# Patient Record
Sex: Female | Born: 1955 | Race: White | Hispanic: No | Smoking: Former smoker
Health system: Southern US, Community
[De-identification: ages and names within clinical notes are randomized; demographics above are authoritative.]

## PROBLEM LIST (undated history)

## (undated) DIAGNOSIS — Z952 Presence of prosthetic heart valve: Secondary | ICD-10-CM

## (undated) DIAGNOSIS — I493 Ventricular premature depolarization: Secondary | ICD-10-CM

## (undated) DIAGNOSIS — J189 Pneumonia, unspecified organism: Secondary | ICD-10-CM

## (undated) DIAGNOSIS — D696 Thrombocytopenia, unspecified: Secondary | ICD-10-CM

## (undated) DIAGNOSIS — I5032 Chronic diastolic (congestive) heart failure: Principal | ICD-10-CM

## (undated) DIAGNOSIS — R6510 Systemic inflammatory response syndrome (SIRS) of non-infectious origin without acute organ dysfunction: Principal | ICD-10-CM

## (undated) DIAGNOSIS — R0902 Hypoxemia: Secondary | ICD-10-CM

## (undated) DIAGNOSIS — R7881 Bacteremia: Secondary | ICD-10-CM

## (undated) DIAGNOSIS — I509 Heart failure, unspecified: Principal | ICD-10-CM

## (undated) DIAGNOSIS — J45909 Unspecified asthma, uncomplicated: Secondary | ICD-10-CM

## (undated) DIAGNOSIS — N179 Acute kidney failure, unspecified: Secondary | ICD-10-CM

## (undated) DIAGNOSIS — G4733 Obstructive sleep apnea (adult) (pediatric): Secondary | ICD-10-CM

## (undated) DIAGNOSIS — E119 Type 2 diabetes mellitus without complications: Secondary | ICD-10-CM

## (undated) DIAGNOSIS — D649 Anemia, unspecified: Secondary | ICD-10-CM

## (undated) DIAGNOSIS — I519 Heart disease, unspecified: Secondary | ICD-10-CM

## (undated) DIAGNOSIS — D493 Neoplasm of unspecified behavior of breast: Secondary | ICD-10-CM

## (undated) DIAGNOSIS — N189 Chronic kidney disease, unspecified: Secondary | ICD-10-CM

## (undated) DIAGNOSIS — Z950 Presence of cardiac pacemaker: Secondary | ICD-10-CM

## (undated) DIAGNOSIS — E039 Hypothyroidism, unspecified: Secondary | ICD-10-CM

## (undated) DIAGNOSIS — N200 Calculus of kidney: Secondary | ICD-10-CM

## (undated) DIAGNOSIS — J449 Chronic obstructive pulmonary disease, unspecified: Secondary | ICD-10-CM

## (undated) DIAGNOSIS — I5033 Acute on chronic diastolic (congestive) heart failure: Secondary | ICD-10-CM

## (undated) DIAGNOSIS — K7460 Unspecified cirrhosis of liver: Secondary | ICD-10-CM

## (undated) DIAGNOSIS — R011 Cardiac murmur, unspecified: Secondary | ICD-10-CM

## (undated) DIAGNOSIS — K7581 Nonalcoholic steatohepatitis (NASH): Secondary | ICD-10-CM

## (undated) HISTORY — DX: Type 2 diabetes mellitus without complications: E11.9

## (undated) HISTORY — PX: PACEMAKER INSERTION: SHX728

## (undated) HISTORY — DX: Heart disease, unspecified: I51.9

## (undated) HISTORY — DX: Neoplasm of unspecified behavior of breast: D49.3

## (undated) HISTORY — DX: Calculus of kidney: N20.0

## (undated) HISTORY — DX: Unspecified asthma, uncomplicated: J45.909

## (undated) HISTORY — PX: BACK SURGERY: SHX140

---
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
## 2005-01-28 HISTORY — PX: CHOLECYSTECTOMY: SHX55

## 2006-01-28 HISTORY — PX: OTHER SURGICAL HISTORY: SHX169

## 2007-01-29 HISTORY — PX: PHRENIC NERVE PACEMAKER IMPLANTATION: SHX2237

## 2009-03-24 ENCOUNTER — Ambulatory Visit: Payer: Self-pay | Admitting: Internal Medicine

## 2009-09-06 ENCOUNTER — Encounter: Payer: Self-pay | Admitting: Internal Medicine

## 2009-09-28 ENCOUNTER — Encounter: Payer: Self-pay | Admitting: Internal Medicine

## 2009-11-07 ENCOUNTER — Ambulatory Visit: Payer: Self-pay | Admitting: Rheumatology

## 2010-01-28 DIAGNOSIS — E119 Type 2 diabetes mellitus without complications: Secondary | ICD-10-CM

## 2010-01-28 HISTORY — DX: Type 2 diabetes mellitus without complications: E11.9

## 2010-02-28 ENCOUNTER — Emergency Department: Payer: Self-pay

## 2010-03-05 ENCOUNTER — Ambulatory Visit: Payer: Self-pay | Admitting: Internal Medicine

## 2010-03-15 ENCOUNTER — Emergency Department: Payer: Self-pay | Admitting: Emergency Medicine

## 2010-06-19 ENCOUNTER — Ambulatory Visit: Payer: Self-pay | Admitting: Internal Medicine

## 2010-09-24 ENCOUNTER — Emergency Department: Payer: Self-pay | Admitting: *Deleted

## 2011-01-29 DIAGNOSIS — D493 Neoplasm of unspecified behavior of breast: Secondary | ICD-10-CM

## 2011-01-29 HISTORY — DX: Neoplasm of unspecified behavior of breast: D49.3

## 2011-04-24 HISTORY — PX: BREAST BIOPSY: SHX20

## 2011-05-07 ENCOUNTER — Ambulatory Visit: Payer: Self-pay | Admitting: Emergency Medicine

## 2011-05-10 LAB — PATHOLOGY REPORT

## 2011-07-05 ENCOUNTER — Emergency Department: Payer: Self-pay | Admitting: *Deleted

## 2011-07-05 LAB — COMPREHENSIVE METABOLIC PANEL
Albumin: 3.3 g/dL — ABNORMAL LOW (ref 3.4–5.0)
Alkaline Phosphatase: 76 U/L (ref 50–136)
BUN: 12 mg/dL (ref 7–18)
Bilirubin,Total: 1 mg/dL (ref 0.2–1.0)
Calcium, Total: 8.9 mg/dL (ref 8.5–10.1)
Chloride: 102 mmol/L (ref 98–107)
Co2: 30 mmol/L (ref 21–32)
Creatinine: 1.1 mg/dL (ref 0.60–1.30)
EGFR (Non-African Amer.): 57 — ABNORMAL LOW
Potassium: 4 mmol/L (ref 3.5–5.1)
SGOT(AST): 32 U/L (ref 15–37)
Total Protein: 7.6 g/dL (ref 6.4–8.2)

## 2011-07-05 LAB — CBC
HGB: 10.9 g/dL — ABNORMAL LOW (ref 12.0–16.0)
RBC: 3.61 10*6/uL — ABNORMAL LOW (ref 3.80–5.20)
RDW: 14.8 % — ABNORMAL HIGH (ref 11.5–14.5)
WBC: 7 10*3/uL (ref 3.6–11.0)

## 2011-07-05 LAB — TROPONIN I: Troponin-I: <0.02 ng/mL

## 2011-07-05 LAB — PRO B NATRIURETIC PEPTIDE: B-Type Natriuretic Peptide: 652 pg/mL — ABNORMAL HIGH (ref 0–125)

## 2011-07-05 LAB — CK TOTAL AND CKMB (NOT AT ARMC)
CK, Total: 49 U/L (ref 21–215)
CK-MB: 0.9 ng/mL (ref 0.5–3.6)

## 2011-09-11 ENCOUNTER — Ambulatory Visit: Payer: Self-pay | Admitting: Internal Medicine

## 2011-09-17 DIAGNOSIS — D242 Benign neoplasm of left breast: Secondary | ICD-10-CM | POA: Insufficient documentation

## 2011-09-25 HISTORY — PX: BREAST SURGERY: SHX581

## 2012-01-03 ENCOUNTER — Other Ambulatory Visit: Payer: Self-pay | Admitting: Cardiovascular Disease

## 2012-01-03 ENCOUNTER — Ambulatory Visit: Payer: Self-pay | Admitting: Cardiovascular Disease

## 2012-01-03 LAB — POTASSIUM: Potassium: 4.1 mmol/L (ref 3.5–5.1)

## 2012-01-03 LAB — HEMATOCRIT: HCT: 32.5 % — ABNORMAL LOW (ref 35.0–47.0)

## 2012-01-03 LAB — CREATININE, SERUM
Creatinine: 1.41 mg/dL — ABNORMAL HIGH (ref 0.60–1.30)
EGFR (African American): 49 — ABNORMAL LOW

## 2012-01-06 ENCOUNTER — Ambulatory Visit: Payer: Self-pay | Admitting: Cardiovascular Disease

## 2012-03-28 ENCOUNTER — Encounter: Payer: Self-pay | Admitting: *Deleted

## 2012-05-05 ENCOUNTER — Ambulatory Visit: Payer: Self-pay | Admitting: General Surgery

## 2012-05-06 ENCOUNTER — Encounter: Payer: Self-pay | Admitting: General Surgery

## 2012-05-14 ENCOUNTER — Ambulatory Visit: Payer: Self-pay | Admitting: General Surgery

## 2012-06-29 ENCOUNTER — Ambulatory Visit: Payer: Self-pay | Admitting: General Surgery

## 2012-07-27 ENCOUNTER — Ambulatory Visit (INDEPENDENT_AMBULATORY_CARE_PROVIDER_SITE_OTHER): Payer: Medicare Other | Admitting: General Surgery

## 2012-07-27 ENCOUNTER — Encounter: Payer: Self-pay | Admitting: General Surgery

## 2012-07-27 VITALS — BP 122/66 | HR 66 | Resp 15 | Ht 67.5 in | Wt 353.0 lb

## 2012-07-27 DIAGNOSIS — D249 Benign neoplasm of unspecified breast: Secondary | ICD-10-CM

## 2012-07-27 DIAGNOSIS — D242 Benign neoplasm of left breast: Secondary | ICD-10-CM

## 2012-07-27 NOTE — Progress Notes (Signed)
Patient ID: Theresa Leon, female   DOB: [birth year], 56 y.o.   MRN: [id number]  Chief Complaint  Patient presents with  . Follow-up    mammogram    HPI Theresa Leon is a 57 y.o. female  6 month follow up mammogram [facility] 05/05/12. Patient had a Left breast biopsy August 2013 That was negative for malignancy. The patient performs self breast checks and get regular mammograms. She states she has right breast pain occasionally. The patient has a family history of breast cancer in a maternal grandmother. The most recent mammogram was done on 05/05/12 with a birad category 1.   HPI  Past Medical History  Diagnosis Date  . Asthma 1975  . Diabetes mellitus without complication 1478  . Heart disease   . Neoplasm of unspecified nature of breast 2013    papilloma, left breast     Past Surgical History  Procedure Laterality Date  . Back surgery      AS CHILD  . Phrenic nerve pacemaker implantation  2009  . Aortic valve relaced   2008  . Cholecystectomy  2007  . Breast surgery  September 25, 2011    intraductal papilloma of the left breast    Family History  Problem Relation Age of Onset  . Skin telangiectasia Mother   . Lung cancer Maternal Grandmother   . Lung cancer Paternal Grandmother   . Breast cancer Maternal Grandmother     Social History History  Substance Use Topics  . Smoking status: Former Research scientist (life sciences)  . Smokeless tobacco: Not on file  . Alcohol Use: No    Allergies  Allergen Reactions  . Mold Extract (Trichophyton)   . Nitroglycerin Other (See Comments)    Low Blood Pressure    Current Outpatient Prescriptions  Medication Sig Dispense Refill  . albuterol (PROVENTIL HFA;VENTOLIN HFA) 108 (90 BASE) MCG/ACT inhaler Inhale 2 puffs into the lungs every 6 (six) hours as needed for wheezing.      . Calcium Carbonate-Vitamin D (CALCIUM + D PO) Take 1 tablet by mouth daily.      . citalopram (CELEXA) 20 MG tablet Take 1 tablet by mouth 2 (two) times daily.      . diclofenac  (VOLTAREN) 75 MG EC tablet Take 75 mg by mouth 2 (two) times daily.      Marland Kitchen EPINEPHrine (EPIPEN) 0.3 mg/0.3 mL DEVI Inject into the muscle once.      . fluticasone (FLONASE) 50 MCG/ACT nasal spray Place 2 sprays into the nose daily as needed.      . gabapentin (NEURONTIN) 600 MG tablet Take 600 mg by mouth 3 (three) times daily.      . hydrochlorothiazide (HYDRODIURIL) 25 MG tablet Take 1 tablet by mouth daily.      Marland Kitchen HYDROcodone-acetaminophen (NORCO/VICODIN) 5-325 MG per tablet Take 1 tablet by mouth 2 (two) times daily.      Marland Kitchen levothyroxine (SYNTHROID, LEVOTHROID) 100 MCG tablet Take 100 mcg by mouth daily.      Marland Kitchen loratadine (CLARITIN) 10 MG tablet Take 10 mg by mouth daily.      . meclizine (ANTIVERT) 25 MG tablet Take 1 tablet by mouth every 6 (six) hours.      . montelukast (SINGULAIR) 10 MG tablet Take 10 mg by mouth at bedtime.      Marland Kitchen omeprazole (PRILOSEC) 40 MG capsule Take 40 mg by mouth daily.      . ranitidine (ZANTAC) 150 MG capsule Take 150 mg by mouth 2 (two) times daily.      Marland Kitchen  SOTALOL AF 80 MG TABS Take 1 tablet by mouth 2 (two) times daily.      . traZODone (DESYREL) 50 MG tablet Take 1 tablet by mouth at bedtime as needed.       No current facility-administered medications for this visit.    Review of Systems Review of Systems  Constitutional: Negative.   Respiratory: Negative.   Cardiovascular: Negative.     Blood pressure 122/66, pulse 66, resp. rate 15, height 5' 7.5" (1.715 m), weight 353 lb (160.12 kg).  Physical Exam Physical Exam  Constitutional: She appears well-developed and well-nourished.  Pulmonary/Chest: Right breast exhibits no inverted nipple, no mass, no nipple discharge, no skin change and no tenderness. Left breast exhibits no inverted nipple, no mass, no nipple discharge, no skin change and no tenderness.  Lymphadenopathy:    She has no cervical adenopathy.    She has no axillary adenopathy.  Neurological: She is alert.  Skin: Skin is warm and  dry.    Data Reviewed The May 05, 2012 left breast mammogram was reviewed. The retroareolar density previous identified as markedly diminished. Postbiopsy clip was evident. BI-RAD-1.  Assessment    Intraductal palpable left breast.     Plan    The patient should resume annual bilateral screening mammograms in fall 2014 with her primary care provider.        Robert Bellow 07/27/2012, 8:30 PM

## 2012-07-27 NOTE — Patient Instructions (Signed)
Patient to return as needed. 

## 2013-04-27 LAB — CBC
HCT: 35 % (ref 35.0–47.0)
HGB: 11.6 g/dL — ABNORMAL LOW (ref 12.0–16.0)
MCH: 29.8 pg (ref 26.0–34.0)
MCHC: 33 g/dL (ref 32.0–36.0)
MCV: 90 fL (ref 80–100)
Platelet: 125 10*3/uL — ABNORMAL LOW (ref 150–440)
RBC: 3.88 10*6/uL (ref 3.80–5.20)
RDW: 14.7 % — ABNORMAL HIGH (ref 11.5–14.5)
WBC: 9.7 10*3/uL (ref 3.6–11.0)

## 2013-04-27 LAB — URINALYSIS, COMPLETE
Bacteria: NONE SEEN
Bilirubin,UR: NEGATIVE
Blood: NEGATIVE
GLUCOSE, UR: NEGATIVE mg/dL (ref 0–75)
Ketone: NEGATIVE
Nitrite: NEGATIVE
Ph: 5 (ref 4.5–8.0)
Protein: 30
RBC,UR: NONE SEEN /HPF (ref 0–5)
Specific Gravity: 1.015 (ref 1.003–1.030)
WBC UR: 9 /HPF (ref 0–5)

## 2013-04-27 LAB — PRO B NATRIURETIC PEPTIDE: B-Type Natriuretic Peptide: 314 pg/mL — ABNORMAL HIGH (ref 0–125)

## 2013-04-27 LAB — HEPATIC FUNCTION PANEL A (ARMC)
ALK PHOS: 74 U/L
ALT: 32 U/L (ref 12–78)
AST: 33 U/L (ref 15–37)
Albumin: 3.4 g/dL (ref 3.4–5.0)
BILIRUBIN DIRECT: 0.2 mg/dL (ref 0.00–0.20)
Bilirubin,Total: 1.4 mg/dL — ABNORMAL HIGH (ref 0.2–1.0)
Total Protein: 8.1 g/dL (ref 6.4–8.2)

## 2013-04-27 LAB — LIPASE, BLOOD: LIPASE: 319 U/L (ref 73–393)

## 2013-04-27 LAB — BASIC METABOLIC PANEL
ANION GAP: 5 — AB (ref 7–16)
BUN: 33 mg/dL — ABNORMAL HIGH (ref 7–18)
Calcium, Total: 9.2 mg/dL (ref 8.5–10.1)
Chloride: 97 mmol/L — ABNORMAL LOW (ref 98–107)
Co2: 32 mmol/L (ref 21–32)
Creatinine: 3.22 mg/dL — ABNORMAL HIGH (ref 0.60–1.30)
EGFR (Non-African Amer.): 15 — ABNORMAL LOW
GFR CALC AF AMER: 18 — AB
Glucose: 176 mg/dL — ABNORMAL HIGH (ref 65–99)
Osmolality: 280 (ref 275–301)
POTASSIUM: 3.9 mmol/L (ref 3.5–5.1)
Sodium: 134 mmol/L — ABNORMAL LOW (ref 136–145)

## 2013-04-27 LAB — PROTIME-INR
INR: 1.1
Prothrombin Time: 14 secs (ref 11.5–14.7)

## 2013-04-27 LAB — TROPONIN I: Troponin-I: <0.02 ng/mL

## 2013-04-28 ENCOUNTER — Inpatient Hospital Stay: Payer: Self-pay | Admitting: Internal Medicine

## 2013-04-28 LAB — CK-MB
CK-MB: 0.9 ng/mL (ref 0.5–3.6)
CK-MB: 1.2 ng/mL (ref 0.5–3.6)

## 2013-04-28 LAB — MAGNESIUM
Magnesium: 1.1 mg/dL — ABNORMAL LOW
Magnesium: 2 mg/dL

## 2013-04-28 LAB — TROPONIN I: Troponin-I: <0.02 ng/mL

## 2013-04-28 LAB — GLUCOSE, RANDOM: Glucose: 210 mg/dL — ABNORMAL HIGH (ref 65–99)

## 2013-04-28 LAB — POTASSIUM: POTASSIUM: 4.3 mmol/L (ref 3.5–5.1)

## 2013-04-29 LAB — BASIC METABOLIC PANEL
ANION GAP: 5 — AB (ref 7–16)
BUN: 22 mg/dL — ABNORMAL HIGH (ref 7–18)
CALCIUM: 8.8 mg/dL (ref 8.5–10.1)
CHLORIDE: 100 mmol/L (ref 98–107)
CO2: 32 mmol/L (ref 21–32)
CREATININE: 1.51 mg/dL — AB (ref 0.60–1.30)
EGFR (African American): 44 — ABNORMAL LOW
EGFR (Non-African Amer.): 38 — ABNORMAL LOW
GLUCOSE: 175 mg/dL — AB (ref 65–99)
Osmolality: 281 (ref 275–301)
Potassium: 4.3 mmol/L (ref 3.5–5.1)
Sodium: 137 mmol/L (ref 136–145)

## 2013-04-29 LAB — UR PROT ELECTROPHORESIS, URINE RANDOM

## 2013-04-29 LAB — CBC WITH DIFFERENTIAL/PLATELET
BASOS PCT: 0.5 %
Basophil #: 0 10*3/uL (ref 0.0–0.1)
Eosinophil #: 0.2 10*3/uL (ref 0.0–0.7)
Eosinophil %: 3.4 %
HCT: 32.5 % — AB (ref 35.0–47.0)
HGB: 10.8 g/dL — ABNORMAL LOW (ref 12.0–16.0)
LYMPHS PCT: 45.3 %
Lymphocyte #: 3.2 10*3/uL (ref 1.0–3.6)
MCH: 29.8 pg (ref 26.0–34.0)
MCHC: 33.2 g/dL (ref 32.0–36.0)
MCV: 90 fL (ref 80–100)
MONO ABS: 0.5 x10 3/mm (ref 0.2–0.9)
Monocyte %: 7.2 %
Neutrophil #: 3.1 10*3/uL (ref 1.4–6.5)
Neutrophil %: 43.6 %
Platelet: 103 10*3/uL — ABNORMAL LOW (ref 150–440)
RBC: 3.61 10*6/uL — ABNORMAL LOW (ref 3.80–5.20)
RDW: 14.5 % (ref 11.5–14.5)
WBC: 7.2 10*3/uL (ref 3.6–11.0)

## 2013-04-29 LAB — MAGNESIUM: MAGNESIUM: 1.7 mg/dL — AB

## 2013-04-29 LAB — PROTEIN ELECTROPHORESIS(ARMC)

## 2013-07-13 ENCOUNTER — Inpatient Hospital Stay: Payer: Self-pay | Admitting: Internal Medicine

## 2013-07-13 LAB — CK TOTAL AND CKMB (NOT AT ARMC)
CK, Total: 24 U/L — ABNORMAL LOW
CK, Total: 24 U/L — ABNORMAL LOW
CK, Total: 23 U/L — ABNORMAL LOW
CK-MB: <0.5 ng/mL — ABNORMAL LOW (ref 0.5–3.6)
CK-MB: 0.7 ng/mL (ref 0.5–3.6)
CK-MB: 0.6 ng/mL (ref 0.5–3.6)

## 2013-07-13 LAB — COMPREHENSIVE METABOLIC PANEL
Albumin: 3.3 g/dL — ABNORMAL LOW (ref 3.4–5.0)
Alkaline Phosphatase: 58 U/L
Anion Gap: 5 — ABNORMAL LOW (ref 7–16)
BILIRUBIN TOTAL: 1 mg/dL (ref 0.2–1.0)
BUN: 47 mg/dL — AB (ref 7–18)
CO2: 26 mmol/L (ref 21–32)
Calcium, Total: 8.3 mg/dL — ABNORMAL LOW (ref 8.5–10.1)
Chloride: 107 mmol/L (ref 98–107)
Creatinine: 3.44 mg/dL — ABNORMAL HIGH (ref 0.60–1.30)
GFR CALC AF AMER: 16 — AB
GFR CALC NON AF AMER: 14 — AB
Glucose: 151 mg/dL — ABNORMAL HIGH (ref 65–99)
Osmolality: 291 (ref 275–301)
Potassium: 5.4 mmol/L — ABNORMAL HIGH (ref 3.5–5.1)
SGOT(AST): 30 U/L (ref 15–37)
SGPT (ALT): 30 U/L (ref 12–78)
Sodium: 138 mmol/L (ref 136–145)
Total Protein: 7 g/dL (ref 6.4–8.2)

## 2013-07-13 LAB — CBC
HCT: 32.6 % — ABNORMAL LOW (ref 35.0–47.0)
HGB: 10.5 g/dL — AB (ref 12.0–16.0)
MCH: 30.4 pg (ref 26.0–34.0)
MCHC: 32.2 g/dL (ref 32.0–36.0)
MCV: 95 fL (ref 80–100)
Platelet: 111 10*3/uL — ABNORMAL LOW (ref 150–440)
RBC: 3.44 10*6/uL — ABNORMAL LOW (ref 3.80–5.20)
RDW: 16.1 % — ABNORMAL HIGH (ref 11.5–14.5)
WBC: 8.9 10*3/uL (ref 3.6–11.0)

## 2013-07-13 LAB — TROPONIN I
Troponin-I: <0.02 ng/mL
Troponin-I: <0.02 ng/mL
Troponin-I: <0.02 ng/mL

## 2013-07-13 LAB — PROTIME-INR
INR: 1.1
PROTHROMBIN TIME: 14.3 s (ref 11.5–14.7)

## 2013-07-13 LAB — APTT: ACTIVATED PTT: 32.8 s (ref 23.6–35.9)

## 2013-07-14 LAB — CBC WITH DIFFERENTIAL/PLATELET
Basophil #: 0.1 10*3/uL (ref 0.0–0.1)
Basophil %: 1.2 %
Eosinophil #: 0.4 10*3/uL (ref 0.0–0.7)
Eosinophil %: 5.1 %
HCT: 30.6 % — ABNORMAL LOW (ref 35.0–47.0)
HGB: 10 g/dL — AB (ref 12.0–16.0)
LYMPHS PCT: 47.3 %
Lymphocyte #: 3.6 10*3/uL (ref 1.0–3.6)
MCH: 31.1 pg (ref 26.0–34.0)
MCHC: 32.5 g/dL (ref 32.0–36.0)
MCV: 96 fL (ref 80–100)
MONO ABS: 0.6 x10 3/mm (ref 0.2–0.9)
Monocyte %: 7.3 %
NEUTROS ABS: 3 10*3/uL (ref 1.4–6.5)
NEUTROS PCT: 39.1 %
Platelet: 93 10*3/uL — ABNORMAL LOW (ref 150–440)
RBC: 3.2 10*6/uL — ABNORMAL LOW (ref 3.80–5.20)
RDW: 15.8 % — ABNORMAL HIGH (ref 11.5–14.5)
WBC: 7.7 10*3/uL (ref 3.6–11.0)

## 2013-07-14 LAB — BASIC METABOLIC PANEL
ANION GAP: 5 — AB (ref 7–16)
BUN: 41 mg/dL — ABNORMAL HIGH (ref 7–18)
Calcium, Total: 7.6 mg/dL — ABNORMAL LOW (ref 8.5–10.1)
Chloride: 109 mmol/L — ABNORMAL HIGH (ref 98–107)
Co2: 26 mmol/L (ref 21–32)
Creatinine: 2.39 mg/dL — ABNORMAL HIGH (ref 0.60–1.30)
EGFR (Non-African Amer.): 22 — ABNORMAL LOW
GFR CALC AF AMER: 25 — AB
Glucose: 109 mg/dL — ABNORMAL HIGH (ref 65–99)
OSMOLALITY: 290 (ref 275–301)
Potassium: 5.5 mmol/L — ABNORMAL HIGH (ref 3.5–5.1)
Sodium: 140 mmol/L (ref 136–145)

## 2013-07-14 LAB — POTASSIUM
Potassium: 5.5 mmol/L — ABNORMAL HIGH (ref 3.5–5.1)
Potassium: 5.5 mmol/L — ABNORMAL HIGH (ref 3.5–5.1)

## 2013-07-14 LAB — HEMOGLOBIN A1C: HEMOGLOBIN A1C: 8.3 % — AB (ref 4.2–6.3)

## 2013-07-14 LAB — CLOSTRIDIUM DIFFICILE(ARMC)

## 2013-07-15 LAB — BASIC METABOLIC PANEL
Anion Gap: 2 — ABNORMAL LOW (ref 7–16)
BUN: 16 mg/dL (ref 7–18)
CALCIUM: 8.2 mg/dL — AB (ref 8.5–10.1)
CHLORIDE: 109 mmol/L — AB (ref 98–107)
Co2: 28 mmol/L (ref 21–32)
Creatinine: 1.11 mg/dL (ref 0.60–1.30)
EGFR (African American): >60
GFR CALC NON AF AMER: 55 — AB
GLUCOSE: 128 mg/dL — AB (ref 65–99)
Osmolality: 280 (ref 275–301)
Potassium: 5.2 mmol/L — ABNORMAL HIGH (ref 3.5–5.1)
Sodium: 139 mmol/L (ref 136–145)

## 2013-07-15 LAB — CBC WITH DIFFERENTIAL/PLATELET
Basophil #: 0 10*3/uL (ref 0.0–0.1)
Basophil #: 0 10*3/uL (ref 0.0–0.1)
Basophil %: 0.8 %
Basophil %: 0.6 %
EOS ABS: 0.2 10*3/uL (ref 0.0–0.7)
EOS PCT: 3.6 %
Eosinophil #: 0.2 10*3/uL (ref 0.0–0.7)
Eosinophil %: 4 %
HCT: 30.7 % — ABNORMAL LOW (ref 35.0–47.0)
HCT: 28.9 % — AB (ref 35.0–47.0)
HGB: 9.9 g/dL — ABNORMAL LOW (ref 12.0–16.0)
HGB: 9.2 g/dL — ABNORMAL LOW (ref 12.0–16.0)
Lymphocyte #: 2.8 10*3/uL (ref 1.0–3.6)
Lymphocyte #: 2.6 10*3/uL (ref 1.0–3.6)
Lymphocyte %: 46.4 %
Lymphocyte %: 43.7 %
MCH: 30.5 pg (ref 26.0–34.0)
MCH: 30 pg (ref 26.0–34.0)
MCHC: 32.1 g/dL (ref 32.0–36.0)
MCHC: 31.8 g/dL — ABNORMAL LOW (ref 32.0–36.0)
MCV: 95 fL (ref 80–100)
MCV: 95 fL (ref 80–100)
Monocyte #: 0.4 x10 3/mm (ref 0.2–0.9)
Monocyte #: 0.5 x10 3/mm (ref 0.2–0.9)
Monocyte %: 7.1 %
Monocyte %: 8.6 %
NEUTROS ABS: 2.5 10*3/uL (ref 1.4–6.5)
NEUTROS PCT: 41.7 %
NEUTROS PCT: 43.5 %
Neutrophil #: 2.6 10*3/uL (ref 1.4–6.5)
Platelet: 85 10*3/uL — ABNORMAL LOW (ref 150–440)
Platelet: 76 10*3/uL — ABNORMAL LOW (ref 150–440)
RBC: 3.24 10*6/uL — ABNORMAL LOW (ref 3.80–5.20)
RBC: 3.06 10*6/uL — ABNORMAL LOW (ref 3.80–5.20)
RDW: 15.1 % — ABNORMAL HIGH (ref 11.5–14.5)
RDW: 15.4 % — ABNORMAL HIGH (ref 11.5–14.5)
WBC: 6.1 10*3/uL (ref 3.6–11.0)
WBC: 5.9 10*3/uL (ref 3.6–11.0)

## 2013-07-15 LAB — ALBUMIN: ALBUMIN: 3.1 g/dL — AB (ref 3.4–5.0)

## 2013-07-15 LAB — POTASSIUM
Potassium: 5 mmol/L (ref 3.5–5.1)
Potassium: 4.8 mmol/L (ref 3.5–5.1)

## 2013-09-28 LAB — LIPASE, BLOOD: LIPASE: 123 U/L (ref 73–393)

## 2013-09-28 LAB — COMPREHENSIVE METABOLIC PANEL
ANION GAP: 1 — AB (ref 7–16)
Albumin: 2.4 g/dL — ABNORMAL LOW (ref 3.4–5.0)
Alkaline Phosphatase: 63 U/L
BILIRUBIN TOTAL: 1.4 mg/dL — AB (ref 0.2–1.0)
BUN: 38 mg/dL — ABNORMAL HIGH (ref 7–18)
CALCIUM: 8.1 mg/dL — AB (ref 8.5–10.1)
CHLORIDE: 107 mmol/L (ref 98–107)
Co2: 23 mmol/L (ref 21–32)
Creatinine: 3.54 mg/dL — ABNORMAL HIGH (ref 0.60–1.30)
EGFR (Non-African Amer.): 14 — ABNORMAL LOW
GFR CALC AF AMER: 16 — AB
GLUCOSE: 151 mg/dL — AB (ref 65–99)
Osmolality: 275 (ref 275–301)
Potassium: 4 mmol/L (ref 3.5–5.1)
SGOT(AST): 17 U/L (ref 15–37)
SGPT (ALT): 16 U/L
SODIUM: 131 mmol/L — AB (ref 136–145)
Total Protein: 6.2 g/dL — ABNORMAL LOW (ref 6.4–8.2)

## 2013-09-28 LAB — CBC WITH DIFFERENTIAL/PLATELET
BASOS PCT: 0.3 %
Basophil #: 0 10*3/uL (ref 0.0–0.1)
Eosinophil #: 0.1 10*3/uL (ref 0.0–0.7)
Eosinophil %: 0.5 %
HCT: 27.6 % — AB (ref 35.0–47.0)
HGB: 8.9 g/dL — ABNORMAL LOW (ref 12.0–16.0)
LYMPHS PCT: 18.7 %
Lymphocyte #: 2.9 10*3/uL (ref 1.0–3.6)
MCH: 29.7 pg (ref 26.0–34.0)
MCHC: 32.1 g/dL (ref 32.0–36.0)
MCV: 93 fL (ref 80–100)
MONO ABS: 1.1 x10 3/mm — AB (ref 0.2–0.9)
Monocyte %: 7.3 %
NEUTROS PCT: 73.2 %
Neutrophil #: 11.2 10*3/uL — ABNORMAL HIGH (ref 1.4–6.5)
PLATELETS: 98 10*3/uL — AB (ref 150–440)
RBC: 2.98 10*6/uL — ABNORMAL LOW (ref 3.80–5.20)
RDW: 15.8 % — AB (ref 11.5–14.5)
WBC: 15.3 10*3/uL — ABNORMAL HIGH (ref 3.6–11.0)

## 2013-09-28 LAB — TROPONIN I: Troponin-I: <0.02 ng/mL

## 2013-09-29 ENCOUNTER — Inpatient Hospital Stay: Payer: Self-pay | Admitting: Specialist

## 2013-09-29 LAB — BASIC METABOLIC PANEL
ANION GAP: 8 (ref 7–16)
ANION GAP: 5 — AB (ref 7–16)
ANION GAP: 6 — AB (ref 7–16)
Anion Gap: 3 — ABNORMAL LOW (ref 7–16)
Anion Gap: 6 — ABNORMAL LOW (ref 7–16)
Anion Gap: 7 (ref 7–16)
BUN: 45 mg/dL — AB (ref 7–18)
BUN: 39 mg/dL — ABNORMAL HIGH (ref 7–18)
BUN: 37 mg/dL — ABNORMAL HIGH (ref 7–18)
BUN: 35 mg/dL — AB (ref 7–18)
BUN: 36 mg/dL — AB (ref 7–18)
BUN: 32 mg/dL — AB (ref 7–18)
CALCIUM: 8.6 mg/dL (ref 8.5–10.1)
CALCIUM: 8.7 mg/dL (ref 8.5–10.1)
CALCIUM: 8.5 mg/dL (ref 8.5–10.1)
CHLORIDE: 107 mmol/L (ref 98–107)
CHLORIDE: 107 mmol/L (ref 98–107)
CHLORIDE: 106 mmol/L (ref 98–107)
CO2: 23 mmol/L (ref 21–32)
CREATININE: 3.68 mg/dL — AB (ref 0.60–1.30)
CREATININE: 2.42 mg/dL — AB (ref 0.60–1.30)
CREATININE: 2.19 mg/dL — AB (ref 0.60–1.30)
Calcium, Total: 8.1 mg/dL — ABNORMAL LOW (ref 8.5–10.1)
Calcium, Total: 8 mg/dL — ABNORMAL LOW (ref 8.5–10.1)
Calcium, Total: 8.5 mg/dL (ref 8.5–10.1)
Chloride: 104 mmol/L (ref 98–107)
Chloride: 106 mmol/L (ref 98–107)
Chloride: 108 mmol/L — ABNORMAL HIGH (ref 98–107)
Co2: 24 mmol/L (ref 21–32)
Co2: 25 mmol/L (ref 21–32)
Co2: 24 mmol/L (ref 21–32)
Co2: 24 mmol/L (ref 21–32)
Co2: 24 mmol/L (ref 21–32)
Creatinine: 2.7 mg/dL — ABNORMAL HIGH (ref 0.60–1.30)
Creatinine: 2.26 mg/dL — ABNORMAL HIGH (ref 0.60–1.30)
Creatinine: 2.02 mg/dL — ABNORMAL HIGH (ref 0.60–1.30)
EGFR (African American): 15 — ABNORMAL LOW
EGFR (African American): 22 — ABNORMAL LOW
EGFR (African American): 25 — ABNORMAL LOW
EGFR (Non-African Amer.): 13 — ABNORMAL LOW
EGFR (Non-African Amer.): 19 — ABNORMAL LOW
EGFR (Non-African Amer.): 22 — ABNORMAL LOW
EGFR (Non-African Amer.): 23 — ABNORMAL LOW
EGFR (Non-African Amer.): 24 — ABNORMAL LOW
EGFR (Non-African Amer.): 27 — ABNORMAL LOW
GFR CALC AF AMER: 27 — AB
GFR CALC AF AMER: 28 — AB
GFR CALC AF AMER: 31 — AB
GLUCOSE: 119 mg/dL — AB (ref 65–99)
Glucose: 183 mg/dL — ABNORMAL HIGH (ref 65–99)
Glucose: 135 mg/dL — ABNORMAL HIGH (ref 65–99)
Glucose: 124 mg/dL — ABNORMAL HIGH (ref 65–99)
Glucose: 127 mg/dL — ABNORMAL HIGH (ref 65–99)
Glucose: 135 mg/dL — ABNORMAL HIGH (ref 65–99)
OSMOLALITY: 288 (ref 275–301)
OSMOLALITY: 283 (ref 275–301)
Osmolality: 282 (ref 275–301)
Osmolality: 280 (ref 275–301)
Osmolality: 283 (ref 275–301)
Osmolality: 283 (ref 275–301)
POTASSIUM: 4.3 mmol/L (ref 3.5–5.1)
POTASSIUM: 4.3 mmol/L (ref 3.5–5.1)
Potassium: 4.5 mmol/L (ref 3.5–5.1)
Potassium: 4.8 mmol/L (ref 3.5–5.1)
Potassium: 4.4 mmol/L (ref 3.5–5.1)
Potassium: 4.5 mmol/L (ref 3.5–5.1)
SODIUM: 135 mmol/L — AB (ref 136–145)
SODIUM: 137 mmol/L (ref 136–145)
SODIUM: 137 mmol/L (ref 136–145)
Sodium: 136 mmol/L (ref 136–145)
Sodium: 135 mmol/L — ABNORMAL LOW (ref 136–145)
Sodium: 137 mmol/L (ref 136–145)

## 2013-09-29 LAB — URINALYSIS, COMPLETE
Bilirubin,UR: NEGATIVE
Glucose,UR: NEGATIVE mg/dL (ref 0–75)
KETONE: NEGATIVE
NITRITE: NEGATIVE
PH: 5 (ref 4.5–8.0)
RBC,UR: 16 /HPF (ref 0–5)
Specific Gravity: 1.023 (ref 1.003–1.030)
Squamous Epithelial: 1
WBC UR: 17 /HPF (ref 0–5)

## 2013-09-30 LAB — CBC WITH DIFFERENTIAL/PLATELET
BASOS ABS: 0 10*3/uL (ref 0.0–0.1)
Basophil %: 0.3 %
Eosinophil #: 0.3 10*3/uL (ref 0.0–0.7)
Eosinophil %: 1.8 %
HCT: 26 % — ABNORMAL LOW (ref 35.0–47.0)
HGB: 8.4 g/dL — AB (ref 12.0–16.0)
LYMPHS PCT: 15.1 %
Lymphocyte #: 2.3 10*3/uL (ref 1.0–3.6)
MCH: 29.7 pg (ref 26.0–34.0)
MCHC: 32.5 g/dL (ref 32.0–36.0)
MCV: 92 fL (ref 80–100)
MONO ABS: 1.5 x10 3/mm — AB (ref 0.2–0.9)
MONOS PCT: 9.8 %
NEUTROS PCT: 73 %
Neutrophil #: 11 10*3/uL — ABNORMAL HIGH (ref 1.4–6.5)
PLATELETS: 102 10*3/uL — AB (ref 150–440)
RBC: 2.84 10*6/uL — AB (ref 3.80–5.20)
RDW: 15.9 % — ABNORMAL HIGH (ref 11.5–14.5)
WBC: 15 10*3/uL — ABNORMAL HIGH (ref 3.6–11.0)

## 2013-09-30 LAB — PATHOLOGY REPORT

## 2013-09-30 LAB — BASIC METABOLIC PANEL
ANION GAP: 7 (ref 7–16)
ANION GAP: 6 — AB (ref 7–16)
Anion Gap: 7 (ref 7–16)
BUN: 23 mg/dL — ABNORMAL HIGH (ref 7–18)
BUN: 25 mg/dL — ABNORMAL HIGH (ref 7–18)
BUN: 31 mg/dL — ABNORMAL HIGH (ref 7–18)
CHLORIDE: 105 mmol/L (ref 98–107)
CHLORIDE: 106 mmol/L (ref 98–107)
CHLORIDE: 106 mmol/L (ref 98–107)
CREATININE: 1.56 mg/dL — AB (ref 0.60–1.30)
CREATININE: 1.42 mg/dL — AB (ref 0.60–1.30)
Calcium, Total: 8.4 mg/dL — ABNORMAL LOW (ref 8.5–10.1)
Calcium, Total: 8.7 mg/dL (ref 8.5–10.1)
Calcium, Total: 8.7 mg/dL (ref 8.5–10.1)
Co2: 24 mmol/L (ref 21–32)
Co2: 25 mmol/L (ref 21–32)
Co2: 26 mmol/L (ref 21–32)
Creatinine: 1.88 mg/dL — ABNORMAL HIGH (ref 0.60–1.30)
EGFR (African American): 34 — ABNORMAL LOW
EGFR (African American): 43 — ABNORMAL LOW
EGFR (African American): 48 — ABNORMAL LOW
EGFR (Non-African Amer.): 37 — ABNORMAL LOW
GFR CALC NON AF AMER: 29 — AB
GFR CALC NON AF AMER: 41 — AB
GLUCOSE: 137 mg/dL — AB (ref 65–99)
Glucose: 137 mg/dL — ABNORMAL HIGH (ref 65–99)
Glucose: 172 mg/dL — ABNORMAL HIGH (ref 65–99)
OSMOLALITY: 284 (ref 275–301)
Osmolality: 280 (ref 275–301)
Osmolality: 283 (ref 275–301)
Potassium: 4 mmol/L (ref 3.5–5.1)
Potassium: 4.2 mmol/L (ref 3.5–5.1)
Potassium: 4.4 mmol/L (ref 3.5–5.1)
SODIUM: 139 mmol/L (ref 136–145)
Sodium: 136 mmol/L (ref 136–145)
Sodium: 137 mmol/L (ref 136–145)

## 2013-09-30 LAB — PHOSPHORUS: PHOSPHORUS: 2.2 mg/dL — AB (ref 2.5–4.9)

## 2013-10-01 LAB — BASIC METABOLIC PANEL
Anion Gap: 8 (ref 7–16)
BUN: 19 mg/dL — ABNORMAL HIGH (ref 7–18)
CHLORIDE: 105 mmol/L (ref 98–107)
CREATININE: 1.27 mg/dL (ref 0.60–1.30)
Calcium, Total: 8.4 mg/dL — ABNORMAL LOW (ref 8.5–10.1)
Co2: 28 mmol/L (ref 21–32)
GFR CALC AF AMER: 55 — AB
GFR CALC NON AF AMER: 47 — AB
GLUCOSE: 117 mg/dL — AB (ref 65–99)
Osmolality: 285 (ref 275–301)
POTASSIUM: 4.2 mmol/L (ref 3.5–5.1)
Sodium: 141 mmol/L (ref 136–145)

## 2013-10-01 LAB — CBC WITH DIFFERENTIAL/PLATELET
Basophil #: 0 10*3/uL (ref 0.0–0.1)
Basophil %: 0.4 %
EOS ABS: 0.1 10*3/uL (ref 0.0–0.7)
Eosinophil %: 1.5 %
HCT: 22.9 % — ABNORMAL LOW (ref 35.0–47.0)
HGB: 7.4 g/dL — AB (ref 12.0–16.0)
Lymphocyte #: 1.4 10*3/uL (ref 1.0–3.6)
Lymphocyte %: 17.2 %
MCH: 29.7 pg (ref 26.0–34.0)
MCHC: 32.4 g/dL (ref 32.0–36.0)
MCV: 91 fL (ref 80–100)
Monocyte #: 1 x10 3/mm — ABNORMAL HIGH (ref 0.2–0.9)
Monocyte %: 12.2 %
NEUTROS PCT: 68.7 %
Neutrophil #: 5.8 10*3/uL (ref 1.4–6.5)
Platelet: 84 10*3/uL — ABNORMAL LOW (ref 150–440)
RBC: 2.51 10*6/uL — ABNORMAL LOW (ref 3.80–5.20)
RDW: 15.9 % — ABNORMAL HIGH (ref 11.5–14.5)
WBC: 8.4 10*3/uL (ref 3.6–11.0)

## 2013-10-01 LAB — MAGNESIUM: MAGNESIUM: 1.2 mg/dL — AB

## 2013-10-01 LAB — PHOSPHORUS: PHOSPHORUS: 2.5 mg/dL (ref 2.5–4.9)

## 2013-10-01 LAB — VANCOMYCIN, TROUGH: Vancomycin, Trough: 7 ug/mL — ABNORMAL LOW (ref 10–20)

## 2013-10-02 LAB — BASIC METABOLIC PANEL
Anion Gap: 7 (ref 7–16)
BUN: 15 mg/dL (ref 7–18)
CREATININE: 1.07 mg/dL (ref 0.60–1.30)
Calcium, Total: 8.1 mg/dL — ABNORMAL LOW (ref 8.5–10.1)
Chloride: 103 mmol/L (ref 98–107)
Co2: 29 mmol/L (ref 21–32)
EGFR (Non-African Amer.): 58 — ABNORMAL LOW
Glucose: 199 mg/dL — ABNORMAL HIGH (ref 65–99)
Osmolality: 284 (ref 275–301)
Potassium: 4.2 mmol/L (ref 3.5–5.1)
Sodium: 139 mmol/L (ref 136–145)

## 2013-10-02 LAB — CBC WITH DIFFERENTIAL/PLATELET
BASOS ABS: 0.1 10*3/uL (ref 0.0–0.1)
Basophil %: 2.8 %
Eosinophil #: 0 10*3/uL (ref 0.0–0.7)
Eosinophil %: 0.1 %
HCT: 23.7 % — AB (ref 35.0–47.0)
HGB: 7.6 g/dL — AB (ref 12.0–16.0)
LYMPHS PCT: 13.2 %
Lymphocyte #: 0.6 10*3/uL — ABNORMAL LOW (ref 1.0–3.6)
MCH: 29.4 pg (ref 26.0–34.0)
MCHC: 32 g/dL (ref 32.0–36.0)
MCV: 92 fL (ref 80–100)
MONOS PCT: 6.5 %
Monocyte #: 0.3 x10 3/mm (ref 0.2–0.9)
Neutrophil #: 3.8 10*3/uL (ref 1.4–6.5)
Neutrophil %: 77.4 %
PLATELETS: 88 10*3/uL — AB (ref 150–440)
RBC: 2.58 10*6/uL — AB (ref 3.80–5.20)
RDW: 16 % — AB (ref 11.5–14.5)
WBC: 4.9 10*3/uL (ref 3.6–11.0)

## 2013-10-02 LAB — MAGNESIUM
Magnesium: 1.4 mg/dL — ABNORMAL LOW
Magnesium: 1.6 mg/dL — ABNORMAL LOW

## 2013-10-02 LAB — CLOSTRIDIUM DIFFICILE(ARMC)

## 2013-10-03 LAB — CBC WITH DIFFERENTIAL/PLATELET
BASOS ABS: 0.1 10*3/uL (ref 0.0–0.1)
BASOS PCT: 0.6 %
Eosinophil #: 0.2 10*3/uL (ref 0.0–0.7)
Eosinophil %: 2.1 %
HCT: 26.9 % — AB (ref 35.0–47.0)
HGB: 8.7 g/dL — ABNORMAL LOW (ref 12.0–16.0)
Lymphocyte #: 2 10*3/uL (ref 1.0–3.6)
Lymphocyte %: 23.7 %
MCH: 29.8 pg (ref 26.0–34.0)
MCHC: 32.2 g/dL (ref 32.0–36.0)
MCV: 93 fL (ref 80–100)
MONO ABS: 0.9 x10 3/mm (ref 0.2–0.9)
MONOS PCT: 10.3 %
NEUTROS ABS: 5.2 10*3/uL (ref 1.4–6.5)
Neutrophil %: 63.3 %
PLATELETS: 130 10*3/uL — AB (ref 150–440)
RBC: 2.91 10*6/uL — ABNORMAL LOW (ref 3.80–5.20)
RDW: 16.2 % — ABNORMAL HIGH (ref 11.5–14.5)
WBC: 8.2 10*3/uL (ref 3.6–11.0)

## 2013-10-03 LAB — BASIC METABOLIC PANEL
Anion Gap: 5 — ABNORMAL LOW (ref 7–16)
BUN: 17 mg/dL (ref 7–18)
CALCIUM: 8.5 mg/dL (ref 8.5–10.1)
CREATININE: 1.22 mg/dL (ref 0.60–1.30)
Chloride: 104 mmol/L (ref 98–107)
Co2: 30 mmol/L (ref 21–32)
EGFR (African American): 57 — ABNORMAL LOW
EGFR (Non-African Amer.): 49 — ABNORMAL LOW
GLUCOSE: 161 mg/dL — AB (ref 65–99)
Osmolality: 283 (ref 275–301)
POTASSIUM: 4 mmol/L (ref 3.5–5.1)
Sodium: 139 mmol/L (ref 136–145)

## 2013-10-03 LAB — CULTURE, BLOOD (SINGLE)

## 2013-10-04 LAB — VANCOMYCIN, TROUGH: Vancomycin, Trough: 18 ug/mL (ref 10–20)

## 2013-10-04 LAB — WOUND CULTURE

## 2013-10-05 LAB — CBC WITH DIFFERENTIAL/PLATELET
Basophil #: 0.1 10*3/uL (ref 0.0–0.1)
Basophil %: 0.9 %
EOS ABS: 0.4 10*3/uL (ref 0.0–0.7)
EOS PCT: 5.3 %
HCT: 28.4 % — AB (ref 35.0–47.0)
HGB: 9 g/dL — ABNORMAL LOW (ref 12.0–16.0)
LYMPHS ABS: 1.9 10*3/uL (ref 1.0–3.6)
LYMPHS PCT: 27.8 %
MCH: 29.1 pg (ref 26.0–34.0)
MCHC: 31.6 g/dL — AB (ref 32.0–36.0)
MCV: 92 fL (ref 80–100)
MONO ABS: 0.5 x10 3/mm (ref 0.2–0.9)
Monocyte %: 7 %
Neutrophil #: 4.1 10*3/uL (ref 1.4–6.5)
Neutrophil %: 59 %
Platelet: 150 10*3/uL (ref 150–440)
RBC: 3.09 10*6/uL — AB (ref 3.80–5.20)
RDW: 15.9 % — AB (ref 11.5–14.5)
WBC: 7 10*3/uL (ref 3.6–11.0)

## 2013-11-29 ENCOUNTER — Encounter: Payer: Self-pay | Admitting: General Surgery

## 2014-01-05 ENCOUNTER — Encounter: Payer: Self-pay | Admitting: Internal Medicine

## 2014-01-16 ENCOUNTER — Inpatient Hospital Stay: Payer: Self-pay | Admitting: Internal Medicine

## 2014-01-16 LAB — URINALYSIS, COMPLETE
BLOOD: NEGATIVE
Bilirubin,UR: NEGATIVE
Glucose,UR: NEGATIVE mg/dL (ref 0–75)
Hyaline Cast: 10
KETONE: NEGATIVE
Nitrite: NEGATIVE
Ph: 5 (ref 4.5–8.0)
Protein: 100
RBC,UR: NONE SEEN /HPF (ref 0–5)
SPECIFIC GRAVITY: 1.025 (ref 1.003–1.030)
WBC UR: 28 /HPF (ref 0–5)

## 2014-01-16 LAB — CBC
HCT: 23.4 % — AB (ref 35.0–47.0)
HGB: 6.8 g/dL — ABNORMAL LOW (ref 12.0–16.0)
MCH: 24.5 pg — ABNORMAL LOW (ref 26.0–34.0)
MCHC: 29.2 g/dL — ABNORMAL LOW (ref 32.0–36.0)
MCV: 84 fL (ref 80–100)
Platelet: 119 10*3/uL — ABNORMAL LOW (ref 150–440)
RBC: 2.79 10*6/uL — AB (ref 3.80–5.20)
RDW: 17.6 % — AB (ref 11.5–14.5)
WBC: 7.5 10*3/uL (ref 3.6–11.0)

## 2014-01-16 LAB — BASIC METABOLIC PANEL
ANION GAP: 5 — AB (ref 7–16)
BUN: 66 mg/dL — ABNORMAL HIGH (ref 7–18)
CALCIUM: 8.4 mg/dL — AB (ref 8.5–10.1)
Chloride: 112 mmol/L — ABNORMAL HIGH (ref 98–107)
Co2: 23 mmol/L (ref 21–32)
Creatinine: 4.77 mg/dL — ABNORMAL HIGH (ref 0.60–1.30)
EGFR (African American): 12 — ABNORMAL LOW
GFR CALC NON AF AMER: 10 — AB
GLUCOSE: 116 mg/dL — AB (ref 65–99)
OSMOLALITY: 299 (ref 275–301)
POTASSIUM: 6.6 mmol/L — AB (ref 3.5–5.1)
SODIUM: 140 mmol/L (ref 136–145)

## 2014-01-16 LAB — FERRITIN: Ferritin (ARMC): 6 ng/mL — ABNORMAL LOW (ref 8–388)

## 2014-01-16 LAB — IRON AND TIBC
IRON BIND. CAP.(TOTAL): 478 ug/dL — AB (ref 250–450)
IRON: 26 ug/dL — AB (ref 50–170)
Iron Saturation: 5 %
Unbound Iron-Bind.Cap.: 452 ug/dL

## 2014-01-16 LAB — TSH: Thyroid Stimulating Horm: 2.27 u[IU]/mL

## 2014-01-16 LAB — PRO B NATRIURETIC PEPTIDE: B-Type Natriuretic Peptide: 7178 pg/mL — ABNORMAL HIGH (ref 0–125)

## 2014-01-16 LAB — TROPONIN I: TROPONIN-I: 0.04 ng/mL

## 2014-01-17 DIAGNOSIS — I340 Nonrheumatic mitral (valve) insufficiency: Secondary | ICD-10-CM

## 2014-01-17 LAB — COMPREHENSIVE METABOLIC PANEL
ANION GAP: 3 — AB (ref 7–16)
Albumin: 3.2 g/dL — ABNORMAL LOW (ref 3.4–5.0)
Alkaline Phosphatase: 76 U/L
BILIRUBIN TOTAL: 1.1 mg/dL — AB (ref 0.2–1.0)
BUN: 69 mg/dL — AB (ref 7–18)
CALCIUM: 8.1 mg/dL — AB (ref 8.5–10.1)
CREATININE: 5.09 mg/dL — AB (ref 0.60–1.30)
Chloride: 109 mmol/L — ABNORMAL HIGH (ref 98–107)
Co2: 26 mmol/L (ref 21–32)
EGFR (African American): 11 — ABNORMAL LOW
GFR CALC NON AF AMER: 9 — AB
GLUCOSE: 162 mg/dL — AB (ref 65–99)
OSMOLALITY: 299 (ref 275–301)
Potassium: 6.8 mmol/L (ref 3.5–5.1)
SGOT(AST): 20 U/L (ref 15–37)
SGPT (ALT): 16 U/L
SODIUM: 138 mmol/L (ref 136–145)
TOTAL PROTEIN: 7.5 g/dL (ref 6.4–8.2)

## 2014-01-17 LAB — CBC WITH DIFFERENTIAL/PLATELET
BASOS ABS: 0.1 10*3/uL (ref 0.0–0.1)
Basophil %: 0.7 %
EOS ABS: 0.7 10*3/uL (ref 0.0–0.7)
Eosinophil %: 5.8 %
HCT: 27.6 % — ABNORMAL LOW (ref 35.0–47.0)
HGB: 7.8 g/dL — ABNORMAL LOW (ref 12.0–16.0)
Lymphocyte #: 2.5 10*3/uL (ref 1.0–3.6)
Lymphocyte %: 21.4 %
MCH: 24.7 pg — ABNORMAL LOW (ref 26.0–34.0)
MCHC: 28.1 g/dL — ABNORMAL LOW (ref 32.0–36.0)
MCV: 88 fL (ref 80–100)
Monocyte #: 1.3 x10 3/mm — ABNORMAL HIGH (ref 0.2–0.9)
Monocyte %: 11 %
NEUTROS ABS: 7 10*3/uL — AB (ref 1.4–6.5)
Neutrophil %: 61.1 %
Platelet: 143 10*3/uL — ABNORMAL LOW (ref 150–440)
RBC: 3.14 10*6/uL — ABNORMAL LOW (ref 3.80–5.20)
RDW: 18.4 % — ABNORMAL HIGH (ref 11.5–14.5)
WBC: 11.5 10*3/uL — ABNORMAL HIGH (ref 3.6–11.0)

## 2014-01-17 LAB — BASIC METABOLIC PANEL
Anion Gap: 5 — ABNORMAL LOW (ref 7–16)
Anion Gap: 5 — ABNORMAL LOW (ref 7–16)
BUN: 71 mg/dL — ABNORMAL HIGH (ref 7–18)
BUN: 71 mg/dL — AB (ref 7–18)
CALCIUM: 8.3 mg/dL — AB (ref 8.5–10.1)
CHLORIDE: 108 mmol/L — AB (ref 98–107)
CO2: 24 mmol/L (ref 21–32)
CREATININE: 4.87 mg/dL — AB (ref 0.60–1.30)
Calcium, Total: 8.1 mg/dL — ABNORMAL LOW (ref 8.5–10.1)
Chloride: 113 mmol/L — ABNORMAL HIGH (ref 98–107)
Co2: 27 mmol/L (ref 21–32)
Creatinine: 5.06 mg/dL — ABNORMAL HIGH (ref 0.60–1.30)
EGFR (African American): 11 — ABNORMAL LOW
GFR CALC AF AMER: 12 — AB
GFR CALC NON AF AMER: 10 — AB
GFR CALC NON AF AMER: 9 — AB
GLUCOSE: 131 mg/dL — AB (ref 65–99)
Glucose: 103 mg/dL — ABNORMAL HIGH (ref 65–99)
Osmolality: 304 (ref 275–301)
Osmolality: 302 (ref 275–301)
POTASSIUM: 5.8 mmol/L — AB (ref 3.5–5.1)
Potassium: 6.6 mmol/L (ref 3.5–5.1)
Sodium: 142 mmol/L (ref 136–145)
Sodium: 140 mmol/L (ref 136–145)

## 2014-01-17 LAB — CK: CK, Total: 31 U/L (ref 26–192)

## 2014-01-18 LAB — BASIC METABOLIC PANEL
ANION GAP: 8 (ref 7–16)
Anion Gap: 7 (ref 7–16)
Anion Gap: 5 — ABNORMAL LOW (ref 7–16)
BUN: 68 mg/dL — AB (ref 7–18)
BUN: 61 mg/dL — AB (ref 7–18)
BUN: 56 mg/dL — AB (ref 7–18)
CO2: 26 mmol/L (ref 21–32)
CREATININE: 5.03 mg/dL — AB (ref 0.60–1.30)
CREATININE: 4.65 mg/dL — AB (ref 0.60–1.30)
CREATININE: 3.91 mg/dL — AB (ref 0.60–1.30)
Calcium, Total: 8.1 mg/dL — ABNORMAL LOW (ref 8.5–10.1)
Calcium, Total: 8.1 mg/dL — ABNORMAL LOW (ref 8.5–10.1)
Calcium, Total: 8.5 mg/dL (ref 8.5–10.1)
Chloride: 108 mmol/L — ABNORMAL HIGH (ref 98–107)
Chloride: 106 mmol/L (ref 98–107)
Chloride: 107 mmol/L (ref 98–107)
Co2: 28 mmol/L (ref 21–32)
Co2: 25 mmol/L (ref 21–32)
EGFR (African American): 13 — ABNORMAL LOW
EGFR (Non-African Amer.): 10 — ABNORMAL LOW
EGFR (Non-African Amer.): 13 — ABNORMAL LOW
GFR CALC AF AMER: 11 — AB
GFR CALC AF AMER: 15 — AB
GFR CALC NON AF AMER: 9 — AB
GLUCOSE: 136 mg/dL — AB (ref 65–99)
Glucose: 119 mg/dL — ABNORMAL HIGH (ref 65–99)
Glucose: 149 mg/dL — ABNORMAL HIGH (ref 65–99)
OSMOLALITY: 302 (ref 275–301)
OSMOLALITY: 298 (ref 275–301)
Osmolality: 297 (ref 275–301)
Potassium: 4.9 mmol/L (ref 3.5–5.1)
Potassium: 5 mmol/L (ref 3.5–5.1)
Potassium: 5.2 mmol/L — ABNORMAL HIGH (ref 3.5–5.1)
SODIUM: 140 mmol/L (ref 136–145)
Sodium: 141 mmol/L (ref 136–145)
Sodium: 139 mmol/L (ref 136–145)

## 2014-01-18 LAB — CBC WITH DIFFERENTIAL/PLATELET
Basophil #: 0 10*3/uL (ref 0.0–0.1)
Basophil %: 0.6 %
EOS PCT: 5.9 %
Eosinophil #: 0.4 10*3/uL (ref 0.0–0.7)
HCT: 22.3 % — ABNORMAL LOW (ref 35.0–47.0)
HGB: 6.7 g/dL — ABNORMAL LOW (ref 12.0–16.0)
LYMPHS PCT: 21 %
Lymphocyte #: 1.3 10*3/uL (ref 1.0–3.6)
MCH: 24.9 pg — ABNORMAL LOW (ref 26.0–34.0)
MCHC: 30.1 g/dL — ABNORMAL LOW (ref 32.0–36.0)
MCV: 83 fL (ref 80–100)
MONOS PCT: 10.7 %
Monocyte #: 0.7 x10 3/mm (ref 0.2–0.9)
NEUTROS PCT: 61.8 %
Neutrophil #: 3.9 10*3/uL (ref 1.4–6.5)
Platelet: 91 10*3/uL — ABNORMAL LOW (ref 150–440)
RBC: 2.7 10*6/uL — ABNORMAL LOW (ref 3.80–5.20)
RDW: 17.4 % — AB (ref 11.5–14.5)
WBC: 6.3 10*3/uL (ref 3.6–11.0)

## 2014-01-18 LAB — PHOSPHORUS: Phosphorus: 5.9 mg/dL — ABNORMAL HIGH (ref 2.5–4.9)

## 2014-01-18 LAB — MAGNESIUM: Magnesium: 1.5 mg/dL — ABNORMAL LOW

## 2014-01-18 LAB — URINE CULTURE

## 2014-01-19 LAB — CBC WITH DIFFERENTIAL/PLATELET
BASOS PCT: 0.8 %
Basophil #: 0.1 10*3/uL (ref 0.0–0.1)
EOS ABS: 0.5 10*3/uL (ref 0.0–0.7)
EOS PCT: 4.8 %
HCT: 22.1 % — ABNORMAL LOW (ref 35.0–47.0)
HGB: 6.9 g/dL — AB (ref 12.0–16.0)
Lymphocyte #: 4.7 10*3/uL — ABNORMAL HIGH (ref 1.0–3.6)
Lymphocyte %: 42 %
MCH: 25.4 pg — ABNORMAL LOW (ref 26.0–34.0)
MCHC: 31.3 g/dL — AB (ref 32.0–36.0)
MCV: 81 fL (ref 80–100)
MONO ABS: 1.3 x10 3/mm — AB (ref 0.2–0.9)
Monocyte %: 11.4 %
NEUTROS PCT: 41 %
Neutrophil #: 4.6 10*3/uL (ref 1.4–6.5)
Platelet: 106 10*3/uL — ABNORMAL LOW (ref 150–440)
RBC: 2.72 10*6/uL — AB (ref 3.80–5.20)
RDW: 17.4 % — AB (ref 11.5–14.5)
WBC: 11.2 10*3/uL — ABNORMAL HIGH (ref 3.6–11.0)

## 2014-01-19 LAB — BASIC METABOLIC PANEL
Anion Gap: 4 — ABNORMAL LOW (ref 7–16)
BUN: 48 mg/dL — ABNORMAL HIGH (ref 7–18)
CALCIUM: 8 mg/dL — AB (ref 8.5–10.1)
CO2: 27 mmol/L (ref 21–32)
Chloride: 109 mmol/L — ABNORMAL HIGH (ref 98–107)
Creatinine: 3.21 mg/dL — ABNORMAL HIGH (ref 0.60–1.30)
EGFR (Non-African Amer.): 16 — ABNORMAL LOW
GFR CALC AF AMER: 19 — AB
Glucose: 133 mg/dL — ABNORMAL HIGH (ref 65–99)
OSMOLALITY: 294 (ref 275–301)
POTASSIUM: 4.5 mmol/L (ref 3.5–5.1)
SODIUM: 140 mmol/L (ref 136–145)

## 2014-01-19 LAB — PHOSPHORUS: Phosphorus: 2.4 mg/dL — ABNORMAL LOW (ref 2.5–4.9)

## 2014-01-19 LAB — MAGNESIUM
MAGNESIUM: 1.8 mg/dL
Magnesium: 1.2 mg/dL — ABNORMAL LOW

## 2014-01-20 DIAGNOSIS — R4182 Altered mental status, unspecified: Secondary | ICD-10-CM

## 2014-01-20 DIAGNOSIS — J9600 Acute respiratory failure, unspecified whether with hypoxia or hypercapnia: Secondary | ICD-10-CM

## 2014-01-20 LAB — CBC WITH DIFFERENTIAL/PLATELET
Basophil #: 0.1 10*3/uL (ref 0.0–0.1)
Basophil %: 0.7 %
Eosinophil #: 0.5 10*3/uL (ref 0.0–0.7)
Eosinophil %: 6 %
HCT: 24.1 % — ABNORMAL LOW (ref 35.0–47.0)
HGB: 7.6 g/dL — ABNORMAL LOW (ref 12.0–16.0)
Lymphocyte #: 2.4 10*3/uL (ref 1.0–3.6)
Lymphocyte %: 31.2 %
MCH: 25.7 pg — ABNORMAL LOW (ref 26.0–34.0)
MCHC: 31.7 g/dL — ABNORMAL LOW (ref 32.0–36.0)
MCV: 81 fL (ref 80–100)
Monocyte #: 0.7 x10 3/mm (ref 0.2–0.9)
Monocyte %: 9.1 %
Neutrophil #: 4 10*3/uL (ref 1.4–6.5)
Neutrophil %: 53 %
Platelet: 101 10*3/uL — ABNORMAL LOW (ref 150–440)
RBC: 2.97 10*6/uL — ABNORMAL LOW (ref 3.80–5.20)
RDW: 17.7 % — ABNORMAL HIGH (ref 11.5–14.5)
WBC: 7.6 10*3/uL (ref 3.6–11.0)

## 2014-01-20 LAB — BASIC METABOLIC PANEL
Anion Gap: 4 — ABNORMAL LOW (ref 7–16)
BUN: 30 mg/dL — AB (ref 7–18)
CREATININE: 1.85 mg/dL — AB (ref 0.60–1.30)
Calcium, Total: 7.7 mg/dL — ABNORMAL LOW (ref 8.5–10.1)
Chloride: 105 mmol/L (ref 98–107)
Co2: 32 mmol/L (ref 21–32)
EGFR (African American): 36 — ABNORMAL LOW
EGFR (Non-African Amer.): 30 — ABNORMAL LOW
Glucose: 159 mg/dL — ABNORMAL HIGH (ref 65–99)
OSMOLALITY: 291 (ref 275–301)
Potassium: 4.2 mmol/L (ref 3.5–5.1)
Sodium: 141 mmol/L (ref 136–145)

## 2014-01-20 LAB — PHOSPHORUS: Phosphorus: 2.6 mg/dL (ref 2.5–4.9)

## 2014-01-20 LAB — MAGNESIUM: MAGNESIUM: 1.4 mg/dL — AB

## 2014-01-21 DIAGNOSIS — J9600 Acute respiratory failure, unspecified whether with hypoxia or hypercapnia: Secondary | ICD-10-CM | POA: Diagnosis not present

## 2014-01-21 DIAGNOSIS — R4182 Altered mental status, unspecified: Secondary | ICD-10-CM | POA: Diagnosis not present

## 2014-01-21 LAB — BASIC METABOLIC PANEL
Anion Gap: 5 — ABNORMAL LOW (ref 7–16)
BUN: 17 mg/dL (ref 7–18)
CHLORIDE: 108 mmol/L — AB (ref 98–107)
CO2: 30 mmol/L (ref 21–32)
Calcium, Total: 8 mg/dL — ABNORMAL LOW (ref 8.5–10.1)
Creatinine: 1.46 mg/dL — ABNORMAL HIGH (ref 0.60–1.30)
EGFR (African American): 48 — ABNORMAL LOW
GFR CALC NON AF AMER: 39 — AB
GLUCOSE: 178 mg/dL — AB (ref 65–99)
OSMOLALITY: 291 (ref 275–301)
Potassium: 4.1 mmol/L (ref 3.5–5.1)
Sodium: 143 mmol/L (ref 136–145)

## 2014-01-21 LAB — MAGNESIUM
MAGNESIUM: 1.5 mg/dL — AB
Magnesium: 1.3 mg/dL — ABNORMAL LOW

## 2014-01-21 LAB — CBC WITH DIFFERENTIAL/PLATELET
Basophil #: 0 10*3/uL (ref 0.0–0.1)
Basophil %: 0.5 %
EOS PCT: 6.7 %
Eosinophil #: 0.4 10*3/uL (ref 0.0–0.7)
HCT: 24.4 % — AB (ref 35.0–47.0)
HGB: 7.7 g/dL — AB (ref 12.0–16.0)
Lymphocyte #: 1.8 10*3/uL (ref 1.0–3.6)
Lymphocyte %: 28.6 %
MCH: 26.1 pg (ref 26.0–34.0)
MCHC: 31.6 g/dL — AB (ref 32.0–36.0)
MCV: 83 fL (ref 80–100)
MONO ABS: 0.7 x10 3/mm (ref 0.2–0.9)
MONOS PCT: 11.4 %
NEUTROS PCT: 52.8 %
Neutrophil #: 3.3 10*3/uL (ref 1.4–6.5)
Platelet: 95 10*3/uL — ABNORMAL LOW (ref 150–440)
RBC: 2.96 10*6/uL — ABNORMAL LOW (ref 3.80–5.20)
RDW: 17.9 % — ABNORMAL HIGH (ref 11.5–14.5)
WBC: 6.3 10*3/uL (ref 3.6–11.0)

## 2014-01-21 LAB — CULTURE, BLOOD (SINGLE)

## 2014-01-21 LAB — PHOSPHORUS: PHOSPHORUS: 2.6 mg/dL (ref 2.5–4.9)

## 2014-01-21 LAB — URINE CULTURE

## 2014-01-22 DIAGNOSIS — R4182 Altered mental status, unspecified: Secondary | ICD-10-CM | POA: Diagnosis not present

## 2014-01-22 DIAGNOSIS — J9600 Acute respiratory failure, unspecified whether with hypoxia or hypercapnia: Secondary | ICD-10-CM | POA: Diagnosis not present

## 2014-01-22 LAB — BASIC METABOLIC PANEL
Anion Gap: 7 (ref 7–16)
BUN: 11 mg/dL (ref 7–18)
CALCIUM: 7.6 mg/dL — AB (ref 8.5–10.1)
CO2: 29 mmol/L (ref 21–32)
Chloride: 109 mmol/L — ABNORMAL HIGH (ref 98–107)
Creatinine: 1.19 mg/dL (ref 0.60–1.30)
EGFR (African American): >60
GFR CALC NON AF AMER: 50 — AB
Glucose: 163 mg/dL — ABNORMAL HIGH (ref 65–99)
Osmolality: 292 (ref 275–301)
POTASSIUM: 4.2 mmol/L (ref 3.5–5.1)
Sodium: 145 mmol/L (ref 136–145)

## 2014-01-22 LAB — CBC WITH DIFFERENTIAL/PLATELET
BASOS ABS: 0 10*3/uL (ref 0.0–0.1)
BASOS PCT: 0.6 %
EOS PCT: 7.6 %
Eosinophil #: 0.5 10*3/uL (ref 0.0–0.7)
HCT: 24 % — AB (ref 35.0–47.0)
HGB: 7.4 g/dL — AB (ref 12.0–16.0)
LYMPHS PCT: 25.4 %
Lymphocyte #: 1.5 10*3/uL (ref 1.0–3.6)
MCH: 25.9 pg — AB (ref 26.0–34.0)
MCHC: 30.8 g/dL — AB (ref 32.0–36.0)
MCV: 84 fL (ref 80–100)
MONO ABS: 0.8 x10 3/mm (ref 0.2–0.9)
MONOS PCT: 13.4 %
Neutrophil #: 3.2 10*3/uL (ref 1.4–6.5)
Neutrophil %: 53 %
PLATELETS: 93 10*3/uL — AB (ref 150–440)
RBC: 2.85 10*6/uL — ABNORMAL LOW (ref 3.80–5.20)
RDW: 18.2 % — ABNORMAL HIGH (ref 11.5–14.5)
WBC: 6 10*3/uL (ref 3.6–11.0)

## 2014-01-22 LAB — MAGNESIUM: Magnesium: 1.6 mg/dL — ABNORMAL LOW

## 2014-01-22 LAB — CLOSTRIDIUM DIFFICILE(ARMC)

## 2014-01-22 LAB — PHOSPHORUS: Phosphorus: 2.2 mg/dL — ABNORMAL LOW (ref 2.5–4.9)

## 2014-01-23 DIAGNOSIS — J9600 Acute respiratory failure, unspecified whether with hypoxia or hypercapnia: Secondary | ICD-10-CM | POA: Diagnosis not present

## 2014-01-23 DIAGNOSIS — R4182 Altered mental status, unspecified: Secondary | ICD-10-CM | POA: Diagnosis not present

## 2014-01-23 LAB — CBC WITH DIFFERENTIAL/PLATELET
Basophil #: 0 10*3/uL (ref 0.0–0.1)
Basophil %: 0.5 %
EOS PCT: 6.1 %
Eosinophil #: 0.4 10*3/uL (ref 0.0–0.7)
HCT: 22.4 % — ABNORMAL LOW (ref 35.0–47.0)
HGB: 6.7 g/dL — ABNORMAL LOW (ref 12.0–16.0)
Lymphocyte #: 1.4 10*3/uL (ref 1.0–3.6)
Lymphocyte %: 24.1 %
MCH: 25.7 pg — ABNORMAL LOW (ref 26.0–34.0)
MCHC: 30.1 g/dL — ABNORMAL LOW (ref 32.0–36.0)
MCV: 85 fL (ref 80–100)
MONOS PCT: 13.6 %
Monocyte #: 0.8 x10 3/mm (ref 0.2–0.9)
NEUTROS ABS: 3.3 10*3/uL (ref 1.4–6.5)
NEUTROS PCT: 55.7 %
PLATELETS: 85 10*3/uL — AB (ref 150–440)
RBC: 2.62 10*6/uL — ABNORMAL LOW (ref 3.80–5.20)
RDW: 19.1 % — ABNORMAL HIGH (ref 11.5–14.5)
WBC: 6 10*3/uL (ref 3.6–11.0)

## 2014-01-23 LAB — BASIC METABOLIC PANEL
Anion Gap: 4 — ABNORMAL LOW (ref 7–16)
BUN: 16 mg/dL (ref 7–18)
CALCIUM: 7.9 mg/dL — AB (ref 8.5–10.1)
CHLORIDE: 107 mmol/L (ref 98–107)
CO2: 30 mmol/L (ref 21–32)
Creatinine: 1.1 mg/dL (ref 0.60–1.30)
EGFR (African American): >60
GFR CALC NON AF AMER: 55 — AB
Glucose: 191 mg/dL — ABNORMAL HIGH (ref 65–99)
Osmolality: 288 (ref 275–301)
Potassium: 4.5 mmol/L (ref 3.5–5.1)
Sodium: 141 mmol/L (ref 136–145)

## 2014-01-23 LAB — PHOSPHORUS
PHOSPHORUS: 2.7 mg/dL (ref 2.5–4.9)
Phosphorus: 2.2 mg/dL — ABNORMAL LOW (ref 2.5–4.9)

## 2014-01-23 LAB — MAGNESIUM
Magnesium: 1.6 mg/dL — ABNORMAL LOW
Magnesium: 1.7 mg/dL — ABNORMAL LOW

## 2014-01-24 LAB — BASIC METABOLIC PANEL
Anion Gap: 5 — ABNORMAL LOW (ref 7–16)
BUN: 22 mg/dL — ABNORMAL HIGH (ref 7–18)
CALCIUM: 8.1 mg/dL — AB (ref 8.5–10.1)
CHLORIDE: 107 mmol/L (ref 98–107)
Co2: 29 mmol/L (ref 21–32)
Creatinine: 1.14 mg/dL (ref 0.60–1.30)
EGFR (Non-African Amer.): 52 — ABNORMAL LOW
GLUCOSE: 210 mg/dL — AB (ref 65–99)
OSMOLALITY: 291 (ref 275–301)
POTASSIUM: 4.7 mmol/L (ref 3.5–5.1)
Sodium: 141 mmol/L (ref 136–145)

## 2014-01-24 LAB — CBC WITH DIFFERENTIAL/PLATELET
Basophil #: 0 10*3/uL (ref 0.0–0.1)
Basophil %: 0.5 %
Eosinophil #: 0.5 10*3/uL (ref 0.0–0.7)
Eosinophil %: 5.5 %
HCT: 24.8 % — ABNORMAL LOW (ref 35.0–47.0)
HGB: 7.8 g/dL — ABNORMAL LOW (ref 12.0–16.0)
Lymphocyte #: 1.9 10*3/uL (ref 1.0–3.6)
Lymphocyte %: 22.2 %
MCH: 26.6 pg (ref 26.0–34.0)
MCHC: 31.5 g/dL — ABNORMAL LOW (ref 32.0–36.0)
MCV: 85 fL (ref 80–100)
Monocyte #: 0.9 x10 3/mm (ref 0.2–0.9)
Monocyte %: 11.1 %
Neutrophil #: 5.1 10*3/uL (ref 1.4–6.5)
Neutrophil %: 60.7 %
Platelet: 102 10*3/uL — ABNORMAL LOW (ref 150–440)
RBC: 2.94 10*6/uL — ABNORMAL LOW (ref 3.80–5.20)
RDW: 18.5 % — ABNORMAL HIGH (ref 11.5–14.5)
WBC: 8.5 10*3/uL (ref 3.6–11.0)

## 2014-01-24 LAB — EXPECTORATED SPUTUM ASSESSMENT W REFEX TO RESP CULTURE

## 2014-01-24 LAB — CULTURE, BLOOD (SINGLE)

## 2014-01-24 LAB — MAGNESIUM: Magnesium: 2 mg/dL

## 2014-01-24 LAB — PHOSPHORUS: Phosphorus: 2.4 mg/dL — ABNORMAL LOW (ref 2.5–4.9)

## 2014-01-25 ENCOUNTER — Inpatient Hospital Stay
Admission: AD | Admit: 2014-01-25 | Discharge: 2014-03-15 | Disposition: A | Discharge status: Skilled Nursing Facility | Payer: Self-pay | Source: Ambulatory Visit | Attending: Internal Medicine | Admitting: Internal Medicine

## 2014-01-25 ENCOUNTER — Other Ambulatory Visit (HOSPITAL_COMMUNITY): Payer: Self-pay

## 2014-01-25 DIAGNOSIS — J9601 Acute respiratory failure with hypoxia: Secondary | ICD-10-CM

## 2014-01-25 DIAGNOSIS — J9600 Acute respiratory failure, unspecified whether with hypoxia or hypercapnia: Secondary | ICD-10-CM

## 2014-01-25 DIAGNOSIS — Z431 Encounter for attention to gastrostomy: Secondary | ICD-10-CM

## 2014-01-25 DIAGNOSIS — Z452 Encounter for adjustment and management of vascular access device: Secondary | ICD-10-CM

## 2014-01-25 DIAGNOSIS — R1310 Dysphagia, unspecified: Secondary | ICD-10-CM | POA: Insufficient documentation

## 2014-01-25 DIAGNOSIS — D242 Benign neoplasm of left breast: Secondary | ICD-10-CM

## 2014-01-25 DIAGNOSIS — I509 Heart failure, unspecified: Secondary | ICD-10-CM | POA: Insufficient documentation

## 2014-01-25 DIAGNOSIS — G9389 Other specified disorders of brain: Secondary | ICD-10-CM

## 2014-01-25 DIAGNOSIS — J9690 Respiratory failure, unspecified, unspecified whether with hypoxia or hypercapnia: Secondary | ICD-10-CM

## 2014-01-25 DIAGNOSIS — Z0189 Encounter for other specified special examinations: Secondary | ICD-10-CM

## 2014-01-25 DIAGNOSIS — R918 Other nonspecific abnormal finding of lung field: Secondary | ICD-10-CM

## 2014-01-25 DIAGNOSIS — Z4659 Encounter for fitting and adjustment of other gastrointestinal appliance and device: Secondary | ICD-10-CM

## 2014-01-25 HISTORY — DX: Type 2 diabetes mellitus without complications: E11.9

## 2014-01-25 HISTORY — DX: Morbid (severe) obesity due to excess calories: E66.01

## 2014-01-25 HISTORY — DX: Obstructive sleep apnea (adult) (pediatric): G47.33

## 2014-01-25 HISTORY — DX: Presence of prosthetic heart valve: Z95.2

## 2014-01-25 HISTORY — DX: Acute on chronic diastolic (congestive) heart failure: I50.33

## 2014-01-25 HISTORY — DX: Chronic kidney disease, unspecified: N17.9

## 2014-01-25 HISTORY — DX: Chronic kidney disease, unspecified: N18.9

## 2014-01-25 HISTORY — DX: Hypothyroidism, unspecified: E03.9

## 2014-01-25 HISTORY — DX: Chronic obstructive pulmonary disease, unspecified: J44.9

## 2014-01-25 HISTORY — DX: Anemia, unspecified: D64.9

## 2014-01-25 LAB — BLOOD GAS, ARTERIAL
Acid-Base Excess: 5.5 mmol/L — ABNORMAL HIGH (ref 0.0–2.0)
BICARBONATE: 29.3 meq/L — AB (ref 20.0–24.0)
FIO2: 0.4 %
MECHVT: 550 mL
O2 SAT: 97.4 %
PEEP: 7 cmH2O
Patient temperature: 100.8
RATE: 24 resp/min
TCO2: 30.6 mmol/L (ref 0–100)
pCO2 arterial: 44.5 mmHg (ref 35.0–45.0)
pH, Arterial: 7.44 (ref 7.350–7.450)
pO2, Arterial: 91.7 mmHg (ref 80.0–100.0)

## 2014-01-25 LAB — BASIC METABOLIC PANEL
ANION GAP: 6 — AB (ref 7–16)
BUN: 24 mg/dL — AB (ref 7–18)
CALCIUM: 8.4 mg/dL — AB (ref 8.5–10.1)
CO2: 30 mmol/L (ref 21–32)
Chloride: 106 mmol/L (ref 98–107)
Creatinine: 1.05 mg/dL (ref 0.60–1.30)
EGFR (African American): >60
GFR CALC NON AF AMER: 58 — AB
Glucose: 174 mg/dL — ABNORMAL HIGH (ref 65–99)
Osmolality: 291 (ref 275–301)
Potassium: 4.3 mmol/L (ref 3.5–5.1)
Sodium: 142 mmol/L (ref 136–145)

## 2014-01-25 LAB — CBC WITH DIFFERENTIAL/PLATELET
BASOS ABS: 0 10*3/uL (ref 0.0–0.1)
Basophil %: 0.6 %
Eosinophil #: 0.3 10*3/uL (ref 0.0–0.7)
Eosinophil %: 4.2 %
HCT: 22.5 % — ABNORMAL LOW (ref 35.0–47.0)
HGB: 7.1 g/dL — AB (ref 12.0–16.0)
Lymphocyte #: 1.4 10*3/uL (ref 1.0–3.6)
Lymphocyte %: 17.9 %
MCH: 26.8 pg (ref 26.0–34.0)
MCHC: 31.3 g/dL — AB (ref 32.0–36.0)
MCV: 86 fL (ref 80–100)
Monocyte #: 0.8 x10 3/mm (ref 0.2–0.9)
Monocyte %: 9.8 %
NEUTROS ABS: 5.4 10*3/uL (ref 1.4–6.5)
Neutrophil %: 67.5 %
Platelet: 118 10*3/uL — ABNORMAL LOW (ref 150–440)
RBC: 2.64 10*6/uL — AB (ref 3.80–5.20)
RDW: 18.6 % — AB (ref 11.5–14.5)
WBC: 8 10*3/uL (ref 3.6–11.0)

## 2014-01-26 ENCOUNTER — Encounter: Payer: Self-pay | Admitting: Adult Health

## 2014-01-26 DIAGNOSIS — J9690 Respiratory failure, unspecified, unspecified whether with hypoxia or hypercapnia: Secondary | ICD-10-CM | POA: Insufficient documentation

## 2014-01-26 DIAGNOSIS — Z0189 Encounter for other specified special examinations: Secondary | ICD-10-CM | POA: Insufficient documentation

## 2014-01-26 DIAGNOSIS — J9601 Acute respiratory failure with hypoxia: Secondary | ICD-10-CM

## 2014-01-26 DIAGNOSIS — J9600 Acute respiratory failure, unspecified whether with hypoxia or hypercapnia: Secondary | ICD-10-CM

## 2014-01-26 LAB — PREPARE RBC (CROSSMATCH)

## 2014-01-26 LAB — COMPREHENSIVE METABOLIC PANEL
ALT: 30 U/L (ref 0–35)
ANION GAP: 6 (ref 5–15)
AST: 51 U/L — ABNORMAL HIGH (ref 0–37)
Albumin: 1.8 g/dL — ABNORMAL LOW (ref 3.5–5.2)
Alkaline Phosphatase: 71 U/L (ref 39–117)
BUN: 20 mg/dL (ref 6–23)
CALCIUM: 8.4 mg/dL (ref 8.4–10.5)
CO2: 30 mmol/L (ref 19–32)
Chloride: 102 mEq/L (ref 96–112)
Creatinine, Ser: 0.93 mg/dL (ref 0.50–1.10)
GFR calc non Af Amer: 67 mL/min — ABNORMAL LOW (ref >90)
GFR, EST AFRICAN AMERICAN: 78 mL/min — AB (ref >90)
Glucose, Bld: 138 mg/dL — ABNORMAL HIGH (ref 70–99)
Potassium: 3.7 mmol/L (ref 3.5–5.1)
SODIUM: 138 mmol/L (ref 135–145)
TOTAL PROTEIN: 5.5 g/dL — AB (ref 6.0–8.3)
Total Bilirubin: 2.4 mg/dL — ABNORMAL HIGH (ref 0.3–1.2)

## 2014-01-26 LAB — CBC
HEMATOCRIT: 22.7 % — AB (ref 36.0–46.0)
HEMOGLOBIN: 6.7 g/dL — AB (ref 12.0–15.0)
MCH: 25.5 pg — AB (ref 26.0–34.0)
MCHC: 29.5 g/dL — ABNORMAL LOW (ref 30.0–36.0)
MCV: 86.3 fL (ref 78.0–100.0)
Platelets: 158 10*3/uL (ref 150–400)
RBC: 2.63 MIL/uL — ABNORMAL LOW (ref 3.87–5.11)
RDW: 18.4 % — ABNORMAL HIGH (ref 11.5–15.5)
WBC: 6.7 10*3/uL (ref 4.0–10.5)

## 2014-01-26 LAB — CULTURE, BLOOD (SINGLE)

## 2014-01-26 NOTE — Consult Note (Signed)
Name: Theresa Leon MRN: [id number] DOB: 10/27/1955    ADMISSION DATE:  01/25/2014 CONSULTATION DATE:  12/30  REFERRING MD :  Laren Everts   CHIEF COMPLAINT:  Respiratory failure   BRIEF PATIENT DESCRIPTION: 58 yo morbidly obese female with hx COPD, aortic valve replacement, DM with recent admission for acute respiratory failure r/t sepsis in setting labial abscess, c/b acute renal failure.  She required CVVH during that admission for volume removal.  Returned to outside hospital with acute hypoxic/hypercarbic respiratory failure and was intubated 12/23.  Tx 12/29 [facility] for further vent weaning.  PCCM consulted for vent mgmt.   SIGNIFICANT EVENTS    STUDIES:     HISTORY OF PRESENT ILLNESS:  58 yo morbidly obese female with hx COPD, aortic valve replacement, DM with recent admission for acute respiratory failure r/t sepsis in setting labial abscess, c/b acute renal failure.  She required CVVH during that admission for volume removal.  Returned to outside hospital with acute hypoxic/hypercarbic respiratory failure and was intubated 12/23.  Tx 12/29 [facility] for further vent weaning.  PCCM consulted for vent mgmt.   PAST MEDICAL HISTORY :   has a past medical history of Asthma (1975); Diabetes mellitus without complication (2993); Heart disease; Neoplasm of unspecified nature of breast (2013); COPD (chronic obstructive pulmonary disease); OSA (obstructive sleep apnea); Diastolic CHF, acute on chronic; Acute on chronic renal failure; Anemia; Diabetes; H/O aortic valve replacement; Morbid obesity; and Hypothyroid.  has past surgical history that includes Back surgery; Phrenic nerve pacemaker implantation (2009); aortic valve relaced  (2008); Cholecystectomy (2007); and Breast surgery (September 25, 2011). Prior to Admission medications   Medication Sig Start Date End Date Taking? Authorizing Provider  albuterol (PROVENTIL HFA;VENTOLIN HFA) 108 (90 BASE) MCG/ACT inhaler Inhale 2 puffs  into the lungs every 6 (six) hours as needed for wheezing.    Historical Provider, MD  Calcium Carbonate-Vitamin D (CALCIUM + D PO) Take 1 tablet by mouth daily.    Historical Provider, MD  citalopram (CELEXA) 20 MG tablet Take 1 tablet by mouth 2 (two) times daily. 07/10/12   Historical Provider, MD  diclofenac (VOLTAREN) 75 MG EC tablet Take 75 mg by mouth 2 (two) times daily.    Historical Provider, MD  EPINEPHrine (EPIPEN) 0.3 mg/0.3 mL DEVI Inject into the muscle once.    Historical Provider, MD  fluticasone (FLONASE) 50 MCG/ACT nasal spray Place 2 sprays into the nose daily as needed. 06/06/12   Historical Provider, MD  gabapentin (NEURONTIN) 600 MG tablet Take 600 mg by mouth 3 (three) times daily.    Historical Provider, MD  hydrochlorothiazide (HYDRODIURIL) 25 MG tablet Take 1 tablet by mouth daily. 07/10/12   Historical Provider, MD  HYDROcodone-acetaminophen (NORCO/VICODIN) 5-325 MG per tablet Take 1 tablet by mouth 2 (two) times daily. 07/03/12   Historical Provider, MD  levothyroxine (SYNTHROID, LEVOTHROID) 100 MCG tablet Take 100 mcg by mouth daily.    Historical Provider, MD  loratadine (CLARITIN) 10 MG tablet Take 10 mg by mouth daily.    Historical Provider, MD  meclizine (ANTIVERT) 25 MG tablet Take 1 tablet by mouth every 6 (six) hours. 06/12/12   Historical Provider, MD  montelukast (SINGULAIR) 10 MG tablet Take 10 mg by mouth at bedtime.    Historical Provider, MD  omeprazole (PRILOSEC) 40 MG capsule Take 40 mg by mouth daily.    Historical Provider, MD  ranitidine (ZANTAC) 150 MG capsule Take 150 mg by mouth 2 (two) times daily.  Historical Provider, MD  SOTALOL AF 80 MG TABS Take 1 tablet by mouth 2 (two) times daily. 07/10/12   Historical Provider, MD  traZODone (DESYREL) 50 MG tablet Take 1 tablet by mouth at bedtime as needed. 07/10/12   Historical Provider, MD   Allergies  Allergen Reactions  . Mold Extract [Trichophyton]   . Nitroglycerin Other (See Comments)    Low Blood  Pressure    FAMILY HISTORY:  family history includes Breast cancer in her maternal grandmother; Lung cancer in her maternal grandmother and paternal grandmother; Skin telangiectasia in her mother. SOCIAL HISTORY:  reports that she has quit smoking. She does not have any smokeless tobacco history on file. She reports that she does not drink alcohol or use illicit drugs.  REVIEW OF SYSTEMS:   Unable, pt sedated on vent   SUBJECTIVE:   VITAL SIGNS:  Reviewed at bedside.  Abnormal values discussed in imp/plan   PHYSICAL EXAMINATION: General:  Morbidly obese female, NAD on vent  Neuro:  Sedated on vent, RASS 0, opens eyes to name, nods, follows commands intermittently HEENT:  Mm moist, ETT, large neck  Cardiovascular:  s1s2 distant  Lungs:  resps even non labored on vent, diminished throughout, few scattered rhonchi, rare exp wheeze  Abdomen:  Obese, round, soft, +bs  Musculoskeletal:  Warm and dry, 1-2+ BLE edema    Recent Labs Lab 01/26/14 0630  NA 138  K 3.7  CL 102  CO2 30  BUN 20  CREATININE 0.93  GLUCOSE 138*    Recent Labs Lab 01/26/14 0630  HGB 6.7*  HCT 22.7*  WBC 6.7  PLT 158   Dg Chest Port 1 View  01/25/2014   CLINICAL DATA:  Evaluate endotracheal tube  EXAM: PORTABLE CHEST - 1 VIEW  COMPARISON:  01/24/2014  FINDINGS: Endotracheal tube terminates 3.5 cm above the carina.  Multifocal patchy opacities, right lower lobe predominant, suspicious for pneumonia. Superimposed mild interstitial edema is possible. No pleural effusion or pneumothorax.  Cardiomegaly.  IMPRESSION: Endotracheal tube terminates 3.5 cm above the carina.  Multifocal patchy opacities, right lower lobe predominant, suspicious for pneumonia.  Superimposed mild interstitial edema is possible.   Electronically Signed   By: Julian Hy M.D.   On: 01/25/2014 20:55   Dg Abd Portable 1v  01/25/2014   CLINICAL DATA:  NG tube placement  EXAM: PORTABLE ABDOMEN - 1 VIEW  COMPARISON:  01/17/2014   FINDINGS: Enteric tube is looped in the gastric body.  IMPRESSION: Enteric tube is looped in the gastric body.   Electronically Signed   By: Julian Hy M.D.   On: 01/25/2014 20:58    ASSESSMENT / PLAN:  Acute vent dependent respiratory failure - multifactorial in setting volume overload r/t decompensated heart failure c/b acute renal failure, COPD, ?HCAP and decompensated OSA/OHS.  ETT 12/23>>>  PLAN -  Decrease RR, peep - done  F/u CXR  abx per primary (vanc, cefepime started 12/30)  Decrease sedation as able  Diuresis as BP and SCr tol  Consider HD for volume removal if fail lasix -- cont attempts at lasix for now with Scr ok (0.93)  BD's  Hopeful for trial at extubation if we can get volume off    Nickolas Madrid, NP 01/26/2014  11:54 AM Pager: [phone] or [phone]   STAFF NOTE: I, Merrie Roof, MD FACP have personally reviewed patient's available data, including medical history, events of note, physical examination and test results as part of my evaluation. I have discussed  with resident/NP and other care providers such as pharmacist, RN and RRT. In addition, I personally evaluated patient and elicited key findings of: morbid obesity, resolving renal failure, pos balance prior, abg reviewed, coarse BS, reduce MV, reduce peep, wean cpap 5 ps 5, goal 2 hr, reduce sedation rass to 0, upright, would favor extubation trial not straight to trach, would start lasix drip , goal neg balance 2 liters, avoid transfusion , hope lasix will concetrate, if abx started use PCT algo to limit exposure. The patient is critically ill with multiple organ systems failure and requires high complexity decision making for assessment and support, frequent evaluation and titration of therapies, application of advanced monitoring technologies and extensive interpretation of multiple databases.   Critical Care Time devoted to patient care services described in this note is30  Minutes. This  time reflects time of care of this signee: Merrie Roof, MD FACP. This critical care time does not reflect procedure time, or teaching time or supervisory time of PA/NP/Med student/Med Resident etc but could involve care discussion time. Rest per NP/medical resident whose note is outlined above and that I agree with   Lavon Paganini. Titus Mould, MD, [place] Pgr: [facility] 01/26/2014 12:18 PM

## 2014-01-27 ENCOUNTER — Other Ambulatory Visit (HOSPITAL_COMMUNITY): Payer: Self-pay

## 2014-01-27 LAB — BLOOD GAS, ARTERIAL
Acid-Base Excess: 7.7 mmol/L — ABNORMAL HIGH (ref 0.0–2.0)
BICARBONATE: 32 meq/L — AB (ref 20.0–24.0)
FIO2: 0.4 %
MECHVT: 550 mL
O2 Saturation: 99.4 %
PEEP: 5 cmH2O
PH ART: 7.438 (ref 7.350–7.450)
PO2 ART: 158 mmHg — AB (ref 80.0–100.0)
Patient temperature: 98.6
RATE: 18 resp/min
TCO2: 33.5 mmol/L (ref 0–100)
pCO2 arterial: 48.1 mmHg — ABNORMAL HIGH (ref 35.0–45.0)

## 2014-01-27 LAB — BASIC METABOLIC PANEL
ANION GAP: 13 (ref 5–15)
BUN: 15 mg/dL (ref 6–23)
CHLORIDE: 100 meq/L (ref 96–112)
CO2: 26 mmol/L (ref 19–32)
Calcium: 8.6 mg/dL (ref 8.4–10.5)
Creatinine, Ser: 0.82 mg/dL (ref 0.50–1.10)
GFR calc non Af Amer: 78 mL/min — ABNORMAL LOW (ref >90)
Glucose, Bld: 117 mg/dL — ABNORMAL HIGH (ref 70–99)
POTASSIUM: 4.3 mmol/L (ref 3.5–5.1)
Sodium: 139 mmol/L (ref 135–145)

## 2014-01-27 LAB — MAGNESIUM
MAGNESIUM: 1.4 mg/dL — AB (ref 1.5–2.5)
MAGNESIUM: 1.6 mg/dL (ref 1.5–2.5)

## 2014-01-27 LAB — CLOSTRIDIUM DIFFICILE BY PCR: CDIFFPCR: POSITIVE — AB

## 2014-01-27 NOTE — Consult Note (Signed)
Name: JAYLENNE HAMELIN MRN: [id number] DOB: 1955-06-15    ADMISSION DATE:  01/25/2014 CONSULTATION DATE:  12/30  REFERRING MD :  Laren Everts   CHIEF COMPLAINT:  Respiratory failure   BRIEF PATIENT DESCRIPTION: 58 yo morbidly obese female with hx COPD, aortic valve replacement, DM with recent admission for acute respiratory failure r/t sepsis in setting labial abscess, c/b acute renal failure.  She required CVVH during that admission for volume removal.  Returned to outside hospital with acute hypoxic/hypercarbic respiratory failure and was intubated 12/23.  Tx 12/29 [facility] for further vent weaning.  PCCM consulted for vent mgmt.   SIGNIFICANT EVENTS  12/29- lasix drip 12/31 cdiff pos  STUDIES:   SUBJECTIVE: PS attempts, cdiff pos  VITAL SIGNS:  Reviewed at bedside.  Abnormal values discussed in imp/plan   PHYSICAL EXAMINATION: General:  Morbidly obese female, No distress Neuro:  Sedated on vent, RASS-2 HEENT:  Mm moist, ETT, large neck  Cardiovascular:  s1s2 distant  Lungs:  Reduced, little less coarse Abdomen:  Obese, round, soft, +bs  Musculoskeletal:  Warm and dry, 1-2+ BLE edema    Recent Labs Lab 01/26/14 0630 01/27/14 0742  NA 138 139  K 3.7 4.3  CL 102 100  CO2 30 26  BUN 20 15  CREATININE 0.93 0.82  GLUCOSE 138* 117*    Recent Labs Lab 01/26/14 0630  HGB 6.7*  HCT 22.7*  WBC 6.7  PLT 158   Dg Chest Port 1 View  01/27/2014   CLINICAL DATA:  Respiratory failure.  EXAM: PORTABLE CHEST - 1 VIEW  COMPARISON:  01/25/2014  FINDINGS: Endotracheal tube remains with the tip approximately 4 cm above the carina. The right jugular central line appears to have retracted with the tip now at the base of the neck in the jugular vein. Lungs show persistent edema and infiltrate versus asymmetric edema in the right lung. No significant pleural effusions. No pneumothorax identified.  IMPRESSION: Persistent edema with some asymmetry with increased prominence of edema/  airspace disease in the right lung. Central line tip appears retracted and now lies at the base of the neck in the jugular vein.   Electronically Signed   By: Aletta Edouard M.D.   On: 01/27/2014 08:16   Dg Chest Port 1 View  01/25/2014   CLINICAL DATA:  Evaluate endotracheal tube  EXAM: PORTABLE CHEST - 1 VIEW  COMPARISON:  01/24/2014  FINDINGS: Endotracheal tube terminates 3.5 cm above the carina.  Multifocal patchy opacities, right lower lobe predominant, suspicious for pneumonia. Superimposed mild interstitial edema is possible. No pleural effusion or pneumothorax.  Cardiomegaly.  IMPRESSION: Endotracheal tube terminates 3.5 cm above the carina.  Multifocal patchy opacities, right lower lobe predominant, suspicious for pneumonia.  Superimposed mild interstitial edema is possible.   Electronically Signed   By: Julian Hy M.D.   On: 01/25/2014 20:55   Dg Abd Portable 1v  01/25/2014   CLINICAL DATA:  NG tube placement  EXAM: PORTABLE ABDOMEN - 1 VIEW  COMPARISON:  01/17/2014  FINDINGS: Enteric tube is looped in the gastric body.  IMPRESSION: Enteric tube is looped in the gastric body.   Electronically Signed   By: Julian Hy M.D.   On: 01/25/2014 20:58    ASSESSMENT / PLAN:  Acute vent dependent respiratory failure - multifactorial in setting volume overload r/t decompensated heart failure c/b acute renal failure, COPD, ?HCAP and decompensated OSA/OHS.   ETT 12/23>>>  PLAN -  ABg reviewed, reduce rate slight Wean  attempts ps 12, cpap 5, did not tolerated\ lower PS Increase lasix infusion to further neg balance, she has improved with diuresis thus far If MAp drops in this circumstance would continue lasix drip and support BP May need trach next week Not impressed hcap May need xaraxolyn addition Responding reasonable well thus far to diuresis, no indication crrt at this stage pcxr follow up supp mag  Will follow up [employer]. Titus Mould, MD, [place] Pgr:  [facility]

## 2014-01-28 ENCOUNTER — Encounter: Payer: Self-pay | Admitting: Internal Medicine

## 2014-01-28 ENCOUNTER — Other Ambulatory Visit (HOSPITAL_COMMUNITY): Payer: Self-pay

## 2014-01-28 LAB — BASIC METABOLIC PANEL
ANION GAP: 8 (ref 5–15)
BUN: 13 mg/dL (ref 6–23)
CO2: 38 mmol/L — ABNORMAL HIGH (ref 19–32)
Calcium: 8.7 mg/dL (ref 8.4–10.5)
Chloride: 95 mEq/L — ABNORMAL LOW (ref 96–112)
Creatinine, Ser: 0.78 mg/dL (ref 0.50–1.10)
GFR calc Af Amer: >90 mL/min (ref >90)
GLUCOSE: 118 mg/dL — AB (ref 70–99)
Potassium: 3.2 mmol/L — ABNORMAL LOW (ref 3.5–5.1)
Sodium: 141 mmol/L (ref 135–145)

## 2014-01-28 LAB — MAGNESIUM: MAGNESIUM: 1.5 mg/dL (ref 1.5–2.5)

## 2014-01-28 LAB — BRAIN NATRIURETIC PEPTIDE: B Natriuretic Peptide: 325.5 pg/mL — ABNORMAL HIGH (ref 0.0–100.0)

## 2014-01-29 LAB — BRAIN NATRIURETIC PEPTIDE: B Natriuretic Peptide: 194.3 pg/mL — ABNORMAL HIGH (ref 0.0–100.0)

## 2014-01-29 LAB — BASIC METABOLIC PANEL
Anion gap: 16 — ABNORMAL HIGH (ref 5–15)
BUN: 16 mg/dL (ref 6–23)
CO2: 33 mmol/L — AB (ref 19–32)
Calcium: 8.7 mg/dL (ref 8.4–10.5)
Chloride: 92 mEq/L — ABNORMAL LOW (ref 96–112)
Creatinine, Ser: 0.69 mg/dL (ref 0.50–1.10)
GFR calc Af Amer: >90 mL/min (ref >90)
Glucose, Bld: 112 mg/dL — ABNORMAL HIGH (ref 70–99)
Potassium: 3.4 mmol/L — ABNORMAL LOW (ref 3.5–5.1)
Sodium: 141 mmol/L (ref 135–145)

## 2014-01-29 LAB — URINE CULTURE

## 2014-01-29 LAB — TRIGLYCERIDES: Triglycerides: 153 mg/dL — ABNORMAL HIGH (ref <150)

## 2014-01-29 LAB — MAGNESIUM: Magnesium: 1.5 mg/dL (ref 1.5–2.5)

## 2014-01-30 LAB — CULTURE, RESPIRATORY: SPECIAL REQUESTS: NORMAL

## 2014-01-30 LAB — CBC
HEMATOCRIT: 28.8 % — AB (ref 36.0–46.0)
Hemoglobin: 8.4 g/dL — ABNORMAL LOW (ref 12.0–15.0)
MCH: 25.6 pg — ABNORMAL LOW (ref 26.0–34.0)
MCHC: 29.2 g/dL — ABNORMAL LOW (ref 30.0–36.0)
MCV: 87.8 fL (ref 78.0–100.0)
Platelets: 361 10*3/uL (ref 150–400)
RBC: 3.28 MIL/uL — ABNORMAL LOW (ref 3.87–5.11)
RDW: 18.1 % — AB (ref 11.5–15.5)
WBC: 7 10*3/uL (ref 4.0–10.5)

## 2014-01-30 LAB — TYPE AND SCREEN
ABO/RH(D): A POS
Antibody Screen: POSITIVE
DAT, IgG: POSITIVE
DONOR AG TYPE: NEGATIVE
DONOR AG TYPE: NEGATIVE
Unit division: 0
Unit division: 0

## 2014-01-30 LAB — CULTURE, BLOOD (SINGLE)

## 2014-01-30 LAB — TRIGLYCERIDES: Triglycerides: 204 mg/dL — ABNORMAL HIGH (ref <150)

## 2014-01-30 LAB — BASIC METABOLIC PANEL
Anion gap: 4 — ABNORMAL LOW (ref 5–15)
BUN: 18 mg/dL (ref 6–23)
CALCIUM: 8.7 mg/dL (ref 8.4–10.5)
CHLORIDE: 90 meq/L — AB (ref 96–112)
CO2: 45 mmol/L (ref 19–32)
Creatinine, Ser: 0.8 mg/dL (ref 0.50–1.10)
GFR calc non Af Amer: 80 mL/min — ABNORMAL LOW (ref >90)
GLUCOSE: 104 mg/dL — AB (ref 70–99)
Potassium: 3.4 mmol/L — ABNORMAL LOW (ref 3.5–5.1)
Sodium: 139 mmol/L (ref 135–145)

## 2014-01-30 LAB — MAGNESIUM: Magnesium: 1.8 mg/dL (ref 1.5–2.5)

## 2014-01-30 LAB — CULTURE, RESPIRATORY W GRAM STAIN

## 2014-01-31 ENCOUNTER — Other Ambulatory Visit (HOSPITAL_COMMUNITY): Payer: Self-pay

## 2014-01-31 LAB — BASIC METABOLIC PANEL
ANION GAP: 9 (ref 5–15)
BUN: 18 mg/dL (ref 6–23)
CO2: 43 mmol/L — AB (ref 19–32)
Calcium: 8.9 mg/dL (ref 8.4–10.5)
Chloride: 90 mEq/L — ABNORMAL LOW (ref 96–112)
Creatinine, Ser: 0.83 mg/dL (ref 0.50–1.10)
GFR calc Af Amer: 89 mL/min — ABNORMAL LOW (ref >90)
GFR, EST NON AFRICAN AMERICAN: 77 mL/min — AB (ref >90)
Glucose, Bld: 112 mg/dL — ABNORMAL HIGH (ref 70–99)
Potassium: 2.8 mmol/L — ABNORMAL LOW (ref 3.5–5.1)
SODIUM: 142 mmol/L (ref 135–145)

## 2014-01-31 LAB — MAGNESIUM: MAGNESIUM: 1.7 mg/dL (ref 1.5–2.5)

## 2014-01-31 LAB — BRAIN NATRIURETIC PEPTIDE: B Natriuretic Peptide: 168.4 pg/mL — ABNORMAL HIGH (ref 0.0–100.0)

## 2014-01-31 LAB — VANCOMYCIN, TROUGH: Vancomycin Tr: 18.5 ug/mL (ref 10.0–20.0)

## 2014-01-31 LAB — TRIGLYCERIDES: Triglycerides: 200 mg/dL — ABNORMAL HIGH (ref <150)

## 2014-01-31 NOTE — Progress Notes (Signed)
   Name: Theresa Leon MRN: [id number] DOB: 07-25-1955    ADMISSION DATE:  01/25/2014 CONSULTATION DATE:  12/30  REFERRING MD :  Laren Everts   CHIEF COMPLAINT:  Respiratory failure   BRIEF PATIENT DESCRIPTION: 59 yo morbidly obese female with hx COPD, aortic valve replacement, DM with recent admission for acute respiratory failure r/t sepsis in setting labial abscess, c/b acute renal failure.  She required CVVH during that admission for volume removal.  Returned to outside hospital with acute hypoxic/hypercarbic respiratory failure and was intubated 12/23.  Tx 12/29 to [facility] for further vent weaning.  PCCM consulted for vent mgmt.   SIGNIFICANT EVENTS  12/29- lasix drip 12/31 cdiff pos  STUDIES:   SUBJECTIVE: PS attempts continue   VITAL SIGNS:  Reviewed at bedside.  Abnormal values discussed in imp/plan   PHYSICAL EXAMINATION: General:  Morbidly obese female, No distress, follows commands despite diprivan drip Neuro:  Sedated on vent, RASS o, follows commands HEENT:  Mm moist, ETT, large neck  Cardiovascular:  s1s2 distant  Lungs:  Diminished in bases Abdomen:  Obese, round, soft, +bs  Musculoskeletal:  Warm and dry, 1-2+ BLE edema , decreased   Recent Labs Lab 01/28/14 0736 01/29/14 0610 01/30/14 0400  NA 141 141 139  K 3.2* 3.4* 3.4*  CL 95* 92* 90*  CO2 38* 33* 45*  BUN 13 16 18   CREATININE 0.78 0.69 0.80  GLUCOSE 118* 112* 104*    Recent Labs Lab 01/26/14 0630 01/30/14 0400  HGB 6.7* 8.4*  HCT 22.7* 28.8*  WBC 6.7 7.0  PLT 158 361   Dg Chest Port 1 View  01/31/2014   CLINICAL DATA:  Evaluate CHF  EXAM: PORTABLE CHEST - 1 VIEW  COMPARISON:  01/28/2014  FINDINGS: Support devices are Stable. There is cardiomegaly. Prior CABG. Bilateral airspace opacities are again noted, right greater than left, worsening since prior study. This could represent asymmetric edema or infection. Suspect layering effusions.  IMPRESSION: Worsening aeration and bilateral airspace  disease, right greater than left. This could represent asymmetric edema or infection.  Suspect small layering bilateral effusions.   Electronically Signed   By: Rolm Baptise M.D.   On: 01/31/2014 08:03    ASSESSMENT / PLAN:  Acute vent dependent respiratory failure - multifactorial in setting volume overload r/t decompensated heart failure c/b acute renal failure, COPD, ?HCAP and decompensated OSA/OHS.   ETT 12/23>>>  PLAN -  Wean attempts ps 12, cpap 5, she is now negative >3 litres from lasix drip and may be extubatable 01/31/14 although c x r looks worse 1/4. May need trach sooner as she may have needed trach due to body habitus.  Continue lasix infusion to further neg balance, she has improved with diuresis thus far If MAp drops in this circumstance would continue lasix drip and support BP Replete K+ per ssh, mag repleted May need trach if fails current tx , intubated since 12/23 Responding reasonable well thus far to diuresis, no indication crrt at this stage pcxr follow up   [place] Pager [phone] till 3 pm If no answer page [phone] 01/31/2014, 9:12 AM

## 2014-02-01 ENCOUNTER — Other Ambulatory Visit (HOSPITAL_COMMUNITY): Payer: Self-pay

## 2014-02-01 DIAGNOSIS — I509 Heart failure, unspecified: Secondary | ICD-10-CM | POA: Insufficient documentation

## 2014-02-01 DIAGNOSIS — I5031 Acute diastolic (congestive) heart failure: Secondary | ICD-10-CM

## 2014-02-01 LAB — BASIC METABOLIC PANEL
Anion gap: 8 (ref 5–15)
BUN: 15 mg/dL (ref 6–23)
CALCIUM: 9.4 mg/dL (ref 8.4–10.5)
CO2: 44 mmol/L (ref 19–32)
Chloride: 91 mEq/L — ABNORMAL LOW (ref 96–112)
Creatinine, Ser: 0.82 mg/dL (ref 0.50–1.10)
GFR calc Af Amer: >90 mL/min (ref >90)
GFR, EST NON AFRICAN AMERICAN: 78 mL/min — AB (ref >90)
GLUCOSE: 113 mg/dL — AB (ref 70–99)
Potassium: 3 mmol/L — ABNORMAL LOW (ref 3.5–5.1)
SODIUM: 143 mmol/L (ref 135–145)

## 2014-02-01 LAB — MAGNESIUM: MAGNESIUM: 2 mg/dL (ref 1.5–2.5)

## 2014-02-01 NOTE — Progress Notes (Signed)
Name: Theresa Leon MRN: [id number] DOB: August 30, 1955    ADMISSION DATE:  01/25/2014 CONSULTATION DATE:  12/30  REFERRING MD :  Laren Everts   CHIEF COMPLAINT:  Respiratory failure   BRIEF PATIENT DESCRIPTION: 59 yo morbidly obese female with hx COPD, aortic valve replacement, DM with recent admission for acute respiratory failure r/t sepsis in setting labial abscess, c/b acute renal failure.  She required CVVH during that admission for volume removal.  Returned to outside hospital with acute hypoxic/hypercarbic respiratory failure and was intubated 12/23.  Tx 12/29 to [facility] for further vent weaning.  PCCM consulted for vent mgmt.   SIGNIFICANT EVENTS  12/29- lasix drip 12/31 cdiff pos 01/31/14: still on lasix gtt. Doing PSV    SUBJECTIVE/OVERNIGHT/INTERVAL HX 02/01/14: Primary service change to lasix intermitttent and changed to diprivan to fent/versed prn yesterday due to agitation. Did PSV all day yesterday. Today: RT reports increased tan continuous copiouys secretions via ET tube . No fever per RN. Going to start SBT. A bit confused today  VITAL SIGNS:  T 98.64m P 75, RR 20, BP 114/100. Still in negative balance I/)  1630/3500  PHYSICAL EXAMINATION: General:  Morbidly obese female, NEuro: RASS - 2 HEENT:  Mm moist, ETT, large neck  Cardiovascular:  s1s2 distant  Lungs:  Diminished in bases Abdomen:  Obese, round, soft, +bs  Musculoskeletal:  Warm and dry, 1-2+ BLE edema , decreased   PULMONARY  Recent Labs Lab 01/25/14 2230 01/27/14 0627  PHART 7.440 7.438  PCO2ART 44.5 48.1*  PO2ART 91.7 158.0*  HCO3 29.3* 32.0*  TCO2 30.6 33.5  O2SAT 97.4 99.4    CBC  Recent Labs Lab 01/26/14 0630 01/30/14 0400  HGB 6.7* 8.4*  HCT 22.7* 28.8*  WBC 6.7 7.0  PLT 158 361    COAGULATION No results for input(s): INR in the last 168 hours.  CARDIAC  No results for input(s): TROPONINI in the last 168 hours. No results for input(s): PROBNP in the last 168  hours.   CHEMISTRY  Recent Labs Lab 01/28/14 0736 01/29/14 0610 01/30/14 0400 01/31/14 0800 02/01/14 0630  NA 141 141 139 142 143  K 3.2* 3.4* 3.4* 2.8* 3.0*  CL 95* 92* 90* 90* 91*  CO2 38* 33* 45* 43* 44*  GLUCOSE 118* 112* 104* 112* 113*  BUN 13 16 18 18 15   CREATININE 0.78 0.69 0.80 0.83 0.82  CALCIUM 8.7 8.7 8.7 8.9 9.4  MG 1.5 1.5 1.8 1.7 2.0   CrCl cannot be calculated (Unknown ideal weight.).   LIVER  Recent Labs Lab 01/26/14 0630  AST 51*  ALT 30  ALKPHOS 71  BILITOT 2.4*  PROT 5.5*  ALBUMIN 1.8*     INFECTIOUS No results for input(s): LATICACIDVEN, PROCALCITON in the last 168 hours.   ENDOCRINE CBG (last 3)  No results for input(s): GLUCAP in the last 72 hours.  BNP 168     IMAGING x48h Dg Chest Port 1 View  02/01/2014   CLINICAL DATA:  Acute respiratory failure .  EXAM: PORTABLE CHEST - 1 VIEW  COMPARISON:  01/31/2014.  FINDINGS: Endotracheal tube in stable position. Right IJ line noted with tip projected over the right internal jugular vein at the level of the thoracic inlet. NG tube noted. Lower hemidiaphragms not imaged. Cardiac pacer noted with lead tips in the right atrium and projected over the right ventricle. Prior CABG. Cardiomegaly with pulmonary venous congestion and interstitial prominence again noted. Right pleural effusions present. These findings are consistent with  persistent congestive heart failure clear underlying pneumonia cannot be excluded. No pneumothorax.  IMPRESSION: 1. Lines and tubes unchanged as described above. 2. Persistent changes of congestive heart failure with pulmonary interstitial edema and small right pleural effusion. Cardiac pacer in stable position. Prior CABG   Electronically Signed   By: Marcello Moores  Register   On: 02/01/2014 07:58   Dg Chest Port 1 View  01/31/2014   CLINICAL DATA:  Evaluate CHF  EXAM: PORTABLE CHEST - 1 VIEW  COMPARISON:  01/28/2014  FINDINGS: Support devices are Stable. There is cardiomegaly.  Prior CABG. Bilateral airspace opacities are again noted, right greater than left, worsening since prior study. This could represent asymmetric edema or infection. Suspect layering effusions.  IMPRESSION: Worsening aeration and bilateral airspace disease, right greater than left. This could represent asymmetric edema or infection.  Suspect small layering bilateral effusions.   Electronically Signed   By: Rolm Baptise M.D.   On: 01/31/2014 08:03       ASSESSMENT / PLAN:  Acute vent dependent respiratory failure - multifactorial in setting volume overload r/t decompensated heart failure c/b acute renal failure, COPD, ?HCAP and decompensated OSA/OHS.    ETT 12/23>>>   ROunds 02/01/14: multiple factors precluding extubation including baseline OSA/OHV, reported non-compliance, increased copious respiratory secretions today, still wet CXR and agitation. This is despite wean 5/5 yesterday  [place] - informed Dr Titus Mould Increase lasix to 71m TID from 420mbid Repeat tracjheal aspirate -culture Ok to do SBT today but no extubation Recommend primary serfvice - dc or reduc [facility]     The patient is critically ill with multiple organ systems failure and requires high complexity decision making for assessment and support, frequent evaluation and titration of therapies, application of advanced monitoring technologies and extensive interpretation of multiple databases.   Critical Care Time devoted to patient care services described in this note is  30  Minutes. This time reflects time of care of this signee Dr MuBrand MalesThis critical care time does not reflect procedure time, or teaching time or supervisory time of PA/NP/Med student/Med Resident etc but could involve care discussion time    Dr. MuBrand MalesM.D., F.[facility].P Pulmonary and Critical Care Medicine Staff Physician CoCrescent Beachulmonary and  Critical Care Pager: [phone] no answer or between  15:00h - 7:00h: call [phone]  02/01/2014 9:33 AM

## 2014-02-02 ENCOUNTER — Ambulatory Visit: Payer: Self-pay | Admitting: Otolaryngology

## 2014-02-02 LAB — CULTURE, BLOOD (ROUTINE X 2)
CULTURE: NO GROWTH
CULTURE: NO GROWTH

## 2014-02-02 LAB — POTASSIUM: Potassium: 3.1 mmol/L — ABNORMAL LOW (ref 3.5–5.1)

## 2014-02-02 LAB — MAGNESIUM: Magnesium: 2 mg/dL (ref 1.5–2.5)

## 2014-02-03 LAB — MAGNESIUM: Magnesium: 1.9 mg/dL (ref 1.5–2.5)

## 2014-02-03 LAB — CLOSTRIDIUM DIFFICILE BY PCR: Toxigenic C. Difficile by PCR: NEGATIVE

## 2014-02-03 LAB — POTASSIUM
POTASSIUM: 3.1 mmol/L — AB (ref 3.5–5.1)
POTASSIUM: 4.9 mmol/L (ref 3.5–5.1)

## 2014-02-03 MED ORDER — DEXTROSE 5 % IV SOLN
3.0000 g | INTRAVENOUS | Status: AC
  Administered 2014-02-04: 3 g via INTRAVENOUS

## 2014-02-03 NOTE — Progress Notes (Signed)
Name: Theresa Leon MRN: [id number] DOB: 30-Jul-1955    ADMISSION DATE:  01/25/2014 CONSULTATION DATE:  12/30  REFERRING MD :  Laren Everts   CHIEF COMPLAINT:  Respiratory failure   BRIEF PATIENT DESCRIPTION: 59 yo morbidly obese female with hx COPD, aortic valve replacement, DM with recent admission for acute respiratory failure r/t sepsis in setting labial abscess, c/b acute renal failure.  She required CVVH during that admission for volume removal.  Returned to outside hospital with acute hypoxic/hypercarbic respiratory failure and was intubated 12/23.  Tx 12/29 to [facility] for further vent weaning.  PCCM consulted for vent mgmt.   SIGNIFICANT EVENTS   12/31 cdiff pos 1/8 planned trach    SUBJECTIVE/OVERNIGHT/INTERVAL HX 1/7 failed weaning. Full vent support  VITAL SIGNS: Vital signs reviewed. Abnormal values will appear under impression plan section.    PHYSICAL EXAMINATION: General:  Morbidly obese female, Neuro: RASS 0, follows commands HEENT:  Mm moist, ETT, large neck  Cardiovascular:  s1s2 distant  Lungs:  Diminished in bases, unable to wean vent Abdomen:  Obese, round, soft, +bs  Musculoskeletal:  Warm and dry, 1-2+ BLE edema , decreased   PULMONARY No results for input(s): PHART, PCO2ART, PO2ART, HCO3, TCO2, O2SAT in the last 168 hours.  Invalid input(s): PCO2, PO2  CBC  Recent Labs Lab 01/30/14 0400  HGB 8.4*  HCT 28.8*  WBC 7.0  PLT 361    COAGULATION No results for input(s): INR in the last 168 hours.  CARDIAC  No results for input(s): TROPONINI in the last 168 hours. No results for input(s): PROBNP in the last 168 hours.   CHEMISTRY  Recent Labs Lab 01/28/14 0736 01/29/14 0610 01/30/14 0400 01/31/14 0800 02/01/14 0630 02/02/14 0600 02/03/14 0545  NA 141 141 139 142 143  --   --   K 3.2* 3.4* 3.4* 2.8* 3.0* 3.1* 3.1*  CL 95* 92* 90* 90* 91*  --   --   CO2 38* 33* 45* 43* 44*  --   --   GLUCOSE 118* 112* 104* 112* 113*  --   --     BUN 13 16 18 18 15   --   --   CREATININE 0.78 0.69 0.80 0.83 0.82  --   --   CALCIUM 8.7 8.7 8.7 8.9 9.4  --   --   MG 1.5 1.5 1.8 1.7 2.0 2.0 1.9   CrCl cannot be calculated (Unknown ideal weight.).   LIVER No results for input(s): AST, ALT, ALKPHOS, BILITOT, PROT, ALBUMIN, INR in the last 168 hours.   INFECTIOUS No results for input(s): LATICACIDVEN, PROCALCITON in the last 168 hours.   ENDOCRINE CBG (last 3)  No results for input(s): GLUCAP in the last 72 hours.  BNP 168     IMAGING x48h No results found.     ASSESSMENT / PLAN:  Acute vent dependent respiratory failure - multifactorial in setting volume overload r/t decompensated heart failure c/b acute renal failure, COPD, ?HCAP and decompensated OSA/OHS.    ETT 12/23>>>   ROunds 02/01/14: multiple factors precluding extubation including baseline OSA/OHV, reported non-compliance, increased copious respiratory secretions today, still wet CXR and agitation. This is despite wean 5/5 yesterday  [facility] - p;er ENT 1/8 Ok to do SBT today but no extubation Recommend primary service - dc or reduc gabapentin and trazadone from [place] [facility] Pager [phone] till 3 pm If no answer page [phone] 02/03/2014, 9:25 AM      ]

## 2014-02-03 NOTE — Consult Note (Unsigned)
NAME:  Theresa, Leon                      ACCOUNT NO.:  MEDICAL RECORD NO.:  [id number]  LOCATION:                                 FACILITY:  PHYSICIAN:  Leonides Sake. Lucia Gaskins, M.D. DATE OF BIRTH:  DATE OF CONSULTATION:  02/02/2014 DATE OF DISCHARGE:                                CONSULTATION   REASON FOR CONSULT:  Evaluate the patient for tracheotomy.  HISTORY OF PRESENTING ILLNESS:  Theresa Leon is a 59 year old very obese female with history of COPD, congestive heart failure, status post aortic valve replacement and pacemaker.  She has also had a recent attack of acute renal failure following sepsis and shock in September of 2015.  She initially presented to an outside hospital in acute respiratory failure, requiring intubation on January 19, 2014.  She was subsequently transferred to [facility] on January 26, 2014.  Acute pulmonary care was involved in her care since her hospitalization at [facility] with attempts to treat pneumonia as well as eliminate extra fluid from the renal failure in attempts to extubate the patient.  She has extreme obesity and obstructive sleep apnea.  She has been treated for pneumonia and is doing better since the treatment for pneumonia as well as fluid overload, but still does not meet the data for extubation.  It was recommended to consideration of tracheotomy.  PHYSICAL EXAMINATION:  The patient is intubated, but awake and alert, and discussed briefly with her concerning possible tracheotomy.  Exam of the neck, she is very obese with a very short neck, but there is no palpable masses around the trach and the trach is essentially midline.  IMPRESSION:  Acute-on-chronic respiratory failure with prolonged intubation over 2 weeks, history of chronic obstructive pulmonary disease, congestive heart failure and obstructive sleep apnea.  We will plan a tracheotomy and is scheduled for February 04, 2014, Friday morning at  7:30.          ______________________________ Leonides Sake. Lucia Gaskins, M.D.     CEN/MEDQ  D:  02/02/2014  T:  02/03/2014  Job:  [id number]

## 2014-02-04 ENCOUNTER — Other Ambulatory Visit (HOSPITAL_COMMUNITY): Payer: Self-pay

## 2014-02-04 ENCOUNTER — Encounter
Admission: AD | Disposition: A | Discharge status: Skilled Nursing Facility | Payer: Self-pay | Source: Ambulatory Visit | Attending: Internal Medicine

## 2014-02-04 ENCOUNTER — Encounter (HOSPITAL_COMMUNITY): Payer: Self-pay | Admitting: Certified Registered Nurse Anesthetist

## 2014-02-04 HISTORY — PX: TRACHEOSTOMY TUBE PLACEMENT: SHX814

## 2014-02-04 LAB — CBC
HCT: 36.6 % (ref 36.0–46.0)
Hemoglobin: 11 g/dL — ABNORMAL LOW (ref 12.0–15.0)
MCH: 26.9 pg (ref 26.0–34.0)
MCHC: 30.1 g/dL (ref 30.0–36.0)
MCV: 89.5 fL (ref 78.0–100.0)
PLATELETS: 429 10*3/uL — AB (ref 150–400)
RBC: 4.09 MIL/uL (ref 3.87–5.11)
RDW: 18.4 % — AB (ref 11.5–15.5)
WBC: 8.7 10*3/uL (ref 4.0–10.5)

## 2014-02-04 LAB — PROTIME-INR
INR: 1.19 (ref 0.00–1.49)
Prothrombin Time: 15.2 seconds (ref 11.6–15.2)

## 2014-02-04 LAB — MAGNESIUM: MAGNESIUM: 2 mg/dL (ref 1.5–2.5)

## 2014-02-04 LAB — BASIC METABOLIC PANEL
ANION GAP: 16 — AB (ref 5–15)
BUN: 24 mg/dL — ABNORMAL HIGH (ref 6–23)
CALCIUM: 9.8 mg/dL (ref 8.4–10.5)
CHLORIDE: 89 meq/L — AB (ref 96–112)
CO2: 32 mmol/L (ref 19–32)
CREATININE: 0.99 mg/dL (ref 0.50–1.10)
GFR calc Af Amer: 72 mL/min — ABNORMAL LOW (ref >90)
GFR, EST NON AFRICAN AMERICAN: 62 mL/min — AB (ref >90)
GLUCOSE: 131 mg/dL — AB (ref 70–99)
Potassium: 3.7 mmol/L (ref 3.5–5.1)
Sodium: 137 mmol/L (ref 135–145)

## 2014-02-04 LAB — CULTURE, RESPIRATORY

## 2014-02-04 LAB — CULTURE, RESPIRATORY W GRAM STAIN

## 2014-02-04 SURGERY — CREATION, TRACHEOSTOMY
Anesthesia: General | Site: Neck

## 2014-02-04 MED ORDER — SUCCINYLCHOLINE CHLORIDE 20 MG/ML IJ SOLN
INTRAMUSCULAR | Status: AC
  Filled 2014-02-04: qty 1

## 2014-02-04 MED ORDER — PROPOFOL 10 MG/ML IV BOLUS
INTRAVENOUS | Status: AC
  Filled 2014-02-04: qty 20

## 2014-02-04 MED ORDER — FENTANYL CITRATE 0.05 MG/ML IJ SOLN
INTRAMUSCULAR | Status: DC | PRN
  Administered 2014-02-04 (×5): 50 ug via INTRAVENOUS

## 2014-02-04 MED ORDER — MIDAZOLAM HCL 2 MG/2ML IJ SOLN
INTRAMUSCULAR | Status: AC
  Filled 2014-02-04: qty 2

## 2014-02-04 MED ORDER — EPHEDRINE SULFATE 50 MG/ML IJ SOLN
INTRAMUSCULAR | Status: AC
  Filled 2014-02-04: qty 1

## 2014-02-04 MED ORDER — SODIUM CHLORIDE 0.9 % IJ SOLN
INTRAMUSCULAR | Status: AC
  Filled 2014-02-04: qty 10

## 2014-02-04 MED ORDER — 0.9 % SODIUM CHLORIDE (POUR BTL) OPTIME
TOPICAL | Status: DC | PRN
  Administered 2014-02-04: 1000 mL

## 2014-02-04 MED ORDER — FENTANYL CITRATE 0.05 MG/ML IJ SOLN
INTRAMUSCULAR | Status: AC
  Filled 2014-02-04: qty 5

## 2014-02-04 MED ORDER — PHENYLEPHRINE HCL 10 MG/ML IJ SOLN
INTRAMUSCULAR | Status: DC | PRN
  Administered 2014-02-04 (×2): 80 ug via INTRAVENOUS

## 2014-02-04 MED ORDER — ROCURONIUM BROMIDE 50 MG/5ML IV SOLN
INTRAVENOUS | Status: AC
  Filled 2014-02-04: qty 1

## 2014-02-04 MED ORDER — MIDAZOLAM HCL 5 MG/5ML IJ SOLN
INTRAMUSCULAR | Status: DC | PRN
  Administered 2014-02-04 (×2): 2 mg via INTRAVENOUS

## 2014-02-04 MED ORDER — LIDOCAINE-EPINEPHRINE 1 %-1:100000 IJ SOLN
INTRAMUSCULAR | Status: DC | PRN
  Administered 2014-02-04: 4 mL

## 2014-02-04 MED ORDER — ROCURONIUM BROMIDE 100 MG/10ML IV SOLN
INTRAVENOUS | Status: DC | PRN
  Administered 2014-02-04: 50 mg via INTRAVENOUS

## 2014-02-04 MED ORDER — LACTATED RINGERS IV SOLN
INTRAVENOUS | Status: DC | PRN
  Administered 2014-02-04: 08:00:00 via INTRAVENOUS

## 2014-02-04 SURGICAL SUPPLY — 42 items
ATTRACTOMAT 16X20 MAGNETIC DRP (DRAPES) IMPLANT
BENZOIN TINCTURE PRP APPL 2/3 (GAUZE/BANDAGES/DRESSINGS) ×6 IMPLANT
BLADE 10 SAFETY STRL DISP (BLADE) ×3 IMPLANT
BLADE SURG 15 STRL LF DISP TIS (BLADE) ×1 IMPLANT
BLADE SURG 15 STRL SS (BLADE) ×2
CLEANER TIP ELECTROSURG 2X2 (MISCELLANEOUS) ×3 IMPLANT
COVER SURGICAL LIGHT HANDLE (MISCELLANEOUS) ×3 IMPLANT
ELECT COATED BLADE 2.86 ST (ELECTRODE) ×3 IMPLANT
ELECT REM PT RETURN 9FT ADLT (ELECTROSURGICAL) ×3
ELECTRODE REM PT RTRN 9FT ADLT (ELECTROSURGICAL) ×1 IMPLANT
GAUZE SPONGE 4X4 16PLY XRAY LF (GAUZE/BANDAGES/DRESSINGS) ×3 IMPLANT
GLOVE BIO SURGEON STRL SZ7.5 (GLOVE) ×3 IMPLANT
GLOVE BIOGEL PI IND STRL 8 (GLOVE) ×1 IMPLANT
GLOVE BIOGEL PI INDICATOR 8 (GLOVE) ×2
GLOVE SS BIOGEL STRL SZ 7.5 (GLOVE) ×3 IMPLANT
GLOVE SUPERSENSE BIOGEL SZ 7.5 (GLOVE) ×6
GLOVE SURG SS PI 8.0 STRL IVOR (GLOVE) ×3 IMPLANT
GOWN STRL REUS W/ TWL LRG LVL3 (GOWN DISPOSABLE) ×1 IMPLANT
GOWN STRL REUS W/ TWL XL LVL3 (GOWN DISPOSABLE) ×1 IMPLANT
GOWN STRL REUS W/TWL LRG LVL3 (GOWN DISPOSABLE) ×2
GOWN STRL REUS W/TWL XL LVL3 (GOWN DISPOSABLE) ×2
HOLDER TRACH TUBE VELCRO 19.5 (MISCELLANEOUS) ×6 IMPLANT
KIT BASIN OR (CUSTOM PROCEDURE TRAY) ×3 IMPLANT
KIT ROOM TURNOVER OR (KITS) ×3 IMPLANT
KIT SUCTION CATH 14FR (SUCTIONS) ×3 IMPLANT
NEEDLE HYPO 25X1 1.5 SAFETY (NEEDLE) ×3 IMPLANT
NS IRRIG 1000ML POUR BTL (IV SOLUTION) ×3 IMPLANT
PACK EENT II TURBAN DRAPE (CUSTOM PROCEDURE TRAY) ×3 IMPLANT
PAD ARMBOARD 7.5X6 YLW CONV (MISCELLANEOUS) ×6 IMPLANT
PENCIL BUTTON HOLSTER BLD 10FT (ELECTRODE) ×3 IMPLANT
SPONGE DRAIN TRACH 4X4 STRL 2S (GAUZE/BANDAGES/DRESSINGS) ×3 IMPLANT
SPONGE INTESTINAL PEANUT (DISPOSABLE) ×3 IMPLANT
SUT SILK 2 0 SH CR/8 (SUTURE) ×3 IMPLANT
SUT SILK 3 0 TIES 10X30 (SUTURE) IMPLANT
SYR 20CC LL (SYRINGE) ×3 IMPLANT
SYR CONTROL 10ML LL (SYRINGE) ×3 IMPLANT
TOWEL OR 17X26 10 PK STRL BLUE (TOWEL DISPOSABLE) ×3 IMPLANT
TUBE CONNECTING 12'X1/4 (SUCTIONS) ×1
TUBE CONNECTING 12X1/4 (SUCTIONS) ×2 IMPLANT
TUBE TRACH SHILEY  6 DIST  CUF (TUBING) IMPLANT
TUBE TRACH SHILEY 10 DIST CUFF (TUBING) IMPLANT
TUBE TRACH SHILEY 8 DIST CUF (TUBING) IMPLANT

## 2014-02-04 NOTE — Anesthesia Preprocedure Evaluation (Signed)
Anesthesia Evaluation  Patient identified by MRN, date of birth, ID band Patient awake    Reviewed: Allergy & Precautions, Patient's Chart, lab work & pertinent test results  History of Anesthesia Complications Negative for: history of anesthetic complications  Airway Mallampati: Intubated       Dental   Pulmonary asthma , sleep apnea , COPDformer smoker,    + decreased breath sounds      Cardiovascular +CHF Rhythm:Regular     Neuro/Psych negative neurological ROS  negative psych ROS   GI/Hepatic Neg liver ROS,   Endo/Other  diabetesHypothyroidism Morbid obesity  Renal/GU      Musculoskeletal   Abdominal   Peds  Hematology  (+) anemia ,   Anesthesia Other Findings   Reproductive/Obstetrics                             Anesthesia Physical Anesthesia Plan  ASA: III  Anesthesia Plan: General   Post-op Pain Management:    Induction: Inhalational  Airway Management Planned: Oral ETT  Additional Equipment: None  Intra-op Plan:   Post-operative Plan: Post-operative intubation/ventilation  Informed Consent: I have reviewed the patients History and Physical, chart, labs and discussed the procedure including the risks, benefits and alternatives for the proposed anesthesia with the patient or authorized representative who has indicated his/her understanding and acceptance.     Plan Discussed with: CRNA and Surgeon  Anesthesia Plan Comments:         Anesthesia Quick Evaluation

## 2014-02-04 NOTE — Brief Op Note (Signed)
01/25/2014 - 02/04/2014  8:46 AM  PATIENT:  Theresa Leon  59 y.o. female  PRE-OPERATIVE DIAGNOSIS:  RESPIRATORY FAILURE  POST-OPERATIVE DIAGNOSIS:  RESPIRATORY FAILURE  PROCEDURE:  Procedure(s): TRACHEOSTOMY (N/A) with Shiley 7 proximal XLT  SURGEON:  Surgeon(s) and Role:    * Rozetta Nunnery, MD - Primary  PHYSICIAN ASSISTANT:   ASSISTANTS: none   ANESTHESIA:   general  EBL:     BLOOD ADMINISTERED:none  DRAINS: none   LOCAL MEDICATIONS USED:  XYLOCAINE with EPI  4 cc  SPECIMEN:  No Specimen  DISPOSITION OF SPECIMEN:  N/A  COUNTS:  YES  TOURNIQUET:  * No tourniquets in log *  DICTATION: .Other Dictation: Dictation Number [id number]  PLAN OF CARE: Discharge to home after PACU  PATIENT DISPOSITION:  PACU - hemodynamically stable.   Delay start of Pharmacological VTE agent (>24hrs) due to surgical blood loss or risk of bleeding: not applicable

## 2014-02-04 NOTE — Interval H&P Note (Signed)
History and Physical Interval Note:  02/04/2014 7:42 AM  Theresa Leon  has presented today for surgery, with the diagnosis of RESPIRATORY FAILURE  The various methods of treatment have been discussed with the patient and family. After consideration of risks, benefits and other options for treatment, the patient has consented to  Procedure(s): TRACHEOSTOMY (N/A) as a surgical intervention .  The patient's history has been reviewed, patient examined, no change in status, stable for surgery.  I have reviewed the patient's chart and labs.  Questions were answered to the patient's satisfaction.     Miangel Flom

## 2014-02-04 NOTE — H&P (Signed)
PREOPERATIVE H&P  Chief Complaint: respiratory failure  HPI: Theresa Leon is a 59 y.o. female who presents for evaluation of respiratory failure. Patient was intubated in outside hospital on 01/19/14 and was subsequently [facility]. They have been unable to extubate her and expect a prolonged airway problem and recommended a tracheotomy. She's taken to the OR for tracheotomy.  Past Medical History  Diagnosis Date  . Asthma 1975  . Diabetes mellitus without complication 1610  . Heart disease   . 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 COPD (chronic obstructive pulmonary disease)   . OSA (obstructive sleep apnea)   . Diastolic CHF, acute on chronic   . Acute on chronic renal failure   . Anemia   . Diabetes   . H/O aortic valve replacement   . Morbid obesity   . Hypothyroid    Past Surgical History  Procedure Laterality Date  . Back surgery      AS CHILD  . Phrenic nerve pacemaker implantation  2009  . Aortic valve relaced   2008  . Cholecystectomy  2007  . Breast surgery  September 25, 2011    intraductal papilloma of the left breast   History   Social History  . Marital Status: Widowed    Spouse Name: N/A    Number of Children: N/A  . Years of Education: N/A   Social History Main Topics  . Smoking status: Former Research scientist (life sciences)  . Smokeless tobacco: Not on file  . Alcohol Use: No  . Drug Use: No  . Sexual Activity: Not on file   Other Topics Concern  . Not on file   Social History Narrative   Family History  Problem Relation Age of Onset  . Skin telangiectasia Mother   . Lung cancer Maternal Grandmother   . Lung cancer Paternal Grandmother   . Breast cancer Maternal Grandmother    Allergies  Allergen Reactions  . Mold Extract [Trichophyton]   . Nitroglycerin Other (See Comments)    Low Blood Pressure   Prior to Admission medications   Medication Sig Start Date End Date Taking? Authorizing  Provider  albuterol (PROVENTIL HFA;VENTOLIN HFA) 108 (90 BASE) MCG/ACT inhaler Inhale 2 puffs into the lungs every 6 (six) hours as needed for wheezing.    Historical Provider, MD  Calcium Carbonate-Vitamin D (CALCIUM + D PO) Take 1 tablet by mouth daily.    Historical Provider, MD  citalopram (CELEXA) 20 MG tablet Take 1 tablet by mouth 2 (two) times daily. 07/10/12   Historical Provider, MD  diclofenac (VOLTAREN) 75 MG EC tablet Take 75 mg by mouth 2 (two) times daily.    Historical Provider, MD  EPINEPHrine (EPIPEN) 0.3 mg/0.3 mL DEVI Inject into the muscle once.    Historical Provider, MD  fluticasone (FLONASE) 50 MCG/ACT nasal spray Place 2 sprays into the nose daily as needed. 06/06/12   Historical Provider, MD  gabapentin (NEURONTIN) 600 MG tablet Take 600 mg by mouth 3 (three) times daily.    Historical Provider, MD  hydrochlorothiazide (HYDRODIURIL) 25 MG tablet Take 1 tablet by mouth daily. 07/10/12   Historical Provider, MD  HYDROcodone-acetaminophen (NORCO/VICODIN) 5-325 MG per tablet Take 1 tablet by mouth 2 (two) times daily. 07/03/12   Historical Provider, MD  levothyroxine (SYNTHROID, LEVOTHROID) 100 MCG tablet Take 100 mcg by mouth daily.    Historical Provider, MD  loratadine (CLARITIN) 10 MG tablet Take 10  mg by mouth daily.    Historical Provider, MD  meclizine (ANTIVERT) 25 MG tablet Take 1 tablet by mouth every 6 (six) hours. 06/12/12   Historical Provider, MD  montelukast (SINGULAIR) 10 MG tablet Take 10 mg by mouth at bedtime.    Historical Provider, MD  omeprazole (PRILOSEC) 40 MG capsule Take 40 mg by mouth daily.    Historical Provider, MD  ranitidine (ZANTAC) 150 MG capsule Take 150 mg by mouth 2 (two) times daily.    Historical Provider, MD  SOTALOL AF 80 MG TABS Take 1 tablet by mouth 2 (two) times daily. 07/10/12   Historical Provider, MD  traZODone (DESYREL) 50 MG tablet Take 1 tablet by mouth at bedtime as needed. 07/10/12   Historical Provider, MD     Positive ROS:  na  All other systems have been reviewed and were otherwise negative with the exception of those mentioned in the HPI and as above.  Physical Exam: There were no vitals filed for this visit.  General: Alert, intubated Oral: Normal oral mucosa and tonsils Nasal: Clear nasal passages Neck: No palpable adenopathy or thyroid nodules. Trach midline Ear: Ear canal is clear with normal appearing TMs Cardiovascular: Regular rate and rhythm, no murmur.  Respiratory: Clear to auscultation Neurologic: Alert and oriented x 3   Assessment/Plan: RESPIRATORY FAILURE Plan for Procedure(s): TRACHEOSTOMY   Melony Overly, MD 02/04/2014 7:30 AM

## 2014-02-04 NOTE — Transfer of Care (Signed)
Immediate Anesthesia Transfer of Care Note  Patient: Theresa Leon  Procedure(s) Performed: Procedure(s): TRACHEOSTOMY (N/A)  Patient Location: Nursing Unit [facility]  Anesthesia Type:General  Level of Consciousness: awake and alert   Airway & Oxygen Therapy: Patient placed on Ventilator (see vital sign flow sheet for setting) and trach connected to vent.   Post-op Assessment: Report given to PACU RN, Post -op Vital signs reviewed and stable and Patient moving all extremities X 4  Post vital signs: Reviewed and stable  Complications: No apparent anesthesia complications

## 2014-02-04 NOTE — Interval H&P Note (Signed)
History and Physical Interval Note:  02/04/2014 7:38 AM  Theresa Leon  has presented today for surgery, with the diagnosis of RESPIRATORY FAILURE  The various methods of treatment have been discussed with the patient and family. After consideration of risks, benefits and other options for treatment, the patient has consented to  Procedure(s): TRACHEOSTOMY (N/A) as a surgical intervention .  The patient's history has been reviewed, patient examined, no change in status, stable for surgery.  I have reviewed the patient's chart and labs.  Questions were answered to the patient's satisfaction.     NEWMAN, CHRISTOPHER

## 2014-02-04 NOTE — Anesthesia Postprocedure Evaluation (Signed)
  Anesthesia Post-op Note  Patient: Theresa Leon  Procedure(s) Performed: Procedure(s): TRACHEOSTOMY (N/A)  [facility]  Anesthesia Type:General  Level of Consciousness: sedated  Airway and Oxygen Therapy: Patient remains intubated per anesthesia plan and new tracheostomy secure  Post-op Pain: none  Post-op Assessment: Post-op Vital signs reviewed, Patient's Cardiovascular Status Stable and Respiratory Function Stable  Post-op Vital Signs: Reviewed and stable  Last Vitals: There were no vitals filed for this visit.  Complications: No apparent anesthesia complications

## 2014-02-04 NOTE — H&P (Signed)
    Draft: Not Electronically Signed.  Rozetta Nunnery, MD Physician Unsigned Transcription  Consult Note 02/03/2014 1:29 AM    Expand All Collapse All   NAME: LEATTA, ALEWINE ACCOUNT NO.:  MEDICAL RECORD NO.: [id number]  LOCATION: FACILITY:  PHYSICIAN: Leonides Sake. Lucia Gaskins, M.D. DATE OF BIRTH:  DATE OF CONSULTATION: 02/02/2014 DATE OF DISCHARGE:   CONSULTATION   REASON FOR CONSULT: Evaluate the patient for tracheotomy.  HISTORY OF PRESENTING ILLNESS: Nelva Hauk is a 59 year old very obese female with history of COPD, congestive heart failure, status post aortic valve replacement and pacemaker. She has also had a recent attack of acute renal failure following sepsis and shock in September of 2015. She initially presented to an outside hospital in acute respiratory failure, requiring intubation on January 19, 2014. She was subsequently transferred to [facility] on January 26, 2014. Acute pulmonary care was involved in her care since her hospitalization at [facility] with attempts to treat pneumonia as well as eliminate extra fluid from the renal failure in attempts to extubate the patient. She has extreme obesity and obstructive sleep apnea. She has been treated for pneumonia and is doing better since the treatment for pneumonia as well as fluid overload, but still does not meet the data for extubation. It was recommended to consideration of tracheotomy.  PHYSICAL EXAMINATION: The patient is intubated, but awake and alert, and discussed briefly with her concerning possible tracheotomy. Exam of the neck, she is very obese with a very short neck, but there is no palpable masses around the trach and the trach is essentially midline.  IMPRESSION: Acute-on-chronic respiratory failure with prolonged intubation over 2 weeks, history of chronic  obstructive pulmonary disease, congestive heart failure and obstructive sleep apnea.  We will plan a tracheotomy and is scheduled for February 04, 2014, Friday morning at 7:30.     ______________________________ Leonides Sake. Lucia Gaskins, M.D.     CEN/MEDQ D: 02/02/2014 T: 02/03/2014 Job: [id number]

## 2014-02-05 LAB — MAGNESIUM: Magnesium: 1.8 mg/dL (ref 1.5–2.5)

## 2014-02-06 LAB — MAGNESIUM: Magnesium: 2.1 mg/dL (ref 1.5–2.5)

## 2014-02-07 ENCOUNTER — Encounter (HOSPITAL_COMMUNITY): Payer: Self-pay | Admitting: Otolaryngology

## 2014-02-07 ENCOUNTER — Other Ambulatory Visit (HOSPITAL_COMMUNITY): Payer: Self-pay

## 2014-02-07 DIAGNOSIS — Z930 Tracheostomy status: Secondary | ICD-10-CM

## 2014-02-07 LAB — CBC
HEMATOCRIT: 38 % (ref 36.0–46.0)
Hemoglobin: 11.5 g/dL — ABNORMAL LOW (ref 12.0–15.0)
MCH: 27.1 pg (ref 26.0–34.0)
MCHC: 30.3 g/dL (ref 30.0–36.0)
MCV: 89.6 fL (ref 78.0–100.0)
Platelets: 339 10*3/uL (ref 150–400)
RBC: 4.24 MIL/uL (ref 3.87–5.11)
RDW: 18.9 % — ABNORMAL HIGH (ref 11.5–15.5)
WBC: 10.8 10*3/uL — ABNORMAL HIGH (ref 4.0–10.5)

## 2014-02-07 LAB — BASIC METABOLIC PANEL
Anion gap: 13 (ref 5–15)
BUN: 53 mg/dL — ABNORMAL HIGH (ref 6–23)
CALCIUM: 10 mg/dL (ref 8.4–10.5)
CO2: 37 mmol/L — ABNORMAL HIGH (ref 19–32)
CREATININE: 1.24 mg/dL — AB (ref 0.50–1.10)
Chloride: 90 mEq/L — ABNORMAL LOW (ref 96–112)
GFR calc Af Amer: 55 mL/min — ABNORMAL LOW (ref >90)
GFR calc non Af Amer: 47 mL/min — ABNORMAL LOW (ref >90)
Glucose, Bld: 161 mg/dL — ABNORMAL HIGH (ref 70–99)
Potassium: 3.2 mmol/L — ABNORMAL LOW (ref 3.5–5.1)
Sodium: 140 mmol/L (ref 135–145)

## 2014-02-07 LAB — VANCOMYCIN, TROUGH

## 2014-02-07 NOTE — Progress Notes (Signed)
Name: Theresa Leon MRN: [id number] DOB: [birth year]    ADMISSION DATE:  01/25/2014 CONSULTATION DATE:  12/30  REFERRING MD :  Laren Everts   CHIEF COMPLAINT:  Respiratory failure   BRIEF PATIENT DESCRIPTION: 59 yo morbidly obese female with hx COPD, aortic valve replacement, DM with recent admission for acute respiratory failure r/t sepsis in setting labial abscess, c/b acute renal failure.  She required CVVH during that admission for volume removal.  Returned to outside hospital with acute hypoxic/hypercarbic respiratory failure and was intubated 12/23.  Tx 12/29 to [facility] for further vent weaning.  PCCM consulted for vent mgmt.   SIGNIFICANT EVENTS   12/31 cdiff pos 1/8 trach ENT    SUBJECTIVE/OVERNIGHT/INTERVAL HX 1/7 failed weaning. Full vent support  VITAL SIGNS: Vital signs reviewed. Abnormal values will appear under impression plan section.    PHYSICAL EXAMINATION: General:  Morbidly obese female, sedated but did t collar x 2 hours Neuro: RASS 0, follows commands, sedated HEENT:  Mm moist, ETT, large neck , trach cdi Cardiovascular:  s1s2 distant  Lungs:  Diminished in bases Abdomen:  Obese, round, soft, +bs  Musculoskeletal:  Warm and dry, 1-2+ BLE edema , decreased   PULMONARY No results for input(s): PHART, PCO2ART, PO2ART, HCO3, TCO2, O2SAT in the last 168 hours.  Invalid input(s): PCO2, PO2  CBC  Recent Labs Lab 02/04/14 0702 02/07/14 0530  HGB 11.0* 11.5*  HCT 36.6 38.0  WBC 8.7 10.8*  PLT 429* 339    COAGULATION  Recent Labs Lab 02/04/14 0702  INR 1.19    CARDIAC  No results for input(s): TROPONINI in the last 168 hours. No results for input(s): PROBNP in the last 168 hours.   CHEMISTRY  Recent Labs Lab 02/01/14 0630 02/02/14 0600 02/03/14 0545  02/04/14 0702 02/05/14 0531 02/06/14 0557 02/07/14 0530  NA 143  --   --   --  137  --   --  140  K 3.0* 3.1* 3.1*  < > 3.7  --   --  3.2*  CL 91*  --   --   --  89*  --   --  90*   CO2 44*  --   --   --  32  --   --  37*  GLUCOSE 113*  --   --   --  131*  --   --  161*  BUN 15  --   --   --  24*  --   --  53*  CREATININE 0.82  --   --   --  0.99  --   --  1.24*  CALCIUM 9.4  --   --   --  9.8  --   --  10.0  MG 2.0 2.0 1.9  --  2.0 1.8 2.1  --   < > = values in this interval not displayed. CrCl cannot be calculated (Unknown ideal weight.).   LIVER  Recent Labs Lab 02/04/14 0702  INR 1.19     INFECTIOUS No results for input(s): LATICACIDVEN, PROCALCITON in the last 168 hours.   ENDOCRINE CBG (last 3)  No results for input(s): GLUCAP in the last 72 hours.       IMAGING x48h Dg Chest Port 1 View  02/07/2014   CLINICAL DATA:  Respiratory failure.  EXAM: PORTABLE CHEST - 1 VIEW  COMPARISON:  02/01/2014.  FINDINGS: Interim extubation. It cannot be determined for sure however it appears that a tracheostomy tube is present. A  right IJ line is again noted in the right internal jugular vein just above the thoracic inlet. NG tube tip below the hemidiaphragm on the left. Prior CABG and cardiac valve replacement. Cardiac pacer in stable position. Persistent cardiomegaly. Interim partial clearing of pulmonary interstitial edema. Small pleural effusion. No pneumothorax. No acute osseous abnormality.  IMPRESSION: 1. Lines and tubes as above. 2. Persistent cardiomegaly. Prior CABG and cardiac valve replacement. Cardiac pacer in stable position. 3. Interim partial resolution of congestive heart failure and pulmonary interstitial edema.   Electronically Signed   By: Marcello Moores  Register   On: 02/07/2014 07:36       ASSESSMENT / PLAN:  Acute vent dependent respiratory failure - multifactorial in setting volume overload r/t decompensated heart failure c/b acute renal failure, COPD, ?HCAP and decompensated OSA/OHS.   Agitation  Obese  ETT 12/23>>>1/8 Trach ENT 1/8>>    PLAN [facility] - per ENT 1/8 Weaning per protocol Prn sedation Weight loss  Richardson Landry Minor  ACNP Maryanna Shape PCCM Pager [phone] till 3 pm If no answer page [phone]  Continue weaning per protocol.  Minimize sedation since now trached.  Wt loss as able.  PCCM will follow.  Patient seen and examined, agree with above note.  I dictated the care and orders written for this patient under my direction.  Rush Farmer, MD [phone]  02/07/2014, 10:23 AM

## 2014-02-07 NOTE — Op Note (Unsigned)
NAME:  [name], [name]                      ACCOUNT NO.:  MEDICAL RECORD NO.:  [id number]  LOCATION:                                 FACILITY:  PHYSICIAN:  Leonides Sake. Lucia Gaskins, M.D. DATE OF BIRTH:  DATE OF PROCEDURE:  02/04/2014 DATE OF DISCHARGE:                              OPERATIVE REPORT   PREOPERATIVE DIAGNOSIS:  Respiratory failure.  POSTOPERATIVE DIAGNOSIS:  Respiratory failure.  OPERATION:  Tracheotomy with a #7 Shiley proximal XLT trach.  SURGEON:  Leonides Sake. Lucia Gaskins, MD  ANESTHESIA:  General endotracheal.  COMPLICATIONS:  None.  BRIEF CLINICAL NOTE:  Theresa Leon is a 59 year old female, who was admitted to [place] about 8 days ago because of prolonged intubation and respiratory care, they tried to wean her but had been unable to extubate her and she has been on the vent now for over 2 weeks and expected prolonged intubation and recommended tracheotomy.  She had history of COPD as well as obstructive sleep apnea and severe obesity.  She was taken to the operating room at this time for tracheotomy.  DESCRIPTION OF PROCEDURE:  The patient was brought directly from [place] down to the OR.  Neck was prepped with Betadine solution and injected with 4 mL of Xylocaine with epinephrine for hemostasis.  She had a very short thick neck.  Incision was made just above the midline just below the palpable cricoid cartilage.  Dissection was carried down through subcutaneous tissue.  Strap muscles were divided.  The cricoid and upper tracheal rings were identified by palpation.  Thyroid isthmus had to be transected in order to expose the upper 3 tracheal rings.  A horizontal tracheotomy was made between the second and third tracheal rings.  The endotracheal tube was removed and then a new #7 proximal XLT Shiley tracheotomy was inserted without any difficulty.  Following this, she was well ventilated, it was secured to the neck with four 3-0 silk sutures  and Velcro trach collar around the neck.  The nasogastric tube which was placed orally was then passed through the nose and down to the stomach.  This completed the procedure.  Patient was transferred back to [place].          ______________________________ Leonides Sake. Lucia Gaskins, M.D.    CEN/MEDQ  D:  02/04/2014  T:  02/04/2014  Job:  [id number]

## 2014-02-09 LAB — BASIC METABOLIC PANEL
Anion gap: 13 (ref 5–15)
BUN: 63 mg/dL — AB (ref 6–23)
CHLORIDE: 94 meq/L — AB (ref 96–112)
CO2: 35 mmol/L — ABNORMAL HIGH (ref 19–32)
Calcium: 10.3 mg/dL (ref 8.4–10.5)
Creatinine, Ser: 1.45 mg/dL — ABNORMAL HIGH (ref 0.50–1.10)
GFR calc Af Amer: 45 mL/min — ABNORMAL LOW (ref >90)
GFR, EST NON AFRICAN AMERICAN: 39 mL/min — AB (ref >90)
GLUCOSE: 177 mg/dL — AB (ref 70–99)
POTASSIUM: 3.5 mmol/L (ref 3.5–5.1)
SODIUM: 142 mmol/L (ref 135–145)

## 2014-02-09 NOTE — Progress Notes (Signed)
Name: Theresa Leon MRN: [id number] DOB: 03/14/1955    ADMISSION DATE:  01/25/2014 CONSULTATION DATE:  12/30  REFERRING MD :  Laren Everts   CHIEF COMPLAINT:  Respiratory failure   BRIEF PATIENT DESCRIPTION: 59 yo morbidly obese female with hx COPD, aortic valve replacement, DM with recent admission for acute respiratory failure r/t sepsis in setting labial abscess, c/b acute renal failure.  She required CVVH during that admission for volume removal.  Returned to outside hospital with acute hypoxic/hypercarbic respiratory failure and was intubated 12/23.  Tx 12/29 to [facility] for further vent weaning.  PCCM consulted for vent mgmt.   SIGNIFICANT EVENTS   12/31 cdiff pos 1/8 trach ENT    SUBJECTIVE/OVERNIGHT/INTERVAL HX 1/13 on t collar  VITAL SIGNS: Vital signs reviewed. Abnormal values will appear under impression plan section.    PHYSICAL EXAMINATION: General:  Morbidly obese female, on t collar for planned 4 hours Neuro: RASS 0, follows commands, on tcollar, restrained HEENT:  Mm moist, ETT, large neck , trach cdi Cardiovascular:  s1s2 distant  Lungs:  Diminished in bases, congested cough Abdomen:  Obese, round, soft, +bs  Musculoskeletal:  Warm and dry, 1-2+ BLE edema , decreased   PULMONARY No results for input(s): PHART, PCO2ART, PO2ART, HCO3, TCO2, O2SAT in the last 168 hours.  Invalid input(s): PCO2, PO2  CBC  Recent Labs Lab 02/04/14 0702 02/07/14 0530  HGB 11.0* 11.5*  HCT 36.6 38.0  WBC 8.7 10.8*  PLT 429* 339    COAGULATION  Recent Labs Lab 02/04/14 0702  INR 1.19    CARDIAC  No results for input(s): TROPONINI in the last 168 hours. No results for input(s): PROBNP in the last 168 hours.   CHEMISTRY  Recent Labs Lab 02/03/14 0545  02/04/14 0702 02/05/14 0531 02/06/14 0557 02/07/14 0530 02/09/14 0655  NA  --   --  137  --   --  140 142  K 3.1*  < > 3.7  --   --  3.2* 3.5  CL  --   --  89*  --   --  90* 94*  CO2  --   --  32  --    --  37* 35*  GLUCOSE  --   --  131*  --   --  161* 177*  BUN  --   --  24*  --   --  53* 63*  CREATININE  --   --  0.99  --   --  1.24* 1.45*  CALCIUM  --   --  9.8  --   --  10.0 10.3  MG 1.9  --  2.0 1.8 2.1  --   --   < > = values in this interval not displayed. CrCl cannot be calculated (Unknown ideal weight.).   LIVER  Recent Labs Lab 02/04/14 0702  INR 1.19     INFECTIOUS No results for input(s): LATICACIDVEN, PROCALCITON in the last 168 hours.   ENDOCRINE CBG (last 3)  No results for input(s): GLUCAP in the last 72 hours.       IMAGING x48h No results found.     ASSESSMENT / PLAN:  Acute vent dependent respiratory failure - multifactorial in setting volume overload r/t decompensated heart failure c/b acute renal failure, COPD, ?HCAP and decompensated OSA/OHS.   Agitation  Obese  ETT 12/23>>>1/8 Trach ENT 1/8>>    PLAN [facility] - per ENT 1/8 Weaning per protocol (goal 4 hours 1/13) Prn sedation Weight  loss  We will see again on 1/18  [facility] Minor ACNP Maryanna Shape PCCM Pager [phone] till 3 pm If no answer page [phone]  Tolerated TC for 4 hours today, will advance weaning as able.  Anticipate full liberation from the ventilator over the next week.  No consideration of decannulation at this time.  Will continue to follow with you.  Patient seen and examined, agree with above note.  I dictated the care and orders written for this patient under my direction.  Rush Farmer, MD [phone]

## 2014-02-12 LAB — CBC
HCT: 38.6 % (ref 36.0–46.0)
HEMOGLOBIN: 11.2 g/dL — AB (ref 12.0–15.0)
MCH: 27 pg (ref 26.0–34.0)
MCHC: 29 g/dL — ABNORMAL LOW (ref 30.0–36.0)
MCV: 93 fL (ref 78.0–100.0)
PLATELETS: 171 10*3/uL (ref 150–400)
RBC: 4.15 MIL/uL (ref 3.87–5.11)
RDW: 18.6 % — ABNORMAL HIGH (ref 11.5–15.5)
WBC: 9.8 10*3/uL (ref 4.0–10.5)

## 2014-02-12 LAB — BASIC METABOLIC PANEL
ANION GAP: 10 (ref 5–15)
BUN: 96 mg/dL — AB (ref 6–23)
CO2: 36 mmol/L — AB (ref 19–32)
Calcium: 10.3 mg/dL (ref 8.4–10.5)
Chloride: 104 mEq/L (ref 96–112)
Creatinine, Ser: 1.97 mg/dL — ABNORMAL HIGH (ref 0.50–1.10)
GFR, EST AFRICAN AMERICAN: 31 mL/min — AB (ref >90)
GFR, EST NON AFRICAN AMERICAN: 27 mL/min — AB (ref >90)
Glucose, Bld: 200 mg/dL — ABNORMAL HIGH (ref 70–99)
Potassium: 3.9 mmol/L (ref 3.5–5.1)
Sodium: 150 mmol/L — ABNORMAL HIGH (ref 135–145)

## 2014-02-13 ENCOUNTER — Other Ambulatory Visit (HOSPITAL_COMMUNITY): Payer: Self-pay

## 2014-02-13 LAB — BASIC METABOLIC PANEL
Anion gap: 6 (ref 5–15)
BUN: 93 mg/dL — ABNORMAL HIGH (ref 6–23)
CALCIUM: 10.3 mg/dL (ref 8.4–10.5)
CO2: 38 mmol/L — ABNORMAL HIGH (ref 19–32)
CREATININE: 1.74 mg/dL — AB (ref 0.50–1.10)
Chloride: 108 mEq/L (ref 96–112)
GFR calc Af Amer: 36 mL/min — ABNORMAL LOW (ref >90)
GFR, EST NON AFRICAN AMERICAN: 31 mL/min — AB (ref >90)
GLUCOSE: 203 mg/dL — AB (ref 70–99)
POTASSIUM: 5.1 mmol/L (ref 3.5–5.1)
Sodium: 152 mmol/L — ABNORMAL HIGH (ref 135–145)

## 2014-02-14 LAB — BASIC METABOLIC PANEL
ANION GAP: 8 (ref 5–15)
BUN: 100 mg/dL — ABNORMAL HIGH (ref 6–23)
CALCIUM: 9.9 mg/dL (ref 8.4–10.5)
CO2: 32 mmol/L (ref 19–32)
Chloride: 107 mEq/L (ref 96–112)
Creatinine, Ser: 2.13 mg/dL — ABNORMAL HIGH (ref 0.50–1.10)
GFR calc Af Amer: 28 mL/min — ABNORMAL LOW (ref >90)
GFR, EST NON AFRICAN AMERICAN: 25 mL/min — AB (ref >90)
GLUCOSE: 231 mg/dL — AB (ref 70–99)
POTASSIUM: 4.8 mmol/L (ref 3.5–5.1)
Sodium: 147 mmol/L — ABNORMAL HIGH (ref 135–145)

## 2014-02-14 LAB — URINALYSIS, ROUTINE W REFLEX MICROSCOPIC
Bilirubin Urine: NEGATIVE
Glucose, UA: NEGATIVE mg/dL
KETONES UR: NEGATIVE mg/dL
NITRITE: NEGATIVE
PH: 5 (ref 5.0–8.0)
Protein, ur: NEGATIVE mg/dL
SPECIFIC GRAVITY, URINE: 1.023 (ref 1.005–1.030)
UROBILINOGEN UA: 0.2 mg/dL (ref 0.0–1.0)

## 2014-02-14 LAB — URINE MICROSCOPIC-ADD ON

## 2014-02-14 LAB — CLOSTRIDIUM DIFFICILE BY PCR: Toxigenic C. Difficile by PCR: POSITIVE — AB

## 2014-02-14 NOTE — Progress Notes (Signed)
Name: Theresa Leon MRN: [id number] DOB: 06-Oct-1955    ADMISSION DATE:  01/25/2014 CONSULTATION DATE:  12/30  REFERRING MD :  Laren Everts   CHIEF COMPLAINT:  Respiratory failure   BRIEF PATIENT DESCRIPTION: 59 yo morbidly obese female with hx COPD, aortic valve replacement, DM with recent admission for acute respiratory failure r/t sepsis in setting labial abscess, c/b acute renal failure.  She required CVVH during that admission for volume removal.  Returned to outside hospital with acute hypoxic/hypercarbic respiratory failure and was intubated 12/23.  Tx 12/29 to [facility] for further vent weaning.  PCCM consulted for vent mgmt.   SIGNIFICANT EVENTS   12/31 cdiff pos 1/8 trach ENT    SUBJECTIVE/OVERNIGHT/INTERVAL HX On ATC. Secretions thicker. Less responsive   VITAL SIGNS: Vital signs reviewed. Abnormal values will appear under impression plan section.    PHYSICAL EXAMINATION: General:  Morbidly obese female, on t collar for planned 4 hours Neuro: RASS -2, follows commands, on tcollar, restrained HEENT:  Mm moist, trach w/ purulent secretions Cardiovascular:  s1s2 distant  Lungs:  Diminished in bases, congested cough Abdomen:  Obese, round, soft, +bs  Musculoskeletal:  Warm and dry, 1-2+ BLE edema , decreased   PULMONARY No results for input(s): PHART, PCO2ART, PO2ART, HCO3, TCO2, O2SAT in the last 168 hours.  Invalid input(s): PCO2, PO2  CBC  Recent Labs Lab 02/12/14 0630  HGB 11.2*  HCT 38.6  WBC 9.8  PLT 171    COAGULATION No results for input(s): INR in the last 168 hours.  CARDIAC  No results for input(s): TROPONINI in the last 168 hours. No results for input(s): PROBNP in the last 168 hours.   CHEMISTRY  Recent Labs Lab 02/09/14 0655 02/12/14 0630 02/13/14 1041 02/14/14 0600  NA 142 150* 152* 147*  K 3.5 3.9 5.1 4.8  CL 94* 104 108 107  CO2 35* 36* 38* 32  GLUCOSE 177* 200* 203* 231*  BUN 63* 96* 93* 100*  CREATININE 1.45* 1.97*  1.74* 2.13*  CALCIUM 10.3 10.3 10.3 9.9   CrCl cannot be calculated (Unknown ideal weight.).   LIVER No results for input(s): AST, ALT, ALKPHOS, BILITOT, PROT, ALBUMIN, INR in the last 168 hours.   INFECTIOUS No results for input(s): LATICACIDVEN, PROCALCITON in the last 168 hours.   ENDOCRINE CBG (last 3)  No results for input(s): GLUCAP in the last 72 hours.       IMAGING x48h Dg Chest Port 1 View  02/13/2014   CLINICAL DATA:  Respiratory failure.  EXAM: PORTABLE CHEST - 1 VIEW  COMPARISON:  02/07/2014  FINDINGS: Prior CABG. Tracheostomy tube projects over the mid trachea. NG tube enters the stomach. Left pacer is unchanged. There are low lung volumes with cardiomegaly and vascular congestion. No confluent opacities, effusions or edema.  IMPRESSION: Low lung volumes.  Cardiomegaly with vascular congestion.   Electronically Signed   By: Rolm Baptise M.D.   On: 02/13/2014 21:50       ASSESSMENT / PLAN:  Acute vent dependent respiratory failure/ now trach dependent (ENT 1/8)  - multifactorial in setting volume overload r/t decompensated heart failure c/b acute renal failure, COPD, ?HCAP and decompensated OSA/OHS.   HCAP Cdiff colitis  Acute encephalopathy  Obese  Discussion Now w/ fever and purulent sputum. Worrisome that this is a new HCAP. She also has Cdiff and her MS is worse, which is likely metabolic in origin. Have discussed her care at length in Pulmonary rounds. Need to treat acute infections, continue  supportive care and hopefully mental status will improve.   PLAN -  abx per primary service  Weaning/ATC  per protocol Careful w/ sedating meds  Prn sedation Weight loss Doubt she will be a candidate for decannulation anytime soon   Erick Colace ACNP-BC [place] Pager # [phone] OR # [phone] if no answer  Remove vent from room.  Maintain on TC.  Titrate O2 down as able.  Mental status and chronic illness precludes any chances of  decannulation now.  Would recommend trach SNF placement and if improves dramatically physically as well as mentally may consider decannulation later.  Once discharged arrange follow up in the trach clinic and we can evaluate for decannulation there.  PCCM will sign off, please call back if needed.  Patient seen and examined, agree with above note.  I dictated the care and orders written for this patient under my direction.  Rush Farmer, MD [phone]

## 2014-02-16 LAB — BASIC METABOLIC PANEL
Anion gap: 11 (ref 5–15)
BUN: 128 mg/dL — AB (ref 6–23)
CO2: 31 mmol/L (ref 19–32)
CREATININE: 2.65 mg/dL — AB (ref 0.50–1.10)
Calcium: 9.5 mg/dL (ref 8.4–10.5)
Chloride: 109 mEq/L (ref 96–112)
GFR calc Af Amer: 22 mL/min — ABNORMAL LOW (ref >90)
GFR, EST NON AFRICAN AMERICAN: 19 mL/min — AB (ref >90)
GLUCOSE: 182 mg/dL — AB (ref 70–99)
Potassium: 4 mmol/L (ref 3.5–5.1)
Sodium: 151 mmol/L — ABNORMAL HIGH (ref 135–145)

## 2014-02-16 LAB — CULTURE, RESPIRATORY W GRAM STAIN

## 2014-02-16 LAB — URINE CULTURE: Colony Count: >=100000

## 2014-02-16 LAB — CBC
HCT: 35.2 % — ABNORMAL LOW (ref 36.0–46.0)
HEMOGLOBIN: 10.6 g/dL — AB (ref 12.0–15.0)
MCH: 27.7 pg (ref 26.0–34.0)
MCHC: 30.1 g/dL (ref 30.0–36.0)
MCV: 92.1 fL (ref 78.0–100.0)
PLATELETS: 75 10*3/uL — AB (ref 150–400)
RBC: 3.82 MIL/uL — ABNORMAL LOW (ref 3.87–5.11)
RDW: 18.6 % — ABNORMAL HIGH (ref 11.5–15.5)
WBC: 9.5 10*3/uL (ref 4.0–10.5)

## 2014-02-16 LAB — CULTURE, RESPIRATORY

## 2014-02-18 ENCOUNTER — Other Ambulatory Visit (HOSPITAL_COMMUNITY): Payer: Self-pay

## 2014-02-18 LAB — CBC
HEMATOCRIT: 33 % — AB (ref 36.0–46.0)
Hemoglobin: 9.8 g/dL — ABNORMAL LOW (ref 12.0–15.0)
MCH: 27.4 pg (ref 26.0–34.0)
MCHC: 29.7 g/dL — AB (ref 30.0–36.0)
MCV: 92.2 fL (ref 78.0–100.0)
Platelets: 83 10*3/uL — ABNORMAL LOW (ref 150–400)
RBC: 3.58 MIL/uL — AB (ref 3.87–5.11)
RDW: 17.5 % — ABNORMAL HIGH (ref 11.5–15.5)
WBC: 6.4 10*3/uL (ref 4.0–10.5)

## 2014-02-18 LAB — COMPREHENSIVE METABOLIC PANEL
ALT: 12 U/L (ref 0–35)
AST: 17 U/L (ref 0–37)
Albumin: 2.3 g/dL — ABNORMAL LOW (ref 3.5–5.2)
Alkaline Phosphatase: 65 U/L (ref 39–117)
Anion gap: 7 (ref 5–15)
BILIRUBIN TOTAL: 0.7 mg/dL (ref 0.3–1.2)
BUN: 65 mg/dL — ABNORMAL HIGH (ref 6–23)
CHLORIDE: 108 meq/L (ref 96–112)
CO2: 32 mmol/L (ref 19–32)
Calcium: 9.2 mg/dL (ref 8.4–10.5)
Creatinine, Ser: 1.61 mg/dL — ABNORMAL HIGH (ref 0.50–1.10)
GFR calc Af Amer: 40 mL/min — ABNORMAL LOW (ref >90)
GFR, EST NON AFRICAN AMERICAN: 34 mL/min — AB (ref >90)
GLUCOSE: 143 mg/dL — AB (ref 70–99)
Potassium: 4 mmol/L (ref 3.5–5.1)
Sodium: 147 mmol/L — ABNORMAL HIGH (ref 135–145)
TOTAL PROTEIN: 6.8 g/dL (ref 6.0–8.3)

## 2014-02-20 LAB — CULTURE, BLOOD (ROUTINE X 2)
CULTURE: NO GROWTH
Culture: NO GROWTH

## 2014-02-21 LAB — BASIC METABOLIC PANEL
ANION GAP: 10 (ref 5–15)
BUN: 19 mg/dL (ref 6–23)
CO2: 26 mmol/L (ref 19–32)
Calcium: 9.9 mg/dL (ref 8.4–10.5)
Chloride: 109 mmol/L (ref 96–112)
Creatinine, Ser: 1 mg/dL (ref 0.50–1.10)
GFR calc Af Amer: 71 mL/min — ABNORMAL LOW (ref >90)
GFR calc non Af Amer: 61 mL/min — ABNORMAL LOW (ref >90)
GLUCOSE: 100 mg/dL — AB (ref 70–99)
POTASSIUM: 4.7 mmol/L (ref 3.5–5.1)
Sodium: 145 mmol/L (ref 135–145)

## 2014-02-22 ENCOUNTER — Other Ambulatory Visit (HOSPITAL_COMMUNITY): Payer: Self-pay

## 2014-02-22 ENCOUNTER — Encounter: Payer: Self-pay | Admitting: Radiology

## 2014-02-23 DIAGNOSIS — R1310 Dysphagia, unspecified: Secondary | ICD-10-CM | POA: Insufficient documentation

## 2014-02-23 LAB — GRAM STAIN

## 2014-02-23 NOTE — H&P (Signed)
Chief Complaint: Deconditioning failure to thrive  Referring Physician(s): Dr Laren Everts   History of Present Illness: Theresa Leon is a 59 y.o. female   In [place] COPD; resp failure deconditioning FTT Need for long term care Request for percutaneous gastric tube placement Dr Anselm Pancoast has reviewed imaging and approves procedure  Past Medical History  Diagnosis Date  . Asthma 1975  . Diabetes mellitus without complication 2956  . Heart disease   . 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 COPD (chronic obstructive pulmonary disease)   . OSA (obstructive sleep apnea)   . Diastolic CHF, acute on chronic   . Acute on chronic renal failure   . Anemia   . Diabetes   . H/O aortic valve replacement   . Morbid obesity   . Hypothyroid     Past Surgical History  Procedure Laterality Date  . Back surgery      AS CHILD  . Phrenic nerve pacemaker implantation  2009  . Aortic valve relaced   2008  . Cholecystectomy  2007  . Breast surgery  September 25, 2011    intraductal papilloma of the left breast  . Tracheostomy tube placement N/A 02/04/2014    Procedure: TRACHEOSTOMY;  Surgeon: Rozetta Nunnery, MD;  Location: [facility] OR;  Service: ENT;  Laterality: N/A;    Allergies: Mold extract and Nitroglycerin  Medications: Prior to Admission medications   Medication Sig Start Date End Date Taking? Authorizing Provider  albuterol (PROVENTIL HFA;VENTOLIN HFA) 108 (90 BASE) MCG/ACT inhaler Inhale 2 puffs into the lungs every 6 (six) hours as needed for wheezing.    Historical Provider, MD  Calcium Carbonate-Vitamin D (CALCIUM + D PO) Take 1 tablet by mouth daily.    Historical Provider, MD  citalopram (CELEXA) 20 MG tablet Take 1 tablet by mouth 2 (two) times daily. 07/10/12   Historical Provider, MD  diclofenac (VOLTAREN) 75 MG EC tablet Take 75 mg by mouth 2 (two) times daily.    Historical Provider, MD  EPINEPHrine (EPIPEN) 0.3 mg/0.3 mL  DEVI Inject into the muscle once.    Historical Provider, MD  fluticasone (FLONASE) 50 MCG/ACT nasal spray Place 2 sprays into the nose daily as needed. 06/06/12   Historical Provider, MD  gabapentin (NEURONTIN) 600 MG tablet Take 600 mg by mouth 3 (three) times daily.    Historical Provider, MD  hydrochlorothiazide (HYDRODIURIL) 25 MG tablet Take 1 tablet by mouth daily. 07/10/12   Historical Provider, MD  HYDROcodone-acetaminophen (NORCO/VICODIN) 5-325 MG per tablet Take 1 tablet by mouth 2 (two) times daily. 07/03/12   Historical Provider, MD  levothyroxine (SYNTHROID, LEVOTHROID) 100 MCG tablet Take 100 mcg by mouth daily.    Historical Provider, MD  loratadine (CLARITIN) 10 MG tablet Take 10 mg by mouth daily.    Historical Provider, MD  meclizine (ANTIVERT) 25 MG tablet Take 1 tablet by mouth every 6 (six) hours. 06/12/12   Historical Provider, MD  montelukast (SINGULAIR) 10 MG tablet Take 10 mg by mouth at bedtime.    Historical Provider, MD  omeprazole (PRILOSEC) 40 MG capsule Take 40 mg by mouth daily.    Historical Provider, MD  ranitidine (ZANTAC) 150 MG capsule Take 150 mg by mouth 2 (two) times daily.    Historical Provider, MD  SOTALOL AF 80 MG TABS Take 1 tablet by mouth 2 (two) times daily. 07/10/12   Historical Provider, MD  traZODone (DESYREL) 50 MG tablet  Take 1 tablet by mouth at bedtime as needed. 07/10/12   Historical Provider, MD    Family History  Problem Relation Age of Onset  . Skin telangiectasia Mother   . Lung cancer Maternal Grandmother   . Lung cancer Paternal Grandmother   . Breast cancer Maternal Grandmother     History   Social History  . Marital Status: Widowed    Spouse Name: N/A    Number of Children: N/A  . Years of Education: N/A   Social History Main Topics  . Smoking status: Former Research scientist (life sciences)  . Smokeless tobacco: None  . Alcohol Use: No  . Drug Use: No  . Sexual Activity: None   Other Topics Concern  . None   Social History Narrative      Review of Systems: A 12 point ROS discussed and pertinent positives are indicated in the HPI above.  All other systems are negative.  Review of Systems  Respiratory:       Resp failure Trach  Neurological: Positive for weakness.    Vital Signs: There were no vitals taken for this visit.  Physical Exam  Cardiovascular: Normal rate and regular rhythm.   Pulmonary/Chest: Effort normal. She has no wheezes.  Abdominal: Soft. Bowel sounds are normal.  Musculoskeletal:  Does move all 4s to command  Neurological:  Sluggish; medicated  Skin: Skin is warm and dry.  Psychiatric:  Consented with son via phone  Nursing note and vitals reviewed.   Imaging: Ct Abdomen Wo Contrast  02/22/2014   CLINICAL DATA:  Evaluate for nasogastric catheter placement  EXAM: CT ABDOMEN WITHOUT CONTRAST  TECHNIQUE: Multidetector CT imaging of the abdomen was performed following the standard protocol without IV contrast.  COMPARISON:  Plain film from 02/18/2014  FINDINGS: Lung bases are free of acute infiltrate or sizable effusion.  The gallbladder has been surgically removed. The liver appears within normal limits. The spleen is mildly prominent but maintains its normal splenic form shape. The pancreas is within normal limits as is the right adrenal gland. A small 1 cm lesion is noted arising from the left adrenal gland likely representing a small adenoma.  The kidneys are well visualized and demonstrates some nonobstructing stones bilaterally. No obstructive changes are seen. The visualized portion of the appendix is within normal limits. A small fat containing umbilical hernia is seen.  The previously visualized nasogastric catheter is not seen on these images an likely were lies within the midesophagus. Clinical correlation is recommended.  IMPRESSION: Nasogastric catheter not visualized on this exam and likely lying within the midesophagus.  Left adrenal adenoma.  Nonobstructing small renal calculi  bilaterally   Electronically Signed   By: Inez Catalina M.D.   On: 02/22/2014 21:46   Dg Abd 1 View  02/04/2014   CLINICAL DATA:  NG tube placement.  EXAM: ABDOMEN - 1 VIEW  COMPARISON:  One view abdomen from the same day.  FINDINGS: The tip of an NG tube is in the fundus the stomach. The side port is in the distal esophagus. The bowel gas pattern is unremarkable. Surgical clips are present at the gallbladder fossa.  IMPRESSION: 1. The tip the NG tube is now in the fundus the stomach. The side port is in the distal esophagus.   Electronically Signed   By: Lawrence Santiago M.D.   On: 02/04/2014 12:12   Dg Chest Port 1 View  02/18/2014   CLINICAL DATA:  Right IJ catheter placement  EXAM: PORTABLE CHEST - 1 VIEW  COMPARISON:  Earlier today  FINDINGS: Tracheostomy tube tip is above the carina. There is a nasogastric tube which has a loop in the oral pharynx and tip below the GE junction. Left chest wall pacer device is noted with lead in the right atrial appendage and right ventricle. The patient is status post median sternotomy and CABG procedure. The right IJ catheter is unchanged in position with tip in the right side of the neck. Heart size appears mildly enlarged. There are low lung volumes with bibasilar atelectasis.  IMPRESSION: 1. No significant change in position of right IJ catheter with tip in the right side of neck. 2. The nasogastric tube appears looped within the oral pharynx   Electronically Signed   By: Kerby Moors M.D.   On: 02/18/2014 16:03   Dg Chest Port 1 View  02/18/2014   CLINICAL DATA:  Encounter for central line placement  EXAM: PORTABLE CHEST - 1 VIEW  COMPARISON:  February 18, 2014 5:48 a.m.  FINDINGS: The heart size and mediastinal contours are stable. Tracheostomy tube, nasogastric tube, cardiac pacemaker are unchanged. There is a right IJ catheter with tip in the neck. Both lungs are clear. There is no pleural effusion or focal pneumonia. The visualized skeletal structures are  stable.  IMPRESSION: Right IJ central line tip is in the neck. A intravenous location cannot be confirmed. There is no pneumothorax.  These results will be called to the ordering clinician or representative by the Radiologist Assistant, and communication documented in the PACS or zVision Dashboard.   Electronically Signed   By: Abelardo Diesel M.D.   On: 02/18/2014 13:29   Dg Chest Port 1 View  02/18/2014   CLINICAL DATA:  Respiratory failure  EXAM: PORTABLE CHEST - 1 VIEW  COMPARISON:  02/13/2014  FINDINGS: Tracheostomy tube remains well seated. Unchanged orientation of dual-chamber pacer leads from the left. A gastric suction tube continues below the diaphragm. There is a right IJ catheter with tip in the neck, intravenous positioning uncertain. Compared to chest x-ray from 01/31/2014, this is been retracted.  There is cardiomegaly and aortic tortuosity which is stable. The patient has undergone aortic valve replacement. No pulmonary edema or pleural effusion. No pneumothorax.  These results were called by telephone at the time of interpretation on 02/18/2014 at 6:36 am to [place], who verbally acknowledged these results.  IMPRESSION: 1. Right IJ central line tip is in neck, retracted since 01/31/2014. An intravenous location cannot be confirmed. 2. Cardiomegaly and hypoaeration.   Electronically Signed   By: Jorje Guild M.D.   On: 02/18/2014 06:37   Dg Chest Port 1 View  02/13/2014   CLINICAL DATA:  Respiratory failure.  EXAM: PORTABLE CHEST - 1 VIEW  COMPARISON:  02/07/2014  FINDINGS: Prior CABG. Tracheostomy tube projects over the mid trachea. NG tube enters the stomach. Left pacer is unchanged. There are low lung volumes with cardiomegaly and vascular congestion. No confluent opacities, effusions or edema.  IMPRESSION: Low lung volumes.  Cardiomegaly with vascular congestion.   Electronically Signed   By: Rolm Baptise M.D.   On: 02/13/2014 21:50   Dg Chest Port 1 View  02/07/2014   CLINICAL DATA:   Respiratory failure.  EXAM: PORTABLE CHEST - 1 VIEW  COMPARISON:  02/01/2014.  FINDINGS: Interim extubation. It cannot be determined for sure however it appears that a tracheostomy tube is present. A right IJ line is again noted in the right internal jugular vein just above the thoracic inlet. NG tube  tip below the hemidiaphragm on the left. Prior CABG and cardiac valve replacement. Cardiac pacer in stable position. Persistent cardiomegaly. Interim partial clearing of pulmonary interstitial edema. Small pleural effusion. No pneumothorax. No acute osseous abnormality.  IMPRESSION: 1. Lines and tubes as above. 2. Persistent cardiomegaly. Prior CABG and cardiac valve replacement. Cardiac pacer in stable position. 3. Interim partial resolution of congestive heart failure and pulmonary interstitial edema.   Electronically Signed   By: Marcello Moores  Register   On: 02/07/2014 07:36   Dg Chest Port 1 View  02/01/2014   CLINICAL DATA:  Acute respiratory failure .  EXAM: PORTABLE CHEST - 1 VIEW  COMPARISON:  01/31/2014.  FINDINGS: Endotracheal tube in stable position. Right IJ line noted with tip projected over the right internal jugular vein at the level of the thoracic inlet. NG tube noted. Lower hemidiaphragms not imaged. Cardiac pacer noted with lead tips in the right atrium and projected over the right ventricle. Prior CABG. Cardiomegaly with pulmonary venous congestion and interstitial prominence again noted. Right pleural effusions present. These findings are consistent with persistent congestive heart failure clear underlying pneumonia cannot be excluded. No pneumothorax.  IMPRESSION: 1. Lines and tubes unchanged as described above. 2. Persistent changes of congestive heart failure with pulmonary interstitial edema and small right pleural effusion. Cardiac pacer in stable position. Prior CABG   Electronically Signed   By: Marcello Moores  Register   On: 02/01/2014 07:58   Dg Chest Port 1 View  01/31/2014   CLINICAL DATA:   Evaluate CHF  EXAM: PORTABLE CHEST - 1 VIEW  COMPARISON:  01/28/2014  FINDINGS: Support devices are Stable. There is cardiomegaly. Prior CABG. Bilateral airspace opacities are again noted, right greater than left, worsening since prior study. This could represent asymmetric edema or infection. Suspect layering effusions.  IMPRESSION: Worsening aeration and bilateral airspace disease, right greater than left. This could represent asymmetric edema or infection.  Suspect small layering bilateral effusions.   Electronically Signed   By: Rolm Baptise M.D.   On: 01/31/2014 08:03   Dg Chest Port 1 View  01/28/2014   CLINICAL DATA:  Respiratory failure  EXAM: PORTABLE CHEST - 1 VIEW  COMPARISON:  the previous day's study  FINDINGS: Endotracheal tube and nasogastric tube stable in position. Right IJ catheter, tip above the thoracic inlet. Left subclavian transvenous pacing leads stable. Previous median sternotomy. Low lung volumes. Heart size mildly enlarged, probably emphasized by technique and low volumes. Patchy airspace opacities throughout both lungs, slightly improved since previous study. Suspect layering right pleural effusion. No pneumothorax.  IMPRESSION: 1. Partial improvement in bilateral edema/infiltrates. 2.  Support hardware stable in position.   Electronically Signed   By: Arne Cleveland M.D.   On: 01/28/2014 08:58   Dg Chest Port 1 View  01/27/2014   CLINICAL DATA:  Respiratory failure.  EXAM: PORTABLE CHEST - 1 VIEW  COMPARISON:  01/25/2014  FINDINGS: Endotracheal tube remains with the tip approximately 4 cm above the carina. The right jugular central line appears to have retracted with the tip now at the base of the neck in the jugular vein. Lungs show persistent edema and infiltrate versus asymmetric edema in the right lung. No significant pleural effusions. No pneumothorax identified.  IMPRESSION: Persistent edema with some asymmetry with increased prominence of edema/ airspace disease in the  right lung. Central line tip appears retracted and now lies at the base of the neck in the jugular vein.   Electronically Signed   By: Aletta Edouard  M.D.   On: 01/27/2014 08:16   Dg Chest Port 1 View  01/25/2014   CLINICAL DATA:  Evaluate endotracheal tube  EXAM: PORTABLE CHEST - 1 VIEW  COMPARISON:  01/24/2014  FINDINGS: Endotracheal tube terminates 3.5 cm above the carina.  Multifocal patchy opacities, right lower lobe predominant, suspicious for pneumonia. Superimposed mild interstitial edema is possible. No pleural effusion or pneumothorax.  Cardiomegaly.  IMPRESSION: Endotracheal tube terminates 3.5 cm above the carina.  Multifocal patchy opacities, right lower lobe predominant, suspicious for pneumonia.  Superimposed mild interstitial edema is possible.   Electronically Signed   By: Julian Hy M.D.   On: 01/25/2014 20:55   Dg Abd Portable 1v  02/04/2014   CLINICAL DATA:  Check NG placement  EXAM: PORTABLE ABDOMEN - 1 VIEW  COMPARISON:  01/25/2014  FINDINGS: The previously seen nasogastric catheter has been removed. A newly placed tube is not visualized on this exam indicating that it lies proximal to the gastroesophageal junction. Nonobstructive bowel gas pattern is noted.  IMPRESSION: No nasogastric catheter is noted on this limited single view of the abdomen.   Electronically Signed   By: Inez Catalina M.D.   On: 02/04/2014 11:03   Dg Abd Portable 1v  01/25/2014   CLINICAL DATA:  NG tube placement  EXAM: PORTABLE ABDOMEN - 1 VIEW  COMPARISON:  01/17/2014  FINDINGS: Enteric tube is looped in the gastric body.  IMPRESSION: Enteric tube is looped in the gastric body.   Electronically Signed   By: Julian Hy M.D.   On: 01/25/2014 20:58    Labs:  CBC:  Recent Labs  02/07/14 0530 02/12/14 0630 02/16/14 0805 02/18/14 1334  WBC 10.8* 9.8 9.5 6.4  HGB 11.5* 11.2* 10.6* 9.8*  HCT 38.0 38.6 35.2* 33.0*  PLT 339 171 75* 83*    COAGS:  Recent Labs  02/04/14 0702  INR 1.19     BMP:  Recent Labs  02/14/14 0600 02/16/14 0805 02/18/14 0610 02/21/14 0643  NA 147* 151* 147* 145  K 4.8 4.0 4.0 4.7  CL 107 109 108 109  CO2 32 31 32 26  GLUCOSE 231* 182* 143* 100*  BUN 100* 128* 65* 19  CALCIUM 9.9 9.5 9.2 9.9  CREATININE 2.13* 2.65* 1.61* 1.00  GFRNONAA 25* 19* 34* 61*  GFRAA 28* 22* 40* 71*    LIVER FUNCTION TESTS:  Recent Labs  01/26/14 0630 02/18/14 0610  BILITOT 2.4* 0.7  AST 51* 17  ALT 30 12  ALKPHOS 71 65  PROT 5.5* 6.8  ALBUMIN 1.8* 2.3*    TUMOR MARKERS: No results for input(s): AFPTM, CEA, CA199, CHROMGRNA in the last 8760 hours.  Assessment and Plan:  FTT; need for long term care Trach Scheduled for perc G tube placement 1/27 Afeb; ancef on call Check labs Son is aware of procedure benefits and risks including but not limited to:  Infection; bleeding; organ damage Agreeable to proceed  Thank you for this interesting consult.  I greatly enjoyed meeting MERINA BEHRENDT and look forward to participating in their care.  Signed: Shaan Rhoads A 02/23/2014, 1:18 PM   I spent a total of 40 minutes face to face in clinical consultation, greater than 50% of which was counseling/coordinating care for perc Gastric tube placement

## 2014-02-24 ENCOUNTER — Other Ambulatory Visit (HOSPITAL_COMMUNITY): Payer: Self-pay

## 2014-02-24 LAB — APTT: APTT: 33 s (ref 24–37)

## 2014-02-24 LAB — BASIC METABOLIC PANEL
Anion gap: 6 (ref 5–15)
BUN: 16 mg/dL (ref 6–23)
CHLORIDE: 108 mmol/L (ref 96–112)
CO2: 29 mmol/L (ref 19–32)
Calcium: 9.9 mg/dL (ref 8.4–10.5)
Creatinine, Ser: 0.89 mg/dL (ref 0.50–1.10)
GFR calc Af Amer: 82 mL/min — ABNORMAL LOW (ref >90)
GFR calc non Af Amer: 71 mL/min — ABNORMAL LOW (ref >90)
Glucose, Bld: 106 mg/dL — ABNORMAL HIGH (ref 70–99)
POTASSIUM: 4.1 mmol/L (ref 3.5–5.1)
Sodium: 143 mmol/L (ref 135–145)

## 2014-02-24 LAB — CBC
HCT: 32.3 % — ABNORMAL LOW (ref 36.0–46.0)
Hemoglobin: 9.6 g/dL — ABNORMAL LOW (ref 12.0–15.0)
MCH: 27 pg (ref 26.0–34.0)
MCHC: 29.7 g/dL — ABNORMAL LOW (ref 30.0–36.0)
MCV: 90.7 fL (ref 78.0–100.0)
PLATELETS: 120 10*3/uL — AB (ref 150–400)
RBC: 3.56 MIL/uL — ABNORMAL LOW (ref 3.87–5.11)
RDW: 17.1 % — ABNORMAL HIGH (ref 11.5–15.5)
WBC: 6.5 10*3/uL (ref 4.0–10.5)

## 2014-02-24 LAB — PROTIME-INR
INR: 1.18 (ref 0.00–1.49)
Prothrombin Time: 15.2 seconds (ref 11.6–15.2)

## 2014-02-24 MED ORDER — FENTANYL CITRATE 0.05 MG/ML IJ SOLN
INTRAMUSCULAR | Status: AC | PRN
  Administered 2014-02-24: 50 ug via INTRAVENOUS

## 2014-02-24 MED ORDER — FENTANYL CITRATE 0.05 MG/ML IJ SOLN
INTRAMUSCULAR | Status: AC
  Filled 2014-02-24: qty 4

## 2014-02-24 MED ORDER — IOHEXOL 300 MG/ML  SOLN
50.0000 mL | Freq: Once | INTRAMUSCULAR | Status: AC | PRN
  Administered 2014-02-24: 15 mL

## 2014-02-24 MED ORDER — LIDOCAINE HCL 1 % IJ SOLN
INTRAMUSCULAR | Status: AC
  Filled 2014-02-24: qty 20

## 2014-02-24 MED ORDER — CEFAZOLIN SODIUM-DEXTROSE 2-3 GM-% IV SOLR
INTRAVENOUS | Status: AC
  Administered 2014-02-24: 2 g via INTRAVENOUS
  Filled 2014-02-24: qty 50

## 2014-02-24 MED ORDER — MIDAZOLAM HCL 2 MG/2ML IJ SOLN
INTRAMUSCULAR | Status: AC | PRN
  Administered 2014-02-24: 1 mg via INTRAVENOUS

## 2014-02-24 MED ORDER — MIDAZOLAM HCL 2 MG/2ML IJ SOLN
INTRAMUSCULAR | Status: AC
  Filled 2014-02-24: qty 4

## 2014-02-24 NOTE — Procedures (Signed)
67F gastrostomy tube placement under fluoro No complication No blood loss. See complete dictation in [facility].

## 2014-02-26 LAB — CULTURE, RESPIRATORY

## 2014-02-26 LAB — BASIC METABOLIC PANEL
ANION GAP: 4 — AB (ref 5–15)
BUN: 10 mg/dL (ref 6–23)
CO2: 31 mmol/L (ref 19–32)
CREATININE: 0.89 mg/dL (ref 0.50–1.10)
Calcium: 9.4 mg/dL (ref 8.4–10.5)
Chloride: 107 mmol/L (ref 96–112)
GFR calc non Af Amer: 71 mL/min — ABNORMAL LOW (ref >90)
GFR, EST AFRICAN AMERICAN: 82 mL/min — AB (ref >90)
Glucose, Bld: 101 mg/dL — ABNORMAL HIGH (ref 70–99)
Potassium: 3.8 mmol/L (ref 3.5–5.1)
SODIUM: 142 mmol/L (ref 135–145)

## 2014-02-26 LAB — CBC
HCT: 31.1 % — ABNORMAL LOW (ref 36.0–46.0)
HEMOGLOBIN: 9.4 g/dL — AB (ref 12.0–15.0)
MCH: 27.2 pg (ref 26.0–34.0)
MCHC: 30.2 g/dL (ref 30.0–36.0)
MCV: 89.9 fL (ref 78.0–100.0)
PLATELETS: 133 10*3/uL — AB (ref 150–400)
RBC: 3.46 MIL/uL — ABNORMAL LOW (ref 3.87–5.11)
RDW: 17.4 % — ABNORMAL HIGH (ref 11.5–15.5)
WBC: 5.5 10*3/uL (ref 4.0–10.5)

## 2014-02-26 LAB — CULTURE, RESPIRATORY W GRAM STAIN

## 2014-03-03 LAB — BASIC METABOLIC PANEL
ANION GAP: 3 — AB (ref 5–15)
BUN: 8 mg/dL (ref 6–23)
CO2: 37 mmol/L — ABNORMAL HIGH (ref 19–32)
CREATININE: 1.06 mg/dL (ref 0.50–1.10)
Calcium: 9 mg/dL (ref 8.4–10.5)
Chloride: 99 mmol/L (ref 96–112)
GFR calc Af Amer: 66 mL/min — ABNORMAL LOW (ref >90)
GFR, EST NON AFRICAN AMERICAN: 57 mL/min — AB (ref >90)
Glucose, Bld: 117 mg/dL — ABNORMAL HIGH (ref 70–99)
POTASSIUM: 4.5 mmol/L (ref 3.5–5.1)
SODIUM: 139 mmol/L (ref 135–145)

## 2014-03-04 LAB — CBC
HCT: 31.4 % — ABNORMAL LOW (ref 36.0–46.0)
Hemoglobin: 9.6 g/dL — ABNORMAL LOW (ref 12.0–15.0)
MCH: 27.7 pg (ref 26.0–34.0)
MCHC: 30.6 g/dL (ref 30.0–36.0)
MCV: 90.5 fL (ref 78.0–100.0)
PLATELETS: 131 10*3/uL — AB (ref 150–400)
RBC: 3.47 MIL/uL — AB (ref 3.87–5.11)
RDW: 17.9 % — AB (ref 11.5–15.5)
WBC: 4.6 10*3/uL (ref 4.0–10.5)

## 2014-03-07 ENCOUNTER — Other Ambulatory Visit (HOSPITAL_COMMUNITY): Payer: Self-pay

## 2014-03-07 LAB — BLOOD GAS, ARTERIAL
Acid-Base Excess: 9 mmol/L — ABNORMAL HIGH (ref 0.0–2.0)
BICARBONATE: 35.3 meq/L — AB (ref 20.0–24.0)
FIO2: 0.4 %
O2 Saturation: 97.3 %
PATIENT TEMPERATURE: 98.1
TCO2: 37.5 mmol/L (ref 0–100)
pCO2 arterial: 72.2 mmHg (ref 35.0–45.0)
pH, Arterial: 7.309 — ABNORMAL LOW (ref 7.350–7.450)
pO2, Arterial: 103 mmHg — ABNORMAL HIGH (ref 80.0–100.0)

## 2014-03-08 LAB — BLOOD GAS, ARTERIAL
Acid-Base Excess: 9 mmol/L — ABNORMAL HIGH (ref 0.0–2.0)
Bicarbonate: 35.6 mEq/L — ABNORMAL HIGH (ref 20.0–24.0)
FIO2: 0.28 %
O2 CONTENT: 5 L/min
O2 Saturation: 89.2 %
Patient temperature: 98.6
TCO2: 38 mmol/L (ref 0–100)
pCO2 arterial: 77.1 mmHg (ref 35.0–45.0)
pH, Arterial: 7.286 — ABNORMAL LOW (ref 7.350–7.450)
pO2, Arterial: 63.9 mmHg — ABNORMAL LOW (ref 80.0–100.0)

## 2014-03-09 LAB — BLOOD GAS, ARTERIAL
ACID-BASE EXCESS: 9.2 mmol/L — AB (ref 0.0–2.0)
Bicarbonate: 35.1 mEq/L — ABNORMAL HIGH (ref 20.0–24.0)
FIO2: 0.35 %
O2 SAT: 86.8 %
PATIENT TEMPERATURE: 98.6
PCO2 ART: 67.4 mmHg — AB (ref 35.0–45.0)
PO2 ART: 58.5 mmHg — AB (ref 80.0–100.0)
TCO2: 37.2 mmol/L (ref 0–100)
pH, Arterial: 7.337 — ABNORMAL LOW (ref 7.350–7.450)

## 2014-03-11 ENCOUNTER — Other Ambulatory Visit (HOSPITAL_COMMUNITY): Payer: Self-pay

## 2014-03-11 LAB — CBC
HCT: 30.1 % — ABNORMAL LOW (ref 36.0–46.0)
Hemoglobin: 9.1 g/dL — ABNORMAL LOW (ref 12.0–15.0)
MCH: 27.7 pg (ref 26.0–34.0)
MCHC: 30.2 g/dL (ref 30.0–36.0)
MCV: 91.8 fL (ref 78.0–100.0)
PLATELETS: 121 10*3/uL — AB (ref 150–400)
RBC: 3.28 MIL/uL — AB (ref 3.87–5.11)
RDW: 17.4 % — AB (ref 11.5–15.5)
WBC: 5.1 10*3/uL (ref 4.0–10.5)

## 2014-03-11 LAB — URINALYSIS, ROUTINE W REFLEX MICROSCOPIC
Bilirubin Urine: NEGATIVE
Glucose, UA: NEGATIVE mg/dL
KETONES UR: NEGATIVE mg/dL
Nitrite: NEGATIVE
PH: 8.5 — AB (ref 5.0–8.0)
Protein, ur: NEGATIVE mg/dL
Specific Gravity, Urine: 1.017 (ref 1.005–1.030)
Urobilinogen, UA: 0.2 mg/dL (ref 0.0–1.0)

## 2014-03-11 LAB — BASIC METABOLIC PANEL
Anion gap: 6 (ref 5–15)
BUN: 12 mg/dL (ref 6–23)
CO2: 34 mmol/L — ABNORMAL HIGH (ref 19–32)
CREATININE: 0.84 mg/dL (ref 0.50–1.10)
Calcium: 8.4 mg/dL (ref 8.4–10.5)
Chloride: 99 mmol/L (ref 96–112)
GFR, EST AFRICAN AMERICAN: 88 mL/min — AB (ref >90)
GFR, EST NON AFRICAN AMERICAN: 76 mL/min — AB (ref >90)
Glucose, Bld: 92 mg/dL (ref 70–99)
POTASSIUM: 3.8 mmol/L (ref 3.5–5.1)
Sodium: 139 mmol/L (ref 135–145)

## 2014-03-11 LAB — BLOOD GAS, ARTERIAL
Acid-Base Excess: 11.5 mmol/L — ABNORMAL HIGH (ref 0.0–2.0)
Bicarbonate: 36.9 mEq/L — ABNORMAL HIGH (ref 20.0–24.0)
FIO2: 0.28 %
O2 Saturation: 95.6 %
PH ART: 7.392 (ref 7.350–7.450)
Patient temperature: 98.6
TCO2: 38.8 mmol/L (ref 0–100)
pCO2 arterial: 62 mmHg (ref 35.0–45.0)
pO2, Arterial: 73.6 mmHg — ABNORMAL LOW (ref 80.0–100.0)

## 2014-03-11 LAB — URINE MICROSCOPIC-ADD ON

## 2014-03-12 ENCOUNTER — Other Ambulatory Visit (HOSPITAL_COMMUNITY): Payer: Medicare Other

## 2014-03-14 LAB — URINE CULTURE: Colony Count: >=100000

## 2014-03-17 LAB — CULTURE, BLOOD (ROUTINE X 2)
CULTURE: NO GROWTH
CULTURE: NO GROWTH

## 2014-04-04 ENCOUNTER — Other Ambulatory Visit (HOSPITAL_COMMUNITY): Payer: Self-pay | Admitting: Family Medicine

## 2014-04-04 DIAGNOSIS — R633 Feeding difficulties, unspecified: Secondary | ICD-10-CM

## 2014-04-06 ENCOUNTER — Ambulatory Visit (HOSPITAL_COMMUNITY)
Admission: RE | Admit: 2014-04-06 | Discharge: 2014-04-06 | Disposition: A | Discharge status: Home / Self Care | Payer: Medicare (Managed Care) | Source: Ambulatory Visit | Attending: Interventional Radiology | Admitting: Interventional Radiology

## 2014-04-06 DIAGNOSIS — Z431 Encounter for attention to gastrostomy: Secondary | ICD-10-CM | POA: Insufficient documentation

## 2014-04-06 DIAGNOSIS — R633 Feeding difficulties, unspecified: Secondary | ICD-10-CM

## 2014-04-06 MED ORDER — LIDOCAINE VISCOUS 2 % MT SOLN
OROMUCOSAL | Status: AC
  Filled 2014-04-06: qty 15

## 2014-04-27 ENCOUNTER — Telehealth: Payer: Self-pay | Admitting: Internal Medicine

## 2014-04-27 NOTE — Telephone Encounter (Signed)
Spoke with the pt and notified needs to contact Dr Lucia Gaskins  Number given to the pt [phone] Nothing further needed per pt

## 2014-04-27 NOTE — Telephone Encounter (Signed)
LMTCB

## 2014-04-27 NOTE — Telephone Encounter (Signed)
Needs to see Dr Radene Journey with ENT- per [facility] records he placed the trach  ATC pt, NA and unable to leave a msg, [facility]

## 2014-05-04 ENCOUNTER — Encounter: Payer: Self-pay | Admitting: Gastroenterology

## 2014-05-21 NOTE — Consult Note (Signed)
Pt very obese, staff having problem weaning her off vent.  Abd obese with active bowel sounds.  Elevated temp etio unknown. recent cultures neg. No new GI suggestions.  Electronic Signatures: Manya Silvas (MD)  (Signed on 26-Dec-15 09:43)  Authored  Last Updated: 26-Dec-15 09:43 by Manya Silvas (MD)

## 2014-05-21 NOTE — Consult Note (Signed)
Pt with massive obesity on vent for resp failure, T max 100.1, hgb stable, no evidence of bleeding.  renal funct improved, creat down to 1.46.  On tube feeding.  No new recommendations at this time from GI standpoint.  Electronic Signatures: Manya Silvas (MD)  (Signed on 25-Dec-15 11:46)  Authored  Last Updated: 25-Dec-15 11:46 by Manya Silvas (MD)

## 2014-05-21 NOTE — Consult Note (Signed)
Brief Consult Note: Diagnosis: Anemia/Heme positive.   Patient was seen by consultant.   Comments: Theresa Leon is a 59 y/o caucasian female admitted with acute respiratory failure ?PNA, hyperkalemia, anemia & heme positive stool.  No reports of gross GI bleeding. Pt has received 1st unit of PRBCS.  Last Hgb 6.8.  Platelets 119.  Dr Verdell Carmine at bedside to address acute respiratory failure at present, pt obtunded.  Last EGD 2013 showed gastritis.   Will continue to follow with you, consider EGD & colonoscopy once stable from respiratory standpoint.  Plan: 1) NPO 2) Monitor for gross GI bleed 3) agree with protonix drip 4) EGD/colonoscopy once stable 5) Agree with transfusion to keep H/H stable above 7-8 grams 6) Hyperkalemia management per attending Thanks for allowing [place] to participate in her care.  Please see full dictated note. #[id number].  Electronic Signatures: Theresa Leon (NP)  (Signed 21-Dec-15 13:02)  Authored: Brief Consult Note   Last Updated: 21-Dec-15 13:02 by Theresa Leon (NP)

## 2014-05-21 NOTE — Op Note (Signed)
PATIENT NAME:  Theresa Leon, Theresa Leon MR#:  [id number] DATE OF BIRTH:  [birth year]  DATE OF PROCEDURE:  09/29/2013  DATE OF PROCEDURE: 09/29/2013.   PREOPERATIVE DIAGNOSES:  1.  Sepsis.  2.  Fournier's gangrene.  3.  Hypoxemia.  4.  Volume overload.  5.  Acute renal failure.  6.  Morbid obesity.   POSTOPERATIVE DIAGNOSES:   1.  Sepsis.  2.  Fournier's gangrene.  3.  Hypoxemia.  4.  Volume overload.  5.  Acute renal failure.  6.  Morbid obesity.   PROCEDURE PERFORMED: Placement of temporary dual lumen dialysis catheter, right internal jugular through same venous access with fluoroscopic guidance.   PROCEDURE PERFORMED BY:  Katha Cabal, MD    DESCRIPTION OF PROCEDURE: The patient is already in special procedures having undergone securing triple-lumen access on the left through the jugular approach.  Sterile drapes were all taken down.  Her parenteral medications are switched over to the left triple-lumen and the right neck and existing central line is prepped and draped in a sterile fashion. The catheter sutures are then cut and the catheter is pulled back about 4 cm.  It is then transected and a J-wire is advanced through the existing distal portion of the catheter. Dilators are passed over the wire and a double-lumen 20 cm temporary dialysis catheter is advanced under fluoroscopic guidance so that the tip is positioned at the atriocaval junction.  Both lumens aspirate easily and flush well, and the catheter is secured to the skin of the neck with a 2-0 nylon sterile dressing where a Biopatch is applied.  The patient tolerated the procedure well and there were no immediate complications.     ____________________________ Katha Cabal, MD ggs:DT D: 10/01/2013 12:47:59 ET T: 10/01/2013 13:31:49 ET JOB#: [id number]  cc: Katha Cabal, MD, <Dictator> Katha Cabal MD ELECTRONICALLY SIGNED 10/05/2013 22:41

## 2014-05-21 NOTE — Op Note (Signed)
PATIENT NAME:  Theresa Leon, CADE MR#:  [id number] DATE OF BIRTH:  03-08-1955  DATE OF PROCEDURE:  09/29/2013   PREOPERATIVE DIAGNOSIS: Necrotizing soft tissue infection, right perineum.   POSTOPERATIVE DIAGNOSIS: Necrotizing soft tissue infection, right perineum.   PROCEDURE PERFORMED: Examination under anesthesia, radical debridement of right perineal necrotizing soft tissue infections.   SURGEON: Tequlia Gonsalves A. Marina Gravel, MD FACS  ASSISTANT: Scrub tech.  ANESTHESIA: General endotracheal.   FINDINGS: Necrotic fat extending some distance 8-10 cm into the right groin.   SPECIMENS: Pus and necrotic fat to pathology.   DRAINS: None.   LAP AND NEEDLE COUNT: Correct x 2.   DESCRIPTION OF PROCEDURE:    With informed consent, supine position, general endotracheal anesthesia, the patient was positioned and padded in dorsal lithotomy. The perineum was sterilely prepped and draped with Betadine solution. Timeout was observed.    Digital rectal examination demonstrated no evidence of a perianal abscess. Along the right perineal area, there was a punctate area of disruption in the skin. Probing of this with a small kelly clamp demonstrated some foul smelling fluid. This was then opened with electrocautery on pure cut, demonstrating a large cavity of necrotic fat. There was some dishwater-like pus. This was all debrided sharply with scissors and electrocautery, with hemostasis being obtained with figure-of-eight #0 Vicryl sutures to small vessels. Tissue was debrided back to healthy-appearing fat, hemostasis again being obtained with point cautery. The wound was then irrigated with normal saline. It was packed widely open utilizing Kerlix gauze. Wound was 8-10 cm in length.    The patient was then subsequently returned supine, extubated and taken to the recovery room in stable and satisfactory condition by anesthesia services.   ____________________________ Jeannette How Marina Gravel, MD FACS mab:MT D: 09/29/2013 07:09:39  ET T: 09/29/2013 11:59:03 ET JOB#: [id number]  cc: Elta Guadeloupe A. Marina Gravel, MD, <Dictator> Dhanvi Boesen A Sherrell Farish MD ELECTRONICALLY SIGNED 09/29/2013 21:03

## 2014-05-21 NOTE — Consult Note (Signed)
Chief Complaint:  Subjective/Chief Complaint Pt nonverbal on vent.  RN notes no gross bleeding.   VITAL SIGNS/ANCILLARY NOTES: **Vital Signs.:   22-Dec-15 07:27  Temperature Temperature (F) 98.5  Celsius 36.9  Pulse Pulse 73  Respirations Respirations 15  Systolic BP Systolic BP 96  Diastolic BP (mmHg) Diastolic BP (mmHg) 66  Mean BP 76  Pulse Ox % Pulse Ox % 100  Pulse Ox Activity Level  At rest  Oxygen Delivery Ventilator Assisted   Brief Assessment:  GEN obese, critically ill appearing, +sedated, intubated   Cardiac Regular   Respiratory on vent   Gastrointestinal details normal Soft  Nontender  No masses palpable  Bowel sounds normal   EXTR positive edema   Additional Physical Exam Skin: pink, warm, dry   Lab Results:  Routine Chem:  22-Dec-15 03:58   BUN  68  Creatinine (comp)  5.03  Sodium, Serum 141  Potassium, Serum 4.9  Chloride, Serum  108  CO2, Serum 26  Calcium (Total), Serum  8.1  Anion Gap 7  Osmolality (calc) 302  eGFR (African American)  11  eGFR (Non-African American)  9 (eGFR values <60m/min/1.73 m2 may be an indication of chronic kidney disease (CKD). Calculated eGFR, using the MRDR Study equation, is useful in  patients with stable renal function. The eGFR calculation will not be reliable in acutely ill patients when serum creatinine is changing rapidly. It is not useful in patients on dialysis. The eGFR calculation may not be applicable to patients at the low and high extremes of body sizes, pregnant women, and vegetarians.)  Magnesium, Serum  1.5 (1.8-2.4 THERAPEUTIC RANGE: 4-7 mg/dL TOXIC: > 10 mg/dL  -----------------------)  Result Comment LABS - This specimen was collected through an   - indwelling catheter or arterial line.  - A minimum of 531m of blood was wasted prior    - to collecting the sample.  Interpret  - results with caution.  Result(s) reported on 18 Jan 2014 at 04:33AM.  Phosphorus, Serum  5.9 (Result(s) reported  on 18 Jan 2014 at 04:41AM.)    09:02   Glucose, Serum  149  BUN  61  Creatinine (comp)  4.65  Sodium, Serum 139  Potassium, Serum 5.0  Chloride, Serum 106  CO2, Serum 28  Calcium (Total), Serum  8.1  Anion Gap  5  Osmolality (calc) 298  eGFR (African American)  13  eGFR (Non-African American)  10 (eGFR values <6070min/1.73 m2 may be an indication of chronic kidney disease (CKD). Calculated eGFR, using the MRDR Study equation, is useful in  patients with stable renal function. The eGFR calculation will not be reliable in acutely ill patients when serum creatinine is changing rapidly. It is not useful in patients on dialysis. The eGFR calculation may not be applicable to patients at the low and high extremes of body sizes, pregnant women, and vegetarians.)  Routine Hem:  22-Dec-15 03:58   WBC (CBC) 6.3  RBC (CBC)  2.70  Hemoglobin (CBC)  6.7  Hematocrit (CBC)  22.3  Platelet Count (CBC)  91  MCV 83  MCH  24.9  MCHC  30.1  RDW  17.4  Neutrophil % 61.8  Lymphocyte % 21.0  Monocyte % 10.7  Eosinophil % 5.9  Basophil % 0.6  Neutrophil # 3.9  Lymphocyte # 1.3  Monocyte # 0.7  Eosinophil # 0.4  Basophil # 0.0   Assessment/Plan:  Assessment/Plan:  Assessment Anemia/Heme positive stool:  No grooss GI bleeding.  Pt has received a  unit PRBCs, another planned per attending.  Consider EGD and colonoscopy once she is stable from a respiratory standpoint.  I have discussed her care with Dr Evangeline Gula [facility] & our plan of care is below.  Discussed with Dr Verdell Carmine.   Plan 1) continue to monitor for gross GI bleeding 2) Continue pepcid 3) EGD and colonoscopy once she is stable from respiratory standpoint 4) Agree with transfusion to keep H and H stable above 7-8 grams 5) Hyperkalemia management per attending/nephrology Please call if you have any questions or concerns   Electronic Signatures: Andria Meuse (NP)  (Signed 22-Dec-15 09:42)  Authored: Chief Complaint, VITAL  SIGNS/ANCILLARY NOTES, Brief Assessment, Lab Results, Assessment/Plan   Last Updated: 22-Dec-15 09:42 by Andria Meuse (NP)

## 2014-05-21 NOTE — Consult Note (Signed)
Pt on vent, not responsive, very obese.  Had iron def anemia on admit. No GI plans at this time.  Will follow .  Electronic Signatures: Manya Silvas (MD)  (Signed on 24-Dec-15 14:54)  Authored  Last Updated: 24-Dec-15 14:54 by Manya Silvas (MD)

## 2014-05-21 NOTE — Discharge Summary (Signed)
PATIENT NAME:  Theresa Leon, Theresa Leon MR#:  [id number] DATE OF BIRTH:  [birth year]  DATE OF ADMISSION:  09/29/2013 DATE OF DISCHARGE:  10/05/2013  Please refer to interim discharge summary dictated on 10/04/2013 to Dr. Verdell Carmine.  ADMITTING DIAGNOSIS: Sepsis.   DISCHARGE DIAGNOSES:  1.  Severe sepsis, septic shock.  2.  Acute on chronic respiratory failure.  3.  Acute renal failure due to acute tubular necrosis, status post CRT on 09/29/2013, resolved. 4.  Right perineal necrotizing soft tissue infection, status post an incision and drainage and surgical debridement on 10/06/2013 by Dr. Leanora Cover.  5.  Thrombocytopenia due to infection, resolving.  6.  History of morbid obesity.  7.  Obstructive sleep apnea, on CPAP at home. 8.  Diabetes mellitus. 9.  Diabetic neuropathy. 10.  Hypothyroidism.  11.  Anemia.  12.  Status post packed red blood cell transfusion during this admission.   PROCEDURES:  2.  Right IJ double-lumen dialysis catheter placement on 09/29/2013 by Dr. Delana Meyer under fluoroscopy.  2.  Left IJ triple-lumen central venous catheter placement by Dr. Delana Meyer under fluoroscopy on 09/29/2013.   DISCHARGE INSTRUCTIONS: The patient was advised to drink plenty of fluids to make sure that she is not getting dehydrated, as she was complaining of some mild diarrhea with Augmentin and if diarrhea continuous, the patient was advised to hold metformin and follow up with primary care physician.   DISCHARGE MEDICATIONS: The patient's medication list is as follows:  1.  Ranitidine 150 mg p.o. twice daily. 2.  Singulair 10 mg p.o. daily.  3.  Synthroid 100 mcg p.o. daily. 4.  Gabapentin 600 mg p.o. 3 times daily. 5.  Trazodone 50 mg at bedtime. 6.  Metformin extended release 500 mg p.o. daily. 7.  Acetaminophen/hydrocodone 325/5 mg 1 tablet every 6 hours as needed.  8.  Meclizine 25 mg every 6 hours as needed.  9.  Citalopram 10 mg p.o. daily.  10.  Xyzal 5 mg p.o. at bedtime.  11.  Glipizide XL  2.5 mg p.o. daily.  12.  Loperamide 2 mg 4 times daily as needed.  13.  Amoxicillin clavulanate 500 mg p.o. twice daily for 20 more days, to complete 3 week course.   HOME HEALTH: Physical therapy as well as nurse dressing care. The patient was advised to keep dressing dry, cleanse left labia with normal saline solution and pat gently. Gently fill wound with Aquacel AG strips for absorption, top with 4 x 4 gauze then ABD pad and secure with tape. Change daily. If the patient soils left perineal wound with liquid stool, remove only one single layer of Kerlix sponge by cutting it off.   HOME OXYGEN: None.   DIET: Two gram salt, low-fat, low-cholesterol, carbohydrate -controlled diet, regular consistency.   ACTIVITY LIMITATIONS: As tolerated.   REFERRAL: To home health physical therapy as well as Therapist, sports.   FOLLOWUP APPOINTMENT: With Dr. Quay Burow in 2 days after discharge; Dr. Pat Patrick, Dr. Leanora Cover or Dr. Burt Knack in 1 week after discharge.   CONSULTANTS: Care management, social work, Dr. Burt Knack, Dr. Leanora Cover, Dr. Jeannie Fend, Dr. Leotis Pain, Dr. Flora Lipps, Dr. Marina Gravel, Dr. Delana Meyer.  RADIOLOGIC STUDIES: Chest x-ray, portable single view, 09/29/2013, revealed central line to low SVC without pneumothorax. CT scan of pelvis without contrast 09/29/2013 showed extensive locules of air within the right perineum and labia concerning for necrotizing perineal fasciitis.  HOSPITAL COURSE: The patient is a 59 year old Caucasian female who presented to the hospital on 09/29/2013 with sepsis. Please refer  to Dr. Samantha Crimes admission note on 09/29/2013. She was weak. She was afebrile, however, was hypotensive with systolic blood pressure in 70s. Physical examination revealed a raised labial lesion abscess and surrounding erythema. The patient was admitted to the hospital for further evaluation due to severe sepsis with shock and renal failure and lactic acidosis, placed on pressors. She was pancultured and CT scan of pelvis was  performed due to concerns of possible gas formation in soft tissue. She was initiated on vancomycin, Zosyn as well as clindamycin, and surgical consultation was obtained. Surgery saw the patient in consultation on 09/29/2013. She underwent incision and drainage and Dr. Marina Gravel recommended exploration in the Operating Room and drainage of soft tissue infection. The patient underwent operation on 10/01/2013, debridement of necrotizing soft tissue infection of right perineum subcutaneous tissue. Post procedure, she did relatively well in the hospital. Her acute on chronic respiratory failure resolved. Also, acute renal failure which is felt to be due to ATN, although it required CRT on 09/29/2013 which helped. Post CRT, the patient's urine output improved as well as well as her overall condition. The patient was managed on IV antibiotics through 10/04/2013, then changed to oral antibiotic Augmentin. The patient's wound culture done on 09/29/2013 showed gram-positive rod, no further ID was performed and coagulase-negative Staphylococcus as well as mixed anaerobic flora, 3 or more were present. The patient was recommended to continue antibiotic therapy for a total of 3 weeks 20 days after discharge to complete course. She is to followup with surgery as an outpatient as well as her primary care physician, Dr. Quay Burow. She is to have home health services at home to change dressings daily. On the day of discharge, the patient felt satisfactory, did not complain of any significant discomfort or pain. She was able to ambulate and she was able to void after Foley catheter was removed. Her vital signs: Temperature was 98.4, pulse was 89, respiratory rate was 18, blood pressure 119/62, saturation 91% on room air at rest.   TIME SPENT: Forty minutes on this patient.    ____________________________ Theodoro Grist, MD rv:TT D: 10/05/2013 14:14:24 ET T: 10/05/2013 16:12:52 ET JOB#: [id number]  cc: Theodoro Grist, MD,  <Dictator> Harriet BurnMD Torey Reinard MD ELECTRONICALLY SIGNED 10/17/2013 18:30

## 2014-05-21 NOTE — Consult Note (Signed)
PATIENT NAME:  Theresa Leon, Theresa Leon MR#:  [id number] DATE OF BIRTH:  1955-11-23  DATE OF CONSULTATION:  04/28/2013  REFERRING PHYSICIAN:   CONSULTING PHYSICIAN:  Dionisio David, MD  INDICATION FOR CONSULTATION: Chest pain.   HISTORY OF PRESENT ILLNESS: This is a 59 year old white female with a past medical history of morbid obesity, obstructive sleep apnea, history of bovine valve replacement, atrial fibrillation and permanent pacemaker. who presented to the Emergency Room with chest pain. The chest pain was described as pressure-type associated with shortness of breath. She denies any further chest pain. MI has been ruled out.   PAST MEDICAL HISTORY: History of asthma, sleep apnea, pseudogout, migraine, aortic stenosis, GI reflux, hypothyroidism, valvular heart disease, atrial fibrillation, morbid obesity, frequent syncopal episodes, pacemaker insertion in 2009 and valve replacement was with bovine valve in 2008 and at that time had normal coronaries.   SOCIAL HISTORY: Unremarkable.   FAMILY HISTORY: Unremarkable.   PHYSICAL EXAMINATION:  GENERAL: She is alert, oriented x 3, in no acute distress.  VITAL SIGNS: Stable.  NECK: No JVD.  LUNGS: Clear.  HEART: Regular rate and rhythm. Normal S1, S2. No audible murmur.  ABDOMEN: Soft, nontender, positive bowel sounds.  EXTREMITIES: No pedal edema.  NEUROLOGIC: Appears to be intact.  Cardiac enzymes are negative. EKG shows AV sequential pacemaker, 62 beats, no acute changes.   ASSESSMENT AND PLAN: Atypical chest pain, most likely musculoskeletal. She had normal coronaries on catheterization in 2013 and also in 2008. I advised transferring to telemetry and discharge in the morning with follow up stress test as an outpatient in the office.   Thank you very much for the referral.  ____________________________ Dionisio David, MD sak:aw D: 04/28/2013 11:24:53 ET T: 04/28/2013 11:35:06 ET JOB#: [id number]  cc: Dionisio David, MD,  <Dictator> Dionisio David MD ELECTRONICALLY SIGNED 06/04/2013 11:02

## 2014-05-21 NOTE — Discharge Summary (Signed)
PATIENT NAME:  Theresa Leon, Theresa Leon MR#:  [id number] DATE OF BIRTH:  July 20, 1955  DATE OF ADMISSION:  04/28/2013 DATE OF DISCHARGE:  04/29/2013  REASON FOR ADMISSION:   Chest pain.  DISCHARGE DIAGNOSES:  1. Chest pain of musculoskeletal origin/atypical. 2. Vascular volume depletion with hypovolemic shock. 3. Obstructive sleep apnea. 4. Morbid obesity. 5. Valvular heart disease.  6. Status post valve replacement. 7. Atrial fibrillation.  8. Permanent pacemaker.  9. Syncopal episode.  10. Gastroesophageal reflux. 11. Aortic stenosis.  12. Status post aortic valve replacement with bovine valves. 13. Acute kidney injury, now resolved.   DISPOSITION: Home.   HOSPITAL COURSE: This is a very nice 59 year old female with history of the problems mentioned above. The patient came to the Emergency Department on 04/28/2013. She was evaluated by Emergency Department physician who determined that she needed to be admitted due to risk factors of her chest pain and hypotension with acute kidney failure. The patient had some chest discomfort that started the evening of the 1st, left side recurrent through the day radiating to the right side of his chest associated with mild nausea and shortness of breath. No palpitations or diaphoresis. She has history of repeated syncopal episodes that have been studied by her cardiologist. She has 2 mild episodes the day of the admission. The patient had a cardiac catheterization in December of 2013 without any significant coronary artery disease. The patient was noticed to be significantly hypotensive with systolic blood pressure dropping into the 70s. The patient was in acute kidney failure with a creatinine of 3.22 where her previous creatinine was 1.4.   The patient required 2 boluses of isotonic saline solution without any major improvement, for which she was admitted.  She was placed on dopamine and sent to the ICU. In the ICU, the patient was evaluated by Dr. Humphrey Rolls, who  determined that the pain with her chest was mostly musculoskeletal.  An echocardiogram was done to evaluate the aortic valve and for any other cardiac problems, including akinesis or hypokinesis of the ventricle.   Her ejection fraction was 55-60%. She has mildly elevated left atrial pressures and impaired relaxation with left ventricular diastolic filling defect/diastolic dysfunction.  Her bioprosthesis was working well and there was increase on posterior wall thickness severely on the ventricle. The patient received IV fluids through the night, continued on dopamine. The 2nd day, she was feeling already much better.  Dr. Humphrey Rolls evaluated her and determined that she would be able to be discharged.   As far as her medical problems go, the chest pain began, cardiac enzymes were negative, and the pain was diagnosed as musculoskeletal. The patient has outpatient followup with Dr. Humphrey Rolls for a possible stress test in the future, acute kidney failure resolved with infusion of IV fluids. The patient was volume depleted and the reason was that she was started on   Lasix and continued to take hydrochlorothiazide.  The patient was significantly hypotensive.  Due to AKI Lasix was held and IV fluids were given.  Patient improved, required dopamine to improve her blood pressures, and after almost 2 liters of isotonic solution with no result for which this was definitely hypovolemic shock.  The patient had an echocardiogram that showed diastolic dysfunction, ejection fraction of 55-60%, good aortic valve bioprosthesis. No cardiac explanation for this hypotension and shock.   Her diabetes was well controlled during her failure.  Her metformin was held and told her to start in a couple of days after discharge.   The  patient has atrial fibrillation, which was a normal sinus rhythm on telemetry whenever she was admitted for which this is paroxysmal.    For obstructive sleep apnea, patient is compliant with a BiPAP.  As far as  her syncopal episodes, it is likely related to her epistaxis. The patient has been seen by Dr. Humphrey Rolls for this problem and she will continue to follow up with him.    As far as her other problems, the patient had what seems to be a UTI, had a low grade fever, and was treated with Rocephin and discharged home with cefuroxime. Her urinalysis was done, but no culture was sent.  When I saw the patient, she was already started on antibiotics for what we did not continue.  We will reorder a culture.   The patient is discharged in good condition. Followup with Dr. Neoma Laming and  Dr. Ellin Goodie. I spent about 45 minutes discharging this patient the day of discharge.    ____________________________ Hyder Sink, MD rsg:dd D: 05/02/2013 21:34:44 ET T: 05/02/2013 22:51:31 ET JOB#: [id number]  cc: Calion Sink, MD, <Dictator> [place] MD ELECTRONICALLY SIGNED 05/04/2013 21:19

## 2014-05-21 NOTE — Consult Note (Signed)
CHIEF COMPLAINT and HISTORY:  Subjective/Chief Complaint renal failure   History of Present Illness patient with morbid obesity and multiple other medical issues.  Admitted with MSOF.  Need catheter for dialysis   PAST MEDICAL/SURGICAL HISTORY:  Past Medical History:   "Cardiac Event":    Sleep Apnea:    Osteoarthritis:    Diabetes:    GERD:    Depression:    Arthritis:    Hypothyroidism:    Valve Replacement:    Tonsillectomy and Adenoidectomy:    Cholecystectomy:    Aortic Repair: 2008   Pacemaker: 2008  ALLERGIES:  Allergies:  Tetanus Toxoid: Angioedema  Nitrates: Other  Tape: Other  Animal dander-Cat: Other  Dust: Other  LOpressor: Unknown  Nitro-Dur: Unknown  HOME MEDICATIONS:  Home Medications: Medication Instructions Status  loperamide 2 mg oral capsule 1 cap(s) orally 4 times a day, As Needed, diarrhea , As needed, diarrhea Active  amoxicillin-clavulanate 500 milligram(s) orally 2 times a day Active  GlipiZIDE XL 2.5 mg oral tablet, extended release 1 tab(s) orally once a day Active  acetaminophen-HYDROcodone 325 mg-5 mg oral tablet 1 tab(s) orally every 6 hours, As Needed Active  meclizine 25 mg oral tablet 1 tab(s) orally every 6 hours, As Needed Active  citalopram 10 mg oral tablet 1 tab(s) orally once a day Active  Xyzal 5 mg oral tablet 1 tab(s) orally once a day (in the evening) Active  ranitidine 150 mg oral tablet 1 tab(s) orally 2 times a day Active  Singulair 10 mg oral tablet 1 tab(s) orally once a day (in the evening) Active  Synthroid 100 mcg (0.1 mg) oral tablet 1 tab(s) orally once a day Active  gabapentin 600 mg oral tablet 1 tab(s) orally 3 times a day Active  traZODone 50 mg oral tablet 1 tab(s) orally once a day (at bedtime) Active  metFORMIN extended release 500 mg oral tablet, extended release 1 tab(s) orally once a day. hold until cleared by pcp or cardiologist , if creatinine < 1.4 Active   Family and Social History:   Family History Non-Contributory   Place of Living Home   Review of Systems:  ROS Pt not able to provide ROS   Medications/Allergies Reviewed Medications/Allergies reviewed   Physical Exam:  GEN obese, critically ill appearing   HEENT pink conjunctivae, moist oral mucosa   NECK No masses  trachea midline   RESP rhonchi  patient on vent   CARD irregular rate  no JVD   VASCULAR ACCESS none   ABD denies tenderness  soft   GU foley catheter in place  clear yellow urine draining   LYMPH negative neck, negative axillae   EXTR negative cyanosis/clubbing, positive edema   SKIN normal to palpation, skin turgor good   NEURO sedated on vent   PSYCH sedated   LABS:  Laboratory Results: Thyroid:    20-Dec-15 19:00, Thyroid Stimulating Hormone  Thyroid Stimulating Hormone 2.27  0.45-4.50  (IU = International Unit)   -----------------------  Pregnant patients have   different reference   ranges for TSH:   - - - - - - - - - -   Pregnant, first trimetser:   0.36 - 2.50 uIU/mL  LabObservation:    21-Dec-15 10:45, Echo Doppler  OBSERVATION   Reason for Test  Hepatic:    21-Dec-15 09:12, Comprehensive Metabolic Panel  Bilirubin, Total 1.1  Alkaline Phosphatase 76  46-116  NOTE: New Reference Range  08/17/13  SGPT (ALT) 16  14-63  NOTE: New Reference  Range  08/17/13  SGOT (AST) 20  Total Protein, Serum 7.5  Albumin, Serum 3.2  Routine BB:    20-Dec-15 19:37, Crossmatch 1 Unit  Crossmatch Unit 1   Transfused   Result(s) reported on 17 Jan 2014 at 05:54AM.    20-Dec-15 19:37, Type and Antibody Screen  ABO Group + Rh Type   A Positive  Antibody Screen NEGATIVE  Result(s) reported on 16 Jan 2014 at 08:20PM.  Routine Micro:    20-Dec-15 21:03, Urine Culture  Micro Text Report   URINE CULTURE    COMMENT                   NO GROWTH IN 8-12 HOURS     ANTIBIOTIC  Specimen Source   IN AND OUT CATH  Culture Comment   NO GROWTH IN 8-12 HOURS   Result(s)  reported on 17 Jan 2014 at 10:29AM.    20-Dec-15 21:43, Blood Culture  Micro Text Report   BLOOD CULTURE    COMMENT                   NO GROWTH IN 8-12 HOURS     ANTIBIOTIC  Culture Comment   NO GROWTH IN 8-12 HOURS   Result(s) reported on 17 Jan 2014 at 06:00AM.    20-Dec-15 21:44, Blood Culture  Micro Text Report   BLOOD CULTURE    COMMENT                   NO GROWTH IN 8-12 HOURS     ANTIBIOTIC  Culture Comment   NO GROWTH IN 8-12 HOURS   Result(s) reported on 17 Jan 2014 at 06:00AM.  Lab:    21-Dec-15 09:10, ABG  pH (ABG) 6.930  7.350-7.450  NOTE: New Reference Range  08/21/13  PCO2 112  32-48  NOTE: New Reference Range  09/07/13  PO2 68  83-108  NOTE: New Reference Range  08/21/13  FiO2 28  Base Excess -11  -3-3  NOTE: New Reference Range  09/07/13  HCO3 23.5  21.0-28.0  NOTE: New Reference Range  08/21/13  O2 Saturation 91.6  O2 Device [place]  Specimen Site (ABG)   RT RADIAL  Specimen Type (ABG) ARTERIAL  Patient Temp (ABG) 37.0  Cardiology:    21-Dec-15 10:45, Echo Doppler  Echo Doppler   REASON FOR EXAM:      COMMENTS:       PROCEDURE: [place] - ECHO DOPPLER COMPLETE(TRANSTHOR)  - Jan 17 2014 10:45AM     RESULT: Echocardiogram Report    Patient Name:   Theresa Leon Date of Exam: 01/17/2014  Medical Rec #:  [id number]        Custom1:  Date of Birth:  20-Feb-1955    Height:       67.0 in  Patient Age:    59 years      Weight:       361.0 lb  Patient Gender: F             BSA:          2.60 m??    Indications: CHF  Sonographer:    Sherrie Sport RDCS  Referring Phys: Hillary Bow, R    Sonographer Comments: Echo performed with patient supine and on   artificial respirator, Technically very difficult study due to very poor   echo windows and The only view obtainable was a modified parasterna.    Summary:   1. Left ventricular ejection fraction,  by visual estimation, is 60 to   65%.   2. Normal global left ventricular systolic function.   3. Normal right  ventricular size and systolic function.   4. Mild mitral valve regurgitation.   5. Mild tricuspid regurgitation.   6. Mildly elevated pulmonary artery systolic pressure.  2D AND M-MODE MEASUREMENTS (normal ranges within parentheses):  Left Ventricle:          Normal  IVSd (2D):      1.11 cm (0.7-1.1)  LVPWd (2D):     1.41 cm (0.7-1.1) Aorta/LA:                  Normal  LVIDd (2D): 5.07 cm (3.4-5.7) Aortic Root (2D): 2.50 cm (2.4-3.7)  LVIDs (2D):     3.33 cm           Left Atrium (2D): 3.50 cm (1.9-4.0)  LV FS (2D):     34.3 %   (>25%)  LV EF (2D):     63.1 %   (>50%)                                    Right Ventricle:                              RVd (2D):        3.47 cm  SPECTRAL DOPPLER ANALYSIS (where applicable):  LVOT Vmax:  LVOT VTI:  LVOT Diameter: 2.10 cm  Tricuspid Valve and PA/RV Systolic Pressure: TR Max Velocity: 2.84 m/s RA   Pressure: 10 mmHg RVSP/PASP: 42.2 mmHg  Pulmonic Valve:  PV Max Velocity: 1.01 m/s PV Max PG: 4.1 mmHg PV Mean PG:  PHYSICIAN INTERPRETATION:  Left Ventricle: The left ventricular internal cavity size was normal. LV   posterior wall thickness was normal. No left ventricular hypertrophy.   Global LV systolic function was normal. Left ventricular ejection   fraction, by visual estimation, is 60 to 65%. Spectral Doppler shows   normal pattern of LV diastolic filling.  Right Ventricle: Normal right ventricular size, wall thickness, and   systolic function. The right ventricular size is normal. Global RV   systolic function is normal.  Left Atrium: The left atrium is normal in size.  Right Atrium: The right atrium is normal in size.  Pericardium: There is no evidence of pericardial effusion.  Mitral Valve: The mitral valve is normal in structure. Mild mitral valve   regurgitation is seen.  Tricuspid Valve: The tricuspid valve is normal. Mild tricuspid     regurgitation is visualized. The tricuspid regurgitant velocity is 2.84   m/s, and with an  assumed right atrial pressure of 10 mmHg, the estimated   right ventricular systolic pressure is mildly elevated at 42.2 mmHg.  Aortic Valve: The aortic valve is normal. The aortic valve is   structurally normal, with no evidence of sclerosis or stenosis. No   evidence of aortic valve regurgitation is seen.  Aorta: The aortic root and ascending aorta are structurally normal, with   no evidence of dilitation. The ascending aorta was not well visualized.   The aortic arch was not well visualized.    [id number] Ida Rogue MD  Electronically signed by [id number] Ida Rogue MD  Signature Date/Time: 01/17/2014/1:31:18 PM    *** Final ***  IMPRESSION: .        Verified By: Minna Merritts,  M.D., MD  Routine Chem:    20-Dec-15 89:37, Basic Metabolic Panel (w/Total Calcium)  Glucose, Serum 116  BUN 66  Creatinine (comp) 4.77  Sodium, Serum 140  Potassium, Serum 6.6  Chloride, Serum 112  CO2, Serum 23  Calcium (Total), Serum 8.4  Anion Gap 5  Osmolality (calc) 299  eGFR (African American) 12  eGFR (Non-African American) 10  eGFR values <75m/min/1.73 m2 may be an indication of chronic  kidney disease (CKD).  Calculated eGFR, using the MRDR Study equation, is useful in   patients with stable renal function.  The eGFR calculation will not be reliable in acutely ill patients  when serum creatinine is changing rapidly. It is not useful in  patients on dialysis. The eGFR calculation may not be applicable  to patients at the low and high extremes of body sizes, pregnant  women, and vegetarians.  Result Comment   POTASSIUM - RESULTS VERIFIED BY REPEAT TESTING.   - NOTIFIED OF CRITICAL VALUE   - C/ANDREA BRYANT AT 1944 01/16/14.PMH   - READ-BACK PROCESS PERFORMED.   Result(s) reported on 16 Jan 2014 at 07:45PM.    20-Dec-15 19:00, B-Type Natriuretic Peptide ([facility]  B-Type Natriuretic Peptide ([facility] [id number]  Result(s) reported on 16 Jan 2014 at 07:45PM.    20-Dec-15 19:00, Ferritin  ([facility]  Ferritin ([facility] 6  Result(s) reported on 16 Jan 2014 at 11:11PM.    20-Dec-15 19:00, Iron and IBC ([facility])  Iron Binding Capacity (TIBC) 478  Unbound Iron Binding Capacity 452  Iron, Serum 26  Iron Saturation 5  Result(s) reported on 16 Jan 2014 at 10:58PM.    21-Dec-15 034:28 Basic Metabolic Panel (w/Total Calcium)  Glucose, Serum 103  BUN 71  Creatinine (comp) 4.87  Sodium, Serum 142  Potassium, Serum 6.6  Chloride, Serum 113  CO2, Serum 24  Calcium (Total), Serum 8.1  Anion Gap 5  Osmolality (calc) 304  eGFR (African American) 12  eGFR (Non-African American) 10  eGFR values <646mmin/1.73 m2 may be an indication of chronic  kidney disease (CKD).  Calculated eGFR, using the MRDR Study equation, is useful in   patients with stable renal function.  The eGFR calculation will not be reliable in acutely ill patients  when serum creatinine is changing rapidly. It is not useful in  patients on dialysis. The eGFR calculation may not be applicable  to patients at the low and high extremes of body sizes, pregnant  women, and vegetarians.  Result Comment   POTASSIUM - RESULTS VERIFIED BY REPEAT TESTING.   - NOTIFIED OF CRITICAL VALUE   - [facility]   - READ-BACK PROCESS PERFORMED.   Result(s) reported on 17 Jan 2014 at 03:00AM.    21-Dec-15 09:10, ABG  Result Comment   ph - panic pH reported to Dr.  pcMurrell Converse panic CO2 reported to Dr. SuMindi Slicker Result(s) reported on 17 Jan 2014 at 09:22AM.    21-Dec-15 09:12, CBC Profile  Result Comment   HGB/HCT - RESULTS VERIFIED BY REPEAT TESTING.   Result(s) reported on 17 Jan 2014 at 09:54AM.    21-Dec-15 09:12, Comprehensive Metabolic Panel  Glucose, Serum 162  BUN 69  Creatinine (comp) 5.09  Sodium, Serum 138  Potassium, Serum 6.8  Chloride, Serum 109  CO2, Serum 26  Calcium (Total), Serum 8.1  Osmolality (calc) 299  eGFR (African American) 11  eGFR (Non-African American) 9  eGFR values <6058min/1.73  m2 may be an indication of chronic  kidney disease (CKD).  Calculated eGFR, using the MRDR Study equation, is useful in   patients with stable renal function.  The eGFR calculation will not be reliable in acutely ill patients  when serum creatinine is changing rapidly. It is not useful in  patients on dialysis. The eGFR calculation may not be applicable  to patients at the low and high extremes of body sizes, pregnant  women, and vegetarians.  Result Comment   POTASSIUM - RESULTS VERIFIED BY REPEAT TESTING.   - NOTIFIED OF CRITICAL VALUE   - SPOKE WITH LYDIA YOUNG AT 1005 01/17/14   - SDR   - READ-BACK PROCESS PERFORMED.   Result(s) reported on 17 Jan 2014 at 09:54AM.  Anion Gap 3  Cardiac:    20-Dec-15 19:00, Troponin I  Troponin I 0.04  0.00-0.05  0.05 ng/mL or less: NEGATIVE   Repeat testing in 3-6 hrs   if clinically indicated.  >0.05 ng/mL: POTENTIAL   MYOCARDIAL INJURY. Repeat   testing in 3-6 hrs if   clinically indicated.  NOTE: An increase or decrease   of 30% or more on serial   testing suggests a   clinically important change    21-Dec-15 09:12, CK, Total  CK, Total 31  Result(s) reported on 17 Jan 2014 at 10:11AM.  Routine UA:    20-Dec-15 21:03, Urinalysis  Color (UA) Amber  Clarity (UA) Cloudy  Glucose (UA) Negative  Bilirubin (UA) Negative  Ketones (UA) Negative  Specific Gravity (UA) 1.025  Blood (UA) Negative  pH (UA) 5.0  Protein (UA)   100 mg/dL  Nitrite (UA) Negative  Leukocyte Esterase (UA) Trace  Result(s) reported on 16 Jan 2014 at 10:55PM.  RBC (UA)   NONE SEEN  WBC (UA) 28 /HPF  Bacteria (UA) TRACE  Epithelial Cells (UA) 5 /HPF  WBC Clump (UA) PRESENT  Hyaline Cast (UA) 10 /LPF  Result(s) reported on 16 Jan 2014 at 10:55PM.  Routine Hem:    20-Dec-15 19:00, Hemogram, Platelet Count  WBC (CBC) 7.5  RBC (CBC) 2.79  Hemoglobin (CBC) 6.8  Hematocrit (CBC) 23.4  Platelet Count (CBC) 119  Result(s) reported on 16 Jan 2014 at 07:26PM.   MCV 84  MCH 24.5  MCHC 29.2  RDW 17.6    21-Dec-15 09:12, CBC Profile  WBC (CBC) 11.5  RBC (CBC) 3.14  Hemoglobin (CBC) 7.8  Hematocrit (CBC) 27.6  Platelet Count (CBC) 143  MCV 88  MCH 24.7  MCHC 28.1  RDW 18.4  Neutrophil % 61.1  Lymphocyte % 21.4  Monocyte % 11.0  Eosinophil % 5.8  Basophil % 0.7  Neutrophil # 7.0  Lymphocyte # 2.5  Monocyte # 1.3  Eosinophil # 0.7  Basophil # 0.1   RADIOLOGY:  Radiology Results: XRay:    31-Mar-15 21:34, Chest Portable Single View  Chest Portable Single View  REASON FOR EXAM:    chest pressure  COMMENTS:       PROCEDURE: DXR - DXR PORTABLE CHEST SINGLE VIEW  - Apr 27 2013  9:34PM     CLINICAL DATA:  Chest pressure    EXAM:  PORTABLE CHEST - 1 VIEW    COMPARISON:  07/05/2011    FINDINGS:  Prior median sternotomy and left subclavian pacemaker noted.  Cardiomegaly evident with vascular congestion but no definite CHF or  focal pneumonia. No large effusion or pneumothorax. Trachea midline.  Limited exam because of portable technique and body habitus.     IMPRESSION:  Stable postoperative findings.    Cardiomegaly with vascular congestion  Electronically Signed    By: Daryll Brod M.D.    On: 04/27/2013 21:36         Verified By: Earl Gala, M.D.,    16-Jun-15 14:18, Chest Portable Single View  Chest Portable Single View  REASON FOR EXAM:    Chest Pain  COMMENTS:       PROCEDURE: DXR - DXR PORTABLE CHEST SINGLE VIEW  - Jul 13 2013  2:18PM     CLINICAL DATA:  Shortness of breath with syncopal episodes over the  last 2 days. History of pacemaker, valve replacement and breast  biopsy.    EXAM:  PORTABLE CHEST - 1 VIEW    COMPARISON:  Radiographs 04/27/2013 and 07/05/2011.    FINDINGS:  1358 hr. Left subclavian pacemaker leads appear unchanged within the  right atrium and right ventricle. The heart size and mediastinal  contours are stable status post median sternotomy. The lungs are  clear.  There is no pleural effusion or pneumothorax. No acute  osseous findings are evident.     IMPRESSION:  Stable postoperative chest with mild cardiomegaly and chronic  vascular congestion.      Electronically Signed    By: Camie Patience M.D.    On: 07/13/2013 14:24     Verified By: Vivia Ewing, M.D.,    02-Sep-15 00:16, Chest Portable Single View  Chest Portable Single View  REASON FOR EXAM:    sepsis  COMMENTS:       PROCEDURE: DXR - DXR PORTABLE CHEST SINGLE VIEW  - Sep 29 2013 12:16AM     CLINICAL DATA:  sepsis, post central line placement    EXAM:  PORTABLE CHEST - 1 VIEW    COMPARISON:  07/13/2013    FINDINGS:  Right IJ central line has been placed to the distal SVC. No  pneumothorax. Previous AVR. Left subclavian pacemaker stable. Stable  cardiomegaly. Low lung volumes. No focal infiltrate or overt edema.  No effusion.     IMPRESSION:  1. Central line to low SVC without pneumothorax.      Electronically Signed    By: Arne Cleveland M.D.    On: 09/29/2013 00:59         Verified By: Kandis Cocking, M.D.,    20-Dec-15 19:25, Chest Portable Single View  Chest Portable Single View  REASON FOR EXAM:    chest pain  COMMENTS:       PROCEDURE: DXR - DXR PORTABLE CHEST SINGLE VIEW  - Jan 16 2014  7:25PM     CLINICAL DATA:  Chest pain for several hours    EXAM:  PORTABLE CHEST - 1 VIEW    COMPARISON:  09/28/2013    FINDINGS:  Left-sided pacemaker overlies large cardiac silhouette. Lung bases  are poorly evaluated. Potential bilateral pleural effusions. Upper  lungs appear clear.     IMPRESSION:  Cardiomegaly and potential bilateral effusions. Lung bases poorly  evaluated.      Electronically Signed    By: Suzy Bouchard M.D.    On: 01/16/2014 19:43         Verified By: Rennis Golden, M.D.,    21-Dec-15 10:36, Chest Portable Single View  Chest Portable Single View  REASON FOR EXAM:    intubation verification  COMMENTS:        PROCEDURE: DXR - DXR PORTABLE CHEST SINGLE VIEW  - Jan 17 2014 10:36AM     CLINICAL DATA:  Intubation.    EXAM:  PORTABLE CHEST -  1 VIEW    COMPARISON:  01/16/2014    FINDINGS:  Interval endotracheal tube placement with tip approximately 3 cm  above the carina. Dual lead pacemaker and sequelae of prior median  sternotomy are again identified. Cardiac silhouette remains  enlarged. Right basilar opacity is stable to slightly increased and  may reflect a combination of layering pleural effusion and  parenchymal lung opacities. No sizable left pleural effusion is  identified. No pneumothorax is identified.     IMPRESSION:  1. Interval endotracheal tube placement as above.  2. Stable to slight worsening of right basilar opacity which may  reflect a combination of pleural effusion and atelectasis versus  pneumonia.      Electronically Signed    By: Logan Bores    On: 01/17/2014 10:44         Verified By: Ferol Luz, M.D.,    21-Dec-15 10:36, KUB - Kidney Ureter Bladder  KUB - Kidney Ureter Bladder  REASON FOR EXAM:    og tube placement verification  COMMENTS:       PROCEDURE: DXR - DXR KIDNEY URETER BLADDER  - Jan 17 2014 10:36AM     CLINICAL DATA:  Orogastric tube placement.    EXAM:  ABDOMEN - 1 VIEW    COMPARISON:  None.    FINDINGS:  Enteric tube is partially looped in the left upper abdomen with tip  directed cranially in the expected region of the proximal stomach.  Side hole is well beyond the GE junction. Right upper quadrant  surgical clips are noted. Gas is partially visualized innondilated  colon. Right pleural effusion and right basilar lung opacities are  more fully evaluated on concurrent chest radiograph.     IMPRESSION:  Enteric tube overlying the stomach.      Electronically Signed    By: Logan Bores    On: 12/21/201510:46         Verified By: Ferol Luz, M.D.,    21-Dec-15 13:18, Chest Portable Single View  Chest Portable  Single View  REASON FOR EXAM:    verify right IJ central line placement  COMMENTS:       PROCEDURE: DXR - DXR PORTABLE CHEST SINGLE VIEW  - Jan 17 2014  1:18PM     CLINICAL DATA:  Right-sided at central venous line placement    EXAM:  PORTABLE CHEST - 1 VIEW    COMPARISON:  CT 01/17/2014    FINDINGS:  Interval placement of a right IJ line with tip the distal SVC. NG  endotracheal tube and NG tube are unchanged. Stable enlarged cardiac  silhouette. There are bilateral pleural effusions. No pneumothorax  identified.     IMPRESSION:  1. Placement of right central venous line with no pneumothorax  identified.  2. Cardiomegaly and bilateral pleural effusions unchanged.      Electronically Signed    By: Suzy Bouchard M.D.    On: 01/17/2014 13:47         Verified By: Rennis Golden, M.D.,  [place]:    01-Apr-15 15:01, US Kidney Bilateral  US Kidney Bilateral  REASON FOR EXAM:    acute renal failure  COMMENTS:       PROCEDURE: [place]  - US KIDNEY  - Apr 28 2013  3:01PM     CLINICAL DATA:  Acute renal failure.    EXAM:  RENAL/URINARY TRACT ULTRASOUND COMPLETE    COMPARISON:  None.    FINDINGS:  Right Kidney:  Length: 10.2 cm  years. Echogenicity within normal limits. No mass or  hydronephrosis visualized.    Left Kidney:    Length: 10.9 cm. Echogenicity within normal limits. No mass or  hydronephrosis visualized.    Bladder:    Decompressed by a Foley catheter     IMPRESSION:  Normal renal ultrasound.  No hydronephrosis.  Electronically Signed    By: Lajean Manes M.D.    On: 04/28/2013 16:04         Verified By: Lasandra Beech, M.D.,    17-Jun-15 11:07, US Carotid Doppler Bilateral  US Carotid Doppler Bilateral  REASON FOR EXAM:    syncope  COMMENTS:       PROCEDURE: [place]  - US CAROTID DOPPLER BILATERAL  - Jul 14 2013 11:07AM     CLINICAL DATA:  syncope, blurry vision, diabetes, aortic valve  surgery    EXAM:  BILATERAL CAROTID DUPLEX  ULTRASOUND    TECHNIQUE:  Pearline Cables scale imaging, color Doppler and duplex ultrasound was  performed of bilateral carotid and vertebral arteries in the neck.  COMPARISON:  None.    REVIEW OF SYSTEMS:  Quantification of carotid stenosis is based on velocity parameters  that correlate the residual internal carotid diameter with  NASCET-based stenosis levels, using the diameter of the distal  internal carotid lumen as the denominator for stenosis measurement.    The following velocity measurements were obtained:    PEAK SYSTOLIC/END DIASTOLIC    RIGHT    ICA:                     141/37cm/sec  CCA:                     161/09UE/AVW    SYSTOLIC ICA/CCA RATIO:  1.2    DIASTOLIC ICA/CCA RATIO: 1.0    ECA:                     150cm/sec    LEFT    ICA:                     169/36cm/sec    CCA:                     098/11BJ/YNW    SYSTOLIC ICA/CCA RATIO:  1.1  DIASTOLIC ICA/CCA RATIO: 1.4    ECA:                     177cm/sec    FINDINGS:  RIGHT CAROTID ARTERY: Mild eccentric plaque in the carotid bulb. No  significant stenosis. Normal waveforms and color Doppler signal.    RIGHT VERTEBRAL ARTERY:  Normal flow direction and waveform.    LEFT CAROTID ARTERY: Mild eccentric partially calcified plaque in  the bulb without significant stenosis. Normal waveforms and color  Doppler signal.    LEFT VERTEBRAL ARTERY: Normal flow direction and waveform.   IMPRESSION:  1. Mild bilateral carotid bifurcation plaque resulting in less than  50% diameter stenosis. The exam does not exclude plaque ulceration  or embolization. Continued surveillance recommended.      Electronically Signed    By: Arne Cleveland M.D.    On: 07/14/2013 14:08         Verified By: Kandis Cocking, M.D.,    20-Dec-15 23:15, US Kidney Bilateral  US Kidney Bilateral  REASON FOR EXAM:    arf  COMMENTS:       PROCEDURE: [place]  -  US KIDNEY  - Jan 16 2014 11:15PM     CLINICAL DATA:  Acute renal  failure    EXAM:  RENAL/URINARY TRACT ULTRASOUND COMPLETE    COMPARISON:  04/28/2013    FINDINGS:  Right Kidney:  Length: 10 cm. Echogenicity within normal limits. No mass or  hydronephrosis visualized.    Left Kidney:    Length: 10.6 cm. Echogenicity within normal limits. No mass or  hydronephrosis visualized.    Bladder:    Not visualized.  Presumably decompressed.    Exam detail is suboptimal due to patient body habitus.     IMPRESSION:  Normal exam.      Electronically Signed    By: Conchita Paris M.D.    On: 01/16/2014 23:48         Verified By: Arline Asp, M.D.,  [place]:    31-Mar-15 21:34, Chest Portable Single View  PACS Image    01-Apr-15 15:01, US Kidney Bilateral  PACS Image    16-Jun-15 14:18, Chest Portable Single View  PACS Image    16-Jun-15 14:49, CT Head Without Contrast  PACS Image    17-Jun-15 11:07, US Carotid Doppler Bilateral  PACS Image    02-Sep-15 00:16, Chest Portable Single View  PACS Image    02-Sep-15 01:33, CT Pelvis Without Contrast  PACS Image    20-Dec-15 19:25, Chest Portable Single View  PACS Image    20-Dec-15 23:15, US Kidney Bilateral  PACS Image    21-Dec-15 10:36, Chest Portable Single View  PACS Image    21-Dec-15 10:36, KUB - Kidney Ureter Bladder  PACS Image    21-Dec-15 13:18, Chest Portable Single View  PACS Image  CT:    16-Jun-15 14:49, CT Head Without Contrast  CT Head Without Contrast  REASON FOR EXAM:    syncope, head injury  COMMENTS:       PROCEDURE: CT  - CT HEAD WITHOUT CONTRAST  - Jul 13 2013  2:49PM     CLINICAL DATA:  Dizziness and altered vision    EXAM:  CT HEAD WITHOUT CONTRAST    TECHNIQUE:  Contiguous axial images wereobtained from the base of the skull  through the vertex without intravenous contrast.    COMPARISON:  None.  FINDINGS:  The ventricles are normal in size and configuration. There is no  mass, hemorrhage, extra-axial fluid collection, or midline  shift.  There is slight periventricular small vessel disease in the centra  semiovale bilaterally. Elsewhere gray-white compartments appear  normal. There is no demonstrable acute infarct. Bony calvarium  appears intact. The mastoid air cells are clear.     IMPRESSION:  Slight periventricular small vessel disease. No intracranial mass,  hemorrhage, or acute appearing infarct.      Electronically Signed    By: Lowella Grip M.D.    On: 07/13/2013 14:59         Verified By: Leafy Kindle. WOODRUFF, M.D.,    02-Sep-15 01:33, CT Pelvis Without Contrast  CT Pelvis Without Contrast  REASON FOR EXAM:    labial cellulitis eval sepsis  COMMENTS:       PROCEDURE: CT  - CT PELVIS STANDARD WO  - Sep 29 2013  1:33AM     CLINICAL DATA:  Weakness and diarrhea x 2 days. Sepsis. Labial  cellulitis.    EXAM:  CT PELVIS WITHOUT CONTRAST    TECHNIQUE:  Multidetector CT imaging of the pelvis was performed following the  standard protocol without intravenous contrast.  COMPARISON:  04/28/2013 renal ultrasound    FINDINGS:  Low position of the right kidney with a nonobstructing interpolar  stone. No hydroureteronephrosis. Decompressed bladder. No  appreciable abnormality of the uterus and right adnexa. Left adnexal  cyst measuring 3 cm and water attenuation. Visualized bowel loops  are of normal course and caliber. Normal appendix. Fat containing  umbilical hernia.    Locules of air within the subcutaneous fat of the right perineum and  labia. Mild associated/ adjacent subcutaneous fat stranding. There  are few nonspecific locules of air within the vaginal canal.    Pubic symphysis DJD.  No acute osseous finding.   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    Critical Value/emergent results were called by telephone at the time  of interpretation on 09/29/2013 at 2:16 am to Dr. Charlesetta Ivory ,  who  verbally acknowledged these results.      Electronically Signed    By: Carlos Levering M.D.    On: 09/29/2013 02:17         Verified By: Tommi Rumps, M.D.,   ASSESSMENT AND PLAN:  Assessment/Admission Diagnosis [facility] AR[facility] morbid obesity cardiac disease   Plan temp dialysis catheter placed in left neck Groin line unable to be placed due to huge size CXR pending   level 3   Electronic Signatures: Algernon Huxley (MD)  (Signed 21-Dec-15 14:09)  Authored: Chief Complaint and History, PAST MEDICAL/SURGICAL HISTORY, ALLERGIES, HOME MEDICATIONS, Family and Social History, Review of Systems, Physical Exam, LABS, RADIOLOGY, Assessment and Plan   Last Updated: 21-Dec-15 14:09 by Algernon Huxley (MD)

## 2014-05-21 NOTE — Consult Note (Signed)
PATIENT NAME:  Theresa Leon, Theresa Leon MR#:  [id number] DATE OF BIRTH:  1955/04/20  DATE OF CONSULTATION:  09/29/2013  REFERRING PHYSICIAN:   CONSULTING PHYSICIAN:  Bhavika Schnider A. Marina Gravel, MD FACS  REASON FOR CONSULTATION: Possible necrotizing soft tissue infection.   HISTORY OF PRESENT ILLNESS:  This is a morbidly obese 60 year old white female with a history of 4 days of right labial swelling and some discomfort but no drainage by the patient's report as well as fever. She comes into the Emergency Room with worsening pain and low blood pressure. She received 4 liter of normal saline in the Emergency Room. Internal jugular catheter was placed, and she was started on Levophed. She was feeling poorly most of the day, weak and lightheaded. CT scan imaging of the pelvis demonstrates extensive soft tissue air within the right labial fold and perianal region.  No obvious abscess is noted on initial physical examination. Surgical services were consulted.   PHYSICAL EXAMINATION:  GENERAL: A pleasant, alert and oriented white female. non-toxic appearing. VITAL SIGNS: Temperature is 98.5, pulse is 70, blood pressure was 99/47. This was on 3 liters of nasal cannula.  ABDOMEN: Soft and nontender.  LUNGS: Clear.  HEART: No obvious murmur.  RECTAL: Perianal examination demonstrates some induration and some very mild erythema of the right perianal region, lower labia on the right side. There is 1 punctum of  in the skin which looks more like a sebaceous cyst but no clear evidence of necrotic skin, blistering blanching erythema or obvious drainage.   LABORATORY VALUES:  White count is 15.7000 with a left shift. Hemoglobin is 8.9, platelet count 98,000, creatinine 3.54, BUN 38, glucose 151, sodium 131, potassium 4.0, lipase 123. Liver function tests are normal. Urinalysis demonstrates a few white cells in the urine. Lactic acid is 1.4.   Review of CT scan demonstrates left adnexal cyst, normal appendix, normal bowels, locules of  air within the subcutaneous right perineum and labia with associated fat stranding, no fluid collection is seen.  IMPRESSION:  A 59 year old morbidly obese white female with diabetes, hypertension presenting with sepsis, hypotension and physical and radiographic findings consistent with possible necrotizing soft tissue infection. She also has acute renal failure on top of chronic renal insufficiency, leukocytosis and a chronic thrombocytopenia.   RECOMMENDATIONS: I agree with your choice of antibiotics. In addition, the patient needs urgent exploration in the Operating Room and drainage of any soft tissue infection which in my opinion at this point is rather occult on examination.  Discussed with her my concerns and need for urgent surgery and possible multiple surgical interevntions based on operative finding and subsequent clinical course.  ____________________________ Jeannette How. Marina Gravel, MD FACS mab:aw D: 09/29/2013 02:40:18 ET T: 09/29/2013 04:46:04 ET JOB#: [id number]  cc: Elta Guadeloupe A. Marina Gravel, MD, <Dictator> Chester Romero A Andrae Claunch MD ELECTRONICALLY SIGNED 09/29/2013 20:55

## 2014-05-21 NOTE — Consult Note (Signed)
Chief Complaint:  Subjective/Chief Complaint Patient with iron def. anemia who is intubated and in the IUC on a vent. The paitent has had no sign of any gross bleeding but only raised her Hb by 0.1 after a unit of blood.   VITAL SIGNS/ANCILLARY NOTES: **Vital Signs.:   23-Dec-15 06:00  Vital Signs Type Routine  Temperature Temperature (F) 100.8  Celsius 38.2  Temperature Source oral  Pulse Pulse 80  Respirations Respirations 16  Systolic BP Systolic BP 96  Diastolic BP (mmHg) Diastolic BP (mmHg) 47  Mean BP 63  Pulse Ox % Pulse Ox % 95  Oxygen Delivery Ventilator Assisted   Brief Assessment:  GEN critically ill appearing, Obese   Respiratory On. Vent   Lab Results: Routine Chem:  23-Dec-15 04:35   Result Comment LABS - This specimen was collected through an   - indwelling catheter or arterial line.  - A minimum of 69ms of blood was wasted prior    - to collecting the sample.  Interpret  - results with caution.  Result(s) reported on 19 Jan 2014 at 07:03AM.  Glucose, Serum  133  BUN  48  Creatinine (comp)  3.21  Sodium, Serum 140  Potassium, Serum 4.5  Chloride, Serum  109  CO2, Serum 27  Calcium (Total), Serum  8.0  Anion Gap  4  Osmolality (calc) 294  eGFR (African American)  19  eGFR (Non-African American)  16 (eGFR values <660mmin/1.73 m2 may be an indication of chronic kidney disease (CKD). Calculated eGFR, using the MRDR Study equation, is useful in  patients with stable renal function. The eGFR calculation will not be reliable in acutely ill patients when serum creatinine is changing rapidly. It is not useful in patients on dialysis. The eGFR calculation may not be applicable to patients at the low and high extremes of body sizes, pregnant women, and vegetarians.)  Routine Hem:  23-Dec-15 04:35   WBC (CBC)  11.2  RBC (CBC)  2.72  Hemoglobin (CBC)  6.9  Hematocrit (CBC)  22.1  Platelet Count (CBC)  106  MCV 81  MCH  25.4  MCHC  31.3  RDW  17.4   Neutrophil % 41.0  Lymphocyte % 42.0  Monocyte % 11.4  Eosinophil % 4.8  Basophil % 0.8  Neutrophil # 4.6  Lymphocyte #  4.7  Monocyte #  1.3  Eosinophil # 0.5  Basophil # 0.1   Assessment/Plan:  Assessment/Plan:  Assessment Anemia with failure to correct with tranfusion.   Plan Patient not a candidate for procedures at this time and no sign of gross bleeding.  Continue transfusions and follow Hb. EGD and colonoscopy when stable.   Electronic Signatures: WoLucilla LameMD)  (Signed 23-Dec-15 09:14)  Authored: Chief Complaint, VITAL SIGNS/ANCILLARY NOTES, Brief Assessment, Lab Results, Assessment/Plan   Last Updated: 23-Dec-15 09:14 by WoLucilla LameMD)

## 2014-05-21 NOTE — Op Note (Signed)
PATIENT NAME:  Theresa Leon, PRENTISS MR#:  [id number] DATE OF BIRTH:  13-Nov-1955  DATE OF PROCEDURE:  10/01/2013  OPERATION PERFORMED: Debridement of necrotizing soft tissue infection, right perineum (subcutaneous tissue).   PREOPERATIVE DIAGNOSIS: Necrotizing soft tissue infection, right perineum.   POSTOPERATIVE DIAGNOSIS: Necrotizing soft tissue infection, right perineum.   SURGEON: Consuela Mimes, MD    ANESTHESIA: General.   PROCEDURE IN DETAIL: The patient was placed in the lithotomy position and prepped and draped in the usual sterile fashion.  She continued to have liquid diarrhea throughout the procedure, but this did not contaminate the wound.  The wound was inspected and went all the way up to the superior pubic ramus and in that area, the upper 15% did contain some necrosis. This was further debrided back to healthy non-necrotic tissue utilizing the Russian forceps and the electrocautery.  The remainder of the wound (85%) had very early formation of pink granulation tissue covering it and had no evidence of necrosis.  There was no area wherein there was any foul-smelling material, and there was no drainage and no pus. One area that was debrided bled and this was controlled with a figure-of-eight 2-0 Vicryl.  The wound was then irrigated and packed with an entire Kerlix sponge that had been soaked in normal saline completing the procedure.  The patient tolerated the procedure well.  There were no complications.      ____________________________ Consuela Mimes, MD wfm:DT D: 10/01/2013 15:20:39 ET T: 10/01/2013 16:37:41 ET JOB#: [id number]  cc: Consuela Mimes, MD, <Dictator> Consuela Mimes MD ELECTRONICALLY SIGNED 10/02/2013 17:46

## 2014-05-21 NOTE — H&P (Signed)
PATIENT NAME:  Theresa Leon, Theresa Leon MR#:  [id number] DATE OF BIRTH:  28-Dec-1955  DATE OF ADMISSION:  09/29/2013  REFERRING PHYSICIAN: Debbrah Alar, MD   PRIMARY CARE PHYSICIAN: [facility], Ellamae Sia, MD  CHIEF COMPLAINT: Weakness.   HISTORY OF PRESENT ILLNESS: A 59 year old Caucasian female with history of obesity, obstructive sleep apnea, bioprosthetic aortic valve, permanent pacemaker insertion, presenting with weakness. Describes 4-day duration of gradual weakness, fatigue, which originally started after noticing a right labial abscess. She denies any drainage; however, she states that the area is somewhat painful, but she has been having associated fevers and chills for the same 4-day duration since that time period. Once again, worsening symptoms, worsening fatigue, generalized weakness and poor p.o. intake to the point where she was lightheaded upon standing and attempting to ambulate. She also describes having 1-day duration of diarrhea, multiple bouts of watery stool, nonbloody, non-mucoid. She presented to the hospital for further workup and evaluation. In the Emergency Department, noted to be hypotensive, requiring 4 liters normal saline thus far. Had a central venous catheter in the right IJ placed and started on Levophed to maintain blood pressure. Currently, surprisingly, complaining only of generalized fatigue.   REVIEW OF SYSTEMS:  CONSTITUTIONAL: Positive for fevers, chills, fatigue, weakness.  EYES: Denies blurry vision, double vision, eye pain.  EARS, NOSE, THROAT: Denies tinnitus, ear pain, hearing loss.  RESPIRATORY: Denies cough, wheeze, shortness of breath.  CARDIOVASCULAR: Denies chest pain, palpitations, edema.  GASTROINTESTINAL: Denies nausea, vomiting. Positive for diarrhea as described above. Denies any abdominal pain.  GENITOURINARY: Denies dysuria or hematuria. Positive for labial lesion as described.  ENDOCRINE: Denies nocturia or thyroid problems.   HEMATOLOGY AND LYMPHATIC: Denies easy bruising or bleeding.  SKIN: Denies rashes or lesions other that the right labial lesion as described above.  MUSCULOSKELETAL: Denies pain in neck, back, shoulder, knees, hips or arthritic symptoms.  NEUROLOGIC: Denies paralysis or paresthesias.  PSYCHIATRIC: Denies anxiety or depressive symptoms.  Otherwise, full review of systems performed by me is negative.   PAST MEDICAL HISTORY:  1. Asthma. 2. Obstructive sleep apnea, requiring CPAP therapy.  3. Bioprosthetic aortic valve replacement.  4. Permanent pacemaker insertion. 5. Diabetes.   SOCIAL HISTORY: Remote tobacco use. Denies any alcohol or drug use.   FAMILY HISTORY: Positive for diabetes, hypertension.  ALLERGIES: LOPRESSOR, NITRATES, NITRO-DUR, TETANUS TOXOID, DUST AND TAPE.   HOME MEDICATIONS:  1. Acetaminophen/hydrocodone 325/5 mg p.o. q.6 hours as needed.  2. Gabapentin 600 mg p.o. 3 times daily. 3. Citalopram 20 mg p.o. daily. 4. Trazodone  50 mg p.o. at bedtime. 5. Glipizide 2.5 mg p.o. daily.  6. Metformin 500 mg p.o. daily. 7. Meclizine 25 mg p.o. q.6 hours as needed. 8. Xyzal 5 mg p.o. daily. 9. Ranitidine 150 mg p.o. b.i.d.  10. Singulair 10 mg p.o. daily. 11. Synthroid 100 mcg p.o. daily.   PHYSICAL EXAMINATION:  VITAL SIGNS: Temperature 98.4, heart rate 66, respirations 18, blood pressure on arrival 72/25, current blood pressure 92/72, saturating 100% on supplemental O2, weight 154.2 kg, BMI 53.3.  GENERAL: Obese Caucasian female, surprisingly in no acute distress.  HEAD: Normocephalic, atraumatic.  EYES: Pupils equal, round and reactive to light. Extraocular muscles intact. No scleral icterus.  MOUTH: Moist mucous membranes. Dentition intact. No abscess noted.  EARS, NOSE, THROAT: Clear, without exudates. No external lesions. NECK: Supple. No thyromegaly. No nodules. No JVD.  PULMONARY: Clear to auscultation bilaterally without wheezes, rales or rhonchi. No use of  accessory muscles. Good  respiratory effort.  CHEST: Nontender to palpation. CARDIOVASCULAR: S1, S2, regular rate and rhythm. No murmurs, rubs or gallops. Trace pedal edema bilaterally. Pedal pulses 2+ bilaterally.  GASTROINTESTINAL: Soft, nontender, nondistended, obese. Positive bowel sounds. No hepatosplenomegaly.  MUSCULOSKELETAL: No swelling, clubbing or edema. Range of motion full in all extremities.  NEUROLOGIC: Cranial nerves II through XII intact. No gross focal neurological deficits. Sensation intact. Reflexes intact.  SKIN: A raised labial lesion noted, about 1 to 2 cm abscess with surrounding erythema. There was no discharge at this time.  PSYCHIATRIC: Mood and affect within normal limits. The patient awake, alert and oriented x3. Insight and judgment intact.   LABORATORY DATA: Sodium 131, potassium 4, chloride 107, bicarbonate 23, BUN 30, creatinine 3.54, glucose 151. LFTs: Protein 6.2, albumin 2.4, bilirubin 1.4, alkaline phosphatase 54, AST 17, ALT 16. Troponin less than 0.02. WBC 15.3, hemoglobin 8.9, platelets of 98. Urinalysis: WBC 17, RBC is 16, leukocyte esterase trace, however, with 1 epithelial cell. Lactic acid 1.4. Chest x-ray performed. Central line without pneumothorax. No acute cardiopulmonary process. CT pelvis performed. Extensive locules of air within the right perineum and the labia concerning for necrotizing perineal fasciitis.    ASSESSMENT AND PLAN: A 59 year old Caucasian female with history of obesity and diabetes, presenting with weakness.   1. Severe sepsis with shock with renal failure and lactic acidosis, currently on pressors. Panculture, including blood, urine, and CT pelvis concerning for gas formation. The case was discussed with surgery on call, and they are coming to evaluate now. Emperic antibiotics vancomycin and Zosyn and clindamycin. Will wait further recommendations from surgery at this time. Provide IV fluid hydration to keep mean arterial pressure  greater than 65. Trend CVP with goal 8 to 10.  2. Acute kidney injury. IV fluid hydration as above. Follow urine output and renal function.  3. Diabetes. Insulin sliding scale, q.6 hour Accu-Cheks. Hold p.o. agents.  4. Obstructive sleep apnea, on CPAP therapy.  5. Gastroesophageal reflux disease. H2 blockers. 6. Deep venous thrombosis prophylaxis. Heparin subcutaneous.  CODE STATUS: The patient is full code.  CRITICAL CARE TIME SPENT: 65 minutes.   ____________________________ Aaron Mose. Hower, MD dkh:lb D: 09/29/2013 02:27:50 ET T: 09/29/2013 05:46:14 ET JOB#: [id number]  cc: Aaron Mose. Hower, MD, <Dictator> DAVID Woodfin Ganja MD ELECTRONICALLY SIGNED 10/06/2013 12:35

## 2014-05-21 NOTE — Op Note (Signed)
PATIENT NAME:  Theresa Leon, Theresa Leon MR#:  [id number] DATE OF BIRTH:  [birth year]  DATE OF PROCEDURE:  09/29/2013  PREOPERATIVE DIAGNOSES: 1.  Sepsis.  2.  Fournier gangrene.  3.  Hypoxemia.  4.  Volume overload.  5.  Acute renal failure.   POSTOPERATIVE DIAGNOSES: 1.  Sepsis.  2.  Fournier gangrene.  3.  Hypoxemia.  4.  Volume overload.  5.  Acute renal failure.   PROCEDURE PERFORMED: Placement of a triple lumen catheter, left jugular approach, through same venous access with fluoroscopic guidance.   SURGEON: Hortencia Pilar, MD  DESCRIPTION OF PROCEDURE: The patient is brought from the intensive care unit to special procedures and placed on the fluoro table. Her left neck is prepped and draped in a sterile fashion and then a single lumen catheter is used as the existing central access. Wire is then advanced under fluoroscopy. Ultimately, the wire is negotiated down to the superior vena cava. The single lumen central venous access is removed and the dilator passed over the wire. The triple-lumen catheter is advanced over the wire and positioned with its tip at the confluence of the innominate veins and the hub placed at the level of the skin incision. This is secondary to the overall length of the catheter and the patient's morbidly obese body habitus. The catheter is then secured to the skin of the left with 2-0 silk. All 3 lumens aspirate and flush easily. A sterile dressing with a Biopatch is applied. The patient tolerated the procedure well. A sterile dressing is applied.   ____________________________ Katha Cabal, MD ggs:sb D: 10/01/2013 12:45:44 ET T: 10/01/2013 13:19:04 ET JOB#: [id number]  cc: Katha Cabal, MD, <Dictator> Katha Cabal MD ELECTRONICALLY SIGNED 10/05/2013 22:41

## 2014-05-21 NOTE — H&P (Signed)
PATIENT NAME:  Theresa, Leon MR#:  [id number] DATE OF BIRTH:  March 22, 1955  DATE OF ADMISSION:  07/13/2013  PRIMARY CARE PHYSICIAN:  Dr. Kingsley Spittle, [facility].   CHIEF COMPLAINT: Syncopal episode today.   HISTORY OF PRESENT ILLNESS: [name] is a 59 year old, obese, Caucasian female with past medical history of obstructive sleep apnea, history of valvular disease status post valve replacement, history of afib status post pacemaker, multiple syncopal episodes, comes to the Emergency Room after she had a syncopal episode in the bathroom today. The patient states she has been having diarrhea since yesterday, has had some "passing out spell" for the last 3 days. She states it only happens for 1 to 2 minutes and she is absolutely fine after that. In the Emergency Room, the patient was found to be hypotensive with blood pressure systolic and 77, down to 64. She was getting wide-open IV fluids. She was found to be dehydrated likely from GI losses and poor p.o. intake. Her creatinine is up to 3.4. Baseline creatinine is 1.4. She is being admitted with acute renal failure secondary to GI losses.  PAST MEDICAL HISTORY:  1.  Asthma.  2.  Sleep apnea on CPAP.  3.  Pseudogout.  4.  Migraine headaches.  5.  History of aortic stenosis.  6.  Valvular heart disease, status post bovine valve replacement.  7.  History of chronic afib. 8.  Morbid obesity.  9.  History of frequent syncopal episodes.  10.  Hypothyroidism.  11.  GERD.   PAST SURGICAL HISTORY: 1.  Tonsillectomy.  2.  Wisdom tooth removal.  3.  Cholecystectomy.  4.  Tubal ligation. 5.  Pacemaker in 2009.  6.  Valve replacement 2008.   SOCIAL HISTORY:  Ex-smoker, quit in 2012. No alcohol or drug use.   FAMILY HISTORY: Significant for diabetes, hypertension and aortic stenosis in mom.  ALLERGIES: NITRATES, TETANUS TOXOID, ANIMAL DANDER, CAT, DUST AND TAPE.  MEDICATIONS:  The patient's medication needs still to be  reconciled by pharmacy tech.  REVIEW OF SYSTEMS:  No fever.  Positive for fatigue, weakness.  EYES: No blurred, double vision, glaucoma or cataracts.  ENT: No tinnitus, ear pain, hearing loss or postnasal drip.  RESPIRATORY: No cough, wheeze, hemoptysis or dyspnea or COPD.  CARDIOVASCULAR: No chest pain, orthopnea, edema. Positive for syncope.  GASTROINTESTINAL: No nausea, vomiting. Positive for diarrhea. No GERD or jaundice. GENITOURINARY: No dysuria, hematuria or frequency.  ENDOCRINE: No polyuria, nocturia or thyroid problems.  HEMATOLOGY: No anemia, easy bruising or bleeding.  SKIN: No acne, rash or lesion.  MUSCULOSKELETAL: Positive for arthritis and back pain.  NEUROLOGIC: No CVA, TIA or dementia.  PSYCHIATRIC: No anxiety, depression. No bipolar disorder.  All other systems reviewed and negative.   PHYSICAL EXAMINATION: GENERAL: The patient is awake, alert, oriented x 3, not in acute distress.  VITAL SIGNS: She is afebrile. Pulse is 60. Blood pressure is 75/45; repeat blood pressure was 64/45. Sats are 99% on 2 liters.  HEENT: Atraumatic, normocephalic. Pupils: PERRLA, EOM intact. Oral mucosa dry.  NECK: Supple. No JVD. No carotid bruit.  RESPIRATORY: Clear to auscultation bilaterally. No rales, rhonchi, respiratory distress or labored breathing.  CARDIOVASCULAR: Both heart sounds are normal. Rate, rhythm regular. PMI nonlateralized. Chest nontender. EXTREMITIES: Feeble pedal pulses. Positive femoral pulses. Trace lower extremity edema.  ABDOMEN: Soft, benign, nontender. No organomegaly. Positive bowel sounds. Obesity present.  NEUROLOGIC: Grossly intact cranial nerves II through XII. No motor or sensory deficit.  PSYCHIATRIC: The  patient is awake, alert, oriented x 3.  SKIN: Warm and dry.  First set of cardiac enzymes is negative. CT of the head essentially negative. No intracranial mass, hemorrhage or acute-appearing infarct.   Chest x-ray: Stable postoperative change with  mild cardiomegaly and chronic vascular congestion. Troponin less than 0.02. H and H is 10.5 and 32.6. Platelet count is 111. Glucose is 151. BUN is 47, creatinine 3.44. Sodium is 138.  EKG shows atrial paced rhythm, incomplete right bundle branch block.   ASSESSMENT: A 59 year old Theresa Leon with history of morbid obesity, sleep apnea, hypotension, comes in with:  1.  Acute renal failure. Appears likely pre-renal with volume depletion as she was recently started having diarrhea. We will give 2 liters of IV bolus fluids and then we will monitor ins and outs and continue IV maintenance fluid. The patient was advised on drinking adequate water at home instead of drinking Pepsi and sweet teas. Will avoid nephrotoxins. Follow up creatinine closely and consider nephrology consultation as needed.  2.  Syncopal episode. The patient has had frequent syncopes on a daily basis. She has chronic history of the same. Her syncope this time could be related to her dehydration. We will monitor on telemetry floor. She had pacemaker. Her rhythm looks paced. 3.  Hypovolemic shock/hypotension secondary to prerenal azotemia.  Her baseline blood pressure she reports between systolic 80 to 929. Her blood pressure right now is 77 systolic and 64. We will give her wide-open fluids and monitor her blood pressure closely.  4.  Type 2 diabetes. I will put her on sliding scale at present.  5.  History of chronic atrial fibrillation.  Unclear if she has atrial fibrillation or this is sinus rhythm with atrial paced rhythm. She is on aspirin. We will continue her aspirin.  Rate is controlled. Consider cardiology consultation if needed.  6.  Obstructive sleep apnea. Use CPAP.  7.  Gastrointestinal prophylaxis on proton pump inhibitor.  8.  Deep vein thrombosis prophylaxis, subcutaneous heparin.   discussed with the patient's sister. The patient is a FULL CODE.   TIME SPENT: 50 minutes.    ____________________________ Hart Rochester  Posey Pronto, MD sap:ce D: 07/13/2013 15:41:06 ET T: 07/13/2013 16:00:37 ET JOB#: [id number]  cc: Sona A. Posey Pronto, MD, <Dictator> Ellamae Sia, MD Ilda Basset MD ELECTRONICALLY SIGNED 07/31/2013 10:31

## 2014-05-21 NOTE — Discharge Summary (Signed)
Dates of Admission and Diagnosis:  Date of Admission 16-Jan-2014   Date of Discharge 01-Jan-0001   Admitting Diagnosis acute respiratory failure.   Final Diagnosis Acute respiratory failure - likely due to Metabolic acidosis secondary to Renal failure and Obstructive sleep apnea- now ventilator dependant, also suspected cardiorenal syndrome Acute renal failure- resolved Pleural effussion Hypotension- likely secondary due to sedative medications anemia- GI bleed- stable- required blood transfusion. morbid obasity    Chief Complaint/History of Present Illness [name] with history of chronic hypotension, asthma, obstructive sleep apnea, aortic valve replacement with a pacemaker, diabetes, presents to the Emergency Room complaining of 3 weeks of abdominal distention, shortness of breath, and weakness.   She does not complain of any orthopnea or lower extremity edema. She has had decreased urination. No burning, no hematuria. Does not have any cough. Has decreased oral intake.  The patient has been closely following with Dr. Dionisio Leon and her primary care physician for some weakness, but there were no new medications started. She is not on any Lasix, no potassium supplementation. She has not noticed any black stools or blood in her stools.   The patient here in the Emergency Room has been found to have blood pressure ranging in the systolic of 45G to 25W which seems to be her baseline. Her stool Hemoccult is positive. She has acute renal failure with a creatinine of 4.77, potassium elevated at 6.6. Chest x-ray showing bilateral small pleural effusions. The patient is being admitted to hospitalist service for further workup and treatment. She has received a dose of IV insulin, dextrose, and calcium gluconate to this point.   The patient was in the hospital September 2015 for a labial abscess, septic shock, had to go through surgery; was also on CRRT briefly for  acute renal failure from septic shock. Prior to discharge, the patient's creatinine was 1.07. She did get blood transfusion during the previous admission.   Allergies:  Tetanus Toxoid: Angioedema  Nitrates: Other  Tape: Other  Animal dander-Cat: Other  Dust: Other  LOpressor: Unknown  Nitro-Dur: Unknown  Thyroid:  20-Dec-15 19:00   Thyroid Stimulating Hormone 2.27 (0.45-4.50 (IU = International Unit)  ----------------------- Pregnant patients have  different reference  ranges for TSH:  - - - - - - - - - -  Pregnant, first trimetser:  0.36 - 2.50 uIU/mL)  Routine Micro:  20-Dec-15 21:03   Micro Text Report URINE CULTURE   COMMENT                   NO GROWTH IN 36 HOURS   ANTIBIOTIC                       Specimen Source IN AND OUT CATH  Culture Comment NO GROWTH IN 36 HOURS  Result(s) reported on 18 Jan 2014 at 12:10PM.    21:43   Micro Text Report BLOOD CULTURE   COMMENT                   NO GROWTH AEROBICALLY/ANAEROBICALLY IN 5 DAYS   ANTIBIOTIC                       Culture Comment NO GROWTH AEROBICALLY/ANAEROBICALLY IN 5 DAYS  Result(s) reported on 21 Jan 2014 at 10:00PM.    21:44   Micro Text Report BLOOD CULTURE   COMMENT  NO GROWTH AEROBICALLY/ANAEROBICALLY IN 5 DAYS   ANTIBIOTIC                       Culture Comment NO GROWTH AEROBICALLY/ANAEROBICALLY IN 5 DAYS  Result(s) reported on 21 Jan 2014 at 10:00PM.  23-Dec-15 10:39   Micro Text Report BLOOD CULTURE   COMMENT                   NO GROWTH AEROBICALLY/ANAEROBICALLY IN 5 DAYS   ANTIBIOTIC                       Specimen Source rt forearm  Culture Comment NO GROWTH AEROBICALLY/ANAEROBICALLY IN 5 DAYS  Result(s) reported on 24 Jan 2014 at 10:00AM.    10:43   Micro Text Report BLOOD CULTURE   ORGANISM 1                COAGULASE NEGATIVE STAPHYLOCOCCUS   COMMENT                   IN ANAEROBE BOTTLE ONLY   COMMENT                   POSSIBLE CONTAMINATION W/SKIN FLORA   GRAM STAIN                 GRAM POSITIVE COCCI IN CLUSTERS   ANTIBIOTIC                       Specimen Source l-ac  Culture Comment IN ANAEROBE BOTTLE ONLY  Organism Name COAGULASE NEGATIVE STAPHYLOCOCCUS  Organism 1 COAGULASE NEGATIVE STAPHYLOCOCCUS  Culture Comment . POSSIBLE CONTAMINATION W/SKIN FLORA  Gram Stain 1 GRAM POSITIVE COCCI IN CLUSTERS    17:15   Micro Text Report SPUTUM CULTURE   ORGANISM 1                MODERATE GROWTH Candida albicans   ORGANISM 2                MODERATE GROWTH CANDIDA SPECIES   COMMENT                   TWO DIFFERENT COLONY MORPHOLOGIES UNABLE TO   COMMENT                   IDENTIFY ORGANISM #2 ANY FURTHER   GRAM STAIN                POOR SPECIMEN-LESS THAN 70% WBC   GRAM STAIN                NO WHITE BLOOD CELLS   GRAM STAIN                NO ORGANISMS SEEN   ANTIBIOTIC                    ORG#1    ORG#2        Specimen Source ETS  Culture Comment TWO DIFFERENT COLONY MORPHOLOGIES UNABLE TO  Organism Name [place]  Organism Name Candida albicans  Organism Quantity MODERATE GROWTH  Organism Quantity MODERATE GROWTH  Organism 1 MODERATE GROWTH Candida albicans  Organism 2 MODERATE GROWTH CANDIDA SPECIES  Culture Comment . IDENTIFY ORGANISM #2 ANY FURTHER  Gram Stain 1 POOR SPECIMEN-LESS THAN 70% WBC  Gram Stain 2 NO WHITE BLOOD CELLS  Gram Stain 3 NO ORGANISMS SEEN    18:16  Micro Text Report URINE CULTURE   COMMENT                   NO GROWTH IN 36 HOURS   ANTIBIOTIC                       Specimen Source INDWELLING CATHETER  Culture Comment NO GROWTH IN 36 HOURS  Result(s) reported on 21 Jan 2014 at 01:10PM.  25-Dec-15 05:40   Micro Text Report BLOOD CULTURE   COMMENT                   NO GROWTH IN 48 HOURS   ANTIBIOTIC                       Culture Comment NO GROWTH IN 48 HOURS  Result(s) reported on 23 Jan 2014 at 05:00AM.  26-Dec-15 08:36   Micro Text Report CLOSTRIDIUM DIFFICILE   C.DIFFICILE ANTIGEN       C.DIFFICILE GDH ANTIGEN :  NEGATIVE   C.DIFFICILE TOXIN A/B     C.DIFFICILE TOXINS A AND B : NEGATIVE   INTERPRETATION            Negative for C. difficile.    ANTIBIOTIC                        Specimen Source stool  Lab:  26-Dec-15 12:35   pH (ABG) 7.400 (7.350-7.450 NOTE: New Reference Range 08/21/13)  PCO2 47 (32-48 NOTE: New Reference Range 09/07/13)  PO2  56 (83-108 NOTE: New Reference Range 08/21/13)  Base Excess  4 (-3-3 NOTE: New Reference Range 09/07/13)  HCO3  29.1 (21.0-28.0 NOTE: New Reference Range 08/21/13)  O2 Device vent  Specimen Site (ABG) RT RADIAL  Specimen Type (ABG) ARTERIAL  Patient Temp (ABG) 37.0  Vt 550  PEEP 8.0  Mechanical Rate 24 (Result(s) reported on 22 Jan 2014 at 12:45PM.)  FiO2 30  O2 Saturation 93.5  Mode ASSIST CONTROL  28-Dec-15 03:58   pH (ABG) 7.430 (7.350-7.450 NOTE: New Reference Range 08/21/13)  PCO2 44 (32-48 NOTE: New Reference Range 09/07/13)  PO2  142 (83-108 NOTE: New Reference Range 08/21/13)  Base Excess  4 (-3-3 NOTE: New Reference Range 09/07/13)  HCO3  29.2 (21.0-28.0 NOTE: New Reference Range 08/21/13)  O2 Device vent  Specimen Site (ABG) RT RADIAL  Specimen Type (ABG) ARTERIAL  Patient Temp (ABG) 37.0  Vt 550  PEEP 8.0  %FiO2 40.0  Mechanical Rate 24 (Result(s) reported on 24 Jan 2014 at 04:16AM.)  Cardiology:  21-Dec-15 10:45   Echo Doppler REASON FOR EXAM:     COMMENTS:     PROCEDURE: [place] [facility] - ECHO DOPPLER COMPLETE(TRANSTHOR)  - Jan 17 2014 10:45AM   RESULT: Echocardiogram Report  Patient Name:   Theresa Leon Date of Exam: 01/17/2014 Medical Rec #:  [id number]        Custom1: Date of Birth:  [birth year]    Height:       67.0 in Patient Age:    33 years      Weight:       361.0 lb Patient Gender: F             BSA:          2.60 m??  Indications: CHF Sonographer:    Sherrie Sport RDCS Referring Phys: Hillary Bow, R  Sonographer Comments: Echo performed with patient supine and on  artificial respirator,  Technically very difficult  study due to very poor  echo windows and The only view obtainable was a modified parasterna.  Summary:  1. Left ventricular ejection fraction, by visual estimation, is 60 to  65%.  2. Normal global left ventricular systolic function.  3. Normal right ventricular size and systolic function.  4. Mild mitral valve regurgitation.  5. Mild tricuspid regurgitation.  6. Mildly elevated pulmonary artery systolic pressure. 2D AND M-MODE MEASUREMENTS (normal ranges within parentheses): Left Ventricle:          Normal IVSd (2D):      1.11 cm (0.7-1.1) LVPWd (2D):     1.41 cm (0.7-1.1) Aorta/LA:                  Normal LVIDd (2D): 5.07 cm (3.4-5.7) Aortic Root (2D): 2.50 cm (2.4-3.7) LVIDs (2D):     3.33 cm           Left Atrium (2D): 3.50 cm (1.9-4.0) LV FS (2D):     34.3 %   (>25%) LV EF (2D):     63.1 %   (>50%)                                   Right Ventricle:                             RVd (2D):        3.47 cm SPECTRAL DOPPLER ANALYSIS (where applicable): LVOT Vmax:  LVOT VTI:  LVOT Diameter: 2.10 cm Tricuspid Valve and PA/RV Systolic Pressure: TR Max Velocity: 2.84 m/s RA  Pressure: 10 mmHg RVSP/PASP: 42.2 mmHg Pulmonic Valve: PV Max Velocity: 1.01 m/s PV Max PG: 4.1 mmHg PV Mean PG: PHYSICIAN INTERPRETATION: Left Ventricle: The left ventricular internal cavity size was normal. LV  posterior wall thickness was normal. No left ventricular hypertrophy.  Global LV systolic function was normal. Left ventricular ejection  fraction, by visual estimation, is 60 to 65%. Spectral Doppler shows  normal pattern of LV diastolic filling. Right Ventricle: Normal right ventricular size, wall thickness, and  systolic function. The right ventricular size is normal. Global RV  systolic function is normal. Left Atrium: The left atrium is normal in size. Right Atrium: The right atrium is normal in size. Pericardium: There is no evidence of pericardial effusion. Mitral Valve: The mitral valve is normal  in structure. Mild mitral valve  regurgitation is seen. Tricuspid Valve: The tricuspid valve is normal. Mild tricuspid   regurgitation is visualized. The tricuspid regurgitant velocity is 2.84  m/s, and with an assumed right atrial pressure of 10 mmHg, the estimated  right ventricular systolic pressure is mildly elevated at 42.2 mmHg. Aortic Valve: The aortic valve is normal. The aortic valve is  structurally normal, with no evidence of sclerosis or stenosis. No  evidence of aortic valve regurgitation is seen. Aorta: The aortic root and ascending aorta are structurally normal, with  no evidence of dilitation. The ascending aorta was not well visualized.  The aortic arch was not well visualized.  [id number] Ida Rogue MD Electronically signed by [id number] Ida Rogue MD Signature Date/Time: 01/17/2014/1:31:18 PM  *** Final *** IMPRESSION: .    Verified By: Minna Merritts, M.D., MD  Routine Chem:  20-Dec-15 19:00   Glucose, Serum  116  BUN  66  Creatinine (comp)  4.77  Sodium, Serum 140  Potassium, Serum  6.6  Chloride, Serum  112  CO2, Serum 23  Calcium (Total), Serum  8.4  Anion Gap  5  Osmolality (calc) 299  eGFR (African American)  12  eGFR (Non-African American)  10 (eGFR values <13m/min/1.73 m2 may be an indication of chronic kidney disease (CKD). Calculated eGFR, using the MRDR Study equation, is useful in  patients with stable renal function. The eGFR calculation will not be reliable in acutely ill patients when serum creatinine is changing rapidly. It is not useful in patients on dialysis. The eGFR calculation may not be applicable to patients at the low and high extremes of body sizes, pregnant women, and vegetarians.)  Result Comment POTASSIUM - RESULTS VERIFIED BY REPEAT TESTING.  - NOTIFIED OF CRITICAL VALUE  - C/ANDREA BRYANT AT 1944 01/16/14.PMH  - READ-BACK PROCESS PERFORMED.  Result(s) reported on 16 Jan 2014 at 07:45PM.  Iron Binding Capacity  (TIBC)  478  Unbound Iron Binding Capacity 452  Iron, Serum  26  Iron Saturation 5 (Result(s) reported on 16 Jan 2014 at 10:58PM.)  Ferritin ([facility]  6 (Result(s) reported on 16 Jan 2014 at 11:11PM.)  B-Type Natriuretic Peptide ([facility]  [phone](Result(s) reported on 16 Jan 2014 at 07:45PM.)  21-Dec-15 02:03   Creatinine (comp)  4.87  Potassium, Serum  6.6    09:12   Creatinine (comp)  5.09  Potassium, Serum  6.8    20:38   Creatinine (comp)  5.06  Potassium, Serum  5.8  22-Dec-15 03:58   Creatinine (comp)  5.03  Potassium, Serum 4.9    09:02   Creatinine (comp)  4.65  Potassium, Serum 5.0    16:43   Creatinine (comp)  3.91  Potassium, Serum  5.2  23-Dec-15 04:35   Glucose, Serum  133  BUN  48  Creatinine (comp)  3.21  Sodium, Serum 140  Potassium, Serum 4.5  Chloride, Serum  109  CO2, Serum 27  Calcium (Total), Serum  8.0  Anion Gap  4  Osmolality (calc) 294  eGFR (African American)  19  eGFR (Non-African American)  16 (eGFR values <631mmin/1.73 m2 may be an indication of chronic kidney disease (CKD). Calculated eGFR, using the MRDR Study equation, is useful in  patients with stable renal function. The eGFR calculation will not be reliable in acutely ill patients when serum creatinine is changing rapidly. It is not useful in patients on dialysis. The eGFR calculation may not be applicable to patients at the low and high extremes of body sizes, pregnant women, and vegetarians.)  Result Comment LABS - This specimen was collected through an   - indwelling catheter or arterial line.  - A minimum of 59m46mof blood was wasted prior    - to collecting the sample.  Interpret  - results with caution.  Result(s) reported on 19 Jan 2014 at 07:03AM.  24-Dec-15 07:55   Creatinine (comp)  1.85  Potassium, Serum 4.2  25-Dec-15 05:40   Creatinine (comp)  1.46  Potassium, Serum 4.1  26-Dec-15 04:12   Creatinine (comp) 1.19  Potassium, Serum 4.2  27-Dec-15 03:46   Glucose,  Serum  191  BUN 16  Creatinine (comp) 1.10  Sodium, Serum 141  Potassium, Serum 4.5  Chloride, Serum 107  CO2, Serum 30  Calcium (Total), Serum  7.9  Anion Gap  4  Osmolality (calc) 288  eGFR (African American) >60  eGFR (Non-African American)  55 (eGFR values <36m70mn/1.73 m2 may be an indication of chronic kidney disease (CKD). Calculated eGFR, using  the MRDR Study equation, is useful in  patients with stable renal function. The eGFR calculation will not be reliable in acutely ill patients when serum creatinine is changing rapidly. It is not useful in patients on dialysis. The eGFR calculation may not be applicable to patients at the low and high extremes of body sizes, pregnant women, and vegetarians.)  Result Comment - This specimen was collected through an   - indwelling catheter or arterial line.  - A minimum of 17ms of blood was wasted prior    - to collecting the sample.  Interpret  - results with caution.  Result(s) reported on 23 Jan 2014 at 04:13AM.  Magnesium, Serum  1.6 (1.8-2.4 THERAPEUTIC RANGE: 4-7 mg/dL TOXIC: > 10 mg/dL  -----------------------)  Phosphorus, Serum  2.2 (Result(s) reported on 23 Jan 2014 at 04:30AM.)  28-Dec-15 04:30   Glucose, Serum  210  BUN  22  Creatinine (comp) 1.14  Sodium, Serum 141  Potassium, Serum 4.7  Chloride, Serum 107  CO2, Serum 29  Calcium (Total), Serum  8.1  Anion Gap  5  Osmolality (calc) 291  eGFR (African American) >60  eGFR (Non-African American)  52 (eGFR values <638mmin/1.73 m2 may be an indication of chronic kidney disease (CKD). Calculated eGFR, using the MRDR Study equation, is useful in  patients with stable renal function. The eGFR calculation will not be reliable in acutely ill patients when serum creatinine is changing rapidly. It is not useful in patients on dialysis. The eGFR calculation may not be applicable to patients at the low and high extremes of body sizes, pregnant women, and vegetarians.)   Result Comment LABS - This specimen was collected through an   - indwelling catheter or arterial line.  - A minimum of 41m18mof blood was wasted prior    - to collecting the sample.  Interpret  - results with caution.  Result(s) reported on 24 Jan 2014 at 05:13AM.  Magnesium, Serum 2.0 (1.8-2.4 THERAPEUTIC RANGE: 4-7 mg/dL TOXIC: > 10 mg/dL  -----------------------)  Phosphorus, Serum  2.4 (Result(s) reported on 24 Jan 2014 at 05:13AM.)  Cardiac:  20-Dec-15 19:00   Troponin I 0.04 (0.00-0.05 0.05 ng/mL or less: NEGATIVE  Repeat testing in 3-6 hrs  if clinically indicated. >0.05 ng/mL: POTENTIAL  MYOCARDIAL INJURY. Repeat  testing in 3-6 hrs if  clinically indicated. NOTE: An increase or decrease  of 30% or more on serial  testing suggests a  clinically important change)  Routine UA:  20-Dec-15 21:03   Color (UA) Amber  Clarity (UA) Cloudy  Glucose (UA) Negative  Bilirubin (UA) Negative  Ketones (UA) Negative  Specific Gravity (UA) 1.025  Blood (UA) Negative  pH (UA) 5.0  Protein (UA) 100 mg/dL  Nitrite (UA) Negative  Leukocyte Esterase (UA) Trace (Result(s) reported on 16 Jan 2014 at 10:55PM.)  RBC (UA) NONE SEEN  WBC (UA) 28 /HPF  Bacteria (UA) TRACE  Epithelial Cells (UA) 5 /HPF  WBC Clump (UA) PRESENT  Hyaline Cast (UA) 10 /LPF (Result(s) reported on 16 Jan 2014 at 10:55PM.)  Routine Hem:  20-Dec-15 19:00   WBC (CBC) 7.5  RBC (CBC)  2.79  Hemoglobin (CBC)  6.8  Hematocrit (CBC)  23.4  Platelet Count (CBC)  119 (Result(s) reported on 16 Jan 2014 at 07:26PM.)  MCV 84  MCH  24.5  MCHC  29.2  RDW  17.6  23-Dec-15 04:35   WBC (CBC)  11.2  RBC (CBC)  2.72  Hemoglobin (CBC)  6.9  Hematocrit (CBC)  22.1  Platelet Count (CBC)  106  MCV 81  MCH  25.4  MCHC  31.3  RDW  17.4  Neutrophil % 41.0  Lymphocyte % 42.0  Monocyte % 11.4  Eosinophil % 4.8  Basophil % 0.8  Neutrophil # 4.6  Lymphocyte #  4.7  Monocyte #  1.3  Eosinophil # 0.5  Basophil # 0.1   27-Dec-15 03:46   WBC (CBC) 6.0  RBC (CBC)  2.62  Hemoglobin (CBC)  6.7  Hematocrit (CBC)  22.4  Platelet Count (CBC)  85  MCV 85  MCH  25.7  MCHC  30.1  RDW  19.1  Neutrophil % 55.7  Lymphocyte % 24.1  Monocyte % 13.6  Eosinophil % 6.1  Basophil % 0.5  Neutrophil # 3.3  Lymphocyte # 1.4  Monocyte # 0.8  Eosinophil # 0.4  Basophil # 0.0  28-Dec-15 04:30   WBC (CBC) 8.5  RBC (CBC)  2.94  Hemoglobin (CBC)  7.8  Hematocrit (CBC)  24.8  Platelet Count (CBC)  102  MCV 85  MCH 26.6  MCHC  31.5  RDW  18.5  Neutrophil % 60.7  Lymphocyte % 22.2  Monocyte % 11.1  Eosinophil % 5.5  Basophil % 0.5  Neutrophil # 5.1  Lymphocyte # 1.9  Monocyte # 0.9  Eosinophil # 0.5  Basophil # 0.0 (Result(s) reported on 24 Jan 2014 at 04:52AM.)   PERTINENT RADIOLOGY STUDIES: XRay:    20-Dec-15 19:25, Chest Portable Single View  Chest Portable Single View   REASON FOR EXAM:    chest pain  COMMENTS:       PROCEDURE: DXR - DXR PORTABLE CHEST SINGLE VIEW  - Jan 16 2014  7:25PM     CLINICAL DATA:  Chest pain for several hours    EXAM:  PORTABLE CHEST - 1 VIEW    COMPARISON:  09/28/2013    FINDINGS:  Left-sided pacemaker overlies large cardiac silhouette. Lung bases  are poorly evaluated. Potential bilateral pleural effusions. Upper  lungs appear clear.     IMPRESSION:  Cardiomegaly and potential bilateral effusions. Lung bases poorly  evaluated.      Electronically Signed    By: Suzy Bouchard M.D.    On: 01/16/2014 19:43         Verified By: Rennis Golden, M.D.,    21-Dec-15 13:18, Chest Portable Single View  Chest Portable Single View   REASON FOR EXAM:    verify right IJ central line placement  COMMENTS:       PROCEDURE: DXR - DXR PORTABLE CHEST SINGLE VIEW  - Jan 17 2014  1:18PM     CLINICAL DATA:  Right-sided at central venous line placement    EXAM:  PORTABLE CHEST - 1 VIEW    COMPARISON:  CT 01/17/2014    FINDINGS:  Interval placement of a right IJ  line with tip the distal SVC. NG  endotracheal tube and NG tube are unchanged. Stable enlarged cardiac  silhouette. There are bilateral pleural effusions. No pneumothorax  identified.     IMPRESSION:  1. Placement of right central venous line with no pneumothorax  identified.  2. Cardiomegaly and bilateral pleural effusions unchanged.      Electronically Signed    By: Suzy Bouchard M.D.    On: 01/17/2014 13:47         Verified By: Rennis Golden, M.D.,    26-Dec-15 09:22, Chest Portable Single View  Chest Portable Single View   REASON FOR EXAM:  ventilator, fever- look for pneumonia.  COMMENTS:       PROCEDURE: DXR - DXR PORTABLE CHEST SINGLE VIEW  - Jan 22 2014  9:22AM     CLINICAL DATA:  Fever    EXAM:  PORTABLE CHEST - 1 VIEW    COMPARISON:  01/17/2014    FINDINGS:  Left neck dialysis catheter removed. Right jugular PICC,  endotracheal tube, NG tube, pacemaker device stable. Airspace  disease throughout the right lung and pleural effusion are worse.  There is haziness at the left base and low lung volumes. No  pneumothorax.     IMPRESSION:  Worsening right lung consolidation and right pleural effusion.    Temporary dialysis catheter removed      Electronically Signed    By: Maryclare Bean M.D.    On: 01/22/2014 09:53       Verified By: Jamas Lav, M.D.,    28-Dec-15 06:26, Chest Portable Single View  Chest Portable Single View   REASON FOR EXAM:    respiratory failure  COMMENTS:       PROCEDURE: DXR - DXR PORTABLE CHEST SINGLE VIEW  - Jan 24 2014  6:26AM     CLINICAL DATA:  Respiratory failure.    EXAM:  PORTABLE CHEST - 1 VIEW    COMPARISON:  01/22/2014.    FINDINGS:  Endotracheal tube, right IJ line, left subclavian line in stable  position. Stable cardiomegaly. Prior cardiac pacer. Bilateral  pulmonary alveolar infiltrates with right pleural effusion. No  pneumothorax. No acute bony abnormality.     IMPRESSION:  1. Lines and tubes in  stable position.  2. Persistent bilateral pulmonary alveolar infiltrates with small  right pleural effusion.  3. Prior median sternotomy. Cardiac pacer. Cardiomegaly. Pulmonary  vascularity normal.      Electronically Signed    By: Marcello Moores  Register    On: 01/24/2014 07:56     Verified By: Osa Craver, M.D., MD  [place]:    20-Dec-15 23:15, US Kidney Bilateral  US Kidney Bilateral   REASON FOR EXAM:    arf  COMMENTS:       PROCEDURE: [place]  - US KIDNEY  - Jan 16 2014 11:15PM     CLINICAL DATA:  Acute renal failure    EXAM:  RENAL/URINARY TRACT ULTRASOUND COMPLETE    COMPARISON:  04/28/2013    FINDINGS:  Right Kidney:  Length: 10 cm. Echogenicity within normal limits. No mass or  hydronephrosis visualized.    Left Kidney:    Length: 10.6 cm. Echogenicity within normal limits. No mass or  hydronephrosis visualized.    Bladder:    Not visualized.  Presumably decompressed.    Exam detail is suboptimal due to patient body habitus.     IMPRESSION:  Normal exam.      Electronically Signed    By: Conchita Paris M.D.    On: 01/16/2014 23:48         Verified By: Arline Asp, M.D.,    27-Dec-15 15:15, [place] Color Flow Doppler Low Extrem Bilat (Legs)  [place] Color Flow Doppler Low Extrem Bilat (Legs)   REASON FOR EXAM:    look for DVT, Hypoxia, can not do CT chest due to   obasity.  COMMENTS:       PROCEDURE: [place]  - US DOPPLER LOW EXTR BILATERAL  - Jan 23 2014  3:15PM     CLINICAL DATA:  Hypoxia, shortness of breath, lower extremity edema,  morbid obesity    EXAM:  BILATERAL LOWER EXTREMITY VENOUS DOPPLER ULTRASOUND    TECHNIQUE:  Gray-scale sonography with graded compression, as well as color  Doppler and duplex ultrasound were performed to evaluate the lower  extremity deep venous systems fromthe level of the common femoral  vein and including the common femoral, femoral, profunda femoral,  popliteal and calf veins including the posterior tibial,  peroneal  and gastrocnemius veins when visible. The superficial great  saphenous vein was also interrogated. Spectral Doppler was utilized  to evaluate flow at rest and with distal augmentation maneuvers in  the common femoral, femoral and popliteal veins.    COMPARISON:  None.    FINDINGS:  Very limited exam because of obese body habitus.    RIGHT LOWER EXTREMITY  Common Femoral Vein: No evidence of thrombus. Normal  compressibility, respiratory phasicity and response to augmentation.    Saphenofemoral Junction: No evidence of thrombus. Normal  compressibility and flow on color Doppler imaging.    Profunda Femoral Vein: No evidence of thrombus. Normal  compressibility and flow on color Doppler imaging.    Femoral Vein: No evidence of thrombus. Normal compressibility,  respiratory phasicity and response to augmentation.    Popliteal Vein: No evidence of thrombus. Normal compressibility,  respiratory phasicity and response to augmentation.  Calf Veins: No evidence of thrombus. Normal compressibility and flow  on color Doppler imaging.    Superficial Great Saphenous Vein: No evidence of thrombus. Normal  compressibility and flow on color Doppler imaging.    Venous Reflux:  None.    Other Findings:  None.    LEFT LOWER EXTREMITY    Common Femoral Vein: No evidence of thrombus. Normal  compressibility, respiratory phasicity and response to augmentation.  Saphenofemoral Junction: No evidence of thrombus. Normal  compressibility and flow on color Doppler imaging.    Profunda Femoral Vein: No evidence of thrombus. Normal  compressibility and flow on color Doppler imaging.    Femoral Vein: No evidence of thrombus. Normal compressibility,  respiratory phasicity and response to augmentation.    Popliteal Vein: No evidence of thrombus. Normal compressibility,  respiratory phasicity and response to augmentation.    Calf Veins: No evidence of thrombus. Normal compressibility  and flow  on color Doppler imaging.  Superficial Great Saphenous Vein: No evidence of thrombus. Normal  compressibility and flow on color Doppler imaging.    Venous Reflux:  None.    Other Findings:  None.     IMPRESSION:  Very limited exam because of body habitus. No large volume occlusive  DVT detected in either extremity.      Electronically Signed    By: Daryll Brod M.D.    On: 01/23/2014 15:30     Verified By: Earl Gala, M.D.,  [place]:    20-Dec-15 23:15, US Kidney Bilateral  PACS Image     21-Dec-15 13:18, Chest Portable Single View  PACS Image     26-Dec-15 09:22, Chest Portable Single View  PACS Image     27-Dec-15 15:15, [place] Color Flow Doppler Low Extrem Bilat (Legs)  PACS Image     28-Dec-15 06:26, Chest Portable Single View  PACS Image    Pertinent Past History:  Pertinent Past History 1.  Bioprosthetic aortic valve replacement.  2.  Diabetes mellitus.  3.  Morbid obesity.  4.  Obstructive sleep apnea.  5.  Permanent pacemaker.  6.  Asthma. 7.  History of septic shock needing CRRT briefly in September 2015.  8.  Thrombocytopenia.   SOCIAL HISTORY: The patient  ambulates with a cane, does not use any home oxygen, lives with her parents, does not smoke. No alcohol. No illicit drug use.   Hospital Course:  Hospital Course *  Acute Hypoxic Hypercarbic Resp. Failure - etiology unclear but likely related to obesity pickwickian syndrome/COPD - intubated 12/23 and remains on vent at 30% FiO2.  ABG showing improved hypercarbia but still have hypoxia.  - Xray- Pleural effusion, could not get CT chest due to body habitus, Doppler lower extrimity negative. - wean off sedation and attempt to wean off vent if possible - Pulm. following. failed weaning again. May need LTAC.  * AMS - likely severe metabolic encephalopathy from renal failure and hypercarbic resp. failure.  - currently intubated and sedated and will follow mental status once off sedation and  off vent.   * Fever   Checked Xray chest(Effusion), and stool for c diff (negative).  * Acute renal failure - etiology unclear but suspected to be due hypotension, volume loss from GI bleed.  - likely ATN.  Renal US showing no hydronephrosis.  - was going to be started on CRRT and has vasc cath placed by Vascular but it was clotted off and CRRT could not be initiated.  - pt. is making Urine now , Cr. improved.  No need for CRRT or dialysis at this time.  Appreicate Nephro input.   * Hyperkalemia - likely due to renal failure.  - resolved now.    * Positive blood culture one of two- with Gram positive cocci- given vancomycin 01/20/14, but stopped as it is reported to be contamination.  *  Anemia - acute on chronic anemia.  pt. is heme + and likely needs a GI workup.  - Transfused 2 unit PRBC. - had Endosocpy in 2013 showing gastritis.  No hx of colonoscopy but given her morbid obesity this maybe tough.  - GI following and plan to do endoscopy when resp. status improved.  cont. pepcid for now.  - Hb stable for now.  * DM - cont. insulin SS * Hypotension - likely sedation mediated.  - cont. Levophed, fluids and follow BP. Keep MAP > 60.  Tapering levophed slowly- off now. - no evidence of sepsis.  Echo showing normal EF w/ no evidence of Cardiomyopathy. * Hypothyroidsim - cont. synthroid.   Condition on Discharge Guarded   Code Status:  Code Status Full Code   DISCHARGE INSTRUCTIONS HOME MEDS:  Medication Reconciliation: Patient's Home Medications at Discharge:     Medication Instructions  gabapentin 600 mg oral tablet  1 tab(s) orally 3 times a day   trazodone 50 mg oral tablet  1 tab(s) orally once a day (at bedtime)   citalopram 10 mg oral tablet  1 tab(s) orally once a day   fentanyl  50 mcg/hr intravenous now- continuous per vent management protocol.   morphine  2 milligram(s) injectable every 4 hours, As needed, severe pain (7-10/10)   insulin glargine 100 units/ml  subcutaneous solution  10 unit(s) subcutaneous once a day   chlorhexidine topical  5 milliliter(s) orally every 12 hours   lorazepam  2 milligram(s) injectable every hour, As needed, agitation   albuterol-ipratropium 2.5 mg-0.5 mg/3 ml inhalation solution  3 milliliter(s) inhaled 4 times a day   furosemide  20 milligram(s) injectable every 12 hours   pantoprazole  40 milligram(s) orally every 12 hours- via NG tube   budesonide 0.5 mg/2 ml inhalation suspension  2 milliliter(s) inhaled 2 times a day   levothyroxine 100  mcg (0.1 mg) oral tablet  1 tab(s) orally once a day   norepinephrine  1 mcg/kg/hr intravenous now- continuous to keep MAP >65     Physician's Instructions:  Diet Regular   Activity Limitations None   Return to Work Not Applicable   Time frame for Follow Up Appointment 1-2 days   Other Comments transferred to [facility].     BURNS, HARRIET(Family Physician): [phone]  TIME SPENT:  Total Time: Greater than 30 minutes   Electronic Signatures: Vaughan Basta (MD)  (Signed 28-Dec-15 17:24)  Authored: ADMISSION DATE AND DIAGNOSIS, CHIEF COMPLAINT/HPI, Allergies, PERTINENT LABS, PERTINENT RADIOLOGY STUDIES, PERTINENT PAST HISTORY, HOSPITAL COURSE, DISCHARGE INSTRUCTIONS HOME MEDS, PATIENT INSTRUCTIONS, Follow Up Physician, TIME SPENT   Last Updated: 28-Dec-15 17:24 by Vaughan Basta (MD)

## 2014-05-21 NOTE — Op Note (Signed)
PATIENT NAME:  Theresa Leon, Theresa Leon MR#:  [id number] DATE OF BIRTH:  September 23, 1955  DATE OF PROCEDURE:  09/29/2013  PREOPERATIVE DIAGNOSES: 1.  Sepsis. 2.  Fournier gangrene. 3.  Acute renal failure.  4.  Congestive heart failure and volume overload.   POSTOPERATIVE DIAGNOSES:  1.  Sepsis. 2.  Fournier gangrene. 3.  Acute renal failure.  4.  Congestive heart failure and volume overload.   PROCEDURE PERFORMED: Insertion of single lumen central line, left internal jugular, with ultrasound guidance.   SURGEON: Hortencia Pilar, MD  DESCRIPTION OF PROCEDURE: The patient is in the intensive care unit. She is critically ill. We need to establish a line so that she can receive CRT as well as maintain her parenteral medications. Right neck has a central line ready. Left neck is prepped and draped in a sterile fashion. Ultrasound is placed in a sterile sleeve. Jugular vein is identified. It is echolucent and compressible indicating patency, image is recorded for the permanent record and under real-time visualization a Seldinger needle is inserted. J-wire is advanced but will not advance past the 20 cm mark. Multiple attempts including re-puncture with a micropuncture kit, microwire and Amplatz wire do not result in adequate purchase for placement of a dialysis catheter nor is there adequate wire support for a triple lumen. Therefore, a microsheath is inserted and sutured to the neck. She will undergo replacement of central venous access in special procedures.   ____________________________ Katha Cabal, MD ggs:sb D: 10/01/2013 12:43:42 ET T: 10/01/2013 13:15:18 ET JOB#: [id number]  cc: Katha Cabal, MD, <Dictator> Katha Cabal MD ELECTRONICALLY SIGNED 10/05/2013 22:40

## 2014-05-21 NOTE — H&P (Signed)
PATIENT NAME:  Theresa Leon, Theresa Leon MR#:  [id number] DATE OF BIRTH:  1955-08-21  DATE OF ADMISSION:  04/28/2013  REFERRING PHYSICIAN: Dr. Hinda Kehr.   PRIMARY CARE PHYSICIAN: Dr. Kingsley Spittle.   PRIMARY CARDIOLOGIST: Dr. Neoma Laming.   CHIEF COMPLAINT: Chest pain.    HISTORY OF PRESENT ILLNESS: This is a 59 year old female with known history of morbid obesity, obstructive sleep apnea, history of valvular heart disease status post valve placement, history of atrial fibrillation status post pacemaker, multiple syncopal episodes. The patient presents with complaints of chest discomfort. The patient reports it started this evening. Had 1 episode at rest. Reports initially it was left-sided. Then, it happened again. Started on the left side and radiated to the right side of her chest. Had some mild nausea with it. As well has been complaining of some shortness of breath. The patient denies any palpitation, any diaphoresis. As well, the patient reports she had a syncopal episode. The patient reports she has frequent syncopal episodes. Reports she had 2 episodes today. The patient's troponin was negative. Her EKG was paced. By reviewing the patient's records, she had a cardiac cath done by Dr. Neoma Laming in December 2013 which did not show any coronary lesions, with normal ejection fraction. The patient was noticed to be hypotensive at one point. Systolic blood pressure dropped to the 70s. As well, the patient was noticed to be in acute renal failure with a creatinine of 3.22 which is new to her. Her most recent creatinine was 1.4. The patient reports she has been recently started on p.o. Lasix due to complaints of shortness of breath and lower extremity edema. Reports her edema has resolved and her shortness of breath has improved after the Lasix. The patient had bladder scan done in the ED which did show 285 mL postvoid. After Foley insertion, she had 350 mL urine output, but this was after she received 2.5  liter fluid bolus. Hospitalist service was requested to admit the patient for further evaluation.   PAST MEDICAL HISTORY:  1. Asthma.  2. Sleep apnea, on CPAP.  3. Pseudogout.  4. Migraine.  5. Aortic stenosis.  6. Gastroesophageal reflux disease.  7. Hypothyroidism.  8. Valvular heart disease, status post fall bovine replacement.  9. Atrial fibrillation.  10. Morbid obesity.  11. Frequent syncopal episodes.  12. Pacemaker insertion for arrhythmia. The patient is unclear what kind of arrhythmia she had.   PAST SURGICAL HISTORY:  1. Tonsillectomy.  2. Wisdom teeth removed.  3. Cholecystectomy.  4. Tubal ligation.  5. Pacemaker in 2009.  6. Valve replacement in 2008.    SOCIAL HISTORY: No alcohol. No drug use. She quit smoking in 2012.   FAMILY HISTORY: Significant for diabetes and hypertension in the family and aortic stenosis in her mother.   ALLERGIES:  1. NITRATES.  2. TETANUS TOXOID.  3. ANIMAL DANDER, CAT.  4. DUST.  5. TAPE.    HOME MEDICATIONS:  1. Naproxen 500 mg oral 2 times a day.  2. diclofenac 75 mg oral 2 times a day.  3. Acetaminophen/hydrocodone 325/5 mg 1 tablet oral daily.  4. Sotalol 80 mg oral 2 times a day.  5. Gabapentin 600 mg oral 3 times a day.  6. Celexa 40 mg oral daily.  7. Trazodone 50 mg oral daily.  8. Metformin 1000 mg oral daily.  9. Meclizine 25 mg oral every 6 hours as needed.  10. Levocetirizine 5 mg oral daily.  11. Loratadine 10 mg oral daily.  12. EpiPen kit.  13. Singulair 10 mg oral daily.  14. Synthroid 100 mcg oral daily.  15. Aspirin 81 mg oral daily.   REVIEW OF SYSTEMS:  CONSTITUTIONAL: The patient denies fever, chills. Complains of fatigue, weakness.  EYES: Denies blurry vision, double vision, inflammation, glaucoma.  ENT: Denies tinnitus, ear pain, hearing loss, epistaxis or discharge.  RESPIRATORY: Denies any cough, wheezing or hemoptysis. Reports baseline shortness of breath, mainly orthopnea. Currently using 2  pillows at bedtime. Improved with Lasix. Denies history of COPD.  CARDIOVASCULAR: Reports chest pressure, reproducible by palpation. Has had edema but improved after Lasix. Currently resolved. Denies any palpitation. Reports having syncope today, but she is having frequently.  GASTROINTESTINAL: Denies nausea, vomiting, diarrhea, abdominal pain, hematemesis or melena.  GENITOURINARY: Denies dysuria, hematuria or renal colic.  ENDOCRINE: Denies polyuria, polydipsia, heat or cold intolerance.  HEMATOLOGY: Denies anemia, easy bruising, bleeding diathesis.  INTEGUMENTARY: Denies acne, rash or skin lesions.  MUSCULOSKELETAL: Denies any cramps, arthritis or redness.  NEUROLOGIC: Denies history of CVA, TIA, headache, ataxia, vertigo, tremor.  PSYCHIATRIC: Denies anxiety, insomnia or depression.   PHYSICAL EXAMINATION:  VITAL SIGNS: Temperature 98.4, pulse 70, respiratory rate 18, blood pressure 108/54, satting 98% on oxygen. Blood pressure has been as low as 72/43.  GENERAL: Morbidly obese female, looks comfortable in bed, in no apparent distress.  HEENT: Head atraumatic, normocephalic. 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 Has mild  miosis once compared to the right side. Pink conjunctivae. Anicteric sclerae. Moist oral mucosa.  NECK: Supple. No thyromegaly. No JVD. No carotid bruits.  CHEST: Good air entry bilaterally. No wheezing, rales or rhonchi.  CARDIOVASCULAR: S1, S2 heard. Irregularly irregular. Has murmur. No rubs. No gallops.  ABDOMEN: Morbidly obese, soft, nontender, nondistended. Bowel sounds present.  EXTREMITIES: No edema. No clubbing. No cyanosis. Pedal and radial pulses +2 bilaterally.  PSYCHIATRIC: Pleasant, awake, alert x 3. Intact judgment and insight.  LYMPHATICS: No cervical lymphadenopathy could be appreciated.  SKIN: Normal skin turgor. Warm and dry.  MUSCULOSKELETAL: No joint effusion or erythema could be appreciated.   PERTINENT LABORATORIES: BNP 314, glucose 176,  BUN 33, creatinine 3.22, sodium 134, potassium 3.9, chloride 97. Troponin less than 0.02. White blood cells 9.7, hemoglobin 11.6, hematocrit 35, platelets 125. Urinalysis +1 leukocyte esterase and 9 white blood cells.   Portable chest x-ray showing stable postoperative finding. Cardiomegaly with vascular congestion.   EKG showing paced rhythm at 62 beats per minute.   ASSESSMENT AND PLAN:  1. Chest pain: It appears to be atypical. Musculoskeletal quality. It was reproducible by palpation on my physical exam. The patient's first troponin is negative. Had cardiac catheterization done in 2013 without any coronary artery disease. The patient will be admitted to telemetry. Will cycle her cardiac enzymes. Follow the trend. Meanwhile, she will be given 1 dose of 324 mg of aspirin.  2. Acute renal failure: This appears to be most likely related to volume depletion as she was recently started on Lasix. and hypotension, Most likely, it is acute tubular necrosis. Will consult nephrology for evaluation. Will continue with intravenous fluids. Has Foley catheter inserted. Only 350 of urine output after the Foley insertion but this was after the patient receiving 2.5 liters. will hold NSAIDS as well. 3. Hypotension: Etiology unclear but the patient reports usually blood pressure at home is on the lower side. Her baseline she reports systolic between 80 and 314. The patient appears to be asymptomatic at this point, so this is most likely volume  depletion versus cardiac origin. Will check echocardiogram to evaluate her systolic function or any valvular heart disease. Will hydrate the patient. As well, she will be started on low-dose dopamine drip. Will consult cardiology to evaluate for any cardiac origin of her hypotension.  4. Diabetes mellitus: Will hold metformin given her renal failure. Will have her on insulin sliding scale.  5. History of atrial fibrillation: This is unclear if she has atrial fibrillation or not as  she is currently in normal sinus rhythm on telemetry. She is on aspirin. Rate controlled. Will consult cardiology with Dr. Neoma Laming who is the patient's primary cardiologist and he is familiar with her.  6. Obstructive sleep apnea: Continue with CPAP at bedtime.  7. Syncope: The patient reports multiple episodes of syncope. The patient's hypotension certainly contributes to the syncopal episodes. Will hydrate. Will monitor on telemetry. Will cycle cardiac enzymes.  8. Morbid obesity: The patient was counseled about the importance of weight loss.  9. Urinary tract infection: Start on Rocephin.  10. Hypothyroidism: Continue with Synthroid.  11. Deep vein thrombosis prophylaxis: Subcutaneous heparin.  12. Gastrointestinal prophylaxis: On proton pump inhibitor.   CODE STATUS: The patient is FULL CODE.   TOTAL TIME SPENT ON ADMISSION AND PATIENT CARE: 55 minutes.   ____________________________ Albertine Patricia, MD dse:gb D: 04/28/2013 01:09:41 ET T: 04/28/2013 01:40:31 ET JOB#: [id number]  cc: Albertine Patricia, MD, <Dictator> DAWOOD Graciela Husbands MD ELECTRONICALLY SIGNED 04/28/2013 2:53

## 2014-05-21 NOTE — Discharge Summary (Signed)
PATIENT NAME:  Theresa Leon, Theresa Leon MR#:  [id number] DATE OF BIRTH:  1956-01-05  DATE OF ADMISSION:  07/13/2013 DATE OF DISCHARGE:  07/15/2013  ADMITTING DIAGNOSIS: Syncope.   DISCHARGE DIAGNOSES:  1. Syncope.  2. Hypotension.   3. Acute renal failure. 4. Dehydration.  5. Hyperkalemia due to acute renal failure, resolved.  6. Diabetes mellitus with hemoglobin A1c 8.3.  7. Asthma.  8. Obstructive sleep apnea on CPAP at home.  9. Pseudogout.  10. Migraine headaches.  11. Valvular heart disease, status post aortic bovine valve replacement.  12. Chronic atrial fibrillation.  13. Morbid obesity.  14. Hypothyroidism.  15. Gastroesophageal reflux disease.  16. Thrombocytopenia of unclear etiology.   DISCHARGE CONDITION: Stable.   DISCHARGE MEDICATIONS: The patient is to continue ranitidine 150 mg twice daily, Singulair 10 mg p.o. at bedtime, Synthroid 100 mcg p.o. daily, gabapentin 300  mg p.o. 3 times daily, trazodone 50 mg p.o. at bedtime, metformin extended release 500  mg p.o. daily, acetaminophen hydrocodone 325/5 mg 1 tablet every 6 hours as needed, meclizine 25 mg every 6 hours as needed, citalopram 10 mg p.o. daily, Xyzal 5 mg p.o. daily in the evening, glipizide XL 2.5 mg p.o. daily. Glipizide is new medication.   The patient is not to take diclofenac or Lasix or lisinopril unless recommended by primary care physician.   HOME OXYGEN: None.   DIET: 2 grams salt, low-fat, low-cholesterol, carbohydrate -controlled diet, regular consistency.   ACTIVITY LIMITATIONS: As tolerated.    FOLLOWUP APPOINTMENT: With Dr. Quay Burow in 2 days after discharge.    CONSULTANTS: Care management, social work.   RADIOLOGIC STUDIES: Chest x-ray, portable single view, 07/13/2013, revealed stable postoperative chest with mild cardiomegaly and chronic vascular congestion. CT scan of head without contrast, 07/13/2013, revealed slight periventricular small vessel disease. No intracranial mass, hemorrhage or  acute-appearing infarct. Carotid ultrasound 06/17/ 2015, revealing mild bilateral carotid bifurcation plaque resulting in less than 50% diameter stenosis.  The exam does not exclude plaque ulceration or embolization.  Continued surveillance was recommended.   HOSPITAL COURSE: The patient is a 59 year old Caucasian female with past medical history significant for history of multiple medical problems including diabetes, hypertension, hyperlipidemia, obesity, also history of syncope in the past who presents to the hospital with complaints of near syncopal episodes as well as syncopal episodes on the day of admission. Please refer to Dr. Gus Height Patel's admission note on 07/13/2013. On arrival to the hospital, patient was afebrile. Pulse was 60, blood pressure was 75/45. Repeated blood pressure was 64/45. O2 saturations were 99% on 2 liters of oxygen through nasal cannula.   PHYSICAL EXAM: Unremarkable.   LABORATORY DATA: Done on arrival to the hospital, 07/13/2013, revealed BUN as well as creatinine of 47 and 3.44, glucose 151, potassium 5.4, bicarbonate level was 26, estimated GFR  would be 14. Calcium level was 8.3, albumin level was 3.3, otherwise liver enzymes were normal. Cardiac enzymes x 3 were within normal limits. White blood cell count was normal at 8.9, hemoglobin was 10.5, platelet count 111,000. Coagulation panel was unremarkable. EKG showed incomplete right bundle branch block at 60 beats per minute, left anterior fascicular block, and nonspecific ST-T changes. The patient was admitted to the hospital for further evaluation. She was initiated on IV fluids because of her acute renal failure and cardiac enzymes were cycled first in regard to syncope. It was felt that the patient's syncope was related to significant hypotension.  Patient's blood pressure medications as well as diuretics were stopped  and patient was rehydrated and her blood pressure improved. The patient had carotid ultrasound done, which  was unremarkable. Echocardiogram was not repeated as it was done in April and decision was made not to repeat it. In regard to hypotension, the patient's blood pressure improved with stopping her blood pressure medications and dehydration. In regard to acute renal failure, it was felt to be due to dehydration and patient's kidney function normalized with the hydration. On 07/15/2013,  patient's BUN and creatinine were 16 and 1.11. The patient was advised not to take ACE inhibitor or Lasix and follow up with her primary care physician for further recommendations. It was felt that the patient's dehydration could have been related to diarrhea which patient experienced but also poorly controlled diabetes. The patient's hemoglobin A1c was checked and was found to be 8.3. Patient was advised to follow up with primary care physician for further management and tighter management of her diabetes, blood glucose levels. The patient was initiated on glipizide in addition to her usual dose of metformin which she is to resume. She was advised to continue to follow up with strict diabetic diet. In regard to hypotension, unfortunately, the patient's blood pressure did not improve to above 336 systolic. It remained low 100s, from 12-244 systolic and 97N to 30Y diastolic. It was felt that it would not be safe for this patient to be restarted on ACE inhibitor at this time and patient was advised to follow up with her primary care physician for recommendations in regard to restarting ACE inhibitor doses.  Patient was noted to be thrombocytopenic while she was in the hospital.  Platelet  count was noted to be low on arrival to the hospital.  However, it worsened even lower on the day of discharge. It is recommended to follow patient's platelet count very closely and send patient to oncologist if needed for further investigation.  Patient had a CBC checked while she was in the hospital which did not show any schistocytes. The patient's  coagulation panel was also normal, and since her kidney function improved, as well as her overall condition, it was felt that the probability for HUS or PDP  was very low. In regard to chronic medical problems, including pseudogout, migraine headaches, obesity, hypothyroidism, gastroesophageal reflux disease, patient was advised to continue her outpatient medications. No changes were made. The patient was discharged in stable condition with above-mentioned medications and followup. She was recommended to follow up with her primary physician, Dr. Quay Burow, in the next few days after discharge. On the day of discharge, patient's vital signs, temperature was 99.8, pulse was 64, respiration rate was 18, blood pressure ranging from 95 systolic to 511 and 02T to 11N diastolic. Oxygen saturations were 96% to 97% on 2 liters of oxygen through nasal cannula at rest.   TIME SPENT:  40 minutes.     ____________________________ Theodoro Grist, MD rv:dd/am D: 07/15/2013 19:37:23 ET T: 07/16/2013 05:34:34 ET JOB#: [id number]  cc: Theodoro Grist, MD, <Dictator> Dr. Avel Sensor MD ELECTRONICALLY SIGNED 07/24/2013 18:48

## 2014-05-21 NOTE — Consult Note (Signed)
Brief Consult Note: Diagnosis: sepsis, morbid obesity, acute kidney injury and probable right perianal necrotizing soft tissue infection.   Patient was seen by consultant.   Recommend to proceed with surgery or procedure.   Discussed with Attending MD.   Comments: underwhemling external exam but ct scan scan shows a substantial amount of deep soft tissue air concerning for necrotizing soft tissue infection.Needs urgent debridement.  Electronic Signatures: Sherri Rad (MD)  (Signed 02-Sep-15 02:34)  Authored: Brief Consult Note   Last Updated: 02-Sep-15 02:34 by Sherri Rad (MD)

## 2014-05-21 NOTE — Consult Note (Signed)
PATIENT NAME:  Theresa Leon, Theresa Leon MR#:  [id number] DATE OF BIRTH:  01/27/1956  NEPHROLOGY CONSULTATION   DATE OF CONSULTATION:  04/28/2013  REFERRING PHYSICIAN:  Albertine Patricia, MD CONSULTING PHYSICIAN:  Pinki Rottman Lilian Kapur, MD  REASON FOR CONSULTATION: Acute renal failure.   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 She reports that she finished eating dinner and developed chest pain on the left side of her chest. This radiated to her left axilla as well as into posterior neck. It lasted approximately 30 minutes. She describes the pain as a pressure-like sensation. She was admitted for further evaluation of this. We are now consulted for the evaluation and management of acute renal failure. Baseline creatinine appears to be 1.1 from 07/05/2011. Creatinine upon admission was 3.22. The patient was noted to be hypotensive upon admission. Low blood sodium was also noted with a serum sodium of 134. She was also noted to be on NSAIDs at home. She denies nausea, vomiting or diarrhea at home. Hypomagnesemia was also noted this a.m. The patient has been started on IV fluid hydration with 0.9 normal saline.   PAST MEDICAL HISTORY:  1. Morbid obesity.  2. Frequent syncopal events.  3. Asthma.  4. Sleep apnea, on CPAP.  5. Pseudogout.  6. Migraine headaches.  7. Aortic stenosis.  8. GERD.  9. Hypothyroidism.  10. 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   PAST SURGICAL HISTORY:  1. Tonsillectomy.  2. Cholecystectomy. 3. Tubal ligation.  4.  Pacemaker placement in 2009.  5. Valve replacement which is bovine in nature.   ALLERGIES: NITRATES, TETANUS TOXOID, CAT DANDER, DUST AND TAPE.   CURRENT INPATIENT MEDICATIONS: Include:  1. Dopamine drip.  2. 0.9 normal saline at 75 mL/hour.  3. Colace 100 mg p.o. b.i.d.  4. Heparin 5000 units subcutaneous q.8 hours.  5. Sliding scale insulin.  6. Synthroid 0.1 mg p.o. at 6:00 a.m.  7. Magnesium sulfate 2 grams IV x1.  8. Montelukast 10 mg p.o. daily.  9. Zofran 4 mg IV q.4 hours p.r.n. nausea.  10. Senna 1 tablet p.o. b.i.d.  11. Aspirin 81 mg daily.  12. Ceftriaxone 1 gram IV q.24 hours.   SOCIAL HISTORY: The patient resides in [place]. She is widowed. She has 3 children. She denies tobacco use currently, but quit smoking in 2012. She denies alcohol or illicit drug use.   FAMILY HISTORY: The patient's mother is alive and has history of hypertension. The patient's father is alive and has history of diabetes mellitus.   REVIEW OF SYSTEMS:  CONSTITUTIONAL: Denies fevers, chills, weight loss.  EYES: Denies diplopia or blurry vision. Reported some loss of vision in the left eye prior to her chest pain, but vision now is normal.  HEENT: Has migraine headaches. Denies hearing loss, epistaxis, sore throat.  CARDIOVASCULAR: Endorses chest pain which radiated into her left arm and neck.  RESPIRATORY: Endorses shortness of breath, but denies cough or hemoptysis.  GASTROINTESTINAL: Denies nausea, vomiting, diarrhea or bloody stools.  GENITOURINARY: Denies frequency, urgency or dysuria.  MUSCULOSKELETAL: Denies joint pain, swelling or redness.  INTEGUMENTARY: Denies skin rashes or lesions.  NEUROLOGIC: Denies focal  weakness or numbness. Has history of frequent syncopal events.  PSYCHIATRIC: Denies depression, bipolar disorder.  ENDOCRINE: Denies polyuria or polydipsia. Has history of diabetes mellitus.  HEMATOLOGIC AND LYMPHATIC: Denies easy bruisability, bleeding or swollen lymph nodes.   ALLERGY AND IMMUNOLOGIC: Endorses seasonal allergies. Denies history of immunodeficiency.   PHYSICAL EXAMINATION:  VITAL SIGNS: Temperature 98.4, pulse 76, respirations 16, blood pressure 95/48, pulse oximetry 96% on 4 liters.  GENERAL: Reveals a morbidly obese female, currently in no acute distress.  HEENT: Normocephalic, atraumatic. Extraocular movements are intact. Pupils are equal, round and reactive to light. No scleral icterus. Conjunctivae are pale. No epistaxis noted. Gross hearing intact. Oral mucosa moist.  NECK: Supple, without JVD or lymphadenopathy.  LUNGS: Clear to auscultation bilaterally with normal respiratory effort.  CARDIOVASCULAR: S1, S2, regular rate and rhythm. No murmurs, rubs or gallops appreciated.  ABDOMEN: Obese, soft, nontender, nondistended. Bowel sounds positive. No rebound or guarding. No gross organomegaly appreciated.  EXTREMITIES: No clubbing or cyanosis noted. Trace bilateral lower extremity edema noted.  NEUROLOGIC: The patient is awake, alert and oriented to time, person and place. Strength is 5 out of 5 in both upper and lower extremities.  SKIN: Warm and dry. No rashes noted.  MUSCULOSKELETAL: No joint redness, swelling or tenderness appreciated.  GENITOURINARY: No suprapubic tenderness is noted at this time.  PSYCHIATRIC: The patient with appropriate affect and appears to have good insight into her current illness.  LABORATORY DATA: Magnesium 1.1, glucose 210. Urinalysis shows urine protein 30 mg/dL, no RBCs per high-power field, 9 WBCs per high-power field. Chest x-ray showed a left subclavian pacemaker. There was cardiomegaly with vascular congestion. CBC shows WBC is 9.7, hemoglobin 11.6, hematocrit 35, platelets 125. BMP shows sodium 134, potassium 3.9, chloride 97, CO2 of 32, BUN 33, creatinine 3.2, glucose 176. BNP 314. INR 1.1. Liver function shows total bilirubin 1.4, direct bilirubin 0.2, alkaline phosphatase 74, ALT 32, AST 33, total protein 8.1,  albumin 3.4. Lipase 319.   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  1. Acute renal failure. The exact etiology is unclear at this time; however, suspect that hypotension is playing some role in her acute renal failure. Suspect that she has acute tubular necrosis. We will proceed with renal ultrasound, SPEP, UPEP and ANA for further workup. Continue IV fluid hydration with 0.9 normal saline for now and follow up renal function this a.m. Certainly, avoid nephrotoxins such as IV contrast and NSAIDs. The patient was on NSAIDs at home, and we would certainly hold these at present.  2. Hypotension. The etiology of this is currently unclear. Agree with IV fluid hydration. Systolic blood pressure was in the 70s upon presentation. Blood pressure is a bit improved now. 3. Hyponatremia. Serum sodium noted to be 134. Volume depletion may be playing some role. Continue 0.9 normal saline at 75 mL/hour and follow up serum sodium today.  4. Hypomagnesemia. The patient was on diuretic therapy last week for increasing lower extremity edema. This may have led to hypomagnesemia. Agree with magnesium sulfate 2 grams IV x1 today.  5. Chest pain. The patient had left-sided chest pain which radiated to the left axilla as well as the neck.  However, troponins have been negative. Further workup per hospitalist.   I would like to thank Dr. Waldron Labs for this kind referral. Further plan as the patient progresses.   ____________________________ Tama High, MD mnl:lb D: 04/28/2013 09:26:38 ET T: 04/28/2013  09:49:13 ET JOB#: [id number]  cc: Tama High, MD, <Dictator> Tama High MD ELECTRONICALLY SIGNED 05/24/2013 15:36

## 2014-05-25 NOTE — Consult Note (Signed)
PATIENT NAME:  Theresa Leon, Theresa Leon MR#:  [id number] DATE OF BIRTH:  20-Jan-1956  DATE OF CONSULTATION:  01/17/2014  REFERRING PHYSICIAN:   CONSULTING PHYSICIAN:  Andria Meuse, NP  REQUESTING PHYSICIAN: Dr. Verdell Carmine.    PRIMARY CARE PHYSICIAN:  Dr. Quay Burow.    CONSULTING GASTROENTEROLOGIST:  Dr. Lucilla Lame.   HISTORY OF PRESENT ILLNESS: Theresa Leon is a 59 year old Caucasian female with multiple medical problems, who presented with 3 weeks of abdominal distention, shortness of breath, and weakness. She was admitted with acute respiratory failure and decreased urination and cough. She was found to have acute renal failure with a creatinine of 4.77 and a potassium of 6.6. Chest x-ray showed bilateral small pleural effusions. She was also in the hospital September of 2015 for a labial abscess and sepsis requiring surgery and had acute renal failure at that time secondary to septic shock. We were consulted because she is Hemoccult positive and she had anemia with a hemoglobin of 6.8. Her hemoglobin was 10.9 back in 2013. She also has thrombocytopenia with a platelet count of 119,000. She has been placed on a Protonix drip. Her BNP was 7178.  Her ferritin was 6, her iron saturation was 5%,. She had 2 negative blood cultures over the past 8-12 hours and a urinalysis showed a UTI and no hematuria. She had a negative troponin and normal TSH. Upon presenting to the room she was obtunded, was unable to answer my questions. Dr. Verdell Carmine was already at the bedside making plans for further evaluation and management of her respiratory status. Unfortunately there are no family members available at this time to answer questions. I did review her medical record and she has had an April 2013 EGD by Dr. Phylis Bougie that showed chronic gastritis. There are no records of a colonoscopy, but again the patient is unable to answer my questions and there is no family available.   PAST MEDICAL AND SURGICAL HISTORY:  (Obtained from her  chart), diabetes mellitus, hypotension, syncopal episode, atrial fibrillation, pacemaker in 2009, aortic stenosis, status post aortic valve replacement in 2008, obstructive sleep apnea, sepsis, and Fournier gangrene, necrotizing soft tissue infection of the right labia and perineum in September 2015, tonsillectomy, cholecystectomy, tubal ligation, thrombocytopenia, acute renal failure, chronic GERD, obesity, hypothyroidism, asthma, and chronic gastritis.   MEDICATIONS PRIOR TO ADMISSION: Acetaminophen and hydrocodone 325/5 mg q. 6 hours p.r.n., citalopram 10 mg daily, gabapentin 600 mg t.i.d., glipizide XL 2.5 mg daily, loperamide 2 mg q.i.d. diarrhea, meclizine 25 mg q. 6 hours p.r.n., metformin 500 mg daily, Singulair 10 mg daily, Synthroid 100 mcg daily, trazodone 50 mg daily, Xyzal 5 mg daily, ranitidine 150 mg b.i.d.   ALLERGIES: LOPRESSOR UNKNOWN REACTION, NITRATES UNKNOWN REACTION, NITRO-DERM UNKNOWN REACTION, TETANUS ANGIOEDEMA, ANIMAL DANDER, DUST, AND TAPE.    FAMILY HISTORY: Positive for diabetes and hypertension per the medical record.   SOCIAL HISTORY: The patient lives with her parents. No tobacco, alcohol, or illicit drug use noted in her chart. The patient unable to answer my questions.   REVIEW OF SYSTEMS: Unable to obtain.   PHYSICAL EXAMINATION:  VITAL SIGNS:  BMI 56.5, height 66.9 inches, weight 360.5 pounds. Temperature 97.5, pulse 60, respirations 18, blood pressure 105/53, O2 saturation 90% on 2 liters via nasal cannula. GENERAL: She is a morbidly obese, pale-appearing Caucasian female who has decreased respirations at this time.  Dr. Verdell Carmine at bedside.  The patient is nonresponsive and unable to answer my questions, however she does move with tactile stimuli.  HEENT:  Sclerae clear, nonicteric. Conjunctivae pink. Oropharynx pink and moist without any lesions.  NECK: Supple without any mass or thyromegaly.  CHEST: Heart rate irregularly irregular without any murmur, clicks,  rubs, or gallops.  LUNGS: With decreased breath sounds bilaterally, decreased effort, mild expiratory wheezes bilaterally.  ABDOMEN: Protuberant with positive bowel sounds x 4. No bruits auscultated. Abdomen is soft, nontender, mildly distended. No rebound, tenderness, or guarding. No hepatosplenomegaly or mass although exam is extremely limited given patient's body habitus.  EXTREMITIES: She has 2 + pretibial edema bilaterally.  NEUROLOGIC: Unable to assess, she is obtunded.  PSYCHIATRIC: Lethargic, obtunded.   LABORATORY STUDIES:  Glucose 162, BUN 69, creatinine 5.09, potassium 6.8, chloride 109, calcium 8.1, otherwise normal basic metabolic panel. Iron saturation 5, UIBC 452, TIBC 478, iron 26. LFTs are normal except for total bilirubin of 1.1 and albumin of 3.2. CK 31. Troponin negative. TSH normal. Blood cultures, no growth. Urine culture, no growth.   IMPRESSION: Theresa Leon is a 59 year old Caucasian female admitted with acute respiratory failure, possibly pneumonia, hyperkalemia, anemia, and Hemoccult-positive stool. At the time of visit the patient is obtunded and Dr. Verdell Carmine was at the bedside to address her acute respiratory failure. There are no reports of gross gastrointestinal bleeding. As a matter of fact she had a hemoglobin of 10.9 back in 2013 and has ranged in that area since that time. Her hemoglobin has dropped to 6.8 and she has only received 1 unit of packed RBCs. She also has chronic thrombocytopenia with platelets 119,000 today. Her last EGD in 2013 showed gastritis. We will continue to follow the patient with hospitalist, would consider EGD and colonoscopy once she is stable from a respiratory standpoint. At this point her acute on chronic iron deficiency anemia could be due to GI bleed (although there is no evidence of acute hemorrhage), malignancy such as colon or gastric carcinoma, or AVMs. Really she could have bleeding anywhere in her GI tract.   PLAN:  1.  N.p.o.  2.   Monitor for gross GI bleeding.  3.  Agree with Protonix drip.  4.  EGD and colonoscopy once she is stable from respiratory standpoint.  5.  Agree with transfusion to keep H and H stable above 7-8 grams. 6.  Hyperkalemia management per attending.   Thank you for allowing [place] to participate in her care.    ____________________________ Andria Meuse, NP klj:bu D: 01/17/2014 13:14:55 ET T: 01/17/2014 13:44:28 ET JOB#: [id number]  cc: Andria Meuse, NP, <Dictator> Ellamae Sia, MD Andria Meuse FNP ELECTRONICALLY SIGNED 02/09/2014 15:03

## 2014-05-25 NOTE — Discharge Summary (Signed)
PATIENT NAME:  Theresa Leon, Theresa Leon MR#:  [id number] DATE OF BIRTH:  1955-09-07  DATE OF ADMISSION:  01/16/2014 DATE OF DISCHARGE:  01/25/2014  ADDENDUM  The patient is accepted at [facility] facility and is being transferred today for further management over there. For all details, please see discharge summary done on January 24, 2014.     ____________________________ Ceasar Lund. Anselm Jungling, MD vgv:at D: 01/25/2014 10:11:00 ET T: 01/25/2014 10:30:09 ET JOB#: [id number]  cc: Ceasar Lund. Anselm Jungling, MD, <Dictator> Vaughan Basta MD ELECTRONICALLY SIGNED 02/02/2014 0:41

## 2014-05-25 NOTE — Op Note (Signed)
PATIENT NAME:  Theresa Leon, Theresa Leon MR#:  [id number] DATE OF BIRTH:  09-Jan-1956  DATE OF PROCEDURE:  01/17/2014   PREOPERATIVE DIAGNOSES:  1.  Acute renal failure.  2.  Multicystic organ failure.  3.  Coronary disease.  4.  Morbid obesity.   POSTOPERATIVE DIAGNOSES:   1.  Acute renal failure.  2.  Multicystic organ failure.  3.  Coronary disease.  4.  Morbid obesity.   PROCEDURE:  Attempted placement in the left femoral a dialysis catheter banding due to her size and placement of left jugular triple-lumen catheter.   SURGEON: Algernon Huxley, M.D.   ANESTHESIA: Local.   BLOOD LOSS: 50 mL.   INDICATION FOR PROCEDURE: This is a 59 year old female with acute renal failure. Multisystem organ failure and multiple other issues. We are asked to place a dialysis catheter. She had already had a line in her right jugular. Her size made things very difficult, and we needed to place a non-tunneled temporary dialysis catheter for immediate CRT use.   DESCRIPTION OF PROCEDURE:  I initially started left groin. I was able to gain needle access without difficulty, but given her massive size I could not get a wire and then a dilator into her groin safely and I abandoned this after two attempts and clearly this was not a good location for catheter placement. Once this was abandoned I turned my attention to the left neck. The left jugular vein was visualized and found to be widely patent. It was then accessed easily under direct ultrasound guidance without difficulty with a Seldinger needle. A J-wire was placed after skin nick and dilatation, the Duo-Glide 20 cm dialysis catheter was placed over the wire and the wire was removed. Both lumens withdrew dark red nonpulsatile blood and flushed easily with sterile saline. It was secured to the skin at 19 cm with 3 nylon sutures. Stat chest x-ray is pending.    ____________________________ Algernon Huxley, MD jsd:at D: 01/17/2014 14:14:12 ET T: 01/17/2014 15:02:33  ET JOB#: [id number]  cc: Algernon Huxley, MD, <Dictator> Algernon Huxley MD ELECTRONICALLY SIGNED 01/31/2014 14:13

## 2014-05-25 NOTE — H&P (Signed)
PATIENT NAME:  Theresa Leon, Theresa Leon MR#:  [id number] DATE OF BIRTH:  18-Nov-1955  DATE OF ADMISSION:  01/16/2014  PRIMARY CARE PROVIDER: Ellamae Sia, MD   CARDIOLOGIST: Dionisio David, MD   CHIEF COMPLAINT: Shortness of breath, abdominal distention, and weakness.   HISTORY OF PRESENTING ILLNESS: [name] with history of chronic hypotension, asthma, obstructive sleep apnea, aortic valve replacement with a pacemaker, diabetes, presents to the Emergency Room complaining of 3 weeks of abdominal distention, shortness of breath, and weakness.   She does not complain of any orthopnea or lower extremity edema. She has had decreased urination. No burning, no hematuria. Does not have any cough. Has decreased oral intake.  The patient has been closely following with Dr. Dionisio David and her primary care physician for some weakness, but there were no new medications started. She is not on any Lasix, no potassium supplementation. She has not noticed any black stools or blood in her stools.   The patient here in the Emergency Room has been found to have blood pressure ranging in the systolic of 62Z to 30Q which seems to be her baseline. Her stool Hemoccult is positive. She has acute renal failure with a creatinine of 4.77, potassium elevated at 6.6. Chest x-ray showing bilateral small pleural effusions. The patient is being admitted to hospitalist service for further workup and treatment. She has received a dose of IV insulin, dextrose, and calcium gluconate to this point.   The patient was in the hospital September 2015 for a labial abscess, septic shock, had to go through surgery; was also on CRRT briefly for acute renal failure from septic shock. Prior to discharge, the patient's creatinine was 1.07. She did get blood transfusion during the previous admission.   PAST MEDICAL HISTORY:  1.  Bioprosthetic aortic valve replacement.  2.  Diabetes mellitus.  3.  Morbid obesity.   4.  Obstructive sleep apnea.  5.  Permanent pacemaker.  6.  Asthma. 7.  History of septic shock needing CRRT briefly in September 2015.  8.  Thrombocytopenia.   SOCIAL HISTORY: The patient ambulates with a cane, does not use any home oxygen, lives with her parents, does not smoke. No alcohol. No illicit drug use.   CODE STATUS: Full code.   FAMILY HISTORY: Positive for diabetes and hypertension.   ALLERGIES: LOPRESSOR, NITRATES, TETANUS, DUST, AND TAPE.   REVIEW OF SYSTEMS:  CONSTITUTIONAL: Complains of fatigue, weakness; no fever.  EYES: No blurry vision, pain, or redness.  ENT: No tinnitus, ear pain, hearing loss.  RESPIRATORY: No cough, wheeze, hemoptysis.  CARDIOVASCULAR: No chest pain, orthopnea, edema.   GASTROINTESTINAL: No nausea, vomiting, abdominal pain.  GENITOURINARY: Has cloudy urine, decreased urination. NEUROLOGICAL: No focal numbness, weakness. Has generalized weakness.  PSYCHIATRIC: No anxiety or depression.   HOME MEDICATIONS: 1.  Acetaminophen and hydrocodone 325/5, 1 tablet every 6 hours as needed.  2.  Citalopram 10 mg oral once a day.  3.  Gabapentin 600 mg 3 times a day.  4.  Glipizide XL 2.5 mg oral once a day.  5.  Loperamide 2 mg 4 times a day as needed for diarrhea.  6.  Meclizine 25 mg every 6 hours as needed.  7.  Metformin 500 mg daily.  9.  Singulair 10 mg daily.  10.  Synthroid 100 mcg daily. 11.  Trazodone 50 mg daily.  12.  Xyzal 5 mg oral once a day.   PHYSICAL EXAMINATION: Shows:  VITAL SIGNS: Temperature  98.3; pulse of 60; blood pressure 90/44; saturating 80% on room air, 100% on oxygen.  GENERAL: Morbidly obese Caucasian female patient lying in bed, overall seems comfortable, conversational, cooperative with exam.  PSYCHIATRIC: Alert and oriented x 3. Mood and affect appropriate. Judgment intact.  HEENT: Atraumatic, normocephalic. Oral mucosa moist and pink. External ears and nose normal. Pallor positive. No icterus. Pupils bilaterally  equal and reactive to light.  NECK: Supple. No thyromegaly. No palpable lymph nodes. Trachea midline. No carotid bruit, JVD.  CARDIOVASCULAR: S1, S2. Systolic murmur. Peripheral pulses 2+. No edema.  RESPIRATORY: Normal work of breathing. Clear to auscultation both sides.  GASTROINTESTINAL: Soft abdomen, no tenderness. Distended abdomen. No rigidity, guarding. Bowel sounds present. GENITOURINARY: Perineal examination looks normal. No abscess, no discharge found.  SKIN: Warm and dry. No petechiae, rash, ulcers.  MUSCULOSKELETAL: No joint swelling, redness, effusion of the large joints. Normal muscle tone.  NEUROLOGICAL: Motor strength is 5/5 in upper and lower extremities. Sensation and vibration intact all over.  LYMPHATIC: No cervical lymphadenopathy.   LABORATORY STUDIES: Glucose of 116. BNP of 7000. BUN 66, creatinine 4.77, sodium 140, potassium 6.6, chloride 112, bicarbonate 23, anion gap 5. Troponin 0.04. WBC 7.5, hemoglobin 6.8, platelets of 119,000. Blood group A positive.  EKG shows a paced rhythm.   IMAGING: Chest x-ray shows cardiomegaly and small bilateral effusions, bilateral basilar atelectasis. No pulmonary edema or infiltrates found.   ASSESSMENT AND PLAN:  1.  Acute renal failure with hyperkalemia. The patient's bicarbonate is in the normal range and it looks like her acute renal failure could have been going on for the past few days. At this point possible causes are infection and hypovolemic from gastrointestinal bleed. Will start her on intravenous fluids. We will also put her on a bicarbonate drip, considering her hyperkalemia. Consult nephrology. The patient does not have any edema, does not seem fluid overloaded, but if she does stay oliguric with worsening condition, she may need dialysis. The patient does have hypotension, but this seems to be her baseline reviewing old records, with her systolic being in the 70J to 100s at baseline. We will get an ultrasound of her kidneys  stat, repeat a BMP in 3 hours to monitor her potassium level. 2.  Gastrointestinal bleed. Patient stool was positive for blood but she does have hemorrhoids, does not have any black stools. Could be upper or lower gastrointestinal bleed. Will put her on a Protonix drip, transfuse 2 units of packed red blood cells, and consult gastroenterology for further input with the case. She will need further endoscopic investigation, although contributing factor to her anemia could also be her renal failure. We will check iron studies.  3.  Acute respiratory failure, likely from bilateral pneumonia, as chest x-ray shows basal atelectasis versus infiltrates and pleural effusions. Will start her on ceftriaxone, azithromycin; put her on DuoNeb p.r.n. and oxygen support. Blood cultures have been drawn. Check sputum cultures.  4.  Chronic thrombocytopenia, stable.  5.  Deep vein thrombosis prophylaxis with sequential compression devices.   CRITICAL CARE TIME SPENT ON THIS PATIENT: 55 minutes.   ____________________________ Leia Alf Osha Errico, MD srs:ST D: 01/16/2014 21:56:00 ET T: 01/17/2014 01:16:47 ET JOB#: [id number]  cc: Alveta Heimlich R. Darvin Neighbours, MD, <Dictator> Ellamae Sia, MD Dionisio David, MD Neita Carp MD ELECTRONICALLY SIGNED 02/11/2014 12:46

## 2014-06-28 ENCOUNTER — Ambulatory Visit: Payer: Medicare Other | Admitting: Gastroenterology

## 2014-08-04 ENCOUNTER — Encounter
Admission: RE | Disposition: A | Discharge status: Home / Self Care | Payer: Self-pay | Source: Ambulatory Visit | Attending: Unknown Physician Specialty

## 2014-08-04 ENCOUNTER — Ambulatory Visit: Payer: Medicare Other | Admitting: Anesthesiology

## 2014-08-04 ENCOUNTER — Ambulatory Visit
Admission: RE | Admit: 2014-08-04 | Discharge: 2014-08-04 | Disposition: A | Discharge status: Home / Self Care | Payer: Medicare Other | Source: Ambulatory Visit | Attending: Unknown Physician Specialty | Admitting: Unknown Physician Specialty

## 2014-08-04 ENCOUNTER — Encounter: Payer: Self-pay | Admitting: Anesthesiology

## 2014-08-04 DIAGNOSIS — J45909 Unspecified asthma, uncomplicated: Secondary | ICD-10-CM | POA: Diagnosis not present

## 2014-08-04 DIAGNOSIS — K2901 Acute gastritis with bleeding: Secondary | ICD-10-CM | POA: Insufficient documentation

## 2014-08-04 DIAGNOSIS — K648 Other hemorrhoids: Secondary | ICD-10-CM | POA: Insufficient documentation

## 2014-08-04 DIAGNOSIS — K5730 Diverticulosis of large intestine without perforation or abscess without bleeding: Secondary | ICD-10-CM | POA: Insufficient documentation

## 2014-08-04 DIAGNOSIS — E039 Hypothyroidism, unspecified: Secondary | ICD-10-CM | POA: Insufficient documentation

## 2014-08-04 DIAGNOSIS — Z79899 Other long term (current) drug therapy: Secondary | ICD-10-CM | POA: Diagnosis not present

## 2014-08-04 DIAGNOSIS — Z888 Allergy status to other drugs, medicaments and biological substances status: Secondary | ICD-10-CM | POA: Diagnosis not present

## 2014-08-04 DIAGNOSIS — G4733 Obstructive sleep apnea (adult) (pediatric): Secondary | ICD-10-CM | POA: Diagnosis not present

## 2014-08-04 DIAGNOSIS — N189 Chronic kidney disease, unspecified: Secondary | ICD-10-CM | POA: Insufficient documentation

## 2014-08-04 DIAGNOSIS — D125 Benign neoplasm of sigmoid colon: Secondary | ICD-10-CM | POA: Diagnosis not present

## 2014-08-04 DIAGNOSIS — I5033 Acute on chronic diastolic (congestive) heart failure: Secondary | ICD-10-CM | POA: Insufficient documentation

## 2014-08-04 DIAGNOSIS — J449 Chronic obstructive pulmonary disease, unspecified: Secondary | ICD-10-CM | POA: Insufficient documentation

## 2014-08-04 DIAGNOSIS — Z79891 Long term (current) use of opiate analgesic: Secondary | ICD-10-CM | POA: Insufficient documentation

## 2014-08-04 DIAGNOSIS — D509 Iron deficiency anemia, unspecified: Secondary | ICD-10-CM | POA: Diagnosis present

## 2014-08-04 DIAGNOSIS — Z952 Presence of prosthetic heart valve: Secondary | ICD-10-CM | POA: Diagnosis not present

## 2014-08-04 DIAGNOSIS — Z87891 Personal history of nicotine dependence: Secondary | ICD-10-CM | POA: Insufficient documentation

## 2014-08-04 DIAGNOSIS — E119 Type 2 diabetes mellitus without complications: Secondary | ICD-10-CM | POA: Insufficient documentation

## 2014-08-04 HISTORY — PX: ESOPHAGOGASTRODUODENOSCOPY (EGD) WITH PROPOFOL: SHX5813

## 2014-08-04 HISTORY — PX: COLONOSCOPY WITH PROPOFOL: SHX5780

## 2014-08-04 LAB — GLUCOSE, CAPILLARY: GLUCOSE-CAPILLARY: 114 mg/dL — AB (ref 65–99)

## 2014-08-04 SURGERY — COLONOSCOPY
Anesthesia: General

## 2014-08-04 SURGERY — COLONOSCOPY WITH PROPOFOL
Anesthesia: General

## 2014-08-04 MED ORDER — SODIUM CHLORIDE 0.9 % IV SOLN
INTRAVENOUS | Status: DC
  Administered 2014-08-04: 11:00:00 via INTRAVENOUS

## 2014-08-04 MED ORDER — LIDOCAINE HCL (CARDIAC) 20 MG/ML IV SOLN
INTRAVENOUS | Status: DC | PRN
  Administered 2014-08-04: 100 mg via INTRAVENOUS

## 2014-08-04 MED ORDER — PROPOFOL 10 MG/ML IV BOLUS
INTRAVENOUS | Status: DC | PRN
  Administered 2014-08-04: 30 mg via INTRAVENOUS

## 2014-08-04 MED ORDER — BUTAMBEN-TETRACAINE-BENZOCAINE 2-2-14 % EX AERO
INHALATION_SPRAY | CUTANEOUS | Status: DC | PRN
  Administered 2014-08-04: 1 via TOPICAL

## 2014-08-04 MED ORDER — GLYCOPYRROLATE 0.2 MG/ML IJ SOLN
INTRAMUSCULAR | Status: DC | PRN
  Administered 2014-08-04: 0.3 mg via INTRAVENOUS

## 2014-08-04 MED ORDER — MIDAZOLAM HCL 5 MG/5ML IJ SOLN
INTRAMUSCULAR | Status: DC | PRN
  Administered 2014-08-04: 1 mg via INTRAVENOUS

## 2014-08-04 MED ORDER — PIPERACILLIN-TAZOBACTAM 3.375 G IVPB 30 MIN
3.3750 g | Freq: Once | INTRAVENOUS | Status: AC
  Administered 2014-08-04: 3.375 g via INTRAVENOUS
  Filled 2014-08-04: qty 50

## 2014-08-04 MED ORDER — PROPOFOL INFUSION 10 MG/ML OPTIME
INTRAVENOUS | Status: DC | PRN
  Administered 2014-08-04: 160 ug/kg/min via INTRAVENOUS

## 2014-08-04 MED ORDER — PHENYLEPHRINE HCL 10 MG/ML IJ SOLN
INTRAMUSCULAR | Status: DC | PRN
  Administered 2014-08-04 (×2): 100 ug via INTRAVENOUS

## 2014-08-04 MED ORDER — ALFENTANIL 500 MCG/ML IJ INJ
INJECTION | INTRAMUSCULAR | Status: DC | PRN
  Administered 2014-08-04: 500 ug via INTRAVENOUS

## 2014-08-04 NOTE — Anesthesia Preprocedure Evaluation (Signed)
Anesthesia Evaluation  Patient identified by MRN, date of birth, ID band Patient awake    Reviewed: Allergy & Precautions, H&P , NPO status , Patient's Chart, lab work & pertinent test results, reviewed documented beta blocker date and time   Airway Mallampati: III  TM Distance: >3 FB Neck ROM: limited    Dental no notable dental hx. (+) Teeth Intact   Pulmonary asthma , sleep apnea and Continuous Positive Airway Pressure Ventilation , COPDformer smoker,  breath sounds clear to auscultation  Pulmonary exam normal       Cardiovascular hypertension, +CHF - Past MI Normal cardiovascular exam+ pacemaker Rhythm:regular Rate:Normal     Neuro/Psych negative neurological ROS  negative psych ROS   GI/Hepatic negative GI ROS, Neg liver ROS,   Endo/Other  diabetesHypothyroidism   Renal/GU Renal disease  negative genitourinary   Musculoskeletal   Abdominal   Peds  Hematology negative hematology ROS (+)   Anesthesia Other Findings History of tracheostomy   Reproductive/Obstetrics negative OB ROS                             Anesthesia Physical Anesthesia Plan  ASA: III  Anesthesia Plan: General   Post-op Pain Management:    Induction:   Airway Management Planned:   Additional Equipment:   Intra-op Plan:   Post-operative Plan:   Informed Consent: I have reviewed the patients History and Physical, chart, labs and discussed the procedure including the risks, benefits and alternatives for the proposed anesthesia with the patient or authorized representative who has indicated his/her understanding and acceptance.   Dental Advisory Given  Plan Discussed with: Anesthesiologist, CRNA and Surgeon  Anesthesia Plan Comments:         Anesthesia Quick Evaluation

## 2014-08-04 NOTE — Op Note (Signed)
[facility] Gastroenterology Patient Name: Theresa Leon Procedure Date: 08/04/2014 11:03 AM MRN: [id number] Account #: [id number] Date of Birth: March 11, 1955 Admit Type: Outpatient Age: 59 Room: [facility] ENDO ROOM 1 Gender: Female Note Status: Finalized Procedure:         Upper GI endoscopy Indications:       Unexplained iron deficiency anemia Providers:         Manya Silvas, MD Referring MD:      Ellin Goodie, MD (Referring MD) Medicines:         Propofol per Anesthesia Complications:     No immediate complications. Procedure:         Pre-Anesthesia Assessment:                    - After reviewing the risks and benefits, the patient was                     deemed in satisfactory condition to undergo the procedure.                    After obtaining informed consent, the endoscope was passed                     under direct vision. Throughout the procedure, the                     patient's blood pressure, pulse, and oxygen saturations                     were monitored continuously. The Olympus GIF-160 endoscope                     (S#. [id number]) was introduced through the mouth, and                     advanced to the second part of duodenum. The upper GI                     endoscopy was accomplished without difficulty. The patient                     tolerated the procedure well. Findings:      Small sessile oval tissue bump likely related to previous prolonged       intubation.      The examined esophagus was normal. GEJ 44cm.      Patchy mildly erythematous mucosa without bleeding was found in the       gastric antrum. Biopsies were taken with a cold forceps for Helicobacter       pylori testing. Biopsies were taken with a cold forceps for histology.      The examined duodenum was normal. Impression:        - Normal esophagus.                    - Erythematous mucosa in the antrum. Biopsied.                    - Normal examined duodenum. Recommendation:     - Await pathology results. Procedure Code(s): --- Professional ---                    [phone], Esophagogastroduodenoscopy, flexible, transoral;  with biopsy, single or multiple CPT copyright 2014 American Medical Association. All rights reserved. The codes documented in this report are preliminary and upon coder review may  be revised to meet current compliance requirements. Manya Silvas, MD 08/04/2014 11:18:53 AM This report has been signed electronically. Number of Addenda: 0 Note Initiated On: 08/04/2014 11:03 AM      [facility]

## 2014-08-04 NOTE — Op Note (Signed)
[facility] Gastroenterology Patient Name: Theresa Leon Procedure Date: 08/04/2014 10:54 AM MRN: [id number] Account #: [id number] Date of Birth: October 11, 1955 Admit Type: Outpatient Age: 59 Room: [facility] ENDO ROOM 1 Gender: Female Note Status: Finalized Procedure:         Colonoscopy Indications:       Iron deficiency anemia Providers:         Manya Silvas, MD Referring MD:      Ellin Goodie, MD (Referring MD) Medicines:         Propofol per Anesthesia Complications:     No immediate complications. Procedure:         Pre-Anesthesia Assessment:                    - After reviewing the risks and benefits, the patient was                     deemed in satisfactory condition to undergo the procedure.                    After obtaining informed consent, the colonoscope was                     passed under direct vision. Throughout the procedure, the                     patient's blood pressure, pulse, and oxygen saturations                     were monitored continuously. The Colonoscope was                     introduced through the anus and advanced to the the cecum,                     identified by appendiceal orifice and ileocecal valve. The                     colonoscopy was performed without difficulty. The patient                     tolerated the procedure well. The quality of the bowel                     preparation was excellent. Findings:      A diminutive polyp was found in the sigmoid colon. The polyp was       sessile. The polyp was removed with a cold snare. Resection and       retrieval were complete.      A few small-mouthed diverticula were found in the sigmoid colon.      Internal hemorrhoids were found during endoscopy. The hemorrhoids were       small and Grade I (internal hemorrhoids that do not prolapse).      The exam was otherwise without abnormality. Impression:        - One diminutive polyp in the sigmoid colon. Resected and          retrieved.                    - Diverticulosis in the sigmoid colon.                    - Internal hemorrhoids.                    -  The examination was otherwise normal. Recommendation:    - Await pathology results. Manya Silvas, MD 08/04/2014 11:33:51 AM This report has been signed electronically. Number of Addenda: 0 Note Initiated On: 08/04/2014 10:54 AM Scope Withdrawal Time: 0 hours 9 minutes 47 seconds  Total Procedure Duration: 0 hours 11 minutes 37 seconds       [facility]

## 2014-08-04 NOTE — H&P (Signed)
Primary Care Physician:  Ellamae Sia, MD Primary Gastroenterologist:  Dr. Vira Agar  Pre-Procedure History & Physical: HPI:  Theresa Leon is a 59 y.o. female is here for an endoscopy and colonoscopy.   Past Medical History  Diagnosis Date  . Asthma 1975  . Diabetes mellitus without complication 5621  . Heart disease   . 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 COPD (chronic obstructive pulmonary disease)   . OSA (obstructive sleep apnea)   . Diastolic CHF, acute on chronic   . Acute on chronic renal failure   . Anemia   . Diabetes   . H/O aortic valve replacement   . Morbid obesity   . Hypothyroid     Past Surgical History  Procedure Laterality Date  . Back surgery      AS CHILD  . Phrenic nerve pacemaker implantation  2009  . Aortic valve relaced   2008  . Cholecystectomy  2007  . Breast surgery  September 25, 2011    intraductal papilloma of the left breast  . Tracheostomy tube placement N/A 02/04/2014    Procedure: TRACHEOSTOMY;  Surgeon: Rozetta Nunnery, MD;  Location: [place];  Service: ENT;  Laterality: N/A;    Prior to Admission medications   Medication Sig Start Date End Date Taking? Authorizing Provider  albuterol (PROVENTIL HFA;VENTOLIN HFA) 108 (90 BASE) MCG/ACT inhaler Inhale 2 puffs into the lungs every 6 (six) hours as needed for wheezing.    Historical Provider, MD  Calcium Carbonate-Vitamin D (CALCIUM + D PO) Take 1 tablet by mouth daily.    Historical Provider, MD  citalopram (CELEXA) 20 MG tablet Take 1 tablet by mouth 2 (two) times daily. 07/10/12   Historical Provider, MD  diclofenac (VOLTAREN) 75 MG EC tablet Take 75 mg by mouth 2 (two) times daily.    Historical Provider, MD  EPINEPHrine (EPIPEN) 0.3 mg/0.3 mL DEVI Inject into the muscle once.    Historical Provider, MD  fluticasone (FLONASE) 50 MCG/ACT nasal spray Place 2 sprays into the nose daily as needed. 06/06/12   Historical Provider, MD  gabapentin  (NEURONTIN) 600 MG tablet Take 600 mg by mouth 3 (three) times daily.    Historical Provider, MD  hydrochlorothiazide (HYDRODIURIL) 25 MG tablet Take 1 tablet by mouth daily. 07/10/12   Historical Provider, MD  HYDROcodone-acetaminophen (NORCO/VICODIN) 5-325 MG per tablet Take 1 tablet by mouth 2 (two) times daily. 07/03/12   Historical Provider, MD  levothyroxine (SYNTHROID, LEVOTHROID) 100 MCG tablet Take 100 mcg by mouth daily.    Historical Provider, MD  loratadine (CLARITIN) 10 MG tablet Take 10 mg by mouth daily.    Historical Provider, MD  meclizine (ANTIVERT) 25 MG tablet Take 1 tablet by mouth every 6 (six) hours. 06/12/12   Historical Provider, MD  montelukast (SINGULAIR) 10 MG tablet Take 10 mg by mouth at bedtime.    Historical Provider, MD  omeprazole (PRILOSEC) 40 MG capsule Take 40 mg by mouth daily.    Historical Provider, MD  ranitidine (ZANTAC) 150 MG capsule Take 150 mg by mouth 2 (two) times daily.    Historical Provider, MD  SOTALOL AF 80 MG TABS Take 1 tablet by mouth 2 (two) times daily. 07/10/12   Historical Provider, MD  traZODone (DESYREL) 50 MG tablet Take 1 tablet by mouth at bedtime as needed. 07/10/12   Historical Provider, MD    Allergies as of 06/21/2014 - Review Complete 02/23/2014  Allergen Reaction Noted  . Mold extract [trichophyton]  03/28/2012  . Nitroglycerin Other (See Comments) 07/27/2012    Family History  Problem Relation Age of Onset  . Skin telangiectasia Mother   . Lung cancer Maternal Grandmother   . Lung cancer Paternal Grandmother   . Breast cancer Maternal Grandmother     History   Social History  . Marital Status: Widowed    Spouse Name: N/A  . Number of Children: N/A  . Years of Education: N/A   Occupational History  . Not on file.   Social History Main Topics  . Smoking status: Former Research scientist (life sciences)  . Smokeless tobacco: Not on file  . Alcohol Use: No  . Drug Use: No  . Sexual Activity: Not on file   Other Topics Concern  . Not on  file   Social History Narrative    Review of Systems: See HPI, otherwise negative ROS  Physical Exam: BP 121/61 mmHg  Pulse 70  Temp(Src) 99.4 F (37.4 C) (Tympanic)  Resp 17  Ht 5' 7"  (1.702 m)  Wt 140.615 kg (310 lb)  BMI 48.54 kg/m2  SpO2 96% General:   Alert,  pleasant and cooperative in NAD Head:  Normocephalic and atraumatic. Neck:  Supple; no masses or thyromegaly. Lungs:  Clear throughout to auscultation.    Heart:  Regular rate and rhythm. Abdomen:  Soft, nontender and nondistended. Normal bowel sounds, without guarding, and without rebound.   Neurologic:  Alert and  oriented x4;  grossly normal neurologically.  Impression/Plan: [name] is here for an endoscopy and colonoscopy to be performed for iron def anemia  Risks, benefits, limitations, and alternatives regarding  endoscopy and colonoscopy have been reviewed with the patient.  Questions have been answered.  All parties agreeable.   Gaylyn Cheers, MD  08/04/2014, 11:05 AM   Primary Care Physician:  Ellamae Sia, MD Primary Gastroenterologist:  Dr. Vira Agar  Pre-Procedure History & Physical: HPI:  Theresa Leon is a 59 y.o. female is here for an endoscopy and colonoscopy.   Past Medical History  Diagnosis Date  . Asthma 1975  . Diabetes mellitus without complication 3151  . Heart disease   . 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 COPD (chronic obstructive pulmonary disease)   . OSA (obstructive sleep apnea)   . Diastolic CHF, acute on chronic   . Acute on chronic renal failure   . Anemia   . Diabetes   . H/O aortic valve replacement   . Morbid obesity   . Hypothyroid     Past Surgical History  Procedure Laterality Date  . Back surgery      AS CHILD  . Phrenic nerve pacemaker implantation  2009  . Aortic valve relaced   2008  . Cholecystectomy  2007  . Breast surgery  September 25, 2011    intraductal papilloma of the left breast  .  Tracheostomy tube placement N/A 02/04/2014    Procedure: TRACHEOSTOMY;  Surgeon: Rozetta Nunnery, MD;  Location: [place];  Service: ENT;  Laterality: N/A;    Prior to Admission medications   Medication Sig Start Date End Date Taking? Authorizing Provider  albuterol (PROVENTIL HFA;VENTOLIN HFA) 108 (90 BASE) MCG/ACT inhaler Inhale 2 puffs into the lungs every 6 (six) hours as needed for wheezing.    Historical Provider, MD  Calcium Carbonate-Vitamin D (CALCIUM + D PO) Take 1 tablet by mouth daily.    Historical  Provider, MD  citalopram (CELEXA) 20 MG tablet Take 1 tablet by mouth 2 (two) times daily. 07/10/12   Historical Provider, MD  diclofenac (VOLTAREN) 75 MG EC tablet Take 75 mg by mouth 2 (two) times daily.    Historical Provider, MD  EPINEPHrine (EPIPEN) 0.3 mg/0.3 mL DEVI Inject into the muscle once.    Historical Provider, MD  fluticasone (FLONASE) 50 MCG/ACT nasal spray Place 2 sprays into the nose daily as needed. 06/06/12   Historical Provider, MD  gabapentin (NEURONTIN) 600 MG tablet Take 600 mg by mouth 3 (three) times daily.    Historical Provider, MD  hydrochlorothiazide (HYDRODIURIL) 25 MG tablet Take 1 tablet by mouth daily. 07/10/12   Historical Provider, MD  HYDROcodone-acetaminophen (NORCO/VICODIN) 5-325 MG per tablet Take 1 tablet by mouth 2 (two) times daily. 07/03/12   Historical Provider, MD  levothyroxine (SYNTHROID, LEVOTHROID) 100 MCG tablet Take 100 mcg by mouth daily.    Historical Provider, MD  loratadine (CLARITIN) 10 MG tablet Take 10 mg by mouth daily.    Historical Provider, MD  meclizine (ANTIVERT) 25 MG tablet Take 1 tablet by mouth every 6 (six) hours. 06/12/12   Historical Provider, MD  montelukast (SINGULAIR) 10 MG tablet Take 10 mg by mouth at bedtime.    Historical Provider, MD  omeprazole (PRILOSEC) 40 MG capsule Take 40 mg by mouth daily.    Historical Provider, MD  ranitidine (ZANTAC) 150 MG capsule Take 150 mg by mouth 2 (two) times daily.    Historical  Provider, MD  SOTALOL AF 80 MG TABS Take 1 tablet by mouth 2 (two) times daily. 07/10/12   Historical Provider, MD  traZODone (DESYREL) 50 MG tablet Take 1 tablet by mouth at bedtime as needed. 07/10/12   Historical Provider, MD    Allergies as of 06/21/2014 - Review Complete 02/23/2014  Allergen Reaction Noted  . Mold extract [trichophyton]  03/28/2012  . Nitroglycerin Other (See Comments) 07/27/2012    Family History  Problem Relation Age of Onset  . Skin telangiectasia Mother   . Lung cancer Maternal Grandmother   . Lung cancer Paternal Grandmother   . Breast cancer Maternal Grandmother     History   Social History  . Marital Status: Widowed    Spouse Name: N/A  . Number of Children: N/A  . Years of Education: N/A   Occupational History  . Not on file.   Social History Main Topics  . Smoking status: Former Research scientist (life sciences)  . Smokeless tobacco: Not on file  . Alcohol Use: No  . Drug Use: No  . Sexual Activity: Not on file   Other Topics Concern  . Not on file   Social History Narrative    Review of Systems: See HPI, otherwise negative ROS  Physical Exam: BP 121/61 mmHg  Pulse 70  Temp(Src) 99.4 F (37.4 C) (Tympanic)  Resp 17  Ht 5' 7"  (1.702 m)  Wt 140.615 kg (310 lb)  BMI 48.54 kg/m2  SpO2 96% General:   Alert,  pleasant and cooperative in NAD Head:  Normocephalic and atraumatic. Neck:  Supple; no masses or thyromegaly. Lungs:  Clear throughout to auscultation.    Heart:  Regular rate and rhythm. Abdomen:  Soft, nontender and nondistended. Normal bowel sounds, without guarding, and without rebound.   Neurologic:  Alert and  oriented x4;  grossly normal neurologically.  Impression/Plan: Theresa Leon is here for an endoscopy and colonoscopy to be performed for iron def anemia  Risks, benefits, limitations, and  alternatives regarding  endoscopy and colonoscopy have been reviewed with the patient.  Questions have been answered.  All parties  agreeable.   Gaylyn Cheers, MD  08/04/2014, 11:05 AM

## 2014-08-04 NOTE — Anesthesia Postprocedure Evaluation (Signed)
  Anesthesia Post-op Note  Patient: Theresa Leon  Procedure(s) Performed: Procedure(s): COLONOSCOPY WITH PROPOFOL (N/A) ESOPHAGOGASTRODUODENOSCOPY (EGD) WITH PROPOFOL  Anesthesia type:General  Patient location: PACU  Post pain: Pain level controlled  Post assessment: Post-op Vital signs reviewed, Patient's Cardiovascular Status Stable, Respiratory Function Stable, Patent Airway and No signs of Nausea or vomiting  Post vital signs: Reviewed and stable  Last Vitals:  Filed Vitals:   08/04/14 1150  BP: 91/53  Pulse: 78  Temp:   Resp: 14    Level of consciousness: awake, alert  and patient cooperative  Complications: No apparent anesthesia complications

## 2014-08-04 NOTE — Transfer of Care (Signed)
Immediate Anesthesia Transfer of Care Note  Patient: Theresa Leon  Procedure(s) Performed: Procedure(s): COLONOSCOPY WITH PROPOFOL (N/A) ESOPHAGOGASTRODUODENOSCOPY (EGD) WITH PROPOFOL  Patient Location: PACU  Anesthesia Type:General  Level of Consciousness: awake, alert , oriented and patient cooperative  Airway & Oxygen Therapy: Patient Spontanous Breathing and Patient connected to nasal cannula oxygen  Post-op Assessment: Report given to RN and Post -op Vital signs reviewed and stable  Post vital signs: Reviewed and stable  Last Vitals:  Filed Vitals:   08/04/14 1139  BP: 90/54  Pulse:   Temp: 36.9 C  Resp: 12    Complications: No apparent anesthesia complications

## 2014-08-05 ENCOUNTER — Encounter: Payer: Self-pay | Admitting: Unknown Physician Specialty

## 2014-08-05 LAB — SURGICAL PATHOLOGY

## 2014-09-28 ENCOUNTER — Encounter: Payer: Self-pay | Admitting: *Deleted

## 2014-09-28 ENCOUNTER — Emergency Department: Payer: Medicare Other

## 2014-09-28 ENCOUNTER — Emergency Department
Admission: EM | Admit: 2014-09-28 | Discharge: 2014-09-28 | Disposition: A | Discharge status: Home / Self Care | Payer: Medicare Other | Attending: Emergency Medicine | Admitting: Emergency Medicine

## 2014-09-28 ENCOUNTER — Other Ambulatory Visit: Payer: Self-pay

## 2014-09-28 DIAGNOSIS — R079 Chest pain, unspecified: Secondary | ICD-10-CM | POA: Insufficient documentation

## 2014-09-28 DIAGNOSIS — Z79899 Other long term (current) drug therapy: Secondary | ICD-10-CM | POA: Insufficient documentation

## 2014-09-28 DIAGNOSIS — E119 Type 2 diabetes mellitus without complications: Secondary | ICD-10-CM | POA: Insufficient documentation

## 2014-09-28 DIAGNOSIS — Z87891 Personal history of nicotine dependence: Secondary | ICD-10-CM | POA: Insufficient documentation

## 2014-09-28 DIAGNOSIS — Z791 Long term (current) use of non-steroidal anti-inflammatories (NSAID): Secondary | ICD-10-CM | POA: Diagnosis not present

## 2014-09-28 DIAGNOSIS — H66002 Acute suppurative otitis media without spontaneous rupture of ear drum, left ear: Secondary | ICD-10-CM | POA: Diagnosis not present

## 2014-09-28 DIAGNOSIS — H9203 Otalgia, bilateral: Secondary | ICD-10-CM | POA: Diagnosis present

## 2014-09-28 LAB — CBC
HEMATOCRIT: 32.5 % — AB (ref 35.0–47.0)
HEMOGLOBIN: 10.7 g/dL — AB (ref 12.0–16.0)
MCH: 29.1 pg (ref 26.0–34.0)
MCHC: 32.9 g/dL (ref 32.0–36.0)
MCV: 88.5 fL (ref 80.0–100.0)
Platelets: 163 10*3/uL (ref 150–440)
RBC: 3.68 MIL/uL — AB (ref 3.80–5.20)
RDW: 14.7 % — ABNORMAL HIGH (ref 11.5–14.5)
WBC: 9.3 10*3/uL (ref 3.6–11.0)

## 2014-09-28 LAB — BASIC METABOLIC PANEL
ANION GAP: 7 (ref 5–15)
BUN: 28 mg/dL — ABNORMAL HIGH (ref 6–20)
CO2: 29 mmol/L (ref 22–32)
Calcium: 9.5 mg/dL (ref 8.9–10.3)
Chloride: 104 mmol/L (ref 101–111)
Creatinine, Ser: 1.55 mg/dL — ABNORMAL HIGH (ref 0.44–1.00)
GFR calc Af Amer: 42 mL/min — ABNORMAL LOW (ref >60)
GFR, EST NON AFRICAN AMERICAN: 36 mL/min — AB (ref >60)
GLUCOSE: 93 mg/dL (ref 65–99)
POTASSIUM: 5.1 mmol/L (ref 3.5–5.1)
Sodium: 140 mmol/L (ref 135–145)

## 2014-09-28 LAB — TROPONIN I: Troponin I: <0.03 ng/mL (ref <0.031)

## 2014-09-28 MED ORDER — OXYCODONE-ACETAMINOPHEN 5-325 MG PO TABS
ORAL_TABLET | ORAL | Status: AC
  Administered 2014-09-28: 1 via ORAL
  Filled 2014-09-28: qty 1

## 2014-09-28 MED ORDER — OXYCODONE-ACETAMINOPHEN 5-325 MG PO TABS
1.0000 | ORAL_TABLET | Freq: Once | ORAL | Status: AC
  Administered 2014-09-28: 1 via ORAL

## 2014-09-28 NOTE — Discharge Instructions (Signed)

## 2014-09-28 NOTE — ED Provider Notes (Signed)
[facility] Emergency Department Provider Note  ____________________________________________  Time seen: 7:45 PM  I have reviewed the triage vital signs and the nursing notes.   HISTORY  Chief Complaint Otalgia and Chest Pain    HPI Theresa Leon is a 59 y.o. female who complains of left ear ache for the past 4 days. She started amoxicillin yesterday. Today she feels like her right ear hurts as well as some discomfort in her chest. No shortness of breath. No cough. Normal oral intake. No facial pain.No headache, normal hearing     Past Medical History  Diagnosis Date  . Asthma 1975  . Diabetes mellitus without complication 0623  . Heart disease   . 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 COPD (chronic obstructive pulmonary disease)   . OSA (obstructive sleep apnea)   . Diastolic CHF, acute on chronic   . Acute on chronic renal failure   . Anemia   . Diabetes   . H/O aortic valve replacement   . Morbid obesity   . Hypothyroid      Patient Active Problem List   Diagnosis Date Noted  . Dysphagia   . CHF (congestive heart failure)   . Acute respiratory failure with hypoxia 01/26/2014  . Encounter for imaging study to confirm orogastric (OG) tube placement   . Respiratory failure   . 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 Back surgery      AS CHILD  . Phrenic nerve pacemaker implantation  2009  . Aortic valve relaced   2008  . Cholecystectomy  2007  . Breast surgery  September 25, 2011    intraductal papilloma of the left breast  . Tracheostomy tube placement N/A 02/04/2014    Procedure: TRACHEOSTOMY;  Surgeon: Rozetta Nunnery, MD;  Location: [place];  Service: ENT;  Laterality: N/A;  . Colonoscopy with propofol N/A 08/04/2014    Procedure: COLONOSCOPY WITH PROPOFOL;  Surgeon: Manya Silvas, MD;  Location: [facility] ENDOSCOPY;  Service:  Endoscopy;  Laterality: N/A;  . Esophagogastroduodenoscopy (egd) with propofol  08/04/2014    Procedure: ESOPHAGOGASTRODUODENOSCOPY (EGD) WITH PROPOFOL;  Surgeon: Manya Silvas, MD;  Location: [facility] ENDOSCOPY;  Service: Endoscopy;;     Current Outpatient Rx  Name  Route  Sig  Dispense  Refill  . albuterol (PROVENTIL HFA;VENTOLIN HFA) 108 (90 BASE) MCG/ACT inhaler   Inhalation   Inhale 2 puffs into the lungs every 6 (six) hours as needed for wheezing.         . Calcium Carbonate-Vitamin D (CALCIUM + D PO)   Oral   Take 1 tablet by mouth daily.         . citalopram (CELEXA) 20 MG tablet   Oral   Take 1 tablet by mouth 2 (two) times daily.         . diclofenac (VOLTAREN) 75 MG EC tablet   Oral   Take 75 mg by mouth 2 (two) times daily.         Marland Kitchen EPINEPHrine (EPIPEN) 0.3 mg/0.3 mL DEVI   Intramuscular   Inject into the muscle once.         . fluticasone (FLONASE) 50 MCG/ACT nasal spray   Nasal   Place 2 sprays into the nose daily as needed.         . gabapentin (NEURONTIN) 600 MG tablet   Oral  Take 600 mg by mouth 3 (three) times daily.         . hydrochlorothiazide (HYDRODIURIL) 25 MG tablet   Oral   Take 1 tablet by mouth daily.         Marland Kitchen HYDROcodone-acetaminophen (NORCO/VICODIN) 5-325 MG per tablet   Oral   Take 1 tablet by mouth 2 (two) times daily.         Marland Kitchen levothyroxine (SYNTHROID, LEVOTHROID) 100 MCG tablet   Oral   Take 100 mcg by mouth daily.         Marland Kitchen loratadine (CLARITIN) 10 MG tablet   Oral   Take 10 mg by mouth daily.         . meclizine (ANTIVERT) 25 MG tablet   Oral   Take 1 tablet by mouth every 6 (six) hours.         . montelukast (SINGULAIR) 10 MG tablet   Oral   Take 10 mg by mouth at bedtime.         Marland Kitchen omeprazole (PRILOSEC) 40 MG capsule   Oral   Take 40 mg by mouth daily.         . ranitidine (ZANTAC) 150 MG capsule   Oral   Take 150 mg by mouth 2 (two) times daily.         Marland Kitchen SOTALOL AF 80 MG TABS    Oral   Take 1 tablet by mouth 2 (two) times daily.         . traZODone (DESYREL) 50 MG tablet   Oral   Take 1 tablet by mouth at bedtime as needed.            Allergies Furosemide; Tetanus toxoids; Qvar; Toprol xl; Mold extract; and Nitroglycerin   Family History  Problem Relation Age of Onset  . Skin telangiectasia Mother   . Lung cancer Maternal Grandmother   . Lung cancer Paternal Grandmother   . Breast cancer Maternal Grandmother     Social History Social History  Substance Use Topics  . Smoking status: Former Research scientist (life sciences)  . Smokeless tobacco: None  . Alcohol Use: No    Review of Systems  Constitutional:   No fever or chills. No weight changes Eyes:   No blurry vision or double vision.  ENT:   No sore throat. Bilateral ear pain Cardiovascular:   No chest pain. Respiratory:   No dyspnea or cough. Gastrointestinal:   Negative for abdominal pain, vomiting and diarrhea.  No BRBPR or melena. Genitourinary:   Negative for dysuria, urinary retention, bloody urine, or difficulty urinating. Musculoskeletal:   Negative for back pain. No joint swelling or pain. Skin:   Negative for rash. Neurological:   Negative for headaches, focal weakness or numbness. Psychiatric:  No anxiety or depression.   Endocrine:  No hot/cold intolerance, changes in energy, or sleep difficulty.  10-point ROS otherwise negative.  ____________________________________________   PHYSICAL EXAM:  VITAL SIGNS: ED Triage Vitals  Enc Vitals Group     BP 09/28/14 1915 123/73 mmHg     Pulse Rate 09/28/14 1915 70     Resp 09/28/14 1915 18     Temp 09/28/14 1915 98.7 F (37.1 C)     Temp Source 09/28/14 1915 Oral     SpO2 09/28/14 1915 96 %     Weight --      Height --      Head Cir --      Peak Flow --      Pain Score  09/28/14 1921 5     Pain Loc --      Pain Edu? --      Excl. in [place]? --      Constitutional:   Alert and oriented. Well appearing and in no distress. Eyes:   No scleral  icterus. No conjunctival pallor. PERRL. EOMI ENT   Head:   Normocephalic and atraumatic. Right TM unremarkable. Left TM inflamed and bulging. No perforation. External canals unremarkable.   Nose:   No congestion/rhinnorhea. No septal hematoma   Mouth/Throat:   MMM, no pharyngeal erythema. No peritonsillar mass. No uvula shift.   Neck:   No stridor. No SubQ emphysema. No meningismus. Hematological/Lymphatic/Immunilogical:   No cervical lymphadenopathy. Cardiovascular:   RRR. Normal and symmetric distal pulses are present in all extremities. No murmurs, rubs, or gallops. Respiratory:   Normal respiratory effort without tachypnea nor retractions. Breath sounds are clear and equal bilaterally. No wheezes/rales/rhonchi. Gastrointestinal:   Soft and nontender. No distention. There is no CVA tenderness.  No rebound, rigidity, or guarding. Genitourinary:   deferred Musculoskeletal:   Nontender with normal range of motion in all extremities. No joint effusions.  No lower extremity tenderness.  No edema. Neurologic:   Normal speech and language.  CN 2-10 normal. Motor grossly intact. No pronator drift.  Normal gait. No gross focal neurologic deficits are appreciated.  Skin:    Skin is warm, dry and intact. No rash noted.  No petechiae, purpura, or bullae. Psychiatric:   Mood and affect are normal. Speech and behavior are normal. Patient exhibits appropriate insight and judgment.  ____________________________________________    LABS (pertinent positives/negatives) (all labs ordered are listed, but only abnormal results are displayed) Labs Reviewed  BASIC METABOLIC PANEL - Abnormal; Notable for the following:    BUN 28 (*)    Creatinine, Ser 1.55 (*)    GFR calc non Af Amer 36 (*)    GFR calc Af Amer 42 (*)    All other components within normal limits  CBC - Abnormal; Notable for the following:    RBC 3.68 (*)    Hemoglobin 10.7 (*)    HCT 32.5 (*)    RDW 14.7 (*)    All other  components within normal limits  TROPONIN I   ____________________________________________   EKG  Interpreted by me Normal sinus rhythm rate of 73, left axis, first-degree AV block. Normal QRS ST segments and T waves.  ____________________________________________    RADIOLOGY  Chest x-ray unremarkable.  ____________________________________________   PROCEDURES   ____________________________________________   INITIAL IMPRESSION / ASSESSMENT AND PLAN / ED COURSE  Pertinent labs & imaging results that were available during my care of the patient were reviewed by me and considered in my medical decision making (see chart for details).  Patient presents with bilateral ear pain and progression of upper respiratory tract infection symptoms. Examination is consistent with left otitis media with no other complication at this time. She is well-appearing no acute distress nontoxic. Continue amoxicillin, Percocet times one now for symptom relief for tonight. Follow up with primary care. No evidence of intracranial abscess stroke and cranial hemorrhage or sepsis.     ____________________________________________   FINAL CLINICAL IMPRESSION(S) / ED DIAGNOSES  Final diagnoses:  Acute suppurative otitis media of left ear without spontaneous rupture of tympanic membrane, recurrence not specified      Carrie Mew, MD 09/28/14 2106

## 2014-09-28 NOTE — ED Notes (Signed)
Patient transported to X-ray 

## 2014-09-28 NOTE — ED Notes (Signed)
Pt states she has had left ear pain x 4 days. Seen at [facility] for the same yesterday, told she had ear infection, put on abx. States today she feels worse and now her right ear hurts, feels like her throat is swollen, and having pressure in the center of her chest that radiates under bilateral breast. Denies cough, "low grade fever" yesterday.

## 2014-12-27 ENCOUNTER — Encounter: Payer: Self-pay | Admitting: Medical Oncology

## 2014-12-27 ENCOUNTER — Emergency Department
Admission: EM | Admit: 2014-12-27 | Discharge: 2014-12-27 | Disposition: A | Discharge status: Home / Self Care | Payer: Medicare Other | Attending: Emergency Medicine | Admitting: Emergency Medicine

## 2014-12-27 ENCOUNTER — Emergency Department: Payer: Medicare Other

## 2014-12-27 DIAGNOSIS — E119 Type 2 diabetes mellitus without complications: Secondary | ICD-10-CM | POA: Diagnosis not present

## 2014-12-27 DIAGNOSIS — M5432 Sciatica, left side: Secondary | ICD-10-CM | POA: Insufficient documentation

## 2014-12-27 DIAGNOSIS — Z791 Long term (current) use of non-steroidal anti-inflammatories (NSAID): Secondary | ICD-10-CM | POA: Diagnosis not present

## 2014-12-27 DIAGNOSIS — M545 Low back pain, unspecified: Secondary | ICD-10-CM

## 2014-12-27 DIAGNOSIS — M5431 Sciatica, right side: Secondary | ICD-10-CM | POA: Diagnosis not present

## 2014-12-27 DIAGNOSIS — Z87891 Personal history of nicotine dependence: Secondary | ICD-10-CM | POA: Insufficient documentation

## 2014-12-27 DIAGNOSIS — Z79899 Other long term (current) drug therapy: Secondary | ICD-10-CM | POA: Insufficient documentation

## 2014-12-27 MED ORDER — TIZANIDINE HCL 4 MG PO TABS: 4.0000 mg | ORAL_TABLET | Freq: Four times a day (QID) | ORAL | Status: DC | PRN

## 2014-12-27 MED ORDER — OXYCODONE-ACETAMINOPHEN 5-325 MG PO TABS: 1.0000 | ORAL_TABLET | Freq: Four times a day (QID) | ORAL | Status: DC | PRN

## 2014-12-27 MED ORDER — OXYCODONE-ACETAMINOPHEN 5-325 MG PO TABS
1.0000 | ORAL_TABLET | Freq: Once | ORAL | Status: AC
Start: 2014-12-27 — End: 2014-12-27
  Administered 2014-12-27: 1 via ORAL
  Filled 2014-12-27: qty 1

## 2014-12-27 MED ORDER — DIAZEPAM 2 MG PO TABS
2.0000 mg | ORAL_TABLET | Freq: Once | ORAL | Status: AC
  Administered 2014-12-27: 2 mg via ORAL
  Filled 2014-12-27: qty 1

## 2014-12-27 NOTE — ED Provider Notes (Signed)
[facility] Emergency Department Provider Note ____________________________________________  Time seen: Approximately 3:37 PM  I have reviewed the triage vital signs and the nursing notes.   HISTORY  Chief Complaint Back Pain   HPI Theresa Leon is a 59 y.o. female who presents to the emergency department for evaluation of nontraumatic back pain that started 4 days ago.She has no history of back pain. She states the pain started on the lower mid back, moved around to the right side and to the groin, then    Past Medical History  Diagnosis Date  . Asthma 1975  . Diabetes mellitus without complication ([place]) 2774  . Heart disease   . 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 COPD (chronic obstructive pulmonary disease) ([place])   . OSA (obstructive sleep apnea)   . Diastolic CHF, acute on chronic (H[facility])   . Acute on chronic renal failure ([place])   . Anemia   . Diabetes ([place])   . H/O aortic valve replacement   . Morbid obesity ([place])   . Hypothyroid     Patient Active Problem List   Diagnosis Date Noted  . Dysphagia   . CHF (congestive heart failure) ([place])   . Acute respiratory failure with hypoxia ([place]) 01/26/2014  . Encounter for imaging study to confirm orogastric (OG) tube placement   . Respiratory failure ([place])   . 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 Back surgery      AS CHILD  . Phrenic nerve pacemaker implantation  2009  . Aortic valve relaced   2008  . Cholecystectomy  2007  . Breast surgery  September 25, 2011    intraductal papilloma of the left breast  . Tracheostomy tube placement N/A 02/04/2014    Procedure: TRACHEOSTOMY;  Surgeon: Rozetta Nunnery, MD;  Location: [place];  Service: ENT;  Laterality: N/A;  . Colonoscopy with propofol N/A 08/04/2014    Procedure: COLONOSCOPY WITH PROPOFOL;  Surgeon: Manya Silvas, MD;  Location: [facility]  ENDOSCOPY;  Service: Endoscopy;  Laterality: N/A;  . Esophagogastroduodenoscopy (egd) with propofol  08/04/2014    Procedure: ESOPHAGOGASTRODUODENOSCOPY (EGD) WITH PROPOFOL;  Surgeon: Manya Silvas, MD;  Location: [facility] ENDOSCOPY;  Service: Endoscopy;;    Current Outpatient Rx  Name  Route  Sig  Dispense  Refill  . albuterol (PROVENTIL HFA;VENTOLIN HFA) 108 (90 BASE) MCG/ACT inhaler   Inhalation   Inhale 2 puffs into the lungs every 6 (six) hours as needed for wheezing.         . Calcium Carbonate-Vitamin D (CALCIUM + D PO)   Oral   Take 1 tablet by mouth daily.         . citalopram (CELEXA) 20 MG tablet   Oral   Take 1 tablet by mouth 2 (two) times daily.         . diclofenac (VOLTAREN) 75 MG EC tablet   Oral   Take 75 mg by mouth 2 (two) times daily.         Marland Kitchen EPINEPHrine (EPIPEN) 0.3 mg/0.3 mL DEVI   Intramuscular   Inject into the muscle once.         . fluticasone (FLONASE) 50 MCG/ACT nasal spray   Nasal   Place 2 sprays into the nose daily as needed.         . gabapentin (NEURONTIN) 600 MG tablet   Oral  Take 600 mg by mouth 3 (three) times daily.         . hydrochlorothiazide (HYDRODIURIL) 25 MG tablet   Oral   Take 1 tablet by mouth daily.         Marland Kitchen HYDROcodone-acetaminophen (NORCO/VICODIN) 5-325 MG per tablet   Oral   Take 1 tablet by mouth 2 (two) times daily.         Marland Kitchen levothyroxine (SYNTHROID, LEVOTHROID) 100 MCG tablet   Oral   Take 100 mcg by mouth daily.         Marland Kitchen loratadine (CLARITIN) 10 MG tablet   Oral   Take 10 mg by mouth daily.         . meclizine (ANTIVERT) 25 MG tablet   Oral   Take 1 tablet by mouth every 6 (six) hours.         . montelukast (SINGULAIR) 10 MG tablet   Oral   Take 10 mg by mouth at bedtime.         Marland Kitchen omeprazole (PRILOSEC) 40 MG capsule   Oral   Take 40 mg by mouth daily.         Marland Kitchen oxyCODONE-acetaminophen (ROXICET) 5-325 MG tablet   Oral   Take 1 tablet by mouth every 6 (six) hours as  needed.   12 tablet   0   . ranitidine (ZANTAC) 150 MG capsule   Oral   Take 150 mg by mouth 2 (two) times daily.         Marland Kitchen SOTALOL AF 80 MG TABS   Oral   Take 1 tablet by mouth 2 (two) times daily.         Marland Kitchen tiZANidine (ZANAFLEX) 4 MG tablet   Oral   Take 1 tablet (4 mg total) by mouth every 6 (six) hours as needed for muscle spasms.   30 tablet   0   . traZODone (DESYREL) 50 MG tablet   Oral   Take 1 tablet by mouth at bedtime as needed.           Allergies Furosemide; Tetanus toxoids; Qvar; Toprol xl; Mold extract; and Nitroglycerin  Family History  Problem Relation Age of Onset  . Skin telangiectasia Mother   . Lung cancer Maternal Grandmother   . Lung cancer Paternal Grandmother   . Breast cancer Maternal Grandmother     Social History Social History  Substance Use Topics  . Smoking status: Former Research scientist (life sciences)  . Smokeless tobacco: None  . Alcohol Use: No    Review of Systems Constitutional: No recent illness. Eyes: No visual changes. ENT: No sore throat. Cardiovascular: Denies chest pain or palpitations. Respiratory: Denies shortness of breath. Gastrointestinal: No abdominal pain.  Genitourinary: Negative for dysuria. Musculoskeletal: Pain in lower back with radiation into both hips. Skin: Negative for rash. Neurological: Negative for headaches, focal weakness or numbness. 10-point ROS otherwise negative.  ____________________________________________   PHYSICAL EXAM:  VITAL SIGNS: ED Triage Vitals  Enc Vitals Group     BP 12/27/14 1436 127/51 mmHg     Pulse Rate 12/27/14 1436 78     Resp 12/27/14 1436 18     Temp 12/27/14 1436 97 F (36.1 C)     Temp Source 12/27/14 1436 Oral     SpO2 12/27/14 1436 97 %     Weight 12/27/14 1436 320 lb (145.151 kg)     Height 12/27/14 1436 5' 7"  (1.702 m)     Head Cir --      Peak  Flow --      Pain Score 12/27/14 1438 8     Pain Loc --      Pain Edu? --      Excl. in [place]? --     Constitutional:  Alert and oriented. Well appearing and in no acute distress. Eyes: Conjunctivae are normal. EOMI. Head: Atraumatic. Nose: No congestion/rhinnorhea. Neck: No stridor.  Respiratory: Normal respiratory effort.   Musculoskeletal: Straight leg raise positive at approximately 40 on the left. Straight leg raise negative on the right. Neurologic:  Normal speech and language. No gross focal neurologic deficits are appreciated. Speech is normal. No gait instability. Skin:  Skin is warm, dry and intact. Atraumatic. Psychiatric: Mood and affect are normal. Speech and behavior are normal.  ____________________________________________   LABS (all labs ordered are listed, but only abnormal results are displayed)  Labs Reviewed - No data to display ____________________________________________  RADIOLOGY  Multi level degenerative disc disease per radiology. ____________________________________________   PROCEDURES  Procedure(s) performed: None   ____________________________________________   INITIAL IMPRESSION / ASSESSMENT AND PLAN / ED COURSE  Pertinent labs & imaging results that were available during my care of the patient were reviewed by me and considered in my medical decision making (see chart for details).  Pain decreased to 4/10. Patient was encouraged to follow up with orthopedics for symptoms that are not improving over the next 5-7 days. She was advised to take the medications as prescribed. She was advised to return to the emergency department for symptoms that change or worsen if she is unable to schedule an appointment with primary care provider or specialist. ____________________________________________   FINAL CLINICAL IMPRESSION(S) / ED DIAGNOSES  Final diagnoses:  Acute low back pain  Bilateral sciatica       Victorino Dike, FNP 12/27/14 1825  Earleen Newport, MD 12/27/14 [phone]

## 2014-12-27 NOTE — Discharge Instructions (Signed)
Follow up with the orthopedic doctor if you are not improving over the next 5-7 days. Return to the ER for symptoms that change or worsen if you are unable to schedule an appointment.

## 2014-12-27 NOTE — ED Notes (Signed)
Low back pain with numbness in right hip, and numbness in right leg

## 2014-12-27 NOTE — ED Notes (Signed)
Left lower back pain with radiation of pain into left hip and down leg x 4 days. Denies injury.

## 2015-01-05 ENCOUNTER — Emergency Department: Payer: Medicare Other

## 2015-01-05 ENCOUNTER — Emergency Department
Admission: EM | Admit: 2015-01-05 | Discharge: 2015-01-05 | Disposition: A | Discharge status: Home / Self Care | Payer: Medicare Other | Attending: Emergency Medicine | Admitting: Emergency Medicine

## 2015-01-05 ENCOUNTER — Encounter: Payer: Self-pay | Admitting: Emergency Medicine

## 2015-01-05 DIAGNOSIS — Y998 Other external cause status: Secondary | ICD-10-CM | POA: Insufficient documentation

## 2015-01-05 DIAGNOSIS — M5432 Sciatica, left side: Secondary | ICD-10-CM | POA: Diagnosis not present

## 2015-01-05 DIAGNOSIS — Z791 Long term (current) use of non-steroidal anti-inflammatories (NSAID): Secondary | ICD-10-CM | POA: Diagnosis not present

## 2015-01-05 DIAGNOSIS — E119 Type 2 diabetes mellitus without complications: Secondary | ICD-10-CM | POA: Diagnosis not present

## 2015-01-05 DIAGNOSIS — Z87891 Personal history of nicotine dependence: Secondary | ICD-10-CM | POA: Insufficient documentation

## 2015-01-05 DIAGNOSIS — Y9389 Activity, other specified: Secondary | ICD-10-CM | POA: Insufficient documentation

## 2015-01-05 DIAGNOSIS — S93602A Unspecified sprain of left foot, initial encounter: Secondary | ICD-10-CM | POA: Diagnosis not present

## 2015-01-05 DIAGNOSIS — X58XXXA Exposure to other specified factors, initial encounter: Secondary | ICD-10-CM | POA: Diagnosis not present

## 2015-01-05 DIAGNOSIS — Z79899 Other long term (current) drug therapy: Secondary | ICD-10-CM | POA: Diagnosis not present

## 2015-01-05 DIAGNOSIS — M545 Low back pain: Secondary | ICD-10-CM | POA: Diagnosis present

## 2015-01-05 DIAGNOSIS — Y9289 Other specified places as the place of occurrence of the external cause: Secondary | ICD-10-CM | POA: Insufficient documentation

## 2015-01-05 MED ORDER — METHYLPREDNISOLONE SODIUM SUCC 40 MG IJ SOLR
40.0000 mg | Freq: Once | INTRAMUSCULAR | Status: AC
  Administered 2015-01-05: 40 mg via INTRAMUSCULAR
  Filled 2015-01-05: qty 1

## 2015-01-05 MED ORDER — CYCLOBENZAPRINE HCL 10 MG PO TABS: 10.0000 mg | ORAL_TABLET | Freq: Three times a day (TID) | ORAL | Status: DC | PRN

## 2015-01-05 NOTE — Discharge Instructions (Signed)
Sciatica Sciatica is pain, weakness, numbness, or tingling along the path of the sciatic nerve. The nerve starts in the lower back and runs down the back of each leg. The nerve controls the muscles in the lower leg and in the back of the knee, while also providing sensation to the back of the thigh, lower leg, and the sole of your foot. Sciatica is a symptom of another medical condition. For instance, nerve damage or certain conditions, such as a herniated disk or bone spur on the spine, pinch or put pressure on the sciatic nerve. This causes the pain, weakness, or other sensations normally associated with sciatica. Generally, sciatica only affects one side of the body. CAUSES  1. Herniated or slipped disc. 2. Degenerative disk disease. 3. A pain disorder involving the narrow muscle in the buttocks (piriformis syndrome). 4. Pelvic injury or fracture. 5. Pregnancy. 6. Tumor (rare). SYMPTOMS  Symptoms can vary from mild to very severe. The symptoms usually travel from the low back to the buttocks and down the back of the leg. Symptoms can include: 1. Mild tingling or dull aches in the lower back, leg, or hip. 2. Numbness in the back of the calf or sole of the foot. 3. Burning sensations in the lower back, leg, or hip. 4. Sharp pains in the lower back, leg, or hip. 5. Leg weakness. 6. Severe back pain inhibiting movement. These symptoms may get worse with coughing, sneezing, laughing, or prolonged sitting or standing. Also, being overweight may worsen symptoms. DIAGNOSIS  Your caregiver will perform a physical exam to look for common symptoms of sciatica. He or she may ask you to do certain movements or activities that would trigger sciatic nerve pain. Other tests may be performed to find the cause of the sciatica. These may include: 1. Blood tests. 2. X-rays. 3. Imaging tests, such as an MRI or CT scan. TREATMENT  Treatment is directed at the cause of the sciatic pain. Sometimes, treatment is  not necessary and the pain and discomfort goes away on its own. If treatment is needed, your caregiver may suggest: 1. Over-the-counter medicines to relieve pain. 2. Prescription medicines, such as anti-inflammatory medicine, muscle relaxants, or narcotics. 3. Applying heat or ice to the painful area. 4. Steroid injections to lessen pain, irritation, and inflammation around the nerve. 5. Reducing activity during periods of pain. 6. Exercising and stretching to strengthen your abdomen and improve flexibility of your spine. Your caregiver may suggest losing weight if the extra weight makes the back pain worse. 7. Physical therapy. 8. Surgery to eliminate what is pressing or pinching the nerve, such as a bone spur or part of a herniated disk. HOME CARE INSTRUCTIONS  1. Only take over-the-counter or prescription medicines for pain or discomfort as directed by your caregiver. 2. Apply ice to the affected area for 20 minutes, 3-4 times a day for the first 48-72 hours. Then try heat in the same way. 3. Exercise, stretch, or perform your usual activities if these do not aggravate your pain. 4. Attend physical therapy sessions as directed by your caregiver. 5. Keep all follow-up appointments as directed by your caregiver. 6. Do not wear high heels or shoes that do not provide proper support. 7. Check your mattress to see if it is too soft. A firm mattress may lessen your pain and discomfort. SEEK IMMEDIATE MEDICAL CARE IF:  1. You lose control of your bowel or bladder (incontinence). 2. You have increasing weakness in the lower back, pelvis, buttocks,  or legs. 3. You have redness or swelling of your back. 4. You have a burning sensation when you urinate. 5. You have pain that gets worse when you lie down or awakens you at night. 6. Your pain is worse than you have experienced in the past. 7. Your pain is lasting longer than 4 weeks. 8. You are suddenly losing weight without reason. MAKE SURE  YOU: 1. Understand these instructions. 2. Will watch your condition. 3. Will get help right away if you are not doing well or get worse.   This information is not intended to replace advice given to you by your health care provider. Make sure you discuss any questions you have with your health care provider.   Document Released: 01/08/2001 Document Revised: 10/05/2014 Document Reviewed: 05/26/2011 Elsevier Interactive Patient Education 2016 [employer].   Try a muscle relaxant as tolerated. Continue diclofenac and oxycodone. Follow up with your physician or Dr. Rudene Christians, the orthopedist. Back Exercises If you have pain in your back, do these exercises 2-3 times each day or as told by your doctor. When the pain goes away, do the exercises once each day, but repeat the steps more times for each exercise (do more repetitions). If you do not have pain in your back, do these exercises once each day or as told by your doctor. EXERCISES Single Knee to Chest Do these steps 3-5 times in a row for each leg: 7. Lie on your back on a firm bed or the floor with your legs stretched out. 8. Bring one knee to your chest. 9. Hold your knee to your chest by grabbing your knee or thigh. 10. Pull on your knee until you feel a gentle stretch in your lower back. 11. Keep doing the stretch for 10-30 seconds. 12. Slowly let go of your leg and straighten it. Pelvic Tilt Do these steps 5-10 times in a row: 7. Lie on your back on a firm bed or the floor with your legs stretched out. 8. Bend your knees so they point up to the ceiling. Your feet should be flat on the floor. 9. Tighten your lower belly (abdomen) muscles to press your lower back against the floor. This will make your tailbone point up to the ceiling instead of pointing down to your feet or the floor. 10. Stay in this position for 5-10 seconds while you gently tighten your muscles and breathe evenly. Cat-Cow Do these steps until your lower back bends  more easily: 4. Get on your hands and knees on a firm surface. Keep your hands under your shoulders, and keep your knees under your hips. You may put padding under your knees. 5. Let your head hang down, and make your tailbone point down to the floor so your lower back is round like the back of a cat. 6. Stay in this position for 5 seconds. 7. Slowly lift your head and make your tailbone point up to the ceiling so your back hangs low (sags) like the back of a cow. 8. Stay in this position for 5 seconds. Press-Ups Do these steps 5-10 times in a row: 9. Lie on your belly (face-down) on the floor. 10. Place your hands near your head, about shoulder-width apart. 11. While you keep your back relaxed and keep your hips on the floor, slowly straighten your arms to raise the top half of your body and lift your shoulders. Do not use your back muscles. To make yourself more comfortable, you may change where you  place your hands. 12. Stay in this position for 5 seconds. 13. Slowly return to lying flat on the floor. Bridges Do these steps 10 times in a row: 8. Lie on your back on a firm surface. 9. Bend your knees so they point up to the ceiling. Your feet should be flat on the floor. 10. Tighten your butt muscles and lift your butt off of the floor until your waist is almost as high as your knees. If you do not feel the muscles working in your butt and the back of your thighs, slide your feet 1-2 inches farther away from your butt. 11. Stay in this position for 3-5 seconds. 12. Slowly lower your butt to the floor, and let your butt muscles relax. If this exercise is too easy, try doing it with your arms crossed over your chest. Belly Crunches Do these steps 5-10 times in a row: 9. Lie on your back on a firm bed or the floor with your legs stretched out. 10. Bend your knees so they point up to the ceiling. Your feet should be flat on the floor. 78. Cross your arms over your chest. 12. Tip your chin a  little bit toward your chest but do not bend your neck. 68. Tighten your belly muscles and slowly raise your chest just enough to lift your shoulder blades a tiny bit off of the floor. 14. Slowly lower your chest and your head to the floor. Back Lifts Do these steps 5-10 times in a row: 4. Lie on your belly (face-down) with your arms at your sides, and rest your forehead on the floor. 5. Tighten the muscles in your legs and your butt. 6. Slowly lift your chest off of the floor while you keep your hips on the floor. Keep the back of your head in line with the curve in your back. Look at the floor while you do this. 7. Stay in this position for 3-5 seconds. 8. Slowly lower your chest and your face to the floor. GET HELP IF:  Your back pain gets a lot worse when you do an exercise.  Your back pain does not lessen 2 hours after you exercise. If you have any of these problems, stop doing the exercises. Do not do them again unless your doctor says it is okay. GET HELP RIGHT AWAY IF:  You have sudden, very bad back pain. If this happens, stop doing the exercises. Do not do them again unless your doctor says it is okay.   This information is not intended to replace advice given to you by your health care provider. Make sure you discuss any questions you have with your health care provider.   Document Released: 02/16/2010 Document Revised: 10/05/2014 Document Reviewed: 03/10/2014 Elsevier Interactive Patient Education [employer].

## 2015-01-05 NOTE — ED Notes (Signed)
C/o lower back pain that radiates down left hip and left leg, states she was seen in ED last for the same and given pain medication, states she ran out of the pain medication and then she started taking the xanaflex yesterday, states she started having hallucinations and felt "crazy", states the medication did not relieve the pain

## 2015-01-05 NOTE — ED Provider Notes (Signed)
[facility] Emergency Department Provider Note  ____________________________________________  Time seen: Approximately 7:57 PM  I have reviewed the triage vital signs and the nursing notes.   HISTORY  Chief Complaint Leg Pain and Back Pain    HPI Theresa Leon is a 58 y.o. female multiple medical problems who presents with persistent low back pain radiating down her left leg. She was seen November 29 for the same complaint.  Lumbar x-rays revealing multilevel degenerative disease. She has no prior history of back surgery. Her pain radiates down the left buttock towards the left calf. She also has point tenderness pain on the distal left foot. No injury. She has tried diclofenac, Zanaflex and takes oxycodone twice a day. She has no appetite. She denies abdominal pain, but says her bowels are sluggish. No fevers and chills or cough. She sees Dr. Quay Burow at [facility].   Past Medical History  Diagnosis Date  . Asthma 1975  . Diabetes mellitus without complication ([place]) 0354  . Heart disease   . 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 COPD (chronic obstructive pulmonary disease) ([place])   . OSA (obstructive sleep apnea)   . Diastolic CHF, acute on chronic (HCC)   . Acute on chronic renal failure ([place])   . Anemia   . Diabetes ([place])   . H/O aortic valve replacement   . Morbid obesity ([place])   . Hypothyroid     Patient Active Problem List   Diagnosis Date Noted  . Dysphagia   . CHF (congestive heart failure) ([place])   . Acute respiratory failure with hypoxia ([place]) 01/26/2014  . Encounter for imaging study to confirm orogastric (OG) tube placement   . Respiratory failure ([place])   . 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 Back surgery      AS CHILD  . Phrenic nerve pacemaker implantation  2009  . Aortic valve relaced   2008  . Cholecystectomy  2007  .  Breast surgery  September 25, 2011    intraductal papilloma of the left breast  . Tracheostomy tube placement N/A 02/04/2014    Procedure: TRACHEOSTOMY;  Surgeon: Rozetta Nunnery, MD;  Location: [facility];  Service: ENT;  Laterality: N/A;  . Colonoscopy with propofol N/A 08/04/2014    Procedure: COLONOSCOPY WITH PROPOFOL;  Surgeon: Manya Silvas, MD;  Location: [facility] ENDOSCOPY;  Service: Endoscopy;  Laterality: N/A;  . Esophagogastroduodenoscopy (egd) with propofol  08/04/2014    Procedure: ESOPHAGOGASTRODUODENOSCOPY (EGD) WITH PROPOFOL;  Surgeon: Manya Silvas, MD;  Location: [facility] ENDOSCOPY;  Service: Endoscopy;;    Current Outpatient Rx  Name  Route  Sig  Dispense  Refill  . albuterol (PROVENTIL HFA;VENTOLIN HFA) 108 (90 BASE) MCG/ACT inhaler   Inhalation   Inhale 2 puffs into the lungs every 6 (six) hours as needed for wheezing.         . Calcium Carbonate-Vitamin D (CALCIUM + D PO)   Oral   Take 1 tablet by mouth daily.         . citalopram (CELEXA) 20 MG tablet   Oral   Take 1 tablet by mouth 2 (two) times daily.         . cyclobenzaprine (FLEXERIL) 10 MG tablet   Oral   Take 1 tablet (10 mg total) by mouth every 8 (eight) hours as needed for muscle spasms.   [place]  tablet   1   . diclofenac (VOLTAREN) 75 MG EC tablet   Oral   Take 75 mg by mouth 2 (two) times daily.         Marland Kitchen EPINEPHrine (EPIPEN) 0.3 mg/0.3 mL DEVI   Intramuscular   Inject into the muscle once.         . fluticasone (FLONASE) 50 MCG/ACT nasal spray   Nasal   Place 2 sprays into the nose daily as needed.         . gabapentin (NEURONTIN) 600 MG tablet   Oral   Take 600 mg by mouth 3 (three) times daily.         . hydrochlorothiazide (HYDRODIURIL) 25 MG tablet   Oral   Take 1 tablet by mouth daily.         Marland Kitchen HYDROcodone-acetaminophen (NORCO/VICODIN) 5-325 MG per tablet   Oral   Take 1 tablet by mouth 2 (two) times daily.         Marland Kitchen levothyroxine (SYNTHROID, LEVOTHROID) 100 MCG  tablet   Oral   Take 100 mcg by mouth daily.         Marland Kitchen loratadine (CLARITIN) 10 MG tablet   Oral   Take 10 mg by mouth daily.         . meclizine (ANTIVERT) 25 MG tablet   Oral   Take 1 tablet by mouth every 6 (six) hours.         . montelukast (SINGULAIR) 10 MG tablet   Oral   Take 10 mg by mouth at bedtime.         Marland Kitchen omeprazole (PRILOSEC) 40 MG capsule   Oral   Take 40 mg by mouth daily.         Marland Kitchen oxyCODONE-acetaminophen (ROXICET) 5-325 MG tablet   Oral   Take 1 tablet by mouth every 6 (six) hours as needed.   12 tablet   0   . ranitidine (ZANTAC) 150 MG capsule   Oral   Take 150 mg by mouth 2 (two) times daily.         Marland Kitchen SOTALOL AF 80 MG TABS   Oral   Take 1 tablet by mouth 2 (two) times daily.         Marland Kitchen tiZANidine (ZANAFLEX) 4 MG tablet   Oral   Take 1 tablet (4 mg total) by mouth every 6 (six) hours as needed for muscle spasms.   30 tablet   0   . traZODone (DESYREL) 50 MG tablet   Oral   Take 1 tablet by mouth at bedtime as needed.           Allergies Furosemide; Tetanus toxoids; Qvar; Toprol xl; Mold extract; and Nitroglycerin  Family History  Problem Relation Age of Onset  . Skin telangiectasia Mother   . Lung cancer Maternal Grandmother   . Lung cancer Paternal Grandmother   . Breast cancer Maternal Grandmother     Social History Social History  Substance Use Topics  . Smoking status: Former Research scientist (life sciences)  . Smokeless tobacco: None  . Alcohol Use: No    Review of Systems Constitutional: No fever/chills Eyes: No visual changes. ENT: No sore throat. Cardiovascular: Denies chest pain. Respiratory: Denies shortness of breath. Gastrointestinal: No abdominal pain.  No nausea, no vomiting.  No diarrhea. Genitourinary: Negative for dysuria. Musculoskeletal: per HPI Skin: Negative for rash. Neurological: Negative for headaches, focal weakness or numbness. 10-point ROS otherwise  negative.  ____________________________________________   PHYSICAL EXAM:  VITAL  SIGNS: ED Triage Vitals  Enc Vitals Group     BP 01/05/15 1847 117/59 mmHg     Pulse Rate 01/05/15 1845 61     Resp 01/05/15 1845 18     Temp 01/05/15 1845 99.2 F (37.3 C)     Temp Source 01/05/15 1845 Oral     SpO2 01/05/15 1848 93 %     Weight 01/05/15 1845 350 lb (158.759 kg)     Height 01/05/15 1845 5' 7"  (1.702 m)     Head Cir --      Peak Flow --      Pain Score 01/05/15 1847 8     Pain Loc --      Pain Edu? --      Excl. in [place]? --     Constitutional: Alert and oriented. Well appearing and in no acute distress. Eyes: Conjunctivae are normal. EOMI. Ears:  Clear with normal landmarks. No erythema. Head: Atraumatic. Nose: No congestion/rhinnorhea. Mouth/Throat: Mucous membranes are moist.  Oropharynx non-erythematous. No lesions. Neck:  Supple.  No adenopathy.   Cardiovascular: Normal rate, regular rhythm. Grossly normal heart sounds.  Good peripheral circulation. Respiratory: Normal respiratory effort.  No retractions. Lungs CTAB. Gastrointestinal: Soft and nontender. No distention. No abdominal bruits. No CVA tenderness. Musculoskeletal:   Non tender over the lumbar spine and Tender over the left paraspinal muscles.  rom intact.  Neg SLR to LLE.  Left foot: point tender over the distal 3rd metatarpel bone without swelling.   Neurologic:  Normal speech and language. No gross focal neurologic deficits are appreciated. No gait instability. Skin:  Skin is warm, dry and intact. No rash noted. Psychiatric: Mood and affect are normal. Speech and behavior are normal.  ____________________________________________   LABS (all labs ordered are listed, but only abnormal results are displayed)  Labs Reviewed - No data to display ____________________________________________  EKG   ____________________________________________  RADIOLOGY  CLINICAL DATA: Acute low back pain, BILATERAL  radiation of pain to legs, no specific injury, has fallen multiple times due to bed knees  EXAM: LUMBAR SPINE - 2-3 VIEW  COMPARISON: None  FINDINGS: Diffuse osseous demineralization.  Five non-rib-bearing lumbar vertebra.  Vertebral body heights maintained.  Multilevel disc space narrowing and endplate spur formation.  Vacuum phenomenon at multiple levels.  No acute fracture, subluxation, or bone destruction.  No gross evidence of spondylolysis on AP/ lateral exam.  SI joints symmetric.  IMPRESSION: Osseous demineralization with multilevel degenerative disc disease changes.  No definite acute bony abnormalities.   Electronically Signed  By: Lavonia Dana M.D.  On: 12/27/2014 17:37   CLINICAL DATA: Left foot pain for 1 week. Erythema and pain at the third metatarsal phalangeal joint. No known injury.  EXAM: LEFT FOOT - COMPLETE 3+ VIEW  COMPARISON: None.  FINDINGS: There are advanced arthropathic changes at the first metatarsal phalangeal joint with a mild hallux valgus deformity. There are possible small periarticular erosions as can be seen with gout. The additional metatarsal phalangeal joints appear normal. The alignment is normal at the Lisfranc joint. There are mild midfoot degenerative changes and mild calcaneal spurring. There is probable calcification within the distal Achilles tendon.  IMPRESSION: No acute osseous findings or abnormality at the third MTP joint demonstrated. First MTP arthropathic changes, potentially gout. Distal Achilles tendon calcification.   Electronically Signed  By: Richardean Sale M.D.  On: 01/05/2015 20:21 ________________________   ____________________   PROCEDURES  Procedure(s) performed: None  Critical Care performed: No  ____________________________________________   INITIAL  IMPRESSION / ASSESSMENT AND PLAN / ED COURSE  Pertinent labs & imaging results that were available  during my care of the patient were reviewed by me and considered in my medical decision making (see chart for details).  59 year old with multiple medical problems who presents with persistent left sciatica. She also has pain to the left foot.  She was seen last week, 11/29, but has not seen improvement. X-rays as above. She is resistant to trying prednisone because of possible allergy. She is given a one-time injection of Solu-Medrol 40 mg and a prescription for Flexeril. She will follow-up with her primary physician next week, or the orthopedist. ____________________________________________   FINAL CLINICAL IMPRESSION(S) / ED DIAGNOSES  Final diagnoses:  Sciatica of left side  Foot sprain, left, initial encounter      Mortimer Fries, PA-C 01/05/15 2056  Hinda Kehr, MD 01/06/15 [id number]

## 2015-01-05 NOTE — ED Notes (Signed)
Pt here with left lower back pain, left leg pain, was seen here a week ago for the same, diagnosed with sciatica.

## 2015-03-01 ENCOUNTER — Encounter: Payer: Self-pay | Admitting: *Deleted

## 2015-03-01 ENCOUNTER — Inpatient Hospital Stay
Admission: EM | Admit: 2015-03-01 | Discharge: 2015-03-05 | DRG: 378 | Disposition: A | Discharge status: Home / Self Care | Payer: Medicare Other | Attending: Internal Medicine | Admitting: Internal Medicine

## 2015-03-01 DIAGNOSIS — E785 Hyperlipidemia, unspecified: Secondary | ICD-10-CM | POA: Diagnosis present

## 2015-03-01 DIAGNOSIS — Z9889 Other specified postprocedural states: Secondary | ICD-10-CM

## 2015-03-01 DIAGNOSIS — J449 Chronic obstructive pulmonary disease, unspecified: Secondary | ICD-10-CM | POA: Diagnosis present

## 2015-03-01 DIAGNOSIS — K319 Disease of stomach and duodenum, unspecified: Secondary | ICD-10-CM | POA: Diagnosis present

## 2015-03-01 DIAGNOSIS — E1122 Type 2 diabetes mellitus with diabetic chronic kidney disease: Secondary | ICD-10-CM | POA: Diagnosis present

## 2015-03-01 DIAGNOSIS — Z952 Presence of prosthetic heart valve: Secondary | ICD-10-CM

## 2015-03-01 DIAGNOSIS — I5032 Chronic diastolic (congestive) heart failure: Secondary | ICD-10-CM | POA: Diagnosis present

## 2015-03-01 DIAGNOSIS — T39395A Adverse effect of other nonsteroidal anti-inflammatory drugs [NSAID], initial encounter: Secondary | ICD-10-CM | POA: Diagnosis present

## 2015-03-01 DIAGNOSIS — D6959 Other secondary thrombocytopenia: Secondary | ICD-10-CM | POA: Diagnosis present

## 2015-03-01 DIAGNOSIS — G4733 Obstructive sleep apnea (adult) (pediatric): Secondary | ICD-10-CM | POA: Diagnosis present

## 2015-03-01 DIAGNOSIS — K219 Gastro-esophageal reflux disease without esophagitis: Secondary | ICD-10-CM | POA: Diagnosis present

## 2015-03-01 DIAGNOSIS — Z7984 Long term (current) use of oral hypoglycemic drugs: Secondary | ICD-10-CM | POA: Diagnosis not present

## 2015-03-01 DIAGNOSIS — R42 Dizziness and giddiness: Secondary | ICD-10-CM | POA: Diagnosis present

## 2015-03-01 DIAGNOSIS — Z91048 Other nonmedicinal substance allergy status: Secondary | ICD-10-CM | POA: Diagnosis not present

## 2015-03-01 DIAGNOSIS — Z791 Long term (current) use of non-steroidal anti-inflammatories (NSAID): Secondary | ICD-10-CM | POA: Diagnosis not present

## 2015-03-01 DIAGNOSIS — Z6841 Body Mass Index (BMI) 40.0 and over, adult: Secondary | ICD-10-CM

## 2015-03-01 DIAGNOSIS — E039 Hypothyroidism, unspecified: Secondary | ICD-10-CM | POA: Diagnosis present

## 2015-03-01 DIAGNOSIS — D62 Acute posthemorrhagic anemia: Secondary | ICD-10-CM | POA: Diagnosis present

## 2015-03-01 DIAGNOSIS — K2971 Gastritis, unspecified, with bleeding: Principal | ICD-10-CM | POA: Diagnosis present

## 2015-03-01 DIAGNOSIS — Z9989 Dependence on other enabling machines and devices: Secondary | ICD-10-CM

## 2015-03-01 DIAGNOSIS — Z887 Allergy status to serum and vaccine status: Secondary | ICD-10-CM

## 2015-03-01 DIAGNOSIS — Z888 Allergy status to other drugs, medicaments and biological substances status: Secondary | ICD-10-CM

## 2015-03-01 DIAGNOSIS — K2960 Other gastritis without bleeding: Secondary | ICD-10-CM

## 2015-03-01 DIAGNOSIS — D500 Iron deficiency anemia secondary to blood loss (chronic): Secondary | ICD-10-CM | POA: Diagnosis present

## 2015-03-01 DIAGNOSIS — Z87891 Personal history of nicotine dependence: Secondary | ICD-10-CM

## 2015-03-01 DIAGNOSIS — E119 Type 2 diabetes mellitus without complications: Secondary | ICD-10-CM

## 2015-03-01 DIAGNOSIS — D649 Anemia, unspecified: Secondary | ICD-10-CM

## 2015-03-01 DIAGNOSIS — N189 Chronic kidney disease, unspecified: Secondary | ICD-10-CM | POA: Diagnosis present

## 2015-03-01 LAB — URINALYSIS COMPLETE WITH MICROSCOPIC (ARMC ONLY)
BILIRUBIN URINE: NEGATIVE
GLUCOSE, UA: NEGATIVE mg/dL
HGB URINE DIPSTICK: NEGATIVE
KETONES UR: NEGATIVE mg/dL
NITRITE: NEGATIVE
Protein, ur: NEGATIVE mg/dL
Specific Gravity, Urine: 1.023 (ref 1.005–1.030)
pH: 5 (ref 5.0–8.0)

## 2015-03-01 LAB — CBC
HEMATOCRIT: 23.9 % — AB (ref 35.0–47.0)
HEMOGLOBIN: 7 g/dL — AB (ref 12.0–16.0)
MCH: 20.6 pg — AB (ref 26.0–34.0)
MCHC: 29.4 g/dL — AB (ref 32.0–36.0)
MCV: 69.9 fL — AB (ref 80.0–100.0)
Platelets: 131 10*3/uL — ABNORMAL LOW (ref 150–440)
RBC: 3.42 MIL/uL — AB (ref 3.80–5.20)
RDW: 17.9 % — ABNORMAL HIGH (ref 11.5–14.5)
WBC: 7.3 10*3/uL (ref 3.6–11.0)

## 2015-03-01 LAB — ABO/RH: ABO/RH(D): A POS

## 2015-03-01 LAB — PREPARE RBC (CROSSMATCH)

## 2015-03-01 LAB — BASIC METABOLIC PANEL
ANION GAP: 4 — AB (ref 5–15)
BUN: 18 mg/dL (ref 6–20)
CALCIUM: 9 mg/dL (ref 8.9–10.3)
CO2: 32 mmol/L (ref 22–32)
CREATININE: 1.07 mg/dL — AB (ref 0.44–1.00)
Chloride: 103 mmol/L (ref 101–111)
GFR calc Af Amer: >60 mL/min (ref >60)
GFR calc non Af Amer: 56 mL/min — ABNORMAL LOW (ref >60)
GLUCOSE: 141 mg/dL — AB (ref 65–99)
Potassium: 4.1 mmol/L (ref 3.5–5.1)
Sodium: 139 mmol/L (ref 135–145)

## 2015-03-01 MED ORDER — SODIUM CHLORIDE 0.9 % IV SOLN
8.0000 mg/h | INTRAVENOUS | Status: AC
  Administered 2015-03-02 – 2015-03-04 (×2): 8 mg/h via INTRAVENOUS
  Filled 2015-03-01 (×5): qty 80

## 2015-03-01 MED ORDER — PANTOPRAZOLE SODIUM 40 MG IV SOLR
80.0000 mg | Freq: Once | INTRAVENOUS | Status: AC
  Administered 2015-03-02: 80 mg via INTRAVENOUS
  Filled 2015-03-01 (×2): qty 80

## 2015-03-01 MED ORDER — INSULIN ASPART 100 UNIT/ML ~~LOC~~ SOLN
0.0000 [IU] | Freq: Four times a day (QID) | SUBCUTANEOUS | Status: DC
  Administered 2015-03-02 – 2015-03-04 (×3): 1 [IU] via SUBCUTANEOUS
  Administered 2015-03-04: 2 [IU] via SUBCUTANEOUS
  Administered 2015-03-04 – 2015-03-05 (×2): 1 [IU] via SUBCUTANEOUS
  Filled 2015-03-01: qty 2
  Filled 2015-03-01 (×4): qty 1

## 2015-03-01 MED ORDER — SODIUM CHLORIDE 0.9 % IV SOLN
Freq: Once | INTRAVENOUS | Status: AC
  Administered 2015-03-02: 01:00:00 via INTRAVENOUS

## 2015-03-01 MED ORDER — CYCLOBENZAPRINE HCL 10 MG PO TABS
10.0000 mg | ORAL_TABLET | Freq: Three times a day (TID) | ORAL | Status: DC | PRN
  Administered 2015-03-01: 10 mg via ORAL
  Filled 2015-03-01: qty 1

## 2015-03-01 MED ORDER — ACETAMINOPHEN 650 MG RE SUPP: 650.0000 mg | Freq: Four times a day (QID) | RECTAL | Status: DC | PRN

## 2015-03-01 MED ORDER — GABAPENTIN 600 MG PO TABS
600.0000 mg | ORAL_TABLET | Freq: Three times a day (TID) | ORAL | Status: DC
  Administered 2015-03-01: 600 mg via ORAL
  Filled 2015-03-01: qty 1

## 2015-03-01 MED ORDER — ACETAMINOPHEN 325 MG PO TABS
650.0000 mg | ORAL_TABLET | Freq: Once | ORAL | Status: AC
  Administered 2015-03-01: 650 mg via ORAL
  Filled 2015-03-01: qty 2

## 2015-03-01 MED ORDER — PANTOPRAZOLE SODIUM 40 MG IV SOLR
40.0000 mg | Freq: Two times a day (BID) | INTRAVENOUS | Status: DC
  Administered 2015-03-05: 40 mg via INTRAVENOUS
  Filled 2015-03-01: qty 40

## 2015-03-01 MED ORDER — CYCLOBENZAPRINE HCL 10 MG PO TABS
10.0000 mg | ORAL_TABLET | Freq: Once | ORAL | Status: AC
  Administered 2015-03-01: 10 mg via ORAL
  Filled 2015-03-01: qty 1

## 2015-03-01 MED ORDER — SODIUM CHLORIDE 0.9 % IV SOLN
INTRAVENOUS | Status: AC
  Administered 2015-03-02: 05:00:00 via INTRAVENOUS

## 2015-03-01 MED ORDER — MONTELUKAST SODIUM 10 MG PO TABS
10.0000 mg | ORAL_TABLET | Freq: Every day | ORAL | Status: DC
  Administered 2015-03-01 – 2015-03-04 (×4): 10 mg via ORAL
  Filled 2015-03-01 (×4): qty 1

## 2015-03-01 MED ORDER — OXYCODONE-ACETAMINOPHEN 5-325 MG PO TABS
1.0000 | ORAL_TABLET | Freq: Once | ORAL | Status: AC
  Administered 2015-03-01: 1 via ORAL
  Filled 2015-03-01: qty 1

## 2015-03-01 MED ORDER — OXYCODONE HCL 5 MG PO TABS
5.0000 mg | ORAL_TABLET | Freq: Three times a day (TID) | ORAL | Status: DC | PRN
  Administered 2015-03-01 – 2015-03-02 (×2): 5 mg via ORAL
  Filled 2015-03-01 (×2): qty 1

## 2015-03-01 MED ORDER — TRAZODONE HCL 50 MG PO TABS
50.0000 mg | ORAL_TABLET | Freq: Every day | ORAL | Status: DC
  Administered 2015-03-02 – 2015-03-04 (×4): 50 mg via ORAL
  Filled 2015-03-01 (×5): qty 1

## 2015-03-01 MED ORDER — PREGABALIN 75 MG PO CAPS
150.0000 mg | ORAL_CAPSULE | Freq: Two times a day (BID) | ORAL | Status: DC
  Administered 2015-03-01: 150 mg via ORAL
  Filled 2015-03-01: qty 2

## 2015-03-01 MED ORDER — LEVOTHYROXINE SODIUM 50 MCG PO TABS
100.0000 ug | ORAL_TABLET | Freq: Every day | ORAL | Status: DC
Start: 2015-03-02 — End: 2015-03-05
  Administered 2015-03-02 – 2015-03-05 (×4): 100 ug via ORAL
  Filled 2015-03-01 (×4): qty 2

## 2015-03-01 MED ORDER — DIPHENHYDRAMINE HCL 50 MG/ML IJ SOLN
25.0000 mg | Freq: Once | INTRAMUSCULAR | Status: AC
  Administered 2015-03-01: 25 mg via INTRAVENOUS
  Filled 2015-03-01: qty 1

## 2015-03-01 MED ORDER — ALBUTEROL SULFATE (2.5 MG/3ML) 0.083% IN NEBU: 3.0000 mL | INHALATION_SOLUTION | Freq: Four times a day (QID) | RESPIRATORY_TRACT | Status: DC | PRN

## 2015-03-01 MED ORDER — ONDANSETRON HCL 4 MG PO TABS: 4.0000 mg | ORAL_TABLET | Freq: Four times a day (QID) | ORAL | Status: DC | PRN

## 2015-03-01 MED ORDER — LORATADINE 10 MG PO TABS
10.0000 mg | ORAL_TABLET | Freq: Every day | ORAL | Status: DC
  Administered 2015-03-02 – 2015-03-05 (×3): 10 mg via ORAL
  Filled 2015-03-01 (×6): qty 1

## 2015-03-01 MED ORDER — CYCLOBENZAPRINE HCL 10 MG PO TABS: 10.0000 mg | ORAL_TABLET | Freq: Three times a day (TID) | ORAL | Status: DC | PRN

## 2015-03-01 MED ORDER — SODIUM CHLORIDE 0.9 % IV SOLN
Freq: Once | INTRAVENOUS | Status: AC
  Administered 2015-03-01: 19:00:00 via INTRAVENOUS

## 2015-03-01 MED ORDER — CITALOPRAM HYDROBROMIDE 20 MG PO TABS
40.0000 mg | ORAL_TABLET | Freq: Every day | ORAL | Status: DC
  Administered 2015-03-02 – 2015-03-05 (×4): 40 mg via ORAL
  Filled 2015-03-01 (×5): qty 2

## 2015-03-01 MED ORDER — ONDANSETRON HCL 4 MG/2ML IJ SOLN: 4.0000 mg | Freq: Four times a day (QID) | INTRAMUSCULAR | Status: DC | PRN

## 2015-03-01 MED ORDER — ATORVASTATIN CALCIUM 10 MG PO TABS
10.0000 mg | ORAL_TABLET | Freq: Every day | ORAL | Status: DC
  Administered 2015-03-02 – 2015-03-04 (×4): 10 mg via ORAL
  Filled 2015-03-01 (×4): qty 1

## 2015-03-01 MED ORDER — SODIUM CHLORIDE 0.9 % IV SOLN: Freq: Once | INTRAVENOUS | Status: DC

## 2015-03-01 MED ORDER — ACETAMINOPHEN 325 MG PO TABS: 650.0000 mg | ORAL_TABLET | Freq: Four times a day (QID) | ORAL | Status: DC | PRN

## 2015-03-01 NOTE — H&P (Signed)
[facility] NAME: Theresa Leon    MR#:  [id number]  DATE OF BIRTH:  Sep 07, 1955  DATE OF ADMISSION:  03/01/2015  PRIMARY CARE PHYSICIAN: Ellamae Sia, MD   REQUESTING/REFERRING PHYSICIAN: Cinda Quest, MD  CHIEF COMPLAINT:   Chief Complaint  Patient presents with  . Abnormal Lab    HISTORY OF PRESENT ILLNESS:  Theresa Leon  is a 60 y.o. female who presents from outpatient clinic with symptomatic anemia. patient states that for the past week she has been feeling intermittently but progressively more dizzy/lightheaded. She states that this occurs mostly when she stands up. However, for the last day or 2 she has been progressively more fatigued, and significantly more lightheaded. She went to her outpatient physician today for evaluation, and was found to have a hemoglobin of 7. Her last hemoglobin about 3 months ago was greater than 10. She states that she has not paid attention to her stool in order to know if she has had dark stools or not. However, she was Hemoccult positive in the ED. Hospitalists were called for admission   PAST MEDICAL HISTORY:   Past Medical History  Diagnosis Date  . Asthma 1975  . Diabetes mellitus without complication ([place]) 3710  . Heart disease   . 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 COPD (chronic obstructive pulmonary disease) ([place])   . OSA (obstructive sleep apnea)   . Diastolic CHF, acute on chronic (HCC)   . Acute on chronic renal failure ([place])   . Anemia   . Diabetes ([place])   . H/O aortic valve replacement   . Morbid obesity ([place])   . Hypothyroid     PAST SURGICAL HISTORY:   Past Surgical History  Procedure Laterality Date  . Back surgery      AS CHILD  . Phrenic nerve pacemaker implantation  2009  . Aortic valve relaced   2008  . Cholecystectomy  2007  . Breast surgery  September 25, 2011    intraductal papilloma of the left breast  .  Tracheostomy tube placement N/A 02/04/2014    Procedure: TRACHEOSTOMY;  Surgeon: Rozetta Nunnery, MD;  Location: [facility];  Service: ENT;  Laterality: N/A;  . Colonoscopy with propofol N/A 08/04/2014    Procedure: COLONOSCOPY WITH PROPOFOL;  Surgeon: Manya Silvas, MD;  Location: [facility] ENDOSCOPY;  Service: Endoscopy;  Laterality: N/A;  . Esophagogastroduodenoscopy (egd) with propofol  08/04/2014    Procedure: ESOPHAGOGASTRODUODENOSCOPY (EGD) WITH PROPOFOL;  Surgeon: Manya Silvas, MD;  Location: [facility] ENDOSCOPY;  Service: Endoscopy;;    SOCIAL HISTORY:   Social History  Substance Use Topics  . Smoking status: Former Research scientist (life sciences)  . Smokeless tobacco: Not on file  . Alcohol Use: No    FAMILY HISTORY:   Family History  Problem Relation Age of Onset  . Skin telangiectasia Mother   . Lung cancer Maternal Grandmother   . Lung cancer Paternal Grandmother   . Breast cancer Maternal Grandmother     DRUG ALLERGIES:   Allergies  Allergen Reactions  . Furosemide Other (See Comments)    Pt states that this medication caused renal failure.    . Mold Extract [Trichophyton] Anaphylaxis  . Tetanus Toxoids Other (See Comments)    Pt states that her arm turns black.    Rippeon Crigler [Beclomethasone] Other (See Comments)    Reaction:  Thrush   . Toprol Xl [Metoprolol Tartrate]  Hives  . Nitroglycerin Other (See Comments)    Pt states that this medication makes her BP bottom out.    . Other Hives, Swelling and Other (See Comments)    Pt states that she is allergic to steroids.      MEDICATIONS AT HOME:   Prior to Admission medications   Medication Sig Start Date End Date Taking? Authorizing Provider  acetaminophen (TYLENOL) 500 MG tablet Take 1,000 mg by mouth every 6 (six) hours as needed for mild pain or headache.   Yes Historical Provider, MD  albuterol (PROVENTIL HFA;VENTOLIN HFA) 108 (90 BASE) MCG/ACT inhaler Inhale 2 puffs into the lungs every 6 (six) hours as needed for wheezing or shortness  of breath.    Yes Historical Provider, MD  atorvastatin (LIPITOR) 10 MG tablet Take 10 mg by mouth at bedtime.   Yes Historical Provider, MD  Calcium Carbonate-Vitamin D (CALCIUM 600+D) 600-400 MG-UNIT tablet Take 1 tablet by mouth at bedtime.   Yes Historical Provider, MD  cholecalciferol (VITAMIN D) 1000 units tablet Take 1,000 Units by mouth daily.   Yes Historical Provider, MD  citalopram (CELEXA) 40 MG tablet Take 40 mg by mouth daily.   Yes Historical Provider, MD  cyclobenzaprine (FLEXERIL) 10 MG tablet Take 1 tablet (10 mg total) by mouth every 8 (eight) hours as needed for muscle spasms. 01/05/15 01/05/16 Yes Mortimer Fries, PA-C  docusate sodium (COLACE) 100 MG capsule Take 100 mg by mouth 2 (two) times daily as needed for mild constipation.   Yes Historical Provider, MD  EPINEPHrine (EPIPEN 2-PAK) 0.3 mg/0.3 mL IJ SOAJ injection Inject 0.3 mg into the muscle once as needed (for severe allergic reaction).   Yes Historical Provider, MD  fluticasone (FLONASE) 50 MCG/ACT nasal spray Place 2 sprays into both nostrils daily as needed for rhinitis.    Yes Historical Provider, MD  gabapentin (NEURONTIN) 600 MG tablet Take 600 mg by mouth 3 (three) times daily.   Yes Historical Provider, MD  levothyroxine (SYNTHROID, LEVOTHROID) 100 MCG tablet Take 100 mcg by mouth daily before breakfast.    Yes Historical Provider, MD  loratadine (CLARITIN) 10 MG tablet Take 10 mg by mouth daily.   Yes Historical Provider, MD  metFORMIN (GLUCOPHAGE-XR) 500 MG 24 hr tablet Take 500 mg by mouth daily with breakfast.   Yes Historical Provider, MD  montelukast (SINGULAIR) 10 MG tablet Take 10 mg by mouth at bedtime.   Yes Historical Provider, MD  oxyCODONE (OXY IR/ROXICODONE) 5 MG immediate release tablet Take 5 mg by mouth 3 (three) times daily as needed for severe pain.   Yes Historical Provider, MD  pantoprazole (PROTONIX) 40 MG tablet Take 40 mg by mouth daily.   Yes Historical Provider, MD  pregabalin (LYRICA) 150 MG  capsule Take 150 mg by mouth 2 (two) times daily.   Yes Historical Provider, MD  traZODone (DESYREL) 50 MG tablet Take 50 mg by mouth at bedtime.    Yes Historical Provider, MD  oxyCODONE-acetaminophen (ROXICET) 5-325 MG tablet Take 1 tablet by mouth every 6 (six) hours as needed. Patient not taking: Reported on 03/01/2015 12/27/14   Victorino Dike, FNP  tiZANidine (ZANAFLEX) 4 MG tablet Take 1 tablet (4 mg total) by mouth every 6 (six) hours as needed for muscle spasms. Patient not taking: Reported on 03/01/2015 12/27/14   Victorino Dike, FNP    REVIEW OF SYSTEMS:  Review of Systems  Constitutional: Positive for malaise/fatigue. Negative for fever, chills and weight loss.  HENT:  Negative for ear pain, hearing loss and tinnitus.   Eyes: Negative for blurred vision, double vision, pain and redness.  Respiratory: Negative for cough, hemoptysis and shortness of breath.   Cardiovascular: Negative for chest pain, palpitations, orthopnea and leg swelling.  Gastrointestinal: Negative for nausea, vomiting, abdominal pain, diarrhea and constipation.  Genitourinary: Negative for dysuria, frequency and hematuria.  Musculoskeletal: Negative for back pain, joint pain and neck pain.  Skin:       No acne, rash, or lesions  Neurological: Positive for dizziness. Negative for tremors, focal weakness and weakness.  Endo/Heme/Allergies: Negative for polydipsia. Does not bruise/bleed easily.  Psychiatric/Behavioral: Negative for depression. The patient is not nervous/anxious and does not have insomnia.      VITAL SIGNS:   Filed Vitals:   03/01/15 1609 03/01/15 1611 03/01/15 1915 03/01/15 2000  BP:  112/51    Pulse:  84 79 78  Temp:  98.1 F (36.7 C)    TempSrc:  Oral    Resp:  22 15 21   Height: 5' 7"  (1.702 m)     Weight: 147.419 kg (325 lb)     SpO2:  96% 94% 95%   Wt Readings from Last 3 Encounters:  03/01/15 147.419 kg (325 lb)  01/05/15 158.759 kg (350 lb)  12/27/14 145.151 kg (320 lb)     PHYSICAL EXAMINATION:  Physical Exam  Vitals reviewed. Constitutional: She is oriented to person, place, and time. She appears well-developed and well-nourished. No distress.  HENT:  Head: Normocephalic and atraumatic.  Mouth/Throat: Oropharynx is clear and moist.  Eyes: EOM are normal. Pupils are equal, round, and reactive to light. No scleral icterus.  Conjunctival pallor   Neck: Normal range of motion. Neck supple. No JVD present. No thyromegaly present.  Cardiovascular: Normal rate, regular rhythm and intact distal pulses.  Exam reveals no gallop and no friction rub.   No murmur heard. Respiratory: Effort normal and breath sounds normal. No respiratory distress. She has no wheezes. She has no rales.  GI: Soft. Bowel sounds are normal. She exhibits no distension. There is no tenderness.  Musculoskeletal: Normal range of motion. She exhibits no edema.  No arthritis, no gout  Lymphadenopathy:    She has no cervical adenopathy.  Neurological: She is alert and oriented to person, place, and time. No cranial nerve deficit.  No dysarthria, no aphasia  Skin: Skin is warm and dry. No rash noted. No erythema.  Psychiatric: She has a normal mood and affect. Her behavior is normal. Judgment and thought content normal.    LABORATORY PANEL:   CBC  Recent Labs Lab 03/01/15 1613  WBC 7.3  HGB 7.0*  HCT 23.9*  PLT 131*   ------------------------------------------------------------------------------------------------------------------  Chemistries   Recent Labs Lab 03/01/15 1613  NA 139  K 4.1  CL 103  CO2 32  GLUCOSE 141*  BUN 18  CREATININE 1.07*  CALCIUM 9.0   ------------------------------------------------------------------------------------------------------------------  Cardiac Enzymes No results for input(s): TROPONINI in the last 168  hours. ------------------------------------------------------------------------------------------------------------------  RADIOLOGY:  No results found.  EKG:   Orders placed or performed during the hospital encounter of 03/01/15  . ED EKG  . ED EKG    IMPRESSION AND PLAN:  Principal Problem:   Symptomatic anemia - suspect GI bleed. we'll transfuse 1 unit of PRBCs now. Place patient on Protonix drip, keep nothing by mouth, monitor hemoglobin, GI consult. Active Problems:   Type 2 diabetes mellitus ([place]) - Hold home anti-glycemic, sliding scale insulin with corresponding glucose checks every  6 hours while nothing by mouth.   COPD (chronic obstructive pulmonary disease) (HCC) - Continue home nebulizers and meds   Chronic diastolic CHF (congestive heart failure) (HCC) - Continue home meds   OSA on CPAP - Daily at bedtime CPAP   GERD (gastroesophageal reflux disease) - PPI drip for now as above   HLD (hyperlipidemia) - Continue home anti-lipid   Hypothyroidism - Continue home dose thyroid replacement   All the records are reviewed and case discussed with ED provider. Management plans discussed with the patient and/or family.  DVT PROPHYLAXIS: Mechanical only  GI PROPHYLAXIS: PPI  ADMISSION STATUS: Inpatient  CODE STATUS: Full Code Status History    Date Active Date Inactive Code Status Order ID Comments User Context   01/25/2014  7:09 PM 03/15/2014  4:32 PM Full Code [id number]  Carney Harder Inpatient    Advance Directive Documentation        Most Recent Value   Type of Advance Directive  Living will   Pre-existing out of facility DNR order (yellow form or pink MOST form)     "MOST" Form in Place?        TOTAL TIME TAKING CARE OF THIS PATIENT: 45 minutes.    Tema Alire [place] 03/01/2015, 8:58 PM  [facility]  Office  [phone]  CC: Primary care physician; Ellamae Sia, MD

## 2015-03-01 NOTE — ED Notes (Signed)
Pt to triage via wheelchair.  Pt sent to er for eval of low hemoglobin from [facility].  Pt reports dizziness.

## 2015-03-01 NOTE — ED Provider Notes (Signed)
[facility] Emergency Department Provider Note  ____________________________________________  Time seen: Approximately 6:11 PM  I have reviewed the triage vital signs and the nursing notes.   HISTORY  Chief Complaint Abnormal Lab    HPI Theresa Leon is a 60 y.o. female patient sent from [facility] feeling weak and dizzy. Patient reports she was taken off of diclofenac. She does not know if she's been having black stools. She has required a transfusion one time before Martyn Malay. She apparently had a COPD exacerbation and hypoxia requiring a trach and then rehabilitation during the vision visit when she had the transfusion. She afterward had upper and lower endoscopy by Dr. Vira Agar did not see any ulcers or any other marked pathology there was some diverticulosis 1 small polyp and in the EGD there was some reddish mucosa.  Past Medical History  Diagnosis Date  . Asthma 1975  . Diabetes mellitus without complication ([place]) 7253  . Heart disease   . Neoplasm of unspecified Theresa of breast 2013    papilloma, left breast   . COPD (chronic obstructive pulmonary disease) ([place])   . OSA (obstructive sleep apnea)   . Diastolic CHF, acute on chronic (HCC)   . Acute on chronic renal failure ([place])   . Anemia   . Diabetes ([place])   . H/O aortic valve replacement   . Morbid obesity ([place])   . Hypothyroid     Patient Active Problem List   Diagnosis Date Noted  . Type 2 diabetes mellitus ([place]) 03/01/2015  . GERD (gastroesophageal reflux disease) 03/01/2015  . HLD (hyperlipidemia) 03/01/2015  . COPD (chronic obstructive pulmonary disease) ([place]) 03/01/2015  . Chronic diastolic CHF (congestive heart failure) ([place]) 03/01/2015  . Hypothyroidism 03/01/2015  . OSA on CPAP 03/01/2015  . Symptomatic anemia 03/01/2015  . Dysphagia   . CHF (congestive heart failure) ([place])   . Acute respiratory failure with hypoxia ([place]) 01/26/2014  . Encounter for imaging study to confirm  orogastric (OG) tube placement   . Respiratory failure ([place])   . 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 Back surgery      AS CHILD  . Phrenic nerve pacemaker implantation  2009  . Aortic valve relaced   2008  . Cholecystectomy  2007  . Breast surgery  September 25, 2011    intraductal papilloma of the left breast  . Tracheostomy tube placement N/A 02/04/2014    Procedure: TRACHEOSTOMY;  Surgeon: Rozetta Nunnery, MD;  Location: [place];  Service: ENT;  Laterality: N/A;  . Colonoscopy with propofol N/A 08/04/2014    Procedure: COLONOSCOPY WITH PROPOFOL;  Surgeon: Manya Silvas, MD;  Location: [facility] ENDOSCOPY;  Service: Endoscopy;  Laterality: N/A;  . Esophagogastroduodenoscopy (egd) with propofol  08/04/2014    Procedure: ESOPHAGOGASTRODUODENOSCOPY (EGD) WITH PROPOFOL;  Surgeon: Manya Silvas, MD;  Location[facility] ENDOSCOPY;  Service: Endoscopy;;  . Esophagogastroduodenoscopy Left 03/03/2015    Procedure: ESOPHAGOGASTRODUODENOSCOPY (EGD);  Surgeon: Manya Silvas, MD;  Location: [facility] ENDOSCOPY;  Service: Endoscopy;  Laterality: Left;    No current outpatient prescriptions on file.  Allergies Furosemide; Mold extract; Tetanus toxoids; Qvar; Toprol xl; Nitroglycerin; and Other  Family History  Problem Relation Age of Onset  . Skin telangiectasia Mother   . Lung cancer Maternal Grandmother   . Lung cancer Paternal Grandmother   . Breast cancer Maternal Grandmother     Social History Social History  Substance Use  Topics  . Smoking status: Former Research scientist (life sciences)  . Smokeless tobacco: None  . Alcohol Use: No    Review of Systems Constitutional: No fever/chills Eyes: No visual changes. ENT: No sore throat. Cardiovascular: Denies chest pain. Respiratory: Denies shortness of breath. Gastrointestinal: No abdominal pain.  No nausea, no vomiting.  No diarrhea.  No constipation. Genitourinary: Negative for  dysuria. Musculoskeletal: Negative for back pain. Skin: Negative for rash. Neurological: Negative for headaches, focal weakness or numbness.  10-point ROS otherwise negative.  ____________________________________________   PHYSICAL EXAM:  VITAL SIGNS: ED Triage Vitals  Enc Vitals Group     BP 03/01/15 1611 112/51 mmHg     Pulse Rate 03/01/15 1611 84     Resp 03/01/15 1611 22     Temp 03/01/15 1611 98.1 F (36.7 C)     Temp Source 03/01/15 1611 Oral     SpO2 03/01/15 1611 96 %     Weight 03/01/15 1609 325 lb (147.419 kg)     Height 03/01/15 1609 5' 7"  (1.702 m)     Head Cir --      Peak Flow --      Pain Score 03/01/15 1610 7     Pain Loc --      Pain Edu? --      Excl. in [place]? --     Constitutional: Alert and oriented. Well appearing and in no acute distress. Eyes: Conjunctivae are normal. PERRL. EOMI. Head: Atraumatic. Nose: No congestion/rhinnorhea. Mouth/Throat: Mucous membranes are moist.  Oropharynx non-erythematous. Neck: No stridor. Cardiovascular: Normal rate, regular rhythm. Grossly normal heart sounds.  Good peripheral circulation. Respiratory: Normal respiratory effort.  No retractions. Lungs CTAB. Gastrointestinal: Soft and nontender. No distention. No abdominal bruits. No CVA tenderness. Musculoskeletal: No lower extremity tenderness nor edema.  No joint effusions. Neurologic:  Normal speech and language. No gross focal neurologic deficits are appreciated. No gait instability. Skin:  Skin is warm, dry and intact. No rash noted.  Rectal: No masses or tenderness but is Hemoccult positive patient remains symptomatic history is dizzy when she stands up and lightheaded and almost fell  ____________________________________________   LABS (all labs ordered are listed, but only abnormal results are displayed)  Labs Reviewed  BASIC METABOLIC PANEL - Abnormal; Notable for the following:    Glucose, Bld 141 (*)    Creatinine, Ser 1.07 (*)    GFR calc non Af  Amer 56 (*)    Anion gap 4 (*)    All other components within normal limits  CBC - Abnormal; Notable for the following:    RBC 3.42 (*)    Hemoglobin 7.0 (*)    HCT 23.9 (*)    MCV 69.9 (*)    MCH 20.6 (*)    MCHC 29.4 (*)    RDW 17.9 (*)    Platelets 131 (*)    All other components within normal limits  URINALYSIS COMPLETEWITH MICROSCOPIC ([facility] ONLY) - Abnormal; Notable for the following:    Color, Urine YELLOW (*)    APPearance CLEAR (*)    Leukocytes, UA 2+ (*)    Bacteria, UA RARE (*)    Squamous Epithelial / LPF 0-5 (*)    All other components within normal limits  BASIC METABOLIC PANEL - Abnormal; Notable for the following:    Glucose, Bld 114 (*)    Calcium 8.6 (*)    Anion gap 3 (*)    All other components within normal limits  CBC - Abnormal; Notable for the following:  RBC 3.25 (*)    Hemoglobin 6.9 (*)    HCT 22.9 (*)    MCV 70.3 (*)    MCH 21.2 (*)    MCHC 30.1 (*)    RDW 17.4 (*)    Platelets 105 (*)    All other components within normal limits  HEMOGLOBIN A1C - Abnormal; Notable for the following:    Hgb A1c MFr Bld 6.7 (*)    All other components within normal limits  GLUCOSE, CAPILLARY - Abnormal; Notable for the following:    Glucose-Capillary 103 (*)    All other components within normal limits  GLUCOSE, CAPILLARY - Abnormal; Notable for the following:    Glucose-Capillary 130 (*)    All other components within normal limits  CBC - Abnormal; Notable for the following:    RBC 3.44 (*)    Hemoglobin 7.4 (*)    HCT 24.4 (*)    MCV 71.1 (*)    MCH 21.4 (*)    MCHC 30.1 (*)    RDW 17.9 (*)    Platelets 111 (*)    All other components within normal limits  CBC - Abnormal; Notable for the following:    RBC 3.65 (*)    Hemoglobin 8.0 (*)    HCT 26.3 (*)    MCV 71.9 (*)    MCH 22.0 (*)    MCHC 30.6 (*)    RDW 18.4 (*)    Platelets 112 (*)    All other components within normal limits  CBC - Abnormal; Notable for the following:    RBC 3.77  (*)    Hemoglobin 8.1 (*)    HCT 26.8 (*)    MCV 71.3 (*)    MCH 21.5 (*)    MCHC 30.2 (*)    RDW 18.1 (*)    Platelets 112 (*)    All other components within normal limits  GLUCOSE, CAPILLARY - Abnormal; Notable for the following:    Glucose-Capillary 121 (*)    All other components within normal limits  GLUCOSE, CAPILLARY - Abnormal; Notable for the following:    Glucose-Capillary 111 (*)    All other components within normal limits  GLUCOSE, CAPILLARY - Abnormal; Notable for the following:    Glucose-Capillary 120 (*)    All other components within normal limits  TROPONIN I  TROPONIN I  TROPONIN I  GLUCOSE, CAPILLARY  TYPE AND SCREEN  ABO/RH  PREPARE RBC (CROSSMATCH)  PREPARE RBC (CROSSMATCH)  SURGICAL PATHOLOGY   ____________________________________________  EKG EKG read and interpreted by me shows normal sinus rhythm a rate of 79 left axis left anterior hemiblock per the computer the computer is also reading ST elevation in the inferior leads but this is not correct EKG is otherwise normal ____________________________________________  RADIOLOGY   ____________________________________________   PROCEDURES    ____________________________________________   INITIAL IMPRESSION / ASSESSMENT AND PLAN / ED COURSE  Pertinent labs & imaging results that were available during my care of the patient were reviewed by me and considered in my medical decision making (see chart for details).  Patient's pulse goes up from laying to sitting but soon as her blood pressure. ____________________________________________   FINAL CLINICAL IMPRESSION(S) / ED DIAGNOSES  Final diagnoses:  Symptomatic anemia  Lightheadedness      Nena Polio, MD 03/03/15 [phone]

## 2015-03-02 LAB — PREPARE RBC (CROSSMATCH)

## 2015-03-02 LAB — BASIC METABOLIC PANEL
ANION GAP: 3 — AB (ref 5–15)
BUN: 16 mg/dL (ref 6–20)
CHLORIDE: 108 mmol/L (ref 101–111)
CO2: 31 mmol/L (ref 22–32)
Calcium: 8.6 mg/dL — ABNORMAL LOW (ref 8.9–10.3)
Creatinine, Ser: 0.93 mg/dL (ref 0.44–1.00)
GFR calc Af Amer: >60 mL/min (ref >60)
GFR calc non Af Amer: >60 mL/min (ref >60)
Glucose, Bld: 114 mg/dL — ABNORMAL HIGH (ref 65–99)
POTASSIUM: 4.3 mmol/L (ref 3.5–5.1)
SODIUM: 142 mmol/L (ref 135–145)

## 2015-03-02 LAB — CBC
HCT: 24.4 % — ABNORMAL LOW (ref 35.0–47.0)
HEMATOCRIT: 22.9 % — AB (ref 35.0–47.0)
HEMATOCRIT: 26.3 % — AB (ref 35.0–47.0)
HEMOGLOBIN: 6.9 g/dL — AB (ref 12.0–16.0)
Hemoglobin: 7.4 g/dL — ABNORMAL LOW (ref 12.0–16.0)
Hemoglobin: 8 g/dL — ABNORMAL LOW (ref 12.0–16.0)
MCH: 21.2 pg — ABNORMAL LOW (ref 26.0–34.0)
MCH: 21.4 pg — AB (ref 26.0–34.0)
MCH: 22 pg — ABNORMAL LOW (ref 26.0–34.0)
MCHC: 30.1 g/dL — ABNORMAL LOW (ref 32.0–36.0)
MCHC: 30.1 g/dL — AB (ref 32.0–36.0)
MCHC: 30.6 g/dL — AB (ref 32.0–36.0)
MCV: 70.3 fL — AB (ref 80.0–100.0)
MCV: 71.1 fL — AB (ref 80.0–100.0)
MCV: 71.9 fL — ABNORMAL LOW (ref 80.0–100.0)
PLATELETS: 111 10*3/uL — AB (ref 150–440)
PLATELETS: 112 10*3/uL — AB (ref 150–440)
Platelets: 105 10*3/uL — ABNORMAL LOW (ref 150–440)
RBC: 3.25 MIL/uL — AB (ref 3.80–5.20)
RBC: 3.44 MIL/uL — ABNORMAL LOW (ref 3.80–5.20)
RBC: 3.65 MIL/uL — ABNORMAL LOW (ref 3.80–5.20)
RDW: 17.4 % — AB (ref 11.5–14.5)
RDW: 17.9 % — AB (ref 11.5–14.5)
RDW: 18.4 % — AB (ref 11.5–14.5)
WBC: 5.3 10*3/uL (ref 3.6–11.0)
WBC: 4.5 10*3/uL (ref 3.6–11.0)
WBC: 5.6 10*3/uL (ref 3.6–11.0)

## 2015-03-02 LAB — HEMOGLOBIN A1C: Hgb A1c MFr Bld: 6.7 % — ABNORMAL HIGH (ref 4.0–6.0)

## 2015-03-02 LAB — GLUCOSE, CAPILLARY
GLUCOSE-CAPILLARY: 103 mg/dL — AB (ref 65–99)
GLUCOSE-CAPILLARY: 130 mg/dL — AB (ref 65–99)
Glucose-Capillary: 121 mg/dL — ABNORMAL HIGH (ref 65–99)

## 2015-03-02 LAB — TROPONIN I: Troponin I: <0.03 ng/mL (ref <0.031)

## 2015-03-02 MED ORDER — OXYCODONE HCL 5 MG PO TABS
5.0000 mg | ORAL_TABLET | ORAL | Status: DC | PRN
  Administered 2015-03-02 – 2015-03-05 (×10): 5 mg via ORAL
  Filled 2015-03-02 (×10): qty 1

## 2015-03-02 MED ORDER — MORPHINE SULFATE (PF) 2 MG/ML IV SOLN
2.0000 mg | Freq: Once | INTRAVENOUS | Status: AC
  Administered 2015-03-02: 2 mg via INTRAVENOUS
  Filled 2015-03-02: qty 1

## 2015-03-02 MED ORDER — SODIUM CHLORIDE 0.9 % IV SOLN
Freq: Once | INTRAVENOUS | Status: AC
  Administered 2015-03-02: 14:00:00 via INTRAVENOUS

## 2015-03-02 MED ORDER — GABAPENTIN 600 MG PO TABS
600.0000 mg | ORAL_TABLET | Freq: Three times a day (TID) | ORAL | Status: DC
  Filled 2015-03-02 (×3): qty 1

## 2015-03-02 MED ORDER — GABAPENTIN 600 MG PO TABS: 600.0000 mg | ORAL_TABLET | Freq: Three times a day (TID) | ORAL | Status: DC

## 2015-03-02 MED ORDER — OXYCODONE HCL 5 MG PO TABS: 5.0000 mg | ORAL_TABLET | ORAL | Status: DC | PRN

## 2015-03-02 MED ORDER — SODIUM CHLORIDE 0.9 % IV SOLN
INTRAVENOUS | Status: DC
  Administered 2015-03-02: 19:00:00 via INTRAVENOUS

## 2015-03-02 MED ORDER — GABAPENTIN 300 MG PO CAPS
600.0000 mg | ORAL_CAPSULE | Freq: Three times a day (TID) | ORAL | Status: DC
  Administered 2015-03-02 – 2015-03-05 (×9): 600 mg via ORAL
  Filled 2015-03-02 (×10): qty 2

## 2015-03-02 NOTE — Consult Note (Signed)
Patient seen by Claudie Leach see her note for full details.  Patient presented with anemia hgb 7 and light headed.  Her hgb 3 months ago was 10.  She was heme positive in the ER.  Hx of aortic valve replacement and severe obesity, COPD.  Due to symptomatic anemia will get transfused.  Plan to do EGD tomorrow.  She may need colonoscopy and capsule endoscopy as well.

## 2015-03-02 NOTE — Progress Notes (Signed)
[facility] NAME: Theresa Leon    MR#:  [id number]  DATE OF BIRTH:  [birth year]  SUBJECTIVE:  CHIEF COMPLAINT:   Chief Complaint  Patient presents with  . Abnormal Lab   received 1 unit packed overnight Complains of bilateral hand cramping which is chronic denies further symptomatology at this time Noted to still have low hemoglobin 1 further unit transfusion ordered this morning Also complaining of mild chest pain this morning described as "twinge" intensity 4/10 short-lived and no worsening or relieving factors   REVIEW OF SYSTEMS:  CONSTITUTIONAL: No fever, positive fatigue or weakness.  EYES: No blurred or double vision.  EARS, NOSE, AND THROAT: No tinnitus or ear pain.  RESPIRATORY: No cough, shortness of breath, wheezing or hemoptysis.  CARDIOVASCULAR: No chest pain, orthopnea, edema.  GASTROINTESTINAL: No nausea, vomiting, diarrhea or abdominal pain.  GENITOURINARY: No dysuria, hematuria.  ENDOCRINE: No polyuria, nocturia,  HEMATOLOGY: No anemia, easy bruising or bleeding SKIN: No rash or lesion. MUSCULOSKELETAL: No joint pain or arthritis.   NEUROLOGIC: No tingling, numbness, weakness. Positive paresthesias in bilateral hands PSYCHIATRY: No anxiety or depression.   DRUG ALLERGIES:   Allergies  Allergen Reactions  . Furosemide Other (See Comments)    Pt states that this medication caused renal failure.    . Mold Extract [Trichophyton] Anaphylaxis  . Tetanus Toxoids Other (See Comments)    Pt states that her arm turns black.    Beals Crigler [Beclomethasone] Other (See Comments)    Reaction:  Thrush   . Toprol Xl [Metoprolol Tartrate] Hives  . Nitroglycerin Other (See Comments)    Pt states that this medication makes her BP bottom out.    . Other Hives, Swelling and Other (See Comments)    Pt states that she is allergic to steroids.      VITALS:  Blood pressure 113/61, pulse 64, temperature 97.1 F (36.2 C),  temperature source Axillary, resp. rate 18, height 5' 7"  (1.702 m), weight 151.819 kg (334 lb 11.2 oz), SpO2 96 %.  PHYSICAL EXAMINATION:  VITAL SIGNS: Filed Vitals:   03/02/15 0349 03/02/15 0837  BP: 108/62 113/61  Pulse: 70 64  Temp: 98.5 F (36.9 C) 97.1 F (36.2 C)  Resp: 59 18   GENERAL:59 y.o.female currently in no acute distress.  Obese HEAD: Normocephalic, atraumatic.  EYES: Pupils equal, round, reactive to light. Extraocular muscles intact. No scleral icterus.  MOUTH: Moist mucosal membrane. Dentition intact. No abscess noted.  EAR, NOSE, THROAT: Clear without exudates. No external lesions.  NECK: Supple. No thyromegaly. No nodules. No JVD.  PULMONARY: Clear to ascultation, without wheeze rails or rhonci. No use of accessory muscles, Good respiratory effort. good air entry bilaterally CHEST: Nontender to palpation.  CARDIOVASCULAR: S1 and S2. Regular rate and rhythm. No murmurs, rubs, or gallops. No edema. Pedal pulses 2+ bilaterally.  GASTROINTESTINAL: Soft, nontender, nondistended. No masses. Positive bowel sounds. No hepatosplenomegaly.  MUSCULOSKELETAL: No swelling, clubbing, or edema. Range of motion full in all extremities.  NEUROLOGIC: Cranial nerves II through XII are intact. No gross focal neurological deficits. Sensation intact. Reflexes intact.  SKIN: No ulceration, lesions, rashes, or cyanosis. Skin warm and dry. Turgor intact.  PSYCHIATRIC: Mood, affect within normal limits. The patient is awake, alert and oriented x 3. Insight, judgment intact.      LABORATORY PANEL:   CBC  Recent Labs Lab 03/02/15 0528  WBC 5.3  HGB 6.9*  HCT 22.9*  PLT 105*   ------------------------------------------------------------------------------------------------------------------  Chemistries   Recent Labs Lab 03/02/15 0528  NA 142  K 4.3  CL 108  CO2 31  GLUCOSE 114*  BUN 16  CREATININE 0.93  CALCIUM 8.6*    ------------------------------------------------------------------------------------------------------------------  Cardiac Enzymes No results for input(s): TROPONINI in the last 168 hours. ------------------------------------------------------------------------------------------------------------------  RADIOLOGY:  No results found.  EKG:   Orders placed or performed during the hospital encounter of 03/01/15  . ED EKG  . ED EKG    ASSESSMENT AND PLAN:   60 year old Caucasian female history of type 2 diabetes non-insulin-requiring, COPD non-option requiring who is presenting with weakness. She was admitted to the hospital 03/01/2015 for symptomatic anemia  1. Symptomatic anemia: Status post 2 unit packed red blood cell transfusion, given history of Voltaren and other NSAID usage suspect gastritis/upper GI bleed as etiology. Placed on Protonix, follow hemoglobin, transfuse less than 7, follow-up on gastroenterology consult, will start clear liquid diet  2. Chest pain: We'll check cardiac enzymes 3 will consult cardiology she follows with Dr. Humphrey Rolls I suspect this is in relation to demand ischemia  3. Type 2 diabetes non-insulin-requiring: Hold oral agents at insulin sliding scale 4. Hyperlipidemia unspecified: Statin therapy 5. Obstructive sleep apnea: Continue BiPAP at night 6. Venous embolism prophylactic: SCDs given concern for bleed      All the records are reviewed and case discussed with Care Management/Social Workerr. Management plans discussed with the patient, family and they are in agreement.  CODE STATUS: Full  TOTAL TIME TAKING CARE OF THIS PATIENT: 33 minutes.   POSSIBLE D/C IN 1-2 DAYS, DEPENDING ON CLINICAL CONDITION.   Cherine Drumgoole,  [name] on 03/02/2015 at 11:55 AM  Between 7am to 6pm - Pager - [phone]  After 6pm: House Pager: - [phone]  [facility]  Office  [phone]  CC: Primary care physician; Ellamae Sia,  MD

## 2015-03-02 NOTE — Progress Notes (Signed)
Pt. complained of neuropatic pain in her left arm/hand and leg and requested her home med gabapentin. However, med had been DC as Pt primary MD had put her on Lyrica as of 2/1. MD and pharmacy consulted  And Pt informed that the both meds cannot be taken together. MD ordered Morphine 5m once for severe pain and reordered Gabapentin for the AM to replace the Lyrica as the pt noted that this new med is not helping her pain.

## 2015-03-02 NOTE — Consult Note (Signed)
GI Inpatient Consult Note  Reason for Consult: GI Bleed/ anemia   Attending Requesting Consult: Dr. Jannifer Franklin  History of Present Illness: Theresa Leon is a 60 y.o. female with a significant history of diabetes, heart disease, COPD, sleep apnea, iron deficiency anemia, aortic valve replacement, morbid obesity reports that she started having episodes of dizziness 1 week ago.  2 days ago she took a shower and felt "like she had ran a marathon" after just taking a shower.  She felt lightheaded for the rest of the day but was able to continue with her errands.  She had a doctor's appointment yesterday at 13:30, hemoglobin of 7.0 and sent her to the emergency department.  She has a significant history of being hospitalized from December 2015 and March 2016 with several complications of severe anemia, which required intubation, AKF.  She developed C. difficile during this hospitalization.  She was seen in the office on Jun 20, 2014 for iron deficiency anemia.  She had a colonoscopy performed on August 04, 2014 with Dr. Vira Agar for the indication of iron deficiency anemia.  Impression: 1 diminutive polyp in the sigmoid colon.  Diverticulosis in the sigmoid colon.  Internal hemorrhoids.  Examination was otherwise normal.  She had an upper endoscopy completed on the same day.  Findings: Patchy mildly erythematous mucosa without bleeding was found in the gastric antrum.  Normal esophagus.  Normal examined duodenum.  Pathology: Erosive gastritis acute hemorrhagic.  Patient reports she was instructed to take iron, but will only take it 1-2 times a week because it adds to her constipation, which she already has from using chronic narcotics.  She is unsure if her stools have been dark.  She takes Protonix 40 mg once a day, denies any heartburn or acid reflux symptoms.  Denies any abdominal pain.  She has taken Voltaren on a daily basis until recently when her primary care tried to switch her to Lyrica, but  unfortunately is unable to do so due to her gabapentin.  She does report occasional use of Aleve as well, but not recently.  She reports her mother has Paget's disease of the bone and is currently being checked for possible rectal cancer.    Past Medical History:  Past Medical History  Diagnosis Date  . Asthma 1975  . Diabetes mellitus without complication ([place]) 8676  . Heart disease   . 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 COPD (chronic obstructive pulmonary disease) ([place])   . OSA (obstructive sleep apnea)   . Diastolic CHF, acute on chronic ([facility])   . Acute on chronic renal failure ([place])   . Anemia   . Diabetes ([place])   . H/O aortic valve replacement   . Morbid obesity ([place])   . Hypothyroid     Problem List: Patient Active Problem List   Diagnosis Date Noted  . Type 2 diabetes mellitus ([place]) 03/01/2015  . GERD (gastroesophageal reflux disease) 03/01/2015  . HLD (hyperlipidemia) 03/01/2015  . COPD (chronic obstructive pulmonary disease) ([place]) 03/01/2015  . Chronic diastolic CHF (congestive heart failure) ([place]) 03/01/2015  . Hypothyroidism 03/01/2015  . OSA on CPAP 03/01/2015  . Symptomatic anemia 03/01/2015  . Dysphagia   . CHF (congestive heart failure) ([place])   . Acute respiratory failure with hypoxia ([place]) 01/26/2014  . Encounter for imaging study to confirm orogastric (OG) tube placement   . Respiratory failure ([place])   . 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 Back surgery      AS CHILD  . Phrenic nerve pacemaker implantation  2009  . Aortic valve relaced   2008  . Cholecystectomy  2007  . Breast surgery  September 25, 2011    intraductal papilloma of the left breast  . Tracheostomy tube placement N/A 02/04/2014    Procedure: TRACHEOSTOMY;  Surgeon: Rozetta Nunnery, MD;  Location: [facility];  Service: ENT;  Laterality: N/A;  . Colonoscopy  with propofol N/A 08/04/2014    Procedure: COLONOSCOPY WITH PROPOFOL;  Surgeon: Manya Silvas, MD;  Location[facility] ENDOSCOPY;  Service: Endoscopy;  Laterality: N/A;  . Esophagogastroduodenoscopy (egd) with propofol  08/04/2014    Procedure: ESOPHAGOGASTRODUODENOSCOPY (EGD) WITH PROPOFOL;  Surgeon: Manya Silvas, MD;  Location: [facility] ENDOSCOPY;  Service: Endoscopy;;    Allergies: Allergies  Allergen Reactions  . Furosemide Other (See Comments)    Pt states that this medication caused renal failure.    . Mold Extract [Trichophyton] Anaphylaxis  . Tetanus Toxoids Other (See Comments)    Pt states that her arm turns black.    Fails Crigler [Beclomethasone] Other (See Comments)    Reaction:  Thrush   . Toprol Xl [Metoprolol Tartrate] Hives  . Nitroglycerin Other (See Comments)    Pt states that this medication makes her BP bottom out.    . Other Hives, Swelling and Other (See Comments)    Pt states that she is allergic to steroids.      Home Medications: Prescriptions prior to admission  Medication Sig Dispense Refill Last Dose  . acetaminophen (TYLENOL) 500 MG tablet Take 1,000 mg by mouth every 6 (six) hours as needed for mild pain or headache.   02/28/2015 at 2200  . albuterol (PROVENTIL HFA;VENTOLIN HFA) 108 (90 BASE) MCG/ACT inhaler Inhale 2 puffs into the lungs every 6 (six) hours as needed for wheezing or shortness of breath.    Past Month at Unknown time  . atorvastatin (LIPITOR) 10 MG tablet Take 10 mg by mouth at bedtime.   02/28/2015 at Unknown time  . Calcium Carbonate-Vitamin D (CALCIUM 600+D) 600-400 MG-UNIT tablet Take 1 tablet by mouth at bedtime.   02/28/2015 at Unknown time  . cholecalciferol (VITAMIN D) 1000 units tablet Take 1,000 Units by mouth daily.   03/01/2015 at Unknown time  . citalopram (CELEXA) 40 MG tablet Take 40 mg by mouth daily.   03/01/2015 at Unknown time  . cyclobenzaprine (FLEXERIL) 10 MG tablet Take 1 tablet (10 mg total) by mouth every 8 (eight) hours as needed for  muscle spasms. 30 tablet 1 Past Month at Unknown time  . docusate sodium (COLACE) 100 MG capsule Take 100 mg by mouth 2 (two) times daily as needed for mild constipation.   Past Week at Unknown time  . EPINEPHrine (EPIPEN 2-PAK) 0.3 mg/0.3 mL IJ SOAJ injection Inject 0.3 mg into the muscle once as needed (for severe allergic reaction).   PRN at PRN  . fluticasone (FLONASE) 50 MCG/ACT nasal spray Place 2 sprays into both nostrils daily as needed for rhinitis.    Past Week at Unknown time  . gabapentin (NEURONTIN) 600 MG tablet Take 600 mg by mouth 3 (three) times daily.   03/01/2015 at Unknown time  . levothyroxine (SYNTHROID, LEVOTHROID) 100 MCG tablet Take 100 mcg by mouth daily before breakfast.    03/01/2015 at Unknown time  . loratadine (CLARITIN) 10 MG tablet Take 10 mg by  mouth daily.   03/01/2015 at Unknown time  . metFORMIN (GLUCOPHAGE-XR) 500 MG 24 hr tablet Take 500 mg by mouth daily with breakfast.   03/01/2015 at Unknown time  . montelukast (SINGULAIR) 10 MG tablet Take 10 mg by mouth at bedtime.   02/28/2015 at Unknown time  . oxyCODONE (OXY IR/ROXICODONE) 5 MG immediate release tablet Take 5 mg by mouth 3 (three) times daily as needed for severe pain.   02/28/2015 at 2200  . pantoprazole (PROTONIX) 40 MG tablet Take 40 mg by mouth daily.   03/01/2015 at Unknown time  . pregabalin (LYRICA) 150 MG capsule Take 150 mg by mouth 2 (two) times daily.   03/01/2015 at Unknown time  . traZODone (DESYREL) 50 MG tablet Take 50 mg by mouth at bedtime.    02/28/2015 at Unknown time  . oxyCODONE-acetaminophen (ROXICET) 5-325 MG tablet Take 1 tablet by mouth every 6 (six) hours as needed. (Patient not taking: Reported on 03/01/2015) 12 tablet 0   . tiZANidine (ZANAFLEX) 4 MG tablet Take 1 tablet (4 mg total) by mouth every 6 (six) hours as needed for muscle spasms. (Patient not taking: Reported on 03/01/2015) 30 tablet 0    Home medication reconciliation was completed with the patient.   Scheduled Inpatient  Medications:   . sodium chloride   Intravenous Once  . atorvastatin  10 mg Oral QHS  . citalopram  40 mg Oral Daily  . gabapentin  600 mg Oral TID  . insulin aspart  0-9 Units Subcutaneous Q6H  . levothyroxine  100 mcg Oral QAC breakfast  . loratadine  10 mg Oral Daily  . montelukast  10 mg Oral QHS  . [START ON 03/05/2015] pantoprazole (PROTONIX) IV  40 mg Intravenous Q12H  . traZODone  50 mg Oral QHS    Continuous Inpatient Infusions:   . pantoprozole (PROTONIX) infusion 8 mg/hr (03/02/15 0137)    PRN Inpatient Medications:  acetaminophen **OR** acetaminophen, albuterol, cyclobenzaprine, ondansetron **OR** ondansetron (ZOFRAN) IV, oxyCODONE  Family History: family history includes Breast cancer in her maternal grandmother; Lung cancer in her maternal grandmother and paternal grandmother; Skin telangiectasia in her mother.   Social History:   reports that she has quit smoking. She does not have any smokeless tobacco history on file. She reports that she does not drink alcohol or use illicit drugs.   Review of Systems: Constitutional: Weight is stable.  Eyes: No changes in vision. ENT: No oral lesions, sore throat.  GI: see HPI.  Heme/Lymph: No easy bruising.  CV: No chest pain.  GU: No hematuria.  Integumentary: No rashes.  Neuro: No headaches.  Psych: No depression/anxiety.  Endocrine: No heat/cold intolerance.  Allergic/Immunologic: No urticaria.  Resp: No cough  Musculoskeletal: No joint swelling.    Physical Examination: BP 113/61 mmHg  Pulse 64  Temp(Src) 97.1 F (36.2 C) (Axillary)  Resp 18  Ht 5' 7"  (1.702 m)  Wt 151.819 kg (334 lb 11.2 oz)  BMI 52.41 kg/m2  SpO2 96% Gen: NAD, alert and oriented x 4. Obese, pleasant HEENT: conjunctival pallor noted Neck: supple, no JVD or thyromegaly Chest: CTA bilaterally, no wheezes, crackles, or other adventitious sounds CV: RRR, no m/g/c/r Abd: soft, obese,NT, ND, +BS in all four quadrants; no HSM, guarding,  ridigity, or rebound tenderness Ext: no edema, well perfused with 2+ pulses, Skin: no rash or lesions noted Lymph: no LAD  Data: Lab Results  Component Value Date   WBC 5.3 03/02/2015   HGB 6.9* 03/02/2015  HCT 22.9* 03/02/2015   MCV 70.3* 03/02/2015   PLT 105* 03/02/2015    Recent Labs Lab 03/01/15 1613 03/02/15 0528  HGB 7.0* 6.9*   Lab Results  Component Value Date   NA 142 03/02/2015   K 4.3 03/02/2015   CL 108 03/02/2015   CO2 31 03/02/2015   BUN 16 03/02/2015   CREATININE 0.93 03/02/2015   Lab Results  Component Value Date   ALT 12 02/18/2014   AST 17 02/18/2014   ALKPHOS 65 02/18/2014   BILITOT 0.7 02/18/2014   No results for input(s): APTT, INR, PTT in the last 168 hours.    Assessment/Plan: Ms. Southard is a 60 y.o. female with history of anemia, erosive gastritis found on EGD in 07/2014, chronic NSAID use, heme positive stool in ED  Her hemoglobin 1 year ago was 9.6, MCV 90.5.  5 months ago 10.7, MCV 88.5.  On admission 7.0,  MCV 69.9.  Was given 1 unit of blood after admission, hgb currently 6.9, 1 additional unit has been ordered.  Platelets on admission 131, currently 105.  12 months ago platelet level 137.  BUN 16 / Creatinine 0.93  She is currently being treated with the Protonix drip, clear liquids.   Recommendations: We will proceed with an upper endoscopy tomorrow for further evaluation, may require colonoscopy and/or capsule study as well.  We recommend she avoids all NSAIDS.  She should follow up in our office as an outpatient as well.  We agree with current treatment plan.  We will continue to follow with you. Thank you for the consult. Please call with questions or concerns.  Salvadore Farber, PA-C  I personally performed these services.

## 2015-03-03 ENCOUNTER — Inpatient Hospital Stay: Payer: Medicare Other | Admitting: Certified Registered Nurse Anesthetist

## 2015-03-03 ENCOUNTER — Encounter: Payer: Self-pay | Admitting: Certified Registered Nurse Anesthetist

## 2015-03-03 ENCOUNTER — Encounter
Admission: EM | Disposition: A | Discharge status: Home / Self Care | Payer: Self-pay | Source: Home / Self Care | Attending: Internal Medicine

## 2015-03-03 HISTORY — PX: ESOPHAGOGASTRODUODENOSCOPY: SHX5428

## 2015-03-03 LAB — GLUCOSE, CAPILLARY
GLUCOSE-CAPILLARY: 111 mg/dL — AB (ref 65–99)
GLUCOSE-CAPILLARY: 120 mg/dL — AB (ref 65–99)
Glucose-Capillary: 91 mg/dL (ref 65–99)

## 2015-03-03 LAB — CBC
HCT: 26.8 % — ABNORMAL LOW (ref 35.0–47.0)
Hemoglobin: 8.1 g/dL — ABNORMAL LOW (ref 12.0–16.0)
MCH: 21.5 pg — AB (ref 26.0–34.0)
MCHC: 30.2 g/dL — ABNORMAL LOW (ref 32.0–36.0)
MCV: 71.3 fL — ABNORMAL LOW (ref 80.0–100.0)
PLATELETS: 112 10*3/uL — AB (ref 150–440)
RBC: 3.77 MIL/uL — AB (ref 3.80–5.20)
RDW: 18.1 % — ABNORMAL HIGH (ref 11.5–14.5)
WBC: 5.7 10*3/uL (ref 3.6–11.0)

## 2015-03-03 LAB — TROPONIN I

## 2015-03-03 SURGERY — EGD (ESOPHAGOGASTRODUODENOSCOPY)
Anesthesia: General | Laterality: Left

## 2015-03-03 MED ORDER — POLYETHYLENE GLYCOL 3350 17 GM/SCOOP PO POWD
1.0000 | Freq: Once | ORAL | Status: AC
  Administered 2015-03-03: 255 g via ORAL
  Filled 2015-03-03: qty 255

## 2015-03-03 MED ORDER — LIDOCAINE HCL (CARDIAC) 20 MG/ML IV SOLN
INTRAVENOUS | Status: DC | PRN
  Administered 2015-03-03: 100 mg via INTRATRACHEAL

## 2015-03-03 MED ORDER — MORPHINE SULFATE (PF) 2 MG/ML IV SOLN
2.0000 mg | Freq: Once | INTRAVENOUS | Status: AC
  Administered 2015-03-03: 2 mg via INTRAVENOUS
  Filled 2015-03-03: qty 1

## 2015-03-03 MED ORDER — DEXMEDETOMIDINE HCL IN NACL 400 MCG/100ML IV SOLN
INTRAVENOUS | Status: DC | PRN
  Administered 2015-03-03: 12 ug via INTRAVENOUS
  Administered 2015-03-03: 4 ug via INTRAVENOUS

## 2015-03-03 MED ORDER — GLYCOPYRROLATE 0.2 MG/ML IJ SOLN
INTRAMUSCULAR | Status: DC | PRN
  Administered 2015-03-03: 0.2 mg via INTRAVENOUS

## 2015-03-03 MED ORDER — PROPOFOL 10 MG/ML IV BOLUS
INTRAVENOUS | Status: DC | PRN
  Administered 2015-03-03: 20 mg via INTRAVENOUS
  Administered 2015-03-03: 10 mg via INTRAVENOUS
  Administered 2015-03-03: 60 mg via INTRAVENOUS
  Administered 2015-03-03: 10 mg via INTRAVENOUS

## 2015-03-03 NOTE — Anesthesia Postprocedure Evaluation (Signed)
Anesthesia Post Note  Patient: Theresa Leon  Procedure(s) Performed: Procedure(s) (LRB): ESOPHAGOGASTRODUODENOSCOPY (EGD) (Left)  Patient location during evaluation: PACU Anesthesia Type: General Level of consciousness: awake Pain management: pain level controlled Vital Signs Assessment: post-procedure vital signs reviewed and stable Respiratory status: spontaneous breathing Cardiovascular status: blood pressure returned to baseline and stable Postop Assessment: no headache    Last Vitals:  Filed Vitals:   03/03/15 0954 03/03/15 1036  BP: 113/48   Pulse: 68   Temp: 37.5 C 36.5 C  Resp: 17     Last Pain:  Filed Vitals:   03/03/15 1036  PainSc: 0-No pain                 Myrene Galas

## 2015-03-03 NOTE — Op Note (Signed)
[facility] Gastroenterology Patient Name: Theresa Leon Procedure Date: 03/03/2015 10:07 AM MRN: [id number] Account #: [id number] Date of Birth: 07-30-1955 Admit Type: Inpatient Age: 60 Room: [facility] ENDO ROOM 1 Gender: Female Note Status: Finalized Procedure:         Upper GI endoscopy Providers:         Manya Silvas, MD Referring MD:      Dr Juanda Crumble drew clinic, MD (Referring MD) Medicines:         Propofol per Anesthesia Complications:     No immediate complications. Procedure:         Pre-Anesthesia Assessment:                    - After reviewing the risks and benefits, the patient was                     deemed in satisfactory condition to undergo the procedure.                    After obtaining informed consent, the endoscope was passed                     under direct vision. Throughout the procedure, the                     patient's blood pressure, pulse, and oxygen saturations                     were monitored continuously. The Endoscope was introduced                     through the mouth, and advanced to the second part of                     duodenum. The upper GI endoscopy was accomplished without                     difficulty. The patient tolerated the procedure well. Findings:      The examined esophagus was normal.      A single, small non-bleeding erosion was found on the greater curvature       of the stomach. There were stigmata of recent bleeding. Biopsies were       taken with a cold forceps for histology. Biopsies were taken with a cold       forceps for Helicobacter pylori testing. To prevent bleeding after the       biopsy, one hemostatic clip was successfully placed (MRI compatible).       There was no bleeding at the end of the procedure.      Diffuse mild inflammation characterized by congestion (edema) and       erythema was found in the gastric antrum. Biopsies were taken with a       cold forceps for histology. Biopsies were  taken with a cold forceps for       Helicobacter pylori testing.      Patchy granular mucosa was found in the duodenal bulb. Biopsies were       taken with a cold forceps for histology.      The 2nd part of the duodenum was normal. Impression:        - Normal esophagus.                    -  Recently bleeding erosive gastropathy. Biopsied.                     MRI-compatible clip was placed.                    - Gastritis. Biopsied.                    - Granular mucosa in the duodenal bulb. Biopsied.                    - Normal 2nd part of the duodenum. Recommendation:    - Await pathology results. Manya Silvas, MD 03/03/2015 10:36:53 AM This report has been signed electronically. Number of Addenda: 0 Note Initiated On: 03/03/2015 10:07 AM      [facility]

## 2015-03-03 NOTE — Anesthesia Preprocedure Evaluation (Signed)
Anesthesia Evaluation  Patient identified by MRN, date of birth, ID band Patient awake    Reviewed: Allergy & Precautions, H&P , NPO status , Patient's Chart, lab work & pertinent test results, reviewed documented beta blocker date and time   History of Anesthesia Complications Negative for: history of anesthetic complications  Airway Mallampati: II  TM Distance: >3 FB Neck ROM: full    Dental no notable dental hx. (+) Teeth Intact, Partial Upper   Pulmonary shortness of breath (for about 1 week) and with exertion, asthma , sleep apnea and Continuous Positive Airway Pressure Ventilation , COPD, neg recent URI, former smoker,    Pulmonary exam normal breath sounds clear to auscultation       Cardiovascular Exercise Tolerance: Good hypertension, (-) angina+ CAD and +CHF  (-) Past MI, (-) Cardiac Stents and (-) CABG Normal cardiovascular exam(-) dysrhythmias + pacemaker + Valvular Problems/Murmurs (s/p AVR) AS  Rhythm:regular Rate:Normal     Neuro/Psych negative neurological ROS  negative psych ROS   GI/Hepatic Neg liver ROS, GERD  ,  Endo/Other  diabetesHypothyroidism Morbid obesity  Renal/GU Renal disease  negative genitourinary   Musculoskeletal   Abdominal   Peds  Hematology  (+) anemia ,   Anesthesia Other Findings Past Medical History:   Asthma                                          1975         Diabetes mellitus without complication ([place])    7106         Heart disease                                                Neoplasm of unspecified nature of breast        2013           Comment:papilloma, left breast    COPD (chronic obstructive pulmonary disease) (*              OSA (obstructive sleep apnea)                                Diastolic CHF, acute on chronic (HCC)                        Acute on chronic renal failure (HCC)                         Anemia                                                        Diabetes (HCC)                                               H/O aortic valve replacement  Morbid obesity ([place])                                         Hypothyroid                                                  Reproductive/Obstetrics negative OB ROS                             Anesthesia Physical  Anesthesia Plan  ASA: IV  Anesthesia Plan: General   Post-op Pain Management:    Induction:   Airway Management Planned:   Additional Equipment:   Intra-op Plan:   Post-operative Plan:   Informed Consent: I have reviewed the patients History and Physical, chart, labs and discussed the procedure including the risks, benefits and alternatives for the proposed anesthesia with the patient or authorized representative who has indicated his/her understanding and acceptance.   Dental Advisory Given  Plan Discussed with: Anesthesiologist, CRNA and Surgeon  Anesthesia Plan Comments:         Anesthesia Quick Evaluation

## 2015-03-03 NOTE — Progress Notes (Signed)
VSS post upper endoscopy.  Pt transported back to room 207.  Report called to [place], Therapist, sports.

## 2015-03-03 NOTE — Consult Note (Signed)
Patient to have capsule endoscopy tomorrow.  Will put her on clear liquids for supper, drink Miralax prep tonight.

## 2015-03-03 NOTE — Transfer of Care (Signed)
Immediate Anesthesia Transfer of Care Note  Patient: Theresa Leon  Procedure(s) Performed: Procedure(s): ESOPHAGOGASTRODUODENOSCOPY (EGD) (Left)  Patient Location: PACU  Anesthesia Type:General  Level of Consciousness: awake, alert  and oriented  Airway & Oxygen Therapy: Patient Spontanous Breathing and Patient connected to nasal cannula oxygen  Post-op Assessment: Report given to RN, Post -op Vital signs reviewed and stable and Patient moving all extremities X 4  Post vital signs: Reviewed and stable  Last Vitals:  Filed Vitals:   03/03/15 0954 03/03/15 1036  BP: 113/48   Pulse: 68   Temp: 37.5 C 36.5 C  Resp: 17     Complications: No apparent anesthesia complications

## 2015-03-03 NOTE — Progress Notes (Signed)
Theresa Leon is a 60 y.o. female  [id number]  Primary Cardiologist: Theresa Leon Reason for Consultation: Shortness of breath  HPI: This is a 60 year old white female with a past medical history of diabetes COPD obstructive sleep apnea history of aortic valve replacement and CHF due to diastolic dysfunction presented to the hospital with severe anemia. A shouldn't had 6.9 hemoglobin and was very short of breath but no chest pain. She was given 2 units of blood and she's feeling much better now. I was asked to evaluate the patient as patient needed endoscopy.   Review of Systems: No chest pain no shortness of breath orthopnea or PND   Past Medical History  Diagnosis Date  . Asthma 1975  . Diabetes mellitus without complication ([place]) 0263  . Heart disease   . 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 COPD (chronic obstructive pulmonary disease) ([place])   . OSA (obstructive sleep apnea)   . Diastolic CHF, acute on chronic (HCC)   . Acute on chronic renal failure ([place])   . Anemia   . Diabetes ([place])   . H/O aortic valve replacement   . Morbid obesity ([place])   . Hypothyroid     Medications Prior to Admission  Medication Sig Dispense Refill  . acetaminophen (TYLENOL) 500 MG tablet Take 1,000 mg by mouth every 6 (six) hours as needed for mild pain or headache.    . albuterol (PROVENTIL HFA;VENTOLIN HFA) 108 (90 BASE) MCG/ACT inhaler Inhale 2 puffs into the lungs every 6 (six) hours as needed for wheezing or shortness of breath.     Marland Kitchen atorvastatin (LIPITOR) 10 MG tablet Take 10 mg by mouth at bedtime.    . Calcium Carbonate-Vitamin D (CALCIUM 600+D) 600-400 MG-UNIT tablet Take 1 tablet by mouth at bedtime.    . cholecalciferol (VITAMIN D) 1000 units tablet Take 1,000 Units by mouth daily.    . citalopram (CELEXA) 40 MG tablet Take 40 mg by mouth daily.    . cyclobenzaprine (FLEXERIL) 10 MG tablet Take 1 tablet (10 mg total) by mouth every 8 (eight)  hours as needed for muscle spasms. 30 tablet 1  . docusate sodium (COLACE) 100 MG capsule Take 100 mg by mouth 2 (two) times daily as needed for mild constipation.    Marland Kitchen EPINEPHrine (EPIPEN 2-PAK) 0.3 mg/0.3 mL IJ SOAJ injection Inject 0.3 mg into the muscle once as needed (for severe allergic reaction).    . fluticasone (FLONASE) 50 MCG/ACT nasal spray Place 2 sprays into both nostrils daily as needed for rhinitis.     Marland Kitchen gabapentin (NEURONTIN) 600 MG tablet Take 600 mg by mouth 3 (three) times daily.    Marland Kitchen levothyroxine (SYNTHROID, LEVOTHROID) 100 MCG tablet Take 100 mcg by mouth daily before breakfast.     . loratadine (CLARITIN) 10 MG tablet Take 10 mg by mouth daily.    . metFORMIN (GLUCOPHAGE-XR) 500 MG 24 hr tablet Take 500 mg by mouth daily with breakfast.    . montelukast (SINGULAIR) 10 MG tablet Take 10 mg by mouth at bedtime.    Marland Kitchen oxyCODONE (OXY IR/ROXICODONE) 5 MG immediate release tablet Take 5 mg by mouth 3 (three) times daily as needed for severe pain.    . pantoprazole (PROTONIX) 40 MG tablet Take 40 mg by mouth daily.    . pregabalin (LYRICA) 150 MG capsule Take 150 mg by mouth 2 (two) times daily.    . traZODone (DESYREL)  50 MG tablet Take 50 mg by mouth at bedtime.     Marland Kitchen oxyCODONE-acetaminophen (ROXICET) 5-325 MG tablet Take 1 tablet by mouth every 6 (six) hours as needed. (Patient not taking: Reported on 03/01/2015) 12 tablet 0  . tiZANidine (ZANAFLEX) 4 MG tablet Take 1 tablet (4 mg total) by mouth every 6 (six) hours as needed for muscle spasms. (Patient not taking: Reported on 03/01/2015) 30 tablet 0     . atorvastatin  10 mg Oral QHS  . citalopram  40 mg Oral Daily  . gabapentin  600 mg Oral TID  . insulin aspart  0-9 Units Subcutaneous Q6H  . levothyroxine  100 mcg Oral QAC breakfast  . loratadine  10 mg Oral Daily  . montelukast  10 mg Oral QHS  . [START ON 03/05/2015] pantoprazole (PROTONIX) IV  40 mg Intravenous Q12H  . traZODone  50 mg Oral QHS    Infusions: .  pantoprozole (PROTONIX) infusion 8 mg/hr (03/02/15 0137)    Allergies  Allergen Reactions  . Furosemide Other (See Comments)    Pt states that this medication caused renal failure.    . Mold Extract [Trichophyton] Anaphylaxis  . Tetanus Toxoids Other (See Comments)    Pt states that her arm turns black.    Theresa Leon [Beclomethasone] Other (See Comments)    Reaction:  Thrush   . Toprol Xl [Metoprolol Tartrate] Hives  . Nitroglycerin Other (See Comments)    Pt states that this medication makes her BP bottom out.    . Other Hives, Swelling and Other (See Comments)    Pt states that she is allergic to steroids.      Social History   Social History  . Marital Status: Widowed    Spouse Name: N/A  . Number of Children: N/A  . Years of Education: N/A   Occupational History  . Not on file.   Social History Main Topics  . Smoking status: Former Research scientist (life sciences)  . Smokeless tobacco: Not on file  . Alcohol Use: No  . Drug Use: No  . Sexual Activity: Not on file   Other Topics Concern  . Not on file   Social History Narrative    Family History  Problem Relation Age of Onset  . Skin telangiectasia Mother   . Lung cancer Maternal Grandmother   . Lung cancer Paternal Grandmother   . Breast cancer Maternal Grandmother     PHYSICAL EXAM: Filed Vitals:   03/03/15 1106 03/03/15 1213  BP: 109/59 120/53  Pulse: 78 75  Temp:  98.5 F (36.9 C)  Resp: 18 14     Intake/Output Summary (Last 24 hours) at 03/03/15 1238 Last data filed at 03/03/15 1032  Gross per 24 hour  Intake   1418 ml  Output   4025 ml  Net  -2607 ml    General:  Well appearing. No respiratory difficulty HEENT: normal Neck: supple. no JVD. Carotids 2+ bilat; no bruits. No lymphadenopathy or thryomegaly appreciated. Cor: PMI nondisplaced. Regular rate & rhythm. No rubs, gallops or murmurs. Lungs: clear Abdomen: soft, nontender, nondistended. No hepatosplenomegaly. No bruits or masses. Good bowel  sounds. Extremities: no cyanosis, clubbing, rash, edema Neuro: alert & oriented x 3, cranial nerves grossly intact. moves all 4 extremities w/o difficulty. Affect pleasant.  ECG: No EKG in the chart  Results for orders placed or performed during the hospital encounter of 03/01/15 (from the past 24 hour(s))  Troponin I (q 6hr x 3)     Status:  None   Collection Time: 03/02/15  5:48 PM  Result Value Ref Range   Troponin I <0.03 <0.031 ng/mL  CBC     Status: Abnormal   Collection Time: 03/02/15  5:48 PM  Result Value Ref Range   WBC 5.6 3.6 - 11.0 K/uL   RBC 3.65 (L) 3.80 - 5.20 MIL/uL   Hemoglobin 8.0 (L) 12.0 - 16.0 g/dL   HCT 26.3 (L) 35.0 - 47.0 %   MCV 71.9 (L) 80.0 - 100.0 fL   MCH 22.0 (L) 26.0 - 34.0 pg   MCHC 30.6 (L) 32.0 - 36.0 g/dL   RDW 18.4 (H) 11.5 - 14.5 %   Platelets 112 (L) 150 - 440 K/uL  Glucose, capillary     Status: Abnormal   Collection Time: 03/02/15  6:46 PM  Result Value Ref Range   Glucose-Capillary 121 (H) 65 - 99 mg/dL  Troponin I (q 6hr x 3)     Status: None   Collection Time: 03/02/15 11:54 PM  Result Value Ref Range   Troponin I <0.03 <0.031 ng/mL  CBC     Status: Abnormal   Collection Time: 03/02/15 11:54 PM  Result Value Ref Range   WBC 5.7 3.6 - 11.0 K/uL   RBC 3.77 (L) 3.80 - 5.20 MIL/uL   Hemoglobin 8.1 (L) 12.0 - 16.0 g/dL   HCT 26.8 (L) 35.0 - 47.0 %   MCV 71.3 (L) 80.0 - 100.0 fL   MCH 21.5 (L) 26.0 - 34.0 pg   MCHC 30.2 (L) 32.0 - 36.0 g/dL   RDW 18.1 (H) 11.5 - 14.5 %   Platelets 112 (L) 150 - 440 K/uL  Glucose, capillary     Status: None   Collection Time: 03/02/15 11:59 PM  Result Value Ref Range   Glucose-Capillary 91 65 - 99 mg/dL   Comment 1 Notify RN   Glucose, capillary     Status: Abnormal   Collection Time: 03/03/15  5:33 AM  Result Value Ref Range   Glucose-Capillary 111 (H) 65 - 99 mg/dL   Comment 1 Notify RN    No results found.   ASSESSMENT AND PLAN: History of aortic valve replacement with the diastolic CHF  presented with shortness of breath related to severe anemia secondary to GI bleed. After transfusion 2 units of packed red blood*patient is feeling much better and had endoscopy already without any complications. May follow up the patient in the office after 2 weeks. Cardiac point of view can be discharged with follow-up 2 weeks.  Ashya Nicolaisen A

## 2015-03-03 NOTE — Clinical Documentation Improvement (Signed)
Internal Medicine  Can the diagnosis of symptomatic anemia be further specified in progress notes and discharge summary?   Acute blood loss anemia  Chronic blood loss anemia  Acute on Chronic blood loss anemia  Iron deficiency anemia  Other  Clinically Undetermined  Document any associated diagnoses/conditions.   Supporting Information:  Admitted d/t symptomatic anemia  H&P presents from outpatient clinic with symptomatic anemia. patient states that for the past week she has been feeling intermittently but progressively more dizzy/lightheaded. She states that this occurs mostly when she stands up. However, for the last day or 2 she has been progressively more fatigued, and significantly more lightheaded. She went to her outpatient physician today for evaluation, and was found to have a hemoglobin of 7. Her last hemoglobin about 3 months ago was greater than 10. History of anemia Principal Problem:  Symptomatic anemia - suspect GI bleed. we'll transfuse 1 unit of PRBCs now. Place patient on Protonix drip, keep nothing by mouth, monitor hemoglobin, GI consult.  2/2 progress note: 1. Symptomatic anemia: Status post 2 unit packed red blood cell transfusion, given history of Voltaren and other NSAID usage suspect gastritis/upper GI bleed as etiology. Placed on Protonix, follow hemoglobin, transfuse less than 7, follow-up on gastroenterology consult, will start clear liquid diet  Component     Latest Ref Rng 03/01/2015 03/02/2015 03/02/2015 03/02/2015 03/02/2015          5:28 AM 11:56 AM  5:48 PM 11:54 PM  Hemoglobin     12.0 - 16.0 g/dL 7.0 (L) 6.9 (L) 7.4 (L) 8.0 (L) 8.1 (L)  HCT     35.0 - 47.0 % 23.9 (L) 22.9 (L) 24.4 (L) 26.3 (L) 26.8 (L)   Treatments GI consult Monitor CBC Transfused RBC   Please exercise your independent, professional judgment when responding. A specific answer is not anticipated or expected.   Thank You,  [place] [phone]

## 2015-03-03 NOTE — Care Management Important Message (Signed)
Important Message  Patient Details  Name: Theresa Leon MRN: [id number] Date of Birth: [birth year]   Medicare Important Message Given:  Yes    [name] 03/03/2015, 1:38 PM

## 2015-03-03 NOTE — Progress Notes (Signed)
Theresa Leon at [place] NAME: Theresa Leon    MR#:  [id number]  DATE OF BIRTH:  Jan 30, 1955  SUBJECTIVE:  Well overnight, "im hungry" complaints at this time  REVIEW OF SYSTEMS:  CONSTITUTIONAL: No fever, fatigue or weakness.  EYES: No blurred or double vision.  EARS, NOSE, AND THROAT: No tinnitus or ear pain.  RESPIRATORY: No cough, shortness of breath, wheezing or hemoptysis.  CARDIOVASCULAR: No chest pain, orthopnea, edema.  GASTROINTESTINAL: No nausea, vomiting, diarrhea or abdominal pain.  GENITOURINARY: No dysuria, hematuria.  ENDOCRINE: No polyuria, nocturia,  HEMATOLOGY: No anemia, easy bruising or bleeding SKIN: No rash or lesion. MUSCULOSKELETAL: No joint pain or arthritis.   NEUROLOGIC: No tingling, numbness, weakness.  PSYCHIATRY: No anxiety or depression.   DRUG ALLERGIES:   Allergies  Allergen Reactions  . Furosemide Other (See Comments)    Pt states that this medication caused renal failure.    . Mold Extract [Trichophyton] Anaphylaxis  . Tetanus Toxoids Other (See Comments)    Pt states that her arm turns black.    Levario Crigler [Beclomethasone] Other (See Comments)    Reaction:  Thrush   . Toprol Xl [Metoprolol Tartrate] Hives  . Nitroglycerin Other (See Comments)    Pt states that this medication makes her BP bottom out.    . Other Hives, Swelling and Other (See Comments)    Pt states that she is allergic to steroids.      VITALS:  Blood pressure 120/53, pulse 75, temperature 98.5 F (36.9 C), temperature source Oral, resp. rate 14, height 5' 7"  (1.702 m), weight 151.819 kg (334 lb 11.2 oz), SpO2 93 %.  PHYSICAL EXAMINATION:  VITAL SIGNS: Filed Vitals:   03/03/15 1106 03/03/15 1213  BP: 109/59 120/53  Pulse: 78 75  Temp:  98.5 F (36.9 C)  Resp: 41 14   GENERAL:59 y.o.female currently in no acute distress. Obese HEAD: Normocephalic, atraumatic.  EYES: Pupils equal, round, reactive to light.  Extraocular muscles intact. No scleral icterus.  MOUTH: Moist mucosal membrane. Dentition intact. No abscess noted.  EAR, NOSE, THROAT: Clear without exudates. No external lesions.  NECK: Supple. No thyromegaly. No nodules. No JVD.  PULMONARY: Clear to ascultation, without wheeze rails or rhonci. No use of accessory muscles, Good respiratory effort. good air entry bilaterally CHEST: Nontender to palpation.  CARDIOVASCULAR: S1 and S2. Regular rate and rhythm. No murmurs, rubs, or gallops. No edema. Pedal pulses 2+ bilaterally.  GASTROINTESTINAL: Soft, nontender, nondistended. No masses. Positive bowel sounds. No hepatosplenomegaly.  MUSCULOSKELETAL: No swelling, clubbing, or edema. Range of motion full in all extremities.  NEUROLOGIC: Cranial nerves II through XII are intact. No gross focal neurological deficits. Sensation intact. Reflexes intact.  SKIN: No ulceration, lesions, rashes, or cyanosis. Skin warm and dry. Turgor intact.  PSYCHIATRIC: Mood, affect within normal limits. The patient is awake, alert and oriented x 3. Insight, judgment intact.      LABORATORY PANEL:   CBC  Recent Labs Lab 03/02/15 2354  WBC 5.7  HGB 8.1*  HCT 26.8*  PLT 112*   ------------------------------------------------------------------------------------------------------------------  Chemistries   Recent Labs Lab 03/02/15 0528  NA 142  K 4.3  CL 108  CO2 31  GLUCOSE 114*  BUN 16  CREATININE 0.93  CALCIUM 8.6*   ------------------------------------------------------------------------------------------------------------------  Cardiac Enzymes  Recent Labs Lab 03/02/15 2354  TROPONINI <0.03   ------------------------------------------------------------------------------------------------------------------  RADIOLOGY:  No results found.  EKG:   Orders placed or performed during the hospital  encounter of 03/01/15  . ED EKG  . ED EKG    ASSESSMENT AND PLAN:   60 year old  Caucasian female history of type 2 diabetes non-insulin-requiring, COPD non-option requiring who is presenting with weakness. She was admitted to the hospital 03/01/2015 for symptomatic anemia  1. Symptomatic anemia secondary to chronic blood loss: Status post 2 unit packed red blood cell transfusion, given history of Voltaren and other NSAID usage suspect gastritis/upper GI bleed as etiology. Placed on Protonix, follow hemoglobin, transfuse less than 7,  She is plan for endoscopy today  2. Noncardiac Chest pain: No further episodes of chest pain X 3. Type 2 diabetes non-insulin-requiring: Hold oral agents at insulin sliding scale 4. Hyperlipidemia unspecified: Statin therapy 5. Obstructive sleep apnea: Continue BiPAP at night 6. Morbid obesity: Nutrition counseling 6. Venous embolism prophylactic: SCDs given concern for bleed     All the records are reviewed and case discussed with Care Management/Social Workerr. Management plans discussed with the patient, family and they are in agreement.  CODE STATUS: Full  TOTAL TIME TAKING CARE OF THIS PATIENT: 28 minutes.   POSSIBLE D/C IN 1-2 DAYS, DEPENDING ON CLINICAL CONDITION.   [place],  Theresa Leon.D on 03/03/2015 at 12:20 PM  Between 7am to 6pm - Pager - [phone]  After 6pm: House Pager: - [facility]  Office  [phone]  CC: Primary care physician; Ellamae Sia, MD

## 2015-03-04 LAB — HEMOGLOBIN: Hemoglobin: 9.5 g/dL — ABNORMAL LOW (ref 12.0–16.0)

## 2015-03-04 LAB — GLUCOSE, CAPILLARY
GLUCOSE-CAPILLARY: 156 mg/dL — AB (ref 65–99)
Glucose-Capillary: 106 mg/dL — ABNORMAL HIGH (ref 65–99)
Glucose-Capillary: 123 mg/dL — ABNORMAL HIGH (ref 65–99)
Glucose-Capillary: 144 mg/dL — ABNORMAL HIGH (ref 65–99)

## 2015-03-04 MED ORDER — SODIUM CHLORIDE 0.9 % IV SOLN
INTRAVENOUS | Status: DC
  Administered 2015-03-04 – 2015-03-05 (×2): via INTRAVENOUS

## 2015-03-04 NOTE — Progress Notes (Signed)
SUBJECTIVE: Patient is much better  Filed Vitals:   03/03/15 1213 03/03/15 2119 03/04/15 0445 03/04/15 1300  BP: 120/53 121/55 126/59 130/60  Pulse: 75 73 73 73  Temp: 98.5 F (36.9 C) 98.2 F (36.8 C) 98 F (36.7 C) 98.1 F (36.7 C)  TempSrc: Oral Oral Oral Oral  Resp: 14 20 20 19   Height:      Weight:      SpO2: 93% 95% 97% 98%    Intake/Output Summary (Last 24 hours) at 03/04/15 1925 Last data filed at 03/04/15 1705  Gross per 24 hour  Intake   1028 ml  Output      0 ml  Net   1028 ml    LABS: Basic Metabolic Panel:  Recent Labs  03/02/15 0528  NA 142  K 4.3  CL 108  CO2 31  GLUCOSE 114*  BUN 16  CREATININE 0.93  CALCIUM 8.6*   Liver Function Tests: No results for input(s): AST, ALT, ALKPHOS, BILITOT, PROT, ALBUMIN in the last 72 hours. No results for input(s): LIPASE, AMYLASE in the last 72 hours. CBC:  Recent Labs  03/02/15 1748 03/02/15 2354 03/04/15 1313  WBC 5.6 5.7  --   HGB 8.0* 8.1* 9.5*  HCT 26.3* 26.8*  --   MCV 71.9* 71.3*  --   PLT 112* 112*  --    Cardiac Enzymes:  Recent Labs  03/02/15 1155 03/02/15 1748 03/02/15 2354  TROPONINI <0.03 <0.03 <0.03   BNP: Invalid input(s): POCBNP D-Dimer: No results for input(s): DDIMER in the last 72 hours. Hemoglobin A1C: No results for input(s): HGBA1C in the last 72 hours. Fasting Lipid Panel: No results for input(s): CHOL, HDL, LDLCALC, TRIG, CHOLHDL, LDLDIRECT in the last 72 hours. Thyroid Function Tests: No results for input(s): TSH, T4TOTAL, T3FREE, THYROIDAB in the last 72 hours.  Invalid input(s): FREET3 Anemia Panel: No results for input(s): VITAMINB12, FOLATE, FERRITIN, TIBC, IRON, RETICCTPCT in the last 72 hours.   PHYSICAL EXAM General: Well developed, well nourished, in no acute distress HEENT:  Normocephalic and atramatic Neck:  No JVD.  Lungs: Clear bilaterally to auscultation and percussion. Heart: HRRR . Normal S1 and S2 without gallops or murmurs.  Abdomen:  Bowel sounds are positive, abdomen soft and non-tender  Msk:  Back normal, normal gait. Normal strength and tone for age. Extremities: No clubbing, cyanosis or edema.   Neuro: Alert and oriented X 3. Psych:  Good affect, responds appropriately  TELEMETRY: Not on monitor  ASSESSMENT AND PLAN: CHF with history of severe anemia and GI bleed after blood transfusion is feeling much better. We will follow the patient in the office upon discharge.  Principal Problem:   Symptomatic anemia Active Problems:   Type 2 diabetes mellitus (HCC)   GERD (gastroesophageal reflux disease)   HLD (hyperlipidemia)   COPD (chronic obstructive pulmonary disease) (HCC)   Chronic diastolic CHF (congestive heart failure) (HCC)   Hypothyroidism   OSA on CPAP    Laylia Mui A, MD, [facility] 03/04/2015 7:25 PM

## 2015-03-04 NOTE — Progress Notes (Signed)
Pt refused to wear Cpap .Pt wanted to wear nasal cannula only.

## 2015-03-04 NOTE — Progress Notes (Signed)
Paged Dr Ether Griffins re pt diet order per pt request; Dr ordered full liquids, advance as tolerated to soft diet

## 2015-03-04 NOTE — Progress Notes (Signed)
[facility] NAME: Theresa Leon    MR#:  [id number]  DATE OF BIRTH:  September 02, 1955  SUBJECTIVE:  Niacin, he complains feels comfortable , like to have her diet advanced . Hemoglobin level is pending, no bleeding . Status post EGD revealing erosive gastropathy, gastritis , patient is undergoing capsule endoscopy today  REVIEW OF SYSTEMS:  CONSTITUTIONAL: No fever, fatigue or weakness.  EYES: No blurred or double vision.  EARS, NOSE, AND THROAT: No tinnitus or ear pain.  RESPIRATORY: No cough, shortness of breath, wheezing or hemoptysis.  CARDIOVASCULAR: No chest pain, orthopnea, edema.  GASTROINTESTINAL: No nausea, vomiting, diarrhea or abdominal pain.  GENITOURINARY: No dysuria, hematuria.  ENDOCRINE: No polyuria, nocturia,  HEMATOLOGY: No anemia, easy bruising or bleeding SKIN: No rash or lesion. MUSCULOSKELETAL: No joint pain or arthritis.   NEUROLOGIC: No tingling, numbness, weakness.  PSYCHIATRY: No anxiety or depression.   DRUG ALLERGIES:   Allergies  Allergen Reactions  . Furosemide Other (See Comments)    Pt states that this medication caused renal failure.    . Mold Extract [Trichophyton] Anaphylaxis  . Tetanus Toxoids Other (See Comments)    Pt states that her arm turns black.    Shaff Crigler [Beclomethasone] Other (See Comments)    Reaction:  Thrush   . Toprol Xl [Metoprolol Tartrate] Hives  . Nitroglycerin Other (See Comments)    Pt states that this medication makes her BP bottom out.    . Other Hives, Swelling and Other (See Comments)    Pt states that she is allergic to steroids.      VITALS:  Blood pressure 126/59, pulse 73, temperature 98 F (36.7 C), temperature source Oral, resp. rate 20, height 5' 7"  (1.702 m), weight 151.819 kg (334 lb 11.2 oz), SpO2 97 %.  PHYSICAL EXAMINATION:  VITAL SIGNS: Filed Vitals:   03/03/15 2119 03/04/15 0445  BP: 121/55 126/59  Pulse: 73 73  Temp: 98.2 F (36.8 C) 98 F  (36.7 C)  Resp: 14 20   GENERAL:60 y.o.female currently in no acute distress. Obese HEAD: Normocephalic, atraumatic.  EYES: Pupils equal, round, reactive to light. Extraocular muscles intact. No scleral icterus.  MOUTH: Moist mucosal membrane. Dentition intact. No abscess noted.  EAR, NOSE, THROAT: Clear without exudates. No external lesions.  NECK: Supple. No thyromegaly. No nodules. No JVD.  PULMONARY: Clear to ascultation, without wheeze rails or rhonci. No use of accessory muscles, Good respiratory effort. good air entry bilaterally CHEST: Nontender to palpation.  CARDIOVASCULAR: S1 and S2. Regular rate and rhythm. No murmurs, rubs, or gallops. No edema. Pedal pulses 2+ bilaterally.  GASTROINTESTINAL: Soft, nontender, nondistended. No masses. Positive bowel sounds. No hepatosplenomegaly.  MUSCULOSKELETAL: No swelling, clubbing, or edema. Range of motion full in all extremities.  NEUROLOGIC: Cranial nerves II through XII are intact. No gross focal neurological deficits. Sensation intact. Reflexes intact.  SKIN: No ulceration, lesions, rashes, or cyanosis. Skin warm and dry. Turgor intact.  PSYCHIATRIC: Mood, affect within normal limits. The patient is awake, alert and oriented x 3. Insight, judgment intact.      LABORATORY PANEL:   CBC  Recent Labs Lab 03/02/15 2354  WBC 5.7  HGB 8.1*  HCT 26.8*  PLT 112*   ------------------------------------------------------------------------------------------------------------------  Chemistries   Recent Labs Lab 03/02/15 0528  NA 142  K 4.3  CL 108  CO2 31  GLUCOSE 114*  BUN 16  CREATININE 0.93  CALCIUM 8.6*   ------------------------------------------------------------------------------------------------------------------  Cardiac Enzymes  Recent Labs Lab 03/02/15 2354  TROPONINI <0.03   ------------------------------------------------------------------------------------------------------------------  RADIOLOGY:   No results found.  EKG:   Orders placed or performed during the hospital encounter of 03/01/15  . ED EKG  . ED EKG    ASSESSMENT AND PLAN:   60 year old Caucasian female history of type 2 diabetes non-insulin-requiring, COPD non-option requiring who is presenting with weakness. She was admitted to the hospital 03/01/2015 for symptomatic anemia  1.  acute posthemorrhagic anemia, due to   acute on chronic blood loss, likely due to erosive gastropathy, gastritis, status post EGD by Dr. Vira Agar on February 3: Status post 2 unit packed red blood cell transfusion, given history of Voltaren and other NSAID . Continue  Protonix, follow hemoglobin, transfuse less than 7. We'll need to have follow-up with gastroenterology as outpatient  2. Noncardiac Chest pain: No further episodes of chest pain X 3. Type 2 diabetes non-insulin-requiring: Hold oral agents at insulin sliding scale, blood glucose is ranging between 90s to 160s  4. Hyperlipidemia unspecified: Statin therapy 5. Obstructive sleep apnea: Continue BiPAP at night 6. Morbid obesity: Nutrition counselingIs obtained, patient will be restarted on full liquid diet to be advanced to soft diet as tolerated  6. Venous embolism prophylactic: SCDs given concern for bleed     All the records are reviewed and case discussed with Care Management/Social Workerr. Management plans discussed with the patient, family and they are in agreement.  CODE STATUS: Full  TOTAL TIME TAKING CARE OF THIS PATIENT:40.  Minutes  POSSIBLE D/C IN 1-2 DAYS, DEPENDING ON CLINICAL CONDITION.   Theodoro Grist M.D on 03/04/2015 at 1:02 PM  Between 7am to 6pm - Pager - [phone]  After 6pm: House Pager: - [phone]  [facility]  Office  [phone]  CC: Primary care physician; Ellamae Sia, MD

## 2015-03-04 NOTE — Plan of Care (Signed)
Problem: Safety: Goal: Ability to remain free from injury will improve Outcome: Progressing Pt refuses bed alarm  Problem: Pain Managment: Goal: General experience of comfort will improve Outcome: Progressing 5/10 pain d/t neuropathy in hands and feet per pt  Problem: Activity: Goal: Risk for activity intolerance will decrease Outcome: Progressing BSC at bedside d/t pt dyspnea on exertion throughout shift  Problem: Bowel/Gastric: Goal: Will not experience complications related to bowel motility Outcome: Progressing LBM 03/04/15

## 2015-03-05 DIAGNOSIS — T39395A Adverse effect of other nonsteroidal anti-inflammatory drugs [NSAID], initial encounter: Secondary | ICD-10-CM

## 2015-03-05 DIAGNOSIS — Z9889 Other specified postprocedural states: Secondary | ICD-10-CM

## 2015-03-05 DIAGNOSIS — K2960 Other gastritis without bleeding: Secondary | ICD-10-CM

## 2015-03-05 LAB — GLUCOSE, CAPILLARY
GLUCOSE-CAPILLARY: 110 mg/dL — AB (ref 65–99)
Glucose-Capillary: 133 mg/dL — ABNORMAL HIGH (ref 65–99)

## 2015-03-05 LAB — HEMOGLOBIN: Hemoglobin: 9.1 g/dL — ABNORMAL LOW (ref 12.0–16.0)

## 2015-03-05 MED ORDER — SUCRALFATE 1 G PO TABS: 1.0000 g | ORAL_TABLET | Freq: Three times a day (TID) | ORAL | Status: DC

## 2015-03-05 MED ORDER — PANTOPRAZOLE SODIUM 40 MG PO TBEC: 40.0000 mg | DELAYED_RELEASE_TABLET | Freq: Two times a day (BID) | ORAL | Status: DC

## 2015-03-05 NOTE — Discharge Instructions (Signed)
Follow all MD discharge instructions. Take all medications as prescribed. Keep all follow up appointments. If your symptoms return, call your doctor. If you experience any new symptoms that are of concern to you or that are bothersome to you, call your doctor. For all questions and/or concerns, call your doctor.   If you experience any blood in your stools, call your doctor.    If you have a medical emergency, call 911.

## 2015-03-05 NOTE — Progress Notes (Signed)
Pt d/c home; d/c instructions reviewed w/ pt; pt understanding was verbalized; IV's removed catheter, in tact, gauze dressings applied; all pt questions answered; pt left unit via wheelchair accompanied by staff

## 2015-03-05 NOTE — Consult Note (Signed)
Capsule done yesterday and read today.  No abnormality seen except gastritis and biopsy site in proximal small bowel.  Would recommend discharge on iron pills and multivitamins.  Patient notified.

## 2015-03-05 NOTE — Discharge Summary (Addendum)
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 Asthma 1975  . Diabetes mellitus without complication ([place]) 3748  . Heart disease   . 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 COPD (chronic obstructive pulmonary disease) ([place])   . OSA (obstructive sleep apnea)   . Diastolic CHF, acute on chronic (HCC)   . Acute on chronic renal failure ([place])   . Anemia   . Diabetes ([place])   . H/O aortic valve replacement   . Morbid obesity ([place])   . Hypothyroid     .pro HOSPITAL COURSE:  The patient is 60 year old female with past medical history significant for history of asthma, diabetes, heart disease, COPD, diastolic CHF, renal failure who presents to the hospital with complaints of lightheadedness and dizziness, especially whenever she stands up. She also noted fatigue and lightheadedness. She went to her primary care physician and the labs revealed severe anemia with hemoglobin level of 7, Her last hemoglobin level was checked about 3 months ago and it was above 10. Hemoccult was positive.  Patient was admitted to the hospital, she was  consulted by gastroenterologist and EGD was performed February 3 of by Dr. Vira Agar. It revealed erosive gastropathy, biopsy was performed and MRI compatible clip was placed, it also showed gastritis, granular mucosa in duodenal bulb, but normal esophagus and second part of duodenum. Patient was managed on Protonix intravenously while in the hospital and her diet was slowly advanced. On the day of discharge she was able to tolerate regular diet with no significant discomfort, her hemoglobin remained stable after transfusion. It was felt that patient is stable to be discharged home with follow-up with her primary care physician and gastroenterologist as needed. Of note, since patient was somewhat short of breath. Cardiology consultation was also obtained, although her shortness of breath improved with 2 units of packed red blood cell transfusion.  Discussion by problem 1. acute posthemorrhagic anemia, due to acute on chronic blood loss, due to erosive gastropathy, gastritis, felt to be nonsteroidal anti-inflammatory medication related, status post EGD by Dr. Vira Agar on February 3: Status post 2 unit packed red blood cell transfusion, she was advised not to use any Motrin, Voltaren , or any other NSAID . Continue Protonix twice daily, added Carafate 3 times daily per Dr. Percell Boston recommendations. Hemoglobin remained stable with regular diet, it is recommended to recheck hemoglobin as outpatient, follow-up with gastroenterologist, Dr. Vira Agar as needed. Patient underwent capsule endoscopy which was unremarkable, per telephone interview with Dr. Vira Agar on the day of discharge 2. Noncardiac Chest pain: No further episodes of chest pain , cardiologist saw patient in consultation, no other  recommendations 3. Type 2 diabetes non-insulin-requiring: Resume oral agents at home doses, blood glucose is ranging between 90s to 160s , hemoglobin A1c was checked, was found to be 6.7 4. Hyperlipidemia unspecified: Statin  therapy 5. Obstructive sleep apnea: Continue BiPAP at night 6. Morbid obesity: Nutrition counselingIs obtained, patient was advanced to soft diet  7. Thrombocytopenia, likely due to acute gastrointestinal bleed/compensatory, follow-up as outpatient  DISCHARGE CONDITIONS:   Stable  CONSULTS OBTAINED:  Treatment Team:  Manya Silvas, MD Dionisio David, MD  DRUG ALLERGIES:   Allergies  Allergen Reactions  . Furosemide Other (See Comments)    Pt states that this medication caused renal failure.    . Mold Extract [Trichophyton] Anaphylaxis  . Tetanus Toxoids Other (See Comments)    Pt states that her arm turns black.    K[place]cker Crigler [Beclomethasone] Other (See Comments)    Reaction:  Thrush   . Toprol Xl [Metoprolol Tartrate] Hives  . Nitroglycerin Other (See Comments)    Pt states that this medication makes her BP bottom out.    . Other Hives, Swelling and Other (See Comments)    Pt states that she is allergic to steroids.      DISCHARGE MEDICATIONS:   Discharge Medication List as of 03/05/2015 11:16 AM    START taking these medications   Details  sucralfate (CARAFATE) 1 g tablet Take 1 tablet (1 g total) by mouth 4 (four) times daily -  with meals and at bedtime., Starting 03/05/2015, Until Discontinued, Normal      CONTINUE these medications which have CHANGED   Details  pantoprazole (PROTONIX) 40 MG tablet Take 1 tablet (40 mg total) by mouth 2 (two) times daily. Switch for any other PPI at similar dose and frequency, Starting 03/05/2015, Until Discontinued, Print      CONTINUE these medications which have NOT CHANGED   Details  acetaminophen (TYLENOL) 500 MG tablet Take 1,000 mg by mouth every 6 (six) hours as needed for mild pain or headache., Until Discontinued, Historical Med    albuterol (PROVENTIL HFA;VENTOLIN HFA) 108 (90 BASE) MCG/ACT inhaler Inhale 2 puffs into the lungs every 6 (six) hours as needed for wheezing or shortness of breath. , Until Discontinued,  Historical Med    atorvastatin (LIPITOR) 10 MG tablet Take 10 mg by mouth at bedtime., Until Discontinued, Historical Med    Calcium Carbonate-Vitamin D (CALCIUM 600+D) 600-400 MG-UNIT tablet Take 1 tablet by mouth at bedtime., Until Discontinued, Historical Med    cholecalciferol (VITAMIN D) 1000 units tablet Take 1,000 Units by mouth daily., Until Discontinued, Historical Med    citalopram (CELEXA) 40 MG tablet Take 40 mg by mouth daily., Until Discontinued, Historical Med    cyclobenzaprine (FLEXERIL) 10 MG tablet Take 1 tablet (10 mg total) by mouth every 8 (eight) hours as needed for muscle spasms., Starting 01/05/2015, Until Fri 01/05/16, Print    docusate sodium (COLACE) 100 MG capsule Take 100 mg by mouth 2 (two) times daily as needed for mild constipation., Until Discontinued, Historical Med    EPINEPHrine (EPIPEN 2-PAK) 0.3 mg/0.3 mL IJ SOAJ injection Inject 0.3 mg into the muscle once as needed (for severe allergic reaction)., Until Discontinued, Historical Med    fluticasone (FLONASE) 50 MCG/ACT nasal spray Place 2 sprays into both nostrils daily as needed for rhinitis. , Until Discontinued, Historical Med    gabapentin (NEURONTIN) 600 MG tablet Take 600 mg by mouth 3 (three) times daily., Until Discontinued, Historical Med    levothyroxine (  SYNTHROID, LEVOTHROID) 100 MCG tablet Take 100 mcg by mouth daily before breakfast. , Until Discontinued, Historical Med    loratadine (CLARITIN) 10 MG tablet Take 10 mg by mouth daily., Until Discontinued, Historical Med    metFORMIN (GLUCOPHAGE-XR) 500 MG 24 hr tablet Take 500 mg by mouth daily with breakfast., Until Discontinued, Historical Med    montelukast (SINGULAIR) 10 MG tablet Take 10 mg by mouth at bedtime., Until Discontinued, Historical Med    oxyCODONE (OXY IR/ROXICODONE) 5 MG immediate release tablet Take 5 mg by mouth 3 (three) times daily as needed for severe pain., Until Discontinued, Historical Med    pregabalin (LYRICA)  150 MG capsule Take 150 mg by mouth 2 (two) times daily., Until Discontinued, Historical Med    traZODone (DESYREL) 50 MG tablet Take 50 mg by mouth at bedtime. , Until Discontinued, Historical Med    oxyCODONE-acetaminophen (ROXICET) 5-325 MG tablet Take 1 tablet by mouth every 6 (six) hours as needed., Starting 12/27/2014, Until Discontinued, Print    tiZANidine (ZANAFLEX) 4 MG tablet Take 1 tablet (4 mg total) by mouth every 6 (six) hours as needed for muscle spasms., Starting 12/27/2014, Until Discontinued, Print         DISCHARGE INSTRUCTIONS:    Patient is to follow-up with primary care physician in one week and gastroenterologist as needed  If you experience worsening of your admission symptoms, develop shortness of breath, life threatening emergency, suicidal or homicidal thoughts you must seek medical attention immediately by calling 911 or calling your MD immediately  if symptoms less severe.  You Must read complete instructions/literature along with all the possible adverse reactions/side effects for all the Medicines you take and that have been prescribed to you. Take any new Medicines after you have completely understood and accept all the possible adverse reactions/side effects.   Please note  You were cared for by a hospitalist during your hospital stay. If you have any questions about your discharge medications or the care you received while you were in the hospital after you are discharged, you can call the unit and asked to speak with the hospitalist on call if the hospitalist that took care of you is not available. Once you are discharged, your primary care physician will handle any further medical issues. Please note that NO REFILLS for any discharge medications will be authorized once you are discharged, as it is imperative that you return to your primary care physician (or establish a relationship with a primary care physician if you do not have one) for your aftercare  needs so that they can reassess your need for medications and monitor your lab values.    Today   CHIEF COMPLAINT:   Chief Complaint  Patient presents with  . Abnormal Lab    HISTORY OF PRESENT ILLNESS:  Theresa Leon  is a 60 y.o. female with a known history of asthma, diabetes, heart disease, COPD, diastolic CHF, renal failure who presents to the hospital with complaints of lightheadedness and dizziness, especially whenever she stands up. She also noted fatigue and lightheadedness. She went to her primary care physician and the labs revealed severe anemia with hemoglobin level of 7, Her last hemoglobin level was checked about 3 months ago and it was above 10. Hemoccult was positive.  Patient was admitted to the hospital, she was consulted by gastroenterologist and EGD was performed February 3 of by Dr. Vira Agar. It revealed erosive gastropathy, biopsy was performed and MRI compatible clip was placed, it also showed  gastritis, granular mucosa in duodenal bulb, but normal esophagus and second part of duodenum. Patient was managed on Protonix intravenously while in the hospital and her diet was slowly advanced. On the day of discharge she was able to tolerate regular diet with no significant discomfort, her hemoglobin remained stable after transfusion. It was felt that patient is stable to be discharged home with follow-up with her primary care physician and gastroenterologist as needed. Of note, since patient was somewhat short of breath. Cardiology consultation was also obtained, although her shortness of breath improved with 2 units of packed red blood cell transfusion.  Discussion by problem 1. acute posthemorrhagic anemia, due to acute on chronic blood loss, due to erosive gastropathy, gastritis, felt to be nonsteroidal anti-inflammatory medication related, status post EGD by Dr. Vira Agar on February 3: Status post 2 unit packed red blood cell transfusion, she was advised not to use any Motrin,  Voltaren , or any other NSAID . Continue Protonix twice daily, added Carafate 3 times daily per Dr. Percell Boston recommendations. Hemoglobin remained stable with regular diet, it is recommended to recheck hemoglobin as outpatient, follow-up with gastroenterologist, Dr. Vira Agar as needed. Patient underwent capsule endoscopy which was unremarkable, per telephone interview with Dr. Vira Agar on the day of discharge 2. Noncardiac Chest pain: No further episodes of chest pain , cardiologist saw patient in consultation, no other recommendations 3. Type 2 diabetes non-insulin-requiring: Resume oral agents at home doses, blood glucose is ranging between 90s to 160s , hemoglobin A1c was checked, was found to be 6.7 4. Hyperlipidemia unspecified: Statin therapy 5. Obstructive sleep apnea: Continue BiPAP at night 6. Morbid obesity: Nutrition counselingIs obtained, patient was advanced to soft diet  7. Thrombocytopenia, likely due to acute gastrointestinal bleed/compensatory, follow-up as outpatient   VITAL SIGNS:  Blood pressure 122/65, pulse 69, temperature 98 F (36.7 C), temperature source Oral, resp. rate 18, height 5' 7"  (1.702 m), weight 151.819 kg (334 lb 11.2 oz), SpO2 92 %.  I/O:   Intake/Output Summary (Last 24 hours) at 03/05/15 1438 Last data filed at 03/05/15 1200  Gross per 24 hour  Intake 2149.99 ml  Output      0 ml  Net 2149.99 ml    PHYSICAL EXAMINATION:  GENERAL:  60 y.o.-year-old patient lying in the bed with no acute distress.  EYES: Pupils equal, round, reactive to light and accommodation. No scleral icterus. Extraocular muscles intact.  HEENT: Head atraumatic, normocephalic. Oropharynx and nasopharynx clear.  NECK:  Supple, no jugular venous distention. No thyroid enlargement, no tenderness.  LUNGS: Normal breath sounds bilaterally, no wheezing, rales,rhonchi or crepitation. No use of accessory muscles of respiration.  CARDIOVASCULAR: S1, S2 normal. No murmurs, rubs, or  gallops.  ABDOMEN: Soft, non-tender, non-distended. Bowel sounds present. No organomegaly or mass.  EXTREMITIES: No pedal edema, cyanosis, or clubbing.  NEUROLOGIC: Cranial nerves II through XII are intact. Muscle strength 5/5 in all extremities. Sensation intact. Gait not checked.  PSYCHIATRIC: The patient is alert and oriented x 3.  SKIN: No obvious rash, lesion, or ulcer.   DATA REVIEW:   CBC  Recent Labs Lab 03/02/15 2354  03/05/15 0358  WBC 5.7  --   --   HGB 8.1*  < > 9.1*  HCT 26.8*  --   --   PLT 112*  --   --   < > = values in this interval not displayed.  Chemistries   Recent Labs Lab 03/02/15 0528  NA 142  K 4.3  CL 108  CO2 31  GLUCOSE 114*  BUN 16  CREATININE 0.93  CALCIUM 8.6*    Cardiac Enzymes  Recent Labs Lab 03/02/15 2354  TROPONINI <0.03    Microbiology Results  Results for orders placed or performed during the hospital encounter of 01/25/14  Culture, respiratory (NON-Expectorated)     Status: None   Collection Time: 01/26/14 12:59 PM  Result Value Ref Range Status   Specimen Description TRACHEAL ASPIRATE  Final   Special Requests Normal  Final   Gram Stain   Final    FEW WBC PRESENT,BOTH PMN AND MONONUCLEAR RARE SQUAMOUS EPITHELIAL CELLS PRESENT FEW GRAM POSITIVE COCCI IN PAIRS IN CLUSTERS Performed at [facility]    Culture   Final    Non-Pathogenic Oropharyngeal-type Flora Isolated. Performed at [facility]    Report Status 01/30/2014 FINAL  Final  Clostridium Difficile by PCR     Status: Abnormal   Collection Time: 01/27/14 10:49 AM  Result Value Ref Range Status   Toxigenic C Difficile by pcr POSITIVE (A) NEGATIVE Final    Comment: CRITICAL RESULT CALLED TO, READ BACK BY AND VERIFIED WITH: V. DAVIS RN 12:35 01/27/14 (wilsonm)   Culture, blood (routine x 2)     Status: None   Collection Time: 01/27/14  1:10 PM  Result Value Ref Range Status   Specimen Description BLOOD RIGHT HAND  Final   Special  Requests BOTTLES DRAWN AEROBIC ONLY 5CC  Final   Culture   Final    NO GROWTH 5 DAYS Performed at [facility]    Report Status 02/02/2014 FINAL  Final  Culture, blood (routine x 2)     Status: None   Collection Time: 01/27/14  3:10 PM  Result Value Ref Range Status   Specimen Description BLOOD RIGHT ARM  Final   Special Requests BOTTLES DRAWN AEROBIC ONLY 6CC  Final   Culture   Final    NO GROWTH 5 DAYS Performed at [facility]    Report Status 02/02/2014 FINAL  Final  Culture, respiratory (NON-Expectorated)     Status: None   Collection Time: 02/01/14  9:36 AM  Result Value Ref Range Status   Specimen Description TRACHEAL ASPIRATE  Final   Special Requests NONE  Final   Gram Stain   Final    MODERATE WBC PRESENT, PREDOMINANTLY PMN RARE SQUAMOUS EPITHELIAL CELLS PRESENT RARE GRAM POSITIVE COCCI IN PAIRS RARE GRAM NEGATIVE RODS Performed at [facility]    Culture   Final    Non-Pathogenic Oropharyngeal-type Flora Isolated. Performed at [facility]    Report Status 02/04/2014 FINAL  Final  Clostridium Difficile by PCR     Status: None   Collection Time: 02/03/14 12:10 PM  Result Value Ref Range Status   Toxigenic C Difficile by pcr NEGATIVE NEGATIVE Final  Culture, Urine     Status: None   Collection Time: 02/13/14  9:12 PM  Result Value Ref Range Status   Specimen Description URINE, RANDOM  Final   Special Requests NONE  Final   Colony Count   Final    >=100,000 COLONIES/ML Performed at [facility]    Culture   Final    ESCHERICHIA COLI Performed at [facility]    Report Status 02/16/2014 FINAL  Final   Organism ID, Bacteria ESCHERICHIA COLI  Final      Susceptibility   Escherichia coli - MIC*    AMPICILLIN >=32 RESISTANT Resistant     CEFAZOLIN >=64  RESISTANT Resistant     CEFTRIAXONE >=64 RESISTANT Resistant     CIPROFLOXACIN <=0.25 SENSITIVE Sensitive     GENTAMICIN <=1 SENSITIVE Sensitive      LEVOFLOXACIN <=0.12 SENSITIVE Sensitive     NITROFURANTOIN <=16 SENSITIVE Sensitive     TOBRAMYCIN <=1 SENSITIVE Sensitive     TRIMETH/SULFA <=20 SENSITIVE Sensitive     PIP/TAZO 64 INTERMEDIATE Intermediate     * ESCHERICHIA COLI  Culture, blood (routine x 2)     Status: None   Collection Time: 02/13/14 10:00 PM  Result Value Ref Range Status   Specimen Description BLOOD LEFT ARM  Final   Special Requests BOTTLES DRAWN AEROBIC AND ANAEROBIC 10CC EACH  Final   Culture   Final    NO GROWTH 5 DAYS Performed at [facility]    Report Status 02/20/2014 FINAL  Final  Culture, blood (routine x 2)     Status: None   Collection Time: 02/13/14 10:15 PM  Result Value Ref Range Status   Specimen Description BLOOD RIGHT ARM  Final   Special Requests BOTTLES DRAWN AEROBIC AND ANAEROBIC 10CCE [place]  Final   Culture   Final    NO GROWTH 5 DAYS Performed at [facility]    Report Status 02/20/2014 FINAL  Final  Culture, respiratory (NON-Expectorated)     Status: None   Collection Time: 02/13/14 10:15 PM  Result Value Ref Range Status   Specimen Description TRACHEAL ASPIRATE  Final   Special Requests NONE  Final   Gram Stain   Final    MODERATE WBC PRESENT,BOTH PMN AND MONONUCLEAR RARE SQUAMOUS EPITHELIAL CELLS PRESENT ABUNDANT GRAM POSITIVE RODS RARE GRAM POSITIVE COCCI IN PAIRS IN CLUSTERS    Culture   Final    Non-Pathogenic Oropharyngeal-type Flora Isolated. Performed at [facility]    Report Status 02/16/2014 FINAL  Final  Clostridium Difficile by PCR     Status: Abnormal   Collection Time: 02/14/14  9:20 AM  Result Value Ref Range Status   Toxigenic C Difficile by pcr POSITIVE (A) NEGATIVE Final    Comment: CRITICAL RESULT CALLED TO, READ BACK BY AND VERIFIED WITH: RGae Dry RN 11:15 02/14/14 (wilsonm)   Culture, respiratory (NON-Expectorated)     Status: None   Collection Time: 02/23/14  4:18 PM  Result Value Ref Range Status   Specimen Description  TRACHEAL ASPIRATE  Final   Special Requests NONE  Final   Gram Stain   Final    RARE WBC PRESENT, PREDOMINANTLY MONONUCLEAR RARE SQUAMOUS EPITHELIAL CELLS PRESENT NO ORGANISMS SEEN Performed at [facility] Performed at [place] Performed at [facility]    Report Status 02/26/2014 FINAL  Final  Stat Gram stain     Status: None   Collection Time: 02/23/14  4:19 PM  Result Value Ref Range Status   Specimen Description TRACHEAL ASPIRATE  Final   Special Requests NONE  Final   Gram Stain   Final    RARE WBC PRESENT, PREDOMINANTLY MONONUCLEAR NO ORGANISMS SEEN    Report Status 02/23/2014 FINAL  Final  Culture, Urine     Status: None   Collection Time: 03/11/14  2:10 PM  Result Value Ref Range Status   Specimen Description URINE, RANDOM  Final   Special Requests NONE  Final   Colony Count   Final    >=100,000 COLONIES/ML Performed at [facility]  Culture   Final    ESCHERICHIA COLI YEAST Performed at [facility]    Report Status 03/14/2014 FINAL  Final   Organism ID, Bacteria ESCHERICHIA COLI  Final      Susceptibility   Escherichia coli - MIC*    AMPICILLIN >=32 RESISTANT Resistant     CEFAZOLIN >=64 RESISTANT Resistant     CEFTRIAXONE >=64 RESISTANT Resistant     CIPROFLOXACIN <=0.25 SENSITIVE Sensitive     GENTAMICIN <=1 SENSITIVE Sensitive     LEVOFLOXACIN <=0.12 SENSITIVE Sensitive     NITROFURANTOIN <=16 SENSITIVE Sensitive     TOBRAMYCIN <=1 SENSITIVE Sensitive     TRIMETH/SULFA <=20 SENSITIVE Sensitive     * ESCHERICHIA COLI  Culture, blood (routine x 2)     Status: None   Collection Time: 03/11/14  3:10 PM  Result Value Ref Range Status   Specimen Description BLOOD RIGHT FOREARM  Final   Special Requests   Final    BOTTLES DRAWN AEROBIC AND ANAEROBIC 10CC AER,5CC ANA   Culture   Final    NO GROWTH 5 DAYS Performed at [facility]    Report Status  03/17/2014 FINAL  Final  Culture, blood (routine x 2)     Status: None   Collection Time: 03/11/14  3:15 PM  Result Value Ref Range Status   Specimen Description BLOOD LEFT WRIST  Final   Special Requests BOTTLES DRAWN AEROBIC AND ANAEROBIC 10CC  Final   Culture   Final    NO GROWTH 5 DAYS Performed at [facility]    Report Status 03/17/2014 FINAL  Final    RADIOLOGY:  No results found.  EKG:   Orders placed or performed during the hospital encounter of 03/01/15  . ED EKG  . ED EKG      Management plans discussed with the patient, family and they are in agreement.  CODE STATUS:     Code Status Orders        Start     Ordered   03/01/15 2206  Full code   Continuous     03/01/15 2205    Code Status History    Date Active Date Inactive Code Status Order ID Comments User Context   01/25/2014  7:09 PM 03/15/2014  4:32 PM Full Code [id number]  Carney Harder Inpatient    Advance Directive Documentation        Most Recent Value   Type of Advance Directive  Living will   Pre-existing out of facility DNR order (yellow form or pink MOST form)     "MOST" Form in Place?        TOTAL TIME TAKING CARE OF THIS PATIENT: 40 minutes.  Discussed with Dr. Vira Agar.  Theodoro Grist M.D on 03/05/2015 at 2:38 PM  Between 7am to 6pm - Pager - [phone]  After 6pm go to [email or web address] - password [email or web address]  Office  [phone]  CC: Primary care physician; Ellamae Sia, MD

## 2015-03-06 LAB — SURGICAL PATHOLOGY

## 2015-03-07 NOTE — Op Note (Signed)
[facility] Gastroenterology Patient Name: Theresa Leon Procedure Date: 08/04/2014 10:54 AM MRN: [id number] Account #: [id number] Date of Birth: [birth year] Admit Type: Outpatient Age: 60 Room: [facility] ENDO ROOM 1 Gender: Female Note Status: Supervisor Override Procedure:         Colonoscopy Indications:       Iron deficiency anemia Providers:         Manya Silvas, MD Referring MD:      Ellin Goodie, MD (Referring MD) Medicines:         Propofol per Anesthesia Complications:     No immediate complications. Procedure:         Pre-Anesthesia Assessment:                    - After reviewing the risks and benefits, the patient was                     deemed in satisfactory condition to undergo the procedure.                    After obtaining informed consent, the colonoscope was                     passed under direct vision. Throughout the procedure, the                     patient's blood pressure, pulse, and oxygen saturations                     were monitored continuously. The Colonoscope was                     introduced through the anus and advanced to the the cecum,                     identified by appendiceal orifice and ileocecal valve. The                     colonoscopy was performed without difficulty. The patient                     tolerated the procedure well. The quality of the bowel                     preparation was excellent. Findings:      A diminutive polyp was found in the sigmoid colon. The polyp was       sessile. The polyp was removed with a cold snare. Resection and       retrieval were complete.      A few small-mouthed diverticula were found in the sigmoid colon.      Internal hemorrhoids were found during endoscopy. The hemorrhoids were       small and Grade I (internal hemorrhoids that do not prolapse).      The exam was otherwise without abnormality. Impression:        - One diminutive polyp in the sigmoid colon. Resected and                    retrieved.                    - Diverticulosis in the sigmoid colon.                    - Internal  hemorrhoids.                    - The examination was otherwise normal. Recommendation:    - Await pathology results. Manya Silvas, MD 08/04/2014 11:33:51 AM This report has been signed electronically. Number of Addenda: 0 Note Initiated On: 08/04/2014 10:54 AM Scope Withdrawal Time: 0 hours 9 minutes 47 seconds  Total Procedure Duration: 0 hours 11 minutes 37 seconds       [facility]

## 2015-03-11 LAB — TYPE AND SCREEN
ABO/RH(D): A POS
ANTIBODY SCREEN: NEGATIVE
DONOR AG TYPE: NEGATIVE
Donor AG Type: NEGATIVE
UNIT DIVISION: 0
UNIT DIVISION: 0

## 2015-04-22 ENCOUNTER — Emergency Department
Admission: EM | Admit: 2015-04-22 | Discharge: 2015-04-22 | Disposition: A | Discharge status: Home / Self Care | Payer: Medicare Other | Attending: Emergency Medicine | Admitting: Emergency Medicine

## 2015-04-22 ENCOUNTER — Encounter: Payer: Self-pay | Admitting: Emergency Medicine

## 2015-04-22 DIAGNOSIS — E119 Type 2 diabetes mellitus without complications: Secondary | ICD-10-CM | POA: Insufficient documentation

## 2015-04-22 DIAGNOSIS — Z711 Person with feared health complaint in whom no diagnosis is made: Secondary | ICD-10-CM | POA: Diagnosis not present

## 2015-04-22 DIAGNOSIS — Y9389 Activity, other specified: Secondary | ICD-10-CM | POA: Diagnosis not present

## 2015-04-22 DIAGNOSIS — T502X1A Poisoning by carbonic-anhydrase inhibitors, benzothiadiazides and other diuretics, accidental (unintentional), initial encounter: Secondary | ICD-10-CM | POA: Diagnosis present

## 2015-04-22 DIAGNOSIS — Y9289 Other specified places as the place of occurrence of the external cause: Secondary | ICD-10-CM | POA: Diagnosis not present

## 2015-04-22 DIAGNOSIS — Y998 Other external cause status: Secondary | ICD-10-CM | POA: Diagnosis not present

## 2015-04-22 DIAGNOSIS — D649 Anemia, unspecified: Secondary | ICD-10-CM

## 2015-04-22 DIAGNOSIS — Z87891 Personal history of nicotine dependence: Secondary | ICD-10-CM | POA: Diagnosis not present

## 2015-04-22 DIAGNOSIS — Z79899 Other long term (current) drug therapy: Secondary | ICD-10-CM | POA: Diagnosis not present

## 2015-04-22 DIAGNOSIS — Z7984 Long term (current) use of oral hypoglycemic drugs: Secondary | ICD-10-CM | POA: Diagnosis not present

## 2015-04-22 LAB — COMPREHENSIVE METABOLIC PANEL
ALBUMIN: 3.6 g/dL (ref 3.5–5.0)
ALT: 11 U/L — ABNORMAL LOW (ref 14–54)
ANION GAP: 5 (ref 5–15)
AST: 21 U/L (ref 15–41)
Alkaline Phosphatase: 84 U/L (ref 38–126)
BILIRUBIN TOTAL: 1.9 mg/dL — AB (ref 0.3–1.2)
BUN: 9 mg/dL (ref 6–20)
CHLORIDE: 100 mmol/L — AB (ref 101–111)
CO2: 32 mmol/L (ref 22–32)
Calcium: 8.8 mg/dL — ABNORMAL LOW (ref 8.9–10.3)
Creatinine, Ser: 1.12 mg/dL — ABNORMAL HIGH (ref 0.44–1.00)
GFR calc Af Amer: >60 mL/min (ref >60)
GFR calc non Af Amer: 53 mL/min — ABNORMAL LOW (ref >60)
Glucose, Bld: 145 mg/dL — ABNORMAL HIGH (ref 65–99)
POTASSIUM: 3.8 mmol/L (ref 3.5–5.1)
Sodium: 137 mmol/L (ref 135–145)
TOTAL PROTEIN: 8 g/dL (ref 6.5–8.1)

## 2015-04-22 LAB — CBC
HEMATOCRIT: 27.4 % — AB (ref 35.0–47.0)
Hemoglobin: 8.4 g/dL — ABNORMAL LOW (ref 12.0–16.0)
MCH: 21.7 pg — ABNORMAL LOW (ref 26.0–34.0)
MCHC: 30.8 g/dL — AB (ref 32.0–36.0)
MCV: 70.6 fL — ABNORMAL LOW (ref 80.0–100.0)
PLATELETS: 160 10*3/uL (ref 150–440)
RBC: 3.88 MIL/uL (ref 3.80–5.20)
RDW: 19.1 % — AB (ref 11.5–14.5)
WBC: 6.8 10*3/uL (ref 3.6–11.0)

## 2015-04-22 NOTE — ED Notes (Signed)
Pt. States she accidentally took two HCTZ pills this morning at 11am.  Pt. Thought she took two hydrocodone.  Pt. States she is anemic.  Pt. States during afternoon she felt light-headed and a feeling of nausea.  Pt. Now presents with lower abdominal cramping.

## 2015-04-22 NOTE — Discharge Instructions (Signed)
Anemia, Nonspecific Anemia is a condition in which the concentration of red blood cells or hemoglobin in the blood is below normal. Hemoglobin is a substance in red blood cells that carries oxygen to the tissues of the body. Anemia results in not enough oxygen reaching these tissues.  CAUSES  Common causes of anemia include:   Excessive bleeding. Bleeding may be internal or external. This includes excessive bleeding from periods (in women) or from the intestine.   Poor nutrition.   Chronic kidney, thyroid, and liver disease.  Bone marrow disorders that decrease red blood cell production.  Cancer and treatments for cancer.  HIV, AIDS, and their treatments.  Spleen problems that increase red blood cell destruction.  Blood disorders.  Excess destruction of red blood cells due to infection, medicines, and autoimmune disorders. SIGNS AND SYMPTOMS   Minor weakness.   Dizziness.   Headache.  Palpitations.   Shortness of breath, especially with exercise.   Paleness.  Cold sensitivity.  Indigestion.  Nausea.  Difficulty sleeping.  Difficulty concentrating. Symptoms may occur suddenly or they may develop slowly.  DIAGNOSIS  Additional blood tests are often needed. These help your health care provider determine the best treatment. Your health care provider will check your stool for blood and look for other causes of blood loss.  TREATMENT  Treatment varies depending on the cause of the anemia. Treatment can include:   Supplements of iron, vitamin B12, or folic acid.   Hormone medicines.   A blood transfusion. This may be needed if blood loss is severe.   Hospitalization. This may be needed if there is significant continual blood loss.   Dietary changes.  Spleen removal. HOME CARE INSTRUCTIONS Keep all follow-up appointments. It often takes many weeks to correct anemia, and having your health care provider check on your condition and your response to  treatment is very important. SEEK IMMEDIATE MEDICAL CARE IF:   You develop extreme weakness, shortness of breath, or chest pain.   You become dizzy or have trouble concentrating.  You develop heavy vaginal bleeding.   You develop a rash.   You have bloody or black, tarry stools.   You faint.   You vomit up blood.   You vomit repeatedly.   You have abdominal pain.  You have a fever or persistent symptoms for more than 2-3 days.   You have a fever and your symptoms suddenly get worse.   You are dehydrated.  MAKE SURE YOU:  Understand these instructions.  Will watch your condition.  Will get help right away if you are not doing well or get worse.   This information is not intended to replace advice given to you by your health care provider. Make sure you discuss any questions you have with your health care provider.   Document Released: 02/22/2004 Document Revised: 09/16/2012 Document Reviewed: 07/10/2012 Elsevier Interactive Patient Education 2016 [employer]  

## 2015-04-22 NOTE — ED Notes (Signed)
Pt to ed with c/o taking meds this am that she was not supposed to take,  Pt states she took 2 tablets HCTZ at 11 am today,  Reports she was trying to take 2 norco but mistook the HCTZ.  Pt states she is now having abd burning and nausea that started about 1 1/2 hours pta.  Pt reports she is allergic to HCTZ and lasix,  Reports she was admitted to hospital after being on them dx with "renal shutdown".  Pt appears in nad.

## 2015-04-22 NOTE — ED Notes (Signed)
AAOx3.  Skin warm and dry.  NAD 

## 2015-04-22 NOTE — ED Provider Notes (Signed)
[facility] Emergency Department Provider Note  ____________________________________________  Time seen: Approximately 7:42 PM  I have reviewed the triage vital signs and the nursing notes.   HISTORY  Chief Complaint Ingestion    HPI Theresa Leon is a 60 y.o. female who presents emergency department fearing that she is in renal failure. Patient states that she took 2 HCTZ tablets this morning at 11. She states that previously she was on HCTZ and Lasix and was told that if she continued same she would go into renal failure. Patient accidentally took 2 HCTZ instead of 2 Norco this morning and became concerned that she was going into renal failure. Patient denies any symptoms at this time. She denies any polyuria, dysuria, hematuria, abdominal pain, nausea or vomiting.   Past Medical History  Diagnosis Date  . Asthma 1975  . Diabetes mellitus without complication ([place]) 9470  . Heart disease   . 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 COPD (chronic obstructive pulmonary disease) ([place])   . OSA (obstructive sleep apnea)   . Diastolic CHF, acute on chronic (HCC)   . Acute on chronic renal failure ([place])   . Anemia   . Diabetes ([place])   . H/O aortic valve replacement   . Morbid obesity ([place])   . Hypothyroid     Patient Active Problem List   Diagnosis Date Noted  . Gastritis due to nonsteroidal anti-inflammatory drug (NSAID) 03/05/2015  . History of esophagogastroduodenoscopy (EGD) 03/05/2015  . Type 2 diabetes mellitus ([place]) 03/01/2015  . GERD (gastroesophageal reflux disease) 03/01/2015  . HLD (hyperlipidemia) 03/01/2015  . COPD (chronic obstructive pulmonary disease) ([place]) 03/01/2015  . Chronic diastolic CHF (congestive heart failure) ([place]) 03/01/2015  . Hypothyroidism 03/01/2015  . OSA on CPAP 03/01/2015  . Acute posthemorrhagic anemia 03/01/2015  . Dysphagia   . CHF (congestive heart failure) ([place])   . Acute  respiratory failure with hypoxia ([place]) 01/26/2014  . Encounter for imaging study to confirm orogastric (OG) tube placement   . Respiratory failure ([place])   . 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 Back surgery      AS CHILD  . Phrenic nerve pacemaker implantation  2009  . Aortic valve relaced   2008  . Cholecystectomy  2007  . Breast surgery  September 25, 2011    intraductal papilloma of the left breast  . Tracheostomy tube placement N/A 02/04/2014    Procedure: TRACHEOSTOMY;  Surgeon: Rozetta Nunnery, MD;  Location: [place];  Service: ENT;  Laterality: N/A;  . Colonoscopy with propofol N/A 08/04/2014    Procedure: COLONOSCOPY WITH PROPOFOL;  Surgeon: Manya Silvas, MD;  Location[facility] ENDOSCOPY;  Service: Endoscopy;  Laterality: N/A;  . Esophagogastroduodenoscopy (egd) with propofol  08/04/2014    Procedure: ESOPHAGOGASTRODUODENOSCOPY (EGD) WITH PROPOFOL;  Surgeon: Manya Silvas, MD;  Location: [facility] ENDOSCOPY;  Service: Endoscopy;;  . Esophagogastroduodenoscopy Left 03/03/2015    Procedure: ESOPHAGOGASTRODUODENOSCOPY (EGD);  Surgeon: Manya Silvas, MD;  Location: [facility] ENDOSCOPY;  Service: Endoscopy;  Laterality: Left;    Current Outpatient Rx  Name  Route  Sig  Dispense  Refill  . acetaminophen (TYLENOL) 500 MG tablet   Oral   Take 1,000 mg by mouth every 6 (six) hours as needed for mild pain or headache.         . albuterol (PROVENTIL HFA;VENTOLIN HFA) 108 (90 BASE)  MCG/ACT inhaler   Inhalation   Inhale 2 puffs into the lungs every 6 (six) hours as needed for wheezing or shortness of breath.          Marland Kitchen atorvastatin (LIPITOR) 10 MG tablet   Oral   Take 10 mg by mouth at bedtime.         . Calcium Carbonate-Vitamin D (CALCIUM 600+D) 600-400 MG-UNIT tablet   Oral   Take 1 tablet by mouth at bedtime.         . cholecalciferol (VITAMIN D) 1000 units tablet   Oral   Take 1,000 Units by mouth daily.          . citalopram (CELEXA) 40 MG tablet   Oral   Take 40 mg by mouth daily.         . cyclobenzaprine (FLEXERIL) 10 MG tablet   Oral   Take 1 tablet (10 mg total) by mouth every 8 (eight) hours as needed for muscle spasms.   30 tablet   1   . docusate sodium (COLACE) 100 MG capsule   Oral   Take 100 mg by mouth 2 (two) times daily as needed for mild constipation.         Marland Kitchen EPINEPHrine (EPIPEN 2-PAK) 0.3 mg/0.3 mL IJ SOAJ injection   Intramuscular   Inject 0.3 mg into the muscle once as needed (for severe allergic reaction).         . fluticasone (FLONASE) 50 MCG/ACT nasal spray   Each Nare   Place 2 sprays into both nostrils daily as needed for rhinitis.          Marland Kitchen gabapentin (NEURONTIN) 600 MG tablet   Oral   Take 600 mg by mouth 3 (three) times daily.         Marland Kitchen levothyroxine (SYNTHROID, LEVOTHROID) 100 MCG tablet   Oral   Take 100 mcg by mouth daily before breakfast.          . loratadine (CLARITIN) 10 MG tablet   Oral   Take 10 mg by mouth daily.         . metFORMIN (GLUCOPHAGE-XR) 500 MG 24 hr tablet   Oral   Take 500 mg by mouth daily with breakfast.         . montelukast (SINGULAIR) 10 MG tablet   Oral   Take 10 mg by mouth at bedtime.         Marland Kitchen oxyCODONE (OXY IR/ROXICODONE) 5 MG immediate release tablet   Oral   Take 5 mg by mouth 3 (three) times daily as needed for severe pain.         Marland Kitchen oxyCODONE-acetaminophen (ROXICET) 5-325 MG tablet   Oral   Take 1 tablet by mouth every 6 (six) hours as needed. Patient not taking: Reported on 03/01/2015   12 tablet   0   . pantoprazole (PROTONIX) 40 MG tablet   Oral   Take 1 tablet (40 mg total) by mouth 2 (two) times daily. Switch for any other PPI at similar dose and frequency   60 tablet   6   . pregabalin (LYRICA) 150 MG capsule   Oral   Take 150 mg by mouth 2 (two) times daily.         . sucralfate (CARAFATE) 1 g tablet   Oral   Take 1 tablet (1 g total) by mouth 4 (four) times  daily -  with meals and at bedtime.   90 tablet   6   .  tiZANidine (ZANAFLEX) 4 MG tablet   Oral   Take 1 tablet (4 mg total) by mouth every 6 (six) hours as needed for muscle spasms. Patient not taking: Reported on 03/01/2015   30 tablet   0   . traZODone (DESYREL) 50 MG tablet   Oral   Take 50 mg by mouth at bedtime.            Allergies Furosemide; Mold extract; Tetanus toxoids; Qvar; Toprol xl; Hctz; Nitroglycerin; and Other  Family History  Problem Relation Age of Onset  . Skin telangiectasia Mother   . Lung cancer Maternal Grandmother   . Lung cancer Paternal Grandmother   . Breast cancer Maternal Grandmother     Social History Social History  Substance Use Topics  . Smoking status: Former Research scientist (life sciences)  . Smokeless tobacco: None  . Alcohol Use: No     Review of Systems  Constitutional: No fever/chills Cardiovascular: no chest pain. Respiratory: no cough. No SOB. Gastrointestinal: No abdominal pain.  No nausea, no vomiting.  No diarrhea.  No constipation. Genitourinary: Negative for dysuria. No hematuria. No polyuria. Musculoskeletal: Negative for myalgias. Skin: Negative for rash. Neurological: Negative for headaches, focal weakness or numbness. 10-point ROS otherwise negative.  ____________________________________________   PHYSICAL EXAM:  VITAL SIGNS: ED Triage Vitals  Enc Vitals Group     BP 04/22/15 1839 123/69 mmHg     Pulse Rate 04/22/15 1839 81     Resp 04/22/15 1839 20     Temp 04/22/15 1839 99.1 F (37.3 C)     Temp Source 04/22/15 1839 Oral     SpO2 04/22/15 1839 97 %     Weight 04/22/15 1839 334 lb (151.501 kg)     Height --      Head Cir --      Peak Flow --      Pain Score 04/22/15 1839 7     Pain Loc --      Pain Edu? --      Excl. in [place]? --      Constitutional: Alert and oriented. Well appearing and in no acute distress. Cardiovascular: Normal rate, regular rhythm. Normal S1 and S2.  Good peripheral circulation. Respiratory:  Normal respiratory effort without tachypnea or retractions. Lungs CTAB. Gastrointestinal: Bowel sounds 4 quadrants. Soft and nontender. No guarding or rigidity. No distention. No CVA tenderness. Musculoskeletal: No lower extremity tenderness nor edema.  No joint effusions. Neurologic:  Normal speech and language. No gross focal neurologic deficits are appreciated.  Skin:  Skin is warm, dry and intact. No rash noted. Psychiatric: Mood and affect are normal. Speech and behavior are normal. Patient exhibits appropriate insight and judgement.   ____________________________________________   LABS (all labs ordered are listed, but only abnormal results are displayed)  Labs Reviewed  CBC - Abnormal; Notable for the following:    Hemoglobin 8.4 (*)    HCT 27.4 (*)    MCV 70.6 (*)    MCH 21.7 (*)    MCHC 30.8 (*)    RDW 19.1 (*)    All other components within normal limits  COMPREHENSIVE METABOLIC PANEL - Abnormal; Notable for the following:    Chloride 100 (*)    Glucose, Bld 145 (*)    Creatinine, Ser 1.12 (*)    Calcium 8.8 (*)    ALT 11 (*)    Total Bilirubin 1.9 (*)    GFR calc non Af Amer 53 (*)    All other components within normal limits  ____________________________________________  EKG   ____________________________________________  RADIOLOGY   No results found.  ____________________________________________    PROCEDURES  Procedure(s) performed:       Medications - No data to display   ____________________________________________   INITIAL IMPRESSION / ASSESSMENT AND PLAN / ED COURSE  Pertinent labs & imaging results that were available during my care of the patient were reviewed by me and considered in my medical decision making (see chart for details).  Patient's diagnosis is consistent with Person with fear of complaint with no diagnosis. Patient was concerned that she was in renal failure due to taking 2 HCTZ pills, but CMP returns with  reassuring results. Exam is reassuring as well. Patient's CBC returns with significant anemia. This is a known diagnosis and is being followed by the cancer center/hematology.  Patient is to follow up with primary care provider for any further concerns. Patient is given ED precautions to return to the ED for any worsening or new symptoms.     ____________________________________________  FINAL CLINICAL IMPRESSION(S) / ED DIAGNOSES  Final diagnoses:  Person with feared complaint, no diagnosis made  Anemia, unspecified anemia type      NEW MEDICATIONS STARTED DURING THIS VISIT:  New Prescriptions   No medications on file        This chart was dictated using voice recognition software/Dragon. Despite best efforts to proofread, errors can occur which can change the meaning. Any change was purely unintentional.    Darletta Moll, PA-C 04/22/15 [place], MD 04/22/15 [phone]

## 2015-05-03 ENCOUNTER — Inpatient Hospital Stay: Payer: Medicare Other

## 2015-05-03 ENCOUNTER — Encounter: Payer: Self-pay | Admitting: Internal Medicine

## 2015-05-03 ENCOUNTER — Inpatient Hospital Stay: Payer: Medicare Other | Attending: Internal Medicine | Admitting: Internal Medicine

## 2015-05-03 VITALS — BP 132/74 | HR 81 | Temp 100.2°F | Resp 18 | Wt 330.0 lb

## 2015-05-03 DIAGNOSIS — Z803 Family history of malignant neoplasm of breast: Secondary | ICD-10-CM | POA: Diagnosis not present

## 2015-05-03 DIAGNOSIS — G4733 Obstructive sleep apnea (adult) (pediatric): Secondary | ICD-10-CM | POA: Diagnosis not present

## 2015-05-03 DIAGNOSIS — E119 Type 2 diabetes mellitus without complications: Secondary | ICD-10-CM | POA: Diagnosis not present

## 2015-05-03 DIAGNOSIS — J449 Chronic obstructive pulmonary disease, unspecified: Secondary | ICD-10-CM

## 2015-05-03 DIAGNOSIS — D500 Iron deficiency anemia secondary to blood loss (chronic): Secondary | ICD-10-CM

## 2015-05-03 DIAGNOSIS — K922 Gastrointestinal hemorrhage, unspecified: Secondary | ICD-10-CM | POA: Diagnosis not present

## 2015-05-03 DIAGNOSIS — E039 Hypothyroidism, unspecified: Secondary | ICD-10-CM

## 2015-05-03 DIAGNOSIS — Z801 Family history of malignant neoplasm of trachea, bronchus and lung: Secondary | ICD-10-CM

## 2015-05-03 DIAGNOSIS — Z87891 Personal history of nicotine dependence: Secondary | ICD-10-CM | POA: Diagnosis not present

## 2015-05-03 DIAGNOSIS — Z79899 Other long term (current) drug therapy: Secondary | ICD-10-CM

## 2015-05-03 LAB — RETICULOCYTES
RBC.: 3.8 MIL/uL (ref 3.80–5.20)
RETIC CT PCT: 4.8 % — AB (ref 0.4–3.1)
Retic Count, Absolute: 182.4 10*3/uL (ref 19.0–183.0)

## 2015-05-03 LAB — CBC WITH DIFFERENTIAL/PLATELET
BASOS ABS: 0 10*3/uL (ref 0–0.1)
Basophils Relative: 1 %
EOS PCT: 3 %
Eosinophils Absolute: 0.2 10*3/uL (ref 0–0.7)
HEMATOCRIT: 28.4 % — AB (ref 35.0–47.0)
Hemoglobin: 8.7 g/dL — ABNORMAL LOW (ref 12.0–16.0)
LYMPHS ABS: 1.8 10*3/uL (ref 1.0–3.6)
Lymphocytes Relative: 36 %
MCH: 22.9 pg — AB (ref 26.0–34.0)
MCHC: 30.7 g/dL — ABNORMAL LOW (ref 32.0–36.0)
MCV: 74.7 fL — AB (ref 80.0–100.0)
MONO ABS: 0.3 10*3/uL (ref 0.2–0.9)
Monocytes Relative: 7 %
NEUTROS ABS: 2.7 10*3/uL (ref 1.4–6.5)
NEUTROS PCT: 53 %
PLATELETS: 141 10*3/uL — AB (ref 150–440)
RBC: 3.8 MIL/uL (ref 3.80–5.20)
RDW: 23 % — AB (ref 11.5–14.5)
WBC: 4.9 10*3/uL (ref 3.6–11.0)

## 2015-05-03 LAB — COMPREHENSIVE METABOLIC PANEL
ALT: 11 U/L — ABNORMAL LOW (ref 14–54)
ANION GAP: 3 — AB (ref 5–15)
AST: 19 U/L (ref 15–41)
Albumin: 3.4 g/dL — ABNORMAL LOW (ref 3.5–5.0)
Alkaline Phosphatase: 73 U/L (ref 38–126)
BUN: 14 mg/dL (ref 6–20)
CHLORIDE: 100 mmol/L — AB (ref 101–111)
CO2: 32 mmol/L (ref 22–32)
Calcium: 8.8 mg/dL — ABNORMAL LOW (ref 8.9–10.3)
Creatinine, Ser: 1.11 mg/dL — ABNORMAL HIGH (ref 0.44–1.00)
GFR, EST NON AFRICAN AMERICAN: 54 mL/min — AB (ref >60)
Glucose, Bld: 200 mg/dL — ABNORMAL HIGH (ref 65–99)
Potassium: 4 mmol/L (ref 3.5–5.1)
SODIUM: 135 mmol/L (ref 135–145)
Total Bilirubin: 1.3 mg/dL — ABNORMAL HIGH (ref 0.3–1.2)
Total Protein: 7.4 g/dL (ref 6.5–8.1)

## 2015-05-03 LAB — IRON AND TIBC
IRON: 195 ug/dL — AB (ref 28–170)
Saturation Ratios: 43 % — ABNORMAL HIGH (ref 10.4–31.8)
TIBC: 459 ug/dL — AB (ref 250–450)
UIBC: 264 ug/dL

## 2015-05-03 LAB — FERRITIN: Ferritin: 14 ng/mL (ref 11–307)

## 2015-05-03 LAB — LACTATE DEHYDROGENASE: LDH: 164 U/L (ref 98–192)

## 2015-05-03 NOTE — Progress Notes (Signed)
[place] CONSULT NOTE  Patient Care Team: Harriett Neita Carp, MD as PCP - General (Internal Medicine) Robert Bellow, MD (General Surgery)  CHIEF COMPLAINTS/PURPOSE OF CONSULTATION:   # IRON DEFICIENCY ANEMIA sec to chronic GI blood loss; Recm IV ferrahem  # Chronic GI blood loss- EDG/capsule [feb 2017]- gastrtitis ? NSAIDs/COLO-July 2016/ [Dr.Elliot]  HISTORY OF PRESENTING ILLNESS:  Theresa Leon 60 y.o.  female morbidly obese multiple medical problems including history of heart valve/bovine- not on any anticoagulation was recently admitted to the hospital for severe anemia hemoglobin around 7. Patient had Hemoccult-positive stools for which she underwent EGD-that showed gastritis and also had capsule study that was unremarkable. Patient received blood transfusion.  Patient continues to feel tired. Denies any blood in stools. She is in by mouth iron-that makes her stools black. Otherwise no nausea no vomiting. No abdominal pain or constipation.   ROS: A complete 10 point review of system is done which is negative except mentioned above in history of present illness  MEDICAL HISTORY:  Past Medical History  Diagnosis Date  . Asthma 1975  . Diabetes mellitus without complication ([place]) 8341  . Heart disease   . 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 COPD (chronic obstructive pulmonary disease) ([place])   . OSA (obstructive sleep apnea)   . Diastolic CHF, acute on chronic (HCC)   . Acute on chronic renal failure ([place])   . Anemia   . Diabetes ([place])   . H/O aortic valve replacement   . Morbid obesity ([place])   . Hypothyroid     SURGICAL HISTORY: Past Surgical History  Procedure Laterality Date  . Back surgery      AS CHILD  . Phrenic nerve pacemaker implantation  2009  . Aortic valve relaced   2008  . Cholecystectomy  2007  . Breast surgery  September 25, 2011    intraductal papilloma of the left breast  . Tracheostomy tube  placement N/A 02/04/2014    Procedure: TRACHEOSTOMY;  Surgeon: Rozetta Nunnery, MD;  Location: [facility];  Service: ENT;  Laterality: N/A;  . Colonoscopy with propofol N/A 08/04/2014    Procedure: COLONOSCOPY WITH PROPOFOL;  Surgeon: Manya Silvas, MD;  Location: [facility] ENDOSCOPY;  Service: Endoscopy;  Laterality: N/A;  . Esophagogastroduodenoscopy (egd) with propofol  08/04/2014    Procedure: ESOPHAGOGASTRODUODENOSCOPY (EGD) WITH PROPOFOL;  Surgeon: Manya Silvas, MD;  Location: [facility] ENDOSCOPY;  Service: Endoscopy;;  . Esophagogastroduodenoscopy Left 03/03/2015    Procedure: ESOPHAGOGASTRODUODENOSCOPY (EGD);  Surgeon: Manya Silvas, MD;  Location: [facility] ENDOSCOPY;  Service: Endoscopy;  Laterality: Left;    SOCIAL HISTORY: Social History   Social History  . Marital Status: Widowed    Spouse Name: N/A  . Number of Children: N/A  . Years of Education: N/A   Occupational History  . Not on file.   Social History Main Topics  . Smoking status: Former Research scientist (life sciences)  . Smokeless tobacco: Not on file  . Alcohol Use: No  . Drug Use: No  . Sexual Activity: Not on file   Other Topics Concern  . Not on file   Social History Narrative    FAMILY HISTORY: Family History  Problem Relation Age of Onset  . Skin telangiectasia Mother   . Lung cancer Maternal Grandmother   . Lung cancer Paternal Grandmother   . Breast cancer Maternal Grandmother     ALLERGIES:  is allergic to furosemide; mold extract;  tetanus toxoids; qvar; toprol xl; hctz; nitroglycerin; and other.  MEDICATIONS:  Current Outpatient Prescriptions  Medication Sig Dispense Refill  . acetaminophen (TYLENOL) 500 MG tablet Take 1,000 mg by mouth every 6 (six) hours as needed for mild pain or headache.    . albuterol (PROVENTIL HFA;VENTOLIN HFA) 108 (90 BASE) MCG/ACT inhaler Inhale 2 puffs into the lungs every 6 (six) hours as needed for wheezing or shortness of breath.     Marland Kitchen atorvastatin (LIPITOR) 10 MG tablet Take 10 mg by mouth  at bedtime.    . Calcium Carbonate-Vitamin D (CALCIUM 600+D) 600-400 MG-UNIT tablet Take 1 tablet by mouth at bedtime.    . cholecalciferol (VITAMIN D) 1000 units tablet Take 1,000 Units by mouth daily.    . citalopram (CELEXA) 40 MG tablet Take 40 mg by mouth daily.    Marland Kitchen docusate sodium (COLACE) 100 MG capsule Take 100 mg by mouth 2 (two) times daily as needed for mild constipation.    Marland Kitchen EPINEPHrine (EPIPEN 2-PAK) 0.3 mg/0.3 mL IJ SOAJ injection Inject 0.3 mg into the muscle once as needed (for severe allergic reaction).    . fluticasone (FLONASE) 50 MCG/ACT nasal spray Place 2 sprays into both nostrils daily as needed for rhinitis.     Marland Kitchen gabapentin (NEURONTIN) 600 MG tablet Take 600 mg by mouth 4 (four) times daily.     Marland Kitchen levothyroxine (SYNTHROID, LEVOTHROID) 100 MCG tablet Take 100 mcg by mouth daily before breakfast.     . loratadine (CLARITIN) 10 MG tablet Take 10 mg by mouth daily.    . metFORMIN (GLUCOPHAGE-XR) 500 MG 24 hr tablet Take 500 mg by mouth daily with breakfast.    . montelukast (SINGULAIR) 10 MG tablet Take 10 mg by mouth at bedtime.    Marland Kitchen oxyCODONE (OXY IR/ROXICODONE) 5 MG immediate release tablet Take 5 mg by mouth 3 (three) times daily as needed for severe pain.    . pantoprazole (PROTONIX) 40 MG tablet Take 1 tablet (40 mg total) by mouth 2 (two) times daily. Switch for any other PPI at similar dose and frequency 60 tablet 6  . sucralfate (CARAFATE) 1 g tablet Take 1 tablet (1 g total) by mouth 4 (four) times daily -  with meals and at bedtime. 90 tablet 6  . traZODone (DESYREL) 50 MG tablet Take 50 mg by mouth at bedtime.      No current facility-administered medications for this visit.      Marland Kitchen  PHYSICAL EXAMINATION:   Filed Vitals:   05/03/15 1037  BP: 132/74  Pulse: 81  Temp: 100.2 F (37.9 C)  Resp: 18   Filed Weights   05/03/15 1037  Weight: 330 lb 0.5 oz (149.7 kg)    GENERAL: Well-nourished well-developed; Alert, no distress and comfortable. Obese in  a wheel chair.   EYES: positive for pallor OROPHARYNX: no thrush or ulceration. NECK: supple, no masses felt LYMPH:  no palpable lymphadenopathy in the cervical, axillary or inguinal regions LUNGS: clear to auscultation and  No wheeze or crackles HEART/CVS: regular rate & rhythm and no murmurs; 1 plus bil lower extremity edema ABDOMEN: abdomen soft, non-tender and normal bowel sounds Musculoskeletal:no cyanosis of digits and no clubbing  PSYCH: alert & oriented x 3 with fluent speech NEURO: no focal motor/sensory deficits SKIN:  no rashes or significant lesions  LABORATORY DATA:  I have reviewed the data as listed Lab Results  Component Value Date   WBC 6.8 04/22/2015   HGB 8.4* 04/22/2015  HCT 27.4* 04/22/2015   MCV 70.6* 04/22/2015   PLT 160 04/22/2015    Recent Labs  03/01/15 1613 03/02/15 0528 04/22/15 1847  NA 139 142 137  K 4.1 4.3 3.8  CL 103 108 100*  CO2 32 31 32  GLUCOSE 141* 114* 145*  BUN 18 16 9   CREATININE 1.07* 0.93 1.12*  CALCIUM 9.0 8.6* 8.8*  GFRNONAA 56* >60 53*  GFRAA >60 >60 >60  PROT  --   --  8.0  ALBUMIN  --   --  3.6  AST  --   --  21  ALT  --   --  11*  ALKPHOS  --   --  84  BILITOT  --   --  1.9*     ASSESSMENT & PLAN:   #  IRON DEFICIENCY ANEMIA- Most recent hemoglobin 8.3 MCV 77; recommend checking iron studies ferritin CBC today. Also recommend IV Feraheme 2 weekly. I suspect given her chronic GI bleed- undiagnosed source; she'll continue to need maintenance IV iron.   # Chronic GI bleed- EGD colonoscopy capsule study [Feb /2017; Dr. Elliot]. Continue follow-up with GI.  # Patient follow-up with me in approximately 4 weeks/CBC/IV Feraheme.  Thank you Dr. Ether Griffins for allowing me to participate in the care of your pleasant patient. Please do not hesitate to contact me with questions or concerns in the interim.  # 30 minutes face-to-face with the patient discussing the above plan of care; more than 50% of time spent on counseling  and coordination.     Cammie Sickle, MD 05/03/2015 10:59 AM

## 2015-05-05 ENCOUNTER — Inpatient Hospital Stay: Payer: Medicare Other

## 2015-05-05 VITALS — BP 95/58 | HR 82 | Resp 20

## 2015-05-05 DIAGNOSIS — D500 Iron deficiency anemia secondary to blood loss (chronic): Secondary | ICD-10-CM

## 2015-05-05 MED ORDER — SODIUM CHLORIDE 0.9 % IV SOLN
Freq: Once | INTRAVENOUS | Status: AC
  Administered 2015-05-05: 10:00:00 via INTRAVENOUS
  Filled 2015-05-05: qty 1000

## 2015-05-05 MED ORDER — SODIUM CHLORIDE 0.9 % IV SOLN
510.0000 mg | Freq: Once | INTRAVENOUS | Status: AC
  Administered 2015-05-05: 510 mg via INTRAVENOUS
  Filled 2015-05-05: qty 17

## 2015-05-12 ENCOUNTER — Inpatient Hospital Stay: Payer: Medicare Other

## 2015-05-12 VITALS — BP 109/67 | HR 62 | Temp 97.8°F | Resp 20

## 2015-05-12 DIAGNOSIS — D500 Iron deficiency anemia secondary to blood loss (chronic): Secondary | ICD-10-CM

## 2015-05-12 MED ORDER — SODIUM CHLORIDE 0.9 % IV SOLN
510.0000 mg | Freq: Once | INTRAVENOUS | Status: AC
  Administered 2015-05-12: 510 mg via INTRAVENOUS
  Filled 2015-05-12: qty 17

## 2015-05-12 MED ORDER — SODIUM CHLORIDE 0.9 % IV SOLN
Freq: Once | INTRAVENOUS | Status: AC
  Administered 2015-05-12: 10:00:00 via INTRAVENOUS
  Filled 2015-05-12: qty 1000

## 2015-05-25 ENCOUNTER — Telehealth: Payer: Self-pay | Admitting: *Deleted

## 2015-05-25 NOTE — Telephone Encounter (Signed)
Pt also stated that she did not want me to mail her an updated packet with the new appt date and time. She stated that she would write it in her calender...td

## 2015-05-25 NOTE — Telephone Encounter (Signed)
i returned the pt call, and she decided to move her appt to 06/23/15 @ 10:30 am instead of June 2., 2017. i have routed this to [place] as well....td

## 2015-06-01 ENCOUNTER — Other Ambulatory Visit: Payer: Self-pay | Admitting: *Deleted

## 2015-06-01 DIAGNOSIS — D509 Iron deficiency anemia, unspecified: Secondary | ICD-10-CM

## 2015-06-02 ENCOUNTER — Inpatient Hospital Stay: Payer: Medicare Other

## 2015-06-02 ENCOUNTER — Inpatient Hospital Stay: Payer: Medicare Other | Attending: Internal Medicine

## 2015-06-02 ENCOUNTER — Inpatient Hospital Stay (HOSPITAL_BASED_OUTPATIENT_CLINIC_OR_DEPARTMENT_OTHER): Payer: Medicare Other | Admitting: Internal Medicine

## 2015-06-02 VITALS — BP 117/69 | HR 66 | Temp 98.6°F | Resp 18 | Wt 330.5 lb

## 2015-06-02 DIAGNOSIS — Z87891 Personal history of nicotine dependence: Secondary | ICD-10-CM | POA: Insufficient documentation

## 2015-06-02 DIAGNOSIS — R600 Localized edema: Secondary | ICD-10-CM | POA: Insufficient documentation

## 2015-06-02 DIAGNOSIS — D500 Iron deficiency anemia secondary to blood loss (chronic): Secondary | ICD-10-CM | POA: Insufficient documentation

## 2015-06-02 DIAGNOSIS — E039 Hypothyroidism, unspecified: Secondary | ICD-10-CM

## 2015-06-02 DIAGNOSIS — D509 Iron deficiency anemia, unspecified: Secondary | ICD-10-CM

## 2015-06-02 DIAGNOSIS — R233 Spontaneous ecchymoses: Secondary | ICD-10-CM

## 2015-06-02 DIAGNOSIS — Z7984 Long term (current) use of oral hypoglycemic drugs: Secondary | ICD-10-CM | POA: Insufficient documentation

## 2015-06-02 DIAGNOSIS — Z7901 Long term (current) use of anticoagulants: Secondary | ICD-10-CM

## 2015-06-02 DIAGNOSIS — Z79899 Other long term (current) drug therapy: Secondary | ICD-10-CM | POA: Diagnosis not present

## 2015-06-02 DIAGNOSIS — Z954 Presence of other heart-valve replacement: Secondary | ICD-10-CM | POA: Diagnosis not present

## 2015-06-02 DIAGNOSIS — I5032 Chronic diastolic (congestive) heart failure: Secondary | ICD-10-CM

## 2015-06-02 DIAGNOSIS — J449 Chronic obstructive pulmonary disease, unspecified: Secondary | ICD-10-CM

## 2015-06-02 DIAGNOSIS — E119 Type 2 diabetes mellitus without complications: Secondary | ICD-10-CM

## 2015-06-02 DIAGNOSIS — G4733 Obstructive sleep apnea (adult) (pediatric): Secondary | ICD-10-CM | POA: Diagnosis not present

## 2015-06-02 DIAGNOSIS — R5383 Other fatigue: Secondary | ICD-10-CM | POA: Insufficient documentation

## 2015-06-02 LAB — CBC WITH DIFFERENTIAL/PLATELET
BASOS ABS: 0 10*3/uL (ref 0–0.1)
BASOS PCT: 1 %
EOS ABS: 0.2 10*3/uL (ref 0–0.7)
EOS PCT: 4 %
HCT: 37.8 % (ref 35.0–47.0)
Hemoglobin: 12.5 g/dL (ref 12.0–16.0)
Lymphocytes Relative: 41 %
Lymphs Abs: 2.1 10*3/uL (ref 1.0–3.6)
MCH: 27.2 pg (ref 26.0–34.0)
MCHC: 33 g/dL (ref 32.0–36.0)
MCV: 82.4 fL (ref 80.0–100.0)
MONOS PCT: 7 %
Monocytes Absolute: 0.3 10*3/uL (ref 0.2–0.9)
Neutro Abs: 2.5 10*3/uL (ref 1.4–6.5)
Neutrophils Relative %: 47 %
Platelets: 110 10*3/uL — ABNORMAL LOW (ref 150–440)
RBC: 4.59 MIL/uL (ref 3.80–5.20)
RDW: 24.5 % — ABNORMAL HIGH (ref 11.5–14.5)
WBC: 5.2 10*3/uL (ref 3.6–11.0)

## 2015-06-02 MED ORDER — SODIUM CHLORIDE 0.9 % IV SOLN
510.0000 mg | Freq: Once | INTRAVENOUS | Status: AC
  Administered 2015-06-02: 510 mg via INTRAVENOUS
  Filled 2015-06-02: qty 17

## 2015-06-02 MED ORDER — SODIUM CHLORIDE 0.9 % IV SOLN
Freq: Once | INTRAVENOUS | Status: AC
  Administered 2015-06-02: 11:00:00 via INTRAVENOUS
  Filled 2015-06-02: qty 1000

## 2015-06-02 NOTE — Progress Notes (Signed)
[place] NOTE  Patient Care Team: Ellamae Sia, MD as PCP - General (Internal Medicine) Robert Bellow, MD (General Surgery)  CHIEF COMPLAINTS/PURPOSE OF CONSULTATION:   # April 2017- IRON DEFICIENCY ANEMIA sec to chronic GI blood loss; [Hb-7/Feb 2017] Recom IV ferrahem  # Chronic GI blood loss- EDG/capsule [feb 2017]- gastrtitis ? NSAIDs/COLO-July 2016/ [Dr.Elliot]  # Bovine heart valve [not on anti-coagulation]  HISTORY OF PRESENTING ILLNESS:  Theresa Leon 60 y.o.  female morbidly obese multiple medical problems history of iron deficiency anemia likely GI blood loss/unclear source is here for follow-up.  Patient received IV iron approximately month ago. Patient continues to have fatigue. She has noted some improvement in her fatigue but not completely resolved.  She continues to deny any blood in stools black stools. She has poor tolerance to by mouth iron. Otherwise no nausea no vomiting. No abdominal pain or constipation. She complains of a ecchymosis of the right forearm; she is not sure how this happened.  ROS: A complete 10 point review of system is done which is negative except mentioned above in history of present illness  MEDICAL HISTORY:  Past Medical History  Diagnosis Date  . Asthma 1975  . Diabetes mellitus without complication ([place]) 1610  . Heart disease   . 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 COPD (chronic obstructive pulmonary disease) ([place])   . OSA (obstructive sleep apnea)   . Diastolic CHF, acute on chronic (H[facility])   . Acute on chronic renal failure ([place])   . Anemia   . Diabetes ([place])   . H/O aortic valve replacement   . Morbid obesity ([place])   . Hypothyroid     SURGICAL HISTORY: Past Surgical History  Procedure Laterality Date  . Back surgery      AS CHILD  . Phrenic nerve pacemaker implantation  2009  . Aortic valve relaced   2008  . Cholecystectomy  2007  . Breast surgery   September 25, 2011    intraductal papilloma of the left breast  . Tracheostomy tube placement N/A 02/04/2014    Procedure: TRACHEOSTOMY;  Surgeon: Rozetta Nunnery, MD;  Location: [facility];  Service: ENT;  Laterality: N/A;  . Colonoscopy with propofol N/A 08/04/2014    Procedure: COLONOSCOPY WITH PROPOFOL;  Surgeon: Manya Silvas, MD;  [facility]COPY;  Service: Endoscopy;  Laterality: N/A;  . Esophagogastroduodenoscopy (egd) with propofol  08/04/2014    Procedure: ESOPHAGOGASTRODUODENOSCOPY (EGD) WITH PROPOFOL;  Surgeon: Manya Silvas, MD;  Location: [facility] ENDOSCOPY;  Service: Endoscopy;;  . Esophagogastroduodenoscopy Left 03/03/2015    Procedure: ESOPHAGOGASTRODUODENOSCOPY (EGD);  Surgeon: Manya Silvas, MD;  Location[facility] END[facility]COPY;  Service: Endoscopy;  Laterality: Left;    SOCIAL HISTORY: Social History   Social History  . Marital Status: Widowed    Spouse Name: N/A  . Number of Children: N/A  . Years of Education: N/A   Occupational History  . Not on file.   Social History Main Topics  . Smoking status: Former Research scientist (life sciences)  . Smokeless tobacco: Not on file  . Alcohol Use: No  . Drug Use: No  . Sexual Activity: Not on file   Other Topics Concern  . Not on file   Social History Narrative    FAMILY HISTORY: Family History  Problem Relation Age of Onset  . Skin telangiectasia Mother   . Lung cancer Maternal Grandmother   . Lung cancer Paternal  Grandmother   . Breast cancer Maternal Grandmother     ALLERGIES:  is allergic to furosemide; mold extract; tetanus toxoids; qvar; toprol xl; hctz; nitroglycerin; and other.  MEDICATIONS:  Current Outpatient Prescriptions  Medication Sig Dispense Refill  . acetaminophen (TYLENOL) 500 MG tablet Take 1,000 mg by mouth every 6 (six) hours as needed for mild pain or headache.    . albuterol (PROVENTIL HFA;VENTOLIN HFA) 108 (90 BASE) MCG/ACT inhaler Inhale 2 puffs into the lungs every 6 (six) hours as needed for wheezing or  shortness of breath.     Marland Kitchen atorvastatin (LIPITOR) 10 MG tablet Take 10 mg by mouth at bedtime.    . Calcium Carbonate-Vitamin D (CALCIUM 600+D) 600-400 MG-UNIT tablet Take 1 tablet by mouth at bedtime.    . cholecalciferol (VITAMIN D) 1000 units tablet Take 1,000 Units by mouth daily.    . citalopram (CELEXA) 40 MG tablet Take 40 mg by mouth daily.    Marland Kitchen docusate sodium (COLACE) 100 MG capsule Take 100 mg by mouth 2 (two) times daily as needed for mild constipation.    Marland Kitchen EPINEPHrine (EPIPEN 2-PAK) 0.3 mg/0.3 mL IJ SOAJ injection Inject 0.3 mg into the muscle once as needed (for severe allergic reaction).    . fluticasone (FLONASE) 50 MCG/ACT nasal spray Place 2 sprays into both nostrils daily as needed for rhinitis.     Marland Kitchen gabapentin (NEURONTIN) 600 MG tablet Take 600 mg by mouth 4 (four) times daily.     Marland Kitchen levothyroxine (SYNTHROID, LEVOTHROID) 100 MCG tablet Take 100 mcg by mouth daily before breakfast.     . loratadine (CLARITIN) 10 MG tablet Take 10 mg by mouth daily.    . metFORMIN (GLUCOPHAGE-XR) 500 MG 24 hr tablet Take 500 mg by mouth daily with breakfast.    . montelukast (SINGULAIR) 10 MG tablet Take 10 mg by mouth at bedtime.    Marland Kitchen oxyCODONE (OXY IR/ROXICODONE) 5 MG immediate release tablet Take 5 mg by mouth 3 (three) times daily as needed for severe pain.    . pantoprazole (PROTONIX) 40 MG tablet Take 1 tablet (40 mg total) by mouth 2 (two) times daily. Switch for any other PPI at similar dose and frequency 60 tablet 6  . sucralfate (CARAFATE) 1 g tablet Take 1 tablet (1 g total) by mouth 4 (four) times daily -  with meals and at bedtime. 90 tablet 6  . traZODone (DESYREL) 50 MG tablet Take 50 mg by mouth at bedtime.      No current facility-administered medications for this visit.      Marland Kitchen  PHYSICAL EXAMINATION:   Filed Vitals:   06/02/15 1003  BP: 117/69  Pulse: 66  Temp: 98.6 F (37 C)  Resp: 18   Filed Weights   06/02/15 1003  Weight: 330 lb 8 oz (149.914 kg)     GENERAL: Well-nourished well-developed; Alert, no distress and comfortable. Obese in a wheel chair.   EYES: positive for pallor OROPHARYNX: no thrush or ulceration. NECK: supple, no masses felt LYMPH:  no palpable lymphadenopathy in the cervical, axillary or inguinal regions LUNGS: clear to auscultation and  No wheeze or crackles HEART/CVS: regular rate & rhythm and no murmurs; 1 plus bil lower extremity edema ABDOMEN: abdomen soft, non-tender and normal bowel sounds Musculoskeletal:no cyanosis of digits and no clubbing  PSYCH: alert & oriented x 3 with fluent speech NEURO: no focal motor/sensory deficits SKIN:  no rashes or significant lesions; ecchymosis noted on the right forearm.  LABORATORY DATA:  I have reviewed the data as listed Lab Results  Component Value Date   WBC 5.2 06/02/2015   HGB 12.5 06/02/2015   HCT 37.8 06/02/2015   MCV 82.4 06/02/2015   PLT 110* 06/02/2015    Recent Labs  03/02/15 0528 04/22/15 1847 05/03/15 1126  NA 142 137 135  K 4.3 3.8 4.0  CL 108 100* 100*  CO2 31 32 32  GLUCOSE 114* 145* 200*  BUN 16 9 14   CREATININE 0.93 1.12* 1.11*  CALCIUM 8.6* 8.8* 8.8*  GFRNONAA >60 53* 54*  GFRAA >60 >60 >60  PROT  --  8.0 7.4  ALBUMIN  --  3.6 3.4*  AST  --  21 19  ALT  --  11* 11*  ALKPHOS  --  84 73  BILITOT  --  1.9* 1.3*     ASSESSMENT & PLAN:   #  IRON DEFICIENCY ANEMIA- Likely GI etiology [status post GI workup as above; no clear source]. Patient responded very well to IV iron today hemoglobin is 12. However she continues to compare to fatigue. I would recommend IV Feraheme again today.  # Recheck CBC ferritin and iron studies in 3 months/possible ferriheme.  # Follow-up in 3 months.  # Right forearm ecchymosis recommend ice pack.     Cammie Sickle, MD 06/02/2015 10:48 AM

## 2015-06-15 DIAGNOSIS — R0789 Other chest pain: Secondary | ICD-10-CM | POA: Diagnosis not present

## 2015-06-15 DIAGNOSIS — Z7951 Long term (current) use of inhaled steroids: Secondary | ICD-10-CM | POA: Insufficient documentation

## 2015-06-15 DIAGNOSIS — I5033 Acute on chronic diastolic (congestive) heart failure: Secondary | ICD-10-CM | POA: Insufficient documentation

## 2015-06-15 DIAGNOSIS — Z79899 Other long term (current) drug therapy: Secondary | ICD-10-CM | POA: Diagnosis not present

## 2015-06-15 DIAGNOSIS — J45909 Unspecified asthma, uncomplicated: Secondary | ICD-10-CM | POA: Diagnosis not present

## 2015-06-15 DIAGNOSIS — E119 Type 2 diabetes mellitus without complications: Secondary | ICD-10-CM | POA: Diagnosis not present

## 2015-06-15 DIAGNOSIS — R600 Localized edema: Secondary | ICD-10-CM | POA: Diagnosis not present

## 2015-06-15 DIAGNOSIS — E039 Hypothyroidism, unspecified: Secondary | ICD-10-CM | POA: Insufficient documentation

## 2015-06-15 DIAGNOSIS — J449 Chronic obstructive pulmonary disease, unspecified: Secondary | ICD-10-CM | POA: Insufficient documentation

## 2015-06-15 DIAGNOSIS — Z7984 Long term (current) use of oral hypoglycemic drugs: Secondary | ICD-10-CM | POA: Diagnosis not present

## 2015-06-15 DIAGNOSIS — Z87891 Personal history of nicotine dependence: Secondary | ICD-10-CM | POA: Diagnosis not present

## 2015-06-15 DIAGNOSIS — E785 Hyperlipidemia, unspecified: Secondary | ICD-10-CM | POA: Insufficient documentation

## 2015-06-15 LAB — COMPREHENSIVE METABOLIC PANEL
ALBUMIN: 3.7 g/dL (ref 3.5–5.0)
ALT: 17 U/L (ref 14–54)
ANION GAP: 5 (ref 5–15)
AST: 21 U/L (ref 15–41)
Alkaline Phosphatase: 71 U/L (ref 38–126)
BILIRUBIN TOTAL: 1.4 mg/dL — AB (ref 0.3–1.2)
BUN: 15 mg/dL (ref 6–20)
CALCIUM: 9 mg/dL (ref 8.9–10.3)
CHLORIDE: 103 mmol/L (ref 101–111)
CO2: 31 mmol/L (ref 22–32)
Creatinine, Ser: 1.41 mg/dL — ABNORMAL HIGH (ref 0.44–1.00)
GFR calc Af Amer: 47 mL/min — ABNORMAL LOW (ref >60)
GFR calc non Af Amer: 40 mL/min — ABNORMAL LOW (ref >60)
GLUCOSE: 175 mg/dL — AB (ref 65–99)
POTASSIUM: 3.7 mmol/L (ref 3.5–5.1)
SODIUM: 139 mmol/L (ref 135–145)
TOTAL PROTEIN: 7.2 g/dL (ref 6.5–8.1)

## 2015-06-15 LAB — TROPONIN I: Troponin I: <0.03 ng/mL (ref <0.031)

## 2015-06-15 NOTE — ED Notes (Signed)
Patient with chest pain x 2 weeks. Has had nuclear scan done last week which did not yield any results because "it was cloudy and they couldn't see anything real good." Then had MRI because there was a "shadowy part of my heart and they really didn't know what that was." Patient was waiting in the [place] with her sister who was to be seen for a dog bite when she stated she also wanted to be seen while she was here because of the chest pain. Patient is not short of breath, answers questions appropriately, and states the CP is "all over my left breast, up into my arm pit and states "I need a blanket now because I'm freezing."

## 2015-06-16 ENCOUNTER — Emergency Department
Admission: EM | Admit: 2015-06-16 | Discharge: 2015-06-16 | Disposition: A | Discharge status: Home / Self Care | Payer: Medicare Other | Attending: Emergency Medicine | Admitting: Emergency Medicine

## 2015-06-16 ENCOUNTER — Telehealth: Payer: Self-pay | Admitting: *Deleted

## 2015-06-16 ENCOUNTER — Emergency Department: Payer: Medicare Other

## 2015-06-16 DIAGNOSIS — R609 Edema, unspecified: Secondary | ICD-10-CM

## 2015-06-16 DIAGNOSIS — R079 Chest pain, unspecified: Secondary | ICD-10-CM

## 2015-06-16 LAB — CBC
HCT: 39.9 % (ref 35.0–47.0)
Hemoglobin: 13.1 g/dL (ref 12.0–16.0)
MCH: 27.6 pg (ref 26.0–34.0)
MCHC: 32.8 g/dL (ref 32.0–36.0)
MCV: 84 fL (ref 80.0–100.0)
PLATELETS: 95 10*3/uL — AB (ref 150–440)
RBC: 4.75 MIL/uL (ref 3.80–5.20)
RDW: 22.6 % — AB (ref 11.5–14.5)
WBC: 7.8 10*3/uL (ref 3.6–11.0)

## 2015-06-16 LAB — TROPONIN I: Troponin I: <0.03 ng/mL (ref <0.031)

## 2015-06-16 LAB — BRAIN NATRIURETIC PEPTIDE: B Natriuretic Peptide: 217 pg/mL — ABNORMAL HIGH (ref 0.0–100.0)

## 2015-06-16 LAB — LIPASE, BLOOD: LIPASE: 24 U/L (ref 11–51)

## 2015-06-16 NOTE — ED Notes (Signed)
Pt taken to xray 

## 2015-06-16 NOTE — Telephone Encounter (Signed)
Pt is aware that her appt for 06/23/15 is now cancelled due to Dr. Idelia Salm has placed his new pts on hold. Pt is also aware that she will go on the wait list..Marland KitchenTD

## 2015-06-16 NOTE — Discharge Instructions (Signed)
1. Take your fluid pill once a day for the next 3 days. 2. Elevate legs whenever possible. 3. Return to the ER for worsening symptoms, persistent vomiting, difficulty breathing or other concerns.  Nonspecific Chest Pain  Chest pain can be caused by many different conditions. There is always a chance that your pain could be related to something serious, such as a heart attack or a blood clot in your lungs. Chest pain can also be caused by conditions that are not life-threatening. If you have chest pain, it is very important to follow up with your health care provider. CAUSES  Chest pain can be caused by:  Heartburn.  Pneumonia or bronchitis.  Anxiety or stress.  Inflammation around your heart (pericarditis) or lung (pleuritis or pleurisy).  A blood clot in your lung.  A collapsed lung (pneumothorax). It can develop suddenly on its own (spontaneous pneumothorax) or from trauma to the chest.  Shingles infection (varicella-zoster virus).  Heart attack.  Damage to the bones, muscles, and cartilage that make up your chest wall. This can include:  Bruised bones due to injury.  Strained muscles or cartilage due to frequent or repeated coughing or overwork.  Fracture to one or more ribs.  Sore cartilage due to inflammation (costochondritis). RISK FACTORS  Risk factors for chest pain may include:  Activities that increase your risk for trauma or injury to your chest.  Respiratory infections or conditions that cause frequent coughing.  Medical conditions or overeating that can cause heartburn.  Heart disease or family history of heart disease.  Conditions or health behaviors that increase your risk of developing a blood clot.  Having had chicken pox (varicella zoster). SIGNS AND SYMPTOMS Chest pain can feel like:  Burning or tingling on the surface of your chest or deep in your chest.  Crushing, pressure, aching, or squeezing pain.  Dull or sharp pain that is worse when  you move, cough, or take a deep breath.  Pain that is also felt in your back, neck, shoulder, or arm, or pain that spreads to any of these areas. Your chest pain may come and go, or it may stay constant. DIAGNOSIS Lab tests or other studies may be needed to find the cause of your pain. Your health care provider may have you take a test called an ambulatory ECG (electrocardiogram). An ECG records your heartbeat patterns at the time the test is performed. You may also have other tests, such as:  Transthoracic echocardiogram (TTE). During echocardiography, sound waves are used to create a picture of all of the heart structures and to look at how blood flows through your heart.  Transesophageal echocardiogram (TEE).This is a more advanced imaging test that obtains images from inside your body. It allows your health care provider to see your heart in finer detail.  Cardiac monitoring. This allows your health care provider to monitor your heart rate and rhythm in real time.  Holter monitor. This is a portable device that records your heartbeat and can help to diagnose abnormal heartbeats. It allows your health care provider to track your heart activity for several days, if needed.  Stress tests. These can be done through exercise or by taking medicine that makes your heart beat more quickly.  Blood tests.  Imaging tests. TREATMENT  Your treatment depends on what is causing your chest pain. Treatment may include:  Medicines. These may include:  Acid blockers for heartburn.  Anti-inflammatory medicine.  Pain medicine for inflammatory conditions.  Antibiotic medicine, if an  infection is present.  Medicines to dissolve blood clots.  Medicines to treat coronary artery disease.  Supportive care for conditions that do not require medicines. This may include:  Resting.  Applying heat or cold packs to injured areas.  Limiting activities until pain decreases. HOME CARE INSTRUCTIONS  If  you were prescribed an antibiotic medicine, finish it all even if you start to feel better.  Avoid any activities that bring on chest pain.  Do not use any tobacco products, including cigarettes, chewing tobacco, or electronic cigarettes. If you need help quitting, ask your health care provider.  Do not drink alcohol.  Take medicines only as directed by your health care provider.  Keep all follow-up visits as directed by your health care provider. This is important. This includes any further testing if your chest pain does not go away.  If heartburn is the cause for your chest pain, you may be told to keep your head raised (elevated) while sleeping. This reduces the chance that acid will go from your stomach into your esophagus.  Make lifestyle changes as directed by your health care provider. These may include:  Getting regular exercise. Ask your health care provider to suggest some activities that are safe for you.  Eating a heart-healthy diet. A registered dietitian can help you to learn healthy eating options.  Maintaining a healthy weight.  Managing diabetes, if necessary.  Reducing stress. SEEK MEDICAL CARE IF:  Your chest pain does not go away after treatment.  You have a rash with blisters on your chest.  You have a fever. SEEK IMMEDIATE MEDICAL CARE IF:   Your chest pain is worse.  You have an increasing cough, or you cough up blood.  You have severe abdominal pain.  You have severe weakness.  You faint.  You have chills.  You have sudden, unexplained chest discomfort.  You have sudden, unexplained discomfort in your arms, back, neck, or jaw.  You have shortness of breath at any time.  You suddenly start to sweat, or your skin gets clammy.  You feel nauseous or you vomit.  You suddenly feel light-headed or dizzy.  Your heart begins to beat quickly, or it feels like it is skipping beats. These symptoms may represent a serious problem that is an  emergency. Do not wait to see if the symptoms will go away. Get medical help right away. Call your local emergency services (911 in the [place] Do not drive yourself to the hospital.   This information is not intended to replace advice given to you by your health care provider. Make sure you discuss any questions you have with your health care provider.   Document Released: 10/24/2004 Document Revised: 02/04/2014 Document Reviewed: 08/20/2013 Elsevier Interactive Patient Education 2016 Elsevier Inc.  Edema Edema is an abnormal buildup of fluids in your bodytissues. Edema is somewhatdependent on gravity to pull the fluid to the lowest place in your body. That makes the condition more common in the legs and thighs (lower extremities). Painless swelling of the feet and ankles is common and becomes more likely as you get older. It is also common in looser tissues, like around your eyes.  When the affected area is squeezed, the fluid may move out of that spot and leave a dent for a few moments. This dent is called pitting.  CAUSES  There are many possible causes of edema. Eating too much salt and being on your feet or sitting for a long time can cause edema in  your legs and ankles. Hot weather may make edema worse. Common medical causes of edema include:  Heart failure.  Liver disease.  Kidney disease.  Weak blood vessels in your legs.  Cancer.  An injury.  Pregnancy.  Some medications.  Obesity. SYMPTOMS  Edema is usually painless.Your skin may look swollen or shiny.  DIAGNOSIS  Your health care provider may be able to diagnose edema by asking about your medical history and doing a physical exam. You may need to have tests such as X-rays, an electrocardiogram, or blood tests to check for medical conditions that may cause edema.  TREATMENT  Edema treatment depends on the cause. If you have heart, liver, or kidney disease, you need the treatment appropriate for these conditions.  General treatment may include:  Elevation of the affected body part above the level of your heart.  Compression of the affected body part. Pressure from elastic bandages or support stockings squeezes the tissues and forces fluid back into the blood vessels. This keeps fluid from entering the tissues.  Restriction of fluid and salt intake.  Use of a water pill (diuretic). These medications are appropriate only for some types of edema. They pull fluid out of your body and make you urinate more often. This gets rid of fluid and reduces swelling, but diuretics can have side effects. Only use diuretics as directed by your health care provider. HOME CARE INSTRUCTIONS   Keep the affected body part above the level of your heart when you are lying down.   Do not sit still or stand for prolonged periods.   Do not put anything directly under your knees when lying down.  Do not wear constricting clothing or garters on your upper legs.   Exercise your legs to work the fluid back into your blood vessels. This may help the swelling go down.   Wear elastic bandages or support stockings to reduce ankle swelling as directed by your health care provider.   Eat a low-salt diet to reduce fluid if your health care provider recommends it.   Only take medicines as directed by your health care provider. SEEK MEDICAL CARE IF:   Your edema is not responding to treatment.  You have heart, liver, or kidney disease and notice symptoms of edema.  You have edema in your legs that does not improve after elevating them.   You have sudden and unexplained weight gain. SEEK IMMEDIATE MEDICAL CARE IF:   You develop shortness of breath or chest pain.   You cannot breathe when you lie down.  You develop pain, redness, or warmth in the swollen areas.   You have heart, liver, or kidney disease and suddenly get edema.  You have a fever and your symptoms suddenly get worse. MAKE SURE YOU:   Understand  these instructions.  Will watch your condition.  Will get help right away if you are not doing well or get worse.   This information is not intended to replace advice given to you by your health care provider. Make sure you discuss any questions you have with your health care provider.   Document Released: 01/14/2005 Document Revised: 02/04/2014 Document Reviewed: 11/06/2012 Elsevier Interactive Patient Education [employer].

## 2015-06-16 NOTE — ED Provider Notes (Signed)
[facility] Emergency Department Provider Note   ____________________________________________  Time seen: Approximately 2:57 AM  I have reviewed the triage vital signs and the nursing notes.   HISTORY  Chief Complaint Chest Pain    HPI Theresa Leon is a 60 y.o. female who presents to the ED from the lobby with a chief complaint of chest pain. Patient was waiting in the lobby with her sister who was a patient when she decided to also be evaluated for her chest pain. Patient reports chest pain 2 weeks. She has had a cardiology workup by Dr. Yancey Flemings which included normal Myoview stress test last week, echocardiogram and most recently cardiac MRI this week (patient does not yet know these results). She was sitting in the waiting room with her sister approximately 11 PM when she developed substernal chest pressure associated with nausea only. Denies associated diaphoresis, shortness of breath, vomiting, palpitations, dizziness. This is similar pain to what patient has been experiencing for the past 2 weeks. Denies recent fever, chills, cough, congestion, abdominal pain, diarrhea. Denies recent travel or trauma. Nothing makes her pain better or worse.   Past Medical History  Diagnosis Date  . Asthma 1975  . Diabetes mellitus without complication ([place]) 2671  . Heart disease   . 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 COPD (chronic obstructive pulmonary disease) ([place])   . OSA (obstructive sleep apnea)   . Diastolic CHF, acute on chronic (H[facility])   . Acute on chronic renal failure ([place])   . Anemia   . Diabetes ([place])   . H/O aortic valve replacement   . Morbid obesity ([place])   . Hypothyroid     Patient Active Problem List   Diagnosis Date Noted  . Iron deficiency anemia due to chronic blood loss 05/03/2015  . Gastritis due to nonsteroidal anti-inflammatory drug (NSAID) 03/05/2015  . History of esophagogastroduodenoscopy (EGD)  03/05/2015  . Type 2 diabetes mellitus ([place]) 03/01/2015  . GERD (gastroesophageal reflux disease) 03/01/2015  . HLD (hyperlipidemia) 03/01/2015  . COPD (chronic obstructive pulmonary disease) ([place]) 03/01/2015  . Chronic diastolic CHF (congestive heart failure) ([place]) 03/01/2015  . Hypothyroidism 03/01/2015  . OSA on CPAP 03/01/2015  . Acute posthemorrhagic anemia 03/01/2015  . Dysphagia   . CHF (congestive heart failure) ([place])   . Acute respiratory failure with hypoxia ([place]) 01/26/2014  . Encounter for imaging study to confirm orogastric (OG) tube placement   . Respiratory failure ([place])   . 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 Back surgery      AS CHILD  . Phrenic nerve pacemaker implantation  2009  . Aortic valve relaced   2008  . Cholecystectomy  2007  . Breast surgery  September 25, 2011    intraductal papilloma of the left breast  . Tracheostomy tube placement N/A 02/04/2014    Procedure: TRACHEOSTOMY;  Surgeon: Rozetta Nunnery, MD;  Location: [facility];  Service: ENT;  Laterality: N/A;  . Colonoscopy with propofol N/A 08/04/2014    Procedure: COLONOSCOPY WITH PROPOFOL;  Surgeon: Manya Silvas, MD;  Location: [facility] ENDOSCOPY;  Service: Endoscopy;  Laterality: N/A;  . Esophagogastroduodenoscopy (egd) with propofol  08/04/2014    Procedure: ESOPHAGOGASTRODUODENOSCOPY (EGD) WITH PROPOFOL;  Surgeon: Manya Silvas, MD;  Location: [facility] ENDOSCOPY;  Service: Endoscopy;;  . Esophagogastroduodenoscopy Left 03/03/2015    Procedure: ESOPHAGOGASTRODUODENOSCOPY (EGD);  Surgeon: Herbie Baltimore  Federico Flake, MD;  Location: [facility] ENDOSCOPY;  Service: Endoscopy;  Laterality: Left;    Current Outpatient Rx  Name  Route  Sig  Dispense  Refill  . atorvastatin (LIPITOR) 10 MG tablet   Oral   Take 10 mg by mouth at bedtime.         . Calcium Carbonate-Vitamin D (CALCIUM 600+D) 600-400 MG-UNIT tablet   Oral   Take 1 tablet by mouth at  bedtime.         . cholecalciferol (VITAMIN D) 1000 units tablet   Oral   Take 1,000 Units by mouth daily.         . citalopram (CELEXA) 40 MG tablet   Oral   Take 40 mg by mouth daily.         . fluticasone (FLONASE) 50 MCG/ACT nasal spray   Each Nare   Place 2 sprays into both nostrils daily as needed for rhinitis.          Marland Kitchen gabapentin (NEURONTIN) 600 MG tablet   Oral   Take 600 mg by mouth 4 (four) times daily.          Marland Kitchen levothyroxine (SYNTHROID, LEVOTHROID) 100 MCG tablet   Oral   Take 100 mcg by mouth daily before breakfast.          . loratadine (CLARITIN) 10 MG tablet   Oral   Take 10 mg by mouth daily.         . metFORMIN (GLUCOPHAGE-XR) 500 MG 24 hr tablet   Oral   Take 500 mg by mouth daily with breakfast.         . montelukast (SINGULAIR) 10 MG tablet   Oral   Take 10 mg by mouth at bedtime.         Marland Kitchen oxyCODONE (OXY IR/ROXICODONE) 5 MG immediate release tablet   Oral   Take 5 mg by mouth 3 (three) times daily as needed for severe pain.         . pantoprazole (PROTONIX) 40 MG tablet   Oral   Take 1 tablet (40 mg total) by mouth 2 (two) times daily. Switch for any other PPI at similar dose and frequency   60 tablet   6   . sucralfate (CARAFATE) 1 g tablet   Oral   Take 1 tablet (1 g total) by mouth 4 (four) times daily -  with meals and at bedtime.   90 tablet   6   . traZODone (DESYREL) 50 MG tablet   Oral   Take 50 mg by mouth at bedtime.          Marland Kitchen acetaminophen (TYLENOL) 500 MG tablet   Oral   Take 1,000 mg by mouth every 6 (six) hours as needed for mild pain or headache.         . albuterol (PROVENTIL HFA;VENTOLIN HFA) 108 (90 BASE) MCG/ACT inhaler   Inhalation   Inhale 2 puffs into the lungs every 6 (six) hours as needed for wheezing or shortness of breath.          . EPINEPHrine (EPIPEN 2-PAK) 0.3 mg/0.3 mL IJ SOAJ injection   Intramuscular   Inject 0.3 mg into the muscle once as needed (for severe allergic  reaction).           Allergies Furosemide; Mold extract; Tetanus toxoids; Qvar; Toprol xl; Hctz; Nitroglycerin; and Other  Family History  Problem Relation Age of Onset  . Skin telangiectasia Mother   . Lung cancer  Maternal Grandmother   . Lung cancer Paternal Grandmother   . Breast cancer Maternal Grandmother     Social History Social History  Substance Use Topics  . Smoking status: Former Research scientist (life sciences)  . Smokeless tobacco: None  . Alcohol Use: No    Review of Systems  Constitutional: No fever/chills. Eyes: No visual changes. ENT: No sore throat. Cardiovascular: Positive for chest pain. Respiratory: Denies shortness of breath. Gastrointestinal: No abdominal pain.  No nausea, no vomiting.  No diarrhea.  No constipation. Genitourinary: Negative for dysuria. Musculoskeletal: Negative for back pain. Skin: Negative for rash. Neurological: Negative for headaches, focal weakness or numbness.  10-point ROS otherwise negative.  ____________________________________________   PHYSICAL EXAM:  VITAL SIGNS: ED Triage Vitals  Enc Vitals Group     BP 06/15/15 2324 120/56 mmHg     Pulse Rate 06/15/15 2324 72     Resp 06/15/15 2324 18     Temp 06/15/15 2324 98.7 F (37.1 C)     Temp Source 06/15/15 2324 Oral     SpO2 06/15/15 2324 95 %     Weight 06/15/15 2324 325 lb (147.419 kg)     Height 06/15/15 2324 5' 7"  (1.702 m)     Head Cir --      Peak Flow --      Pain Score 06/15/15 2327 7     Pain Loc --      Pain Edu? --      Excl. in [place]? --     Constitutional: Alert and oriented. Well appearing and in no acute distress. Eyes: Conjunctivae are normal. PERRL. EOMI. Head: Atraumatic. Nose: No congestion/rhinnorhea. Mouth/Throat: Mucous membranes are moist.  Oropharynx non-erythematous. Neck: No stridor.   Cardiovascular: Normal rate, regular rhythm. Grossly normal heart sounds.  Good peripheral circulation. Respiratory: Normal respiratory effort.  No retractions. Lungs  CTAB. Gastrointestinal: Soft and nontender. No distention. No abdominal bruits. No CVA tenderness. Musculoskeletal: No lower extremity tenderness. 2+ nonpitting BLE edema.  No joint effusions. Neurologic:  Normal speech and language. No gross focal neurologic deficits are appreciated.  Skin:  Skin is warm, dry and intact. No rash noted. Psychiatric: Mood and affect are normal. Speech and behavior are normal.  ____________________________________________   LABS (all labs ordered are listed, but only abnormal results are displayed)  Labs Reviewed  CBC - Abnormal; Notable for the following:    RDW 22.6 (*)    Platelets 95 (*)    All other components within normal limits  COMPREHENSIVE METABOLIC PANEL - Abnormal; Notable for the following:    Glucose, Bld 175 (*)    Creatinine, Ser 1.41 (*)    Total Bilirubin 1.4 (*)    GFR calc non Af Amer 40 (*)    GFR calc Af Amer 47 (*)    All other components within normal limits  BRAIN NATRIURETIC PEPTIDE - Abnormal; Notable for the following:    B Natriuretic Peptide 217.0 (*)    All other components within normal limits  TROPONIN I  LIPASE, BLOOD  TROPONIN I   ____________________________________________  EKG  ED ECG REPORT I, Gurtej Noyola J, the attending physician, personally viewed and interpreted this ECG.   Date: 06/16/2015  EKG Time: 2324  Rate: 77  Rhythm: normal EKG, normal sinus rhythm  Axis: LAD  Intervals:PVCs  ST&T Change: Nonspecific  ____________________________________________  RADIOLOGY  Chest 2 view (viewed by me, interpreted per Dr. Quintella Reichert): No active cardiopulmonary disease. ____________________________________________   PROCEDURES  Procedure(s) performed: None  Critical Care performed:  No  ____________________________________________   INITIAL IMPRESSION / ASSESSMENT AND PLAN / ED COURSE  Pertinent labs & imaging results that were available during my care of the patient were reviewed by me and  considered in my medical decision making (see chart for details).  60 year old female with a history of diabetes, aortic valve replacement, pacemaker who presents with a two-week history of chest pain. She has recently undergone negative cardiology workup by Dr. Humphrey Rolls. She has GI bleeding of unknown etiology and thus we will withhold aspirin at this time. She takes her "fluid pill" only twice per week. She has known about her mildly elevated bilirubin and "all her life"and does not have epigastric tenderness to palpation. Initial troponin negative. Will repeat troponin. Currently patient is resting in no acute distress, asking for sandwich tray.  ----------------------------------------- 5:02 AM on 06/16/2015 -----------------------------------------  Updated patient of repeat negative troponin. Advised her to take her diuretic daily for the next 3 days, and to speak with her cardiologist today. Strict return precautions given. Patient verbalizes understanding and agrees with plan of care. ____________________________________________   FINAL CLINICAL IMPRESSION(S) / ED DIAGNOSES  Final diagnoses:  Chest pain, unspecified chest pain type  Peripheral edema      NEW MEDICATIONS STARTED DURING THIS VISIT:  New Prescriptions   No medications on file     Note:  This document was prepared using Dragon voice recognition software and may include unintentional dictation errors.    Paulette Blanch, MD 06/16/15 [phone]

## 2015-06-17 ENCOUNTER — Emergency Department
Admission: EM | Admit: 2015-06-17 | Discharge: 2015-06-17 | Disposition: A | Discharge status: Home / Self Care | Payer: Medicare Other | Attending: Emergency Medicine | Admitting: Emergency Medicine

## 2015-06-17 ENCOUNTER — Encounter: Payer: Self-pay | Admitting: Emergency Medicine

## 2015-06-17 DIAGNOSIS — I130 Hypertensive heart and chronic kidney disease with heart failure and stage 1 through stage 4 chronic kidney disease, or unspecified chronic kidney disease: Secondary | ICD-10-CM | POA: Insufficient documentation

## 2015-06-17 DIAGNOSIS — J45909 Unspecified asthma, uncomplicated: Secondary | ICD-10-CM | POA: Diagnosis not present

## 2015-06-17 DIAGNOSIS — Z79899 Other long term (current) drug therapy: Secondary | ICD-10-CM | POA: Insufficient documentation

## 2015-06-17 DIAGNOSIS — J449 Chronic obstructive pulmonary disease, unspecified: Secondary | ICD-10-CM | POA: Diagnosis not present

## 2015-06-17 DIAGNOSIS — Z7951 Long term (current) use of inhaled steroids: Secondary | ICD-10-CM | POA: Insufficient documentation

## 2015-06-17 DIAGNOSIS — E785 Hyperlipidemia, unspecified: Secondary | ICD-10-CM | POA: Diagnosis not present

## 2015-06-17 DIAGNOSIS — E039 Hypothyroidism, unspecified: Secondary | ICD-10-CM | POA: Insufficient documentation

## 2015-06-17 DIAGNOSIS — Z79891 Long term (current) use of opiate analgesic: Secondary | ICD-10-CM | POA: Insufficient documentation

## 2015-06-17 DIAGNOSIS — R251 Tremor, unspecified: Secondary | ICD-10-CM | POA: Insufficient documentation

## 2015-06-17 DIAGNOSIS — E1122 Type 2 diabetes mellitus with diabetic chronic kidney disease: Secondary | ICD-10-CM | POA: Insufficient documentation

## 2015-06-17 DIAGNOSIS — Z87891 Personal history of nicotine dependence: Secondary | ICD-10-CM | POA: Insufficient documentation

## 2015-06-17 DIAGNOSIS — Z952 Presence of prosthetic heart valve: Secondary | ICD-10-CM | POA: Diagnosis not present

## 2015-06-17 DIAGNOSIS — I5032 Chronic diastolic (congestive) heart failure: Secondary | ICD-10-CM | POA: Diagnosis not present

## 2015-06-17 DIAGNOSIS — Z7984 Long term (current) use of oral hypoglycemic drugs: Secondary | ICD-10-CM | POA: Insufficient documentation

## 2015-06-17 DIAGNOSIS — N189 Chronic kidney disease, unspecified: Secondary | ICD-10-CM | POA: Insufficient documentation

## 2015-06-17 LAB — CBC
HEMATOCRIT: 37.9 % (ref 35.0–47.0)
HEMOGLOBIN: 12.4 g/dL (ref 12.0–16.0)
MCH: 28.3 pg (ref 26.0–34.0)
MCHC: 32.8 g/dL (ref 32.0–36.0)
MCV: 86.3 fL (ref 80.0–100.0)
Platelets: 85 10*3/uL — ABNORMAL LOW (ref 150–440)
RBC: 4.39 MIL/uL (ref 3.80–5.20)
RDW: 22.2 % — ABNORMAL HIGH (ref 11.5–14.5)
WBC: 6 10*3/uL (ref 3.6–11.0)

## 2015-06-17 LAB — BASIC METABOLIC PANEL
ANION GAP: 6 (ref 5–15)
BUN: 16 mg/dL (ref 6–20)
CHLORIDE: 104 mmol/L (ref 101–111)
CO2: 29 mmol/L (ref 22–32)
Calcium: 8.8 mg/dL — ABNORMAL LOW (ref 8.9–10.3)
Creatinine, Ser: 0.97 mg/dL (ref 0.44–1.00)
GFR calc Af Amer: >60 mL/min (ref >60)
GLUCOSE: 89 mg/dL (ref 65–99)
POTASSIUM: 4.2 mmol/L (ref 3.5–5.1)
Sodium: 139 mmol/L (ref 135–145)

## 2015-06-17 LAB — TROPONIN I

## 2015-06-17 NOTE — Discharge Instructions (Signed)
You have been seen in the Emergency Department (ED) today for tremors an brief burning feeling in the chest.  As we have discussed todays test results are normal, but you may require further testing.  Please follow up with the recommended doctor as instructed above in these documents regarding todays emergent visit and your recent symptoms to discuss further management.  Continue to take your regular medications. If you are not doing so already, please also take a daily baby aspirin (81 mg), at least until you follow up with your doctor.  Return to the Emergency Department (ED) if you experience any further chest pain/pressure/tightness, difficulty breathing, or sudden sweating, or other symptoms that concern you.   Tremor A tremor is trembling or shaking that you cannot control. Most tremors affect the hands or arms. Tremors can also affect the head, vocal cords, face, and other parts of the body. There are many types of tremors. Common types include:   Essential tremor. These usually occur in people over the age of 40. It may run in families and can happen in otherwise healthy people.   Resting tremor. These occur when the muscles are at rest, such as when your hands are resting in your lap. People with Parkinson disease often have resting tremors.   Postural tremor. These occur when you try to hold a pose, such as keeping your hands outstretched.   Kinetic tremor. These occur during purposeful movement, such as trying to touch a finger to your nose.   Task-specific tremor. These may occur when you perform tasks such as handwriting, speaking, or standing.   Psychogenic tremor. These dramatically lessen or disappear when you are distracted. They can happen in people of all ages.  Some types of tremors have no known cause. Tremors can also be a symptom of nervous system problems (neurological disorders) that may occur with aging. Some tremors go away with treatment while others do not.    HOME CARE INSTRUCTIONS Watch your tremor for any changes. The following actions may help to lessen any discomfort you are feeling:  Take medicines only as directed by your health care provider.   Limit alcohol intake to no more than 1 drink per day for nonpregnant women and 2 drinks per day for men. One drink equals 12 oz of beer, 5 oz of wine, or 1 oz of hard liquor.  Do not use any tobacco products, including cigarettes, chewing tobacco, or electronic cigarettes. If you need help quitting, ask your health care provider.   Avoid extreme heat or cold.   Limit the amount of caffeine you consumeas directed by your health care provider.   Try to get 8 hours of sleep each night.  Find ways to manage your stress, such as meditation or yoga.  Keep all follow-up visits as directed by your health care provider. This is important. SEEK MEDICAL CARE IF:  You start having a tremor after starting a new medicine.  You have tremor with other symptoms such as:  Numbness.  Tingling.  Pain.  Weakness.  Your tremor gets worse.  Your tremor interferes with your day-to-day life.   This information is not intended to replace advice given to you by your health care provider. Make sure you discuss any questions you have with your health care provider.   Document Released: 01/04/2002 Document Revised: 02/04/2014 Document Reviewed: 07/12/2013 Elsevier Interactive Patient Education [employer].

## 2015-06-17 NOTE — ED Notes (Signed)
At this time pt's jerking movements have stopped.

## 2015-06-17 NOTE — ED Provider Notes (Signed)
[facility] Emergency Department Provider Note  ____________________________________________  Time seen: Approximately 7:00 PM  I have reviewed the triage vital signs and the nursing notes.   HISTORY  Chief Complaint Tremors and Chest Pain    HPI MARILLYN GOREN is a 60 y.o. female who presents for evaluation of tremor.  Patient was checking out at [facility] when she started feeling uncontrollable shaking in both hands. She said she had jumping and jerking movements in both arms, and then she felt a brief "burning" in her chest that feels like "acid reflux".  The symptoms have now completely resolved. She reports she does not know why this occurred. She feels fine at the present time. She does tell me she had a recent heart test with Dr. Chancy Milroy, but has not received the clinic tests back yet.  She denies any shortness of breath, vomiting, fevers chills or sweats. She did feel briefly nauseated but this is also gone away.   Past Medical History  Diagnosis Date  . Asthma 1975  . Diabetes mellitus without complication ([place]) 8341  . Heart disease   . 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 COPD (chronic obstructive pulmonary disease) ([place])   . OSA (obstructive sleep apnea)   . Diastolic CHF, acute on chronic (HCC)   . Acute on chronic renal failure ([place])   . Anemia   . Diabetes ([place])   . H/O aortic valve replacement   . Morbid obesity ([place])   . Hypothyroid     Patient Active Problem List   Diagnosis Date Noted  . Iron deficiency anemia due to chronic blood loss 05/03/2015  . Gastritis due to nonsteroidal anti-inflammatory drug (NSAID) 03/05/2015  . History of esophagogastroduodenoscopy (EGD) 03/05/2015  . Type 2 diabetes mellitus ([place]) 03/01/2015  . GERD (gastroesophageal reflux disease) 03/01/2015  . HLD (hyperlipidemia) 03/01/2015  . COPD (chronic obstructive pulmonary disease) ([place]) 03/01/2015  . Chronic  diastolic CHF (congestive heart failure) ([place]) 03/01/2015  . Hypothyroidism 03/01/2015  . OSA on CPAP 03/01/2015  . Acute posthemorrhagic anemia 03/01/2015  . Dysphagia   . CHF (congestive heart failure) ([place])   . Acute respiratory failure with hypoxia ([place]) 01/26/2014  . Encounter for imaging study to confirm orogastric (OG) tube placement   . Respiratory failure ([place])   . 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 Back surgery      AS CHILD  . Phrenic nerve pacemaker implantation  2009  . Aortic valve relaced   2008  . Cholecystectomy  2007  . Breast surgery  September 25, 2011    intraductal papilloma of the left breast  . Tracheostomy tube placement N/A 02/04/2014    Procedure: TRACHEOSTOMY;  Surgeon: Rozetta Nunnery, MD;  Location: [place];  Service: ENT;  Laterality: N/A;  . Colonoscopy with propofol N/A 08/04/2014    Procedure: COLONOSCOPY WITH PROPOFOL;  Surgeon: Manya Silvas, MD;  Location[facility] ENDOSCOPY;  Service: Endoscopy;  Laterality: N/A;  . Esophagogastroduodenoscopy (egd) with propofol  08/04/2014    Procedure: ESOPHAGOGASTRODUODENOSCOPY (EGD) WITH PROPOFOL;  Surgeon: Manya Silvas, MD;  Location: [facility] ENDOSCOPY;  Service: Endoscopy;;  . Esophagogastroduodenoscopy Left 03/03/2015    Procedure: ESOPHAGOGASTRODUODENOSCOPY (EGD);  Surgeon: Manya Silvas, MD;  Location: [facility] ENDOSCOPY;  Service: Endoscopy;  Laterality: Left;    Current Outpatient Rx  Name  Route  Sig  Dispense  Refill  .  acetaminophen (TYLENOL) 500 MG tablet   Oral   Take 1,000 mg by mouth every 6 (six) hours as needed for mild pain or headache.         . albuterol (PROVENTIL HFA;VENTOLIN HFA) 108 (90 BASE) MCG/ACT inhaler   Inhalation   Inhale 2 puffs into the lungs every 6 (six) hours as needed for wheezing or shortness of breath.          Marland Kitchen atorvastatin (LIPITOR) 10 MG tablet   Oral   Take 10 mg by mouth at bedtime.          . Calcium Carbonate-Vitamin D (CALCIUM 600+D) 600-400 MG-UNIT tablet   Oral   Take 1 tablet by mouth at bedtime.         . cholecalciferol (VITAMIN D) 1000 units tablet   Oral   Take 1,000 Units by mouth daily.         . citalopram (CELEXA) 40 MG tablet   Oral   Take 40 mg by mouth daily.         Marland Kitchen EPINEPHrine (EPIPEN 2-PAK) 0.3 mg/0.3 mL IJ SOAJ injection   Intramuscular   Inject 0.3 mg into the muscle once as needed (for severe allergic reaction).         . fluticasone (FLONASE) 50 MCG/ACT nasal spray   Each Nare   Place 2 sprays into both nostrils daily as needed for rhinitis.          Marland Kitchen gabapentin (NEURONTIN) 600 MG tablet   Oral   Take 600 mg by mouth 4 (four) times daily.          Marland Kitchen levothyroxine (SYNTHROID, LEVOTHROID) 100 MCG tablet   Oral   Take 100 mcg by mouth daily before breakfast.          . loratadine (CLARITIN) 10 MG tablet   Oral   Take 10 mg by mouth daily.         . metFORMIN (GLUCOPHAGE-XR) 500 MG 24 hr tablet   Oral   Take 500 mg by mouth daily with breakfast.         . montelukast (SINGULAIR) 10 MG tablet   Oral   Take 10 mg by mouth at bedtime.         Marland Kitchen oxyCODONE (OXY IR/ROXICODONE) 5 MG immediate release tablet   Oral   Take 5 mg by mouth 3 (three) times daily as needed for severe pain.         . pantoprazole (PROTONIX) 40 MG tablet   Oral   Take 1 tablet (40 mg total) by mouth 2 (two) times daily. Switch for any other PPI at similar dose and frequency   60 tablet   6   . sucralfate (CARAFATE) 1 g tablet   Oral   Take 1 tablet (1 g total) by mouth 4 (four) times daily -  with meals and at bedtime.   90 tablet   6   . traZODone (DESYREL) 50 MG tablet   Oral   Take 50 mg by mouth at bedtime.            Allergies Furosemide; Mold extract; Tetanus toxoids; Qvar; Toprol xl; Hctz; Nitroglycerin; and Other  Family History  Problem Relation Age of Onset  . Skin telangiectasia Mother   . Lung cancer Maternal  Grandmother   . Lung cancer Paternal Grandmother   . Breast cancer Maternal Grandmother     Social History Social History  Substance Use Topics  . Smoking  status: Former Research scientist (life sciences)  . Smokeless tobacco: None  . Alcohol Use: No    Review of Systems Constitutional: No fever/chills Eyes: No visual changes. ENT: No sore throat. Cardiovascular: Denies chest pain.See history of present illness. Respiratory: Denies shortness of breath. Gastrointestinal: No abdominal pain.  No vomiting.  No diarrhea.  No constipation. Genitourinary: Negative for dysuria. Musculoskeletal: Negative for back pain. Skin: Negative for rash. Neurological: Negative for headaches, focal weakness or numbness.  10-point ROS otherwise negative.  ____________________________________________   PHYSICAL EXAM:  VITAL SIGNS: ED Triage Vitals  Enc Vitals Group     BP 06/17/15 1644 117/99 mmHg     Pulse Rate 06/17/15 1644 62     Resp 06/17/15 1644 14     Temp 06/17/15 1644 98.3 F (36.8 C)     Temp Source 06/17/15 1644 Oral     SpO2 06/17/15 1644 93 %     Weight 06/17/15 1644 325 lb (147.419 kg)     Height 06/17/15 1644 5' 8"  (1.727 m)     Head Cir --      Peak Flow --      Pain Score 06/17/15 1645 0     Pain Loc --      Pain Edu? --      Excl. in [place]? --    Constitutional: Alert and oriented. Well appearing and in no acute distress. Eyes: Conjunctivae are normal. PERRL. EOMI. Head: Atraumatic. Nose: No congestion/rhinnorhea. Mouth/Throat: Mucous membranes are moist.  Oropharynx non-erythematous. Neck: No stridor.   Cardiovascular: Normal rate, regular rhythm. Grossly normal heart sounds.  Good peripheral circulation. Respiratory: Normal respiratory effort.  No retractions. Lungs CTAB. Gastrointestinal: Soft and nontender. Moderate to severe obesity. Musculoskeletal: No lower extremity tenderness nor edema.  No joint effusions. Neurologic:  Normal speech and language. No gross focal neurologic deficits  are appreciated. Skin:  Skin is warm, dry and intact. No rash noted. Psychiatric: Mood and affect are normal. Speech and behavior are normal.  ____________________________________________   LABS (all labs ordered are listed, but only abnormal results are displayed)  Labs Reviewed  CBC - Abnormal; Notable for the following:    RDW 22.2 (*)    Platelets 85 (*)    All other components within normal limits  BASIC METABOLIC PANEL - Abnormal; Notable for the following:    Calcium 8.8 (*)    All other components within normal limits  TROPONIN I   ____________________________________________  EKG  Reviewed and interpreted by me at 1640 Heart rate 70 Atrial paced, no evidence of acute ischemic change, multiple PVCs. QRS 120 QTc 450 PR 200  ____________________________________________  RADIOLOGY   ____________________________________________   PROCEDURES  Procedure(s) performed: None  Critical Care performed: No  ____________________________________________   INITIAL IMPRESSION / ASSESSMENT AND PLAN / ED COURSE  Pertinent labs & imaging results that were available during my care of the patient were reviewed by me and considered in my medical decision making (see chart for details).  Patient seen and evaluated for tremulousness that is resolved. Certainly does not seem to fit that of a seizure, reports bilateral hand tremors are resolved. She is now awake alert stable complaining of nothing. She did have a burning sensation in her chest is gone away, very atypical.  Discussed with the patient's doctor Dr. Chancy Milroy, he advises the patient had a coronary CT that was normal last week. He advises that he would discharged from a cardiac standpoint, and feels patient very low risk for acute cardiac disease. Patient's  EKG reassuring, pacer functioning, no evidence of acute ischemic T-wave abnormality. Symptoms very atypical. Does not seem to fit that of seizure, and she denies any  pulmonary symptoms. No abdominal pain.  Discussed results with the patient, she is very comfortable going home with plan for follow-up with her primary care doctor and Dr. Chancy Milroy. Return precautions advised. ____________________________________________   FINAL CLINICAL IMPRESSION(S) / ED DIAGNOSES  Final diagnoses:  Tremor      Delman Kitten, MD 06/17/15 2112

## 2015-06-17 NOTE — ED Notes (Signed)
Pt seen here Thursday night/Friday morning for chest pain. Was told her workup looked okay.

## 2015-06-17 NOTE — ED Notes (Signed)
Pt presents to ED via EMS from [facility] c/o jerking movements to BUE and BLE. EMS states pt also had episode of "heartburn" in [employer] that resolved prior to pt's arrival, pt states she felt like her chest was on fire. +nausea with mid-sternal, non-radiating CP, resolved on arrival. Pt denies taking any new medications, denies experiencing anything like this before. EKG with EMS unremarkable.

## 2015-06-17 NOTE — ED Notes (Signed)
Reviewed d/c instructions and follow-up care with pt. Pt verbalized understanding 

## 2015-06-23 ENCOUNTER — Ambulatory Visit: Payer: Medicare Other | Admitting: Anesthesiology

## 2015-06-30 ENCOUNTER — Ambulatory Visit: Payer: Medicare Other | Admitting: Anesthesiology

## 2015-07-13 ENCOUNTER — Other Ambulatory Visit: Payer: Self-pay | Admitting: Internal Medicine

## 2015-07-13 DIAGNOSIS — Z1231 Encounter for screening mammogram for malignant neoplasm of breast: Secondary | ICD-10-CM

## 2015-07-28 ENCOUNTER — Ambulatory Visit: Payer: Medicare Other | Attending: Internal Medicine

## 2015-08-25 ENCOUNTER — Inpatient Hospital Stay: Payer: Medicare Other | Attending: Internal Medicine

## 2015-09-01 ENCOUNTER — Inpatient Hospital Stay: Payer: Medicare Other

## 2015-09-01 ENCOUNTER — Encounter (INDEPENDENT_AMBULATORY_CARE_PROVIDER_SITE_OTHER): Payer: Self-pay

## 2015-09-01 ENCOUNTER — Inpatient Hospital Stay: Payer: Medicare Other | Attending: Internal Medicine | Admitting: Internal Medicine

## 2015-09-01 VITALS — BP 143/77 | HR 64 | Temp 99.7°F | Resp 18 | Wt 333.0 lb

## 2015-09-01 DIAGNOSIS — R109 Unspecified abdominal pain: Secondary | ICD-10-CM | POA: Diagnosis not present

## 2015-09-01 DIAGNOSIS — D500 Iron deficiency anemia secondary to blood loss (chronic): Secondary | ICD-10-CM | POA: Insufficient documentation

## 2015-09-01 DIAGNOSIS — K922 Gastrointestinal hemorrhage, unspecified: Secondary | ICD-10-CM | POA: Diagnosis not present

## 2015-09-01 DIAGNOSIS — E039 Hypothyroidism, unspecified: Secondary | ICD-10-CM | POA: Insufficient documentation

## 2015-09-01 DIAGNOSIS — E1122 Type 2 diabetes mellitus with diabetic chronic kidney disease: Secondary | ICD-10-CM | POA: Diagnosis not present

## 2015-09-01 DIAGNOSIS — R5383 Other fatigue: Secondary | ICD-10-CM | POA: Diagnosis not present

## 2015-09-01 DIAGNOSIS — J449 Chronic obstructive pulmonary disease, unspecified: Secondary | ICD-10-CM

## 2015-09-01 DIAGNOSIS — I5032 Chronic diastolic (congestive) heart failure: Secondary | ICD-10-CM | POA: Insufficient documentation

## 2015-09-01 DIAGNOSIS — G4733 Obstructive sleep apnea (adult) (pediatric): Secondary | ICD-10-CM | POA: Diagnosis not present

## 2015-09-01 DIAGNOSIS — G8929 Other chronic pain: Secondary | ICD-10-CM | POA: Diagnosis not present

## 2015-09-01 DIAGNOSIS — Z801 Family history of malignant neoplasm of trachea, bronchus and lung: Secondary | ICD-10-CM | POA: Diagnosis not present

## 2015-09-01 DIAGNOSIS — N189 Chronic kidney disease, unspecified: Secondary | ICD-10-CM

## 2015-09-01 DIAGNOSIS — Z954 Presence of other heart-valve replacement: Secondary | ICD-10-CM | POA: Diagnosis not present

## 2015-09-01 DIAGNOSIS — K5900 Constipation, unspecified: Secondary | ICD-10-CM | POA: Insufficient documentation

## 2015-09-01 DIAGNOSIS — Z79899 Other long term (current) drug therapy: Secondary | ICD-10-CM | POA: Diagnosis not present

## 2015-09-01 DIAGNOSIS — Z803 Family history of malignant neoplasm of breast: Secondary | ICD-10-CM | POA: Insufficient documentation

## 2015-09-01 DIAGNOSIS — Z87891 Personal history of nicotine dependence: Secondary | ICD-10-CM | POA: Insufficient documentation

## 2015-09-01 DIAGNOSIS — Z8719 Personal history of other diseases of the digestive system: Secondary | ICD-10-CM | POA: Diagnosis not present

## 2015-09-01 DIAGNOSIS — D509 Iron deficiency anemia, unspecified: Secondary | ICD-10-CM

## 2015-09-01 LAB — CBC WITH DIFFERENTIAL/PLATELET
Basophils Absolute: 0 10*3/uL (ref 0–0.1)
Basophils Relative: 1 %
EOS ABS: 0.2 10*3/uL (ref 0–0.7)
EOS PCT: 3 %
HCT: 37.2 % (ref 35.0–47.0)
HEMOGLOBIN: 12.9 g/dL (ref 12.0–16.0)
LYMPHS ABS: 2.9 10*3/uL (ref 1.0–3.6)
Lymphocytes Relative: 45 %
MCH: 30.9 pg (ref 26.0–34.0)
MCHC: 34.6 g/dL (ref 32.0–36.0)
MCV: 89.4 fL (ref 80.0–100.0)
MONO ABS: 0.4 10*3/uL (ref 0.2–0.9)
MONOS PCT: 7 %
NEUTROS PCT: 44 %
Neutro Abs: 2.7 10*3/uL (ref 1.4–6.5)
Platelets: 122 10*3/uL — ABNORMAL LOW (ref 150–440)
RBC: 4.16 MIL/uL (ref 3.80–5.20)
RDW: 13.5 % (ref 11.5–14.5)
WBC: 6.3 10*3/uL (ref 3.6–11.0)

## 2015-09-01 LAB — IRON AND TIBC
Iron: 53 ug/dL (ref 28–170)
Saturation Ratios: 11 % (ref 10.4–31.8)
TIBC: 395 ug/dL (ref 250–450)
UIBC: 351 ug/dL

## 2015-09-01 LAB — FERRITIN: FERRITIN: 27 ng/mL (ref 11–307)

## 2015-09-01 NOTE — Progress Notes (Signed)
[facility] NOTE  Patient Care Team: Ellamae Sia, MD as PCP - General (Internal Medicine) Robert Bellow, MD (General Surgery)  CHIEF COMPLAINTS/PURPOSE OF CONSULTATION:   # April 2017- IRON DEFICIENCY ANEMIA sec to chronic GI blood loss; [Hb-7/Feb 2017] Recom IV ferrahem  # Chronic GI blood loss- EDG/capsule [feb 2017]- gastrtitis ? NSAIDs/COLO-July 2016/ [Dr.Elliot]  # Bovine heart valve [not on anti-coagulation]  HISTORY OF PRESENTING ILLNESS:  Theresa Leon 60 y.o.  female morbidly obese multiple medical problems history of iron deficiency anemia likely GI blood loss/unclear source is here for follow-up.  Patient received IV iron approximately month ago. Patient continues to have fatigue. She has noted some improvement in her fatigue but not completely resolved.  She continues to deny any blood in stools black stools. She has poor tolerance to by mouth iron. Otherwise no nausea no vomiting. No abdominal pain or constipation. She complains of a ecchymosis of the right forearm; she is not sure how this happened.  ROS: A complete 10 point review of system is done which is negative except mentioned above in history of present illness  MEDICAL HISTORY:  Past Medical History:  Diagnosis Date  . Acute on chronic renal failure ([place])   . Anemia   . Asthma 1975  . COPD (chronic obstructive pulmonary disease) ([place])   . Diabetes ([place])   . Diabetes mellitus without complication ([place]) 7711  . Diastolic CHF, acute on chronic (HCC)   . H/O aortic valve replacement   . Heart disease   . Hypothyroid   . Morbid obesity ([place])   . 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 OSA (obstructive sleep apnea)     SURGICAL HISTORY: Past Surgical History:  Procedure Laterality Date  . aortic valve relaced   2008  . BACK SURGERY     AS CHILD  . BREAST SURGERY  September 25, 2011   intraductal papilloma of the left breast  .  CHOLECYSTECTOMY  2007  . COLONOSCOPY WITH PROPOFOL N/A 08/04/2014   Procedure: COLONOSCOPY WITH PROPOFOL;  Surgeon: Manya Silvas, MD;  Location: [facility] ENDOSCOPY;  Service: Endoscopy;  Laterality: N/A;  . ESOPHAGOGASTRODUODENOSCOPY Left 03/03/2015   Procedure: ESOPHAGOGASTRODUODENOSCOPY (EGD);  Surgeon: Manya Silvas, MD;  Location: [facility] ENDOSCOPY;  Service: Endoscopy;  Laterality: Left;  . ESOPHAGOGASTRODUODENOSCOPY (EGD) WITH PROPOFOL  08/04/2014   Procedure: ESOPHAGOGASTRODUODENOSCOPY (EGD) WITH PROPOFOL;  Surgeon: Manya Silvas, MD;  Location: [place] [facility] ENDOSCOPY;  Service: Endoscopy;;  . PHRENIC NERVE PACEMAKER IMPLANTATION  2009  . TRACHEOSTOMY TUBE PLACEMENT N/A 02/04/2014   Procedure: TRACHEOSTOMY;  Surgeon: Rozetta Nunnery, MD;  Location: [place];  Service: ENT;  Laterality: N/A;    SOCIAL HISTORY: Social History   Social History  . Marital status: Widowed    Spouse name: N/A  . Number of children: N/A  . Years of education: N/A   Occupational History  . Not on file.   Social History Main Topics  . Smoking status: Former Research scientist (life sciences)  . Smokeless tobacco: Not on file  . Alcohol use No  . Drug use: No  . Sexual activity: Not on file   Other Topics Concern  . Not on file   Social History Narrative  . No narrative on file    FAMILY HISTORY: Family History  Problem Relation Age of Onset  . Skin telangiectasia Mother   . Lung cancer Maternal Grandmother   . Lung cancer Paternal Grandmother   .  Breast cancer Maternal Grandmother     ALLERGIES:  is allergic to furosemide; mold extract [trichophyton]; tetanus toxoids; qvar [beclomethasone]; toprol xl [metoprolol tartrate]; hctz [hydrochlorothiazide]; nitroglycerin; and other.  MEDICATIONS:  Current Outpatient Prescriptions  Medication Sig Dispense Refill  . acetaminophen (TYLENOL) 500 MG tablet Take 1,000 mg by mouth every 6 (six) hours as needed for mild pain or headache.    . albuterol (PROVENTIL HFA;VENTOLIN HFA) 108  (90 BASE) MCG/ACT inhaler Inhale 2 puffs into the lungs every 6 (six) hours as needed for wheezing or shortness of breath.     Marland Kitchen atorvastatin (LIPITOR) 10 MG tablet Take 10 mg by mouth at bedtime.    . Calcium Carbonate-Vitamin D (CALCIUM 600+D) 600-400 MG-UNIT tablet Take 1 tablet by mouth at bedtime.    . cholecalciferol (VITAMIN D) 1000 units tablet Take 1,000 Units by mouth daily.    . citalopram (CELEXA) 40 MG tablet Take 40 mg by mouth daily.    Marland Kitchen EPINEPHrine (EPIPEN 2-PAK) 0.3 mg/0.3 mL IJ SOAJ injection Inject 0.3 mg into the muscle once as needed (for severe allergic reaction).    . fluticasone (FLONASE) 50 MCG/ACT nasal spray Place 2 sprays into both nostrils daily as needed for rhinitis.     Marland Kitchen gabapentin (NEURONTIN) 600 MG tablet Take 600 mg by mouth 4 (four) times daily.     Marland Kitchen levothyroxine (SYNTHROID, LEVOTHROID) 100 MCG tablet Take 100 mcg by mouth daily before breakfast.     . loratadine (CLARITIN) 10 MG tablet Take 10 mg by mouth daily.    . metFORMIN (GLUCOPHAGE-XR) 500 MG 24 hr tablet Take 500 mg by mouth daily with breakfast.    . montelukast (SINGULAIR) 10 MG tablet Take 10 mg by mouth at bedtime.    Marland Kitchen oxyCODONE (OXY IR/ROXICODONE) 5 MG immediate release tablet Take 5 mg by mouth 3 (three) times daily as needed for severe pain.    . pantoprazole (PROTONIX) 40 MG tablet Take 1 tablet (40 mg total) by mouth 2 (two) times daily. Switch for any other PPI at similar dose and frequency 60 tablet 6  . sucralfate (CARAFATE) 1 g tablet Take 1 tablet (1 g total) by mouth 4 (four) times daily -  with meals and at bedtime. 90 tablet 6  . traZODone (DESYREL) 50 MG tablet Take 50 mg by mouth at bedtime.      No current facility-administered medications for this visit.       Marland Kitchen  PHYSICAL EXAMINATION:   There were no vitals filed for this visit. There were no vitals filed for this visit.  GENERAL: Well-nourished well-developed; Alert, no distress and comfortable. Obese in a wheel  chair.   EYES: positive for pallor OROPHARYNX: no thrush or ulceration. NECK: supple, no masses felt LYMPH:  no palpable lymphadenopathy in the cervical, axillary or inguinal regions LUNGS: clear to auscultation and  No wheeze or crackles HEART/CVS: regular rate & rhythm and no murmurs; 1 plus bil lower extremity edema ABDOMEN: abdomen soft, non-tender and normal bowel sounds Musculoskeletal:no cyanosis of digits and no clubbing  PSYCH: alert & oriented x 3 with fluent speech NEURO: no focal motor/sensory deficits SKIN:  no rashes or significant lesions; ecchymosis noted on the right forearm.  LABORATORY DATA:  I have reviewed the data as listed Lab Results  Component Value Date   WBC 6.0 06/17/2015   HGB 12.4 06/17/2015   HCT 37.9 06/17/2015   MCV 86.3 06/17/2015   PLT 85 (L) 06/17/2015    Recent Labs  04/22/15 1847 05/03/15 1126 06/15/15 2330 06/17/15 1830  NA 137 135 139 139  K 3.8 4.0 3.7 4.2  CL 100* 100* 103 104  CO2 32 32 31 29  GLUCOSE 145* 200* 175* 89  BUN 9 14 15 16   CREATININE 1.12* 1.11* 1.41* 0.97  CALCIUM 8.8* 8.8* 9.0 8.8*  GFRNONAA 53* 54* 40* >60  GFRAA >60 >60 47* >60  PROT 8.0 7.4 7.2  --   ALBUMIN 3.6 3.4* 3.7  --   AST 21 19 21   --   ALT 11* 11* 17  --   ALKPHOS 84 73 71  --   BILITOT 1.9* 1.3* 1.4*  --      ASSESSMENT & PLAN:   Iron deficiency anemia due to chronic blood loss #  IRON DEFICIENCY ANEMIA- Likely GI etiology [status post GI workup as above; no clear source]. Patient responded very well to IV iron today hemoglobin is 12. However she continues to compare to fatigue. I would recommend IV Feraheme again today.  # Recheck CBC ferritin and iron studies in 3 months/possible ferriheme.  # Follow-up in 3 months.  # Right forearm ecchymosis recommend ice pack.       Cammie Sickle, MD 09/01/2015 2:01 PM

## 2015-09-01 NOTE — Progress Notes (Signed)
Patient states 2 days ago she did not feel well.  Yesterday she started having chills.  Temp today 99.7.  Patient was scheduled for labs one and one half weeks ago.  [place].

## 2015-09-01 NOTE — Assessment & Plan Note (Signed)
#    IRON DEFICIENCY ANEMIA- Likely GI etiology [status post GI workup as above; no clear source]. Patient responded very well to IV iron today hemoglobin is 12. However she continues to compare to fatigue. I would recommend IV Feraheme again today.  # Recheck CBC ferritin and iron studies in 3 months/possible ferriheme.  # Follow-up in 3 months.  # Right forearm ecchymosis recommend ice pack.

## 2015-09-06 ENCOUNTER — Telehealth: Payer: Self-pay | Admitting: Anesthesiology

## 2015-09-06 NOTE — Telephone Encounter (Signed)
Patient called to check on referral ?

## 2015-09-11 ENCOUNTER — Other Ambulatory Visit: Payer: Medicare Other

## 2015-09-11 ENCOUNTER — Inpatient Hospital Stay: Payer: Medicare Other

## 2015-09-11 ENCOUNTER — Encounter (INDEPENDENT_AMBULATORY_CARE_PROVIDER_SITE_OTHER): Payer: Self-pay

## 2015-09-11 ENCOUNTER — Encounter: Payer: Self-pay | Admitting: Internal Medicine

## 2015-09-11 ENCOUNTER — Inpatient Hospital Stay (HOSPITAL_BASED_OUTPATIENT_CLINIC_OR_DEPARTMENT_OTHER): Payer: Medicare Other | Admitting: Internal Medicine

## 2015-09-11 VITALS — BP 167/64 | HR 77 | Temp 99.1°F | Resp 19 | Ht 68.0 in | Wt 334.2 lb

## 2015-09-11 DIAGNOSIS — E039 Hypothyroidism, unspecified: Secondary | ICD-10-CM

## 2015-09-11 DIAGNOSIS — K5900 Constipation, unspecified: Secondary | ICD-10-CM

## 2015-09-11 DIAGNOSIS — G4733 Obstructive sleep apnea (adult) (pediatric): Secondary | ICD-10-CM

## 2015-09-11 DIAGNOSIS — R109 Unspecified abdominal pain: Secondary | ICD-10-CM

## 2015-09-11 DIAGNOSIS — G8929 Other chronic pain: Secondary | ICD-10-CM

## 2015-09-11 DIAGNOSIS — J449 Chronic obstructive pulmonary disease, unspecified: Secondary | ICD-10-CM

## 2015-09-11 DIAGNOSIS — D500 Iron deficiency anemia secondary to blood loss (chronic): Secondary | ICD-10-CM

## 2015-09-11 DIAGNOSIS — I5032 Chronic diastolic (congestive) heart failure: Secondary | ICD-10-CM

## 2015-09-11 DIAGNOSIS — N189 Chronic kidney disease, unspecified: Secondary | ICD-10-CM

## 2015-09-11 DIAGNOSIS — E1122 Type 2 diabetes mellitus with diabetic chronic kidney disease: Secondary | ICD-10-CM

## 2015-09-11 DIAGNOSIS — K922 Gastrointestinal hemorrhage, unspecified: Secondary | ICD-10-CM | POA: Diagnosis not present

## 2015-09-11 DIAGNOSIS — Z79899 Other long term (current) drug therapy: Secondary | ICD-10-CM

## 2015-09-11 DIAGNOSIS — R5383 Other fatigue: Secondary | ICD-10-CM

## 2015-09-11 MED ORDER — SODIUM CHLORIDE 0.9 % IV SOLN
510.0000 mg | Freq: Once | INTRAVENOUS | Status: AC
  Administered 2015-09-11: 510 mg via INTRAVENOUS
  Filled 2015-09-11: qty 17

## 2015-09-11 MED ORDER — SODIUM CHLORIDE 0.9 % IV SOLN
Freq: Once | INTRAVENOUS | Status: AC
  Administered 2015-09-11: 15:00:00 via INTRAVENOUS
  Filled 2015-09-11: qty 1000

## 2015-09-11 NOTE — Progress Notes (Signed)
[facility] NOTE  Patient Care Team: Ellamae Sia, MD as PCP - General (Internal Medicine) Robert Bellow, MD (General Surgery)  CHIEF COMPLAINTS/PURPOSE OF CONSULTATION:   # April 2017- IRON DEFICIENCY ANEMIA sec to chronic GI blood loss; [Hb-7/Feb 2017] Recom IV ferrahem  # Chronic GI blood loss- EDG/capsule [feb 2017]- gastrtitis ? NSAIDs/COLO-July 2016/ [Dr.Elliot]  # Bovine heart valve [not on anti-coagulation]  HISTORY OF PRESENTING ILLNESS:  Theresa Leon 60 y.o.  female morbidly obese multiple medical problems history of iron deficiency anemia likely GI blood loss/unclear source is here for follow-up.  Patient complains of constipation. She has been taking iron pill a day. She has been using stool softeners/laxative.  She continues to deny any blood in stools black stools. Chronic abdominal pain- question attributable to abdominal hernia.  ROS: A complete 10 point review of system is done which is negative except mentioned above in history of present illness  MEDICAL HISTORY:  Past Medical History:  Diagnosis Date  . Acute on chronic renal failure ([place])   . Anemia   . Asthma 1975  . COPD (chronic obstructive pulmonary disease) ([place])   . Diabetes ([place])   . Diabetes mellitus without complication ([place]) 2956  . Diastolic CHF, acute on chronic (HCC)   . H/O aortic valve replacement   . Heart disease   . Hypothyroid   . Morbid obesity ([place])   . 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 OSA (obstructive sleep apnea)     SURGICAL HISTORY: Past Surgical History:  Procedure Laterality Date  . aortic valve relaced   2008  . BACK SURGERY     AS CHILD  . BREAST SURGERY  September 25, 2011   intraductal papilloma of the left breast  . CHOLECYSTECTOMY  2007  . COLONOSCOPY WITH PROPOFOL N/A 08/04/2014   Procedure: COLONOSCOPY WITH PROPOFOL;  Surgeon: Manya Silvas, MD;  Location: [facility] ENDOSCOPY;  Service:  Endoscopy;  Laterality: N/A;  . ESOPHAGOGASTRODUODENOSCOPY Left 03/03/2015   Procedure: ESOPHAGOGASTRODUODENOSCOPY (EGD);  Surgeon: Manya Silvas, MD;  Location: [facility] ENDOSCOPY;  Service: Endoscopy;  Laterality: Left;  . ESOPHAGOGASTRODUODENOSCOPY (EGD) WITH PROPOFOL  08/04/2014   Procedure: ESOPHAGOGASTRODUODENOSCOPY (EGD) WITH PROPOFOL;  Surgeon: Manya Silvas, MD;  Location: [facility] ENDOSCOPY;  Service: Endoscopy;;  . PHRENIC NERVE PACEMAKER IMPLANTATION  2009  . TRACHEOSTOMY TUBE PLACEMENT N/A 02/04/2014   Procedure: TRACHEOSTOMY;  Surgeon: Rozetta Nunnery, MD;  Location: [facility];  Service: ENT;  Laterality: N/A;    SOCIAL HISTORY: Social History   Social History  . Marital status: Widowed    Spouse name: N/A  . Number of children: N/A  . Years of education: N/A   Occupational History  . Not on file.   Social History Main Topics  . Smoking status: Former Research scientist (life sciences)  . Smokeless tobacco: Not on file  . Alcohol use No  . Drug use: No  . Sexual activity: Not on file   Other Topics Concern  . Not on file   Social History Narrative  . No narrative on file    FAMILY HISTORY: Family History  Problem Relation Age of Onset  . Skin telangiectasia Mother   . Lung cancer Maternal Grandmother   . Breast cancer Maternal Grandmother   . Lung cancer Paternal Grandmother     ALLERGIES:  is allergic to furosemide; mold extract [trichophyton]; tetanus toxoids; qvar [beclomethasone]; toprol xl [metoprolol tartrate]; hctz [hydrochlorothiazide]; nitroglycerin; and  other.  MEDICATIONS:  Current Outpatient Prescriptions  Medication Sig Dispense Refill  . acetaminophen (TYLENOL) 500 MG tablet Take 1,000 mg by mouth every 6 (six) hours as needed for mild pain or headache.    . albuterol (PROVENTIL HFA;VENTOLIN HFA) 108 (90 BASE) MCG/ACT inhaler Inhale 2 puffs into the lungs every 6 (six) hours as needed for wheezing or shortness of breath.     . Calcium Carbonate-Vitamin D (CALCIUM 600+D)  600-400 MG-UNIT tablet Take 1 tablet by mouth at bedtime.    . cholecalciferol (VITAMIN D) 1000 units tablet Take 1,000 Units by mouth daily.    . citalopram (CELEXA) 40 MG tablet Take 40 mg by mouth daily.    . fluticasone (FLONASE) 50 MCG/ACT nasal spray Place 2 sprays into both nostrils daily as needed for rhinitis.     Marland Kitchen gabapentin (NEURONTIN) 600 MG tablet Take 600 mg by mouth 4 (four) times daily.     Marland Kitchen levothyroxine (SYNTHROID, LEVOTHROID) 100 MCG tablet Take 100 mcg by mouth daily before breakfast.     . loratadine (CLARITIN) 10 MG tablet Take 10 mg by mouth daily.    . metFORMIN (GLUCOPHAGE-XR) 500 MG 24 hr tablet Take 500 mg by mouth daily with breakfast.    . montelukast (SINGULAIR) 10 MG tablet Take 10 mg by mouth at bedtime.    Marland Kitchen oxyCODONE (OXY IR/ROXICODONE) 5 MG immediate release tablet Take 5 mg by mouth 3 (three) times daily as needed for severe pain.    . pantoprazole (PROTONIX) 40 MG tablet Take 1 tablet (40 mg total) by mouth 2 (two) times daily. Switch for any other PPI at similar dose and frequency 60 tablet 6  . sucralfate (CARAFATE) 1 g tablet Take 1 tablet (1 g total) by mouth 4 (four) times daily -  with meals and at bedtime. 90 tablet 6  . traZODone (DESYREL) 50 MG tablet Take 50 mg by mouth at bedtime.     Marland Kitchen atorvastatin (LIPITOR) 10 MG tablet Take 10 mg by mouth at bedtime.    Marland Kitchen EPINEPHrine (EPIPEN 2-PAK) 0.3 mg/0.3 mL IJ SOAJ injection Inject 0.3 mg into the muscle once as needed (for severe allergic reaction).     No current facility-administered medications for this visit.       Marland Kitchen  PHYSICAL EXAMINATION:   Vitals:   09/11/15 1348  BP: (!) 167/64  Pulse: 77  Resp: 19  Temp: 99.1 F (37.3 C)   Filed Weights   09/11/15 1348  Weight: (!) 334 lb 3.2 oz (151.6 kg)    GENERAL: Well-nourished well-developed; Alert, no distress and comfortable. Obese in a wheel chair.   EYES: positive for pallor OROPHARYNX: no thrush or ulceration. NECK: supple, no  masses felt LYMPH:  no palpable lymphadenopathy in the cervical, axillary or inguinal regions LUNGS: clear to auscultation and  No wheeze or crackles HEART/CVS: regular rate & rhythm and no murmurs; 1 plus bil lower extremity edema ABDOMEN: abdomen soft, non-tender and normal bowel sounds Musculoskeletal:no cyanosis of digits and no clubbing  PSYCH: alert & oriented x 3 with fluent speech NEURO: no focal motor/sensory deficits SKIN:  no rashes or significant lesions.   LABORATORY DATA:  I have reviewed the data as listed Lab Results  Component Value Date   WBC 6.3 09/01/2015   HGB 12.9 09/01/2015   HCT 37.2 09/01/2015   MCV 89.4 09/01/2015   PLT 122 (L) 09/01/2015    Recent Labs  04/22/15 1847 05/03/15 1126 06/15/15 2330 06/17/15 1830  NA 137 135 139 139  K 3.8 4.0 3.7 4.2  CL 100* 100* 103 104  CO2 32 32 31 29  GLUCOSE 145* 200* 175* 89  BUN 9 14 15 16   CREATININE 1.12* 1.11* 1.41* 0.97  CALCIUM 8.8* 8.8* 9.0 8.8*  GFRNONAA 53* 54* 40* >60  GFRAA >60 >60 47* >60  PROT 8.0 7.4 7.2  --   ALBUMIN 3.6 3.4* 3.7  --   AST 21 19 21   --   ALT 11* 11* 17  --   ALKPHOS 84 73 71  --   BILITOT 1.9* 1.3* 1.4*  --      ASSESSMENT & PLAN:   Iron deficiency anemia due to chronic blood loss #  IRON DEFICIENCY ANEMIA- Likely GI etiology [status post GI workup as above; no clear source]. S/p Ferrahem; Hb 12.5; sat 11%; Recommend IV iron today.  # Constipation sec to Iron. Con stool softner/ laxative/ miralax prn.   # Chronic abdominal pain sec to constipation. Recommend   # Recheck CBC ferritin and iron studies in 6 months- few days prior/possible ferriheme.  # Follow-up in 6 months.     Cammie Sickle, MD 09/12/2015 8:27 AM

## 2015-09-11 NOTE — Progress Notes (Signed)
Pt complaining of pain in abdominal area located at  Midline at hernia  Pt complaining that she has had severe constipation.  Has a 6/10 pain in knees and in general all over body.  Has slight fever today and reports she feels feverish.  Always feels fatigues.  Broke another tooth and needs to see dentist per pt.

## 2015-09-11 NOTE — Assessment & Plan Note (Addendum)
#    IRON DEFICIENCY ANEMIA- Likely GI etiology [status post GI workup as above; no clear source]. S/p Ferrahem; Hb 12.5; sat 11%; Recommend IV iron today.  # Constipation sec to Iron. Con stool softner/ laxative/ miralax prn.   # Chronic abdominal pain sec to constipation. Recommend   # Recheck CBC ferritin and iron studies in 6 months- few days prior/possible ferriheme.  # Follow-up in 6 months.

## 2015-09-12 ENCOUNTER — Other Ambulatory Visit: Payer: Self-pay

## 2015-09-12 DIAGNOSIS — D500 Iron deficiency anemia secondary to blood loss (chronic): Secondary | ICD-10-CM

## 2015-09-18 ENCOUNTER — Encounter: Payer: Self-pay | Admitting: Emergency Medicine

## 2015-09-18 ENCOUNTER — Emergency Department: Payer: Medicare Other

## 2015-09-18 ENCOUNTER — Other Ambulatory Visit: Payer: Self-pay

## 2015-09-18 ENCOUNTER — Observation Stay
Admission: EM | Admit: 2015-09-18 | Discharge: 2015-09-19 | Disposition: A | Discharge status: Home / Self Care | Payer: Medicare Other | Attending: Internal Medicine | Admitting: Internal Medicine

## 2015-09-18 DIAGNOSIS — Z950 Presence of cardiac pacemaker: Secondary | ICD-10-CM | POA: Diagnosis not present

## 2015-09-18 DIAGNOSIS — E1122 Type 2 diabetes mellitus with diabetic chronic kidney disease: Secondary | ICD-10-CM | POA: Insufficient documentation

## 2015-09-18 DIAGNOSIS — K219 Gastro-esophageal reflux disease without esophagitis: Secondary | ICD-10-CM | POA: Diagnosis not present

## 2015-09-18 DIAGNOSIS — R1310 Dysphagia, unspecified: Secondary | ICD-10-CM | POA: Insufficient documentation

## 2015-09-18 DIAGNOSIS — Z7984 Long term (current) use of oral hypoglycemic drugs: Secondary | ICD-10-CM | POA: Diagnosis not present

## 2015-09-18 DIAGNOSIS — N189 Chronic kidney disease, unspecified: Secondary | ICD-10-CM | POA: Insufficient documentation

## 2015-09-18 DIAGNOSIS — G4733 Obstructive sleep apnea (adult) (pediatric): Secondary | ICD-10-CM | POA: Insufficient documentation

## 2015-09-18 DIAGNOSIS — Z6841 Body Mass Index (BMI) 40.0 and over, adult: Secondary | ICD-10-CM | POA: Diagnosis not present

## 2015-09-18 DIAGNOSIS — R569 Unspecified convulsions: Secondary | ICD-10-CM | POA: Insufficient documentation

## 2015-09-18 DIAGNOSIS — N179 Acute kidney failure, unspecified: Secondary | ICD-10-CM | POA: Diagnosis not present

## 2015-09-18 DIAGNOSIS — R079 Chest pain, unspecified: Secondary | ICD-10-CM | POA: Diagnosis present

## 2015-09-18 DIAGNOSIS — I472 Ventricular tachycardia, unspecified: Secondary | ICD-10-CM

## 2015-09-18 DIAGNOSIS — I5032 Chronic diastolic (congestive) heart failure: Secondary | ICD-10-CM | POA: Diagnosis not present

## 2015-09-18 DIAGNOSIS — J449 Chronic obstructive pulmonary disease, unspecified: Secondary | ICD-10-CM | POA: Insufficient documentation

## 2015-09-18 DIAGNOSIS — E039 Hypothyroidism, unspecified: Secondary | ICD-10-CM | POA: Insufficient documentation

## 2015-09-18 DIAGNOSIS — Z87891 Personal history of nicotine dependence: Secondary | ICD-10-CM | POA: Diagnosis not present

## 2015-09-18 DIAGNOSIS — Z952 Presence of prosthetic heart valve: Secondary | ICD-10-CM | POA: Diagnosis not present

## 2015-09-18 LAB — COMPREHENSIVE METABOLIC PANEL
ALBUMIN: 3.8 g/dL (ref 3.5–5.0)
ALK PHOS: 86 U/L (ref 38–126)
ALT: 20 U/L (ref 14–54)
ANION GAP: 4 — AB (ref 5–15)
AST: 26 U/L (ref 15–41)
BILIRUBIN TOTAL: 1.3 mg/dL — AB (ref 0.3–1.2)
BUN: 18 mg/dL (ref 6–20)
CALCIUM: 9.2 mg/dL (ref 8.9–10.3)
CO2: 31 mmol/L (ref 22–32)
Chloride: 103 mmol/L (ref 101–111)
Creatinine, Ser: 1.09 mg/dL — ABNORMAL HIGH (ref 0.44–1.00)
GFR, EST NON AFRICAN AMERICAN: 55 mL/min — AB (ref >60)
GLUCOSE: 165 mg/dL — AB (ref 65–99)
Potassium: 3.8 mmol/L (ref 3.5–5.1)
Sodium: 138 mmol/L (ref 135–145)
TOTAL PROTEIN: 7.2 g/dL (ref 6.5–8.1)

## 2015-09-18 LAB — CBC WITH DIFFERENTIAL/PLATELET
Basophils Absolute: 0 10*3/uL (ref 0–0.1)
Basophils Relative: 1 %
Eosinophils Absolute: 0.2 10*3/uL (ref 0–0.7)
Eosinophils Relative: 3 %
HEMATOCRIT: 38.1 % (ref 35.0–47.0)
HEMOGLOBIN: 13.5 g/dL (ref 12.0–16.0)
LYMPHS ABS: 2.9 10*3/uL (ref 1.0–3.6)
LYMPHS PCT: 44 %
MCH: 32.1 pg (ref 26.0–34.0)
MCHC: 35.4 g/dL (ref 32.0–36.0)
MCV: 90.5 fL (ref 80.0–100.0)
MONOS PCT: 6 %
Monocytes Absolute: 0.4 10*3/uL (ref 0.2–0.9)
NEUTROS ABS: 3 10*3/uL (ref 1.4–6.5)
NEUTROS PCT: 46 %
Platelets: 110 10*3/uL — ABNORMAL LOW (ref 150–440)
RBC: 4.21 MIL/uL (ref 3.80–5.20)
RDW: 14 % (ref 11.5–14.5)
WBC: 6.5 10*3/uL (ref 3.6–11.0)

## 2015-09-18 LAB — MAGNESIUM: MAGNESIUM: 1.8 mg/dL (ref 1.7–2.4)

## 2015-09-18 LAB — GLUCOSE, CAPILLARY: Glucose-Capillary: 117 mg/dL — ABNORMAL HIGH (ref 65–99)

## 2015-09-18 LAB — TROPONIN I: Troponin I: <0.03 ng/mL (ref <0.03)

## 2015-09-18 MED ORDER — GABAPENTIN 600 MG PO TABS
600.0000 mg | ORAL_TABLET | Freq: Four times a day (QID) | ORAL | Status: DC
  Administered 2015-09-18: 600 mg via ORAL
  Filled 2015-09-18 (×3): qty 1

## 2015-09-18 MED ORDER — PANTOPRAZOLE SODIUM 40 MG PO TBEC
40.0000 mg | DELAYED_RELEASE_TABLET | Freq: Two times a day (BID) | ORAL | Status: DC
  Administered 2015-09-18 – 2015-09-19 (×2): 40 mg via ORAL
  Filled 2015-09-18 (×2): qty 1

## 2015-09-18 MED ORDER — ASPIRIN 81 MG PO CHEW
324.0000 mg | CHEWABLE_TABLET | Freq: Once | ORAL | Status: AC
  Administered 2015-09-18: 324 mg via ORAL
  Filled 2015-09-18: qty 4

## 2015-09-18 MED ORDER — ACETAMINOPHEN 325 MG PO TABS: 650.0000 mg | ORAL_TABLET | Freq: Four times a day (QID) | ORAL | Status: DC | PRN

## 2015-09-18 MED ORDER — LEVOTHYROXINE SODIUM 100 MCG PO TABS
100.0000 ug | ORAL_TABLET | Freq: Every day | ORAL | Status: DC
  Administered 2015-09-19: 100 ug via ORAL
  Filled 2015-09-18: qty 1

## 2015-09-18 MED ORDER — VITAMIN D 1000 UNITS PO TABS
1000.0000 [IU] | ORAL_TABLET | Freq: Every day | ORAL | Status: DC
  Administered 2015-09-19: 1000 [IU] via ORAL
  Filled 2015-09-18: qty 1

## 2015-09-18 MED ORDER — ONDANSETRON HCL 4 MG PO TABS
4.0000 mg | ORAL_TABLET | Freq: Four times a day (QID) | ORAL | Status: DC | PRN
Start: 2015-09-18 — End: 2015-09-19

## 2015-09-18 MED ORDER — FLUTICASONE PROPIONATE 50 MCG/ACT NA SUSP
2.0000 | Freq: Every day | NASAL | Status: DC | PRN
  Filled 2015-09-18: qty 16

## 2015-09-18 MED ORDER — ACETAMINOPHEN 650 MG RE SUPP: 650.0000 mg | Freq: Four times a day (QID) | RECTAL | Status: DC | PRN

## 2015-09-18 MED ORDER — SUCRALFATE 1 G PO TABS
1.0000 g | ORAL_TABLET | Freq: Three times a day (TID) | ORAL | Status: DC
  Administered 2015-09-18 – 2015-09-19 (×3): 1 g via ORAL
  Filled 2015-09-18 (×3): qty 1

## 2015-09-18 MED ORDER — MONTELUKAST SODIUM 10 MG PO TABS
10.0000 mg | ORAL_TABLET | Freq: Every day | ORAL | Status: DC
  Administered 2015-09-18: 10 mg via ORAL
  Filled 2015-09-18: qty 1

## 2015-09-18 MED ORDER — ATORVASTATIN CALCIUM 10 MG PO TABS
10.0000 mg | ORAL_TABLET | Freq: Every day | ORAL | Status: DC
  Administered 2015-09-18: 10 mg via ORAL
  Filled 2015-09-18: qty 1

## 2015-09-18 MED ORDER — OXYCODONE HCL 5 MG PO TABS: 5.0000 mg | ORAL_TABLET | ORAL | Status: DC | PRN

## 2015-09-18 MED ORDER — LORATADINE 10 MG PO TABS
10.0000 mg | ORAL_TABLET | Freq: Every day | ORAL | Status: DC
  Administered 2015-09-19: 10 mg via ORAL
  Filled 2015-09-18: qty 1

## 2015-09-18 MED ORDER — ONDANSETRON HCL 4 MG/2ML IJ SOLN: 4.0000 mg | Freq: Four times a day (QID) | INTRAMUSCULAR | Status: DC | PRN

## 2015-09-18 MED ORDER — CALCIUM CARBONATE-VITAMIN D 600-400 MG-UNIT PO TABS: 1.0000 | ORAL_TABLET | Freq: Every day | ORAL | Status: DC

## 2015-09-18 MED ORDER — SODIUM CHLORIDE 0.9% FLUSH
3.0000 mL | Freq: Two times a day (BID) | INTRAVENOUS | Status: DC
  Administered 2015-09-18 – 2015-09-19 (×2): 3 mL via INTRAVENOUS

## 2015-09-18 MED ORDER — INSULIN ASPART 100 UNIT/ML ~~LOC~~ SOLN: 0.0000 [IU] | Freq: Every day | SUBCUTANEOUS | Status: DC

## 2015-09-18 MED ORDER — CALCIUM CARBONATE-VITAMIN D 500-200 MG-UNIT PO TABS
1.0000 | ORAL_TABLET | Freq: Every day | ORAL | Status: DC
  Administered 2015-09-18: 1 via ORAL
  Filled 2015-09-18: qty 1

## 2015-09-18 MED ORDER — TRAZODONE HCL 50 MG PO TABS
50.0000 mg | ORAL_TABLET | Freq: Every day | ORAL | Status: DC
  Administered 2015-09-18: 50 mg via ORAL
  Filled 2015-09-18: qty 1

## 2015-09-18 MED ORDER — ENOXAPARIN SODIUM 40 MG/0.4ML ~~LOC~~ SOLN
40.0000 mg | SUBCUTANEOUS | Status: DC
  Administered 2015-09-18: 40 mg via SUBCUTANEOUS
  Filled 2015-09-18: qty 0.4

## 2015-09-18 MED ORDER — CITALOPRAM HYDROBROMIDE 20 MG PO TABS
40.0000 mg | ORAL_TABLET | Freq: Every day | ORAL | Status: DC
  Administered 2015-09-19: 40 mg via ORAL
  Filled 2015-09-18: qty 2

## 2015-09-18 MED ORDER — INSULIN ASPART 100 UNIT/ML ~~LOC~~ SOLN
0.0000 [IU] | Freq: Three times a day (TID) | SUBCUTANEOUS | Status: DC
Start: 2015-09-19 — End: 2015-09-19
  Administered 2015-09-19 (×2): 2 [IU] via SUBCUTANEOUS
  Filled 2015-09-18 (×2): qty 2

## 2015-09-18 MED ORDER — OXYCODONE HCL 5 MG PO TABS
10.0000 mg | ORAL_TABLET | ORAL | Status: DC | PRN
  Administered 2015-09-18 – 2015-09-19 (×2): 10 mg via ORAL
  Filled 2015-09-18 (×3): qty 2
  Filled 2015-09-18: qty 1

## 2015-09-18 NOTE — H&P (Signed)
[facility] [place] NAME: Theresa Leon    MR#:  [id number]  DATE OF BIRTH:  [birth year]   DATE OF ADMISSION:  09/18/2015  PRIMARY CARE PHYSICIAN: Theresa Neita Carp, MD   REQUESTING/REFERRING PHYSICIAN: Edd Leon  CHIEF COMPLAINT:   Chief Complaint  Patient presents with  . Seizures    HISTORY OF PRESENT ILLNESS:  Theresa Leon  is a 60 y.o. female with a known history of Congestive heart failure, pacemaker insertion who is presenting after "seizures" she states she was outside to watch the eclipse, felt hot and dizzy so went back inside. She says while lying down her body started shaking-while shaking she was screaming for help. She told her family she was having a seizure they said she was not, brought to the hospital.  While in the emergency department no further "seizure" activity but, did have intermittent ventricular tachycardia which spontaneously resolved. During that episode she had associated chest pain and palpitations.  PAST MEDICAL HISTORY:   Past Medical History:  Diagnosis Date  . Acute on chronic renal failure ([place])   . Anemia   . Asthma 1975  . COPD (chronic obstructive pulmonary disease) ([place])   . Diabetes ([place])   . Diabetes mellitus without complication ([place]) 3428  . Diastolic CHF, acute on chronic (HCC)   . H/O aortic valve replacement   . Heart disease   . Hypothyroid   . Morbid obesity ([place])   . 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 OSA (obstructive sleep apnea)     PAST SURGICAL HISTORY:   Past Surgical History:  Procedure Laterality Date  . aortic valve relaced   2008  . BACK SURGERY     AS CHILD  . BREAST SURGERY  September 25, 2011   intraductal papilloma of the left breast  . CHOLECYSTECTOMY  2007  . COLONOSCOPY WITH PROPOFOL N/A 08/04/2014   Procedure: COLONOSCOPY WITH PROPOFOL;  Surgeon: Theresa Silvas, MD;  Location: [facility] ENDOSCOPY;  Service: Endoscopy;   Laterality: N/A;  . ESOPHAGOGASTRODUODENOSCOPY Left 03/03/2015   Procedure: ESOPHAGOGASTRODUODENOSCOPY (EGD);  Surgeon: Theresa Silvas, MD;  Location[facility]COPY;  Service: Endoscopy;  Laterality: Left;  . ESOPHAGOGASTRODUODENOSCOPY (EGD) WITH PROPOFOL  08/04/2014   Procedure: ESOPHAGOGASTRODUODENOSCOPY (EGD) WITH PROPOFOL;  Surgeon: Theresa Silvas, MD;  Location: [facility] ENDOSCOPY;  Service: Endoscopy;;  . PHRENIC NERVE PACEMAKER IMPLANTATION  2009  . TRACHEOSTOMY TUBE PLACEMENT N/A 02/04/2014   Procedure: TRACHEOSTOMY;  Surgeon: Theresa Nunnery, MD;  Location: [facility];  Service: ENT;  Laterality: N/A;    SOCIAL HISTORY:   Social History  Substance Use Topics  . Smoking status: Former Research scientist (life sciences)  . Smokeless tobacco: Never Used  . Alcohol use No    FAMILY HISTORY:   Family History  Problem Relation Age of Onset  . Skin telangiectasia Mother   . Lung cancer Maternal Grandmother   . Breast cancer Maternal Grandmother   . Lung cancer Paternal Grandmother     DRUG ALLERGIES:   Allergies  Allergen Reactions  . Furosemide Other (See Comments)    Pt states that this medication caused renal failure.    . Mold Extract [Trichophyton] Anaphylaxis  . Tetanus Toxoids Other (See Comments)    Pt states that her arm turns black.    Theresa Leon [Beclomethasone] Other (See Comments)    Reaction:  Thrush   . Toprol Xl [Metoprolol Tartrate] Hives  . Hctz [Hydrochlorothiazide]  Other (See Comments)    Reports renal failure   . Nickel   . Nitroglycerin Other (See Comments)    Pt states that this medication makes her BP bottom out.    . Other Hives, Swelling and Other (See Comments)    Pt states that she is allergic to steroids.      REVIEW OF SYSTEMS:  REVIEW OF SYSTEMS:  CONSTITUTIONAL: Denies fevers, chills, fatigue, weakness.  EYES: Denies blurred vision, double vision, or eye pain.  EARS, NOSE, THROAT: Denies tinnitus, ear pain, hearing loss.  RESPIRATORY: denies cough, shortness of  breath, wheezing  CARDIOVASCULAR: Positive chest pain, palpitations, denies edema.  GASTROINTESTINAL: Denies nausea, vomiting, diarrhea, abdominal pain.  GENITOURINARY: Denies dysuria, hematuria.  ENDOCRINE: Denies nocturia or thyroid problems. HEMATOLOGIC AND LYMPHATIC: Denies easy bruising or bleeding.  SKIN: Denies rash or lesions.  MUSCULOSKELETAL: Denies pain in neck, back, shoulder, knees, hips, or further arthritic symptoms.  NEUROLOGIC: Denies paralysis, paresthesias.  PSYCHIATRIC: Denies anxiety or depressive symptoms. Otherwise full review of systems performed by me is negative.   MEDICATIONS AT HOME:   Prior to Admission medications   Medication Sig Start Date End Date Taking? Authorizing Provider  atorvastatin (LIPITOR) 10 MG tablet Take 10 mg by mouth at bedtime.   Yes Historical Provider, MD  Calcium Carbonate-Vitamin D (CALCIUM 600+D) 600-400 MG-UNIT tablet Take 1 tablet by mouth at bedtime.   Yes Historical Provider, MD  cholecalciferol (VITAMIN D) 1000 units tablet Take 1,000 Units by mouth daily.   Yes Historical Provider, MD  citalopram (CELEXA) 40 MG tablet Take 40 mg by mouth daily.   Yes Historical Provider, MD  fluticasone (FLONASE) 50 MCG/ACT nasal spray Place 2 sprays into both nostrils daily as needed for rhinitis.    Yes Historical Provider, MD  gabapentin (NEURONTIN) 600 MG tablet Take 600 mg by mouth 4 (four) times daily.    Yes Historical Provider, MD  levothyroxine (SYNTHROID, LEVOTHROID) 100 MCG tablet Take 100 mcg by mouth daily before breakfast.    Yes Historical Provider, MD  loratadine (CLARITIN) 10 MG tablet Take 10 mg by mouth daily.   Yes Historical Provider, MD  metFORMIN (GLUCOPHAGE-XR) 500 MG 24 hr tablet Take 500 mg by mouth daily with breakfast.   Yes Historical Provider, MD  montelukast (SINGULAIR) 10 MG tablet Take 10 mg by mouth at bedtime.   Yes Historical Provider, MD  Oxycodone HCl 10 MG TABS Take 10 mg by mouth 3 (three) times daily as  needed for severe pain.    Yes Historical Provider, MD  pantoprazole (PROTONIX) 40 MG tablet Take 1 tablet (40 mg total) by mouth 2 (two) times daily. Switch for any other PPI at similar dose and frequency 03/05/15  Yes Theodoro Grist, MD  sucralfate (CARAFATE) 1 g tablet Take 1 tablet (1 g total) by mouth 4 (four) times daily -  with meals and at bedtime. 03/05/15  Yes Theodoro Grist, MD  traZODone (DESYREL) 50 MG tablet Take 50 mg by mouth at bedtime.    Yes Historical Provider, MD  acetaminophen (TYLENOL) 500 MG tablet Take 1,000 mg by mouth every 6 (six) hours as needed for mild pain or headache.    Historical Provider, MD  albuterol (PROVENTIL HFA;VENTOLIN HFA) 108 (90 BASE) MCG/ACT inhaler Inhale 2 puffs into the lungs every 6 (six) hours as needed for wheezing or shortness of breath.     Historical Provider, MD  EPINEPHrine (EPIPEN 2-PAK) 0.3 mg/0.3 mL IJ SOAJ injection Inject 0.3 mg into the  muscle once as needed (for severe allergic reaction).    Historical Provider, MD      VITAL SIGNS:  Blood pressure (!) 103/58, pulse 62, temperature 98.4 F (36.9 C), temperature source Oral, resp. rate 15, height 5' 8"  (1.727 m), weight (!) 149.2 kg (329 lb), SpO2 98 %.  PHYSICAL EXAMINATION:  VITAL SIGNS: Vitals:   09/18/15 1730 09/18/15 1800  BP: 111/71 (!) 103/58  Pulse: (!) 58 62  Resp:    Temp:     GENERAL:59 y.o.female currently in no acute distress.  HEAD: Normocephalic, atraumatic.  EYES: Pupils equal, round, reactive to light. Extraocular muscles intact. No scleral icterus.  MOUTH: Moist mucosal membrane. Dentition intact. No abscess noted.  EAR, NOSE, THROAT: Clear without exudates. No external lesions.  NECK: Supple. No thyromegaly. No nodules. No JVD.  PULMONARY: Clear to ascultation, without wheeze rails or rhonci. No use of accessory muscles, Good respiratory effort. good air entry bilaterally CHEST: Nontender to palpation.  CARDIOVASCULAR: S1 and S2. Regular rate and rhythm. No  murmurs, rubs, or gallops. 1+ edema. Pedal pulses 2+ bilaterally.  GASTROINTESTINAL: Soft, nontender, nondistended. No masses. Positive bowel sounds. No hepatosplenomegaly.  MUSCULOSKELETAL: No swelling, clubbing, or edema. Range of motion full in all extremities.  NEUROLOGIC: Cranial nerves II through XII are intact. No gross focal neurological deficits. Sensation intact. Reflexes intact.  SKIN: No ulceration, lesions, rashes, or cyanosis. Skin warm and dry. Turgor intact.  PSYCHIATRIC: Mood, affect within normal limits. The patient is awake, alert and oriented x 3. Insight, judgment intact.    LABORATORY PANEL:   CBC  Recent Labs Lab 09/18/15 1600  WBC 6.5  HGB 13.5  HCT 38.1  PLT 110*   ------------------------------------------------------------------------------------------------------------------  Chemistries   Recent Labs Lab 09/18/15 1600  NA 138  K 3.8  CL 103  CO2 31  GLUCOSE 165*  BUN 18  CREATININE 1.09*  CALCIUM 9.2  AST 26  ALT 20  ALKPHOS 86  BILITOT 1.3*   ------------------------------------------------------------------------------------------------------------------  Cardiac Enzymes No results for input(s): TROPONINI in the last 168 hours. ------------------------------------------------------------------------------------------------------------------  RADIOLOGY:  Ct Head Wo Contrast  Result Date: 09/18/2015 CLINICAL DATA:  Seizure, headache. EXAM: CT HEAD WITHOUT CONTRAST TECHNIQUE: Contiguous axial images were obtained from the base of the skull through the vertex without intravenous contrast. COMPARISON:  07/13/2013. FINDINGS: Brain: No evidence of an acute infarct, acute hemorrhage, mass lesion, mass effect or hydrocephalus. Vascular: No hyperdense vessel or unexpected calcification. Skull: No acute findings. Sinuses/Orbits: No air-fluid levels in the paranasal sinuses or mastoid air cells. Other: But no acute findings. IMPRESSION: No acute  intracranial abnormality. Electronically Signed   By: Lorin Picket M.D.   On: 09/18/2015 16:49   Dg Chest Portable 1 View  Result Date: 09/18/2015 CLINICAL DATA:  Seizure EXAM: PORTABLE CHEST 1 VIEW COMPARISON:  06/16/2015 FINDINGS: Lungs are clear.  No pleural effusion or pneumothorax. Cardiomegaly.  Left subclavian pacemaker. Median sternotomy. Defibrillator pads overlying the left hemithorax. IMPRESSION: No evidence of acute cardiopulmonary disease. Electronically Signed   By: Julian Hy M.D.   On: 09/18/2015 18:51    EKG:   Orders placed or performed during the hospital encounter of 09/18/15  . ED EKG  . ED EKG    IMPRESSION AND PLAN:   60 year old Caucasian female history of congestive heart failure, unspecified ejection fraction presenting with palpitations ventricular tachycardia.  1. Intermittent ventricular tachycardia: Place on telemetry trend cardiac enzymes echocardiogram will consult cardiology follows with Dr. Humphrey Rolls. 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 Type 2 diabetes non-insulin-requiring: Hold oral agents at insulin sliding scale 4. GERD without esophagitis PPI therapy 5. Hypothyroidism unspecified: Synthroid  All the records are reviewed and case discussed with ED provider. Management plans discussed with the patient, family and they are in agreement.  CODE STATUS: Full  TOTAL TIME TAKING CARE OF THIS PATIENT: 33 minutes.    Hower,  [name] on 09/18/2015 at 7:24 PM  Between 7am to 6pm - Pager - [phone]  After 6pm: House Pager: - [phone]  [facility]  Office  [phone]  CC: Primary care physician; Ellamae Sia, MD

## 2015-09-18 NOTE — ED Provider Notes (Signed)
[facility] Emergency Department Provider Note   ____________________________________________   First MD Initiated Contact with Patient 09/18/15 1540     (approximate)  I have reviewed the triage vital signs and the nursing notes.   HISTORY  Chief Complaint Seizures    HPI Theresa Leon is a 60 y.o. female with history of iron deficiency anemia, diabetes, diastolic CHF, s/p pacemaker, obesity, obstructive sleep apnea on BiPAP who presents for evaluation of seizure-like activity today which began suddenly just prior to arrival, now resolved, initially severe, no modifying factors. Patient reports that at approximately 2:30 PM she went out to view the eclipse and she began feeling lightheaded and developed a "bad headache". She had no vision change. She went back inside, lay down in her bed and put on her CPAP so that she could watch the rest of it looks on the television. She reports at that time she began having jerking movements of the arms and legs which were noted by her family members and were intermittent, she was able to follow commands to roll onto her left side during this spell. Her family member knows "I don't think it was a grand mal seizure because I seen that before but she was shaking all over". The patient did not bite her tongue, she was not incontinent of bowel or bladder, she did not fall off the bed or hit her head. She had a similar episode in May of this year for which she was evaluated in the emergency department and she was told "it wasn't a seizure". She also followed up with a neurologist who thought that this was not "a real seizure". She denies any recent illness including no cough, vomiting, diarrhea, fevers or chills. No chest pain or difficulty breathing. Currently her headache is 3 out of 10 and she reports the pain is improved significantly without any intervention.   Past Medical History:  Diagnosis Date  . Acute on chronic renal  failure ([place])   . Anemia   . Asthma 1975  . COPD (chronic obstructive pulmonary disease) ([place])   . Diabetes ([place])   . Diabetes mellitus without complication ([place]) 4650  . Diastolic CHF, acute on chronic (HCC)   . H/O aortic valve replacement   . Heart disease   . Hypothyroid   . Morbid obesity ([place])   . 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 OSA (obstructive sleep apnea)     Patient Active Problem List   Diagnosis Date Noted  . Iron deficiency anemia due to chronic blood loss 05/03/2015  . Gastritis due to nonsteroidal anti-inflammatory drug (NSAID) 03/05/2015  . History of esophagogastroduodenoscopy (EGD) 03/05/2015  . Type 2 diabetes mellitus ([place]) 03/01/2015  . GERD (gastroesophageal reflux disease) 03/01/2015  . HLD (hyperlipidemia) 03/01/2015  . COPD (chronic obstructive pulmonary disease) ([place]) 03/01/2015  . Chronic diastolic CHF (congestive heart failure) ([place]) 03/01/2015  . Hypothyroidism 03/01/2015  . OSA on CPAP 03/01/2015  . Acute posthemorrhagic anemia 03/01/2015  . Dysphagia   . CHF (congestive heart failure) ([place])   . Acute respiratory failure with hypoxia ([place]) 01/26/2014  . Encounter for imaging study to confirm orogastric (OG) tube placement   . Respiratory failure ([place])   . 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 aortic valve relaced   2008  . BACK SURGERY     AS CHILD  . BREAST SURGERY  September 25, 2011   intraductal papilloma of the left breast  . CHOLECYSTECTOMY  2007  . COLONOSCOPY WITH PROPOFOL N/A 08/04/2014   Procedure: COLONOSCOPY WITH PROPOFOL;  Surgeon: Manya Silvas, MD;  Location: [facility] ENDOSCOPY;  Service: Endoscopy;  Laterality: N/A;  . ESOPHAGOGASTRODUODENOSCOPY Left 03/03/2015   Procedure: ESOPHAGOGASTRODUODENOSCOPY (EGD);  Surgeon: Manya Silvas, MD;  Location: [facility] ENDOSCOPY;  Service: Endoscopy;  Laterality: Left;  .  ESOPHAGOGASTRODUODENOSCOPY (EGD) WITH PROPOFOL  08/04/2014   Procedure: ESOPHAGOGASTRODUODENOSCOPY (EGD) WITH PROPOFOL;  Surgeon: Manya Silvas, MD;  Location: [place] ENDOSCOPY;  Service: Endoscopy;;  . PHRENIC NERVE PACEMAKER IMPLANTATION  2009  . TRACHEOSTOMY TUBE PLACEMENT N/A 02/04/2014   Procedure: TRACHEOSTOMY;  Surgeon: Rozetta Nunnery, MD;  Location: [place];  Service: ENT;  Laterality: N/A;    Prior to Admission medications   Medication Sig Start Date End Date Taking? Authorizing Provider  acetaminophen (TYLENOL) 500 MG tablet Take 1,000 mg by mouth every 6 (six) hours as needed for mild pain or headache.    Historical Provider, MD  albuterol (PROVENTIL HFA;VENTOLIN HFA) 108 (90 BASE) MCG/ACT inhaler Inhale 2 puffs into the lungs every 6 (six) hours as needed for wheezing or shortness of breath.     Historical Provider, MD  atorvastatin (LIPITOR) 10 MG tablet Take 10 mg by mouth at bedtime.    Historical Provider, MD  Calcium Carbonate-Vitamin D (CALCIUM 600+D) 600-400 MG-UNIT tablet Take 1 tablet by mouth at bedtime.    Historical Provider, MD  cholecalciferol (VITAMIN D) 1000 units tablet Take 1,000 Units by mouth daily.    Historical Provider, MD  citalopram (CELEXA) 40 MG tablet Take 40 mg by mouth daily.    Historical Provider, MD  EPINEPHrine (EPIPEN 2-PAK) 0.3 mg/0.3 mL IJ SOAJ injection Inject 0.3 mg into the muscle once as needed (for severe allergic reaction).    Historical Provider, MD  fluticasone (FLONASE) 50 MCG/ACT nasal spray Place 2 sprays into both nostrils daily as needed for rhinitis.     Historical Provider, MD  gabapentin (NEURONTIN) 600 MG tablet Take 600 mg by mouth 4 (four) times daily.     Historical Provider, MD  levothyroxine (SYNTHROID, LEVOTHROID) 100 MCG tablet Take 100 mcg by mouth daily before breakfast.     Historical Provider, MD  loratadine (CLARITIN) 10 MG tablet Take 10 mg by mouth daily.    Historical Provider, MD  metFORMIN (GLUCOPHAGE-XR) 500 MG  24 hr tablet Take 500 mg by mouth daily with breakfast.    Historical Provider, MD  montelukast (SINGULAIR) 10 MG tablet Take 10 mg by mouth at bedtime.    Historical Provider, MD  oxyCODONE (OXY IR/ROXICODONE) 5 MG immediate release tablet Take 5 mg by mouth 3 (three) times daily as needed for severe pain.    Historical Provider, MD  pantoprazole (PROTONIX) 40 MG tablet Take 1 tablet (40 mg total) by mouth 2 (two) times daily. Switch for any other PPI at similar dose and frequency 03/05/15   Theodoro Grist, MD  sucralfate (CARAFATE) 1 g tablet Take 1 tablet (1 g total) by mouth 4 (four) times daily -  with meals and at bedtime. 03/05/15   Theodoro Grist, MD  traZODone (DESYREL) 50 MG tablet Take 50 mg by mouth at bedtime.     Historical Provider, MD    Allergies Furosemide; Mold extract [trichophyton]; Tetanus toxoids; Qvar [beclomethasone]; Toprol xl [metoprolol tartrate]; Hctz [hydrochlorothiazide]; Nickel; Nitroglycerin; and Other  Family History  Problem Relation Age of Onset  . Skin  telangiectasia Mother   . Lung cancer Maternal Grandmother   . Breast cancer Maternal Grandmother   . Lung cancer Paternal Grandmother     Social History Social History  Substance Use Topics  . Smoking status: Former Research scientist (life sciences)  . Smokeless tobacco: Never Used  . Alcohol use No    Review of Systems Constitutional: No fever/chills Eyes: No visual changes. ENT: No sore throat. Cardiovascular: Denies chest pain. Respiratory: Denies shortness of breath. Gastrointestinal: No abdominal pain.  No nausea, no vomiting.  No diarrhea.  No constipation. Genitourinary: Negative for dysuria. Musculoskeletal: Negative for back pain. Skin: Negative for rash. Neurological: Positive for headaches, no focal weakness or numbness.  10-point ROS otherwise negative.  ____________________________________________   PHYSICAL EXAM:  Vitals:   09/18/15 1545 09/18/15 1548 09/18/15 1600 09/18/15 1724  BP: 108/70  (!) 91/58  123/61  Pulse: 65  61 65  Resp: 16  15   Temp: 98.4 F (36.9 C)     TempSrc: Oral     SpO2: 92%  94% 98%  Weight:  (!) 329 lb (149.2 kg)    Height:  5' 8"  (1.727 m)      VITAL SIGNS: ED Triage Vitals  Enc Vitals Group     BP      Pulse      Resp      Temp      Temp src      SpO2      Weight      Height      Head Circumference      Peak Flow      Pain Score      Pain Loc      Pain Edu?      Excl. in [place]?     Constitutional: Alert and oriented. Well appearing and in no acute distress. Eyes: Conjunctivae are normal. PERRL. EOMI. Head: Atraumatic. Nose: No congestion/rhinnorhea. Mouth/Throat: Mucous membranes are moist.  Oropharynx non-erythematous. Neck: No stridor. Supple without meningismus. Cardiovascular: Normal rate, regular rhythm. Grossly normal heart sounds.  Good peripheral circulation. Respiratory: Normal respiratory effort.  No retractions. Lungs CTAB. Gastrointestinal: Soft and nontender. No distention.No CVA tenderness. Genitourinary: deferred Musculoskeletal: No lower extremity tenderness nor edema.  No joint effusions. Neurologic:  Normal speech and language. No gross focal neurologic deficits are appreciated. No gait instability. 5 out of 5 strength in bilateral upper and lower extremity, sensation intact to light touch throughout, cranial nerves II through XII intact. Skin:  Skin is warm, dry and intact. No rash noted. Psychiatric: Mood and affect are normal. Speech and behavior are normal.  ____________________________________________   LABS (all labs ordered are listed, but only abnormal results are displayed)  Labs Reviewed  CBC WITH DIFFERENTIAL/PLATELET - Abnormal; Notable for the following:       Result Value   Platelets 110 (*)    All other components within normal limits  COMPREHENSIVE METABOLIC PANEL - Abnormal; Notable for the following:    Glucose, Bld 165 (*)    Creatinine, Ser 1.09 (*)    Total Bilirubin 1.3 (*)    GFR calc non Af  Amer 55 (*)    Anion gap 4 (*)    All other components within normal limits  TROPONIN I  MAGNESIUM  CBG MONITORING, ED   ____________________________________________  EKG  ED ECG REPORT I, Joanne Gavel, the attending physician, personally viewed and interpreted this ECG.   Date: 09/18/2015  EKG Time: 15:47  Rate: 63  Rhythm: normal sinus  rhythm with PACs  Axis: normal  Intervals:Incomplete right bundle-branch block and LAFB  ST&T Change: No acute ST elevation or acute ST depression.  EKG unchanged from 09/28/2014.  ED ECG REPORT I, Joanne Gavel, the attending physician, personally viewed and interpreted this ECG.   Date: 09/18/2015  EKG Time: 18:25  Rate: 60  Rhythm: Atrial paced rhythm.   ____________________________________________  RADIOLOGY  CT head   IMPRESSION:  No acute intracranial abnormality.       ____________________________________________   PROCEDURES  Procedure(s) performed: None  Procedures  Critical Care performed: No  ____________________________________________   INITIAL IMPRESSION / ASSESSMENT AND PLAN / ED COURSE  Pertinent labs & imaging results that were available during my care of the patient were reviewed by me and considered in my medical decision making (see chart for details).  Theresa Leon is a 60 y.o. female with history of iron deficiency anemia, diabetes, diastolic CHF, obesity, obstructive sleep apnea on BiPAP who presents for evaluation of seizure-like activity today which began suddenly just prior to arrival, now resolved, initially severe, no modifying factors. She also had headache which was severe initially but has significantly improved. On exam, she is very well-appearing and in no acute distress, vital signs stable, she is afebrile. Neck supple without meningismus and I doubt meningitis. She has an intact neurological exam. Her story is not exactly consistent with seizure, she was responsive throughout the  event, was not incontinent of bowel or bladder and did not bite her tongue. It is possible that this could have represented a complex partial seizure, I imagine but it is also possible that this is nonepileptic especially given her history of seizures in the past which were not thought to be "real seizures". However, we'll obtain screening labs, CT head, EKG and reassess for disposition.  ----------------------------------------- 6:30 PM on 09/18/2015 -----------------------------------------  Patient observed several hours in the emergency department without any recurrence of seizure-like activity. CT head shows no acute intervention process. CBC and CMP generally unremarkable however patient did have a 16 beat run of sustained ventricular tachycardia where she experienced chest pain and shortness of breath. Zoll pads applied. Vtach spontaneously resolved. Repeat EKG now showing paced rhythm. Patient confirms she has a pacemaker but not an AICD. I discussed the case with the hospitalist for admission. Troponin ordered, magnesium ordered, chest x-ray ordered and pending. We'll give aspirin.  Clinical Course     ____________________________________________   FINAL CLINICAL IMPRESSION(S) / ED DIAGNOSES  Final diagnoses:  Seizure-like activity ([place])  Ventricular tachycardia (HCC)  Chest pain, unspecified chest pain type      NEW MEDICATIONS STARTED DURING THIS VISIT:  New Prescriptions   No medications on file     Note:  This document was prepared using Dragon voice recognition software and may include unintentional dictation errors.    Joanne Gavel, MD 09/18/15 [phone]

## 2015-09-18 NOTE — ED Triage Notes (Signed)
Per [facility]: Pt. Reports seizure for 5 minutes and remembers the shaking. Prior to pt. Was viewing the eclipse and developed a headache, so went to lay down with the cpap machine and that is when the seizure occurred. No post ictal state reported, just dizzy and lightheaded. Pt. Had same occurrence 3 months ago and followed up with neurology that found no obvious abnormalities.

## 2015-09-19 ENCOUNTER — Observation Stay
Admit: 2015-09-19 | Discharge: 2015-09-19 | Disposition: A | Discharge status: Home / Self Care | Payer: Medicare Other | Attending: Internal Medicine | Admitting: Internal Medicine

## 2015-09-19 LAB — HEMOGLOBIN A1C: Hgb A1c MFr Bld: 5.7 % (ref 4.0–6.0)

## 2015-09-19 LAB — ECHOCARDIOGRAM COMPLETE
Height: 68 in
Weight: 5264 oz

## 2015-09-19 LAB — GLUCOSE, CAPILLARY
GLUCOSE-CAPILLARY: 200 mg/dL — AB (ref 65–99)
Glucose-Capillary: 157 mg/dL — ABNORMAL HIGH (ref 65–99)

## 2015-09-19 LAB — TROPONIN I: Troponin I: <0.03 ng/mL (ref <0.03)

## 2015-09-19 MED ORDER — ENOXAPARIN SODIUM 40 MG/0.4ML ~~LOC~~ SOLN: 40.0000 mg | Freq: Two times a day (BID) | SUBCUTANEOUS | Status: DC

## 2015-09-19 MED ORDER — GABAPENTIN 300 MG PO CAPS
600.0000 mg | ORAL_CAPSULE | Freq: Four times a day (QID) | ORAL | Status: DC
  Administered 2015-09-19: 600 mg via ORAL
  Filled 2015-09-19: qty 2

## 2015-09-19 NOTE — Progress Notes (Signed)
Patient discharged home as ordered,instructions explained and well understood,escorted by son and Engineer, manufacturing via wheel chair

## 2015-09-19 NOTE — Progress Notes (Signed)
Order for enoxaparin 40 mg subcutaneously daily changed to q12h per anticoagulation protocol for CrCl > 30 mL/min and BMI >40.  Theresa Leon Theresa Leon 11:39 AM

## 2015-09-19 NOTE — Progress Notes (Signed)
*  PRELIMINARY RESULTS* Echocardiogram 2D Echocardiogram has been performed.  Sherrie Sport 09/19/2015, 10:54 AM

## 2015-09-19 NOTE — Progress Notes (Signed)
-F2   Theresa Leon is a 60 y.o. female  [id number]  Primary Cardiologist: Neoma Laming Reason for Consultation: Chest pain  HPI: This is a 60 year old white female with a past medical history of difficulty swallowing presented to the hospital when she all of a sudden had difficulty swallowing and then developed chest pain. No further chest pain and MI has been ruled out. There was an episode of nonsustained V. tach.   Review of Systems: No orthopnea PND or leg swelling   Past Medical History:  Diagnosis Date  . Acute on chronic renal failure ([place])   . Anemia   . Asthma 1975  . COPD (chronic obstructive pulmonary disease) ([place])   . Diabetes ([place])   . Diabetes mellitus without complication ([place]) 9211  . Diastolic CHF, acute on chronic (HCC)   . H/O aortic valve replacement   . Heart disease   . Hypothyroid   . Morbid obesity ([place])   . 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 OSA (obstructive sleep apnea)     Medications Prior to Admission  Medication Sig Dispense Refill  . atorvastatin (LIPITOR) 10 MG tablet Take 10 mg by mouth at bedtime.    . Calcium Carbonate-Vitamin D (CALCIUM 600+D) 600-400 MG-UNIT tablet Take 1 tablet by mouth at bedtime.    . cholecalciferol (VITAMIN D) 1000 units tablet Take 1,000 Units by mouth daily.    . citalopram (CELEXA) 40 MG tablet Take 40 mg by mouth daily.    . fluticasone (FLONASE) 50 MCG/ACT nasal spray Place 2 sprays into both nostrils daily as needed for rhinitis.     Marland Kitchen gabapentin (NEURONTIN) 600 MG tablet Take 600 mg by mouth 4 (four) times daily.     Marland Kitchen levothyroxine (SYNTHROID, LEVOTHROID) 100 MCG tablet Take 100 mcg by mouth daily before breakfast.     . loratadine (CLARITIN) 10 MG tablet Take 10 mg by mouth daily.    . metFORMIN (GLUCOPHAGE-XR) 500 MG 24 hr tablet Take 500 mg by mouth daily with breakfast.    . montelukast (SINGULAIR) 10 MG tablet Take 10 mg by mouth at bedtime.    . Oxycodone HCl  10 MG TABS Take 10 mg by mouth 3 (three) times daily as needed for severe pain.     . pantoprazole (PROTONIX) 40 MG tablet Take 1 tablet (40 mg total) by mouth 2 (two) times daily. Switch for any other PPI at similar dose and frequency 60 tablet 6  . sucralfate (CARAFATE) 1 g tablet Take 1 tablet (1 g total) by mouth 4 (four) times daily -  with meals and at bedtime. 90 tablet 6  . traZODone (DESYREL) 50 MG tablet Take 50 mg by mouth at bedtime.     Marland Kitchen acetaminophen (TYLENOL) 500 MG tablet Take 1,000 mg by mouth every 6 (six) hours as needed for mild pain or headache.    . albuterol (PROVENTIL HFA;VENTOLIN HFA) 108 (90 BASE) MCG/ACT inhaler Inhale 2 puffs into the lungs every 6 (six) hours as needed for wheezing or shortness of breath.     . EPINEPHrine (EPIPEN 2-PAK) 0.3 mg/0.3 mL IJ SOAJ injection Inject 0.3 mg into the muscle once as needed (for severe allergic reaction).       Marland Kitchen atorvastatin  10 mg Oral QHS  . calcium-vitamin D  1 tablet Oral QHS  . cholecalciferol  1,000 Units Oral Daily  . citalopram  40 mg Oral Daily  . enoxaparin (LOVENOX)  injection  40 mg Subcutaneous Q24H  . gabapentin  600 mg Oral QID  . insulin aspart  0-5 Units Subcutaneous QHS  . insulin aspart  0-9 Units Subcutaneous TID WC  . levothyroxine  100 mcg Oral QAC breakfast  . loratadine  10 mg Oral Daily  . montelukast  10 mg Oral QHS  . pantoprazole  40 mg Oral BID  . sodium chloride flush  3 mL Intravenous Q12H  . sucralfate  1 g Oral TID WC & HS  . traZODone  50 mg Oral QHS    Infusions:    Allergies  Allergen Reactions  . Furosemide Other (See Comments)    Pt states that this medication caused renal failure.    . Mold Extract [Trichophyton] Anaphylaxis  . Tetanus Toxoids Other (See Comments)    Pt states that her arm turns black.    Badman Crigler [Beclomethasone] Other (See Comments)    Reaction:  Thrush   . Toprol Xl [Metoprolol Tartrate] Hives  . Hctz [Hydrochlorothiazide] Other (See Comments)     Reports renal failure   . Nickel   . Nitroglycerin Other (See Comments)    Pt states that this medication makes her BP bottom out.    . Other Hives, Swelling and Other (See Comments)    Pt states that she is allergic to steroids.      Social History   Social History  . Marital status: Widowed    Spouse name: N/A  . Number of children: N/A  . Years of education: N/A   Occupational History  . Not on file.   Social History Main Topics  . Smoking status: Former Research scientist (life sciences)  . Smokeless tobacco: Never Used  . Alcohol use No  . Drug use: No  . Sexual activity: Not on file   Other Topics Concern  . Not on file   Social History Narrative  . No narrative on file    Family History  Problem Relation Age of Onset  . Skin telangiectasia Mother   . Lung cancer Maternal Grandmother   . Breast cancer Maternal Grandmother   . Lung cancer Paternal Grandmother     PHYSICAL EXAM: Vitals:   09/18/15 2013 09/19/15 0450  BP: (!) 110/59 (!) 116/57  Pulse: (!) 59 (!) 59  Resp: 20 15  Temp: 98.7 F (37.1 C) 98 F (36.7 C)    No intake or output data in the 24 hours ending 09/19/15 0835  General:  Well appearing. No respiratory difficulty HEENT: normal Neck: supple. no JVD. Carotids 2+ bilat; no bruits. No lymphadenopathy or thryomegaly appreciated. Cor: PMI nondisplaced. Regular rate & rhythm. No rubs, gallops or murmurs. Lungs: clear Abdomen: soft, nontender, nondistended. No hepatosplenomegaly. No bruits or masses. Good bowel sounds. Extremities: no cyanosis, clubbing, rash, edema Neuro: alert & oriented x 3, cranial nerves grossly intact. moves all 4 extremities w/o difficulty. Affect pleasant.  ECG:AV sequential paced rhythm 100%  Results for orders placed or performed during the hospital encounter of 09/18/15 (from the past 24 hour(s))  CBC with Differential     Status: Abnormal   Collection Time: 09/18/15  4:00 PM  Result Value Ref Range   WBC 6.5 3.6 - 11.0 K/uL   RBC  4.21 3.80 - 5.20 MIL/uL   Hemoglobin 13.5 12.0 - 16.0 g/dL   HCT 38.1 35.0 - 47.0 %   MCV 90.5 80.0 - 100.0 fL   MCH 32.1 26.0 - 34.0 pg   MCHC 35.4 32.0 - 36.0 g/dL  RDW 14.0 11.5 - 14.5 %   Platelets 110 (L) 150 - 440 K/uL   Neutrophils Relative % 46 %   Neutro Abs 3.0 1.4 - 6.5 K/uL   Lymphocytes Relative 44 %   Lymphs Abs 2.9 1.0 - 3.6 K/uL   Monocytes Relative 6 %   Monocytes Absolute 0.4 0.2 - 0.9 K/uL   Eosinophils Relative 3 %   Eosinophils Absolute 0.2 0 - 0.7 K/uL   Basophils Relative 1 %   Basophils Absolute 0.0 0 - 0.1 K/uL  Comprehensive metabolic panel     Status: Abnormal   Collection Time: 09/18/15  4:00 PM  Result Value Ref Range   Sodium 138 135 - 145 mmol/L   Potassium 3.8 3.5 - 5.1 mmol/L   Chloride 103 101 - 111 mmol/L   CO2 31 22 - 32 mmol/L   Glucose, Bld 165 (H) 65 - 99 mg/dL   BUN 18 6 - 20 mg/dL   Creatinine, Ser 1.09 (H) 0.44 - 1.00 mg/dL   Calcium 9.2 8.9 - 10.3 mg/dL   Total Protein 7.2 6.5 - 8.1 g/dL   Albumin 3.8 3.5 - 5.0 g/dL   AST 26 15 - 41 U/L   ALT 20 14 - 54 U/L   Alkaline Phosphatase 86 38 - 126 U/L   Total Bilirubin 1.3 (H) 0.3 - 1.2 mg/dL   GFR calc non Af Amer 55 (L) >60 mL/min   GFR calc Af Amer >60 >60 mL/min   Anion gap 4 (L) 5 - 15  Troponin I     Status: None   Collection Time: 09/18/15  6:43 PM  Result Value Ref Range   Troponin I <0.03 <0.03 ng/mL  Magnesium     Status: None   Collection Time: 09/18/15  6:43 PM  Result Value Ref Range   Magnesium 1.8 1.7 - 2.4 mg/dL  Troponin I (q 6hr x 3)     Status: None   Collection Time: 09/18/15  8:48 PM  Result Value Ref Range   Troponin I <0.03 <0.03 ng/mL  Glucose, capillary     Status: Abnormal   Collection Time: 09/18/15  9:09 PM  Result Value Ref Range   Glucose-Capillary 117 (H) 65 - 99 mg/dL  Troponin I (q 6hr x 3)     Status: None   Collection Time: 09/19/15  2:38 AM  Result Value Ref Range   Troponin I <0.03 <0.03 ng/mL   Ct Head Wo Contrast  Result Date:  09/18/2015 CLINICAL DATA:  Seizure, headache. EXAM: CT HEAD WITHOUT CONTRAST TECHNIQUE: Contiguous axial images were obtained from the base of the skull through the vertex without intravenous contrast. COMPARISON:  07/13/2013. FINDINGS: Brain: No evidence of an acute infarct, acute hemorrhage, mass lesion, mass effect or hydrocephalus. Vascular: No hyperdense vessel or unexpected calcification. Skull: No acute findings. Sinuses/Orbits: No air-fluid levels in the paranasal sinuses or mastoid air cells. Other: But no acute findings. IMPRESSION: No acute intracranial abnormality. Electronically Signed   By: Lorin Picket M.D.   On: 09/18/2015 16:49   Dg Chest Portable 1 View  Result Date: 09/18/2015 CLINICAL DATA:  Seizure EXAM: PORTABLE CHEST 1 VIEW COMPARISON:  06/16/2015 FINDINGS: Lungs are clear.  No pleural effusion or pneumothorax. Cardiomegaly.  Left subclavian pacemaker. Median sternotomy. Defibrillator pads overlying the left hemithorax. IMPRESSION: No evidence of acute cardiopulmonary disease. Electronically Signed   By: Julian Hy M.D.   On: 09/18/2015 18:51     ASSESSMENT AND PLAN: Patient had atypical  chest pain and EKG shows AV sequential paced rhythm with MI being ruled out. Patient can be discharged with follow-up in the office on Thursday at 2 PM.  Paolina Karwowski A

## 2015-09-19 NOTE — Care Management Obs Status (Signed)
MEDICARE OBSERVATION STATUS NOTIFICATION   Patient Details  Name: Theresa Leon MRN: [id number] Date of Birth: 1955-06-29   Medicare Observation Status Notification Given:  Yes    Katrina Stack, RN 09/19/2015, 9:20 AM

## 2015-09-19 NOTE — Progress Notes (Signed)
Echocardiogram showed normal wall motion with normal left ventricle systolic function. Patient has ruled out for myocardial infarction and had unremarkable stress tests recently. May discharge the patient with outpatient workup such as CTA coronaries

## 2015-09-20 NOTE — Discharge Summary (Signed)
[facility] NAME: Shahidah Nesbitt    MR#:  [id number]  DATE OF BIRTH:  12-Jan-1956  DATE OF ADMISSION:  09/18/2015 ADMITTING PHYSICIAN: Lytle Butte, MD  DATE OF DISCHARGE: 09/19/2015  4:02 PM  PRIMARY CARE PHYSICIAN: Harriett Neita Carp, MD   ADMISSION DIAGNOSIS:  Ventricular tachycardia ([place]) [I47.2] Seizure-like activity ([place]) [R56.9] Chest pain, unspecified chest pain type [R07.9]  DISCHARGE DIAGNOSIS:  Active Problems:   V tach ([place])   SECONDARY DIAGNOSIS:   Past Medical History:  Diagnosis Date  . Acute on chronic renal failure ([place])   . Anemia   . Asthma 1975  . COPD (chronic obstructive pulmonary disease) ([place])   . Diabetes ([place])   . Diabetes mellitus without complication ([place]) 5027  . Diastolic CHF, acute on chronic (HCC)   . H/O aortic valve replacement   . Heart disease   . Hypothyroid   . Morbid obesity ([place])   . 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 OSA (obstructive sleep apnea)      ADMITTING HISTORY  Keyshia Orwick  is a 60 y.o. female with a known history of Congestive heart failure, pacemaker insertion who is presenting after "seizures" she states she was outside to watch the eclipse, felt hot and dizzy so went back inside. She says while lying down her body started shaking-while shaking she was screaming for help. She told her family she was having a seizure they said she was not, brought to the hospital.  While in the emergency department no further "seizure" activity but, did have intermittent ventricular tachycardia which spontaneously resolved. During that episode she had associated chest pain and palpitations.  HOSPITAL COURSE:   * Nonsustained V. tach and chest pain Patient was admitted to telemetry floor. Continued on home medications. Troponin remained stable on repeat lab work. Telemetry on the floor showed no arrhythmias. Seen by cardiology Dr. Humphrey Rolls  who suggested following up as outpatient.  Patient is being discharged home in a stable condition and her home medications remain unchanged.  CONSULTS OBTAINED:  Treatment Team:  Lytle Butte, MD Dionisio David, MD  DRUG ALLERGIES:   Allergies  Allergen Reactions  . Furosemide Other (See Comments)    Pt states that this medication caused renal failure.    . Mold Extract [Trichophyton] Anaphylaxis  . Tetanus Toxoids Other (See Comments)    Pt states that her arm turns black.    Urbani Crigler [Beclomethasone] Other (See Comments)    Reaction:  Thrush   . Toprol Xl [Metoprolol Tartrate] Hives  . Hctz [Hydrochlorothiazide] Other (See Comments)    Reports renal failure   . Nickel   . Nitroglycerin Other (See Comments)    Pt states that this medication makes her BP bottom out.    . Other Hives, Swelling and Other (See Comments)    Pt states that she is allergic to steroids.      DISCHARGE MEDICATIONS:   Discharge Medication List as of 09/19/2015  2:49 PM    CONTINUE these medications which have NOT CHANGED   Details  atorvastatin (LIPITOR) 10 MG tablet Take 10 mg by mouth at bedtime., Historical Med    Calcium Carbonate-Vitamin D (CALCIUM 600+D) 600-400 MG-UNIT tablet Take 1 tablet by mouth at bedtime., Historical Med    cholecalciferol (VITAMIN D) 1000 units tablet Take 1,000 Units by mouth daily., Historical Med    citalopram (CELEXA) 40  MG tablet Take 40 mg by mouth daily., Historical Med    fluticasone (FLONASE) 50 MCG/ACT nasal spray Place 2 sprays into both nostrils daily as needed for rhinitis. , Historical Med    gabapentin (NEURONTIN) 600 MG tablet Take 600 mg by mouth 4 (four) times daily. , Historical Med    levothyroxine (SYNTHROID, LEVOTHROID) 100 MCG tablet Take 100 mcg by mouth daily before breakfast. , Historical Med    loratadine (CLARITIN) 10 MG tablet Take 10 mg by mouth daily., Historical Med    metFORMIN (GLUCOPHAGE-XR) 500 MG 24 hr tablet Take 500 mg by  mouth daily with breakfast., Historical Med    montelukast (SINGULAIR) 10 MG tablet Take 10 mg by mouth at bedtime., Historical Med    Oxycodone HCl 10 MG TABS Take 10 mg by mouth 3 (three) times daily as needed for severe pain. , Historical Med    pantoprazole (PROTONIX) 40 MG tablet Take 1 tablet (40 mg total) by mouth 2 (two) times daily. Switch for any other PPI at similar dose and frequency, Starting Sun 03/05/2015, Print    sucralfate (CARAFATE) 1 g tablet Take 1 tablet (1 g total) by mouth 4 (four) times daily -  with meals and at bedtime., Starting Sun 03/05/2015, Normal    traZODone (DESYREL) 50 MG tablet Take 50 mg by mouth at bedtime. , Historical Med    acetaminophen (TYLENOL) 500 MG tablet Take 1,000 mg by mouth every 6 (six) hours as needed for mild pain or headache., Historical Med    albuterol (PROVENTIL HFA;VENTOLIN HFA) 108 (90 BASE) MCG/ACT inhaler Inhale 2 puffs into the lungs every 6 (six) hours as needed for wheezing or shortness of breath. , Historical Med    EPINEPHrine (EPIPEN 2-PAK) 0.3 mg/0.3 mL IJ SOAJ injection Inject 0.3 mg into the muscle once as needed (for severe allergic reaction)., Historical Med        Today   VITAL SIGNS:  Blood pressure 130/66, pulse 64, temperature 98.6 F (37 C), temperature source Oral, resp. rate 16, height 5' 8"  (1.727 m), weight (!) 149.2 kg (329 lb), SpO2 91 %.  I/O:  No intake or output data in the 24 hours ending 09/20/15 1443  PHYSICAL EXAMINATION:  Physical Exam  GENERAL:  60 y.o.-year-old patient lying in the bed with no acute distress.  LUNGS: Normal breath sounds bilaterally, no wheezing, rales,rhonchi or crepitation. No use of accessory muscles of respiration.  CARDIOVASCULAR: S1, S2 normal. No murmurs, rubs, or gallops.  ABDOMEN: Soft, non-tender, non-distended. Bowel sounds present. No organomegaly or mass.  NEUROLOGIC: Moves all 4 extremities. PSYCHIATRIC: The patient is alert and oriented x 3.  SKIN: No  obvious rash, lesion, or ulcer.   DATA REVIEW:   CBC  Recent Labs Lab 09/18/15 1600  WBC 6.5  HGB 13.5  HCT 38.1  PLT 110*    Chemistries   Recent Labs Lab 09/18/15 1600 09/18/15 1843  NA 138  --   K 3.8  --   CL 103  --   CO2 31  --   GLUCOSE 165*  --   BUN 18  --   CREATININE 1.09*  --   CALCIUM 9.2  --   MG  --  1.8  AST 26  --   ALT 20  --   ALKPHOS 86  --   BILITOT 1.3*  --     Cardiac Enzymes  Recent Labs Lab 09/19/15 0829  TROPONINI <0.03    Microbiology Results  Results  for orders placed or performed during the hospital encounter of 01/25/14  Culture, respiratory (NON-Expectorated)     Status: None   Collection Time: 01/26/14 12:59 PM  Result Value Ref Range Status   Specimen Description TRACHEAL ASPIRATE  Final   Special Requests Normal  Final   Gram Stain   Final    FEW WBC PRESENT,BOTH PMN AND MONONUCLEAR RARE SQUAMOUS EPITHELIAL CELLS PRESENT FEW GRAM POSITIVE COCCI IN PAIRS IN CLUSTERS Performed at [facility]    Culture   Final    Non-Pathogenic Oropharyngeal-type Flora Isolated. Performed at [facility]    Report Status 01/30/2014 FINAL  Final  Clostridium Difficile by PCR     Status: Abnormal   Collection Time: 01/27/14 10:49 AM  Result Value Ref Range Status   Toxigenic C Difficile by pcr POSITIVE (A) NEGATIVE Final    Comment: CRITICAL RESULT CALLED TO, READ BACK BY AND VERIFIED WITH: V. DAVIS RN 12:35 01/27/14 (wilsonm)   Culture, blood (routine x 2)     Status: None   Collection Time: 01/27/14  1:10 PM  Result Value Ref Range Status   Specimen Description BLOOD RIGHT HAND  Final   Special Requests BOTTLES DRAWN AEROBIC ONLY 5CC  Final   Culture   Final    NO GROWTH 5 DAYS Performed at [facility]    Report Status 02/02/2014 FINAL  Final  Culture, blood (routine x 2)     Status: None   Collection Time: 01/27/14  3:10 PM  Result Value Ref Range Status   Specimen Description BLOOD RIGHT ARM   Final   Special Requests BOTTLES DRAWN AEROBIC ONLY 6CC  Final   Culture   Final    NO GROWTH 5 DAYS Performed at [facility]    Report Status 02/02/2014 FINAL  Final  Culture, respiratory (NON-Expectorated)     Status: None   Collection Time: 02/01/14  9:36 AM  Result Value Ref Range Status   Specimen Description TRACHEAL ASPIRATE  Final   Special Requests NONE  Final   Gram Stain   Final    MODERATE WBC PRESENT, PREDOMINANTLY PMN RARE SQUAMOUS EPITHELIAL CELLS PRESENT RARE GRAM POSITIVE COCCI IN PAIRS RARE GRAM NEGATIVE RODS Performed at [facility]    Culture   Final    Non-Pathogenic Oropharyngeal-type Flora Isolated. Performed at [facility]    Report Status 02/04/2014 FINAL  Final  Clostridium Difficile by PCR     Status: None   Collection Time: 02/03/14 12:10 PM  Result Value Ref Range Status   Toxigenic C Difficile by pcr NEGATIVE NEGATIVE Final  Culture, Urine     Status: None   Collection Time: 02/13/14  9:12 PM  Result Value Ref Range Status   Specimen Description URINE, RANDOM  Final   Special Requests NONE  Final   Colony Count   Final    >=100,000 COLONIES/ML Performed at [facility]    Culture   Final    ESCHERICHIA COLI Performed at [facility]    Report Status 02/16/2014 FINAL  Final   Organism ID, Bacteria ESCHERICHIA COLI  Final      Susceptibility   Escherichia coli - MIC*    AMPICILLIN >=32 RESISTANT Resistant     CEFAZOLIN >=64 RESISTANT Resistant     CEFTRIAXONE >=64 RESISTANT Resistant     CIPROFLOXACIN <=0.25 SENSITIVE Sensitive     GENTAMICIN <=1 SENSITIVE Sensitive     LEVOFLOXACIN <=0.12 SENSITIVE Sensitive  NITROFURANTOIN <=16 SENSITIVE Sensitive     TOBRAMYCIN <=1 SENSITIVE Sensitive     TRIMETH/SULFA <=20 SENSITIVE Sensitive     PIP/TAZO 64 INTERMEDIATE Intermediate     * ESCHERICHIA COLI  Culture, blood (routine x 2)     Status: None   Collection Time: 02/13/14 10:00 PM  Result  Value Ref Range Status   Specimen Description BLOOD LEFT ARM  Final   Special Requests BOTTLES DRAWN AEROBIC AND ANAEROBIC 10CC EACH  Final   Culture   Final    NO GROWTH 5 DAYS Performed at [facility]    Report Status 02/20/2014 FINAL  Final  Culture, blood (routine x 2)     Status: None   Collection Time: 02/13/14 10:15 PM  Result Value Ref Range Status   Specimen Description BLOOD RIGHT ARM  Final   Special Requests BOTTLES DRAWN AEROBIC AND ANAEROBIC 10CCE [place]  Final   Culture   Final    NO GROWTH 5 DAYS Performed at [facility]    Report Status 02/20/2014 FINAL  Final  Culture, respiratory (NON-Expectorated)     Status: None   Collection Time: 02/13/14 10:15 PM  Result Value Ref Range Status   Specimen Description TRACHEAL ASPIRATE  Final   Special Requests NONE  Final   Gram Stain   Final    MODERATE WBC PRESENT,BOTH PMN AND MONONUCLEAR RARE SQUAMOUS EPITHELIAL CELLS PRESENT ABUNDANT GRAM POSITIVE RODS RARE GRAM POSITIVE COCCI IN PAIRS IN CLUSTERS    Culture   Final    Non-Pathogenic Oropharyngeal-type Flora Isolated. Performed at [facility]    Report Status 02/16/2014 FINAL  Final  Clostridium Difficile by PCR     Status: Abnormal   Collection Time: 02/14/14  9:20 AM  Result Value Ref Range Status   Toxigenic C Difficile by pcr POSITIVE (A) NEGATIVE Final    Comment: CRITICAL RESULT CALLED TO, READ BACK BY AND VERIFIED WITH: RGae Dry RN 11:15 02/14/14 (wilsonm)   Culture, respiratory (NON-Expectorated)     Status: None   Collection Time: 02/23/14  4:18 PM  Result Value Ref Range Status   Specimen Description TRACHEAL ASPIRATE  Final   Special Requests NONE  Final   Gram Stain   Final    RARE WBC PRESENT, PREDOMINANTLY MONONUCLEAR RARE SQUAMOUS EPITHELIAL CELLS PRESENT NO ORGANISMS SEEN Performed at [facility] Performed at Center Performed at [facility]    Report Status 02/26/2014 FINAL  Final  Stat Gram stain     Status: None   Collection Time: 02/23/14  4:19 PM  Result Value Ref Range Status   Specimen Description TRACHEAL ASPIRATE  Final   Special Requests NONE  Final   Gram Stain   Final    RARE WBC PRESENT, PREDOMINANTLY MONONUCLEAR NO ORGANISMS SEEN    Report Status 02/23/2014 FINAL  Final  Culture, Urine     Status: None   Collection Time: 03/11/14  2:10 PM  Result Value Ref Range Status   Specimen Description URINE, RANDOM  Final   Special Requests NONE  Final   Colony Count   Final    >=100,000 COLONIES/ML Performed at [facility]    Culture   Final    ESCHERICHIA COLI YEAST Performed at [facility]    Report Status 03/14/2014 FINAL  Final   Organism ID, Bacteria ESCHERICHIA COLI  Final  Susceptibility   Escherichia coli - MIC*    AMPICILLIN >=32 RESISTANT Resistant     CEFAZOLIN >=64 RESISTANT Resistant     CEFTRIAXONE >=64 RESISTANT Resistant     CIPROFLOXACIN <=0.25 SENSITIVE Sensitive     GENTAMICIN <=1 SENSITIVE Sensitive     LEVOFLOXACIN <=0.12 SENSITIVE Sensitive     NITROFURANTOIN <=16 SENSITIVE Sensitive     TOBRAMYCIN <=1 SENSITIVE Sensitive     TRIMETH/SULFA <=20 SENSITIVE Sensitive     * ESCHERICHIA COLI  Culture, blood (routine x 2)     Status: None   Collection Time: 03/11/14  3:10 PM  Result Value Ref Range Status   Specimen Description BLOOD RIGHT FOREARM  Final   Special Requests   Final    BOTTLES DRAWN AEROBIC AND ANAEROBIC 10CC AER,5CC ANA   Culture   Final    NO GROWTH 5 DAYS Performed at [facility]    Report Status 03/17/2014 FINAL  Final  Culture, blood (routine x 2)     Status: None   Collection Time: 03/11/14  3:15 PM  Result Value Ref Range Status   Specimen Description BLOOD LEFT WRIST  Final   Special Requests BOTTLES DRAWN AEROBIC AND ANAEROBIC 10CC  Final   Culture   Final    NO GROWTH 5 DAYS Performed at [facility]     Report Status 03/17/2014 FINAL  Final    RADIOLOGY:  Ct Head Wo Contrast  Result Date: 09/18/2015 CLINICAL DATA:  Seizure, headache. EXAM: CT HEAD WITHOUT CONTRAST TECHNIQUE: Contiguous axial images were obtained from the base of the skull through the vertex without intravenous contrast. COMPARISON:  07/13/2013. FINDINGS: Brain: No evidence of an acute infarct, acute hemorrhage, mass lesion, mass effect or hydrocephalus. Vascular: No hyperdense vessel or unexpected calcification. Skull: No acute findings. Sinuses/Orbits: No air-fluid levels in the paranasal sinuses or mastoid air cells. Other: But no acute findings. IMPRESSION: No acute intracranial abnormality. Electronically Signed   By: Lorin Picket M.D.   On: 09/18/2015 16:49   Dg Chest Portable 1 View  Result Date: 09/18/2015 CLINICAL DATA:  Seizure EXAM: PORTABLE CHEST 1 VIEW COMPARISON:  06/16/2015 FINDINGS: Lungs are clear.  No pleural effusion or pneumothorax. Cardiomegaly.  Left subclavian pacemaker. Median sternotomy. Defibrillator pads overlying the left hemithorax. IMPRESSION: No evidence of acute cardiopulmonary disease. Electronically Signed   By: Julian Hy M.D.   On: 09/18/2015 18:51    Follow up with PCP in 1 week.  Management plans discussed with the patient, family and they are in agreement.  CODE STATUS:  Code Status History    Date Active Date Inactive Code Status Order ID Comments User Context   09/18/2015  7:11 PM 09/19/2015  9:29 AM Full Code [id number]  Lytle Butte, MD ED   03/01/2015 10:05 PM 03/05/2015  5:21 PM Full Code [id number]  Lance Coon, MD Inpatient   01/25/2014  7:09 PM 03/15/2014  4:32 PM Full Code [id number]  Carney Harder Inpatient    Advance Directive Documentation   Flowsheet Row Most Recent Value  Type of Advance Directive  Healthcare Power of Attorney, Living will  Pre-existing out of facility DNR order (yellow form or pink MOST form)  No data  "MOST" Form in Place?  No data      TOTAL  TIME TAKING CARE OF THIS PATIENT ON DAY OF DISCHARGE: more than 30 minutes.   Hillary Bow R M.D on 09/20/2015 at 2:43 PM  Between 7am to 6pm - Pager -  [phone]  After 6pm go to [email or web address] - password [email or web address] [facility]  Office  [phone]  CC: Primary care physician; Ellamae Sia, MD  Note: This dictation was prepared with Dragon dictation along with smaller phrase technology. Any transcriptional errors that result from this process are unintentional.

## 2015-11-10 ENCOUNTER — Other Ambulatory Visit: Payer: Self-pay | Admitting: Internal Medicine

## 2015-11-10 DIAGNOSIS — D3500 Benign neoplasm of unspecified adrenal gland: Secondary | ICD-10-CM

## 2015-11-15 ENCOUNTER — Ambulatory Visit
Admission: RE | Admit: 2015-11-15 | Discharge: 2015-11-15 | Disposition: A | Discharge status: Home / Self Care | Payer: Medicare Other | Source: Ambulatory Visit | Attending: Internal Medicine | Admitting: Internal Medicine

## 2015-11-15 DIAGNOSIS — D3500 Benign neoplasm of unspecified adrenal gland: Secondary | ICD-10-CM

## 2015-11-15 DIAGNOSIS — K429 Umbilical hernia without obstruction or gangrene: Secondary | ICD-10-CM | POA: Diagnosis not present

## 2015-11-15 DIAGNOSIS — N200 Calculus of kidney: Secondary | ICD-10-CM | POA: Diagnosis not present

## 2015-11-15 DIAGNOSIS — I700 Atherosclerosis of aorta: Secondary | ICD-10-CM | POA: Insufficient documentation

## 2015-11-15 DIAGNOSIS — D3502 Benign neoplasm of left adrenal gland: Secondary | ICD-10-CM | POA: Diagnosis present

## 2016-03-06 ENCOUNTER — Inpatient Hospital Stay: Payer: Medicare Other | Attending: Internal Medicine

## 2016-03-06 DIAGNOSIS — E039 Hypothyroidism, unspecified: Secondary | ICD-10-CM | POA: Insufficient documentation

## 2016-03-06 DIAGNOSIS — E1122 Type 2 diabetes mellitus with diabetic chronic kidney disease: Secondary | ICD-10-CM | POA: Insufficient documentation

## 2016-03-06 DIAGNOSIS — Z79899 Other long term (current) drug therapy: Secondary | ICD-10-CM | POA: Insufficient documentation

## 2016-03-06 DIAGNOSIS — G8929 Other chronic pain: Secondary | ICD-10-CM | POA: Insufficient documentation

## 2016-03-06 DIAGNOSIS — I130 Hypertensive heart and chronic kidney disease with heart failure and stage 1 through stage 4 chronic kidney disease, or unspecified chronic kidney disease: Secondary | ICD-10-CM | POA: Insufficient documentation

## 2016-03-06 DIAGNOSIS — J449 Chronic obstructive pulmonary disease, unspecified: Secondary | ICD-10-CM | POA: Insufficient documentation

## 2016-03-06 DIAGNOSIS — G4733 Obstructive sleep apnea (adult) (pediatric): Secondary | ICD-10-CM | POA: Insufficient documentation

## 2016-03-06 DIAGNOSIS — K922 Gastrointestinal hemorrhage, unspecified: Secondary | ICD-10-CM | POA: Insufficient documentation

## 2016-03-06 DIAGNOSIS — R109 Unspecified abdominal pain: Secondary | ICD-10-CM | POA: Insufficient documentation

## 2016-03-06 DIAGNOSIS — G4700 Insomnia, unspecified: Secondary | ICD-10-CM | POA: Insufficient documentation

## 2016-03-06 DIAGNOSIS — D500 Iron deficiency anemia secondary to blood loss (chronic): Secondary | ICD-10-CM | POA: Insufficient documentation

## 2016-03-06 DIAGNOSIS — N189 Chronic kidney disease, unspecified: Secondary | ICD-10-CM | POA: Insufficient documentation

## 2016-03-06 DIAGNOSIS — Z7984 Long term (current) use of oral hypoglycemic drugs: Secondary | ICD-10-CM | POA: Insufficient documentation

## 2016-03-12 ENCOUNTER — Emergency Department: Payer: Medicare Other

## 2016-03-12 ENCOUNTER — Encounter: Payer: Self-pay | Admitting: Emergency Medicine

## 2016-03-12 ENCOUNTER — Emergency Department
Admission: EM | Admit: 2016-03-12 | Discharge: 2016-03-12 | Disposition: A | Discharge status: Home / Self Care | Payer: Medicare Other | Attending: Emergency Medicine | Admitting: Emergency Medicine

## 2016-03-12 DIAGNOSIS — W2209XA Striking against other stationary object, initial encounter: Secondary | ICD-10-CM | POA: Insufficient documentation

## 2016-03-12 DIAGNOSIS — I5032 Chronic diastolic (congestive) heart failure: Secondary | ICD-10-CM | POA: Diagnosis not present

## 2016-03-12 DIAGNOSIS — E039 Hypothyroidism, unspecified: Secondary | ICD-10-CM | POA: Diagnosis not present

## 2016-03-12 DIAGNOSIS — Z853 Personal history of malignant neoplasm of breast: Secondary | ICD-10-CM | POA: Diagnosis not present

## 2016-03-12 DIAGNOSIS — M25562 Pain in left knee: Secondary | ICD-10-CM | POA: Insufficient documentation

## 2016-03-12 DIAGNOSIS — E119 Type 2 diabetes mellitus without complications: Secondary | ICD-10-CM | POA: Diagnosis not present

## 2016-03-12 DIAGNOSIS — J45909 Unspecified asthma, uncomplicated: Secondary | ICD-10-CM | POA: Insufficient documentation

## 2016-03-12 DIAGNOSIS — Z7984 Long term (current) use of oral hypoglycemic drugs: Secondary | ICD-10-CM | POA: Insufficient documentation

## 2016-03-12 DIAGNOSIS — Y9355 Activity, bike riding: Secondary | ICD-10-CM | POA: Diagnosis not present

## 2016-03-12 DIAGNOSIS — Y999 Unspecified external cause status: Secondary | ICD-10-CM | POA: Diagnosis not present

## 2016-03-12 DIAGNOSIS — Z87891 Personal history of nicotine dependence: Secondary | ICD-10-CM | POA: Diagnosis not present

## 2016-03-12 DIAGNOSIS — Z79899 Other long term (current) drug therapy: Secondary | ICD-10-CM | POA: Diagnosis not present

## 2016-03-12 DIAGNOSIS — Y92410 Unspecified street and highway as the place of occurrence of the external cause: Secondary | ICD-10-CM | POA: Insufficient documentation

## 2016-03-12 DIAGNOSIS — J449 Chronic obstructive pulmonary disease, unspecified: Secondary | ICD-10-CM | POA: Insufficient documentation

## 2016-03-12 DIAGNOSIS — S8992XA Unspecified injury of left lower leg, initial encounter: Secondary | ICD-10-CM | POA: Diagnosis present

## 2016-03-12 NOTE — ED Notes (Signed)
States hit L knee while on scooter at [employer] in check out line. States the guardrail in the check out line was broken and stuck out more and "jabbed me in the knee cap." she states pain to L knee cap, "feels like it's swollen." States hurts more upon bearing weight on L knee.

## 2016-03-12 NOTE — ED Provider Notes (Signed)
[facility] Emergency Department Provider Note  ____________________________________________  Time seen: Approximately 8:02 PM  I have reviewed the triage vital signs and the nursing notes.   HISTORY  Chief Complaint Knee Injury    HPI Theresa Leon is a 61 y.o. female presenting to the emergency department with 7/10 anterior non-radiating knee pain after striking her left knee accidentally against a counter at [employer] while riding in a scooter. She states that her knee "just didn't feel right" after the incident. She has a history of chronic knee pain. She has noticed a small scratch of the skin overlying the left knee but no bruising. She denies history of DVT, PE, smoking, recent surgery and prolonged immobilization. Patient denies chest tightness, chest pain, shortness of breath, nausea and vomiting. No alleviating measures have been attempted. Tetanus status is updated.    Past Medical History:  Diagnosis Date  . Acute on chronic renal failure ([place])   . Anemia   . Asthma 1975  . COPD (chronic obstructive pulmonary disease) ([place])   . Diabetes ([place])   . Diabetes mellitus without complication ([place]) 0037  . Diastolic CHF, acute on chronic (HCC)   . H/O aortic valve replacement   . Heart disease   . Hypothyroid   . Morbid obesity ([place])   . 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 OSA (obstructive sleep apnea)     Patient Active Problem List   Diagnosis Date Noted  . V tach ([place]) 09/18/2015  . Iron deficiency anemia due to chronic blood loss 05/03/2015  . Gastritis due to nonsteroidal anti-inflammatory drug (NSAID) 03/05/2015  . History of esophagogastroduodenoscopy (EGD) 03/05/2015  . Type 2 diabetes mellitus ([place]) 03/01/2015  . GERD (gastroesophageal reflux disease) 03/01/2015  . HLD (hyperlipidemia) 03/01/2015  . COPD (chronic obstructive pulmonary disease) ([place]) 03/01/2015  . Chronic diastolic CHF (congestive  heart failure) ([place]) 03/01/2015  . Hypothyroidism 03/01/2015  . OSA on CPAP 03/01/2015  . Acute posthemorrhagic anemia 03/01/2015  . Dysphagia   . CHF (congestive heart failure) ([place])   . Acute respiratory failure with hypoxia ([place]) 01/26/2014  . Encounter for imaging study to confirm orogastric (OG) tube placement   . Respiratory failure ([place])   . 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 aortic valve relaced   2008  . BACK SURGERY     AS CHILD  . BREAST SURGERY  September 25, 2011   intraductal papilloma of the left breast  . CHOLECYSTECTOMY  2007  . COLONOSCOPY WITH PROPOFOL N/A 08/04/2014   Procedure: COLONOSCOPY WITH PROPOFOL;  Surgeon: Manya Silvas, MD;  Location: [facility] ENDOSCOPY;  Service: Endoscopy;  Laterality: N/A;  . ESOPHAGOGASTRODUODENOSCOPY Left 03/03/2015   Procedure: ESOPHAGOGASTRODUODENOSCOPY (EGD);  Surgeon: Manya Silvas, MD;  Location: [facility] ENDOSCOPY;  Service: Endoscopy;  Laterality: Left;  . ESOPHAGOGASTRODUODENOSCOPY (EGD) WITH PROPOFOL  08/04/2014   Procedure: ESOPHAGOGASTRODUODENOSCOPY (EGD) WITH PROPOFOL;  Surgeon: Manya Silvas, MD;  Location: [facility] ENDOSCOPY;  Service: Endoscopy;;  . PHRENIC NERVE PACEMAKER IMPLANTATION  2009  . TRACHEOSTOMY TUBE PLACEMENT N/A 02/04/2014   Procedure: TRACHEOSTOMY;  Surgeon: Rozetta Nunnery, MD;  Location: [facility];  Service: ENT;  Laterality: N/A;    Prior to Admission medications   Medication Sig Start Date End Date Taking? Authorizing Provider  acetaminophen (TYLENOL) 500 MG tablet Take 1,000 mg by mouth every 6 (six) hours as needed for mild  pain or headache.    Historical Provider, MD  albuterol (PROVENTIL HFA;VENTOLIN HFA) 108 (90 BASE) MCG/ACT inhaler Inhale 2 puffs into the lungs every 6 (six) hours as needed for wheezing or shortness of breath.     Historical Provider, MD  atorvastatin (LIPITOR) 10 MG tablet Take 10 mg by mouth at bedtime.    Historical  Provider, MD  Calcium Carbonate-Vitamin D (CALCIUM 600+D) 600-400 MG-UNIT tablet Take 1 tablet by mouth at bedtime.    Historical Provider, MD  cholecalciferol (VITAMIN D) 1000 units tablet Take 1,000 Units by mouth daily.    Historical Provider, MD  citalopram (CELEXA) 40 MG tablet Take 40 mg by mouth daily.    Historical Provider, MD  EPINEPHrine (EPIPEN 2-PAK) 0.3 mg/0.3 mL IJ SOAJ injection Inject 0.3 mg into the muscle once as needed (for severe allergic reaction).    Historical Provider, MD  fluticasone (FLONASE) 50 MCG/ACT nasal spray Place 2 sprays into both nostrils daily as needed for rhinitis.     Historical Provider, MD  gabapentin (NEURONTIN) 600 MG tablet Take 600 mg by mouth 4 (four) times daily.     Historical Provider, MD  levothyroxine (SYNTHROID, LEVOTHROID) 100 MCG tablet Take 100 mcg by mouth daily before breakfast.     Historical Provider, MD  loratadine (CLARITIN) 10 MG tablet Take 10 mg by mouth daily.    Historical Provider, MD  metFORMIN (GLUCOPHAGE-XR) 500 MG 24 hr tablet Take 500 mg by mouth daily with breakfast.    Historical Provider, MD  montelukast (SINGULAIR) 10 MG tablet Take 10 mg by mouth at bedtime.    Historical Provider, MD  Oxycodone HCl 10 MG TABS Take 10 mg by mouth 3 (three) times daily as needed for severe pain.     Historical Provider, MD  pantoprazole (PROTONIX) 40 MG tablet Take 1 tablet (40 mg total) by mouth 2 (two) times daily. Switch for any other PPI at similar dose and frequency 03/05/15   Theodoro Grist, MD  sucralfate (CARAFATE) 1 g tablet Take 1 tablet (1 g total) by mouth 4 (four) times daily -  with meals and at bedtime. 03/05/15   Theodoro Grist, MD  traZODone (DESYREL) 50 MG tablet Take 50 mg by mouth at bedtime.     Historical Provider, MD    Allergies Furosemide; Mold extract [trichophyton]; Tetanus toxoids; Qvar [beclomethasone]; Toprol xl [metoprolol tartrate]; Hctz [hydrochlorothiazide]; Nickel; Nitroglycerin; and Other  Family History   Problem Relation Age of Onset  . Skin telangiectasia Mother   . Lung cancer Maternal Grandmother   . Breast cancer Maternal Grandmother   . Lung cancer Paternal Grandmother     Social History Social History  Substance Use Topics  . Smoking status: Former Research scientist (life sciences)  . Smokeless tobacco: Never Used  . Alcohol use No     Review of Systems  Constitutional: No fever/chills. Cardiovascular: no chest pain. Respiratory: no cough. No SOB. Musculoskeletal: Patient has left knee pain.  Skin: Patient has a small scratch of the skin overlying the left knee.  Neurological: Negative for headaches, focal weakness or numbness. ____________________________________________   PHYSICAL EXAM:  VITAL SIGNS: ED Triage Vitals  Enc Vitals Group     BP 03/12/16 1731 136/78     Pulse Rate 03/12/16 1731 61     Resp 03/12/16 1731 18     Temp 03/12/16 1731 98.1 F (36.7 C)     Temp Source 03/12/16 1731 Oral     SpO2 03/12/16 1731 96 %  Weight 03/12/16 1729 (!) 350 lb (158.8 kg)     Height 03/12/16 1729 5' 7"  (1.702 m)     Head Circumference --      Peak Flow --      Pain Score 03/12/16 1730 7     Pain Loc --      Pain Edu? --      Excl. in [place]? --      Constitutional: Alert and oriented. Well appearing and in no acute distress. Eyes: Conjunctivae are normal. PERRL. EOMI. Head: Atraumatic. Cardiovascular: Normal rate, regular rhythm. Normal S1 and S2.  Good peripheral circulation. Respiratory: Normal respiratory effort without tachypnea or retractions. Lungs CTAB. Good air entry to the bases with no decreased or absent breath sounds. Musculoskeletal: To inspection, knees appear symmetric. Patient has 5 out of 5 strength in the lower extremities bilaterally. Left Knee: Peripatellar dimpling visualized. Negative anterior and posterior drawer test. Negative McMurray's. Negative ballottement. Palpable dorsalis pedis pulse bilaterally and symmetrically. Neurologic:  Normal speech and language. No  gross focal neurologic deficits are appreciated. Reflexes are 2+ and symmetric in the lower extremities bilaterally. Skin:  No erythema or edema of the skin overlying the left knee. Patient has a small scratch. Psychiatric: Mood and affect are normal. Speech and behavior are normal. Patient exhibits appropriate insight and judgement.  ___________________________________________   LABS (all labs ordered are listed, but only abnormal results are displayed)  Labs Reviewed - No data to display ____________________________________________  EKG   ____________________________________________  RADIOLOGY Unk Pinto, personally viewed and evaluated these images (plain radiographs) as part of my medical decision making, as well as reviewing the written report by the radiologist.  Dg Knee Complete 4 Views Left  Result Date: 03/12/2016 CLINICAL DATA:  Struck left knee on checkout counter at [employer], with infrapatellar knee pain, radiating medially. Initial encounter. EXAM: LEFT KNEE - COMPLETE 4+ VIEW COMPARISON:  Bilateral knee radiograph performed 11/07/2009 FINDINGS: There is no evidence of fracture or dislocation. There is narrowing of the medial compartment, with associated slight cortical irregularity and sclerosis. Marginal osteophyte formation is noted at all 3 compartments, with prominent trochlear osteophyte formation. Tibial spine osteophytes are noted. No significant joint effusion is seen. The visualized soft tissues are normal in appearance. IMPRESSION: 1. No evidence of fracture or dislocation. 2. Mild tricompartmental osteoarthritis, most prominent at the medial compartment. Electronically Signed   By: Garald Balding M.D.   On: 03/12/2016 19:13    ____________________________________________    PROCEDURES  Procedure(s) performed:    Procedures    Medications - No data to display   ____________________________________________   INITIAL IMPRESSION / ASSESSMENT AND  PLAN / ED COURSE  Pertinent labs & imaging results that were available during my care of the patient were reviewed by me and considered in my medical decision making (see chart for details).  Review of the [place] CSRS was performed in accordance of the [place] prior to dispensing any controlled drugs.     Assessment and Plan: Left Knee Pain  Patient presents to the emergency department with left knee pain after accidentally striking her left knee against a counter at [employer]. DG left knee reveals no acute fractures or bony abnormalities. Vital signs are reassuring at this time. Patient states that she has a suitable supply of pain medication at home. All patient questions were answered. A referral was given to orthopedics, Dr. Marry Guan. Patient was advised to make an appointment if left knee pain persists. ____________________________________________  FINAL CLINICAL IMPRESSION(S) /  ED DIAGNOSES  Final diagnoses:  Acute pain of left knee      NEW MEDICATIONS STARTED DURING THIS VISIT:  Discharge Medication List as of 03/12/2016  8:15 PM          This chart was dictated using voice recognition software/Dragon. Despite best efforts to proofread, errors can occur which can change the meaning. Any change was purely unintentional.    Lannie Fields, PA-C 03/12/16 2022    Lisa Roca, MD 03/12/16 2248

## 2016-03-12 NOTE — ED Triage Notes (Signed)
Pt was at [place] [employer] and in scooter and hit left knee on counter. C/o pain to knee.

## 2016-03-12 NOTE — ED Triage Notes (Signed)
Hit left knee on side of counter at [facility] while riding in a handicap scooter.

## 2016-03-12 NOTE — ED Notes (Signed)
Pt returned from xray via wheelchair

## 2016-03-12 NOTE — ED Notes (Signed)
Pt taken to xray via wheelchair

## 2016-03-13 ENCOUNTER — Inpatient Hospital Stay: Payer: Medicare Other

## 2016-03-13 ENCOUNTER — Inpatient Hospital Stay (HOSPITAL_BASED_OUTPATIENT_CLINIC_OR_DEPARTMENT_OTHER): Payer: Medicare Other | Admitting: Internal Medicine

## 2016-03-13 DIAGNOSIS — E039 Hypothyroidism, unspecified: Secondary | ICD-10-CM

## 2016-03-13 DIAGNOSIS — G4733 Obstructive sleep apnea (adult) (pediatric): Secondary | ICD-10-CM

## 2016-03-13 DIAGNOSIS — G8929 Other chronic pain: Secondary | ICD-10-CM | POA: Diagnosis not present

## 2016-03-13 DIAGNOSIS — I130 Hypertensive heart and chronic kidney disease with heart failure and stage 1 through stage 4 chronic kidney disease, or unspecified chronic kidney disease: Secondary | ICD-10-CM | POA: Diagnosis not present

## 2016-03-13 DIAGNOSIS — Z7984 Long term (current) use of oral hypoglycemic drugs: Secondary | ICD-10-CM | POA: Diagnosis not present

## 2016-03-13 DIAGNOSIS — J449 Chronic obstructive pulmonary disease, unspecified: Secondary | ICD-10-CM | POA: Diagnosis not present

## 2016-03-13 DIAGNOSIS — D500 Iron deficiency anemia secondary to blood loss (chronic): Secondary | ICD-10-CM

## 2016-03-13 DIAGNOSIS — K922 Gastrointestinal hemorrhage, unspecified: Secondary | ICD-10-CM

## 2016-03-13 DIAGNOSIS — G4700 Insomnia, unspecified: Secondary | ICD-10-CM | POA: Diagnosis not present

## 2016-03-13 DIAGNOSIS — E1122 Type 2 diabetes mellitus with diabetic chronic kidney disease: Secondary | ICD-10-CM | POA: Diagnosis not present

## 2016-03-13 DIAGNOSIS — Z79899 Other long term (current) drug therapy: Secondary | ICD-10-CM

## 2016-03-13 DIAGNOSIS — R109 Unspecified abdominal pain: Secondary | ICD-10-CM

## 2016-03-13 DIAGNOSIS — N189 Chronic kidney disease, unspecified: Secondary | ICD-10-CM | POA: Diagnosis not present

## 2016-03-13 LAB — COMPREHENSIVE METABOLIC PANEL
ALT: 27 U/L (ref 14–54)
AST: 34 U/L (ref 15–41)
Albumin: 3.6 g/dL (ref 3.5–5.0)
Alkaline Phosphatase: 82 U/L (ref 38–126)
Anion gap: 6 (ref 5–15)
BUN: 17 mg/dL (ref 6–20)
CHLORIDE: 103 mmol/L (ref 101–111)
CO2: 29 mmol/L (ref 22–32)
CREATININE: 0.84 mg/dL (ref 0.44–1.00)
Calcium: 8.8 mg/dL — ABNORMAL LOW (ref 8.9–10.3)
Glucose, Bld: 195 mg/dL — ABNORMAL HIGH (ref 65–99)
POTASSIUM: 4 mmol/L (ref 3.5–5.1)
Sodium: 138 mmol/L (ref 135–145)
Total Bilirubin: 1.5 mg/dL — ABNORMAL HIGH (ref 0.3–1.2)
Total Protein: 7.2 g/dL (ref 6.5–8.1)

## 2016-03-13 LAB — FOLATE: Folate: 19 ng/mL (ref >5.9)

## 2016-03-13 LAB — VITAMIN B12: Vitamin B-12: 217 pg/mL (ref 180–914)

## 2016-03-13 LAB — IRON AND TIBC
IRON: 55 ug/dL (ref 28–170)
SATURATION RATIOS: 14 % (ref 10.4–31.8)
TIBC: 408 ug/dL (ref 250–450)
UIBC: 353 ug/dL

## 2016-03-13 LAB — CBC WITH DIFFERENTIAL/PLATELET
Basophils Absolute: 0.2 K/uL — ABNORMAL HIGH (ref 0–0.1)
Basophils Relative: 4 %
Eosinophils Absolute: 0.2 K/uL (ref 0–0.7)
Eosinophils Relative: 3 %
HCT: 38.3 % (ref 35.0–47.0)
Hemoglobin: 13 g/dL (ref 12.0–16.0)
Lymphocytes Relative: 28 %
Lymphs Abs: 1.4 K/uL (ref 1.0–3.6)
MCH: 29.4 pg (ref 26.0–34.0)
MCHC: 34 g/dL (ref 32.0–36.0)
MCV: 86.5 fL (ref 80.0–100.0)
Monocytes Absolute: 0.2 K/uL (ref 0.2–0.9)
Monocytes Relative: 5 %
Neutro Abs: 3 K/uL (ref 1.4–6.5)
Neutrophils Relative %: 60 %
Platelets: 103 K/uL — ABNORMAL LOW (ref 150–440)
RBC: 4.43 MIL/uL (ref 3.80–5.20)
RDW: 13.7 % (ref 11.5–14.5)
WBC: 5 K/uL (ref 3.6–11.0)

## 2016-03-13 LAB — LACTATE DEHYDROGENASE: LDH: 179 U/L (ref 98–192)

## 2016-03-13 LAB — FERRITIN: FERRITIN: 14 ng/mL (ref 11–307)

## 2016-03-13 NOTE — Assessment & Plan Note (Addendum)
#    IRON DEFICIENCY ANEMIA- Likely GI etiology [status post GI workup as above; no clear source]. S/p Ferrahem in oct 2017. Check CBC CMP B31 folic acid and iron studies ferritin today. We will plan IV iron based upon labs.  # Insomnia- ? Etiology ? Sec to metformin; defer to PCP.   # Thrombocytopenia- 90-100s- ? Etiology. 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 Cirrhosis/ no splenomegaly] Discussed re: prednisone [insomnia; weight gain; elevated Blood glucose]/ IVIG as a possibility if she plans to have gastric bypass; also discussed re: Bone marrow bx- hold off for now. She will call if gastric bypass surgery is planned in future.   # Recheck CBC ferritin and iron studies in 6 months- few days prior/possible ferriheme. Follow-up in 6 months.

## 2016-03-13 NOTE — Progress Notes (Signed)
Patient here for f/u of IDA. Patient has a medical office consult apt with Dr. Duke Salvia for gastric bypass consideration at the end of Feb.

## 2016-03-13 NOTE — Progress Notes (Signed)
[facility] NOTE  Patient Care Team: Ellamae Sia, MD as PCP - General (Internal Medicine) Robert Bellow, MD (General Surgery)  CHIEF COMPLAINTS/PURPOSE OF CONSULTATION:   # April 2017- IRON DEFICIENCY ANEMIA sec to chronic GI blood loss; [Hb-7/Feb 2017] Recom IV ferrahem  # Chronic GI blood loss- EDG/capsule [feb 2017]- gastrtitis ? NSAIDs/COLO-July 2016/ [Dr.Elliot]  # Bovine heart valve [not on anti-coagulation]  HISTORY OF PRESENTING ILLNESS:  Theresa Leon 61 y.o.  female morbidly obese multiple medical problems history of iron deficiency anemia likely GI blood loss/unclear source is here for follow-up.  Patient complains of insomnia- question related to increased dose of metformin. She continues to deny any blood in stools black stools. Chronic abdominal pain- question attributable to abdominal hernia. Not any worse. Denies any bleeding. She is awaiting evaluation with surgery for possible gastric bypass.  ROS: A complete 10 point review of system is done which is negative except mentioned above in history of present illness  MEDICAL HISTORY:  Past Medical History:  Diagnosis Date  . Acute on chronic renal failure ([place])   . Anemia   . Asthma 1975  . COPD (chronic obstructive pulmonary disease) ([place])   . Diabetes ([place])   . Diabetes mellitus without complication ([place]) 9450  . Diastolic CHF, acute on chronic (HCC)   . H/O aortic valve replacement   . Heart disease   . Hypothyroid   . Morbid obesity ([place])   . 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 OSA (obstructive sleep apnea)     SURGICAL HISTORY: Past Surgical History:  Procedure Laterality Date  . aortic valve relaced   2008  . BACK SURGERY     AS CHILD  . BREAST SURGERY  September 25, 2011   intraductal papilloma of the left breast  . CHOLECYSTECTOMY  2007  . COLONOSCOPY WITH PROPOFOL N/A 08/04/2014   Procedure: COLONOSCOPY WITH PROPOFOL;  Surgeon:  Manya Silvas, MD;  Location: [facility] ENDOSCOPY;  Service: Endoscopy;  Laterality: N/A;  . ESOPHAGOGASTRODUODENOSCOPY Left 03/03/2015   Procedure: ESOPHAGOGASTRODUODENOSCOPY (EGD);  Surgeon: Manya Silvas, MD;  Location: [facility] ENDOSCOPY;  Service: Endoscopy;  Laterality: Left;  . ESOPHAGOGASTRODUODENOSCOPY (EGD) WITH PROPOFOL  08/04/2014   Procedure: ESOPHAGOGASTRODUODENOSCOPY (EGD) WITH PROPOFOL;  Surgeon: Manya Silvas, MD;  Location: [facility] ENDOSCOPY;  Service: Endoscopy;;  . PHRENIC NERVE PACEMAKER IMPLANTATION  2009  . TRACHEOSTOMY TUBE PLACEMENT N/A 02/04/2014   Procedure: TRACHEOSTOMY;  Surgeon: Rozetta Nunnery, MD;  Location: [facility];  Service: ENT;  Laterality: N/A;    SOCIAL HISTORY: Social History   Social History  . Marital status: Widowed    Spouse name: N/A  . Number of children: N/A  . Years of education: N/A   Occupational History  . Not on file.   Social History Main Topics  . Smoking status: Former Research scientist (life sciences)  . Smokeless tobacco: Never Used  . Alcohol use No  . Drug use: No  . Sexual activity: Not on file   Other Topics Concern  . Not on file   Social History Narrative  . No narrative on file    FAMILY HISTORY: Family History  Problem Relation Age of Onset  . Skin telangiectasia Mother   . Lung cancer Maternal Grandmother   . Breast cancer Maternal Grandmother   . Lung cancer Paternal Grandmother     ALLERGIES:  is allergic to furosemide; mold extract [trichophyton]; tetanus toxoids; qvar [beclomethasone]; toprol  xl [metoprolol tartrate]; hctz [hydrochlorothiazide]; nickel; nitroglycerin; and other.  MEDICATIONS:  Current Outpatient Prescriptions  Medication Sig Dispense Refill  . acetaminophen (TYLENOL) 500 MG tablet Take 1,000 mg by mouth every 6 (six) hours as needed for mild pain or headache.    Marland Kitchen atorvastatin (LIPITOR) 10 MG tablet Take 10 mg by mouth daily at 6 PM.    . Calcium Carbonate-Vitamin D (CALCIUM 600+D) 600-400 MG-UNIT tablet Take 1  tablet by mouth at bedtime.    . cholecalciferol (VITAMIN D) 1000 units tablet Take 1,000 Units by mouth daily.    . citalopram (CELEXA) 40 MG tablet Take 40 mg by mouth daily.    . fluticasone (FLONASE) 50 MCG/ACT nasal spray Place 2 sprays into both nostrils daily as needed for rhinitis.     Marland Kitchen gabapentin (NEURONTIN) 600 MG tablet Take 600 mg by mouth 4 (four) times daily.     Marland Kitchen levothyroxine (SYNTHROID, LEVOTHROID) 100 MCG tablet Take 100 mcg by mouth daily before breakfast.     . loratadine (CLARITIN) 10 MG tablet Take 10 mg by mouth daily.    . metFORMIN (GLUCOPHAGE-XR) 500 MG 24 hr tablet Take 500 mg by mouth 2 (two) times daily.     . montelukast (SINGULAIR) 10 MG tablet Take 10 mg by mouth at bedtime.    . Oxycodone HCl 10 MG TABS Take 10 mg by mouth 3 (three) times daily as needed for severe pain.     Marland Kitchen sucralfate (CARAFATE) 1 g tablet Take 1 tablet (1 g total) by mouth 4 (four) times daily -  with meals and at bedtime. 90 tablet 6  . traZODone (DESYREL) 50 MG tablet Take 50 mg by mouth at bedtime.     Marland Kitchen albuterol (PROVENTIL HFA;VENTOLIN HFA) 108 (90 BASE) MCG/ACT inhaler Inhale 2 puffs into the lungs every 6 (six) hours as needed for wheezing or shortness of breath.     Marland Kitchen amoxicillin (AMOXIL) 500 MG capsule Take 1 capsule by mouth as needed for other. Takes prior to dental procedures    . EPINEPHrine (EPIPEN 2-PAK) 0.3 mg/0.3 mL IJ SOAJ injection Inject 0.3 mg into the muscle once as needed (for severe allergic reaction).     No current facility-administered medications for this visit.       Marland Kitchen  PHYSICAL EXAMINATION:   There were no vitals filed for this visit. There were no vitals filed for this visit.  GENERAL: Well-nourished well-developed; Alert, no distress and comfortable. Obese in a wheel chair.   EYES: positive for pallor OROPHARYNX: no thrush or ulceration. NECK: supple, no masses felt LYMPH:  no palpable lymphadenopathy in the cervical, axillary or inguinal  regions LUNGS: clear to auscultation and  No wheeze or crackles HEART/CVS: regular rate & rhythm and no murmurs; 1 plus bil lower extremity edema ABDOMEN: abdomen soft, non-tender and normal bowel sounds Musculoskeletal:no cyanosis of digits and no clubbing  PSYCH: alert & oriented x 3 with fluent speech NEURO: no focal motor/sensory deficits SKIN:  no rashes or significant lesions.   LABORATORY DATA:  I have reviewed the data as listed Lab Results  Component Value Date   WBC 5.0 03/13/2016   HGB 13.0 03/13/2016   HCT 38.3 03/13/2016   MCV 86.5 03/13/2016   PLT 103 (L) 03/13/2016    Recent Labs  06/15/15 2330 06/17/15 1830 09/18/15 1600 03/13/16 1452  NA 139 139 138 138  K 3.7 4.2 3.8 4.0  CL 103 104 103 103  CO2 31 29 31  29  GLUCOSE 175* 89 165* 195*  BUN 15 16 18 17   CREATININE 1.41* 0.97 1.09* 0.84  CALCIUM 9.0 8.8* 9.2 8.8*  GFRNONAA 40* >60 55* >60  GFRAA 47* >60 >60 >60  PROT 7.2  --  7.2 7.2  ALBUMIN 3.7  --  3.8 3.6  AST 21  --  26 34  ALT 17  --  20 27  ALKPHOS 71  --  86 82  BILITOT 1.4*  --  1.3* 1.5*     ASSESSMENT & PLAN:   Iron deficiency anemia due to chronic blood loss #  IRON DEFICIENCY ANEMIA- Likely GI etiology [status post GI workup as above; no clear source]. S/p Ferrahem in oct 2017. Check CBC CMP U83 folic acid and iron studies ferritin today. We will plan IV iron based upon labs.  # Insomnia- ? Etiology ? Sec to metformin; defer to PCP.   # Thrombocytopenia- 90-100s- ? Etiology. 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 Cirrhosis/ no splenomegaly] Discussed re: prednisone [insomnia; weight gain; elevated Blood glucose]/ IVIG as a possibility if she plans to have gastric bypass; also discussed re: Bone marrow bx- hold off for now. She will call if gastric bypass surgery is planned in future.   # Recheck CBC ferritin and iron studies in 6 months- few days prior/possible ferriheme. Follow-up in 6 months.     Cammie Sickle, MD 03/13/2016 4:23  PM

## 2016-03-14 ENCOUNTER — Telehealth: Payer: Self-pay | Admitting: *Deleted

## 2016-03-14 NOTE — Telephone Encounter (Signed)
Pt called the cancer center to review results. States that she missed a phone call from the cancer center a few mins. Ago.  Results provided to patient. Explained to pt that she needs IV iron treatment. Will set up for next week.  msg sent to sch. Team to arrange apt.

## 2016-03-14 NOTE — Telephone Encounter (Signed)
-----   Message from Cammie Sickle, MD sent at 03/13/2016  4:51 PM EST ----- Please inform pt that iron levels look slightly low; would recommend IV Feraheme to avoid any further drop in hemoglobin in future. Also have was inform us if she is going to have surgery- so that we can have her platelets treated. Otherwise follow up as planned.

## 2016-03-22 ENCOUNTER — Inpatient Hospital Stay: Payer: Medicare Other

## 2016-03-22 VITALS — BP 107/72 | HR 69 | Temp 98.2°F | Resp 20

## 2016-03-22 DIAGNOSIS — D500 Iron deficiency anemia secondary to blood loss (chronic): Secondary | ICD-10-CM

## 2016-03-22 MED ORDER — SODIUM CHLORIDE 0.9 % IV SOLN
Freq: Once | INTRAVENOUS | Status: AC
  Administered 2016-03-22: 14:00:00 via INTRAVENOUS
  Filled 2016-03-22: qty 1000

## 2016-03-22 MED ORDER — SODIUM CHLORIDE 0.9 % IV SOLN
510.0000 mg | Freq: Once | INTRAVENOUS | Status: AC
  Administered 2016-03-22: 510 mg via INTRAVENOUS
  Filled 2016-03-22: qty 17

## 2016-03-29 ENCOUNTER — Other Ambulatory Visit: Payer: Self-pay

## 2016-03-29 ENCOUNTER — Telehealth: Payer: Self-pay

## 2016-03-29 NOTE — Telephone Encounter (Signed)
Pt has had 2 EGD's prior. Rule-out for weight loss surgery.   Pt has cardiac history, pacemaker and is Type 2 Diabetic.

## 2016-04-03 ENCOUNTER — Other Ambulatory Visit: Payer: Self-pay | Admitting: Bariatrics

## 2016-04-09 ENCOUNTER — Ambulatory Visit: Payer: Medicare Other | Admitting: Anesthesiology

## 2016-04-09 ENCOUNTER — Encounter
Admission: RE | Disposition: A | Discharge status: Home / Self Care | Payer: Self-pay | Source: Ambulatory Visit | Attending: Gastroenterology

## 2016-04-09 ENCOUNTER — Ambulatory Visit
Admission: RE | Admit: 2016-04-09 | Discharge: 2016-04-09 | Disposition: A | Discharge status: Home / Self Care | Payer: Medicare Other | Source: Ambulatory Visit | Attending: Gastroenterology | Admitting: Gastroenterology

## 2016-04-09 ENCOUNTER — Encounter: Payer: Self-pay | Admitting: Gastroenterology

## 2016-04-09 DIAGNOSIS — I5032 Chronic diastolic (congestive) heart failure: Secondary | ICD-10-CM | POA: Diagnosis not present

## 2016-04-09 DIAGNOSIS — K2901 Acute gastritis with bleeding: Secondary | ICD-10-CM | POA: Insufficient documentation

## 2016-04-09 DIAGNOSIS — Z7984 Long term (current) use of oral hypoglycemic drugs: Secondary | ICD-10-CM | POA: Diagnosis not present

## 2016-04-09 DIAGNOSIS — J449 Chronic obstructive pulmonary disease, unspecified: Secondary | ICD-10-CM | POA: Diagnosis not present

## 2016-04-09 DIAGNOSIS — R12 Heartburn: Secondary | ICD-10-CM | POA: Insufficient documentation

## 2016-04-09 DIAGNOSIS — K2980 Duodenitis without bleeding: Secondary | ICD-10-CM | POA: Diagnosis not present

## 2016-04-09 DIAGNOSIS — Z6841 Body Mass Index (BMI) 40.0 and over, adult: Secondary | ICD-10-CM | POA: Insufficient documentation

## 2016-04-09 DIAGNOSIS — Z952 Presence of prosthetic heart valve: Secondary | ICD-10-CM | POA: Diagnosis not present

## 2016-04-09 DIAGNOSIS — N189 Chronic kidney disease, unspecified: Secondary | ICD-10-CM | POA: Diagnosis not present

## 2016-04-09 DIAGNOSIS — E1122 Type 2 diabetes mellitus with diabetic chronic kidney disease: Secondary | ICD-10-CM | POA: Insufficient documentation

## 2016-04-09 DIAGNOSIS — E039 Hypothyroidism, unspecified: Secondary | ICD-10-CM | POA: Insufficient documentation

## 2016-04-09 DIAGNOSIS — Z79899 Other long term (current) drug therapy: Secondary | ICD-10-CM | POA: Diagnosis not present

## 2016-04-09 HISTORY — PX: ESOPHAGOGASTRODUODENOSCOPY (EGD) WITH PROPOFOL: SHX5813

## 2016-04-09 LAB — GLUCOSE, CAPILLARY: Glucose-Capillary: 145 mg/dL — ABNORMAL HIGH (ref 65–99)

## 2016-04-09 SURGERY — ESOPHAGOGASTRODUODENOSCOPY (EGD) WITH PROPOFOL
Anesthesia: General

## 2016-04-09 MED ORDER — LIDOCAINE HCL (CARDIAC) 20 MG/ML IV SOLN
INTRAVENOUS | Status: DC | PRN
  Administered 2016-04-09: 60 mg via INTRATRACHEAL

## 2016-04-09 MED ORDER — PROPOFOL 10 MG/ML IV BOLUS
INTRAVENOUS | Status: DC | PRN
  Administered 2016-04-09: 50 mg via INTRAVENOUS

## 2016-04-09 MED ORDER — FENTANYL CITRATE (PF) 100 MCG/2ML IJ SOLN
INTRAMUSCULAR | Status: AC
  Filled 2016-04-09: qty 2

## 2016-04-09 MED ORDER — MIDAZOLAM HCL 2 MG/2ML IJ SOLN
INTRAMUSCULAR | Status: AC
  Filled 2016-04-09: qty 2

## 2016-04-09 MED ORDER — PROPOFOL 10 MG/ML IV BOLUS
INTRAVENOUS | Status: AC
  Filled 2016-04-09: qty 20

## 2016-04-09 MED ORDER — MIDAZOLAM HCL 2 MG/2ML IJ SOLN
INTRAMUSCULAR | Status: DC | PRN
  Administered 2016-04-09: 2 mg via INTRAVENOUS

## 2016-04-09 MED ORDER — SODIUM CHLORIDE 0.9 % IV SOLN
INTRAVENOUS | Status: DC
Start: 2016-04-09 — End: 2016-04-09
  Administered 2016-04-09: 10:00:00 via INTRAVENOUS
  Administered 2016-04-09: 1000 mL via INTRAVENOUS

## 2016-04-09 NOTE — Anesthesia Procedure Notes (Signed)
Date/Time: 04/09/2016 9:45 AM Performed by: Allean Found Pre-anesthesia Checklist: Patient identified, Emergency Drugs available, Suction available, Patient being monitored and Timeout performed Patient Re-evaluated:Patient Re-evaluated prior to inductionOxygen Delivery Method: Nasal cannula Placement Confirmation: CO2 detector

## 2016-04-09 NOTE — Anesthesia Post-op Follow-up Note (Cosign Needed)
Anesthesia QCDR form completed.        

## 2016-04-09 NOTE — Transfer of Care (Signed)
Immediate Anesthesia Transfer of Care Note  Patient: Theresa Leon  Procedure(s) Performed: Procedure(s): ESOPHAGOGASTRODUODENOSCOPY (EGD) WITH PROPOFOL (N/A)  Patient Location: PACU  Anesthesia Type:General  Level of Consciousness: awake  Airway & Oxygen Therapy: Patient Spontanous Breathing and Patient connected to nasal cannula oxygen  Post-op Assessment: Report given to RN and Post -op Vital signs reviewed and stable  Post vital signs: Reviewed and stable  Last Vitals:  Vitals:   04/09/16 0856  BP: 122/62  Pulse: 61  Resp: (!) 22  Temp: 37.2 C    Last Pain:  Vitals:   04/09/16 0856  TempSrc: Tympanic  PainSc: 3          Complications: No apparent anesthesia complications

## 2016-04-09 NOTE — H&P (Signed)
Lucilla Lame, MD [address] [place], [place] [address] Phone: [phone] Fax : [phone]  Primary Care Physician:  Ellamae Sia, MD Primary Gastroenterologist:  Dr. Allen Norris  Pre-Procedure History & Physical: HPI:  Theresa Leon is a 61 y.o. female is here for an endoscopy.   Past Medical History:  Diagnosis Date  . Acute on chronic renal failure ([place])   . Anemia   . Asthma 1975  . COPD (chronic obstructive pulmonary disease) ([place])   . Diabetes ([place])   . Diabetes mellitus without complication ([place]) 3016  . Diastolic CHF, acute on chronic (HCC)   . H/O aortic valve replacement   . Heart disease   . Hypothyroid   . Morbid obesity ([place])   . 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 OSA (obstructive sleep apnea)     Past Surgical History:  Procedure Laterality Date  . aortic valve relaced   2008  . BACK SURGERY     AS CHILD  . BREAST SURGERY  September 25, 2011   intraductal papilloma of the left breast  . CHOLECYSTECTOMY  2007  . COLONOSCOPY WITH PROPOFOL N/A 08/04/2014   Procedure: COLONOSCOPY WITH PROPOFOL;  Surgeon: Manya Silvas, MD;  Location: [facility] ENDOSCOPY;  Service: Endoscopy;  Laterality: N/A;  . ESOPHAGOGASTRODUODENOSCOPY Left 03/03/2015   Procedure: ESOPHAGOGASTRODUODENOSCOPY (EGD);  Surgeon: Manya Silvas, MD;  Location: [facility] ENDOSCOPY;  Service: Endoscopy;  Laterality: Left;  . ESOPHAGOGASTRODUODENOSCOPY (EGD) WITH PROPOFOL  08/04/2014   Procedure: ESOPHAGOGASTRODUODENOSCOPY (EGD) WITH PROPOFOL;  Surgeon: Manya Silvas, MD;  Location: [facility] ENDOSCOPY;  Service: Endoscopy;;  . PHRENIC NERVE PACEMAKER IMPLANTATION  2009  . TRACHEOSTOMY TUBE PLACEMENT N/A 02/04/2014   Procedure: TRACHEOSTOMY;  Surgeon: Rozetta Nunnery, MD;  Location: [place];  Service: ENT;  Laterality: N/A;    Prior to Admission medications   Medication Sig Start Date End Date Taking? Authorizing Provider  acetaminophen (TYLENOL) 500 MG  tablet Take 1,000 mg by mouth every 6 (six) hours as needed for mild pain or headache.   Yes Historical Provider, MD  albuterol (PROVENTIL HFA;VENTOLIN HFA) 108 (90 BASE) MCG/ACT inhaler Inhale 2 puffs into the lungs every 6 (six) hours as needed for wheezing or shortness of breath.    Yes Historical Provider, MD  atorvastatin (LIPITOR) 10 MG tablet Take 10 mg by mouth daily at 6 PM.   Yes Historical Provider, MD  Calcium Carbonate-Vitamin D (CALCIUM 600+D) 600-400 MG-UNIT tablet Take 1 tablet by mouth at bedtime.   Yes Historical Provider, MD  cholecalciferol (VITAMIN D) 1000 units tablet Take 1,000 Units by mouth daily.   Yes Historical Provider, MD  citalopram (CELEXA) 40 MG tablet Take 40 mg by mouth daily.   Yes Historical Provider, MD  EPINEPHrine (EPIPEN 2-PAK) 0.3 mg/0.3 mL IJ SOAJ injection Inject 0.3 mg into the muscle once as needed (for severe allergic reaction).   Yes Historical Provider, MD  fluticasone (FLONASE) 50 MCG/ACT nasal spray Place 2 sprays into both nostrils daily as needed for rhinitis.    Yes Historical Provider, MD  gabapentin (NEURONTIN) 600 MG tablet Take 600 mg by mouth 4 (four) times daily.    Yes Historical Provider, MD  levothyroxine (SYNTHROID, LEVOTHROID) 100 MCG tablet Take 100 mcg by mouth daily before breakfast.    Yes Historical Provider, MD  loratadine (CLARITIN) 10 MG tablet Take 10 mg by mouth daily.   Yes Historical Provider, MD  metFORMIN (GLUCOPHAGE-XR) 500  MG 24 hr tablet Take 500 mg by mouth 2 (two) times daily.    Yes Historical Provider, MD  montelukast (SINGULAIR) 10 MG tablet Take 10 mg by mouth at bedtime.   Yes Historical Provider, MD  Oxycodone HCl 10 MG TABS Take 10 mg by mouth 3 (three) times daily as needed for severe pain.    Yes Historical Provider, MD  sucralfate (CARAFATE) 1 g tablet Take 1 tablet (1 g total) by mouth 4 (four) times daily -  with meals and at bedtime. 03/05/15  Yes Theodoro Grist, MD  traZODone (DESYREL) 50 MG tablet Take 50  mg by mouth at bedtime.    Yes Historical Provider, MD  amoxicillin (AMOXIL) 500 MG capsule Take 1 capsule by mouth as needed for other. Takes prior to dental procedures 03/07/16   Historical Provider, MD    Allergies as of 03/29/2016 - Review Complete 03/12/2016  Allergen Reaction Noted  . Furosemide Other (See Comments) 08/04/2014  . Mold extract [trichophyton] Anaphylaxis 03/28/2012  . Tetanus toxoids Other (See Comments) 08/04/2014  . Qvar [beclomethasone] Other (See Comments) 08/04/2014  . Toprol xl [metoprolol tartrate] Hives 08/04/2014  . Hctz [hydrochlorothiazide] Other (See Comments) 04/22/2015  . Nickel  09/18/2015  . Nitroglycerin Other (See Comments) 07/27/2012  . Other Hives, Swelling, and Other (See Comments) 03/01/2015    Family History  Problem Relation Age of Onset  . Skin telangiectasia Mother   . Lung cancer Maternal Grandmother   . Breast cancer Maternal Grandmother   . Lung cancer Paternal Grandmother     Social History   Social History  . Marital status: Widowed    Spouse name: N/A  . Number of children: N/A  . Years of education: N/A   Occupational History  . Not on file.   Social History Main Topics  . Smoking status: Former Research scientist (life sciences)  . Smokeless tobacco: Never Used  . Alcohol use No  . Drug use: No  . Sexual activity: Not on file   Other Topics Concern  . Not on file   Social History Narrative  . No narrative on file    Review of Systems: See HPI, otherwise negative ROS  Physical Exam: BP 122/62   Pulse 61   Temp 99 F (37.2 C) (Tympanic)   Resp (!) 22   Ht 5' 7"  (1.702 m)   Wt (!) 342 lb (155.1 kg)   SpO2 92%   BMI 53.56 kg/m  General:   Alert,  pleasant and cooperative in NAD Head:  Normocephalic and atraumatic. Neck:  Supple; no masses or thyromegaly. Lungs:  Clear throughout to auscultation.    Heart:  Regular rate and rhythm. Abdomen:  Soft, nontender and nondistended. Normal bowel sounds, without guarding, and without  rebound.   Neurologic:  Alert and  oriented x4;  grossly normal neurologically.  Impression/Plan: Theresa Leon is here for an endoscopy to be performed for gastric bypass clearance.  Risks, benefits, limitations, and alternatives regarding  endoscopy have been reviewed with the patient.  Questions have been answered.  All parties agreeable.   Lucilla Lame, MD  04/09/2016, 9:36 AM

## 2016-04-09 NOTE — Anesthesia Preprocedure Evaluation (Signed)
Anesthesia Evaluation  Patient identified by MRN, date of birth, ID band Patient awake    Reviewed: Allergy & Precautions, NPO status , Patient's Chart, lab work & pertinent test results  History of Anesthesia Complications Negative for: history of anesthetic complications  Airway Mallampati: II  TM Distance: >3 FB Neck ROM: Full    Dental  (+) Missing, Poor Dentition   Pulmonary asthma , sleep apnea and Continuous Positive Airway Pressure Ventilation , COPD,  COPD inhaler, former smoker,    breath sounds clear to auscultation- rhonchi (-) wheezing      Cardiovascular (-) hypertension+CHF  + pacemaker  Rhythm:Regular Rate:Normal - Systolic murmurs and - Diastolic murmurs Echo 1/42/39: - Procedure narrative: Transthoracic echocardiography. Image   quality was suboptimal. The study was technically difficult. - Left ventricle: The cavity size was normal. Systolic function was   normal. The estimated ejection fraction was 60%. Wall motion was   normal; there were no regional wall motion abnormalities. - Aortic valve: Valve area (VTI): 2.08 cm^2. Valve area (Vmax):   1.69 cm^2. Valve area (Vmean): 1.42 cm^2. - Left atrium: The atrium was mildly dilated. - Right ventricle: The cavity size was mildly dilated. - Atrial septum: No defect or patent foramen ovale was identified.   Neuro/Psych negative neurological ROS  negative psych ROS   GI/Hepatic GERD  ,Fatty liver   Endo/Other  diabetes, Oral Hypoglycemic AgentsHypothyroidism   Renal/GU Renal InsufficiencyRenal disease     Musculoskeletal   Abdominal (+) + obese,   Peds  Hematology  (+) anemia ,   Anesthesia Other Findings   Reproductive/Obstetrics                             Anesthesia Physical Anesthesia Plan  ASA: III  Anesthesia Plan: General   Post-op Pain Management:    Induction: Intravenous  Airway Management Planned:  Natural Airway  Additional Equipment:   Intra-op Plan:   Post-operative Plan:   Informed Consent: I have reviewed the patients History and Physical, chart, labs and discussed the procedure including the risks, benefits and alternatives for the proposed anesthesia with the patient or authorized representative who has indicated his/her understanding and acceptance.   Dental advisory given  Plan Discussed with: CRNA and Anesthesiologist  Anesthesia Plan Comments:         Anesthesia Quick Evaluation

## 2016-04-09 NOTE — Anesthesia Postprocedure Evaluation (Signed)
Anesthesia Post Note  Patient: ARABELA BASALDUA  Procedure(s) Performed: Procedure(s) (LRB): ESOPHAGOGASTRODUODENOSCOPY (EGD) WITH PROPOFOL (N/A)  Patient location during evaluation: Endoscopy Anesthesia Type: General Level of consciousness: awake and alert and oriented Pain management: pain level controlled Vital Signs Assessment: post-procedure vital signs reviewed and stable Respiratory status: spontaneous breathing, nonlabored ventilation and respiratory function stable Cardiovascular status: blood pressure returned to baseline and stable Postop Assessment: no signs of nausea or vomiting Anesthetic complications: no     Last Vitals:  Vitals:   04/09/16 0856 04/09/16 1003  BP: 122/62   Pulse: 61   Resp: (!) 22   Temp: 37.2 C 36.6 C    Last Pain:  Vitals:   04/09/16 1003  TempSrc: Tympanic  PainSc:                  Ceci Taliaferro

## 2016-04-09 NOTE — Op Note (Signed)
[facility] Gastroenterology Patient Name: Theresa Leon Procedure Date: 04/09/2016 9:27 AM MRN: [id number] Account #: [id number] Date of Birth: 11-Feb-1955 Admit Type: Outpatient Age: 61 Room: [facility] ENDO ROOM 4 Gender: Female Note Status: Finalized Procedure:            Upper GI endoscopy Indications:          Heartburn, Suspected gastro-esophageal reflux disease Providers:            Lucilla Lame MD, MD Referring MD:         Remus Blake MD, MD (Referring MD) Medicines:            Propofol per Anesthesia Complications:        No immediate complications. Procedure:            Pre-Anesthesia Assessment:                       - Prior to the procedure, a History and Physical was                        performed, and patient medications and allergies were                        reviewed. The patient's tolerance of previous                        anesthesia was also reviewed. The risks and benefits of                        the procedure and the sedation options and risks were                        discussed with the patient. All questions were                        answered, and informed consent was obtained. Prior                        Anticoagulants: The patient has taken no previous                        anticoagulant or antiplatelet agents. ASA Grade                        Assessment: II - A patient with mild systemic disease.                        After reviewing the risks and benefits, the patient was                        deemed in satisfactory condition to undergo the                        procedure.                       After obtaining informed consent, the endoscope was                        passed under direct vision. Throughout the procedure,  the patient's blood pressure, pulse, and oxygen                        saturations were monitored continuously. The Endoscope                        was introduced through the mouth,  and advanced to the                        second part of duodenum. The upper GI endoscopy was                        accomplished without difficulty. The patient tolerated                        the procedure well. Findings:      The examined esophagus was normal.      Diffuse severe inflammation characterized by erosions was found in the       gastric antrum. Biopsies were taken with a cold forceps for histology.      Localized mild inflammation was found in the duodenal bulb. Impression:           - Normal esophagus.                       - Gastritis. Biopsied.                       - Duodenitis.                       - No bile seen in the stomach. Recommendation:       - Await pathology results.                       - Discharge patient to home.                       - Resume previous diet.                       - Continue present medications. Procedure Code(s):    --- Professional ---                       [phone], Esophagogastroduodenoscopy, flexible, transoral;                        with biopsy, single or multiple Diagnosis Code(s):    --- Professional ---                       R12, Heartburn                       K29.80, Duodenitis without bleeding                       K29.70, Gastritis, unspecified, without bleeding CPT copyright 2016 American Medical Association. All rights reserved. The codes documented in this report are preliminary and upon coder review may  be revised to meet current compliance requirements. Lucilla Lame MD, MD 04/09/2016 9:54:11 AM This report has been signed electronically. Number of Addenda: 0 Note Initiated On: 04/09/2016 9:27 AM  [facility]

## 2016-04-10 LAB — SURGICAL PATHOLOGY

## 2016-04-11 ENCOUNTER — Encounter: Payer: Self-pay | Admitting: Gastroenterology

## 2016-04-15 IMAGING — CR DG CHEST 1V PORT
1 series · 1 of 1 positions shown · non-contrast
Comparison: 09/28/2013

CLINICAL DATA: Chest pain for several hours

EXAM:
PORTABLE CHEST - 1 VIEW

[ap]
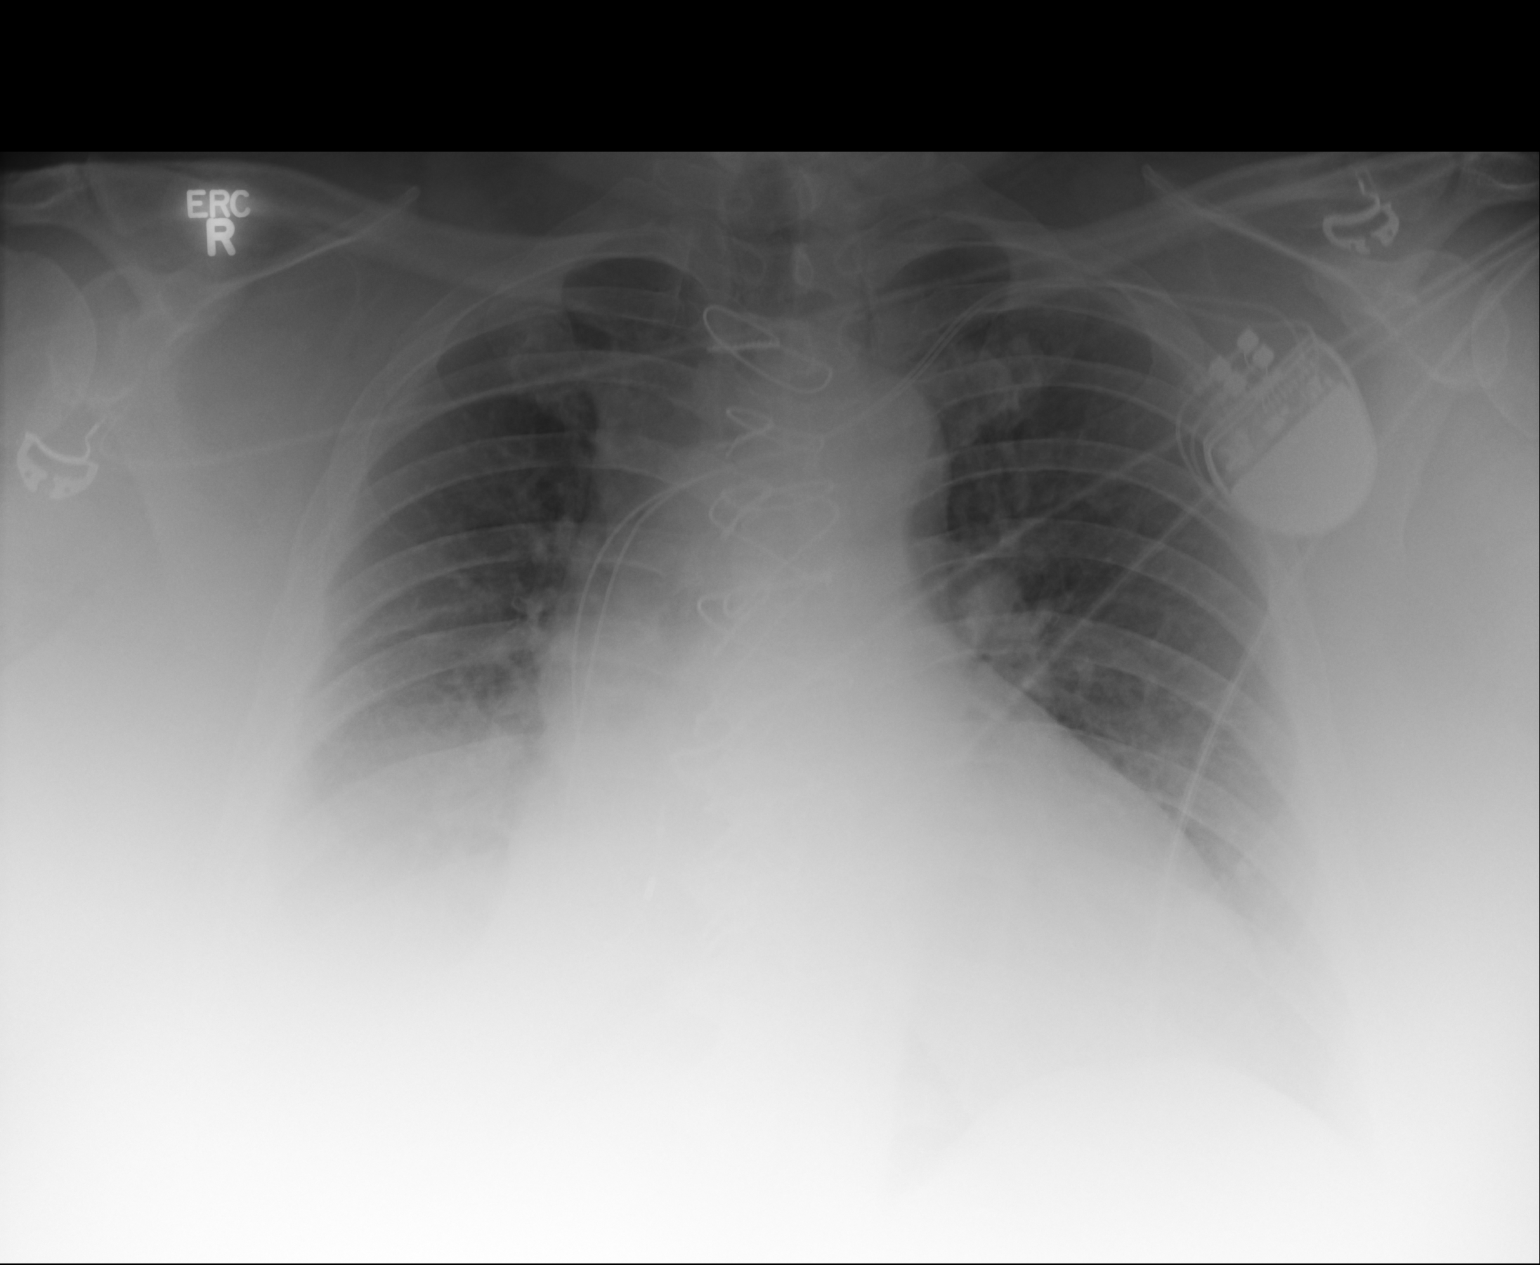

[1 of 1 positions shown; findings below may reference images not displayed]

FINDINGS: Left-sided pacemaker overlies large cardiac silhouette. Lung bases
are poorly evaluated. Potential bilateral pleural effusions. Upper
lungs appear clear.
IMPRESSION: Cardiomegaly and potential bilateral effusions. Lung bases poorly
evaluated.

## 2016-04-16 IMAGING — CR DG CHEST 1V PORT
1 series · 1 of 1 positions shown · non-contrast
Comparison: Prior radiograph from 01/17/2014

CLINICAL DATA: Initial evaluation for left IJ approach central
venous catheter placement.

EXAM:
PORTABLE CHEST - 1 VIEW

[ap]
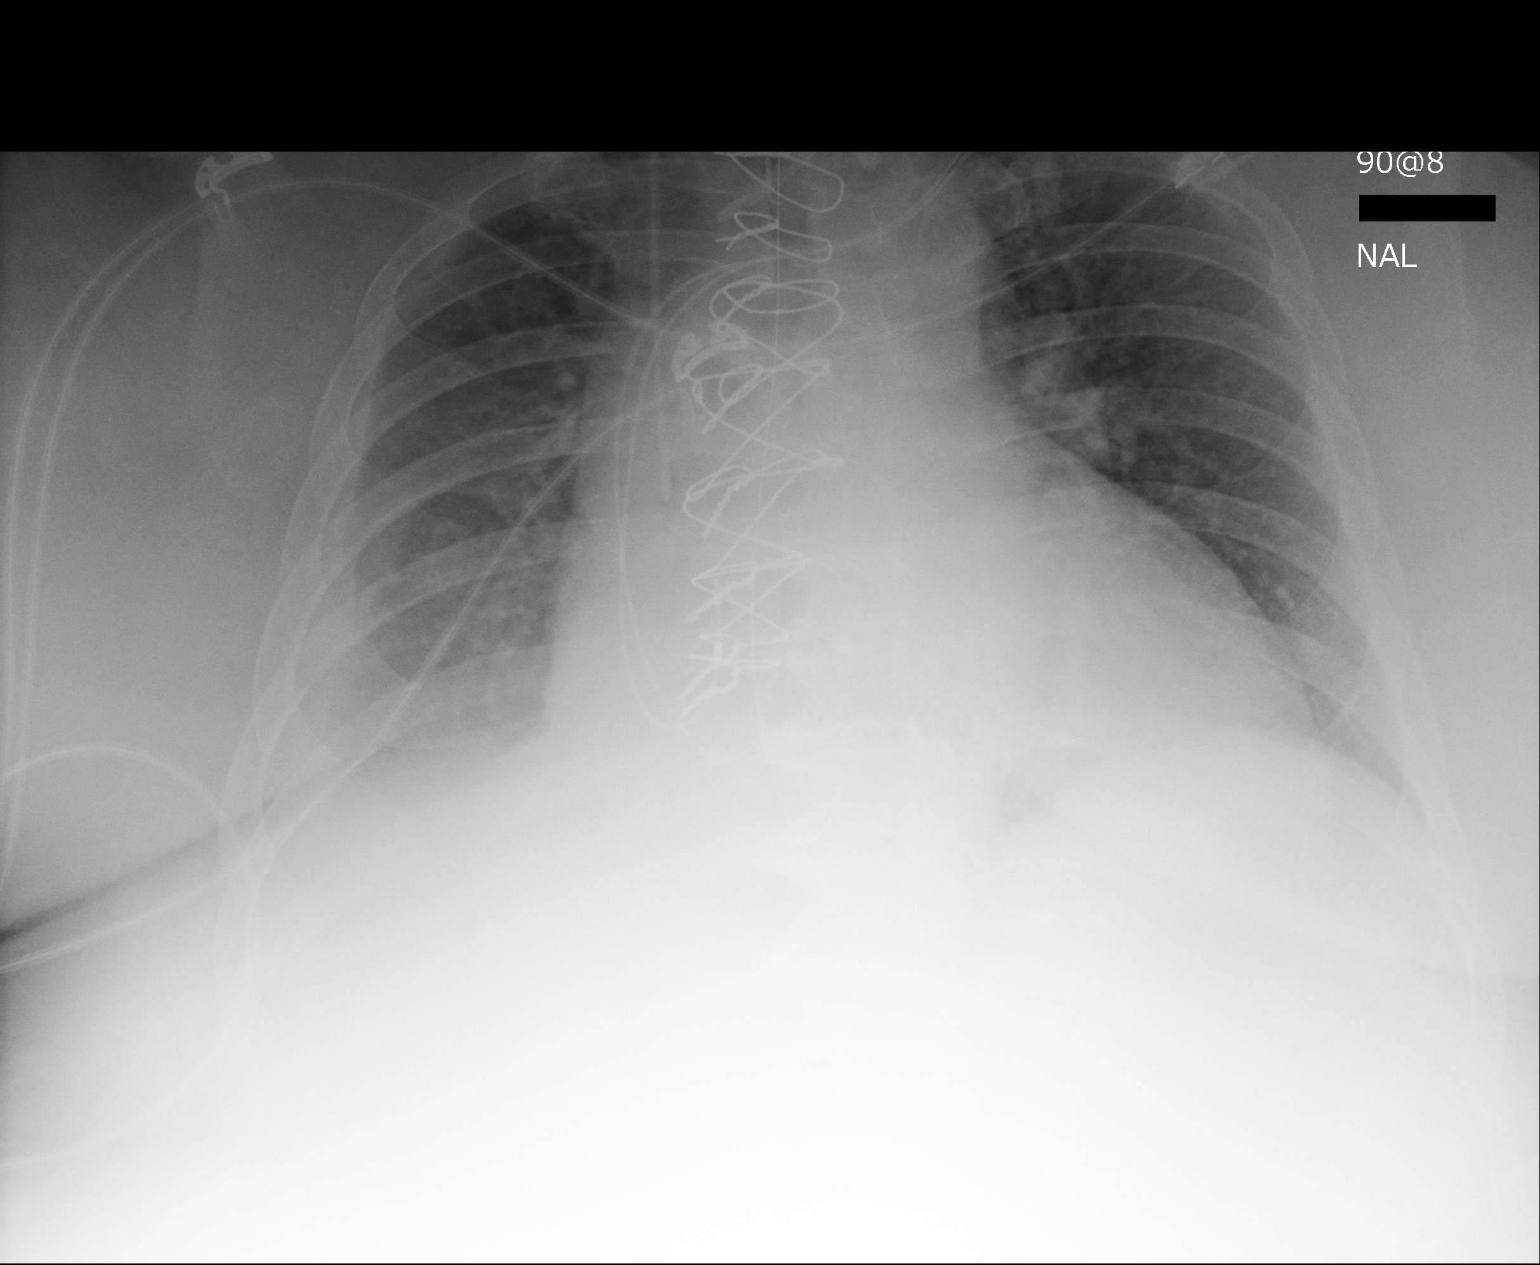

[1 of 1 positions shown; findings below may reference images not displayed]

FINDINGS: There has been interval placement of a a left IJ approach central
venous catheter with tip overlying the expected location of the left
brachiocephalic vein. Patient remains intubated with the tip of the
endotracheal tube positioned approximately 4.1 cm above the carina.
Enteric tube courses into the abdomen. Right IJ approach central
venous catheter unchanged. Median sternotomy wires again noted.
Cardiomegaly is stable. Left-sided transvenous pacemaker partially
visualized.

Lungs are mildly hypoinflated. Bilateral pleural effusions again
seen, right greater than left. There is mild diffuse pulmonary
vascular congestion without overt pulmonary edema. No focal
infiltrate. No pneumothorax.

No acute osseous abnormality.
IMPRESSION: 1. Tip of left IJ approach central venous catheter overlying the
distal left brachiocephalic vein. Remaining support apparatus as
above.
2. Stable cardiomegaly with bilateral pleural effusions, right
greater than left.

## 2016-04-16 IMAGING — CR DG ABDOMEN 1V
1 series · 1 of 1 positions shown · non-contrast
Comparison: None.

CLINICAL DATA: Orogastric tube placement.

EXAM:
ABDOMEN - 1 VIEW

[supine kub]
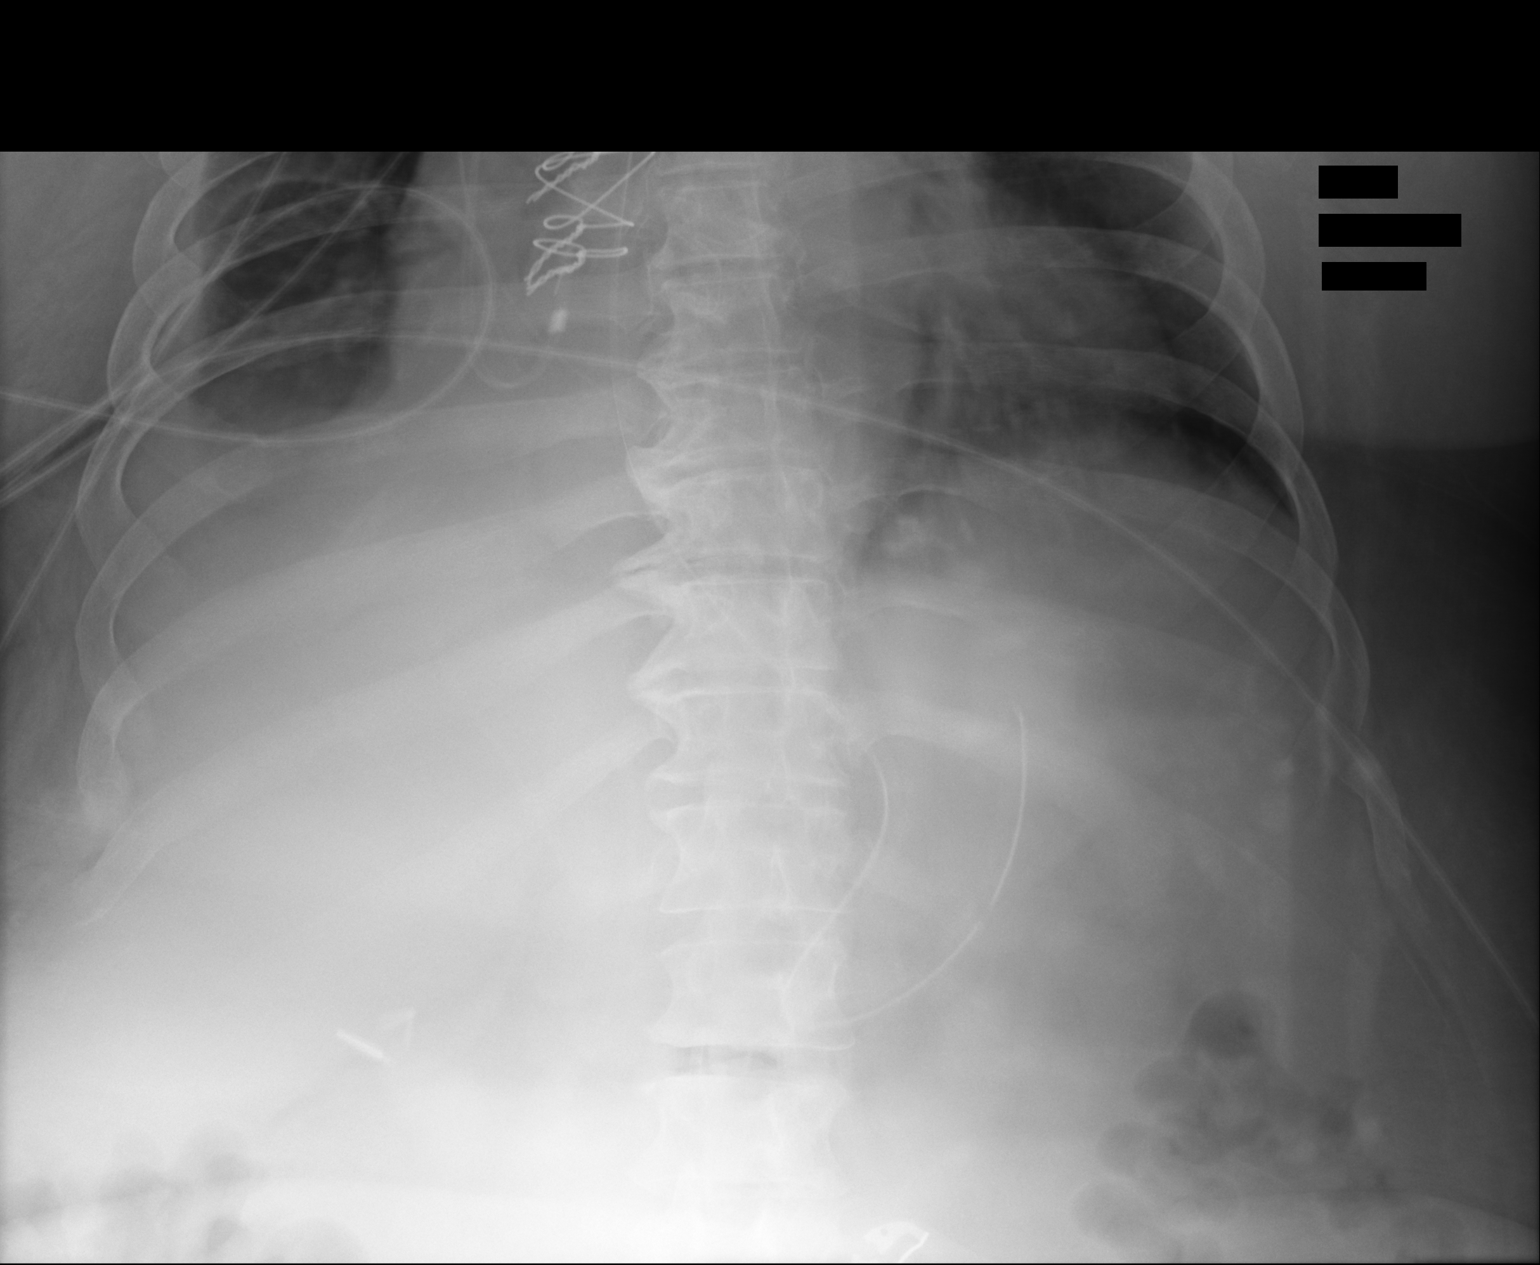

[1 of 1 positions shown; findings below may reference images not displayed]

FINDINGS: Enteric tube is partially looped in the left upper abdomen with tip
directed cranially in the expected region of the proximal stomach.
Side hole is well beyond the GE junction. Right upper quadrant
surgical clips are noted. Gas is partially visualized in nondilated
colon. Right pleural effusion and right basilar lung opacities are
more fully evaluated on concurrent chest radiograph.
IMPRESSION: Enteric tube overlying the stomach.

## 2016-04-16 IMAGING — CR DG CHEST 1V PORT
1 series · 1 of 1 positions shown · non-contrast
Comparison: 01/16/2014

CLINICAL DATA: Intubation.

EXAM:
PORTABLE CHEST - 1 VIEW

[ap]
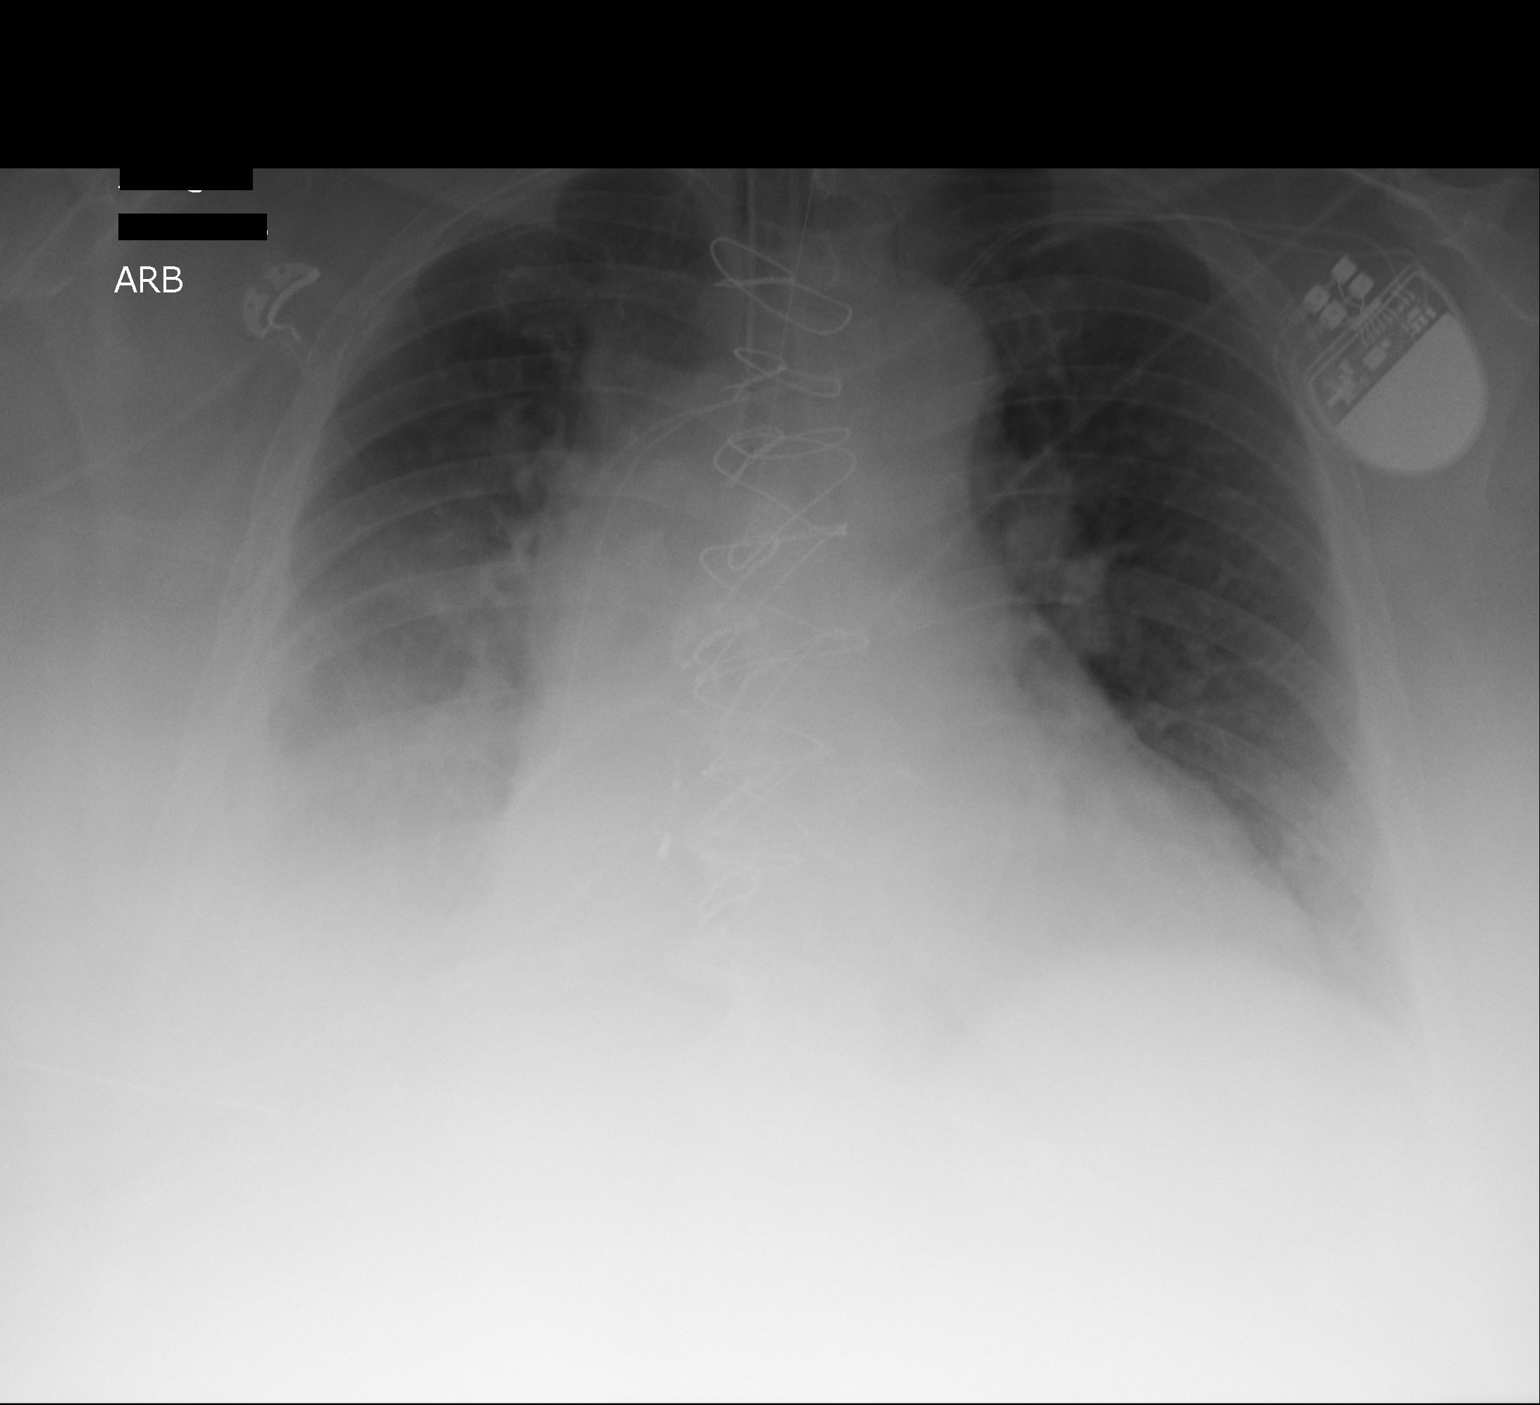

[1 of 1 positions shown; findings below may reference images not displayed]

FINDINGS: Interval endotracheal tube placement with tip approximately 3 cm
above the carina. Dual lead pacemaker and sequelae of prior median
sternotomy are again identified. Cardiac silhouette remains
enlarged. Right basilar opacity is stable to slightly increased and
may reflect a combination of layering pleural effusion and
parenchymal lung opacities. No sizable left pleural effusion is
identified. No pneumothorax is identified.
IMPRESSION: 1. Interval endotracheal tube placement as above.
2. Stable to slight worsening of right basilar opacity which may
reflect a combination of pleural effusion and atelectasis versus
pneumonia.

## 2016-08-19 ENCOUNTER — Other Ambulatory Visit: Payer: Self-pay | Admitting: Nurse Practitioner

## 2016-08-19 DIAGNOSIS — K7460 Unspecified cirrhosis of liver: Secondary | ICD-10-CM

## 2016-08-26 ENCOUNTER — Ambulatory Visit
Admission: RE | Admit: 2016-08-26 | Discharge: 2016-08-26 | Disposition: A | Discharge status: Home / Self Care | Payer: Medicare Other | Source: Ambulatory Visit | Attending: Nurse Practitioner | Admitting: Nurse Practitioner

## 2016-08-26 DIAGNOSIS — K7460 Unspecified cirrhosis of liver: Secondary | ICD-10-CM | POA: Diagnosis not present

## 2016-08-26 DIAGNOSIS — R161 Splenomegaly, not elsewhere classified: Secondary | ICD-10-CM | POA: Insufficient documentation

## 2016-09-11 ENCOUNTER — Inpatient Hospital Stay: Payer: Medicare Other

## 2016-09-11 ENCOUNTER — Inpatient Hospital Stay: Payer: Medicare Other | Admitting: Internal Medicine

## 2016-09-11 NOTE — Progress Notes (Deleted)
[facility] NOTE  Patient Care Team: Ellamae Sia, MD as PCP - General (Internal Medicine) Robert Bellow, MD (General Surgery)  CHIEF COMPLAINTS/PURPOSE OF CONSULTATION:   # April 2017- IRON DEFICIENCY ANEMIA sec to chronic GI blood loss; [Hb-7/Feb 2017] Recom IV ferrahem  # Chronic GI blood loss- EDG/capsule [feb 2017]- gastrtitis ? NSAIDs/COLO-July 2016/ [Dr.Elliot]  # Bovine heart valve [not on anti-coagulation]  HISTORY OF PRESENTING ILLNESS:  Theresa Leon 61 y.o.  female morbidly obese multiple medical problems history of iron deficiency anemia likely GI blood loss/unclear source is here for follow-up.  Patient complains of insomnia- question related to increased dose of metformin. She continues to deny any blood in stools black stools. Chronic abdominal pain- question attributable to abdominal hernia. Not any worse. Denies any bleeding. She is awaiting evaluation with surgery for possible gastric bypass.  ROS: A complete 10 point review of system is done which is negative except mentioned above in history of present illness  MEDICAL HISTORY:  Past Medical History:  Diagnosis Date  . Acute on chronic renal failure ([place])   . Anemia   . Asthma 1975  . COPD (chronic obstructive pulmonary disease) ([place])   . Diabetes ([employer])   . Diabetes mellitus without complication ([place]) 6440  . Diastolic CHF, acute on chronic (HCC)   . H/O aortic valve replacement   . Heart disease   . Hypothyroid   . Morbid obesity ([place])   . 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 OSA (obstructive sleep apnea)     SURGICAL HISTORY: Past Surgical History:  Procedure Laterality Date  . aortic valve relaced   2008  . BACK SURGERY     AS CHILD  . BREAST SURGERY  September 25, 2011   intraductal papilloma of the left breast  . CHOLECYSTECTOMY  2007  . COLONOSCOPY WITH PROPOFOL N/A 08/04/2014   Procedure: COLONOSCOPY WITH PROPOFOL;  Surgeon:  Manya Silvas, MD;  Location: [facility] ENDOSCOPY;  Service: Endoscopy;  Laterality: N/A;  . ESOPHAGOGASTRODUODENOSCOPY Left 03/03/2015   Procedure: ESOPHAGOGASTRODUODENOSCOPY (EGD);  Surgeon: Manya Silvas, MD;  Location: [facility] ENDOSCOPY;  Service: Endoscopy;  Laterality: Left;  . ESOPHAGOGASTRODUODENOSCOPY (EGD) WITH PROPOFOL  08/04/2014   Procedure: ESOPHAGOGASTRODUODENOSCOPY (EGD) WITH PROPOFOL;  Surgeon: Manya Silvas, MD;  Location: [facility] ENDOSCOPY;  Service: Endoscopy;;  . ESOPHAGOGASTRODUODENOSCOPY (EGD) WITH PROPOFOL N/A 04/09/2016   Procedure: ESOPHAGOGASTRODUODENOSCOPY (EGD) WITH PROPOFOL;  Surgeon: Lucilla Lame, MD;  Location: [facility] ENDOSCOPY;  Service: Endoscopy;  Laterality: N/A;  . PHRENIC NERVE PACEMAKER IMPLANTATION  2009  . TRACHEOSTOMY TUBE PLACEMENT N/A 02/04/2014   Procedure: TRACHEOSTOMY;  Surgeon: Rozetta Nunnery, MD;  Location: [place];  Service: ENT;  Laterality: N/A;    SOCIAL HISTORY: Social History   Social History  . Marital status: Widowed    Spouse name: N/A  . Number of children: N/A  . Years of education: N/A   Occupational History  . Not on file.   Social History Main Topics  . Smoking status: Former Research scientist (life sciences)  . Smokeless tobacco: Never Used  . Alcohol use No  . Drug use: No  . Sexual activity: Not on file   Other Topics Concern  . Not on file   Social History Narrative  . No narrative on file    FAMILY HISTORY: Family History  Problem Relation Age of Onset  . Skin telangiectasia Mother   . Lung cancer Maternal Grandmother   .  Breast cancer Maternal Grandmother   . Lung cancer Paternal Grandmother     ALLERGIES:  is allergic to furosemide; mold extract [trichophyton]; tetanus toxoids; qvar [beclomethasone]; toprol xl [metoprolol tartrate]; hctz [hydrochlorothiazide]; nickel; nitroglycerin; and other.  MEDICATIONS:  Current Outpatient Prescriptions  Medication Sig Dispense Refill  . acetaminophen (TYLENOL) 500 MG tablet Take 1,000 mg by  mouth every 6 (six) hours as needed for mild pain or headache.    . albuterol (PROVENTIL HFA;VENTOLIN HFA) 108 (90 BASE) MCG/ACT inhaler Inhale 2 puffs into the lungs every 6 (six) hours as needed for wheezing or shortness of breath.     Marland Kitchen amoxicillin (AMOXIL) 500 MG capsule Take 1 capsule by mouth as needed for other. Takes prior to dental procedures    . atorvastatin (LIPITOR) 10 MG tablet Take 10 mg by mouth daily at 6 PM.    . Calcium Carbonate-Vitamin D (CALCIUM 600+D) 600-400 MG-UNIT tablet Take 1 tablet by mouth at bedtime.    . cholecalciferol (VITAMIN D) 1000 units tablet Take 1,000 Units by mouth daily.    . citalopram (CELEXA) 40 MG tablet Take 40 mg by mouth daily.    Marland Kitchen EPINEPHrine (EPIPEN 2-PAK) 0.3 mg/0.3 mL IJ SOAJ injection Inject 0.3 mg into the muscle once as needed (for severe allergic reaction).    . fluticasone (FLONASE) 50 MCG/ACT nasal spray Place 2 sprays into both nostrils daily as needed for rhinitis.     Marland Kitchen gabapentin (NEURONTIN) 600 MG tablet Take 600 mg by mouth 4 (four) times daily.     Marland Kitchen levothyroxine (SYNTHROID, LEVOTHROID) 100 MCG tablet Take 100 mcg by mouth daily before breakfast.     . loratadine (CLARITIN) 10 MG tablet Take 10 mg by mouth daily.    . metFORMIN (GLUCOPHAGE-XR) 500 MG 24 hr tablet Take 500 mg by mouth 2 (two) times daily.     . montelukast (SINGULAIR) 10 MG tablet Take 10 mg by mouth at bedtime.    . Oxycodone HCl 10 MG TABS Take 10 mg by mouth 3 (three) times daily as needed for severe pain.     Marland Kitchen sucralfate (CARAFATE) 1 g tablet Take 1 tablet (1 g total) by mouth 4 (four) times daily -  with meals and at bedtime. 90 tablet 6  . traZODone (DESYREL) 50 MG tablet Take 50 mg by mouth at bedtime.      No current facility-administered medications for this visit.       Marland Kitchen  PHYSICAL EXAMINATION:   There were no vitals filed for this visit. There were no vitals filed for this visit.  GENERAL: Well-nourished well-developed; Alert, no distress  and comfortable. Obese in a wheel chair.   EYES: positive for pallor OROPHARYNX: no thrush or ulceration. NECK: supple, no masses felt LYMPH:  no palpable lymphadenopathy in the cervical, axillary or inguinal regions LUNGS: clear to auscultation and  No wheeze or crackles HEART/CVS: regular rate & rhythm and no murmurs; 1 plus bil lower extremity edema ABDOMEN: abdomen soft, non-tender and normal bowel sounds Musculoskeletal:no cyanosis of digits and no clubbing  PSYCH: alert & oriented x 3 with fluent speech NEURO: no focal motor/sensory deficits SKIN:  no rashes or significant lesions.   LABORATORY DATA:  I have reviewed the data as listed Lab Results  Component Value Date   WBC 5.0 03/13/2016   HGB 13.0 03/13/2016   HCT 38.3 03/13/2016   MCV 86.5 03/13/2016   PLT 103 (L) 03/13/2016    Recent Labs  09/18/15 1600 03/13/16  1452  NA 138 138  K 3.8 4.0  CL 103 103  CO2 31 29  GLUCOSE 165* 195*  BUN 18 17  CREATININE 1.09* 0.84  CALCIUM 9.2 8.8*  GFRNONAA 55* >60  GFRAA >60 >60  PROT 7.2 7.2  ALBUMIN 3.8 3.6  AST 26 34  ALT 20 27  ALKPHOS 86 82  BILITOT 1.3* 1.5*     ASSESSMENT & PLAN:   No problem-specific Assessment & Plan notes found for this encounter.     Cammie Sickle, MD 09/11/2016 8:37 AM

## 2016-09-11 NOTE — Assessment & Plan Note (Deleted)
#    IRON DEFICIENCY ANEMIA- Likely GI etiology [status post GI workup as above; no clear source]. S/p Ferrahem in oct 2017. Check CBC CMP U42 folic acid and iron studies ferritin today. We will plan IV iron based upon labs.  # Insomnia- ? Etiology ? Sec to metformin; defer to PCP.   # Thrombocytopenia- 90-100s- ? Etiology. 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 Cirrhosis/ no splenomegaly] Discussed re: prednisone [insomnia; weight gain; elevated Blood glucose]/ IVIG as a possibility if she plans to have gastric bypass; also discussed re: Bone marrow bx- hold off for now. She will call if gastric bypass surgery is planned in future.   # Recheck CBC ferritin and iron studies in 6 months- few days prior/possible ferriheme. Follow-up in 6 months.

## 2016-09-24 ENCOUNTER — Emergency Department: Payer: Medicare Other

## 2016-09-24 ENCOUNTER — Encounter: Payer: Self-pay | Admitting: Emergency Medicine

## 2016-09-24 ENCOUNTER — Emergency Department
Admission: EM | Admit: 2016-09-24 | Discharge: 2016-09-24 | Disposition: A | Discharge status: Home / Self Care | Payer: Medicare Other | Attending: Emergency Medicine | Admitting: Emergency Medicine

## 2016-09-24 DIAGNOSIS — S60212A Contusion of left wrist, initial encounter: Secondary | ICD-10-CM

## 2016-09-24 DIAGNOSIS — S8001XA Contusion of right knee, initial encounter: Secondary | ICD-10-CM

## 2016-09-24 DIAGNOSIS — Y9389 Activity, other specified: Secondary | ICD-10-CM | POA: Insufficient documentation

## 2016-09-24 DIAGNOSIS — M25561 Pain in right knee: Secondary | ICD-10-CM | POA: Insufficient documentation

## 2016-09-24 DIAGNOSIS — Z7984 Long term (current) use of oral hypoglycemic drugs: Secondary | ICD-10-CM | POA: Insufficient documentation

## 2016-09-24 DIAGNOSIS — M25532 Pain in left wrist: Secondary | ICD-10-CM | POA: Insufficient documentation

## 2016-09-24 DIAGNOSIS — Z87891 Personal history of nicotine dependence: Secondary | ICD-10-CM | POA: Diagnosis not present

## 2016-09-24 DIAGNOSIS — S46911A Strain of unspecified muscle, fascia and tendon at shoulder and upper arm level, right arm, initial encounter: Secondary | ICD-10-CM | POA: Diagnosis not present

## 2016-09-24 DIAGNOSIS — E119 Type 2 diabetes mellitus without complications: Secondary | ICD-10-CM | POA: Diagnosis not present

## 2016-09-24 DIAGNOSIS — J449 Chronic obstructive pulmonary disease, unspecified: Secondary | ICD-10-CM | POA: Insufficient documentation

## 2016-09-24 DIAGNOSIS — Y998 Other external cause status: Secondary | ICD-10-CM | POA: Diagnosis not present

## 2016-09-24 DIAGNOSIS — R0602 Shortness of breath: Secondary | ICD-10-CM | POA: Insufficient documentation

## 2016-09-24 DIAGNOSIS — Z952 Presence of prosthetic heart valve: Secondary | ICD-10-CM | POA: Diagnosis not present

## 2016-09-24 DIAGNOSIS — Z79899 Other long term (current) drug therapy: Secondary | ICD-10-CM | POA: Diagnosis not present

## 2016-09-24 DIAGNOSIS — W07XXXA Fall from chair, initial encounter: Secondary | ICD-10-CM | POA: Insufficient documentation

## 2016-09-24 DIAGNOSIS — E039 Hypothyroidism, unspecified: Secondary | ICD-10-CM | POA: Diagnosis not present

## 2016-09-24 DIAGNOSIS — I5033 Acute on chronic diastolic (congestive) heart failure: Secondary | ICD-10-CM | POA: Diagnosis not present

## 2016-09-24 DIAGNOSIS — Y92129 Unspecified place in nursing home as the place of occurrence of the external cause: Secondary | ICD-10-CM | POA: Diagnosis not present

## 2016-09-24 DIAGNOSIS — W19XXXA Unspecified fall, initial encounter: Secondary | ICD-10-CM

## 2016-09-24 DIAGNOSIS — S4991XA Unspecified injury of right shoulder and upper arm, initial encounter: Secondary | ICD-10-CM | POA: Diagnosis present

## 2016-09-24 DIAGNOSIS — J45909 Unspecified asthma, uncomplicated: Secondary | ICD-10-CM | POA: Insufficient documentation

## 2016-09-24 LAB — CBC WITH DIFFERENTIAL/PLATELET
Basophils Absolute: 0 10*3/uL (ref 0–0.1)
Basophils Relative: 1 %
Eosinophils Absolute: 0.2 10*3/uL (ref 0–0.7)
Eosinophils Relative: 3 %
HEMATOCRIT: 40.3 % (ref 35.0–47.0)
Hemoglobin: 13.8 g/dL (ref 12.0–16.0)
LYMPHS ABS: 3 10*3/uL (ref 1.0–3.6)
LYMPHS PCT: 48 %
MCH: 31.4 pg (ref 26.0–34.0)
MCHC: 34.3 g/dL (ref 32.0–36.0)
MCV: 91.6 fL (ref 80.0–100.0)
MONO ABS: 0.4 10*3/uL (ref 0.2–0.9)
MONOS PCT: 7 %
NEUTROS ABS: 2.6 10*3/uL (ref 1.4–6.5)
Neutrophils Relative %: 41 %
Platelets: 79 10*3/uL — ABNORMAL LOW (ref 150–440)
RBC: 4.4 MIL/uL (ref 3.80–5.20)
RDW: 13.8 % (ref 11.5–14.5)
WBC: 6.2 10*3/uL (ref 3.6–11.0)

## 2016-09-24 LAB — COMPREHENSIVE METABOLIC PANEL
ALBUMIN: 3.6 g/dL (ref 3.5–5.0)
ALK PHOS: 64 U/L (ref 38–126)
ALT: 30 U/L (ref 14–54)
ANION GAP: 7 (ref 5–15)
AST: 32 U/L (ref 15–41)
BUN: 18 mg/dL (ref 6–20)
CALCIUM: 9 mg/dL (ref 8.9–10.3)
CO2: 31 mmol/L (ref 22–32)
Chloride: 101 mmol/L (ref 101–111)
Creatinine, Ser: 0.97 mg/dL (ref 0.44–1.00)
GFR calc Af Amer: >60 mL/min (ref >60)
GFR calc non Af Amer: >60 mL/min (ref >60)
GLUCOSE: 117 mg/dL — AB (ref 65–99)
POTASSIUM: 4 mmol/L (ref 3.5–5.1)
SODIUM: 139 mmol/L (ref 135–145)
Total Bilirubin: 2 mg/dL — ABNORMAL HIGH (ref 0.3–1.2)
Total Protein: 7 g/dL (ref 6.5–8.1)

## 2016-09-24 MED ORDER — METHOCARBAMOL 500 MG PO TABS: 500.0000 mg | ORAL_TABLET | Freq: Four times a day (QID) | ORAL | 0 refills | Status: DC

## 2016-09-24 MED ORDER — OXYCODONE HCL 5 MG PO TABS
10.0000 mg | ORAL_TABLET | Freq: Once | ORAL | Status: AC
  Administered 2016-09-24: 10 mg via ORAL
  Filled 2016-09-24: qty 2

## 2016-09-24 MED ORDER — METHOCARBAMOL 500 MG PO TABS
1000.0000 mg | ORAL_TABLET | Freq: Once | ORAL | Status: AC
  Administered 2016-09-24: 1000 mg via ORAL
  Filled 2016-09-24: qty 2

## 2016-09-24 MED ORDER — IOPAMIDOL (ISOVUE-370) INJECTION 76%
75.0000 mL | Freq: Once | INTRAVENOUS | Status: AC | PRN
  Administered 2016-09-24: 75 mL via INTRAVENOUS
  Filled 2016-09-24: qty 75

## 2016-09-24 NOTE — ED Provider Notes (Signed)
[facility] Emergency Department Provider Note  ____________________________________________  Time seen: Approximately 4:07 PM  I have reviewed the triage vital signs and the nursing notes.   HISTORY  Chief Complaint Fall    HPI Theresa Leon is a 61 y.o. female who presents to the emergency department via EMS from [facility]. Patient was at this facility visiting a relative when she became short of breath. Patient reports that she is relatively immobile at baseline. She reports that walking an extended distance caused her to get short of breath. Patient went to sit down when she sat on a rolling stool. She reports that the stool rolled out from underneath her causing her to fall. Patient reports that she initially fell towards her left side striking her hand/forearm on a nearby dresser drawer. Patient reports that this caused her to twist, falling and landing on her right knee. She did not hit her head or lose consciousness. Patient is currently complaining of right shoulder pain, left forearm/hand pain, right knee pain. Patient reports that shortness of breath resolved after sitting on the floor. She denies any headache, visual changes, chest pain, shortness of breath, abdominal pain, nausea or vomiting at this time. Patient reports that her shortness of breath from walking did not cause the fall. She reports that the fall was caused by the rolling stool.  Patient has multiple medical complaints to include renal failure, anemia, asthma, COPD, diabetes, CHF, morbid obesity,sleep apnea. Patient denies any complaints of these at this time.   Past Medical History:  Diagnosis Date  . Acute on chronic renal failure ([place])   . Anemia   . Asthma 1975  . COPD (chronic obstructive pulmonary disease) ([place])   . Diabetes ([place])   . Diabetes mellitus without complication ([place]) 1771  . Diastolic CHF, acute on chronic (HCC)   . H/O aortic valve replacement   . Heart disease    . Hypothyroid   . Morbid obesity ([place])   . 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 OSA (obstructive sleep apnea)     Patient Active Problem List   Diagnosis Date Noted  . V tach ([place]) 09/18/2015  . Iron deficiency anemia due to chronic blood loss 05/03/2015  . Gastritis due to nonsteroidal anti-inflammatory drug (NSAID) 03/05/2015  . History of esophagogastroduodenoscopy (EGD) 03/05/2015  . Type 2 diabetes mellitus ([place]) 03/01/2015  . GERD (gastroesophageal reflux disease) 03/01/2015  . HLD (hyperlipidemia) 03/01/2015  . COPD (chronic obstructive pulmonary disease) ([place]) 03/01/2015  . Chronic diastolic CHF (congestive heart failure) ([place]) 03/01/2015  . Hypothyroidism 03/01/2015  . OSA on CPAP 03/01/2015  . Acute posthemorrhagic anemia 03/01/2015  . Dysphagia   . CHF (congestive heart failure) ([place])   . Acute respiratory failure with hypoxia ([place]) 01/26/2014  . Encounter for imaging study to confirm orogastric (OG) tube placement   . Respiratory failure ([place])   . 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 aortic valve relaced   2008  . BACK SURGERY     AS CHILD  . BREAST SURGERY  September 25, 2011   intraductal papilloma of the left breast  . CHOLECYSTECTOMY  2007  . COLONOSCOPY WITH PROPOFOL N/A 08/04/2014   Procedure: COLONOSCOPY WITH PROPOFOL;  Surgeon: Manya Silvas, MD;  Location: [facility] ENDOSCOPY;  Service: Endoscopy;  Laterality: N/A;  . ESOPHAGOGASTRODUODENOSCOPY Left 03/03/2015   Procedure: ESOPHAGOGASTRODUODENOSCOPY (EGD);  Surgeon:  Manya Silvas, MD;  Location: [facility] ENDOSCOPY;  Service: Endoscopy;  Laterality: Left;  . ESOPHAGOGASTRODUODENOSCOPY (EGD) WITH PROPOFOL  08/04/2014   Procedure: ESOPHAGOGASTRODUODENOSCOPY (EGD) WITH PROPOFOL;  Surgeon: Manya Silvas, MD;  Location: [facility] ENDOSCOPY;  Service: Endoscopy;;  . ESOPHAGOGASTRODUODENOSCOPY (EGD) WITH PROPOFOL N/A  04/09/2016   Procedure: ESOPHAGOGASTRODUODENOSCOPY (EGD) WITH PROPOFOL;  Surgeon: Lucilla Lame, MD;  Location: [facility] ENDOSCOPY;  Service: Endoscopy;  Laterality: N/A;  . PHRENIC NERVE PACEMAKER IMPLANTATION  2009  . TRACHEOSTOMY TUBE PLACEMENT N/A 02/04/2014   Procedure: TRACHEOSTOMY;  Surgeon: Rozetta Nunnery, MD;  Location: [facility];  Service: ENT;  Laterality: N/A;    Prior to Admission medications   Medication Sig Start Date End Date Taking? Authorizing Provider  acetaminophen (TYLENOL) 500 MG tablet Take 1,000 mg by mouth every 6 (six) hours as needed for mild pain or headache.    [provider]  albuterol (PROVENTIL HFA;VENTOLIN HFA) 108 (90 BASE) MCG/ACT inhaler Inhale 2 puffs into the lungs every 6 (six) hours as needed for wheezing or shortness of breath.     [provider]  amoxicillin (AMOXIL) 500 MG capsule Take 1 capsule by mouth as needed for other. Takes prior to dental procedures 03/07/16   [provider]  atorvastatin (LIPITOR) 10 MG tablet Take 10 mg by mouth daily at 6 PM.    [provider]  Calcium Carbonate-Vitamin D (CALCIUM 600+D) 600-400 MG-UNIT tablet Take 1 tablet by mouth at bedtime.    [provider]  cholecalciferol (VITAMIN D) 1000 units tablet Take 1,000 Units by mouth daily.    [provider]  citalopram (CELEXA) 40 MG tablet Take 40 mg by mouth daily.    [provider]  EPINEPHrine (EPIPEN 2-PAK) 0.3 mg/0.3 mL IJ SOAJ injection Inject 0.3 mg into the muscle once as needed (for severe allergic reaction).    [provider]  fluticasone (FLONASE) 50 MCG/ACT nasal spray Place 2 sprays into both nostrils daily as needed for rhinitis.     [provider]  gabapentin (NEURONTIN) 600 MG tablet Take 600 mg by mouth 4 (four) times daily.     [provider]  levothyroxine (SYNTHROID, LEVOTHROID) 100 MCG tablet Take 100 mcg by mouth daily before breakfast.     [provider]   loratadine (CLARITIN) 10 MG tablet Take 10 mg by mouth daily.    [provider]  metFORMIN (GLUCOPHAGE-XR) 500 MG 24 hr tablet Take 500 mg by mouth 2 (two) times daily.     [provider]  methocarbamol (ROBAXIN) 500 MG tablet Take 1 tablet (500 mg total) by mouth 4 (four) times daily. 09/24/16   Madissen Wyse, Charline Bills, PA-C  montelukast (SINGULAIR) 10 MG tablet Take 10 mg by mouth at bedtime.    [provider]  Oxycodone HCl 10 MG TABS Take 10 mg by mouth 3 (three) times daily as needed for severe pain.     [provider]  sucralfate (CARAFATE) 1 g tablet Take 1 tablet (1 g total) by mouth 4 (four) times daily -  with meals and at bedtime. 03/05/15   Theodoro Grist, MD  traZODone (DESYREL) 50 MG tablet Take 50 mg by mouth at bedtime.     [provider]    Allergies Furosemide; Mold extract [trichophyton]; Tetanus toxoids; Qvar [beclomethasone]; Toprol xl [metoprolol tartrate]; Hctz [hydrochlorothiazide]; Nickel; Nitroglycerin; and Other  Family History  Problem Relation Age of Onset  . Skin telangiectasia Mother   . Lung cancer Maternal  Grandmother   . Breast cancer Maternal Grandmother   . Lung cancer Paternal Grandmother     Social History Social History  Substance Use Topics  . Smoking status: Former Research scientist (life sciences)  . Smokeless tobacco: Never Used  . Alcohol use No     Review of Systems  Constitutional: No fever/chills Eyes: No visual changes. Cardiovascular: no chest pain. Respiratory: no cough. Positive for shortness of breath after walking, completely resolved at this time.. Gastrointestinal: No abdominal pain.  No nausea, no vomiting.   Musculoskeletal: Positive for right shoulder pain, left wrist pain, right knee pain. Skin: Negative for rash, abrasions, lacerations, ecchymosis. Neurological: Negative for headaches, focal weakness or numbness. 10-point ROS otherwise  negative.  ____________________________________________   PHYSICAL EXAM:  VITAL SIGNS: ED Triage Vitals  Enc Vitals Group     BP 09/24/16 1527 133/76     Pulse Rate 09/24/16 1527 63     Resp 09/24/16 1527 16     Temp 09/24/16 1527 98.1 F (36.7 C)     Temp Source 09/24/16 1527 Oral     SpO2 09/24/16 1527 96 %     Weight 09/24/16 1525 (!) 338 lb (153.3 kg)     Height 09/24/16 1525 5' 6"  (1.676 m)     Head Circumference --      [place] [facility] --      Pain Score 09/24/16 1525 4     Pain Loc --      Pain Edu? --      Excl. in [place]? --      Constitutional: Alert and oriented. Well appearing and in no acute distress. Eyes: Conjunctivae are normal. PERRL. EOMI. Head: Atraumatic.No bowel signs, raccoon eyes, serosanguineous fluid drainage from the ears or nares. No tenderness to palpation over the skull. ENT:      Ears:       Nose: No congestion/rhinnorhea.      Mouth/Throat: Mucous membranes are moist.  Neck: No stridor.  No midline cervical spine tenderness to palpation. Diffuse tenderness to palpation of the right paraspinal muscle group and right latissimus dorsi muscle. No tenderness to palpation over osseous structures of the cervical spine. Radial pulse intact bilateral upper extremity's. Sensation intact and equal bilateral upper extremities.  Cardiovascular: Normal rate, regular rhythm. Normal S1 and S2.  Good peripheral circulation. Respiratory: Normal respiratory effort without tachypnea or retractions. Speaking in full sentences with no shortness of breath. Lungs CTAB. Good air entry to the bases with no decreased or absent breath sounds. Musculoskeletal: Full range of motion to all extremities. No gross deformities appreciated. No visible deformity to the spine or shoulder upon inspection. Patient is tender to palpation along the right paraspinal muscle group in the cervical region as well as latissimus dorsi muscle right side. No tenderness to palpation over osseous structures of  the spine. No tenderness to palpation over the scapula. No other tenderness to palpation in the right shoulder. Radial pulse intact distally. Sensation intact to sleep. No gross edema or deformity noted to left wrist upon inspection. Full range of motion to the wrist. Patient has minimal ecchymosis noted to the medial aspect of the left wrist. No gross tenderness to palpation over the osseous structures of the wrist. Radial pulse intact. Sensation intact all 5 digits. Visualization of the right knee reveals no ecchymosis, edema, deformity. Patient has full range of motion to the knee. Patient is mildly tender to palpation over the distal femur and along the MCL distribution. No specific point tenderness. No palpable  abnormality. Varus, valgus, Lachman's, McMurray's is negative. Dorsalis pedis pulse intact distally. Sensation intact distally. Neurologic:  Normal speech and language. No gross focal neurologic deficits are appreciated.  Skin:  Skin is warm, dry and intact. No rash noted. Psychiatric: Mood and affect are normal. Speech and behavior are normal. Patient exhibits appropriate insight and judgement.   ____________________________________________   LABS (all labs ordered are listed, but only abnormal results are displayed)  Labs Reviewed  COMPREHENSIVE METABOLIC PANEL - Abnormal; Notable for the following:       Result Value   Glucose, Bld 117 (*)    Total Bilirubin 2.0 (*)    All other components within normal limits  CBC WITH DIFFERENTIAL/PLATELET - Abnormal; Notable for the following:    Platelets 79 (*)    All other components within normal limits   ____________________________________________  EKG   ____________________________________________  RADIOLOGY Diamantina Providence Williette Loewe, personally viewed and evaluated these images (plain radiographs) as part of my medical decision making, as well as reviewing the written report by the radiologist.  Dg Cervical Spine 2-3  Views  Result Date: 09/24/2016 CLINICAL DATA:  Fall, neck pain EXAM: CERVICAL SPINE - 2-3 VIEW COMPARISON:  None. FINDINGS: Degenerative disc and facet disease throughout the cervical spine. No visible fracture or malalignment. Prevertebral soft tissues are normal. IMPRESSION: Degenerative changes.  No acute bony abnormality. Electronically Signed   By: Rolm Baptise M.D.   On: 09/24/2016 17:39   Dg Shoulder Right  Result Date: 09/24/2016 CLINICAL DATA:  Fall on hardwood hitting left arm on dresser and landing on knees and left hand. Pain. EXAM: RIGHT SHOULDER - 2+ VIEW COMPARISON:  None. FINDINGS: A bony lucency is seen at the base of the glenoid traversing the scapular spine, best seen on the axillary view and may represent a slightly displaced fracture of the scapula. The glenohumeral joint and AC articulations are maintained with osteoarthritis of the AC joint. No adjacent rib fractures noted. The patient is status post median sternotomy. IMPRESSION: Lucency at the base of the glenoid raise concern for a scapular fracture. CT may help for further correlation. Electronically Signed   By: Ashley Royalty M.D.   On: 09/24/2016 17:44   Dg Wrist Complete Left  Result Date: 09/24/2016 CLINICAL DATA:  Fall.  Left wrist and hand pain. EXAM: LEFT WRIST - COMPLETE 3+ VIEW COMPARISON:  None. FINDINGS: Advanced degenerative changes at the first carpometacarpal joint. No acute bony abnormality. Specifically, no fracture, subluxation, or dislocation. Soft tissues are intact. IMPRESSION: No acute bony abnormality. Electronically Signed   By: Rolm Baptise M.D.   On: 09/24/2016 17:40   Ct Chest W Contrast  Result Date: 09/24/2016 CLINICAL DATA:  Fall onto [place]. Hit left arm on dresser and landed on knees and left hand. C/O left hand pain and right knee pain. Also c/o stiff neck. EXAM: CT CHEST WITH CONTRAST TECHNIQUE: Multidetector CT imaging of the chest was performed during intravenous contrast administration.  CONTRAST:  75 mL of Isovue 370 intravenous contrast COMPARISON:  Chest radiograph, 09/18/2015 FINDINGS: Cardiovascular: Heart is mildly enlarged. There changes from cardiac surgery and aortic valve replacement. The thoracic aorta is dilated along its ascending portion to 4.4 cm. No aortic dissection. Minor atherosclerotic calcifications noted along the arch. 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 Mild atherosclerotic calcifications are noted at the origin of the right subclavian artery without significant stenosis. Mediastinum/Nodes: No neck base, axillary, mediastinal or hilar  masses or enlarged lymph nodes. Esophagus is unremarkable. Trachea is widely patent. Lungs/Pleura: Minor areas of subpleural linear and reticular opacity consistent with subsegmental atelectasis, scarring or a combination. Mild scarring with minor bronchiectasis noted along the posteromedial right lower lobe. There is no evidence of pneumonia. No pulmonary edema. No lung mass or suspicious nodule. No pleural effusion or pneumothorax. Upper Abdomen: Fatty infiltration of the liver. Status post cholecystectomy. Several prominent lymph nodes, largest adjacent to the left adrenal gland measuring 14 mm in short axis. No acute findings. Musculoskeletal: No acute fracture. No osteoblastic or osteolytic lesions. IMPRESSION: 1. No acute findings.  No evidence of injury to the chest. 2. Changes from previous cardiac surgery and aortic valve replacement. Stable mild cardiomegaly. 3. No evidence of lung contusion, pneumonia or pulmonary edema. No pneumothorax. Electronically Signed   By: Lajean Manes M.D.   On: 09/24/2016 19:38   Dg Knee Complete 4 Views Right  Result Date: 09/24/2016 CLINICAL DATA:  Fall onto [place]. Hit left arm on dresser and landed on knees and left hand. C/O left hand/wrist pain and right knee pain. Also c/o stiff neck and pain radiating to right shoulder EXAM:  RIGHT KNEE - COMPLETE 4+ VIEW COMPARISON:  11/07/2009 FINDINGS: No fracture or dislocation.  No bone lesion. There is joint space narrowing, most severe involving the medial compartment where there is also subchondral sclerosis. Small marginal osteophytes project from all 3 compartments. Bones are demineralized. No convincing joint effusion.  Soft tissues are unremarkable. IMPRESSION: 1. No fracture, dislocation or acute finding. 2. 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 Electronically Signed   By: Lajean Manes M.D.   On: 09/24/2016 17:41    ____________________________________________    PROCEDURES  Procedure(s) performed:    Procedures    Medications  oxyCODONE (Oxy IR/ROXICODONE) immediate release tablet 10 mg (not administered)  methocarbamol (ROBAXIN) tablet 1,000 mg (not administered)  iopamidol (ISOVUE-370) 76 % injection 75 mL (75 mLs Intravenous Contrast Given 09/24/16 1919)     ____________________________________________   INITIAL IMPRESSION / ASSESSMENT AND PLAN / ED COURSE  Pertinent labs & imaging results that were available during my care of the patient were reviewed by me and considered in my medical decision making (see chart for details).  Review of the [place] CSRS was performed in accordance of the [place] prior to dispensing any controlled drugs.  Clinical Course as of Sep 24 1956  Tue Sep 24, 2016  1622 Patient presented to the emergency department via EMS status post a fall. Patient has significant medical history but denies any complications with same. Patient reports that when she walks for any period of time, she becomes short of breath. This occurred today. When she went to sit down, the stool rolled out from underneath her causing the fall. At this time, patient is complaining of musculoskeletal complaints to include right shoulder pain, left wrist/hand pain, right knee pain. Patient denies any shortness of breath or chest pain at this time. I  have offered the patient, labs, imaging, EKG for evaluation. At this time, patient declines labs and EKG. Patient request imaging on her neck, wrist, knee. Patient reports "I think it's just bruised but wanted to make sure." I have explained the risks of foregoing EKG and lab work at this time. Patient verbalizes understanding and states that she has had complaints with her chronic medical problems, and that this is not like her previous complaints. Patient again declines labs and EKG. At this time, x-rays of the cervical  spine, right shoulder, left wrist, right knee are undertaken.  [JC]  8916 X-ray of the right shoulder reveals lucency of the glenoid process concerning for scapular fracture. Due to pain in this region with tenderness to palpation and questionable x-ray, patient will be evaluated with CT scan for possible scapular fracture.  [JC]    Clinical Course User Index [JC] Gaberiel Youngblood, Charline Bills, PA-C    Patient's diagnosis is consistent with Fall resulting in right shoulder strain, left wrist contusion, right knee contusion. Patient was evaluated status post fall with x-rays of the neck, shoulder, wrist, knee. Shoulder returned with possible scapular fracture. CT chest was ordered for further evaluation. This reveals no acute osseous abnormality and no acute thoracic injury. Patient originally had become short of breath while walking. She declined initially labs and EKG. At this time, patient has been symptom free with the exception of musculoskeletal pain for the entire duration of her visit. Diagnosis as above. Patient is on chronic pain management therapy and will continue same. Patient will be discharged home with prescriptions for muscle relaxer. Patient is to follow up with primary care as needed or otherwise directed. Patient is given ED precautions to return to the ED for any worsening or new symptoms.     ____________________________________________  FINAL CLINICAL IMPRESSION(S) / ED  DIAGNOSES  Final diagnoses:  Fall, initial encounter  Strain of right shoulder, initial encounter  Contusion of left wrist, initial encounter  Contusion of right knee, initial encounter      NEW MEDICATIONS STARTED DURING THIS VISIT:  New Prescriptions   METHOCARBAMOL (ROBAXIN) 500 MG TABLET    Take 1 tablet (500 mg total) by mouth 4 (four) times daily.        This chart was dictated using voice recognition software/Dragon. Despite best efforts to proofread, errors can occur which can change the meaning. Any change was purely unintentional.    Darletta Moll, PA-C 09/24/16 Marcell Anger, MD 09/25/16 0010

## 2016-09-24 NOTE — ED Notes (Signed)
Patient transported to CT 

## 2016-09-24 NOTE — ED Triage Notes (Signed)
Fall onto [place].  Hit left arm on dresser and landed on knees and left hand.  C/O left hand pain and right knee pain.  Also c/o stiff neck.

## 2016-09-24 NOTE — ED Notes (Signed)

## 2016-09-24 NOTE — ED Notes (Signed)
See triage note  Brought in via ems from [place] she went to visit her mother and  [address]..hit left arm on dresser and right knee  States was not hurting at first but now her knee is stiff and also having some pain to neck

## 2017-01-13 ENCOUNTER — Emergency Department: Payer: Medicare Other

## 2017-01-13 ENCOUNTER — Emergency Department
Admission: EM | Admit: 2017-01-13 | Discharge: 2017-01-14 | Disposition: A | Discharge status: Home / Self Care | Payer: Medicare Other | Attending: Emergency Medicine | Admitting: Emergency Medicine

## 2017-01-13 ENCOUNTER — Encounter: Payer: Self-pay | Admitting: Intensive Care

## 2017-01-13 DIAGNOSIS — J45909 Unspecified asthma, uncomplicated: Secondary | ICD-10-CM | POA: Diagnosis not present

## 2017-01-13 DIAGNOSIS — I5032 Chronic diastolic (congestive) heart failure: Secondary | ICD-10-CM | POA: Insufficient documentation

## 2017-01-13 DIAGNOSIS — Z7984 Long term (current) use of oral hypoglycemic drugs: Secondary | ICD-10-CM | POA: Diagnosis not present

## 2017-01-13 DIAGNOSIS — E119 Type 2 diabetes mellitus without complications: Secondary | ICD-10-CM | POA: Diagnosis not present

## 2017-01-13 DIAGNOSIS — R42 Dizziness and giddiness: Secondary | ICD-10-CM | POA: Insufficient documentation

## 2017-01-13 DIAGNOSIS — J449 Chronic obstructive pulmonary disease, unspecified: Secondary | ICD-10-CM | POA: Diagnosis not present

## 2017-01-13 DIAGNOSIS — E039 Hypothyroidism, unspecified: Secondary | ICD-10-CM | POA: Insufficient documentation

## 2017-01-13 DIAGNOSIS — E860 Dehydration: Secondary | ICD-10-CM | POA: Diagnosis not present

## 2017-01-13 DIAGNOSIS — Z87891 Personal history of nicotine dependence: Secondary | ICD-10-CM | POA: Diagnosis not present

## 2017-01-13 DIAGNOSIS — N390 Urinary tract infection, site not specified: Secondary | ICD-10-CM | POA: Diagnosis not present

## 2017-01-13 DIAGNOSIS — Z79899 Other long term (current) drug therapy: Secondary | ICD-10-CM | POA: Diagnosis not present

## 2017-01-13 DIAGNOSIS — R531 Weakness: Secondary | ICD-10-CM | POA: Diagnosis present

## 2017-01-13 LAB — CBC
HEMATOCRIT: 44.2 % (ref 35.0–47.0)
HEMOGLOBIN: 14.9 g/dL (ref 12.0–16.0)
MCH: 30.5 pg (ref 26.0–34.0)
MCHC: 33.7 g/dL (ref 32.0–36.0)
MCV: 90.4 fL (ref 80.0–100.0)
Platelets: 98 10*3/uL — ABNORMAL LOW (ref 150–440)
RBC: 4.88 MIL/uL (ref 3.80–5.20)
RDW: 13.4 % (ref 11.5–14.5)
WBC: 8 10*3/uL (ref 3.6–11.0)

## 2017-01-13 LAB — BASIC METABOLIC PANEL
ANION GAP: 9 (ref 5–15)
BUN: 28 mg/dL — ABNORMAL HIGH (ref 6–20)
CALCIUM: 9.8 mg/dL (ref 8.9–10.3)
CO2: 33 mmol/L — AB (ref 22–32)
Chloride: 97 mmol/L — ABNORMAL LOW (ref 101–111)
Creatinine, Ser: 0.93 mg/dL (ref 0.44–1.00)
Glucose, Bld: 119 mg/dL — ABNORMAL HIGH (ref 65–99)
POTASSIUM: 4.5 mmol/L (ref 3.5–5.1)
Sodium: 139 mmol/L (ref 135–145)

## 2017-01-13 LAB — TROPONIN I

## 2017-01-13 MED ORDER — SODIUM CHLORIDE 0.9 % IV BOLUS (SEPSIS)
500.0000 mL | Freq: Once | INTRAVENOUS | Status: AC
  Administered 2017-01-14: 500 mL via INTRAVENOUS

## 2017-01-13 MED ORDER — SODIUM CHLORIDE 0.9 % IV BOLUS (SEPSIS): 1000.0000 mL | Freq: Once | INTRAVENOUS | Status: DC

## 2017-01-13 NOTE — ED Provider Notes (Signed)
[facility] Emergency Department Provider Note   ____________________________________________   First MD Initiated Contact with Patient 01/13/17 2309     (approximate)  I have reviewed the triage vital signs and the nursing notes.   HISTORY  Chief Complaint Weakness; Dizziness; and Chest Pain    HPI Theresa Leon is a 61 y.o. female who presents to the ED from home with a chief complaint of generalized weakness, lightheadedness and dizziness.  Patient has a history of diabetes, CHF, COPD who reports feeling "drunk and woozy" this afternoon while she was driving.  Felt like her sugar was low.  She was able to make it home and drink soda and eat some cheese.  Felt somewhat better but had some chest pressure and came to the ED for evaluation.  All symptoms resolved now.  Denies recent fever, chills, shortness of breath, abdominal pain, nausea, vomiting, diarrhea, headache, neck pain, vision changes.  Patient does note that last week she wet the bed and has had increased urinary frequency.  Denies recent travel or trauma.   Past Medical History:  Diagnosis Date  . Acute on chronic renal failure ([place])   . Anemia   . Asthma 1975  . COPD (chronic obstructive pulmonary disease) ([place])   . Diabetes ([place])   . Diabetes mellitus without complication ([place]) 8850  . Diastolic CHF, acute on chronic (HCC)   . H/O aortic valve replacement   . Heart disease   . Hypothyroid   . Morbid obesity ([place])   . 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 OSA (obstructive sleep apnea)     Patient Active Problem List   Diagnosis Date Noted  . V tach ([place]) 09/18/2015  . Iron deficiency anemia due to chronic blood loss 05/03/2015  . Gastritis due to nonsteroidal anti-inflammatory drug (NSAID) 03/05/2015  . History of esophagogastroduodenoscopy (EGD) 03/05/2015  . Type 2 diabetes mellitus ([place]) 03/01/2015  . GERD (gastroesophageal reflux disease)  03/01/2015  . HLD (hyperlipidemia) 03/01/2015  . COPD (chronic obstructive pulmonary disease) ([place]) 03/01/2015  . Chronic diastolic CHF (congestive heart failure) ([place]) 03/01/2015  . Hypothyroidism 03/01/2015  . OSA on CPAP 03/01/2015  . Acute posthemorrhagic anemia 03/01/2015  . Dysphagia   . CHF (congestive heart failure) ([place])   . Acute respiratory failure with hypoxia ([place]) 01/26/2014  . Encounter for imaging study to confirm orogastric (OG) tube placement   . Respiratory failure ([place])   . 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 aortic valve relaced   2008  . BACK SURGERY     AS CHILD  . BREAST SURGERY  September 25, 2011   intraductal papilloma of the left breast  . CHOLECYSTECTOMY  2007  . COLONOSCOPY WITH PROPOFOL N/A 08/04/2014   Procedure: COLONOSCOPY WITH PROPOFOL;  Surgeon: Manya Silvas, MD;  Location: [facility] ENDOSCOPY;  Service: Endoscopy;  Laterality: N/A;  . ESOPHAGOGASTRODUODENOSCOPY Left 03/03/2015   Procedure: ESOPHAGOGASTRODUODENOSCOPY (EGD);  Surgeon: Manya Silvas, MD;  Location: [facility] ENDOSCOPY;  Service: Endoscopy;  Laterality: Left;  . ESOPHAGOGASTRODUODENOSCOPY (EGD) WITH PROPOFOL  08/04/2014   Procedure: ESOPHAGOGASTRODUODENOSCOPY (EGD) WITH PROPOFOL;  Surgeon: Manya Silvas, MD;  Location: [facility] ENDOSCOPY;  Service: Endoscopy;;  . ESOPHAGOGASTRODUODENOSCOPY (EGD) WITH PROPOFOL N/A 04/09/2016   Procedure: ESOPHAGOGASTRODUODENOSCOPY (EGD) WITH PROPOFOL;  Surgeon: Lucilla Lame, MD;  Location: [facility] ENDOSCOPY;  Service: Endoscopy;  Laterality: N/A;  . PHRENIC NERVE  PACEMAKER IMPLANTATION  2009  . TRACHEOSTOMY TUBE PLACEMENT N/A 02/04/2014   Procedure: TRACHEOSTOMY;  Surgeon: Rozetta Nunnery, MD;  Location: [place];  Service: ENT;  Laterality: N/A;    Prior to Admission medications   Medication Sig Start Date End Date Taking? Authorizing Provider  acetaminophen (TYLENOL) 500 MG tablet Take 1,000 mg  by mouth every 6 (six) hours as needed for mild pain or headache.    [provider]  albuterol (PROVENTIL HFA;VENTOLIN HFA) 108 (90 BASE) MCG/ACT inhaler Inhale 2 puffs into the lungs every 6 (six) hours as needed for wheezing or shortness of breath.     [provider]  amoxicillin (AMOXIL) 500 MG capsule Take 1 capsule by mouth as needed for other. Takes prior to dental procedures 03/07/16   [provider]  atorvastatin (LIPITOR) 10 MG tablet Take 10 mg by mouth daily at 6 PM.    [provider]  Calcium Carbonate-Vitamin D (CALCIUM 600+D) 600-400 MG-UNIT tablet Take 1 tablet by mouth at bedtime.    [provider]  cholecalciferol (VITAMIN D) 1000 units tablet Take 1,000 Units by mouth daily.    [provider]  citalopram (CELEXA) 40 MG tablet Take 40 mg by mouth daily.    [provider]  EPINEPHrine (EPIPEN 2-PAK) 0.3 mg/0.3 mL IJ SOAJ injection Inject 0.3 mg into the muscle once as needed (for severe allergic reaction).    [provider]  fluticasone (FLONASE) 50 MCG/ACT nasal spray Place 2 sprays into both nostrils daily as needed for rhinitis.     [provider]  gabapentin (NEURONTIN) 600 MG tablet Take 600 mg by mouth 4 (four) times daily.     [provider]  levothyroxine (SYNTHROID, LEVOTHROID) 100 MCG tablet Take 100 mcg by mouth daily before breakfast.     [provider]  loratadine (CLARITIN) 10 MG tablet Take 10 mg by mouth daily.    [provider]  metFORMIN (GLUCOPHAGE-XR) 500 MG 24 hr tablet Take 500 mg by mouth 2 (two) times daily.     [provider]  methocarbamol (ROBAXIN) 500 MG tablet Take 1 tablet (500 mg total) by mouth 4 (four) times daily. 09/24/16   Cuthriell, Charline Bills, PA-C  montelukast (SINGULAIR) 10 MG tablet Take 10 mg by mouth at bedtime.    [provider]  Oxycodone HCl 10 MG TABS Take 10 mg by mouth 3 (three) times daily as needed  for severe pain.     [provider]  sucralfate (CARAFATE) 1 g tablet Take 1 tablet (1 g total) by mouth 4 (four) times daily -  with meals and at bedtime. 03/05/15   Theodoro Grist, MD  traZODone (DESYREL) 50 MG tablet Take 50 mg by mouth at bedtime.     [provider]    Allergies Furosemide; Mold extract [trichophyton]; Tetanus toxoids; Qvar [beclomethasone]; Toprol xl [metoprolol tartrate]; Hctz [hydrochlorothiazide]; Nickel; Nitroglycerin; and Other  Family History  Problem Relation Age of Onset  . Skin telangiectasia Mother   . Lung cancer Maternal Grandmother   . Breast cancer Maternal Grandmother   . Lung cancer Paternal Grandmother     Social History Social History   Tobacco Use  . Smoking status: Former Research scientist (life sciences)  . Smokeless tobacco: Never Used  Substance Use Topics  . Alcohol use: No  . Drug use: No    Review of Systems  Constitutional: Positive for generalized weakness.  No fever/chills. Eyes: No visual changes. ENT: No sore throat.  Cardiovascular: Denies chest pain. Respiratory: Denies shortness of breath. Gastrointestinal: No abdominal pain.  No nausea, no vomiting.  No diarrhea.  No constipation. Genitourinary: Negative for dysuria. Musculoskeletal: Negative for back pain. Skin: Negative for rash. Neurological: Positive for lightheadedness and dizziness.  Negative for headaches, focal weakness or numbness.   ____________________________________________   PHYSICAL EXAM:  VITAL SIGNS: ED Triage Vitals  Enc Vitals Group     BP 01/13/17 1809 (!) 118/50     Pulse Rate 01/13/17 1809 62     Resp 01/13/17 1809 16     Temp 01/13/17 1809 98.5 F (36.9 C)     Temp Source 01/13/17 1809 Oral     SpO2 01/13/17 1809 95 %     Weight 01/13/17 1805 (!) 320 lb (145.2 kg)     Height 01/13/17 1805 5' 7.5" (1.715 m)     Head Circumference --      Peak Flow --      Pain Score 01/13/17 1805 4     Pain Loc --      Pain Edu? --      Excl. in [place]? --      Constitutional: Alert and oriented. Well appearing and in no acute distress. Eyes: Conjunctivae are normal. PERRL. EOMI. Head: Atraumatic. Nose: No congestion/rhinnorhea. Mouth/Throat: Mucous membranes are moist.  Oropharynx non-erythematous. Neck: No stridor.  No cervical spine tenderness to palpation.  No carotid bruits. Cardiovascular: Normal rate, regular rhythm. Grossly normal heart sounds.  Good peripheral circulation. Respiratory: Normal respiratory effort.  No retractions. Lungs CTAB. Gastrointestinal: Obese.  Soft and nontender. No distention. No abdominal bruits. No CVA tenderness. Musculoskeletal: No lower extremity tenderness nor edema.  No joint effusions. Neurologic:  Normal speech and language. No gross focal neurologic deficits are appreciated. No gait instability. Skin:  Skin is warm, dry and intact. No rash noted. Psychiatric: Mood and affect are normal. Speech and behavior are normal.  ____________________________________________   LABS (all labs ordered are listed, but only abnormal results are displayed)  Labs Reviewed  BASIC METABOLIC PANEL - Abnormal; Notable for the following components:      Result Value   Chloride 97 (*)    CO2 33 (*)    Glucose, Bld 119 (*)    BUN 28 (*)    All other components within normal limits  CBC - Abnormal; Notable for the following components:   Platelets 98 (*)    All other components within normal limits  URINALYSIS, COMPLETE (UACMP) WITH MICROSCOPIC - Abnormal; Notable for the following components:   Color, Urine YELLOW (*)    APPearance CLEAR (*)    Leukocytes, UA MODERATE (*)    Squamous Epithelial / LPF 0-5 (*)    All other components within normal limits  GLUCOSE, CAPILLARY - Abnormal; Notable for the following components:   Glucose-Capillary 125 (*)    All other components within normal limits  TROPONIN I  TROPONIN I   ____________________________________________  EKG  ED ECG REPORT I, SUNG,JADE J, the  attending physician, personally viewed and interpreted this ECG.   Date: 01/13/2017  EKG Time: 1804  Rate: 63  Rhythm: normal EKG, normal sinus rhythm  Axis: LAD  Intervals:left anterior fascicular block  ST&T Change: Nonspecific  ____________________________________________  RADIOLOGY  Dg Chest 2 View  Result Date: 01/13/2017 CLINICAL DATA:  Seizure.  Chest pain. EXAM: CHEST  2 VIEW COMPARISON:  CT scan dated 09/24/2016 and chest x-ray dated 09/18/2015 FINDINGS: Heart size and pulmonary vascularity are normal. Aortic valve  replacement. Pacemaker in place. The lungs are clear. No effusions. No significant bone abnormality. IMPRESSION: No active cardiopulmonary disease. Electronically Signed   By: Lorriane Shire M.D.   On: 01/13/2017 19:00   Ct Head Wo Contrast  Result Date: 01/14/2017 CLINICAL DATA:  Weakness and dizziness.  Lightheaded. EXAM: CT HEAD WITHOUT CONTRAST TECHNIQUE: Contiguous axial images were obtained from the base of the skull through the vertex without intravenous contrast. COMPARISON:  Head CT 09/18/2015 FINDINGS: Brain: No intracranial hemorrhage, mass effect, or midline shift. No hydrocephalus. The basilar cisterns are patent. No evidence of territorial infarct or acute ischemia. No extra-axial or intracranial fluid collection. Vascular: No hyperdense vessel or unexpected calcification. Skull: No fracture or focal lesion. Sinuses/Orbits: Paranasal sinuses and mastoid air cells are clear. The visualized orbits are unremarkable. Other: None. IMPRESSION: Unremarkable noncontrast head CT. Electronically Signed   By: Jeb Levering M.D.   On: 01/14/2017 00:03    ____________________________________________   PROCEDURES  Procedure(s) performed: None  Procedures  Critical Care performed: No  ____________________________________________   INITIAL IMPRESSION / ASSESSMENT AND PLAN / ED COURSE  As part of my medical decision making, I reviewed the following data  within the [place] notes reviewed and incorporated, Labs reviewed, EKG interpreted, Old chart reviewed, Radiograph reviewed and Notes from prior ED visits.   61 year old female with diabetes, CHF, COPD who presents with lightheadedness/dizziness. Differential diagnosis includes, but is not limited to, ACS, aortic dissection, pulmonary embolism, cardiac tamponade, pneumothorax, pneumonia, pericarditis, myocarditis, GI-related causes including esophagitis/gastritis, and musculoskeletal chest wall pain.    Without intervention, patient's symptoms have resolved.  She has had 2 sets of negative troponins.  Currently awaiting urinalysis.  Will administer judicious IV fluids for mild dehydration.  Patient hungry and asking for something to eat.  Clinical Course as of Jan 15 131  Tue Jan 14, 2017  0132 Updated patient on urinalysis result.  Will discharge home with antibiotic prescription and she will follow-up closely with her PCP this week.  Strict return precautions given.  Patient verbalizes understanding and agrees with plan of care.  [JS]    Clinical Course User Index [JS] Paulette Blanch, MD     ____________________________________________   FINAL CLINICAL IMPRESSION(S) / ED DIAGNOSES  Final diagnoses:  Dehydration  Dizziness  Lower urinary tract infectious disease     ED Discharge Orders    None       Note:  This document was prepared using Dragon voice recognition software and may include unintentional dictation errors.    Paulette Blanch, MD 01/14/17 [phone]

## 2017-01-13 NOTE — ED Notes (Signed)
Patient transported to X-ray 

## 2017-01-13 NOTE — ED Triage Notes (Signed)
Patient reports while she was driving earlier she started feeling very weak, lightheaded and states "felt like I was drunk" A&O x4 in triage.

## 2017-01-13 NOTE — ED Notes (Signed)
This EDT attempted to obtain orthostatic vitals however the pt is in transport to obtain images. I will re-attempt when pt returns to the unit

## 2017-01-14 LAB — URINALYSIS, COMPLETE (UACMP) WITH MICROSCOPIC
BILIRUBIN URINE: NEGATIVE
Bacteria, UA: NONE SEEN
Glucose, UA: NEGATIVE mg/dL
HGB URINE DIPSTICK: NEGATIVE
KETONES UR: NEGATIVE mg/dL
Nitrite: NEGATIVE
PROTEIN: NEGATIVE mg/dL
Specific Gravity, Urine: 1.019 (ref 1.005–1.030)
pH: 5 (ref 5.0–8.0)

## 2017-01-14 LAB — GLUCOSE, CAPILLARY: GLUCOSE-CAPILLARY: 125 mg/dL — AB (ref 65–99)

## 2017-01-14 MED ORDER — CEPHALEXIN 500 MG PO CAPS
500.0000 mg | ORAL_CAPSULE | Freq: Once | ORAL | Status: AC
  Administered 2017-01-14: 500 mg via ORAL
  Filled 2017-01-14: qty 1

## 2017-01-14 MED ORDER — CEPHALEXIN 500 MG PO CAPS: 500.0000 mg | ORAL_CAPSULE | Freq: Three times a day (TID) | ORAL | 0 refills | Status: DC

## 2017-01-14 NOTE — ED Notes (Signed)
This EDT completed orthostatic vitals. Pt did not feel dizzy, or lightheaded at all during my time working with her. Pt was hesitant of standing due to the events that transpired earlier, however pt's gait was stable standing with normal sway noted. Pt resting on the bedside at this time. Pt assisted back into bed. Pt currently feels fine with no change in mental/physical state. Rn notified.

## 2017-01-14 NOTE — Discharge Instructions (Signed)
1.  Take antibiotic as prescribed (Keflex 500 mg 3 times daily x 7 days). 2.  Drink plenty of fluids daily. 3.  Return to the ER for worsening symptoms, persistent vomiting, difficulty breathing or other concerns.

## 2017-03-05 ENCOUNTER — Encounter: Payer: Self-pay | Admitting: Emergency Medicine

## 2017-03-05 ENCOUNTER — Emergency Department: Payer: Medicare HMO

## 2017-03-05 ENCOUNTER — Observation Stay
Admission: EM | Admit: 2017-03-05 | Discharge: 2017-03-06 | Disposition: A | Discharge status: Home / Self Care | Payer: Medicare HMO | Attending: Internal Medicine | Admitting: Internal Medicine

## 2017-03-05 DIAGNOSIS — Z9049 Acquired absence of other specified parts of digestive tract: Secondary | ICD-10-CM | POA: Insufficient documentation

## 2017-03-05 DIAGNOSIS — Z794 Long term (current) use of insulin: Secondary | ICD-10-CM | POA: Insufficient documentation

## 2017-03-05 DIAGNOSIS — Z9989 Dependence on other enabling machines and devices: Secondary | ICD-10-CM

## 2017-03-05 DIAGNOSIS — I130 Hypertensive heart and chronic kidney disease with heart failure and stage 1 through stage 4 chronic kidney disease, or unspecified chronic kidney disease: Secondary | ICD-10-CM | POA: Diagnosis not present

## 2017-03-05 DIAGNOSIS — E1165 Type 2 diabetes mellitus with hyperglycemia: Secondary | ICD-10-CM | POA: Diagnosis not present

## 2017-03-05 DIAGNOSIS — Z79899 Other long term (current) drug therapy: Secondary | ICD-10-CM | POA: Insufficient documentation

## 2017-03-05 DIAGNOSIS — R079 Chest pain, unspecified: Principal | ICD-10-CM | POA: Insufficient documentation

## 2017-03-05 DIAGNOSIS — N189 Chronic kidney disease, unspecified: Secondary | ICD-10-CM | POA: Insufficient documentation

## 2017-03-05 DIAGNOSIS — Z87891 Personal history of nicotine dependence: Secondary | ICD-10-CM | POA: Diagnosis not present

## 2017-03-05 DIAGNOSIS — Z953 Presence of xenogenic heart valve: Secondary | ICD-10-CM | POA: Insufficient documentation

## 2017-03-05 DIAGNOSIS — Z6841 Body Mass Index (BMI) 40.0 and over, adult: Secondary | ICD-10-CM | POA: Diagnosis not present

## 2017-03-05 DIAGNOSIS — K219 Gastro-esophageal reflux disease without esophagitis: Secondary | ICD-10-CM | POA: Diagnosis present

## 2017-03-05 DIAGNOSIS — J449 Chronic obstructive pulmonary disease, unspecified: Secondary | ICD-10-CM | POA: Diagnosis present

## 2017-03-05 DIAGNOSIS — R0602 Shortness of breath: Secondary | ICD-10-CM | POA: Diagnosis not present

## 2017-03-05 DIAGNOSIS — E785 Hyperlipidemia, unspecified: Secondary | ICD-10-CM | POA: Diagnosis not present

## 2017-03-05 DIAGNOSIS — G4733 Obstructive sleep apnea (adult) (pediatric): Secondary | ICD-10-CM

## 2017-03-05 DIAGNOSIS — E039 Hypothyroidism, unspecified: Secondary | ICD-10-CM | POA: Diagnosis not present

## 2017-03-05 DIAGNOSIS — Z79890 Hormone replacement therapy (postmenopausal): Secondary | ICD-10-CM | POA: Diagnosis not present

## 2017-03-05 DIAGNOSIS — Z950 Presence of cardiac pacemaker: Secondary | ICD-10-CM | POA: Diagnosis not present

## 2017-03-05 DIAGNOSIS — R55 Syncope and collapse: Secondary | ICD-10-CM | POA: Insufficient documentation

## 2017-03-05 DIAGNOSIS — I5032 Chronic diastolic (congestive) heart failure: Secondary | ICD-10-CM | POA: Diagnosis present

## 2017-03-05 DIAGNOSIS — E119 Type 2 diabetes mellitus without complications: Secondary | ICD-10-CM

## 2017-03-05 DIAGNOSIS — Z7984 Long term (current) use of oral hypoglycemic drugs: Secondary | ICD-10-CM | POA: Insufficient documentation

## 2017-03-05 DIAGNOSIS — E1122 Type 2 diabetes mellitus with diabetic chronic kidney disease: Secondary | ICD-10-CM | POA: Insufficient documentation

## 2017-03-05 LAB — CBC
HCT: 43.5 % (ref 35.0–47.0)
Hemoglobin: 14.5 g/dL (ref 12.0–16.0)
MCH: 30 pg (ref 26.0–34.0)
MCHC: 33.4 g/dL (ref 32.0–36.0)
MCV: 89.9 fL (ref 80.0–100.0)
PLATELETS: 112 10*3/uL — AB (ref 150–440)
RBC: 4.84 MIL/uL (ref 3.80–5.20)
RDW: 14 % (ref 11.5–14.5)
WBC: 11.1 10*3/uL — ABNORMAL HIGH (ref 3.6–11.0)

## 2017-03-05 LAB — BASIC METABOLIC PANEL
Anion gap: 10 (ref 5–15)
BUN: 23 mg/dL — AB (ref 6–20)
CO2: 28 mmol/L (ref 22–32)
CREATININE: 0.87 mg/dL (ref 0.44–1.00)
Calcium: 9.5 mg/dL (ref 8.9–10.3)
Chloride: 100 mmol/L — ABNORMAL LOW (ref 101–111)
Glucose, Bld: 224 mg/dL — ABNORMAL HIGH (ref 65–99)
Potassium: 4.6 mmol/L (ref 3.5–5.1)
SODIUM: 138 mmol/L (ref 135–145)

## 2017-03-05 LAB — GLUCOSE, CAPILLARY
GLUCOSE-CAPILLARY: 228 mg/dL — AB (ref 65–99)
GLUCOSE-CAPILLARY: 270 mg/dL — AB (ref 65–99)

## 2017-03-05 LAB — TROPONIN I: Troponin I: <0.03 ng/mL (ref <0.03)

## 2017-03-05 MED ORDER — ONDANSETRON HCL 4 MG/2ML IJ SOLN: 4.0000 mg | Freq: Four times a day (QID) | INTRAMUSCULAR | Status: DC | PRN

## 2017-03-05 MED ORDER — CITALOPRAM HYDROBROMIDE 20 MG PO TABS
40.0000 mg | ORAL_TABLET | Freq: Every day | ORAL | Status: DC
  Administered 2017-03-06: 40 mg via ORAL
  Filled 2017-03-05: qty 2

## 2017-03-05 MED ORDER — SUCRALFATE 1 G PO TABS
1.0000 g | ORAL_TABLET | Freq: Three times a day (TID) | ORAL | Status: DC
  Administered 2017-03-06 (×3): 1 g via ORAL
  Filled 2017-03-05 (×3): qty 1

## 2017-03-05 MED ORDER — ENOXAPARIN SODIUM 40 MG/0.4ML ~~LOC~~ SOLN
40.0000 mg | Freq: Two times a day (BID) | SUBCUTANEOUS | Status: DC
  Administered 2017-03-06: 40 mg via SUBCUTANEOUS
  Filled 2017-03-05: qty 0.4

## 2017-03-05 MED ORDER — ONDANSETRON HCL 4 MG PO TABS: 4.0000 mg | ORAL_TABLET | Freq: Four times a day (QID) | ORAL | Status: DC | PRN

## 2017-03-05 MED ORDER — TRAZODONE HCL 50 MG PO TABS
50.0000 mg | ORAL_TABLET | Freq: Every day | ORAL | Status: DC
  Administered 2017-03-05: 50 mg via ORAL
  Filled 2017-03-05: qty 1

## 2017-03-05 MED ORDER — ASPIRIN 81 MG PO CHEW
324.0000 mg | CHEWABLE_TABLET | Freq: Once | ORAL | Status: AC
  Administered 2017-03-05: 324 mg via ORAL
  Filled 2017-03-05: qty 4

## 2017-03-05 MED ORDER — INSULIN ASPART 100 UNIT/ML ~~LOC~~ SOLN
0.0000 [IU] | Freq: Four times a day (QID) | SUBCUTANEOUS | Status: DC
  Administered 2017-03-05 – 2017-03-06 (×2): 5 [IU] via SUBCUTANEOUS
  Administered 2017-03-06: 2 [IU] via SUBCUTANEOUS
  Administered 2017-03-06: 3 [IU] via SUBCUTANEOUS
  Filled 2017-03-05 (×4): qty 1

## 2017-03-05 MED ORDER — LEVOTHYROXINE SODIUM 100 MCG PO TABS
100.0000 ug | ORAL_TABLET | Freq: Every day | ORAL | Status: DC
  Administered 2017-03-06: 100 ug via ORAL
  Filled 2017-03-05: qty 1

## 2017-03-05 MED ORDER — GABAPENTIN 600 MG PO TABS
600.0000 mg | ORAL_TABLET | Freq: Four times a day (QID) | ORAL | Status: DC
  Administered 2017-03-05 – 2017-03-06 (×4): 600 mg via ORAL
  Filled 2017-03-05 (×4): qty 1

## 2017-03-05 MED ORDER — MONTELUKAST SODIUM 10 MG PO TABS
10.0000 mg | ORAL_TABLET | Freq: Every day | ORAL | Status: DC
  Administered 2017-03-05: 10 mg via ORAL
  Filled 2017-03-05: qty 1

## 2017-03-05 MED ORDER — OXYCODONE HCL 5 MG PO TABS
10.0000 mg | ORAL_TABLET | Freq: Three times a day (TID) | ORAL | Status: DC | PRN
  Administered 2017-03-05 – 2017-03-06 (×2): 10 mg via ORAL
  Filled 2017-03-05 (×2): qty 2

## 2017-03-05 MED ORDER — ATORVASTATIN CALCIUM 10 MG PO TABS: 10.0000 mg | ORAL_TABLET | Freq: Every day | ORAL | Status: DC

## 2017-03-05 MED ORDER — ACETAMINOPHEN 650 MG RE SUPP: 650.0000 mg | Freq: Four times a day (QID) | RECTAL | Status: DC | PRN

## 2017-03-05 MED ORDER — ACETAMINOPHEN 325 MG PO TABS
650.0000 mg | ORAL_TABLET | Freq: Four times a day (QID) | ORAL | Status: DC | PRN
  Administered 2017-03-06: 650 mg via ORAL
  Filled 2017-03-05: qty 2

## 2017-03-05 NOTE — Progress Notes (Signed)
Lovenox changed to 40 mg BID for CrCl >30 and BMI >40.

## 2017-03-05 NOTE — ED Triage Notes (Signed)
Pt comes into the ED via [facility] c/o hyperglycemia, shortness of breath, and chest pain.  Patient had injections in her knees yesterday and the orthopedist believes these may be a reaction to the injections.  Patient states all the issues began yesterday.  Denies any N/V or fevers at home.  Patient has even and unlabored respirations at this time.  Patient in NAD with warm and dry skin.

## 2017-03-05 NOTE — ED Notes (Signed)
Admitting MD at bedside.

## 2017-03-05 NOTE — ED Provider Notes (Signed)
[facility] Emergency Department Provider Note   ____________________________________________   First MD Initiated Contact with Patient 03/05/17 1952     (approximate)  I have reviewed the triage vital signs and the nursing notes.   HISTORY  Chief Complaint Hyperglycemia; Chest Pain; and Shortness of Breath    HPI Theresa Leon is a 62 y.o. female Who reports she had injections in her knees yesterday she thinks they were steroid injections. She says her sugars gone up usually eats about 80 and it was 220 some. She is feeling short of breath and chest pain. She reports she had an episode of near-syncope this morning when she had eaten was turning around to wash the dishes and got woozy and blacked out for second without falling down. At the doctor's office today she was walking down the hallway and got sweaty and short of breath and chest tightness and felt like she was going to pass out. She has a history of a aortic valve replacement with it [place] all in 2008. She has a pacemaker in place as well. Initial troponin was negative. She still having a little bit of chest tightness. It feels heavy.   Past Medical History:  Diagnosis Date  . Acute on chronic renal failure ([place])   . Anemia   . Asthma 1975  . COPD (chronic obstructive pulmonary disease) ([place])   . Diabetes ([place])   . Diabetes mellitus without complication ([place]) 6314  . Diastolic CHF, acute on chronic (HCC)   . H/O aortic valve replacement   . Heart disease   . Hypothyroid   . Morbid obesity ([place])   . 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 OSA (obstructive sleep apnea)     Patient Active Problem List   Diagnosis Date Noted  . Chest pain 03/05/2017  . V tach ([place]) 09/18/2015  . Iron deficiency anemia due to chronic blood loss 05/03/2015  . Gastritis due to nonsteroidal anti-inflammatory drug (NSAID) 03/05/2015  . History of esophagogastroduodenoscopy  (EGD) 03/05/2015  . Type 2 diabetes mellitus ([place]) 03/01/2015  . GERD (gastroesophageal reflux disease) 03/01/2015  . HLD (hyperlipidemia) 03/01/2015  . COPD (chronic obstructive pulmonary disease) ([place]) 03/01/2015  . Chronic diastolic CHF (congestive heart failure) ([place]) 03/01/2015  . Hypothyroidism 03/01/2015  . OSA on CPAP 03/01/2015  . Acute posthemorrhagic anemia 03/01/2015  . Dysphagia   . CHF (congestive heart failure) ([place])   . Acute respiratory failure with hypoxia ([place]) 01/26/2014  . Encounter for imaging study to confirm orogastric (OG) tube placement   . Respiratory failure ([place])   . 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 aortic valve relaced   2008  . BACK SURGERY     AS CHILD  . BREAST SURGERY  September 25, 2011   intraductal papilloma of the left breast  . CHOLECYSTECTOMY  2007  . COLONOSCOPY WITH PROPOFOL N/A 08/04/2014   Procedure: COLONOSCOPY WITH PROPOFOL;  Surgeon: Manya Silvas, MD;  Location: [facility] ENDOSCOPY;  Service: Endoscopy;  Laterality: N/A;  . ESOPHAGOGASTRODUODENOSCOPY Left 03/03/2015   Procedure: ESOPHAGOGASTRODUODENOSCOPY (EGD);  Surgeon: Manya Silvas, MD;  Location[facility] ENDOSCOPY;  Service: Endoscopy;  Laterality: Left;  . ESOPHAGOGASTRODUODENOSCOPY (EGD) WITH PROPOFOL  08/04/2014   Procedure: ESOPHAGOGASTRODUODENOSCOPY (EGD) WITH PROPOFOL;  Surgeon: Manya Silvas, MD;  Location: [facility] ENDOSCOPY;  Service: Endoscopy;;  . ESOPHAGOGASTRODUODENOSCOPY (EGD) WITH PROPOFOL N/A 04/09/2016  Procedure: ESOPHAGOGASTRODUODENOSCOPY (EGD) WITH PROPOFOL;  Surgeon: Lucilla Lame, MD;  Location: [facility] ENDOSCOPY;  Service: Endoscopy;  Laterality: N/A;  . PHRENIC NERVE PACEMAKER IMPLANTATION  2009  . TRACHEOSTOMY TUBE PLACEMENT N/A 02/04/2014   Procedure: TRACHEOSTOMY;  Surgeon: Rozetta Nunnery, MD;  Location: [place] [place];  Service: ENT;  Laterality: N/A;    Prior to Admission medications   Medication  Sig Start Date End Date Taking? Authorizing Provider  acetaminophen (TYLENOL) 500 MG tablet Take 1,000 mg by mouth every 6 (six) hours as needed for mild pain or headache.   Yes [provider]  albuterol (PROVENTIL HFA;VENTOLIN HFA) 108 (90 BASE) MCG/ACT inhaler Inhale 2 puffs into the lungs every 6 (six) hours as needed for wheezing or shortness of breath.    Yes [provider]  Calcium Carbonate-Vitamin D (CALCIUM 600+D) 600-400 MG-UNIT tablet Take 1 tablet by mouth at bedtime.   Yes [provider]  citalopram (CELEXA) 40 MG tablet Take 40 mg by mouth daily.   Yes [provider]  EPINEPHrine (EPIPEN 2-PAK) 0.3 mg/0.3 mL IJ SOAJ injection Inject 0.3 mg into the muscle once as needed (for severe allergic reaction).   Yes [provider]  gabapentin (NEURONTIN) 600 MG tablet Take 600 mg by mouth 4 (four) times daily.    Yes [provider]  levothyroxine (SYNTHROID, LEVOTHROID) 125 MCG tablet Take 100 mcg by mouth daily before breakfast.    Yes [provider]  loratadine (CLARITIN) 10 MG tablet Take 10 mg by mouth daily.   Yes [provider]  metFORMIN (GLUCOPHAGE-XR) 500 MG 24 hr tablet Take 500 mg by mouth 2 (two) times daily.    Yes [provider]  montelukast (SINGULAIR) 10 MG tablet Take 10 mg by mouth at bedtime.   Yes [provider]  Oxycodone HCl 10 MG TABS Take 10 mg by mouth 3 (three) times daily as needed for severe pain.    Yes [provider]  sucralfate (CARAFATE) 1 g tablet Take 1 tablet (1 g total) by mouth 4 (four) times daily -  with meals and at bedtime. 03/05/15  Yes Theodoro Grist, MD  traZODone (DESYREL) 50 MG tablet Take 50 mg by mouth at bedtime.    Yes [provider]  Vitamin D, Ergocalciferol, (DRISDOL) 50000 units CAPS capsule Take 50,000 Units by mouth every 7 (seven) days. Fridays   Yes [provider]  amoxicillin (AMOXIL) 500 MG capsule Take 1  capsule by mouth as needed for other. Takes prior to dental procedures 03/07/16   [provider]  cephALEXin (KEFLEX) 500 MG capsule Take 1 capsule (500 mg total) by mouth 3 (three) times daily. Patient not taking: Reported on 03/05/2017 01/14/17   Paulette Blanch, MD  cholecalciferol (VITAMIN D) 1000 units tablet Take 1,000 Units by mouth daily.    [provider]  fluticasone (FLONASE) 50 MCG/ACT nasal spray Place 2 sprays into both nostrils daily as needed for rhinitis.     [provider]    Allergies Furosemide; Mold extract [trichophyton]; Tetanus toxoids; Qvar [beclomethasone]; Toprol xl [metoprolol tartrate]; Hctz [hydrochlorothiazide]; Nickel; Nitroglycerin; and Other  Family History  Problem Relation Age of Onset  . Skin telangiectasia Mother   . Lung cancer Maternal Grandmother   . Breast cancer Maternal Grandmother   . Lung cancer Paternal Grandmother     Social History Social History   Tobacco Use  . Smoking status: Former Research scientist (life sciences)  . Smokeless tobacco: Never Used  Substance Use  Topics  . Alcohol use: No  . Drug use: No    Review of Systems  Constitutional: No fever/chills Eyes: No visual changes. ENT: No sore throat. Cardiovascular: see history of present illness Respiratory: see history of present illness Gastrointestinal: No abdominal pain.  No nausea, no vomiting.  No diarrhea.  No constipation. Genitourinary: Negative for dysuria. Musculoskeletal: Negative for back pain. Skin: Negative for rash. Neurological: Negative for headaches, focal weakness   ____________________________________________   PHYSICAL EXAM:  VITAL SIGNS: ED Triage Vitals  Enc Vitals Group     BP 03/05/17 1541 123/63     Pulse Rate 03/05/17 1541 61     Resp 03/05/17 1541 20     Temp 03/05/17 1541 98.3 F (36.8 C)     Temp Source 03/05/17 1541 Oral     SpO2 03/05/17 1541 95 %     Weight 03/05/17 1541 (!) 325 lb (147.4 kg)     Height 03/05/17 1541 5' 7"   (1.702 m)     Head Circumference --      Peak Flow --      Pain Score 03/05/17 1547 5     Pain Loc --      Pain Edu? --      Excl. in [place]? --     Constitutional: Alert and oriented. Well appearing and in no acute distress. Eyes: Conjunctivae are normal.  Head: Atraumatic. Nose: No congestion/rhinnorhea. Mouth/Throat: Mucous membranes are moist.  Oropharynx non-erythematous. Neck: No stridor.  Cardiovascular: Normal rate, regular rhythm. Grossly normal heart sounds.  Good peripheral circulation. Respiratory: Normal respiratory effort.  No retractions. Lungs CTAB. Gastrointestinal: Soft and nontender. No distention. No abdominal bruits. No CVA tenderness. Musculoskeletal: No lower extremity tenderness slight edema.  No joint effusions. Neurologic:  Normal speech and language. No gross focal neurologic deficits are appreciated Skin:  Skin is warm, dry and intact. No rash noted. Psychiatric: Mood and affect are normal. Speech and behavior are normal.  ____________________________________________   LABS (all labs ordered are listed, but only abnormal results are displayed)  Labs Reviewed  BASIC METABOLIC PANEL - Abnormal; Notable for the following components:      Result Value   Chloride 100 (*)    Glucose, Bld 224 (*)    BUN 23 (*)    All other components within normal limits  CBC - Abnormal; Notable for the following components:   WBC 11.1 (*)    Platelets 112 (*)    All other components within normal limits  GLUCOSE, CAPILLARY - Abnormal; Notable for the following components:   Glucose-Capillary 228 (*)    All other components within normal limits  GLUCOSE, CAPILLARY - Abnormal; Notable for the following components:   Glucose-Capillary 270 (*)    All other components within normal limits  TROPONIN I  TROPONIN I  HIV ANTIBODY (ROUTINE TESTING)  TROPONIN I  BASIC METABOLIC PANEL  CBC   ____________________________________________  EKG  EKG read and interpreted by me  shows normal sinus rhythm rate of 67 left axis no acute ST-T wave changes no change from 01/13/2017. ____________________________________________  RADIOLOGY  ED MD interpretation: chest x-ray shows no acute disease I agree with the radiologist reading.  Official radiology report(s): Dg Chest 2 View  Result Date: 03/05/2017 CLINICAL DATA:  Shortness of breath and diaphoresis with chest pain. EXAM: CHEST  2 VIEW COMPARISON:  01/13/2017 FINDINGS: The cardiac silhouette, mediastinal and hilar contours are within normal limits and stable. There is tortuosity of the thoracic aorta. Stable  surgical changes from valve replacement surgery. The pacer wires are stable. Chronic bronchitic type lung changes but no infiltrates, edema or effusions. The bony thorax is intact. IMPRESSION: No acute cardiopulmonary findings. Electronically Signed   By: Marijo Sanes M.D.   On: 03/05/2017 16:59    ____________________________________________   PROCEDURES  Procedure(s) performed:   Procedures  Critical Care performed:   ____________________________________________   INITIAL IMPRESSION / ASSESSMENT AND PLAN / ED COURSE  patient has a history of aortic valve replacement with bovine valve in 2008 11 years ago. She has a pacemaker which we cannot interrogate. She has had symptoms suggestive of angina with the chest heaviness and sweating shortness of breath etc. while she was walking. He did improve while she rested. She also has numerous risk factors for heart disease including obesity diabetes hypertensionarthritis and hyperlipidemia. She is still having a little bit of chest tightness. I will get her in the hospital and we can evaluate her and her involve in her pacemaker or thoroughly.         ____________________________________________   FINAL CLINICAL IMPRESSION(S) / ED DIAGNOSES  Final diagnoses:  Chest pain, unspecified type     ED Discharge Orders    None       Note:  This  document was prepared using Dragon voice recognition software and may include unintentional dictation errors.    Nena Polio, MD 03/05/17 [phone]

## 2017-03-06 ENCOUNTER — Observation Stay
Admit: 2017-03-06 | Discharge: 2017-03-06 | Disposition: A | Discharge status: Home / Self Care | Payer: Medicare HMO | Attending: Internal Medicine | Admitting: Internal Medicine

## 2017-03-06 ENCOUNTER — Other Ambulatory Visit: Payer: Self-pay

## 2017-03-06 ENCOUNTER — Observation Stay: Payer: Medicare HMO

## 2017-03-06 LAB — CBC
HCT: 42.4 % (ref 35.0–47.0)
Hemoglobin: 14.2 g/dL (ref 12.0–16.0)
MCH: 30.3 pg (ref 26.0–34.0)
MCHC: 33.5 g/dL (ref 32.0–36.0)
MCV: 90.4 fL (ref 80.0–100.0)
Platelets: 103 10*3/uL — ABNORMAL LOW (ref 150–440)
RBC: 4.69 MIL/uL (ref 3.80–5.20)
RDW: 14.1 % (ref 11.5–14.5)
WBC: 9.6 10*3/uL (ref 3.6–11.0)

## 2017-03-06 LAB — BASIC METABOLIC PANEL
Anion gap: 9 (ref 5–15)
BUN: 24 mg/dL — AB (ref 6–20)
CHLORIDE: 103 mmol/L (ref 101–111)
CO2: 29 mmol/L (ref 22–32)
CREATININE: 0.71 mg/dL (ref 0.44–1.00)
Calcium: 9.3 mg/dL (ref 8.9–10.3)
GFR calc Af Amer: >60 mL/min (ref >60)
GFR calc non Af Amer: >60 mL/min (ref >60)
Glucose, Bld: 179 mg/dL — ABNORMAL HIGH (ref 65–99)
Potassium: 4.6 mmol/L (ref 3.5–5.1)
Sodium: 141 mmol/L (ref 135–145)

## 2017-03-06 LAB — ECHOCARDIOGRAM COMPLETE
Height: 67 in
Weight: 5240 oz

## 2017-03-06 LAB — GLUCOSE, CAPILLARY
GLUCOSE-CAPILLARY: 172 mg/dL — AB (ref 65–99)
GLUCOSE-CAPILLARY: 165 mg/dL — AB (ref 65–99)
GLUCOSE-CAPILLARY: 221 mg/dL — AB (ref 65–99)
Glucose-Capillary: 160 mg/dL — ABNORMAL HIGH (ref 65–99)

## 2017-03-06 LAB — TROPONIN I: Troponin I: <0.03 ng/mL (ref <0.03)

## 2017-03-06 MED ORDER — ATORVASTATIN CALCIUM 20 MG PO TABS
80.0000 mg | ORAL_TABLET | Freq: Every day | ORAL | Status: DC
  Administered 2017-03-06: 80 mg via ORAL
  Filled 2017-03-06: qty 4

## 2017-03-06 MED ORDER — ATORVASTATIN CALCIUM 80 MG PO TABS: 80.0000 mg | ORAL_TABLET | Freq: Every day | ORAL | 0 refills | Status: DC

## 2017-03-06 NOTE — Progress Notes (Signed)
Inpatient Diabetes Program Recommendations  AACE/ADA: New Consensus Statement on Inpatient Glycemic Control (2015)  Target Ranges:  Prepandial:   less than 140 mg/dL      Peak postprandial:   less than 180 mg/dL (1-2 hours)      Critically ill patients:  140 - 180 mg/dL   Lab Results  Component Value Date   GLUCAP 165 (H) 03/06/2017   HGBA1C 5.7 09/18/2015    Review of Glycemic Control  Results for Theresa Leon, Theresa Leon (MRN [id number]) as of 03/06/2017 10:16  Ref. Range 03/05/2017 15:52 03/05/2017 22:53 03/06/2017 06:01 03/06/2017 08:16  Glucose-Capillary Latest Ref Range: 65 - 99 mg/dL 228 (H) 270 (H) 172 (H) 165 (H)    Diabetes history: Type 2  Outpatient Diabetes medications:  Metformin 570m bid  Current orders for Inpatient glycemic control: Novolog 0-9 units q6h  Inpatient Diabetes Program Recommendations:   Per ADA recommendations "consider performing an A1C on all patients with diabetes or hyperglycemia admitted to the hospital if not performed in the prior 3 months".  Once the patient is able to eat, change Novolog to tid and add Novolog 0-5 units qhs.    JGentry Fitz RN, BA, MHA, CDE Diabetes Coordinator Inpatient Diabetes Program  [phone](Team Pager) [phone](ADe Soto 03/06/2017 10:28 AM

## 2017-03-06 NOTE — Progress Notes (Signed)
*  PRELIMINARY RESULTS* Echocardiogram 2D Echocardiogram has been performed.  Theresa Leon 03/06/2017, 2:43 PM

## 2017-03-06 NOTE — Progress Notes (Signed)
Discharge instructions explained to pt/ verbalized an understanding/ iv and tele removed/ will transport off unit via wheelchair.  

## 2017-03-06 NOTE — Care Management Obs Status (Signed)
MEDICARE OBSERVATION STATUS NOTIFICATION   Patient Details  Name: GENASIS ZINGALE MRN: [id number] Date of Birth: Oct 12, 1955   Medicare Observation Status Notification Given:  Yes    Katrina Stack, RN 03/06/2017, 2:18 PM

## 2017-03-06 NOTE — Progress Notes (Signed)
[place] at [place] NAME: Theresa Leon    MR#:  [id number]  DATE OF BIRTH:  [birth year]  SUBJECTIVE:  admitted for chest pain across chest from right to left side associated with near syncopal episode that happened yesterday.  She denies any chest pain now.  CHIEF COMPLAINT:   Chief Complaint  Patient presents with  . Hyperglycemia  . Chest Pain  . Shortness of Breath    REVIEW OF SYSTEMS:   ROS CONSTITUTIONAL: No fever, fatigue or weakness.  EYES: No blurred or double vision.  EARS, NOSE, AND THROAT: No tinnitus or ear pain.  RESPIRATORY: No cough, shortness of breath, wheezing or hemoptysis.  CARDIOVASCULAR: No chest pain, orthopnea, edema.  GASTROINTESTINAL: No nausea, vomiting, diarrhea or abdominal pain.  GENITOURINARY: No dysuria, hematuria.  ENDOCRINE: No polyuria, nocturia,  HEMATOLOGY: No anemia, easy bruising or bleeding SKIN: No rash or lesion. MUSCULOSKELETAL: No joint pain or arthritis.   NEUROLOGIC: No tingling, numbness, weakness.  PSYCHIATRY: No anxiety or depression.   DRUG ALLERGIES:   Allergies  Allergen Reactions  . Furosemide Other (See Comments)    Pt states that this medication caused renal failure.    . Mold Extract [Trichophyton] Anaphylaxis  . Tetanus Toxoids Other (See Comments)    Pt states that her arm turns black.    [place] Crigler [Beclomethasone] Other (See Comments)    Reaction:  Thrush   . Toprol Xl [Metoprolol Tartrate] Hives  . Hctz [Hydrochlorothiazide] Other (See Comments)    Reports renal failure   . Nickel   . Nitroglycerin Other (See Comments)    Pt states that this medication makes her BP bottom out.    . Other Hives, Swelling and Other (See Comments)    Pt states that she is allergic to steroids.      VITALS:  Blood pressure (!) 119/58, pulse (!) 55, temperature 98.4 F (36.9 C), temperature source Oral, resp. rate 18, height 5' 7"  (1.702 m), weight (!) 148.6 kg (327 lb 8 oz),  SpO2 92 %.  PHYSICAL EXAMINATION:  GENERAL:  62 y.o.-year-old patient lying in the bed with no acute distress.  EYES: Pupils equal, round, reactive to light  . No scleral icterus. Extraocular muscles intact.  HEENT: Head atraumatic, normocephalic. Oropharynx and nasopharynx clear.  NECK:  Supple, no jugular venous distention. No thyroid enlargement, no tenderness.  LUNGS: Normal breath sounds bilaterally, no wheezing, rales,rhonchi or crepitation. No use of accessory muscles of respiration.  CARDIOVASCULAR: S1, S2 normal. No murmurs, rubs, or gallops.  ABDOMEN: Soft, nontender, nondistended. Bowel sounds present. No organomegaly or mass.  EXTREMITIES: No pedal edema, cyanosis, or clubbing.  NEUROLOGIC: Cranial nerves II through XII are intact. Muscle strength 5/5 in all extremities. Sensation intact. Gait not checked.  PSYCHIATRIC: The patient is alert and oriented x 3.  SKIN: No obvious rash, lesion, or ulcer.  Morbidly obese female on was on CPAP last night.  LABORATORY PANEL:   CBC Recent Labs  Lab 03/06/17 0647  WBC 9.6  HGB 14.2  HCT 42.4  PLT 103*   ------------------------------------------------------------------------------------------------------------------  Chemistries  Recent Labs  Lab 03/06/17 0647  NA 141  K 4.6  CL 103  CO2 29  GLUCOSE 179*  BUN 24*  CREATININE 0.71  CALCIUM 9.3   ------------------------------------------------------------------------------------------------------------------  Cardiac Enzymes Recent Labs  Lab 03/06/17 0212  TROPONINI <0.03   ------------------------------------------------------------------------------------------------------------------  RADIOLOGY:  Dg Chest 2 View  Result Date: 03/05/2017 CLINICAL DATA:  Shortness of  breath and diaphoresis with chest pain. EXAM: CHEST  2 VIEW COMPARISON:  01/13/2017 FINDINGS: The cardiac silhouette, mediastinal and hilar contours are within normal limits and stable. There is  tortuosity of the thoracic aorta. Stable surgical changes from valve replacement surgery. The pacer wires are stable. Chronic bronchitic type lung changes but no infiltrates, edema or effusions. The bony thorax is intact. IMPRESSION: No acute cardiopulmonary findings. Electronically Signed   By: Marijo Sanes M.D.   On: 03/05/2017 16:59    EKG:   Orders placed or performed during the hospital encounter of 03/05/17  . ED EKG within 10 minutes  . ED EKG within 10 minutes    ASSESSMENT AND PLAN:   [age] year [age] female patient with near syncopal episode, central chest pain, patient admitted to telemetry, did not have any bradycardia cardiology consult appreciated.  Patient had history of pacemaker, aortic valve replacement with bovine valve, follows up with Dr. Earlyne Iba,.  Troponins have been negative, no bradycardia.  I will get echocardiogram, ultrasound of carotids and start clear liquids.  Doubt she needs any cardiac cath.  Relayed information to Dr. [name] secretary.  : #2 chronic diastolic heart failure: Stable. 3.  COPD: No wheezing, continue home dose inhalers. Obstructive sleep apnea: Continue CPAP at night. 4.  Morbid obesity: Patient is encouraged to lose weight. 5.  Diabetes mellitus type 2: Slightly uncontrolled: Continue SSI with coverage, restart metformin from tomorrow make sure there is no cardiac intervention required.  All the records are reviewed and case discussed with Care Management/Social Workerr. Management plans discussed with the patient, family and they are in agreement.  CODE STATUS: Full code TOTAL TIME TAKING CARE OF THIS PATIENT:  18mnutes.   POSSIBLE D/C IN  1-2DAYS, DEPENDING ON CLINICAL CONDITION.   SEpifanio LeschesM.D on 03/06/2017 at 9:11 AM  Between 7am to 6pm - Pager - [phone]  After 6pm go to [email or web address] - password [email or web address]  Office  [phone] CC: Primary care physician; KPerrin Maltese MD   Note:  This dictation was prepared with Dragon dictation along with smaller phrase technology. Any transcriptional errors that result from this process are unintentional.

## 2017-03-06 NOTE — Progress Notes (Signed)
Pt arrived to floor via stretcher from ED. Pt A&O. Telemetry monitor applied and called to CCMD. Oriented to room, call bell placed, oriented to ascoms, booklet given.

## 2017-03-06 NOTE — Consult Note (Signed)
Cardiology Consultation Note    Patient ID: Theresa Leon, MRN: [id number], DOB/AGE: 1955-05-27 62 y.o. Admit date: 03/05/2017   Date of Consult: 03/06/2017 Primary Physician: Perrin Maltese, MD Primary Cardiologist: Dr. Neoma Laming  Chief Complaint: chest pain Reason for Consultation: Chest pain Requesting MD: Dr. Vianne Bulls  HPI: Theresa Leon is a 62 y.o. female with history of COPD, diastolic heart failure, history of bicuspid aortic valve with stenosis status post replacement with a 25 mm Edwards magna bioprosthetic valve done in [place] in 2008.  She had preserved LV function at the time.  She is not appear to have had any significant coronary artery disease at that time.  She was encouraged to stop smoking.  Weight loss and CPAP was used for her getting sleep apnea.  She now returns with complaints of diffuse bilateral chest discomfort.  She has ruled out for myocardial infarction.  Pain is not exertional.  EKG reveals sinus rhythm with nonspecific ST-T wave changes.  No ischemia.  Chest x-ray revealed no acute cardiopulmonary disease.  Carotid Dopplers were done due to dizziness which revealed bilateral mild less than 50% plaque in the carotid arteries.  Renal function is normal.  She is improved clinically and currently has no chest pain.  Past Medical History:  Diagnosis Date  . Acute on chronic renal failure ([place])   . Anemia   . Asthma 1975  . COPD (chronic obstructive pulmonary disease) ([place])   . Diabetes ([place])   . Diabetes mellitus without complication ([place]) 4627  . Diastolic CHF, acute on chronic ([facility])   . H/O aortic valve replacement   . Heart disease   . Hypothyroid   . Morbid obesity ([place])   . 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 OSA (obstructive sleep apnea)       Surgical History:  Past Surgical History:  Procedure Laterality Date  . aortic valve relaced   2008  . BACK SURGERY     AS CHILD  . BREAST SURGERY   September 25, 2011   intraductal papilloma of the left breast  . CHOLECYSTECTOMY  2007  . COLONOSCOPY WITH PROPOFOL N/A 08/04/2014   Procedure: COLONOSCOPY WITH PROPOFOL;  Surgeon: Manya Silvas, MD;  Location: [facility] ENDOSCOPY;  Service: Endoscopy;  Laterality: N/A;  . ESOPHAGOGASTRODUODENOSCOPY Left 03/03/2015   Procedure: ESOPHAGOGASTRODUODENOSCOPY (EGD);  Surgeon: Manya Silvas, MD;  Location: [facility] ENDOSCOPY;  Service: Endoscopy;  Laterality: Left;  . ESOPHAGOGASTRODUODENOSCOPY (EGD) WITH PROPOFOL  08/04/2014   Procedure: ESOPHAGOGASTRODUODENOSCOPY (EGD) WITH PROPOFOL;  Surgeon: Manya Silvas, MD;  Location: [facility] ENDOSCOPY;  Service: Endoscopy;;  . ESOPHAGOGASTRODUODENOSCOPY (EGD) WITH PROPOFOL N/A 04/09/2016   Procedure: ESOPHAGOGASTRODUODENOSCOPY (EGD) WITH PROPOFOL;  Surgeon: Lucilla Lame, MD;  Location: [facility] ENDOSCOPY;  Service: Endoscopy;  Laterality: N/A;  . PHRENIC NERVE PACEMAKER IMPLANTATION  2009  . TRACHEOSTOMY TUBE PLACEMENT N/A 02/04/2014   Procedure: TRACHEOSTOMY;  Surgeon: Rozetta Nunnery, MD;  Location: [place];  Service: ENT;  Laterality: N/A;     Home Meds: Prior to Admission medications   Medication Sig Start Date End Date Taking? Authorizing Provider  acetaminophen (TYLENOL) 500 MG tablet Take 1,000 mg by mouth every 6 (six) hours as needed for mild pain or headache.   Yes [provider]  albuterol (PROVENTIL HFA;VENTOLIN HFA) 108 (90 BASE) MCG/ACT inhaler Inhale 2 puffs into the lungs every 6 (six) hours as needed for wheezing or shortness of breath.  Yes [provider]  Calcium Carbonate-Vitamin D (CALCIUM 600+D) 600-400 MG-UNIT tablet Take 1 tablet by mouth at bedtime.   Yes [provider]  citalopram (CELEXA) 40 MG tablet Take 40 mg by mouth daily.   Yes [provider]  EPINEPHrine (EPIPEN 2-PAK) 0.3 mg/0.3 mL IJ SOAJ injection Inject 0.3 mg into the muscle once as needed (for severe allergic reaction).   Yes [provider]  gabapentin (NEURONTIN) 600 MG tablet Take 600 mg by mouth 4 (four) times daily.    Yes [provider]  levothyroxine (SYNTHROID, LEVOTHROID) 125 MCG tablet Take 100 mcg by mouth daily before breakfast.    Yes [provider]  loratadine (CLARITIN) 10 MG tablet Take 10 mg by mouth daily.   Yes [provider]  metFORMIN (GLUCOPHAGE-XR) 500 MG 24 hr tablet Take 500 mg by mouth 2 (two) times daily.    Yes [provider]  montelukast (SINGULAIR) 10 MG tablet Take 10 mg by mouth at bedtime.   Yes [provider]  Oxycodone HCl 10 MG TABS Take 10 mg by mouth 3 (three) times daily as needed for severe pain.    Yes [provider]  sucralfate (CARAFATE) 1 g tablet Take 1 tablet (1 g total) by mouth 4 (four) times daily -  with meals and at bedtime. 03/05/15  Yes Theodoro Grist, MD  traZODone (DESYREL) 50 MG tablet Take 50 mg by mouth at bedtime.    Yes [provider]  Vitamin D, Ergocalciferol, (DRISDOL) 50000 units CAPS capsule Take 50,000 Units by mouth every 7 (seven) days. Fridays   Yes [provider]  amoxicillin (AMOXIL) 500 MG capsule Take 1 capsule by mouth as needed for other. Takes prior to dental procedures 03/07/16   [provider]  cephALEXin (KEFLEX) 500 MG capsule Take 1 capsule (500 mg total) by mouth 3 (three) times daily. Patient not taking: Reported on 03/05/2017 01/14/17   Paulette Blanch, MD  cholecalciferol (VITAMIN D) 1000 units tablet Take 1,000 Units by mouth daily.    [provider]  fluticasone (FLONASE) 50 MCG/ACT nasal spray Place 2 sprays into both nostrils daily as needed for rhinitis.     [provider]    Inpatient Medications:  . atorvastatin  10 mg Oral q1800  . citalopram  40 mg Oral Daily  . enoxaparin (LOVENOX) injection  40 mg Subcutaneous BID  . gabapentin  600 mg Oral QID  . insulin aspart  0-9 Units Subcutaneous Q6H  . levothyroxine  100 mcg Oral  QAC breakfast  . montelukast  10 mg Oral QHS  . sucralfate  1 g Oral TID WC & HS  . traZODone  50 mg Oral QHS     Allergies:  Allergies  Allergen Reactions  . Furosemide Other (See Comments)    Pt states that this medication caused renal failure.    . Mold Extract [Trichophyton] Anaphylaxis  . Tetanus Toxoids Other (See Comments)    Pt states that her arm turns black.    Sheaffer Crigler [Beclomethasone] Other (See Comments)    Reaction:  Thrush   . Toprol Xl [Metoprolol Tartrate] Hives  . Hctz [Hydrochlorothiazide] Other (See Comments)    Reports renal failure   . Nickel   . Nitroglycerin Other (See Comments)    Pt states that this medication makes her BP bottom out.    . Other Hives, Swelling and Other (See Comments)    Pt states that she is allergic  to steroids.      Social History   Socioeconomic History  . Marital status: Widowed    Spouse name: Not on file  . Number of children: Not on file  . Years of education: Not on file  . Highest education level: Not on file  Social Needs  . Financial resource strain: Not on file  . Food insecurity - worry: Not on file  . Food insecurity - inability: Not on file  . Transportation needs - medical: Not on file  . Transportation needs - non-medical: Not on file  Occupational History  . Not on file  Tobacco Use  . Smoking status: Former Research scientist (life sciences)  . Smokeless tobacco: Never Used  Substance and Sexual Activity  . Alcohol use: No  . Drug use: No  . Sexual activity: Not on file  Other Topics Concern  . Not on file  Social History Narrative  . Not on file     Family History  Problem Relation Age of Onset  . Skin telangiectasia Mother   . Lung cancer Maternal Grandmother   . Breast cancer Maternal Grandmother   . Lung cancer Paternal Grandmother      Review of Systems: A 12-system review of systems was performed and is negative except as noted in the HPI.  Labs: Recent Labs    03/05/17 1549 03/05/17 2013 03/06/17 0212   TROPONINI <0.03 <0.03 <0.03   Lab Results  Component Value Date   WBC 9.6 03/06/2017   HGB 14.2 03/06/2017   HCT 42.4 03/06/2017   MCV 90.4 03/06/2017   PLT 103 (L) 03/06/2017    Recent Labs  Lab 03/06/17 0647  NA 141  K 4.6  CL 103  CO2 29  BUN 24*  CREATININE 0.71  CALCIUM 9.3  GLUCOSE 179*   Lab Results  Component Value Date   TRIG 200 (H) 01/31/2014   No results found for: DDIMER  Radiology/Studies:  Dg Chest 2 View  Result Date: 03/05/2017 CLINICAL DATA:  Shortness of breath and diaphoresis with chest pain. EXAM: CHEST  2 VIEW COMPARISON:  01/13/2017 FINDINGS: The cardiac silhouette, mediastinal and hilar contours are within normal limits and stable. There is tortuosity of the thoracic aorta. Stable surgical changes from valve replacement surgery. The pacer wires are stable. Chronic bronchitic type lung changes but no infiltrates, edema or effusions. The bony thorax is intact. IMPRESSION: No acute cardiopulmonary findings. Electronically Signed   By: Marijo Sanes M.D.   On: 03/05/2017 16:59   US Carotid Bilateral  Result Date: 03/06/2017 CLINICAL DATA:  62 year old female with near syncope EXAM: BILATERAL CAROTID DUPLEX ULTRASOUND TECHNIQUE: Pearline Cables scale imaging, color Doppler and duplex ultrasound were performed of bilateral carotid and vertebral arteries in the neck. COMPARISON:  Prior duplex carotid ultrasound 07/14/2013 FINDINGS: Criteria: Quantification of carotid stenosis is based on velocity parameters that correlate the residual internal carotid diameter with NASCET-based stenosis levels, using the diameter of the distal internal carotid lumen as the denominator for stenosis measurement. The following velocity measurements were obtained: RIGHT ICA:  81/20 cm/sec CCA:  35/32 cm/sec SYSTOLIC ICA/CCA RATIO:  0.9 DIASTOLIC ICA/CCA RATIO:  0.9 ECA:  153 cm/sec LEFT ICA:  108/27 cm/sec CCA:  992/42 cm/sec SYSTOLIC ICA/CCA RATIO:  1.0 DIASTOLIC ICA/CCA RATIO:  0.9 ECA:  142  cm/sec RIGHT CAROTID ARTERY: Mild focal heterogeneous atherosclerotic plaque in the proximal internal carotid artery. By peak systolic velocity criteria, the estimated stenosis remains less than 50%. This is insignificantly changed compared to prior. RIGHT  VERTEBRAL ARTERY:  Hypoplastic but patent with antegrade flow. LEFT CAROTID ARTERY: Mild focal heterogeneous atherosclerotic plaque in the proximal internal carotid artery. By peak systolic velocity criteria, the estimated stenosis remains less than 50%. LEFT VERTEBRAL ARTERY:  Patent with antegrade flow. IMPRESSION: 1. Mild (1-49%) stenosis proximal right internal carotid artery secondary to mild focal heterogeneous atherosclerotic plaque. 2. Mild (1-49%) stenosis proximal left internal carotid artery secondary to mild focal heterogeneous atherosclerotic plaque. 3. Patent vertebral arteries with normal directional flow. Signed, Criselda Peaches, MD [facility] Radiology Electronically Signed   By: Jacqulynn Cadet M.D.   On: 03/06/2017 11:38    Wt Readings from Last 3 Encounters:  03/06/17 (!) 148.6 kg (327 lb 8 oz)  01/13/17 (!) 145.2 kg (320 lb)  09/24/16 (!) 153.3 kg (338 lb)    EKG: Normal sinus rhythm with nonspecific ST-T wave changes, no ischemia.  Physical Exam: Blood pressure (!) 119/58, pulse (!) 55, temperature 98.4 F (36.9 C), temperature source Oral, resp. rate 18, height 5' 7"  (1.702 m), weight (!) 148.6 kg (327 lb 8 oz), SpO2 92 %. Body mass index is 51.29 kg/m. General: Well developed, well nourished, in no acute distress. Head: Normocephalic, atraumatic, sclera non-icteric, no xanthomas, nares are without discharge.  Neck: Negative for carotid bruits. JVD not elevated. Lungs: Clear bilaterally to auscultation without wheezes, rales, or rhonchi. Breathing is unlabored. Heart: RRR with S1 S2.  1/6 to 2/6 systolic murmur radiating to right and left upper sternal borders.  No  diastolic murmur audible today. Abdomen: Obese.  Soft, non-tender, non-distended with normoactive bowel sounds. No hepatomegaly palpated. No rebound/guarding. No obvious abdominal masses. Msk:  Strength and tone appear normal for age. Extremities: No clubbing or cyanosis. No edema.  Distal pedal pulses are 2+ and equal bilaterally. Neuro: Alert and oriented X 3. No facial asymmetry. No focal deficit. Moves all extremities spontaneously. Psych:  Responds to questions appropriately with a normal affect.     Assessment and Plan  62 year old female with history of bicuspid aortic valve status post aortic valve replacement with a prosthetic valve.  Was admitted with chest pain is ruled out for myocardial infarction.  Symptoms were atypical for angina.  EKG unremarkable.  Will evaluate echo when available to determine if there is significant interval change in her mitral valve prosthesis.  If this is unremarkable, will consider discharge home on current regimen with outpatient follow-up.  Would continue with atorvastatin  daily with an LDL goal of 70 based on current guidelines for diabetic patient.  Would suggest switching to a more high intensity dose of the atorvastatin at 80 mg daily.  Weight loss is recommended.  Signed, Teodoro Spray MD 03/06/2017, 1:46 PM Pager: [phone]

## 2017-03-06 NOTE — H&P (Signed)
[facility] [place] NAME: Theresa Leon    MR#:  [id number]  DATE OF BIRTH:  May 17, 1955  DATE OF ADMISSION:  03/05/2017  PRIMARY CARE PHYSICIAN: Perrin Maltese, MD   REQUESTING/REFERRING PHYSICIAN: Cinda Quest, MD  CHIEF COMPLAINT:   Chief Complaint  Patient presents with  . Hyperglycemia  . Chest Pain  . Shortness of Breath    HISTORY OF PRESENT ILLNESS:  Theresa Leon  is a 62 y.o. female who presents with chest discomfort.  Patient states that this started last night was accompanied by diaphoresis, was central in location and pressure-like in nature.  Today she was going to the doctor's office to be evaluated and had several near syncopal episodes.  Here in the ED she initially has 2- troponins, hospitalist were called for admission and further evaluation.  PAST MEDICAL HISTORY:   Past Medical History:  Diagnosis Date  . Acute on chronic renal failure ([place])   . Anemia   . Asthma 1975  . COPD (chronic obstructive pulmonary disease) ([place])   . Diabetes ([place])   . Diabetes mellitus without complication ([place]) 7939  . Diastolic CHF, acute on chronic (HCC)   . H/O aortic valve replacement   . Heart disease   . Hypothyroid   . Morbid obesity ([place])   . 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 OSA (obstructive sleep apnea)     PAST SURGICAL HISTORY:   Past Surgical History:  Procedure Laterality Date  . aortic valve relaced   2008  . BACK SURGERY     AS CHILD  . BREAST SURGERY  September 25, 2011   intraductal papilloma of the left breast  . CHOLECYSTECTOMY  2007  . COLONOSCOPY WITH PROPOFOL N/A 08/04/2014   Procedure: COLONOSCOPY WITH PROPOFOL;  Surgeon: Manya Silvas, MD;  Location: [facility] ENDOSCOPY;  Service: Endoscopy;  Laterality: N/A;  . ESOPHAGOGASTRODUODENOSCOPY Left 03/03/2015   Procedure: ESOPHAGOGASTRODUODENOSCOPY (EGD);  Surgeon: Manya Silvas, MD;  Location: [facility] ENDOSCOPY;   Service: Endoscopy;  Laterality: Left;  . ESOPHAGOGASTRODUODENOSCOPY (EGD) WITH PROPOFOL  08/04/2014   Procedure: ESOPHAGOGASTRODUODENOSCOPY (EGD) WITH PROPOFOL;  Surgeon: Manya Silvas, MD;  Location[facility] ENDOSCOPY;  Service: Endoscopy;;  . ESOPHAGOGASTRODUODENOSCOPY (EGD) WITH PROPOFOL N/A 04/09/2016   Procedure: ESOPHAGOGASTRODUODENOSCOPY (EGD) WITH PROPOFOL;  Surgeon: Lucilla Lame, MD;  Location: [facility] ENDOSCOPY;  Service: Endoscopy;  Laterality: N/A;  . PHRENIC NERVE PACEMAKER IMPLANTATION  2009  . TRACHEOSTOMY TUBE PLACEMENT N/A 02/04/2014   Procedure: TRACHEOSTOMY;  Surgeon: Rozetta Nunnery, MD;  Location: [facility];  Service: ENT;  Laterality: N/A;    SOCIAL HISTORY:   Social History   Tobacco Use  . Smoking status: Former Research scientist (life sciences)  . Smokeless tobacco: Never Used  Substance Use Topics  . Alcohol use: No    FAMILY HISTORY:   Family History  Problem Relation Age of Onset  . Skin telangiectasia Mother   . Lung cancer Maternal Grandmother   . Breast cancer Maternal Grandmother   . Lung cancer Paternal Grandmother     DRUG ALLERGIES:   Allergies  Allergen Reactions  . Furosemide Other (See Comments)    Pt states that this medication caused renal failure.    . Mold Extract [Trichophyton] Anaphylaxis  . Tetanus Toxoids Other (See Comments)    Pt states that her arm turns black.    Velarde Crigler [Beclomethasone] Other (See Comments)    Reaction:  Thrush   .  Toprol Xl [Metoprolol Tartrate] Hives  . Hctz [Hydrochlorothiazide] Other (See Comments)    Reports renal failure   . Nickel   . Nitroglycerin Other (See Comments)    Pt states that this medication makes her BP bottom out.    . Other Hives, Swelling and Other (See Comments)    Pt states that she is allergic to steroids.      MEDICATIONS AT HOME:   Prior to Admission medications   Medication Sig Start Date End Date Taking? Authorizing Provider  acetaminophen (TYLENOL) 500 MG tablet Take 1,000 mg by mouth every 6 (six)  hours as needed for mild pain or headache.   Yes [provider]  albuterol (PROVENTIL HFA;VENTOLIN HFA) 108 (90 BASE) MCG/ACT inhaler Inhale 2 puffs into the lungs every 6 (six) hours as needed for wheezing or shortness of breath.    Yes [provider]  Calcium Carbonate-Vitamin D (CALCIUM 600+D) 600-400 MG-UNIT tablet Take 1 tablet by mouth at bedtime.   Yes [provider]  citalopram (CELEXA) 40 MG tablet Take 40 mg by mouth daily.   Yes [provider]  EPINEPHrine (EPIPEN 2-PAK) 0.3 mg/0.3 mL IJ SOAJ injection Inject 0.3 mg into the muscle once as needed (for severe allergic reaction).   Yes [provider]  gabapentin (NEURONTIN) 600 MG tablet Take 600 mg by mouth 4 (four) times daily.    Yes [provider]  levothyroxine (SYNTHROID, LEVOTHROID) 125 MCG tablet Take 100 mcg by mouth daily before breakfast.    Yes [provider]  loratadine (CLARITIN) 10 MG tablet Take 10 mg by mouth daily.   Yes [provider]  metFORMIN (GLUCOPHAGE-XR) 500 MG 24 hr tablet Take 500 mg by mouth 2 (two) times daily.    Yes [provider]  montelukast (SINGULAIR) 10 MG tablet Take 10 mg by mouth at bedtime.   Yes [provider]  Oxycodone HCl 10 MG TABS Take 10 mg by mouth 3 (three) times daily as needed for severe pain.    Yes [provider]  sucralfate (CARAFATE) 1 g tablet Take 1 tablet (1 g total) by mouth 4 (four) times daily -  with meals and at bedtime. 03/05/15  Yes Theodoro Grist, MD  traZODone (DESYREL) 50 MG tablet Take 50 mg by mouth at bedtime.    Yes [provider]  Vitamin D, Ergocalciferol, (DRISDOL) 50000 units CAPS capsule Take 50,000 Units by mouth every 7 (seven) days. Fridays   Yes [provider]  amoxicillin (AMOXIL) 500 MG capsule Take 1 capsule by mouth as needed for other. Takes prior to dental procedures 03/07/16   [provider]  cephALEXin (KEFLEX) 500 MG  capsule Take 1 capsule (500 mg total) by mouth 3 (three) times daily. Patient not taking: Reported on 03/05/2017 01/14/17   Paulette Blanch, MD  cholecalciferol (VITAMIN D) 1000 units tablet Take 1,000 Units by mouth daily.    [provider]  fluticasone (FLONASE) 50 MCG/ACT nasal spray Place 2 sprays into both nostrils daily as needed for rhinitis.     [provider]    REVIEW OF SYSTEMS:  Review of Systems  Constitutional: Positive for diaphoresis. Negative for chills, fever, malaise/fatigue and weight loss.  HENT: Negative for ear pain, hearing loss and tinnitus.   Eyes: Negative for blurred vision, double vision, pain and redness.  Respiratory: Negative for cough, hemoptysis and shortness of breath.   Cardiovascular: Positive for chest pain. Negative for palpitations, orthopnea and leg  swelling.  Gastrointestinal: Negative for abdominal pain, constipation, diarrhea, nausea and vomiting.  Genitourinary: Negative for dysuria, frequency and hematuria.  Musculoskeletal: Negative for back pain, joint pain and neck pain.  Skin:       No acne, rash, or lesions  Neurological: Negative for dizziness, tremors, focal weakness and weakness.       Near syncope  Endo/Heme/Allergies: Negative for polydipsia. Does not bruise/bleed easily.  Psychiatric/Behavioral: Negative for depression. The patient is not nervous/anxious and does not have insomnia.      VITAL SIGNS:   Vitals:   03/05/17 1541 03/05/17 2202 03/05/17 2245  BP: 123/63 119/71 103/85  Pulse: 61 71 67  Resp: 20 16   Temp: 98.3 F (36.8 C)  97.7 F (36.5 C)  TempSrc: Oral  Oral  SpO2: 95% 96% 96%  Weight: (!) 147.4 kg (325 lb)  (!) 148.7 kg (327 lb 14.4 oz)  Height: 5' 7"  (1.702 m)     Wt Readings from Last 3 Encounters:  03/05/17 (!) 148.7 kg (327 lb 14.4 oz)  01/13/17 (!) 145.2 kg (320 lb)  09/24/16 (!) 153.3 kg (338 lb)    PHYSICAL EXAMINATION:  Physical Exam  Vitals reviewed. Constitutional: She is  oriented to person, place, and time. She appears well-developed and well-nourished. No distress.  HENT:  Head: Normocephalic and atraumatic.  Mouth/Throat: Oropharynx is clear and moist.  Eyes: Conjunctivae and EOM are normal. Pupils are equal, round, and reactive to light. No scleral icterus.  Neck: Normal range of motion. Neck supple. No JVD present. No thyromegaly present.  Cardiovascular: Normal rate, regular rhythm and intact distal pulses. Exam reveals no gallop and no friction rub.  No murmur heard. Respiratory: Effort normal and breath sounds normal. No respiratory distress. She has no wheezes. She has no rales.  GI: Soft. Bowel sounds are normal. She exhibits no distension. There is no tenderness.  Musculoskeletal: Normal range of motion. She exhibits no edema.  No arthritis, no gout  Lymphadenopathy:    She has no cervical adenopathy.  Neurological: She is alert and oriented to person, place, and time. No cranial nerve deficit.  No dysarthria, no aphasia  Skin: Skin is warm and dry. No rash noted. No erythema.  Psychiatric: She has a normal mood and affect. Her behavior is normal. Judgment and thought content normal.    LABORATORY PANEL:   CBC Recent Labs  Lab 03/05/17 1549  WBC 11.1*  HGB 14.5  HCT 43.5  PLT 112*   ------------------------------------------------------------------------------------------------------------------  Chemistries  Recent Labs  Lab 03/05/17 1549  NA 138  K 4.6  CL 100*  CO2 28  GLUCOSE 224*  BUN 23*  CREATININE 0.87  CALCIUM 9.5   ------------------------------------------------------------------------------------------------------------------  Cardiac Enzymes Recent Labs  Lab 03/05/17 2013  TROPONINI <0.03   ------------------------------------------------------------------------------------------------------------------  RADIOLOGY:  Dg Chest 2 View  Result Date: 03/05/2017 CLINICAL DATA:  Shortness of breath and  diaphoresis with chest pain. EXAM: CHEST  2 VIEW COMPARISON:  01/13/2017 FINDINGS: The cardiac silhouette, mediastinal and hilar contours are within normal limits and stable. There is tortuosity of the thoracic aorta. Stable surgical changes from valve replacement surgery. The pacer wires are stable. Chronic bronchitic type lung changes but no infiltrates, edema or effusions. The bony thorax is intact. IMPRESSION: No acute cardiopulmonary findings. Electronically Signed   By: Marijo Sanes M.D.   On: 03/05/2017 16:59    EKG:   Orders placed or performed during the hospital encounter of 03/05/17  . ED EKG within 10  minutes  . ED EKG within 10 minutes    IMPRESSION AND PLAN:  Principal Problem:   Chest pain -with some near syncopal episodes.  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  Unclear etiology for her chest pain and near syncope, though arrhythmias in the differential as well as ACS.  We will trend her troponins through a third set tonight, get an echocardiogram and a cardiology consult in the morning Active Problems:   Type 2 diabetes mellitus (HCC) -sliding scale insulin with corresponding glucose checks   COPD (chronic obstructive pulmonary disease) (HCC) -home dose inhalers   Chronic diastolic CHF (congestive heart failure) ([place]) -continue home meds   OSA on CPAP -CPAP nightly   Hypothyroidism -home dose thyroid replacement  All the records are reviewed and case discussed with ED provider. Management plans discussed with the patient and/or family.  DVT PROPHYLAXIS: SubQ lovenox  GI PROPHYLAXIS: None  ADMISSION STATUS: Observation  CODE STATUS: Full    Code Status Orders  (From admission, onward)        Start     Ordered   03/05/17 2241  Full code  Continuous     03/05/17 2240    Code Status History    Date Active Date Inactive Code Status Order ID Comments User  Context   09/18/2015 19:11 09/19/2015 09:29 Full Code [id number]  Lytle Butte, MD ED   03/01/2015 22:05 03/05/2015 17:21 Full Code [id number]  Lance Coon, MD Inpatient   01/25/2014 19:09 03/15/2014 16:32 Full Code [id number]  Carney Harder Inpatient      TOTAL TIME TAKING CARE OF THIS PATIENT: 40 minutes.   Jannifer Franklin, Alistair Senft Holley 03/06/2017, 1:28 AM  [facility]  Office  [phone]  CC: Primary care physician; Perrin Maltese, MD  Note:  This document was prepared using Dragon voice recognition software and may include unintentional dictation errors.

## 2017-03-06 NOTE — Plan of Care (Signed)
  Progressing Education: Knowledge of General Education information will improve 03/06/2017 0039 - Progressing by Jeri Cos, RN Clinical Measurements: Diagnostic test results will improve 03/06/2017 0039 - Progressing by Jeri Cos, RN Cardiovascular complication will be avoided 03/06/2017 0039 - Progressing by Jeri Cos, RN Activity: Risk for activity intolerance will decrease 03/06/2017 0039 - Progressing by Jeri Cos, RN Pain Managment: General experience of comfort will improve 03/06/2017 0039 - Progressing by Jeri Cos, RN Activity: Ability to tolerate increased activity will improve 03/06/2017 0039 - Progressing by Jeri Cos, RN Cardiac: Ability to achieve and maintain adequate cardiovascular perfusion will improve 03/06/2017 0039 - Progressing by Jeri Cos, RN

## 2017-03-06 NOTE — Care Management (Signed)
Placed in observation for chest pain and near syncopal episode.  Troponins negative.  Cardiology consult pending

## 2017-03-07 LAB — HIV ANTIBODY (ROUTINE TESTING W REFLEX): HIV Screen 4th Generation wRfx: NONREACTIVE

## 2017-03-09 NOTE — Discharge Summary (Signed)
Theresa Leon, is a 62 y.o. female  DOB 1955-07-04  MRN [id number].  Admission date:  03/05/2017  Admitting Physician  Lance Coon, MD  Discharge Date:  03/06/2017   Primary MD  Perrin Maltese, MD  Recommendations for primary care physician for things to follow:   Follow-up with PCP in 1 week  Admission Diagnosis  Chest pain, unspecified type [R07.9]   Discharge Diagnosis  Chest pain, unspecified type [R07.9]    Principal Problem:   Chest pain Active Problems:   Type 2 diabetes mellitus (HCC)   GERD (gastroesophageal reflux disease)   HLD (hyperlipidemia)   COPD (chronic obstructive pulmonary disease) (HCC)   Chronic diastolic CHF (congestive heart failure) (HCC)   Hypothyroidism   OSA on CPAP      Past Medical History:  Diagnosis Date  . Acute on chronic renal failure ([place])   . Anemia   . Asthma 1975  . COPD (chronic obstructive pulmonary disease) ([place])   . Diabetes ([place])   . Diabetes mellitus without complication ([place]) 4888  . Diastolic CHF, acute on chronic (HCC)   . H/O aortic valve replacement   . Heart disease   . Hypothyroid   . Morbid obesity ([place])   . 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 OSA (obstructive sleep apnea)     Past Surgical History:  Procedure Laterality Date  . aortic valve relaced   2008  . BACK SURGERY     AS CHILD  . BREAST SURGERY  September 25, 2011   intraductal papilloma of the left breast  . CHOLECYSTECTOMY  2007  . COLONOSCOPY WITH PROPOFOL N/A 08/04/2014   Procedure: COLONOSCOPY WITH PROPOFOL;  Surgeon: Manya Silvas, MD;  Location: [facility] ENDOSCOPY;  Service: Endoscopy;  Laterality: N/A;  . ESOPHAGOGASTRODUODENOSCOPY Left 03/03/2015   Procedure: ESOPHAGOGASTRODUODENOSCOPY (EGD);  Surgeon: Manya Silvas, MD;  Location: [facility]  ENDOSCOPY;  Service: Endoscopy;  Laterality: Left;  . ESOPHAGOGASTRODUODENOSCOPY (EGD) WITH PROPOFOL  08/04/2014   Procedure: ESOPHAGOGASTRODUODENOSCOPY (EGD) WITH PROPOFOL;  Surgeon: Manya Silvas, MD;  Location: [facility] ENDOSCOPY;  Service: Endoscopy;;  . ESOPHAGOGASTRODUODENOSCOPY (EGD) WITH PROPOFOL N/A 04/09/2016   Procedure: ESOPHAGOGASTRODUODENOSCOPY (EGD) WITH PROPOFOL;  Surgeon: Lucilla Lame, MD;  Location: [facility] ENDOSCOPY;  Service: Endoscopy;  Laterality: N/A;  . PHRENIC NERVE PACEMAKER IMPLANTATION  2009  . TRACHEOSTOMY TUBE PLACEMENT N/A 02/04/2014   Procedure: TRACHEOSTOMY;  Surgeon: Rozetta Nunnery, MD;  Location: [place];  Service: ENT;  Laterality: N/A;       History of present illness and  Hospital Course:     Kindly see H&P for history of present illness and admission details, please review complete Labs, Consult reports and Test reports for all details in brief  HPI  from the history and physical done on the day of admission 62 year old female patient with history of COPD, diastolic heart failure, bicuspid aortic valve stenosis status post replacement with bovine valve, admitted bilateral chest pain and she is admitted to telemetry for chest pain evaluation.   Hospital Course  Musculoskeletal chest pain: Patient troponins have been negative, EKG showed normal sinus rhythm with nonspecific ST T changes.  No ischemia, chest x-ray showed no acute cardiopulmonary disease.  Patient seen by cardiology Dr. [name], he suggested no further vomiting, echocardiogram showed EF 55-60% with no wall motion abnormality. #2 near syncope: Patient ultrasound did not show any hemodynamically significant stenosis. Morbid obesity, obesity hypoventilation with sleep apnea, uses CPAP at  night. 3.  Hypothyroidism constant continues in therapy 4.  Diabetes mellitus type 2: Continue metformin, patient has diabetic neuropathy: Continue Neurontin. 5.  History of COPD: Continue home dose  inhalers.   Discharge Condition: Stable   Follow UP  Follow-up Information    Perrin Maltese, MD. Schedule an appointment as soon as possible for a visit.   Specialty:  Internal Medicine Contact information: [place] [address] [phone]             Discharge Instructions  and  Discharge Medications      Allergies as of 03/06/2017      Reactions   Furosemide Other (See Comments)   Pt states that this medication caused renal failure.     Mold Extract [trichophyton] Anaphylaxis   Tetanus Toxoids Other (See Comments)   Pt states that her arm turns black.     Qvar [beclomethasone] Other (See Comments)   Reaction:  Thrush    Toprol Xl [metoprolol Tartrate] Hives   Hctz [hydrochlorothiazide] Other (See Comments)   Reports renal failure   Nickel    Nitroglycerin Other (See Comments)   Pt states that this medication makes her BP bottom out.     Other Hives, Swelling, Other (See Comments)   Pt states that she is allergic to steroids.        Medication List    STOP taking these medications   cephALEXin 500 MG capsule Commonly known as:  KEFLEX     TAKE these medications   acetaminophen 500 MG tablet Commonly known as:  TYLENOL Take 1,000 mg by mouth every 6 (six) hours as needed for mild pain or headache.   albuterol 108 (90 Base) MCG/ACT inhaler Commonly known as:  PROVENTIL HFA;VENTOLIN HFA Inhale 2 puffs into the lungs every 6 (six) hours as needed for wheezing or shortness of breath.   amoxicillin 500 MG capsule Commonly known as:  AMOXIL Take 1 capsule by mouth as needed for other. Takes prior to dental procedures   atorvastatin 80 MG tablet Commonly known as:  LIPITOR Take 1 tablet (80 mg total) by mouth daily at 6 PM. What changed:    medication strength  how much to take   CALCIUM 600+D 600-400 MG-UNIT tablet Generic drug:  Calcium Carbonate-Vitamin D Take 1 tablet by mouth at bedtime.   cholecalciferol 1000 units  tablet Commonly known as:  VITAMIN D Take 1,000 Units by mouth daily.   citalopram 40 MG tablet Commonly known as:  CELEXA Take 40 mg by mouth daily.   EPIPEN 2-PAK 0.3 mg/0.3 mL Soaj injection Generic drug:  EPINEPHrine Inject 0.3 mg into the muscle once as needed (for severe allergic reaction).   fluticasone 50 MCG/ACT nasal spray Commonly known as:  FLONASE Place 2 sprays into both nostrils daily as needed for rhinitis.   gabapentin 600 MG tablet Commonly known as:  NEURONTIN Take 600 mg by mouth 4 (four) times daily.   levothyroxine 125 MCG tablet Commonly known as:  SYNTHROID, LEVOTHROID Take 100 mcg by mouth daily before breakfast.   loratadine 10 MG tablet Commonly known as:  CLARITIN Take 10 mg by mouth daily.   metFORMIN 500 MG 24 hr tablet Commonly known as:  GLUCOPHAGE-XR Take 500 mg by mouth 2 (two) times daily.   montelukast 10 MG tablet Commonly known as:  SINGULAIR Take 10 mg by mouth at bedtime.   Oxycodone HCl 10 MG Tabs Take 10 mg by mouth 3 (three) times daily as needed  for severe pain.   sucralfate 1 g tablet Commonly known as:  CARAFATE Take 1 tablet (1 g total) by mouth 4 (four) times daily -  with meals and at bedtime.   traZODone 50 MG tablet Commonly known as:  DESYREL Take 50 mg by mouth at bedtime.   Vitamin D (Ergocalciferol) 50000 units Caps capsule Commonly known as:  DRISDOL Take 50,000 Units by mouth every 7 (seven) days. Fridays         Diet and Activity recommendation: See Discharge Instructions above   Consults obtained -cardiology   Major procedures and Radiology Reports - PLEASE review detailed and final reports for all details, in brief -    Dg Chest 2 View  Result Date: 03/05/2017 CLINICAL DATA:  Shortness of breath and diaphoresis with chest pain. EXAM: CHEST  2 VIEW COMPARISON:  01/13/2017 FINDINGS: The cardiac silhouette, mediastinal and hilar contours are within normal limits and stable. There is tortuosity  of the thoracic aorta. Stable surgical changes from valve replacement surgery. The pacer wires are stable. Chronic bronchitic type lung changes but no infiltrates, edema or effusions. The bony thorax is intact. IMPRESSION: No acute cardiopulmonary findings. Electronically Signed   By: Marijo Sanes M.D.   On: 03/05/2017 16:59   US Carotid Bilateral  Result Date: 03/06/2017 CLINICAL DATA:  62 year old female with near syncope EXAM: BILATERAL CAROTID DUPLEX ULTRASOUND TECHNIQUE: Pearline Cables scale imaging, color Doppler and duplex ultrasound were performed of bilateral carotid and vertebral arteries in the neck. COMPARISON:  Prior duplex carotid ultrasound 07/14/2013 FINDINGS: Criteria: Quantification of carotid stenosis is based on velocity parameters that correlate the residual internal carotid diameter with NASCET-based stenosis levels, using the diameter of the distal internal carotid lumen as the denominator for stenosis measurement. The following velocity measurements were obtained: RIGHT ICA:  81/20 cm/sec CCA:  45/62 cm/sec SYSTOLIC ICA/CCA RATIO:  0.9 DIASTOLIC ICA/CCA RATIO:  0.9 ECA:  153 cm/sec LEFT ICA:  108/27 cm/sec CCA:  563/89 cm/sec SYSTOLIC ICA/CCA RATIO:  1.0 DIASTOLIC ICA/CCA RATIO:  0.9 ECA:  142 cm/sec RIGHT CAROTID ARTERY: Mild focal heterogeneous atherosclerotic plaque in the proximal internal carotid artery. By peak systolic velocity criteria, the estimated stenosis remains less than 50%. This is insignificantly changed compared to prior. RIGHT VERTEBRAL ARTERY:  Hypoplastic but patent with antegrade flow. LEFT CAROTID ARTERY: Mild focal heterogeneous atherosclerotic plaque in the proximal internal carotid artery. By peak systolic velocity criteria, the estimated stenosis remains less than 50%. LEFT VERTEBRAL ARTERY:  Patent with antegrade flow. IMPRESSION: 1. Mild (1-49%) stenosis proximal right internal carotid artery secondary to mild focal heterogeneous atherosclerotic plaque. 2. Mild (1-49%)  stenosis proximal left internal carotid artery secondary to mild focal heterogeneous atherosclerotic plaque. 3. Patent vertebral arteries with normal directional flow. Signed, Criselda Peaches, MD [facility] Radiology Electronically Signed   By: Jacqulynn Cadet M.D.   On: 03/06/2017 11:38    Micro Results     No results found for this or any previous visit (from the past 240 hour(s)).     Today   Subjective:   Lovene Maret today has no headache,no chest abdominal pain,no new weakness tingling or numbness, feels much better wants to go home today.   Objective:   Blood pressure (!) 119/58, pulse (!) 55, temperature 98.4 F (36.9 C), temperature source Oral, resp. rate 18, height 5' 7"  (1.702 m), weight (!) 148.6 kg (327 lb 8 oz), SpO2 92 %.  No intake or output data in the  24 hours ending 03/09/17 1454  Exam Awake Alert, Oriented x 3, No new F.N deficits, Normal affect [place].AT,PERRAL Supple Neck,No JVD, No cervical lymphadenopathy appriciated.  Symmetrical Chest wall movement, Good air movement bilaterally, CTAB RRR,No Gallops,Rubs or new Murmurs, No Parasternal Heave +ve B.Sounds, Abd Soft, Non tender, No organomegaly appriciated, No rebound -guarding or rigidity. No Cyanosis, Clubbing or edema, No new Rash or bruise  Data Review   CBC w Diff:  Lab Results  Component Value Date   WBC 9.6 03/06/2017   HGB 14.2 03/06/2017   HGB 7.1 (L) 01/25/2014   HCT 42.4 03/06/2017   HCT 22.5 (L) 01/25/2014   PLT 103 (L) 03/06/2017   PLT 118 (L) 01/25/2014   LYMPHOPCT 48 09/24/2016   LYMPHOPCT 17.9 01/25/2014   MONOPCT 7 09/24/2016   MONOPCT 9.8 01/25/2014   EOSPCT 3 09/24/2016   EOSPCT 4.2 01/25/2014   BASOPCT 1 09/24/2016   BASOPCT 0.6 01/25/2014    CMP:  Lab Results  Component Value Date   NA 141 03/06/2017   NA 142 01/25/2014   K 4.6 03/06/2017   K 4.3 01/25/2014   CL 103 03/06/2017   CL 106 01/25/2014   CO2 29  03/06/2017   CO2 30 01/25/2014   BUN 24 (H) 03/06/2017   BUN 24 (H) 01/25/2014   CREATININE 0.71 03/06/2017   CREATININE 1.05 01/25/2014   PROT 7.0 09/24/2016   PROT 7.5 01/17/2014   ALBUMIN 3.6 09/24/2016   ALBUMIN 3.2 (L) 01/17/2014   BILITOT 2.0 (H) 09/24/2016   BILITOT 1.1 (H) 01/17/2014   ALKPHOS 64 09/24/2016   ALKPHOS 76 01/17/2014   AST 32 09/24/2016   AST 20 01/17/2014   ALT 30 09/24/2016   ALT 16 01/17/2014  .   Total Time in preparing paper work, data evaluation and todays exam - 35 minutes  Epifanio Lesches M.D on 03/06/2017 at 2:54 PM    Note: This dictation was prepared with Dragon dictation along with smaller phrase technology. Any transcriptional errors that result from this process are unintentional.

## 2017-03-25 ENCOUNTER — Other Ambulatory Visit: Payer: Self-pay | Admitting: Nurse Practitioner

## 2017-03-25 DIAGNOSIS — K7460 Unspecified cirrhosis of liver: Secondary | ICD-10-CM

## 2017-04-04 ENCOUNTER — Other Ambulatory Visit: Payer: Self-pay | Admitting: Internal Medicine

## 2017-04-04 DIAGNOSIS — Z1231 Encounter for screening mammogram for malignant neoplasm of breast: Secondary | ICD-10-CM

## 2017-04-08 ENCOUNTER — Ambulatory Visit
Admission: RE | Admit: 2017-04-08 | Discharge: 2017-04-08 | Disposition: A | Discharge status: Home / Self Care | Payer: Medicare HMO | Source: Ambulatory Visit | Attending: Nurse Practitioner | Admitting: Nurse Practitioner

## 2017-04-08 DIAGNOSIS — Z9049 Acquired absence of other specified parts of digestive tract: Secondary | ICD-10-CM | POA: Diagnosis not present

## 2017-04-08 DIAGNOSIS — K7460 Unspecified cirrhosis of liver: Secondary | ICD-10-CM

## 2017-04-23 ENCOUNTER — Ambulatory Visit
Admission: RE | Admit: 2017-04-23 | Discharge: 2017-04-23 | Disposition: A | Discharge status: Home / Self Care | Payer: Medicare HMO | Source: Ambulatory Visit | Attending: Internal Medicine | Admitting: Internal Medicine

## 2017-04-23 DIAGNOSIS — Z1231 Encounter for screening mammogram for malignant neoplasm of breast: Secondary | ICD-10-CM | POA: Insufficient documentation

## 2017-05-04 ENCOUNTER — Encounter: Payer: Self-pay | Admitting: Emergency Medicine

## 2017-05-04 ENCOUNTER — Emergency Department: Payer: Medicare HMO

## 2017-05-04 ENCOUNTER — Other Ambulatory Visit: Payer: Self-pay

## 2017-05-04 ENCOUNTER — Emergency Department
Admission: EM | Admit: 2017-05-04 | Discharge: 2017-05-04 | Disposition: A | Discharge status: Home / Self Care | Payer: Medicare HMO | Attending: Emergency Medicine | Admitting: Emergency Medicine

## 2017-05-04 DIAGNOSIS — Z87891 Personal history of nicotine dependence: Secondary | ICD-10-CM | POA: Diagnosis not present

## 2017-05-04 DIAGNOSIS — R0789 Other chest pain: Secondary | ICD-10-CM | POA: Diagnosis not present

## 2017-05-04 DIAGNOSIS — R079 Chest pain, unspecified: Secondary | ICD-10-CM | POA: Diagnosis present

## 2017-05-04 DIAGNOSIS — R749 Abnormal serum enzyme level, unspecified: Secondary | ICD-10-CM | POA: Insufficient documentation

## 2017-05-04 DIAGNOSIS — E039 Hypothyroidism, unspecified: Secondary | ICD-10-CM | POA: Insufficient documentation

## 2017-05-04 DIAGNOSIS — R748 Abnormal levels of other serum enzymes: Secondary | ICD-10-CM

## 2017-05-04 DIAGNOSIS — I5032 Chronic diastolic (congestive) heart failure: Secondary | ICD-10-CM | POA: Insufficient documentation

## 2017-05-04 DIAGNOSIS — I110 Hypertensive heart disease with heart failure: Secondary | ICD-10-CM | POA: Diagnosis not present

## 2017-05-04 DIAGNOSIS — E119 Type 2 diabetes mellitus without complications: Secondary | ICD-10-CM | POA: Insufficient documentation

## 2017-05-04 DIAGNOSIS — J449 Chronic obstructive pulmonary disease, unspecified: Secondary | ICD-10-CM | POA: Insufficient documentation

## 2017-05-04 DIAGNOSIS — Z79899 Other long term (current) drug therapy: Secondary | ICD-10-CM | POA: Insufficient documentation

## 2017-05-04 LAB — LIPASE, BLOOD: LIPASE: 144 U/L — AB (ref 11–51)

## 2017-05-04 LAB — BASIC METABOLIC PANEL
Anion gap: 8 (ref 5–15)
BUN: 22 mg/dL — AB (ref 6–20)
CO2: 29 mmol/L (ref 22–32)
CREATININE: 0.9 mg/dL (ref 0.44–1.00)
Calcium: 9.3 mg/dL (ref 8.9–10.3)
Chloride: 102 mmol/L (ref 101–111)
GFR calc Af Amer: >60 mL/min (ref >60)
GFR calc non Af Amer: >60 mL/min (ref >60)
GLUCOSE: 186 mg/dL — AB (ref 65–99)
Potassium: 3.9 mmol/L (ref 3.5–5.1)
SODIUM: 139 mmol/L (ref 135–145)

## 2017-05-04 LAB — CBC
HCT: 40.7 % (ref 35.0–47.0)
Hemoglobin: 14 g/dL (ref 12.0–16.0)
MCH: 30.8 pg (ref 26.0–34.0)
MCHC: 34.3 g/dL (ref 32.0–36.0)
MCV: 89.8 fL (ref 80.0–100.0)
PLATELETS: 95 10*3/uL — AB (ref 150–440)
RBC: 4.53 MIL/uL (ref 3.80–5.20)
RDW: 14.6 % — AB (ref 11.5–14.5)
WBC: 5.5 10*3/uL (ref 3.6–11.0)

## 2017-05-04 LAB — HEPATIC FUNCTION PANEL
ALBUMIN: 3.8 g/dL (ref 3.5–5.0)
ALT: 16 U/L (ref 14–54)
AST: 24 U/L (ref 15–41)
Alkaline Phosphatase: 61 U/L (ref 38–126)
BILIRUBIN DIRECT: 0.3 mg/dL (ref 0.1–0.5)
BILIRUBIN INDIRECT: 2 mg/dL — AB (ref 0.3–0.9)
BILIRUBIN TOTAL: 2.3 mg/dL — AB (ref 0.3–1.2)
Total Protein: 7 g/dL (ref 6.5–8.1)

## 2017-05-04 LAB — TROPONIN I: Troponin I: <0.03 ng/mL (ref <0.03)

## 2017-05-04 NOTE — Discharge Instructions (Addendum)
If you have increased pain, shortness of breath, nausea, vomiting or you feel worse in any way please return to the emergency department.  Follow closely with primary care and your cardiologist will see you  first thing in the morning tomorrow at 9 AM to further evaluate you

## 2017-05-04 NOTE — ED Triage Notes (Signed)
Pt to ED via [facility] from home for central chest pain that radiates into her left arm, pain started around 0930. Pt currently rating pain 4/10. Pt denies N/V and shortness of breath. Pt A & O x 4 in NAD

## 2017-05-04 NOTE — ED Provider Notes (Addendum)
[facility] Emergency Department Provider Note  ____________________________________________   I have reviewed the triage vital signs and the nursing notes. Where available I have reviewed prior notes and, if possible and indicated, outside hospital notes.    HISTORY  Chief Complaint Chest Pain    HPI Theresa Leon is a 62 y.o. female with a history of diabetes, CHF, aortic valve replacement, bovine, nonangulated, morbid obesity, but no known CAD, who had valves placed in 2007 with no documented CAD per last cardiology note here.  Presents today with a squeezing discomfort that happened while she picked up something heavy doing laundry.  It is reproducible in the left chest wall.  It goes towards the shoulder.  She denies any shortness of breath with this.  She has had no antecedent dyspnea.  Patient had negative echo for somewhat similar symptoms a little over a month ago during last admission.  She has not had a heart cath however since 2007.  Patient denies any other radiation of the pain, it is very localized to the pectoralis muscle below the pacemaker.  She denies any nausea or vomiting or diaphoresis.  She feels completely better right now unless she touches it or changes position the wrong way and then it hurts again.  She is worried that may be this very focal chest wall discomfort is somehow involving her pacemaker but it is actually away from the pacemaker site     Past Medical History:  Diagnosis Date  . Acute on chronic renal failure ([place])   . Anemia   . Asthma 1975  . COPD (chronic obstructive pulmonary disease) ([place])   . Diabetes ([place])   . Diabetes mellitus without complication ([place]) 4665  . Diastolic CHF, acute on chronic (HCC)   . H/O aortic valve replacement   . Heart disease   . Hypothyroid   . Morbid obesity ([place])   . 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 OSA (obstructive sleep apnea)     Patient  Active Problem List   Diagnosis Date Noted  . Chest pain 03/05/2017  . V tach ([place]) 09/18/2015  . Iron deficiency anemia due to chronic blood loss 05/03/2015  . Gastritis due to nonsteroidal anti-inflammatory drug (NSAID) 03/05/2015  . History of esophagogastroduodenoscopy (EGD) 03/05/2015  . Type 2 diabetes mellitus ([place]) 03/01/2015  . GERD (gastroesophageal reflux disease) 03/01/2015  . HLD (hyperlipidemia) 03/01/2015  . COPD (chronic obstructive pulmonary disease) ([place]) 03/01/2015  . Chronic diastolic CHF (congestive heart failure) ([place]) 03/01/2015  . Hypothyroidism 03/01/2015  . OSA on CPAP 03/01/2015  . Acute posthemorrhagic anemia 03/01/2015  . Dysphagia   . CHF (congestive heart failure) ([place])   . Acute respiratory failure with hypoxia ([place]) 01/26/2014  . Encounter for imaging study to confirm orogastric (OG) tube placement   . Respiratory failure ([place])   . 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 aortic valve relaced   2008  . BACK SURGERY     AS CHILD  . BREAST BIOPSY Left 04/24/2011   left breast bx neg done by dr byrnett  . BREAST SURGERY  September 25, 2011   intraductal papilloma of the left breast  . CHOLECYSTECTOMY  2007  . COLONOSCOPY WITH PROPOFOL N/A 08/04/2014   Procedure: COLONOSCOPY WITH PROPOFOL;  Surgeon: Manya Silvas, MD;  Location[facility] ENDOSCOPY;  Service: Endoscopy;  Laterality: N/A;  . ESOPHAGOGASTRODUODENOSCOPY  Left 03/03/2015   Procedure: ESOPHAGOGASTRODUODENOSCOPY (EGD);  Surgeon: Manya Silvas, MD;  Location[facility] ENDOSCOPY;  Service: Endoscopy;  Laterality: Left;  . ESOPHAGOGASTRODUODENOSCOPY (EGD) WITH PROPOFOL  08/04/2014   Procedure: ESOPHAGOGASTRODUODENOSCOPY (EGD) WITH PROPOFOL;  Surgeon: Manya Silvas, MD;  Location: [facility] ENDOSCOPY;  Service: Endoscopy;;  . ESOPHAGOGASTRODUODENOSCOPY (EGD) WITH PROPOFOL N/A 04/09/2016   Procedure: ESOPHAGOGASTRODUODENOSCOPY (EGD) WITH  PROPOFOL;  Surgeon: Lucilla Lame, MD;  Location: [facility] ENDOSCOPY;  Service: Endoscopy;  Laterality: N/A;  . PHRENIC NERVE PACEMAKER IMPLANTATION  2009  . TRACHEOSTOMY TUBE PLACEMENT N/A 02/04/2014   Procedure: TRACHEOSTOMY;  Surgeon: Rozetta Nunnery, MD;  Location: [place];  Service: ENT;  Laterality: N/A;    Prior to Admission medications   Medication Sig Start Date End Date Taking? Authorizing Provider  acetaminophen (TYLENOL) 500 MG tablet Take 1,000 mg by mouth every 6 (six) hours as needed for mild pain or headache.    [provider]  albuterol (PROVENTIL HFA;VENTOLIN HFA) 108 (90 BASE) MCG/ACT inhaler Inhale 2 puffs into the lungs every 6 (six) hours as needed for wheezing or shortness of breath.     [provider]  amoxicillin (AMOXIL) 500 MG capsule Take 1 capsule by mouth as needed for other. Takes prior to dental procedures 03/07/16   [provider]  atorvastatin (LIPITOR) 80 MG tablet Take 1 tablet (80 mg total) by mouth daily at 6 PM. 03/07/17   Epifanio Lesches, MD  Calcium Carbonate-Vitamin D (CALCIUM 600+D) 600-400 MG-UNIT tablet Take 1 tablet by mouth at bedtime.    [provider]  cholecalciferol (VITAMIN D) 1000 units tablet Take 1,000 Units by mouth daily.    [provider]  citalopram (CELEXA) 40 MG tablet Take 40 mg by mouth daily.    [provider]  EPINEPHrine (EPIPEN 2-PAK) 0.3 mg/0.3 mL IJ SOAJ injection Inject 0.3 mg into the muscle once as needed (for severe allergic reaction).    [provider]  fluticasone (FLONASE) 50 MCG/ACT nasal spray Place 2 sprays into both nostrils daily as needed for rhinitis.     [provider]  gabapentin (NEURONTIN) 600 MG tablet Take 600 mg by mouth 4 (four) times daily.     [provider]  levothyroxine (SYNTHROID, LEVOTHROID) 125 MCG tablet Take 100 mcg by mouth daily before breakfast.     [provider]  loratadine (CLARITIN) 10 MG tablet  Take 10 mg by mouth daily.    [provider]  metFORMIN (GLUCOPHAGE-XR) 500 MG 24 hr tablet Take 500 mg by mouth 2 (two) times daily.     [provider]  montelukast (SINGULAIR) 10 MG tablet Take 10 mg by mouth at bedtime.    [provider]  Oxycodone HCl 10 MG TABS Take 10 mg by mouth 3 (three) times daily as needed for severe pain.     [provider]  sucralfate (CARAFATE) 1 g tablet Take 1 tablet (1 g total) by mouth 4 (four) times daily -  with meals and at bedtime. 03/05/15   Theodoro Grist, MD  traZODone (DESYREL) 50 MG tablet Take 50 mg by mouth at bedtime.     [provider]  Vitamin D, Ergocalciferol, (DRISDOL) 50000 units CAPS capsule Take 50,000 Units by mouth every 7 (seven) days. Fridays    [provider]    Allergies Furosemide; Mold extract [trichophyton]; Tetanus toxoids; Qvar [beclomethasone]; Toprol xl [metoprolol tartrate]; Hctz [hydrochlorothiazide]; Nickel; Nitroglycerin; and Other  Family History  Problem Relation Age of  Onset  . Skin telangiectasia Mother   . Lung cancer Maternal Grandmother   . Breast cancer Maternal Grandmother   . Lung cancer Paternal Grandmother     Social History Social History   Tobacco Use  . Smoking status: Former Research scientist (life sciences)  . Smokeless tobacco: Never Used  Substance Use Topics  . Alcohol use: No  . Drug use: No    Review of Systems Constitutional: No fever/chills Eyes: No visual changes. ENT: No sore throat. No stiff neck no neck pain Cardiovascular: + chest pain. Respiratory: Denies shortness of breath. Gastrointestinal:   no vomiting.  No diarrhea.  No constipation. Genitourinary: Negative for dysuria. Musculoskeletal: Negative lower extremity swelling Skin: Negative for rash. Neurological: Negative for severe headaches, focal weakness or numbness.   ____________________________________________   PHYSICAL EXAM:  VITAL SIGNS: ED Triage Vitals [05/04/17 1138]  Enc  Vitals Group     BP 119/69     Pulse Rate 63     Resp 16     Temp 98.3 F (36.8 C)     Temp Source Oral     SpO2 94 %     Weight (!) 323 lb (146.5 kg)     Height 5' 7"  (1.702 m)     Head Circumference      Peak Flow      Pain Score 4     Pain Loc      Pain Edu?      Excl. in [place]?     Constitutional: Alert and oriented. Well appearing and in no acute distress.  She is somewhat anxious Eyes: Conjunctivae are normal Head: Atraumatic HEENT: No congestion/rhinnorhea. Mucous membranes are moist.  Oropharynx non-erythematous Neck:   Nontender with no meningismus, no masses, no stridor Cardiovascular: Normal rate, regular rhythm. Grossly normal heart sounds.1/6 to 2/6 systolic murmur radiating to right and left upper sternal borders.  Good peripheral circulation. Respiratory: Normal respiratory effort.  No retractions. Lungs CTAB. Abdominal: Soft and nontender. No distention. No guarding no rebound Chest tender to palpation left chest wall much necessary patient states "ouch that the pain right there" and pulls back.  There is no flail chest there is no lesions there is no abscess or shingles noted.  Is not at the site of the pacemaker there is no redness or any other concerns around the pacemaker site itself.  This is in the pectoralis muscles and goes up towards the shoulder.  Also elicitable with ranging of the left arm. Back:  There is no focal tenderness or step off.  there is no midline tenderness there are no lesions noted. there is no CVA tenderness Musculoskeletal: No lower extremity tenderness, no upper extremity tenderness. No joint effusions, no DVT signs strong distal pulses no edema Neurologic:  Normal speech and language. No gross focal neurologic deficits are appreciated.  Skin:  Skin is warm, dry and intact. No rash noted. Psychiatric: Mood and affect are normal. Speech and behavior are normal.  ____________________________________________   LABS (all labs ordered are  listed, but only abnormal results are displayed)  Labs Reviewed  BASIC METABOLIC PANEL - Abnormal; Notable for the following components:      Result Value   Glucose, Bld 186 (*)    BUN 22 (*)    All other components within normal limits  CBC - Abnormal; Notable for the following components:   RDW 14.6 (*)    Platelets 95 (*)    All other components within normal limits  TROPONIN I  HEPATIC  FUNCTION PANEL  LIPASE, BLOOD    Pertinent labs  results that were available during my care of the patient were reviewed by me and considered in my medical decision making (see chart for details). ____________________________________________  EKG  I personally interpreted any EKGs ordered by me or triage Sinus rhythm, rate 64, no acute ST elevation or depression, LAD noted, nonspecific ST wave ____________________________________________  RADIOLOGY  Pertinent labs & imaging results that were available during my care of the patient were reviewed by me and considered in my medical decision making (see chart for details). If possible, patient and/or family made aware of any abnormal findings.  Dg Chest 2 View  Result Date: 05/04/2017 CLINICAL DATA:  Chest pain EXAM: CHEST - 2 VIEW COMPARISON:  03/05/2017 FINDINGS: Aortic valve replacement. Pacemaker unchanged. Heart size upper normal. Negative for heart failure. Negative for infiltrate effusion or mass. Lungs are clear. IMPRESSION: No active cardiopulmonary disease. Electronically Signed   By: Franchot Gallo M.D.   On: 05/04/2017 12:07   ____________________________________________    PROCEDURES  Procedure(s) performed: None  Procedures  Critical Care performed: None  ____________________________________________   INITIAL IMPRESSION / ASSESSMENT AND PLAN / ED COURSE  Pertinent labs & imaging results that were available during my care of the patient were reviewed by me and considered in my medical decision making (see chart for  details).  Patient here with very reproducible chest wall pain unfortunately does have a great number of nothing and CAD risk factors.  She has had multiple chest pain scares over the last several years which usually have not thought to be cardiac related but that certainly does not protect her from cardiac events.  Very low suspicion for PE or dissection given the very reproducible nature of this discomfort.  We will see what cardiac enzymes are demonstrating, at this all started at 930 we will send a second set of cardiac enzymes I will discuss with her cardiologist whether they think admission for cath or further rule out would be of any value.   ----------------------------------------- 1:48 PM on 05/04/2017 -----------------------------------------  Gust with Dr. Humphrey Rolls, who knows the patient very well and it is her personal cardiologist.  He states that the patient had clean coronaries in 2008 he does not risk suspect that this represents ACS PE or dissection and he would like to see her in the office tomorrow morning at 9.  He is very familiar with this patient and she has very frequent noncardiac muscle skeletal pains in her chest which elicited concern but not intervention.  Given that the patient's personal cardiologist knows her well and feels that this is the best course of action given her exam, we will obtain one more set of cardiac markers if that is negative, we will see about trying to get her safely home.  Patient remains pain-free unless she touches her chest or moves her arm  ----------------------------------------- 3:37 PM on 05/04/2017 -----------------------------------------  This is mildly elevated, she has no reproducible abdominal pain, no vomiting, it has been elevated multiple times in the past, refer her to GI for this.  Patient will see her cardiologist tomorrow morning she has no ongoing pain unless it is specifically elicited by palpation which I think is reassuring.   Given the patient's cardiologist's preferences we will discharge her return precautions follow-up given and understood.  At this time, there does not appear to be clinical evidence to support the diagnosis of pulmonary embolus, dissection, myocarditis, endocarditis, pericarditis, pericardial tamponade, acute coronary  syndrome, pneumothorax, pneumonia, or any other acute intrathoracic pathology that will require admission or acute intervention. Nor is there evidence of any significant intra-abdominal pathology causing this discomfort.   ____________________________________________   FINAL CLINICAL IMPRESSION(S) / ED DIAGNOSES  Final diagnoses:  None      This chart was dictated using voice recognition software.  Despite best efforts to proofread,  errors can occur which can change meaning.      Schuyler Amor, MD 05/04/17 1307    Schuyler Amor, MD 05/04/17 1348    Schuyler Amor, MD 05/04/17 1538

## 2017-05-04 NOTE — ED Notes (Addendum)
Pt took 2 aspirin at home, CBG with EMS 212

## 2017-05-04 NOTE — ED Notes (Signed)
Patient transported to X-ray 

## 2017-05-09 ENCOUNTER — Other Ambulatory Visit: Payer: Self-pay | Admitting: Internal Medicine

## 2017-05-09 DIAGNOSIS — M79622 Pain in left upper arm: Secondary | ICD-10-CM

## 2017-05-14 ENCOUNTER — Encounter: Payer: Medicare HMO | Admitting: Oncology

## 2017-05-14 ENCOUNTER — Encounter: Payer: Self-pay | Admitting: Oncology

## 2017-05-23 ENCOUNTER — Encounter: Payer: Medicare HMO | Admitting: Oncology

## 2017-06-04 ENCOUNTER — Ambulatory Visit: Payer: Medicare HMO

## 2017-06-26 ENCOUNTER — Emergency Department: Payer: Medicare HMO

## 2017-06-26 ENCOUNTER — Inpatient Hospital Stay
Admission: EM | Admit: 2017-06-26 | Discharge: 2017-06-29 | DRG: 189 | Disposition: A | Discharge status: Home / Self Care | Payer: Medicare HMO | Attending: Internal Medicine | Admitting: Internal Medicine

## 2017-06-26 ENCOUNTER — Other Ambulatory Visit: Payer: Self-pay

## 2017-06-26 ENCOUNTER — Encounter: Payer: Self-pay | Admitting: Radiology

## 2017-06-26 DIAGNOSIS — B955 Unspecified streptococcus as the cause of diseases classified elsewhere: Secondary | ICD-10-CM | POA: Diagnosis present

## 2017-06-26 DIAGNOSIS — Z953 Presence of xenogenic heart valve: Secondary | ICD-10-CM | POA: Diagnosis not present

## 2017-06-26 DIAGNOSIS — I5032 Chronic diastolic (congestive) heart failure: Secondary | ICD-10-CM | POA: Diagnosis present

## 2017-06-26 DIAGNOSIS — N189 Chronic kidney disease, unspecified: Secondary | ICD-10-CM | POA: Diagnosis present

## 2017-06-26 DIAGNOSIS — R000 Tachycardia, unspecified: Secondary | ICD-10-CM | POA: Diagnosis present

## 2017-06-26 DIAGNOSIS — K7460 Unspecified cirrhosis of liver: Secondary | ICD-10-CM | POA: Diagnosis present

## 2017-06-26 DIAGNOSIS — Z9989 Dependence on other enabling machines and devices: Secondary | ICD-10-CM

## 2017-06-26 DIAGNOSIS — R040 Epistaxis: Secondary | ICD-10-CM | POA: Diagnosis present

## 2017-06-26 DIAGNOSIS — Z79899 Other long term (current) drug therapy: Secondary | ICD-10-CM

## 2017-06-26 DIAGNOSIS — K7581 Nonalcoholic steatohepatitis (NASH): Secondary | ICD-10-CM | POA: Diagnosis present

## 2017-06-26 DIAGNOSIS — D6959 Other secondary thrombocytopenia: Secondary | ICD-10-CM | POA: Diagnosis present

## 2017-06-26 DIAGNOSIS — D631 Anemia in chronic kidney disease: Secondary | ICD-10-CM | POA: Diagnosis present

## 2017-06-26 DIAGNOSIS — E1122 Type 2 diabetes mellitus with diabetic chronic kidney disease: Secondary | ICD-10-CM | POA: Diagnosis present

## 2017-06-26 DIAGNOSIS — I4581 Long QT syndrome: Secondary | ICD-10-CM | POA: Diagnosis present

## 2017-06-26 DIAGNOSIS — I959 Hypotension, unspecified: Secondary | ICD-10-CM | POA: Diagnosis present

## 2017-06-26 DIAGNOSIS — I350 Nonrheumatic aortic (valve) stenosis: Secondary | ICD-10-CM | POA: Diagnosis not present

## 2017-06-26 DIAGNOSIS — J9601 Acute respiratory failure with hypoxia: Principal | ICD-10-CM | POA: Diagnosis present

## 2017-06-26 DIAGNOSIS — R7881 Bacteremia: Secondary | ICD-10-CM | POA: Diagnosis present

## 2017-06-26 DIAGNOSIS — J441 Chronic obstructive pulmonary disease with (acute) exacerbation: Secondary | ICD-10-CM | POA: Diagnosis present

## 2017-06-26 DIAGNOSIS — Z79891 Long term (current) use of opiate analgesic: Secondary | ICD-10-CM

## 2017-06-26 DIAGNOSIS — Z6841 Body Mass Index (BMI) 40.0 and over, adult: Secondary | ICD-10-CM

## 2017-06-26 DIAGNOSIS — Z888 Allergy status to other drugs, medicaments and biological substances status: Secondary | ICD-10-CM

## 2017-06-26 DIAGNOSIS — Z930 Tracheostomy status: Secondary | ICD-10-CM

## 2017-06-26 DIAGNOSIS — E039 Hypothyroidism, unspecified: Secondary | ICD-10-CM | POA: Diagnosis present

## 2017-06-26 DIAGNOSIS — H6690 Otitis media, unspecified, unspecified ear: Secondary | ICD-10-CM | POA: Diagnosis present

## 2017-06-26 DIAGNOSIS — Z887 Allergy status to serum and vaccine status: Secondary | ICD-10-CM

## 2017-06-26 DIAGNOSIS — R0902 Hypoxemia: Secondary | ICD-10-CM | POA: Diagnosis present

## 2017-06-26 DIAGNOSIS — Z87891 Personal history of nicotine dependence: Secondary | ICD-10-CM

## 2017-06-26 DIAGNOSIS — Z969 Presence of functional implant, unspecified: Secondary | ICD-10-CM | POA: Diagnosis present

## 2017-06-26 DIAGNOSIS — E662 Morbid (severe) obesity with alveolar hypoventilation: Secondary | ICD-10-CM | POA: Diagnosis present

## 2017-06-26 DIAGNOSIS — Z7984 Long term (current) use of oral hypoglycemic drugs: Secondary | ICD-10-CM

## 2017-06-26 DIAGNOSIS — Z91048 Other nonmedicinal substance allergy status: Secondary | ICD-10-CM

## 2017-06-26 HISTORY — DX: Nonalcoholic steatohepatitis (NASH): K75.81

## 2017-06-26 HISTORY — DX: Unspecified cirrhosis of liver: K74.60

## 2017-06-26 LAB — COMPREHENSIVE METABOLIC PANEL
ALK PHOS: 72 U/L (ref 38–126)
ALT: 14 U/L (ref 14–54)
AST: 20 U/L (ref 15–41)
Albumin: 3.8 g/dL (ref 3.5–5.0)
Anion gap: 9 (ref 5–15)
BILIRUBIN TOTAL: 1.9 mg/dL — AB (ref 0.3–1.2)
BUN: 19 mg/dL (ref 6–20)
CALCIUM: 8.4 mg/dL — AB (ref 8.9–10.3)
CO2: 30 mmol/L (ref 22–32)
Chloride: 99 mmol/L — ABNORMAL LOW (ref 101–111)
Creatinine, Ser: 1.27 mg/dL — ABNORMAL HIGH (ref 0.44–1.00)
GFR calc Af Amer: 52 mL/min — ABNORMAL LOW (ref >60)
GFR, EST NON AFRICAN AMERICAN: 45 mL/min — AB (ref >60)
GLUCOSE: 213 mg/dL — AB (ref 65–99)
Potassium: 4 mmol/L (ref 3.5–5.1)
Sodium: 138 mmol/L (ref 135–145)
TOTAL PROTEIN: 7.1 g/dL (ref 6.5–8.1)

## 2017-06-26 LAB — URINALYSIS, COMPLETE (UACMP) WITH MICROSCOPIC
Bacteria, UA: NONE SEEN
Bilirubin Urine: NEGATIVE
Glucose, UA: NEGATIVE mg/dL
Hgb urine dipstick: NEGATIVE
KETONES UR: NEGATIVE mg/dL
Nitrite: NEGATIVE
Protein, ur: NEGATIVE mg/dL
Specific Gravity, Urine: >1.046 — ABNORMAL HIGH (ref 1.005–1.030)
pH: 5 (ref 5.0–8.0)

## 2017-06-26 LAB — CBC WITH DIFFERENTIAL/PLATELET
BASOS ABS: 0 10*3/uL (ref 0–0.1)
BASOS PCT: 1 %
EOS ABS: 0.2 10*3/uL (ref 0–0.7)
EOS PCT: 2 %
HEMATOCRIT: 42.6 % (ref 35.0–47.0)
Hemoglobin: 14.5 g/dL (ref 12.0–16.0)
Lymphocytes Relative: 12 %
Lymphs Abs: 1.1 10*3/uL (ref 1.0–3.6)
MCH: 31.3 pg (ref 26.0–34.0)
MCHC: 34.1 g/dL (ref 32.0–36.0)
MCV: 91.8 fL (ref 80.0–100.0)
MONO ABS: 0.5 10*3/uL (ref 0.2–0.9)
MONOS PCT: 6 %
NEUTROS ABS: 7.5 10*3/uL — AB (ref 1.4–6.5)
Neutrophils Relative %: 79 %
PLATELETS: 94 10*3/uL — AB (ref 150–440)
RBC: 4.64 MIL/uL (ref 3.80–5.20)
RDW: 13.5 % (ref 11.5–14.5)
WBC: 9.4 10*3/uL (ref 3.6–11.0)

## 2017-06-26 LAB — GLUCOSE, CAPILLARY
GLUCOSE-CAPILLARY: 110 mg/dL — AB (ref 65–99)
GLUCOSE-CAPILLARY: 252 mg/dL — AB (ref 65–99)
GLUCOSE-CAPILLARY: 387 mg/dL — AB (ref 65–99)

## 2017-06-26 LAB — LIPASE, BLOOD: LIPASE: 59 U/L — AB (ref 11–51)

## 2017-06-26 LAB — INFLUENZA PANEL BY PCR (TYPE A & B)
INFLAPCR: NEGATIVE
Influenza B By PCR: NEGATIVE

## 2017-06-26 LAB — HEMOGLOBIN A1C
Hgb A1c MFr Bld: 6.2 % — ABNORMAL HIGH (ref 4.8–5.6)
Mean Plasma Glucose: 131.24 mg/dL

## 2017-06-26 LAB — AMMONIA: AMMONIA: 24 umol/L (ref 9–35)

## 2017-06-26 LAB — CK: Total CK: 49 U/L (ref 38–234)

## 2017-06-26 MED ORDER — SUCRALFATE 1 G PO TABS
1.0000 g | ORAL_TABLET | Freq: Three times a day (TID) | ORAL | Status: DC
Start: 2017-06-26 — End: 2017-06-29
  Administered 2017-06-26 – 2017-06-29 (×12): 1 g via ORAL
  Filled 2017-06-26 (×12): qty 1

## 2017-06-26 MED ORDER — ONDANSETRON HCL 4 MG/2ML IJ SOLN: 4.0000 mg | Freq: Four times a day (QID) | INTRAMUSCULAR | Status: DC | PRN

## 2017-06-26 MED ORDER — LACTATED RINGERS IV SOLN
INTRAVENOUS | Status: DC
  Administered 2017-06-26 – 2017-06-27 (×3): via INTRAVENOUS

## 2017-06-26 MED ORDER — BUDESONIDE 0.5 MG/2ML IN SUSP
0.5000 mg | Freq: Two times a day (BID) | RESPIRATORY_TRACT | Status: DC
  Administered 2017-06-26 – 2017-06-29 (×6): 0.5 mg via RESPIRATORY_TRACT
  Filled 2017-06-26 (×6): qty 2

## 2017-06-26 MED ORDER — ALBUTEROL SULFATE (2.5 MG/3ML) 0.083% IN NEBU
5.0000 mg | INHALATION_SOLUTION | Freq: Once | RESPIRATORY_TRACT | Status: AC
  Administered 2017-06-26: 5 mg via RESPIRATORY_TRACT
  Filled 2017-06-26: qty 6

## 2017-06-26 MED ORDER — CALCIUM CARBONATE-VITAMIN D 600-400 MG-UNIT PO TABS: 1.0000 | ORAL_TABLET | Freq: Every day | ORAL | Status: DC

## 2017-06-26 MED ORDER — TRAZODONE HCL 50 MG PO TABS
50.0000 mg | ORAL_TABLET | Freq: Every day | ORAL | Status: DC
  Administered 2017-06-26 – 2017-06-28 (×3): 50 mg via ORAL
  Filled 2017-06-26 (×3): qty 1

## 2017-06-26 MED ORDER — FLUTICASONE PROPIONATE 50 MCG/ACT NA SUSP
2.0000 | Freq: Every day | NASAL | Status: DC | PRN
  Filled 2017-06-26: qty 16

## 2017-06-26 MED ORDER — CEFTRIAXONE SODIUM 2 G IJ SOLR
2.0000 g | Freq: Once | INTRAMUSCULAR | Status: AC
  Administered 2017-06-26: 2 g via INTRAVENOUS
  Filled 2017-06-26: qty 2

## 2017-06-26 MED ORDER — IPRATROPIUM-ALBUTEROL 0.5-2.5 (3) MG/3ML IN SOLN
3.0000 mL | Freq: Once | RESPIRATORY_TRACT | Status: AC
  Administered 2017-06-26: 3 mL via RESPIRATORY_TRACT
  Filled 2017-06-26: qty 3

## 2017-06-26 MED ORDER — SODIUM CHLORIDE 0.9 % IV SOLN
500.0000 mg | Freq: Once | INTRAVENOUS | Status: AC
  Administered 2017-06-26: 500 mg via INTRAVENOUS
  Filled 2017-06-26: qty 500

## 2017-06-26 MED ORDER — METHYLPREDNISOLONE SODIUM SUCC 125 MG IJ SOLR
125.0000 mg | Freq: Once | INTRAMUSCULAR | Status: AC
  Administered 2017-06-26: 125 mg via INTRAVENOUS
  Filled 2017-06-26: qty 2

## 2017-06-26 MED ORDER — SODIUM CHLORIDE 0.9 % IV BOLUS
1000.0000 mL | Freq: Once | INTRAVENOUS | Status: AC
  Administered 2017-06-26: 1000 mL via INTRAVENOUS

## 2017-06-26 MED ORDER — VITAMIN D 1000 UNITS PO TABS
1000.0000 [IU] | ORAL_TABLET | Freq: Every day | ORAL | Status: DC
  Administered 2017-06-27 – 2017-06-29 (×3): 1000 [IU] via ORAL
  Filled 2017-06-26 (×3): qty 1

## 2017-06-26 MED ORDER — OXYCODONE HCL 5 MG PO TABS
10.0000 mg | ORAL_TABLET | Freq: Three times a day (TID) | ORAL | Status: DC | PRN
  Administered 2017-06-26 – 2017-06-29 (×5): 10 mg via ORAL
  Filled 2017-06-26 (×5): qty 2

## 2017-06-26 MED ORDER — SODIUM CHLORIDE 0.9 % IV SOLN
1.0000 g | INTRAVENOUS | Status: DC
  Filled 2017-06-26: qty 10

## 2017-06-26 MED ORDER — LORATADINE 10 MG PO TABS
10.0000 mg | ORAL_TABLET | Freq: Every day | ORAL | Status: DC
  Administered 2017-06-27 – 2017-06-29 (×3): 10 mg via ORAL
  Filled 2017-06-26 (×3): qty 1

## 2017-06-26 MED ORDER — CITALOPRAM HYDROBROMIDE 20 MG PO TABS
40.0000 mg | ORAL_TABLET | Freq: Every day | ORAL | Status: DC
  Administered 2017-06-27 – 2017-06-29 (×3): 40 mg via ORAL
  Filled 2017-06-26 (×3): qty 2

## 2017-06-26 MED ORDER — METHYLPREDNISOLONE SODIUM SUCC 40 MG IJ SOLR
40.0000 mg | Freq: Every day | INTRAMUSCULAR | Status: DC
  Administered 2017-06-27 – 2017-06-29 (×3): 40 mg via INTRAVENOUS
  Filled 2017-06-26 (×3): qty 1

## 2017-06-26 MED ORDER — AZITHROMYCIN 500 MG PO TABS
250.0000 mg | ORAL_TABLET | Freq: Every day | ORAL | Status: DC
  Administered 2017-06-26: 16:00:00 250 mg via ORAL
  Filled 2017-06-26: qty 1

## 2017-06-26 MED ORDER — INSULIN ASPART 100 UNIT/ML ~~LOC~~ SOLN
0.0000 [IU] | Freq: Every day | SUBCUTANEOUS | Status: DC
  Administered 2017-06-26: 22:00:00 5 [IU] via SUBCUTANEOUS
  Administered 2017-06-27 – 2017-06-28 (×2): 3 [IU] via SUBCUTANEOUS
  Filled 2017-06-26 (×3): qty 1

## 2017-06-26 MED ORDER — IPRATROPIUM-ALBUTEROL 0.5-2.5 (3) MG/3ML IN SOLN
3.0000 mL | Freq: Four times a day (QID) | RESPIRATORY_TRACT | Status: DC
  Administered 2017-06-26 – 2017-06-28 (×6): 3 mL via RESPIRATORY_TRACT
  Filled 2017-06-26 (×8): qty 3

## 2017-06-26 MED ORDER — INSULIN ASPART 100 UNIT/ML ~~LOC~~ SOLN
0.0000 [IU] | Freq: Three times a day (TID) | SUBCUTANEOUS | Status: DC
  Administered 2017-06-26: 5 [IU] via SUBCUTANEOUS
  Administered 2017-06-27 (×2): 7 [IU] via SUBCUTANEOUS
  Administered 2017-06-27: 5 [IU] via SUBCUTANEOUS
  Administered 2017-06-28: 2 [IU] via SUBCUTANEOUS
  Administered 2017-06-28: 5 [IU] via SUBCUTANEOUS
  Administered 2017-06-28: 7 [IU] via SUBCUTANEOUS
  Administered 2017-06-29: 2 [IU] via SUBCUTANEOUS
  Administered 2017-06-29: 12:00:00 3 [IU] via SUBCUTANEOUS
  Filled 2017-06-26 (×9): qty 1

## 2017-06-26 MED ORDER — ACETAMINOPHEN 500 MG PO TABS
1000.0000 mg | ORAL_TABLET | Freq: Four times a day (QID) | ORAL | Status: DC | PRN
  Administered 2017-06-26: 16:00:00 1000 mg via ORAL
  Filled 2017-06-26: qty 2

## 2017-06-26 MED ORDER — LEVOTHYROXINE SODIUM 50 MCG PO TABS
100.0000 ug | ORAL_TABLET | Freq: Every day | ORAL | Status: DC
Start: 2017-06-27 — End: 2017-06-29
  Administered 2017-06-27 – 2017-06-29 (×3): 100 ug via ORAL
  Filled 2017-06-26 (×3): qty 2

## 2017-06-26 MED ORDER — VITAMIN D (ERGOCALCIFEROL) 1.25 MG (50000 UNIT) PO CAPS
50000.0000 [IU] | ORAL_CAPSULE | ORAL | Status: DC
  Administered 2017-06-27: 08:00:00 50000 [IU] via ORAL
  Filled 2017-06-26: qty 1

## 2017-06-26 MED ORDER — ACETAMINOPHEN 325 MG PO TABS
650.0000 mg | ORAL_TABLET | Freq: Once | ORAL | Status: AC
  Administered 2017-06-26: 650 mg via ORAL

## 2017-06-26 MED ORDER — ONDANSETRON HCL 4 MG PO TABS: 4.0000 mg | ORAL_TABLET | Freq: Four times a day (QID) | ORAL | Status: DC | PRN

## 2017-06-26 MED ORDER — MONTELUKAST SODIUM 10 MG PO TABS
10.0000 mg | ORAL_TABLET | Freq: Every day | ORAL | Status: DC
  Administered 2017-06-26 – 2017-06-28 (×3): 10 mg via ORAL
  Filled 2017-06-26 (×3): qty 1

## 2017-06-26 MED ORDER — CALCIUM CARBONATE-VITAMIN D 500-200 MG-UNIT PO TABS
1.0000 | ORAL_TABLET | Freq: Every day | ORAL | Status: DC
  Administered 2017-06-26 – 2017-06-28 (×3): 1 via ORAL
  Filled 2017-06-26 (×3): qty 1

## 2017-06-26 MED ORDER — ACETAMINOPHEN 500 MG PO TABS: 1000.0000 mg | ORAL_TABLET | Freq: Four times a day (QID) | ORAL | Status: DC | PRN

## 2017-06-26 MED ORDER — IOPAMIDOL (ISOVUE-370) INJECTION 76%
75.0000 mL | Freq: Once | INTRAVENOUS | Status: AC | PRN
  Administered 2017-06-26: 75 mL via INTRAVENOUS

## 2017-06-26 MED ORDER — GABAPENTIN 600 MG PO TABS
600.0000 mg | ORAL_TABLET | Freq: Four times a day (QID) | ORAL | Status: DC
  Administered 2017-06-26 – 2017-06-29 (×10): 600 mg via ORAL
  Filled 2017-06-26 (×11): qty 1

## 2017-06-26 MED ORDER — ACETAMINOPHEN 325 MG PO TABS
ORAL_TABLET | ORAL | Status: AC
  Filled 2017-06-26: qty 2

## 2017-06-26 MED ORDER — ENOXAPARIN SODIUM 40 MG/0.4ML ~~LOC~~ SOLN
40.0000 mg | SUBCUTANEOUS | Status: DC
  Administered 2017-06-26 – 2017-06-28 (×3): 40 mg via SUBCUTANEOUS
  Filled 2017-06-26 (×3): qty 0.4

## 2017-06-26 NOTE — ED Notes (Signed)
After ambulating to the BR - pt sats 91% on RA  Pt placed back on 1L

## 2017-06-26 NOTE — ED Provider Notes (Signed)
Did not see this patient but the patient was complaining of racing heart so the nurse obtained an EKG:  ED ECG REPORT I, Eula Listen, the attending physician, personally viewed and interpreted this ECG.   Date: 06/26/2017  EKG Time: 1507  Rate: 108  Rhythm: Paced with tachycardia  Axis: leftward  Intervals:prolonged QTc  ST&T Change: paced    Eula Listen, MD 06/26/17 [phone]

## 2017-06-26 NOTE — H&P (Signed)
[facility] [place] NAME: Theresa Leon    MR#:  [id number]  DATE OF BIRTH:  1955/06/25  DATE OF ADMISSION:  06/26/2017  PRIMARY CARE PHYSICIAN: Perrin Maltese, MD   REQUESTING/REFERRING PHYSICIAN: Dr Maisie Fus  CHIEF COMPLAINT:   Chief Complaint  Patient presents with  . Shortness of Breath    HISTORY OF PRESENT ILLNESS:  Theresa Leon  is a 62 y.o. female presents with not feeling well at all.  She woke up with her heart racing.  She sat up and then developed a nosebleed.  She was shaking all over really bad.  She took her normal medications this morning.  She was very thirsty and drank a quart of tea.  She was still with shaking and then had trouble breathing.  She is dizzy and shaky on her feet.  She is been under a lot of stress with her father who was admitted to a psych ward.  Hospitalist services were contacted for further evaluation.  With EMS she had a fever of 101.5.  PAST MEDICAL HISTORY:   Past Medical History:  Diagnosis Date  . Acute on chronic renal failure ([place])   . Anemia   . Asthma 1975  . COPD (chronic obstructive pulmonary disease) ([place])   . Diabetes ([place])   . Diabetes mellitus without complication ([place]) 5537  . Diastolic CHF, acute on chronic ([facility])   . H/O aortic valve replacement   . Heart disease   . Hypothyroid   . Liver cirrhosis secondary to NASH (nonalcoholic steatohepatitis) ([place])   . Morbid obesity ([place])   . 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 OSA (obstructive sleep apnea)     PAST SURGICAL HISTORY:   Past Surgical History:  Procedure Laterality Date  . aortic valve relaced   2008  . BACK SURGERY     AS CHILD  . BREAST BIOPSY Left 04/24/2011   left breast bx neg done by dr byrnett  . BREAST SURGERY  September 25, 2011   intraductal papilloma of the left breast  . CHOLECYSTECTOMY  2007  . COLONOSCOPY WITH PROPOFOL N/A 08/04/2014   Procedure:  COLONOSCOPY WITH PROPOFOL;  Surgeon: Manya Silvas, MD;  Location: [facility] ENDOSCOPY;  Service: Endoscopy;  Laterality: N/A;  . ESOPHAGOGASTRODUODENOSCOPY Left 03/03/2015   Procedure: ESOPHAGOGASTRODUODENOSCOPY (EGD);  Surgeon: Manya Silvas, MD;  Location: [facility] ENDOSCOPY;  Service: Endoscopy;  Laterality: Left;  . ESOPHAGOGASTRODUODENOSCOPY (EGD) WITH PROPOFOL  08/04/2014   Procedure: ESOPHAGOGASTRODUODENOSCOPY (EGD) WITH PROPOFOL;  Surgeon: Manya Silvas, MD;  Location: [facility] ENDOSCOPY;  Service: Endoscopy;;  . ESOPHAGOGASTRODUODENOSCOPY (EGD) WITH PROPOFOL N/A 04/09/2016   Procedure: ESOPHAGOGASTRODUODENOSCOPY (EGD) WITH PROPOFOL;  Surgeon: Lucilla Lame, MD;  Location: [facility] ENDOSCOPY;  Service: Endoscopy;  Laterality: N/A;  . PHRENIC NERVE PACEMAKER IMPLANTATION  2009  . TRACHEOSTOMY TUBE PLACEMENT N/A 02/04/2014   Procedure: TRACHEOSTOMY;  Surgeon: Rozetta Nunnery, MD;  Location: [place];  Service: ENT;  Laterality: N/A;    SOCIAL HISTORY:   Social History   Tobacco Use  . Smoking status: Former Research scientist (life sciences)  . Smokeless tobacco: Never Used  Substance Use Topics  . Alcohol use: No    FAMILY HISTORY:   Family History  Problem Relation Age of Onset  . Skin telangiectasia Mother   . Paget's disease of bone Mother   . Lung cancer Maternal Grandmother   . Breast cancer Maternal Grandmother   . Lung  cancer Paternal Grandmother   . Alzheimer's disease Father     DRUG ALLERGIES:   Allergies  Allergen Reactions  . Furosemide Other (See Comments)    Pt states that this medication caused renal failure.    . Mold Extract [Trichophyton] Anaphylaxis  . Tetanus Toxoids Other (See Comments)    Pt states that her arm turns black.    Hofferber Crigler [Beclomethasone] Other (See Comments)    Reaction:  Thrush   . Toprol Xl [Metoprolol Tartrate] Hives  . Hctz [Hydrochlorothiazide] Other (See Comments)    Reports renal failure   . Nickel   . Nitroglycerin Other (See Comments)    Pt states that  this medication makes her BP bottom out.    . Other Hives, Swelling and Other (See Comments)    Pt states that she is allergic to steroids.      REVIEW OF SYSTEMS:  CONSTITUTIONAL: Positive for fever, and [place].  Positive for fatigue or weakness.  Positive for some weight gain. EYES: Some blurry vision. EARS, NOSE, AND THROAT: No tinnitus or ear pain. No sore throat positive epistaxis..  RESPIRATORY: No cough, shortness of breath, wheezing or hemoptysis.  CARDIOVASCULAR: No chest pain, orthopnea, edema.  GASTROINTESTINAL: No nausea, vomiting, diarrhea or abdominal pain. No blood in bowel movements GENITOURINARY: No dysuria, hematuria.  ENDOCRINE: No polyuria, nocturia,  HEMATOLOGY: No anemia, easy bruising or bleeding SKIN: No rash or lesion. MUSCULOSKELETAL: Chronic pain in the knee ankles and feet. NEUROLOGIC: Burning right leg from knee down.  Positive for dizziness. PSYCHIATRY: No anxiety or depression.   MEDICATIONS AT HOME:   Prior to Admission medications   Medication Sig Start Date End Date Taking? Authorizing Provider  acetaminophen (TYLENOL) 500 MG tablet Take 1,000 mg by mouth every 6 (six) hours as needed for mild pain or headache.    [provider]  albuterol (PROVENTIL HFA;VENTOLIN HFA) 108 (90 BASE) MCG/ACT inhaler Inhale 2 puffs into the lungs every 6 (six) hours as needed for wheezing or shortness of breath.     [provider]  amoxicillin (AMOXIL) 500 MG capsule Take 1 capsule by mouth as needed for other. Takes prior to dental procedures 03/07/16   [provider]  atorvastatin (LIPITOR) 80 MG tablet Take 1 tablet (80 mg total) by mouth daily at 6 PM. 03/07/17   Epifanio Lesches, MD  Calcium Carbonate-Vitamin D (CALCIUM 600+D) 600-400 MG-UNIT tablet Take 1 tablet by mouth at bedtime.    [provider]  cholecalciferol (VITAMIN D) 1000 units tablet Take 1,000 Units by mouth daily.    [provider]  citalopram (CELEXA)  40 MG tablet Take 40 mg by mouth daily.    [provider]  EPINEPHrine (EPIPEN 2-PAK) 0.3 mg/0.3 mL IJ SOAJ injection Inject 0.3 mg into the muscle once as needed (for severe allergic reaction).    [provider]  fluticasone (FLONASE) 50 MCG/ACT nasal spray Place 2 sprays into both nostrils daily as needed for rhinitis.     [provider]  gabapentin (NEURONTIN) 600 MG tablet Take 600 mg by mouth 4 (four) times daily.     [provider]  levothyroxine (SYNTHROID, LEVOTHROID) 100 MCG tablet Take 1 tablet by mouth daily. 04/03/17   [provider]  levothyroxine (SYNTHROID, LEVOTHROID) 125 MCG tablet Take 100 mcg by mouth daily before breakfast.     [provider]  loratadine (CLARITIN) 10 MG tablet Take 10 mg by mouth daily.    [provider]  metFORMIN (GLUCOPHAGE-XR) 500 MG 24 hr tablet Take 500 mg by mouth 2 (two) times daily.     [provider]  montelukast (SINGULAIR) 10 MG tablet Take 10 mg by mouth at bedtime.    [provider]  Oxycodone HCl 10 MG TABS Take 10 mg by mouth 3 (three) times daily as needed for severe pain.     [provider]  sucralfate (CARAFATE) 1 g tablet Take 1 tablet (1 g total) by mouth 4 (four) times daily -  with meals and at bedtime. 03/05/15   Theodoro Grist, MD  torsemide (DEMADEX) 20 MG tablet Take 1 tablet by mouth daily. 06/03/17   [provider]  traZODone (DESYREL) 50 MG tablet Take 50 mg by mouth at bedtime.     [provider]  Vitamin D, Ergocalciferol, (DRISDOL) 50000 units CAPS capsule Take 50,000 Units by mouth every 7 (seven) days. Fridays    [provider]  XTAMPZA ER 9 MG C12A Take 1 capsule by mouth 3 (three) times daily. 06/11/17   [provider]      VITAL SIGNS:  Blood pressure 96/78, pulse 90, temperature (!) 101.5 F (38.6 C), temperature source Oral, resp. rate (!) 21, height 5' 8"  (1.727 m), weight (!) 147 kg (324  lb), SpO2 93 %.  PHYSICAL EXAMINATION:  GENERAL:  62 y.o.-year-old patient lying in the bed with no acute distress.  EYES: Pupils equal, round, reactive to light and accommodation. No scleral icterus. Extraocular muscles intact.  HEENT: Head atraumatic, normocephalic. Oropharynx and nasopharynx clear.  Positive bulging tympanic membrane with slight erythema NECK:  Supple, no jugular venous distention. No thyroid enlargement, no tenderness.  LUNGS: Decreased breath sounds bilaterally, no wheezing, rales,rhonchi or crepitation. No use of accessory muscles of respiration.  CARDIOVASCULAR: S1, S2 normal.  2 out of 6 systolic murmurs, no rubs, or gallops.  ABDOMEN: Soft, nontender, nondistended. Bowel sounds present. No organomegaly or mass.  EXTREMITIES: 2+ pedal edema, cyanosis, or clubbing.  NEUROLOGIC: Cranial nerves II through XII are intact. Muscle strength 5/5 in all extremities. Sensation intact. Gait not checked.  PSYCHIATRIC: The patient is alert and oriented x 3.  SKIN: No rash, lesion, or ulcer.   LABORATORY PANEL:   CBC Recent Labs  Lab 06/26/17 0859  WBC 9.4  HGB 14.5  HCT 42.6  PLT 94*   ------------------------------------------------------------------------------------------------------------------  Chemistries  Recent Labs  Lab 06/26/17 0859  NA 138  K 4.0  CL 99*  CO2 30  GLUCOSE 213*  BUN 19  CREATININE 1.27*  CALCIUM 8.4*  AST 20  ALT 14  ALKPHOS 72  BILITOT 1.9*   ------------------------------------------------------------------------------------------------------------------    RADIOLOGY:  Dg Chest 2 View  Result Date: 06/26/2017 CLINICAL DATA:  Shortness of Breath EXAM: CHEST - 2 VIEW COMPARISON:  May 04, 2017 FINDINGS: There is no edema or consolidation. Heart size and pulmonary vascularity are within normal limits. Pacemaker leads are attached to the right atrium and right ventricle. There is an aortic valve replacement. No adenopathy. There  is degenerative change in the thoracic spine. IMPRESSION: No edema or consolidation. Heart size normal. Postoperative changes noted. Electronically Signed   By: Lowella Grip III M.D.   On: 06/26/2017 09:39   Ct Angio Chest Pe W And/or Wo Contrast  Result Date: 06/26/2017 CLINICAL DATA:  Suspect pulmonary embolism. EXAM: CT ANGIOGRAPHY CHEST WITH CONTRAST TECHNIQUE: Multidetector CT imaging of the chest was performed using the standard protocol during bolus administration of intravenous contrast. Multiplanar CT  image reconstructions and MIPs were obtained to evaluate the vascular anatomy. CONTRAST:  41m ISOVUE-370 IOPAMIDOL (ISOVUE-370) INJECTION 76% COMPARISON:  Chest x-ray 06/26/2017.  Chest CT 09/24/2016 FINDINGS: Cardiovascular:  Negative for pulmonary embolism. Mild atherosclerotic disease in the aorta. Ascending aorta 4.4 cm. Descending thoracic aorta 3.0 cm. Negative for dissection. Aortic valve replacement. Pacemaker. Negative for pericardial effusion. Heart size within normal limits. 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 Mediastinum/Nodes: Negative for mass or adenopathy Lungs/Pleura: Negative for infiltrate or effusion.  Lungs are clear Upper Abdomen: Hepatosplenomegaly negative for mass. Musculoskeletal: Median sternotomy. Thoracic degenerative change without acute skeletal abnormality. Review of the MIP images confirms the above findings. IMPRESSION: 1. Negative for pulmonary embolism 2. Thoracic aortic aneurysm. Ascending aorta 4.4 cm. Recommend annual imaging followup by CTA or MRA. This recommendation follows 2010 ACCF/AHA/AATS/ACR/ASA/SCA/SCAI/SIR/STS/SVM Guidelines for the Diagnosis and Management of Patients with Thoracic Aortic Disease. Circulation. 2010; 121: [id number]. Advair right subclavian artery 4. Hepatosplenomegaly Aortic Atherosclerosis (ICD10-I70.0). Electronically Signed   By: CFranchot GalloM.D.   On: 06/26/2017 11:48    EKG:    Sinus rhythm 82 bpm, left anterior fascicular block  IMPRESSION AND PLAN:   1.  Acute hypoxic respiratory failure with pulse ox of 87% on room air.  Oxygen supplementation ordered. 2.  COPD exacerbation.  Solu-Medrol given in the emergency room.  Nebulizer treatments ordered.  I will continue Solu-Medrol 40 mg daily DuoNeb and budesonide nebulizers. 3.  Fever.  CT scan not showing a pneumonia.  Will send off urine analysis and urine culture.  Patient does have a otitis media so I will continue the Rocephin and Zithromax.  With her shaking chill on concerned about infection the blood so I did order a blood culture set. 4.  Relative hypotension.  IV fluid bolus given and I will continue IV fluids. 5.  Cir[place]osis of liver [place]  Send off a ammonia level.  Thrombocytopenia is chronic secondary to cirrhosis. 6.  Obstructive sleep apnea.  She will call family to try to bring in her CPAP.  If she does not bring it and we will Get one ordered here. 7.  Hypothyroidism unspecified on levothyroxine 8.  Type 2 diabetes mellitus without complication send off a hemoglobin A1c put on sliding scale and hold Glucophage for right now 9.  Morbid obesity with BMI 49.26.  Weight loss needed.  Patient stated she did have a gastric sleeve set up as outpatient but they canceled it because of her cirrhosis 10.  History of aortic valve surgery and pacemaker  All the records are reviewed and case discussed with ED provider. Management plans discussed with the patient, and she is in agreement.  CODE STATUS: Full code  TOTAL TIME TAKING CARE OF THIS PATIENT: 50 minutes.    RLoletha GrayerM.D on 06/26/2017 at 1:34 PM  Between 7am to 6pm - Pager - [phone] After 6pm call admission pager [phone]  [facility]  [phone] CC: Primary care physician; KPerrin Maltese MD

## 2017-06-26 NOTE — ED Notes (Signed)
Addend note from this same time  Reported to me by EMS that pt wears o2 chronically at 2L  - documented this information  Pt verbalizes that she does not wear o2 - it was placed on her by family today to decrease her ShOB    93% RA sats upon arrival

## 2017-06-26 NOTE — ED Notes (Signed)
Assisted her to the BR  VSS

## 2017-06-26 NOTE — ED Provider Notes (Signed)
[facility] Emergency Department Provider Note  ____________________________________________  Time seen: Approximately 12:49 PM  I have reviewed the triage vital signs and the nursing notes.   HISTORY  Chief Complaint Shortness of Breath    HPI ROSI Theresa Leon is a 62 y.o. female with a history of COPD and diabetes who complains of chills and shortness of breath since this morning. Also has a productive cough. Shortness of breath. Anterior chest pain is described as sharp, continuous, waxing and waning without aggravating or alleviating factors. She feels more short of breath when she lays down flat it more comfortable when she sits upright.      Past Medical History:  Diagnosis Date  . Acute on chronic renal failure ([place])   . Anemia   . Asthma 1975  . COPD (chronic obstructive pulmonary disease) ([place])   . Diabetes ([place])   . Diabetes mellitus without complication ([name]) 3419  . Diastolic CHF, acute on chronic (HCC)   . H/O aortic valve replacement   . Heart disease   . Hypothyroid   . Morbid obesity ([place])   . 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 OSA (obstructive sleep apnea)      Patient Active Problem List   Diagnosis Date Noted  . Chest pain 03/05/2017  . V tach ([place]) 09/18/2015  . Iron deficiency anemia due to chronic blood loss 05/03/2015  . Gastritis due to nonsteroidal anti-inflammatory drug (NSAID) 03/05/2015  . History of esophagogastroduodenoscopy (EGD) 03/05/2015  . Type 2 diabetes mellitus ([name]) 03/01/2015  . GERD (gastroesophageal reflux disease) 03/01/2015  . HLD (hyperlipidemia) 03/01/2015  . COPD (chronic obstructive pulmonary disease) ([place]) 03/01/2015  . Chronic diastolic CHF (congestive heart failure) ([place]) 03/01/2015  . Hypothyroidism 03/01/2015  . OSA on CPAP 03/01/2015  . Acute posthemorrhagic anemia 03/01/2015  . Dysphagia   . CHF (congestive heart failure) ([place])   . Acute  respiratory failure with hypoxia ([place]) 01/26/2014  . Encounter for imaging study to confirm orogastric (OG) tube placement   . Respiratory failure ([place])   . 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 aortic valve relaced   2008  . BACK SURGERY     AS CHILD  . BREAST BIOPSY Left 04/24/2011   left breast bx neg done by dr byrnett  . BREAST SURGERY  September 25, 2011   intraductal papilloma of the left breast  . CHOLECYSTECTOMY  2007  . COLONOSCOPY WITH PROPOFOL N/A 08/04/2014   Procedure: COLONOSCOPY WITH PROPOFOL;  Surgeon: Manya Silvas, MD;  [facility] END[facility]COPY;  Service: Endoscopy;  Laterality: N/A;  . ESOPHAGOGASTRODUODENOSCOPY Left 03/03/2015   Procedure: ESOPHAGOGASTRODUODENOSCOPY (EGD);  Surgeon: Manya Silvas, MD;  [facility] ENDOSCOPY;  Service: Endoscopy;  Laterality: Left;  . ESOPHAGOGASTRODUODENOSCOPY (EGD) WITH PROPOFOL  08/04/2014   Procedure: ESOPHAGOGASTRODUODENOSCOPY (EGD) WITH PROPOFOL;  Surgeon: Manya Silvas, MD;  Location: [facility] ENDOSCOPY;  Service: Endoscopy;;  . ESOPHAGOGASTRODUODENOSCOPY (EGD) WITH PROPOFOL N/A 04/09/2016   Procedure: ESOPHAGOGASTRODUODENOSCOPY (EGD) WITH PROPOFOL;  Surgeon: Lucilla Lame, MD;  Location: [facility] ENDOSCOPY;  Service: Endoscopy;  Laterality: N/A;  . PHRENIC NERVE PACEMAKER IMPLANTATION  2009  . TRACHEOSTOMY TUBE PLACEMENT N/A 02/04/2014   Procedure: TRACHEOSTOMY;  Surgeon: Rozetta Nunnery, MD;  Location: [place];  Service: ENT;  Laterality: N/A;     Prior to Admission medications   Medication Sig Start Date End Date Taking? Authorizing  Provider  acetaminophen (TYLENOL) 500 MG tablet Take 1,000 mg by mouth every 6 (six) hours as needed for mild pain or headache.    [provider]  albuterol (PROVENTIL HFA;VENTOLIN HFA) 108 (90 BASE) MCG/ACT inhaler Inhale 2 puffs into the lungs every 6 (six) hours as needed for wheezing or shortness of breath.      [provider]  amoxicillin (AMOXIL) 500 MG capsule Take 1 capsule by mouth as needed for other. Takes prior to dental procedures 03/07/16   [provider]  atorvastatin (LIPITOR) 80 MG tablet Take 1 tablet (80 mg total) by mouth daily at 6 PM. 03/07/17   Epifanio Lesches, MD  Calcium Carbonate-Vitamin D (CALCIUM 600+D) 600-400 MG-UNIT tablet Take 1 tablet by mouth at bedtime.    [provider]  cholecalciferol (VITAMIN D) 1000 units tablet Take 1,000 Units by mouth daily.    [provider]  citalopram (CELEXA) 40 MG tablet Take 40 mg by mouth daily.    [provider]  EPINEPHrine (EPIPEN 2-PAK) 0.3 mg/0.3 mL IJ SOAJ injection Inject 0.3 mg into the muscle once as needed (for severe allergic reaction).    [provider]  fluticasone (FLONASE) 50 MCG/ACT nasal spray Place 2 sprays into both nostrils daily as needed for rhinitis.     [provider]  gabapentin (NEURONTIN) 600 MG tablet Take 600 mg by mouth 4 (four) times daily.     [provider]  levothyroxine (SYNTHROID, LEVOTHROID) 125 MCG tablet Take 100 mcg by mouth daily before breakfast.     [provider]  loratadine (CLARITIN) 10 MG tablet Take 10 mg by mouth daily.    [provider]  metFORMIN (GLUCOPHAGE-XR) 500 MG 24 hr tablet Take 500 mg by mouth 2 (two) times daily.     [provider]  montelukast (SINGULAIR) 10 MG tablet Take 10 mg by mouth at bedtime.    [provider]  Oxycodone HCl 10 MG TABS Take 10 mg by mouth 3 (three) times daily as needed for severe pain.     [provider]  sucralfate (CARAFATE) 1 g tablet Take 1 tablet (1 g total) by mouth 4 (four) times daily -  with meals and at bedtime. 03/05/15   Theodoro Grist, MD  traZODone (DESYREL) 50 MG tablet Take 50 mg by mouth at bedtime.     [provider]  Vitamin D, Ergocalciferol, (DRISDOL) 50000 units CAPS capsule Take 50,000 Units by mouth  every 7 (seven) days. Fridays    [provider]     Allergies Furosemide; Mold extract [trichophyton]; Tetanus toxoids; Qvar [beclomethasone]; Toprol xl [metoprolol tartrate]; Hctz [hydrochlorothiazide]; Nickel; Nitroglycerin; and Other   Family History  Problem Relation Age of Onset  . Skin telangiectasia Mother   . Lung cancer Maternal Grandmother   . Breast cancer Maternal Grandmother   . Lung cancer Paternal Grandmother     Social History Social History   Tobacco Use  . Smoking status: Former Research scientist (life sciences)  . Smokeless tobacco: Never Used  Substance Use Topics  . Alcohol use: No  . Drug use: No    Review of Systems  Constitutional:   no fever, positive chills ENT:   No sore throat. No rhinorrhea. Cardiovascular:   positive as above chest pain without syncope. Respiratory:   positive shortness of breath and productive cough. Gastrointestinal:   Negative for abdominal pain, vomiting and diarrhea.  Musculoskeletal:   Negative for focal pain or swelling All other systems reviewed  and are negative except as documented above in ROS and HPI.  ____________________________________________   PHYSICAL EXAM:  VITAL SIGNS: ED Triage Vitals  Enc Vitals Group     BP 06/26/17 0900 (!) 120/58     Pulse Rate 06/26/17 0900 86     Resp 06/26/17 0900 19     Temp 06/26/17 0910 (!) 101.5 F (38.6 C)     Temp Source 06/26/17 0910 Oral     SpO2 06/26/17 0900 97 %     Weight 06/26/17 0858 (!) 324 lb (147 kg)     Height 06/26/17 0858 5' 8"  (1.727 m)     Head Circumference --      Peak Flow --      Pain Score 06/26/17 0857 7     Pain Loc --      Pain Edu? --      Excl. in [place]? --     Vital signs reviewed, nursing assessments reviewed.   Constitutional:   Alert and oriented.mild respiratory distress. Eyes:   Conjunctivae are normal. EOMI. PERRL. ENT      Head:   Normocephalic and atraumatic.      Nose:   No congestion/rhinnorhea.       Mouth/Throat:   dry mucous  membranes, no pharyngeal erythema. No peritonsillar mass.       Neck:   No meningismus. Full ROM. Hematological/Lymphatic/Immunilogical:   No cervical lymphadenopathy. Cardiovascular:   RRR. Symmetric bilateral radial and DP pulses.  No murmurs.  Respiratory:   Normal respiratory effort without tachypnea/retractions. breath sounds demonstrate symmetric air entry with expiratory wheezing more pronounced on the right. Gastrointestinal:   Soft and nontender. Non distended. There is no CVA tenderness.  No rebound, rigidity, or guarding. no rash or inflammatory changes under her pannus. No evidence of hernias.  Musculoskeletal:   Normal range of motion in all extremities. No joint effusions.  No lower extremity tenderness.  chronic peripheral edema, symmetric. Neurologic:   Normal speech and language.  Motor grossly intact. No acute focal neurologic deficits are appreciated.  Skin:    Skin is warm, dry and intact. No rash noted.  No petechiae, purpura, or bullae.  ____________________________________________    LABS (pertinent positives/negatives) (all labs ordered are listed, but only abnormal results are displayed) Labs Reviewed  COMPREHENSIVE METABOLIC PANEL - Abnormal; Notable for the following components:      Result Value   Chloride 99 (*)    Glucose, Bld 213 (*)    Creatinine, Ser 1.27 (*)    Calcium 8.4 (*)    Total Bilirubin 1.9 (*)    GFR calc non Af Amer 45 (*)    GFR calc Af Amer 52 (*)    All other components within normal limits  LIPASE, BLOOD - Abnormal; Notable for the following components:   Lipase 59 (*)    All other components within normal limits  CBC WITH DIFFERENTIAL/PLATELET - Abnormal; Notable for the following components:   Platelets 94 (*)    Neutro Abs 7.5 (*)    All other components within normal limits   ____________________________________________   EKG  interpreted by me atrial paced Sinus rhythm rate of 82, left axis, normal intervals. Normal QRS  ST segments and T waves.  ____________________________________________    KHVFMBBUY  Dg Chest 2 View  Result Date: 06/26/2017 CLINICAL DATA:  Shortness of Breath EXAM: CHEST - 2 VIEW COMPARISON:  May 04, 2017 FINDINGS: There is no edema or consolidation. Heart size and pulmonary vascularity are  within normal limits. Pacemaker leads are attached to the right atrium and right ventricle. There is an aortic valve replacement. No adenopathy. There is degenerative change in the thoracic spine. IMPRESSION: No edema or consolidation. Heart size normal. Postoperative changes noted. Electronically Signed   By: Lowella Grip III M.D.   On: 06/26/2017 09:39   Ct Angio Chest Pe W And/or Wo Contrast  Result Date: 06/26/2017 CLINICAL DATA:  Suspect pulmonary embolism. EXAM: CT ANGIOGRAPHY CHEST WITH CONTRAST TECHNIQUE: Multidetector CT imaging of the chest was performed using the standard protocol during bolus administration of intravenous contrast. Multiplanar CT image reconstructions and MIPs were obtained to evaluate the vascular anatomy. CONTRAST:  77m ISOVUE-370 IOPAMIDOL (ISOVUE-370) INJECTION 76% COMPARISON:  Chest x-ray 06/26/2017.  Chest CT 09/24/2016 FINDINGS: Cardiovascular:  Negative for pulmonary embolism. Mild atherosclerotic disease in the aorta. Ascending aorta 4.4 cm. Descending thoracic aorta 3.0 cm. Negative for dissection. Aortic valve replacement. Pacemaker. Negative for pericardial effusion. Heart size within normal limits. 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 Mediastinum/Nodes: Negative for mass or adenopathy Lungs/Pleura: Negative for infiltrate or effusion.  Lungs are clear Upper Abdomen: Hepatosplenomegaly negative for mass. Musculoskeletal: Median sternotomy. Thoracic degenerative change without acute skeletal abnormality. Review of the MIP images confirms the above findings. IMPRESSION: 1. Negative for pulmonary embolism 2. Thoracic aortic  aneurysm. Ascending aorta 4.4 cm. Recommend annual imaging followup by CTA or MRA. This recommendation follows 2010 ACCF/AHA/AATS/ACR/ASA/SCA/SCAI/SIR/STS/SVM Guidelines for the Diagnosis and Management of Patients with Thoracic Aortic Disease. Circulation. 2010; 121: [id number]. Advair right subclavian artery 4. Hepatosplenomegaly Aortic Atherosclerosis (ICD10-I70.0). Electronically Signed   By: CFranchot GalloM.D.   On: 06/26/2017 11:48    ____________________________________________   PROCEDURES Procedures  ____________________________________________  DIFFERENTIAL DIAGNOSIS   pneumonia, COPD exacerbation, pneumothorax, pulmonary embolism. Doubt ACS or dissection  CLINICAL IMPRESSION / ASSESSMENT AND PLAN / ED COURSE  Pertinent labs & imaging results that were available during my care of the patient were reviewed by me and considered in my medical decision making (see chart for details).    patient presents with shortness of breath, fever to 101.5. Not septic on arrival with other vital signs unremarkable. Suspect COPD exacerbation, possible pneumonia. Check chest x-ray and labs. Bronchodilators.  Clinical Course as of Jun 27 1302  Thu Jun 26, 2017  1050 workup negative. At this point patient will need a CT scan of the chest to evaluate for PE..   [PS]    Clinical Course User Index [PS] SCarrie Mew MD     ----------------------------------------- 12:58 PM on 06/26/2017 -----------------------------------------  On reassessment patient still has shortness of breath, now room air sat is 87%. consistent with COPD exacerbation given that CT angiogram is negative. continue supplemental oxygen, start IV ceftriaxone and azithromycin for mortality benefit despite the absence of any demonstrable infiltrate on CT scan. Case discussed with hospitalist for further management.  ____________________________________________   FINAL CLINICAL IMPRESSION(S) / ED  DIAGNOSES    Final diagnoses:  COPD with acute exacerbation ([place]  Hypoxia     ED Discharge Orders    None      Portions of this note were generated with dragon dictation software. Dictation errors may occur despite best attempts at proofreading.    SCarrie Mew MD 06/26/17 1304

## 2017-06-26 NOTE — ED Triage Notes (Signed)
She arrives today via [facility] from home with reports of increased shortness of breath since awakening today  Pt stating that she feels shaky and has been having chills  Denies acute pain HX DM, pacemaker

## 2017-06-27 LAB — RESPIRATORY PANEL BY PCR
Adenovirus: NOT DETECTED
BORDETELLA PERTUSSIS-RVPCR: NOT DETECTED
CHLAMYDOPHILA PNEUMONIAE-RVPPCR: NOT DETECTED
CORONAVIRUS 229E-RVPPCR: NOT DETECTED
Coronavirus HKU1: NOT DETECTED
Coronavirus NL63: NOT DETECTED
Coronavirus OC43: NOT DETECTED
INFLUENZA B-RVPPCR: NOT DETECTED
Influenza A: NOT DETECTED
METAPNEUMOVIRUS-RVPPCR: NOT DETECTED
MYCOPLASMA PNEUMONIAE-RVPPCR: NOT DETECTED
PARAINFLUENZA VIRUS 2-RVPPCR: NOT DETECTED
Parainfluenza Virus 1: NOT DETECTED
Parainfluenza Virus 3: NOT DETECTED
Parainfluenza Virus 4: NOT DETECTED
RESPIRATORY SYNCYTIAL VIRUS-RVPPCR: NOT DETECTED
Rhinovirus / Enterovirus: NOT DETECTED

## 2017-06-27 LAB — BLOOD CULTURE ID PANEL (REFLEXED)
ACINETOBACTER BAUMANNII: NOT DETECTED
CANDIDA ALBICANS: NOT DETECTED
CANDIDA GLABRATA: NOT DETECTED
CANDIDA PARAPSILOSIS: NOT DETECTED
CANDIDA TROPICALIS: NOT DETECTED
Candida krusei: NOT DETECTED
ENTEROBACTER CLOACAE COMPLEX: NOT DETECTED
ENTEROBACTERIACEAE SPECIES: NOT DETECTED
Enterococcus species: NOT DETECTED
Escherichia coli: NOT DETECTED
Haemophilus influenzae: NOT DETECTED
KLEBSIELLA OXYTOCA: NOT DETECTED
KLEBSIELLA PNEUMONIAE: NOT DETECTED
LISTERIA MONOCYTOGENES: NOT DETECTED
Neisseria meningitidis: NOT DETECTED
Proteus species: NOT DETECTED
Pseudomonas aeruginosa: NOT DETECTED
STREPTOCOCCUS PYOGENES: NOT DETECTED
Serratia marcescens: NOT DETECTED
Staphylococcus aureus (BCID): NOT DETECTED
Staphylococcus species: NOT DETECTED
Streptococcus agalactiae: NOT DETECTED
Streptococcus pneumoniae: NOT DETECTED
Streptococcus species: DETECTED — AB

## 2017-06-27 LAB — TROPONIN I: Troponin I: <0.03 ng/mL (ref <0.03)

## 2017-06-27 LAB — BASIC METABOLIC PANEL
ANION GAP: 10 (ref 5–15)
BUN: 27 mg/dL — ABNORMAL HIGH (ref 6–20)
CO2: 25 mmol/L (ref 22–32)
Calcium: 8.4 mg/dL — ABNORMAL LOW (ref 8.9–10.3)
Chloride: 99 mmol/L — ABNORMAL LOW (ref 101–111)
Creatinine, Ser: 1.18 mg/dL — ABNORMAL HIGH (ref 0.44–1.00)
GFR, EST AFRICAN AMERICAN: 57 mL/min — AB (ref >60)
GFR, EST NON AFRICAN AMERICAN: 49 mL/min — AB (ref >60)
GLUCOSE: 357 mg/dL — AB (ref 65–99)
POTASSIUM: 4.1 mmol/L (ref 3.5–5.1)
Sodium: 134 mmol/L — ABNORMAL LOW (ref 135–145)

## 2017-06-27 LAB — CBC
HEMATOCRIT: 37.7 % (ref 35.0–47.0)
HEMOGLOBIN: 13.1 g/dL (ref 12.0–16.0)
MCH: 31.8 pg (ref 26.0–34.0)
MCHC: 34.7 g/dL (ref 32.0–36.0)
MCV: 91.7 fL (ref 80.0–100.0)
Platelets: 73 10*3/uL — ABNORMAL LOW (ref 150–440)
RBC: 4.11 MIL/uL (ref 3.80–5.20)
RDW: 13.6 % (ref 11.5–14.5)
WBC: 8.7 10*3/uL (ref 3.6–11.0)

## 2017-06-27 LAB — GLUCOSE, CAPILLARY
GLUCOSE-CAPILLARY: 309 mg/dL — AB (ref 65–99)
GLUCOSE-CAPILLARY: 283 mg/dL — AB (ref 65–99)
Glucose-Capillary: 290 mg/dL — ABNORMAL HIGH (ref 65–99)
Glucose-Capillary: 260 mg/dL — ABNORMAL HIGH (ref 65–99)
Glucose-Capillary: 315 mg/dL — ABNORMAL HIGH (ref 65–99)

## 2017-06-27 MED ORDER — MUPIROCIN CALCIUM 2 % EX CREA
TOPICAL_CREAM | Freq: Two times a day (BID) | CUTANEOUS | Status: DC
  Administered 2017-06-27 – 2017-06-29 (×5): via TOPICAL
  Filled 2017-06-27: qty 15

## 2017-06-27 MED ORDER — SODIUM CHLORIDE 0.9 % IV SOLN
2.0000 g | INTRAVENOUS | Status: DC
  Administered 2017-06-27 – 2017-06-28 (×2): 2 g via INTRAVENOUS
  Filled 2017-06-27 (×2): qty 2
  Filled 2017-06-27: qty 20

## 2017-06-27 NOTE — Progress Notes (Addendum)
[place] [facility] [place] NAME: Theresa Leon    MR#:  [id number]  DATE OF BIRTH:  1955-05-05  SUBJECTIVE:  Patient seen and evaluated this morning Complaints of chest pain The pain aggravates upon palpation Comfortable on oxygen via nasal cannula No fever or chills  REVIEW OF SYSTEMS:    ROS  CONSTITUTIONAL: No documented fever. No fatigue, weakness. No weight gain, no weight loss.  EYES: No blurry or double vision.  ENT: No tinnitus. No postnasal drip. No redness of the oropharynx.  RESPIRATORY: No cough, no wheeze, no hemoptysis. No dyspnea.  CARDIOVASCULAR: Has left sided chest pain. No orthopnea. No palpitations. No syncope.  GASTROINTESTINAL: No nausea, no vomiting or diarrhea. No abdominal pain. No melena or hematochezia.  GENITOURINARY: No dysuria or hematuria.  ENDOCRINE: No polyuria or nocturia. No heat or cold intolerance.  HEMATOLOGY: No anemia. No bruising. No bleeding.  INTEGUMENTARY: No rashes. No lesions.  MUSCULOSKELETAL: No arthritis. No swelling. No gout.  NEUROLOGIC: No numbness, tingling, or ataxia. No seizure-type activity.  PSYCHIATRIC: No anxiety. No insomnia. No ADD.   DRUG ALLERGIES:   Allergies  Allergen Reactions  . Furosemide Other (See Comments)    Pt states that this medication caused renal failure.    . Mold Extract [Trichophyton] Anaphylaxis  . Tetanus Toxoids Other (See Comments)    Pt states that her arm turns black.    Colin Crigler [Beclomethasone] Other (See Comments)    Reaction:  Thrush   . Toprol Xl [Metoprolol Tartrate] Hives  . Hctz [Hydrochlorothiazide] Other (See Comments)    Reports renal failure   . Nickel   . Nitroglycerin Other (See Comments)    Pt states that this medication makes her BP bottom out.    . Other Hives, Swelling and Other (See Comments)    Pt states that she is allergic to steroids.      VITALS:  Blood pressure 129/66, pulse 79, temperature 97.9 F (36.6 C), temperature  source Oral, resp. rate 20, height 5' 8"  (1.727 m), weight (!) 147 kg (324 lb), SpO2 97 %.  PHYSICAL EXAMINATION:   Physical Exam  GENERAL:  62 y.o.-year-old obese patient lying in the bed with no acute distress.  EYES: Pupils equal, round, reactive to light and accommodation. No scleral icterus. Extraocular muscles intact.  HEENT: Head atraumatic, normocephalic. Oropharynx and nasopharynx clear.  NECK:  Supple, no jugular venous distention. No thyroid enlargement, no tenderness.  LUNGS: Normal breath sounds bilaterally, no wheezing, rales, rhonchi. No use of accessory muscles of respiration.  CARDIOVASCULAR: S1, S2 normal. No murmurs, rubs, or gallops.  Tenderness in the left side of chest worsens on palpation ABDOMEN: Soft, nontender,obese,  nondistended. Bowel sounds present. No organomegaly or mass.  EXTREMITIES: No cyanosis, clubbing or edema b/l.    NEUROLOGIC: Cranial nerves II through XII are intact. No focal Motor or sensory deficits b/l.   PSYCHIATRIC: The patient is alert and oriented x 3.  SKIN: No obvious rash, lesion, or ulcer.   LABORATORY PANEL:   CBC Recent Labs  Lab 06/27/17 0556  WBC 8.7  HGB 13.1  HCT 37.7  PLT 73*   ------------------------------------------------------------------------------------------------------------------ Chemistries  Recent Labs  Lab 06/26/17 0859 06/27/17 0556  NA 138 134*  K 4.0 4.1  CL 99* 99*  CO2 30 25  GLUCOSE 213* 357*  BUN 19 27*  CREATININE 1.27* 1.18*  CALCIUM 8.4* 8.4*  AST 20  --   ALT 14  --  ALKPHOS 72  --   BILITOT 1.9*  --    ------------------------------------------------------------------------------------------------------------------  Cardiac Enzymes No results for input(s): TROPONINI in the last 168 hours. ------------------------------------------------------------------------------------------------------------------  RADIOLOGY:  Dg Chest 2 View  Result Date: 06/26/2017 CLINICAL DATA:   Shortness of Breath EXAM: CHEST - 2 VIEW COMPARISON:  May 04, 2017 FINDINGS: There is no edema or consolidation. Heart size and pulmonary vascularity are within normal limits. Pacemaker leads are attached to the right atrium and right ventricle. There is an aortic valve replacement. No adenopathy. There is degenerative change in the thoracic spine. IMPRESSION: No edema or consolidation. Heart size normal. Postoperative changes noted. Electronically Signed   By: Lowella Grip III M.D.   On: 06/26/2017 09:39   Ct Angio Chest Pe W And/or Wo Contrast  Result Date: 06/26/2017 CLINICAL DATA:  Suspect pulmonary embolism. EXAM: CT ANGIOGRAPHY CHEST WITH CONTRAST TECHNIQUE: Multidetector CT imaging of the chest was performed using the standard protocol during bolus administration of intravenous contrast. Multiplanar CT image reconstructions and MIPs were obtained to evaluate the vascular anatomy. CONTRAST:  17m ISOVUE-370 IOPAMIDOL (ISOVUE-370) INJECTION 76% COMPARISON:  Chest x-ray 06/26/2017.  Chest CT 09/24/2016 FINDINGS: Cardiovascular:  Negative for pulmonary embolism. Mild atherosclerotic disease in the aorta. Ascending aorta 4.4 cm. Descending thoracic aorta 3.0 cm. Negative for dissection. Aortic valve replacement. Pacemaker. Negative for pericardial effusion. Heart size within normal limits. 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 Mediastinum/Nodes: Negative for mass or adenopathy Lungs/Pleura: Negative for infiltrate or effusion.  Lungs are clear Upper Abdomen: Hepatosplenomegaly negative for mass. Musculoskeletal: Median sternotomy. Thoracic degenerative change without acute skeletal abnormality. Review of the MIP images confirms the above findings. IMPRESSION: 1. Negative for pulmonary embolism 2. Thoracic aortic aneurysm. Ascending aorta 4.4 cm. Recommend annual imaging followup by CTA or MRA. This recommendation follows 2010  ACCF/AHA/AATS/ACR/ASA/SCA/SCAI/SIR/STS/SVM Guidelines for the Diagnosis and Management of Patients with Thoracic Aortic Disease. Circulation. 2010; 121: [id number]. Advair right subclavian artery 4. Hepatosplenomegaly Aortic Atherosclerosis (ICD10-I70.0). Electronically Signed   By: CFranchot GalloM.D.   On: 06/26/2017 11:48     ASSESSMENT AND PLAN:   62year old female patient with history of COPD, cirrhosis of liver, [place], sleep apnea, diabetes mellitus type 2, obesity, hypothyroidism currently under hospitalist service for dyspnea and hypoxia  -Hypoxia Present mainly on ambulation At rest patient is able to wean off oxygen Has obesity hypoventilation Incentive spirometry  -Obstructive sleep apnea CPAP at bedtime  -Musculoskeletal chest pain EKG shows paced rhythm No ST segment abnormalities Cycle troponin  -COPD exacerbation IV Solu-Medrol and nebulization treatments  - Streptococcal Bacteremia Currently on IV Rocephin ABX F/U complete sensitivity reports  -Hypotension resolved  -[facility]  -Morbid obesity Lifestyle modification recommended  All the records are reviewed and case discussed with Care Management/Social Worker. Management plans discussed with the patient, family and they are in agreement.  CODE STATUS: Full code  DVT Prophylaxis: SCDs  TOTAL TIME TAKING CARE OF THIS PATIENT: 35 minutes.   POSSIBLE D/C IN 1 to 2 DAYS, DEPENDING ON CLINICAL CONDITION.  PSaundra ShellingM.D on 06/27/2017 at 2:33 PM  Between 7am to 6pm - Pager - [phone]  After 6pm go to [email or web address] - password [email or web address]  Office  [phone] CC: Primary care physician; KPerrin Maltese MD  Note: This dictation was prepared with Dragon dictation along with smaller phrase technology. Any transcriptional errors that result from this process are unintentional.

## 2017-06-27 NOTE — Progress Notes (Signed)
PHARMACY - PHYSICIAN COMMUNICATION CRITICAL VALUE ALERT - BLOOD CULTURE IDENTIFICATION (BCID)  Results for orders placed or performed during the hospital encounter of 06/26/17  Blood Culture ID Panel (Reflexed) (Collected: 06/26/2017  2:12 PM)  Result Value Ref Range   Enterococcus species NOT DETECTED NOT DETECTED   Listeria monocytogenes NOT DETECTED NOT DETECTED   Staphylococcus species NOT DETECTED NOT DETECTED   Staphylococcus aureus NOT DETECTED NOT DETECTED   Streptococcus species DETECTED (A) NOT DETECTED   Streptococcus agalactiae NOT DETECTED NOT DETECTED   Streptococcus pneumoniae NOT DETECTED NOT DETECTED   Streptococcus pyogenes NOT DETECTED NOT DETECTED   Acinetobacter baumannii NOT DETECTED NOT DETECTED   Enterobacteriaceae species NOT DETECTED NOT DETECTED   Enterobacter cloacae complex NOT DETECTED NOT DETECTED   Escherichia coli NOT DETECTED NOT DETECTED   Klebsiella oxytoca NOT DETECTED NOT DETECTED   Klebsiella pneumoniae NOT DETECTED NOT DETECTED   Proteus species NOT DETECTED NOT DETECTED   Serratia marcescens NOT DETECTED NOT DETECTED   Haemophilus influenzae NOT DETECTED NOT DETECTED   Neisseria meningitidis NOT DETECTED NOT DETECTED   Pseudomonas aeruginosa NOT DETECTED NOT DETECTED   Candida albicans NOT DETECTED NOT DETECTED   Candida glabrata NOT DETECTED NOT DETECTED   Candida krusei NOT DETECTED NOT DETECTED   Candida parapsilosis NOT DETECTED NOT DETECTED   Candida tropicalis NOT DETECTED NOT DETECTED    Name of physician (or Provider) Contacted: [place]   Changes to prescribed antibiotics required:   Yes, will d/c azithromycin and increase ceftriaxone to 2 gm IV Q24H.   Glorian Mcdonell D 06/27/2017  2:16 AM

## 2017-06-27 NOTE — Progress Notes (Signed)
Inpatient Diabetes Program Recommendations  AACE/ADA: New Consensus Statement on Inpatient Glycemic Control (2015)  Target Ranges:  Prepandial:   less than 140 mg/dL      Peak postprandial:   less than 180 mg/dL (1-2 hours)      Critically ill patients:  140 - 180 mg/dL   Results for ANALICIA, SKIBINSKI (MRN [id number]) as of 06/27/2017 14:34  Ref. Range 06/26/2017 16:53 06/26/2017 21:08 06/27/2017 07:46 06/27/2017 09:18 06/27/2017 12:04  Glucose-Capillary Latest Ref Range: 65 - 99 mg/dL 252 (H)  5 units NOVOLOG  387 (H)  2 units NOVOLOG  290 (H)  5 units NOVOLOG  260 (H) 315 (H)  7 units NOVOLOG   Results for [place], GAMERO (MRN [id number]) as of 06/27/2017 14:34  Ref. Range 06/26/2017 08:59  Hemoglobin A1C Latest Ref Range: 4.8 - 5.6 % 6.2 (H)    Admit SOB/ COPD Flare/ Fever  History: DM, COPD, CHF  Home DM Meds: Metformin 500 mg BID  Current Orders: Novolog Sensitive Correction Scale/ SSI (0-9 units) TID AC + HS       Pt got 125 mg Solumedrol X 1 dose yest at 2pm and now getting Solumedrol 40 mg daily.  Novolog SSI started yest at 5pm.   Current A1c= 6.2% shows good glucose control prior to admission.    Steroids likely causing such high glucose levels.    MD- If patient continues to have such severe glucose elevations while getting IV steroids, please consider the following:  1. Start Lantus 15 units QHS (0.1 units/kg dosing based on weight of 147 kg)  2. Increase Novolog SSI to Resistant scale (0-20 units) TID AC + HS     --Will follow patient during hospitalization--  Wyn Quaker RN, MSN, CDE Diabetes Coordinator Inpatient Glycemic Control Team Team Pager: [phone] (8a-5p)

## 2017-06-28 ENCOUNTER — Inpatient Hospital Stay (HOSPITAL_COMMUNITY)
Admit: 2017-06-28 | Discharge: 2017-06-28 | Disposition: A | Discharge status: Home / Self Care | Payer: Medicare HMO | Attending: Internal Medicine | Admitting: Internal Medicine

## 2017-06-28 DIAGNOSIS — I350 Nonrheumatic aortic (valve) stenosis: Secondary | ICD-10-CM

## 2017-06-28 LAB — GLUCOSE, CAPILLARY
GLUCOSE-CAPILLARY: 270 mg/dL — AB (ref 65–99)
Glucose-Capillary: 161 mg/dL — ABNORMAL HIGH (ref 65–99)
Glucose-Capillary: 251 mg/dL — ABNORMAL HIGH (ref 65–99)
Glucose-Capillary: 303 mg/dL — ABNORMAL HIGH (ref 65–99)

## 2017-06-28 LAB — URINE CULTURE: SPECIAL REQUESTS: NORMAL

## 2017-06-28 MED ORDER — IPRATROPIUM-ALBUTEROL 0.5-2.5 (3) MG/3ML IN SOLN: 3.0000 mL | Freq: Two times a day (BID) | RESPIRATORY_TRACT | Status: DC

## 2017-06-28 MED ORDER — IPRATROPIUM-ALBUTEROL 0.5-2.5 (3) MG/3ML IN SOLN
3.0000 mL | Freq: Three times a day (TID) | RESPIRATORY_TRACT | Status: DC
  Administered 2017-06-28 – 2017-06-29 (×2): 3 mL via RESPIRATORY_TRACT
  Filled 2017-06-28 (×2): qty 3

## 2017-06-28 NOTE — Progress Notes (Signed)
[facility] NAME: Theresa Leon    MR#:  [id number]  DATE OF BIRTH:  Nov 22, 1955  SUBJECTIVE:  CHIEF COMPLAINT:   Chief Complaint  Patient presents with  . Shortness of Breath   -Breathing is slightly improved.  Still requiring 1 L oxygen. -Blood cultures are positive  REVIEW OF SYSTEMS:  Review of Systems  Constitutional: Positive for malaise/fatigue. Negative for chills and fever.  HENT: Negative for congestion, ear discharge, hearing loss and nosebleeds.   Eyes: Negative for blurred vision and double vision.  Respiratory: Positive for shortness of breath. Negative for cough and wheezing.   Cardiovascular: Negative for chest pain and palpitations.  Gastrointestinal: Negative for abdominal pain, constipation, diarrhea, nausea and vomiting.  Genitourinary: Negative for dysuria.  Neurological: Negative for dizziness, seizures and headaches.    DRUG ALLERGIES:   Allergies  Allergen Reactions  . Furosemide Other (See Comments)    Pt states that this medication caused renal failure.    . Mold Extract [Trichophyton] Anaphylaxis  . Tetanus Toxoids Other (See Comments)    Pt states that her arm turns black.    Kropp Crigler [Beclomethasone] Other (See Comments)    Reaction:  Thrush   . Toprol Xl [Metoprolol Tartrate] Hives  . Hctz [Hydrochlorothiazide] Other (See Comments)    Reports renal failure   . Nickel   . Nitroglycerin Other (See Comments)    Pt states that this medication makes her BP bottom out.    . Other Hives, Swelling and Other (See Comments)    Pt states that she is allergic to steroids.      VITALS:  Blood pressure 139/80, pulse 71, temperature 98.7 F (37.1 C), temperature source Oral, resp. rate 18, height 5' 8"  (1.727 m), weight (!) 147 kg (324 lb), SpO2 100 %.  PHYSICAL EXAMINATION:  Physical Exam  GENERAL:  62-year-old obese patient lying in the bed with no acute distress.  EYES: Pupils equal,  round, reactive to light and accommodation. No scleral icterus. Extraocular muscles intact.  HEENT: Head atraumatic, normocephalic. Oropharynx and nasopharynx clear.  NECK:  Supple, no jugular venous distention. No thyroid enlargement, no tenderness.  LUNGS: Normal breath sounds bilaterally, no wheezing, rales,rhonchi or crepitation. No use of accessory muscles of respiration.  Decreased bibasilar breath sounds CARDIOVASCULAR: S1, S2 normal. No rubs, or gallops. 3/6 systolic murmur present ABDOMEN: Soft, obese, nontender, nondistended. Bowel sounds present. No organomegaly or mass.  EXTREMITIES: No pedal edema, cyanosis, or clubbing.  NEUROLOGIC: Cranial nerves II through XII are intact. Muscle strength 5/5 in all extremities. Sensation intact. Gait not checked.  PSYCHIATRIC: The patient is alert and oriented x 3.  SKIN: No obvious rash, lesion, or ulcer.    LABORATORY PANEL:   CBC Recent Labs  Lab 06/27/17 0556  WBC 8.7  HGB 13.1  HCT 37.7  PLT 73*   ------------------------------------------------------------------------------------------------------------------  Chemistries  Recent Labs  Lab 06/26/17 0859 06/27/17 0556  NA 138 134*  K 4.0 4.1  CL 99* 99*  CO2 30 25  GLUCOSE 213* 357*  BUN 19 27*  CREATININE 1.27* 1.18*  CALCIUM 8.4* 8.4*  AST 20  --   ALT 14  --   ALKPHOS 72  --   BILITOT 1.9*  --    ------------------------------------------------------------------------------------------------------------------  Cardiac Enzymes Recent Labs  Lab 06/27/17 2128  TROPONINI <0.03   ------------------------------------------------------------------------------------------------------------------  RADIOLOGY:  No results found.  EKG:   Orders placed or performed during the  hospital encounter of 06/26/17  . ED EKG  . ED EKG  . EKG 12-Lead  . EKG 12-Lead  . ED EKG  . ED EKG  . EKG 12-Lead  . EKG 12-Lead  . EKG 12-Lead  . EKG 12-Lead  . EKG     ASSESSMENT AND PLAN:   62 year old female patient with history of COPD, cirrhosis of liver, Nash, sleep apnea, diabetes mellitus type 2, obesity, hypothyroidism currently under hospitalist service for dyspnea and hypoxia  1. Hypoxia Present mainly on ambulation At rest patient is able to wean off oxygen Has obesity hypoventilation Incentive spirometry  2. Obstructive sleep apnea CPAP at bedtime  3. COPD exacerbation IV Solu-Medrol and nebulization treatments -Wean steroids  4. Streptococcal Bacteremia Currently on IV Rocephin ABX -Get echocardiogram and repeat blood cultures.  Especially with her history of bovine aortic valve replacement  5. Hypotension resolved  6. 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  7. Morbid obesity Lifestyle modification recommended  8.  DVT prophylaxis-on Lovenox      All the records are reviewed and case discussed with Care Management/Social Workerr. Management plans discussed with the patient, family and they are in agreement.  CODE STATUS: Full Code  TOTAL TIME TAKING CARE OF THIS PATIENT: 39 minutes.   POSSIBLE D/C IN 1-2 DAYS, DEPENDING ON CLINICAL CONDITION.   Gladstone Lighter M.D on 06/28/2017 at 1:24 PM  Between 7am to 6pm - Pager - [phone]  After 6pm go to [email or web address] - password [email or web address] [facility]  Office  [phone]  CC: Primary care physician; Perrin Maltese, MD

## 2017-06-29 LAB — BASIC METABOLIC PANEL
ANION GAP: 6 (ref 5–15)
BUN: 30 mg/dL — AB (ref 6–20)
CHLORIDE: 100 mmol/L — AB (ref 101–111)
CO2: 33 mmol/L — ABNORMAL HIGH (ref 22–32)
Calcium: 9.8 mg/dL (ref 8.9–10.3)
Creatinine, Ser: 0.99 mg/dL (ref 0.44–1.00)
Glucose, Bld: 190 mg/dL — ABNORMAL HIGH (ref 65–99)
POTASSIUM: 4.4 mmol/L (ref 3.5–5.1)
SODIUM: 139 mmol/L (ref 135–145)

## 2017-06-29 LAB — CULTURE, BLOOD (ROUTINE X 2)
SPECIAL REQUESTS: ADEQUATE
Special Requests: ADEQUATE

## 2017-06-29 LAB — GLUCOSE, CAPILLARY
GLUCOSE-CAPILLARY: 162 mg/dL — AB (ref 65–99)
GLUCOSE-CAPILLARY: 243 mg/dL — AB (ref 65–99)

## 2017-06-29 MED ORDER — IPRATROPIUM-ALBUTEROL 0.5-2.5 (3) MG/3ML IN SOLN: 3.0000 mL | Freq: Three times a day (TID) | RESPIRATORY_TRACT | 0 refills | Status: DC

## 2017-06-29 MED ORDER — IPRATROPIUM-ALBUTEROL 0.5-2.5 (3) MG/3ML IN SOLN: 3.0000 mL | Freq: Two times a day (BID) | RESPIRATORY_TRACT | Status: DC

## 2017-06-29 MED ORDER — ATORVASTATIN CALCIUM 10 MG PO TABS: 10.0000 mg | ORAL_TABLET | Freq: Every day | ORAL | 1 refills | Status: DC

## 2017-06-29 MED ORDER — PREDNISONE 10 MG (21) PO TBPK: ORAL_TABLET | ORAL | 0 refills | Status: DC

## 2017-06-29 MED ORDER — SODIUM CHLORIDE 0.9% FLUSH: 3.0000 mL | INTRAVENOUS | Status: DC | PRN

## 2017-06-29 MED ORDER — AMOXICILLIN-POT CLAVULANATE 875-125 MG PO TABS: 1.0000 | ORAL_TABLET | Freq: Two times a day (BID) | ORAL | 0 refills | Status: AC

## 2017-06-29 MED ORDER — SODIUM CHLORIDE 0.9% FLUSH
3.0000 mL | Freq: Two times a day (BID) | INTRAVENOUS | Status: DC
  Administered 2017-06-29: 09:00:00 3 mL via INTRAVENOUS

## 2017-06-29 NOTE — Progress Notes (Addendum)
Pt reports she feels well and ready for discharge home to self care. Left Great toe care done with education done on care/signs with symptoms to report, foot care. Oral and written AVS instructions given with stated understanding.  Written rx for prednisone given to pt.

## 2017-06-29 NOTE — Discharge Instructions (Signed)
Acute Respiratory Distress Syndrome, Adult °Acute respiratory distress syndrome is a life-threatening condition in which fluid collects in the lungs. This prevents the lungs from filling with air and passing oxygen into the blood. This can cause the lungs and other vital organs to fail. The condition usually develops following an infection, illness, surgery, or injury. °What are the causes? °This condition may be caused by: °· An infection, such as sepsis or pneumonia. °· A serious injury to the head or chest. °· Severe bleeding from an injury. °· A major surgery. °· Breathing in harmful chemicals or smoke. °· Blood transfusions. °· A blood clot in the lungs. °· Breathing in vomit (aspiration). °· Near-drowning. °· Inflammation of the pancreas (pancreatitis). °· A drug overdose. °What are the signs or symptoms? °Sudden shortness of breath and rapid breathing are the main symptoms of this condition. Other symptoms may include: °· A fast or irregular heartbeat. °· Skin, lips, or fingernails that look blue (cyanosis). °· Confusion. °· Tiredness or loss of energy. °· Chest pain, particularly while taking a breath. °· Coughing. °· Restlessness or anxiety. °· Fever. This is usually present if there is an underlying infection, such as pneumonia. °How is this diagnosed? °This condition is diagnosed based on: °· Your symptoms. °· Medical history. °· A physical exam. During the exam, your health care provider will listen to your heart and check for crackling or wheezing sounds in your lungs. °You may also have other tests to confirm the diagnosis and measure how well your lungs are working. These may include: °· Measuring the amount of oxygen in your blood. Your health care provider will use two methods to do this procedure: °¨ A small device (pulse oximeter) that is placed on your finger, earlobe, or toe. °¨ An arterial blood gas test. A sample of blood is taken from an artery and tested for oxygen levels. °· Blood  tests. °· Chest X-rays or CT scans to look for fluid in the lungs. °· Taking a sample of your sputum to test for infection. °· Heart test, such as an echocardiogram or electrocardiogram. This is done to rule out any heart problems (such as heart failure) that may be causing your symptoms. °· Bronchoscopy. During this test, a thin, flexible tube with a light is passed into the mouth or nose, down the windpipe, and into the lungs. °How is this treated? °Treatment depends on the cause of your condition. The goal is to support you while your lungs heal and the underlying cause is treated. Treatment may include: °· Oxygen therapy. This may be done through: °¨ A tube in your nose or a face mask. °¨ A ventilator. This device helps move air into and out of your lungs through a breathing tube that is inserted into your mouth or nose. °· Continuous positive airway pressure (CPAP). This treatment uses mild air pressure to keep the airways open. A mask or other device will be placed over your nose or mouth. °· Tracheostomy. During this procedure, a small cut is made in your neck to create an opening to your windpipe. A breathing tube is placed directly into your windpipe. The breathing tube is connected to a ventilator. This is done if you have problems with your airway or if you need a ventilator for a long period of time. °· Positioning you to lie on your stomach (prone position). °· Medicines, such as: °¨ Sedatives to help you relax. °¨ Blood pressure medicines. °¨ Antibiotics to treat infection. °¨ Blood thinners   to prevent blood clots. °¨ Diuretics to help prevent excess fluid. °· Fluids and nutrients given through an IV tube. °· Wearing compression stockings on your legs to prevent blood clots. °· Extra corporeal membrane oxygenation (ECMO). This treatment takes blood outside your body, adds oxygen, and removes carbon dioxide. The blood is then returned to your body. This treatment is only used in severe cases. °Follow  these instructions at home: °· Take over-the-counter and prescription medicines only as told by your health care provider. °· Do not use any products that contain nicotine or tobacco, such as cigarettes and e-cigarettes. If you need help quitting, ask your health care provider. °· Limit alcohol intake to no more than 1 drink per day for nonpregnant women and 2 drinks per day for men. One drink equals 12 oz of beer, 5 oz of wine, or 1½ oz of hard liquor. °· Ask friends and family to help you if daily activities make you tired. °· Attend any pulmonary rehabilitation as told by your health care provider. This may include: °¨ Education about your condition. °¨ Exercises. °¨ Breathing training. °¨ Counseling. °¨ Learning techniques to conserve energy. °¨ Nutrition counseling. °· Keep all follow-up visits as told by your health care provider. This is important. °Contact a health care provider if: °· You become short of breath during activity or while resting. °· You develop a cough that does not go away. °· You have a fever. °· Your symptoms do not get better or they get worse. °· You become anxious or depressed. °Get help right away if: °· You have sudden shortness of breath. °· You develop sudden chest pain that does not go away. °· You develop a rapid heart rate. °· You develop swelling or pain in one of your legs. °· You cough up blood. °· You have trouble breathing. °· Your skin, lips, or fingernails turn blue. °These symptoms may represent a serious problem that is an emergency. Do not wait to see if the symptoms will go away. Get medical help right away. Call your local emergency services (911 in the [place] Do not drive yourself to the hospital. °Summary °· Acute respiratory distress syndrome is a life-threatening condition in which fluid collects in the lungs, which leads the lungs and other vital organs to fail. °· This condition usually develops following an infection, illness, surgery, or injury. °· Sudden  shortness of breath and rapid breathing are the main symptoms of acute respiratory distress syndrome. °· Treatment may include oxygen therapy, continuous positive airway pressure (CPAP), tracheostomy, lying on your stomach (prone position), medicines, fluids and nutrients given through an IV tube, compression stockings, and extra corporeal membrane oxygenation (ECMO). °This information is not intended to replace advice given to you by your health care provider. Make sure you discuss any questions you have with your health care provider. °Document Released: 01/14/2005 Document Revised: 01/01/2016 Document Reviewed: 01/01/2016 °Elsevier Interactive Patient Education © 2017 [employer] ° °

## 2017-06-29 NOTE — Progress Notes (Signed)
Transported in [facility] to private vehicle. Discharge home to self care.

## 2017-06-29 NOTE — Discharge Summary (Signed)
[facility] NAME: Theresa Leon    MR#:  [id number]  DATE OF BIRTH:  [birth year]  DATE OF ADMISSION:  06/26/2017   ADMITTING PHYSICIAN: Loletha Grayer, MD  DATE OF DISCHARGE:  06/29/17  PRIMARY CARE PHYSICIAN: Perrin Maltese, MD   ADMISSION DIAGNOSIS:   Hypoxia [R09.02] COPD with acute exacerbation ([place]) [J44.1]  DISCHARGE DIAGNOSIS:   Active Problems:   Acute respiratory failure with hypoxia ([place])   SECONDARY DIAGNOSIS:   Past Medical History:  Diagnosis Date  . Acute on chronic renal failure ([place])   . Anemia   . Asthma 1975  . COPD (chronic obstructive pulmonary disease) ([place])   . Diabetes ([place])   . Diabetes mellitus without complication ([place]) 8144  . Diastolic CHF, acute on chronic (HCC)   . H/O aortic valve replacement   . Heart disease   . Hypothyroid   . Liver cirrhosis secondary to NASH (nonalcoholic steatohepatitis) ([place])   . Morbid obesity ([place])   . 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 OSA (obstructive sleep apnea)     HOSPITAL COURSE:   62 year old female patient with history of COPD, cirrhosis of liver, [place], sleep apnea, diabetes mellitus type 2, obesity, hypothyroidism currently under hospitalist service for dyspnea and hypoxia  1. Hypoxia Present mainly on ambulation- resolved now Off oxygen now Has obesity hypoventilation Incentive spirometry  2. Obstructive sleep apnea CPAP at bedtime  3. COPD exacerbation IV Solu-Medrol and nebulization treatments- discharged on prednisone taper and inhalers and prn nebs  4. Streptococcal Bacteremia on IV Rocephin ABX- discharge on augmentin for 2 weeks -pending echocardiogram to r/o vegetations- will follow up ( has g/o aortic valve repair for a bicuspid valve) -repeat blood cultures negative so far  5. Hypotension resolved  6. Cirrhosis of liver secondary to [place] follow up.  7. Morbid  obesity Lifestyle modification recommended  Outpatient f/u in 1 week   DISCHARGE CONDITIONS:   Guarded  CONSULTS OBTAINED:   None  DRUG ALLERGIES:   Allergies  Allergen Reactions  . Furosemide Other (See Comments)    Pt states that this medication caused renal failure.    . Mold Extract [Trichophyton] Anaphylaxis  . Tetanus Toxoids Other (See Comments)    Pt states that her arm turns black.    Hermans Crigler [Beclomethasone] Other (See Comments)    Reaction:  Thrush   . Toprol Xl [Metoprolol Tartrate] Hives  . Hctz [Hydrochlorothiazide] Other (See Comments)    Reports renal failure   . Nickel   . Nitroglycerin Other (See Comments)    Pt states that this medication makes her BP bottom out.    . Other Hives, Swelling and Other (See Comments)    Pt states that she is allergic to steroids.     DISCHARGE MEDICATIONS:   Allergies as of 06/29/2017      Reactions   Furosemide Other (See Comments)   Pt states that this medication caused renal failure.     Mold Extract [trichophyton] Anaphylaxis   Tetanus Toxoids Other (See Comments)   Pt states that her arm turns black.     Qvar [beclomethasone] Other (See Comments)   Reaction:  Thrush    Toprol Xl [metoprolol Tartrate] Hives   Hctz [hydrochlorothiazide] Other (See Comments)   Reports renal failure   Nickel    Nitroglycerin Other (See Comments)   Pt states that this medication makes her BP  bottom out.     Other Hives, Swelling, Other (See Comments)   Pt states that she is allergic to steroids.        Medication List    TAKE these medications   acetaminophen 500 MG tablet Commonly known as:  TYLENOL Take 1,000 mg by mouth every 6 (six) hours as needed for mild pain or headache.   albuterol 108 (90 Base) MCG/ACT inhaler Commonly known as:  PROVENTIL HFA;VENTOLIN HFA Inhale 2 puffs into the lungs every 6 (six) hours as needed for wheezing or shortness of breath.   amoxicillin-clavulanate 875-125 MG tablet Commonly known  as:  AUGMENTIN Take 1 tablet by mouth every 12 (twelve) hours for 14 days.   atorvastatin 10 MG tablet Commonly known as:  LIPITOR Take 1 tablet (10 mg total) by mouth at bedtime. What changed:    medication strength  how much to take  when to take this   CALCIUM 600+D 600-400 MG-UNIT tablet Generic drug:  Calcium Carbonate-Vitamin D Take 1 tablet by mouth at bedtime.   cholecalciferol 1000 units tablet Commonly known as:  VITAMIN D Take 1,000 Units by mouth daily.   citalopram 40 MG tablet Commonly known as:  CELEXA Take 40 mg by mouth daily.   EPIPEN 2-PAK 0.3 mg/0.3 mL Soaj injection Generic drug:  EPINEPHrine Inject 0.3 mg into the muscle once as needed (for severe allergic reaction).   fluticasone 50 MCG/ACT nasal spray Commonly known as:  FLONASE Place 2 sprays into both nostrils daily as needed for rhinitis.   gabapentin 600 MG tablet Commonly known as:  NEURONTIN Take 600 mg by mouth 4 (four) times daily.   ipratropium-albuterol 0.5-2.5 (3) MG/3ML Soln Commonly known as:  DUONEB Take 3 mLs by nebulization 3 (three) times daily.   levothyroxine 125 MCG tablet Commonly known as:  SYNTHROID, LEVOTHROID Take 100 mcg by mouth daily before breakfast.   loratadine 10 MG tablet Commonly known as:  CLARITIN Take 10 mg by mouth daily.   metFORMIN 500 MG 24 hr tablet Commonly known as:  GLUCOPHAGE-XR Take 500 mg by mouth 2 (two) times daily.   montelukast 10 MG tablet Commonly known as:  SINGULAIR Take 10 mg by mouth at bedtime.   predniSONE 10 MG (21) Tbpk tablet Commonly known as:  STERAPRED UNI-PAK 21 TAB 6 tabs PO x 1 day 5 tabs PO x 1 day 4 tabs PO x 1 day 3 tabs PO x 1 day 2 tabs PO x 1 day 1 tab PO x 1 day and stop   sucralfate 1 g tablet Commonly known as:  CARAFATE Take 1 tablet (1 g total) by mouth 4 (four) times daily -  with meals and at bedtime.   torsemide 20 MG tablet Commonly known as:  DEMADEX Take 1 tablet by mouth daily.     traZODone 50 MG tablet Commonly known as:  DESYREL Take 50 mg by mouth at bedtime.   Vitamin D (Ergocalciferol) 50000 units Caps capsule Commonly known as:  DRISDOL Take 50,000 Units by mouth every 7 (seven) days. Fridays   XTAMPZA ER 9 MG C12a Generic drug:  oxyCODONE ER Take 1 capsule by mouth 3 (three) times daily.        DISCHARGE INSTRUCTIONS:   1. PCP f/u in 1 week  DIET:   Cardiac diet  ACTIVITY:   Activity as tolerated  OXYGEN:   Home Oxygen: No.  Oxygen Delivery: room air  DISCHARGE LOCATION:   home   If you experience  worsening of your admission symptoms, develop shortness of breath, life threatening emergency, suicidal or homicidal thoughts you must seek medical attention immediately by calling 911 or calling your MD immediately  if symptoms less severe.  You Must read complete instructions/literature along with all the possible adverse reactions/side effects for all the Medicines you take and that have been prescribed to you. Take any new Medicines after you have completely understood and accpet all the possible adverse reactions/side effects.   Please note  You were cared for by a hospitalist during your hospital stay. If you have any questions about your discharge medications or the care you received while you were in the hospital after you are discharged, you can call the unit and asked to speak with the hospitalist on call if the hospitalist that took care of you is not available. Once you are discharged, your primary care physician will handle any further medical issues. Please note that NO REFILLS for any discharge medications will be authorized once you are discharged, as it is imperative that you return to your primary care physician (or establish a relationship with a primary care physician if you do not have one) for your aftercare needs so that they can reassess your need for medications and monitor your lab values.    On the day of Discharge:   VITAL SIGNS:   Blood pressure 138/80, pulse 64, temperature 97.7 F (36.5 C), temperature source Oral, resp. rate (!) 21, height 5' 8"  (1.727 m), weight (!) 147 kg (324 lb), SpO2 96 %.  PHYSICAL EXAMINATION:   GENERAL:  62 y.o.-year-old obese patient lying in the bed with no acute distress.  EYES: Pupils equal, round, reactive to light and accommodation. No scleral icterus. Extraocular muscles intact.  HEENT: Head atraumatic, normocephalic. Oropharynx and nasopharynx clear.  NECK:  Supple, no jugular venous distention. No thyroid enlargement, no tenderness.  LUNGS: Normal breath sounds bilaterally, no wheezing, rales,rhonchi or crepitation. No use of accessory muscles of respiration.  Decreased bibasilar breath sounds CARDIOVASCULAR: S1, S2 normal. No rubs, or gallops. 3/6 systolic murmur present ABDOMEN: Soft, obese, nontender, nondistended. Bowel sounds present. No organomegaly or mass.  EXTREMITIES: No pedal edema, cyanosis, or clubbing.  NEUROLOGIC: Cranial nerves II through XII are intact. Muscle strength 5/5 in all extremities. Sensation intact. Gait not checked.  PSYCHIATRIC: The patient is alert and oriented x 3.  SKIN: No obvious rash, lesion, or ulcer   DATA REVIEW:   CBC Recent Labs  Lab 06/27/17 0556  WBC 8.7  HGB 13.1  HCT 37.7  PLT 73*    Chemistries  Recent Labs  Lab 06/26/17 0859  06/29/17 0427  NA 138   < > 139  K 4.0   < > 4.4  CL 99*   < > 100*  CO2 30   < > 33*  GLUCOSE 213*   < > 190*  BUN 19   < > 30*  CREATININE 1.27*   < > 0.99  CALCIUM 8.4*   < > 9.8  AST 20  --   --   ALT 14  --   --   ALKPHOS 72  --   --   BILITOT 1.9*  --   --    < > = values in this interval not displayed.     Microbiology Results  Results for orders placed or performed during the hospital encounter of 06/26/17  CULTURE, BLOOD (ROUTINE X 2) w Reflex to ID Panel     Status: Abnormal  Collection Time: 06/26/17  2:11 PM  Result Value Ref Range Status   Specimen  Description BLOOD RIGHT FOREARM  Final   Special Requests   Final    BOTTLES DRAWN AEROBIC AND ANAEROBIC Blood Culture adequate volume Performed at [facility], [address] [place], [place] [address]    Culture  Setup Time   Final    GRAM POSITIVE COCCI IN BOTH AEROBIC AND ANAEROBIC BOTTLES CRITICAL VALUE NOTED.  VALUE IS CONSISTENT WITH PREVIOUSLY REPORTED AND CALLED VALUE. Performed at [facility], [address] [place], [place] [address]    Culture (A)  Final    STREPTOCOCCUS GROUP G SUSCEPTIBILITIES PERFORMED ON PREVIOUS CULTURE WITHIN THE LAST 5 DAYS. Performed at [facility], [place] [address], [place], [place] [address]    Report Status 06/29/2017 FINAL  Final  CULTURE, BLOOD (ROUTINE X 2) w Reflex to ID Panel     Status: Abnormal   Collection Time: 06/26/17  2:12 PM  Result Value Ref Range Status   Specimen Description BLOOD RIGHT ANTECUBITAL  Final   Special Requests   Final    BOTTLES DRAWN AEROBIC AND ANAEROBIC Blood Culture adequate volume Performed at [facility], [address] [place], [place] [address]    Culture  Setup Time   Final    IN BOTH AEROBIC AND ANAEROBIC BOTTLES GRAM POSITIVE COCCI CRITICAL RESULT CALLED TO, READ BACK BY AND VERIFIED WITH: JASON ROBBINS @0152  06/27/17 FLC Performed at [facility], [place] [address], [place], [place] [address]    Culture STREPTOCOCCUS GROUP G (A)  Final   Report Status 06/29/2017 FINAL  Final   Organism ID, Bacteria STREPTOCOCCUS GROUP G  Final      Susceptibility   Streptococcus group g - MIC*    CLINDAMYCIN <=0.25 SENSITIVE Sensitive     AMPICILLIN <=0.25 SENSITIVE Sensitive     ERYTHROMYCIN <=0.12 SENSITIVE Sensitive     VANCOMYCIN <=0.12 SENSITIVE Sensitive     CEFTRIAXONE <=0.12 SENSITIVE Sensitive     LEVOFLOXACIN 0.5 SENSITIVE Sensitive     * STREPTOCOCCUS GROUP G  Blood Culture ID Panel (Reflexed)     Status: Abnormal   Collection Time: 06/26/17  2:12 PM    Result Value Ref Range Status   Enterococcus species NOT DETECTED NOT DETECTED Final   Listeria monocytogenes NOT DETECTED NOT DETECTED Final   Staphylococcus species NOT DETECTED NOT DETECTED Final   Staphylococcus aureus NOT DETECTED NOT DETECTED Final   Streptococcus species DETECTED (A) NOT DETECTED Final    Comment: Not Enterococcus species, Streptococcus agalactiae, Streptococcus pyogenes, or Streptococcus pneumoniae. C/ JASON ROBBINS @0152  06/27/17 FLC    Streptococcus agalactiae NOT DETECTED NOT DETECTED Final   Streptococcus pneumoniae NOT DETECTED NOT DETECTED Final   Streptococcus pyogenes NOT DETECTED NOT DETECTED Final   Acinetobacter baumannii NOT DETECTED NOT DETECTED Final   Enterobacteriaceae species NOT DETECTED NOT DETECTED Final   Enterobacter cloacae complex NOT DETECTED NOT DETECTED Final   Escherichia coli NOT DETECTED NOT DETECTED Final   Klebsiella oxytoca NOT DETECTED NOT DETECTED Final   Klebsiella pneumoniae NOT DETECTED NOT DETECTED Final   Proteus species NOT DETECTED NOT DETECTED Final   Serratia marcescens NOT DETECTED NOT DETECTED Final   Haemophilus influenzae NOT DETECTED NOT DETECTED Final   Neisseria meningitidis NOT DETECTED NOT DETECTED Final   Pseudomonas aeruginosa NOT DETECTED NOT DETECTED Final   Candida albicans NOT DETECTED NOT DETECTED Final   Candida glabrata NOT DETECTED NOT DETECTED  Final   Candida krusei NOT DETECTED NOT DETECTED Final   Candida parapsilosis NOT DETECTED NOT DETECTED Final   Candida tropicalis NOT DETECTED NOT DETECTED Final    Comment: Performed at [facility], [address] [place], [place] [address]  Respiratory Panel by PCR     Status: None   Collection Time: 06/26/17  3:48 PM  Result Value Ref Range Status   Adenovirus NOT DETECTED NOT DETECTED Final   Coronavirus 229E NOT DETECTED NOT DETECTED Final   Coronavirus HKU1 NOT DETECTED NOT DETECTED Final   Coronavirus NL63 NOT DETECTED NOT DETECTED  Final   Coronavirus OC43 NOT DETECTED NOT DETECTED Final   Metapneumovirus NOT DETECTED NOT DETECTED Final   Rhinovirus / Enterovirus NOT DETECTED NOT DETECTED Final   Influenza A NOT DETECTED NOT DETECTED Final   Influenza B NOT DETECTED NOT DETECTED Final   Parainfluenza Virus 1 NOT DETECTED NOT DETECTED Final   Parainfluenza Virus 2 NOT DETECTED NOT DETECTED Final   Parainfluenza Virus 3 NOT DETECTED NOT DETECTED Final   Parainfluenza Virus 4 NOT DETECTED NOT DETECTED Final   Respiratory Syncytial Virus NOT DETECTED NOT DETECTED Final   Bordetella pertussis NOT DETECTED NOT DETECTED Final   Chlamydophila pneumoniae NOT DETECTED NOT DETECTED Final   Mycoplasma pneumoniae NOT DETECTED NOT DETECTED Final    Comment: Performed at [facility], [place] [address], [place], [place] [address]  Urine Culture     Status: Abnormal   Collection Time: 06/26/17  4:05 PM  Result Value Ref Range Status   Specimen Description   Final    URINE, RANDOM Performed at [facility], [address] [place], [place] [address]    Special Requests   Final    Normal Performed at [facility], [address] [place], [place] [address]    Culture (A)  Final    <10,000 COLONIES/mL INSIGNIFICANT GROWTH Performed at [facility], [place] [address], [place], [place] [address]    Report Status 06/28/2017 FINAL  Final  CULTURE, BLOOD (ROUTINE X 2) w Reflex to ID Panel     Status: None (Preliminary result)   Collection Time: 06/28/17  1:52 PM  Result Value Ref Range Status   Specimen Description BLOOD LEFT ANTECUBITAL  Final   Special Requests   Final    BOTTLES DRAWN AEROBIC AND ANAEROBIC Blood Culture adequate volume   Culture   Final    NO GROWTH < 24 HOURS Performed at [facility], [address], [place], [place] [address]    Report Status PENDING  Incomplete  CULTURE, BLOOD (ROUTINE X 2) w Reflex to ID Panel     Status: None (Preliminary result)   Collection  Time: 06/28/17  1:59 PM  Result Value Ref Range Status   Specimen Description BLOOD BLOOD LEFT HAND  Final   Special Requests   Final    BOTTLES DRAWN AEROBIC AND ANAEROBIC Blood Culture adequate volume   Culture   Final    NO GROWTH < 24 HOURS Performed at [facility], [address], [place], [place] [address]    Report Status PENDING  Incomplete    RADIOLOGY:  No results found.   Management plans discussed with the patient, family and they are in agreement.  CODE STATUS:     Code Status Orders  (From admission, onward)        Start     Ordered   06/26/17 1334  Full code  Continuous     06/26/17 1333    Code Status History    Date Active Date Inactive Code Status Order ID Comments User Context   03/05/2017 2240 03/06/2017 2200 Full Code [id number]  Lance Coon, MD Inpatient   09/18/2015 1911 09/19/2015 0929 Full Code [id number]  Lytle Butte, MD ED   03/01/2015 2205 03/05/2015 1721 Full Code [id number]  Lance Coon, MD Inpatient   01/25/2014 1909 03/15/2014 1632 Full Code [id number]  Carney Harder Inpatient      TOTAL TIME TAKING CARE OF THIS PATIENT: 38 minutes.    Gladstone Lighter M.D on 06/29/2017 at 11:25 AM  Between 7am to 6pm - Pager - [phone]  After 6pm go to [email or web address] - Proofreader  [facility]  Office  [phone]  CC: Primary care physician; Perrin Maltese, MD   Note: This dictation was prepared with Dragon dictation along with smaller phrase technology. Any transcriptional errors that result from this process are unintentional.

## 2017-06-30 LAB — ECHOCARDIOGRAM COMPLETE
Height: 68 in
Weight: 5184 oz

## 2017-07-03 LAB — CULTURE, BLOOD (ROUTINE X 2)
Culture: NO GROWTH
Culture: NO GROWTH
SPECIAL REQUESTS: ADEQUATE
Special Requests: ADEQUATE

## 2017-07-28 ENCOUNTER — Other Ambulatory Visit: Payer: Self-pay

## 2017-07-28 ENCOUNTER — Inpatient Hospital Stay: Payer: Medicare HMO | Attending: Oncology | Admitting: Oncology

## 2017-07-28 ENCOUNTER — Encounter: Payer: Self-pay | Admitting: Oncology

## 2017-07-28 ENCOUNTER — Inpatient Hospital Stay: Payer: Medicare HMO

## 2017-07-28 VITALS — BP 98/65 | HR 71 | Temp 98.7°F | Resp 18 | Ht 67.0 in | Wt 334.8 lb

## 2017-07-28 DIAGNOSIS — D696 Thrombocytopenia, unspecified: Secondary | ICD-10-CM | POA: Insufficient documentation

## 2017-07-28 DIAGNOSIS — J449 Chronic obstructive pulmonary disease, unspecified: Secondary | ICD-10-CM | POA: Insufficient documentation

## 2017-07-28 DIAGNOSIS — K7460 Unspecified cirrhosis of liver: Secondary | ICD-10-CM | POA: Diagnosis not present

## 2017-07-28 DIAGNOSIS — Z87891 Personal history of nicotine dependence: Secondary | ICD-10-CM | POA: Diagnosis not present

## 2017-07-28 DIAGNOSIS — K7581 Nonalcoholic steatohepatitis (NASH): Secondary | ICD-10-CM | POA: Diagnosis not present

## 2017-07-28 DIAGNOSIS — E119 Type 2 diabetes mellitus without complications: Secondary | ICD-10-CM | POA: Insufficient documentation

## 2017-07-28 LAB — CBC WITH DIFFERENTIAL/PLATELET
BASOS ABS: 0 10*3/uL (ref 0–0.1)
Basophils Relative: 1 %
EOS PCT: 5 %
Eosinophils Absolute: 0.2 10*3/uL (ref 0–0.7)
HCT: 41.2 % (ref 35.0–47.0)
HEMOGLOBIN: 14.1 g/dL (ref 12.0–16.0)
LYMPHS ABS: 2.4 10*3/uL (ref 1.0–3.6)
LYMPHS PCT: 45 %
MCH: 31.1 pg (ref 26.0–34.0)
MCHC: 34.1 g/dL (ref 32.0–36.0)
MCV: 91 fL (ref 80.0–100.0)
Monocytes Absolute: 0.4 10*3/uL (ref 0.2–0.9)
Monocytes Relative: 7 %
NEUTROS ABS: 2.2 10*3/uL (ref 1.4–6.5)
NEUTROS PCT: 42 %
PLATELETS: 105 10*3/uL — AB (ref 150–440)
RBC: 4.52 MIL/uL (ref 3.80–5.20)
RDW: 13.6 % (ref 11.5–14.5)
WBC: 5.3 10*3/uL (ref 3.6–11.0)

## 2017-07-28 LAB — FOLATE: FOLATE: 17.1 ng/mL (ref >5.9)

## 2017-07-28 LAB — VITAMIN B12: Vitamin B-12: 233 pg/mL (ref 180–914)

## 2017-07-28 LAB — PATHOLOGIST SMEAR REVIEW

## 2017-07-28 NOTE — Progress Notes (Signed)
Patient here for follow up. No concerns voiced.  °

## 2017-07-29 LAB — HEPATITIS PANEL, ACUTE
HCV Ab: <0.1 s/co ratio (ref 0.0–0.9)
HEP B C IGM: NEGATIVE
HEP B S AG: NEGATIVE
Hep A IgM: NEGATIVE

## 2017-07-29 NOTE — Progress Notes (Signed)
Hematology/Oncology Consult note [facility] Telephone:([phone] [phone]   Patient Care Team: Perrin Maltese, MD as PCP - General (Internal Medicine) Bary Castilla Forest Gleason, MD (General Surgery)  REFERRING PROVIDER: Dr.Khan.  CHIEF COMPLAINTS/REASON FOR VISIT:  Evaluation of thrombocytopenia,   HISTORY OF PRESENTING ILLNESS:  Theresa Leon is a  62 y.o.  female with PMH listed below who was referred to me for evaluation of thrombocytopenia.  Patient recently had lab work done which revealed thrombocytopenia, platelet counts 73,000.   Reviewed patient's previous labs, thrombocytopenia duration is chronic, dated back to 2013. No aggravating or improving factors.  Associated symptoms: not associated with weight loss, easy bruising, hematochezia, hemoptysis.  Context: history of NASH related liver cirrhosis. Follows up with Dr.Elliot.  Recent admission due to COPD exacerbation and acute respiratory failure and strep bacteremia. Chronic SOB and fatigue.   Review of Systems  Constitutional: Positive for malaise/fatigue. Negative for fever.  HENT: Negative for ear discharge and ear pain.   Eyes: Negative for photophobia and pain.  Respiratory: Positive for shortness of breath. Negative for cough and sputum production.   Cardiovascular: Negative for chest pain, orthopnea and claudication.  Gastrointestinal: Negative for nausea and vomiting.  Genitourinary: Negative for dysuria.  Musculoskeletal: Negative for myalgias and neck pain.  Skin: Negative for rash.  Neurological: Negative for dizziness and weakness.  Endo/Heme/Allergies: Does not bruise/bleed easily.  Psychiatric/Behavioral: Negative for depression and suicidal ideas.    MEDICAL HISTORY:  Past Medical History:  Diagnosis Date  . Acute on chronic renal failure ([place])   . Anemia   . Asthma 1975  . COPD (chronic obstructive pulmonary disease) ([place])   . Diabetes ([place])   . Diabetes mellitus  without complication ([place]) 6160  . Diastolic CHF, acute on chronic (HCC)   . H/O aortic valve replacement   . Heart disease   . Hypothyroid   . Liver cirrhosis secondary to NASH (nonalcoholic steatohepatitis) ([place])   . Morbid obesity ([place])   . 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 OSA (obstructive sleep apnea)     SURGICAL HISTORY: Past Surgical History:  Procedure Laterality Date  . aortic valve relaced   2008  . BACK SURGERY     AS CHILD  . BREAST BIOPSY Left 04/24/2011   left breast bx neg done by dr byrnett  . BREAST SURGERY  September 25, 2011   intraductal papilloma of the left breast  . CHOLECYSTECTOMY  2007  . COLONOSCOPY WITH PROPOFOL N/A 08/04/2014   Procedure: COLONOSCOPY WITH PROPOFOL;  Surgeon: Manya Silvas, MD;  Location: [facility] ENDOSCOPY;  Service: Endoscopy;  Laterality: N/A;  . ESOPHAGOGASTRODUODENOSCOPY Left 03/03/2015   Procedure: ESOPHAGOGASTRODUODENOSCOPY (EGD);  Surgeon: Manya Silvas, MD;  Location: [facility] ENDOSCOPY;  Service: Endoscopy;  Laterality: Left;  . ESOPHAGOGASTRODUODENOSCOPY (EGD) WITH PROPOFOL  08/04/2014   Procedure: ESOPHAGOGASTRODUODENOSCOPY (EGD) WITH PROPOFOL;  Surgeon: Manya Silvas, MD;  Location: [facility] ENDOSCOPY;  Service: Endoscopy;;  . ESOPHAGOGASTRODUODENOSCOPY (EGD) WITH PROPOFOL N/A 04/09/2016   Procedure: ESOPHAGOGASTRODUODENOSCOPY (EGD) WITH PROPOFOL;  Surgeon: Lucilla Lame, MD;  Location: [facility] ENDOSCOPY;  Service: Endoscopy;  Laterality: N/A;  . PHRENIC NERVE PACEMAKER IMPLANTATION  2009  . TRACHEOSTOMY TUBE PLACEMENT N/A 02/04/2014   Procedure: TRACHEOSTOMY;  Surgeon: Rozetta Nunnery, MD;  Location: [place];  Service: ENT;  Laterality: N/A;    SOCIAL HISTORY: Social History   Socioeconomic History  . Marital status: Widowed    Spouse name:  Not on file  . Number of children: Not on file  . Years of education: Not on file  . Highest education level: Not on file  Occupational History  . Not  on file  Social Needs  . Financial resource strain: Not on file  . Food insecurity:    Worry: Not on file    Inability: Not on file  . Transportation needs:    Medical: Not on file    Non-medical: Not on file  Tobacco Use  . Smoking status: Former Smoker    Last attempt to quit: 07/28/2008    Years since quitting: 9.0  . Smokeless tobacco: Never Used  Substance and Sexual Activity  . Alcohol use: No  . Drug use: No  . Sexual activity: Not on file  Lifestyle  . Physical activity:    Days per week: Not on file    Minutes per session: Not on file  . Stress: Not on file  Relationships  . Social connections:    Talks on phone: Not on file    Gets together: Not on file    Attends religious service: Not on file    Active member of club or organization: Not on file    Attends meetings of clubs or organizations: Not on file    Relationship status: Not on file  . Intimate partner violence:    Fear of current or ex partner: Not on file    Emotionally abused: Not on file    Physically abused: Not on file    Forced sexual activity: Not on file  Other Topics Concern  . Not on file  Social History Narrative  . Not on file    FAMILY HISTORY: Family History  Problem Relation Age of Onset  . Skin telangiectasia Mother   . Paget's disease of bone Mother   . Cancer Mother        skin  . Breast cancer Maternal Grandmother   . Cancer Maternal Grandmother        ovarian, pancreatic  . Alzheimer's disease Father   . Atrial fibrillation Father   . Lung cancer Maternal Grandfather     ALLERGIES:  is allergic to furosemide; mold extract [trichophyton]; tetanus toxoids; qvar [beclomethasone]; toprol xl [metoprolol tartrate]; hctz [hydrochlorothiazide]; nickel; nitroglycerin; and other.  MEDICATIONS:  Current Outpatient Medications  Medication Sig Dispense Refill  . acetaminophen (TYLENOL) 500 MG tablet Take 1,000 mg by mouth every 6 (six) hours as needed for mild pain or headache.      . albuterol (PROVENTIL HFA;VENTOLIN HFA) 108 (90 BASE) MCG/ACT inhaler Inhale 2 puffs into the lungs every 6 (six) hours as needed for wheezing or shortness of breath.     Marland Kitchen atorvastatin (LIPITOR) 10 MG tablet Take 1 tablet (10 mg total) by mouth at bedtime. 30 tablet 1  . Calcium Carbonate-Vitamin D (CALCIUM 600+D) 600-400 MG-UNIT tablet Take 1 tablet by mouth at bedtime.    . cholecalciferol (VITAMIN D) 1000 units tablet Take 1,000 Units by mouth daily.    . citalopram (CELEXA) 40 MG tablet Take 40 mg by mouth daily.    Marland Kitchen EPINEPHrine (EPIPEN 2-PAK) 0.3 mg/0.3 mL IJ SOAJ injection Inject 0.3 mg into the muscle once as needed (for severe allergic reaction).    . fluticasone (FLONASE) 50 MCG/ACT nasal spray Place 2 sprays into both nostrils daily as needed for rhinitis.     Marland Kitchen gabapentin (NEURONTIN) 600 MG tablet Take 600 mg by mouth 4 (four) times daily.     Marland Kitchen  levothyroxine (SYNTHROID, LEVOTHROID) 125 MCG tablet Take 100 mcg by mouth daily before breakfast.     . loratadine (CLARITIN) 10 MG tablet Take 10 mg by mouth daily.    . metFORMIN (GLUCOPHAGE-XR) 500 MG 24 hr tablet Take 500 mg by mouth 2 (two) times daily.     . montelukast (SINGULAIR) 10 MG tablet Take 10 mg by mouth at bedtime.    . sucralfate (CARAFATE) 1 g tablet Take 1 tablet (1 g total) by mouth 4 (four) times daily -  with meals and at bedtime. 90 tablet 6  . torsemide (DEMADEX) 20 MG tablet Take 1 tablet by mouth daily.    . traZODone (DESYREL) 50 MG tablet Take 50 mg by mouth at bedtime.     Marland Kitchen XTAMPZA ER 9 MG C12A Take 1 capsule by mouth 3 (three) times daily.    Marland Kitchen ipratropium-albuterol (DUONEB) 0.5-2.5 (3) MG/3ML SOLN Take 3 mLs by nebulization 3 (three) times daily. (Patient not taking: Reported on 07/28/2017) 360 mL 0  . predniSONE (STERAPRED UNI-PAK 21 TAB) 10 MG (21) TBPK tablet 6 tabs PO x 1 day 5 tabs PO x 1 day 4 tabs PO x 1 day 3 tabs PO x 1 day 2 tabs PO x 1 day 1 tab PO x 1 day and stop (Patient not taking: Reported  on 07/28/2017) 21 tablet 0  . Vitamin D, Ergocalciferol, (DRISDOL) 50000 units CAPS capsule Take 50,000 Units by mouth every 7 (seven) days. Fridays     No current facility-administered medications for this visit.      PHYSICAL EXAMINATION: ECOG PERFORMANCE STATUS: 2 - Symptomatic, <50% confined to bed Vitals:   07/28/17 1135  BP: 98/65  Pulse: 71  Resp: 18  Temp: 98.7 F (37.1 C)   Filed Weights   07/28/17 1135  Weight: (!) 334 lb 12.8 oz (151.9 kg)    Physical Exam  Constitutional: She is oriented to person, place, and time. No distress.  Morbid obese.   HENT:  Head: Normocephalic and atraumatic.  Right Ear: External ear normal.  Left Ear: External ear normal.  Mouth/Throat: Oropharynx is clear and moist.  Eyes: Pupils are equal, round, and reactive to light. EOM are normal. No scleral icterus.  Neck: Normal range of motion. Neck supple.  Cardiovascular: Normal rate, regular rhythm and normal heart sounds.  No murmur heard. Pulmonary/Chest: Effort normal. No respiratory distress. She has no wheezes.  Decreased breath sounds bilaterally.   Abdominal: Soft. Bowel sounds are normal. She exhibits no distension. There is no tenderness.  Musculoskeletal: Normal range of motion. She exhibits no edema.  Lymphadenopathy:    She has no cervical adenopathy.  Neurological: She is alert and oriented to person, place, and time. No cranial nerve deficit.  Skin: Skin is warm and dry. No rash noted.  Psychiatric: She has a normal mood and affect. Her behavior is normal.     LABORATORY DATA:  I have reviewed the data as listed Lab Results  Component Value Date   WBC 5.3 07/28/2017   HGB 14.1 07/28/2017   HCT 41.2 07/28/2017   MCV 91.0 07/28/2017   PLT 105 (L) 07/28/2017   Recent Labs    09/24/16 1831  05/04/17 1145 06/26/17 0859 06/27/17 0556 06/29/17 0427  NA 139   < > 139 138 134* 139  K 4.0   < > 3.9 4.0 4.1 4.4  CL 101   < > 102 99* 99* 100*  CO2 31   < > 29 30  25  33*  GLUCOSE 117*   < > 186* 213* 357* 190*  BUN 18   < > 22* 19 27* 30*  CREATININE 0.97   < > 0.90 1.27* 1.18* 0.99  CALCIUM 9.0   < > 9.3 8.4* 8.4* 9.8  GFRNONAA >60   < > >60 45* 49* >60  GFRAA >60   < > >60 52* 57* >60  PROT 7.0  --  7.0 7.1  --   --   ALBUMIN 3.6  --  3.8 3.8  --   --   AST 32  --  24 20  --   --   ALT 30  --  16 14  --   --   ALKPHOS 64  --  61 72  --   --   BILITOT 2.0*  --  2.3* 1.9*  --   --   BILIDIR  --   --  0.3  --   --   --   IBILI  --   --  2.0*  --   --   --    < > = values in this interval not displayed.   Iron/TIBC/Ferritin/ %Sat    Component Value Date/Time   IRON 55 03/13/2016 1452   IRON 26 (L) 01/16/2014 1900   TIBC 408 03/13/2016 1452   TIBC 478 (H) 01/16/2014 1900   FERRITIN 14 03/13/2016 1452   FERRITIN 6 (L) 01/16/2014 1900   IRONPCTSAT 14 03/13/2016 1452   IRONPCTSAT 5 01/16/2014 1900        ASSESSMENT & PLAN:  1. Thrombocytopenia ([place])   2. Liver cirrhosis secondary to NASH [facility])    Discussed with patient that her thrombocytopenia is most likely due to her chronic liver disease and possible splenomegaly.  Will order limited work up to rule out other etiology.  Repeat cbc, check smear, LDH, folate and vitamin B12, hepatitis, HIV check ultrasound of the abdomen.   Repeat cbc showed platelet count improved to 105,000, at her baseline. Previous thrombocytopenia likely due to acute infection.  Will hold additional work up.  # Patient follow-up Dr.B in approximately 2 weeks to review the above results.   Orders Placed This Encounter  Procedures  . US Abdomen Complete    Standing Status:   Future    Standing Expiration Date:   07/29/2018    Order Specific Question:   Reason for Exam (SYMPTOM  OR DIAGNOSIS REQUIRED)    Answer:   thrombocytosis    Order Specific Question:   Preferred imaging location?    Answer:   [facility]  . CBC with Differential/Platelet    Standing Status:   Future    Number of Occurrences:   1      Standing Expiration Date:   07/29/2018  . Pathologist smear review    Standing Status:   Future    Number of Occurrences:   1    Standing Expiration Date:   07/29/2018  . Hepatitis panel, acute    Standing Status:   Future    Number of Occurrences:   1    Standing Expiration Date:   07/29/2018  . Vitamin B12    Standing Status:   Future    Number of Occurrences:   1    Standing Expiration Date:   07/29/2018  . Folate    Standing Status:   Future    Number of Occurrences:   1    Standing Expiration Date:   07/29/2018  All questions were answered. The patient knows to call the clinic with any problems questions or concerns.  Thank you for this kind referral and the opportunity to participate in the care of this patient. A copy of today's note is routed to referring provider  Total face to face encounter time for this patient visit was 80mn. >50% of the time was  spent in counseling and coordination of care.    ZEarlie Server MD, PhD Hematology Oncology CSheperd Hill Hospitalat ALadd Memorial HospitalPager- [phone]/02/2017

## 2017-08-01 ENCOUNTER — Ambulatory Visit
Admission: RE | Admit: 2017-08-01 | Discharge: 2017-08-01 | Disposition: A | Discharge status: Home / Self Care | Payer: Medicare HMO | Source: Ambulatory Visit | Attending: Oncology | Admitting: Oncology

## 2017-08-01 DIAGNOSIS — Z9049 Acquired absence of other specified parts of digestive tract: Secondary | ICD-10-CM | POA: Diagnosis not present

## 2017-08-01 DIAGNOSIS — D696 Thrombocytopenia, unspecified: Secondary | ICD-10-CM | POA: Insufficient documentation

## 2017-08-01 DIAGNOSIS — K7460 Unspecified cirrhosis of liver: Secondary | ICD-10-CM | POA: Insufficient documentation

## 2017-08-13 ENCOUNTER — Inpatient Hospital Stay: Payer: Medicare HMO | Admitting: Internal Medicine

## 2017-08-13 NOTE — Progress Notes (Deleted)
[place] NOTE  Patient Care Team: Perrin Maltese, MD as PCP - General (Internal Medicine) Robert Bellow, MD (General Surgery)  CHIEF COMPLAINTS/PURPOSE OF CONSULTATION:   # April 2017- IRON DEFICIENCY ANEMIA sec to chronic GI blood loss; [Hb-7/Feb 2017] Recom IV ferrahem  # Chronic GI blood loss- EDG/capsule [feb 2017]- gastrtitis ? NSAIDs/COLO-July 2016/ [Dr.Elliot]  # Bovine heart valve [not on anti-coagulation]  HISTORY OF PRESENTING ILLNESS:  Theresa Leon 62 y.o.  female morbidly obese multiple medical problems history of iron deficiency anemia likely GI blood loss/unclear source is here for follow-up.  Patient complains of insomnia- question related to increased dose of metformin. She continues to deny any blood in stools black stools. Chronic abdominal pain- question attributable to abdominal hernia. Not any worse. Denies any bleeding. She is awaiting evaluation with surgery for possible gastric bypass.  ROS: A complete 10 point review of system is done which is negative except mentioned above in history of present illness  MEDICAL HISTORY:  Past Medical History:  Diagnosis Date  . Acute on chronic renal failure ([place])   . Anemia   . Asthma 1975  . COPD (chronic obstructive pulmonary disease) ([place])   . Diabetes ([place])   . Diabetes mellitus without complication ([place]) 6767  . Diastolic CHF, acute on chronic (HCC)   . H/O aortic valve replacement   . Heart disease   . Hypothyroid   . Liver cirrhosis secondary to NASH (nonalcoholic steatohepatitis) ([place])   . Morbid obesity ([place])   . 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 OSA (obstructive sleep apnea)     SURGICAL HISTORY: Past Surgical History:  Procedure Laterality Date  . aortic valve relaced   2008  . BACK SURGERY     AS CHILD  . BREAST BIOPSY Left 04/24/2011   left breast bx neg done by dr byrnett  . BREAST SURGERY  September 25, 2011   intraductal  papilloma of the left breast  . CHOLECYSTECTOMY  2007  . COLONOSCOPY WITH PROPOFOL N/A 08/04/2014   Procedure: COLONOSCOPY WITH PROPOFOL;  Surgeon: Manya Silvas, MD;  Location: [facility] ENDOSCOPY;  Service: Endoscopy;  Laterality: N/A;  . ESOPHAGOGASTRODUODENOSCOPY Left 03/03/2015   Procedure: ESOPHAGOGASTRODUODENOSCOPY (EGD);  Surgeon: Manya Silvas, MD;  Location: [facility] ENDOSCOPY;  Service: Endoscopy;  Laterality: Left;  . ESOPHAGOGASTRODUODENOSCOPY (EGD) WITH PROPOFOL  08/04/2014   Procedure: ESOPHAGOGASTRODUODENOSCOPY (EGD) WITH PROPOFOL;  Surgeon: Manya Silvas, MD;  Location: [facility] ENDOSCOPY;  Service: Endoscopy;;  . ESOPHAGOGASTRODUODENOSCOPY (EGD) WITH PROPOFOL N/A 04/09/2016   Procedure: ESOPHAGOGASTRODUODENOSCOPY (EGD) WITH PROPOFOL;  Surgeon: Lucilla Lame, MD;  Location: [facility] ENDOSCOPY;  Service: Endoscopy;  Laterality: N/A;  . PHRENIC NERVE PACEMAKER IMPLANTATION  2009  . TRACHEOSTOMY TUBE PLACEMENT N/A 02/04/2014   Procedure: TRACHEOSTOMY;  Surgeon: Rozetta Nunnery, MD;  Location: [place];  Service: ENT;  Laterality: N/A;    SOCIAL HISTORY: Social History   Socioeconomic History  . Marital status: Widowed    Spouse name: Not on file  . Number of children: Not on file  . Years of education: Not on file  . Highest education level: Not on file  Occupational History  . Not on file  Social Needs  . Financial resource strain: Not on file  . Food insecurity:    Worry: Not on file    Inability: Not on file  . Transportation needs:    Medical: Not on file  Non-medical: Not on file  Tobacco Use  . Smoking status: Former Smoker    Last attempt to quit: 07/28/2008    Years since quitting: 9.0  . Smokeless tobacco: Never Used  Substance and Sexual Activity  . Alcohol use: No  . Drug use: No  . Sexual activity: Not on file  Lifestyle  . Physical activity:    Days per week: Not on file    Minutes per session: Not on file  . Stress: Not on file  Relationships  . Social  connections:    Talks on phone: Not on file    Gets together: Not on file    Attends religious service: Not on file    Active member of club or organization: Not on file    Attends meetings of clubs or organizations: Not on file    Relationship status: Not on file  . Intimate partner violence:    Fear of current or ex partner: Not on file    Emotionally abused: Not on file    Physically abused: Not on file    Forced sexual activity: Not on file  Other Topics Concern  . Not on file  Social History Narrative  . Not on file    FAMILY HISTORY: Family History  Problem Relation Age of Onset  . Skin telangiectasia Mother   . Paget's disease of bone Mother   . Cancer Mother        skin  . Breast cancer Maternal Grandmother   . Cancer Maternal Grandmother        ovarian, pancreatic  . Alzheimer's disease Father   . Atrial fibrillation Father   . Lung cancer Maternal Grandfather     ALLERGIES:  is allergic to furosemide; mold extract [trichophyton]; tetanus toxoids; qvar [beclomethasone]; toprol xl [metoprolol tartrate]; hctz [hydrochlorothiazide]; nickel; nitroglycerin; and other.  MEDICATIONS:  Current Outpatient Medications  Medication Sig Dispense Refill  . acetaminophen (TYLENOL) 500 MG tablet Take 1,000 mg by mouth every 6 (six) hours as needed for mild pain or headache.    . albuterol (PROVENTIL HFA;VENTOLIN HFA) 108 (90 BASE) MCG/ACT inhaler Inhale 2 puffs into the lungs every 6 (six) hours as needed for wheezing or shortness of breath.     Marland Kitchen atorvastatin (LIPITOR) 10 MG tablet Take 1 tablet (10 mg total) by mouth at bedtime. 30 tablet 1  . Calcium Carbonate-Vitamin D (CALCIUM 600+D) 600-400 MG-UNIT tablet Take 1 tablet by mouth at bedtime.    . cholecalciferol (VITAMIN D) 1000 units tablet Take 1,000 Units by mouth daily.    . citalopram (CELEXA) 40 MG tablet Take 40 mg by mouth daily.    Marland Kitchen EPINEPHrine (EPIPEN 2-PAK) 0.3 mg/0.3 mL IJ SOAJ injection Inject 0.3 mg into the  muscle once as needed (for severe allergic reaction).    . fluticasone (FLONASE) 50 MCG/ACT nasal spray Place 2 sprays into both nostrils daily as needed for rhinitis.     Marland Kitchen gabapentin (NEURONTIN) 600 MG tablet Take 600 mg by mouth 4 (four) times daily.     Marland Kitchen ipratropium-albuterol (DUONEB) 0.5-2.5 (3) MG/3ML SOLN Take 3 mLs by nebulization 3 (three) times daily. (Patient not taking: Reported on 07/28/2017) 360 mL 0  . levothyroxine (SYNTHROID, LEVOTHROID) 125 MCG tablet Take 100 mcg by mouth daily before breakfast.     . loratadine (CLARITIN) 10 MG tablet Take 10 mg by mouth daily.    . metFORMIN (GLUCOPHAGE-XR) 500 MG 24 hr tablet Take 500 mg by mouth 2 (two) times daily.     Marland Kitchen  montelukast (SINGULAIR) 10 MG tablet Take 10 mg by mouth at bedtime.    . predniSONE (STERAPRED UNI-PAK 21 TAB) 10 MG (21) TBPK tablet 6 tabs PO x 1 day 5 tabs PO x 1 day 4 tabs PO x 1 day 3 tabs PO x 1 day 2 tabs PO x 1 day 1 tab PO x 1 day and stop (Patient not taking: Reported on 07/28/2017) 21 tablet 0  . sucralfate (CARAFATE) 1 g tablet Take 1 tablet (1 g total) by mouth 4 (four) times daily -  with meals and at bedtime. 90 tablet 6  . torsemide (DEMADEX) 20 MG tablet Take 1 tablet by mouth daily.    . traZODone (DESYREL) 50 MG tablet Take 50 mg by mouth at bedtime.     . Vitamin D, Ergocalciferol, (DRISDOL) 50000 units CAPS capsule Take 50,000 Units by mouth every 7 (seven) days. Fridays    . XTAMPZA ER 9 MG C12A Take 1 capsule by mouth 3 (three) times daily.     No current facility-administered medications for this visit.       Marland Kitchen  PHYSICAL EXAMINATION:   There were no vitals filed for this visit. There were no vitals filed for this visit.  GENERAL: Well-nourished well-developed; Alert, no distress and comfortable. Obese in a wheel chair.   EYES: positive for pallor OROPHARYNX: no thrush or ulceration. NECK: supple, no masses felt LYMPH:  no palpable lymphadenopathy in the cervical, axillary or inguinal  regions LUNGS: clear to auscultation and  No wheeze or crackles HEART/CVS: regular rate & rhythm and no murmurs; 1 plus bil lower extremity edema ABDOMEN: abdomen soft, non-tender and normal bowel sounds Musculoskeletal:no cyanosis of digits and no clubbing  PSYCH: alert & oriented x 3 with fluent speech NEURO: no focal motor/sensory deficits SKIN:  no rashes or significant lesions.   LABORATORY DATA:  I have reviewed the data as listed Lab Results  Component Value Date   WBC 5.3 07/28/2017   HGB 14.1 07/28/2017   HCT 41.2 07/28/2017   MCV 91.0 07/28/2017   PLT 105 (L) 07/28/2017   Recent Labs    09/24/16 1831  05/04/17 1145 06/26/17 0859 06/27/17 0556 06/29/17 0427  NA 139   < > 139 138 134* 139  K 4.0   < > 3.9 4.0 4.1 4.4  CL 101   < > 102 99* 99* 100*  CO2 31   < > 29 30 25  33*  GLUCOSE 117*   < > 186* 213* 357* 190*  BUN 18   < > 22* 19 27* 30*  CREATININE 0.97   < > 0.90 1.27* 1.18* 0.99  CALCIUM 9.0   < > 9.3 8.4* 8.4* 9.8  GFRNONAA >60   < > >60 45* 49* >60  GFRAA >60   < > >60 52* 57* >60  PROT 7.0  --  7.0 7.1  --   --   ALBUMIN 3.6  --  3.8 3.8  --   --   AST 32  --  24 20  --   --   ALT 30  --  16 14  --   --   ALKPHOS 64  --  61 72  --   --   BILITOT 2.0*  --  2.3* 1.9*  --   --   BILIDIR  --   --  0.3  --   --   --   IBILI  --   --  2.0*  --   --   --    < > =  values in this interval not displayed.     ASSESSMENT & PLAN:   No problem-specific Assessment & Plan notes found for this encounter.     Cammie Sickle, MD 08/13/2017 8:19 AM

## 2017-08-13 NOTE — Assessment & Plan Note (Deleted)
#    IRON DEFICIENCY ANEMIA- Likely GI etiology [status post GI workup as above; no clear source]. S/p Ferrahem in oct 2017. Check CBC CMP F74 folic acid and iron studies ferritin today. We will plan IV iron based upon labs.  # Insomnia- ? Etiology ? Sec to metformin; defer to PCP.   # Thrombocytopenia- 90-100s- ? Etiology. 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 Cirrhosis/ no splenomegaly] Discussed re: prednisone [insomnia; weight gain; elevated Blood glucose]/ IVIG as a possibility if she plans to have gastric bypass; also discussed re: Bone marrow bx- hold off for now. She will call if gastric bypass surgery is planned in future.   # Recheck CBC ferritin and iron studies in 6 months- few days prior/possible ferriheme. Follow-up in 6 months.

## 2017-08-29 ENCOUNTER — Other Ambulatory Visit: Payer: Self-pay

## 2017-08-29 ENCOUNTER — Encounter: Payer: Self-pay | Admitting: Internal Medicine

## 2017-08-29 ENCOUNTER — Inpatient Hospital Stay: Payer: Medicare HMO | Attending: Internal Medicine | Admitting: Internal Medicine

## 2017-08-29 VITALS — BP 128/82 | HR 66 | Temp 97.9°F | Resp 22 | Ht 67.0 in | Wt 334.5 lb

## 2017-08-29 DIAGNOSIS — J449 Chronic obstructive pulmonary disease, unspecified: Secondary | ICD-10-CM | POA: Diagnosis not present

## 2017-08-29 DIAGNOSIS — K7460 Unspecified cirrhosis of liver: Secondary | ICD-10-CM | POA: Diagnosis not present

## 2017-08-29 DIAGNOSIS — Z87891 Personal history of nicotine dependence: Secondary | ICD-10-CM | POA: Insufficient documentation

## 2017-08-29 DIAGNOSIS — Z79899 Other long term (current) drug therapy: Secondary | ICD-10-CM | POA: Diagnosis not present

## 2017-08-29 DIAGNOSIS — Z950 Presence of cardiac pacemaker: Secondary | ICD-10-CM | POA: Diagnosis not present

## 2017-08-29 DIAGNOSIS — Z952 Presence of prosthetic heart valve: Secondary | ICD-10-CM | POA: Insufficient documentation

## 2017-08-29 DIAGNOSIS — D500 Iron deficiency anemia secondary to blood loss (chronic): Secondary | ICD-10-CM | POA: Diagnosis present

## 2017-08-29 DIAGNOSIS — N189 Chronic kidney disease, unspecified: Secondary | ICD-10-CM | POA: Insufficient documentation

## 2017-08-29 DIAGNOSIS — E039 Hypothyroidism, unspecified: Secondary | ICD-10-CM | POA: Diagnosis not present

## 2017-08-29 DIAGNOSIS — K922 Gastrointestinal hemorrhage, unspecified: Secondary | ICD-10-CM | POA: Insufficient documentation

## 2017-08-29 DIAGNOSIS — E1122 Type 2 diabetes mellitus with diabetic chronic kidney disease: Secondary | ICD-10-CM | POA: Insufficient documentation

## 2017-08-29 DIAGNOSIS — E119 Type 2 diabetes mellitus without complications: Secondary | ICD-10-CM

## 2017-08-29 DIAGNOSIS — R5382 Chronic fatigue, unspecified: Secondary | ICD-10-CM | POA: Insufficient documentation

## 2017-08-29 NOTE — Assessment & Plan Note (Addendum)
#    IRON DEFICIENCY ANEMIA- Likely GI etiology [status post GI workup as above; no clear source].  Today hemoglobin 14.  Stable.  No IV iron.  #Chronic mild thrombocytopenia-today 105.  Stable.  ITP versus others [no splenomegaly and a CT scan 2017.]  #Cirrhosis-question etiology/fatty liver; stable.  #Chronic fatigue-unclear etiology.  Defer to PCP.  # Recheck CBC ferritin and iron studies in 6 months- few days prior/possible ferriheme. Follow-up in 6 months.

## 2017-08-29 NOTE — Progress Notes (Signed)
[place] NOTE  Patient Care Team: Perrin Maltese, MD as PCP - General (Internal Medicine) Robert Bellow, MD (General Surgery)  CHIEF COMPLAINTS/PURPOSE OF CONSULTATION:   # April 2017- IRON DEFICIENCY ANEMIA sec to chronic GI blood loss; [Hb-7/Feb 2017] status post IV on.  # Chronic GI blood loss- EDG/capsule [feb 2017]- gastrtitis ? NSAIDs/COLO-July 2016/ [Dr.Elliot]  # Bovine heart valve [not on anti-coagulation]  HISTORY OF PRESENTING ILLNESS:  Theresa Leon 62 y.o.  female morbidly obese multiple medical problems history of iron deficiency anemia likely GI blood loss/unclear source is here for follow-up.  Patient complains of ongoing fatigue.  Otherwise denies any blood in stools black or stools.  Not has needed and in the last 1 to 2 years.  ROS: A complete 10 point review of system is done which is negative except mentioned above in history of present illness  MEDICAL HISTORY:  Past Medical History:  Diagnosis Date  . Acute on chronic renal failure ([place])   . Anemia   . Asthma 1975  . COPD (chronic obstructive pulmonary disease) ([place])   . Diabetes ([place])   . Diabetes mellitus without complication ([place]) 4270  . Diastolic CHF, acute on chronic (HCC)   . H/O aortic valve replacement   . Heart disease   . Hypothyroid   . Liver cirrhosis secondary to NASH (nonalcoholic steatohepatitis) ([place])   . Morbid obesity ([place])   . 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 OSA (obstructive sleep apnea)     SURGICAL HISTORY: Past Surgical History:  Procedure Laterality Date  . aortic valve relaced   2008  . BACK SURGERY     AS CHILD  . BREAST BIOPSY Left 04/24/2011   left breast bx neg done by dr byrnett  . BREAST SURGERY  September 25, 2011   intraductal papilloma of the left breast  . CHOLECYSTECTOMY  2007  . COLONOSCOPY WITH PROPOFOL N/A 08/04/2014   Procedure: COLONOSCOPY WITH PROPOFOL;  Surgeon: Manya Silvas,  MD;  Location: [facility] ENDOSCOPY;  Service: Endoscopy;  Laterality: N/A;  . ESOPHAGOGASTRODUODENOSCOPY Left 03/03/2015   Procedure: ESOPHAGOGASTRODUODENOSCOPY (EGD);  Surgeon: Manya Silvas, MD;  Location: [facility] ENDOSCOPY;  Service: Endoscopy;  Laterality: Left;  . ESOPHAGOGASTRODUODENOSCOPY (EGD) WITH PROPOFOL  08/04/2014   Procedure: ESOPHAGOGASTRODUODENOSCOPY (EGD) WITH PROPOFOL;  Surgeon: Manya Silvas, MD;  Location: [facility]OSCOPY;  Service: Endoscopy;;  . ESOPHAGOGASTRODUODENOSCOPY (EGD) WITH PROPOFOL N/A 04/09/2016   Procedure: ESOPHAGOGASTRODUODENOSCOPY (EGD) WITH PROPOFOL;  Surgeon: Lucilla Lame, MD;  Location: [facility] ENDOSCOPY;  Service: Endoscopy;  Laterality: N/A;  . PHRENIC NERVE PACEMAKER IMPLANTATION  2009  . TRACHEOSTOMY TUBE PLACEMENT N/A 02/04/2014   Procedure: TRACHEOSTOMY;  Surgeon: Rozetta Nunnery, MD;  Location: [facility];  Service: ENT;  Laterality: N/A;    SOCIAL HISTORY: Social History   Socioeconomic History  . Marital status: Widowed    Spouse name: Not on file  . Number of children: Not on file  . Years of education: Not on file  . Highest education level: Not on file  Occupational History  . Not on file  Social Needs  . Financial resource strain: Not on file  . Food insecurity:    Worry: Not on file    Inability: Not on file  . Transportation needs:    Medical: Not on file    Non-medical: Not on file  Tobacco Use  . Smoking status: Former Smoker    Last  attempt to quit: 07/28/2008    Years since quitting: 9.1  . Smokeless tobacco: Never Used  Substance and Sexual Activity  . Alcohol use: No  . Drug use: No  . Sexual activity: Not on file  Lifestyle  . Physical activity:    Days per week: Not on file    Minutes per session: Not on file  . Stress: Not on file  Relationships  . Social connections:    Talks on phone: Not on file    Gets together: Not on file    Attends religious service: Not on file    Active member of club or organization: Not on  file    Attends meetings of clubs or organizations: Not on file    Relationship status: Not on file  . Intimate partner violence:    Fear of current or ex partner: Not on file    Emotionally abused: Not on file    Physically abused: Not on file    Forced sexual activity: Not on file  Other Topics Concern  . Not on file  Social History Narrative  . Not on file    FAMILY HISTORY: Family History  Problem Relation Age of Onset  . Skin telangiectasia Mother   . Paget's disease of bone Mother   . Cancer Mother        skin  . Breast cancer Maternal Grandmother   . Cancer Maternal Grandmother        ovarian, pancreatic  . Alzheimer's disease Father   . Atrial fibrillation Father   . Lung cancer Maternal Grandfather     ALLERGIES:  is allergic to furosemide; mold extract [trichophyton]; tetanus toxoids; qvar [beclomethasone]; toprol xl [metoprolol tartrate]; hctz [hydrochlorothiazide]; nickel; nitroglycerin; and other.  MEDICATIONS:  Current Outpatient Medications  Medication Sig Dispense Refill  . acetaminophen (TYLENOL) 500 MG tablet Take 1,000 mg by mouth every 6 (six) hours as needed for mild pain or headache.    . albuterol (PROVENTIL HFA;VENTOLIN HFA) 108 (90 BASE) MCG/ACT inhaler Inhale 2 puffs into the lungs every 6 (six) hours as needed for wheezing or shortness of breath.     Marland Kitchen atorvastatin (LIPITOR) 10 MG tablet Take 1 tablet (10 mg total) by mouth at bedtime. 30 tablet 1  . Calcium Carbonate-Vitamin D (CALCIUM 600+D) 600-400 MG-UNIT tablet Take 1 tablet by mouth at bedtime.    . cholecalciferol (VITAMIN D) 1000 units tablet Take 1,000 Units by mouth daily.    . citalopram (CELEXA) 40 MG tablet Take 40 mg by mouth daily.    . fluticasone (FLONASE) 50 MCG/ACT nasal spray Place 2 sprays into both nostrils daily as needed for rhinitis.     Marland Kitchen gabapentin (NEURONTIN) 600 MG tablet Take 600 mg by mouth 4 (four) times daily.     Marland Kitchen ipratropium-albuterol (DUONEB) 0.5-2.5 (3) MG/3ML  SOLN Take 3 mLs by nebulization 3 (three) times daily. 360 mL 0  . levothyroxine (SYNTHROID, LEVOTHROID) 125 MCG tablet Take 100 mcg by mouth daily before breakfast.     . loratadine (CLARITIN) 10 MG tablet Take 10 mg by mouth daily.    . metFORMIN (GLUCOPHAGE-XR) 500 MG 24 hr tablet Take 500 mg by mouth 2 (two) times daily.     . montelukast (SINGULAIR) 10 MG tablet Take 10 mg by mouth at bedtime.    . sucralfate (CARAFATE) 1 g tablet Take 1 tablet (1 g total) by mouth 4 (four) times daily -  with meals and at bedtime. 90 tablet 6  .  torsemide (DEMADEX) 20 MG tablet Take 1 tablet by mouth daily.    . traZODone (DESYREL) 50 MG tablet Take 50 mg by mouth at bedtime.     . Vitamin D, Ergocalciferol, (DRISDOL) 50000 units CAPS capsule Take 50,000 Units by mouth every 7 (seven) days. Fridays    . XTAMPZA ER 9 MG C12A Take 1 capsule by mouth 3 (three) times daily.    Marland Kitchen EPINEPHrine (EPIPEN 2-PAK) 0.3 mg/0.3 mL IJ SOAJ injection Inject 0.3 mg into the muscle once as needed (for severe allergic reaction).     No current facility-administered medications for this visit.       Marland Kitchen  PHYSICAL EXAMINATION:   Vitals:   08/29/17 1506  BP: 128/82  Pulse: 66  Resp: (!) 22  Temp: 97.9 F (36.6 C)   Filed Weights   08/29/17 1506  Weight: (!) 334 lb 8 oz (151.7 kg)    GENERAL: Well-nourished well-developed; Alert, no distress and comfortable. Obese in a wheel chair.   EYES: positive for pallor OROPHARYNX: no thrush or ulceration. NECK: supple, no masses felt LYMPH:  no palpable lymphadenopathy in the cervical, axillary or inguinal regions LUNGS: clear to auscultation and  No wheeze or crackles HEART/CVS: regular rate & rhythm and no murmurs; 1 plus bil lower extremity edema ABDOMEN: abdomen soft, non-tender and normal bowel sounds Musculoskeletal:no cyanosis of digits and no clubbing  PSYCH: alert & oriented x 3 with fluent speech NEURO: no focal motor/sensory deficits SKIN:  no rashes or  significant lesions.   LABORATORY DATA:  I have reviewed the data as listed Lab Results  Component Value Date   WBC 5.3 07/28/2017   HGB 14.1 07/28/2017   HCT 41.2 07/28/2017   MCV 91.0 07/28/2017   PLT 105 (L) 07/28/2017   Recent Labs    09/24/16 1831  05/04/17 1145 06/26/17 0859 06/27/17 0556 06/29/17 0427  NA 139   < > 139 138 134* 139  K 4.0   < > 3.9 4.0 4.1 4.4  CL 101   < > 102 99* 99* 100*  CO2 31   < > 29 30 25  33*  GLUCOSE 117*   < > 186* 213* 357* 190*  BUN 18   < > 22* 19 27* 30*  CREATININE 0.97   < > 0.90 1.27* 1.18* 0.99  CALCIUM 9.0   < > 9.3 8.4* 8.4* 9.8  GFRNONAA >60   < > >60 45* 49* >60  GFRAA >60   < > >60 52* 57* >60  PROT 7.0  --  7.0 7.1  --   --   ALBUMIN 3.6  --  3.8 3.8  --   --   AST 32  --  24 20  --   --   ALT 30  --  16 14  --   --   ALKPHOS 64  --  61 72  --   --   BILITOT 2.0*  --  2.3* 1.9*  --   --   BILIDIR  --   --  0.3  --   --   --   IBILI  --   --  2.0*  --   --   --    < > = values in this interval not displayed.     ASSESSMENT & PLAN:   Iron deficiency anemia due to chronic blood loss #  IRON DEFICIENCY ANEMIA- Likely GI etiology [status post GI workup as above; no clear source].  Today  hemoglobin 14.  Stable.  No IV iron.  #Chronic mild thrombocytopenia-today 105.  Stable.  ITP versus others [no splenomegaly and a CT scan 2017.]  #Cirrhosis-question etiology/fatty liver; stable.  #Chronic fatigue-unclear etiology.  Defer to PCP.  # Recheck CBC ferritin and iron studies in 6 months- few days prior/possible ferriheme. Follow-up in 6 months.     Cammie Sickle, MD 09/22/2017 10:38 PM

## 2018-02-24 ENCOUNTER — Other Ambulatory Visit: Payer: Self-pay | Admitting: Family

## 2018-02-24 DIAGNOSIS — Z1231 Encounter for screening mammogram for malignant neoplasm of breast: Secondary | ICD-10-CM

## 2018-02-27 ENCOUNTER — Inpatient Hospital Stay: Payer: Medicare HMO

## 2018-02-27 ENCOUNTER — Inpatient Hospital Stay: Payer: Medicare HMO | Attending: Internal Medicine | Admitting: Internal Medicine

## 2018-02-27 NOTE — Progress Notes (Unsigned)
[place] NOTE  Patient Care Team: Perrin Maltese, MD as PCP - General (Internal Medicine) Robert Bellow, MD (General Surgery)  CHIEF COMPLAINTS/PURPOSE OF CONSULTATION:   # April 2017- IRON DEFICIENCY ANEMIA sec to chronic GI blood loss; [Hb-7/Feb 2017] status post IV on.  # Chronic GI blood loss- EDG/capsule [feb 2017]- gastrtitis ? NSAIDs/COLO-July 2016/ [Dr.Elliot]  # Bovine heart valve [not on anti-coagulation]  HISTORY OF PRESENTING ILLNESS:  Theresa Leon 63 y.o.  female morbidly obese multiple medical problems history of iron deficiency anemia likely GI blood loss/unclear source is here for follow-up.  Patient complains of ongoing fatigue.  Otherwise denies any blood in stools black or stools.  Not has needed and in the last 1 to 2 years.  ROS: A complete 10 point review of system is done which is negative except mentioned above in history of present illness  MEDICAL HISTORY:  Past Medical History:  Diagnosis Date  . Acute on chronic renal failure ([place])   . Anemia   . Asthma 1975  . COPD (chronic obstructive pulmonary disease) ([place])   . Diabetes ([place])   . Diabetes mellitus without complication ([place]) 1245  . Diastolic CHF, acute on chronic (HCC)   . H/O aortic valve replacement   . Heart disease   . Hypothyroid   . Liver cirrhosis secondary to NASH (nonalcoholic steatohepatitis) ([place])   . Morbid obesity ([place])   . 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 OSA (obstructive sleep apnea)     SURGICAL HISTORY: Past Surgical History:  Procedure Laterality Date  . aortic valve relaced   2008  . BACK SURGERY     AS CHILD  . BREAST BIOPSY Left 04/24/2011   left breast bx neg done by dr byrnett  . BREAST SURGERY  September 25, 2011   intraductal papilloma of the left breast  . CHOLECYSTECTOMY  2007  . COLONOSCOPY WITH PROPOFOL N/A 08/04/2014   Procedure: COLONOSCOPY WITH PROPOFOL;  Surgeon: Manya Silvas,  MD;  Location: [facility] ENDOSCOPY;  Service: Endoscopy;  Laterality: N/A;  . ESOPHAGOGASTRODUODENOSCOPY Left 03/03/2015   Procedure: ESOPHAGOGASTRODUODENOSCOPY (EGD);  Surgeon: Manya Silvas, MD;  Location: [facility] ENDOSCOPY;  Service: Endoscopy;  Laterality: Left;  . ESOPHAGOGASTRODUODENOSCOPY (EGD) WITH PROPOFOL  08/04/2014   Procedure: ESOPHAGOGASTRODUODENOSCOPY (EGD) WITH PROPOFOL;  Surgeon: Manya Silvas, MD;  Location: [facility] ENDOSCOPY;  Service: Endoscopy;;  . ESOPHAGOGASTRODUODENOSCOPY (EGD) WITH PROPOFOL N/A 04/09/2016   Procedure: ESOPHAGOGASTRODUODENOSCOPY (EGD) WITH PROPOFOL;  Surgeon: Lucilla Lame, MD;  Location: [facility] ENDOSCOPY;  Service: Endoscopy;  Laterality: N/A;  . PHRENIC NERVE PACEMAKER IMPLANTATION  2009  . TRACHEOSTOMY TUBE PLACEMENT N/A 02/04/2014   Procedure: TRACHEOSTOMY;  Surgeon: Rozetta Nunnery, MD;  Location: [place];  Service: ENT;  Laterality: N/A;    SOCIAL HISTORY: Social History   Socioeconomic History  . Marital status: Widowed    Spouse name: Not on file  . Number of children: Not on file  . Years of education: Not on file  . Highest education level: Not on file  Occupational History  . Not on file  Social Needs  . Financial resource strain: Not on file  . Food insecurity:    Worry: Not on file    Inability: Not on file  . Transportation needs:    Medical: Not on file    Non-medical: Not on file  Tobacco Use  . Smoking status: Former Smoker    Last  attempt to quit: 07/28/2008    Years since quitting: 9.5  . Smokeless tobacco: Never Used  Substance and Sexual Activity  . Alcohol use: No  . Drug use: No  . Sexual activity: Not on file  Lifestyle  . Physical activity:    Days per week: Not on file    Minutes per session: Not on file  . Stress: Not on file  Relationships  . Social connections:    Talks on phone: Not on file    Gets together: Not on file    Attends religious service: Not on file    Active member of club or organization: Not on  file    Attends meetings of clubs or organizations: Not on file    Relationship status: Not on file  . Intimate partner violence:    Fear of current or ex partner: Not on file    Emotionally abused: Not on file    Physically abused: Not on file    Forced sexual activity: Not on file  Other Topics Concern  . Not on file  Social History Narrative  . Not on file    FAMILY HISTORY: Family History  Problem Relation Age of Onset  . Skin telangiectasia Mother   . Paget's disease of bone Mother   . Cancer Mother        skin  . Breast cancer Maternal Grandmother   . Cancer Maternal Grandmother        ovarian, pancreatic  . Alzheimer's disease Father   . Atrial fibrillation Father   . Lung cancer Maternal Grandfather     ALLERGIES:  is allergic to furosemide; mold extract [trichophyton]; tetanus toxoids; qvar [beclomethasone]; toprol xl [metoprolol tartrate]; hctz [hydrochlorothiazide]; nickel; nitroglycerin; and other.  MEDICATIONS:  Current Outpatient Medications  Medication Sig Dispense Refill  . acetaminophen (TYLENOL) 500 MG tablet Take 1,000 mg by mouth every 6 (six) hours as needed for mild pain or headache.    . albuterol (PROVENTIL HFA;VENTOLIN HFA) 108 (90 BASE) MCG/ACT inhaler Inhale 2 puffs into the lungs every 6 (six) hours as needed for wheezing or shortness of breath.     Marland Kitchen atorvastatin (LIPITOR) 10 MG tablet Take 1 tablet (10 mg total) by mouth at bedtime. 30 tablet 1  . Calcium Carbonate-Vitamin D (CALCIUM 600+D) 600-400 MG-UNIT tablet Take 1 tablet by mouth at bedtime.    . cholecalciferol (VITAMIN D) 1000 units tablet Take 1,000 Units by mouth daily.    . citalopram (CELEXA) 40 MG tablet Take 40 mg by mouth daily.    Marland Kitchen EPINEPHrine (EPIPEN 2-PAK) 0.3 mg/0.3 mL IJ SOAJ injection Inject 0.3 mg into the muscle once as needed (for severe allergic reaction).    . fluticasone (FLONASE) 50 MCG/ACT nasal spray Place 2 sprays into both nostrils daily as needed for rhinitis.      Marland Kitchen gabapentin (NEURONTIN) 600 MG tablet Take 600 mg by mouth 4 (four) times daily.     Marland Kitchen ipratropium-albuterol (DUONEB) 0.5-2.5 (3) MG/3ML SOLN Take 3 mLs by nebulization 3 (three) times daily. 360 mL 0  . levothyroxine (SYNTHROID, LEVOTHROID) 125 MCG tablet Take 100 mcg by mouth daily before breakfast.     . loratadine (CLARITIN) 10 MG tablet Take 10 mg by mouth daily.    . metFORMIN (GLUCOPHAGE-XR) 500 MG 24 hr tablet Take 500 mg by mouth 2 (two) times daily.     . montelukast (SINGULAIR) 10 MG tablet Take 10 mg by mouth at bedtime.    . sucralfate (CARAFATE)  1 g tablet Take 1 tablet (1 g total) by mouth 4 (four) times daily -  with meals and at bedtime. 90 tablet 6  . torsemide (DEMADEX) 20 MG tablet Take 1 tablet by mouth daily.    . traZODone (DESYREL) 50 MG tablet Take 50 mg by mouth at bedtime.     . Vitamin D, Ergocalciferol, (DRISDOL) 50000 units CAPS capsule Take 50,000 Units by mouth every 7 (seven) days. Fridays    . XTAMPZA ER 9 MG C12A Take 1 capsule by mouth 3 (three) times daily.     No current facility-administered medications for this visit.       Marland Kitchen  PHYSICAL EXAMINATION:   There were no vitals filed for this visit. There were no vitals filed for this visit.  GENERAL: Well-nourished well-developed; Alert, no distress and comfortable. Obese in a wheel chair.   EYES: positive for pallor OROPHARYNX: no thrush or ulceration. NECK: supple, no masses felt LYMPH:  no palpable lymphadenopathy in the cervical, axillary or inguinal regions LUNGS: clear to auscultation and  No wheeze or crackles HEART/CVS: regular rate & rhythm and no murmurs; 1 plus bil lower extremity edema ABDOMEN: abdomen soft, non-tender and normal bowel sounds Musculoskeletal:no cyanosis of digits and no clubbing  PSYCH: alert & oriented x 3 with fluent speech NEURO: no focal motor/sensory deficits SKIN:  no rashes or significant lesions.   LABORATORY DATA:  I have reviewed the data as listed Lab  Results  Component Value Date   WBC 5.3 07/28/2017   HGB 14.1 07/28/2017   HCT 41.2 07/28/2017   MCV 91.0 07/28/2017   PLT 105 (L) 07/28/2017   Recent Labs    05/04/17 1145 06/26/17 0859 06/27/17 0556 06/29/17 0427  NA 139 138 134* 139  K 3.9 4.0 4.1 4.4  CL 102 99* 99* 100*  CO2 29 30 25  33*  GLUCOSE 186* 213* 357* 190*  BUN 22* 19 27* 30*  CREATININE 0.90 1.27* 1.18* 0.99  CALCIUM 9.3 8.4* 8.4* 9.8  GFRNONAA >60 45* 49* >60  GFRAA >60 52* 57* >60  PROT 7.0 7.1  --   --   ALBUMIN 3.8 3.8  --   --   AST 24 20  --   --   ALT 16 14  --   --   ALKPHOS 61 72  --   --   BILITOT 2.3* 1.9*  --   --   BILIDIR 0.3  --   --   --   IBILI 2.0*  --   --   --      ASSESSMENT & PLAN:   No problem-specific Assessment & Plan notes found for this encounter.     Cammie Sickle, MD 02/27/2018 8:12 AM

## 2018-02-27 NOTE — Assessment & Plan Note (Signed)
#    IRON DEFICIENCY ANEMIA- Likely GI etiology [status post GI workup as above; no clear source].  Today hemoglobin 14.  Stable.  No IV iron.  #Chronic mild thrombocytopenia-today 105.  Stable.  ITP versus others [no splenomegaly and a CT scan 2017.]  #Cirrhosis-question etiology/fatty liver; stable.  #Chronic fatigue-unclear etiology.  Defer to PCP.  # Recheck CBC ferritin and iron studies in 6 months- few days prior/possible ferriheme. Follow-up in 6 months.

## 2018-06-17 ENCOUNTER — Other Ambulatory Visit: Payer: Self-pay

## 2018-06-17 ENCOUNTER — Encounter: Payer: Self-pay | Admitting: Emergency Medicine

## 2018-06-17 DIAGNOSIS — N3000 Acute cystitis without hematuria: Secondary | ICD-10-CM | POA: Diagnosis not present

## 2018-06-17 DIAGNOSIS — E119 Type 2 diabetes mellitus without complications: Secondary | ICD-10-CM | POA: Insufficient documentation

## 2018-06-17 DIAGNOSIS — Z79899 Other long term (current) drug therapy: Secondary | ICD-10-CM | POA: Insufficient documentation

## 2018-06-17 DIAGNOSIS — I5032 Chronic diastolic (congestive) heart failure: Secondary | ICD-10-CM | POA: Diagnosis not present

## 2018-06-17 DIAGNOSIS — I110 Hypertensive heart disease with heart failure: Secondary | ICD-10-CM | POA: Insufficient documentation

## 2018-06-17 DIAGNOSIS — Z87891 Personal history of nicotine dependence: Secondary | ICD-10-CM | POA: Insufficient documentation

## 2018-06-17 DIAGNOSIS — E1165 Type 2 diabetes mellitus with hyperglycemia: Secondary | ICD-10-CM | POA: Insufficient documentation

## 2018-06-17 LAB — CBC
HCT: 43.1 % (ref 36.0–46.0)
Hemoglobin: 14.8 g/dL (ref 12.0–15.0)
MCH: 31 pg (ref 26.0–34.0)
MCHC: 34.3 g/dL (ref 30.0–36.0)
MCV: 90.4 fL (ref 80.0–100.0)
Platelets: 92 10*3/uL — ABNORMAL LOW (ref 150–400)
RBC: 4.77 MIL/uL (ref 3.87–5.11)
RDW: 13 % (ref 11.5–15.5)
WBC: 6.7 10*3/uL (ref 4.0–10.5)
nRBC: 0 % (ref 0.0–0.2)

## 2018-06-17 LAB — URINALYSIS, COMPLETE (UACMP) WITH MICROSCOPIC
Bacteria, UA: NONE SEEN
Bilirubin Urine: NEGATIVE
Glucose, UA: NEGATIVE mg/dL
Hgb urine dipstick: NEGATIVE
Ketones, ur: NEGATIVE mg/dL
Nitrite: NEGATIVE
Protein, ur: 30 mg/dL — AB
Specific Gravity, Urine: 1.024 (ref 1.005–1.030)
WBC, UA: >50 WBC/hpf — ABNORMAL HIGH (ref 0–5)
pH: 5 (ref 5.0–8.0)

## 2018-06-17 LAB — BASIC METABOLIC PANEL
Anion gap: 9 (ref 5–15)
BUN: 16 mg/dL (ref 8–23)
CO2: 32 mmol/L (ref 22–32)
Calcium: 9.5 mg/dL (ref 8.9–10.3)
Chloride: 101 mmol/L (ref 98–111)
Creatinine, Ser: 0.94 mg/dL (ref 0.44–1.00)
GFR calc Af Amer: >60 mL/min (ref >60)
GFR calc non Af Amer: >60 mL/min (ref >60)
Glucose, Bld: 134 mg/dL — ABNORMAL HIGH (ref 70–99)
Potassium: 3.9 mmol/L (ref 3.5–5.1)
Sodium: 142 mmol/L (ref 135–145)

## 2018-06-17 LAB — GLUCOSE, CAPILLARY: Glucose-Capillary: 133 mg/dL — ABNORMAL HIGH (ref 70–99)

## 2018-06-17 NOTE — ED Triage Notes (Signed)
Patient presents to the ED with hyperglycemia.  Patient has been out of her metformin x 3 weeks.  Patient reports nausea, blurry vision and weakness.  Patient states her blood sugar was reading "HIGH today"  Patient took two separate doses of 10 units of humalog from a friend today without improvement in blood sugar.

## 2018-06-18 ENCOUNTER — Emergency Department
Admission: EM | Admit: 2018-06-18 | Discharge: 2018-06-18 | Disposition: A | Discharge status: Home / Self Care | Payer: Medicare Other | Attending: Emergency Medicine | Admitting: Emergency Medicine

## 2018-06-18 DIAGNOSIS — R739 Hyperglycemia, unspecified: Secondary | ICD-10-CM

## 2018-06-18 DIAGNOSIS — N3000 Acute cystitis without hematuria: Secondary | ICD-10-CM

## 2018-06-18 LAB — GLUCOSE, CAPILLARY: Glucose-Capillary: 163 mg/dL — ABNORMAL HIGH (ref 70–99)

## 2018-06-18 MED ORDER — CEPHALEXIN 500 MG PO CAPS: 500.0000 mg | ORAL_CAPSULE | Freq: Two times a day (BID) | ORAL | 0 refills | Status: AC

## 2018-06-18 NOTE — ED Provider Notes (Signed)
[facility] Emergency Department Provider Note    First MD Initiated Contact with Patient 06/18/18 0136     (approximate)  I have reviewed the triage vital signs and the nursing notes.   HISTORY  Chief Complaint Hyperglycemia   HPI Theresa Leon is a 63 y.o. female with below list of previous medical conditions including type 2 diabetes presents to the emergency department secondary to hyperglycemia.  Patient states that her glucometer at home read "high".  Patient patient states that she has been without her metformin for approximately 3 weeks.  Patient does admit to nausea generalized weakness.   Patient did take 10 units of Humalog which he obtained from a friend today.        Past Medical History:  Diagnosis Date  . Acute on chronic renal failure ([place])   . Anemia   . Asthma 1975  . COPD (chronic obstructive pulmonary disease) ([place])   . Diabetes ([place])   . Diabetes mellitus without complication ([place]) 9528  . Diastolic CHF, acute on chronic (HCC)   . H/O aortic valve replacement   . Heart disease   . Hypothyroid   . Liver cirrhosis secondary to NASH (nonalcoholic steatohepatitis) ([place])   . Morbid obesity ([place])   . 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 OSA (obstructive sleep apnea)     Patient Active Problem List   Diagnosis Date Noted  . Chest pain 03/05/2017  . V tach ([place]) 09/18/2015  . Iron deficiency anemia due to chronic blood loss 05/03/2015  . Gastritis due to nonsteroidal anti-inflammatory drug (NSAID) 03/05/2015  . History of esophagogastroduodenoscopy (EGD) 03/05/2015  . Type 2 diabetes mellitus ([place]) 03/01/2015  . GERD (gastroesophageal reflux disease) 03/01/2015  . HLD (hyperlipidemia) 03/01/2015  . COPD (chronic obstructive pulmonary disease) ([place]) 03/01/2015  . Chronic diastolic CHF (congestive heart failure) ([place]) 03/01/2015  . Hypothyroidism 03/01/2015  . OSA on CPAP 03/01/2015  .  Acute posthemorrhagic anemia 03/01/2015  . Dysphagia   . CHF (congestive heart failure) ([place])   . Acute respiratory failure with hypoxia ([place]) 01/26/2014  . Encounter for imaging study to confirm orogastric (OG) tube placement   . Respiratory failure ([place])   . 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 aortic valve relaced   2008  . BACK SURGERY     AS CHILD  . BREAST BIOPSY Left 04/24/2011   left breast bx neg done by dr byrnett  . BREAST SURGERY  September 25, 2011   intraductal papilloma of the left breast  . CHOLECYSTECTOMY  2007  . COLONOSCOPY WITH PROPOFOL N/A 08/04/2014   Procedure: COLONOSCOPY WITH PROPOFOL;  Surgeon: Manya Silvas, MD;  Location[facility] ENDOSCOPY;  Service: Endoscopy;  Laterality: N/A;  . ESOPHAGOGASTRODUODENOSCOPY Left 03/03/2015   Procedure: ESOPHAGOGASTRODUODENOSCOPY (EGD);  Surgeon: Manya Silvas, MD;  Location[facility] ENDOSCOPY;  Service: Endoscopy;  Laterality: Left;  . ESOPHAGOGASTRODUODENOSCOPY (EGD) WITH PROPOFOL  08/04/2014   Procedure: ESOPHAGOGASTRODUODENOSCOPY (EGD) WITH PROPOFOL;  Surgeon: Manya Silvas, MD;  Location: [facility] ENDOSCOPY;  Service: Endoscopy;;  . ESOPHAGOGASTRODUODENOSCOPY (EGD) WITH PROPOFOL N/A 04/09/2016   Procedure: ESOPHAGOGASTRODUODENOSCOPY (EGD) WITH PROPOFOL;  Surgeon: Lucilla Lame, MD;  Location: [facility] ENDOSCOPY;  Service: Endoscopy;  Laterality: N/A;  . PHRENIC NERVE PACEMAKER IMPLANTATION  2009  . TRACHEOSTOMY TUBE PLACEMENT N/A 02/04/2014   Procedure: TRACHEOSTOMY;  Surgeon: Rozetta Nunnery, MD;  Location: [facility]  OR;  Service: ENT;  Laterality: N/A;    Prior to Admission medications   Medication Sig Start Date End Date Taking? Authorizing Provider  acetaminophen (TYLENOL) 500 MG tablet Take 1,000 mg by mouth every 6 (six) hours as needed for mild pain or headache.    [provider]  albuterol (PROVENTIL HFA;VENTOLIN HFA) 108 (90 BASE) MCG/ACT inhaler  Inhale 2 puffs into the lungs every 6 (six) hours as needed for wheezing or shortness of breath.     [provider]  atorvastatin (LIPITOR) 10 MG tablet Take 1 tablet (10 mg total) by mouth at bedtime. 06/29/17   Gladstone Lighter, MD  Calcium Carbonate-Vitamin D (CALCIUM 600+D) 600-400 MG-UNIT tablet Take 1 tablet by mouth at bedtime.    [provider]  cholecalciferol (VITAMIN D) 1000 units tablet Take 1,000 Units by mouth daily.    [provider]  citalopram (CELEXA) 40 MG tablet Take 40 mg by mouth daily.    [provider]  EPINEPHrine (EPIPEN 2-PAK) 0.3 mg/0.3 mL IJ SOAJ injection Inject 0.3 mg into the muscle once as needed (for severe allergic reaction).    [provider]  fluticasone (FLONASE) 50 MCG/ACT nasal spray Place 2 sprays into both nostrils daily as needed for rhinitis.     [provider]  gabapentin (NEURONTIN) 600 MG tablet Take 600 mg by mouth 4 (four) times daily.     [provider]  ipratropium-albuterol (DUONEB) 0.5-2.5 (3) MG/3ML SOLN Take 3 mLs by nebulization 3 (three) times daily. 06/29/17   Gladstone Lighter, MD  levothyroxine (SYNTHROID, LEVOTHROID) 125 MCG tablet Take 100 mcg by mouth daily before breakfast.     [provider]  loratadine (CLARITIN) 10 MG tablet Take 10 mg by mouth daily.    [provider]  metFORMIN (GLUCOPHAGE-XR) 500 MG 24 hr tablet Take 500 mg by mouth 2 (two) times daily.     [provider]  montelukast (SINGULAIR) 10 MG tablet Take 10 mg by mouth at bedtime.    [provider]  sucralfate (CARAFATE) 1 g tablet Take 1 tablet (1 g total) by mouth 4 (four) times daily -  with meals and at bedtime. 03/05/15   Theodoro Grist, MD  torsemide (DEMADEX) 20 MG tablet Take 1 tablet by mouth daily. 06/03/17   [provider]  traZODone (DESYREL) 50 MG tablet Take 50 mg by mouth at bedtime.     [provider]  Vitamin D, Ergocalciferol,  (DRISDOL) 50000 units CAPS capsule Take 50,000 Units by mouth every 7 (seven) days. Fridays    [provider]  XTAMPZA ER 9 MG C12A Take 1 capsule by mouth 3 (three) times daily. 06/11/17   [provider]    Allergies Furosemide; Mold extract [trichophyton]; Tetanus toxoids; Qvar [beclomethasone]; Toprol xl [metoprolol tartrate]; Hctz [hydrochlorothiazide]; Nickel; Nitroglycerin; and Other  Family History  Problem Relation Age of Onset  . Skin telangiectasia Mother   . Paget's disease of bone Mother   . Cancer Mother        skin  . Breast cancer Maternal Grandmother   . Cancer Maternal Grandmother        ovarian, pancreatic  . Alzheimer's disease Father   . Atrial fibrillation Father   . Lung cancer Maternal Grandfather     Social History Social History   Tobacco Use  . Smoking status: Former Smoker    Last attempt to quit: 07/28/2008    Years since quitting: 9.8  . Smokeless tobacco:  Never Used  Substance Use Topics  . Alcohol use: No  . Drug use: No    Review of Systems Constitutional: No fever/chills Eyes: No visual changes. ENT: No sore throat. Cardiovascular: Denies chest pain. Respiratory: Denies shortness of breath. Gastrointestinal: No abdominal pain.  No nausea, no vomiting.  No diarrhea.  No constipation. Genitourinary: Negative for dysuria. Musculoskeletal: Negative for neck pain.  Negative for back pain. Integumentary: Negative for rash. Neurological: Negative for headaches, focal weakness or numbness.   ____________________________________________   PHYSICAL EXAM:  VITAL SIGNS: ED Triage Vitals  Enc Vitals Group     BP 06/17/18 2153 120/61     Pulse Rate 06/17/18 2153 66     Resp 06/17/18 2153 (!) 22     Temp 06/17/18 2153 98.3 F (36.8 C)     Temp Source 06/17/18 2153 Oral     SpO2 06/17/18 2153 96 %     Weight 06/17/18 2155 (!) 152 kg (335 lb)     Height 06/17/18 2155 1.715 m (5' 7.5")     Head Circumference --      Peak  Flow --      Pain Score 06/17/18 2158 0     Pain Loc --      Pain Edu? --      Excl. in [place]? --     Constitutional: Alert and oriented. Well appearing and in no acute distress. Eyes: Conjunctivae are normal. PERRL. EOMI. Head: Atraumatic. Mouth/Throat: Mucous membranes are moist.  Oropharynx non-erythematous. Neck: No stridor.  Cardiovascular: Normal rate, regular rhythm. Good peripheral circulation. Grossly normal heart sounds. Respiratory: Normal respiratory effort.  No retractions. No audible wheezing. Gastrointestinal: Soft and nontender. No distention.  Musculoskeletal: No lower extremity tenderness nor edema. No gross deformities of extremities. Neurologic:  Normal speech and language. No gross focal neurologic deficits are appreciated.  Skin:  Skin is warm, dry and intact. No rash noted. Psychiatric: Mood and affect are normal. Speech and behavior are normal.  ____________________________________________   LABS (all labs ordered are listed, but only abnormal results are displayed)  Labs Reviewed  BASIC METABOLIC PANEL - Abnormal; Notable for the following components:      Result Value   Glucose, Bld 134 (*)    All other components within normal limits  CBC - Abnormal; Notable for the following components:   Platelets 92 (*)    All other components within normal limits  URINALYSIS, COMPLETE (UACMP) WITH MICROSCOPIC - Abnormal; Notable for the following components:   Color, Urine YELLOW (*)    APPearance CLOUDY (*)    Protein, ur 30 (*)    Leukocytes,Ua LARGE (*)    WBC, UA >50 (*)    Non Squamous Epithelial PRESENT (*)    All other components within normal limits  GLUCOSE, CAPILLARY - Abnormal; Notable for the following components:   Glucose-Capillary 133 (*)    All other components within normal limits  URINE CULTURE  CBG MONITORING, ED     Procedures   ____________________________________________   INITIAL IMPRESSION / MDM / [place] / ED  COURSE  As part of my medical decision making, I reviewed the following data within the electronic MEDICAL RECORD NUMBER   63 year old female presenting with above-stated history and physical exam secondary to hyperglycemia at home.  Patient's current glucose 163.  Patient does have metformin at home now and as such does not require additional prescription.  Patient noted to have findings concerning for urinary tract infection.  Urine culture  pending.  Patient will be prescribed Keflex.  *ILEY DEIGNAN was evaluated in Emergency Department on 06/18/2018 for the symptoms described in the history of present illness. She was evaluated in the context of the global COVID-19 pandemic, which necessitated consideration that the patient might be at risk for infection with the SARS-CoV-2 virus that causes COVID-19. Institutional protocols and algorithms that pertain to the evaluation of patients at risk for COVID-19 are in a state of rapid change based on information released by regulatory bodies including the CDC and federal and state organizations. These policies and algorithms were followed during the patient's care in the ED.  Some ED evaluations and interventions may be delayed as a result of limited staffing during the pandemic.*   ____________________________________________  FINAL CLINICAL IMPRESSION(S) / ED DIAGNOSES  Final diagnoses:  Acute cystitis without hematuria  Hyperglycemia     MEDICATIONS GIVEN DURING THIS VISIT:  Medications - No data to display   ED Discharge Orders    None       Note:  This document was prepared using Dragon voice recognition software and may include unintentional dictation errors.   Gregor Hams, MD 06/18/18 [phone]

## 2018-06-18 NOTE — ED Notes (Signed)
Patient states CBG was reading high and was having symptoms of hyperglycemia of dizziness lightheaded.  Patent states she has been out of metformin possibly since April per patient stating the doctor sent wrong type of meformin to Pharmacy. Per patient pharmacy told her to go ahead and take the metformin HCL so patient took I tablet 5/20 in the AM and at noon along with total of 20 units of novalog.

## 2018-06-19 LAB — URINE CULTURE: Culture: 70000 — AB

## 2018-09-07 ENCOUNTER — Other Ambulatory Visit: Payer: Self-pay

## 2018-09-07 ENCOUNTER — Other Ambulatory Visit: Payer: Self-pay | Admitting: *Deleted

## 2018-09-07 ENCOUNTER — Ambulatory Visit: Payer: Medicare Other | Admitting: Urology

## 2018-09-07 ENCOUNTER — Encounter: Payer: Self-pay | Admitting: Urology

## 2018-09-07 VITALS — BP 161/82 | HR 102 | Ht 67.5 in | Wt 321.5 lb

## 2018-09-07 DIAGNOSIS — R3121 Asymptomatic microscopic hematuria: Secondary | ICD-10-CM

## 2018-09-07 DIAGNOSIS — N200 Calculus of kidney: Secondary | ICD-10-CM | POA: Diagnosis not present

## 2018-09-07 LAB — MICROSCOPIC EXAMINATION: RBC, Urine: >30 /hpf — AB (ref 0–2)

## 2018-09-07 LAB — URINALYSIS, COMPLETE
Bilirubin, UA: NEGATIVE
Nitrite, UA: NEGATIVE
Specific Gravity, UA: 1.02 (ref 1.005–1.030)
Urobilinogen, Ur: 1 mg/dL (ref 0.2–1.0)
pH, UA: 5.5 (ref 5.0–7.5)

## 2018-09-07 NOTE — Patient Instructions (Signed)
Asymptomatic Bacteriuria  Asymptomatic bacteriuria is the presence of a large number of bacteria in the urine without the usual symptoms of burning or frequent urination. What are the causes? This condition is caused by an increase in bacteria in the urine. This increase can be caused by:  Bacteria entering the urinary tract, such as during sex.  A blockage in the urinary tract, such as from kidney stones or a tumor.  Bladder problems that prevent the bladder from emptying. What increases the risk? You are more likely to develop this condition if:  You have diabetes mellitus.  You are an elderly adult, especially if you are also in a long-term care facility.  You are pregnant and in the first trimester.  You have kidney stones.  You are female.  You have had a kidney transplant.  You have a leaky kidney tube valve (reflux).  You had a urinary catheter for a long period of time. What are the signs or symptoms? There are no symptoms of this condition. How is this diagnosed? This condition is diagnosed with a urine test. Because this condition does not cause symptoms, it is usually diagnosed when a urine sample is taken to treat or diagnose another condition, such as pregnancy or kidney problems. Most women who are in their first trimester of pregnancy are screened for asymptomatic bacteriuria. How is this treated? Usually, treatment is not needed for this condition. Treating the condition can lead to other problems, such as a yeast infection or the growth of bacteria that do not respond to treatment (antibiotic-resistant bacteria). Some people, such as pregnant women and people with kidney transplants, do need treatment with antibiotic medicines to prevent kidney infection (pyelonephritis). In pregnant women, kidney infection can lead to premature labor, fetal growth restriction, or newborn death. Follow these instructions at home: Medicines  Take over-the-counter and prescription  medicines only as told by your health care provider.  If you were prescribed an antibiotic medicine, take it as told by your health care provider. Do not stop taking the antibiotic even if you start to feel better. General instructions  Monitor your condition for any changes.  Drink enough fluid to keep your urine clear or pale yellow.  Go to the bathroom more often to keep your bladder empty.  If you are female, keep the area around your vagina and rectum clean. Wipe yourself from front to back after urinating.  Keep all follow-up visits as told by your health care provider. This is important. Contact a health care provider if:  You notice any new symptoms, such as back pain or burning while urinating. Get help right away if:  You develop signs of an infection such as: ? A burning sensation when you urinate. ? Have pain when you urinate. ? Develop an intense need to urinate. ? Urinating more frequently. ? Back pain or pelvic pain. ? Fever or chills.  You have blood in your urine.  Your urine becomes discolored or cloudy.  Your urine smells bad.  You have severe pain that cannot be controlled with medicine. Summary  Asymptomatic bacteriuria is the presence of a large number of bacteria in the urine without the usual symptoms of burning or frequent urination.  Usually, treatment is not needed for this condition. Treating the condition can lead to other problems, such as too much yeast and the growth of antibiotic-resistant bacteria.  Some people, such as pregnant women and people with kidney transplants, do need treatment with antibiotic medicines to prevent kidney  infection (pyelonephritis).  If you were prescribed an antibiotic medicine, take it as told by your health care provider. Do not stop taking the antibiotic even if you start to feel better. This information is not intended to replace advice given to you by your health care provider. Make sure you discuss any  questions you have with your health care provider. Document Released: 01/14/2005 Document Revised: 05/05/2018 Document Reviewed: 01/09/2016 Elsevier Patient Education  [place].   Urinary Tract Infection, Adult A urinary tract infection (UTI) is an infection of any part of the urinary tract. The urinary tract includes:  The kidneys.  The ureters.  The bladder.  The urethra. These organs make, store, and get rid of pee (urine) in the body. What are the causes? This is caused by germs (bacteria) in your genital area. These germs grow and cause swelling (inflammation) of your urinary tract. What increases the risk? You are more likely to develop this condition if:  You have a small, thin tube (catheter) to drain pee.  You cannot control when you pee or poop (incontinence).  You are female, and: ? You use these methods to prevent pregnancy: ? A medicine that kills sperm (spermicide). ? A device that blocks sperm (diaphragm). ? You have low levels of a female hormone (estrogen). ? You are pregnant.  You have genes that add to your risk.  You are sexually active.  You take antibiotic medicines.  You have trouble peeing because of: ? A prostate that is bigger than normal, if you are female. ? A blockage in the part of your body that drains pee from the bladder (urethra). ? A kidney stone. ? A nerve condition that affects your bladder (neurogenic bladder). ? Not getting enough to drink. ? Not peeing often enough.  You have other conditions, such as: ? Diabetes. ? A weak disease-fighting system (immune system). ? Sickle cell disease. ? Gout. ? Injury of the spine. What are the signs or symptoms? Symptoms of this condition include:  Needing to pee right away (urgently).  Peeing often.  Peeing small amounts often.  Pain or burning when peeing.  Blood in the pee.  Pee that smells bad or not like normal.  Trouble peeing.  Pee that is cloudy.  Fluid  coming from the vagina, if you are female.  Pain in the belly or lower back. Other symptoms include:  Throwing up (vomiting).  No urge to eat.  Feeling mixed up (confused).  Being tired and grouchy (irritable).  A fever.  Watery poop (diarrhea). How is this treated? This condition may be treated with:  Antibiotic medicine.  Other medicines.  Drinking enough water. Follow these instructions at home:  Medicines  Take over-the-counter and prescription medicines only as told by your doctor.  If you were prescribed an antibiotic medicine, take it as told by your doctor. Do not stop taking it even if you start to feel better. General instructions  Make sure you: ? Pee until your bladder is empty. ? Do not hold pee for a long time. ? Empty your bladder after sex. ? Wipe from front to back after pooping if you are a female. Use each tissue one time when you wipe.  Drink enough fluid to keep your pee pale yellow.  Keep all follow-up visits as told by your doctor. This is important. Contact a doctor if:  You do not get better after 1-2 days.  Your symptoms go away and then come back. Get help right  away if:  You have very bad back pain.  You have very bad pain in your lower belly.  You have a fever.  You are sick to your stomach (nauseous).  You are throwing up. Summary  A urinary tract infection (UTI) is an infection of any part of the urinary tract.  This condition is caused by germs in your genital area.  There are many risk factors for a UTI. These include having a small, thin tube to drain pee and not being able to control when you pee or poop.  Treatment includes antibiotic medicines for germs.  Drink enough fluid to keep your pee pale yellow. This information is not intended to replace advice given to you by your health care provider. Make sure you discuss any questions you have with your health care provider. Document Released: 07/03/2007 Document  Revised: 01/01/2018 Document Reviewed: 07/24/2017 Elsevier Patient Education  2020 [place].   Dietary Guidelines to Help Prevent Kidney Stones Kidney stones are deposits of minerals and salts that form inside your kidneys. Your risk of developing kidney stones may be greater depending on your diet, your lifestyle, the medicines you take, and whether you have certain medical conditions. Most people can reduce their chances of developing kidney stones by following the instructions below. Depending on your overall health and the type of kidney stones you tend to develop, your dietitian may give you more specific instructions. What are tips for following this plan? Reading food labels  Choose foods with "no salt added" or "low-salt" labels. Limit your sodium intake to less than 1500 mg per day.  Choose foods with calcium for each meal and snack. Try to eat about 300 mg of calcium at each meal. Foods that contain 200-500 mg of calcium per serving include: ? 8 oz (237 ml) of milk, fortified nondairy milk, and fortified fruit juice. ? 8 oz (237 ml) of kefir, yogurt, and soy yogurt. ? 4 oz (118 ml) of tofu. ? 1 oz of cheese. ? 1 cup (300 g) of dried figs. ? 1 cup (91 g) of cooked broccoli. ? 1-3 oz can of sardines or mackerel.  Most people need 1000 to 1500 mg of calcium each day. Talk to your dietitian about how much calcium is recommended for you. Shopping  Buy plenty of fresh fruits and vegetables. Most people do not need to avoid fruits and vegetables, even if they contain nutrients that may contribute to kidney stones.  When shopping for convenience foods, choose: ? Whole pieces of fruit. ? Premade salads with dressing on the side. ? Low-fat fruit and yogurt smoothies.  Avoid buying frozen meals or prepared deli foods.  Look for foods with live cultures, such as yogurt and kefir. Cooking  Do not add salt to food when cooking. Place a salt shaker on the table and allow each  person to add his or her own salt to taste.  Use vegetable protein, such as beans, textured vegetable protein (TVP), or tofu instead of meat in pasta, casseroles, and soups. Meal planning   Eat less salt, if told by your dietitian. To do this: ? Avoid eating processed or premade food. ? Avoid eating fast food.  Eat less animal protein, including cheese, meat, poultry, or fish, if told by your dietitian. To do this: ? Limit the number of times you have meat, poultry, fish, or cheese each week. Eat a diet free of meat at least 2 days a week. ? Eat only one serving each day  of meat, poultry, fish, or seafood. ? When you prepare animal protein, cut pieces into small portion sizes. For most meat and fish, one serving is about the size of one deck of cards.  Eat at least 5 servings of fresh fruits and vegetables each day. To do this: ? Keep fruits and vegetables on hand for snacks. ? Eat 1 piece of fruit or a handful of berries with breakfast. ? Have a salad and fruit at lunch. ? Have two kinds of vegetables at dinner.  Limit foods that are high in a substance called oxalate. These include: ? Spinach. ? Rhubarb. ? Beets. ? Potato chips and french fries. ? Nuts.  If you regularly take a diuretic medicine, make sure to eat at least 1-2 fruits or vegetables high in potassium each day. These include: ? Avocado. ? Banana. ? Orange, prune, carrot, or tomato juice. ? Baked potato. ? Cabbage. ? Beans and split peas. General instructions   Drink enough fluid to keep your urine clear or pale yellow. This is the most important thing you can do.  Talk to your health care provider and dietitian about taking daily supplements. Depending on your health and the cause of your kidney stones, you may be advised: ? Not to take supplements with vitamin C. ? To take a calcium supplement. ? To take a daily probiotic supplement. ? To take other supplements such as magnesium, fish oil, or vitamin  B6.  Take all medicines and supplements as told by your health care provider.  Limit alcohol intake to no more than 1 drink a day for nonpregnant women and 2 drinks a day for men. One drink equals 12 oz of beer, 5 oz of wine, or 1 oz of hard liquor.  Lose weight if told by your health care provider. Work with your dietitian to find strategies and an eating plan that works best for you. What foods are not recommended? Limit your intake of the following foods, or as told by your dietitian. Talk to your dietitian about specific foods you should avoid based on the type of kidney stones and your overall health. Grains Breads. Bagels. Rolls. Baked goods. Salted crackers. Cereal. Pasta. Vegetables Spinach. Rhubarb. Beets. Canned vegetables. Angie Fava. Olives. Meats and other protein foods Nuts. Nut butters. Large portions of meat, poultry, or fish. Salted or cured meats. Deli meats. Hot dogs. Sausages. Dairy Cheese. Beverages Regular soft drinks. Regular vegetable juice. Seasonings and other foods Seasoning blends with salt. Salad dressings. Canned soups. Soy sauce. Ketchup. Barbecue sauce. Canned pasta sauce. Casseroles. Pizza. Lasagna. Frozen meals. Potato chips. [place] fries. Summary  You can reduce your risk of kidney stones by making changes to your diet.  The most important thing you can do is drink enough fluid. You should drink enough fluid to keep your urine clear or pale yellow.  Ask your health care provider or dietitian how much protein from animal sources you should eat each day, and also how much salt and calcium you should have each day. This information is not intended to replace advice given to you by your health care provider. Make sure you discuss any questions you have with your health care provider. Document Released: 05/11/2010 Document Revised: 05/06/2018 Document Reviewed: 12/26/2015 Elsevier Patient Education  2020 [employer].

## 2018-09-07 NOTE — Progress Notes (Signed)
09/07/18 1:38 PM   Tollie Eth 1955-10-13 [id number]  Referring provider: Perrin Maltese, MD [address] [place],  [place] [address]  CC: Hematuria, right renal stone  HPI: I saw Ms. Sisk in urology clinic today in consultation from Dr. Yancey Flemings for hematuria and an 8 mm non-obstructive right renal stone.  She is a 63 year old co-morbid female with medical history notable for obesity, diabetes, COPD, CHF, cirrhosis, and aortic valve replacement who has a questionable history of gross hematuria.  She reports a 3-day history of asymptomatic "coke" colored urine about a month ago.  She is also had varying amounts of microscopic hematuria on urinalysis, but unclear if these were associated with urinary tract infections.  She denies a history of recurrent urinary tract infections.  She denies any flank pain.  She has a 25-pack-year smoking history and quit 1995.  She denies any other carcinogenic exposures.  There are no aggravating or alleviating factors.  Severity is moderate.  She denies any family history of any urologic malignancies.  She has a history of a single spontaneously passed kidney stone in 1991.  She was reportedly considering bariatric surgery recently and had an IVC filter placed prophylactically, however ultimately decided to not go through with gastric bypass.  Urinalysis today notable for greater than 30 RBCs, 11-30 WBCs, few bacteria.   PMH: Past Medical History:  Diagnosis Date  . Acute on chronic renal failure ([facility])   . Anemia   . Asthma 1975  . COPD (chronic obstructive pulmonary disease) ([place] [facility])   . Diabetes ([place])   . Diabetes mellitus without complication ([facility]) 9562  . Diastolic CHF, acute on chronic ([facility])   . H/O aortic valve replacement   . Heart disease   . Hypothyroid   . Liver cirrhosis secondary to NASH (nonalcoholic steatohepatitis) ([facility] [place])   . Morbid obesity ([place])   . 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 OSA  (obstructive sleep apnea)     Surgical History: Past Surgical History:  Procedure Laterality Date  . aortic valve relaced   2008  . BACK SURGERY     AS CHILD  . BREAST BIOPSY Left 04/24/2011   left breast bx neg done by dr byrnett  . BREAST SURGERY  September 25, 2011   intraductal papilloma of the left breast  . CHOLECYSTECTOMY  2007  . COLONOSCOPY WITH PROPOFOL N/A 08/04/2014   Procedure: COLONOSCOPY WITH PROPOFOL;  Surgeon: Manya Silvas, MD;  Location: [facility] ENDOSCOPY;  Service: Endoscopy;  Laterality: N/A;  . ESOPHAGOGASTRODUODENOSCOPY Left 03/03/2015   Procedure: ESOPHAGOGASTRODUODENOSCOPY (EGD);  Surgeon: Manya Silvas, MD;  Location: [facility] ENDOSCOPY;  Service: Endoscopy;  Laterality: Left;  . ESOPHAGOGASTRODUODENOSCOPY (EGD) WITH PROPOFOL  08/04/2014   Procedure: ESOPHAGOGASTRODUODENOSCOPY (EGD) WITH PROPOFOL;  Surgeon: Manya Silvas, MD;  Location[facility] ENDOSCOPY;  Service: Endoscopy;;  . ESOPHAGOGASTRODUODENOSCOPY (EGD) WITH PROPOFOL N/A 04/09/2016   Procedure: ESOPHAGOGASTRODUODENOSCOPY (EGD) WITH PROPOFOL;  Surgeon: Lucilla Lame, MD;  Location: [facility] ENDOSCOPY;  Service: Endoscopy;  Laterality: N/A;  . PHRENIC NERVE PACEMAKER IMPLANTATION  2009  . TRACHEOSTOMY TUBE PLACEMENT N/A 02/04/2014   Procedure: TRACHEOSTOMY;  Surgeon: Rozetta Nunnery, MD;  Location: [facility];  Service: ENT;  Laterality: N/A;    Allergies:  Allergies  Allergen Reactions  . Furosemide Other (See Comments)    Pt states that this medication caused renal failure.    . Mold Extract [Trichophyton] Anaphylaxis  . Tetanus Toxoids Other (See Comments)  Pt states that her arm turns black.    Towell Crigler [Beclomethasone] Other (See Comments)    Reaction:  Thrush   . Toprol Xl [Metoprolol Tartrate] Hives  . Hctz [Hydrochlorothiazide] Other (See Comments)    Reports renal failure   . Nickel   . Nitroglycerin Other (See Comments)    Pt states that this medication makes her BP bottom out.    . Other Hives,  Swelling and Other (See Comments)    Pt states that she is allergic to steroids.      Family History: Family History  Problem Relation Age of Onset  . Skin telangiectasia Mother   . Paget's disease of bone Mother   . Cancer Mother        skin  . Breast cancer Maternal Grandmother   . Cancer Maternal Grandmother        ovarian, pancreatic  . Alzheimer's disease Father   . Atrial fibrillation Father   . Lung cancer Maternal Grandfather     Social History:  reports that she quit smoking about 10 years ago. She has never used smokeless tobacco. She reports that she does not drink alcohol or use drugs.  ROS: Please see flowsheet from today's date for complete review of systems.  Physical Exam: BP (!) 161/82 (BP Location: Left Arm, Patient Position: Sitting, Cuff Size: Normal)   Pulse (!) 102   Ht 5' 7.5" (1.715 m)   Wt (!) 321 lb 8 oz (145.8 kg)   BMI 49.61 kg/m    Constitutional:  Alert and oriented, No acute distress.  Obese, in wheelchair Cardiovascular: No clubbing, cyanosis, or edema. Respiratory: Normal respiratory effort, no increased work of breathing. GI: Abdomen is soft, nontender, nondistended, no abdominal masses GU: No CVA tenderness Lymph: No cervical or inguinal lymphadenopathy. Skin: No rashes, bruises or suspicious lesions. Neurologic: Grossly intact, no focal deficits, moving all 4 extremities. Psychiatric: Normal mood and affect.  Laboratory Data: Reviewed   Pertinent Imaging: I have personally reviewed the CT abdomen pelvis with IV contrast on outside disc.  There is a right 8 mm nonobstructive lower pole stone, unable to measure stone density, skin to stone distance greater than 16 cm.  No renal masses.  Assessment & Plan:   In summary, the patient is a comorbid 63 year old female with what sounds like one episode of gross hematuria, as well as persistent microscopic hematuria, and CT showing a non-obstructive right 8 mm lower pole kidney stone but no  renal masses.  We discussed common possible etiologies of hematuria including malignancy, urolithiasis, medical renal disease, and idiopathic. Standard workup recommended by the AUA includes imaging with CT urogram to assess the upper tracts, and cystoscopy. Cytology is performed on patient's with gross hematuria to look for malignant cells in the urine.  We discussed options for her non-obstructive right-sided stone including observation, ureteroscopy, and shockwave.  She would not be a good candidate for shockwave secondary to her large skin to stone distance.  I recommended observation with her co-morbidities and lack of symptoms.  We discussed general stone prevention strategies including adequate hydration with goal of producing 2.5 L of urine daily, increasing citric acid intake, increasing calcium intake during high oxalate meals, minimizing animal protein, and decreasing salt intake. Information about dietary recommendations given today.   RTC for cystoscopy to complete hematuria workup RTC one year with KUB for stone surveillance   Billey Co, MD  [facility] [address] [place], [place] [address] [phone]  [phone]   

## 2018-09-09 LAB — CULTURE, URINE COMPREHENSIVE

## 2018-09-24 ENCOUNTER — Other Ambulatory Visit: Payer: Medicare Other | Admitting: Urology

## 2018-09-25 ENCOUNTER — Other Ambulatory Visit: Payer: Self-pay | Admitting: Internal Medicine

## 2018-09-25 DIAGNOSIS — M51369 Other intervertebral disc degeneration, lumbar region without mention of lumbar back pain or lower extremity pain: Secondary | ICD-10-CM

## 2018-09-25 DIAGNOSIS — M5136 Other intervertebral disc degeneration, lumbar region: Secondary | ICD-10-CM

## 2018-09-25 DIAGNOSIS — M545 Low back pain, unspecified: Secondary | ICD-10-CM

## 2018-09-29 ENCOUNTER — Telehealth (HOSPITAL_COMMUNITY): Payer: Self-pay

## 2018-09-30 ENCOUNTER — Telehealth (HOSPITAL_COMMUNITY): Payer: Self-pay

## 2018-10-12 ENCOUNTER — Other Ambulatory Visit: Payer: Self-pay

## 2018-10-12 ENCOUNTER — Encounter: Payer: Self-pay | Admitting: Urology

## 2018-10-12 ENCOUNTER — Ambulatory Visit (INDEPENDENT_AMBULATORY_CARE_PROVIDER_SITE_OTHER): Payer: Medicare Other | Admitting: Urology

## 2018-10-12 VITALS — BP 119/69 | HR 77 | Ht 67.0 in | Wt 327.0 lb

## 2018-10-12 DIAGNOSIS — N200 Calculus of kidney: Secondary | ICD-10-CM | POA: Diagnosis not present

## 2018-10-12 DIAGNOSIS — R3121 Asymptomatic microscopic hematuria: Secondary | ICD-10-CM

## 2018-10-12 LAB — URINALYSIS, COMPLETE
Bilirubin, UA: NEGATIVE
Ketones, UA: NEGATIVE
Nitrite, UA: NEGATIVE
RBC, UA: NEGATIVE
Specific Gravity, UA: 1.025 (ref 1.005–1.030)
Urobilinogen, Ur: 2 mg/dL — ABNORMAL HIGH (ref 0.2–1.0)
pH, UA: 5.5 (ref 5.0–7.5)

## 2018-10-12 LAB — MICROSCOPIC EXAMINATION
Bacteria, UA: NONE SEEN
RBC, Urine: NONE SEEN /hpf (ref 0–2)

## 2018-10-12 NOTE — Progress Notes (Signed)
Cystoscopy Procedure Note:  Indication: Microscopic hematuria  After informed consent and discussion of the procedure and its risks, Theresa Leon was positioned and prepped in the standard fashion. Cystoscopy was performed with a flexible cystoscope. The urethra, bladder neck and entire bladder was visualized in a standard fashion. The ureteral orifices were visualized in their normal location and orientation. No abnormalities on retroflexion. Bladder mucosa normal throughout.  Findings: Normal cystoscopy  Assessment and Plan: RTC one year for KUB for surveillance of known right 83m lower pole stone seen on prior outside [facility]ynn MD 10/12/2018

## 2018-10-12 NOTE — Patient Instructions (Signed)

## 2019-02-08 ENCOUNTER — Emergency Department: Payer: Medicare Other

## 2019-02-08 ENCOUNTER — Emergency Department
Admission: EM | Admit: 2019-02-08 | Discharge: 2019-02-08 | Disposition: A | Discharge status: Home / Self Care | Payer: Medicare Other | Attending: Emergency Medicine | Admitting: Emergency Medicine

## 2019-02-08 ENCOUNTER — Other Ambulatory Visit: Payer: Self-pay

## 2019-02-08 DIAGNOSIS — N130 Hydronephrosis with ureteropelvic junction obstruction: Secondary | ICD-10-CM | POA: Insufficient documentation

## 2019-02-08 DIAGNOSIS — Z7984 Long term (current) use of oral hypoglycemic drugs: Secondary | ICD-10-CM | POA: Insufficient documentation

## 2019-02-08 DIAGNOSIS — N200 Calculus of kidney: Secondary | ICD-10-CM | POA: Diagnosis not present

## 2019-02-08 DIAGNOSIS — Z20822 Contact with and (suspected) exposure to covid-19: Secondary | ICD-10-CM | POA: Insufficient documentation

## 2019-02-08 DIAGNOSIS — E119 Type 2 diabetes mellitus without complications: Secondary | ICD-10-CM | POA: Insufficient documentation

## 2019-02-08 DIAGNOSIS — Q6211 Congenital occlusion of ureteropelvic junction: Secondary | ICD-10-CM

## 2019-02-08 DIAGNOSIS — J449 Chronic obstructive pulmonary disease, unspecified: Secondary | ICD-10-CM | POA: Insufficient documentation

## 2019-02-08 DIAGNOSIS — Z87891 Personal history of nicotine dependence: Secondary | ICD-10-CM | POA: Diagnosis not present

## 2019-02-08 DIAGNOSIS — Z853 Personal history of malignant neoplasm of breast: Secondary | ICD-10-CM | POA: Insufficient documentation

## 2019-02-08 DIAGNOSIS — I5032 Chronic diastolic (congestive) heart failure: Secondary | ICD-10-CM | POA: Insufficient documentation

## 2019-02-08 DIAGNOSIS — Z79899 Other long term (current) drug therapy: Secondary | ICD-10-CM | POA: Insufficient documentation

## 2019-02-08 DIAGNOSIS — R1031 Right lower quadrant pain: Secondary | ICD-10-CM | POA: Diagnosis present

## 2019-02-08 LAB — COMPREHENSIVE METABOLIC PANEL
ALT: 19 U/L (ref 0–44)
AST: 20 U/L (ref 15–41)
Albumin: 3.7 g/dL (ref 3.5–5.0)
Alkaline Phosphatase: 66 U/L (ref 38–126)
Anion gap: 9 (ref 5–15)
BUN: 17 mg/dL (ref 8–23)
CO2: 28 mmol/L (ref 22–32)
Calcium: 8.9 mg/dL (ref 8.9–10.3)
Chloride: 103 mmol/L (ref 98–111)
Creatinine, Ser: 1.42 mg/dL — ABNORMAL HIGH (ref 0.44–1.00)
GFR calc Af Amer: 46 mL/min — ABNORMAL LOW (ref >60)
GFR calc non Af Amer: 39 mL/min — ABNORMAL LOW (ref >60)
Glucose, Bld: 165 mg/dL — ABNORMAL HIGH (ref 70–99)
Potassium: 4.3 mmol/L (ref 3.5–5.1)
Sodium: 140 mmol/L (ref 135–145)
Total Bilirubin: 2 mg/dL — ABNORMAL HIGH (ref 0.3–1.2)
Total Protein: 7.2 g/dL (ref 6.5–8.1)

## 2019-02-08 LAB — URINALYSIS, COMPLETE (UACMP) WITH MICROSCOPIC
Bilirubin Urine: NEGATIVE
Glucose, UA: NEGATIVE mg/dL
Ketones, ur: NEGATIVE mg/dL
Nitrite: NEGATIVE
Protein, ur: NEGATIVE mg/dL
RBC / HPF: >50 RBC/hpf — ABNORMAL HIGH (ref 0–5)
Specific Gravity, Urine: 1.021 (ref 1.005–1.030)
pH: 5 (ref 5.0–8.0)

## 2019-02-08 LAB — CBC
HCT: 42.8 % (ref 36.0–46.0)
Hemoglobin: 14.3 g/dL (ref 12.0–15.0)
MCH: 30.4 pg (ref 26.0–34.0)
MCHC: 33.4 g/dL (ref 30.0–36.0)
MCV: 91.1 fL (ref 80.0–100.0)
Platelets: 89 10*3/uL — ABNORMAL LOW (ref 150–400)
RBC: 4.7 MIL/uL (ref 3.87–5.11)
RDW: 12.9 % (ref 11.5–15.5)
WBC: 6 10*3/uL (ref 4.0–10.5)
nRBC: 0 % (ref 0.0–0.2)

## 2019-02-08 LAB — LIPASE, BLOOD: Lipase: 38 U/L (ref 11–51)

## 2019-02-08 MED ORDER — OXYCODONE-ACETAMINOPHEN 5-325 MG PO TABS
1.0000 | ORAL_TABLET | Freq: Once | ORAL | Status: AC
  Administered 2019-02-08: 1 via ORAL
  Filled 2019-02-08: qty 1

## 2019-02-08 MED ORDER — KETOROLAC TROMETHAMINE 30 MG/ML IJ SOLN
30.0000 mg | Freq: Once | INTRAMUSCULAR | Status: AC
  Administered 2019-02-08: 30 mg via INTRAMUSCULAR
  Filled 2019-02-08: qty 1

## 2019-02-08 NOTE — ED Notes (Signed)
See triage note  Presents with abd pain  State pain is on right side   Afebrile on arrival   No n/v

## 2019-02-08 NOTE — Discharge Instructions (Addendum)
You may use your prescribed pain medication for pain relief. If you need a higher dose, you must contact the prescriber who writes your chronic pain medication.  However, you may not have any NSAIDs.  This includes Motrin, ibuprofen, Aleve, Advil.  No aspirin.  You may continue your other regularly prescribed medications.  Follow-up in the morning with urology.  Their number is listed.  You will need to be seen tomorrow, will be scheduled for surgery this week.

## 2019-02-08 NOTE — ED Notes (Signed)
Pt took extended release oxycodone before arrival.

## 2019-02-08 NOTE — ED Triage Notes (Signed)
Reports RLQ  That radiates down. Pain began this AM. Pt reports decreased urination today and decreased PO intake. Denies NVD. Denies CP or SOB. Pt alert and oriented X4, cooperative, RR even and unlabored, color WNL. Pt in NAD. Denies radiation to flank.

## 2019-02-08 NOTE — ED Provider Notes (Signed)
[facility] Emergency Department Provider Note  ____________________________________________  Time seen: Approximately 7:13 PM  I have reviewed the triage vital signs and the nursing notes.   HISTORY  Chief Complaint Abdominal Pain    HPI Theresa Leon is a 63 y.o. female who presents the emergency department for evaluation of right side, right groin pain with associated hematuria.  Patient has a history of nephrolithiasis on the right side.  Patient has a known 8 mm stone in the right kidney.  She states that she started to have hematuria, and has had pain from the right side into the right groin region.  She states that the pain has been somewhat similar to her previous kidney stone pain.  Patient denies any visualized past stone.  Patient denies any fevers or chills, nausea or vomiting.  She has had cholecystitis and cholecystectomy but does still have her appendix.  She denies any other complaints at this time.  Patient has a history of anemia, asthma, COPD, diabetes, CHF, hypothyroidism, liver cirrhosis, sleep apnea.  Denies any complaints of chronic medical issues.         Past Medical History:  Diagnosis Date  . Acute on chronic renal failure ([place])   . Anemia   . Asthma 1975  . COPD (chronic obstructive pulmonary disease) ([place])   . Diabetes ([place])   . Diabetes mellitus without complication ([place]) 3500  . Diastolic CHF, acute on chronic (HCC)   . H/O aortic valve replacement   . Heart disease   . Hypothyroid   . Liver cirrhosis secondary to NASH (nonalcoholic steatohepatitis) ([place])   . Morbid obesity ([place])   . 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 OSA (obstructive sleep apnea)     Patient Active Problem List   Diagnosis Date Noted  . Chest pain 03/05/2017  . V tach ([place]) 09/18/2015  . Iron deficiency anemia due to chronic blood loss 05/03/2015  . Gastritis due to nonsteroidal anti-inflammatory drug (NSAID)  03/05/2015  . History of esophagogastroduodenoscopy (EGD) 03/05/2015  . Type 2 diabetes mellitus ([place]) 03/01/2015  . GERD (gastroesophageal reflux disease) 03/01/2015  . HLD (hyperlipidemia) 03/01/2015  . COPD (chronic obstructive pulmonary disease) ([place]) 03/01/2015  . Chronic diastolic CHF (congestive heart failure) ([place]) 03/01/2015  . Hypothyroidism 03/01/2015  . OSA on CPAP 03/01/2015  . Acute posthemorrhagic anemia 03/01/2015  . Dysphagia   . CHF (congestive heart failure) ([place])   . Acute respiratory failure with hypoxia ([place]) 01/26/2014  . Encounter for imaging study to confirm orogastric (OG) tube placement   . Respiratory failure ([place])   . 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 aortic valve relaced   2008  . BACK SURGERY     AS CHILD  . BREAST BIOPSY Left 04/24/2011   left breast bx neg done by dr byrnett  . BREAST SURGERY  September 25, 2011   intraductal papilloma of the left breast  . CHOLECYSTECTOMY  2007  . COLONOSCOPY WITH PROPOFOL N/A 08/04/2014   Procedure: COLONOSCOPY WITH PROPOFOL;  Surgeon: Manya Silvas, MD;  Location[facility]COPY;  Service: Endoscopy;  Laterality: N/A;  . ESOPHAGOGASTRODUODENOSCOPY Left 03/03/2015   Procedure: ESOPHAGOGASTRODUODENOSCOPY (EGD);  Surgeon: Manya Silvas, MD;  Location: [facility] ENDOSCOPY;  Service: Endoscopy;  Laterality: Left;  . ESOPHAGOGASTRODUODENOSCOPY (EGD) WITH PROPOFOL  08/04/2014   Procedure: ESOPHAGOGASTRODUODENOSCOPY (EGD) WITH PROPOFOL;  Surgeon: Manya Silvas,  MD;  Location: [facility] ENDOSCOPY;  Service: Endoscopy;;  . ESOPHAGOGASTRODUODENOSCOPY (EGD) WITH PROPOFOL N/A 04/09/2016   Procedure: ESOPHAGOGASTRODUODENOSCOPY (EGD) WITH PROPOFOL;  Surgeon: Lucilla Lame, MD;  Location: [facility] ENDOSCOPY;  Service: Endoscopy;  Laterality: N/A;  . PHRENIC NERVE PACEMAKER IMPLANTATION  2009  . TRACHEOSTOMY TUBE PLACEMENT N/A 02/04/2014   Procedure: TRACHEOSTOMY;  Surgeon:  Rozetta Nunnery, MD;  Location: [place];  Service: ENT;  Laterality: N/A;    Prior to Admission medications   Medication Sig Start Date End Date Taking? Authorizing Provider  acetaminophen (TYLENOL) 500 MG tablet Take 1,000 mg by mouth every 6 (six) hours as needed for mild pain or headache.    [provider]  albuterol (PROVENTIL HFA;VENTOLIN HFA) 108 (90 BASE) MCG/ACT inhaler Inhale 2 puffs into the lungs every 6 (six) hours as needed for wheezing or shortness of breath.     [provider]  Calcium Carbonate-Vitamin D (CALCIUM 600+D) 600-400 MG-UNIT tablet Take 1 tablet by mouth at bedtime.    [provider]  cholecalciferol (VITAMIN D) 1000 units tablet Take 1,000 Units by mouth daily.    [provider]  citalopram (CELEXA) 40 MG tablet Take 40 mg by mouth daily.    [provider]  EPINEPHrine (EPIPEN 2-PAK) 0.3 mg/0.3 mL IJ SOAJ injection Inject 0.3 mg into the muscle once as needed (for severe allergic reaction).    [provider]  fluticasone (FLONASE) 50 MCG/ACT nasal spray Place 2 sprays into both nostrils daily as needed for rhinitis.     [provider]  gabapentin (NEURONTIN) 600 MG tablet Take 600 mg by mouth 4 (four) times daily.     [provider]  ipratropium-albuterol (DUONEB) 0.5-2.5 (3) MG/3ML SOLN Take 3 mLs by nebulization 3 (three) times daily. 06/29/17   Gladstone Lighter, MD  levothyroxine (SYNTHROID, LEVOTHROID) 125 MCG tablet Take 100 mcg by mouth daily before breakfast.     [provider]  loratadine (CLARITIN) 10 MG tablet Take 10 mg by mouth daily.    [provider]  metFORMIN (GLUCOPHAGE-XR) 500 MG 24 hr tablet Take 500 mg by mouth 2 (two) times daily.     [provider]  montelukast (SINGULAIR) 10 MG tablet Take 10 mg by mouth at bedtime.    [provider]  sucralfate (CARAFATE) 1 g tablet Take 1 tablet (1 g total) by mouth 4 (four) times daily -   with meals and at bedtime. 03/05/15   Theodoro Grist, MD  torsemide (DEMADEX) 20 MG tablet Take 1 tablet by mouth daily. 06/03/17   [provider]  traZODone (DESYREL) 50 MG tablet Take 50 mg by mouth at bedtime.     [provider]  Vitamin D, Ergocalciferol, (DRISDOL) 50000 units CAPS capsule Take 50,000 Units by mouth every 7 (seven) days. Fridays    [provider]  XTAMPZA ER 9 MG C12A Take 1 capsule by mouth 3 (three) times daily. 06/11/17   [provider]    Allergies Furosemide, Mold extract [trichophyton], Tetanus toxoids, Qvar [beclomethasone], Toprol xl [metoprolol tartrate], Hctz [hydrochlorothiazide], Nickel, Nitroglycerin, and Other  Family History  Problem Relation Age of Onset  . Skin telangiectasia Mother   . Paget's disease of bone Mother   . Cancer Mother        skin  . Breast cancer Maternal Grandmother   . Cancer Maternal Grandmother        ovarian, pancreatic  . Alzheimer's disease Father   . Atrial fibrillation Father   .  Lung cancer Maternal Grandfather     Social History Social History   Tobacco Use  . Smoking status: Former Smoker    Quit date: 07/28/2008    Years since quitting: 10.5  . Smokeless tobacco: Never Used  Substance Use Topics  . Alcohol use: No  . Drug use: No     Review of Systems  Constitutional: No fever/chills Eyes: No visual changes. No discharge ENT: No upper respiratory complaints. Cardiovascular: no chest pain. Respiratory: no cough. No SOB. Gastrointestinal: Right side, right suprapubic pain.  No nausea, no vomiting.  No diarrhea.  No constipation. Genitourinary: Negative for dysuria.  Positive hematuria. Musculoskeletal: Negative for musculoskeletal pain. Skin: Negative for rash, abrasions, lacerations, ecchymosis. Neurological: Negative for headaches, focal weakness or numbness. 10-point ROS otherwise negative.  ____________________________________________   PHYSICAL EXAM:  VITAL  SIGNS: ED Triage Vitals  Enc Vitals Group     BP 02/08/19 1754 (!) 134/110     Pulse Rate 02/08/19 1754 65     Resp 02/08/19 1754 16     Temp 02/08/19 1754 98.8 F (37.1 C)     Temp Source 02/08/19 1754 Oral     SpO2 02/08/19 1754 95 %     Weight 02/08/19 1755 (!) 319 lb (144.7 kg)     Height 02/08/19 1755 5' 7"  (1.702 m)     Head Circumference --      Peak Flow --      Pain Score 02/08/19 1755 7     Pain Loc --      Pain Edu? --      Excl. in [place]? --      Constitutional: Alert and oriented. Well appearing and in no acute distress. Eyes: Conjunctivae are normal. PERRL. EOMI. Head: Atraumatic. ENT:      Ears:       Nose: No congestion/rhinnorhea.      Mouth/Throat: Mucous membranes are moist.  Neck: No stridor.    Cardiovascular: Normal rate, regular rhythm. Normal S1 and S2.  Good peripheral circulation. Respiratory: Normal respiratory effort without tachypnea or retractions. Lungs CTAB. Good air entry to the bases with no decreased or absent breath sounds. Gastrointestinal: Bowel sounds 4 quadrants.  Soft to palpation all quadrants.  Patient had mild tenderness on the right lateral abdominal wall extending into the right suprapubic region.  No frank right lower quadrant tenderness.  No rebound tenderness.  No Rovsing's for operators.  No guarding or rigidity. No palpable masses. No distention. No CVA tenderness. Musculoskeletal: Full range of motion to all extremities. No gross deformities appreciated. Neurologic:  Normal speech and language. No gross focal neurologic deficits are appreciated.  Skin:  Skin is warm, dry and intact. No rash noted. Psychiatric: Mood and affect are normal. Speech and behavior are normal. Patient exhibits appropriate insight and judgement.   ____________________________________________   LABS (all labs ordered are listed, but only abnormal results are displayed)  Labs Reviewed  COMPREHENSIVE METABOLIC PANEL - Abnormal; Notable for the  following components:      Result Value   Glucose, Bld 165 (*)    Creatinine, Ser 1.42 (*)    Total Bilirubin 2.0 (*)    GFR calc non Af Amer 39 (*)    GFR calc Af Amer 46 (*)    All other components within normal limits  CBC - Abnormal; Notable for the following components:   Platelets 89 (*)    All other components within normal limits  URINALYSIS, COMPLETE (UACMP) WITH MICROSCOPIC - Abnormal;  Notable for the following components:   Color, Urine YELLOW (*)    APPearance HAZY (*)    Hgb urine dipstick LARGE (*)    Leukocytes,Ua SMALL (*)    RBC / HPF >50 (*)    Bacteria, UA RARE (*)    All other components within normal limits  SARS CORONAVIRUS 2 (TAT 6-24 HRS)  LIPASE, BLOOD   ____________________________________________  EKG   ____________________________________________  RADIOLOGY I personally viewed and evaluated these images as part of my medical decision making, as well as reviewing the written report by the radiologist.  CT Renal Stone Study  Result Date: 02/08/2019 CLINICAL DATA:  Right lower quadrant pain EXAM: CT ABDOMEN AND PELVIS WITHOUT CONTRAST TECHNIQUE: Multidetector CT imaging of the abdomen and pelvis was performed following the standard protocol without IV contrast. COMPARISON:  11/15/2015 FINDINGS: Lower chest: No acute abnormality. Hepatobiliary: Nodular contours of the liver compatible with cirrhosis. Prior cholecystectomy. No focal hepatic abnormality. Pancreas: No focal abnormality or ductal dilatation. Spleen: Spleen is enlarged with a craniocaudal length of 15 cm. Adrenals/Urinary Tract: 11 mm stone at the right ureteropelvic junction with moderate right hydronephrosis and perinephric stranding. Punctate nonobstructing stone in the lower pole of the right kidney. No hydronephrosis on the left. Adrenal glands and urinary bladder are unremarkable. Stomach/Bowel: Normal appendix. Stomach, large and small bowel grossly unremarkable. Vascular/Lymphatic: IVC  filter in place. Aortic atherosclerosis. No aneurysm or adenopathy. Reproductive: Uterus and adnexa unremarkable.  No mass. Other: No free fluid or free air. Small umbilical hernia containing fat. Musculoskeletal: No acute bony abnormality. IMPRESSION: 11 mm right UPJ stone with moderate right hydronephrosis and perinephric stranding. Cirrhosis.  Associated splenomegaly. Umbilical hernia containing fat. Electronically Signed   By: Rolm Baptise M.D.   On: 02/08/2019 20:04    ____________________________________________    PROCEDURES  Procedure(s) performed:    Procedures    Medications  ketorolac (TORADOL) 30 MG/ML injection 30 mg (30 mg Intramuscular Given 02/08/19 2106)  oxyCODONE-acetaminophen (PERCOCET/ROXICET) 5-325 MG per tablet 1 tablet (1 tablet Oral Given 02/08/19 2106)     ____________________________________________   INITIAL IMPRESSION / ASSESSMENT AND PLAN / ED COURSE  Pertinent labs & imaging results that were available during my care of the patient were reviewed by me and considered in my medical decision making (see chart for details).  Review of the [place] CSRS was performed in accordance of the [place] prior to dispensing any controlled drugs.           Patient's diagnosis is consistent with nephrolithiasis with hydronephrosis.  Patient presented to emergency department complaining of right side and groin pain.  Patient did have some hematuria as well.  Patient had a history of 8 mm right sided nephrolithiasis.  At the time, no surgical intervention as patient has been asymptomatic.  However, I suspected that patient nephrolithiasis was moving.  Given her symptoms I evaluated her with a CT scan.  This results with an 11 mm right-sided nephrolithiasis in the UPJ with associated hydronephrosis and perinephric stranding.  I discussed the patient with on-call urology.  Dr. Erlene Quan with urology recommended that patient would be a suitable candidate for outpatient intervention.   Patient had no evidence of clinical infection with no fevers or chills, no elevated white blood cell count, no dysuria.  At this time patient has chronic pain medication and may continue same.  She was given injection of Toradol at urology's recommendation.  Patient is insistent informed not to take any NSAIDs leading up to surgical  intervention.  Patient is understanding of same.  She will follow-up tomorrow with urology..  Return precautions are discussed with the patient.  Patient is given ED precautions to return to the ED for any worsening or new symptoms.     ____________________________________________  FINAL CLINICAL IMPRESSION(S) / ED DIAGNOSES  Final diagnoses:  Right nephrolithiasis  Hydronephrosis with ureteropelvic junction (UPJ) obstruction      NEW MEDICATIONS STARTED DURING THIS VISIT:  ED Discharge Orders    None          This chart was dictated using voice recognition software/Dragon. Despite best efforts to proofread, errors can occur which can change the meaning. Any change was purely unintentional.    Darletta Moll, PA-C 02/08/19 2146    Vanessa [place], MD 02/08/19 2216

## 2019-02-09 ENCOUNTER — Ambulatory Visit
Admission: RE | Admit: 2019-02-09 | Discharge: 2019-02-09 | Disposition: A | Discharge status: Home / Self Care | Payer: Medicare Other | Attending: Urology | Admitting: Urology

## 2019-02-09 ENCOUNTER — Ambulatory Visit: Payer: Medicare Other | Admitting: Urology

## 2019-02-09 ENCOUNTER — Ambulatory Visit
Admission: RE | Admit: 2019-02-09 | Discharge: 2019-02-09 | Disposition: A | Discharge status: Home / Self Care | Payer: Medicare Other | Source: Ambulatory Visit | Attending: Urology | Admitting: Urology

## 2019-02-09 ENCOUNTER — Other Ambulatory Visit: Payer: Self-pay | Admitting: Urology

## 2019-02-09 ENCOUNTER — Encounter: Payer: Self-pay | Admitting: Urology

## 2019-02-09 ENCOUNTER — Other Ambulatory Visit: Payer: Self-pay

## 2019-02-09 VITALS — BP 118/65 | HR 78 | Ht 67.0 in

## 2019-02-09 DIAGNOSIS — N201 Calculus of ureter: Secondary | ICD-10-CM | POA: Insufficient documentation

## 2019-02-09 LAB — SARS CORONAVIRUS 2 (TAT 6-24 HRS): SARS Coronavirus 2: NEGATIVE

## 2019-02-09 NOTE — Progress Notes (Signed)
02/09/19 2:20 PM   Tollie Eth [birth year] [id number]  Referring provider: Perrin Maltese, MD [address] [place],  [place] [address]  CC: Right ureteral stone  HPI: I saw Ms. Kaner in urology clinic today as an add-on in clinic for a right ureteral stone.  She is a comorbid 64 year old female that I previously followed for non-obstructive 8 mm renal stone who deferred intervention previously.  She presented to the ER yesterday with 48 hours of right renal colic.  She denies any fevers, chills, or dysuria.  Urinalysis in the ED yesterday showed 6-10 squamous cells, rare bacteria, 6-10 WBCs, greater than 50 RBCs, small leukocytes, nitrite negative.  She had a slight bump in her creatinine to 1.4 from baseline of 1.0, and no leukocytosis.   CT yesterday shows a 1 cm right UPJ stone, 850HU, 18cm SSD.  Stone clearly seen on KUB today in same location.  She takes Xtampza(long acting oxycodone) for chronic pain, but has not taken any NSAIDs or aspirin since Monday.   PMH: Past Medical History:  Diagnosis Date  . Acute on chronic renal failure ([place])   . Anemia   . Asthma 1975  . COPD (chronic obstructive pulmonary disease) ([place])   . Diabetes ([place])   . Diabetes mellitus without complication ([place]) 8299  . Diastolic CHF, acute on chronic (HCC)   . H/O aortic valve replacement   . Heart disease   . Hypothyroid   . Kidney stone   . Liver cirrhosis secondary to NASH (nonalcoholic steatohepatitis) ([place])   . Morbid obesity ([place])   . 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 OSA (obstructive sleep apnea)     Surgical History: Past Surgical History:  Procedure Laterality Date  . aortic valve relaced   2008  . BACK SURGERY     AS CHILD  . BREAST BIOPSY Left 04/24/2011   left breast bx neg done by dr byrnett  . BREAST SURGERY  September 25, 2011   intraductal papilloma of the left breast  . CHOLECYSTECTOMY  2007  . COLONOSCOPY WITH PROPOFOL N/A  08/04/2014   Procedure: COLONOSCOPY WITH PROPOFOL;  Surgeon: Manya Silvas, MD;  [facility] END[facility]COPY;  Service: Endoscopy;  Laterality: N/A;  . ESOPHAGOGASTRODUODENOSCOPY Left 03/03/2015   Procedure: ESOPHAGOGASTRODUODENOSCOPY (EGD);  Surgeon: Manya Silvas, MD;  Location: [facility] ENDOSCOPY;  Service: Endoscopy;  Laterality: Left;  . ESOPHAGOGASTRODUODENOSCOPY (EGD) WITH PROPOFOL  08/04/2014   Procedure: ESOPHAGOGASTRODUODENOSCOPY (EGD) WITH PROPOFOL;  Surgeon: Manya Silvas, MD;  Location: [facility] ENDOSCOPY;  Service: Endoscopy;;  . ESOPHAGOGASTRODUODENOSCOPY (EGD) WITH PROPOFOL N/A 04/09/2016   Procedure: ESOPHAGOGASTRODUODENOSCOPY (EGD) WITH PROPOFOL;  Surgeon: Lucilla Lame, MD;  Location: [facility] ENDOSCOPY;  Service: Endoscopy;  Laterality: N/A;  . PHRENIC NERVE PACEMAKER IMPLANTATION  2009  . TRACHEOSTOMY TUBE PLACEMENT N/A 02/04/2014   Procedure: TRACHEOSTOMY;  Surgeon: Rozetta Nunnery, MD;  Location: [place];  Service: ENT;  Laterality: N/A;   Allergies:  Allergies  Allergen Reactions  . Furosemide Other (See Comments)    Pt states that this medication caused renal failure.    . Mold Extract [Trichophyton] Anaphylaxis  . Tetanus Toxoids Other (See Comments)    Pt states that her arm turns black.    Sargent Crigler [Beclomethasone] Other (See Comments)    Reaction:  Thrush   . Toprol Xl [Metoprolol Tartrate] Hives  . Hctz [Hydrochlorothiazide] Other (See Comments)    Reports renal failure   . Nickel   .  Nitroglycerin Other (See Comments)    Pt states that this medication makes her BP bottom out.    . Other Hives, Swelling and Other (See Comments)    Pt states that she is allergic to steroids.      Family History: Family History  Problem Relation Age of Onset  . Skin telangiectasia Mother   . Paget's disease of bone Mother   . Cancer Mother        skin  . Breast cancer Maternal Grandmother   . Cancer Maternal Grandmother        ovarian, pancreatic  . Alzheimer's disease Father    . Atrial fibrillation Father   . Lung cancer Maternal Grandfather     Social History:  reports that she quit smoking about 10 years ago. She has never used smokeless tobacco. She reports that she does not drink alcohol or use drugs.  ROS: Please see flowsheet from today's date for complete review of systems.  Physical Exam: BP 118/65 (BP Location: Left Arm, Patient Position: Sitting, Cuff Size: Large)   Pulse 78   Ht 5' 7"  (1.702 m)   BMI 49.96 kg/m    Constitutional:  Alert and oriented, No acute distress. Cardiovascular: Regular rate and rhythm Respiratory: Clear to auscultation bilaterally GI: Abdomen is soft, nontender, nondistended, no abdominal masses GU: Right CVA tenderness Lymph: No cervical or inguinal lymphadenopathy. Skin: No rashes, bruises or suspicious lesions. Neurologic: Grossly intact, no focal deficits, moving all 4 extremities. Psychiatric: Normal mood and affect.  Laboratory Data: Reviewed, see HPI  Pertinent Imaging: I personally reviewed the CT and KUB, see HPI  Assessment & Plan:   In summary, the patient is a 64 year old female with a right 1 cm UPJ stone.  She has no clinical or laboratory signs of infection.  Her pain is currently well controlled.  We discussed various treatment options for urolithiasis including observation with or without medical expulsive therapy, shockwave lithotripsy (SWL), ureteroscopy and laser lithotripsy with stent placement, and percutaneous nephrolithotomy.  We discussed that management is based on stone size, location, density, patient co-morbidities, and patient preference.   Stones <25m in size have a >80% spontaneous passage rate. Data surrounding the use of tamsulosin for medical expulsive therapy is controversial, but meta analyses suggests it is most efficacious for distal stones between 5-124min size. Possible side effects include dizziness/lightheadedness, and retrograde ejaculation.  SWL has a lower stone free  rate in a single procedure, but also a lower complication rate compared to ureteroscopy and avoids a stent and associated stent related symptoms. Possible complications include renal hematoma, steinstrasse, and need for additional treatment.  Ureteroscopy with laser lithotripsy and stent placement has a higher stone free rate than SWL in a single procedure, however increased complication rate including possible infection, ureteral injury, bleeding, and stent related morbidity. Common stent related symptoms include dysuria, urgency/frequency, and flank pain.  After an extensive discussion of the risks and benefits of the above treatment options, the patient would like to proceed with shockwave lithotripsy this Thursday.  We discussed at length her increased skin to stone distance that decreases the success rate of shockwave lithotripsy, and the ~10-20% chance of obstructive fragments requiring additional procedure(s).  Schedule shockwave lithotripsy Thursday 1/14  A total of 35 minutes were spent face-to-face with the patient, greater than 50% was spent in patient education, counseling, and coordination of care regarding nephrolithiasis and treatment options.   BrBilley CoMD  [facility] [address]  [place], [place] [address] [phone]

## 2019-02-09 NOTE — Patient Instructions (Signed)
Lithotripsy  Lithotripsy is a treatment that can sometimes help eliminate kidney stones and the pain that they cause. A form of lithotripsy, also known as extracorporeal shock wave lithotripsy, is a nonsurgical procedure that crushes a kidney stone with shock waves. These shock waves pass through your body and focus on the kidney stone. They cause the kidney stone to break up while it is still in the urinary tract. This makes it easier for the smaller pieces of stone to pass in the urine. Tell a health care provider about:  Any allergies you have.  All medicines you are taking, including vitamins, herbs, eye drops, creams, and over-the-counter medicines.  Any blood disorders you have.  Any surgeries you have had.  Any medical conditions you have.  Whether you are pregnant or may be pregnant.  Any problems you or family members have had with anesthetic medicines. What are the risks? Generally, this is a safe procedure. However, problems may occur, including:  Infection.  Bleeding of the kidney.  Bruising of the kidney or skin.  Scarring of the kidney, which can lead to: ? Increased blood pressure. ? Poor kidney function. ? Return (recurrence) of kidney stones.  Damage to other structures or organs, such as the liver, colon, spleen, or pancreas.  Blockage (obstruction) of the the tube that carries urine from the kidney to the bladder (ureter).  Failure of the kidney stone to break into pieces (fragments). What happens before the procedure? Staying hydrated Follow instructions from your health care provider about hydration, which may include:  Up to 2 hours before the procedure - you may continue to drink clear liquids, such as water, clear fruit juice, black coffee, and plain tea. Eating and drinking restrictions Follow instructions from your health care provider about eating and drinking, which may include:  8 hours before the procedure - stop eating heavy meals or foods  such as meat, fried foods, or fatty foods.  6 hours before the procedure - stop eating light meals or foods, such as toast or cereal.  6 hours before the procedure - stop drinking milk or drinks that contain milk.  2 hours before the procedure - stop drinking clear liquids. General instructions  Plan to have someone take you home from the hospital or clinic.  Ask your health care provider about: ? Changing or stopping your regular medicines. This is especially important if you are taking diabetes medicines or blood thinners. ? Taking medicines such as aspirin and ibuprofen. These medicines and other NSAIDs can thin your blood. Do not take these medicines for 7 days before your procedure if your health care provider instructs you not to.  You may have tests, such as: ? Blood tests. ? Urine tests. ? Imaging tests, such as a CT scan. What happens during the procedure?  To lower your risk of infection: ? Your health care team will wash or sanitize their hands. ? Your skin will be washed with soap.  An IV tube will be inserted into one of your veins. This tube will give you fluids and medicines.  You will be given one or more of the following: ? A medicine to help you relax (sedative). ? A medicine to make you fall asleep (general anesthetic).  A water-filled cushion may be placed behind your kidney or on your abdomen. In some cases you may be placed in a tub of lukewarm water.  Your body will be positioned in a way that makes it easy to target the kidney  stone.  A flexible tube with holes in it (stent) may be placed in the ureter. This will help keep urine flowing from the kidney if the fragments of the stone have been blocking the ureter.  An X-ray or ultrasound exam will be done to locate your stone.  Shock waves will be aimed at the stone. If you are awake, you may feel a tapping sensation as the shock waves pass through your body. The procedure may vary among health care  providers and hospitals. What happens after the procedure?  You may have an X-ray to see whether the procedure was able to break up the kidney stone and how much of the stone has passed. If large stone fragments remain after treatment, you may need to have a second procedure at a later time.  Your blood pressure, heart rate, breathing rate, and blood oxygen level will be monitored until the medicines you were given have worn off.  You may be given antibiotics or pain medicine as needed.  If a stent was placed in your ureter during surgery, it may stay in place for a few weeks.  You may need strain your urine to collect pieces of the kidney stone for testing.  You will need to drink plenty of water.  Do not drive for 24 hours if you were given a sedative. Summary  Lithotripsy is a treatment that can sometimes help eliminate kidney stones and the pain that they cause.  A form of lithotripsy, also known as extracorporeal shock wave lithotripsy, is a nonsurgical procedure that crushes a kidney stone with shock waves.  Generally, this is a safe procedure. However, problems may occur, including damage to the kidney or other organs, infection, or obstruction of the tube that carries urine from the kidney to the bladder (ureter).  When you go home, you will need to drink plenty of water. You may be asked to strain your urine to collect pieces of the kidney stone for testing. This information is not intended to replace advice given to you by your health care provider. Make sure you discuss any questions you have with your health care provider. Document Revised: 04/27/2018 Document Reviewed: 12/06/2015 Elsevier Patient Education  2020 [place] After This sheet gives you information about how to care for yourself after your procedure. Your health care provider may also give you more specific instructions. If you have problems or questions, contact your health care  provider. What can I expect after the procedure? After the procedure, it is common to have:  Some blood in your urine. This should only last for a few days.  Soreness in your back, sides, or upper abdomen for a few days.  Blotches or bruises on your back where the pressure wave entered the skin.  Pain, discomfort, or nausea when pieces (fragments) of the kidney stone move through the tube that carries urine from the kidney to the bladder (ureter). Stone fragments may pass soon after the procedure, but they may continue to pass for up to 4-8 weeks. ? If you have severe pain or nausea, contact your health care provider. This may be caused by a large stone that was not broken up, and this may mean that you need more treatment.  Some pain or discomfort during urination.  Some pain or discomfort in the lower abdomen or (in men) at the base of the penis. Follow these instructions at home: Medicines  Take over-the-counter and prescription medicines only as told  by your health care provider.  If you were prescribed an antibiotic medicine, take it as told by your health care provider. Do not stop taking the antibiotic even if you start to feel better.  Do not drive for 24 hours if you were given a medicine to help you relax (sedative).  Do not drive or use heavy machinery while taking prescription pain medicine. Eating and drinking      Drink enough water and fluids to keep your urine clear or pale yellow. This helps any remaining pieces of the stone to pass. It can also help prevent new stones from forming.  Eat plenty of fresh fruits and vegetables.  Follow instructions from your health care provider about eating and drinking restrictions. You may be instructed: ? To reduce how much salt (sodium) you eat or drink. Check ingredients and nutrition facts on packaged foods and beverages. ? To reduce how much meat you eat.  Eat the recommended amount of calcium for your age and gender. Ask  your health care provider how much calcium you should have. General instructions  Get plenty of rest.  Most people can resume normal activities 1-2 days after the procedure. Ask your health care provider what activities are safe for you.  Your health care provider may direct you to lie in a certain position (postural drainage) and tap firmly (percuss) over your kidney area to help stone fragments pass. Follow instructions as told by your health care provider.  If directed, strain all urine through the strainer that was provided by your health care provider. ? Keep all fragments for your health care provider to see. Any stones that are found may be sent to a medical lab for examination. The stone may be as small as a grain of salt.  Keep all follow-up visits as told by your health care provider. This is important. Contact a health care provider if:  You have pain that is severe or does not get better with medicine.  You have nausea that is severe or does not go away.  You have blood in your urine longer than your health care provider told you to expect.  You have more blood in your urine.  You have pain during urination that does not go away.  You urinate more frequently than usual and this does not go away.  You develop a rash or any other possible signs of an allergic reaction. Get help right away if:  You have severe pain in your back, sides, or upper abdomen.  You have severe pain while urinating.  Your urine is very dark red.  You have blood in your stool (feces).  You cannot pass any urine at all.  You feel a strong urge to urinate after emptying your bladder.  You have a fever or chills.  You develop shortness of breath, difficulty breathing, or chest pain.  You have severe nausea that leads to persistent vomiting.  You faint. Summary  After this procedure, it is common to have some pain, discomfort, or nausea when pieces (fragments) of the kidney stone move  through the tube that carries urine from the kidney to the bladder (ureter). If this pain or nausea is severe, however, you should contact your health care provider.  Most people can resume normal activities 1-2 days after the procedure. Ask your health care provider what activities are safe for you.  Drink enough water and fluids to keep your urine clear or pale yellow. This helps any remaining pieces of  the stone to pass, and it can help prevent new stones from forming.  If directed, strain your urine and keep all fragments for your health care provider to see. Fragments or stones may be as small as a grain of salt.  Get help right away if you have severe pain in your back, sides, or upper abdomen or have severe pain while urinating. This information is not intended to replace advice given to you by your health care provider. Make sure you discuss any questions you have with your health care provider. Document Revised: 04/27/2018 Document Reviewed: 12/06/2015 Elsevier Patient Education  2020 [employer].

## 2019-02-10 ENCOUNTER — Other Ambulatory Visit: Payer: Self-pay | Admitting: Radiology

## 2019-02-10 ENCOUNTER — Other Ambulatory Visit
Admission: RE | Admit: 2019-02-10 | Discharge: 2019-02-10 | Disposition: A | Discharge status: Home / Self Care | Payer: Medicare Other | Source: Ambulatory Visit | Attending: Urology | Admitting: Urology

## 2019-02-10 ENCOUNTER — Other Ambulatory Visit: Payer: Self-pay | Admitting: Urology

## 2019-02-10 DIAGNOSIS — N201 Calculus of ureter: Secondary | ICD-10-CM

## 2019-02-10 DIAGNOSIS — Z01812 Encounter for preprocedural laboratory examination: Secondary | ICD-10-CM | POA: Insufficient documentation

## 2019-02-10 DIAGNOSIS — Z20822 Contact with and (suspected) exposure to covid-19: Secondary | ICD-10-CM | POA: Diagnosis not present

## 2019-02-10 LAB — SARS CORONAVIRUS 2 (TAT 6-24 HRS): SARS Coronavirus 2: NEGATIVE

## 2019-02-11 ENCOUNTER — Encounter: Payer: Self-pay | Admitting: Urology

## 2019-02-11 ENCOUNTER — Encounter: Payer: Self-pay | Admitting: Anesthesiology

## 2019-02-11 ENCOUNTER — Ambulatory Visit: Payer: Medicare Other

## 2019-02-11 ENCOUNTER — Encounter
Admission: RE | Disposition: A | Discharge status: Home / Self Care | Payer: Self-pay | Source: Home / Self Care | Attending: Urology

## 2019-02-11 ENCOUNTER — Ambulatory Visit
Admission: RE | Admit: 2019-02-11 | Discharge: 2019-02-11 | Disposition: A | Discharge status: Home / Self Care | Payer: Medicare Other | Attending: Urology | Admitting: Urology

## 2019-02-11 DIAGNOSIS — Z8041 Family history of malignant neoplasm of ovary: Secondary | ICD-10-CM | POA: Insufficient documentation

## 2019-02-11 DIAGNOSIS — E1122 Type 2 diabetes mellitus with diabetic chronic kidney disease: Secondary | ICD-10-CM | POA: Diagnosis not present

## 2019-02-11 DIAGNOSIS — I5033 Acute on chronic diastolic (congestive) heart failure: Secondary | ICD-10-CM | POA: Insufficient documentation

## 2019-02-11 DIAGNOSIS — N201 Calculus of ureter: Secondary | ICD-10-CM | POA: Diagnosis not present

## 2019-02-11 DIAGNOSIS — Z9049 Acquired absence of other specified parts of digestive tract: Secondary | ICD-10-CM | POA: Diagnosis not present

## 2019-02-11 DIAGNOSIS — K7581 Nonalcoholic steatohepatitis (NASH): Secondary | ICD-10-CM | POA: Diagnosis not present

## 2019-02-11 DIAGNOSIS — Z820 Family history of epilepsy and other diseases of the nervous system: Secondary | ICD-10-CM | POA: Diagnosis not present

## 2019-02-11 DIAGNOSIS — G4733 Obstructive sleep apnea (adult) (pediatric): Secondary | ICD-10-CM | POA: Insufficient documentation

## 2019-02-11 DIAGNOSIS — N189 Chronic kidney disease, unspecified: Secondary | ICD-10-CM | POA: Diagnosis not present

## 2019-02-11 DIAGNOSIS — E039 Hypothyroidism, unspecified: Secondary | ICD-10-CM | POA: Diagnosis not present

## 2019-02-11 DIAGNOSIS — Z887 Allergy status to serum and vaccine status: Secondary | ICD-10-CM | POA: Insufficient documentation

## 2019-02-11 DIAGNOSIS — Z801 Family history of malignant neoplasm of trachea, bronchus and lung: Secondary | ICD-10-CM | POA: Insufficient documentation

## 2019-02-11 DIAGNOSIS — Z808 Family history of malignant neoplasm of other organs or systems: Secondary | ICD-10-CM | POA: Insufficient documentation

## 2019-02-11 DIAGNOSIS — Z950 Presence of cardiac pacemaker: Secondary | ICD-10-CM | POA: Insufficient documentation

## 2019-02-11 DIAGNOSIS — Z800 Family history of malignant neoplasm of digestive organs: Secondary | ICD-10-CM | POA: Diagnosis not present

## 2019-02-11 DIAGNOSIS — Z5309 Procedure and treatment not carried out because of other contraindication: Secondary | ICD-10-CM | POA: Diagnosis present

## 2019-02-11 DIAGNOSIS — Z888 Allergy status to other drugs, medicaments and biological substances status: Secondary | ICD-10-CM | POA: Diagnosis not present

## 2019-02-11 DIAGNOSIS — Z952 Presence of prosthetic heart valve: Secondary | ICD-10-CM | POA: Insufficient documentation

## 2019-02-11 DIAGNOSIS — J449 Chronic obstructive pulmonary disease, unspecified: Secondary | ICD-10-CM | POA: Diagnosis not present

## 2019-02-11 DIAGNOSIS — Z803 Family history of malignant neoplasm of breast: Secondary | ICD-10-CM | POA: Insufficient documentation

## 2019-02-11 DIAGNOSIS — Z8249 Family history of ischemic heart disease and other diseases of the circulatory system: Secondary | ICD-10-CM | POA: Insufficient documentation

## 2019-02-11 DIAGNOSIS — E669 Obesity, unspecified: Secondary | ICD-10-CM | POA: Diagnosis not present

## 2019-02-11 DIAGNOSIS — Z87891 Personal history of nicotine dependence: Secondary | ICD-10-CM | POA: Insufficient documentation

## 2019-02-11 HISTORY — PX: EXTRACORPOREAL SHOCK WAVE LITHOTRIPSY: SHX1557

## 2019-02-11 SURGERY — LITHOTRIPSY, ESWL
Anesthesia: Moderate Sedation | Laterality: Right

## 2019-02-11 MED ORDER — ONDANSETRON HCL 4 MG/2ML IJ SOLN: 4.0000 mg | Freq: Once | INTRAMUSCULAR | Status: DC | PRN

## 2019-02-11 MED ORDER — DIPHENHYDRAMINE HCL 25 MG PO CAPS: 25.0000 mg | ORAL_CAPSULE | ORAL | Status: DC

## 2019-02-11 MED ORDER — DIAZEPAM 5 MG PO TABS: 10.0000 mg | ORAL_TABLET | ORAL | Status: DC

## 2019-02-11 MED ORDER — SODIUM CHLORIDE 0.9 % IV SOLN: INTRAVENOUS | Status: DC

## 2019-02-11 NOTE — Progress Notes (Signed)
Pt procedure canceled per Dr. Erlene Quan due to no cardiac clearance. Pt will need to call office to reschedule procedure.

## 2019-02-11 NOTE — Progress Notes (Signed)
Litho case canceled today as Mrs. Hindle has a pacemaker and requires cardiac clearance.  Her cardiologist is Dr. Humphrey Rolls whom she has not seen in over a year.  Hollice Espy, MD

## 2019-02-15 ENCOUNTER — Other Ambulatory Visit: Payer: Self-pay | Admitting: Urology

## 2019-02-15 DIAGNOSIS — N201 Calculus of ureter: Secondary | ICD-10-CM

## 2019-02-16 ENCOUNTER — Telehealth: Payer: Self-pay | Admitting: Urology

## 2019-02-16 ENCOUNTER — Other Ambulatory Visit
Admission: RE | Admit: 2019-02-16 | Discharge: 2019-02-16 | Disposition: A | Discharge status: Home / Self Care | Payer: Medicare Other | Source: Ambulatory Visit | Attending: Urology | Admitting: Urology

## 2019-02-16 ENCOUNTER — Other Ambulatory Visit: Payer: Self-pay

## 2019-02-16 DIAGNOSIS — Z20822 Contact with and (suspected) exposure to covid-19: Secondary | ICD-10-CM | POA: Diagnosis not present

## 2019-02-16 DIAGNOSIS — Z01812 Encounter for preprocedural laboratory examination: Secondary | ICD-10-CM | POA: Insufficient documentation

## 2019-02-16 NOTE — Telephone Encounter (Signed)
Dr. Laurelyn Sickle office will not clear patient without seeing her in office first and has not been able to reach the patient. I called multiple times today to reach the patient her mailbox is full on her cell phone. I finally reached someone today at 3:30pm on the home number. She identified herself as the  patients mother.  Pt.'s mother stated "pt. Is taking a nap and she was not going to wake her". I left her a message to call Dr. Laurelyn Sickle office ASAP and gave his office number. I also left a message asking the pt. To call our office as soon as she wakes up.If the pt. Does not call and get in to see Dr. Neoma Laming tomorrow 02/17/19 and cleared then her Lithotripsy will need to be rescheduled to 02/25/19.

## 2019-02-17 ENCOUNTER — Other Ambulatory Visit: Payer: Self-pay | Admitting: Urology

## 2019-02-17 DIAGNOSIS — N201 Calculus of ureter: Secondary | ICD-10-CM

## 2019-02-17 LAB — SARS CORONAVIRUS 2 (TAT 6-24 HRS): SARS Coronavirus 2: NEGATIVE

## 2019-02-17 NOTE — Telephone Encounter (Signed)
I called Dr. Laurelyn Sickle office to get this pt. An appointment. Dr. Humphrey Rolls is out of office on Wednesday's and they worked her in tomorrow morning 02/18/19 @ 10:30. I called the pt. Gave her this appointment and she voiced understanding.If cleared we will proceed with Lithotripsy tomorrow at 3:30pm.

## 2019-02-18 ENCOUNTER — Ambulatory Visit
Admission: RE | Admit: 2019-02-18 | Discharge: 2019-02-18 | Disposition: A | Discharge status: Home / Self Care | Payer: Medicare Other | Attending: Urology | Admitting: Urology

## 2019-02-18 ENCOUNTER — Other Ambulatory Visit: Payer: Self-pay

## 2019-02-18 ENCOUNTER — Ambulatory Visit: Payer: Medicare Other

## 2019-02-18 ENCOUNTER — Encounter
Admission: RE | Disposition: A | Discharge status: Home / Self Care | Payer: Self-pay | Source: Home / Self Care | Attending: Urology

## 2019-02-18 ENCOUNTER — Ambulatory Visit: Payer: Medicare Other | Admitting: Certified Registered"

## 2019-02-18 ENCOUNTER — Encounter: Payer: Self-pay | Admitting: Urology

## 2019-02-18 ENCOUNTER — Ambulatory Visit: Admit: 2019-02-18 | Payer: Medicare Other | Admitting: Urology

## 2019-02-18 DIAGNOSIS — E039 Hypothyroidism, unspecified: Secondary | ICD-10-CM | POA: Diagnosis not present

## 2019-02-18 DIAGNOSIS — K219 Gastro-esophageal reflux disease without esophagitis: Secondary | ICD-10-CM | POA: Diagnosis not present

## 2019-02-18 DIAGNOSIS — Z79899 Other long term (current) drug therapy: Secondary | ICD-10-CM | POA: Diagnosis not present

## 2019-02-18 DIAGNOSIS — Z79890 Hormone replacement therapy (postmenopausal): Secondary | ICD-10-CM | POA: Diagnosis not present

## 2019-02-18 DIAGNOSIS — E782 Mixed hyperlipidemia: Secondary | ICD-10-CM | POA: Diagnosis not present

## 2019-02-18 DIAGNOSIS — K7469 Other cirrhosis of liver: Secondary | ICD-10-CM | POA: Diagnosis not present

## 2019-02-18 DIAGNOSIS — N201 Calculus of ureter: Secondary | ICD-10-CM

## 2019-02-18 DIAGNOSIS — Z87891 Personal history of nicotine dependence: Secondary | ICD-10-CM | POA: Insufficient documentation

## 2019-02-18 DIAGNOSIS — G4730 Sleep apnea, unspecified: Secondary | ICD-10-CM | POA: Insufficient documentation

## 2019-02-18 DIAGNOSIS — E1122 Type 2 diabetes mellitus with diabetic chronic kidney disease: Secondary | ICD-10-CM | POA: Diagnosis not present

## 2019-02-18 DIAGNOSIS — Z6841 Body Mass Index (BMI) 40.0 and over, adult: Secondary | ICD-10-CM | POA: Diagnosis not present

## 2019-02-18 DIAGNOSIS — I130 Hypertensive heart and chronic kidney disease with heart failure and stage 1 through stage 4 chronic kidney disease, or unspecified chronic kidney disease: Secondary | ICD-10-CM | POA: Diagnosis not present

## 2019-02-18 DIAGNOSIS — Z7984 Long term (current) use of oral hypoglycemic drugs: Secondary | ICD-10-CM | POA: Diagnosis not present

## 2019-02-18 DIAGNOSIS — N189 Chronic kidney disease, unspecified: Secondary | ICD-10-CM | POA: Diagnosis not present

## 2019-02-18 DIAGNOSIS — J449 Chronic obstructive pulmonary disease, unspecified: Secondary | ICD-10-CM | POA: Insufficient documentation

## 2019-02-18 DIAGNOSIS — I5033 Acute on chronic diastolic (congestive) heart failure: Secondary | ICD-10-CM | POA: Insufficient documentation

## 2019-02-18 DIAGNOSIS — Z952 Presence of prosthetic heart valve: Secondary | ICD-10-CM | POA: Insufficient documentation

## 2019-02-18 HISTORY — PX: CYSTOSCOPY/URETEROSCOPY/HOLMIUM LASER/STENT PLACEMENT: SHX6546

## 2019-02-18 HISTORY — PX: CYSTOSCOPY W/ RETROGRADES: SHX1426

## 2019-02-18 HISTORY — PX: EXTRACORPOREAL SHOCK WAVE LITHOTRIPSY: SHX1557

## 2019-02-18 LAB — GLUCOSE, CAPILLARY
Glucose-Capillary: 84 mg/dL (ref 70–99)
Glucose-Capillary: 92 mg/dL (ref 70–99)

## 2019-02-18 SURGERY — LITHOTRIPSY, ESWL
Anesthesia: General | Laterality: Right

## 2019-02-18 SURGERY — CYSTOSCOPY/URETEROSCOPY/HOLMIUM LASER/STENT PLACEMENT
Anesthesia: General | Site: Ureter | Laterality: Right

## 2019-02-18 MED ORDER — ONDANSETRON HCL 4 MG/2ML IJ SOLN
INTRAMUSCULAR | Status: DC | PRN
  Administered 2019-02-18: 4 mg via INTRAVENOUS

## 2019-02-18 MED ORDER — DIPHENHYDRAMINE HCL 25 MG PO CAPS: 25.0000 mg | ORAL_CAPSULE | ORAL | Status: AC

## 2019-02-18 MED ORDER — PROPOFOL 10 MG/ML IV BOLUS
INTRAVENOUS | Status: AC
  Filled 2019-02-18: qty 20

## 2019-02-18 MED ORDER — FENTANYL CITRATE (PF) 100 MCG/2ML IJ SOLN
INTRAMUSCULAR | Status: AC
  Filled 2019-02-18: qty 2

## 2019-02-18 MED ORDER — OXYCODONE HCL 5 MG/5ML PO SOLN: 5.0000 mg | Freq: Once | ORAL | Status: DC | PRN

## 2019-02-18 MED ORDER — SODIUM CHLORIDE 0.9 % IV SOLN: INTRAVENOUS | Status: DC

## 2019-02-18 MED ORDER — OXYBUTYNIN CHLORIDE ER 10 MG PO TB24: 10.0000 mg | ORAL_TABLET | Freq: Every day | ORAL | 0 refills | Status: DC | PRN

## 2019-02-18 MED ORDER — DIAZEPAM 5 MG PO TABS
ORAL_TABLET | ORAL | Status: AC
  Administered 2019-02-18: 10 mg via ORAL
  Filled 2019-02-18: qty 2

## 2019-02-18 MED ORDER — CIPROFLOXACIN IN D5W 400 MG/200ML IV SOLN
INTRAVENOUS | Status: DC | PRN
  Administered 2019-02-18: 400 mg via INTRAVENOUS

## 2019-02-18 MED ORDER — DEXAMETHASONE SODIUM PHOSPHATE 10 MG/ML IJ SOLN
INTRAMUSCULAR | Status: AC
  Filled 2019-02-18: qty 1

## 2019-02-18 MED ORDER — ONDANSETRON HCL 4 MG/2ML IJ SOLN
INTRAMUSCULAR | Status: AC
  Filled 2019-02-18: qty 2

## 2019-02-18 MED ORDER — PROPOFOL 10 MG/ML IV BOLUS
INTRAVENOUS | Status: DC | PRN
  Administered 2019-02-18: 200 mg via INTRAVENOUS

## 2019-02-18 MED ORDER — ACETAMINOPHEN 10 MG/ML IV SOLN: 1000.0000 mg | Freq: Once | INTRAVENOUS | Status: DC | PRN

## 2019-02-18 MED ORDER — ROCURONIUM BROMIDE 50 MG/5ML IV SOLN
INTRAVENOUS | Status: AC
  Filled 2019-02-18: qty 1

## 2019-02-18 MED ORDER — ONDANSETRON HCL 4 MG/2ML IJ SOLN: 4.0000 mg | Freq: Once | INTRAMUSCULAR | Status: AC

## 2019-02-18 MED ORDER — FENTANYL CITRATE (PF) 100 MCG/2ML IJ SOLN: 25.0000 ug | INTRAMUSCULAR | Status: DC | PRN

## 2019-02-18 MED ORDER — LIDOCAINE HCL (PF) 2 % IJ SOLN
INTRAMUSCULAR | Status: AC
  Filled 2019-02-18: qty 5

## 2019-02-18 MED ORDER — EPHEDRINE SULFATE 50 MG/ML IJ SOLN
INTRAMUSCULAR | Status: AC
  Filled 2019-02-18: qty 1

## 2019-02-18 MED ORDER — OXYCODONE HCL 5 MG PO TABS: 5.0000 mg | ORAL_TABLET | Freq: Once | ORAL | Status: DC | PRN

## 2019-02-18 MED ORDER — DIPHENHYDRAMINE HCL 25 MG PO CAPS
ORAL_CAPSULE | ORAL | Status: AC
  Administered 2019-02-18: 25 mg via ORAL
  Filled 2019-02-18: qty 1

## 2019-02-18 MED ORDER — DIAZEPAM 5 MG PO TABS: 10.0000 mg | ORAL_TABLET | ORAL | Status: AC

## 2019-02-18 MED ORDER — IPRATROPIUM-ALBUTEROL 0.5-2.5 (3) MG/3ML IN SOLN
RESPIRATORY_TRACT | Status: AC
  Filled 2019-02-18: qty 3

## 2019-02-18 MED ORDER — ONDANSETRON HCL 4 MG/2ML IJ SOLN
INTRAMUSCULAR | Status: AC
  Administered 2019-02-18: 4 mg via INTRAVENOUS
  Filled 2019-02-18: qty 2

## 2019-02-18 MED ORDER — BELLADONNA ALKALOIDS-OPIUM 16.2-60 MG RE SUPP
RECTAL | Status: AC
  Filled 2019-02-18: qty 1

## 2019-02-18 MED ORDER — CIPROFLOXACIN IN D5W 400 MG/200ML IV SOLN: 400.0000 mg | INTRAVENOUS | Status: DC

## 2019-02-18 MED ORDER — DEXAMETHASONE SODIUM PHOSPHATE 10 MG/ML IJ SOLN
INTRAMUSCULAR | Status: DC | PRN
  Administered 2019-02-18: 5 mg via INTRAVENOUS

## 2019-02-18 MED ORDER — SUCCINYLCHOLINE CHLORIDE 20 MG/ML IJ SOLN
INTRAMUSCULAR | Status: DC | PRN
  Administered 2019-02-18: 150 mg via INTRAVENOUS

## 2019-02-18 MED ORDER — BELLADONNA ALKALOIDS-OPIUM 16.2-60 MG RE SUPP
RECTAL | Status: DC | PRN
  Administered 2019-02-18: 1 via RECTAL

## 2019-02-18 MED ORDER — LIDOCAINE HCL (CARDIAC) PF 100 MG/5ML IV SOSY
PREFILLED_SYRINGE | INTRAVENOUS | Status: DC | PRN
  Administered 2019-02-18: 100 mg via INTRAVENOUS

## 2019-02-18 MED ORDER — IOHEXOL 180 MG/ML  SOLN
INTRAMUSCULAR | Status: DC | PRN
  Administered 2019-02-18: 20 mL

## 2019-02-18 MED ORDER — TAMSULOSIN HCL 0.4 MG PO CAPS: 0.4000 mg | ORAL_CAPSULE | Freq: Every day | ORAL | 0 refills | Status: DC

## 2019-02-18 MED ORDER — FENTANYL CITRATE (PF) 100 MCG/2ML IJ SOLN
INTRAMUSCULAR | Status: DC | PRN
  Administered 2019-02-18: 50 ug via INTRAVENOUS

## 2019-02-18 MED ORDER — SUCCINYLCHOLINE CHLORIDE 20 MG/ML IJ SOLN
INTRAMUSCULAR | Status: AC
  Filled 2019-02-18: qty 1

## 2019-02-18 MED ORDER — CIPROFLOXACIN IN D5W 400 MG/200ML IV SOLN
INTRAVENOUS | Status: AC
  Filled 2019-02-18: qty 200

## 2019-02-18 MED ORDER — LACTATED RINGERS IV SOLN: INTRAVENOUS | Status: DC | PRN

## 2019-02-18 MED ORDER — ONDANSETRON HCL 4 MG/2ML IJ SOLN: 4.0000 mg | Freq: Once | INTRAMUSCULAR | Status: DC | PRN

## 2019-02-18 SURGICAL SUPPLY — 30 items
BAG DRAIN CYSTO-URO LG1000N (MISCELLANEOUS) ×3 IMPLANT
BASKET ZERO TIP 1.9FR (BASKET) ×3 IMPLANT
BRUSH SCRUB EZ 1% IODOPHOR (MISCELLANEOUS) ×3 IMPLANT
CATH URETL 5X70 OPEN END (CATHETERS) ×3 IMPLANT
CNTNR SPEC 2.5X3XGRAD LEK (MISCELLANEOUS) ×1
CONT SPEC 4OZ STER OR WHT (MISCELLANEOUS) ×2
CONTAINER SPEC 2.5X3XGRAD LEK (MISCELLANEOUS) ×1 IMPLANT
DRAPE UTILITY 15X26 TOWEL STRL (DRAPES) ×3 IMPLANT
FIBER LASER TRAC TIP (UROLOGICAL SUPPLIES) ×3 IMPLANT
GLOVE BIOGEL PI IND STRL 7.5 (GLOVE) ×1 IMPLANT
GLOVE BIOGEL PI INDICATOR 7.5 (GLOVE) ×2
GOWN STRL REUS W/ TWL LRG LVL3 (GOWN DISPOSABLE) ×1 IMPLANT
GOWN STRL REUS W/ TWL XL LVL3 (GOWN DISPOSABLE) ×1 IMPLANT
GOWN STRL REUS W/TWL LRG LVL3 (GOWN DISPOSABLE) ×2
GOWN STRL REUS W/TWL XL LVL3 (GOWN DISPOSABLE) ×2
GUIDEWIRE STR DUAL SENSOR (WIRE) ×6 IMPLANT
INFUSOR MANOMETER BAG 3000ML (MISCELLANEOUS) ×3 IMPLANT
INTRODUCER DILATOR DOUBLE (INTRODUCER) ×3 IMPLANT
KIT TURNOVER CYSTO (KITS) ×3 IMPLANT
PACK CYSTO AR (MISCELLANEOUS) ×3 IMPLANT
SET CYSTO W/LG BORE CLAMP LF (SET/KITS/TRAYS/PACK) ×3 IMPLANT
SHEATH URETERAL 12FRX35CM (MISCELLANEOUS) ×3 IMPLANT
SOL .9 NS 3000ML IRR  AL (IV SOLUTION) ×2
SOL .9 NS 3000ML IRR UROMATIC (IV SOLUTION) ×1 IMPLANT
STENT URET 6FRX24 CONTOUR (STENTS) IMPLANT
STENT URET 6FRX26 CONTOUR (STENTS) ×3 IMPLANT
SURGILUBE 2OZ TUBE FLIPTOP (MISCELLANEOUS) ×3 IMPLANT
SYR 10ML LL (SYRINGE) ×3 IMPLANT
VALVE UROSEAL ADJ ENDO (VALVE) ×3 IMPLANT
WATER STERILE IRR 1000ML POUR (IV SOLUTION) ×3 IMPLANT

## 2019-02-18 NOTE — Discharge Instructions (Addendum)

## 2019-02-18 NOTE — Anesthesia Postprocedure Evaluation (Signed)
Anesthesia Post Note  Patient: Theresa Leon  Procedure(s) Performed: CYSTOSCOPY/URETEROSCOPY/HOLMIUM LASER/STENT PLACEMENT (Right Ureter) CYSTOSCOPY WITH RETROGRADE PYELOGRAM (Right Ureter) EXTRACORPOREAL SHOCK WAVE LITHOTRIPSY (ESWL) (Right )  Patient location during evaluation: PACU Anesthesia Type: General Level of consciousness: awake and alert Pain management: pain level controlled Vital Signs Assessment: post-procedure vital signs reviewed and stable Respiratory status: spontaneous breathing, nonlabored ventilation, respiratory function stable and patient connected to nasal cannula oxygen Cardiovascular status: blood pressure returned to baseline and stable Postop Assessment: no apparent nausea or vomiting Anesthetic complications: no     Last Vitals:  Vitals:   02/18/19 1743 02/18/19 1758  BP: (!) 148/89 (!) 118/94  Pulse: 81 75  Resp: 18 15  Temp:    SpO2: (!) 85% 98%    Last Pain:  Vitals:   02/18/19 1758  TempSrc:   PainSc: 0-No pain                 Arita Miss

## 2019-02-18 NOTE — Progress Notes (Signed)
Pt transferred to [facility] via wheelchair from the lithotripsy truck. Pt awake and Ox3, report received from [place] to proceed with the lithotripsy d/t decreased oxygen saturation. Pt received .54m versed and 279m fentanyl before the attempt of the lithotripsy. The lithotripsy cancelled per Dr SnDiamantina Providenceroceed to surgery. Will obtain surgical consent from sister d/t pt receiving sedation. Pt stated not to get surgical consent from mother d/t mother's mental status. Pt instructed RN's to get consent from sister, DiJoanie CoddingtonTelephone consent obtained from sister DiJoanie Coddingtonrom Dr ZaBertell Mariand 2 RN's KaEarnest Baileynd NiLauretta Chester

## 2019-02-18 NOTE — Anesthesia Preprocedure Evaluation (Signed)
Anesthesia Evaluation  Patient identified by MRN, date of birth, ID band Patient awake  General Assessment Comment:Patient awake and alert, but received midazolam earlier for attempt at moderate sedation. Conducted anesthesia preop with patient, as well as with her HCP sister Joanie Coddington. Witnessed by RN Santiago Glad  Reviewed: Allergy & Precautions, NPO status , Patient's Chart, lab work & pertinent test results  History of Anesthesia Complications Negative for: history of anesthetic complications  Airway Mallampati: III  TM Distance: >3 FB Neck ROM: Full    Dental no notable dental hx. (+) Teeth Intact, Partial Lower, Partial Upper, Dental Advisory Given   Pulmonary neg pulmonary ROS, asthma , sleep apnea and Continuous Positive Airway Pressure Ventilation , COPD, Patient abstained from smoking.Not current smoker, former smoker,  Prior tracheostomy in 2016, on ventilator for 6 months after an episode of acute renal failure.    Pulmonary exam normal breath sounds clear to auscultation + decreased breath sounds      Cardiovascular Exercise Tolerance: Poor METS(-) hypertension(-) CAD and (-) Past MI (-) dysrhythmias  Rhythm:Regular Rate:Normal - Systolic murmurs    Neuro/Psych negative neurological ROS  negative psych ROS   GI/Hepatic GERD  ,(+) Cirrhosis     (-) substance abuse  , NASH cirrhosis   Endo/Other  diabetes  Renal/GU CRFRenal disease     Musculoskeletal   Abdominal (+) + obese,   Peds  Hematology   Anesthesia Other Findings Past Medical History: No date: Acute on chronic renal failure (HCC) No date: Anemia 1975: Asthma No date: COPD (chronic obstructive pulmonary disease) ([place]) No date: Diabetes ([place]) 2012: Diabetes mellitus without complication (HCC) No date: Diastolic CHF, acute on chronic (HCC) No date: H/O aortic valve replacement No date: Heart disease No date: Hypothyroid No date: Kidney stone No  date: Liver cirrhosis secondary to NASH (nonalcoholic  steatohepatitis) ([place]) No date: Morbid obesity ([place]) 2013: 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, left breast  No date: OSA (obstructive sleep apnea)  Reproductive/Obstetrics                             Anesthesia Physical Anesthesia Plan  ASA: III  Anesthesia Plan: General   Post-op Pain Management:    Induction: Intravenous  PONV Risk Score and Plan: 3 and Ondansetron and Dexamethasone  Airway Management Planned: Oral ETT  Additional Equipment: None  Intra-op Plan:   Post-operative Plan: Extubation in OR  Informed Consent: I have reviewed the patients History and Physical, chart, labs and discussed the procedure including the risks, benefits and alternatives for the proposed anesthesia with the patient or authorized representative who has indicated his/her understanding and acceptance.     Dental advisory given  Plan Discussed with: CRNA and Surgeon  Anesthesia Plan Comments: (Discussed risks of anesthesia with patient, including PONV, sore throat, lip/dental damage. Rare risks discussed as well, such as cardiorespiratory sequelae, difficulty with airway management, prolonged intubation. Patient understands. Patient counseled on being high risk for anesthesia. Patient was told about increased risk of cardiac and respiratory events, including death. Patient understands. Patient informed about increased incidence of above perioperative risk due to high BMI. Patient understands. )        Anesthesia Quick Evaluation

## 2019-02-18 NOTE — Transfer of Care (Signed)
Immediate Anesthesia Transfer of Care Note  Patient: Theresa Leon  Procedure(s) Performed: CYSTOSCOPY/URETEROSCOPY/HOLMIUM LASER/STENT PLACEMENT (Right Ureter) CYSTOSCOPY WITH RETROGRADE PYELOGRAM (Right Ureter) EXTRACORPOREAL SHOCK WAVE LITHOTRIPSY (ESWL) (Right )  Patient Location: PACU  Anesthesia Type:General  Level of Consciousness: awake, oriented and drowsy  Airway & Oxygen Therapy: Patient Spontanous Breathing and Patient connected to face mask oxygen  Post-op Assessment: Report given to RN and Post -op Vital signs reviewed and stable  Post vital signs: Reviewed and stable  Last Vitals:  Vitals Value Taken Time  BP 147/95 02/18/19 1728  Temp    Pulse 86 02/18/19 1733  Resp 11 02/18/19 1733  SpO2 96 % 02/18/19 1733  Vitals shown include unvalidated device data.  Last Pain:  Vitals:   02/18/19 1406  TempSrc: Tympanic  PainSc: 3       Patients Stated Pain Goal: 0 (37/62/83 1517)  Complications: No apparent anesthesia complications

## 2019-02-18 NOTE — Op Note (Signed)
Date of procedure: 02/18/19  Preoperative diagnosis:  1. Right 1 cm proximal ureteral stone  Postoperative diagnosis:  1. Same  Procedure: 1. Cystoscopy, right ureteroscopy, attempted laser lithotripsy, right retrograde pyelogram with intraoperative interpretation, right ureteral stent placement  Surgeon: Nickolas Madrid, MD  Anesthesia: General  Complications: None  Intraoperative findings:  1.  Normal cystoscopy 2.  Acute angle from UPJ into the renal pelvis with limited mobility, unable to access stone safely 3.  Uncomplicated right ureteral stent placement  EBL: Minimal  Specimens: None  Drains: Right 6 French by 26 cm ureteral stent  Indication: Theresa Leon is a 64 y.o. patient with a number of comorbidities and a 1 cm right ureteropelvic junction stone.  She had originally elected shockwave lithotripsy for treatment of her right ureteral stone, however was unable to tolerate the flat position and we had difficulty maintaining her oxygen saturations prior to even initiating treatment.  At this point we elected to proceed with right ureteroscopy, laser lithotripsy, and stent placement.  After reviewing the management options for treatment, they elected to proceed with the above surgical procedure(s). We have discussed the potential benefits and risks of the procedure, side effects of the proposed treatment, the likelihood of the patient achieving the goals of the procedure, and any potential problems that might occur during the procedure or recuperation. Informed consent has been obtained.  Description of procedure:  The patient was taken to the operating room and general anesthesia was induced. SCDs were placed for DVT prophylaxis. The patient was placed in the dorsal lithotomy position, prepped and draped in the usual sterile fashion, and preoperative antibiotics(Cipro) were administered. A preoperative time-out was performed.   A 21 French rigid cystoscope was used to  intubate the urethra.  Thorough cystoscopy was performed and was normal.  The ureteral orifices were orthotopic bilaterally.  A retrograde pyelogram was performed on the right side showing a filling defect in the right proximal ureter consistent with her known stone.  There was an acute angle down and into the collecting system from the UPJ.  A sensor wire was advanced up into the kidney under fluoroscopic vision.  A dual-lumen access catheter was used to add a second safety wire.  I attempted to pass the single-channel flexible ureteroscope over the wire, however met resistance in the mid ureter.  Under direct vision it appeared that the safety wire was hindering progress of the ureteroscope in addition to a narrow ureter.  The extra wire was removed, and I was able to advance the ureteroscope over the wire up into the kidney under fluoroscopic vision.  The proximal ureteral stone had moved back into the renal pelvis.  There was mild bleeding in vision was not ideal.  There was no evidence of purulence.  The 1 cm yellow stone was visualized and I was able to fragment a small portion using the 200 m laser fiber on settings of 0.5 J and 20 Hz, however the stone bounced into an upper pole location.  I attempted to navigate into this area with the scope for over 20 minutes, as well as tried to basket the stone back into a more manageable area, however the mobility of the scope was extremely limited likely secondary to her tight ureter and obesity.  At this point I elected to try to place a ureteral access sheath to see if this would improve the mobility the scope and allow me to treat the stone.  A sensor wire was advanced through the  flexible ureteroscope and the ureteroscope was removed.  Notably the UPJ was quite tight and narrow.  The dual-lumen access catheter was again used at the safety wire.  I attempted to pass the 12/14 French ureteral access sheath over the wire, however met resistance in the distal to mid  ureter.  The access sheath was removed.  Another retrograde pyelogram was performed that confirmed placement of our wire in the renal pelvis and no extravasation.  I again attempted to pass the single-channel flexible ureteroscope over the wire, but met resistance in the mid ureter.  I did not feel comfortable attempting to pass the ureteroscope over a solitary wire without a safety wire in the setting of her very tight UPJ and challenging stone.  At this point I elected to place a ureteral stent for passive dilation and return in 2 to 3 weeks for definitive stone management.  The rigid cystoscope was backloaded over the wire and a 6 [place] by 26 cm stent was uneventfully passed over the wire with an excellent curl in the renal pelvis as well as in the bladder.  Under direct vision, there is brisk drainage of contrast and fluid through the stent.  The bladder was drained.  A belladonna suppository was placed.  Disposition: Stable to PACU  Plan: Follow-up in 2 to 3 weeks for repeat ureteroscopy for definitive management of her right sided stone  Nickolas Madrid, MD

## 2019-02-18 NOTE — Anesthesia Procedure Notes (Signed)
Procedure Name: Intubation Date/Time: 02/18/2019 4:15 PM Performed by: Jerrye Noble, CRNA Pre-anesthesia Checklist: Patient identified, Emergency Drugs available, Suction available and Patient being monitored Patient Re-evaluated:Patient Re-evaluated prior to induction Oxygen Delivery Method: Circle system utilized Preoxygenation: Pre-oxygenation with 100% oxygen Induction Type: IV induction and Rapid sequence Laryngoscope Size: McGraph and 4 Tube type: Oral Tube size: 7.0 mm Number of attempts: 1 Airway Equipment and Method: Stylet and Oral airway Placement Confirmation: ETT inserted through vocal cords under direct vision,  positive ETCO2 and breath sounds checked- equal and bilateral Secured at: 21 cm Tube secured with: Tape Dental Injury: Teeth and Oropharynx as per pre-operative assessment

## 2019-02-18 NOTE — Progress Notes (Signed)
UROLOGY PROGRESS NOTE  Unable to maintain O2 sats on SWL truck and have not even started treatment, she is also moving significantly making stone targeting difficult. I feel safest option for her is right ureteroscopy, laser lithotripsy, and stent placement tonight.  I called her sister and specifically discussed the risks ureteroscopy including bleeding, infection/sepsis, stent related symptoms including flank pain/urgency/frequency/incontinence/dysuria, ureteral injury, inability to access stone, or need for staged or additional procedures.  Nickolas Madrid, MD 02/18/2019

## 2019-02-22 ENCOUNTER — Other Ambulatory Visit: Payer: Self-pay | Admitting: Urology

## 2019-02-22 DIAGNOSIS — N201 Calculus of ureter: Secondary | ICD-10-CM

## 2019-02-23 ENCOUNTER — Other Ambulatory Visit: Payer: Medicare Other

## 2019-02-23 ENCOUNTER — Other Ambulatory Visit: Payer: Self-pay

## 2019-02-23 DIAGNOSIS — N201 Calculus of ureter: Secondary | ICD-10-CM

## 2019-02-24 LAB — URINALYSIS, COMPLETE
Bilirubin, UA: NEGATIVE
Ketones, UA: NEGATIVE
Nitrite, UA: NEGATIVE
Specific Gravity, UA: 1.025 (ref 1.005–1.030)
Urobilinogen, Ur: 1 mg/dL (ref 0.2–1.0)
pH, UA: 6 (ref 5.0–7.5)

## 2019-02-24 LAB — MICROSCOPIC EXAMINATION: RBC, Urine: >30 /hpf — AB (ref 0–2)

## 2019-02-26 LAB — CULTURE, URINE COMPREHENSIVE

## 2019-02-28 ENCOUNTER — Other Ambulatory Visit: Payer: Self-pay | Admitting: Urology

## 2019-03-01 ENCOUNTER — Other Ambulatory Visit: Payer: Self-pay

## 2019-03-01 ENCOUNTER — Encounter
Admission: RE | Admit: 2019-03-01 | Discharge: 2019-03-01 | Disposition: A | Discharge status: Home / Self Care | Payer: Medicare Other | Source: Ambulatory Visit | Attending: Urology | Admitting: Urology

## 2019-03-01 HISTORY — DX: Cardiac murmur, unspecified: R01.1

## 2019-03-01 HISTORY — DX: Presence of cardiac pacemaker: Z95.0

## 2019-03-01 NOTE — Patient Instructions (Signed)
Your procedure is scheduled on: Friday 2/5 Report to Day Surgery. To find out your arrival time please call [phone] between 1PM - 3PM on thurs 2/4.  Remember: Instructions that are not followed completely may result in serious medical risk,  up to and including death, or upon the discretion of your surgeon and anesthesiologist your  surgery may need to be rescheduled.     _X__ 1. Do not eat food after midnight the night before your procedure.                 No gum chewing or hard candies. You may drink clear liquids up to 2 hours                 before you are scheduled to arrive for your surgery- DO not drink clear                 liquids within 2 hours of the start of your surgery.                 Clear Liquids include:  water, Black Coffee or Tea (Do not add                 anything to coffee or tea).  __X__2.  On the morning of surgery brush your teeth with toothpaste and water, you                may rinse your mouth with mouthwash if you wish.  Do not swallow any toothpaste of mouthwash.     _X__ 3.  No Alcohol for 24 hours before or after surgery.   ___ 4.  Do Not Smoke or use e-cigarettes For 24 Hours Prior to Your Surgery.                 Do not use any chewable tobacco products for at least 6 hours prior to                 surgery.  ____  5.  Bring all medications with you on the day of surgery if instructed.   __x__  6.  Notify your doctor if there is any change in your medical condition      (cold, fever, infections).     Do not wear jewelry, make-up, hairpins, clips or nail polish. Do not wear lotions, powders, or perfumes. You may wear deodorant. Do not shave 48 hours prior to surgery. Men may shave face and neck. Do not bring valuables to the hospital.    [facility] is not responsible for any belongings or valuables.  Contacts, dentures or bridgework may not be worn into surgery. Leave your suitcase in the car. After surgery it may  be brought to your room. For patients admitted to the hospital, discharge time is determined by your treatment team.   Patients discharged the day of surgery will not be allowed to drive home.   Please read over the following fact sheets that you were given:    __x__ Take these medicines the morning of surgery with A SIP OF WATER:    1. citalopram (CELEXA) 40 MG tablet  2. DULoxetine (CYMBALTA) 30 MG capsule  3. gabapentin (NEURONTIN) 600 MG tablet  4.levothyroxine (SYNTHROID, LEVOTHROID) 125 MCG tablet  5.oxybutynin (DITROPAN-XL) 10 MG 24 hr tablet if needed  6.XTAMPZA ER 9 MG C12A   ____ Fleet Enema (as directed)   _x___ shower the night before and the morning of surgery  Clean  sheet on the bed   __x__ Use inhalers on the day of surgery  albuterol (PROVENTIL HFA;VENTOLIN HFA) 108 (90 BASE) MCG/ACT inhaler and bring with you  __x__ Stop metformin 2 days prior to surgery Last dose on evening of 2/2   ____ Take 1/2 of usual insulin dose the night before surgery. No insulin the morning          of surgery.   ____ Stop Coumadin/Plavix/aspirin on   __x__ Stop Anti-inflammatories no ibuprofen aleve or aspirin   May take tylenol   ____ Stop supplements until after surgery.    _x___  Use C-Pap during the day the day of surgery

## 2019-03-03 ENCOUNTER — Other Ambulatory Visit
Admission: RE | Admit: 2019-03-03 | Discharge: 2019-03-03 | Disposition: A | Discharge status: Home / Self Care | Payer: Medicare Other | Source: Ambulatory Visit | Attending: Urology | Admitting: Urology

## 2019-03-03 DIAGNOSIS — Z01812 Encounter for preprocedural laboratory examination: Secondary | ICD-10-CM | POA: Insufficient documentation

## 2019-03-03 DIAGNOSIS — Z20822 Contact with and (suspected) exposure to covid-19: Secondary | ICD-10-CM | POA: Insufficient documentation

## 2019-03-03 LAB — SARS CORONAVIRUS 2 (TAT 6-24 HRS): SARS Coronavirus 2: NEGATIVE

## 2019-03-04 ENCOUNTER — Ambulatory Visit: Payer: Medicare Other | Admitting: Urology

## 2019-03-04 MED ORDER — CIPROFLOXACIN IN D5W 400 MG/200ML IV SOLN
400.0000 mg | INTRAVENOUS | Status: AC
  Administered 2019-03-05: 400 mg via INTRAVENOUS

## 2019-03-05 ENCOUNTER — Ambulatory Visit: Payer: Medicare Other

## 2019-03-05 ENCOUNTER — Other Ambulatory Visit: Payer: Self-pay

## 2019-03-05 ENCOUNTER — Ambulatory Visit: Payer: Medicare Other | Admitting: Anesthesiology

## 2019-03-05 ENCOUNTER — Ambulatory Visit
Admission: RE | Admit: 2019-03-05 | Discharge: 2019-03-05 | Disposition: A | Discharge status: Home / Self Care | Payer: Medicare Other | Attending: Urology | Admitting: Urology

## 2019-03-05 ENCOUNTER — Encounter
Admission: RE | Disposition: A | Discharge status: Home / Self Care | Payer: Self-pay | Source: Home / Self Care | Attending: Urology

## 2019-03-05 ENCOUNTER — Encounter: Payer: Self-pay | Admitting: Urology

## 2019-03-05 DIAGNOSIS — E039 Hypothyroidism, unspecified: Secondary | ICD-10-CM | POA: Diagnosis not present

## 2019-03-05 DIAGNOSIS — K219 Gastro-esophageal reflux disease without esophagitis: Secondary | ICD-10-CM | POA: Diagnosis not present

## 2019-03-05 DIAGNOSIS — Z9049 Acquired absence of other specified parts of digestive tract: Secondary | ICD-10-CM | POA: Diagnosis not present

## 2019-03-05 DIAGNOSIS — N189 Chronic kidney disease, unspecified: Secondary | ICD-10-CM | POA: Diagnosis not present

## 2019-03-05 DIAGNOSIS — Z87442 Personal history of urinary calculi: Secondary | ICD-10-CM | POA: Insufficient documentation

## 2019-03-05 DIAGNOSIS — K7581 Nonalcoholic steatohepatitis (NASH): Secondary | ICD-10-CM | POA: Diagnosis not present

## 2019-03-05 DIAGNOSIS — G4733 Obstructive sleep apnea (adult) (pediatric): Secondary | ICD-10-CM | POA: Insufficient documentation

## 2019-03-05 DIAGNOSIS — D631 Anemia in chronic kidney disease: Secondary | ICD-10-CM | POA: Diagnosis not present

## 2019-03-05 DIAGNOSIS — Z950 Presence of cardiac pacemaker: Secondary | ICD-10-CM | POA: Insufficient documentation

## 2019-03-05 DIAGNOSIS — I5033 Acute on chronic diastolic (congestive) heart failure: Secondary | ICD-10-CM | POA: Diagnosis not present

## 2019-03-05 DIAGNOSIS — D696 Thrombocytopenia, unspecified: Secondary | ICD-10-CM | POA: Diagnosis not present

## 2019-03-05 DIAGNOSIS — J449 Chronic obstructive pulmonary disease, unspecified: Secondary | ICD-10-CM | POA: Insufficient documentation

## 2019-03-05 DIAGNOSIS — Z6841 Body Mass Index (BMI) 40.0 and over, adult: Secondary | ICD-10-CM | POA: Diagnosis not present

## 2019-03-05 DIAGNOSIS — N201 Calculus of ureter: Secondary | ICD-10-CM

## 2019-03-05 DIAGNOSIS — F17200 Nicotine dependence, unspecified, uncomplicated: Secondary | ICD-10-CM | POA: Insufficient documentation

## 2019-03-05 HISTORY — PX: CYSTOSCOPY W/ RETROGRADES: SHX1426

## 2019-03-05 HISTORY — PX: CYSTOSCOPY/URETEROSCOPY/HOLMIUM LASER/STENT PLACEMENT: SHX6546

## 2019-03-05 LAB — GLUCOSE, CAPILLARY
Glucose-Capillary: 128 mg/dL — ABNORMAL HIGH (ref 70–99)
Glucose-Capillary: 121 mg/dL — ABNORMAL HIGH (ref 70–99)

## 2019-03-05 SURGERY — CYSTOSCOPY/URETEROSCOPY/HOLMIUM LASER/STENT PLACEMENT
Anesthesia: General | Site: Ureter | Laterality: Right

## 2019-03-05 MED ORDER — BELLADONNA ALKALOIDS-OPIUM 16.2-60 MG RE SUPP
RECTAL | Status: AC
  Filled 2019-03-05: qty 1

## 2019-03-05 MED ORDER — EPHEDRINE SULFATE 50 MG/ML IJ SOLN
INTRAMUSCULAR | Status: AC
  Filled 2019-03-05: qty 1

## 2019-03-05 MED ORDER — FENTANYL CITRATE (PF) 100 MCG/2ML IJ SOLN: 25.0000 ug | INTRAMUSCULAR | Status: DC | PRN

## 2019-03-05 MED ORDER — ROCURONIUM BROMIDE 100 MG/10ML IV SOLN
INTRAVENOUS | Status: DC | PRN
  Administered 2019-03-05: 30 mg via INTRAVENOUS
  Administered 2019-03-05 (×2): 10 mg via INTRAVENOUS

## 2019-03-05 MED ORDER — SUGAMMADEX SODIUM 500 MG/5ML IV SOLN
INTRAVENOUS | Status: DC | PRN
  Administered 2019-03-05: 400 mg via INTRAVENOUS

## 2019-03-05 MED ORDER — ACETAMINOPHEN 10 MG/ML IV SOLN
INTRAVENOUS | Status: DC | PRN
  Administered 2019-03-05: 1000 mg via INTRAVENOUS

## 2019-03-05 MED ORDER — CIPROFLOXACIN IN D5W 400 MG/200ML IV SOLN
INTRAVENOUS | Status: AC
  Filled 2019-03-05: qty 200

## 2019-03-05 MED ORDER — SUGAMMADEX SODIUM 500 MG/5ML IV SOLN
INTRAVENOUS | Status: AC
  Filled 2019-03-05: qty 5

## 2019-03-05 MED ORDER — SULFAMETHOXAZOLE-TRIMETHOPRIM 800-160 MG PO TABS: 1.0000 | ORAL_TABLET | Freq: Every day | ORAL | 0 refills | Status: DC

## 2019-03-05 MED ORDER — ACETAMINOPHEN 10 MG/ML IV SOLN
INTRAVENOUS | Status: AC
  Filled 2019-03-05: qty 100

## 2019-03-05 MED ORDER — IOHEXOL 180 MG/ML  SOLN
INTRAMUSCULAR | Status: DC | PRN
  Administered 2019-03-05: 20 mL

## 2019-03-05 MED ORDER — PROPOFOL 10 MG/ML IV BOLUS
INTRAVENOUS | Status: DC | PRN
  Administered 2019-03-05: 150 ug via INTRAVENOUS

## 2019-03-05 MED ORDER — SUCCINYLCHOLINE CHLORIDE 20 MG/ML IJ SOLN
INTRAMUSCULAR | Status: DC | PRN
  Administered 2019-03-05: 160 mg via INTRAVENOUS

## 2019-03-05 MED ORDER — ONDANSETRON HCL 4 MG/2ML IJ SOLN
INTRAMUSCULAR | Status: DC | PRN
  Administered 2019-03-05: 4 mg via INTRAVENOUS

## 2019-03-05 MED ORDER — FENTANYL CITRATE (PF) 100 MCG/2ML IJ SOLN
INTRAMUSCULAR | Status: AC
  Filled 2019-03-05: qty 2

## 2019-03-05 MED ORDER — FAMOTIDINE 20 MG PO TABS
ORAL_TABLET | ORAL | Status: AC
  Administered 2019-03-05: 20 mg via ORAL
  Filled 2019-03-05: qty 1

## 2019-03-05 MED ORDER — FENTANYL CITRATE (PF) 100 MCG/2ML IJ SOLN
INTRAMUSCULAR | Status: DC | PRN
  Administered 2019-03-05: 50 ug via INTRAVENOUS

## 2019-03-05 MED ORDER — FAMOTIDINE 20 MG PO TABS: 20.0000 mg | ORAL_TABLET | Freq: Once | ORAL | Status: AC

## 2019-03-05 MED ORDER — DEXAMETHASONE SODIUM PHOSPHATE 10 MG/ML IJ SOLN
INTRAMUSCULAR | Status: DC | PRN
  Administered 2019-03-05: 5 mg via INTRAVENOUS

## 2019-03-05 MED ORDER — PHENYLEPHRINE HCL (PRESSORS) 10 MG/ML IV SOLN
INTRAVENOUS | Status: DC | PRN
  Administered 2019-03-05: 100 ug via INTRAVENOUS
  Administered 2019-03-05: 150 ug via INTRAVENOUS
  Administered 2019-03-05 (×2): 100 ug via INTRAVENOUS

## 2019-03-05 MED ORDER — TAMSULOSIN HCL 0.4 MG PO CAPS: 0.4000 mg | ORAL_CAPSULE | Freq: Every day | ORAL | 0 refills | Status: DC

## 2019-03-05 MED ORDER — MIDAZOLAM HCL 2 MG/2ML IJ SOLN
INTRAMUSCULAR | Status: AC
  Filled 2019-03-05: qty 2

## 2019-03-05 MED ORDER — DEXAMETHASONE SODIUM PHOSPHATE 10 MG/ML IJ SOLN
INTRAMUSCULAR | Status: AC
  Filled 2019-03-05: qty 1

## 2019-03-05 MED ORDER — ONDANSETRON HCL 4 MG/2ML IJ SOLN
INTRAMUSCULAR | Status: AC
  Filled 2019-03-05: qty 2

## 2019-03-05 MED ORDER — KETOROLAC TROMETHAMINE 30 MG/ML IJ SOLN
INTRAMUSCULAR | Status: DC | PRN
  Administered 2019-03-05: 30 mg via INTRAVENOUS

## 2019-03-05 MED ORDER — LACTATED RINGERS IV SOLN: INTRAVENOUS | Status: DC | PRN

## 2019-03-05 MED ORDER — SEVOFLURANE IN SOLN
RESPIRATORY_TRACT | Status: AC
  Filled 2019-03-05: qty 250

## 2019-03-05 MED ORDER — PROPOFOL 10 MG/ML IV BOLUS
INTRAVENOUS | Status: AC
  Filled 2019-03-05: qty 20

## 2019-03-05 MED ORDER — OXYBUTYNIN CHLORIDE ER 10 MG PO TB24: 10.0000 mg | ORAL_TABLET | Freq: Every day | ORAL | 0 refills | Status: AC | PRN

## 2019-03-05 MED ORDER — BELLADONNA ALKALOIDS-OPIUM 16.2-60 MG RE SUPP
RECTAL | Status: DC | PRN
  Administered 2019-03-05: 1 via RECTAL

## 2019-03-05 MED ORDER — KETOROLAC TROMETHAMINE 10 MG PO TABS: 10.0000 mg | ORAL_TABLET | Freq: Three times a day (TID) | ORAL | 0 refills | Status: DC | PRN

## 2019-03-05 MED ORDER — SODIUM CHLORIDE 0.9 % IV SOLN: INTRAVENOUS | Status: DC

## 2019-03-05 MED ORDER — LIDOCAINE HCL (CARDIAC) PF 100 MG/5ML IV SOSY
PREFILLED_SYRINGE | INTRAVENOUS | Status: DC | PRN
  Administered 2019-03-05: 100 mg via INTRATRACHEAL

## 2019-03-05 SURGICAL SUPPLY — 29 items
BAG DRAIN CYSTO-URO LG1000N (MISCELLANEOUS) ×3 IMPLANT
BRUSH SCRUB EZ 1% IODOPHOR (MISCELLANEOUS) ×3 IMPLANT
CATH URETL 5X70 OPEN END (CATHETERS) IMPLANT
CNTNR SPEC 2.5X3XGRAD LEK (MISCELLANEOUS)
CONT SPEC 4OZ STER OR WHT (MISCELLANEOUS)
CONTAINER SPEC 2.5X3XGRAD LEK (MISCELLANEOUS) IMPLANT
DRAPE UTILITY 15X26 TOWEL STRL (DRAPES) ×3 IMPLANT
FIBER LASER TRAC TIP (UROLOGICAL SUPPLIES) ×3 IMPLANT
GLOVE BIOGEL PI IND STRL 7.5 (GLOVE) ×1 IMPLANT
GLOVE BIOGEL PI INDICATOR 7.5 (GLOVE) ×2
GOWN STRL REUS W/ TWL LRG LVL3 (GOWN DISPOSABLE) ×1 IMPLANT
GOWN STRL REUS W/ TWL XL LVL3 (GOWN DISPOSABLE) ×1 IMPLANT
GOWN STRL REUS W/TWL LRG LVL3 (GOWN DISPOSABLE) ×2
GOWN STRL REUS W/TWL XL LVL3 (GOWN DISPOSABLE) ×2
GUIDEWIRE STR DUAL SENSOR (WIRE) ×6 IMPLANT
INFUSOR MANOMETER BAG 3000ML (MISCELLANEOUS) ×3 IMPLANT
INTRODUCER DILATOR DOUBLE (INTRODUCER) IMPLANT
KIT TURNOVER CYSTO (KITS) ×3 IMPLANT
PACK CYSTO AR (MISCELLANEOUS) ×3 IMPLANT
SET CYSTO W/LG BORE CLAMP LF (SET/KITS/TRAYS/PACK) ×3 IMPLANT
SHEATH URETERAL 12FRX35CM (MISCELLANEOUS) IMPLANT
SOL .9 NS 3000ML IRR  AL (IV SOLUTION) ×2
SOL .9 NS 3000ML IRR UROMATIC (IV SOLUTION) ×1 IMPLANT
STENT URET 6FRX24 CONTOUR (STENTS) IMPLANT
STENT URET 6FRX26 CONTOUR (STENTS) ×3 IMPLANT
SURGILUBE 2OZ TUBE FLIPTOP (MISCELLANEOUS) ×3 IMPLANT
SYR 10ML LL (SYRINGE) ×3 IMPLANT
VALVE UROSEAL ADJ ENDO (VALVE) ×3 IMPLANT
WATER STERILE IRR 1000ML POUR (IV SOLUTION) ×3 IMPLANT

## 2019-03-05 NOTE — Progress Notes (Signed)
Patient to recliner with assistance.  States, "feels sleepy." Sa02 ranges from 72-89 on room air Denies shortness of breath .O2 placed at [facility]. SA02 continues to drop to 76% when not stimulating patient to keep her awake.  Dr. Lubertha Basque notified of above.  CPAP ordered. Patient does not have hme CPAP here today

## 2019-03-05 NOTE — H&P (Signed)
UROLOGY H&P UPDATE  Agree with prior H&P dated 02/09/2019.  Very comorbid 64 year old female with a 1 cm proximal ureteral stone.  Prior ureteroscopy on 02/18/2019 unable to safely access stone and stent placed.  Here today for definitive management with right ureteroscopy, laser lithotripsy, stent exchange.  Cardiac: RRR Lungs: CTA bilaterally  Laterality: Right Procedure: Right ureteroscopy, laser lithotripsy, stent exchange  Urine: Culture 02/23/2019 10-25K mixed urogenital flora  We specifically discussed the risks ureteroscopy including bleeding, infection/sepsis, stent related symptoms including flank pain/urgency/frequency/incontinence/dysuria, ureteral injury, inability to access stone, or need for staged or additional procedures.   Billey Co, MD 03/05/2019

## 2019-03-05 NOTE — Op Note (Signed)
Date of procedure: 03/05/19  Preoperative diagnosis:  1. Right 1 cm proximal ureteral stone  Postoperative diagnosis:  1. Same  Procedure: 1. Cystoscopy, right ureteroscopy and laser lithotripsy, right retrograde pyelogram with intraoperative interpretation, right ureteral stent placement  Surgeon: Nickolas Madrid, MD  Anesthesia: General  Complications: None  Intraoperative findings:  1.  Very hard 1 cm right renal stone dusted 2.  Uncomplicated stent placement on Dangler  EBL: Minimal  Specimens: None  Drains: Right 6 French by 26 cm ureteral stent on Dangler  Indication: Theresa Leon is a 64 y.o. patient with a 1 cm right proximal ureteral stone that previously underwent stent placement for passive dilation in the setting of inability to access the stone.  After reviewing the management options for treatment, they elected to proceed with the above surgical procedure(s). We have discussed the potential benefits and risks of the procedure, side effects of the proposed treatment, the likelihood of the patient achieving the goals of the procedure, and any potential problems that might occur during the procedure or recuperation. Informed consent has been obtained.  Description of procedure:  The patient was taken to the operating room and general anesthesia was induced. SCDs were placed for DVT prophylaxis. The patient was placed in the dorsal lithotomy position, prepped and draped in the usual sterile fashion, and preoperative antibiotics were administered. A preoperative time-out was performed.   A 21 French rigid cystoscope was used to intubate the urethra.  A sensor wire was advanced alongside the indwelling right ureteral stent up into the kidney under fluoroscopic vision.  The stent was then grasped and pulled to the meatus, and a second sensor wire was added to the collecting system under fluoroscopic vision.  A single-channel digital flexible ureteroscope advanced easily over  the wire up into the kidney.  Thorough inspection revealed a 1 cm stone in the upper pole that appeared calcium oxalate in nature.  I attempted to fragment the stone with a 200 m laser fiber on settings of 0.5 J and 20 Hz, however the stone was very hard.  I changed settings to 1.0 J and 10 Hz, the stone was carefully dusted into <1 mm fragments.  Thorough inspection of the kidney revealed no residual fragments at the conclusion of the procedure.  Retrograde pyelogram from the proximal ureter showed no extravasation or filling defects.  Careful pullback ureteroscopy demonstrated no residual fragments or ureteral injury.  The rigid cystoscope was backloaded over the wire and a 6 [place] by 26 cm stent was uneventfully placed with a curl over the kidney under fluoroscopic vision, as well as under direct vision the bladder.  The Dangler was left in place, and the string was secured to the suprapubic area with a Tegaderm.  A belladonna suppository was placed.  Disposition: Stable to PACU  Plan: Remove stent at home on Tuesday 03/09/2019 Bactrim prophylaxis while stent in place   Nickolas Madrid, MD

## 2019-03-05 NOTE — Transfer of Care (Signed)
Immediate Anesthesia Transfer of Care Note  Patient: Theresa Leon  Procedure(s) Performed: CYSTOSCOPY/URETEROSCOPY/HOLMIUM LASER/STENT EXCHANGE (Right Ureter) CYSTOSCOPY WITH RETROGRADE PYELOGRAM (Right Ureter)  Patient Location: PACU  Anesthesia Type:General  Level of Consciousness: drowsy  Airway & Oxygen Therapy: Patient Spontanous Breathing and Patient connected to face mask oxygen  Post-op Assessment: Report given to RN and Post -op Vital signs reviewed and stable  Post vital signs: Reviewed and stable  Last Vitals:  Vitals Value Taken Time  BP 98/41 03/05/19 1149  Temp    Pulse 67 03/05/19 1149  Resp 13 03/05/19 1149  SpO2 97 % 03/05/19 1149  Vitals shown include unvalidated device data.  Last Pain:  Vitals:   03/05/19 0850  TempSrc: Temporal  PainSc: 5       Patients Stated Pain Goal: 0 (03/27/38 6986)  Complications: No apparent anesthesia complications

## 2019-03-05 NOTE — Discharge Instructions (Signed)

## 2019-03-05 NOTE — Anesthesia Preprocedure Evaluation (Addendum)
Anesthesia Evaluation  Patient identified by MRN, date of birth, ID band Patient awake    Reviewed: Allergy & Precautions, H&P , NPO status , Patient's Chart, lab work & pertinent test results  Airway Mallampati: III  TM Distance: >3 FB Neck ROM: full   Comment: Very large neck Dental  (+) Missing, Poor Dentition   Pulmonary shortness of breath and with exertion, asthma , sleep apnea , COPD, former smoker,  H/o tracheostomy 5 years ago during prolonged hospitalization, s/p decannulation          Cardiovascular +CHF  + pacemaker + Valvular Problems/Murmurs (s/p AVR)   Grade 2 diastolic dysfunction Elevated PA pressures, normal RV   Neuro/Psych negative neurological ROS  negative psych ROS   GI/Hepatic GERD  ,(+) Cirrhosis       , NASH   Endo/Other  diabetesHypothyroidism Morbid obesity (super morbid obesity)  Renal/GU Renal disease     Musculoskeletal   Abdominal   Peds  Hematology Thrombocytopenia (platelets 89K)   Anesthesia Other Findings Past Medical History: No date: Acute on chronic renal failure (HCC) No date: Anemia 1975: Asthma No date: COPD (chronic obstructive pulmonary disease) (HCC) No date: Diabetes ([place]) 2012: Diabetes mellitus without complication (HCC) No date: Diastolic CHF, acute on chronic (HCC) No date: H/O aortic valve replacement No date: Heart disease No date: Heart murmur No date: Hypothyroid No date: Kidney stone No date: Liver cirrhosis secondary to NASH (nonalcoholic  steatohepatitis) ([place]) No date: Morbid obesity ([place]) 2013: 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, left breast  No date: OSA (obstructive sleep apnea)     Comment:  CPAP No date: Presence of permanent cardiac pacemaker  Past Surgical History: 2008: aortic valve relaced  04/24/2011: BREAST BIOPSY; Left     Comment:  left breast bx neg done by dr byrnett September 25, 2011: BREAST  SURGERY     Comment:  intraductal papilloma of the left breast 2007: CHOLECYSTECTOMY 08/04/2014: COLONOSCOPY WITH PROPOFOL; N/A     Comment:  Procedure: COLONOSCOPY WITH PROPOFOL;  Surgeon: Manya Silvas, MD;  Location: [facility] ENDOSCOPY;  Service:               Endoscopy;  Laterality: N/A; 02/18/2019: CYSTOSCOPY W/ RETROGRADES; Right     Comment:  Procedure: CYSTOSCOPY WITH RETROGRADE PYELOGRAM;                Surgeon: Billey Co, MD;  Location: [facility] ORS;                Service: Urology;  Laterality: Right; 02/18/2019: CYSTOSCOPY/URETEROSCOPY/HOLMIUM LASER/STENT PLACEMENT;  Right     Comment:  Procedure: CYSTOSCOPY/URETEROSCOPY/HOLMIUM LASER/STENT               PLACEMENT;  Surgeon: Billey Co, MD;  Location:               [facility] ORS;  Service: Urology;  Laterality: Right; 03/03/2015: ESOPHAGOGASTRODUODENOSCOPY; Left     Comment:  Procedure: ESOPHAGOGASTRODUODENOSCOPY (EGD);  Surgeon:               Manya Silvas, MD;  Location: [facility] ENDOSCOPY;                Service: Endoscopy;  Laterality: Left; 08/04/2014: ESOPHAGOGASTRODUODENOSCOPY (EGD) WITH PROPOFOL     Comment:  Procedure: ESOPHAGOGASTRODUODENOSCOPY (EGD) WITH  PROPOFOL;  Surgeon: Manya Silvas, MD;  Location: [facility]              ENDOSCOPY;  Service: Endoscopy;; 04/09/2016: ESOPHAGOGASTRODUODENOSCOPY (EGD) WITH PROPOFOL; N/A     Comment:  Procedure: ESOPHAGOGASTRODUODENOSCOPY (EGD) WITH               PROPOFOL;  Surgeon: Lucilla Lame, MD;  Location: [facility]               ENDOSCOPY;  Service: Endoscopy;  Laterality: N/A; 02/11/2019: EXTRACORPOREAL SHOCK WAVE LITHOTRIPSY; Right     Comment:  Procedure: EXTRACORPOREAL SHOCK WAVE LITHOTRIPSY (ESWL);              Surgeon: Hollice Espy, MD;  Location: [facility] ORS;                Service: Urology;  Laterality: Right; 02/18/2019: EXTRACORPOREAL SHOCK WAVE LITHOTRIPSY; Right     Comment:  Procedure: EXTRACORPOREAL SHOCK WAVE LITHOTRIPSY (ESWL);               Surgeon: Billey Co, MD;  Location: [facility] ORS;                Service: Urology;  Laterality: Right; No date: PACEMAKER INSERTION 2009: PHRENIC NERVE PACEMAKER IMPLANTATION 02/04/2014: TRACHEOSTOMY TUBE PLACEMENT; N/A     Comment:  Procedure: TRACHEOSTOMY;  Surgeon: Rozetta Nunnery,              MD;  Location: [facility] OR;  Service: ENT;  Laterality: N/A;     Reproductive/Obstetrics negative OB ROS                           Anesthesia Physical Anesthesia Plan  ASA: IV  Anesthesia Plan: General ETT   Post-op Pain Management:    Induction:   PONV Risk Score and Plan: Ondansetron, Dexamethasone and Treatment may vary due to age or medical condition  Airway Management Planned:   Additional Equipment:   Intra-op Plan:   Post-operative Plan:   Informed Consent: I have reviewed the patients History and Physical, chart, labs and discussed the procedure including the risks, benefits and alternatives for the proposed anesthesia with the patient or authorized representative who has indicated his/her understanding and acceptance.     Dental Advisory Given  Plan Discussed with: Anesthesiologist  Anesthesia Plan Comments:        Anesthesia Quick Evaluation

## 2019-03-07 NOTE — Anesthesia Postprocedure Evaluation (Signed)
Anesthesia Post Note  Patient: Theresa Leon  Procedure(s) Performed: CYSTOSCOPY/URETEROSCOPY/HOLMIUM LASER/STENT EXCHANGE (Right Ureter) CYSTOSCOPY WITH RETROGRADE PYELOGRAM (Right Ureter)  Patient location during evaluation: PACU Anesthesia Type: General Level of consciousness: awake and alert Pain management: pain level controlled Vital Signs Assessment: post-procedure vital signs reviewed and stable Respiratory status: spontaneous breathing, nonlabored ventilation and respiratory function stable (required CPAP in PACU) Cardiovascular status: blood pressure returned to baseline and stable Postop Assessment: no apparent nausea or vomiting Anesthetic complications: no     Last Vitals:  Vitals:   03/05/19 1500 03/05/19 1510  BP:  108/67  Pulse: 66 (!) 58  Resp: 13 12  Temp:    SpO2: 96% 93%    Last Pain:  Vitals:   03/05/19 1510  TempSrc:   PainSc: 0-No pain                 Tera Mater

## 2019-03-19 ENCOUNTER — Other Ambulatory Visit: Payer: Self-pay | Admitting: Anesthesiology

## 2019-03-19 DIAGNOSIS — M79606 Pain in leg, unspecified: Secondary | ICD-10-CM

## 2019-03-19 DIAGNOSIS — M545 Low back pain, unspecified: Secondary | ICD-10-CM

## 2019-03-26 ENCOUNTER — Telehealth (HOSPITAL_COMMUNITY): Payer: Self-pay

## 2019-03-31 ENCOUNTER — Other Ambulatory Visit: Payer: Self-pay | Admitting: Anesthesiology

## 2019-03-31 DIAGNOSIS — M79606 Pain in leg, unspecified: Secondary | ICD-10-CM

## 2019-03-31 DIAGNOSIS — M5416 Radiculopathy, lumbar region: Secondary | ICD-10-CM

## 2019-03-31 DIAGNOSIS — M545 Low back pain, unspecified: Secondary | ICD-10-CM

## 2019-04-01 ENCOUNTER — Other Ambulatory Visit: Payer: Self-pay | Admitting: Internal Medicine

## 2019-04-01 DIAGNOSIS — Z1231 Encounter for screening mammogram for malignant neoplasm of breast: Secondary | ICD-10-CM

## 2019-04-21 ENCOUNTER — Ambulatory Visit: Payer: Medicare Other

## 2019-04-22 ENCOUNTER — Ambulatory Visit
Admission: RE | Admit: 2019-04-22 | Discharge: 2019-04-22 | Disposition: A | Discharge status: Home / Self Care | Payer: Medicare Other | Source: Ambulatory Visit | Attending: Internal Medicine | Admitting: Internal Medicine

## 2019-04-22 DIAGNOSIS — Z1231 Encounter for screening mammogram for malignant neoplasm of breast: Secondary | ICD-10-CM | POA: Insufficient documentation

## 2019-05-03 ENCOUNTER — Other Ambulatory Visit: Payer: Self-pay

## 2019-05-03 ENCOUNTER — Ambulatory Visit
Admission: RE | Admit: 2019-05-03 | Discharge: 2019-05-03 | Disposition: A | Discharge status: Home / Self Care | Payer: Medicare Other | Source: Ambulatory Visit | Attending: Anesthesiology | Admitting: Anesthesiology

## 2019-05-03 DIAGNOSIS — M79606 Pain in leg, unspecified: Secondary | ICD-10-CM | POA: Diagnosis not present

## 2019-05-03 DIAGNOSIS — M5416 Radiculopathy, lumbar region: Secondary | ICD-10-CM | POA: Diagnosis present

## 2019-05-03 DIAGNOSIS — M545 Low back pain, unspecified: Secondary | ICD-10-CM

## 2019-06-23 ENCOUNTER — Ambulatory Visit
Admission: RE | Admit: 2019-06-23 | Discharge: 2019-06-23 | Disposition: A | Discharge status: Home / Self Care | Payer: Medicare Other | Source: Ambulatory Visit | Attending: Urology | Admitting: Urology

## 2019-06-23 ENCOUNTER — Other Ambulatory Visit: Payer: Self-pay

## 2019-06-23 ENCOUNTER — Encounter: Payer: Self-pay | Admitting: Urology

## 2019-06-23 ENCOUNTER — Ambulatory Visit: Payer: Medicare Other | Admitting: Urology

## 2019-06-23 VITALS — BP 95/62 | HR 89

## 2019-06-23 DIAGNOSIS — N200 Calculus of kidney: Secondary | ICD-10-CM | POA: Diagnosis not present

## 2019-06-23 NOTE — Progress Notes (Signed)
   06/23/2019 12:00 PM   SHIRON WHETSEL April 25, 1955 [id number]  Reason for visit: Follow up nephrolithiasis  HPI: I saw Ms. Brekke back in urology clinic today for follow-up of nephrolithiasis.  She is a 64 year old very comorbid female with morbid obesity who was originally scheduled for shockwave lithotripsy for a 1 cm right proximal ureteral stone.  On the truck they were unable to safely ventilate her and she was converted over to ureteroscopy.  I originally had to place a stent as I was unable to access the stone, but she underwent definitive management of her right 1 cm UPJ stone on 03/05/2019.  Her stent was left on a Dangler and she removed this at home.  She reports some intermittent right-sided flank pain over the last few weeks that comes and goes.  She thinks it may be worse when she drinks lots of fluids.  She denies any urinary symptoms of hematuria, dysuria, urgency, or frequency.  She is not sure if it could be related to her kidney, or her baseline back pain.  She denies any UTIs since surgery.  I recommended a renal ultrasound and KUB to rule out any persistent hydronephrosis or stone fragments, will call with results  Billey Co, [place] [address] [place], [place] [address] [phone]

## 2019-06-24 ENCOUNTER — Telehealth: Payer: Self-pay

## 2019-06-24 NOTE — Telephone Encounter (Signed)
-----   Message from Billey Co, MD sent at 06/24/2019  7:38 AM EDT ----- No stones seen on xray. Keep appt for renal ultrasound to look for any swelling, and will call with those results.  Thanks Nickolas Madrid, MD 06/24/2019

## 2019-06-24 NOTE — Telephone Encounter (Signed)
Called pt no answer. LM for pt to call back, per DPR. 1st attempt.

## 2019-06-29 ENCOUNTER — Emergency Department
Admission: EM | Admit: 2019-06-29 | Discharge: 2019-06-29 | Disposition: A | Discharge status: Home / Self Care | Payer: Medicare Other | Attending: Emergency Medicine | Admitting: Emergency Medicine

## 2019-06-29 ENCOUNTER — Emergency Department: Payer: Medicare Other

## 2019-06-29 ENCOUNTER — Other Ambulatory Visit: Payer: Self-pay

## 2019-06-29 DIAGNOSIS — N200 Calculus of kidney: Secondary | ICD-10-CM | POA: Diagnosis not present

## 2019-06-29 DIAGNOSIS — E119 Type 2 diabetes mellitus without complications: Secondary | ICD-10-CM | POA: Diagnosis not present

## 2019-06-29 DIAGNOSIS — R1031 Right lower quadrant pain: Secondary | ICD-10-CM | POA: Diagnosis present

## 2019-06-29 DIAGNOSIS — Z7984 Long term (current) use of oral hypoglycemic drugs: Secondary | ICD-10-CM | POA: Insufficient documentation

## 2019-06-29 LAB — URINALYSIS, COMPLETE (UACMP) WITH MICROSCOPIC
Bacteria, UA: NONE SEEN
Bilirubin Urine: NEGATIVE
Glucose, UA: NEGATIVE mg/dL
Ketones, ur: NEGATIVE mg/dL
Leukocytes,Ua: NEGATIVE
Nitrite: NEGATIVE
Protein, ur: NEGATIVE mg/dL
Specific Gravity, Urine: 1.018 (ref 1.005–1.030)
pH: 6 (ref 5.0–8.0)

## 2019-06-29 LAB — CBC
HCT: 38.7 % (ref 36.0–46.0)
Hemoglobin: 13.5 g/dL (ref 12.0–15.0)
MCH: 32.1 pg (ref 26.0–34.0)
MCHC: 34.9 g/dL (ref 30.0–36.0)
MCV: 91.9 fL (ref 80.0–100.0)
Platelets: 95 10*3/uL — ABNORMAL LOW (ref 150–400)
RBC: 4.21 MIL/uL (ref 3.87–5.11)
RDW: 13.5 % (ref 11.5–15.5)
WBC: 5.5 10*3/uL (ref 4.0–10.5)
nRBC: 0 % (ref 0.0–0.2)

## 2019-06-29 LAB — BASIC METABOLIC PANEL
Anion gap: 7 (ref 5–15)
BUN: 14 mg/dL (ref 8–23)
CO2: 29 mmol/L (ref 22–32)
Calcium: 8.9 mg/dL (ref 8.9–10.3)
Chloride: 103 mmol/L (ref 98–111)
Creatinine, Ser: 0.93 mg/dL (ref 0.44–1.00)
GFR calc Af Amer: >60 mL/min (ref >60)
GFR calc non Af Amer: >60 mL/min (ref >60)
Glucose, Bld: 92 mg/dL (ref 70–99)
Potassium: 4.6 mmol/L (ref 3.5–5.1)
Sodium: 139 mmol/L (ref 135–145)

## 2019-06-29 MED ORDER — TAMSULOSIN HCL 0.4 MG PO CAPS: 0.4000 mg | ORAL_CAPSULE | Freq: Every day | ORAL | 0 refills | Status: AC

## 2019-06-29 MED ORDER — OXYCODONE-ACETAMINOPHEN 5-325 MG PO TABS: 1.0000 | ORAL_TABLET | ORAL | 0 refills | Status: DC | PRN

## 2019-06-29 MED ORDER — TAMSULOSIN HCL 0.4 MG PO CAPS
0.4000 mg | ORAL_CAPSULE | Freq: Every day | ORAL | 0 refills | Status: DC
Start: 2019-06-29 — End: 2019-06-29

## 2019-06-29 NOTE — ED Notes (Signed)
E-signature not working at this time. Pt verbalized understanding of D/C instructions, prescriptions and follow up care with no further questions at this time. Pt in NAD and wheeled to lobby at time of D/C.

## 2019-06-29 NOTE — ED Triage Notes (Signed)
Pt arrives to ER for R flank pain. States urology did an xray but "they didn't say anything" denies hematuria. Denies urinary frequency or dysuria. Denies fever, N&V.   A&O, in wheelchair.

## 2019-06-29 NOTE — ED Provider Notes (Signed)
[facility] Emergency Department Provider Note  Time seen: 6:45 PM  I have reviewed the triage vital signs and the nursing notes.   HISTORY  Chief Complaint Flank Pain   HPI Theresa Leon is a 64 y.o. female with a past medical history of anemia, asthma, COPD, diabetes, kidney stones, presents emergency department for right flank pain.  According to the patient in February she had 2 lithotripsies for kidney stones.  States ever since that time she has had a mild dull pain in her right flank.  Proximally 1 month ago she began experiencing more moderate sharp pains to this area that come and go.  She states several days [place] the beach when the pain became severe.  States it had alleviated somewhat and then returned again today so she came to the emergency department for evaluation.  Patient states approximately 1 week ago she was seen by her urologist Dr. Caprice Beaver for the same who ordered an x-ray that was negative.  Patient denies any hematuria or dysuria but states this feels identical to her past kidney stones.   Past Medical History:  Diagnosis Date  . Acute on chronic renal failure ([place])   . Anemia   . Asthma 1975  . COPD (chronic obstructive pulmonary disease) ([place])   . Diabetes ([place])   . Diabetes mellitus without complication ([place]) 7017  . Diastolic CHF, acute on chronic (HCC)   . H/O aortic valve replacement   . Heart disease   . Heart murmur   . Hypothyroid   . Kidney stone   . Liver cirrhosis secondary to NASH (nonalcoholic steatohepatitis) ([place])   . Morbid obesity ([place])   . 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 OSA (obstructive sleep apnea)    CPAP  . Presence of permanent cardiac pacemaker     Patient Active Problem List   Diagnosis Date Noted  . Chest pain 03/05/2017  . V tach ([place]) 09/18/2015  . Iron deficiency anemia due to chronic blood loss 05/03/2015  . Gastritis due to nonsteroidal  anti-inflammatory drug (NSAID) 03/05/2015  . History of esophagogastroduodenoscopy (EGD) 03/05/2015  . Type 2 diabetes mellitus ([place]) 03/01/2015  . GERD (gastroesophageal reflux disease) 03/01/2015  . HLD (hyperlipidemia) 03/01/2015  . COPD (chronic obstructive pulmonary disease) ([place]) 03/01/2015  . Chronic diastolic CHF (congestive heart failure) ([place]) 03/01/2015  . Hypothyroidism 03/01/2015  . OSA on CPAP 03/01/2015  . Acute posthemorrhagic anemia 03/01/2015  . Dysphagia   . CHF (congestive heart failure) ([place])   . Acute respiratory failure with hypoxia ([place]) 01/26/2014  . Encounter for imaging study to confirm orogastric (OG) tube placement   . Respiratory failure ([place])   . 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 aortic valve relaced   2008  . BREAST BIOPSY Left 04/24/2011   left breast bx -neg done by dr byrnett  . BREAST SURGERY  September 25, 2011   intraductal papilloma of the left breast  . CHOLECYSTECTOMY  2007  . COLONOSCOPY WITH PROPOFOL N/A 08/04/2014   Procedure: COLONOSCOPY WITH PROPOFOL;  Surgeon: Manya Silvas, MD;  Location: [facility] ENDOSCOPY;  Service: Endoscopy;  Laterality: N/A;  . CYSTOSCOPY W/ RETROGRADES Right 02/18/2019   Procedure: CYSTOSCOPY WITH RETROGRADE PYELOGRAM;  Surgeon: Billey Co, MD;  Location: [facility] ORS;  Service: Urology;  Laterality: Right;  . CYSTOSCOPY W/ RETROGRADES Right 03/05/2019  Procedure: CYSTOSCOPY WITH RETROGRADE PYELOGRAM;  Surgeon: Billey Co, MD;  Location: [facility] ORS;  Service: Urology;  Laterality: Right;  . CYSTOSCOPY/URETEROSCOPY/HOLMIUM LASER/STENT PLACEMENT Right 02/18/2019   Procedure: CYSTOSCOPY/URETEROSCOPY/HOLMIUM LASER/STENT PLACEMENT;  Surgeon: Billey Co, MD;  Location: [facility] ORS;  Service: Urology;  Laterality: Right;  . CYSTOSCOPY/URETEROSCOPY/HOLMIUM LASER/STENT PLACEMENT Right 03/05/2019   Procedure: CYSTOSCOPY/URETEROSCOPY/HOLMIUM LASER/STENT  EXCHANGE;  Surgeon: Billey Co, MD;  Location: [facility] ORS;  Service: Urology;  Laterality: Right;  . ESOPHAGOGASTRODUODENOSCOPY Left 03/03/2015   Procedure: ESOPHAGOGASTRODUODENOSCOPY (EGD);  Surgeon: Manya Silvas, MD;  Location[facility] ENDOSCOPY;  Service: Endoscopy;  Laterality: Left;  . ESOPHAGOGASTRODUODENOSCOPY (EGD) WITH PROPOFOL  08/04/2014   Procedure: ESOPHAGOGASTRODUODENOSCOPY (EGD) WITH PROPOFOL;  Surgeon: Manya Silvas, MD;  Location: [facility] ENDOSCOPY;  Service: Endoscopy;;  . ESOPHAGOGASTRODUODENOSCOPY (EGD) WITH PROPOFOL N/A 04/09/2016   Procedure: ESOPHAGOGASTRODUODENOSCOPY (EGD) WITH PROPOFOL;  Surgeon: Lucilla Lame, MD;  Location: [facility] ENDOSCOPY;  Service: Endoscopy;  Laterality: N/A;  . EXTRACORPOREAL SHOCK WAVE LITHOTRIPSY Right 02/11/2019   Procedure: EXTRACORPOREAL SHOCK WAVE LITHOTRIPSY (ESWL);  Surgeon: Hollice Espy, MD;  Location: [facility] ORS;  Service: Urology;  Laterality: Right;  . EXTRACORPOREAL SHOCK WAVE LITHOTRIPSY Right 02/18/2019   Procedure: EXTRACORPOREAL SHOCK WAVE LITHOTRIPSY (ESWL);  Surgeon: Billey Co, MD;  Location: [facility] ORS;  Service: Urology;  Laterality: Right;  . PACEMAKER INSERTION    . PHRENIC NERVE PACEMAKER IMPLANTATION  2009  . TRACHEOSTOMY TUBE PLACEMENT N/A 02/04/2014   Procedure: TRACHEOSTOMY;  Surgeon: Rozetta Nunnery, MD;  Location: [place];  Service: ENT;  Laterality: N/A;    Prior to Admission medications   Medication Sig Start Date End Date Taking? Authorizing Provider  acetaminophen-codeine (TYLENOL #3) 300-30 MG tablet Take 1 tablet by mouth 5 (five) times daily as needed. 05/28/19   [provider]  albuterol (PROVENTIL HFA;VENTOLIN HFA) 108 (90 BASE) MCG/ACT inhaler Inhale 2 puffs into the lungs every 6 (six) hours as needed for wheezing or shortness of breath.     [provider]  Calcium Carbonate-Vitamin D (CALCIUM 600+D) 600-400 MG-UNIT tablet Take 1 tablet by mouth at bedtime.    [provider]   Cholecalciferol (VITAMIN D) 125 MCG (5000 UT) CAPS Take 5,000 mg by mouth daily.    [provider]  citalopram (CELEXA) 40 MG tablet Take 40 mg by mouth daily.    [provider]  DULoxetine (CYMBALTA) 30 MG capsule Take 30 mg by mouth daily. 02/02/19   [provider]  EPINEPHrine (EPIPEN 2-PAK) 0.3 mg/0.3 mL IJ SOAJ injection Inject 0.3 mg into the muscle once as needed (for severe allergic reaction).    [provider]  fluticasone (FLONASE) 50 MCG/ACT nasal spray Place 2 sprays into both nostrils daily as needed for rhinitis.     [provider]  gabapentin (NEURONTIN) 600 MG tablet Take 600 mg by mouth 4 (four) times daily.     [provider]  glimepiride (AMARYL) 2 MG tablet Take 2 mg by mouth 2 (two) times daily. 05/04/19   [provider]  Lancets (ONETOUCH DELICA PLUS [id number]) [place]  06/17/19   [provider]  levothyroxine (SYNTHROID, LEVOTHROID) 125 MCG tablet Take 125 mcg by mouth daily before breakfast.     [provider]  losartan (COZAAR) 25 MG tablet Take 25 mg by mouth daily. 06/21/19   [provider]  metFORMIN (GLUCOPHAGE) 500 MG tablet Take 750 mg by mouth 2 (two) times daily. 02/16/19   [provider]  metoprolol succinate (  TOPROL-XL) 25 MG 24 hr tablet  04/20/19   [provider]  montelukast (SINGULAIR) 10 MG tablet Take 10 mg by mouth at bedtime.    [provider]  naproxen sodium (ALEVE) 220 MG tablet Take 440 mg by mouth daily as needed (pain).    [provider]  sucralfate (CARAFATE) 1 g tablet Take 1 g by mouth 3 (three) times daily.    [provider]  torsemide (DEMADEX) 20 MG tablet Take 20 mg by mouth every other day.  06/03/17   [provider]  traZODone (DESYREL) 100 MG tablet Take 100 mg by mouth at bedtime.    [provider]  XTAMPZA ER 9 MG C12A Take 9 mg by mouth 2 (two) times daily.  06/11/17   [provider]    Allergies  Allergen Reactions  . Mold Extract [Trichophyton] Anaphylaxis  . Tetanus Toxoids Other (See Comments)    Pt states that her arm turns black.    . Furosemide Other (See Comments)    Pt states that this medication caused renal failure.    Marland Kitchen Hctz [Hydrochlorothiazide] Other (See Comments)    Reports renal failure   . Other Hives, Swelling and Other (See Comments)    Pt states that she is allergic to steroids.    Pratte Crigler [Beclomethasone] Other (See Comments)    Thrush   . Toprol Xl [Metoprolol Tartrate] Hives  . Nickel Hives  . Nitroglycerin Other (See Comments)    Pt states that this medication makes her BP bottom out.      Family History  Problem Relation Age of Onset  . Skin telangiectasia Mother   . Paget's disease of bone Mother   . Cancer Mother        skin  . Breast cancer Maternal Grandmother   . Cancer Maternal Grandmother        ovarian, pancreatic  . Alzheimer's disease Father   . Atrial fibrillation Father   . Lung cancer Maternal Grandfather     Social History Social History   Tobacco Use  . Smoking status: Former Smoker    Quit date: 07/28/2008    Years since quitting: 10.9  . Smokeless tobacco: Never Used  Substance Use Topics  . Alcohol use: No  . Drug use: No    Review of Systems Constitutional: Negative for fever. Cardiovascular: Negative for chest pain. Respiratory: Negative for shortness of breath. Gastrointestinal: Right flank pain. Genitourinary: Negative for urinary compaints Musculoskeletal: Negative for musculoskeletal complaints Neurological: Negative for headache All other ROS negative  ____________________________________________   PHYSICAL EXAM:  VITAL SIGNS: ED Triage Vitals  Enc Vitals Group     BP 06/29/19 1715 122/71     Pulse Rate 06/29/19 1715 65     Resp 06/29/19 1715 18     Temp 06/29/19 1715 98.6 F (37 C)     Temp Source 06/29/19 1715 Oral     SpO2 06/29/19 1715 94 %     Weight  06/29/19 1716 (!) 319 lb (144.7 kg)     Height 06/29/19 1716 5' 7"  (1.702 m)     Head Circumference --      Peak Flow --      Pain Score 06/29/19 1715 9     Pain Loc --      Pain Edu? --      Excl. in [place]? --     Constitutional: Alert and oriented. Well appearing and in no distress. Eyes: Normal exam ENT  Head: Normocephalic and atraumatic.      Mouth/Throat: Mucous membranes are moist. Cardiovascular: Normal rate, regular rhythm.  Respiratory: Normal respiratory effort without tachypnea nor retractions. Breath sounds are clear  Gastrointestinal: Soft and nontender. No distention.  Musculoskeletal: Nontender with normal range of motion in all extremities.  Neurologic:  Normal speech and language. No gross focal neurologic deficits  Skin:  Skin is warm, dry and intact.  Psychiatric: Mood and affect are normal.   ____________________________________________     RADIOLOGY  CT shows a 3 mm right UPJ stone.  ____________________________________________   INITIAL IMPRESSION / ASSESSMENT AND PLAN / ED COURSE  Pertinent labs & imaging results that were available during my care of the patient were reviewed by me and considered in my medical decision making (see chart for details).   Patient presents emergency department for right flank pain.  Differential would include musculoskeletal pain, ureterolithiasis, pyelonephritis or UTI.  Patient is status post cholecystectomy but still has her appendix.  Patient's lab work is reassuring.  There is a small amount of hemoglobin in her UA.  We will obtain a CT renal scan to further evaluate.  Patient agreeable to plan of care.  States minimal discomfort currently.  CT scan confirms 3 mm right UPJ stone.  We will have the patient follow-up with her urologist.  We will place the patient on Flomax as well as pain medication if needed.  Patient agreeable to plan of care.  Theresa Leon was evaluated in Emergency Department on 06/29/2019 for the  symptoms described in the history of present illness. She was evaluated in the context of the global COVID-19 pandemic, which necessitated consideration that the patient might be at risk for infection with the SARS-CoV-2 virus that causes COVID-19. Institutional protocols and algorithms that pertain to the evaluation of patients at risk for COVID-19 are in a state of rapid change based on information released by regulatory bodies including the CDC and federal and state organizations. These policies and algorithms were followed during the patient's care in the ED.  ____________________________________________   FINAL CLINICAL IMPRESSION(S) / ED DIAGNOSES  Right flank pain Kidney stone   Harvest Dark, MD 06/29/19 2020

## 2019-06-29 NOTE — ED Triage Notes (Signed)
First nurse note- came EMS for possible kidney stones.  Pain is intermittent. VSS with EMS

## 2019-06-29 NOTE — Telephone Encounter (Signed)
Called pt's mobile number. No answer. Unable to leave detailed message per DPR. 2nd attempt.

## 2019-07-02 ENCOUNTER — Telehealth: Payer: Self-pay | Admitting: Urology

## 2019-07-02 NOTE — Telephone Encounter (Signed)
Please see telephone encounter from 06/24/2019 which leads to today, showing documentation where patient was called 07/02/2019 at 12:23pm. I will contact patient. Thanks

## 2019-07-02 NOTE — Telephone Encounter (Signed)
Called pt, 3rd attempt. No answer.

## 2019-07-02 NOTE — Telephone Encounter (Signed)
Pt left vm she states someone called her, I returned her call and informed her no notes where in chart as of whom called  She is also unaware of having an U/S on 07/07/19 please advice

## 2019-07-07 ENCOUNTER — Other Ambulatory Visit: Payer: Self-pay

## 2019-07-07 ENCOUNTER — Ambulatory Visit
Admission: RE | Admit: 2019-07-07 | Discharge: 2019-07-07 | Disposition: A | Discharge status: Home / Self Care | Payer: Medicare Other | Source: Ambulatory Visit | Attending: Urology | Admitting: Urology

## 2019-07-07 DIAGNOSIS — N200 Calculus of kidney: Secondary | ICD-10-CM | POA: Insufficient documentation

## 2019-07-08 ENCOUNTER — Telehealth: Payer: Self-pay

## 2019-07-08 NOTE — Telephone Encounter (Signed)
Called pt, mother answers LM w/ mother to have pt call office back .

## 2019-07-08 NOTE — Telephone Encounter (Signed)
-----   Message from Billey Co, MD sent at 07/08/2019 10:03 AM EDT ----- Renal US showed that her small 66m sright sided stone on CT from [id number] passed. No swelling in kidney or any stones seen.  Follow up as scheduled in September, sooner if any problems.   BNickolas Madrid MD 07/08/2019

## 2019-07-19 NOTE — Telephone Encounter (Signed)
Called pt no answer. 2nd attempt.

## 2019-07-27 NOTE — Telephone Encounter (Signed)
Letter mailed

## 2019-08-04 NOTE — Telephone Encounter (Signed)
error 

## 2019-08-22 ENCOUNTER — Emergency Department
Admission: EM | Admit: 2019-08-22 | Discharge: 2019-08-22 | Disposition: A | Discharge status: Home / Self Care | Payer: Medicare Other | Attending: Student in an Organized Health Care Education/Training Program | Admitting: Student in an Organized Health Care Education/Training Program

## 2019-08-22 ENCOUNTER — Emergency Department: Payer: Medicare Other

## 2019-08-22 ENCOUNTER — Other Ambulatory Visit: Payer: Self-pay

## 2019-08-22 DIAGNOSIS — Z87891 Personal history of nicotine dependence: Secondary | ICD-10-CM | POA: Insufficient documentation

## 2019-08-22 DIAGNOSIS — Z853 Personal history of malignant neoplasm of breast: Secondary | ICD-10-CM | POA: Insufficient documentation

## 2019-08-22 DIAGNOSIS — Z20822 Contact with and (suspected) exposure to covid-19: Secondary | ICD-10-CM | POA: Diagnosis not present

## 2019-08-22 DIAGNOSIS — J449 Chronic obstructive pulmonary disease, unspecified: Secondary | ICD-10-CM | POA: Diagnosis not present

## 2019-08-22 DIAGNOSIS — Z79890 Hormone replacement therapy (postmenopausal): Secondary | ICD-10-CM | POA: Diagnosis not present

## 2019-08-22 DIAGNOSIS — R059 Cough, unspecified: Secondary | ICD-10-CM

## 2019-08-22 DIAGNOSIS — Z952 Presence of prosthetic heart valve: Secondary | ICD-10-CM | POA: Insufficient documentation

## 2019-08-22 DIAGNOSIS — Z7951 Long term (current) use of inhaled steroids: Secondary | ICD-10-CM | POA: Insufficient documentation

## 2019-08-22 DIAGNOSIS — Z794 Long term (current) use of insulin: Secondary | ICD-10-CM | POA: Insufficient documentation

## 2019-08-22 DIAGNOSIS — N189 Chronic kidney disease, unspecified: Secondary | ICD-10-CM | POA: Diagnosis not present

## 2019-08-22 DIAGNOSIS — R05 Cough: Secondary | ICD-10-CM | POA: Diagnosis present

## 2019-08-22 DIAGNOSIS — E1122 Type 2 diabetes mellitus with diabetic chronic kidney disease: Secondary | ICD-10-CM | POA: Diagnosis not present

## 2019-08-22 DIAGNOSIS — R0789 Other chest pain: Secondary | ICD-10-CM | POA: Diagnosis not present

## 2019-08-22 DIAGNOSIS — E039 Hypothyroidism, unspecified: Secondary | ICD-10-CM | POA: Insufficient documentation

## 2019-08-22 DIAGNOSIS — Z950 Presence of cardiac pacemaker: Secondary | ICD-10-CM | POA: Insufficient documentation

## 2019-08-22 DIAGNOSIS — I5032 Chronic diastolic (congestive) heart failure: Secondary | ICD-10-CM | POA: Diagnosis not present

## 2019-08-22 DIAGNOSIS — Z79899 Other long term (current) drug therapy: Secondary | ICD-10-CM | POA: Diagnosis not present

## 2019-08-22 DIAGNOSIS — Z7952 Long term (current) use of systemic steroids: Secondary | ICD-10-CM | POA: Diagnosis not present

## 2019-08-22 LAB — BASIC METABOLIC PANEL
Anion gap: 10 (ref 5–15)
BUN: 16 mg/dL (ref 8–23)
CO2: 27 mmol/L (ref 22–32)
Calcium: 9.5 mg/dL (ref 8.9–10.3)
Chloride: 100 mmol/L (ref 98–111)
Creatinine, Ser: 1.01 mg/dL — ABNORMAL HIGH (ref 0.44–1.00)
GFR calc Af Amer: >60 mL/min (ref >60)
GFR calc non Af Amer: 60 mL/min — ABNORMAL LOW (ref >60)
Glucose, Bld: 114 mg/dL — ABNORMAL HIGH (ref 70–99)
Potassium: 4.7 mmol/L (ref 3.5–5.1)
Sodium: 137 mmol/L (ref 135–145)

## 2019-08-22 LAB — CBC
HCT: 42.9 % (ref 36.0–46.0)
Hemoglobin: 14.3 g/dL (ref 12.0–15.0)
MCH: 31 pg (ref 26.0–34.0)
MCHC: 33.3 g/dL (ref 30.0–36.0)
MCV: 93.1 fL (ref 80.0–100.0)
Platelets: 105 10*3/uL — ABNORMAL LOW (ref 150–400)
RBC: 4.61 MIL/uL (ref 3.87–5.11)
RDW: 13 % (ref 11.5–15.5)
WBC: 6.4 10*3/uL (ref 4.0–10.5)
nRBC: 0 % (ref 0.0–0.2)

## 2019-08-22 LAB — TROPONIN I (HIGH SENSITIVITY)
Troponin I (High Sensitivity): 6 ng/L (ref <18)
Troponin I (High Sensitivity): 7 ng/L (ref <18)

## 2019-08-22 LAB — FIBRIN DERIVATIVES D-DIMER (ARMC ONLY): Fibrin derivatives D-dimer (ARMC): 489.88 ng/mL (FEU) (ref 0.00–499.00)

## 2019-08-22 LAB — SARS CORONAVIRUS 2 BY RT PCR (HOSPITAL ORDER, PERFORMED IN ~~LOC~~ HOSPITAL LAB): SARS Coronavirus 2: NEGATIVE

## 2019-08-22 MED ORDER — AZITHROMYCIN 500 MG PO TABS: 500.0000 mg | ORAL_TABLET | Freq: Every day | ORAL | 0 refills | Status: AC

## 2019-08-22 MED ORDER — IPRATROPIUM-ALBUTEROL 0.5-2.5 (3) MG/3ML IN SOLN
3.0000 mL | Freq: Once | RESPIRATORY_TRACT | Status: AC
  Administered 2019-08-22: 3 mL via RESPIRATORY_TRACT
  Filled 2019-08-22: qty 3

## 2019-08-22 MED ORDER — PREDNISONE 20 MG PO TABS: 20.0000 mg | ORAL_TABLET | Freq: Every day | ORAL | 0 refills | Status: AC

## 2019-08-22 NOTE — ED Notes (Signed)
Pt cleared by MD to eat and drink. Pt given sandwich tray and ice water at this time

## 2019-08-22 NOTE — ED Triage Notes (Signed)
Pt states that she visited her family in [place] July 4th and came on July 11th and has been sick since, states cough, congestion, shortness of breath and now chest pain, pt reports that she is fully vaccinated for covid for the past couple months

## 2019-08-22 NOTE — ED Provider Notes (Signed)
[facility] Emergency Department Provider Note    First MD Initiated Contact with Patient 08/22/19 1725     (approximate)  I have reviewed the triage vital signs and the nursing notes.   HISTORY  Chief Complaint Chest Pain, Cough, and Shortness of Breath    HPI Theresa Leon is a 64 y.o. female below listed past medical history presents to the ER for evaluation of left-sided migrating chest pain is described as sharp in nature and aggravating.  Has been ongoing for the past 2 days.  Does have worsening pain when taking deep breath.  States that she was recently visiting family in [place] and several of the your children were sick with upper respiratory symptoms.  She is vaccinated against Covid.    Past Medical History:  Diagnosis Date  . Acute on chronic renal failure ([place])   . Anemia   . Asthma 1975  . COPD (chronic obstructive pulmonary disease) ([place])   . Diabetes ([place])   . Diabetes mellitus without complication ([place]) 4696  . Diastolic CHF, acute on chronic (HCC)   . H/O aortic valve replacement   . Heart disease   . Heart murmur   . Hypothyroid   . Kidney stone   . Liver cirrhosis secondary to NASH (nonalcoholic steatohepatitis) ([place])   . Morbid obesity ([place])   . 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 OSA (obstructive sleep apnea)    CPAP  . Presence of permanent cardiac pacemaker    Family History  Problem Relation Age of Onset  . Skin telangiectasia Mother   . Paget's disease of bone Mother   . Cancer Mother        skin  . Breast cancer Maternal Grandmother   . Cancer Maternal Grandmother        ovarian, pancreatic  . Alzheimer's disease Father   . Atrial fibrillation Father   . Lung cancer Maternal Grandfather    Past Surgical History:  Procedure Laterality Date  . aortic valve relaced   2008  . BREAST BIOPSY Left 04/24/2011   left breast bx -neg done by dr byrnett  . BREAST SURGERY   September 25, 2011   intraductal papilloma of the left breast  . CHOLECYSTECTOMY  2007  . COLONOSCOPY WITH PROPOFOL N/A 08/04/2014   Procedure: COLONOSCOPY WITH PROPOFOL;  Surgeon: Manya Silvas, MD;  Location: [facility] ENDOSCOPY;  Service: Endoscopy;  Laterality: N/A;  . CYSTOSCOPY W/ RETROGRADES Right 02/18/2019   Procedure: CYSTOSCOPY WITH RETROGRADE PYELOGRAM;  Surgeon: Billey Co, MD;  Location: [facility] ORS;  Service: Urology;  Laterality: Right;  . CYSTOSCOPY W/ RETROGRADES Right 03/05/2019   Procedure: CYSTOSCOPY WITH RETROGRADE PYELOGRAM;  Surgeon: Billey Co, MD;  Location: [facility] ORS;  Service: Urology;  Laterality: Right;  . CYSTOSCOPY/URETEROSCOPY/HOLMIUM LASER/STENT PLACEMENT Right 02/18/2019   Procedure: CYSTOSCOPY/URETEROSCOPY/HOLMIUM LASER/STENT PLACEMENT;  Surgeon: Billey Co, MD;  Location: [facility] ORS;  Service: Urology;  Laterality: Right;  . CYSTOSCOPY/URETEROSCOPY/HOLMIUM LASER/STENT PLACEMENT Right 03/05/2019   Procedure: CYSTOSCOPY/URETEROSCOPY/HOLMIUM LASER/STENT EXCHANGE;  Surgeon: Billey Co, MD;  Location: [facility] ORS;  Service: Urology;  Laterality: Right;  . ESOPHAGOGASTRODUODENOSCOPY Left 03/03/2015   Procedure: ESOPHAGOGASTRODUODENOSCOPY (EGD);  Surgeon: Manya Silvas, MD;  Location: [facility] ENDOSCOPY;  Service: Endoscopy;  Laterality: Left;  . ESOPHAGOGASTRODUODENOSCOPY (EGD) WITH PROPOFOL  08/04/2014   Procedure: ESOPHAGOGASTRODUODENOSCOPY (EGD) WITH PROPOFOL;  Surgeon: Manya Silvas, MD;  Location: [facility] ENDOSCOPY;  Service: Endoscopy;;  . ESOPHAGOGASTRODUODENOSCOPY (  EGD) WITH PROPOFOL N/A 04/09/2016   Procedure: ESOPHAGOGASTRODUODENOSCOPY (EGD) WITH PROPOFOL;  Surgeon: Lucilla Lame, MD;  Location: [facility] ENDOSCOPY;  Service: Endoscopy;  Laterality: N/A;  . EXTRACORPOREAL SHOCK WAVE LITHOTRIPSY Right 02/11/2019   Procedure: EXTRACORPOREAL SHOCK WAVE LITHOTRIPSY (ESWL);  Surgeon: Hollice Espy, MD;  Location: [facility] ORS;  Service: Urology;  Laterality: Right;  .  EXTRACORPOREAL SHOCK WAVE LITHOTRIPSY Right 02/18/2019   Procedure: EXTRACORPOREAL SHOCK WAVE LITHOTRIPSY (ESWL);  Surgeon: Billey Co, MD;  Location: [facility] ORS;  Service: Urology;  Laterality: Right;  . PACEMAKER INSERTION    . PHRENIC NERVE PACEMAKER IMPLANTATION  2009  . TRACHEOSTOMY TUBE PLACEMENT N/A 02/04/2014   Procedure: TRACHEOSTOMY;  Surgeon: Rozetta Nunnery, MD;  Location: [facility];  Service: ENT;  Laterality: N/A;   Patient Active Problem List   Diagnosis Date Noted  . Chest pain 03/05/2017  . V tach ([place]) 09/18/2015  . Iron deficiency anemia due to chronic blood loss 05/03/2015  . Gastritis due to nonsteroidal anti-inflammatory drug (NSAID) 03/05/2015  . History of esophagogastroduodenoscopy (EGD) 03/05/2015  . Type 2 diabetes mellitus ([place]) 03/01/2015  . GERD (gastroesophageal reflux disease) 03/01/2015  . HLD (hyperlipidemia) 03/01/2015  . COPD (chronic obstructive pulmonary disease) ([place]) 03/01/2015  . Chronic diastolic CHF (congestive heart failure) ([place]) 03/01/2015  . Hypothyroidism 03/01/2015  . OSA on CPAP 03/01/2015  . Acute posthemorrhagic anemia 03/01/2015  . Dysphagia   . CHF (congestive heart failure) ([place])   . Acute respiratory failure with hypoxia ([place]) 01/26/2014  . Encounter for imaging study to confirm orogastric (OG) tube placement   . Respiratory failure ([place])   . Intraductal papilloma of left breast 09/17/2011      Prior to Admission medications   Medication Sig Start Date End Date Taking? Authorizing Provider  acetaminophen-codeine (TYLENOL #3) 300-30 MG tablet Take 1 tablet by mouth 5 (five) times daily as needed. 05/28/19   [provider]  albuterol (PROVENTIL HFA;VENTOLIN HFA) 108 (90 BASE) MCG/ACT inhaler Inhale 2 puffs into the lungs every 6 (six) hours as needed for wheezing or shortness of breath.     [provider]  azithromycin (ZITHROMAX) 500 MG tablet Take 1 tablet (500 mg total) by mouth daily for 3 days. Take 1  tablet daily for 3 days. 08/22/19 08/25/19  Merlyn Lot, MD  Calcium Carbonate-Vitamin D (CALCIUM 600+D) 600-400 MG-UNIT tablet Take 1 tablet by mouth at bedtime.    [provider]  Cholecalciferol (VITAMIN D) 125 MCG (5000 UT) CAPS Take 5,000 mg by mouth daily.    [provider]  citalopram (CELEXA) 40 MG tablet Take 40 mg by mouth daily.    [provider]  DULoxetine (CYMBALTA) 30 MG capsule Take 30 mg by mouth daily. 02/02/19   [provider]  EPINEPHrine (EPIPEN 2-PAK) 0.3 mg/0.3 mL IJ SOAJ injection Inject 0.3 mg into the muscle once as needed (for severe allergic reaction).    [provider]  fluticasone (FLONASE) 50 MCG/ACT nasal spray Place 2 sprays into both nostrils daily as needed for rhinitis.     [provider]  gabapentin (NEURONTIN) 600 MG tablet Take 600 mg by mouth 4 (four) times daily.     [provider]  glimepiride (AMARYL) 2 MG tablet Take 2 mg by mouth 2 (two) times daily. 05/04/19   [provider]  Lancets (ONETOUCH DELICA PLUS [id number]) [place]  06/17/19   [provider]  levothyroxine (SYNTHROID, LEVOTHROID) 125 MCG tablet Take 125 mcg by mouth daily before  breakfast.     [provider]  losartan (COZAAR) 25 MG tablet Take 25 mg by mouth daily. 06/21/19   [provider]  metFORMIN (GLUCOPHAGE) 500 MG tablet Take 750 mg by mouth 2 (two) times daily. 02/16/19   [provider]  metoprolol succinate (TOPROL-XL) 25 MG 24 hr tablet  04/20/19   [provider]  montelukast (SINGULAIR) 10 MG tablet Take 10 mg by mouth at bedtime.    [provider]  naproxen sodium (ALEVE) 220 MG tablet Take 440 mg by mouth daily as needed (pain).    [provider]  predniSONE (DELTASONE) 20 MG tablet Take 1 tablet (20 mg total) by mouth daily for 5 days. 08/22/19 08/27/19  Merlyn Lot, MD  sucralfate (CARAFATE) 1 g tablet Take 1 g by mouth 3 (three) times  daily.    [provider]  tamsulosin (FLOMAX) 0.4 MG CAPS capsule Take 1 capsule (0.4 mg total) by mouth daily. 06/29/19   Harvest Dark, MD  torsemide (DEMADEX) 20 MG tablet Take 20 mg by mouth every other day.  06/03/17   [provider]  traZODone (DESYREL) 100 MG tablet Take 100 mg by mouth at bedtime.    [provider]  XTAMPZA ER 9 MG C12A Take 9 mg by mouth 2 (two) times daily.  06/11/17   [provider]    Allergies Mold extract [trichophyton], Tetanus toxoids, Furosemide, Hctz [hydrochlorothiazide], Other, Qvar [beclomethasone], Toprol xl [metoprolol tartrate], Nickel, and Nitroglycerin    Social History Social History   Tobacco Use  . Smoking status: Former Smoker    Quit date: 07/28/2008    Years since quitting: 11.0  . Smokeless tobacco: Never Used  Vaping Use  . Vaping Use: Never used  Substance Use Topics  . Alcohol use: No  . Drug use: No    Review of Systems Patient denies headaches, rhinorrhea, blurry vision, numbness, shortness of breath, chest pain, edema, cough, abdominal pain, nausea, vomiting, diarrhea, dysuria, fevers, rashes or hallucinations unless otherwise stated above in HPI. ____________________________________________   PHYSICAL EXAM:  VITAL SIGNS: Vitals:   08/22/19 1930 08/22/19 1948  BP: 119/69   Pulse: 74 79  Resp: 14 12  Temp:    SpO2: 91% 100%    Constitutional: Alert and oriented.  Eyes: Conjunctivae are normal.  Head: Atraumatic. Nose: No congestion/rhinnorhea. Mouth/Throat: Mucous membranes are moist.   Neck: No stridor. Painless ROM.  Cardiovascular: Normal rate, regular rhythm. Grossly normal heart sounds.  Good peripheral circulation. Respiratory: Normal respiratory effort.  No retractions. Lungs with scattered wheeze throughout Gastrointestinal: Soft and nontender. No distention. No abdominal bruits. No CVA tenderness. Genitourinary:  Musculoskeletal: No lower extremity tenderness,,  trace ble edema.  No joint effusions. Neurologic:  Normal speech and language. No gross focal neurologic deficits are appreciated. No facial droop Skin:  Skin is warm, dry and intact. No rash noted. Psychiatric: Mood and affect are normal. Speech and behavior are normal.  ____________________________________________   LABS (all labs ordered are listed, but only abnormal results are displayed)  Results for orders placed or performed during the hospital encounter of 08/22/19 (from the past 24 hour(s))  Basic metabolic panel     Status: Abnormal   Collection Time: 08/22/19  2:19 PM  Result Value Ref Range   Sodium 137 135 - 145 mmol/L   Potassium 4.7 3.5 - 5.1 mmol/L   Chloride 100 98 - 111 mmol/L   CO2 27 22 - 32 mmol/L   Glucose, Bld  114 (H) 70 - 99 mg/dL   BUN 16 8 - 23 mg/dL   Creatinine, Ser 1.01 (H) 0.44 - 1.00 mg/dL   Calcium 9.5 8.9 - 10.3 mg/dL   GFR calc non Af Amer 60 (L) >60 mL/min   GFR calc Af Amer >60 >60 mL/min   Anion gap 10 5 - 15  CBC     Status: Abnormal   Collection Time: 08/22/19  2:19 PM  Result Value Ref Range   WBC 6.4 4.0 - 10.5 K/uL   RBC 4.61 3.87 - 5.11 MIL/uL   Hemoglobin 14.3 12.0 - 15.0 g/dL   HCT 42.9 36 - 46 %   MCV 93.1 80.0 - 100.0 fL   MCH 31.0 26.0 - 34.0 pg   MCHC 33.3 30.0 - 36.0 g/dL   RDW 13.0 11.5 - 15.5 %   Platelets 105 (L) 150 - 400 K/uL   nRBC 0.0 0.0 - 0.2 %  Troponin I (High Sensitivity)     Status: None   Collection Time: 08/22/19  2:19 PM  Result Value Ref Range   Troponin I (High Sensitivity) 6 <18 ng/L  Troponin I (High Sensitivity)     Status: None   Collection Time: 08/22/19  6:03 PM  Result Value Ref Range   Troponin I (High Sensitivity) 7 <18 ng/L  Fibrin derivatives D-Dimer ([facility] only)     Status: None   Collection Time: 08/22/19  6:03 PM  Result Value Ref Range   Fibrin derivatives D-dimer ([facility]) 489.88 0.00 - 499.00 ng/mL (FEU)   ____________________________________________  EKG My review and personal  interpretation at Time: 14:03   Indication: chest pain  Rate: 80  Rhythm: sinus Axis: left Other: normal intervals, lvh, poor r wave progression, abnml ekg ____________________________________________  RADIOLOGY  I personally reviewed all radiographic images ordered to evaluate for the above acute complaints and reviewed radiology reports and findings.  These findings were personally discussed with the patient.  Please see medical record for radiology report.  ____________________________________________   PROCEDURES  Procedure(s) performed:  Procedures    Critical Care performed: no ____________________________________________   INITIAL IMPRESSION / ASSESSMENT AND PLAN / ED COURSE  Pertinent labs & imaging results that were available during my care of the patient were reviewed by me and considered in my medical decision making (see chart for details).   DDX: ACS, pericarditis, esophagitis, boerhaaves, pe, dissection, pna, bronchitis, costochondritis   Theresa Leon is a 64 y.o. who presents to the ED with symptoms as described above.  Patient does have wheezing on exam therefore was given neb.  EKG is nonischemic with 2 - enzymes.  Does not seem consistent with ACS.  D-dimer is negative.  Does not seem to be consistent with PE.  Not consistent with dissection.  Pain is somewhat reproducible but given the wheezing I do suspect a component of bronchitis.  Covid is pending but she is already vaccinated and does not have any other markers of sirs criteria or indication for which she requires admission the hospital therefore will discharge with treatment for bronchitis and discussed signs and symptoms for which she should return to the ER.     The patient was evaluated in Emergency Department today for the symptoms described in the history of present illness. He/she was evaluated in the context of the global COVID-19 pandemic, which necessitated consideration that the patient might be  at risk for infection with the SARS-CoV-2 virus that causes COVID-19. Institutional protocols and algorithms that pertain  to the evaluation of patients at risk for COVID-19 are in a state of rapid change based on information released by regulatory bodies including the CDC and federal and state organizations. These policies and algorithms were followed during the patient's care in the ED.  As part of my medical decision making, I reviewed the following data within the [place] notes reviewed and incorporated, Labs reviewed, notes from prior ED visits and [place] Controlled Substance Database   ____________________________________________   FINAL CLINICAL IMPRESSION(S) / ED DIAGNOSES  Final diagnoses:  Cough  Atypical chest pain      NEW MEDICATIONS STARTED DURING THIS VISIT:  New Prescriptions   AZITHROMYCIN (ZITHROMAX) 500 MG TABLET    Take 1 tablet (500 mg total) by mouth daily for 3 days. Take 1 tablet daily for 3 days.   PREDNISONE (DELTASONE) 20 MG TABLET    Take 1 tablet (20 mg total) by mouth daily for 5 days.     Note:  This document was prepared using Dragon voice recognition software and may include unintentional dictation errors.    Merlyn Lot, MD 08/22/19 2023

## 2019-08-22 NOTE — ED Notes (Signed)
Pt states coming in for chest pain and SOB when up and walking. Pt states she does not use oxygen at home, just cpap to sleep with. Pt on pulse ox, cardiac monitor and bp cuff.

## 2019-09-27 ENCOUNTER — Encounter: Payer: Self-pay | Admitting: *Deleted

## 2019-09-27 ENCOUNTER — Emergency Department
Admission: EM | Admit: 2019-09-27 | Discharge: 2019-09-27 | Disposition: A | Discharge status: Home / Self Care | Payer: Medicare Other | Attending: Emergency Medicine | Admitting: Emergency Medicine

## 2019-09-27 ENCOUNTER — Emergency Department: Payer: Medicare Other

## 2019-09-27 ENCOUNTER — Other Ambulatory Visit: Payer: Self-pay

## 2019-09-27 DIAGNOSIS — Y9289 Other specified places as the place of occurrence of the external cause: Secondary | ICD-10-CM | POA: Diagnosis not present

## 2019-09-27 DIAGNOSIS — Z87891 Personal history of nicotine dependence: Secondary | ICD-10-CM | POA: Insufficient documentation

## 2019-09-27 DIAGNOSIS — Z950 Presence of cardiac pacemaker: Secondary | ICD-10-CM | POA: Insufficient documentation

## 2019-09-27 DIAGNOSIS — M545 Low back pain: Secondary | ICD-10-CM | POA: Diagnosis not present

## 2019-09-27 DIAGNOSIS — J449 Chronic obstructive pulmonary disease, unspecified: Secondary | ICD-10-CM | POA: Diagnosis not present

## 2019-09-27 DIAGNOSIS — E039 Hypothyroidism, unspecified: Secondary | ICD-10-CM | POA: Diagnosis not present

## 2019-09-27 DIAGNOSIS — Y939 Activity, unspecified: Secondary | ICD-10-CM | POA: Insufficient documentation

## 2019-09-27 DIAGNOSIS — W19XXXA Unspecified fall, initial encounter: Secondary | ICD-10-CM | POA: Diagnosis not present

## 2019-09-27 DIAGNOSIS — M25511 Pain in right shoulder: Secondary | ICD-10-CM | POA: Diagnosis not present

## 2019-09-27 DIAGNOSIS — E119 Type 2 diabetes mellitus without complications: Secondary | ICD-10-CM | POA: Insufficient documentation

## 2019-09-27 DIAGNOSIS — Y999 Unspecified external cause status: Secondary | ICD-10-CM | POA: Diagnosis not present

## 2019-09-27 DIAGNOSIS — C50912 Malignant neoplasm of unspecified site of left female breast: Secondary | ICD-10-CM | POA: Diagnosis not present

## 2019-09-27 DIAGNOSIS — Z79899 Other long term (current) drug therapy: Secondary | ICD-10-CM | POA: Insufficient documentation

## 2019-09-27 DIAGNOSIS — Z7984 Long term (current) use of oral hypoglycemic drugs: Secondary | ICD-10-CM | POA: Insufficient documentation

## 2019-09-27 MED ORDER — OXYCODONE-ACETAMINOPHEN 5-325 MG PO TABS
1.0000 | ORAL_TABLET | Freq: Once | ORAL | Status: AC
  Administered 2019-09-27: 1 via ORAL
  Filled 2019-09-27: qty 1

## 2019-09-27 MED ORDER — ONDANSETRON 4 MG PO TBDP
4.0000 mg | ORAL_TABLET | Freq: Once | ORAL | Status: AC
  Administered 2019-09-27: 4 mg via ORAL
  Filled 2019-09-27: qty 1

## 2019-09-27 NOTE — ED Notes (Signed)
Pt states that she fell out of a chair today and that she fell on her spine and her shoulders. Pt denies hitting her head or losing consciousness.

## 2019-09-27 NOTE — ED Provider Notes (Signed)
Emergency Department Provider Note  ____________________________________________  Time seen: Approximately 9:03 PM  I have reviewed the triage vital signs and the nursing notes.   HISTORY  Chief Complaint Fall and Shoulder Pain   Historian Patient     HPI Theresa Leon is a 64 y.o. female presents to the emergency department with acute right shoulder pain and low back pain after patient fell from a barstool.  Patient states that she has "bad knees".  She states that she was sitting on a barstool while cooking when the stool suddenly fell apart.  She denies hitting her head or her neck.  No numbness or tingling in the upper and lower extremities.  No chest pain, chest tightness or abdominal pain.   Past Medical History:  Diagnosis Date  . Acute on chronic renal failure ([place])   . Anemia   . Asthma 1975  . COPD (chronic obstructive pulmonary disease) ([place])   . Diabetes ([place])   . Diabetes mellitus without complication ([place]) 0076  . Diastolic CHF, acute on chronic (HCC)   . H/O aortic valve replacement   . Heart disease   . Heart murmur   . Hypothyroid   . Kidney stone   . Liver cirrhosis secondary to NASH (nonalcoholic steatohepatitis) ([place])   . Morbid obesity ([place])   . 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 OSA (obstructive sleep apnea)    CPAP  . Presence of permanent cardiac pacemaker      Immunizations up to date:  Yes.     Past Medical History:  Diagnosis Date  . Acute on chronic renal failure ([place])   . Anemia   . Asthma 1975  . COPD (chronic obstructive pulmonary disease) ([place])   . Diabetes ([place])   . Diabetes mellitus without complication ([place]) 2263  . Diastolic CHF, acute on chronic (HCC)   . H/O aortic valve replacement   . Heart disease   . Heart murmur   . Hypothyroid   . Kidney stone   . Liver cirrhosis secondary to NASH (nonalcoholic steatohepatitis) ([place])   . Morbid obesity ([place])   . Neoplasm of  unspecified nature of breast 2013   papilloma, left breast   . OSA (obstructive sleep apnea)    CPAP  . Presence of permanent cardiac pacemaker     Patient Active Problem List   Diagnosis Date Noted  . Chest pain 03/05/2017  . V tach ([place]) 09/18/2015  . Iron deficiency anemia due to chronic blood loss 05/03/2015  . Gastritis due to nonsteroidal anti-inflammatory drug (NSAID) 03/05/2015  . History of esophagogastroduodenoscopy (EGD) 03/05/2015  . Type 2 diabetes mellitus ([place]) 03/01/2015  . GERD (gastroesophageal reflux disease) 03/01/2015  . HLD (hyperlipidemia) 03/01/2015  . COPD (chronic obstructive pulmonary disease) ([place]) 03/01/2015  . Chronic diastolic CHF (congestive heart failure) ([place]) 03/01/2015  . Hypothyroidism 03/01/2015  . OSA on CPAP 03/01/2015  . Acute posthemorrhagic anemia 03/01/2015  . Dysphagia   . CHF (congestive heart failure) ([place])   . Acute respiratory failure with hypoxia ([place]) 01/26/2014  . Encounter for imaging study to confirm orogastric (OG) tube placement   . Respiratory failure ([place])   . 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 aortic valve relaced   2008  . BREAST BIOPSY Left 04/24/2011   left breast bx -neg done by dr byrnett  . BREAST SURGERY  September 25, 2011  intraductal papilloma of the left breast  . CHOLECYSTECTOMY  2007  . COLONOSCOPY WITH PROPOFOL N/A 08/04/2014   Procedure: COLONOSCOPY WITH PROPOFOL;  Surgeon: Manya Silvas, MD;  Location: [facility] ENDOSCOPY;  Service: Endoscopy;  Laterality: N/A;  . CYSTOSCOPY W/ RETROGRADES Right 02/18/2019   Procedure: CYSTOSCOPY WITH RETROGRADE PYELOGRAM;  Surgeon: Billey Co, MD;  Location: [facility] ORS;  Service: Urology;  Laterality: Right;  . CYSTOSCOPY W/ RETROGRADES Right 03/05/2019   Procedure: CYSTOSCOPY WITH RETROGRADE PYELOGRAM;  Surgeon: Billey Co, MD;  Location: [facility] ORS;  Service: Urology;  Laterality: Right;  .  CYSTOSCOPY/URETEROSCOPY/HOLMIUM LASER/STENT PLACEMENT Right 02/18/2019   Procedure: CYSTOSCOPY/URETEROSCOPY/HOLMIUM LASER/STENT PLACEMENT;  Surgeon: Billey Co, MD;  Location: [facility] ORS;  Service: Urology;  Laterality: Right;  . CYSTOSCOPY/URETEROSCOPY/HOLMIUM LASER/STENT PLACEMENT Right 03/05/2019   Procedure: CYSTOSCOPY/URETEROSCOPY/HOLMIUM LASER/STENT EXCHANGE;  Surgeon: Billey Co, MD;  Location: [facility] ORS;  Service: Urology;  Laterality: Right;  . ESOPHAGOGASTRODUODENOSCOPY Left 03/03/2015   Procedure: ESOPHAGOGASTRODUODENOSCOPY (EGD);  Surgeon: Manya Silvas, MD;  Location[facility] ENDOSCOPY;  Service: Endoscopy;  Laterality: Left;  . ESOPHAGOGASTRODUODENOSCOPY (EGD) WITH PROPOFOL  08/04/2014   Procedure: ESOPHAGOGASTRODUODENOSCOPY (EGD) WITH PROPOFOL;  Surgeon: Manya Silvas, MD;  Location: [facility] ENDOSCOPY;  Service: Endoscopy;;  . ESOPHAGOGASTRODUODENOSCOPY (EGD) WITH PROPOFOL N/A 04/09/2016   Procedure: ESOPHAGOGASTRODUODENOSCOPY (EGD) WITH PROPOFOL;  Surgeon: Lucilla Lame, MD;  Location: [facility] ENDOSCOPY;  Service: Endoscopy;  Laterality: N/A;  . EXTRACORPOREAL SHOCK WAVE LITHOTRIPSY Right 02/11/2019   Procedure: EXTRACORPOREAL SHOCK WAVE LITHOTRIPSY (ESWL);  Surgeon: Hollice Espy, MD;  Location: [facility] ORS;  Service: Urology;  Laterality: Right;  . EXTRACORPOREAL SHOCK WAVE LITHOTRIPSY Right 02/18/2019   Procedure: EXTRACORPOREAL SHOCK WAVE LITHOTRIPSY (ESWL);  Surgeon: Billey Co, MD;  Location: [facility] ORS;  Service: Urology;  Laterality: Right;  . PACEMAKER INSERTION    . PHRENIC NERVE PACEMAKER IMPLANTATION  2009  . TRACHEOSTOMY TUBE PLACEMENT N/A 02/04/2014   Procedure: TRACHEOSTOMY;  Surgeon: Rozetta Nunnery, MD;  Location: [place];  Service: ENT;  Laterality: N/A;    Prior to Admission medications   Medication Sig Start Date End Date Taking? Authorizing Provider  acetaminophen-codeine (TYLENOL #3) 300-30 MG tablet Take 1 tablet by mouth 5 (five) times daily as needed.  05/28/19   [provider]  albuterol (PROVENTIL HFA;VENTOLIN HFA) 108 (90 BASE) MCG/ACT inhaler Inhale 2 puffs into the lungs every 6 (six) hours as needed for wheezing or shortness of breath.     [provider]  Calcium Carbonate-Vitamin D (CALCIUM 600+D) 600-400 MG-UNIT tablet Take 1 tablet by mouth at bedtime.    [provider]  Cholecalciferol (VITAMIN D) 125 MCG (5000 UT) CAPS Take 5,000 mg by mouth daily.    [provider]  citalopram (CELEXA) 40 MG tablet Take 40 mg by mouth daily.    [provider]  DULoxetine (CYMBALTA) 30 MG capsule Take 30 mg by mouth daily. 02/02/19   [provider]  EPINEPHrine (EPIPEN 2-PAK) 0.3 mg/0.3 mL IJ SOAJ injection Inject 0.3 mg into the muscle once as needed (for severe allergic reaction).    [provider]  fluticasone (FLONASE) 50 MCG/ACT nasal spray Place 2 sprays into both nostrils daily as needed for rhinitis.     [provider]  gabapentin (NEURONTIN) 600 MG tablet Take 600 mg by mouth 4 (four) times daily.     [provider]  glimepiride (AMARYL) 2 MG tablet Take 2 mg by mouth 2 (two) times daily. 05/04/19   [provider]  Lancets (ONETOUCH DELICA PLUS [id number]) [place]  06/17/19   [provider]  levothyroxine (SYNTHROID, LEVOTHROID) 125 MCG tablet Take 125 mcg by mouth daily before breakfast.     [provider]  losartan (COZAAR) 25 MG tablet Take 25 mg by mouth daily. 06/21/19   [provider]  metFORMIN (GLUCOPHAGE) 500 MG tablet Take 750 mg by mouth 2 (two) times daily. 02/16/19   [provider]  metoprolol succinate (TOPROL-XL) 25 MG 24 hr tablet  04/20/19   [provider]  montelukast (SINGULAIR) 10 MG tablet Take 10 mg by mouth at bedtime.    [provider]  naproxen sodium (ALEVE) 220 MG tablet Take 440 mg by mouth daily as needed (pain).    [provider]  sucralfate (CARAFATE) 1 g  tablet Take 1 g by mouth 3 (three) times daily.    [provider]  tamsulosin (FLOMAX) 0.4 MG CAPS capsule Take 1 capsule (0.4 mg total) by mouth daily. 06/29/19   Harvest Dark, MD  torsemide (DEMADEX) 20 MG tablet Take 20 mg by mouth every other day.  06/03/17   [provider]  traZODone (DESYREL) 100 MG tablet Take 100 mg by mouth at bedtime.    [provider]  XTAMPZA ER 9 MG C12A Take 9 mg by mouth 2 (two) times daily.  06/11/17   [provider]    Allergies Mold extract [trichophyton], Tetanus toxoids, Furosemide, Hctz [hydrochlorothiazide], Other, Qvar [beclomethasone], Toprol xl [metoprolol tartrate], Nickel, and Nitroglycerin  Family History  Problem Relation Age of Onset  . Skin telangiectasia Mother   . Paget's disease of bone Mother   . Cancer Mother        skin  . Breast cancer Maternal Grandmother   . Cancer Maternal Grandmother        ovarian, pancreatic  . Alzheimer's disease Father   . Atrial fibrillation Father   . Lung cancer Maternal Grandfather     Social History Social History   Tobacco Use  . Smoking status: Former Smoker    Quit date: 07/28/2008    Years since quitting: 11.1  . Smokeless tobacco: Never Used  Vaping Use  . Vaping Use: Never used  Substance Use Topics  . Alcohol use: No  . Drug use: No     Review of Systems  Constitutional: No fever/chills Eyes:  No discharge ENT: No upper respiratory complaints. Respiratory: no cough. No SOB/ use of accessory muscles to breath Gastrointestinal:   No nausea, no vomiting.  No diarrhea.  No constipation. Musculoskeletal: Patient has right shoulder pain and low back pain.  Skin: Negative for rash, abrasions, lacerations, ecchymosis.  ____________________________________________   PHYSICAL EXAM:  VITAL SIGNS: ED Triage Vitals  Enc Vitals Group     BP 09/27/19 2040 (!) 105/52     Pulse Rate 09/27/19 2040 75     Resp 09/27/19 2040 20     Temp 09/27/19  2040 98.5 F (36.9 C)     Temp Source 09/27/19 2040 Oral     SpO2 09/27/19 2040 95 %     Weight 09/27/19 2036 (!) 312 lb (141.5 kg)     Height 09/27/19 2036 5' 8"  (1.727 m)     Head Circumference --      Peak Flow --      Pain Score 09/27/19 2036 8     Pain Loc --      Pain Edu? --      Excl. in [place]? --  Constitutional: Alert and oriented. Well appearing and in no acute distress. Eyes: Conjunctivae are normal. PERRL. EOMI. Head: Atraumatic. ENT:      Nose: No congestion/rhinnorhea.      Mouth/Throat: Mucous membranes are moist.  Neck: No stridor.  No cervical spine tenderness to palpation. Cardiovascular: Normal rate, regular rhythm. Normal S1 and S2.  Good peripheral circulation. Respiratory: Normal respiratory effort without tachypnea or retractions. Lungs CTAB. Good air entry to the bases with no decreased or absent breath sounds Gastrointestinal: Bowel sounds x 4 quadrants. Soft and nontender to palpation. No guarding or rigidity. No distention. Musculoskeletal: Patient demonstrates limited range of motion of the right shoulder, likely secondary to pain.  Patient has paraspinal muscle tenderness along lumbar spine. Neurologic:  Normal for age. No gross focal neurologic deficits are appreciated.  Skin:  Skin is warm, dry and intact. No rash noted. Psychiatric: Mood and affect are normal for age. Speech and behavior are normal.   ____________________________________________   LABS (all labs ordered are listed, but only abnormal results are displayed)  Labs Reviewed - No data to display ____________________________________________  EKG   ____________________________________________  RADIOLOGY Unk Pinto, personally viewed and evaluated these images (plain radiographs) as part of my medical decision making, as well as reviewing the written report by the radiologist.   DG Lumbar Spine 2-3 Views  Result Date: 09/27/2019 CLINICAL DATA:  Fall EXAM: LUMBAR SPINE  - 2-3 VIEW COMPARISON:  Radiograph 12/27/2014, CT 06/29/2019 FINDINGS: Five non-rib-bearing lumbar type vertebral levels. Mild levocurvature of the thoracolumbar junction with an apex at L1 level. Stable diffuse intervertebral disc height loss with discogenic and facet degenerative changes most pronounced along the right lateral aspect of the L1-2 level and at the L5-S1 level. No acute fracture vertebral body height loss is evident. No significant spondylolisthesis or spondylolysis. No suspicious osseous lesions. Included portions of bony pelvis are intact and congruent. Cholecystectomy clips in the right upper quadrant. Pacer lead noted at the cardiac apex. Inferior vena cava filter is present as well. IMPRESSION: 1. No acute osseous abnormality. 2. Stable diffuse degenerative disc disease and facet degenerative changes, most pronounced along the right lateral aspect of the L1-2 level and at the L5-S1 level. Electronically Signed   By: Lovena Le M.D.   On: 09/27/2019 22:11   DG Shoulder Right  Result Date: 09/27/2019 CLINICAL DATA:  Fall, right shoulder and back pain EXAM: RIGHT SHOULDER - 2+ VIEW COMPARISON:  CT chest 09/24/2016, radiograph 09/24/2016 FINDINGS: There is some chronic lucency towards the base of the glenoid which is similar to comparison and likely projectional. However, some new lucency seen along the 12 o'clock position of the glenoid labrum is more nonspecific and could reflect an acute injury in the setting of fall. 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 Evidence of prior pacer placement and sternotomy incompletely visualized on this exam. The included portions of the right chest are unremarkable. IMPRESSION: Chronic lucency towards the base of the glenoid which is similar to comparison and likely projectional. New lucency/possible fragmentation along the lucency seen along the 12 o'clock position of the glenoid labrum, age  indeterminate and possibly acute in the setting of fall. Consider CT evaluation. Background of mild glenohumeral and moderate acromioclavicular arthrosis. Electronically Signed   By: Lovena Le M.D.   On: 09/27/2019 22:09    ____________________________________________    PROCEDURES  Procedure(s) performed:     Procedures     Medications  oxyCODONE-acetaminophen (PERCOCET/ROXICET) 5-325  MG per tablet 1 tablet (1 tablet Oral Given 09/27/19 2113)  ondansetron (ZOFRAN-ODT) disintegrating tablet 4 mg (4 mg Oral Given 09/27/19 2113)  oxyCODONE-acetaminophen (PERCOCET/ROXICET) 5-325 MG per tablet 1 tablet (1 tablet Oral Given 09/27/19 2304)     ____________________________________________   INITIAL IMPRESSION / ASSESSMENT AND PLAN / ED COURSE  Pertinent labs & imaging results that were available during my care of the patient were reviewed by me and considered in my medical decision making (see chart for details).      Assessment and Plan:  Fall:  64 year old female presents to the emergency department with right shoulder pain and low back pain after a mechanical fall.  Vital signs are reassuring at triage.  On x-ray of the right shoulder, radiologist was concerned about a possible new lucency near the glenoid labrum.  I discussed findings with patient and she decided to follow-up with orthopedics before obtaining a CT of the right shoulder.  Patient was placed in a sling.  She is currently under a pain management contract.  She was advised to take prescribed pain medication at home.  Return precautions were given to return with new or worsening symptoms.  ____________________________________________  FINAL CLINICAL IMPRESSION(S) / ED DIAGNOSES  Final diagnoses:  Fall, initial encounter      NEW MEDICATIONS STARTED DURING THIS VISIT:  ED Discharge Orders    None          This chart was dictated using voice recognition software/Dragon. Despite best efforts to  proofread, errors can occur which can change the meaning. Any change was purely unintentional.     Lannie Fields, PA-C 09/27/19 2311    Arta Silence, MD 09/27/19 [phone]

## 2019-09-27 NOTE — ED Triage Notes (Addendum)
Pt brought in via ems from home.  Pt was sitting on a bar stool and it fell apart.  Pt struck right shoulder on door frame.  Limited rom.  Pt also has lower back pain.   No loc. No vomiting or h/a.  Pt alert  Speech clear.   Iv in place

## 2019-09-27 NOTE — ED Triage Notes (Signed)
EMS brought pt in fro home; reports sitting on a stool and fell; c/o rt shoulder and lower back pain

## 2019-09-27 NOTE — ED Notes (Addendum)
Patient declined discharge vital signs due to ride being here.

## 2019-10-05 ENCOUNTER — Other Ambulatory Visit: Payer: Self-pay | Admitting: Orthopedic Surgery

## 2019-10-05 DIAGNOSIS — M25511 Pain in right shoulder: Secondary | ICD-10-CM

## 2019-10-14 ENCOUNTER — Ambulatory Visit: Payer: Medicare Other | Admitting: Urology

## 2019-10-15 ENCOUNTER — Encounter: Payer: Self-pay | Admitting: Urology

## 2020-02-28 DIAGNOSIS — J339 Nasal polyp, unspecified: Secondary | ICD-10-CM

## 2020-02-28 NOTE — ED Notes (Signed)
Pt arrives to the ER for complaints of a nose bleed that started at 0830 today. Pt reports that she currently has a tampon in right nostril to control bleeding, reports that she has changed the tampon three times today.     Pt reports that at home she checked her blood pressure at home was reading 84/61 but upon arrival is in the 140's.     Pt also reports feeling weak and achy.     PMH of pacemaker, aorta valve replacement, type two diabetes.

## 2020-02-28 NOTE — ED Provider Notes (Signed)
ED Provider Notes by Hewitt Shorts, NP at 02/28/20 2131                Author: Hewitt Shorts, NP  Service: EMERGENCY  Author Type: Nurse Practitioner       Filed: 02/28/20 2302  Date of Service: 02/28/20 2131  Status: Attested           Editor: Hewitt Shorts, NP (Nurse Practitioner)  Cosigner: Diskin, Marye Round, MD at 03/01/20 1050          Attestation signed by Diskin, Marye Round, MD at 03/01/20 1050          I was personally available for consultation in the emergency department.  I have reviewed the chart and agree with the documentation recorded by the [facility], including  the assessment, treatment plan, and disposition.   Marye Round Diskin, MD                                    Patient is a 65 year old female with past medical history of diabetes who presents for evaluation of a nosebleed  that began at 830 this morning.  She has tried to stick tampons up her nose to stop the bleeding, but it kept continuing.  She denies taking any blood thinners.  Denies any other complaints.  She has no chest pain or shortness of breath.  She reports  she still has a tampon up her nose at this time.                      No past medical history on file.      No past surgical history on file.        No family history on file.        Social History          Socioeconomic History         ?  Marital status:  UNKNOWN              Spouse name:  Not on file         ?  Number of children:  Not on file     ?  Years of education:  Not on file     ?  Highest education level:  Not on file       Occupational History        ?  Not on file       Tobacco Use         ?  Smoking status:  Not on file     ?  Smokeless tobacco:  Not on file       Substance and Sexual Activity         ?  Alcohol use:  Not on file     ?  Drug use:  Not on file     ?  Sexual activity:  Not on file        Other Topics  Concern        ?  Not on file       Social History Narrative        ?  Not on file          Social Determinants of Health           Financial Resource Strain:         ?  Difficulty of Paying Living Expenses: Not  on file       Food Insecurity:         ?  Worried About Programme researcher, broadcasting/film/video in the Last Year: Not on file     ?  Ran Out of Food in the Last Year: Not on file       Transportation Needs:         ?  Lack of Transportation (Medical): Not on file     ?  Lack of Transportation (Non-Medical): Not on file       Physical Activity:         ?  Days of Exercise per Week: Not on file     ?  Minutes of Exercise per Session: Not on file       Stress:         ?  Feeling of Stress : Not on file       Social Connections:         ?  Frequency of Communication with Friends and Family: Not on file     ?  Frequency of Social Gatherings with Friends and Family: Not on file     ?  Attends Religious Services: Not on file     ?  Active Member of Clubs or Organizations: Not on file     ?  Attends Banker Meetings: Not on file     ?  Marital Status: Not on file       Intimate Partner Violence:         ?  Fear of Current or Ex-Partner: Not on file     ?  Emotionally Abused: Not on file     ?  Physically Abused: Not on file     ?  Sexually Abused: Not on file       Housing Stability:         ?  Unable to Pay for Housing in the Last Year: Not on file     ?  Number of Places Lived in the Last Year: Not on file        ?  Unstable Housing in the Last Year: Not on file              ALLERGIES: Hydrochlorothiazide      Review of Systems    Constitutional: Negative for unexpected weight change.    HENT: Negative for congestion.     Eyes: Negative for visual disturbance.    Respiratory: Negative for cough, chest tightness and shortness of breath.     Cardiovascular: Negative for chest pain and palpitations.    Gastrointestinal: Negative for abdominal pain.    Endocrine: Negative for polyuria.    Genitourinary: Negative for dysuria.    Musculoskeletal: Negative for back pain.    Skin: Negative for color change.    Allergic/Immunologic: Negative for  immunocompromised state.    Neurological: Negative for headaches.    Hematological: Negative for adenopathy.    Psychiatric/Behavioral: Negative for agitation.            Vitals:           02/28/20 2119  02/28/20 2127         BP:  (!) 151/86       Pulse:  65       Resp:    16     Temp:  97.7 ??F (36.5 ??C)       SpO2:  96%       Weight:  147.4 kg (325 lb)         Height:    5\' 7"  (1.702 m)                Physical Exam   Vitals and nursing note reviewed.   Constitutional:        Appearance: Normal appearance. She is normal weight.   HENT :       Head: Atraumatic.      Nose: Nose normal. No congestion or rhinorrhea.      Comments: Upon removal of tampon from right nare, it appears that bleeding has resolved.  She does have visible  nasal polyps in bilateral nares.   Eyes:       Conjunctiva/sclera: Conjunctivae normal.      Pupils: Pupils are equal, round, and reactive to light.   Cardiovascular:       Rate and Rhythm: Normal rate.   Pulmonary :       Effort: Pulmonary effort is normal. No respiratory distress.   Abdominal:      General: Abdomen is flat.     Musculoskeletal:          General: Normal range of motion.      Cervical back: Neck supple.    Skin:      General: Skin is warm and dry.      Capillary Refill: Capillary refill takes less than 2 seconds.    Neurological:       General: No focal deficit present.      Mental Status: She is alert and oriented to person, place, and time. Mental status is at baseline.   Psychiatric:         Mood and Affect: Mood normal.         Behavior: Behavior normal.              MDM   Number of Diagnoses or Management Options   Epistaxis   Nasal polyps   Diagnosis management comments: Patient presents for evaluation of epistaxis, which has now resolved prior to being evaluated in the emergency department.  Patient has stable vital signs, nontachycardic,  and stable appearing.  Educated patient on continued fracture of nose for 15 minutes at a time to stop nosebleed.  Educated that  she may additionally consider applying a humidifier for her room to prevent nosebleeds.  Discussed reasons to present to the  emergency department.      Discussed my clinical impression(s), any labs and/or radiology results with the patient. I answered any questions and addressed any concerns. Discussed the importance of following up with their primary care physician and/or specialist(s). Discussed signs  or symptoms that would warrant return back to the ER for further evaluation. The patient is agreeable with discharge.      Patient Progress   Patient progress: stable             Procedures

## 2020-02-28 NOTE — ED Notes (Signed)
Pt given discharge instructions prior to RN assessment, no primary RN assigned.

## 2020-02-29 ENCOUNTER — Inpatient Hospital Stay
Admit: 2020-02-29 | Discharge: 2020-02-29 | Disposition: A | Discharge status: Home / Self Care | Payer: MEDICARE | Attending: Emergency Medicine

## 2020-03-21 ENCOUNTER — Ambulatory Visit: Attending: Clinical Cardiac Electrophysiology | Primary: Family

## 2020-03-21 ENCOUNTER — Ambulatory Visit: Primary: Family

## 2020-03-21 ENCOUNTER — Institutional Professional Consult (permissible substitution): Primary: Family

## 2020-03-21 ENCOUNTER — Institutional Professional Consult (permissible substitution): Payer: MEDICARE | Primary: Family

## 2020-03-21 ENCOUNTER — Ambulatory Visit: Admit: 2020-03-21 | Payer: MEDICARE | Attending: Clinical Cardiac Electrophysiology | Primary: Family

## 2020-03-21 ENCOUNTER — Ambulatory Visit: Admit: 2020-03-21 | Discharge: 2020-03-21 | Payer: MEDICARE | Primary: Family

## 2020-03-21 DIAGNOSIS — Z950 Presence of cardiac pacemaker: Secondary | ICD-10-CM

## 2020-03-21 DIAGNOSIS — R002 Palpitations: Secondary | ICD-10-CM

## 2020-03-21 NOTE — Progress Notes (Signed)
Progress Notes  by Val Riles at 03/21/20 1430                Author: Val Riles  Service: --  Author Type: Technician       Filed: 04/10/20 1259  Encounter Date: 03/21/2020  Status: Signed          Editor: Val Riles (Technician)               MCT loop mailed per Dr Loma Newton dx: palps   Chargeable visit.

## 2020-03-21 NOTE — Progress Notes (Signed)
Echocardiogram results requested from [facility] - Dr. Yves Dill.

## 2020-03-21 NOTE — Progress Notes (Signed)
Room EP 3    Visit Vitals  BP 102/62 (BP 1 Location: Left upper arm, BP Patient Position: Sitting)   Pulse 64   Resp 16   Ht 5\' 7"  (1.702 m)   Wt 323 lb (146.5 kg)   SpO2 94%   BMI 50.59 kg/m       Chest pain: no    Shortness of breath: yes, comes and goes     Edema: no    Palpitations, Skipped beats, Rapid heartbeat: heart racing and it wakes her up     Dizziness: no    Fatigue:tired all the time    New diagnosis/Surgeries: no    1. Have you been to the ER, urgent care clinic since your last visit?  Hospitalized since your last visit? [facility] ED: 02/28/20-Epistaxis     2. Have you seen or consulted any other health care providers outside of the [facility] since your last visit?  Include any pap smears or colon screening. No     Refills:no

## 2020-03-21 NOTE — Progress Notes (Addendum)
Progress  Notes by Thurston Pounds, MD at 03/21/20 1340                Author: Thurston Pounds, MD  Service: --  Author Type: Physician       Filed: 04/12/20 0817  Encounter Date: 03/21/2020  Status: Addendum          Editor: Thurston Pounds, MD (Physician)          Related Notes: Original Note by Thurston Pounds, MD (Physician) filed at 03/30/20 [phone]                          Cardiac Electrophysiology OFFICE Consultation Note          Subjective:         Deborah [name] is a 65 y.o. patient  who is seen for evaluation of Pacemaker   She has Environmental manager dual chamber pacemaker   She had AVR in [place] and pacer implanted there for presumable av block   She Moved to [place] [employer] and saw Dr Welton Flakes (not with [facility])   She now changed practice to [facility] office   She has [employer] dual chamber pacer with 2.5 years of battery left   RA pacing 36%, RV pacing 29% on device check   She cannot use [employer] remote box due to older 200 series Altrua model   Echo ? Was done one year ago with Dr Welton Flakes when she saw him      She had used CPAP but not currently and wants to see sleep study specialist              Current Outpatient Medications          Medication  Sig  Dispense  Refill           ?  acetaminophen (Tylenol Extra Strength) 500 mg tablet  Take 1,000 mg by mouth every six (6) hours as needed for Pain.         ?  albuterol (PROVENTIL HFA, VENTOLIN HFA, PROAIR HFA) 90 mcg/actuation inhaler  Take 2 Puffs by inhalation every six (6) hours as needed.         ?  atorvastatin (LIPITOR) 80 mg tablet           ?  calcium-cholecalciferol, D3, (CALTRATE 600+D) tablet  Take 1 Tablet by mouth nightly.         ?  cholecalciferol (Vitamin D3) (1000 Units /25 mcg) tablet  Take 1,000 Units by mouth daily.         ?  citalopram (CELEXA) 40 mg tablet  Take 40 mg by mouth daily.         ?  fluticasone propionate (FLONASE) 50 mcg/actuation nasal spray  2 Sprays by Nasal route daily as needed.         ?  gabapentin (NEURONTIN) 600 mg tablet  TAKE 1 TABLET BY MOUTH 4  TIMES A DAY         ?  levothyroxine (SYNTHROID) 137 mcg tablet  TAKE 1 BY MOUTH DAILY, FOLLOW UP IN 90 DAYS FOR RECHECK THYROID LEVEL         ?  metFORMIN (GLUCOPHAGE) 500 mg tablet  Take 750 mg by mouth two (2) times daily (with meals).               ?  sucralfate (CARAFATE) 1 gram tablet  Take 1 g by mouth three (3) times daily.               ?  torsemide (DEMADEX) 20 mg tablet  TAKE 1 BY MOUTH DAILY F/U WITH CARDIOLOGY         ?  traZODone (DESYREL) 100 mg tablet  Take 100 mg by mouth nightly.         ?  EPINEPHrine (EPIPEN) 0.3 mg/0.3 mL injection           ?  Xtampza ER 9 mg capsule  TAKE 1 CAPSULE BY MOUTH EVERY 12 HOURS FILL 02/12/20         ?  cholecalciferol (VITAMIN D3) (5000 Units /125 mcg) capsule  Take 5,000 mg by mouth daily.         ?  glimepiride (AMARYL) 2 mg tablet  Take 2 mg by mouth two (2) times a day.               ?  montelukast (SINGULAIR) 10 mg tablet  Take 10 mg by mouth nightly.              Allergies        Allergen  Reactions         ?  Hydrochlorothiazide  Other (comments)             Reports 'kidneys dry up"           Past Medical History:        Diagnosis  Date         ?  Asthma       ?  Pacemaker       ?  Thyroid disease       ?  Valvular heart disease            Aortic valve replacement in 2010        surgical history AVR   No pacer in family history.     Social History          Tobacco Use         ?  Smoking status:  Former Smoker              Packs/day:  1.00         Years:  20.00         Pack years:  20.00         Quit date:  03/21/2010         Years since quitting:  10.0         ?  Smokeless tobacco:  Never Used       Substance Use Topics         ?  Alcohol use:  Not Currently            Review of Systems:    Constitutional: Negative for fever, chills, weight loss, + malaise/fatigue.    HEENT: Negative for nosebleeds, vision changes.    Respiratory: Negative for cough, hemoptysis   Cardiovascular: Negative for chest pain, palpitations, orthopnea, claudication, + leg swelling, no  syncope, and PND.    Gastrointestinal: Negative for nausea, vomiting, diarrhea, blood in stool and melena.    Genitourinary: Negative for dysuria, and hematuria.    Musculoskeletal: Negative for myalgias, + arthralgia.    Skin: Negative for rash.    Heme: Does not bleed or bruise easily.    Neurological: Negative for speech change           Objective:        Visit Vitals      BP  102/62 (BP 1 Location: Left upper arm, BP Patient Position:  Sitting)     Pulse  64     Resp  16     Ht  5\' 7"  (1.702 m)     Wt  323 lb (146.5 kg)     SpO2  94%        BMI  50.59 kg/m??         Physical Exam:    Constitutional: well-developed and well-nourished. No respiratory  distress.    Head: Normocephalic and atraumatic.    Eyes: Pupils are equal, round   ENT: hearing normal   Neck: supple. No JVD present.    Cardiovascular: Normal rate, regular rhythm. No murmur heard.   Pulmonary/Chest: Effort normal and breath sounds normal. No wheezes.    Abdominal: Soft, no tenderness. Morbidly obese  Musculoskeletal: 1+ leg edema.   Neurological: alert,oriented.    Skin: Skin is warm and dry sternal old scar and left chest pacer pocket is fine  Psychiatric: normal mood and affect. Behavior is normal. Judgment and thought content normal.              Assessment/Plan:                  ICD-10-CM  ICD-9-CM             1.  Pacemaker   Z95.0  V45.01       2.  CPAP (continuous positive airway pressure) dependence   Z99.89  V46.8  REFERRAL TO PULMONARY DISEASE     3.  S/P AVR   Z95.2  V43.3       4.  Morbid obesity (HCC)   E66.01  278.01             5.  OSA (obstructive sleep apnea)   G47.33  327.23       for pacemaker: it is checked today.  It was implanted in [place]   It has normal functions and she is not pacer dependent.     Battery 2.5 years left   Cannot check remotely due to old pacemaker type   She wants to be here for pacer check every 3 months      AVR: in [place]   Get echo result from previous cardiologist.  If cannot, will need to get echo done here       Refer to Dr for CPAP OSA and she agrees      Low BP but not dizzy      DM2 on metformin and cared for by PCP      Addendum   2 D echo report 03/22/2019 from Dr 03/24/2019 [facility]: difficult study and cannot evaluate AVR   Normal LVEF and mild MR TR      Therefore will get echo here in office to evaluate aortic valve      Addendum   Echo is severely limited   LVH present 1.2-1.4 cm   LVEF normal   Valves not well visualized   Doppler gradient obtained and showed moderate gradient   May need TEE      Loop recorder has shown PVCs and NSVT     Future Appointments           Date  Time  Provider  Department  Center           06/21/2020   1:40 PM  PACEMAKER, [facility]  [facility]  [facility]           Thank you for involving me in this patient's care and  please call with further concerns or questions.         Juliet Rude, M.D.   Electrophysiology/Cardiology   [facility]   [address] [place], [place] [address]                              [phone]

## 2020-03-21 NOTE — Progress Notes (Signed)
c/ pacer ck/thresholds  Establishing with practice  No events  2.5years to ERI    See scanned documents

## 2020-03-30 NOTE — Telephone Encounter (Signed)
LM for pt to call me sll

## 2020-04-04 NOTE — Telephone Encounter (Signed)
Telephone Encounter by Annitta Jersey, RN at 04/04/20 1452                Author: Annitta Jersey, RN  Service: --  Author Type: Registered Nurse       Filed: 04/04/20 1453  Encounter Date: 04/04/2020  Status: Signed          Editor: Annitta Jersey, RN (Registered Nurse)                 Renae Fickle, RN    You 1 minute ago (2:49 PM)          VO per Dr. Loma Newton Amiodarone 200 mg TID 2 weeks then decrease to BID         Attempted to reach patient by telephone. HIPAA compliant message was left for return call.

## 2020-04-04 NOTE — Telephone Encounter (Signed)
Patient returning your call.     [phone]

## 2020-04-04 NOTE — Telephone Encounter (Signed)
Received a call from [employer] that patient had 33 PVCs in 1 min. Tracing was faxed to office for review

## 2020-04-05 NOTE — Telephone Encounter (Signed)
Telephone Encounter by Annitta Jersey, RN at 04/05/20 [phone]                Author: Annitta Jersey, RN  Service: --  Author Type: Registered Nurse       Filed: 04/05/20 0913  Encounter Date: 04/04/2020  Status: Signed          Editor: Annitta Jersey, RN (Registered Nurse)                 Renae Fickle, RN    You 18 hours ago (2:49 PM)          VO per Dr. Loma Newton Amiodarone 200 mg TID 2 weeks then decrease to BID         Attempted to reach patient by telephone. HIPAA compliant message was left for return call.

## 2020-04-05 NOTE — Telephone Encounter (Signed)
I don't see that we have an ECG on file for her.  Need one prior to starting to make sure QTc isn't already significantly prolonged, then check again after she's been on the amiodarone x 1-2 weeks.

## 2020-04-05 NOTE — Telephone Encounter (Signed)
Patient is calling to follow up from pervious messages. Please Advise.    [phone]

## 2020-04-05 NOTE — Telephone Encounter (Signed)
Replied under separate open phone encounter.

## 2020-04-05 NOTE — Telephone Encounter (Signed)
Telephone Encounter by Annitta Jersey, RN at 04/05/20 1139                Author: Annitta Jersey, RN  Service: --  Author Type: Registered Nurse       Filed: 04/05/20 1202  Encounter Date: 04/04/2020  Status: Signed          Editor: Annitta Jersey, RN (Registered Nurse)                 French Ana, NP    You 12 minutes ago (11:25 AM)              I don't see that we have an ECG on file for her.  Need one prior to starting to make sure QTc isn't already significantly prolonged, then check  again after she's been on the amiodarone x 1-2 weeks.                Attempted to reach patient by telephone. HIPAA compliant message was left for return call.          12:01 PM Received a return call from the patient.  Notified patient of her frequent PVC's noted on her monitor.  Per Dr. Loma Newton he recommends she start Amiodarone but an EKG is needed first.  Patient agreeable and appointment made for an EKG only appointment  tomorrow.        Future Appointments           Date  Time  Provider  Department  Center           04/06/2020  11:00 AM  Arturo Morton, NP  [facility]  [facility]     04/10/2020   1:00 PM  VASCULAR, [facility]  [facility]  [facility]           06/21/2020   1:40 PM  [facility]ACEMAKER, [facility]  [facility]  [facility]

## 2020-04-06 ENCOUNTER — Ambulatory Visit: Attending: Nurse Practitioner | Primary: Family

## 2020-04-06 ENCOUNTER — Ambulatory Visit: Admit: 2020-04-06 | Payer: MEDICARE | Attending: Nurse Practitioner | Primary: Family

## 2020-04-06 DIAGNOSIS — R002 Palpitations: Secondary | ICD-10-CM

## 2020-04-06 MED ORDER — AMIODARONE 200 MG TAB
200 mg | ORAL_TABLET | Freq: Three times a day (TID) | ORAL | 2 refills | Status: DC
Start: 2020-04-06 — End: 2020-04-24

## 2020-04-06 NOTE — Telephone Encounter (Signed)
 Requested Prescriptions     Signed Prescriptions Disp Refills   . amiodarone  (CORDARONE ) 200 mg tablet 70 Tablet 2     Sig: Take 1 Tablet by mouth three (3) times daily. Take 1 tablet three (3) times a day for two weeks, then change to 1 tablet two (2) times daily.     Authorizing Provider: NGO, MATTHEW     Ordering User: VANICE DOMINO K     Patient seen in the office today for a baseline EKG prior to starting Amiodarone .  Follow up appointment made for repeat EKG in two weeks.    Medication education provided.    Future Appointments   Date Time Provider Department Center   04/10/2020  1:00 PM [facility]ASCULAR, [facility]   04/24/2020 11:00 AM Malcolm Leita [place], NP [facility]   06/21/2020  1:40 PM PACEMAKER, [facility]

## 2020-04-06 NOTE — Progress Notes (Signed)
Patient here for EKG only prior to the start of Amiodarone.    EKG done, prescription sent and medication education provided.    Follow up appointment made for repeat EKG in 2 weeks.    Future Appointments   Date Time Provider Department Center   04/10/2020  1:00 PM VASCULAR, [facility]   04/24/2020 11:00 AM Arturo Morton, NP [facility]   06/21/2020  1:40 PM PACEMAKER, [facility]

## 2020-04-10 ENCOUNTER — Ambulatory Visit

## 2020-04-10 ENCOUNTER — Ambulatory Visit: Admit: 2020-04-10 | Discharge: 2020-04-11 | Payer: MEDICARE | Primary: Family

## 2020-04-10 ENCOUNTER — Encounter

## 2020-04-10 DIAGNOSIS — Z952 Presence of prosthetic heart valve: Secondary | ICD-10-CM

## 2020-04-10 MED ORDER — SODIUM CHLORIDE 0.9 % INJECTION
1.1 mg/mL | Freq: Once | INTRAMUSCULAR | Status: AC
Start: 2020-04-10 — End: 2020-04-10
  Administered 2020-04-10: 18:00:00 via INTRAVENOUS

## 2020-04-10 NOTE — Progress Notes (Signed)
Progress  Notes by Thurston Pounds, MD at 04/10/20 1300                Author: Thurston Pounds, MD  Service: --  Author Type: Physician       Filed: 04/12/20 0819  Encounter Date: 04/10/2020  Status: Signed          Editor: Thurston Pounds, MD (Physician)               Echo is severely limited   LVH present 1.2-1.4 cm   LVEF normal   Valves not well visualized   Doppler gradient obtained and showed moderate gradient      Will see if pacer can be done with MRI or she needs TEE to examine AVR stenosis      Future Appointments   04/24/2020  11:00 AM   Arturo Morton, NP         [facility]               [facility] AMB   06/21/2020  1:40 PM    PACEMAKER, [facility]       [facility]               [facility] AMB

## 2020-04-12 LAB — ECHO ADULT COMPLETE
AV Mean Gradient: 37 mmHg
AV Mean Velocity: 2.9 m/s
AV Peak Gradient: 59 mmHg
AV Peak Velocity: 3.9 m/s
AV VTI: 92.2 cm
AV Velocity Ratio: 0.26
E/E' Lateral: 18.2
E/E' Ratio (Averaged): 16.68
E/E' Septal: 15.17
Est. RA Pressure: 15 mmHg
Fractional Shortening 2D: 34 % (ref 28–44)
IVSd: 1.3 cm — AB (ref 0.6–0.9)
LA Diameter: 3.7 cm
LA Size Index: 1.47 cm/m2
LA Volume 2C: 59 mL — AB (ref 22–52)
LA Volume 4C: 74 mL — AB (ref 22–52)
LA Volume A/L: 74 mL
LA Volume BP: 69 mL — AB (ref 22–52)
LA Volume Index 2C: 24 mL/m2 (ref 16–34)
LA Volume Index 4C: 29 mL/m2 (ref 16–34)
LA Volume Index A/L: 29 mL/m2 (ref 16–34)
LA Volume Index BP: 27 ml/m2 (ref 16–34)
LV E' Lateral Velocity: 5 cm/s
LV E' Septal Velocity: 6 cm/s
LV Mass 2D Index: 110.1 g/m2 — AB (ref 43–95)
LV Mass 2D: 276.4 g — AB (ref 67–162)
LV RWT Ratio: 0.56
LVIDd Index: 1.99 cm/m2
LVIDd: 5 cm (ref 3.9–5.3)
LVIDs Index: 1.31 cm/m2
LVIDs: 3.3 cm
LVOT Mean Gradient: 2 mmHg
LVOT Peak Gradient: 4 mmHg
LVOT Peak Velocity: 1 m/s
LVOT VTI: 24.3 cm
LVOT:AV VTI Index: 0.26
LVPWd: 1.4 cm — AB (ref 0.6–0.9)
MV A Velocity: 0.82 m/s
MV Area by PHT: 3.2 cm2
MV E Velocity: 0.91 m/s
MV E Wave Deceleration Time: 238.1 ms
MV E/A: 1.11
MV PHT: 69.1 ms
RV Free Wall Peak S': 10 cm/s
RVSP: 46 mmHg
TR Max Velocity: 2.8 m/s
TR Peak Gradient: 31 mmHg

## 2020-04-12 LAB — TRANSTHORACIC ECHOCARDIOGRAM (TTE) COMPLETE (CONTRAST/BUBBLE/3D PRN)
AV Mean Gradient: 37 mmHg
AV Mean Velocity: 2.9 m/s
AV Peak Gradient: 59 mmHg
AV Peak Velocity: 3.9 m/s
AV VTI: 92.2 cm
AV Velocity Ratio: 0.26
E/E' Lateral: 18.2
E/E' Ratio (Averaged): 16.68
E/E' Septal: 15.17
Est. RA Pressure: 15 mmHg
Fractional Shortening 2D: 34 % (ref 28–44)
IVSd: 1.3 cm — AB (ref 0.6–0.9)
LA Diameter: 3.7 cm
LA Size Index: 1.47 cm/m2
LA Volume 2C: 59 mL — AB (ref 22–52)
LA Volume 4C: 74 mL — AB (ref 22–52)
LA Volume A/L: 74 mL
LA Volume BP: 69 mL — AB (ref 22–52)
LA Volume Index 2C: 24 mL/m2 (ref 16–34)
LA Volume Index 4C: 29 mL/m2 (ref 16–34)
LA Volume Index A/L: 29 mL/m2 (ref 16–34)
LA Volume Index BP: 27 ml/m2 (ref 16–34)
LV E' Lateral Velocity: 5 cm/s
LV E' Septal Velocity: 6 cm/s
LV Mass 2D Index: 110.1 g/m2 — AB (ref 43–95)
LV Mass 2D: 276.4 g — AB (ref 67–162)
LV RWT Ratio: 0.56
LVIDd Index: 1.99 cm/m2
LVIDd: 5 cm (ref 3.9–5.3)
LVIDs Index: 1.31 cm/m2
LVIDs: 3.3 cm
LVOT Mean Gradient: 2 mmHg
LVOT Peak Gradient: 4 mmHg
LVOT Peak Velocity: 1 m/s
LVOT VTI: 24.3 cm
LVOT:AV VTI Index: 0.26
LVPWd: 1.4 cm — AB (ref 0.6–0.9)
Left Ventricular Ejection Fraction: 58
MV A Velocity: 0.82 m/s
MV Area by PHT: 3.2 cm2
MV E Velocity: 0.91 m/s
MV E Wave Deceleration Time: 238.1 ms
MV E/A: 1.11
MV PHT: 69.1 ms
RV Free Wall Peak S': 10 cm/s
RVSP: 46 mmHg
TR Max Velocity: 2.8 m/s
TR Peak Gradient: 31 mmHg

## 2020-04-24 ENCOUNTER — Ambulatory Visit: Attending: Nurse Practitioner | Primary: Family

## 2020-04-24 ENCOUNTER — Encounter

## 2020-04-24 ENCOUNTER — Ambulatory Visit: Admit: 2020-04-24 | Payer: MEDICARE | Attending: Nurse Practitioner | Primary: Family

## 2020-04-24 DIAGNOSIS — R002 Palpitations: Secondary | ICD-10-CM

## 2020-04-24 MED ORDER — AMIODARONE 200 MG TAB
200 mg | ORAL_TABLET | Freq: Every day | ORAL | 3 refills | Status: DC
Start: 2020-04-24 — End: 2020-09-11

## 2020-04-24 NOTE — Progress Notes (Signed)
 Patient here for a 2 week fu EKG after starting amiodarone .  C/o dizziness.    Leita Boers, NP reviewed EKG and advised patient to decrease amiodarone  to 200 mg daily.    Patient scheduled for a TEE with Dr. Ngo 05/16/20.  Pre procedure instructions reviewed with patient and time allowed for questions.    Progress Notes  Jiles Cough, MD (Physician) . [facility]     Echo is severely limited  LVH present 1.2-1.4 cm  LVEF normal  Valves not well visualized  Doppler gradient obtained and showed moderate gradient    Will see if pacer can be done with MRI or she needs TEE to examine AVR stenosis

## 2020-04-29 ENCOUNTER — Emergency Department: Admit: 2020-04-29 | Payer: MEDICARE | Primary: Family

## 2020-04-29 ENCOUNTER — Inpatient Hospital Stay
Admit: 2020-04-29 | Discharge: 2020-04-30 | Disposition: A | Discharge status: Home / Self Care | Payer: MEDICARE | Attending: Emergency Medicine

## 2020-04-29 DIAGNOSIS — R202 Paresthesia of skin: Secondary | ICD-10-CM

## 2020-04-29 LAB — GLUCOSE, POC: Glucose (POC): 166 mg/dL — ABNORMAL HIGH (ref 65–117)

## 2020-04-29 LAB — POCT GLUCOSE: POC Glucose: 166 mg/dL — ABNORMAL HIGH (ref 65–117)

## 2020-04-29 MED ORDER — SODIUM CHLORIDE 0.9% BOLUS IV
0.9 % | INTRAVENOUS | Status: AC
Start: 2020-04-29 — End: 2020-04-29
  Administered 2020-04-30: via INTRAVENOUS

## 2020-04-29 MED ORDER — IOPAMIDOL 76 % IV SOLN
37076 mg iodine /mL (76 %) | Freq: Once | INTRAVENOUS | Status: AC
Start: 2020-04-29 — End: 2020-04-29
  Administered 2020-04-29: via INTRAVENOUS

## 2020-04-29 MED ORDER — IOPAMIDOL 76 % IV SOLN
76 % | Freq: Once | INTRAVENOUS | Status: AC
Start: 2020-04-29 — End: 2020-04-29
  Administered 2020-04-29: via INTRAVENOUS

## 2020-04-29 MED FILL — ISOVUE-370  76 % INTRAVENOUS SOLUTION: 370 mg iodine /mL (76 %) | INTRAVENOUS | Qty: 50

## 2020-04-29 MED FILL — ISOVUE-370  76 % INTRAVENOUS SOLUTION: 370 mg iodine /mL (76 %) | INTRAVENOUS | Qty: 100

## 2020-04-29 MED FILL — SODIUM CHLORIDE 0.9 % IV: INTRAVENOUS | Qty: 1000

## 2020-04-29 NOTE — Progress Notes (Signed)
[facility] Dosing Services:      Automatic dosing adjustment of Lovenox for weight 150 kg, CrCl>50     Wt Readings from Last 1 Encounters:   04/29/20 149.7 kg (330 lb)       Ht Readings from Last 1 Encounters:   04/29/20 170.2 cm (67")           Previous Dose 40 mg sc daily   Creatinine Clearance Estimated Creatinine Clearance: 64.2 mL/min (A) (based on SCr of 1.37 mg/dL (H)).    Creatinine Lab Results   Component Value Date/Time    Creatinine 1.37 (H) 04/29/2020 07:29 PM       Platelet Lab Results   Component Value Date/Time    PLATELET 98 (L) 04/29/2020 07:29 PM      H/H Lab Results   Component Value Date/Time    HGB 14.1 04/29/2020 07:29 PM           Pharmacist made change to enoxaparin therapy based on:     [ x] BMI: dose changed to: 30 mg sc bid    - Pharmacy to automatically make dose adjustment for renal dysfunction (creatinine clearance less than 30 mL/min)  - Pharmacy to automatically make dose adjustment for obesity (BMI greater than 40)  - Pharmacy to make dose rounding adjustments per [facility] dose adjustment scale.    Pharmacy to monitor patient's progress.  Will make dose adjustment as needed per changing renal function.  Will communicate further recommendations regarding patient's anticoagulation therapy with prescriber.    Signed Lynnae Prude . Contact information: [phone]

## 2020-04-29 NOTE — ED Notes (Signed)
Pt provided meal/drink.

## 2020-04-29 NOTE — ED Notes (Signed)
Pt to CT as code neuro with RN

## 2020-04-29 NOTE — ED Notes (Signed)
 Introduced self to patient as Engineer, building services.  Pt is alert & oriented x4, placed on bedside monitor x3. Pts family at bedside. Call bell within reach.     CC: chest pain x 1500, blurry vision and HA with neck pain x 1600, difficulty swallowing x3 min after arrival to ED.   Onset: 1500  PMH: pacemaker, aortic valve replacement, open heart surgery    Pt states previous hx of swallowing difficulty during a cardiac event

## 2020-04-29 NOTE — H&P (Signed)
[facility]  [address] [place] [place], [place] [address]  [phone]    Admission History and Physical      NAME:  Deborah [name]   DOB:   [birth year]   MRN:  [id number]     PCP:  Deborah Jansky, NP     Date/Time of service:  04/29/2020  9:25 PM        Subjective:     CHIEF COMPLAINT: L arm numbness, tingling    HISTORY OF PRESENT ILLNESS:     Deborah [name] is a 65 y.o.  female who is admitted with L arm numbness, tingling that started this afternoon. Developed migraine symptoms and jitteriness, checked her sugar and noted to be 60. She had squeezing chest pain after those symptoms resolved associated with tingly feeling in L arm. Felt like she was going in "slow motion" yesterday, reports that yesterday her son commented that her L face was drooping and her L pupil was pinpoint.    Allergies   Allergen Reactions   ??? Nitroglycerin Unknown (comments)     hypotension   ??? Hydrochlorothiazide Other (comments)     Reports 'kidneys dry up"        Prior to Admission medications    Medication Sig Start Date End Date Taking? Authorizing Provider   amiodarone (CORDARONE) 200 mg tablet Take 1 Tablet by mouth daily. 04/24/20   Arturo Morton, NP   acetaminophen (Tylenol Extra Strength) 500 mg tablet Take 1,000 mg by mouth every six (6) hours as needed for Pain.    Provider, Historical   albuterol (PROVENTIL HFA, VENTOLIN HFA, PROAIR HFA) 90 mcg/actuation inhaler Take 2 Puffs by inhalation every six (6) hours as needed.    Provider, Historical   atorvastatin (LIPITOR) 80 mg tablet  01/04/20   Provider, Historical   calcium-cholecalciferol, D3, (CALTRATE 600+D) tablet Take 1 Tablet by mouth nightly.    Provider, Historical   cholecalciferol (Vitamin D3) (1000 Units /25 mcg) tablet Take 1,000 Units by mouth daily.    Provider, Historical   citalopram (CELEXA) 40 mg tablet Take 40 mg by mouth daily. 02/22/20   Provider, Historical   fluticasone propionate (FLONASE) 50 mcg/actuation nasal spray 2 Sprays  by Nasal route daily as needed.    Provider, Historical   gabapentin (NEURONTIN) 600 mg tablet TAKE 1 TABLET BY MOUTH 4 TIMES A DAY 02/22/20   Provider, Historical   levothyroxine (SYNTHROID) 137 mcg tablet TAKE 1 BY MOUTH DAILY, FOLLOW UP IN 90 DAYS FOR RECHECK THYROID LEVEL 02/24/20   Provider, Historical   metFORMIN (GLUCOPHAGE) 500 mg tablet Take 750 mg by mouth two (2) times daily (with meals). 01/13/20   Provider, Historical   sucralfate (CARAFATE) 1 gram tablet Take 1 g by mouth three (3) times daily.    Provider, Historical   torsemide (DEMADEX) 20 mg tablet TAKE 1 BY MOUTH DAILY F/U WITH CARDIOLOGY 02/22/20   Provider, Historical   traZODone (DESYREL) 100 mg tablet Take 100 mg by mouth nightly. 12/31/19   Provider, Historical   EPINEPHrine (EPIPEN) 0.3 mg/0.3 mL injection  01/11/20   Provider, Historical   Xtampza ER 9 mg capsule TAKE 1 CAPSULE BY MOUTH EVERY 12 HOURS FILL 02/12/20 02/24/20   Provider, Historical   cholecalciferol (VITAMIN D3) (5000 Units /125 mcg) capsule Take 5,000 mg by mouth daily.    Provider, Historical   glimepiride (AMARYL) 2 mg tablet Take 2 mg by mouth two (2) times a day.  Provider, Historical   montelukast (SINGULAIR) 10 mg tablet Take 10 mg by mouth nightly.    Provider, Historical       Past Medical History:   Diagnosis Date   ??? Asthma    ??? Pacemaker    ??? Thyroid disease    ??? Valvular heart disease     Aortic valve replacement in 2010        No past surgical history on file.    Social History     Tobacco Use   ??? Smoking status: Former Smoker     Packs/day: 1.00     Years: 20.00     Pack years: 20.00     Quit date: 03/21/2010     Years since quitting: 10.1   ??? Smokeless tobacco: Never Used   Substance Use Topics   ??? Alcohol use: Not Currently        Family History: father - diabetes, mother - vulvar cancer     Review of Systems:  (bold if positive, if negative)    Gen:  Eyes:  ENT:  ; difficulty swallowingCVS:  chest pain,Pulm:  GI:  GU:  MS:  Skin:  Psych:  Endo:  Hem:  Renal:   Neuro:  Numbness, tingling,        Objective:      VITALS:    Vital signs reviewed; most recent are:    Visit Vitals  BP 132/67 (BP 1 Location: Right arm)   Pulse 75   Temp 98.5 ??F (36.9 ??C)   Resp 18   Ht 5\' 7"  (1.702 m)   Wt 149.7 kg (330 lb)   SpO2 94%   BMI 51.69 kg/m??     SpO2 Readings from Last 6 Encounters:   04/29/20 94%   03/21/20 94%   02/28/20 96%        No intake or output data in the 24 hours ending 04/29/20 2125     Exam:     Physical Exam:    Gen:  Well-developed, well-nourished, in no acute distress  HEENT:  Pink conjunctivae, PERRL, hearing intact to voice, moist mucous membranes  Neck:  Supple, without masses, thyroid non-tender  Resp:  No accessory muscle use, clear breath sounds without wheezes rales or rhonchi  Card:  No murmurs, normal S1, S2 without thrills, bruits or peripheral edema  Abd:  Soft, non-tender, non-distended, normoactive bowel sounds are present, no palpable organomegaly and no detectable hernias  Lymph:  No cervical adenopathy  Musc:  No cyanosis or clubbing  Skin:  No rashes or ulcers, skin turgor is good  Neuro:  Nonfocal, moves extremities, follows commands  Psych:  Alert with good insight.  Oriented to person, place, and time    Labs:    Recent Labs     04/29/20  1929   WBC 7.5   HGB 14.1   HCT 42.0   PLT 98*     Recent Labs     04/29/20  1929   NA 141   K 4.3   CL 101   CO2 33*   GLU 162*   BUN 18   CREA 1.37*   CA 9.5   MG 1.5*   ALB 3.7   TBILI 1.6*   ALT 23     Lab Results   Component Value Date/Time    Glucose (POC) 166 (H) 04/29/2020 07:33 PM        Assessment/Plan:       1. LUE paresthesia, L facial droop -  poss TIA vs symptomatic hypoglycemia - hold metformin; ssi, monitor; check a1c, may not need metformin on dc; neuro c/s, MRI, echo with bubble  2. Chest pain - trend troponin  3. UTI - ucx, rocephin  4. Palpitations - follows with cardiology, on amio; no mention of afib in chart  5. History of DM2 - check a1c  6. COPD without acute exacerbation  7.  Hypothyroidism  8. Morbid obesity - BMI 51.69  9. CKD  10. OSA  11. History of AV block - s/p pacer  12. Migraines - prn tylenol  13. VTE prophylaxis - lovenox  14. Dispo - admit to telemetry, neuro unit; PT/OT eval for dc planning    Full code status order placed in epic.    Attending Physician: Thermon Leyland, DO

## 2020-04-29 NOTE — ED Notes (Signed)
Pt on with Dr. Henri Medal TeleNeuro

## 2020-04-29 NOTE — ED Notes (Signed)
Pt arrived with complaints of chest pain, numbness in left arm states coulnt lift arm. Also complains of neck pain   She states she is diabetic, blood sugar around 4pm was 60.   Pt has a pacemaker

## 2020-04-29 NOTE — ED Provider Notes (Signed)
Please note that this dictation was completed with Dragon, the computer voice recognition software. ??Quite often unanticipated grammatical, syntax, homophones, and other interpretive errors are inadvertently transcribed by the computer software. ??Please disregard these errors. ??Please excuse any errors that have escaped final proofreading.    65 year old female past medical history markable for asthma, thyroid disease, aortic valve replacement 2010, and a pacemaker presents the ER POV complaining of " yesterday 5 PM her son came home and he said I was "out of it" been having episodes where her blood sugar drops every day no matter what I eat.  Today around 2 PM I ate a breakfast burrito with hashbrowns eggs stuff like that and.  2 hours later check my blood sugar and it was 60.  I noticed that was not feeling well I thought it was getting a migraine.  Then I noticed my left arm seem to go numb and tingly, but my leg was doing okay.  And my son told me that yesterday when he came home that I had a small left-sided facial droop and my left pupil was pinpoint but my other pupil was "wide open".  We did not take out anything of it yesterday I did not get seen anywhere.  But then when this happened again today we thought maybe I better get seen."  We relate that the patient resides in [place] normally has a pacemaker is not on a blood thinner recently moved to this area but has not established with any doctors here yet.  He denies any recent illnesses any trauma other current systemic complaints.  Patient overtly denies burning with urination vaginal discharge diarrhea.  Patient states after having a Coke in the breakfast [place] today when she checked her sugar and it was 60 little while later she then drank another Coke and had a cupcake to try and raise her blood sugar.  She states she is only medicine she is on for her diabetes is Metformin.  Patient then adds that the episode just prior to the onset of the left  arm numbness and tingling she did well and began to develop some chest pain chest pressure.,  Diffuse pattern located more about the left side of the chest with a tightness nonradiating.  No other associated symptoms.    pt denies HA, vison changes, diff swallowing, SOB, Abd pain, F/Ch, N/V, D/Cons or other current systemic complaints    Social/ PSH reviewed in EMR    EMR Chart Reviewed             Past Medical History:   Diagnosis Date   ??? Asthma    ??? Pacemaker    ??? Thyroid disease    ??? Valvular heart disease     Aortic valve replacement in 2010       No past surgical history on file.      No family history on file.    Social History     Socioeconomic History   ??? Marital status: SINGLE     Spouse name: Not on file   ??? Number of children: Not on file   ??? Years of education: Not on file   ??? Highest education level: Not on file   Occupational History   ??? Not on file   Tobacco Use   ??? Smoking status: Former Smoker     Packs/day: 1.00     Years: 20.00     Pack years: 20.00     Quit date: 03/21/2010  Years since quitting: 10.1   ??? Smokeless tobacco: Never Used   Substance and Sexual Activity   ??? Alcohol use: Not Currently   ??? Drug use: Not on file   ??? Sexual activity: Not on file   Other Topics Concern   ??? Not on file   Social History Narrative   ??? Not on file     Social Determinants of Health     Financial Resource Strain:    ??? Difficulty of Paying Living Expenses: Not on file   Food Insecurity:    ??? Worried About Running Out of Food in the Last Year: Not on file   ??? Ran Out of Food in the Last Year: Not on file   Transportation Needs:    ??? Lack of Transportation (Medical): Not on file   ??? Lack of Transportation (Non-Medical): Not on file   Physical Activity:    ??? Days of Exercise per Week: Not on file   ??? Minutes of Exercise per Session: Not on file   Stress:    ??? Feeling of Stress : Not on file   Social Connections:    ??? Frequency of Communication with Friends and Family: Not on file   ??? Frequency of Social  Gatherings with Friends and Family: Not on file   ??? Attends Religious Services: Not on file   ??? Active Member of Clubs or Organizations: Not on file   ??? Attends Banker Meetings: Not on file   ??? Marital Status: Not on file   Intimate Partner Violence:    ??? Fear of Current or Ex-Partner: Not on file   ??? Emotionally Abused: Not on file   ??? Physically Abused: Not on file   ??? Sexually Abused: Not on file   Housing Stability:    ??? Unable to Pay for Housing in the Last Year: Not on file   ??? Number of Places Lived in the Last Year: Not on file   ??? Unstable Housing in the Last Year: Not on file         ALLERGIES: Hydrochlorothiazide    Review of Systems   Constitutional: Negative for appetite change, chills and fever.   HENT: Negative for drooling, rhinorrhea, trouble swallowing and voice change.    Eyes: Negative for pain and visual disturbance.   Respiratory: Negative for chest tightness and shortness of breath.    Cardiovascular: Negative for chest pain, palpitations and leg swelling.   Gastrointestinal: Negative for abdominal pain, diarrhea, nausea and vomiting.   Genitourinary: Negative for dysuria.   Musculoskeletal: Negative for back pain.   Skin: Negative for rash.   Neurological: Positive for facial asymmetry, numbness and headaches. Negative for speech difficulty and weakness.   Psychiatric/Behavioral: Negative for confusion.   All other systems reviewed and are negative.      Vitals:    04/30/20 0713 04/30/20 0726 04/30/20 0910 04/30/20 1033   BP:  136/79 115/75 96/69   Pulse: 67 66  66   Resp:  22  19   Temp:  97.7 ??F (36.5 ??C)  98.7 ??F (37.1 ??C)   SpO2:  96%  97%   Weight:   148.8 kg (328 lb)    Height:   5\' 7"  (1.702 m)             Physical Exam  Vitals and nursing note reviewed.   Constitutional:       General: She is not in acute distress.     Appearance:  Normal appearance. She is well-developed. She is obese. She is not ill-appearing, toxic-appearing or diaphoretic.      Comments: NAD, AxOx4,  speaking in complete sentences    gcs = 15   HENT:      Head: Normocephalic and atraumatic.      Right Ear: External ear normal.      Left Ear: External ear normal.   Eyes:      General: No scleral icterus.        Right eye: No discharge.         Left eye: No discharge.      Extraocular Movements: Extraocular movements intact.      Conjunctiva/sclera: Conjunctivae normal.      Pupils: Pupils are equal, round, and reactive to light.   Neck:      Vascular: No JVD.      Trachea: No tracheal deviation.   Cardiovascular:      Rate and Rhythm: Normal rate and regular rhythm.      Pulses: Normal pulses.      Heart sounds: Normal heart sounds. No murmur heard.  No friction rub. No gallop.    Pulmonary:      Effort: Pulmonary effort is normal. No respiratory distress.      Breath sounds: Normal breath sounds. No stridor. No wheezing, rhonchi or rales.   Chest:      Chest wall: No tenderness.   Abdominal:      General: Bowel sounds are normal.      Palpations: Abdomen is soft.      Tenderness: There is no abdominal tenderness. There is no guarding or rebound.      Hernia: No hernia is present.      Comments: nttp       Genitourinary:     Comments: Pt denies urinary/ vaginal complaints  Musculoskeletal:         General: No tenderness or deformity. Normal range of motion.      Cervical back: Normal range of motion and neck supple.      Right lower leg: No edema.      Left lower leg: No edema.   Skin:     General: Skin is warm and dry.      Capillary Refill: Capillary refill takes less than 2 seconds.      Coloration: Skin is not jaundiced or pale.      Findings: No bruising, erythema, lesion or rash.   Neurological:      General: No focal deficit present.      Mental Status: She is alert and oriented to person, place, and time.      Cranial Nerves: No cranial nerve deficit.      Sensory: No sensory deficit.      Motor: No weakness or abnormal muscle tone.      Coordination: Coordination normal.      Gait: Gait normal.      Deep  Tendon Reflexes: Reflexes normal.      Comments: pt has motor/ CV/ Sensation grossly intact to all extremities, R = L in strength;   Psychiatric:         Behavior: Behavior normal.         Thought Content: Thought content normal.          MDM       Procedures    No chief complaint on file.      7:03 PM  The patients presenting problems have been discussed, and they are in  agreement with the care plan formulated and outlined with them.  I have encouraged them to ask questions as they arise throughout their visit.    MEDICATIONS GIVEN:  Medications - No data to display    LABS REVIEWED:  Labs Reviewed - No data to display    RADIOLOGY RESULTS:  The following have been ordered and reviewed:  _____________________________________________________________________  _____________________________________________________________________    EKG interpretation:   Rhythm: normal sinus rhythm; and ir-regular due to PVC's . Rate (approx.): 78; Axis: normal; P wave: normal; QRS interval: normal ; ST/T wave: normal; Negative acute significant segmental elevations/ compared to study dated 038/28/2022      PROCEDURES:        CONSULTATIONS:       PROGRESS NOTES:      DIAGNOSIS:    1. TIA (transient ischemic attack)    2. Cervical spondylosis        PLAN:  1-      ED COURSE: The patients hospital course has been uncomplicated.    7:33 PM  Patient now stating he is having difficulty swallowing.  This just started "3 minutes ago" she is not drooling controlling her secretion.  Due to fluctuating of symptoms will call code stroke level 1       CONSULT  NOTE  7:49 PM  Deborah [name], Carlisle BeersJohn L, MD spoke with Dr Henri MedalKiely.  Specialty: Teleneurology  Discussed pt's hx, disposition, and available diagnostic and imaging results. Reviewed care plans. Consulting physician agrees with plans as outlined 'I will see her'; .      8:26 PM  Dr. Adolm JosephKiley seen the patient.  Recommends give the patient full-strength aspirin obtain an EEG MRI and admit for neuro evaluation  as inpatient.  Patient  was told of  plans and agrees.     Perfect Serve Consult for Admission  9:16 PM    ED Room Number: ER11/11  Patient Name and age:  Deborah [name] 65 y.o.  female  Working Diagnosis: No diagnosis found.    COVID-19 Suspicion:  no  Sepsis present:  no  Reassessment needed: yes  Code Status:  Full Code  Readmission: no  Isolation Requirements:  no  Recommended Level of Care:  telemetry  Department:[facility] - [phone]  Other:  Code S level 1/ no TPA; sx have resolved; started yesterday evening/ then returned this afternoon;

## 2020-04-30 ENCOUNTER — Observation Stay: Admit: 2020-04-30 | Payer: MEDICARE | Primary: Family

## 2020-04-30 LAB — ECHO ADULT COMPLETE
AV Area by Peak Velocity: 1 cm2
AV Area by VTI: 1 cm2
AV Mean Gradient: 25 mmHg
AV Mean Velocity: 2.4 m/s
AV Peak Gradient: 46 mmHg
AV Peak Velocity: 3.4 m/s
AV VTI: 76.1 cm
AV Velocity Ratio: 0.24
AVA/BSA Peak Velocity: 0.4 cm2/m2
AVA/BSA VTI: 0.4 cm2/m2
E/E' Lateral: 8.57
E/E' Ratio (Averaged): 8.57
E/E' Septal: 8.57
Fractional Shortening 2D: 35 % (ref 28–44)
IVSd: 1.2 cm — AB (ref 0.6–0.9)
LA Diameter: 3.8 cm
LA Size Index: 1.52 cm/m2
LV E' Lateral Velocity: 7 cm/s
LV E' Septal Velocity: 7 cm/s
LV Mass 2D Index: 90.6 g/m2 (ref 43–95)
LV Mass 2D: 226.4 g — AB (ref 67–162)
LV RWT Ratio: 0.49
LVIDd Index: 1.96 cm/m2
LVIDd: 4.9 cm (ref 3.9–5.3)
LVIDs Index: 1.28 cm/m2
LVIDs: 3.2 cm
LVOT Area: 4.2 cm2
LVOT Diameter: 2.3 cm
LVOT Mean Gradient: 1 mmHg
LVOT Peak Gradient: 3 mmHg
LVOT Peak Velocity: 0.8 m/s
LVOT SV: 78.1 ml
LVOT Stroke Volume Index: 31.2 mL/m2
LVOT VTI: 18.8 cm
LVOT:AV VTI Index: 0.25
LVPWd: 1.2 cm — AB (ref 0.6–0.9)
MV A Velocity: 0.76 m/s
MV E Velocity: 0.6 m/s
MV E Wave Deceleration Time: 283.6 ms
MV E/A: 0.79
PV Max Velocity: 1 m/s
PV Peak Gradient: 4 mmHg
RVOT Peak Gradient: 2 mmHg
RVOT Peak Velocity: 0.7 m/s

## 2020-04-30 LAB — LIPASE
Lipase: 329 U/L (ref 73–393)
Lipase: 329 U/L (ref 73–393)

## 2020-04-30 LAB — METABOLIC PANEL, COMPREHENSIVE
A-G Ratio: 1 — ABNORMAL LOW (ref 1.1–2.2)
ALT (SGPT): 23 U/L (ref 12–78)
AST (SGOT): 18 U/L (ref 15–37)
Albumin: 3.7 g/dL (ref 3.5–5.0)
Alk. phosphatase: 67 U/L (ref 45–117)
Anion gap: 7 mmol/L (ref 5–15)
BUN/Creatinine ratio: 13 (ref 12–20)
BUN: 18 MG/DL (ref 6–20)
Bilirubin, total: 1.6 MG/DL — ABNORMAL HIGH (ref 0.2–1.0)
CO2: 33 mmol/L — ABNORMAL HIGH (ref 21–32)
Calcium: 9.5 MG/DL (ref 8.5–10.1)
Chloride: 101 mmol/L (ref 97–108)
Creatinine: 1.37 MG/DL — ABNORMAL HIGH (ref 0.55–1.02)
GFR est AA: 47 mL/min/{1.73_m2} — ABNORMAL LOW (ref >60)
GFR est non-AA: 39 mL/min/{1.73_m2} — ABNORMAL LOW (ref >60)
Globulin: 3.8 g/dL (ref 2.0–4.0)
Glucose: 162 mg/dL — ABNORMAL HIGH (ref 65–100)
Potassium: 4.3 mmol/L (ref 3.5–5.1)
Protein, total: 7.5 g/dL (ref 6.4–8.2)
Sodium: 141 mmol/L (ref 136–145)

## 2020-04-30 LAB — URINALYSIS W/MICROSCOPIC
Bacteria: NEGATIVE /hpf
Bilirubin: NEGATIVE
Blood: NEGATIVE
Glucose: NEGATIVE mg/dL
Ketone: NEGATIVE mg/dL
Nitrites: NEGATIVE
Protein: NEGATIVE mg/dL
Specific gravity: 1.022 (ref 1.003–1.030)
Urobilinogen: 0.2 EU/dL (ref 0.2–1.0)
pH (UA): 5 (ref 5.0–8.0)

## 2020-04-30 LAB — CBC WITH AUTOMATED DIFF
ABS. BASOPHILS: 0.1 10*3/uL (ref 0.0–0.1)
ABS. EOSINOPHILS: 0.2 10*3/uL (ref 0.0–0.4)
ABS. IMM. GRANS.: 0 10*3/uL (ref 0.00–0.04)
ABS. LYMPHOCYTES: 2.8 10*3/uL (ref 0.8–3.5)
ABS. MONOCYTES: 0.5 10*3/uL (ref 0.0–1.0)
ABS. NEUTROPHILS: 3.9 10*3/uL (ref 1.8–8.0)
ABSOLUTE NRBC: 0 10*3/uL (ref 0.00–0.01)
BASOPHILS: 1 % (ref 0–1)
EOSINOPHILS: 3 % (ref 0–7)
HCT: 42 % (ref 35.0–47.0)
HGB: 14.1 g/dL (ref 11.5–16.0)
IMMATURE GRANULOCYTES: 0 % (ref 0.0–0.5)
LYMPHOCYTES: 37 % (ref 12–49)
MCH: 31.5 PG (ref 26.0–34.0)
MCHC: 33.6 g/dL (ref 30.0–36.5)
MCV: 93.8 FL (ref 80.0–99.0)
MONOCYTES: 7 % (ref 5–13)
MPV: 10.6 FL (ref 8.9–12.9)
NEUTROPHILS: 52 % (ref 32–75)
NRBC: 0 PER 100 WBC
PLATELET: 98 10*3/uL — ABNORMAL LOW (ref 150–400)
RBC: 4.48 M/uL (ref 3.80–5.20)
RDW: 13.7 % (ref 11.5–14.5)
WBC COMMENTS: REACTIVE
WBC: 7.5 10*3/uL (ref 3.6–11.0)

## 2020-04-30 LAB — PROTHROMBIN TIME + INR
INR: 1.1 (ref 0.9–1.1)
Prothrombin time: 11.6 s — ABNORMAL HIGH (ref 9.0–11.1)

## 2020-04-30 LAB — HEMOGLOBIN A1C WITH EAG
Est. average glucose: 128 mg/dL
Hemoglobin A1c: 6.1 % — ABNORMAL HIGH (ref 4.0–5.6)

## 2020-04-30 LAB — LIPID PANEL
CHOL/HDL Ratio: 1.4 (ref 0.0–5.0)
Chol/HDL Ratio: 1.4 (ref 0.0–5.0)
Cholesterol, Total: 106 MG/DL (ref <200)
Cholesterol, total: 106 MG/DL (ref <200)
HDL Cholesterol: 76 MG/DL
HDL: 76 MG/DL
LDL Calculated: 12.6 MG/DL (ref 0–100)
LDL, calculated: 12.6 MG/DL (ref 0–100)
Triglyceride: 87 MG/DL (ref <150)
Triglycerides: 87 MG/DL (ref <150)
VLDL Cholesterol Calculated: 17.4 MG/DL
VLDL, calculated: 17.4 MG/DL

## 2020-04-30 LAB — GLUCOSE, POC
Glucose (POC): 93 mg/dL (ref 65–117)
Glucose (POC): 130 mg/dL — ABNORMAL HIGH (ref 65–117)

## 2020-04-30 LAB — SAMPLES BEING HELD

## 2020-04-30 LAB — MAGNESIUM
Magnesium: 1.5 mg/dL — ABNORMAL LOW (ref 1.6–2.4)
Magnesium: 1.5 mg/dL — ABNORMAL LOW (ref 1.6–2.4)

## 2020-04-30 LAB — NT-PRO BNP: NT pro-BNP: 153 PG/ML — ABNORMAL HIGH (ref <125)

## 2020-04-30 LAB — TROPONIN-HIGH SENSITIVITY
Troponin-High Sensitivity: 11 ng/L (ref 0–51)
Troponin-High Sensitivity: 9 ng/L (ref 0–51)

## 2020-04-30 LAB — TRANSTHORACIC ECHOCARDIOGRAM (TTE) COMPLETE (CONTRAST/BUBBLE/3D PRN)
AV Area by Peak Velocity: 1 cm2
AV Area by VTI: 1 cm2
AV Mean Gradient: 25 mmHg
AV Mean Velocity: 2.4 m/s
AV Peak Gradient: 46 mmHg
AV Peak Velocity: 3.4 m/s
AV VTI: 76.1 cm
AV Velocity Ratio: 0.24
AVA/BSA Peak Velocity: 0.4 cm2/m2
AVA/BSA VTI: 0.4 cm2/m2
E/E' Lateral: 8.57
E/E' Ratio (Averaged): 8.57
E/E' Septal: 8.57
Fractional Shortening 2D: 35 % (ref 28–44)
IVSd: 1.2 cm — AB (ref 0.6–0.9)
LA Diameter: 3.8 cm
LA Size Index: 1.52 cm/m2
LV E' Lateral Velocity: 7 cm/s
LV E' Septal Velocity: 7 cm/s
LV Mass 2D Index: 90.6 g/m2 (ref 43–95)
LV Mass 2D: 226.4 g — AB (ref 67–162)
LV RWT Ratio: 0.49
LVIDd Index: 1.96 cm/m2
LVIDd: 4.9 cm (ref 3.9–5.3)
LVIDs Index: 1.28 cm/m2
LVIDs: 3.2 cm
LVOT Area: 4.2 cm2
LVOT Diameter: 2.3 cm
LVOT Mean Gradient: 1 mmHg
LVOT Peak Gradient: 3 mmHg
LVOT Peak Velocity: 0.8 m/s
LVOT SV: 78.1 ml
LVOT Stroke Volume Index: 31.2 mL/m2
LVOT VTI: 18.8 cm
LVOT:AV VTI Index: 0.25
LVPWd: 1.2 cm — AB (ref 0.6–0.9)
Left Ventricular Ejection Fraction: 63
MV A Velocity: 0.76 m/s
MV E Velocity: 0.6 m/s
MV E Wave Deceleration Time: 283.6 ms
MV E/A: 0.79
PV Max Velocity: 1 m/s
PV Peak Gradient: 4 mmHg
RVOT Peak Gradient: 2 mmHg
RVOT Peak Velocity: 0.7 m/s

## 2020-04-30 LAB — CBC WITH AUTO DIFFERENTIAL
Basophils %: 1 % (ref 0–1)
Basophils Absolute: 0.1 10*3/uL (ref 0.0–0.1)
Eosinophils %: 3 % (ref 0–7)
Eosinophils Absolute: 0.2 10*3/uL (ref 0.0–0.4)
Granulocyte Absolute Count: 0 10*3/uL (ref 0.00–0.04)
Hematocrit: 42 % (ref 35.0–47.0)
Hemoglobin: 14.1 g/dL (ref 11.5–16.0)
Immature Granulocytes: 0 % (ref 0.0–0.5)
Lymphocytes %: 37 % (ref 12–49)
Lymphocytes Absolute: 2.8 10*3/uL (ref 0.8–3.5)
MCH: 31.5 PG (ref 26.0–34.0)
MCHC: 33.6 g/dL (ref 30.0–36.5)
MCV: 93.8 FL (ref 80.0–99.0)
MPV: 10.6 FL (ref 8.9–12.9)
Monocytes %: 7 % (ref 5–13)
Monocytes Absolute: 0.5 10*3/uL (ref 0.0–1.0)
NRBC Absolute: 0 10*3/uL (ref 0.00–0.01)
Neutrophils %: 52 % (ref 32–75)
Neutrophils Absolute: 3.9 10*3/uL (ref 1.8–8.0)
Nucleated RBCs: 0 PER 100 WBC
Platelets: 98 10*3/uL — ABNORMAL LOW (ref 150–400)
RBC: 4.48 M/uL (ref 3.80–5.20)
RDW: 13.7 % (ref 11.5–14.5)
WBC Comment: REACTIVE
WBC: 7.5 10*3/uL (ref 3.6–11.0)

## 2020-04-30 LAB — TROPONIN, HIGH SENSITIVITY
Troponin, High Sensitivity: 11 ng/L (ref 0–51)
Troponin, High Sensitivity: 9 ng/L (ref 0–51)

## 2020-04-30 LAB — POCT GLUCOSE
POC Glucose: 93 mg/dL (ref 65–117)
POC Glucose: 130 mg/dL — ABNORMAL HIGH (ref 65–117)

## 2020-04-30 LAB — PROBNP, N-TERMINAL: BNP: 153 PG/ML — ABNORMAL HIGH (ref <125)

## 2020-04-30 LAB — COMPREHENSIVE METABOLIC PANEL
ALT: 23 U/L (ref 12–78)
AST: 18 U/L (ref 15–37)
Albumin/Globulin Ratio: 1 — ABNORMAL LOW (ref 1.1–2.2)
Albumin: 3.7 g/dL (ref 3.5–5.0)
Alkaline Phosphatase: 67 U/L (ref 45–117)
Anion Gap: 7 mmol/L (ref 5–15)
BUN: 18 MG/DL (ref 6–20)
Bun/Cre Ratio: 13 (ref 12–20)
CO2: 33 mmol/L — ABNORMAL HIGH (ref 21–32)
Calcium: 9.5 MG/DL (ref 8.5–10.1)
Chloride: 101 mmol/L (ref 97–108)
Creatinine: 1.37 MG/DL — ABNORMAL HIGH (ref 0.55–1.02)
EGFR IF NonAfrican American: 39 mL/min/{1.73_m2} — ABNORMAL LOW (ref >60)
GFR African American: 47 mL/min/{1.73_m2} — ABNORMAL LOW (ref >60)
Globulin: 3.8 g/dL (ref 2.0–4.0)
Glucose: 162 mg/dL — ABNORMAL HIGH (ref 65–100)
Potassium: 4.3 mmol/L (ref 3.5–5.1)
Sodium: 141 mmol/L (ref 136–145)
Total Bilirubin: 1.6 MG/DL — ABNORMAL HIGH (ref 0.2–1.0)
Total Protein: 7.5 g/dL (ref 6.4–8.2)

## 2020-04-30 LAB — URINALYSIS WITH MICROSCOPIC
BACTERIA, URINE: NEGATIVE /hpf
Bilirubin, Urine: NEGATIVE
Blood, Urine: NEGATIVE
Glucose, Ur: NEGATIVE mg/dL
Ketones, Urine: NEGATIVE mg/dL
Nitrite, Urine: NEGATIVE
Protein, UA: NEGATIVE mg/dL
Specific Gravity, UA: 1.022 (ref 1.003–1.030)
Urobilinogen, UA, POCT: 0.2 EU/dL (ref 0.2–1.0)
pH, UA: 5 (ref 5.0–8.0)

## 2020-04-30 LAB — PROTIME-INR
INR: 1.1 (ref 0.9–1.1)
Protime: 11.6 s — ABNORMAL HIGH (ref 9.0–11.1)

## 2020-04-30 LAB — HEMOGLOBIN A1C W/EAG
Hemoglobin A1C: 6.1 % — ABNORMAL HIGH (ref 4.0–5.6)
eAG: 128 mg/dL

## 2020-04-30 MED ORDER — AMIODARONE 200 MG TAB
200 mg | Freq: Every day | ORAL | Status: DC
Start: 2020-04-30 — End: 2020-04-30
  Administered 2020-04-30: 13:00:00 via ORAL

## 2020-04-30 MED ORDER — ENOXAPARIN 30 MG/0.3 ML SUB-Q SYRINGE
30 mg/0.3 mL | Freq: Two times a day (BID) | SUBCUTANEOUS | Status: DC
Start: 2020-04-30 — End: 2020-04-30
  Administered 2020-04-30 (×2): via SUBCUTANEOUS

## 2020-04-30 MED ORDER — LEVOTHYROXINE 112 MCG TAB
112 mcg | ORAL | Status: DC
Start: 2020-04-30 — End: 2020-04-30
  Administered 2020-04-30: 11:00:00 via ORAL

## 2020-04-30 MED ORDER — INSULIN LISPRO 100 UNIT/ML INJECTION
100 unit/mL | Freq: Four times a day (QID) | SUBCUTANEOUS | Status: DC
Start: 2020-04-30 — End: 2020-04-30

## 2020-04-30 MED ORDER — ASPIRIN 81 MG CHEWABLE TAB
81 mg | ORAL | Status: AC
Start: 2020-04-30 — End: 2020-04-29
  Administered 2020-04-30: 01:00:00 via ORAL

## 2020-04-30 MED ORDER — GLUCOSE 4 GRAM CHEWABLE TAB
4 gram | ORAL | Status: DC | PRN
Start: 2020-04-30 — End: 2020-04-30

## 2020-04-30 MED ORDER — SUCRALFATE 1 GRAM TAB
1 gram | Freq: Three times a day (TID) | ORAL | Status: DC
Start: 2020-04-30 — End: 2020-04-30
  Administered 2020-04-30 (×2): via ORAL

## 2020-04-30 MED ORDER — ALBUTEROL SULFATE 0.083 % (0.83 MG/ML) SOLN FOR INHALATION
2.5 mg /3 mL (0.083 %) | Freq: Four times a day (QID) | RESPIRATORY_TRACT | Status: DC | PRN
Start: 2020-04-30 — End: 2020-04-30

## 2020-04-30 MED ORDER — GABAPENTIN 300 MG CAP
300 mg | Freq: Three times a day (TID) | ORAL | Status: DC
Start: 2020-04-30 — End: 2020-04-30
  Administered 2020-04-30 (×2): via ORAL

## 2020-04-30 MED ORDER — SODIUM CHLORIDE 0.9 % IV
INTRAVENOUS | Status: DC
Start: 2020-04-30 — End: 2020-04-30
  Administered 2020-04-30: 05:00:00 via INTRAVENOUS

## 2020-04-30 MED ORDER — PERFLUTREN LIPID MICROSPHERES 1.1 MG/ML IV
1.1 mg/mL | Freq: Once | INTRAVENOUS | Status: AC
Start: 2020-04-30 — End: 2020-04-30
  Administered 2020-04-30: 14:00:00 via INTRAVENOUS

## 2020-04-30 MED ORDER — DEXTROSE 10% IN WATER (D10W) IV
10 % | INTRAVENOUS | Status: DC | PRN
Start: 2020-04-30 — End: 2020-04-30

## 2020-04-30 MED ORDER — ASPIRIN 325 MG TAB
325 mg | Freq: Every day | ORAL | Status: DC
Start: 2020-04-30 — End: 2020-04-30
  Administered 2020-04-30: 13:00:00 via ORAL

## 2020-04-30 MED ORDER — SODIUM CHLORIDE 0.9% BOLUS IV
0.9 % | INTRAVENOUS | Status: AC
Start: 2020-04-30 — End: 2020-04-30
  Administered 2020-04-30: 03:00:00 via INTRAVENOUS

## 2020-04-30 MED ORDER — ALBUTEROL SULFATE HFA 90 MCG/ACTUATION AEROSOL INHALER
90 mcg/actuation | Freq: Four times a day (QID) | RESPIRATORY_TRACT | Status: DC | PRN
Start: 2020-04-30 — End: 2020-04-29

## 2020-04-30 MED ORDER — SODIUM CHLORIDE 0.9 % INJECTION
1 gram | INTRAMUSCULAR | Status: DC
Start: 2020-04-30 — End: 2020-04-30
  Administered 2020-04-30: 03:00:00 via INTRAVENOUS

## 2020-04-30 MED ORDER — ACETAMINOPHEN (TYLENOL) SOLUTION 32MG/ML
ORAL | Status: DC | PRN
Start: 2020-04-30 — End: 2020-04-30

## 2020-04-30 MED ORDER — FLUTICASONE 50 MCG/ACTUATION NASAL SPRAY, SUSP
50 mcg/actuation | Freq: Every day | NASAL | Status: DC | PRN
Start: 2020-04-30 — End: 2020-04-30

## 2020-04-30 MED ORDER — ASPIRIN 81 MG CAPSULE
81 mg | ORAL_CAPSULE | Freq: Every day | ORAL | 0 refills | Status: AC
Start: 2020-04-30 — End: 2020-05-30

## 2020-04-30 MED ORDER — TRAZODONE 50 MG TAB
50 mg | Freq: Every evening | ORAL | Status: DC
Start: 2020-04-30 — End: 2020-04-30
  Administered 2020-04-30: 05:00:00 via ORAL

## 2020-04-30 MED ORDER — CHOLECALCIFEROL (VITAMIN D3) 5,000 UNIT CAP
ORAL_CAPSULE | Freq: Every day | ORAL | 0 refills | Status: AC
Start: 2020-04-30 — End: 2020-05-30

## 2020-04-30 MED ORDER — CITALOPRAM 20 MG TAB
20 mg | Freq: Every day | ORAL | Status: DC
Start: 2020-04-30 — End: 2020-04-30
  Administered 2020-04-30: 13:00:00 via ORAL

## 2020-04-30 MED ORDER — ATORVASTATIN 20 MG TAB
20 mg | Freq: Every evening | ORAL | Status: DC
Start: 2020-04-30 — End: 2020-04-30
  Administered 2020-04-30: 05:00:00 via ORAL

## 2020-04-30 MED ORDER — GLUCAGON 1 MG INJECTION
1 mg | INTRAMUSCULAR | Status: DC | PRN
Start: 2020-04-30 — End: 2020-04-30

## 2020-04-30 MED ORDER — ACETAMINOPHEN 325 MG TABLET
325 mg | ORAL | Status: DC | PRN
Start: 2020-04-30 — End: 2020-04-30

## 2020-04-30 MED ORDER — MONTELUKAST 10 MG TAB
10 mg | Freq: Every evening | ORAL | Status: DC
Start: 2020-04-30 — End: 2020-04-30
  Administered 2020-04-30: 05:00:00 via ORAL

## 2020-04-30 MED ORDER — ACETAMINOPHEN 650 MG RECTAL SUPPOSITORY
650 mg | RECTAL | Status: DC | PRN
Start: 2020-04-30 — End: 2020-04-30

## 2020-04-30 MED ORDER — TORSEMIDE 20 MG TAB
20 mg | Freq: Every day | ORAL | Status: DC
Start: 2020-04-30 — End: 2020-04-30
  Administered 2020-04-30: 13:00:00 via ORAL

## 2020-04-30 MED FILL — ASPIRIN 81 MG CHEWABLE TAB: 81 mg | ORAL | Qty: 4

## 2020-04-30 MED FILL — SUCRALFATE 1 GRAM TAB: 1 gram | ORAL | Qty: 1

## 2020-04-30 MED FILL — SODIUM CHLORIDE 0.9 % IV: INTRAVENOUS | Qty: 1000

## 2020-04-30 MED FILL — ENOXAPARIN 30 MG/0.3 ML SUB-Q SYRINGE: 30 mg/0.3 mL | SUBCUTANEOUS | Qty: 0.3

## 2020-04-30 MED FILL — FLUTICASONE 50 MCG/ACTUATION NASAL SPRAY, SUSP: 50 mcg/actuation | NASAL | Qty: 16

## 2020-04-30 MED FILL — AMIODARONE 200 MG TAB: 200 mg | ORAL | Qty: 1

## 2020-04-30 MED FILL — DEFINITY 1.1 MG/ML INTRAVENOUS SUSPENSION: 1.1 mg/mL | INTRAVENOUS | Qty: 2

## 2020-04-30 MED FILL — TORSEMIDE 20 MG TAB: 20 mg | ORAL | Qty: 1

## 2020-04-30 MED FILL — CEFTRIAXONE 1 GRAM SOLUTION FOR INJECTION: 1 gram | INTRAMUSCULAR | Qty: 1

## 2020-04-30 MED FILL — MONTELUKAST 10 MG TAB: 10 mg | ORAL | Qty: 1

## 2020-04-30 MED FILL — GABAPENTIN 300 MG CAP: 300 mg | ORAL | Qty: 2

## 2020-04-30 MED FILL — ATORVASTATIN 20 MG TAB: 20 mg | ORAL | Qty: 4

## 2020-04-30 MED FILL — CITALOPRAM 20 MG TAB: 20 mg | ORAL | Qty: 2

## 2020-04-30 MED FILL — SODIUM CHLORIDE 0.9 % IV: INTRAVENOUS | Qty: 250

## 2020-04-30 MED FILL — TRAZODONE 50 MG TAB: 50 mg | ORAL | Qty: 2

## 2020-04-30 MED FILL — ASPIRIN 325 MG TAB: 325 mg | ORAL | Qty: 1

## 2020-04-30 MED FILL — LEVOTHYROXINE 25 MCG TAB: 25 mcg | ORAL | Qty: 1

## 2020-04-30 NOTE — Progress Notes (Signed)
[facility]    Medical Progress Note      NAME: Deborah [name]   DOB:  1956-01-11  MRM:  [id number]    Date/Time of service 04/30/2020  12:51 PM          Assessment and Plan:     Arm paresthesia, left - POA, I do not think this is TIA or CVA.  I think this is more likely radiculopathy.  We cannot get neck MRI due to pacer  Nevertheless, TIA workup. Unremarkable tele. Neuro consulted. Stable neurochecks.  ECHO heart, but already has plan for outpatient TEE. Check Lipids, TSH and A1c. Continue statin, ASA and lovenox. Fall precautions and PT/OT eval.    Chronic pain / Chronic narcotic use / Neuropathy - POA, usually on Xtampa and gabapentin, managed by a pain specialist. May need referral to ortho spine.    Anxiety and depression - This may contribute to her sensation of symptoms.  Continue celexa and trazondone    DM type 2 causing renal disease - Diabetic diet and counseling.  SSI per protocol.  Holding home metformin and amaryl. Check A1c.    OSA on CPAP / Morbid obesity - Advise weight loss and continued compliance wih CPAP.    Thrombocytopenia - POA, mild, unclear etiology.  PCP to monitor.    CKD (chronic kidney disease), stage III - Appears stable. Not AKI.  Not IVVD    Aortic valve replaced / Pacemaker - Appears stable.  Continue amiodarone and torsemide, stop IVF    Hypothyroid - continue synthroid    Asthma / Rhinitis - I doubt true asthma.  Not on long acting meds.  Continue sinulair, flonase    Hyperlipidemia - Continue atorvastatin    GERD (gastroesophageal reflux disease) - Start PPI, continue sucralfate.  Likely explains chest pain and asthma symptoms       Subjective:     Chief Complaint:  Left arm back to normal    ROS:  (bold if positive, if negative)    Tolerating some PT  Tolerating Diet        Objective:     Last 24hrs VS reviewed since prior progress note. Most recent are:    Visit Vitals  BP 136/79 (BP 1 Location: Right lower arm, BP Patient Position: At rest)   Pulse 66   Temp 97.7  ??F (36.5 ??C)   Resp 22   Ht 5\' 7"  (1.702 m)   Wt 149 kg (328 lb 7.8 oz)   SpO2 96%   BMI 51.45 kg/m??     SpO2 Readings from Last 6 Encounters:   04/30/20 96%   03/21/20 94%   02/28/20 96%    O2 Flow Rate (L/min): 2 l/min   No intake or output data in the 24 hours ending 04/30/20 0751     Physical Exam:    Gen:  Morbid obese, in no acute distress  HEENT:  Pink conjunctivae, PERRL, hearing intact to voice, moist mucous membranes  Neck:  Supple, without masses, thyroid non-tender  Resp:  No accessory muscle use, distant breath sounds without wheezes rales or rhonchi  Card:  No murmurs, distant S1, S2 without thrills, bruits, 2+ peripheral edema  Abd:  Soft, non-tender, non-distended, distant bowel sounds are present, no mass  Lymph:  No cervical or inguinal adenopathy  Musc:  No cyanosis or clubbing  Skin:  No rashes or ulcers, skin turgor is good  Neuro:  Cranial nerves are grossly intact, general motor weakness, follows commands  Psych:  Poor insight, oriented to person, place and time, alert    Telemetry reviewed:   normal sinus rhythm  __________________________________________________________________  Medications Reviewed: (see below)  Medications:     Current Facility-Administered Medications   Medication Dose Route Frequency   ??? amiodarone (CORDARONE) tablet 200 mg  200 mg Oral DAILY   ??? atorvastatin (LIPITOR) tablet 80 mg  80 mg Oral QHS   ??? citalopram (CELEXA) tablet 40 mg  40 mg Oral DAILY   ??? gabapentin (NEURONTIN) capsule 600 mg  600 mg Oral TID   ??? levothyroxine (SYNTHROID) tablet 137 mcg  137 mcg Oral 6am   ??? montelukast (SINGULAIR) tablet 10 mg  10 mg Oral QHS   ??? sucralfate (CARAFATE) tablet 1 g  1 g Oral TID   ??? torsemide (DEMADEX) tablet 20 mg  20 mg Oral DAILY   ??? traZODone (DESYREL) tablet 100 mg  100 mg Oral QHS   ??? fluticasone propionate (FLONASE) 50 mcg/actuation nasal spray 2 Spray  2 Spray Both Nostrils DAILY PRN   ??? glucose chewable tablet 16 g  4 Tablet Oral PRN   ??? glucagon (GLUCAGEN)  injection 1 mg  1 mg IntraMUSCular PRN   ??? acetaminophen (TYLENOL) tablet 650 mg  650 mg Oral Q4H PRN   ??? dextrose 10% infusion 0-250 mL  0-250 mL IntraVENous PRN   ??? insulin lispro (HUMALOG) injection   SubCUTAneous AC&HS   ??? aspirin tablet 325 mg  325 mg Oral DAILY   ??? enoxaparin (LOVENOX) injection 30 mg  30 mg SubCUTAneous Q12H   ??? albuterol (PROVENTIL VENTOLIN) nebulizer solution 2.5 mg  2.5 mg Nebulization Q6H PRN        Lab Data Reviewed: (see below)  Lab Review:     Recent Labs     04/29/20  1929   WBC 7.5   HGB 14.1   HCT 42.0   PLT 98*     Recent Labs     04/30/20  0051 04/29/20  1929   NA  --  141   K  --  4.3   CL  --  101   CO2  --  33*   GLU  --  162*   BUN  --  18   CREA  --  1.37*   CA  --  9.5   MG  --  1.5*   ALB  --  3.7   TBILI  --  1.6*   ALT  --  23   INR 1.1  --      Lab Results   Component Value Date/Time    Glucose (POC) 166 (H) 04/29/2020 07:33 PM     No results for input(s): PH, PCO2, PO2, HCO3, FIO2 in the last 72 hours.  Recent Labs     04/30/20  0051   INR 1.1     All Micro Results     Procedure Component Value Units Date/Time    CULTURE, URINE [[id number]] Collected: 04/29/20 2325    Order Status: Completed Specimen: Urine from Clean catch Updated: 04/29/20 2330          Other pertinent lab: none    Total time spent with patient: 30 Minutes I personally reviewed chart, notes, data and current medications in the medical record.  I have personally examined and treated the patient at bedside during this period.                 Care Plan discussed with: Patient, Care  Manager, Nursing Staff, Consultant/Specialist and >50% of time spent in counseling and coordination of care    Discussed:  Care Plan and D/C Planning    Prophylaxis:  Lovenox and H2B/PPI    Disposition:  Home w/Family           ___________________________________________________    Attending Physician: Salvadore Dom, MD

## 2020-04-30 NOTE — Discharge Summary (Signed)
Physician Discharge Summary     Patient ID:  Deborah [name]  [id number]  65 y.o.  17-Mar-1955    Admit date: 04/29/2020    Discharge date of service and time: 04/30/2020    Admission Diagnoses: Arm paresthesia, left [R20.2]    Discharge Diagnoses:    Principal Diagnosis   Arm paresthesia, left                                             Hospital Course and other diagnoses  Arm paresthesia, left - POA, I do not think this is TIA or CVA.  I think this is more likely radiculopathy.  We cannot get head or neck MRI due to her pacemaker  Nevertheless, TIA workup. Unremarkable tele. Neuro consulted. Stable neurochecks.  ECHO heart, but she already has plan for outpatient TEE. Check Lipids, TSH and A1c. Continue statin, ASA at home. No needs for PT/OT eval.  ??  Chronic pain / Chronic narcotic use / Neuropathy - POA, usually on Xtampa and gabapentin, managed by a pain specialist. May need referral to ortho spine.  ??  Anxiety and depression - This may contribute to her sensation of symptoms.  Continue celexa and trazondone  ??  DM type 2 causing renal disease - Diabetic diet and counseling.  SSI per protocol.  Resume home metformin and amaryl. Check A1c.  ??  OSA on CPAP / Morbid obesity - Advise weight loss and continued compliance wih CPAP.  ??  Thrombocytopenia - POA, mild, unclear etiology.  PCP to monitor.  ??  CKD (chronic kidney disease), stage III - Appears stable. Not AKI.  Not IVVD  ??  Aortic valve replaced / Pacemaker - Appears stable.  Continue amiodarone and torsemide, stop IVF. Has follow up planned  ??  Hypothyroid - continue synthroid  ??  Asthma / Rhinitis - I doubt true asthma.  Likely GERD. Not on long acting meds.  Continue sinulair, flonase.  ??  Hyperlipidemia - Continue atorvastatin  ??  GERD (gastroesophageal reflux disease) - Start PPI, continue sucralfate.  GERD likely explains chest pain and asthma symptoms  ??  PCP: Arcola Jansky, NP    Consults: Neurology    Significant Diagnostic Studies: See  Hospital Course    Discharged home in improved condition.    Discharge Exam:  BP 136/79 (BP 1 Location: Right lower arm, BP Patient Position: At rest)   Pulse 66   Temp 97.7 ??F (36.5 ??C)   Resp 22   Ht 5\' 7"  (1.702 m)   Wt 149 kg (328 lb 7.8 oz)   SpO2 96%   BMI 51.45 kg/m??   ??  Gen:  Morbid obese, in no acute distress  HEENT:  Pink conjunctivae, PERRL, hearing intact to voice, moist mucous membranes  Neck:  Supple, without masses, thyroid non-tender  Resp:  No accessory muscle use, distant breath sounds without wheezes rales or rhonchi  Card:  No murmurs, distant S1, S2 without thrills, bruits, 2+ peripheral edema  Abd:  Soft, non-tender, non-distended, distant bowel sounds are present, no mass  Lymph:  No cervical or inguinal adenopathy  Musc:  No cyanosis or clubbing  Skin:  No rashes or ulcers, skin turgor is good  Neuro:  Cranial nerves are grossly intact, general motor weakness, follows commands   Psych:  Poor insight, oriented to person, place and time, alert  Patient Instructions:   Current Discharge Medication List      START taking these medications    Details   aspirin 81 mg cap Take 81 mg by mouth daily for 30 days.  Qty: 30 Capsule, Refills: 0         CONTINUE these medications which have CHANGED    Details   cholecalciferol (VITAMIN D3) (5000 Units /125 mcg) capsule Take 1 Capsule by mouth daily for 30 days.  Qty: 30 Capsule, Refills: 0         CONTINUE these medications which have NOT CHANGED    Details   amiodarone (CORDARONE) 200 mg tablet Take 1 Tablet by mouth daily.  Qty: 30 Tablet, Refills: 3      acetaminophen (Tylenol Extra Strength) 500 mg tablet Take 1,000 mg by mouth every six (6) hours as needed for Pain.      atorvastatin (LIPITOR) 80 mg tablet       calcium-cholecalciferol, D3, (CALTRATE 600+D) tablet Take 1 Tablet by mouth nightly.      cholecalciferol (Vitamin D3) (1000 Units /25 mcg) tablet Take 1,000 Units by mouth daily.      citalopram (CELEXA) 40 mg tablet Take 40 mg by mouth  daily.      fluticasone propionate (FLONASE) 50 mcg/actuation nasal spray 2 Sprays by Nasal route daily as needed.      levothyroxine (SYNTHROID) 137 mcg tablet TAKE 1 BY MOUTH DAILY, FOLLOW UP IN 90 DAYS FOR RECHECK THYROID LEVEL      metFORMIN (GLUCOPHAGE) 500 mg tablet Take 750 mg by mouth two (2) times daily (with meals).      sucralfate (CARAFATE) 1 gram tablet Take 1 g by mouth three (3) times daily.      traZODone (DESYREL) 100 mg tablet Take 100 mg by mouth nightly.      Xtampza ER 9 mg capsule TAKE 1 CAPSULE BY MOUTH EVERY 12 HOURS FILL 02/12/20      glimepiride (AMARYL) 2 mg tablet Take 2 mg by mouth two (2) times a day.      montelukast (SINGULAIR) 10 mg tablet Take 10 mg by mouth nightly.      albuterol (PROVENTIL HFA, VENTOLIN HFA, PROAIR HFA) 90 mcg/actuation inhaler Take 2 Puffs by inhalation every six (6) hours as needed.      gabapentin (NEURONTIN) 600 mg tablet TAKE 1 TABLET BY MOUTH 4 TIMES A DAY      torsemide (DEMADEX) 20 mg tablet TAKE 1 BY MOUTH DAILY F/U WITH CARDIOLOGY      EPINEPHrine (EPIPEN) 0.3 mg/0.3 mL injection            Activity: Activity as tolerated  Diet: Cardiac Diet, Diabetic Diet and Low fat, Low cholesterol  Wound Care: None needed    Follow-up with your PCP in a few weeks.  Follow-up tests/labs - none    Signed:  Salvadore Dom, MD  04/30/2020  12:57 PM

## 2020-04-30 NOTE — Progress Notes (Signed)
This patient was assisted with Intentional Toileting every 2 hours during this shift as appropriate.  Documentation of ambulation and output reflected on Flowsheet as appropriate.  Purposeful hourly rounding was completed using AIDET and 5Ps.  Outcomes of PHR documented as they occurred. Bed alarm in use as appropriate.  Dual Suction and ambubag in place.

## 2020-04-30 NOTE — Consults (Signed)
NEUROLOGY CONSULT NOTE    Patient ID:  Deborah [name]  [id number]  64 y.o.  1955-12-28    Date of Consultation:  April 30, 2020    Referring Physician: Dr. Trevor Mace    Reason for Consultation: left arm numbness/tingling, left facial droop and pupil changes    History of Present Illness:     Patient Active Problem List    Diagnosis Date Noted   ??? Thrombocytopenia ([place]) 04/30/2020   ??? Morbid obesity ([place]) 04/30/2020   ??? CKD (chronic kidney disease), stage III ([place]) 04/30/2020   ??? Aortic valve replaced    ??? Pacemaker    ??? Hypothyroid    ??? Asthma    ??? Anxiety and depression    ??? 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 Chronic narcotic use    ??? Chronic pain    ??? Rhinitis    ??? Hyperlipidemia    ??? Neuropathy    ??? GERD (gastroesophageal reflux disease)    ??? Arm paresthesia, left 04/29/2020     Past Medical History:   Diagnosis Date   ??? Anxiety and depression    ??? Aortic valve replaced    ??? Asthma    ??? Chronic narcotic use    ??? Chronic pain    ??? CKD (chronic kidney disease), stage III ([place])    ??? 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 GERD (gastroesophageal reflux disease)    ??? Hyperlipidemia    ??? Hypothyroid    ??? Morbid obesity ([place])    ??? Neuropathy    ??? Pacemaker    ??? Rhinitis       No past surgical history on file.   Prior to Admission medications    Medication Sig Start Date End Date Taking? Authorizing Provider   amiodarone (CORDARONE) 200 mg tablet Take 1 Tablet by mouth daily. 04/24/20  Yes Grier Mitts, NP   acetaminophen (Tylenol Extra Strength) 500 mg tablet Take 1,000 mg by mouth every six (6) hours as needed for Pain.   Yes Provider, Historical   atorvastatin (LIPITOR) 80 mg tablet  01/04/20  Yes Provider, Historical   calcium-cholecalciferol, D3, (CALTRATE 600+D) tablet Take 1 Tablet by mouth nightly.   Yes Provider, Historical   cholecalciferol (Vitamin D3) (1000 Units /25 mcg) tablet Take 1,000 Units by mouth daily.   Yes Provider, Historical   citalopram (CELEXA) 40 mg tablet Take 40 mg by mouth daily.  02/22/20  Yes Provider, Historical   fluticasone propionate (FLONASE) 50 mcg/actuation nasal spray 2 Sprays by Nasal route daily as needed.   Yes Provider, Historical   levothyroxine (SYNTHROID) 137 mcg tablet TAKE 1 BY MOUTH DAILY, FOLLOW UP IN 90 DAYS FOR RECHECK THYROID LEVEL 02/24/20  Yes Provider, Historical   metFORMIN (GLUCOPHAGE) 500 mg tablet Take 750 mg by mouth two (2) times daily (with meals). 01/13/20  Yes Provider, Historical   sucralfate (CARAFATE) 1 gram tablet Take 1 g by mouth three (3) times daily.   Yes Provider, Historical   traZODone (DESYREL) 100 mg tablet Take 100 mg by mouth nightly. 12/31/19  Yes Provider, Historical   Xtampza ER 9 mg capsule TAKE 1 CAPSULE BY MOUTH EVERY 12 HOURS FILL 02/12/20 02/24/20  Yes Provider, Historical   cholecalciferol (VITAMIN D3) (5000 Units /125 mcg) capsule Take 5,000 mg by mouth daily.   Yes Provider, Historical   glimepiride (AMARYL) 2 mg tablet Take 2 mg by mouth two (2) times a  day.   Yes Provider, Historical   montelukast (SINGULAIR) 10 mg tablet Take 10 mg by mouth nightly.   Yes Provider, Historical   albuterol (PROVENTIL HFA, VENTOLIN HFA, PROAIR HFA) 90 mcg/actuation inhaler Take 2 Puffs by inhalation every six (6) hours as needed.    Provider, Historical   gabapentin (NEURONTIN) 600 mg tablet TAKE 1 TABLET BY MOUTH 4 TIMES A DAY 02/22/20   Provider, Historical   torsemide (DEMADEX) 20 mg tablet TAKE 1 BY MOUTH DAILY F/U WITH CARDIOLOGY 02/22/20   Provider, Historical   EPINEPHrine (EPIPEN) 0.3 mg/0.3 mL injection  01/11/20   Provider, Historical     Allergies   Allergen Reactions   ??? Nitroglycerin Unknown (comments)     hypotension   ??? Hydrochlorothiazide Other (comments)     Reports 'kidneys dry up"       Social History     Tobacco Use   ??? Smoking status: Former Smoker     Packs/day: 1.00     Years: 20.00     Pack years: 20.00     Quit date: 03/21/2010     Years since quitting: 10.1   ??? Smokeless tobacco: Never Used   Substance Use Topics   ??? Alcohol use:  Not Currently      No family history on file.     Subjective:      Deborah [name] is a 65 y.o. female with history of valvular heart disease status post replacement, status post pacemaker placement, asthma, OSA and thyroid disease was admitted from the ER for numbness and tingling and facial droop.    Patient has been having issues with drops in blood sugar.  And day before ER visit at around around 4 PM she checked her blood sugar was 60.  She was not feeling well and was getting a migraine headache.  Then she noticed that her left arm seem to go numb and tingly.  Sounds mostly noted in the left face droop and left pupil was pinpoint compared to the other.  Per patient this and then recurred 1 day of admission.  She mentions prior to these episodes occurring she had some chest pain.  Left-sided chest tightness.  In the ER blood pressure was 103/64.  NIH stroke scale was 1.  Labs revealed elevated glucose at 166, slightly elevated proBNP at 153, low magnesium at 1.5, CMP showing increased creatinine, decreased GFR, increased total bilirubin, low platelet at 98, urinalysis positive for UTI and slightly elevated hemoglobin A1c at 6.1. Unremarkable lipase, troponin, PTT, INR, lipid panel (LDL 12.6).  Head CT without contrast did not reveal any acute process.  Essentially normal.  CT perfusion study was unremarkable.  Head and neck CTA with contrast revealed no acute large vessel arterial occlusion.  Mild atherosclerosis.  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  Moderate to severe degenerative changes in the cervical spine.  Chest x-ray did not reveal any acute process.  Patient seen by teleneurology and deemed not a candidate for TPA.    When seen, patient reports resolution of her symptoms.  Just feels generally weak.  She showed me a copy of her pacemaker and it was placed in 2010.  No mention about whether it is MRI compatible.    Outside reports reviewed: office notes, ER records,  radiology reports, lab reports.    Review of Systems:    Pertinent items are noted in HPI.    Objective:     Patient Vitals for the  past 8 hrs:   BP Temp Pulse Resp SpO2 Height Weight   04/30/20 0910 115/75 ??? ??? ??? ??? _0  (1.702 m) 148.8 kg (328 lb)   04/30/20 0726 136/79 97.7 ??F (36.5 ??C) 66 22 96 % ??? ???   04/30/20 0713 ??? ??? 67 ??? ??? ??? ???   04/30/20 0553 ??? ??? ??? ??? ??? ??? 149 kg (328 lb 7.8 oz)   04/30/20 0344 ??? ??? ??? ??? ??? ??? 151.4 kg (333 lb 12.4 oz)   04/30/20 0343 111/68 97.5 ??F (36.4 ??C) 66 13 94 % ??? ???     PHYSICAL EXAM:    NEUROLOGICAL EXAM:    Appearance:  The patient is obese, provides a coherent history and is in no acute distress.   Mental Status: Oriented to time, place and person. Fluent, no aphasia or dysarthria. Mood and affect appropriate.   Cranial Nerves:   Intact visual fields. PERLA, EOM's full, no nystagmus, no ptosis. Facial sensation is normal. Corneal reflexes are intact. Facial movement is symmetric. Hearing is normal bilaterally. Palate is midline with normal elevation. Sternocleidomastoid and trapezius muscles are normal. Tongue is midline.   Motor:  5/5 strength in upper and lower proximal and distal muscles. Normal bulk and tone. No fasciculations. No pronator drift.    Reflexes:   Deep tendon reflexes 1+/4 and symmetrical. Downgoing toes.   Sensory:   Normal to cold and vibration.   Gait:  Not tested.   Tremor:   No tremor noted.   Cerebellar:  Intact FTN/RAM/HTS.       Imaging  CT Head, head/neck CTA: reviewed    Lab Review    Recent Results (from the past 24 hour(s))   CBC WITH AUTOMATED DIFF    Collection Time: 04/29/20  7:29 PM   Result Value Ref Range    WBC 7.5 3.6 - 11.0 K/uL    RBC 4.48 3.80 - 5.20 M/uL    HGB 14.1 11.5 - 16.0 g/dL    HCT 42.0 35.0 - 47.0 %    MCV 93.8 80.0 - 99.0 FL    MCH 31.5 26.0 - 34.0 PG    MCHC 33.6 30.0 - 36.5 g/dL    RDW 13.7 11.5 - 14.5 %    PLATELET 98 (L) 150 - 400 K/uL    MPV 10.6 8.9 - 12.9 FL    NRBC 0.0 0 PER 100 WBC    ABSOLUTE NRBC 0.00 0.00 - 0.01 K/uL     NEUTROPHILS 52 32 - 75 %    LYMPHOCYTES 37 12 - 49 %    MONOCYTES 7 5 - 13 %    EOSINOPHILS 3 0 - 7 %    BASOPHILS 1 0 - 1 %    IMMATURE GRANULOCYTES 0 0.0 - 0.5 %    ABS. NEUTROPHILS 3.9 1.8 - 8.0 K/UL    ABS. LYMPHOCYTES 2.8 0.8 - 3.5 K/UL    ABS. MONOCYTES 0.5 0.0 - 1.0 K/UL    ABS. EOSINOPHILS 0.2 0.0 - 0.4 K/UL    ABS. BASOPHILS 0.1 0.0 - 0.1 K/UL    ABS. IMM. GRANS. 0.0 0.00 - 0.04 K/UL    DF SMEAR SCANNED      PLATELET COMMENTS Large Platelets      RBC COMMENTS NORMOCYTIC, NORMOCHROMIC      WBC COMMENTS REACTIVE LYMPHS     SAMPLES BEING HELD    Collection Time: 04/29/20  7:29 PM   Result Value Ref Range    SAMPLES  BEING HELD 1SST,     COMMENT        Add-on orders for these samples will be processed based on acceptable specimen integrity and analyte stability, which may vary by analyte.   LIPASE    Collection Time: 04/29/20  7:29 PM   Result Value Ref Range    Lipase 329 73 - 393 U/L   MAGNESIUM    Collection Time: 04/29/20  7:29 PM   Result Value Ref Range    Magnesium 1.5 (L) 1.6 - 2.4 mg/dL   METABOLIC PANEL, COMPREHENSIVE    Collection Time: 04/29/20  7:29 PM   Result Value Ref Range    Sodium 141 136 - 145 mmol/L    Potassium 4.3 3.5 - 5.1 mmol/L    Chloride 101 97 - 108 mmol/L    CO2 33 (H) 21 - 32 mmol/L    Anion gap 7 5 - 15 mmol/L    Glucose 162 (H) 65 - 100 mg/dL    BUN 18 6 - 20 MG/DL    Creatinine 1.37 (H) 0.55 - 1.02 MG/DL    BUN/Creatinine ratio 13 12 - 20      GFR est AA 47 (L) >60 ml/min/1.64m    GFR est non-AA 39 (L) >60 ml/min/1.769m   Calcium 9.5 8.5 - 10.1 MG/DL    Bilirubin, total 1.6 (H) 0.2 - 1.0 MG/DL    ALT (SGPT) 23 12 - 78 U/L    AST (SGOT) 18 15 - 37 U/L    Alk. phosphatase 67 45 - 117 U/L    Protein, total 7.5 6.4 - 8.2 g/dL    Albumin 3.7 3.5 - 5.0 g/dL    Globulin 3.8 2.0 - 4.0 g/dL    A-G Ratio 1.0 (L) 1.1 - 2.2     NT-PRO BNP    Collection Time: 04/29/20  7:29 PM   Result Value Ref Range    NT pro-BNP 153 (H) <125 PG/ML   TROPONIN-HIGH SENSITIVITY    Collection Time: 04/29/20   7:29 PM   Result Value Ref Range    Troponin-High Sensitivity 11 0 - 51 ng/L   GLUCOSE, POC    Collection Time: 04/29/20  7:33 PM   Result Value Ref Range    Glucose (POC) 166 (H) 65 - 117 mg/dL    Performed by EVBaruch Gouty/MICROSCOPIC    Collection Time: 04/29/20  9:01 PM   Result Value Ref Range    Color YELLOW/STRAW      Appearance CLEAR CLEAR      Specific gravity 1.022 1.003 - 1.030      pH (UA) 5.0 5.0 - 8.0      Protein Negative NEG mg/dL    Glucose Negative NEG mg/dL    Ketone Negative NEG mg/dL    Bilirubin Negative NEG      Blood Negative NEG      Urobilinogen 0.2 0.2 - 1.0 EU/dL    Nitrites Negative NEG      Leukocyte Esterase MODERATE (A) NEG      WBC 20-50 0 - 4 /hpf    RBC 0-5 0 - 5 /hpf    Epithelial cells FEW FEW /lpf    Bacteria Negative NEG /hpf    Hyaline cast 0-2 0 - 2 /lpf   PROTHROMBIN TIME + INR    Collection Time: 04/30/20 12:51 AM   Result Value Ref Range    INR 1.1 0.9 - 1.1  Prothrombin time 11.6 (H) 9.0 - 11.1 sec   LIPID PANEL    Collection Time: 04/30/20 12:57 AM   Result Value Ref Range    Cholesterol, total 106 <200 MG/DL    Triglyceride 87 <150 MG/DL    HDL Cholesterol 76 MG/DL    LDL, calculated 12.6 0 - 100 MG/DL    VLDL, calculated 17.4 MG/DL    CHOL/HDL Ratio 1.4 0.0 - 5.0     HEMOGLOBIN A1C WITH EAG    Collection Time: 04/30/20 12:57 AM   Result Value Ref Range    Hemoglobin A1c 6.1 (H) 4.0 - 5.6 %    Est. average glucose 128 mg/dL   TROPONIN-HIGH SENSITIVITY    Collection Time: 04/30/20 12:57 AM   Result Value Ref Range    Troponin-High Sensitivity 9 0 - 51 ng/L   GLUCOSE, POC    Collection Time: 04/30/20  8:01 AM   Result Value Ref Range    Glucose (POC) 93 65 - 117 mg/dL    Performed by Judeth Horn (pct)    ECHO ADULT COMPLETE    Collection Time: 04/30/20  9:47 AM   Result Value Ref Range    IVSd 1.2 (A) 0.6 - 0.9 cm    LVIDd 4.9 3.9 - 5.3 cm    LVIDs 3.2 cm    LVOT Diameter 2.3 cm    LVPWd 1.2 (A) 0.6 - 0.9 cm    LVOT Peak Gradient 3 mmHg    LVOT  Mean Gradient 1 mmHg    LVOT SV 78.1 ml    LVOT Peak Velocity 0.8 m/s    LVOT VTI 18.8 cm    RVOT Peak Gradient 2 mmHg    RVOT Peak Velocity 0.7 m/s    LA Diameter 3.8 cm    AV Area by Peak Velocity 1.0 cm2    AV Area by VTI 1.0 cm2    AV Peak Gradient 46 mmHg    AV Mean Gradient 25 mmHg    AV Peak Velocity 3.4 m/s    AV Mean Velocity 2.4 m/s    AV VTI 76.1 cm    MV A Velocity 0.76 m/s    MV E Wave Deceleration Time 283.6 ms    MV E Velocity 0.60 m/s    LV E' Lateral Velocity 7 cm/s    LV E' Septal Velocity 7 cm/s    PV Peak Gradient 4 mmHg    PV Max Velocity 1.0 m/s    Fractional Shortening 2D 35 28 - 44 %    LVIDd Index 1.96 cm/m2    LVIDs Index 1.28 cm/m2    LV RWT Ratio 0.49     LV Mass 2D 226.4 (A) 67 - 162 g    LV Mass 2D Index 90.6 43 - 95 g/m2    MV E/A 0.79     E/E' Ratio (Averaged) 8.57     E/E' Lateral 8.57     E/E' Septal 8.57     LVOT Stroke Volume Index 31.2 mL/m2    LVOT Area 4.2 cm2    LA Size Index 1.52 cm/m2    AV Velocity Ratio 0.24     LVOT:AV VTI Index 0.25     AVA/BSA VTI 0.4 cm2/m2    AVA/BSA Peak Velocity 0.4 cm2/m2         Assessment:   TIA  Cervical spondylosis    Plan:   Neurological examination is currently nonfocal.  No residual deficits.  Worse case scenario patient had  a TIA triggered by hypoglycemia and hypotension.  Patient recently started on amiodarone and had to be lowered due to side effects last 04/24/2020.    Head CT without contrast did not reveal any acute process.  Essentially normal.  CT perfusion study was unremarkable.  Head and neck CTA did not reveal any flow-limiting stenosis or aneurysm.  No ICA stenosis.  Brain MRI is pending.  If pacer is not compatible then no need for further neuroimaging.    Head and neck CTA also revealed moderate to severe degenerative changes in the cervical spine.  Cervical MRI was ordered to further assess.    LDL is at goal.    Echocardiogram results are pending.  Unclear to me why patient is not on anticoagulation given history of aortic  valve replacement.  May need cardiology opinion.    Continue aspirin 325 mg daily for stroke prophylaxis.    Further intervention be done pending results of testing.    Thank you for the consult.    This note was created using voice recognition software. Despite editing, there may be syntax errors.

## 2020-04-30 NOTE — ED Notes (Signed)
TRANSFER - OUT REPORT:    Verbal report given to Mardene Celeste, Charity fundraiser (name) on Aaryanna Ricardo  being transferred to 322 (unit) for routine progression of care       Report consisted of patient's Situation, Background, Assessment and   Recommendations(SBAR).     Information from the following report(s) ED Summary was reviewed with the receiving nurse.    Lines:   Peripheral IV 04/29/20 Left Antecubital (Active)        Opportunity for questions and clarification was provided.      Patient transported with:   Registered Nurse

## 2020-04-30 NOTE — Progress Notes (Signed)
 0700  Bedside and Verbal shift change report given to Philippe MATSU RN (oncoming nurse) by Idell NOVAK RN (offgoing nurse). Report included the following information SBAR, Kardex, Procedure Summary, Intake/Output, MAR, Accordion, Recent Results and Cardiac Rhythm V paced.    Initial assessment done. AOX4. 3/10 pain on the left chest not new according to the patient. Numbness on left arm and toes and on the right toes. Right grip is weaker than the left. Aware to call before getting out of bed. Will continue to monitor.     Visit Vitals  BP 96/69 (BP 1 Location: Right lower arm, BP Patient Position: At rest)   Pulse 66   Temp 98.7 F (37.1 C)   Resp 19   Ht 5' 7 (1.702 m)   Wt 148.8 kg (328 lb)   SpO2 97%   BMI 51.37 kg/m     1344  I have reviewed discharge instructions with the patient and caregiver.  The patient and caregiver verbalized understanding.

## 2020-04-30 NOTE — Progress Notes (Signed)
Problem: Patient Education: Go to Patient Education Activity  Goal: Patient/Family Education  Outcome: Progressing Towards Goal     Problem: TIA/CVA Stroke: 0-24 hours  Goal: Off Pathway (Use only if patient is Off Pathway)  Outcome: Progressing Towards Goal  Goal: Activity/Safety  Outcome: Progressing Towards Goal  Goal: Consults, if ordered  Outcome: Progressing Towards Goal  Goal: Diagnostic Test/Procedures  Outcome: Progressing Towards Goal  Goal: Nutrition/Diet  Outcome: Progressing Towards Goal  Goal: Discharge Planning  Outcome: Progressing Towards Goal  Goal: Medications  Outcome: Progressing Towards Goal  Goal: Respiratory  Outcome: Progressing Towards Goal  Goal: Treatments/Interventions/Procedures  Outcome: Progressing Towards Goal  Goal: Minimize risk of bleeding post-thrombolytic infusion  Outcome: Progressing Towards Goal  Goal: Monitor for complications post-thrombolytic infusion  Outcome: Progressing Towards Goal  Goal: Psychosocial  Outcome: Progressing Towards Goal  Goal: *Hemodynamically stable  Outcome: Progressing Towards Goal  Goal: *Neurologically stable  Description: Absence of additional neurological deficits    Outcome: Progressing Towards Goal  Goal: *Verbalizes anxiety and depression are reduced or absent  Outcome: Progressing Towards Goal  Goal: *Absence of Signs of Aspiration on Current Diet  Outcome: Progressing Towards Goal  Goal: *Absence of deep venous thrombosis signs and symptoms(Stroke Metric)  Outcome: Progressing Towards Goal  Goal: *Ability to perform ADLs and demonstrates progressive mobility and function  Outcome: Progressing Towards Goal  Goal: *Stroke education started(Stroke Metric)  Outcome: Progressing Towards Goal  Goal: *Dysphagia screen performed(Stroke Metric)  Outcome: Progressing Towards Goal  Goal: *Rehab consulted(Stroke Metric)  Outcome: Progressing Towards Goal     Problem: Falls - Risk of  Goal: *Absence of Falls  Description: Document Schmid Fall Risk and  appropriate interventions in the flowsheet.  Outcome: Progressing Towards Goal  Note: Fall Risk Interventions:                                Problem: Patient Education: Go to Patient Education Activity  Goal: Patient/Family Education  Outcome: Progressing Towards Goal

## 2020-05-01 LAB — CULTURE, URINE
Culture result:: NO GROWTH
Culture: NO GROWTH

## 2020-05-02 LAB — EKG, 12 LEAD, INITIAL
Atrial Rate: 79 {beats}/min
Calculated P Axis: 63 degrees
Calculated R Axis: -73 degrees
Calculated T Axis: 83 degrees
Diagnosis: NORMAL
P-R Interval: 222 ms
Q-T Interval: 444 ms
QRS Duration: 116 ms
QTC Calculation (Bezet): 509 ms
Ventricular Rate: 79 {beats}/min

## 2020-05-02 LAB — EKG 12-LEAD
Atrial Rate: 79 {beats}/min
Diagnosis: NORMAL
P Axis: 63 degrees
P-R Interval: 222 ms
Q-T Interval: 444 ms
QRS Duration: 116 ms
QTc Calculation (Bazett): 509 ms
R Axis: -73 degrees
T Axis: 83 degrees
Ventricular Rate: 79 {beats}/min

## 2020-05-02 NOTE — Telephone Encounter (Signed)
Faxed MRI form to [facility] at fax number [phone].  Fax confirmation received.

## 2020-05-11 NOTE — Telephone Encounter (Signed)
Attempted to reach patient by telephone.  Voicemail box full and unable to leave a message.    Will try patient again at a later time.

## 2020-05-12 NOTE — Telephone Encounter (Signed)
Attempted to reach patient by telephone. HIPAA compliant message was left for return call.

## 2020-05-12 NOTE — Telephone Encounter (Signed)
Received call from patient, ID verified using two patient identifiers.  Notified patient that her procedure next week needs to be rescheduled.  Patient agreeable and rescheduled for Tuesday, 05/23/20 at [facility].      Asked to arrive at 2:00 pm.  Patient verbalized understanding and will call with any other questions.      Cath lab called and Jared notified of date change.

## 2020-05-16 ENCOUNTER — Inpatient Hospital Stay: Payer: MEDICARE | Attending: Clinical Cardiac Electrophysiology | Primary: Family

## 2020-05-22 NOTE — Addendum Note (Signed)
Addendum  Note by Thurston Pounds, MD at 05/22/20 1441                Author: Thurston Pounds, MD  Service: --  Author Type: Physician       Filed: 05/22/20 1814  Encounter Date: 05/22/2020  Status: Signed          Editor: Thurston Pounds, MD (Physician)          Addended by: Thurston Pounds on: 05/22/2020 06:14 PM    Modules accepted: Orders

## 2020-05-23 ENCOUNTER — Ambulatory Visit

## 2020-05-23 ENCOUNTER — Inpatient Hospital Stay: Payer: MEDICARE | Attending: Clinical Cardiac Electrophysiology | Primary: Family

## 2020-05-23 DIAGNOSIS — Z952 Presence of prosthetic heart valve: Secondary | ICD-10-CM

## 2020-05-23 LAB — GLUCOSE, POC: Glucose (POC): 81 mg/dL (ref 65–117)

## 2020-05-23 LAB — POCT GLUCOSE: POC Glucose: 81 mg/dL (ref 65–117)

## 2020-05-23 MED ORDER — SODIUM CHLORIDE 0.9 % IJ SYRG
Freq: Three times a day (TID) | INTRAMUSCULAR | Status: DC
Start: 2020-05-23 — End: 2020-05-23

## 2020-05-23 MED ORDER — SODIUM CHLORIDE 0.9 % IJ SYRG
INTRAMUSCULAR | Status: DC | PRN
Start: 2020-05-23 — End: 2020-05-23

## 2020-05-23 MED FILL — BD POSIFLUSH NORMAL SALINE 0.9 % INJECTION SYRINGE: INTRAMUSCULAR | Qty: 40

## 2020-05-23 NOTE — H&P (Signed)
H&P by Thurston Pounds, MD at  05/23/20 1625                Author: Thurston Pounds, MD  Service: Clinical Cardiac Electrophysiology  Author Type: Physician       Filed: 05/23/20 2328  Date of Service: 05/23/20 1625  Status: Signed          Editor: Thurston Pounds, MD (Physician)                          Cardiac Electrophysiology H/P Note          Subjective:         Deborah [name] is a 65 y.o. patient  who is seen for evaluation of AVR   It was not well seen on 2 D echo due to poor window      She has Environmental manager dual chamber pacemaker   She had AVR in [place] and pacer implanted there for presumable av block   She Moved to [facility] and saw Dr Welton Flakes (not with [facility])   She now changed practice to [facility] office   She has Altrua dual chamber pacer with 2.5 years of battery left   RA pacing 36%, RV pacing 29% on device check   She cannot use [employer] remote box due to older 200 series Altrua model   Echo ? Was done one year ago with Dr Welton Flakes when she saw him      She had used CPAP but not currently and wants to see sleep study specialist              Current Outpatient Medications          Medication  Sig  Dispense  Refill           ?  aspirin 81 mg cap  Take 81 mg by mouth daily for 30 days.  30 Capsule  0     ?  cholecalciferol (VITAMIN D3) (5000 Units /125 mcg) capsule  Take 1 Capsule by mouth daily for 30 days.  30 Capsule  0     ?  amiodarone (CORDARONE) 200 mg tablet  Take 1 Tablet by mouth daily.  30 Tablet  3     ?  acetaminophen (Tylenol Extra Strength) 500 mg tablet  Take 1,000 mg by mouth every six (6) hours as needed for Pain.         ?  albuterol (PROVENTIL HFA, VENTOLIN HFA, PROAIR HFA) 90 mcg/actuation inhaler  Take 2 Puffs by inhalation every six (6) hours as needed.         ?  atorvastatin (LIPITOR) 80 mg tablet           ?  calcium-cholecalciferol, D3, (CALTRATE 600+D) tablet  Take 1 Tablet by mouth nightly.         ?  cholecalciferol (Vitamin D3) (1000 Units /25 mcg) tablet  Take 1,000 Units by mouth daily.          ?  citalopram (CELEXA) 40 mg tablet  Take 40 mg by mouth daily.         ?  fluticasone propionate (FLONASE) 50 mcg/actuation nasal spray  2 Sprays by Nasal route daily as needed.         ?  gabapentin (NEURONTIN) 600 mg tablet  TAKE 1 TABLET BY MOUTH 4 TIMES A DAY         ?  levothyroxine (SYNTHROID) 137 mcg tablet  TAKE 1  BY MOUTH DAILY, FOLLOW UP IN 90 DAYS FOR RECHECK THYROID LEVEL         ?  metFORMIN (GLUCOPHAGE) 500 mg tablet  Take 750 mg by mouth two (2) times daily (with meals).         ?  sucralfate (CARAFATE) 1 gram tablet  Take 1 g by mouth three (3) times daily.         ?  torsemide (DEMADEX) 20 mg tablet  TAKE 1 BY MOUTH DAILY F/U WITH CARDIOLOGY         ?  traZODone (DESYREL) 100 mg tablet  Take 100 mg by mouth nightly.         ?  Xtampza ER 9 mg capsule  TAKE 1 CAPSULE BY MOUTH EVERY 12 HOURS FILL 02/12/20         ?  glimepiride (AMARYL) 2 mg tablet  Take 2 mg by mouth two (2) times a day.         ?  montelukast (SINGULAIR) 10 mg tablet  Take 10 mg by mouth nightly.               ?  EPINEPHrine (EPIPEN) 0.3 mg/0.3 mL injection                Allergies        Allergen  Reactions         ?  Nitroglycerin  Unknown (comments)             hypotension         ?  Aloe Vera  Rash     ?  Hydrochlorothiazide  Other (comments)             Reports 'kidneys dry up"           Past Medical History:        Diagnosis  Date         ?  Anxiety and depression       ?  Aortic valve replaced       ?  Asthma       ?  Chronic narcotic use       ?  Chronic pain       ?  CKD (chronic kidney disease), stage III (HCC)       ?  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  GERD (gastroesophageal reflux disease)       ?  Hyperlipidemia       ?  Hypothyroid       ?  Morbid obesity (HCC)       ?  Neuropathy       ?  Pacemaker           ?  Rhinitis          surgical history AVR   No pacer in family history.     Social History          Tobacco Use         ?  Smoking status:  Former Smoker              Packs/day:  1.00          Years:  20.00         Pack years:  20.00         Quit date:  03/21/2010         Years since quitting:  10.1         ?  Smokeless tobacco:  Never Used       Substance Use Topics         ?  Alcohol use:  Not Currently            Review of Systems:    Constitutional: Negative for fever, chills, weight loss, + malaise/fatigue.    HEENT: Negative for nosebleeds, vision changes.    Respiratory: Negative for cough, hemoptysis   Cardiovascular: Negative for chest pain, palpitations, orthopnea, claudication, + leg swelling, no syncope, and PND.    Gastrointestinal: Negative for nausea, vomiting, diarrhea, blood in stool and melena.    Genitourinary: Negative for dysuria, and hematuria.    Musculoskeletal: Negative for myalgias, + arthralgia.    Skin: Negative for rash.    Heme: Does not bleed or bruise easily.    Neurological: Negative for speech change           Objective:        Visit Vitals      BP  (!) 125/95 (BP 1 Location: Right lower arm, BP Patient Position: At rest)     Pulse  60     Temp  97 ??F (36.1 ??C)     Resp  15     Ht  5\' 7"  (1.702 m)     Wt  324 lb 8 oz (147.2 kg)     SpO2  93%     Breastfeeding  No        BMI  50.82 kg/m??         Physical Exam:    Constitutional: well-developed and well-nourished. No respiratory  distress.    Head: Normocephalic and atraumatic.    Eyes: Pupils are equal, round   ENT: hearing normal   Neck: supple. No JVD present.    Cardiovascular: Normal rate, regular rhythm. No murmur heard.   Pulmonary/Chest: Effort normal and breath sounds normal. No wheezes.    Abdominal: Soft, no tenderness. Morbidly obese  Musculoskeletal: 1+ leg edema.   Neurological: alert,oriented.    Skin: Skin is warm and dry sternal old scar and left chest pacer pocket is fine  Psychiatric: normal mood and affect. Behavior is normal. Judgment and thought content normal.              Assessment/Plan:     AVR: in [place]   echo result from previous cardiologist.  Valve was not well seen either      She uses CPAP for  OSA         2 D Echo is severely limited   LVH present 1.2-1.4 cm   LVEF normal   Valves not well visualized   Doppler gradient obtained and showed moderate gradient   Loop recorder has shown PVCs and NSVT      She was set up for TEE today      Patient's not able to lie flat, this is chronic   She does not appear to be in acute CHF   She is morbidly obese and had tracheostomy closed before with "TEE probe stuck with tortuous esophagus" in the past in [place] and she was told esophagus was tortuous    so TEE will be cancelled tonight.  There was no anesthesiologist scheduled for this procedure and I did not think she needs to have TEE urgently.  She does not have acute CHF or suspected to have valve malfunction.     She agrees to cancel TEE today   Will continue to monitor her condition in  office        Future Appointments           Date  Time  Provider  Department  Center           06/21/2020   1:40 PM  PACEMAKER, [facility]  [facility]  [facility]           Thank you for involving me in this patient's care and please call with further concerns or questions.         Juliet RudeMatthew M. Omega Durante, M.D.   Electrophysiology/Cardiology   [facility]   [address]                          [place], [place] [id number]                              [phone]

## 2020-06-21 ENCOUNTER — Ambulatory Visit: Primary: Family

## 2020-06-21 ENCOUNTER — Ambulatory Visit: Admit: 2020-06-21 | Discharge: 2020-06-21 | Payer: MEDICARE | Primary: Family

## 2020-06-21 DIAGNOSIS — Z950 Presence of cardiac pacemaker: Secondary | ICD-10-CM

## 2020-06-21 NOTE — Progress Notes (Signed)
chargeable Q3M dc bs pm check due to older device    Normal device function with 68% RA & 33% RV pacing & no recorded events.  Device has 2 yrs remaining longevity.    See scanned report in Careers information officer for details.

## 2020-07-15 LAB — AMB EXT HGBA1C
Hemoglobin A1C, External: 6.1 %
Hemoglobin A1c, External: 6.1 %

## 2020-07-20 NOTE — Telephone Encounter (Signed)
Telephone Encounter by Annitta Jersey, RN at 07/20/20 1343                Author: Annitta Jersey, RN  Service: --  Author Type: Registered Nurse       Filed: 07/20/20 1345  Encounter Date: 07/20/2020  Status: Signed          Editor: Annitta Jersey, RN (Registered Nurse)                 Costella Hatcher B, NP    You 7 minutes ago (1:32 PM)              Patient is on amiodarone for PVCs.  She can stop that & start Toprol XL 25 mg po daily.  BP occasionally trends low, so will start with low  dose.  Recommend holter monitor in approx 6 weeks to reassess PVC burden.                Attempted to reach patient by telephone. HIPAA compliant message was left for return call.

## 2020-07-20 NOTE — Telephone Encounter (Signed)
Patient PCP doctor Fuller Song told the patient to contact dr.Ngo to get an alternative medication for amiodarone 200 mg.    The reason the PCP is requesting a change is because the patient has gain 10 lbs in 2 months.She doesn't want the patient gaining weight.      [phone]

## 2020-07-20 NOTE — Telephone Encounter (Signed)
Patient is on amiodarone for PVCs.  She can stop that & start Toprol XL 25 mg po daily.  BP occasionally trends low, so will start with low dose.  Recommend holter monitor in approx 6 weeks to reassess PVC burden.

## 2020-07-21 NOTE — Telephone Encounter (Signed)
OK to increase Toprol XL to 50 mg po daily since she's stopping amiodarone.

## 2020-07-21 NOTE — Telephone Encounter (Signed)
Patient is rtc back to the nurse.    [phone]

## 2020-07-21 NOTE — Telephone Encounter (Signed)
Returned call, no answer. LM on VM to have call returned to [phone].Need to inform patient to stop Amiodarone, start Toprol XL 25 mg daily monitor BP, and get 24 hr HM in 6 weeks per M. Lahoma Rocker NP.

## 2020-07-21 NOTE — Telephone Encounter (Signed)
Received return call from patient, ID verified using two patient identifiers. Informed patient of recommendations from M. Lahoma Rocker NP to stop Amiodarone (per PCP's request),  start Toprol XL 25 mg daily and have 24 hr Holter monitor in 6 weeks. Patient states she is already taking Metoprolol once a day. Advised her that Metoprolol was not on her med list. Looks like her PCP ordered the Metoprolol in April. Advised patient that I would update her med list and relay information to M. Lahoma Rocker NP for new recommendations. Patient agreeable to this plan.   24 hr Holter monitor scheduled for Friday 09/08/20 @ 11 am. Patient aware of date, time and location of appointment.

## 2020-07-21 NOTE — Telephone Encounter (Signed)
Patient returning phone call.     [phone]

## 2020-07-21 NOTE — Telephone Encounter (Signed)
 Called patient, no answer. LM on VM to have call returned to [phone]. Per M. Burnis NP OK to increase Toprol  XL to 50 mg po daily since she's stopping amiodarone . Will update med list once patient has been notified.

## 2020-07-24 NOTE — Telephone Encounter (Signed)
 Called patient, no answer. LM on VM to have call returned to [phone]. Per M. Burnis NP OK to increase Toprol  XL to 50 mg po daily since she's stopping amiodarone . Will update med list once patient has been notified.

## 2020-07-25 MED ORDER — METOPROLOL SUCCINATE SR 50 MG 24 HR TAB
50 mg | ORAL_TABLET | Freq: Every day | ORAL | 3 refills | Status: DC
Start: 2020-07-25 — End: 2020-10-27

## 2020-07-25 NOTE — Progress Notes (Signed)
Received labs dated 07/14/2020.    TSH 0.466  Free T4 2.26    WBC 5.4  Hgb 13.7  Hct 41.8  Plt 85    Glu 92  BUN 17  Cr 1.06  Na 143  K 4.8    Hgb A1c 6.1

## 2020-07-25 NOTE — Telephone Encounter (Signed)
 Requested Prescriptions     Signed Prescriptions Disp Refills   . metoprolol  succinate (TOPROL -XL) 50 mg XL tablet 30 Tablet 3     Sig: Take 1 Tablet by mouth daily.     Authorizing Provider: BURNIS HOY NOVAK     Ordering User: LIAM MONTEL RAMAN     New prescription for increased dose sent to pharmacy per verbal order M. BURNIS NP. Called patient, call goes straight to VM. VM identifies patient by name. Message left to have patient increase her Metoprolol  Succ(Toprol  XL) to 50 mg daily since she is stopping her Amiodarone . Informed patient that [facility]. Left call back # [phone] for any questions.

## 2020-08-26 ENCOUNTER — Emergency Department: Admit: 2020-08-26 | Payer: MEDICARE | Primary: Family

## 2020-08-26 ENCOUNTER — Inpatient Hospital Stay
Admit: 2020-08-26 | Discharge: 2020-08-26 | Disposition: A | Discharge status: Home / Self Care | Payer: MEDICARE | Attending: Emergency Medicine

## 2020-08-26 DIAGNOSIS — R509 Fever, unspecified: Secondary | ICD-10-CM

## 2020-08-26 LAB — CBC WITH AUTOMATED DIFF
ABS. BASOPHILS: 0.1 10*3/uL (ref 0.0–0.1)
ABS. EOSINOPHILS: 0.2 10*3/uL (ref 0.0–0.4)
ABS. IMM. GRANS.: 0 10*3/uL (ref 0.00–0.04)
ABS. LYMPHOCYTES: 2.1 10*3/uL (ref 0.8–3.5)
ABS. MONOCYTES: 0.4 10*3/uL (ref 0.0–1.0)
ABS. NEUTROPHILS: 2.6 10*3/uL (ref 1.8–8.0)
ABSOLUTE NRBC: 0 10*3/uL (ref 0.00–0.01)
BASOPHILS: 1 % (ref 0–1)
EOSINOPHILS: 3 % (ref 0–7)
HCT: 39.2 % (ref 35.0–47.0)
HGB: 12.7 g/dL (ref 11.5–16.0)
IMMATURE GRANULOCYTES: 0 % (ref 0.0–0.5)
LYMPHOCYTES: 39 % (ref 12–49)
MCH: 31.3 PG (ref 26.0–34.0)
MCHC: 32.4 g/dL (ref 30.0–36.5)
MCV: 96.6 FL (ref 80.0–99.0)
MONOCYTES: 7 % (ref 5–13)
MPV: 10.5 FL (ref 8.9–12.9)
NEUTROPHILS: 50 % (ref 32–75)
NRBC: 0 PER 100 WBC
PLATELET: 77 10*3/uL — ABNORMAL LOW (ref 150–400)
RBC: 4.06 M/uL (ref 3.80–5.20)
RDW: 13.3 % (ref 11.5–14.5)
WBC: 5.4 10*3/uL (ref 3.6–11.0)

## 2020-08-26 LAB — URINALYSIS W/ REFLEX CULTURE
Bilirubin, Urine: NEGATIVE
Bilirubin: NEGATIVE
Glucose, Ur: NEGATIVE mg/dL
Glucose: NEGATIVE mg/dL
Nitrite, Urine: NEGATIVE
Nitrites: NEGATIVE
Protein, UA: 100 mg/dL — AB
Protein: 100 mg/dL — AB
RBC, UA: >100 /hpf — ABNORMAL HIGH (ref 0–5)
RBC: >100 /hpf — ABNORMAL HIGH (ref 0–5)
Specific Gravity, UA: 1.024 (ref 1.003–1.030)
Specific gravity: 1.024 (ref 1.003–1.030)
Urobilinogen, UA, POCT: 2 EU/dL — ABNORMAL HIGH (ref 0.2–1.0)
Urobilinogen: 2 EU/dL — ABNORMAL HIGH (ref 0.2–1.0)
pH (UA): 6.5 (ref 5.0–8.0)
pH, UA: 6.5 (ref 5.0–8.0)

## 2020-08-26 LAB — METABOLIC PANEL, COMPREHENSIVE
A-G Ratio: 1.1 (ref 1.1–2.2)
ALT (SGPT): 16 U/L (ref 12–78)
AST (SGOT): 15 U/L (ref 15–37)
Albumin: 3.6 g/dL (ref 3.5–5.0)
Alk. phosphatase: 54 U/L (ref 45–117)
Anion gap: 2 mmol/L — ABNORMAL LOW (ref 5–15)
BUN/Creatinine ratio: 18 (ref 12–20)
BUN: 22 MG/DL — ABNORMAL HIGH (ref 6–20)
Bilirubin, total: 1.7 MG/DL — ABNORMAL HIGH (ref 0.2–1.0)
CO2: 30 mmol/L (ref 21–32)
Calcium: 8.7 MG/DL (ref 8.5–10.1)
Chloride: 111 mmol/L — ABNORMAL HIGH (ref 97–108)
Creatinine: 1.19 MG/DL — ABNORMAL HIGH (ref 0.55–1.02)
GFR est AA: 56 mL/min/{1.73_m2} — ABNORMAL LOW (ref >60)
GFR est non-AA: 46 mL/min/{1.73_m2} — ABNORMAL LOW (ref >60)
Globulin: 3.2 g/dL (ref 2.0–4.0)
Glucose: 112 mg/dL — ABNORMAL HIGH (ref 65–100)
Potassium: 4.8 mmol/L (ref 3.5–5.1)
Protein, total: 6.8 g/dL (ref 6.4–8.2)
Sodium: 143 mmol/L (ref 136–145)

## 2020-08-26 LAB — C REACTIVE PROTEIN, QT: C-Reactive protein: <0.29 mg/dL (ref 0.00–0.60)

## 2020-08-26 LAB — TROPONIN-HIGH SENSITIVITY: Troponin-High Sensitivity: 7 ng/L (ref 0–51)

## 2020-08-26 LAB — PROCALCITONIN
Procalcitonin: <0.05 ng/mL
Procalcitonin: <0.05 ng/mL

## 2020-08-26 LAB — SAMPLES BEING HELD

## 2020-08-26 LAB — COVID-19 RAPID TEST: COVID-19 rapid test: NOT DETECTED

## 2020-08-26 LAB — CBC WITH AUTO DIFFERENTIAL
Basophils %: 1 % (ref 0–1)
Basophils Absolute: 0.1 10*3/uL (ref 0.0–0.1)
Eosinophils %: 3 % (ref 0–7)
Eosinophils Absolute: 0.2 10*3/uL (ref 0.0–0.4)
Granulocyte Absolute Count: 0 10*3/uL (ref 0.00–0.04)
Hematocrit: 39.2 % (ref 35.0–47.0)
Hemoglobin: 12.7 g/dL (ref 11.5–16.0)
Immature Granulocytes: 0 % (ref 0.0–0.5)
Lymphocytes %: 39 % (ref 12–49)
Lymphocytes Absolute: 2.1 10*3/uL (ref 0.8–3.5)
MCH: 31.3 PG (ref 26.0–34.0)
MCHC: 32.4 g/dL (ref 30.0–36.5)
MCV: 96.6 FL (ref 80.0–99.0)
MPV: 10.5 FL (ref 8.9–12.9)
Monocytes %: 7 % (ref 5–13)
Monocytes Absolute: 0.4 10*3/uL (ref 0.0–1.0)
NRBC Absolute: 0 10*3/uL (ref 0.00–0.01)
Neutrophils %: 50 % (ref 32–75)
Neutrophils Absolute: 2.6 10*3/uL (ref 1.8–8.0)
Nucleated RBCs: 0 PER 100 WBC
Platelets: 77 10*3/uL — ABNORMAL LOW (ref 150–400)
RBC: 4.06 M/uL (ref 3.80–5.20)
RDW: 13.3 % (ref 11.5–14.5)
WBC: 5.4 10*3/uL (ref 3.6–11.0)

## 2020-08-26 LAB — COMPREHENSIVE METABOLIC PANEL
ALT: 16 U/L (ref 12–78)
AST: 15 U/L (ref 15–37)
Albumin/Globulin Ratio: 1.1 (ref 1.1–2.2)
Albumin: 3.6 g/dL (ref 3.5–5.0)
Alkaline Phosphatase: 54 U/L (ref 45–117)
Anion Gap: 2 mmol/L — ABNORMAL LOW (ref 5–15)
BUN: 22 MG/DL — ABNORMAL HIGH (ref 6–20)
Bun/Cre Ratio: 18 (ref 12–20)
CO2: 30 mmol/L (ref 21–32)
Calcium: 8.7 MG/DL (ref 8.5–10.1)
Chloride: 111 mmol/L — ABNORMAL HIGH (ref 97–108)
Creatinine: 1.19 MG/DL — ABNORMAL HIGH (ref 0.55–1.02)
EGFR IF NonAfrican American: 46 mL/min/{1.73_m2} — ABNORMAL LOW (ref >60)
GFR African American: 56 mL/min/{1.73_m2} — ABNORMAL LOW (ref >60)
Globulin: 3.2 g/dL (ref 2.0–4.0)
Glucose: 112 mg/dL — ABNORMAL HIGH (ref 65–100)
Potassium: 4.8 mmol/L (ref 3.5–5.1)
Sodium: 143 mmol/L (ref 136–145)
Total Bilirubin: 1.7 MG/DL — ABNORMAL HIGH (ref 0.2–1.0)
Total Protein: 6.8 g/dL (ref 6.4–8.2)

## 2020-08-26 LAB — C-REACTIVE PROTEIN: CRP: <0.29 mg/dL (ref 0.00–0.60)

## 2020-08-26 LAB — COVID-19, RAPID: SARS-CoV-2, Rapid: NOT DETECTED

## 2020-08-26 LAB — TROPONIN, HIGH SENSITIVITY: Troponin, High Sensitivity: 7 ng/L (ref 0–51)

## 2020-08-26 MED ORDER — IOPAMIDOL 76 % IV SOLN
76 % | Freq: Once | INTRAVENOUS | Status: AC
Start: 2020-08-26 — End: 2020-08-26
  Administered 2020-08-26: 22:00:00 via INTRAVENOUS

## 2020-08-26 MED FILL — ISOVUE-370  76 % INTRAVENOUS SOLUTION: 370 mg iodine /mL (76 %) | INTRAVENOUS | Qty: 100

## 2020-08-26 NOTE — ED Notes (Signed)
Discharged by provider. Pt given pulse oximetry and educated on use. Pt leaves via wheelchair in no acute distress. Denies questions or concerns.

## 2020-08-26 NOTE — ED Notes (Signed)
Pt reports exposure to covid on 08/22/20 symptoms of SOB, chills and headache starting 2 days ago.     Pt also reports 3 days of right sided flank pain with blood in urine.    Reports chest pressure.       Denies fevers, nausea and vomiting.     Denies urinary frequency urgency or burning.

## 2020-08-26 NOTE — ED Provider Notes (Signed)
65 year old female history of asthma, chronic narcotic use, chronic pain, CKD, GERD, morbid obesity who is status post pacemaker placement presents to the emergency department chief complaint of chest pain, fever, cough, myalgias.  Symptoms have been present for 4 to 5 days.  She has positive COVID exposure about a week ago.  Also complains of hematuria for the last several days with bilateral flank pain.  History of kidney stones.    The history is provided by the patient and medical records.   Shortness of Breath  This is a new problem. The problem occurs continuously.The problem has been gradually worsening. Associated symptoms include a fever, headaches, cough and chest pain. Pertinent negatives include no sore throat, no vomiting, no abdominal pain, no rash and no leg swelling. Associated medical issues include asthma.      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      No family history on file.    Social History     Socioeconomic History    Marital status: SINGLE     Spouse name: Not on file    Number of children: Not on file    Years of education: Not on file    Highest education level: Not on file   Occupational History    Not on file   Tobacco Use    Smoking status: Former     Packs/day: 1.00     Years: 20.00     Pack years: 20.00     Types: Cigarettes     Quit date: 03/21/2010     Years since quitting: 10.4    Smokeless tobacco: Never   Substance and Sexual Activity    Alcohol use: Not Currently    Drug use: Not on file    Sexual activity: Not on file   Other Topics Concern    Not on file   Social History Narrative    Not on file      Social Determinants of Health     Financial Resource Strain: Not on file   Food Insecurity: Not on file   Transportation Needs: Not on file   Physical Activity: Not on file   Stress: Not on file   Social Connections: Not on file   Intimate Partner Violence: Not on file   Housing Stability: Not on file         ALLERGIES: Nitroglycerin, Aloe vera, and Hydrochlorothiazide    Review of Systems   Constitutional:  Positive for fatigue and fever.   HENT:  Negative for sneezing and sore throat.    Respiratory:  Positive for cough and shortness of breath.    Cardiovascular:  Positive for chest pain. Negative for leg swelling.   Gastrointestinal:  Negative for abdominal pain, diarrhea, nausea and vomiting.   Genitourinary:  Positive for flank pain and hematuria. Negative for difficulty urinating and dysuria.   Musculoskeletal:  Negative  for arthralgias and myalgias.   Skin:  Negative for color change and rash.   Neurological:  Positive for headaches. Negative for weakness.   Psychiatric/Behavioral:  Negative for agitation and behavioral problems.      Vitals:    08/26/20 1606   BP: (!) 115/51   Pulse: (!) 41   Resp: 16   Temp: 98.2 ??F (36.8 ??C)   SpO2: 95%   Weight: 152.4 kg (336 lb)   Height: 5\' 7"  (1.702 m)            Physical Exam  Vitals and nursing note reviewed.   Constitutional:       General: She is not in acute distress.     Appearance: She is well-developed. She is obese. She is ill-appearing. She is not toxic-appearing or diaphoretic.   HENT:      Head: Normocephalic and atraumatic.      Nose: Nose normal.      Mouth/Throat:      Mouth: Mucous membranes are moist.      Pharynx: Oropharynx is clear.   Eyes:      Extraocular Movements: Extraocular movements intact.      Conjunctiva/sclera: Conjunctivae normal.      Pupils: Pupils are equal, round, and reactive to light.   Cardiovascular:      Rate and Rhythm: Regular rhythm. Bradycardia present.      Pulses: Normal pulses.      Heart sounds: Normal heart sounds.    Pulmonary:      Effort: Pulmonary effort is normal. No respiratory distress.      Breath sounds: Normal breath sounds. No wheezing.   Chest:      Chest wall: No tenderness.   Abdominal:      General: Abdomen is flat. There is no distension.      Palpations: Abdomen is soft.      Tenderness: There is no abdominal tenderness. There is no guarding or rebound.   Musculoskeletal:         General: No swelling, tenderness, deformity or signs of injury. Normal range of motion.      Cervical back: Normal range of motion and neck supple. No rigidity. No muscular tenderness.      Right lower leg: No edema.      Left lower leg: No edema.   Skin:     General: Skin is warm and dry.      Capillary Refill: Capillary refill takes less than 2 seconds.   Neurological:      General: No focal deficit present.      Mental Status: She is alert and oriented to person, place, and time.   Psychiatric:         Mood and Affect: Mood normal.         Behavior: Behavior normal.        MDM  Number of Diagnoses or Management Options  Diagnosis management comments: 65 year old female presents as above with generalized fatigue with URI symptoms along with does not have evidence of UTI but does have hematuria.  CT scan shows multiple nonemergent findings including bilateral renal stones without ureteral lithiasis.  Her rapid COVID is negative in the emergency department along with negative CRP and procalcitonin.  I suspect other viral syndrome but will send PCR COVID.  No indications for hospitalization at this time.  She will need to follow-up with urology regarding hematuria if it does not clear on its own.       Amount and/or Complexity  of Data Reviewed  Clinical lab tests: reviewed  Tests in the radiology section of CPT??: reviewed  Decide to obtain previous medical records or to obtain history from someone other than the patient: yes      ED Course as of 08/26/20 1822   Sat Aug 26, 2020   1558   ED EKG interpretation:  Rhythm: Sinus rhythm with  atrial pacing and left axis deviation with frequent PVCs resulting in bigeminy.  Rate approximately 80.  Axis: Left.  ST segment:  No concerning ST elevations or depressions. This EKG was interpreted by Louann Sjogren, MD,ED Provider. [JM]      ED Course User Index  [JM] Terrall Laity, MD       Procedures

## 2020-08-28 LAB — SARS-COV-2: SARS-CoV-2: NOT DETECTED

## 2020-08-28 LAB — COVID-19: SARS-CoV-2: NOT DETECTED

## 2020-08-29 LAB — EKG, 12 LEAD, INITIAL
Atrial Rate: 80 {beats}/min
Calculated P Axis: 113 degrees
Calculated R Axis: -52 degrees
Calculated T Axis: 172 degrees
P-R Interval: 216 ms
Q-T Interval: 370 ms
QRS Duration: 152 ms
QTC Calculation (Bezet): 426 ms
Ventricular Rate: 80 {beats}/min

## 2020-08-29 LAB — EKG 12-LEAD
Atrial Rate: 80 {beats}/min
P Axis: 113 degrees
P-R Interval: 216 ms
Q-T Interval: 370 ms
QRS Duration: 152 ms
QTc Calculation (Bazett): 426 ms
R Axis: -52 degrees
T Axis: 172 degrees
Ventricular Rate: 80 {beats}/min

## 2020-09-08 ENCOUNTER — Encounter: Primary: Family

## 2020-09-11 NOTE — Telephone Encounter (Signed)
[id number]. Per M. Lahoma Rocker NP's note on 07/20/20 "OK to increase Toprol XL to 50 mg po daily since she's stopping amiodarone." Refill request refused and Amiodarone removed from patient's med list.

## 2020-09-13 ENCOUNTER — Institutional Professional Consult (permissible substitution): Admit: 2020-09-13 | Payer: MEDICARE | Primary: Family

## 2020-09-13 DIAGNOSIS — I493 Ventricular premature depolarization: Secondary | ICD-10-CM

## 2020-09-13 NOTE — Progress Notes (Signed)
Applied 24 hr holter per Costella Hatcher, NP dx: PVC.  Pt has #[id number]   Chargeable visit.  [employer]

## 2020-09-14 NOTE — Telephone Encounter (Signed)
LM for pt to call me sll

## 2020-09-25 NOTE — Progress Notes (Signed)
Holter 08/2020 > 30 thousands PVC, 10 beat NSVT, brief PAT  This information had been known on previous loop recorder    BMI 51    Recommend 2 day lexiscan cardiolite stress test  Continue toprol    Follow up with Dr Lorella Nimrod if she still plans to change cardiology to [facility]      Future Appointments   Date Time Provider Department Center   11/15/2020  9:00 AM [facility]AK[facility]

## 2020-09-27 ENCOUNTER — Encounter: Primary: Family

## 2020-09-27 NOTE — Progress Notes (Signed)
Holter report received and signed by provider. Report sent to scanning.

## 2020-09-28 ENCOUNTER — Telehealth

## 2020-09-28 NOTE — Telephone Encounter (Signed)
Thurston Pounds, MD 3 days ago     Holter 08/2020 > 30 thousands PVC, 10 beat NSVT, brief PAT  This information had been known on previous loop recorder     BMI 51     Recommend 2 day lexiscan cardiolite stress test  Continue toprol     Follow up with Dr Lorella Nimrod if she still plans to change cardiology to [facility]        Attempted to reach patient by telephone. HIPAA compliant message was left for return call.

## 2020-09-28 NOTE — Telephone Encounter (Signed)
Patient called to return call. Patient said if no answer due to spotty service you can leave the results via voicemail or state to call back     Patent call back: # [phone]

## 2020-09-28 NOTE — Telephone Encounter (Signed)
Patient returned your call          Phone:[phone]

## 2020-09-28 NOTE — Telephone Encounter (Signed)
[name], Deborah 54 minutes ago (12:50 PM)     Patient called to return call. Patient said if no answer due to spotty service you can leave the results via voicemail or state to call back      Patent call back: # [phone]        Thurston Pounds, MD 3 days ago      Holter 08/2020 > 30 thousands PVC, 10 beat NSVT, brief PAT  This information had been known on previous loop recorder     BMI 51     Recommend 2 day lexiscan cardiolite stress test  Continue toprol     Follow up with Dr Lorella Nimrod if she still plans to change cardiology to [facility]        Attempted to reach patient by telephone. Patient identifies self on voicemail.  HIPAA compliant message was left as requested notifying of above results and recommendations.    Advised patient that I will have a scheduler reach out to her either by phone or mychart to schedule the stress test.

## 2020-09-28 NOTE — Telephone Encounter (Signed)
Returned patient call, ID verified using two patient identifiers.  Notified patient of holter monitor results and Dr. Wyatt Haste recommendations for a lexiscan stress test.    Patient agreeable and will wait for the scheduler to call her.  She states she is unsure if she wants to establish with Dr. Lorella Nimrod.  She will let us know when she decides.

## 2020-09-28 NOTE — Telephone Encounter (Signed)
Replied under separate open phone encounter.

## 2020-09-29 NOTE — Telephone Encounter (Signed)
LM for pt to call and schedule a 2 day lexiscan  here at [facility] office sll

## 2020-10-27 MED ORDER — METOPROLOL SUCCINATE SR 50 MG 24 HR TAB
50 mg | ORAL_TABLET | ORAL | 1 refills | Status: DC
Start: 2020-10-27 — End: 2021-01-26

## 2020-10-27 NOTE — Telephone Encounter (Signed)
Requested Prescriptions     Signed Prescriptions Disp Refills    metoprolol succinate (TOPROL-XL) 50 mg XL tablet 90 Tablet 1     Sig: TAKE 1 TABLET BY MOUTH EVERY DAY     Authorizing Provider: French Ana     Ordering User: TENCH, PATSY S     Refills per verbal order from M. Lahoma Rocker NP.   Last visit: 03/21/20 (Dr. Loma Newton)  Next visit: to be scheduled.  Message sent to scheduler to contact the patient to schedule a follow up.

## 2020-11-05 ENCOUNTER — Emergency Department: Admit: 2020-11-05 | Payer: MEDICARE | Primary: Family

## 2020-11-05 ENCOUNTER — Inpatient Hospital Stay
Admit: 2020-11-05 | Discharge: 2020-11-05 | Disposition: A | Discharge status: Home / Self Care | Payer: MEDICARE | Attending: Emergency Medicine

## 2020-11-05 DIAGNOSIS — M791 Myalgia, unspecified site: Secondary | ICD-10-CM

## 2020-11-05 LAB — METABOLIC PANEL, COMPREHENSIVE
A-G Ratio: 1 — ABNORMAL LOW (ref 1.1–2.2)
ALT (SGPT): 13 U/L (ref 12–78)
AST (SGOT): 14 U/L — ABNORMAL LOW (ref 15–37)
Albumin: 3.4 g/dL — ABNORMAL LOW (ref 3.5–5.0)
Alk. phosphatase: 60 U/L (ref 45–117)
Anion gap: 2 mmol/L — ABNORMAL LOW (ref 5–15)
BUN/Creatinine ratio: 20 (ref 12–20)
BUN: 26 MG/DL — ABNORMAL HIGH (ref 6–20)
Bilirubin, total: 2 MG/DL — ABNORMAL HIGH (ref 0.2–1.0)
CO2: 32 mmol/L (ref 21–32)
Calcium: 8.3 MG/DL — ABNORMAL LOW (ref 8.5–10.1)
Chloride: 104 mmol/L (ref 97–108)
Creatinine: 1.3 MG/DL — ABNORMAL HIGH (ref 0.55–1.02)
Globulin: 3.4 g/dL (ref 2.0–4.0)
Glucose: 155 mg/dL — ABNORMAL HIGH (ref 65–100)
Potassium: 4.2 mmol/L (ref 3.5–5.1)
Protein, total: 6.8 g/dL (ref 6.4–8.2)
Sodium: 138 mmol/L (ref 136–145)
eGFR: 46 mL/min/{1.73_m2} — ABNORMAL LOW (ref >60)

## 2020-11-05 LAB — CBC WITH AUTOMATED DIFF
ABS. BASOPHILS: 0 10*3/uL (ref 0.0–0.1)
ABS. EOSINOPHILS: 0.2 10*3/uL (ref 0.0–0.4)
ABS. IMM. GRANS.: 0 10*3/uL (ref 0.00–0.04)
ABS. LYMPHOCYTES: 1.3 10*3/uL (ref 0.8–3.5)
ABS. MONOCYTES: 0.2 10*3/uL (ref 0.0–1.0)
ABS. NEUTROPHILS: 2.2 10*3/uL (ref 1.8–8.0)
ABSOLUTE NRBC: 0 10*3/uL (ref 0.00–0.01)
BASOPHILS: 1 % (ref 0–1)
EOSINOPHILS: 4 % (ref 0–7)
HCT: 37.2 % (ref 35.0–47.0)
HGB: 11.9 g/dL (ref 11.5–16.0)
IMMATURE GRANULOCYTES: 1 % — ABNORMAL HIGH (ref 0–0.5)
LYMPHOCYTES: 34 % (ref 12–49)
MCH: 30.4 PG (ref 26.0–34.0)
MCHC: 32 g/dL (ref 30.0–36.5)
MCV: 95.1 FL (ref 80.0–99.0)
MONOCYTES: 6 % (ref 5–13)
MPV: 11.2 FL (ref 8.9–12.9)
NEUTROPHILS: 56 % (ref 32–75)
NRBC: 0 PER 100 WBC
PLATELET: 71 10*3/uL — ABNORMAL LOW (ref 150–400)
RBC: 3.91 M/uL (ref 3.80–5.20)
RDW: 13.8 % (ref 11.5–14.5)
WBC: 3.9 10*3/uL (ref 3.6–11.0)

## 2020-11-05 LAB — NT-PRO BNP: NT pro-BNP: 1425 PG/ML — ABNORMAL HIGH (ref 0–125)

## 2020-11-05 LAB — TROPONIN-HIGH SENSITIVITY: Troponin-High Sensitivity: 10 ng/L (ref 0–51)

## 2020-11-05 LAB — CBC WITH AUTO DIFFERENTIAL
Basophils %: 1 % (ref 0–1)
Basophils Absolute: 0 10*3/uL (ref 0.0–0.1)
Eosinophils %: 4 % (ref 0–7)
Eosinophils Absolute: 0.2 10*3/uL (ref 0.0–0.4)
Granulocyte Absolute Count: 0 10*3/uL (ref 0.00–0.04)
Hematocrit: 37.2 % (ref 35.0–47.0)
Hemoglobin: 11.9 g/dL (ref 11.5–16.0)
Immature Granulocytes: 1 % — ABNORMAL HIGH (ref 0–0.5)
Lymphocytes %: 34 % (ref 12–49)
Lymphocytes Absolute: 1.3 10*3/uL (ref 0.8–3.5)
MCH: 30.4 PG (ref 26.0–34.0)
MCHC: 32 g/dL (ref 30.0–36.5)
MCV: 95.1 FL (ref 80.0–99.0)
MPV: 11.2 FL (ref 8.9–12.9)
Monocytes %: 6 % (ref 5–13)
Monocytes Absolute: 0.2 10*3/uL (ref 0.0–1.0)
NRBC Absolute: 0 10*3/uL (ref 0.00–0.01)
Neutrophils %: 56 % (ref 32–75)
Neutrophils Absolute: 2.2 10*3/uL (ref 1.8–8.0)
Nucleated RBCs: 0 PER 100 WBC
Platelets: 71 10*3/uL — ABNORMAL LOW (ref 150–400)
RBC: 3.91 M/uL (ref 3.80–5.20)
RDW: 13.8 % (ref 11.5–14.5)
WBC: 3.9 10*3/uL (ref 3.6–11.0)

## 2020-11-05 LAB — COMPREHENSIVE METABOLIC PANEL
ALT: 13 U/L (ref 12–78)
AST: 14 U/L — ABNORMAL LOW (ref 15–37)
Albumin/Globulin Ratio: 1 — ABNORMAL LOW (ref 1.1–2.2)
Albumin: 3.4 g/dL — ABNORMAL LOW (ref 3.5–5.0)
Alkaline Phosphatase: 60 U/L (ref 45–117)
Anion Gap: 2 mmol/L — ABNORMAL LOW (ref 5–15)
BUN: 26 MG/DL — ABNORMAL HIGH (ref 6–20)
Bun/Cre Ratio: 20 (ref 12–20)
CO2: 32 mmol/L (ref 21–32)
Calcium: 8.3 MG/DL — ABNORMAL LOW (ref 8.5–10.1)
Chloride: 104 mmol/L (ref 97–108)
Creatinine: 1.3 MG/DL — ABNORMAL HIGH (ref 0.55–1.02)
ESTIMATED GLOMERULAR FILTRATION RATE: 46 mL/min/{1.73_m2} — ABNORMAL LOW (ref >60)
Globulin: 3.4 g/dL (ref 2.0–4.0)
Glucose: 155 mg/dL — ABNORMAL HIGH (ref 65–100)
Potassium: 4.2 mmol/L (ref 3.5–5.1)
Sodium: 138 mmol/L (ref 136–145)
Total Bilirubin: 2 MG/DL — ABNORMAL HIGH (ref 0.2–1.0)
Total Protein: 6.8 g/dL (ref 6.4–8.2)

## 2020-11-05 LAB — TROPONIN, HIGH SENSITIVITY: Troponin, High Sensitivity: 10 ng/L (ref 0–51)

## 2020-11-05 LAB — PROBNP, N-TERMINAL: BNP: 1425 PG/ML — ABNORMAL HIGH (ref 0–125)

## 2020-11-05 MED ORDER — LIDOCAINE 4 % TOPICAL PATCH (12 HOUR DURATION)
4 % | CUTANEOUS | Status: DC
Start: 2020-11-05 — End: 2020-11-05

## 2020-11-05 MED ORDER — LIDOCAINE 5 % (700 MG/PATCH) ADHESIVE PATCH
5 % | CUTANEOUS | 0 refills | Status: DC
Start: 2020-11-05 — End: 2021-04-24

## 2020-11-05 MED FILL — LIDOCAINE 4 % TOPICAL PATCH (12 HOUR DURATION): 4 % | CUTANEOUS | Qty: 1

## 2020-11-05 NOTE — ED Notes (Signed)
ED Notes by Tempie Donning, RN at 11/05/20 1340                Author: Tempie Donning, RN  Service: --  Author Type: Registered Nurse       Filed: 11/05/20 1348  Date of Service: 11/05/20 1340  Status: Signed          Editor: Tempie Donning, RN (Registered Nurse)               Pt requested ginger ale okayed by Dr Yetta Barre.

## 2020-11-05 NOTE — ED Notes (Signed)
 ED Triage Notes by Wilfred Peters, RN at 11/05/20 1306                Author: Wilfred Peters, RN  Service: --  Author Type: Registered Nurse       Filed: 11/05/20 1312  Date of Service: 11/05/20 1306  Status: Signed          Editor: Wilfred Peters, RN (Registered Nurse)               Pt was assisted from her car and wheeled to the treatment area accompanied by her daughter. Pt states I started a headache last night about 8pm I sat on the couch and stayed there  all night. When I woke up at 11am the headache was gone but I had right elbow pain armpit pain shoulder pain and it feels stiff. I got up to go to the bathroom and noticed I felt short of breath. My lower back has also been burning I figured that was  because of the way I was sitting all night. Pt appears in no distress.

## 2020-11-05 NOTE — ED Notes (Signed)
ED Notes by Tempie Donning, RN at 11/05/20 1500                Author: Tempie Donning, RN  Service: --  Author Type: Registered Nurse       Filed: 11/05/20 1507  Date of Service: 11/05/20 1500  Status: Signed          Editor: Tempie Donning, RN (Registered Nurse)               Pt was discharged and given instructions by Dr Yetta Barre. Pt and daughter verbalized good understanding of all discharge instructions,prescriptions and F/U care. All questions answered.  Pt in stable condition on discharge. Pt wheeled out to daughters car.

## 2020-11-05 NOTE — ED Provider Notes (Signed)
ED Provider Notes by Francoise Ceo, DO at 11/05/20 1315                Author: Francoise Ceo, DO  Service: Emergency Medicine  Author Type: Physician       Filed: 11/06/20 1400  Date of Service: 11/05/20 1315  Status: Signed          Editor: Francoise Ceo, DO (Physician)               65 year old female with a history of chronic pain, fibromyalgia, on chronic opiate medications, morbid obesity, DM,  CKD stage III, status post pacemaker placement and AVR, presents to the emergency department stating that last night she had a headache at about 8 PM so she sat down on the couch and fell asleep.  She states that when she awoke at about 11 AM she no longer  had a headache that she now had right arm pain and right shoulder pain predominantly around the trapezius area stating that it feels stiff.  She notes chronic shortness of breath and she also notes low back burning type pain that she "figured was because  of the way she was sitting all night."  She denies any falls or trauma to the area.  She notes some chronic shortness of breath and mild chest tightness but states that this is not significantly worse than it normally is.  No cough, fever, or other URI  symptoms, numbness, weakness or any other medical concerns.         Arm Pain     Associated symptoms include back pain and neck pain. Pertinent negatives include no numbness.    Back Pain     Pertinent negatives include no chest pain, no fever, no numbness, no headaches, no abdominal pain, no dysuria and no weakness.    Shortness of Breath   Associated symptoms include neck pain. Pertinent negatives include no fever, no headaches, no rhinorrhea, no sore throat, no cough, no chest pain, no vomiting, no abdominal pain  and no rash.          Past Medical History:        Diagnosis  Date         ?  Anxiety and depression       ?  Aortic valve replaced       ?  Asthma       ?  Chronic narcotic use       ?  Chronic pain       ?  CKD (chronic kidney disease),  stage III (HCC)       ?  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  GERD (gastroesophageal reflux disease)       ?  Hyperlipidemia       ?  Hypothyroid       ?  Morbid obesity (HCC)       ?  Neuropathy       ?  Pacemaker           ?  Rhinitis             History reviewed. No pertinent surgical history.        History reviewed. No pertinent family history.        Social History          Socioeconomic History         ?  Marital status:  SINGLE  Spouse name:  Not on file         ?  Number of children:  Not on file     ?  Years of education:  Not on file     ?  Highest education level:  Not on file       Occupational History        ?  Not on file       Tobacco Use         ?  Smoking status:  Former              Packs/day:  1.00         Years:  20.00         Pack years:  20.00         Types:  Cigarettes         Quit date:  03/21/2010         Years since quitting:  10.6         ?  Smokeless tobacco:  Never       Vaping Use         ?  Vaping Use:  Never used       Substance and Sexual Activity         ?  Alcohol use:  Not Currently     ?  Drug use:  Never     ?  Sexual activity:  Not on file        Other Topics  Concern        ?  Not on file       Social History Narrative        ?  Not on file          Social Determinants of Health          Financial Resource Strain: Not on file     Food Insecurity: Not on file     Transportation Needs: Not on file     Physical Activity: Not on file     Stress: Not on file     Social Connections: Not on file     Intimate Partner Violence: Not on file       Housing Stability: Not on file              ALLERGIES: Nitroglycerin, Aloe vera, Hydrochlorothiazide, and Tetanus and diphther. tox (pf)      Review of Systems    Constitutional:  Negative for activity change, appetite change, chills and fever.    HENT:  Negative for congestion, rhinorrhea, sinus pressure, sneezing and sore throat.     Eyes:  Negative for photophobia and visual disturbance.    Respiratory:  Positive for  shortness of breath. Negative for cough.     Cardiovascular:  Negative for chest pain.    Gastrointestinal:  Negative for abdominal pain, blood in stool, constipation, diarrhea, nausea and vomiting.    Genitourinary:  Negative for difficulty urinating, dysuria, flank pain, frequency, hematuria, menstrual problem, urgency, vaginal bleeding and vaginal discharge.    Musculoskeletal:  Positive for back pain, myalgias , neck pain and neck stiffness.  Negative for arthralgias.    Skin:  Negative for rash and wound.    Neurological:  Negative for syncope, weakness, numbness and headaches.    Psychiatric/Behavioral:  Negative for self-injury and suicidal ideas.     All other systems reviewed and are negative.      There were no vitals filed for this visit.  Physical Exam   Vitals and nursing note reviewed.    Constitutional:        General: She is not in acute distress.      Appearance: Normal appearance. She is well-developed. She is obese. She is not diaphoretic.       Comments: Morbidly obese female sitting comfortably in no distress.  Speaking in full sentences.    HENT:       Head: Normocephalic and atraumatic.       Nose: Nose normal.    Eyes:       Extraocular Movements: Extraocular movements intact.       Conjunctiva/sclera: Conjunctivae normal.       Pupils: Pupils are equal, round, and reactive to light.     Cardiovascular:       Rate and Rhythm: Normal rate and regular rhythm.       Heart sounds: Normal heart sounds.    Pulmonary:       Effort: Pulmonary effort is normal.       Breath sounds: Normal breath sounds.     Abdominal:       General: There is no distension.       Palpations: Abdomen is soft.       Tenderness: There is no abdominal tenderness.     Musculoskeletal:          General: No tenderness.         Arms:           Cervical back: Neck supple.    Skin:      General: Skin is warm and dry.    Neurological:       General: No focal deficit present.       Mental Status: She is alert and oriented  to person, place, and time.       Cranial Nerves: No cranial nerve deficit.       Sensory: No sensory deficit.       Motor: No weakness.       Coordination: Coordination normal.           MDM      65 year old with history of chronic pain on chronic opiates, presents to the emergency department with muscular soreness after falling asleep in a chair last night and remaining in that position all night.  Very low suspicion for acute bony abnormalities.   She is well-appearing in no distress on exam.      Labs returned reassuringly showing no significant acute abnormalities, no leukocytosis or anemia, PLT 71, similar to prior, creatinine 1.3, similar to prior, T bili 2.0, similar to prior, negative troponin, and mildly elevated BNP at 1425      CXR viewed by myself and read by radiology showing no acute abnormalities.   .   Reassurance was given suspect muscular etiology due to positioning and sleeping wrong last night.  She was Rx lidocaine patches and was recommended to continue taking her normal pain medication regimen and PCP follow-up for further evaluation.  Return  precautions were given for worsening or concerns.  This plan was discussed with the patient at the bedside and she stated both understanding and agreement.      Please note that this dictation was completed with Dragon, the computer voice recognition software.  Quite often unanticipated grammatical, syntax, homophones, and other interpretive  errors are inadvertently transcribed by the computer software.  Please disregard these errors.  Please excuse any errors that have escaped final proofreading.  Procedures      1309 EKG shows atrial paced rhythm with frequent PVCs, left anterior fascicular block, rate of 67 bpm with no acute ST year-to-year abnormalities suggestive of ischemia.

## 2020-11-06 LAB — EKG, 12 LEAD, INITIAL
Atrial Rate: 67 {beats}/min
Calculated P Axis: 66 degrees
Calculated R Axis: -62 degrees
Calculated T Axis: 63 degrees
P-R Interval: 212 ms
Q-T Interval: 430 ms
QRS Duration: 116 ms
QTC Calculation (Bezet): 454 ms
Ventricular Rate: 67 {beats}/min

## 2020-11-06 LAB — EKG 12-LEAD
Atrial Rate: 67 {beats}/min
P Axis: 66 degrees
P-R Interval: 212 ms
Q-T Interval: 430 ms
QRS Duration: 116 ms
QTc Calculation (Bazett): 454 ms
R Axis: -62 degrees
T Axis: 63 degrees
Ventricular Rate: 67 {beats}/min

## 2020-11-15 ENCOUNTER — Ambulatory Visit: Primary: Family

## 2020-11-15 ENCOUNTER — Ambulatory Visit: Admit: 2020-11-15 | Discharge: 2020-11-15 | Payer: MEDICARE | Primary: Family

## 2020-11-15 DIAGNOSIS — Z950 Presence of cardiac pacemaker: Secondary | ICD-10-CM

## 2020-11-15 NOTE — Progress Notes (Signed)
C/ pacer ck/thresholds  Device functioning appropriately as programmed.   See scanned documents

## 2020-11-27 ENCOUNTER — Inpatient Hospital Stay

## 2020-11-27 ENCOUNTER — Inpatient Hospital Stay
Admit: 2020-11-27 | Discharge: 2020-12-03 | Disposition: A | Discharge status: Home / Self Care | Payer: MEDICARE | Attending: Internal Medicine | Admitting: Internal Medicine

## 2020-11-27 DIAGNOSIS — R0902 Hypoxemia: Secondary | ICD-10-CM

## 2020-11-27 DIAGNOSIS — I130 Hypertensive heart and chronic kidney disease with heart failure and stage 1 through stage 4 chronic kidney disease, or unspecified chronic kidney disease: Secondary | ICD-10-CM

## 2020-11-27 LAB — CBC WITH AUTOMATED DIFF
ABS. BASOPHILS: 0 10*3/uL (ref 0.0–0.1)
ABS. EOSINOPHILS: 0.1 10*3/uL (ref 0.0–0.4)
ABS. IMM. GRANS.: 0 10*3/uL (ref 0.00–0.04)
ABS. LYMPHOCYTES: 1.5 10*3/uL (ref 0.8–3.5)
ABS. MONOCYTES: 0.6 10*3/uL (ref 0.0–1.0)
ABS. NEUTROPHILS: 8.3 10*3/uL — ABNORMAL HIGH (ref 1.8–8.0)
ABSOLUTE NRBC: 0 10*3/uL (ref 0.00–0.01)
BASOPHILS: 0 % (ref 0–1)
EOSINOPHILS: 1 % (ref 0–7)
HCT: 36.2 % (ref 35.0–47.0)
HGB: 11.6 g/dL (ref 11.5–16.0)
IMMATURE GRANULOCYTES: 0 % (ref 0.0–0.5)
LYMPHOCYTES: 14 % (ref 12–49)
MCH: 31.3 PG (ref 26.0–34.0)
MCHC: 32 g/dL (ref 30.0–36.5)
MCV: 97.6 FL (ref 80.0–99.0)
MONOCYTES: 6 % (ref 5–13)
MPV: 10.7 FL (ref 8.9–12.9)
NEUTROPHILS: 79 % — ABNORMAL HIGH (ref 32–75)
NRBC: 0 PER 100 WBC
PLATELET: 75 10*3/uL — ABNORMAL LOW (ref 150–400)
RBC: 3.71 M/uL — ABNORMAL LOW (ref 3.80–5.20)
RDW: 13.7 % (ref 11.5–14.5)
WBC: 10.5 10*3/uL (ref 3.6–11.0)

## 2020-11-27 LAB — SAMPLES BEING HELD

## 2020-11-27 LAB — METABOLIC PANEL, COMPREHENSIVE
A-G Ratio: 1 — ABNORMAL LOW (ref 1.1–2.2)
ALT (SGPT): 13 U/L (ref 12–78)
AST (SGOT): 13 U/L — ABNORMAL LOW (ref 15–37)
Albumin: 3.3 g/dL — ABNORMAL LOW (ref 3.5–5.0)
Alk. phosphatase: 59 U/L (ref 45–117)
Anion gap: 2 mmol/L — ABNORMAL LOW (ref 5–15)
BUN/Creatinine ratio: 16 (ref 12–20)
BUN: 25 MG/DL — ABNORMAL HIGH (ref 6–20)
Bilirubin, total: 2.7 MG/DL — ABNORMAL HIGH (ref 0.2–1.0)
CO2: 36 mmol/L — ABNORMAL HIGH (ref 21–32)
Calcium: 8.8 MG/DL (ref 8.5–10.1)
Chloride: 105 mmol/L (ref 97–108)
Creatinine: 1.55 MG/DL — ABNORMAL HIGH (ref 0.55–1.02)
Globulin: 3.3 g/dL (ref 2.0–4.0)
Glucose: 126 mg/dL — ABNORMAL HIGH (ref 65–100)
Potassium: 4.6 mmol/L (ref 3.5–5.1)
Protein, total: 6.6 g/dL (ref 6.4–8.2)
Sodium: 143 mmol/L (ref 136–145)
eGFR: 37 mL/min/{1.73_m2} — ABNORMAL LOW (ref >60)

## 2020-11-27 LAB — URINALYSIS W/ REFLEX CULTURE
Bilirubin, Urine: NEGATIVE
Bilirubin: NEGATIVE
Glucose, Ur: NEGATIVE mg/dL
Glucose: NEGATIVE mg/dL
Ketone: NEGATIVE mg/dL
Ketones, Urine: NEGATIVE mg/dL
Nitrite, Urine: NEGATIVE
Nitrites: NEGATIVE
Specific Gravity, UA: 1.025 (ref 1.003–1.030)
Specific gravity: 1.025 (ref 1.003–1.030)
Urobilinogen, UA, POCT: 2 EU/dL — ABNORMAL HIGH (ref 0.2–1.0)
Urobilinogen: 2 EU/dL — ABNORMAL HIGH (ref 0.2–1.0)
pH (UA): 7 (ref 5.0–8.0)
pH, UA: 7 (ref 5.0–8.0)

## 2020-11-27 LAB — PROTHROMBIN TIME + INR
INR: 1.1 (ref 0.9–1.1)
Prothrombin time: 11.8 s — ABNORMAL HIGH (ref 9.0–11.1)

## 2020-11-27 LAB — POC LACTIC ACID: Lactic Acid (POC): 1.21 mmol/L (ref 0.40–2.00)

## 2020-11-27 LAB — COVID-19 RAPID TEST: COVID-19 rapid test: NOT DETECTED

## 2020-11-27 LAB — TROPONIN-HIGH SENSITIVITY: Troponin-High Sensitivity: 13 ng/L (ref 0–51)

## 2020-11-27 LAB — NT-PRO BNP: NT pro-BNP: 2198 PG/ML — ABNORMAL HIGH (ref <125)

## 2020-11-27 LAB — CBC WITH AUTO DIFFERENTIAL
Basophils %: 0 % (ref 0–1)
Basophils Absolute: 0 10*3/uL (ref 0.0–0.1)
Eosinophils %: 1 % (ref 0–7)
Eosinophils Absolute: 0.1 10*3/uL (ref 0.0–0.4)
Granulocyte Absolute Count: 0 10*3/uL (ref 0.00–0.04)
Hematocrit: 36.2 % (ref 35.0–47.0)
Hemoglobin: 11.6 g/dL (ref 11.5–16.0)
Immature Granulocytes: 0 % (ref 0.0–0.5)
Lymphocytes %: 14 % (ref 12–49)
Lymphocytes Absolute: 1.5 10*3/uL (ref 0.8–3.5)
MCH: 31.3 PG (ref 26.0–34.0)
MCHC: 32 g/dL (ref 30.0–36.5)
MCV: 97.6 FL (ref 80.0–99.0)
MPV: 10.7 FL (ref 8.9–12.9)
Monocytes %: 6 % (ref 5–13)
Monocytes Absolute: 0.6 10*3/uL (ref 0.0–1.0)
NRBC Absolute: 0 10*3/uL (ref 0.00–0.01)
Neutrophils %: 79 % — ABNORMAL HIGH (ref 32–75)
Neutrophils Absolute: 8.3 10*3/uL — ABNORMAL HIGH (ref 1.8–8.0)
Nucleated RBCs: 0 PER 100 WBC
Platelets: 75 10*3/uL — ABNORMAL LOW (ref 150–400)
RBC: 3.71 M/uL — ABNORMAL LOW (ref 3.80–5.20)
RDW: 13.7 % (ref 11.5–14.5)
WBC: 10.5 10*3/uL (ref 3.6–11.0)

## 2020-11-27 LAB — COMPREHENSIVE METABOLIC PANEL
ALT: 13 U/L (ref 12–78)
AST: 13 U/L — ABNORMAL LOW (ref 15–37)
Albumin/Globulin Ratio: 1 — ABNORMAL LOW (ref 1.1–2.2)
Albumin: 3.3 g/dL — ABNORMAL LOW (ref 3.5–5.0)
Alkaline Phosphatase: 59 U/L (ref 45–117)
Anion Gap: 2 mmol/L — ABNORMAL LOW (ref 5–15)
BUN: 25 MG/DL — ABNORMAL HIGH (ref 6–20)
Bun/Cre Ratio: 16 (ref 12–20)
CO2: 36 mmol/L — ABNORMAL HIGH (ref 21–32)
Calcium: 8.8 MG/DL (ref 8.5–10.1)
Chloride: 105 mmol/L (ref 97–108)
Creatinine: 1.55 MG/DL — ABNORMAL HIGH (ref 0.55–1.02)
ESTIMATED GLOMERULAR FILTRATION RATE: 37 mL/min/{1.73_m2} — ABNORMAL LOW (ref >60)
Globulin: 3.3 g/dL (ref 2.0–4.0)
Glucose: 126 mg/dL — ABNORMAL HIGH (ref 65–100)
Potassium: 4.6 mmol/L (ref 3.5–5.1)
Sodium: 143 mmol/L (ref 136–145)
Total Bilirubin: 2.7 MG/DL — ABNORMAL HIGH (ref 0.2–1.0)
Total Protein: 6.6 g/dL (ref 6.4–8.2)

## 2020-11-27 LAB — TROPONIN, HIGH SENSITIVITY: Troponin, High Sensitivity: 13 ng/L (ref 0–51)

## 2020-11-27 LAB — POCT LACTIC ACID: POC Lactic Acid: 1.21 mmol/L (ref 0.40–2.00)

## 2020-11-27 LAB — COVID-19, RAPID: SARS-CoV-2, Rapid: NOT DETECTED

## 2020-11-27 LAB — PROTIME-INR
INR: 1.1 (ref 0.9–1.1)
Protime: 11.8 s — ABNORMAL HIGH (ref 9.0–11.1)

## 2020-11-27 LAB — PROBNP, N-TERMINAL: BNP: 2198 PG/ML — ABNORMAL HIGH (ref <125)

## 2020-11-27 MED ORDER — SODIUM CHLORIDE 0.9 % IV
1000 mg | INTRAVENOUS | Status: AC
Start: 2020-11-27 — End: 2020-11-28
  Administered 2020-11-28: 04:00:00 via INTRAVENOUS

## 2020-11-27 MED ORDER — IPRATROPIUM-ALBUTEROL 2.5 MG-0.5 MG/3 ML NEB SOLUTION
2.5 mg-0.5 mg/3 ml | Freq: Four times a day (QID) | RESPIRATORY_TRACT | Status: DC
Start: 2020-11-27 — End: 2020-11-30
  Administered 2020-11-28 – 2020-11-30 (×11): via RESPIRATORY_TRACT

## 2020-11-27 MED ORDER — SODIUM CHLORIDE 0.9 % IV PIGGY BACK
3.375 gram | Freq: Three times a day (TID) | INTRAVENOUS | Status: DC
Start: 2020-11-27 — End: 2020-12-02
  Administered 2020-11-28 – 2020-12-02 (×13): via INTRAVENOUS

## 2020-11-27 MED ADMIN — piperacillin-tazobactam (ZOSYN) 4.5 g in 0.9% sodium chloride (MBP/ADV) 100 mL MBP: INTRAVENOUS | NDC 65219025915

## 2020-11-27 MED ADMIN — iopamidoL (ISOVUE-370) 76 % injection 100 mL: INTRAVENOUS | @ 19:00:00 | NDC 00270131635

## 2020-11-27 MED ADMIN — furosemide (LASIX) injection 40 mg: INTRAVENOUS | NDC 63323028026

## 2020-11-27 MED FILL — FUROSEMIDE 10 MG/ML IJ SOLN: 10 mg/mL | INTRAMUSCULAR | Qty: 4

## 2020-11-27 MED FILL — ISOVUE-370  76 % INTRAVENOUS SOLUTION: 370 mg iodine /mL (76 %) | INTRAVENOUS | Qty: 100

## 2020-11-27 MED FILL — IPRATROPIUM-ALBUTEROL 2.5 MG-0.5 MG/3 ML NEB SOLUTION: 2.5 mg-0.5 mg/3 ml | RESPIRATORY_TRACT | Qty: 3

## 2020-11-27 MED FILL — VANCOMYCIN 1,000 MG IV SOLR: 1000 mg | INTRAVENOUS | Qty: 1000

## 2020-11-27 MED FILL — PIPERACILLIN-TAZOBACTAM 4.5 GRAM IV SOLR: 4.5 gram | INTRAVENOUS | Qty: 4.5

## 2020-11-27 NOTE — ED Provider Notes (Signed)
65 year old female past medical history of aortic valve replacement, CKD, diabetes, GERD, hyperlipidemia, hypothyroidism, pacemaker placement, who presents ED for 1 week of weight gain, swelling bilateral lower legs, swelling of her abdomen, dyspnea.  Has had some shortness of breath.  Denies any fevers or chills.  Denies nausea or vomiting.  Symptoms of gotten progressively worse.  Patient used to smoke multiple years ago.      Abdominal Pain   Pertinent negatives include no fever, no dysuria and no chest pain.      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      No family history on file.    Social History     Socioeconomic History    Marital status: SINGLE     Spouse name: Not on file    Number of children: Not on file    Years of education: Not on file    Highest education level: Not on file   Occupational History    Not on file   Tobacco Use    Smoking status: Former     Packs/day: 1.00     Years: 20.00     Pack years: 20.00     Types: Cigarettes     Quit date: 03/21/2010     Years since quitting: 10.6    Smokeless tobacco: Never   Vaping Use    Vaping Use: Never used   Substance and Sexual Activity    Alcohol use: Not Currently    Drug use: Never    Sexual activity: Not on file   Other Topics Concern    Not on file   Social History Narrative    Not on file     Social Determinants of Health     Financial Resource Strain: Not on file   Food Insecurity: Not on file   Transportation Needs: Not on file   Physical Activity: Not on file   Stress: Not on file   Social Connections: Not on file   Intimate Partner Violence: Not on file   Housing  Stability: Not on file         ALLERGIES: Nitroglycerin, Aloe vera, Hydrochlorothiazide, and Tetanus and diphther. tox (pf)    Review of Systems   Constitutional:  Negative for fever.   Respiratory:  Positive for shortness of breath.    Cardiovascular:  Negative for chest pain.   Gastrointestinal:  Positive for abdominal pain.   Genitourinary:  Negative for dysuria.   All other systems reviewed and are negative.    Vitals:    11/27/20 1215   BP: 138/60   Pulse: (!) 110   Resp: 16   Temp: 99.4 F (37.4 C)   SpO2: (!) 82%            Physical Exam  Vitals and nursing note reviewed.   Constitutional:       General: She is  not in acute distress.     Appearance: Normal appearance. She is not ill-appearing.   HENT:      Head: Normocephalic and atraumatic.      Right Ear: External ear normal.      Left Ear: External ear normal.      Nose: Nose normal.      Mouth/Throat:      Mouth: Mucous membranes are moist.      Pharynx: Oropharynx is clear.   Eyes:      Extraocular Movements: Extraocular movements intact.      Conjunctiva/sclera: Conjunctivae normal.   Cardiovascular:      Rate and Rhythm: Normal rate and regular rhythm.      Pulses: Normal pulses.      Heart sounds: Normal heart sounds.   Pulmonary:      Effort: Pulmonary effort is normal. No respiratory distress.      Breath sounds: Normal breath sounds. No wheezing.      Comments: Hypoxic on room air  Abdominal:      General: Abdomen is flat. Bowel sounds are normal.      Palpations: Abdomen is soft.      Tenderness: There is no abdominal tenderness. There is no guarding.      Comments: Anasarca of the abdomen   Genitourinary:     Comments: Deferred  Musculoskeletal:         General: Normal range of motion.      Cervical back: Normal range of motion.      Right lower leg: Edema present.      Left lower leg: Edema present.   Skin:     General: Skin is warm and dry.      Capillary Refill: Capillary refill takes less than 2 seconds.      Findings: No rash.    Neurological:      General: No focal deficit present.      Mental Status: She is alert and oriented to person, place, and time.      Comments: Moving all extremities   Psychiatric:         Mood and Affect: Mood normal.         Behavior: Behavior normal.      Comments: Has decision making capacity        MDM  Number of Diagnoses or Management Options  Acute congestive heart failure, unspecified heart failure type (HCC)  Colitis  Elevated serum creatinine  Hypoxia  Urinary tract infection with hematuria, site unspecified  Diagnosis management comments: Differential includes heart failure, pneumonia, liver failure, UTI, ACS, PE           Procedures      Perfect Serve Consult for Admission  4:24 PM    ED Room Number: CW/CW  Patient Name and age:  Deborah [name] 65 y.o.  female  Working Diagnosis:   1. Hypoxia    2. Colitis    3. Urinary tract infection with hematuria, site unspecified    4. Acute congestive heart failure, unspecified heart failure type (HCC)    5. Elevated serum creatinine        COVID-19 Suspicion:  no  Sepsis present:  yes  Reassessment needed: yes  Code Status:  Full Code  Readmission: no  Isolation Requirements:  no  Recommended Level of Care:  med/surg  Department:[facility] - [phone]  Other:  65 year old female presents for fluid retention, dyspnea.  Was found to be hypoxic in the waiting room with oxygen  saturation in the 80s on room air.  Currently on nasal cannula oxygen.  COVID test is negative, BNP of 2198.  Will give Lasix.  Patient reports an allergy to nitroglycerin.  No leukocytosis, urinalysis shows trace leukocyte Estrace and blood.  Patient has elevated serum creatinine of 1.55 up from 3 weeks ago 1.30.  High-sensitivity troponin 13.  CT of the chest, CT abdomen pelvis IV contrast shows trace pleural effusion, cirrhotic changes with hepatic steatosis, cardiomegaly, findings suggestive of moderate colitis affecting the ascending and transverse colon.  Given the patient's vital  signs so slightly tachycardic, hypoxia, likely secondary to heart failure will give Lasix.  Starting empiric antibitoics with Vanc/Zosyn.

## 2020-11-27 NOTE — Consults (Signed)
Gastroenterology Consult     Referring Physician: Dr. Servando Salina    Consult Date: 11/27/2020     Subjective:     Chief Complaint: Chest pain difficulty breathing    History of Present Illness: Deborah [name] is a 65 y.o. female who is seen in consultation for abdominal pain, abnormal CT.  Deborah [name] has a myriad of medical problems; she states she is moved here about a year ago from [place] where she had her GI evaluation.  GI evaluation included colonoscopy; her understanding is nothing was found.  She has been told of liver cirrhosis; the only diagnosis linked to cirrhosis is her morbid obesity.    She presents to the hospital with abdominal pain, chest pain, difficulty breathing; CT scan is abnormal with identification of colonic wall thickening.  She does not have diarrhea; in fact she has constipation with a couple of bowel movements per week.  She has not had vomiting, visible blood loss, fever.  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   Family History   Problem Relation Age of Onset    Hypertension Mother     Hypertension Father      Social History     Tobacco Use    Smoking status: Former     Packs/day: 1.00     Years: 20.00     Pack years: 20.00     Types: Cigarettes     Quit date: 03/21/2010     Years since quitting: 10.6    Smokeless tobacco: Never   Substance Use Topics    Alcohol use: Not Currently      Allergies   Allergen Reactions    Nitroglycerin Unknown (comments)     hypotension    Aloe Vera Rash    Hydrochlorothiazide Other (comments)     Reports 'kidneys dry up"     Tetanus And Diphther. Tox (Pf) Swelling     Swelling of arm  and it turns black     Current Facility-Administered Medications   Medication Dose Route Frequency    piperacillin-tazobactam (ZOSYN) 4.5 g in 0.9% sodium chloride (MBP/ADV) 100 mL MBP  4.5 g IntraVENous NOW    furosemide (LASIX) injection 40 mg  40 mg IntraVENous NOW    vancomycin (VANCOCIN) 1,000 mg in 0.9% sodium chloride 250 mL (Vial2Bag)  1 g IntraVENous NOW    And    vancomycin (VANCOCIN) 1,000 mg in 0.9% sodium chloride 250 mL (Vial2Bag)  1 g IntraVENous NOW    piperacillin-tazobactam (ZOSYN) 3.375 g in 0.9% sodium chloride (MBP/ADV) 100 mL MBP  3.375 g IntraVENous Q8H    insulin lispro (HUMALOG) injection   SubCUTAneous AC&HS    glucose chewable tablet 16 g  4 Tablet Oral PRN    glucagon (GLUCAGEN) injection 1 mg  1 mg IntraMUSCular PRN    dextrose 10% infusion 0-250 mL  0-250 mL IntraVENous PRN  albuterol-ipratropium (DUO-NEB) 2.5 MG-0.5 MG/3 ML  3 mL Nebulization Q6H RT     Current Outpatient Medications   Medication Sig    lidocaine (Lidoderm) 5 % Apply patch to the affected area for 12 hours a day and remove for 12 hours a day.    metoprolol succinate (TOPROL-XL) 50 mg XL tablet TAKE 1 TABLET BY MOUTH EVERY DAY    acetaminophen (TYLENOL) 500 mg tablet Take 1,000 mg by mouth every six (6) hours as needed for Pain.    albuterol (PROVENTIL HFA, VENTOLIN HFA, PROAIR HFA) 90 mcg/actuation inhaler Take 2 Puffs by inhalation every six (6) hours as needed.    atorvastatin (LIPITOR) 80 mg tablet     calcium-cholecalciferol, D3, (CALTRATE 600+D) tablet Take 1 Tablet by mouth nightly.    cholecalciferol (VITAMIN D3) (1000 Units /25 mcg) tablet Take 1,000 Units by mouth daily.    citalopram (CELEXA) 40 mg tablet Take 40 mg by mouth daily.    fluticasone propionate (FLONASE) 50 mcg/actuation nasal spray 2 Sprays by Nasal route daily as needed.    gabapentin (NEURONTIN) 600 mg tablet TAKE 1 TABLET BY MOUTH 4 TIMES A DAY    levothyroxine (SYNTHROID) 137 mcg tablet TAKE 1 BY MOUTH DAILY, FOLLOW UP IN 90 DAYS FOR  RECHECK THYROID LEVEL    sucralfate (CARAFATE) 1 gram tablet Take 1 g by mouth three (3) times daily.    torsemide (DEMADEX) 20 mg tablet TAKE 1 BY MOUTH DAILY F/U WITH CARDIOLOGY    traZODone (DESYREL) 100 mg tablet Take 100 mg by mouth nightly.    EPINEPHrine (EPIPEN) 0.3 mg/0.3 mL injection     Xtampza ER 9 mg capsule TAKE 1 CAPSULE BY MOUTH EVERY 12 HOURS FILL 02/12/20    glimepiride (AMARYL) 2 mg tablet Take 2 mg by mouth two (2) times a day.    montelukast (SINGULAIR) 10 mg tablet Take 10 mg by mouth nightly.        Review of Systems:  A detailed 10 organ review of systems is obtained with pertinent positives as listed in the History of Present Illness and Past Medical History. All others are negative.    Objective:     Physical Exam:  Visit Vitals  BP 138/60 (BP 1 Location: Right upper arm, BP Patient Position: At rest)   Pulse 84   Temp 99.4 ??F (37.4 ??C)   Resp 16   SpO2 100%        Skin:  Extremities and face reveal no rashes. No palmer erythema.   HEENT: Sclerae anicteric. Extra-occular muscles are intact. No oral ulcers.  No abnormal pigmentation of the lips. The neck is supple.  Cardiovascular: Irregular rate and rhythm.  2/6 systolic murmur; cardiac monitor shows bigeminy.  Respiratory:  Comfortable breathing with no accessory muscle use.  Scant bibasilar rales   GI:  Abdomen Marked obesity; mild mid tender.  Normal active bowel sounds.   Musculoskeletal: Bilateral ankle edema of the lower legs.   Neurological:  Gross memory appears intact.  Patient is alert and oriented.  Psychiatric:  Mood appears appropriate with judgement intact.  Lymphatic:  No cervical or supraclavicular adenopathy.    Lab/Data Review:  CMP:   Lab Results   Component Value Date/Time    NA 143 11/27/2020 01:13 PM    K 4.6 11/27/2020 01:13 PM    CL 105 11/27/2020 01:13 PM    CO2 36 (H) 11/27/2020 01:13 PM    AGAP 2 (L) 11/27/2020 01:13 PM  GLU 126 (H) 11/27/2020 01:13 PM    BUN 25 (H) 11/27/2020 01:13 PM    CREA 1.55 (H) 11/27/2020  01:13 PM    CA 8.8 11/27/2020 01:13 PM    ALB 3.3 (L) 11/27/2020 01:13 PM    TP 6.6 11/27/2020 01:13 PM    GLOB 3.3 11/27/2020 01:13 PM    AGRAT 1.0 (L) 11/27/2020 01:13 PM    ALT 13 11/27/2020 01:13 PM     CBC:   Lab Results   Component Value Date/Time    WBC 10.5 11/27/2020 01:13 PM    HGB 11.6 11/27/2020 01:13 PM    HCT 36.2 11/27/2020 01:13 PM    PLT 75 (L) 11/27/2020 01:13 PM     VASCULATURE: There is aortic atherosclerotic change. 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 Status post CABG with cardiac pacer. There is mild  cardiomegaly. Mild atherosclerotic changes the origin of the celiac and SMA.  No aortic aneurysm or dissection. No proximal pulmonary embolism is identified.   MEDIASTINUM/HEART: Cardiomegaly.  No hilar or mediastinal lymphadenopathy. No  esophageal mass. No endotracheal or endobronchial mass.  PLEURA/LUNGS: Trace right-sided effusion with right basilar atelectasis. Small  hiatal hernia. No nodule, mass, or airspace disease. There is no pleural or  pericardial effusion.  SOFT TISSUE/ AXILLA: No axillary adenopathy. No chest wall mass.  LIVER/GALLBLADDER: Hepatic steatosis with nodular contour suggesting cirrhotic  change. Cholecystectomy. There is no intrahepatic duct dilatation. The CBD is  not dilated. There is no hepatic parenchymal mass. Hepatic enhancement pattern  is within normal limits. The portal vein is patent.  SPLEEN/PANCREAS: No mass.. There is no pancreatic mass. There is no pancreatic  duct dilatation. There is no evidence of splenomegaly.   ADRENALS/KIDNEYS: Left adrenal nodule not characterized unchanged 10 x 12 mm  4-32. There is no adrenal mass. There is no hydroureter or hydronephrosis.  Punctate nonobstructive right renal calculus There is no perinephric mass. No  ureteral or bladder calculus.  STOMACH, COLON AND SMALL BOWEL: There is ascending colonic wall thickening.  There is transverse colonic wall thickening. There is no obstruction or free  intraperitoneal  air. There is no evidence of incarceration or obstruction.  Colonic diverticulosis. No mesenteric adenopathy.   APPENDIX: Not visualized  PERITONEUM/RETROPERITONEUM: There is no abdominal aortic aneurysm. No  significant lymphadenopathy. IVC filter in place.  URINARY BLADDER: No mass or calculus.  PELVIS: There is no pelvic adenopathy. There is no pelvic sidewall mass. There  is no inguinal adenopathy.  BONES: Multilevel severe canal and foraminal stenosis. Vacuum disc phenomena..  No lytic or blastic lesions. No evidence of fracture or dislocation.      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  As stated above my suspicion for liver cirrhosis is her massive obesity and likely diagnosis therefore of fatty liver.    Will start on bisacodyl; definitive colonic evaluation will have to await cardiopulmonary evaluation, treatment, and improvement.

## 2020-11-27 NOTE — Discharge Summary (Signed)
[facility]  DISCHARGE SUMMARY    Name:  Deborah [name], Deborah [name]  MR#:  [id number]  DOB:  08-21-1955  ACCOUNT #:  [id number]  ADMIT DATE:  11/27/2020  DISCHARGE DATE:  12/03/2020    ADMISSION DIAGNOSES:  Shortness of breath and abdominal pain.    DISCHARGE DIAGNOSIS:  Ischemic colitis.    HISTORY OF PRESENT ILLNESS:  The patient is a 65 year old female who was admitted with exertional dyspnea.  She has previous history significant for diabetes, chronic kidney disease, chronic pain, diabetes, and GERD.  The patient was thought to have CHF exacerbation, proBNP was elevated.  The patient was started on IV Bumex.  Subsequently, Cardiology consult was done.  The patient was diagnosed to have heart failure with preserved ejection fraction along  she has PVC.  It was thought that the patient will need ischemia evaluation, but due to body habitus it was difficult to get a stress nuclear study.  The patient's Toprol XL was increased and she was started on amiodarone by Cardiology.  Amiodarone was ordered for short-term.  Ejection fraction 65% to 70%.  She had frequent PVCs. .  Plan is to continue amiodarone 400 mg b.i.d. for one week then 400 mg daily for one week then 200 mg daily.  The patient will need outpatient followup with  her Cardiologist .  EP recommended two day Lexiscan in August.  Aortic valve showed velocity of more than 3 m/s.  The patient was also seen by Nephrology and diuretics were added.  She finally had colonoscopy done by Dr. Ian Malkin, which showed ischemic colitis.  Aspirin 81 mg was recommended.  The patient had some nasal bleed after EGD.  Rhino Rocket was put in which is discontinued on the day of discharge, the patient is feeling much better.  Her antibiotic has been stopped two days ago.  Her creatinine is 1.4.  She has no white count.  The patient is supposed to follow up with Cardiology as mentioned above.  She is also supposed to follow up with Nephrology and GI along with  primary care physician.    CONDITION AT DISCHARGE:  Stable.    DISCHARGE MEDICATIONS:  Include the following:  1.  Continue albuterol.  2.  Continue Lipitor 80 mg daily.  3.  Continue Flonase.  4.  Continue Amaryl 2 mg twice daily.  5.  Continue Celexa 40 mg daily.  6.  Continue levothyroxine 137 mcg daily.  7.  Metoprolol XL 50 mg daily.  8.  Singulair 10 mg daily.  9.  Carafate 1 g three times daily.  10.  New prescription baby aspirin once a day.  11.  New prescription of amiodarone, two tablets twice a day for one week then decrease to two tablets for one week then decrease to 1 tablet daily.  12.  Bumex 2 mg daily twice daily.    More than 40 minutes spent on discharge.      Carrolyn Meiers, MD      [id number]  D:  12/03/2020 9:45  T:  12/03/2020 12:03  JOB #:  [id number]

## 2020-11-27 NOTE — Consults (Signed)
[facility]  CONSULTATION    Name:  Deborah [name], Deborah [name]  MR#:  [id number]  DOB:  06-14-1955  ACCOUNT #:  [id number]  DATE OF SERVICE:  11/28/2020    NEPHROLOGY CONSULTATION    REQUESTING PHYSICIAN:  Dr. Sherlon Handing.    CHIEF COMPLAINT:  Elevated creatinine.    HISTORY OF PRESENT ILLNESS:  The patient is a 65 year old white female with morbid obesity, type 2 diabetes mellitus and chronic pain syndrome who came in with worsening shortness of breath, swelling and abdominal pain.  Workup showed a thickened colon, possible colitis and evidence of cirrhotic change of the liver.  She is getting an echo.  She has been started on antibiotics and diuretics.  Her serum creatinine is 1.4.  She has never been seen by [place] before, but labs available to me show that this creatinine has been stable for at least 6 months.  She tells me she has a history of acute kidney injury which did not require dialysis back in 2015.  She moved here from [place] about a year ago.  She has not followed with a nephrologist before.    PAST MEDICAL HISTORY:  1.  Type 2 diabetes mellitus.  2.  Obesity.  3.  Hypothyroidism.  4.  Hyperlipidemia.  5.  Chronic pain syndrome, on narcotics.  6.  History of pacemaker placement.    FAMILY HISTORY:  No documented family history of renal disease.    SOCIAL HISTORY:  She is a former smoker.    REVIEW OF SYSTEMS:  CONSTITUTIONAL:  No fevers or chills.  GI:  Positive for abdominal pain.  She has seen some blood in her stool.  GU:  She is passing urine.  No hematuria.  CARDIOVASCULAR:  Positive for edema and shortness of breath.  All other systems reviewed and are negative except as per history of present illness.    PHYSICAL EXAMINATION:  VITAL SIGNS:  Temperature 99, pulse 84, blood pressure 87/46.  GENERAL:  Morbidly obese white female who is alert, in no acute distress.  EYES:  Anicteric.  Extraocular movements are intact.  ENMT:  Mucous membranes are moist.  There is no  epistaxis.  NECK:  Nontender.  No visible JVD.  HEART:  Regular.  There is 1+ lower extremity edema.  RESPIRATORY:  Lungs are clear anterolaterally with no distress.  She is on nasal cannula oxygen.  GI:  Abdomen is soft.  There is no guarding or rebound.  SKIN:  Warm with some stasis changes on legs.  There is no rash.  GENITOURINARY:  There is a PureWick in place with dark yellow urine output.    LABORATORY DATA:  Sodium 142, potassium 4.0, chloride 103, bicarb 34, BUN 22, creatinine 1.42.  White count 8.9, hemoglobin 11.4, platelets 57.    RADIOGRAPHIC STUDIES:  CT scan of the abdomen and pelvis showed evidence of colitis.  There was no urinary obstruction or renal abnormalities.    ASSESSMENT:  This patient with multiple medical problems who appears to have chronic kidney disease stage IIIB which is stable.  Etiology of her chronic kidney disease is likely multifactorial.  She has only trace protein on urinalysis.  Kidneys were unremarkable on abdominal CT.    PLAN:  1.  We will check quantitative urinary protein.  2.  Continue supportive care.  3.  Monitor creatinine while on diuretics and antibiotics.  4.  We will follow with you to assist in her management during this hospitalization.  Latricia Heft, MD      [id number]  D:  11/28/2020 11:45  T:  11/28/2020 13:34  JOB #:  [id number]

## 2020-11-27 NOTE — ED Notes (Signed)
Triage: Pt advises she caught a URI from family. Since then she has had right sided abd pain that radiates to front.

## 2020-11-27 NOTE — H&P (Signed)
[facility]  [address] [place], [place]  [address]  [phone]    Hospital Medicine Admission History and Physical      NAME:  Deborah [name]   DOB:   1955/04/02   MRN:  [id number]     PCP:  Deborah Jansky, NP     Date of service:  11/27/2020         Subjective:     CHIEF COMPLAINT: Shortness of breath, leg swelling, weight gain, abdominal pain    HISTORY OF PRESENT ILLNESS:     [name] is a 65 y.o.  Caucasian female who is admitted with exertional dyspnea.  [name] past medical history of DM, CKD, chronic pain, chronic narcotic use, DM, GERD, hyperlipidemia, hypothyroidism, morbid obesity, neuropathy, pacemaker presented to ER complaining of shortness of breath, leg swelling, weight gain and abdominal pain.  The shortness of breath started 3 days ago and got worse progressively.  The shortness of breath is worse with minimal exertion.  Patient denies chest pain.  Has been having bilateral leg swelling and weight gain.  Denies cough or fever.  Patient also complaining of generalized abdominal pain, which is crampy.  Denies diarrhea or constipation.       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    Social History     Tobacco Use    Smoking status: Former     Packs/day: 1.00     Years: 20.00     Pack years: 20.00     Types: Cigarettes     Quit date: 03/21/2010     Years since quitting: 10.6    Smokeless tobacco: Never   Substance Use Topics    Alcohol use: Not Currently        Family History   Problem Relation Age of Onset    Hypertension Mother     Hypertension Father         Allergies    Allergen Reactions    Nitroglycerin Unknown (comments)     hypotension    Aloe Vera Rash    Hydrochlorothiazide Other (comments)     Reports 'kidneys dry up"     Tetanus And Diphther. Tox (Pf) Swelling     Swelling of arm and it turns black        Prior to Admission medications    Medication Sig Start Date End Date Taking? Authorizing Provider   lidocaine (Lidoderm) 5 % Apply patch to the affected area for 12 hours a day and remove for 12 hours a day. 11/05/20   Francoise Ceo, DO   metoprolol succinate (TOPROL-XL) 50 mg XL tablet TAKE 1 TABLET BY MOUTH EVERY DAY 10/27/20   Costella Hatcher B, NP   acetaminophen (TYLENOL) 500 mg tablet Take 1,000 mg by mouth every six (6) hours as needed for Pain.  Provider, Historical   albuterol (PROVENTIL HFA, VENTOLIN HFA, PROAIR HFA) 90 mcg/actuation inhaler Take 2 Puffs by inhalation every six (6) hours as needed.    Provider, Historical   atorvastatin (LIPITOR) 80 mg tablet  01/04/20   Provider, Historical   calcium-cholecalciferol, D3, (CALTRATE 600+D) tablet Take 1 Tablet by mouth nightly.    Provider, Historical   cholecalciferol (VITAMIN D3) (1000 Units /25 mcg) tablet Take 1,000 Units by mouth daily.    Provider, Historical   citalopram (CELEXA) 40 mg tablet Take 40 mg by mouth daily. 02/22/20   Provider, Historical   fluticasone propionate (FLONASE) 50 mcg/actuation nasal spray 2 Sprays by Nasal route daily as needed.    Provider, Historical   gabapentin (NEURONTIN) 600 mg tablet TAKE 1 TABLET BY MOUTH 4 TIMES A DAY 02/22/20   Provider, Historical   levothyroxine (SYNTHROID) 137 mcg tablet TAKE 1 BY MOUTH DAILY, FOLLOW UP IN 90 DAYS FOR RECHECK THYROID LEVEL 02/24/20   Provider, Historical   sucralfate (CARAFATE) 1 gram tablet Take 1 g by mouth three (3) times daily.    Provider, Historical   torsemide (DEMADEX) 20 mg tablet TAKE 1 BY MOUTH DAILY F/U WITH CARDIOLOGY 02/22/20   Provider, Historical   traZODone (DESYREL) 100 mg tablet Take 100 mg by mouth nightly. 12/31/19    Provider, Historical   EPINEPHrine (EPIPEN) 0.3 mg/0.3 mL injection  01/11/20   Provider, Historical   Xtampza ER 9 mg capsule TAKE 1 CAPSULE BY MOUTH EVERY 12 HOURS FILL 02/12/20 02/24/20   Provider, Historical   glimepiride (AMARYL) 2 mg tablet Take 2 mg by mouth two (2) times a day.    Provider, Historical   montelukast (SINGULAIR) 10 mg tablet Take 10 mg by mouth nightly.    Provider, Historical         Review of Systems:  (bold if positive, if negative)    Gen:  Eyes:  ENT:  CVS:  Pulm:  dyspnea, sGI:  Abdominal pain,GU:  MS:  Skin:  Psych:  Endo:  Hem:  Renal:  Neuro:            Objective:      VITALS:    Vital signs reviewed; most recent are:    Visit Vitals  BP 138/60 (BP 1 Location: Right upper arm, BP Patient Position: At rest)   Pulse (!) 110   Temp 99.4 ??F (37.4 ??C)   Resp 16   SpO2 (!) 82%     SpO2 Readings from Last 6 Encounters:   11/27/20 (!) 82%   11/05/20 92%   08/26/20 95%   05/23/20 93%   04/30/20 97%   03/21/20 94%        No intake or output data in the 24 hours ending 11/27/20 1714         Exam:     Physical Exam:    Gen: Morbid obese, in no acute distress  HEENT:  Pink conjunctivae, PERRL, hearing intact to voice, moist mucous membranes  Neck:  Supple, without masses, thyroid non-tender  Resp:  No accessory muscle use, bilateral basilar rales decreased air entry    Card:  grade 3/6 systolic murmur, normal S1, S2 without thrills, bruit. +++ peripheral edema  Abd:  Soft, non-tender, non-distended, normoactive bowel sounds are present, no palpable organomegaly and no detectable hernias  Lymph:  No cervical or inguinal adenopathy  Musc:  No cyanosis or clubbing  Skin:  No rashes or ulcers, skin turgor is good  Neuro:  Cranial  nerves are grossly intact, no focal motor weakness, follows commands appropriately  Psych:  Good insight, oriented to person, place and time, alert       Labs:    Recent Labs     11/27/20  1313   WBC 10.5   HGB 11.6   HCT 36.2   PLT 75*     Recent Labs     11/27/20  1313   NA  143   K 4.6   CL 105   CO2 36*   GLU 126*   BUN 25*   CREA 1.55*   CA 8.8   ALB 3.3*   TBILI 2.7*   ALT 13     Lab Results   Component Value Date/Time    Glucose (POC) 81 05/23/2020 03:30 PM    Glucose (POC) 130 (H) 04/30/2020 11:27 AM     No results for input(s): PH, PCO2, PO2, HCO3, FIO2 in the last 72 hours.  Recent Labs     11/27/20  1314   INR 1.1       Telemetry reviewed:          Assessment/Plan:    Acute respiratory failure with hypoxia.  This is most likely secondary to fluid overload.  CT of the chest: Trace pleural effusion, no PE.  Continue supplemental oxygen to keep SaO2 greater than 90%.      2.  Exertional dyspnea/ BL leg edema/ wait gain/ elevated proBNP: most likely CHF exacerbation.  Unclear what triggered. Last echo EF is normal.  Start Bumex IV twice daily, check daily weights, I's/O.  Check echocardiogram and consult cardiology.    3.  Aortic valve replaced / Pacemaker - cardiology consulted. Repeat echo.     4.  Abdominal pain/Colitis (11/27/2020).  CT scan of the abdomen shows colitis.  Full liquid diet.  Continue Zosyn and consult GI    5.  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  Cover with sliding scale and check A1c    6.  CKD (chronic kidney disease), stage III (HCC) (04/30/2020).  Avoid nephrotoxic drugs.  Watch Cr as pt was given contrast for CT scan.  Check urine lytes and consult nephrology    7.  Liver cirrhosis (HCC) (11/27/2020)/ Elevated bilirubin (11/27/2020): CT of the abdomen shows: Hepatic steatosis and cirrhotic change.  Avoid hepatotoxic drugs.  Holding statin for now.       8.  Thrombocytopenia (HCC) (04/30/2020).  Likely secondary to above    9.  Hypothyroidism.  Continue Synthroid. Check TSH    10.  Hyperlipidemia .  On statin but will hold for now    11.  Morbid obesity (HCC) (04/30/2020)/OSA: Uses CPAP.    12.  Chronic pain.  Continue pain medicine.     Previous medical records reviewed     Risk of deterioration: high      Total time spent with patient: 35 Minutes                   Care Plan discussed with: Patient, Nursing Staff, and >50% of time spent in counseling and coordination of care    Discussed:  Care Plan    Prophylaxis:  Hep SQ    Probable Disposition:  Home w/Family           ___________________________________________________    Attending Physician: Otho Darner, MD

## 2020-11-28 LAB — ECHO ADULT COMPLETE
AV Area by Peak Velocity: 0.9 cm2
AV Area by VTI: 0.8 cm2
AV Mean Gradient: 28 mmHg
AV Mean Velocity: 2.6 m/s
AV Peak Gradient: 46 mmHg
AV Peak Velocity: 3.4 m/s
AV VTI: 83.8 cm
AV Velocity Ratio: 0.26
AVA/BSA Peak Velocity: 0.3 cm2/m2
AVA/BSA VTI: 0.3 cm2/m2
Est. RA Pressure: 8 mmHg
Fractional Shortening 2D: 23 % (ref 28–44)
IVSd: 1.6 cm — AB (ref 0.6–0.9)
LA Diameter: 3 cm
LA Size Index: 1.15 cm/m2
LV Mass 2D Index: 124.3 g/m2 — AB (ref 43–95)
LV Mass 2D: 324.4 g — AB (ref 67–162)
LV RWT Ratio: 0.68
LVIDd Index: 1.8 cm/m2
LVIDd: 4.7 cm (ref 3.9–5.3)
LVIDs Index: 1.38 cm/m2
LVIDs: 3.6 cm
LVOT Area: 3.5 cm2
LVOT Diameter: 2.1 cm
LVOT Mean Gradient: 2 mmHg
LVOT Peak Gradient: 3 mmHg
LVOT Peak Velocity: 0.9 m/s
LVOT SV: 72.4 ml
LVOT Stroke Volume Index: 27.7 mL/m2
LVOT VTI: 20.9 cm
LVOT:AV VTI Index: 0.25
LVPWd: 1.6 cm — AB (ref 0.6–0.9)
MV A Velocity: 0.82 m/s
MV E Velocity: 1.13 m/s
MV E Wave Deceleration Time: 183.1 ms
MV E/A: 1.38
PV Max Velocity: 1.1 m/s
PV Peak Gradient: 5 mmHg
RVOT Peak Gradient: 3 mmHg
RVOT Peak Velocity: 0.9 m/s
RVSP: 28 mmHg
TR Max Velocity: 2.26 m/s
TR Peak Gradient: 22 mmHg

## 2020-11-28 LAB — CBC W/O DIFF
ABSOLUTE NRBC: 0 10*3/uL (ref 0.00–0.01)
HCT: 37.2 % (ref 35.0–47.0)
HGB: 11.4 g/dL — ABNORMAL LOW (ref 11.5–16.0)
MCH: 30.6 PG (ref 26.0–34.0)
MCHC: 30.6 g/dL (ref 30.0–36.5)
MCV: 100 FL — ABNORMAL HIGH (ref 80.0–99.0)
MPV: 11.1 FL (ref 8.9–12.9)
NRBC: 0 PER 100 WBC
PLATELET: 57 10*3/uL — ABNORMAL LOW (ref 150–400)
RBC: 3.72 M/uL — ABNORMAL LOW (ref 3.80–5.20)
RDW: 14 % (ref 11.5–14.5)
WBC: 8.9 10*3/uL (ref 3.6–11.0)

## 2020-11-28 LAB — LACTIC ACID
Lactic Acid: 1.1 MMOL/L (ref 0.4–2.0)
Lactic acid: 1.1 MMOL/L (ref 0.4–2.0)

## 2020-11-28 LAB — METABOLIC PANEL, COMPREHENSIVE
A-G Ratio: 1.1 (ref 1.1–2.2)
ALT (SGPT): 11 U/L — ABNORMAL LOW (ref 12–78)
AST (SGOT): 11 U/L — ABNORMAL LOW (ref 15–37)
Albumin: 3.2 g/dL — ABNORMAL LOW (ref 3.5–5.0)
Alk. phosphatase: 56 U/L (ref 45–117)
Anion gap: 5 mmol/L (ref 5–15)
BUN/Creatinine ratio: 15 (ref 12–20)
BUN: 22 MG/DL — ABNORMAL HIGH (ref 6–20)
Bilirubin, total: 3.5 MG/DL — ABNORMAL HIGH (ref 0.2–1.0)
CO2: 34 mmol/L — ABNORMAL HIGH (ref 21–32)
Calcium: 8.1 MG/DL — ABNORMAL LOW (ref 8.5–10.1)
Chloride: 103 mmol/L (ref 97–108)
Creatinine: 1.42 MG/DL — ABNORMAL HIGH (ref 0.55–1.02)
Globulin: 2.9 g/dL (ref 2.0–4.0)
Glucose: 131 mg/dL — ABNORMAL HIGH (ref 65–100)
Potassium: 4 mmol/L (ref 3.5–5.1)
Protein, total: 6.1 g/dL — ABNORMAL LOW (ref 6.4–8.2)
Sodium: 142 mmol/L (ref 136–145)
eGFR: 42 mL/min/{1.73_m2} — ABNORMAL LOW (ref >60)

## 2020-11-28 LAB — PROTEIN/CREATININE RATIO, URINE
Creatinine, urine random: 27 mg/dL
Protein, urine random: 6 mg/dL (ref 0.0–11.9)
Protein/Creat. urine Ratio: 0.2

## 2020-11-28 LAB — CREATININE, UR, RANDOM
Creatinine, Ur: 137 mg/dL
Creatinine, urine random: 137 mg/dL

## 2020-11-28 LAB — SAMPLES BEING HELD

## 2020-11-28 LAB — GLUCOSE, POC
Glucose (POC): 173 mg/dL — ABNORMAL HIGH (ref 65–117)
Glucose (POC): 216 mg/dL — ABNORMAL HIGH (ref 65–117)
Glucose (POC): 139 mg/dL — ABNORMAL HIGH (ref 65–117)
Glucose (POC): 112 mg/dL (ref 65–117)

## 2020-11-28 LAB — SODIUM, UR, RANDOM
Sodium,Ur: 81 MMOL/L
Sodium,urine random: 81 MMOL/L

## 2020-11-28 LAB — TSH 3RD GENERATION
TSH: 4.29 u[IU]/mL — ABNORMAL HIGH (ref 0.36–3.74)
TSH: 4.29 u[IU]/mL — ABNORMAL HIGH (ref 0.36–3.74)

## 2020-11-28 LAB — CBC
Hematocrit: 37.2 % (ref 35.0–47.0)
Hemoglobin: 11.4 g/dL — ABNORMAL LOW (ref 11.5–16.0)
MCH: 30.6 PG (ref 26.0–34.0)
MCHC: 30.6 g/dL (ref 30.0–36.5)
MCV: 100 FL — ABNORMAL HIGH (ref 80.0–99.0)
MPV: 11.1 FL (ref 8.9–12.9)
NRBC Absolute: 0 10*3/uL (ref 0.00–0.01)
Nucleated RBCs: 0 PER 100 WBC
Platelets: 57 10*3/uL — ABNORMAL LOW (ref 150–400)
RBC: 3.72 M/uL — ABNORMAL LOW (ref 3.80–5.20)
RDW: 14 % (ref 11.5–14.5)
WBC: 8.9 10*3/uL (ref 3.6–11.0)

## 2020-11-28 LAB — COMPREHENSIVE METABOLIC PANEL
ALT: 11 U/L — ABNORMAL LOW (ref 12–78)
AST: 11 U/L — ABNORMAL LOW (ref 15–37)
Albumin/Globulin Ratio: 1.1 (ref 1.1–2.2)
Albumin: 3.2 g/dL — ABNORMAL LOW (ref 3.5–5.0)
Alkaline Phosphatase: 56 U/L (ref 45–117)
Anion Gap: 5 mmol/L (ref 5–15)
BUN: 22 MG/DL — ABNORMAL HIGH (ref 6–20)
Bun/Cre Ratio: 15 (ref 12–20)
CO2: 34 mmol/L — ABNORMAL HIGH (ref 21–32)
Calcium: 8.1 MG/DL — ABNORMAL LOW (ref 8.5–10.1)
Chloride: 103 mmol/L (ref 97–108)
Creatinine: 1.42 MG/DL — ABNORMAL HIGH (ref 0.55–1.02)
ESTIMATED GLOMERULAR FILTRATION RATE: 42 mL/min/{1.73_m2} — ABNORMAL LOW (ref >60)
Globulin: 2.9 g/dL (ref 2.0–4.0)
Glucose: 131 mg/dL — ABNORMAL HIGH (ref 65–100)
Potassium: 4 mmol/L (ref 3.5–5.1)
Sodium: 142 mmol/L (ref 136–145)
Total Bilirubin: 3.5 MG/DL — ABNORMAL HIGH (ref 0.2–1.0)
Total Protein: 6.1 g/dL — ABNORMAL LOW (ref 6.4–8.2)

## 2020-11-28 LAB — TRANSTHORACIC ECHOCARDIOGRAM (TTE) COMPLETE (CONTRAST/BUBBLE/3D PRN)
AV Area by Peak Velocity: 0.9 cm2
AV Area by VTI: 0.8 cm2
AV Mean Gradient: 28 mmHg
AV Mean Velocity: 2.6 m/s
AV Peak Gradient: 46 mmHg
AV Peak Velocity: 3.4 m/s
AV VTI: 83.8 cm
AV Velocity Ratio: 0.26
AVA/BSA Peak Velocity: 0.3 cm2/m2
AVA/BSA VTI: 0.3 cm2/m2
Est. RA Pressure: 8 mmHg
Fractional Shortening 2D: 23 % (ref 28–44)
IVSd: 1.6 cm — AB (ref 0.6–0.9)
LA Diameter: 3 cm
LA Size Index: 1.15 cm/m2
LV Mass 2D Index: 124.3 g/m2 — AB (ref 43–95)
LV Mass 2D: 324.4 g — AB (ref 67–162)
LV RWT Ratio: 0.68
LVIDd Index: 1.8 cm/m2
LVIDd: 4.7 cm (ref 3.9–5.3)
LVIDs Index: 1.38 cm/m2
LVIDs: 3.6 cm
LVOT Area: 3.5 cm2
LVOT Diameter: 2.1 cm
LVOT Mean Gradient: 2 mmHg
LVOT Peak Gradient: 3 mmHg
LVOT Peak Velocity: 0.9 m/s
LVOT SV: 72.4 ml
LVOT Stroke Volume Index: 27.7 mL/m2
LVOT VTI: 20.9 cm
LVOT:AV VTI Index: 0.25
LVPWd: 1.6 cm — AB (ref 0.6–0.9)
Left Ventricular Ejection Fraction: 68
MV A Velocity: 0.82 m/s
MV E Velocity: 1.13 m/s
MV E Wave Deceleration Time: 183.1 ms
MV E/A: 1.38
PV Max Velocity: 1.1 m/s
PV Peak Gradient: 5 mmHg
RVOT Peak Gradient: 3 mmHg
RVOT Peak Velocity: 0.9 m/s
RVSP: 28 mmHg
TR Max Velocity: 2.26 m/s
TR Peak Gradient: 22 mmHg

## 2020-11-28 LAB — PROTEIN / CREATININE RATIO, URINE
Creatinine, Ur: 27 mg/dL
Protein, Urine, Random: 6 mg/dL (ref 0.0–11.9)
Protein/Creat Ratio: 0.2

## 2020-11-28 LAB — POCT GLUCOSE
POC Glucose: 173 mg/dL — ABNORMAL HIGH (ref 65–117)
POC Glucose: 216 mg/dL — ABNORMAL HIGH (ref 65–117)
POC Glucose: 139 mg/dL — ABNORMAL HIGH (ref 65–117)
POC Glucose: 112 mg/dL (ref 65–117)

## 2020-11-28 MED ORDER — IPRATROPIUM-ALBUTEROL 2.5 MG-0.5 MG/3 ML NEB SOLUTION
2.5 mg-0.5 mg/3 ml | RESPIRATORY_TRACT | Status: AC
Start: 2020-11-28 — End: 2020-11-28
  Administered 2020-11-29: 01:00:00 via RESPIRATORY_TRACT

## 2020-11-28 MED ORDER — MORPHINE 2 MG/ML INJECTION
2 mg/mL | INTRAMUSCULAR | Status: AC | PRN
Start: 2020-11-28 — End: ?

## 2020-11-28 MED ORDER — BISACODYL 5 MG TAB, DELAYED RELEASE
5 mg | Freq: Two times a day (BID) | ORAL | Status: AC
Start: 2020-11-28 — End: ?
  Administered 2020-11-29: 12:00:00 via ORAL

## 2020-11-28 MED ORDER — PERFLUTREN LIPID MICROSPHERES 1.1 MG/ML IV
1.1 mg/mL | Freq: Once | INTRAVENOUS | Status: AC
Start: 2020-11-28 — End: 2020-11-28
  Administered 2020-11-28: 19:00:00 via INTRAVENOUS

## 2020-11-28 MED ORDER — OXYCODONE 5 MG TAB
5 mg | ORAL | Status: AC | PRN
Start: 2020-11-28 — End: ?
  Administered 2020-11-29 – 2020-12-03 (×9): via ORAL

## 2020-11-28 MED ORDER — SUCRALFATE 1 GRAM TAB
1 gram | Freq: Three times a day (TID) | ORAL | Status: AC
Start: 2020-11-28 — End: ?
  Administered 2020-11-28 – 2020-12-03 (×16): via ORAL

## 2020-11-28 MED ORDER — HEPARIN (PORCINE) 5,000 UNIT/ML IJ SOLN
5000 unit/mL | Freq: Three times a day (TID) | INTRAMUSCULAR | Status: AC
Start: 2020-11-28 — End: 2020-11-30
  Administered 2020-11-28 – 2020-12-01 (×10): via SUBCUTANEOUS

## 2020-11-28 MED ORDER — LEVOTHYROXINE 112 MCG TAB
112 mcg | Freq: Every day | ORAL | Status: AC
Start: 2020-11-28 — End: ?
  Administered 2020-11-29 – 2020-12-03 (×5): via ORAL

## 2020-11-28 MED ORDER — TRAZODONE 100 MG TAB
100 mg | Freq: Every evening | ORAL | Status: AC
Start: 2020-11-28 — End: ?
  Administered 2020-11-28 – 2020-12-03 (×6): via ORAL

## 2020-11-28 MED ORDER — CITALOPRAM 20 MG TAB
20 mg | Freq: Every day | ORAL | Status: AC
Start: 2020-11-28 — End: ?
  Administered 2020-11-29 – 2020-12-03 (×5): via ORAL

## 2020-11-28 MED ADMIN — gabapentin (NEURONTIN) capsule 300 mg: ORAL | @ 22:00:00 | NDC 00904666661

## 2020-11-28 MED ADMIN — montelukast (SINGULAIR) tablet 10 mg: ORAL | @ 04:00:00 | NDC 00904680861

## 2020-11-28 MED ADMIN — bumetanide (BUMEX) injection 2 mg: INTRAVENOUS | @ 21:00:00 | NDC 00641600701

## 2020-11-28 MED ADMIN — bumetanide (BUMEX) injection 2 mg: INTRAVENOUS | @ 04:00:00 | NDC 00641600801

## 2020-11-28 MED ADMIN — vancomycin (VANCOCIN) 1,000 mg in 0.9% sodium chloride 250 mL (Vial2Bag): INTRAVENOUS | @ 01:00:00 | NDC 63323028442

## 2020-11-28 MED ADMIN — sodium chloride (NS) flush 5-40 mL: INTRAVENOUS | @ 10:00:00 | NDC 82903065462

## 2020-11-28 MED ADMIN — gabapentin (NEURONTIN) capsule 300 mg: ORAL | @ 14:00:00 | NDC 00904666661

## 2020-11-28 MED ADMIN — gabapentin (NEURONTIN) capsule 300 mg: ORAL | @ 04:00:00 | NDC 00904666661

## 2020-11-28 MED ADMIN — insulin lispro (HUMALOG) injection: SUBCUTANEOUS | @ 04:00:00 | NDC 00002751017

## 2020-11-28 MED ADMIN — bumetanide (BUMEX) injection 2 mg: INTRAVENOUS | @ 14:00:00 | NDC 00641600801

## 2020-11-28 MED ADMIN — sodium chloride (NS) flush 5-40 mL: INTRAVENOUS | @ 21:00:00 | NDC 87701099893

## 2020-11-28 MED FILL — HEPARIN (PORCINE) 5,000 UNIT/ML IJ SOLN: 5000 unit/mL | INTRAMUSCULAR | Qty: 1

## 2020-11-28 MED FILL — BISACODYL 5 MG TAB, DELAYED RELEASE: 5 mg | ORAL | Qty: 2

## 2020-11-28 MED FILL — BD POSIFLUSH NORMAL SALINE 0.9 % INJECTION SYRINGE: INTRAMUSCULAR | Qty: 40

## 2020-11-28 MED FILL — SUCRALFATE 1 GRAM TAB: 1 gram | ORAL | Qty: 1

## 2020-11-28 MED FILL — GABAPENTIN 300 MG CAP: 300 mg | ORAL | Qty: 1

## 2020-11-28 MED FILL — MONTELUKAST 10 MG TAB: 10 mg | ORAL | Qty: 1

## 2020-11-28 MED FILL — BUMETANIDE 0.25 MG/ML IJ SOLN: 0.25 mg/mL | INTRAMUSCULAR | Qty: 10

## 2020-11-28 MED FILL — IPRATROPIUM-ALBUTEROL 2.5 MG-0.5 MG/3 ML NEB SOLUTION: 2.5 mg-0.5 mg/3 ml | RESPIRATORY_TRACT | Qty: 3

## 2020-11-28 MED FILL — BUMETANIDE 0.25 MG/ML IJ SOLN: 0.25 mg/mL | INTRAMUSCULAR | Qty: 8

## 2020-11-28 MED FILL — INSULIN LISPRO 100 UNIT/ML INJECTION: 100 unit/mL | SUBCUTANEOUS | Qty: 1

## 2020-11-28 MED FILL — PIPERACILLIN-TAZOBACTAM 3.375 GRAM IV SOLR: 3.375 gram | INTRAVENOUS | Qty: 3.38

## 2020-11-28 MED FILL — LEVOTHYROXINE 25 MCG TAB: 25 mcg | ORAL | Qty: 1

## 2020-11-28 MED FILL — CITALOPRAM 20 MG TAB: 20 mg | ORAL | Qty: 2

## 2020-11-28 MED FILL — TRAZODONE 50 MG TAB: 50 mg | ORAL | Qty: 2

## 2020-11-28 MED FILL — DEFINITY 1.1 MG/ML INTRAVENOUS SUSPENSION: 1.1 mg/mL | INTRAVENOUS | Qty: 2

## 2020-11-28 NOTE — Progress Notes (Signed)
Progress Note  Date:11/28/2020       Room:ER21/21  Patient Name:Deborah [name]     Date of Birth:1955/11/05     Age:65 y.o.      Subjective    Subjective:  Symptoms:  Stable.    Diet:  No nausea or vomiting.    Pain:  She complains of pain that is mild.  She reports pain is unchanged.     Review of Systems   Constitutional:  Negative for appetite change, fatigue and fever.   Gastrointestinal:  Positive for abdominal pain and constipation. Negative for blood in stool, nausea and vomiting.   Skin:  Negative for pallor.   Objective         Vitals Last 24 Hours:  TEMPERATURE:  Temp  Avg: 99.2 ??F (37.3 ??C)  Min: 99 ??F (37.2 ??C)  Max: 99.4 ??F (37.4 ??C)  RESPIRATIONS RANGE: Resp  Avg: 18.1  Min: 13  Max: 28  PULSE OXIMETRY RANGE: SpO2  Avg: 97.7 %  Min: 82 %  Max: 100 %  PULSE RANGE: Pulse  Avg: 84  Min: 80  Max: 110  BLOOD PRESSURE RANGE: Systolic (95MWU), XLK:440 , Min:63 , NUU:725   ; Diastolic (36UYQ), IHK:74, Min:44, Max:102    I/O (24Hr):    Intake/Output Summary (Last 24 hours) at 11/28/2020 0946  Last data filed at 11/28/2020 0630  Gross per 24 hour   Intake 350 ml   Output 1700 ml   Net -1350 ml     Objective:  General Appearance:  Comfortable and not in pain (Morbidly obese).    Vital signs: (most recent): Blood pressure (!) 87/46, pulse 84, temperature 99 ??F (37.2 ??C), resp. rate 17, weight (!) 165.6 kg (365 lb), SpO2 96 %.  No fever.    Output: No stool output.    HEENT: Normal HEENT exam.    Lungs:  Normal effort and normal respiratory rate.  Breath sounds clear to auscultation.    Heart: Normal rate.  Regular rhythm.  S1 normal and S2 normal.  No murmur.   Abdomen: Abdomen is soft and non-distended.  Bowel sounds are normal.   (Mid-abdominal TTP, mild).     Extremities: Normal range of motion.  There is no dependent edema.    Pulses: Distal pulses are intact.    Neurological: Patient is alert and oriented to person, place and time.    Pupils:  Pupils are equal, round, and reactive to  light.    Skin:  Warm and dry.    Labs/Imaging/Diagnostics    Labs:  CBC:  Recent Labs     11/28/20  0734 11/27/20  1313   WBC 8.9 10.5   RBC 3.72* 3.71*   HGB 11.4* 11.6   HCT 37.2 36.2   MCV 100.0* 97.6   RDW 14.0 13.7   PLT 57* 75*     CHEMISTRIES:  Recent Labs     11/28/20  0734 11/27/20  1313   NA 142 143   K 4.0 4.6   CL 103 105   CO2 34* 36*   BUN 22* 25*   CA 8.1* 8.8   PT/INR:  Recent Labs     11/27/20  1314   INR 1.1     APTT:No results for input(s): APTT in the last 72 hours.  LIVER PROFILE:  Recent Labs     11/28/20  0734 11/27/20  1313   AST 11* 13*   ALT 11* 13     Lab Results  Component Value Date/Time    ALT (SGPT) 11 (L) 11/28/2020 07:34 AM    AST (SGOT) 11 (L) 11/28/2020 07:34 AM    Alk. phosphatase 56 11/28/2020 07:34 AM    Bilirubin, total 3.5 (H) 11/28/2020 07:34 AM       Imaging Last 24 Hours:  CTA CHEST W OR W WO CONT    Result Date: 11/27/2020  CLINICAL HISTORY: Chest pain, dyspnea, anasarca INDICATION:  Chest pain, dyspnea, anasarca COMPARISON:  08/26/2020 TECHNIQUE: Following the uneventful intravenous administration of 100 cc Isovue-370, thin axial images were obtained through the chest, abdomen and pelvis. Coronal and sagittal reconstructions were generated. Oral contrast was not administered. CT dose reduction was achieved through use of a standardized protocol tailored for this examination and automatic exposure control for dose modulation. Adaptive statistical iterative reconstruction (ASIR) was utilized. For the chest portion of the examination only; 3-D MIP reconstructed imaging was obtained. FINDINGS: VASCULATURE: There is aortic atherosclerotic change. 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 There is mild cardiomegaly. Mild atherosclerotic changes the origin of the celiac and SMA. No aortic aneurysm or dissection. No proximal pulmonary embolism is identified. MEDIASTINUM/HEART: Cardiomegaly.  No hilar or mediastinal lymphadenopathy. No  esophageal mass. No endotracheal or endobronchial mass. PLEURA/LUNGS: Trace right-sided effusion with right basilar atelectasis. Small hiatal hernia. No nodule, mass, or airspace disease. There is no pleural or pericardial effusion. SOFT TISSUE/ AXILLA: No axillary adenopathy. No chest wall mass. LIVER/GALLBLADDER: Hepatic steatosis with nodular contour suggesting cirrhotic change. Cholecystectomy. There is no intrahepatic duct dilatation. The CBD is not dilated. There is no hepatic parenchymal mass. Hepatic enhancement pattern is within normal limits. The portal vein is patent. SPLEEN/PANCREAS: No mass.. There is no pancreatic mass. There is no pancreatic duct dilatation. There is no evidence of splenomegaly. ADRENALS/KIDNEYS: Left adrenal nodule not characterized unchanged 10 x 12 mm 4-32. There is no adrenal mass. There is no hydroureter or hydronephrosis. Punctate nonobstructive right renal calculus There is no perinephric mass. No ureteral or bladder calculus. STOMACH, COLON AND SMALL BOWEL: There is ascending colonic wall thickening. There is transverse colonic wall thickening. There is no obstruction or free intraperitoneal air. There is no evidence of incarceration or obstruction. Colonic diverticulosis. No mesenteric adenopathy. APPENDIX: Not visualized PERITONEUM/RETROPERITONEUM: There is no abdominal aortic aneurysm. No significant lymphadenopathy. IVC filter in place. URINARY BLADDER: No mass or calculus. PELVIS: There is no pelvic adenopathy. There is no pelvic sidewall mass. There is no inguinal adenopathy. BONES: Multilevel severe canal and foraminal stenosis. Vacuum disc phenomena.. No lytic or blastic lesions. No evidence of fracture or dislocation.     Findings suggestive of a moderate colitis affecting the ascending and proximal transverse colon. Consider infectious, inflammatory and ischemic etiologies. Hepatic steatosis and cirrhotic change with cholecystectomy. Cardiomegaly. Trace right-sided  pleural effusion is new. No evidence of pulmonary embolism. No aortic aneurysm or dissection. No additional acute process is identified in the chest, abdomen or pelvis.     CT ABD PELV W CONT    Result Date: 11/27/2020  CLINICAL HISTORY: Chest pain, dyspnea, anasarca INDICATION:  Chest pain, dyspnea, anasarca COMPARISON:  08/26/2020 TECHNIQUE: Following the uneventful intravenous administration of 100 cc Isovue-370, thin axial images were obtained through the chest, abdomen and pelvis. Coronal and sagittal reconstructions were generated. Oral contrast was not administered. CT dose reduction was achieved through use of a standardized protocol tailored for this examination and automatic exposure control for dose modulation. Adaptive statistical iterative reconstruction (ASIR) was  utilized. For the chest portion of the examination only; 3-D MIP reconstructed imaging was obtained. FINDINGS: VASCULATURE: There is aortic atherosclerotic change. 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 There is mild cardiomegaly. Mild atherosclerotic changes the origin of the celiac and SMA. No aortic aneurysm or dissection. No proximal pulmonary embolism is identified. MEDIASTINUM/HEART: Cardiomegaly.  No hilar or mediastinal lymphadenopathy. No esophageal mass. No endotracheal or endobronchial mass. PLEURA/LUNGS: Trace right-sided effusion with right basilar atelectasis. Small hiatal hernia. No nodule, mass, or airspace disease. There is no pleural or pericardial effusion. SOFT TISSUE/ AXILLA: No axillary adenopathy. No chest wall mass. LIVER/GALLBLADDER: Hepatic steatosis with nodular contour suggesting cirrhotic change. Cholecystectomy. There is no intrahepatic duct dilatation. The CBD is not dilated. There is no hepatic parenchymal mass. Hepatic enhancement pattern is within normal limits. The portal vein is patent. SPLEEN/PANCREAS: No mass.. There is no pancreatic mass. There is no pancreatic  duct dilatation. There is no evidence of splenomegaly. ADRENALS/KIDNEYS: Left adrenal nodule not characterized unchanged 10 x 12 mm 4-32. There is no adrenal mass. There is no hydroureter or hydronephrosis. Punctate nonobstructive right renal calculus There is no perinephric mass. No ureteral or bladder calculus. STOMACH, COLON AND SMALL BOWEL: There is ascending colonic wall thickening. There is transverse colonic wall thickening. There is no obstruction or free intraperitoneal air. There is no evidence of incarceration or obstruction. Colonic diverticulosis. No mesenteric adenopathy. APPENDIX: Not visualized PERITONEUM/RETROPERITONEUM: There is no abdominal aortic aneurysm. No significant lymphadenopathy. IVC filter in place. URINARY BLADDER: No mass or calculus. PELVIS: There is no pelvic adenopathy. There is no pelvic sidewall mass. There is no inguinal adenopathy. BONES: Multilevel severe canal and foraminal stenosis. Vacuum disc phenomena.. No lytic or blastic lesions. No evidence of fracture or dislocation.     Findings suggestive of a moderate colitis affecting the ascending and proximal transverse colon. Consider infectious, inflammatory and ischemic etiologies. Hepatic steatosis and cirrhotic change with cholecystectomy. Cardiomegaly. Trace right-sided pleural effusion is new. No evidence of pulmonary embolism. No aortic aneurysm or dissection. No additional acute process is identified in the chest, abdomen or pelvis.    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 female who is seen in consultation for abdominal pain, abnormal CT.  Ms. Yoon has a myriad of medical problems; she states she is moved here about a year ago from [place] where she had her GI evaluation.  GI evaluation included colonoscopy; her understanding is nothing was found.  She has been told of liver cirrhosis; the only diagnosis linked to cirrhosis is her morbid obesity.    She presents to the hospital with abdominal pain, chest pain, difficulty breathing; CT scan is abnormal with identification of colonic wall thickening.  She does  not have diarrhea; in fact she has constipation with a couple of bowel movements per week.  She has not had vomiting, visible blood loss, fever.    11/28/2020: Cardiology consult and echocardiogram ordered.  She has ongoing mid abdominal tenderness to palpation.  Denies any significant rectal bleeding, has chronic constipation.  Reports having had an EGD at the same time as her colonoscopy a few years ago in [place], both of them were normal per her report.).     Plan:    (- Will hold off on colonoscopy until cardiology evaluation complete).     Electronically signed by Joesph July, NP on 11/28/2020 at 9:46 AM  Will give daily bisacodyl until such time as cardiopulmonary status and optimal condition for colonoscopy exam.    I have interviewed and examined patient with addendum to note above and formulation care plan to reflect my evaluation    Rachael Darby, M.D.

## 2020-11-28 NOTE — Progress Notes (Signed)
Reason to see patient: CHF        HPI: Deborah [name] is a 65 y.o. female With past medical history significant for aortic valve replacement, CKD, hypertension, CAD s/p PCI, permanent pacemaker who is admitted with symptoms of increasing shortness of breath from last 3 to 4 days.  Patient noticed increasing abdominal girth and lower extremity edema despite taking diuretics which were not working from last 2 weeks.  Denies any symptoms of chest pain, palpitations, lightheadedness or dizziness.    EKG at the time of presentation demonstrated intermittent paced rhythm and PVCs in the form of bigeminy.    Troponin are negative, NT proBNP is 2198.    Plan:    1.  Acute heart failure preserved EF: Unclear etiology.  We will continue diuresis.  Previous echocardiogram earlier this year demonstrated normal EF.  Current echocardiogram is pending.    2.  History of aortic valve replacement: Most recent echocardiogram demonstrated normally functioning aortic valve.    3.  CKD: Probably also playing a role for fluid retention.    4.  Morbid obesity: Contributing to shortness of breath and heart failure preserved EF.    5.  Sleep apnea: Continue CPAP.    6.  Permanent pacemaker: Functioning well.        ATTENTION:   This medical record was transcribed using an electronic medical records/speech recognition system.  Although proofread, it may and can contain electronic, spelling and other errors.  Corrections may be executed at a later time.  Please feel free to contact us for any clarifications as needed.      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           Family History   Problem Relation Age of Onset    Hypertension Mother     Hypertension Father            Social History     Socioeconomic History    Marital status: SINGLE     Spouse name: Not on file    Number of children: Not on file    Years of education: Not on file    Highest education level: Not on file   Occupational History    Not on file   Tobacco Use    Smoking status: Former     Packs/day: 1.00     Years: 20.00     Pack years: 20.00     Types: Cigarettes     Quit date: 03/21/2010     Years since quitting: 10.6    Smokeless tobacco: Never   Vaping Use    Vaping Use: Never used   Substance and Sexual Activity    Alcohol use: Not Currently    Drug use: Never    Sexual activity: Not on file   Other Topics Concern    Not on  file   Social History Narrative    Not on file     Social Determinants of Health     Financial Resource Strain: Not on file   Food Insecurity: Not on file   Transportation Needs: Not on file   Physical Activity: Not on file   Stress: Not on file   Social Connections: Not on file   Intimate Partner Violence: Not on file   Housing Stability: Not on file         Allergies   Allergen Reactions    Nitroglycerin Unknown (comments)     hypotension    Aloe Vera Rash    Hydrochlorothiazide Other (comments)     Reports 'kidneys dry up"     Tetanus And Diphther. Tox (Pf) Swelling     Swelling of arm and it turns black            Current Facility-Administered Medications   Medication Dose Route Frequency Provider Last Rate Last Admin    citalopram (CELEXA) tablet 40 mg  40 mg Oral DAILY Tefera, Mesfin, MD        gabapentin (NEURONTIN) capsule 300 mg  300 mg Oral TID Servando Salina, Mesfin, MD   300 mg at 11/28/20 0004    levothyroxine (SYNTHROID) tablet 137 mcg  137 mcg Oral ACB Tefera, Mesfin, MD        montelukast (SINGULAIR) tablet 10 mg  10 mg Oral QHS Tefera, Mesfin, MD   10 mg at 11/28/20 0004    sucralfate (CARAFATE) tablet 1 g  1 g Oral TID Servando Salina, Mesfin, MD        traZODone (DESYREL) tablet  100 mg  100 mg Oral QHS Tefera, Mesfin, MD   100 mg at 11/28/20 0004    sodium chloride (NS) flush 5-40 mL  5-40 mL IntraVENous Q8H Tefera, Mesfin, MD        sodium chloride (NS) flush 5-40 mL  5-40 mL IntraVENous PRN Tefera, Mesfin, MD        acetaminophen (TYLENOL) tablet 650 mg  650 mg Oral Q6H PRN Tefera, Mesfin, MD        Or    acetaminophen (TYLENOL) suppository 650 mg  650 mg Rectal Q6H PRN Tefera, Mesfin, MD        polyethylene glycol (MIRALAX) packet 17 g  17 g Oral DAILY PRN Tefera, Mesfin, MD        ondansetron (ZOFRAN ODT) tablet 4 mg  4 mg Oral Q8H PRN Tefera, Mesfin, MD        Or    ondansetron (ZOFRAN) injection 4 mg  4 mg IntraVENous Q6H PRN Tefera, Mesfin, MD        heparin (porcine) injection 5,000 Units  5,000 Units SubCUTAneous Q8H Tefera, Mesfin, MD   5,000 Units at 11/28/20 0733    bumetanide (BUMEX) injection 2 mg  2 mg IntraVENous BID Tefera, Mesfin, MD   2 mg at 11/28/20 0005    piperacillin-tazobactam (ZOSYN) 3.375 g in 0.9% sodium chloride (MBP/ADV) 100 mL MBP  3.375 g IntraVENous Q8H Tefera, Mesfin, MD 25 mL/hr at 11/28/20 0732 3.375 g at 11/28/20 0732    insulin lispro (HUMALOG) injection   SubCUTAneous AC&HS Servando Salina, Mesfin, MD   2 Units at 11/28/20 0018    glucose chewable tablet 16 g  4 Tablet Oral PRN Tefera, Mesfin, MD        glucagon (GLUCAGEN) injection 1 mg  1 mg IntraMUSCular PRN Cyndra Numbers, MD        dextrose  10% infusion 0-250 mL  0-250 mL IntraVENous PRN Tefera, Mesfin, MD        albuterol-ipratropium (DUO-NEB) 2.5 MG-0.5 MG/3 ML  3 mL Nebulization Q6H RT Tefera, Mesfin, MD   3 mL at 11/28/20 0751    bisacodyL (DULCOLAX) tablet 10 mg  10 mg Oral Margarito Courser, MD         Current Outpatient Medications   Medication Sig Dispense Refill    lidocaine (Lidoderm) 5 % Apply patch to the affected area for 12 hours a day and remove for 12 hours a day. 30 Each 0    metoprolol succinate (TOPROL-XL) 50 mg XL tablet TAKE 1 TABLET BY MOUTH EVERY DAY 90 Tablet 1    acetaminophen  (TYLENOL) 500 mg tablet Take 1,000 mg by mouth every six (6) hours as needed for Pain.      albuterol (PROVENTIL HFA, VENTOLIN HFA, PROAIR HFA) 90 mcg/actuation inhaler Take 2 Puffs by inhalation every six (6) hours as needed.      atorvastatin (LIPITOR) 80 mg tablet       calcium-cholecalciferol, D3, (CALTRATE 600+D) tablet Take 1 Tablet by mouth nightly.      cholecalciferol (VITAMIN D3) (1000 Units /25 mcg) tablet Take 1,000 Units by mouth daily.      citalopram (CELEXA) 40 mg tablet Take 40 mg by mouth daily.      fluticasone propionate (FLONASE) 50 mcg/actuation nasal spray 2 Sprays by Nasal route daily as needed.      gabapentin (NEURONTIN) 600 mg tablet TAKE 1 TABLET BY MOUTH 4 TIMES A DAY      levothyroxine (SYNTHROID) 137 mcg tablet TAKE 1 BY MOUTH DAILY, FOLLOW UP IN 90 DAYS FOR RECHECK THYROID LEVEL      sucralfate (CARAFATE) 1 gram tablet Take 1 g by mouth three (3) times daily.      torsemide (DEMADEX) 20 mg tablet TAKE 1 BY MOUTH DAILY F/U WITH CARDIOLOGY      traZODone (DESYREL) 100 mg tablet Take 100 mg by mouth nightly.      EPINEPHrine (EPIPEN) 0.3 mg/0.3 mL injection       Xtampza ER 9 mg capsule TAKE 1 CAPSULE BY MOUTH EVERY 12 HOURS FILL 02/12/20      glimepiride (AMARYL) 2 mg tablet Take 2 mg by mouth two (2) times a day.      montelukast (SINGULAIR) 10 mg tablet Take 10 mg by mouth nightly.          ROS:  12 point review of systems was performed. All negative except for HPI     Physical Exam:  Visit Vitals  BP (!) 87/46   Pulse 84   Temp 99 ??F (37.2 ??C)   Resp 17   Wt (!) 365 lb (165.6 kg)   SpO2 96%   BMI 57.17 kg/m??       Gen:  Well-developed, morbidly obese with mild respiratory distress.  HEENT:  Pink conjunctivae, PERRL, hearing intact to voice, moist mucous membranes  Neck:  Supple, without masses, thyroid non-tender  Resp:  No accessory muscle use, clear breath sounds without wheezes rales or rhonchi  Card: Ejection systolic murmur grade 3/6 in aortic region.  Normal S1, S2 without  thrills, bruits.  1+ nonpitting pedal edema.  Abd:  Soft, non-tender, non-distended, normoactive bowel sounds are present, no palpable organomegaly and no detectable hernias  Lymph:  No cervical or inguinal adenopathy  Musc:  No cyanosis or clubbing  Skin:  No rashes or ulcers, skin turgor  is good  Neuro:  Cranial nerves are grossly intact, no focal motor weakness, follows commands appropriately  Psych:  Good insight, oriented to person, place and time, alert     Labs:     Lab Results   Component Value Date/Time    WBC 8.9 11/28/2020 07:34 AM    HGB 11.4 (L) 11/28/2020 07:34 AM    HCT 37.2 11/28/2020 07:34 AM    PLATELET 57 (L) 11/28/2020 07:34 AM    MCV 100.0 (H) 11/28/2020 07:34 AM     Lab Results   Component Value Date/Time    Hemoglobin A1c 6.1 (H) 04/30/2020 12:57 AM    Glucose 131 (H) 11/28/2020 07:34 AM    Glucose (POC) 173 (H) 11/28/2020 12:13 AM    LDL, calculated 12.6 04/30/2020 12:57 AM    Creatinine 1.42 (H) 11/28/2020 07:34 AM      Lab Results   Component Value Date/Time    Cholesterol, total 106 04/30/2020 12:57 AM    HDL Cholesterol 76 04/30/2020 12:57 AM    LDL, calculated 12.6 04/30/2020 12:57 AM    Triglyceride 87 04/30/2020 12:57 AM    CHOL/HDL Ratio 1.4 04/30/2020 12:57 AM     Lab Results   Component Value Date/Time    ALT (SGPT) 11 (L) 11/28/2020 07:34 AM    Alk. phosphatase 56 11/28/2020 07:34 AM    Bilirubin, total 3.5 (H) 11/28/2020 07:34 AM    Albumin 3.2 (L) 11/28/2020 07:34 AM    Protein, total 6.1 (L) 11/28/2020 07:34 AM    INR 1.1 11/27/2020 01:14 PM    Prothrombin time 11.8 (H) 11/27/2020 01:14 PM    PLATELET 57 (L) 11/28/2020 07:34 AM     Lab Results   Component Value Date/Time    INR 1.1 11/27/2020 01:14 PM    INR 1.1 04/30/2020 12:51 AM    Prothrombin time 11.8 (H) 11/27/2020 01:14 PM    Prothrombin time 11.6 (H) 04/30/2020 12:51 AM      Lab Results   Component Value Date/Time    GFR est non-AA 46 (L) 08/26/2020 04:25 PM    GFR est AA 56 (L) 08/26/2020 04:25 PM    Creatinine 1.42 (H)  11/28/2020 07:34 AM    BUN 22 (H) 11/28/2020 07:34 AM    Sodium 142 11/28/2020 07:34 AM    Potassium 4.0 11/28/2020 07:34 AM    Chloride 103 11/28/2020 07:34 AM    CO2 34 (H) 11/28/2020 07:34 AM    Magnesium 1.5 (L) 04/29/2020 07:29 PM     No results found for: PSA, Stephania Fragmin, PSAR3, [id number], [id number]  Lab Results   Component Value Date/Time    TSH 4.29 (H) 11/28/2020 07:34 AM      Lab Results   Component Value Date/Time    Glucose 131 (H) 11/28/2020 07:34 AM    Glucose (POC) 173 (H) 11/28/2020 12:13 AM      No results found for: CPK, RCK1, RCK2, RCK3, RCK4, CKMB, CKNDX, CKND1, TROPT, TROIQ, BNPP, BNP   Lab Results   Component Value Date/Time    NT pro-BNP 2,198 (H) 11/27/2020 01:13 PM    NT pro-BNP 1,425 (H) 11/05/2020 01:28 PM    NT pro-BNP 153 (H) 04/29/2020 07:29 PM      Lab Results   Component Value Date/Time    Sodium 142 11/28/2020 07:34 AM    Potassium 4.0 11/28/2020 07:34 AM    Chloride 103 11/28/2020 07:34 AM    CO2 34 (H) 11/28/2020 07:34 AM  Anion gap 5 11/28/2020 07:34 AM    Glucose 131 (H) 11/28/2020 07:34 AM    BUN 22 (H) 11/28/2020 07:34 AM    Creatinine 1.42 (H) 11/28/2020 07:34 AM    BUN/Creatinine ratio 15 11/28/2020 07:34 AM    GFR est AA 56 (L) 08/26/2020 04:25 PM    GFR est non-AA 46 (L) 08/26/2020 04:25 PM    Calcium 8.1 (L) 11/28/2020 07:34 AM      Lab Results   Component Value Date/Time    Sodium 142 11/28/2020 07:34 AM    Potassium 4.0 11/28/2020 07:34 AM    Chloride 103 11/28/2020 07:34 AM    CO2 34 (H) 11/28/2020 07:34 AM    Anion gap 5 11/28/2020 07:34 AM    Glucose 131 (H) 11/28/2020 07:34 AM    BUN 22 (H) 11/28/2020 07:34 AM    Creatinine 1.42 (H) 11/28/2020 07:34 AM    BUN/Creatinine ratio 15 11/28/2020 07:34 AM    GFR est AA 56 (L) 08/26/2020 04:25 PM    GFR est non-AA 46 (L) 08/26/2020 04:25 PM    Calcium 8.1 (L) 11/28/2020 07:34 AM    Bilirubin, total 3.5 (H) 11/28/2020 07:34 AM    ALT (SGPT) 11 (L) 11/28/2020 07:34 AM    Alk. phosphatase 56 11/28/2020  07:34 AM    Protein, total 6.1 (L) 11/28/2020 07:34 AM    Albumin 3.2 (L) 11/28/2020 07:34 AM    Globulin 2.9 11/28/2020 07:34 AM    A-G Ratio 1.1 11/28/2020 07:34 AM      Lab Results   Component Value Date/Time    Hemoglobin A1c 6.1 (H) 04/30/2020 12:57 AM         No results for input(s): CPK, CKMB, TROIQ in the last 72 hours.    No lab exists for component: CKQMB, CPKMB        Problem List:     Problem List  Date Reviewed: 2020-05-27            Codes Class Noted    CHF (congestive heart failure) ([place]) ICD-10-CM: I50.9  ICD-9-CM: 428.0  11/27/2020        Elevated bilirubin ICD-10-CM: R17  ICD-9-CM: 277.4  11/27/2020        Liver cirrhosis ([place]) ICD-10-CM: K74.60  ICD-9-CM: 571.5  11/27/2020        Colitis ICD-10-CM: K52.9  ICD-9-CM: 558.9  11/27/2020        Thrombocytopenia ([place]) ICD-10-CM: D69.6  ICD-9-CM: 287.5  04/30/2020        Morbid obesity ([place]) ICD-10-CM: E66.01  ICD-9-CM: 278.01  04/30/2020        CKD (chronic kidney disease), stage III [facility]) ICD-10-CM: N18.30  ICD-9-CM: 585.3  04/30/2020        Aortic valve replaced ICD-10-CM: Z95.2  ICD-9-CM: V43.3  Unknown        Pacemaker ICD-10-CM: Z95.0  ICD-9-CM: V45.01  Unknown        Hypothyroid ICD-10-CM: E03.9  ICD-9-CM: 244.9  Unknown        Asthma ICD-10-CM: J45.909  ICD-9-CM: 493.90  Unknown        Anxiety and depression ICD-10-CM: F41.9, F32.A  ICD-9-CM: 300.00, 311  Unknown        DM type 2 causing renal disease ([place]) ICD-10-CM: E11.29  ICD-9-CM: 250.40  Unknown        Chronic narcotic use ICD-10-CM: F11.90  ICD-9-CM: 305.50  Unknown        Chronic pain ICD-10-CM: G89.29  ICD-9-CM: 338.29  Unknown  Rhinitis ICD-10-CM: J31.0  ICD-9-CM: 472.0  Unknown        Hyperlipidemia ICD-10-CM: E78.5  ICD-9-CM: 272.4  Unknown        Neuropathy ICD-10-CM: G62.9  ICD-9-CM: 355.9  Unknown        GERD (gastroesophageal reflux disease) ICD-10-CM: K21.9  ICD-9-CM: 530.81  Unknown        Arm paresthesia, left ICD-10-CM: R20.2  ICD-9-CM: 782.0  04/29/2020             Braxon Suder K.  Marlane Hatcher, MD, [facility]

## 2020-11-28 NOTE — Consults (Signed)
Consult dict    Elevated Cr. Stable CKD stage 3b  Prior h/o AKI in 2015  Morbid obesity  Cirrhosis  Edema/sob  ?Colitis    Rec:  Watch Cr with diuresis/abx  Check urine PCR  Will follow

## 2020-11-28 NOTE — ED Notes (Signed)
Bedside shift change report given to Carla RN (oncoming nurse) by Paula RN (offgoing nurse). Report included the following information SBAR.

## 2020-11-28 NOTE — Consults (Signed)
See progress notes

## 2020-11-28 NOTE — Progress Notes (Signed)
Son at the bedside. Keeping on NPO. Pt has had 4 bm's. today

## 2020-11-28 NOTE — Progress Notes (Signed)
Ultrasound IV by Bonney Aid, RN :  Procedure Note    Patient meets criteria for [place] IV insertion.     Ultrasound IV education provided to patient. Opportunities for questions given.      Ultrasound used for PIV placement:  20gauge 8 cm Arrow Endurance Extended Dwell(may stay in place for 29 days)  R forearm location.  1 X Attempt(s).    Flushed with ease; vigorous blood return.     Procedure tolerated well. Primary RN aware of IV placement and added to LDA.      Bonney Aid, RN

## 2020-11-28 NOTE — Progress Notes (Signed)
1630 pt to room [id number].

## 2020-11-28 NOTE — Progress Notes (Signed)
Progress Notes by [name], MD at 11/28/20 [phone]                Author: [name], MD  Service: Internal Medicine  Author Type: Physician       Filed: 11/28/20 1848  Date of Service: 11/28/20 0711  Status: Signed          Editor: [name], MD (Physician)                    [facility]   [address] [place], [place]  [address]   [phone]             Hospitalist Progress Note            NAME:  Jocilynn Grade    DOB:  August 20, 1955    MRN:  [id number]      Date/Time:  11/28/2020       Patient PCP:  Arcola Jansky, NP      Emergency Contact:     Extended Emergency Contact Information   Primary Emergency Contact: [employer] Phone: [phone]   Mobile Phone: [phone]   Relation: Son   Interpreter needed? No        Code: Full Code       Isolation Precautions: There are currently no Active Isolations                Subjective:        REASON FOR VISIT:  Recheck SOB        HPI & INTERVAL HISTORY:      Ms. Zenker is a 65 y.o. female with history that includes DM, CKD, chronic pain, hypothyroidism, and morbid obesity presents with dyspnea, leg swelling, weight gain, and abdominal pain      11/01: Patient seen and examined.  Complains of mild to moderate generalized abdominal pain which is constant dull and achy without radiation.  Associated with nausea but no vomiting.  Has had loose bowel movements with this.  Still has a lot of edema/anasarca  but breathing is improving with diuresis.   Echo 11/28/20:   ?  Left Ventricle: Not well visualized. Normal left ventricular systolic function with a visually estimated EF of 65 - 70%. Not well visualized. Left ventricle  size is normal. Moderately increased wall thickness. Unable to assess wall motion.   ?  Right Ventricle: Not well visualized.   ?  Aortic Valve: Not well visualized. Bioprosthetic valve.  Suboptimal Doppler, however velocity > 3 m/sec appreciated across aortic valve.   ?   Technical qualifiers: Echo study was technically difficult with poor endocardial visualization and technically difficult due to patient's body habitus.   ?  Contrast used: Definity.      ALLERGIES     Allergies        Allergen  Reactions         ?  Nitroglycerin  Unknown (comments)             hypotension         ?  Aloe Vera  Rash     ?  Hydrochlorothiazide  Other (comments)             Reports 'kidneys dry up"          ?  Tetanus And Diphther. Tox (Pf)  Swelling             Swelling of arm and it turns black  ROS:   Gen:   Negative   Resp:  shortness of breath   CVS:   edema and DOE   GI:       abdominal pain, denies melena, and denies hematochezia   GU:     negative                  Objective:         Visit Vitals      BP  (!) 94/58     Pulse  88     Temp  99 ??F (37.2 ??C)     Resp  21     Ht   (1.702 m)     Wt  (!) 165.6 kg (365 lb)     SpO2  99%        BMI  57.17 kg/m??           Physical Exam:   General: alert, well appearing, no distress, and morbidly obese   Head: Normocephalic, without obvious abnormality, atraumatic   Eyes: PERRL, EOMI, anicteric sclerae, and conjuntiva clear   ENT: lips, mucosa, and tongue normal   Neck: normal, supple, and no tenderness   Lungs: clear to auscultation with good breath sounds and normal respiratory effort   Heart: S1, S2 normal, regular rate, regular rhythm, and systolic murmur present   Abd: not distended, soft, generalized tenderness to palpation, with guarding, no rebound, BS present and normactive   Ext: no cyanosis and bilateral lower extremity 2+ edema   Skin: normal skin color, no rashes, and texture normal   Neuro:  alert, oriented x 3, no defects noted in general exam.   Psych: not anxious, cooperative, appropriate affect         Medications:     Current Facility-Administered Medications          Medication  Dose  Route  Frequency           ?  citalopram (CELEXA) tablet 40 mg   40 mg  Oral  DAILY     ?  gabapentin (NEURONTIN) capsule 300 mg   300 mg   Oral  TID     ?  levothyroxine (SYNTHROID) tablet 137 mcg   137 mcg  Oral  ACB     ?  montelukast (SINGULAIR) tablet 10 mg   10 mg  Oral  QHS     ?  sucralfate (CARAFATE) tablet 1 g   1 g  Oral  TID     ?  traZODone (DESYREL) tablet 100 mg   100 mg  Oral  QHS     ?  sodium chloride (NS) flush 5-40 mL   5-40 mL  IntraVENous  Q8H     ?  sodium chloride (NS) flush 5-40 mL   5-40 mL  IntraVENous  PRN     ?  acetaminophen (TYLENOL) tablet 650 mg   650 mg  Oral  Q6H PRN          Or           ?  acetaminophen (TYLENOL) suppository 650 mg   650 mg  Rectal  Q6H PRN     ?  polyethylene glycol (MIRALAX) packet 17 g   17 g  Oral  DAILY PRN     ?  ondansetron (ZOFRAN ODT) tablet 4 mg   4 mg  Oral  Q8H PRN          Or           ?  ondansetron (ZOFRAN) injection 4 mg   4 mg  IntraVENous  Q6H PRN     ?  heparin (porcine) injection 5,000 Units   5,000 Units  SubCUTAneous  Q8H     ?  bumetanide (BUMEX) injection 2 mg   2 mg  IntraVENous  BID     ?  piperacillin-tazobactam (ZOSYN) 3.375 g in 0.9% sodium chloride (MBP/ADV) 100 mL MBP   3.375 g  IntraVENous  Q8H           ?  insulin lispro (HUMALOG) injection     SubCUTAneous  AC&HS           ?  glucose chewable tablet 16 g   4 Tablet  Oral  PRN     ?  glucagon (GLUCAGEN) injection 1 mg   1 mg  IntraMUSCular  PRN     ?  dextrose 10% infusion 0-250 mL   0-250 mL  IntraVENous  PRN     ?  albuterol-ipratropium (DUO-NEB) 2.5 MG-0.5 MG/3 ML   3 mL  Nebulization  Q6H RT           ?  bisacodyL (DULCOLAX) tablet 10 mg   10 mg  Oral  Q2H          Current Outpatient Medications        Medication  Sig         ?  lidocaine (Lidoderm) 5 %  Apply patch to the affected area for 12 hours a day and remove for 12 hours a day.     ?  metoprolol succinate (TOPROL-XL) 50 mg XL tablet  TAKE 1 TABLET BY MOUTH EVERY DAY     ?  acetaminophen (TYLENOL) 500 mg tablet  Take 1,000 mg by mouth every six (6) hours as needed for Pain.     ?  albuterol (PROVENTIL HFA, VENTOLIN HFA, PROAIR HFA) 90 mcg/actuation inhaler   Take 2 Puffs by inhalation every six (6) hours as needed.     ?  atorvastatin (LIPITOR) 80 mg tablet       ?  calcium-cholecalciferol, D3, (CALTRATE 600+D) tablet  Take 1 Tablet by mouth nightly.     ?  cholecalciferol (VITAMIN D3) (1000 Units /25 mcg) tablet  Take 1,000 Units by mouth daily.     ?  citalopram (CELEXA) 40 mg tablet  Take 40 mg by mouth daily.     ?  fluticasone propionate (FLONASE) 50 mcg/actuation nasal spray  2 Sprays by Nasal route daily as needed.     ?  gabapentin (NEURONTIN) 600 mg tablet  TAKE 1 TABLET BY MOUTH 4 TIMES A DAY     ?  levothyroxine (SYNTHROID) 137 mcg tablet  TAKE 1 BY MOUTH DAILY, FOLLOW UP IN 90 DAYS FOR RECHECK THYROID LEVEL     ?  sucralfate (CARAFATE) 1 gram tablet  Take 1 g by mouth three (3) times daily.     ?  torsemide (DEMADEX) 20 mg tablet  TAKE 1 BY MOUTH DAILY F/U WITH CARDIOLOGY     ?  traZODone (DESYREL) 100 mg tablet  Take 100 mg by mouth nightly.     ?  EPINEPHrine (EPIPEN) 0.3 mg/0.3 mL injection       ?  Xtampza ER 9 mg capsule  TAKE 1 CAPSULE BY MOUTH EVERY 12 HOURS FILL 02/12/20     ?  glimepiride (AMARYL) 2 mg tablet  Take 2 mg by mouth two (2) times a day.         ?  montelukast (SINGULAIR) 10 mg tablet  Take 10 mg by mouth nightly.            Labs:     Recent Labs           11/27/20   1313     WBC  10.5     HGB  11.6     HCT  36.2        PLT  75*             Recent Labs           11/27/20   1313     NA  143     K  4.6     CL  105     CO2  36*     GLU  126*     BUN  25*     CREA  1.55*     CA  8.8     ALB  3.3*     TBILI  2.7*        ALT  13                 Radiology:   CTA CHEST W OR W WO CONT      Result Date: 11/27/2020   Findings suggestive of a moderate colitis affecting the ascending and proximal transverse colon. Consider infectious, inflammatory and ischemic etiologies. Hepatic steatosis and cirrhotic change with cholecystectomy. Cardiomegaly. Trace right-sided pleural  effusion is new. No evidence of pulmonary embolism. No aortic aneurysm or  dissection. No additional acute process is identified in the chest, abdomen or pelvis.       CT ABD PELV W CONT      Result Date: 11/27/2020   Findings suggestive of a moderate colitis affecting the ascending and proximal transverse colon. Consider infectious, inflammatory and ischemic etiologies. Hepatic steatosis and cirrhotic change with cholecystectomy. Cardiomegaly. Trace right-sided pleural  effusion is new. No evidence of pulmonary embolism. No aortic aneurysm or dissection. No additional acute process is identified in the chest, abdomen or pelvis.     I personally reviewed and interpreted the imaging studies and agree with official reading      The chart, labs, imaging studies, medications, and consultants notes was reviewed by me on: November 28, 2020                  Assessment/Plan:         Acute respiratory failure with hypoxia due to acute HFpEF with exertional dyspnea, fluid overload/anasarca, weight gain, and elevated proBNP:    Unsure of trigger  Slight improvement   Continue diuresis with Bumex  Monitor daily weights and I's and O's   CT of the chest: Trace pleural effusion, no PE.     Continue supplemental oxygen to keep SaO2 greater than 90%.    Echocardiogram EF 65-70%   Cardiology consulted        Aortic valve replaced / Pacemaker:   Cardiology consulted.        Abdominal pain /possible colitis:   CT scan of the abdomen shows colitis.     Continue Zosyn   Continue bisacodyl   Add Roxicodone for moderate pain and morphine IV for severe pain   Full liquid diet     GI consulted and note reviewed (awaiting cardio eval)       DM type 2 causing renal disease:   A1c pending   Continue ISS      CKD stage III:  Avoid nephrotoxic drugs.  Watch Cr as pt was given contrast for CT scan.  Check urine lytes and consult nephrology       Liver cirrhosis / Elevated bilirubin    Likely d/t morbid obesity CT of the abdomen shows: Hepatic steatosis and cirrhotic change.     Avoid hepatotoxic drugs.  Holding statin  for now.       Thrombocytopenia:     Likely secondary to cirrhosis       Hypothyroidism:   Continue Synthroid. Check TSH        Hyperlipidemia:   On statin but will hold for now       Morbid obesity / OSA:    Uses CPAP.       Chronic pain:   Continue pain medicine.        F: No fluids   E: Monitor especially on diuretic   N: ADULT DIET Full Liquid; 4 carb choices (60 gm/meal); Low Sodium (2 gm)          Risk of deterioration: high        Discussed:  Pt's condition, Imaging findings, Lab findings, Assessment, Care Plan, and D/C Planning discussed with: Patient, Family at bedsude, RN, and Care Manager      Prophylaxis:  Hep SQ      Anticipated discharge disposition:  Home with family      Discharge barriers: HR DC Barriers: Currently not medically stable for discharge         Total time: 40 minutes **I personally saw and examined the patient during this time period**         Date of service:    11/28/2020                    ___________________________________________________      Admitting Physician: [name], MD

## 2020-11-28 NOTE — Progress Notes (Signed)
 Bedside and Verbal shift change report given to Darryle RN (oncoming nurse) by Bing PEAK (offgoing nurse). Report included the following information SBAR, Intake/Output, MAR, Recent Results, Cardiac Rhythm: NSR and Alarm Parameters .     Primary Nurse Darryle Rase, RN and Barbie, RN performed a dual skin assessment on this patient No impairment noted  Braden score is see flowsheets    See flowsheets for all assessments, see MAR for all medication administrations.    2330: notified MD that pt is in ventricular bigeminy. Looks like pt has been in it for most of the day, but RN did not find it noted anywhere. MD stated continue to monitor, no interventions at this time.    0104: pt experienced a 13 beat run of v-tach shortly followed by a 10 beat run. She was symptomatic and stated she could feel her heart beating really fast. Notified Dr Clydene who ordered a phosphorous and a magnesium  to go with her morning labs. Those have been drawn and sent down. Dr Clydene also requested cardiology be notified of these events. Cardiology has been paged.    0130: Received call from on call cardiologist. Stated to continue treatment and call back if anything changes.    0220: Updated Dr Clydene about lab results. Potassium and magnesium  ordered to replete.     Bedside and Verbal shift change report given to Tiffany RN (oncoming nurse) by Darryle RN (offgoing nurse). Report included the following information SBAR, Intake/Output, MAR, Recent Results, Cardiac Rhythm: and Alarm Parameters .

## 2020-11-28 NOTE — Progress Notes (Signed)
11/28/2020  4:23 PM  Case management note    Reason for Admission:  CHF    Patient came to hospital for SOB. Patient lives with son, drives and uses a cane for ambulation patient has history of cirrhosis, CAD, CKd, HTN, pacemaker, OSA with CPAP and chronic narcotic use.  [facility] [place]                     RUR Score:        15%             Plan for utilizing home health:      PT/OT to eval    PCP: First and Last name:  Arcola Jansky, NP     Name of Practice:    Are you a current patient: Yes/No:    Approximate date of last visit:    Can you participate in a virtual visit with your PCP:                     Current Advanced Directive/Advance Care Plan: Full Code NO AD      Healthcare Decision Maker:   Shastina Rua son [phone]                             Transition of Care Plan:             Home with family assistance  PCP follow up  AD planning  CM to follow for discharge needs    Care Management Interventions  PCP Verified by CM: Yes Fransisco Beau, [place])  Support Systems: Child(ren)  The Plan for Transition of Care is Related to the Following Treatment Goals : CHF  Discharge Location  Patient Expects to be Discharged to:: Home with family assistance  Fredrik Rigger BSRT

## 2020-11-29 LAB — CBC WITH AUTOMATED DIFF
ABS. BASOPHILS: 0 10*3/uL (ref 0.0–0.1)
ABS. EOSINOPHILS: 0.3 10*3/uL (ref 0.0–0.4)
ABS. IMM. GRANS.: 0 10*3/uL (ref 0.00–0.04)
ABS. LYMPHOCYTES: 2 10*3/uL (ref 0.8–3.5)
ABS. MONOCYTES: 0.4 10*3/uL (ref 0.0–1.0)
ABS. NEUTROPHILS: 4.7 10*3/uL (ref 1.8–8.0)
ABSOLUTE NRBC: 0 10*3/uL (ref 0.00–0.01)
BASOPHILS: 0 % (ref 0–1)
EOSINOPHILS: 4 % (ref 0–7)
HCT: 34 % — ABNORMAL LOW (ref 35.0–47.0)
HGB: 10.8 g/dL — ABNORMAL LOW (ref 11.5–16.0)
IMMATURE GRANULOCYTES: 0 % (ref 0.0–0.5)
LYMPHOCYTES: 27 % (ref 12–49)
MCH: 30.5 PG (ref 26.0–34.0)
MCHC: 31.8 g/dL (ref 30.0–36.5)
MCV: 96 FL (ref 80.0–99.0)
MONOCYTES: 5 % (ref 5–13)
MPV: 10.7 FL (ref 8.9–12.9)
NEUTROPHILS: 64 % (ref 32–75)
NRBC: 0 PER 100 WBC
PLATELET: 67 10*3/uL — ABNORMAL LOW (ref 150–400)
RBC: 3.54 M/uL — ABNORMAL LOW (ref 3.80–5.20)
RDW: 13.7 % (ref 11.5–14.5)
WBC: 7.4 10*3/uL (ref 3.6–11.0)

## 2020-11-29 LAB — METABOLIC PANEL, BASIC
Anion gap: 3 mmol/L — ABNORMAL LOW (ref 5–15)
BUN/Creatinine ratio: 13 (ref 12–20)
BUN: 19 MG/DL (ref 6–20)
CO2: 40 mmol/L — ABNORMAL HIGH (ref 21–32)
Calcium: 8.1 MG/DL — ABNORMAL LOW (ref 8.5–10.1)
Chloride: 101 mmol/L (ref 97–108)
Creatinine: 1.43 MG/DL — ABNORMAL HIGH (ref 0.55–1.02)
Glucose: 98 mg/dL (ref 65–100)
Potassium: 3.6 mmol/L (ref 3.5–5.1)
Sodium: 144 mmol/L (ref 136–145)
eGFR: 41 mL/min/{1.73_m2} — ABNORMAL LOW (ref >60)

## 2020-11-29 LAB — GLUCOSE, POC
Glucose (POC): 102 mg/dL (ref 65–117)
Glucose (POC): 115 mg/dL (ref 65–117)
Glucose (POC): 192 mg/dL — ABNORMAL HIGH (ref 65–117)
Glucose (POC): 117 mg/dL (ref 65–117)

## 2020-11-29 LAB — PHOSPHORUS
Phosphorus: 3.4 MG/DL (ref 2.6–4.7)
Phosphorus: 3.4 MG/DL (ref 2.6–4.7)

## 2020-11-29 LAB — MAGNESIUM
Magnesium: 1.5 mg/dL — ABNORMAL LOW (ref 1.6–2.4)
Magnesium: 1.5 mg/dL — ABNORMAL LOW (ref 1.6–2.4)

## 2020-11-29 LAB — POCT GLUCOSE
POC Glucose: 102 mg/dL (ref 65–117)
POC Glucose: 115 mg/dL (ref 65–117)
POC Glucose: 192 mg/dL — ABNORMAL HIGH (ref 65–117)
POC Glucose: 117 mg/dL (ref 65–117)

## 2020-11-29 LAB — CBC WITH AUTO DIFFERENTIAL
Basophils %: 0 % (ref 0–1)
Basophils Absolute: 0 10*3/uL (ref 0.0–0.1)
Eosinophils %: 4 % (ref 0–7)
Eosinophils Absolute: 0.3 10*3/uL (ref 0.0–0.4)
Granulocyte Absolute Count: 0 10*3/uL (ref 0.00–0.04)
Hematocrit: 34 % — ABNORMAL LOW (ref 35.0–47.0)
Hemoglobin: 10.8 g/dL — ABNORMAL LOW (ref 11.5–16.0)
Immature Granulocytes: 0 % (ref 0.0–0.5)
Lymphocytes %: 27 % (ref 12–49)
Lymphocytes Absolute: 2 10*3/uL (ref 0.8–3.5)
MCH: 30.5 PG (ref 26.0–34.0)
MCHC: 31.8 g/dL (ref 30.0–36.5)
MCV: 96 FL (ref 80.0–99.0)
MPV: 10.7 FL (ref 8.9–12.9)
Monocytes %: 5 % (ref 5–13)
Monocytes Absolute: 0.4 10*3/uL (ref 0.0–1.0)
NRBC Absolute: 0 10*3/uL (ref 0.00–0.01)
Neutrophils %: 64 % (ref 32–75)
Neutrophils Absolute: 4.7 10*3/uL (ref 1.8–8.0)
Nucleated RBCs: 0 PER 100 WBC
Platelets: 67 10*3/uL — ABNORMAL LOW (ref 150–400)
RBC: 3.54 M/uL — ABNORMAL LOW (ref 3.80–5.20)
RDW: 13.7 % (ref 11.5–14.5)
WBC: 7.4 10*3/uL (ref 3.6–11.0)

## 2020-11-29 LAB — BASIC METABOLIC PANEL
Anion Gap: 3 mmol/L — ABNORMAL LOW (ref 5–15)
BUN: 19 MG/DL (ref 6–20)
Bun/Cre Ratio: 13 (ref 12–20)
CO2: 40 mmol/L — ABNORMAL HIGH (ref 21–32)
Calcium: 8.1 MG/DL — ABNORMAL LOW (ref 8.5–10.1)
Chloride: 101 mmol/L (ref 97–108)
Creatinine: 1.43 MG/DL — ABNORMAL HIGH (ref 0.55–1.02)
ESTIMATED GLOMERULAR FILTRATION RATE: 41 mL/min/{1.73_m2} — ABNORMAL LOW (ref >60)
Glucose: 98 mg/dL (ref 65–100)
Potassium: 3.6 mmol/L (ref 3.5–5.1)
Sodium: 144 mmol/L (ref 136–145)

## 2020-11-29 MED ORDER — POLYETHYLENE GLYCOL 3350 17 GRAM (100 %) ORAL POWDER PACKET
17 gram | ORAL | Status: AC
Start: 2020-11-29 — End: 2020-11-29
  Administered 2020-11-29 – 2020-11-30 (×8): via ORAL

## 2020-11-29 MED ORDER — LACTULOSE 10 GRAM/15 ML ORAL SOLUTION
10 gram/15 mL | Freq: Two times a day (BID) | ORAL | Status: DC
Start: 2020-11-29 — End: 2020-11-29
  Administered 2020-11-29: 22:00:00 via ORAL

## 2020-11-29 MED ORDER — METOPROLOL SUCCINATE SR 50 MG 24 HR TAB
50 mg | Freq: Two times a day (BID) | ORAL | Status: DC
Start: 2020-11-29 — End: 2020-11-30
  Administered 2020-11-29 – 2020-11-30 (×2): via ORAL

## 2020-11-29 MED ORDER — IPRATROPIUM-ALBUTEROL 2.5 MG-0.5 MG/3 ML NEB SOLUTION
2.5 mg-0.5 mg/3 ml | RESPIRATORY_TRACT | Status: AC
Start: 2020-11-29 — End: 2020-11-29
  Administered 2020-11-29: 06:00:00

## 2020-11-29 MED ADMIN — sodium chloride (NS) flush 5-40 mL: INTRAVENOUS | @ 10:00:00 | NDC 87701099893

## 2020-11-29 MED ADMIN — gabapentin (NEURONTIN) capsule 300 mg: ORAL | @ 20:00:00 | NDC 00904666661

## 2020-11-29 MED ADMIN — magnesium sulfate 2 g/50 ml IVPB (premix or compounded): INTRAVENOUS | @ 07:00:00 | NDC 63323010603

## 2020-11-29 MED ADMIN — sodium chloride (NS) flush 5-40 mL: INTRAVENOUS | @ 18:00:00 | NDC 87701099893

## 2020-11-29 MED ADMIN — potassium chloride SR (KLOR-CON 10) tablet 40 mEq: ORAL | @ 07:00:00 | NDC 00904721661

## 2020-11-29 MED ADMIN — gabapentin (NEURONTIN) capsule 300 mg: ORAL | @ 12:00:00 | NDC 00904666661

## 2020-11-29 MED ADMIN — bumetanide (BUMEX) injection 2 mg: INTRAVENOUS | @ 23:00:00 | NDC 00641600701

## 2020-11-29 MED ADMIN — gabapentin (NEURONTIN) capsule 300 mg: ORAL | NDC 76420004790

## 2020-11-29 MED ADMIN — sodium chloride (NS) flush 5-40 mL: INTRAVENOUS | @ 01:00:00 | NDC 87701099893

## 2020-11-29 MED ADMIN — magnesium citrate solution 296 mL: ORAL | @ 21:00:00

## 2020-11-29 MED ADMIN — insulin lispro (HUMALOG) injection: SUBCUTANEOUS | @ 16:00:00 | NDC 00002751017

## 2020-11-29 MED ADMIN — gabapentin (NEURONTIN) capsule 300 mg: ORAL | @ 02:00:00 | NDC 00904666661

## 2020-11-29 MED ADMIN — metoprolol succinate (TOPROL-XL) XL tablet 50 mg: ORAL | @ 16:00:00 | NDC 51079016901

## 2020-11-29 MED ADMIN — montelukast (SINGULAIR) tablet 10 mg: ORAL | @ 01:00:00 | NDC 00904680861

## 2020-11-29 MED ADMIN — bumetanide (BUMEX) injection 2 mg: INTRAVENOUS | @ 14:00:00 | NDC 00641600701

## 2020-11-29 MED FILL — MAGNESIUM SULFATE 2 GRAM/50 ML IVPB: 2 gram/50 mL (4 %) | INTRAVENOUS | Qty: 50

## 2020-11-29 MED FILL — MONTELUKAST 10 MG TAB: 10 mg | ORAL | Qty: 1

## 2020-11-29 MED FILL — GABAPENTIN 300 MG CAP: 300 mg | ORAL | Qty: 1

## 2020-11-29 MED FILL — HEPARIN (PORCINE) 5,000 UNIT/ML IJ SOLN: 5000 unit/mL | INTRAMUSCULAR | Qty: 1

## 2020-11-29 MED FILL — INSULIN LISPRO 100 UNIT/ML INJECTION: 100 unit/mL | SUBCUTANEOUS | Qty: 1

## 2020-11-29 MED FILL — PIPERACILLIN-TAZOBACTAM 3.375 GRAM IV SOLR: 3.375 gram | INTRAVENOUS | Qty: 3.38

## 2020-11-29 MED FILL — TRAZODONE 100 MG TAB: 100 mg | ORAL | Qty: 1

## 2020-11-29 MED FILL — IPRATROPIUM-ALBUTEROL 2.5 MG-0.5 MG/3 ML NEB SOLUTION: 2.5 mg-0.5 mg/3 ml | RESPIRATORY_TRACT | Qty: 3

## 2020-11-29 MED FILL — OXYCODONE 5 MG TAB: 5 mg | ORAL | Qty: 1

## 2020-11-29 MED FILL — BISACODYL 5 MG TAB, DELAYED RELEASE: 5 mg | ORAL | Qty: 2

## 2020-11-29 MED FILL — BUMETANIDE 0.25 MG/ML IJ SOLN: 0.25 mg/mL | INTRAMUSCULAR | Qty: 10

## 2020-11-29 MED FILL — LEVOTHYROXINE 25 MCG TAB: 25 mcg | ORAL | Qty: 1

## 2020-11-29 MED FILL — SUCRALFATE 1 GRAM TAB: 1 gram | ORAL | Qty: 1

## 2020-11-29 MED FILL — METOPROLOL SUCCINATE SR 50 MG 24 HR TAB: 50 mg | ORAL | Qty: 1

## 2020-11-29 MED FILL — POLYETHYLENE GLYCOL 3350 17 GRAM (100 %) ORAL POWDER PACKET: 17 gram | ORAL | Qty: 1

## 2020-11-29 MED FILL — POTASSIUM CHLORIDE SR 10 MEQ TAB: 10 mEq | ORAL | Qty: 4

## 2020-11-29 MED FILL — CITALOPRAM 20 MG TAB: 20 mg | ORAL | Qty: 2

## 2020-11-29 MED FILL — METOPROLOL SUCCINATE SR 25 MG 24 HR TAB: 25 mg | ORAL | Qty: 2

## 2020-11-29 NOTE — Progress Notes (Signed)
Progress Note  Date:11/29/2020       Room:SAS10/10  Patient Name:Deborah [name]     Date of Birth:[birth year]     Age:65 y.o.      Subjective    Subjective:  Symptoms:  Stable.    Diet:  No nausea or vomiting.    Pain:  She reports no pain.     Review of Systems   Constitutional:  Negative for appetite change, fatigue and fever.   Gastrointestinal:  Positive for constipation. Negative for abdominal pain, blood in stool, nausea and vomiting.   Skin:  Negative for pallor.   Objective         Vitals Last 24 Hours:  TEMPERATURE:  Temp  Avg: 98.8 ??F (37.1 ??C)  Min: 98.5 ??F (36.9 ??C)  Max: 98.9 ??F (37.2 ??C)  RESPIRATIONS RANGE: Resp  Avg: 19.3  Min: 16  Max: 23  PULSE OXIMETRY RANGE: SpO2  Avg: 96.5 %  Min: 93 %  Max: 99 %  PULSE RANGE: Pulse  Avg: 82.9  Min: 70  Max: 90  BLOOD PRESSURE RANGE: Systolic (07PXT), GGY:694 , Min:80 , WNI:627   ; Diastolic (03JKK), XFG:18, Min:46, Max:83    I/O (24Hr):  No intake or output data in the 24 hours ending 11/29/20 1041    Objective:  General Appearance:  Comfortable and not in pain (Morbidly obese).    Vital signs: (most recent): Blood pressure (!) 95/50, pulse 70, temperature 98.5 ??F (36.9 ??C), resp. rate 17, height _0  (1.702 m), weight (!) 165.6 kg (365 lb), SpO2 98 %.  No fever.    Output: No stool output.    HEENT: Normal HEENT exam.    Lungs:  Normal effort and normal respiratory rate.  Breath sounds clear to auscultation.    Heart: Normal rate.  Regular rhythm.  S1 normal and S2 normal.  No murmur.   Abdomen: Abdomen is soft and non-distended.  Bowel sounds are normal.   There is no abdominal tenderness.     Extremities: Normal range of motion.  There is no dependent edema.    Pulses: Distal pulses are intact.    Neurological: Patient is alert and oriented to person, place and time.    Pupils:  Pupils are equal, round, and reactive to light.    Skin:  Warm and dry.    Labs/Imaging/Diagnostics    Labs:  CBC:  Recent Labs     11/29/20  0121  11/28/20  0734 11/27/20  1313   WBC 7.4 8.9 10.5   RBC 3.54* 3.72* 3.71*   HGB 10.8* 11.4* 11.6   HCT 34.0* 37.2 36.2   MCV 96.0 100.0* 97.6   RDW 13.7 14.0 13.7   PLT 67* 57* 75*     CHEMISTRIES:  Recent Labs     11/29/20  0121 11/28/20  0734 11/27/20  1313   NA 144 142 143   K 3.6 4.0 4.6   CL 101 103 105   CO2 40* 34* 36*   BUN 19 22* 25*   CA 8.1* 8.1* 8.8   PHOS 3.4  --   --    MG 1.5*  --   --    PT/INR:  Recent Labs     11/27/20  1314   INR 1.1     APTT:No results for input(s): APTT in the last 72 hours.  LIVER PROFILE:  Recent Labs     11/28/20  0734 11/27/20  1313   AST 11* 13*   ALT  11* 13     Lab Results   Component Value Date/Time    ALT (SGPT) 11 (L) 11/28/2020 07:34 AM    AST (SGOT) 11 (L) 11/28/2020 07:34 AM    Alk. phosphatase 56 11/28/2020 07:34 AM    Bilirubin, total 3.5 (H) 11/28/2020 07:34 AM       Imaging Last 24 Hours:  ECHO ADULT COMPLETE    Result Date: 11/28/2020    Left Ventricle: Not well visualized. Normal left ventricular systolic function with a visually estimated EF of 65 - 70%. Not well visualized. Left ventricle size is normal. Moderately increased wall thickness. Unable to assess wall motion.   Right Ventricle: Not well visualized.   Aortic Valve: Not well visualized. Bioprosthetic valve.  Suboptimal Doppler, however velocity > 3 m/sec appreciated across aortic valve.   Technical qualifiers: Echo study was technically difficult with poor endocardial visualization and technically difficult due to patient's body habitus.   Contrast used: Definity.    Assessment//Plan   Active Problems:    Thrombocytopenia ([place]) (04/30/2020)      Morbid obesity ([place]) (04/30/2020)      CKD (chronic kidney disease), stage III ([place]) (04/30/2020)      Pacemaker ()      Hypothyroid ()      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) ()      Chronic pain ()      Hyperlipidemia ()      GERD (gastroesophageal reflux disease) ()      CHF (congestive heart failure) ([place]) (11/27/2020)      Elevated bilirubin (11/27/2020)       Liver cirrhosis ([place]) (11/27/2020)      Colitis (11/27/2020)    Assessment:   Deborah [name] is a 65 y.o. female who is seen in consultation for abdominal pain, abnormal CT.  Deborah [name] has a myriad of medical problems; she states she is moved here about a year ago from [place] where she had her GI evaluation.  GI evaluation included colonoscopy; her understanding is nothing was found.  She has been told of liver cirrhosis; the only diagnosis linked to cirrhosis is her morbid obesity.    She presents to the hospital with abdominal pain, chest pain, difficulty breathing; CT scan is abnormal with identification of colonic wall thickening.  She does not have diarrhea; in fact she has constipation with a couple of bowel movements per week.  She has not had vomiting, visible blood loss, fever.    11/28/2020: Cardiology consult and echocardiogram ordered.  She has ongoing mid abdominal tenderness to palpation.  Denies any significant rectal bleeding, has chronic constipation.  Reports having had an EGD at the same time as her colonoscopy a few years ago in [place], both of them were normal per her report.    11/29/2020: Cardiology evaluation ongoing.  EF 65-70 on echocardiogram.  Hemoglobin stable.  Abdominal pain better.  Bisacodyl ordered yesterday to help with constipation.).     Plan:    (- Will hold off on colonoscopy until cardiology evaluation complete. Will order clear liquids for tomorrow and evaluate in the morning regarding scheduling colonoscopy for 12/01/2020. ).     Electronically signed by Joesph July, NP on 11/29/2020 at 9:46 AM    She appears to be comfortable although she states her sensation of breathing is not much different from yesterday.  Cardiology evaluation noted; advised can make an attempt for examination on the fourth; next opportunity would be the seventh.  She would prefer  the fourth; she does not wish to remain in the hospital until the seventh unless necessary.    Will begin  preparation, plan for the fourth unless there are cardiac findings requiring exam postponement.  I have interviewed and examined patient with addendum to note above and formulation care plan to reflect my evaluation    Deborah [name], M.D.

## 2020-11-29 NOTE — Progress Notes (Signed)
Bedside and Verbal shift change report given to Rachel, RN (oncoming nurse) by Lindsay, RN (offgoing nurse). Report included the following information SBAR, Kardex, and ED Summary.

## 2020-11-29 NOTE — Progress Notes (Signed)
Progress  Notes by Latricia Heft, MD at 11/29/20 1040                Author: Latricia Heft, MD  Service: Nephrology  Author Type: Physician       Filed: 11/29/20 1044  Date of Service: 11/29/20 1040  Status: Signed          Editor: Latricia Heft, MD (Physician)                                               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 No further workup at this time   Watch labs, avoid nephrotoxins             Subjective:     CC: follow up CKD   HPI: Creat stable around 1.4   ROS: SOB better     Current Facility-Administered Medications          Medication  Dose  Route  Frequency           ?  albuterol-ipratropium (DUO-NEB) 2.5 mg-0.5 mg/3 ml nebulizer solution            ?  metoprolol succinate (TOPROL-XL) XL tablet 50 mg   50 mg  Oral  DAILY     ?  bisacodyL (DULCOLAX) tablet 10 mg   10 mg  Oral  BID           ?  oxyCODONE IR (ROXICODONE) tablet 5 mg   5 mg  Oral  Q4H PRN           ?  morphine injection 2 mg   2 mg  IntraVENous  Q4H PRN     ?  citalopram (CELEXA) tablet 40 mg   40 mg  Oral  DAILY     ?  gabapentin (NEURONTIN) capsule 300 mg   300 mg  Oral  TID     ?  levothyroxine (SYNTHROID) tablet 137 mcg   137 mcg  Oral  ACB     ?  montelukast (SINGULAIR) tablet 10 mg   10 mg  Oral  QHS     ?  sucralfate (CARAFATE) tablet 1 g   1 g  Oral  TID     ?  traZODone (DESYREL) tablet 100 mg   100 mg  Oral  QHS     ?  sodium chloride (NS) flush 5-40 mL   5-40 mL  IntraVENous  Q8H     ?  sodium chloride (NS) flush 5-40 mL   5-40 mL  IntraVENous  PRN     ?  acetaminophen (TYLENOL) tablet 650 mg   650 mg  Oral  Q6H PRN          Or           ?  acetaminophen (TYLENOL) suppository 650 mg  650 mg  Rectal  Q6H PRN     ?  polyethylene glycol  (MIRALAX) packet 17 g   17 g  Oral  DAILY PRN     ?  ondansetron (ZOFRAN ODT) tablet 4 mg   4 mg  Oral  Q8H PRN          Or           ?  ondansetron (ZOFRAN) injection 4 mg   4 mg  IntraVENous  Q6H PRN     ?  heparin (porcine) injection 5,000 Units   5,000 Units  SubCUTAneous  Q8H     ?  bumetanide (BUMEX) injection 2 mg   2 mg  IntraVENous  BID     ?  piperacillin-tazobactam (ZOSYN) 3.375 g in 0.9% sodium chloride (MBP/ADV) 100 mL MBP   3.375 g  IntraVENous  Q8H     ?  insulin lispro (HUMALOG) injection     SubCUTAneous  AC&HS     ?  glucose chewable tablet 16 g   4 Tablet  Oral  PRN           ?  glucagon (GLUCAGEN) injection 1 mg   1 mg  IntraMUSCular  PRN           ?  dextrose 10% infusion 0-250 mL   0-250 mL  IntraVENous  PRN           ?  albuterol-ipratropium (DUO-NEB) 2.5 MG-0.5 MG/3 ML   3 mL  Nebulization  Q6H RT                 Objective:        Vitals:   Blood pressure (!) 95/50, pulse 70, temperature 98.5 ??F (36.9 ??C), resp. rate 17,  height 5\' 7"  (1.702 m), weight (!) 165.6 kg (365 lb), SpO2 98 %.   Temp (24hrs), Avg:98.8 ??F (37.1 ??C), Min:98.5 ??F (36.9 ??C), Max:98.9 ??F (37.2 ??C)         Intake and Output:   No intake/output data recorded.   10/31 1901 - 11/02 0700   In: 350 [I.V.:350]   Out: 1700 [Urine:1700]         Physical Exam:                GENERAL ASSESSMENT: NAD   HEENT: Nontraumatic    CHEST: On oxygen, no distress   HEART: reg   EXTREMITY: +EDEMA   NEURO: Grossly non focal                   ECG/rhythm:      Data Review         No results for input(s): TNIPOC in the last 72 hours.      No lab exists for component: ITNL    No results for input(s): CPK, CKMB, TROIQ in the last 72 hours.   Recent Labs         11/29/20   0121  11/28/20   0734  11/27/20   1313      NA  144  142  143      K  3.6  4.0  4.6      CL  101  103  105      CO2  40*  34*  36*      BUN  19  22*  25*      CREA  1.43*  1.42*  1.55*      GLU  98  131*  126*      PHOS  3.4   --    --       MG  1.5*   --    --       CA  8.1*  8.1*   8.8      ALB   --   3.2*  3.3*      WBC  7.4  8.9  10.5      HGB  10.8*  11.4*  11.6      HCT  34.0*  37.2  36.2      PLT  67*  57*  75*             Recent Labs         11/27/20   1314      INR  1.1      PTP  11.8*            Needs: urine analysis, urine sodium, protein and creatinine   Lab Results      Component  Value  Date/Time        Sodium,urine random  81  11/27/2020 07:14 PM        Creatinine, urine random  27.00  11/28/2020 04:49 PM                     : Latricia Heft, MD   11/29/2020            [facility]:   [email or web address]   [email or web address]      [place] office:   [address], [address]   [place], [place] [address]   Phone: [phone]   Fax :     [phone]      [facility] office:   [address]   [place], [place] [address]   Phone - [phone]   Fax - [phone]

## 2020-11-29 NOTE — Progress Notes (Signed)
Attempted to call report to receiving RN. RN to call back.     1547: Report given to Leotis Shames, RN using [employer].

## 2020-11-29 NOTE — Care Coordination-Inpatient (Signed)
Chart reviewed by Heart Failure Nurse Navigator.  Heart Failure database completed.     Patient came to ED with R sided abdominal pain and recent presumed URI.  She was noted to have weight gain with increased shortness of breath and leg swelling.  She is admitted with acute respiratory failure, fluid overload, and acute HFpEF along with colitis.  Cardiology, Nephrology, and Gastroenterology are consulted.  Patient has PMH of CKD, DM, hypothyroid, hyperlipidemia, OSA on CPAP, GERD, asthma, previous AVR, morbid obesity, and anxiety and depression.      EF:  65 to 70% with normal LV size, moderately increased wall thickness, indeterminate diastolic function, bioprosthetic aortic valve but no well visualized.    ACEi/ARB/ARNi: Not indicated    BB: Metoprolol succinate 50 mg daily    Aldosterone Antagonist: Not indicated    Obstructive Sleep Apnea Screening:  OSA diagnosis with home CPAP     CRT  Not indicated    NYHA Functional Class **.      Heart Failure Teach Back in Patient Education.    Heart Failure Avoiding Triggers on Discharge Instructions.      Cardiologist: Dr Marlane Hatcher consulted    Post discharge follow up phone call to be made within 48-72 hours of discharge.       Met with patient at bedside.  Introduced self and role of HF NN.  Patient was sitting on side of bed, comfortable wearing oxygen.  Assessed her understanding of illness and hospitalization.  She states she started with an URI which she had gotten from her family.  She was forcing fluids and had a poor appetite for food.  She states she is supposed to take her torsemide daily but she really only takes it as needed because of urinary frequency.  She noticed sudden edema and took her usual torsemide but noted it did not help.  She states one month ago her weight was 336 lbs.  She states her weight in the ED was 365 lbs.  Standing scale weight done by me during our conversation and was 343 lbs.  Reviewed diastolic HF disease process in setting of DM,  CKD, sleep apnea.   Reviewed symptoms of HF, HF zones, rationale for daily weight, and early recognition of symptoms and notification of provider for diuretic adjustment.  Recommended taking diuretics as ordered and benefit of prevent fluid congestion.  Briefly discussed relationship of sodium to fluid congestion and symptoms.    Discussed benefit of treating sleep apnea.  Patient states she has an older CPAP machine and one that was recalled which she still uses.   Recommended she establish with sleep clinic for follow up.  Her previous provider was in [place].    Patient was given the Living with HF Education book, HF zone handout, and HF calendar.

## 2020-11-29 NOTE — Progress Notes (Signed)
Progress Notes by [name], MD at 11/29/20 0815                Author: [name], MD  Service: Internal Medicine  Author Type: Physician       Filed: 11/29/20 1419  Date of Service: 11/29/20 0815  Status: Signed          Editor: [name], MD (Physician)                    [facility]   [address] [place], [place]  [address]   [phone]             Hospitalist Progress Note            NAME:  Deborah [name]    DOB:  1955/10/21    MRN:  [id number]      Date/Time:  11/29/2020       Patient PCP:  Arcola Jansky, NP      Emergency Contact:     Extended Emergency Contact Information   Primary Emergency Contact: [facility] Phone: [phone]   Mobile Phone: [phone]   Relation: Son   Interpreter needed? No        Code: Full Code       Isolation Precautions: There are currently no Active Isolations                Subjective:        REASON FOR VISIT:  Recheck SOB        HPI & INTERVAL HISTORY:      Deborah [name] is a 65 y.o. female with history that includes DM, CKD, chronic pain, hypothyroidism, and morbid obesity presents with dyspnea, leg swelling, weight gain, and abdominal pain      11/02: Patient is looking and feeling better.  Blood pressure remains somewhat low but she states that she normally runs in the 90s.  Shortness of breath is improving.      11/01: Patient seen and examined.  Complains of mild to moderate generalized abdominal pain which is constant dull and achy without radiation.  Associated with nausea but no vomiting.  Has had loose bowel movements with this.  Still has a lot of edema/anasarca  but breathing is improving with diuresis.   Echo 11/28/20:   ?  Left Ventricle: Not well visualized. Normal left ventricular systolic function with a visually estimated EF of 65 - 70%. Not well visualized. Left ventricle  size is normal. Moderately increased wall thickness. Unable to assess wall motion.   ?  Right Ventricle: Not  well visualized.   ?  Aortic Valve: Not well visualized. Bioprosthetic valve.  Suboptimal Doppler, however velocity > 3 m/sec appreciated across aortic valve.   ?  Technical qualifiers: Echo study was technically difficult with poor endocardial visualization and technically difficult due to patient's body habitus.   ?  Contrast used: Definity.            ALLERGIES     Allergies        Allergen  Reactions         ?  Nitroglycerin  Unknown (comments)             hypotension         ?  Aloe Vera  Rash     ?  Hydrochlorothiazide  Other (comments)             Reports '  kidneys dry up"          ?  Tetanus And Diphther. Tox (Pf)  Swelling             Swelling of arm and it turns black              ROS:   Gen:   Negative   Resp:  shortness of breath   CVS:   edema and DOE   GI:       abdominal pain, denies melena, and denies hematochezia   GU:     negative                  Objective:         Visit Vitals      BP  98/83 (BP 1 Location: Left lower arm, BP Patient Position: At rest)     Pulse  87     Temp  98.9 ??F (37.2 ??C)     Resp  19     Ht  5\' 7"  (1.702 m)     Wt  (!) 165.6 kg (365 lb)     SpO2  97%        BMI  57.17 kg/m??           Physical Exam:   General: NAD   Head: Normocephalic, without obvious abnormality, atraumatic   Eyes: anicteric sclerae and conjuntiva clear   ENT: lips, mucosa, and tongue normal   Neck: normal, supple, and no tenderness   Lungs: clear to auscultation   Heart: S1, S2 normal, regular rate, regular rhythm, and systolic murmur present   Abd: not distended, soft, generalized tenderness to palpation, with guarding, no rebound, BS present and normactive   Ext: no cyanosis and bilateral lower extremity 2+ edema   Skin: normal skin color, no rashes, and texture normal   Neuro:  alert, oriented, no defects noted in general exam.   Psych: not anxious, cooperative, appropriate affect         Medications:     Current Facility-Administered Medications          Medication  Dose  Route  Frequency           ?   albuterol-ipratropium (DUO-NEB) 2.5 mg-0.5 mg/3 ml nebulizer solution            ?  bisacodyL (DULCOLAX) tablet 10 mg   10 mg  Oral  BID     ?  oxyCODONE IR (ROXICODONE) tablet 5 mg   5 mg  Oral  Q4H PRN     ?  morphine injection 2 mg   2 mg  IntraVENous  Q4H PRN     ?  citalopram (CELEXA) tablet 40 mg   40 mg  Oral  DAILY     ?  gabapentin (NEURONTIN) capsule 300 mg   300 mg  Oral  TID     ?  levothyroxine (SYNTHROID) tablet 137 mcg   137 mcg  Oral  ACB     ?  montelukast (SINGULAIR) tablet 10 mg   10 mg  Oral  QHS     ?  sucralfate (CARAFATE) tablet 1 g   1 g  Oral  TID     ?  traZODone (DESYREL) tablet 100 mg   100 mg  Oral  QHS     ?  sodium chloride (NS) flush 5-40 mL   5-40 mL  IntraVENous  Q8H     ?  sodium chloride (NS) flush  5-40 mL   5-40 mL  IntraVENous  PRN     ?  acetaminophen (TYLENOL) tablet 650 mg   650 mg  Oral  Q6H PRN          Or           ?  acetaminophen (TYLENOL) suppository 650 mg   650 mg  Rectal  Q6H PRN     ?  polyethylene glycol (MIRALAX) packet 17 g   17 g  Oral  DAILY PRN     ?  ondansetron (ZOFRAN ODT) tablet 4 mg   4 mg  Oral  Q8H PRN          Or           ?  ondansetron (ZOFRAN) injection 4 mg   4 mg  IntraVENous  Q6H PRN     ?  heparin (porcine) injection 5,000 Units   5,000 Units  SubCUTAneous  Q8H     ?  bumetanide (BUMEX) injection 2 mg   2 mg  IntraVENous  BID     ?  piperacillin-tazobactam (ZOSYN) 3.375 g in 0.9% sodium chloride (MBP/ADV) 100 mL MBP   3.375 g  IntraVENous  Q8H     ?  insulin lispro (HUMALOG) injection     SubCUTAneous  AC&HS     ?  glucose chewable tablet 16 g   4 Tablet  Oral  PRN     ?  glucagon (GLUCAGEN) injection 1 mg   1 mg  IntraMUSCular  PRN     ?  dextrose 10% infusion 0-250 mL   0-250 mL  IntraVENous  PRN           ?  albuterol-ipratropium (DUO-NEB) 2.5 MG-0.5 MG/3 ML   3 mL  Nebulization  Q6H RT            Labs:     Recent Labs           11/29/20   0121     WBC  7.4     HGB  10.8*     HCT  34.0*        PLT  67*             Recent Labs             11/29/20   0121  11/28/20   0734     NA  144  142     K  3.6  4.0     CL  101  103     CO2  40*  34*     GLU  98  131*     BUN  19  22*     CREA  1.43*  1.42*     CA  8.1*  8.1*     MG  1.5*   --      PHOS  3.4   --      ALB   --   3.2*     TBILI   --   3.5*         ALT   --   11*                 Radiology:   No results found.   I personally reviewed and interpreted the imaging studies and agree with official reading      The chart, labs, imaging studies, medications, and consultants notes was reviewed by me on: November 29, 2020  Assessment/Plan:         Acute respiratory failure with hypoxia due to acute HFpEF with exertional dyspnea, fluid overload/anasarca, weight gain, and elevated proBNP:    Unsure of trigger  Improving medically   Continue diuresis  Monitor daily weights and I's and O's   CT of the chest: Trace pleural effusion, no PE.     Continue supplemental oxygen to keep SaO2 greater than 90%.    Echocardiogram EF 65-70%   Will need a 2-day Lexiscan but patient unable to tolerate at this time.   Cardiology consulted        Aortic valve replaced / Pacemaker:   Cardiology consulted.        Abdominal pain /possible colitis:   CT scan of the abdomen shows colitis.     Continue Zosyn   Continue bisacodyl   Continue Roxicodone for mod pain and morphine IV for severe pain   Full liquid diet   GI consulted and note reviewed (awaiting cardio eval)       DM type 2 causing renal disease:   A1c 6.1.  Well-controlled   Continue ISS      CKD stage III:   Avoid nephrotoxic drugs.  Watch Cr as pt was given contrast for CT scan.  Check urine lytes and consult nephrology       Liver cirrhosis / Elevated bilirubin    Likely d/t morbid obesity CT of the abdomen shows: Hepatic steatosis and cirrhotic change.     Avoid hepatotoxic drugs.  Holding statin for now.       Thrombocytopenia:     Likely secondary to cirrhosis       Hypothyroidism:   Continue Synthroid. Check TSH        Hyperlipidemia:   On statin but  will hold for now       Morbid obesity / OSA:    Uses CPAP.       Chronic pain:   Continue pain medicine.        F: No fluids   E: Monitor especially on diuretic   N: ADULT DIET Full Liquid; 4 carb choices (60 gm/meal); Low Sodium (2 gm)   ADULT DIET Clear Liquid          Risk of deterioration: high        Discussed:  Pt's condition, Imaging findings, Lab findings, Assessment, Care Plan, and D/C Planning discussed with: Patient, Family at bedsude, RN, and Care Manager      Prophylaxis:  Hep SQ      Anticipated discharge disposition:  Home with family      Discharge barriers: HR DC Barriers: Currently not medically stable for discharge         Total time: -35- minutes **I personally saw and examined the patient during this time period**         Date of service:    11/29/2020                    ___________________________________________________      Admitting Physician: [name], MD

## 2020-11-29 NOTE — Progress Notes (Signed)
Problem: Falls - Risk of  Goal: *Absence of Falls  Description: Document Schmid Fall Risk and appropriate interventions in the flowsheet.  Outcome: Progressing Towards Goal  Note: Fall Risk Interventions:  Mobility Interventions: Bed/chair exit alarm, Patient to call before getting OOB         Medication Interventions: Bed/chair exit alarm, Patient to call before getting OOB    Elimination Interventions: Bed/chair exit alarm, Call light in reach              Problem: Patient Education: Go to Patient Education Activity  Goal: Patient/Family Education  Outcome: Progressing Towards Goal

## 2020-11-29 NOTE — Progress Notes (Signed)
Progress Notes by Vincent Peyer, NP at 11/29/20 [phone]                Author: Vincent Peyer, NP  Service: Cardiology  Author Type: Nurse Practitioner       Filed: 11/29/20 0853  Date of Service: 11/29/20 0733  Status: Attested           Editor: Vincent Peyer, NP (Nurse Practitioner)  Cosigner: Victorino December, MD at 11/29/20 1716          Attestation signed by Victorino December, MD at 11/29/20 [phone]          Cardiology Attending:      Patient personally seen and examined. All the elements of history and examination were personally performed. Assessment and plan was discussed and agree as written above.          Briefly      Deborah [name] is a 65 y.o. female with PMH of AVR, PPM admitted with SOB.             AAOx3   Chest - clear to auscultation, no wheezes, rales or rhonchi   Heart - normal rate, regular rhythm, normal S1, S2, no murmurs, rubs, clicks or gallops   Abdomen - soft, nontender, nondistended, no masses or organomegaly   Extremities - peripheral pulses normal, no pedal edema         Assessment:      - HFpEF   - PVC   - NSVT   - AVR   - Morbid Obesity   - OSA   - ppm   - CKD         Plan:       - Need to get ischemia eval but due to body habitus it is difficult to get stress Nuc.   - We can get CCTA if renal will clear for a contrast dose of and if her PVC is better controlled.       - I will increase Toprol XL to twice daily for better control of PVC and NSVT       - Could try CCTA in am if above is achieved.           ___________________________________________________      Rod Can. Dagoberto Reef, MD, [facility]                                                                                                                    [facility]   Cardiology Care Note                    Initial visit     Established visit         Patient Name: Deborah [name] - DOB:1955/11/25 - UJW:[id number]   Primary Cardiologist: Dr Loma Newton    Consulting Cardiologist: Dr Dagoberto Reef         Reason for  consult: CHF       HPI:  [name] is a 65 y.o. female with PMH significant for aortic valve replacement, CKD, hypertension, CAD s/p PCI, permanent pacemaker who is admitted with symptoms of increasing shortness of breath  from last 3 to 4 days.        Patient noticed increasing abdominal girth and lower extremity edema despite taking diuretics which were not working from last 2 weeks.  Denies any symptoms of chest pain, palpitations, lightheadedness or dizziness.                       SUBJECTIVE:        Lynsi Dooner reports occ palpitations         Assessment and Plan          1.  Acute heart failure preserved EF: Unclear etiology.  We will continue diuresis. ECHO shows:  Left Ventricle: Not well visualized. Normal left ventricular systolic function with a visually estimated EF of 65 - 70%. Not well visualized. Left ventricle size is normal. Moderately increased wall thickness. Unable to assess wall motion.   ?  Right Ventricle: Not well visualized.  Needs daily standing weight and strict I/O.       2 freq PVC's: EP recommended 2 day lexiscan after review of pacemaker interrogation in August. She has chronically been unable to lie flat  and would not be able to lie under nuclear camera now.       3.  History of aortic valve replacement: Most recent echocardiogram demonstrated velocity > 3 m/sec appreciated across  aortic valve.       3.  CKD:        4.  Morbid obesity: Contributing to shortness of breath and heart failure preserved EF.       5.  Sleep apnea: Continue CPAP.       6.  Permanent pacemaker : Functioning well on last check                  ____________________________________________________________      Cardiac testing         ECHO ADULT COMPLETE 11/28/2020 11/28/2020      Interpretation Summary   ?  Left Ventricle: Not well visualized. Normal left ventricular systolic function with a visually estimated EF  of 65 - 70%. Not well visualized. Left ventricle size is normal. Moderately increased  wall thickness. Unable to assess wall motion.   ?  Right Ventricle: Not well visualized.   ?  Aortic Valve: Not well visualized. Bioprosthetic valve.  Suboptimal Doppler, however velocity > 3 m/sec appreciated across aortic valve.   ?  Technical qualifiers: Echo study was technically difficult with poor endocardial visualization and technically difficult due to patient's  body habitus.   ?  Contrast used: Definity.      Signed by: Candyce Churn, DO on 11/28/2020  2:54 PM                     Most recent HS troponins:     Recent Labs           11/27/20   1313        TROPHS  13     EKG at the time of presentation demonstrated intermittent paced rhythm and PVCs in the form of bigeminy.       Troponin are negative, NT proBNP is 2198.         Review of Systems:      [x] All other  systems  reviewed and all negative except as written in HPI      []  Patient unable to provide secondary to condition            Past Medical History:        Diagnosis  Date         ?  Anxiety and depression       ?  Aortic valve replaced       ?  Asthma       ?  Chronic narcotic use       ?  Chronic pain       ?  CKD (chronic kidney disease), stage III (HCC)       ?  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  GERD (gastroesophageal reflux disease)       ?  Hyperlipidemia       ?  Hypothyroid       ?  Morbid obesity (HCC)       ?  Neuropathy       ?  Pacemaker           ?  Rhinitis          No past surgical history on file.   Social Hx:  reports that she quit smoking about 10 years ago. Her smoking use included cigarettes. She has a 20.00 pack-year smoking history. She has never used smokeless tobacco. She reports that she does not currently use alcohol. She reports that she  does not use drugs.   Family Hx: family history includes Hypertension in her father and mother.     Allergies        Allergen  Reactions         ?  Nitroglycerin  Unknown (comments)             hypotension         ?  Aloe Vera  Rash     ?  Hydrochlorothiazide  Other  (comments)             Reports 'kidneys dry up"          ?  Tetanus And Diphther. Tox (Pf)  Swelling             Swelling of arm and it turns black              OBJECTIVE:     Wt Readings from Last 3 Encounters:        11/28/20  (!) 165.6 kg (365 lb)     11/05/20  152 kg (335 lb)        08/26/20  152.4 kg (336 lb)        No intake or output data in the 24 hours ending 11/29/20 0733      Physical Exam:      Vitals:      Vitals:             11/28/20 2059  11/29/20 0015  11/29/20 0148  11/29/20 0400           BP:    100/61    98/83     Pulse:    87    87     Resp:    21    19     Temp:    98.9 ??F (37.2 ??C)    98.9 ??F (37.2 ??C)     SpO2:  97%  93%  98%  97%  Weight:                   Height:                Telemetry: NSR freq PVC's, NSVT      Gen: Well-developed, well-nourished, in no acute distress   Neck: Supple, No JVD, No Carotid Bruit   Resp: No accessory muscle use, exp wheezing throughout    Card: Regular Rate, Rhythm, Normal S1, S2, No murmurs, rubs or gallop.    Abd:   obese, Soft, non-tender, non-distended, BS+    MSK: No cyanosis   Skin: No rashes     Neuro: Moving all four extremities, follows commands appropriately   Psych: Good insight, oriented to person, place, alert, Nml Affect   LE: + 1 LE       Data Review:       Radiology:    XR Results (most recent):   Results from Hospital Encounter encounter on 11/05/20      XR CHEST PORT      Narrative   EXAM:  XR CHEST PORT      INDICATION: Chest pain      COMPARISON: 08/26/2020      TECHNIQUE: Upright portable chest AP view      FINDINGS: Left subclavian pacemaker and median sternotomy changes with cardiac   monitoring leads. Heart size is mildly enlarged. The pulmonary vasculature is   within normal limits.      The lungs and pleural spaces are clear. The visualized bones and upper abdomen   are age-appropriate.      Impression   No acute cardiopulmonary process.           Recent Labs            11/29/20   0121  11/28/20   0734     NA  144  142     K  3.6   4.0     CL  101  103     CO2  40*  34*     BUN  19  22*     CREA  1.43*  1.42*     GLU  98  131*     PHOS  3.4   --          CA  8.1*  8.1*          Recent Labs            11/29/20   0121  11/28/20   0734     WBC  7.4  8.9     HGB  10.8*  11.4*     HCT  34.0*  37.2         PLT  67*  57*          Recent Labs             11/28/20   0734  11/27/20   1314  11/27/20   1313     PTP   --   11.8*   --      INR   --   1.1   --           AP  56   --   59        No results for input(s): CHOL, LDLC in the last 72 hours.      No lab exists for component: TGL, HDLC,  HBA1C         Current  meds:      Current Facility-Administered Medications:    ?  albuterol-ipratropium (DUO-NEB) 2.5 mg-0.5 mg/3 ml nebulizer solution, , , ,    ?  bisacodyL (DULCOLAX) tablet 10 mg, 10 mg, Oral, BID, Ian Malkin, MD   ?  oxyCODONE IR (ROXICODONE) tablet 5 mg, 5 mg, Oral, Q4H PRN, [name], MD, 5 mg at 11/29/20 7616   ?  morphine injection 2 mg, 2 mg, IntraVENous, Q4H PRN, [name], MD   ?  citalopram (CELEXA) tablet 40 mg, 40 mg, Oral, DAILY, Tefera, Mesfin, MD   ?  gabapentin (NEURONTIN) capsule 300 mg, 300 mg, Oral, TID, Tefera, Mesfin, MD, 300 mg at 11/28/20 2222   ?  levothyroxine (SYNTHROID) tablet 137 mcg, 137 mcg, Oral, ACB, Tefera, Mesfin, MD, 137 mcg at 11/29/20 0540   ?  montelukast (SINGULAIR) tablet 10 mg, 10 mg, Oral, QHS, Tefera, Mesfin, MD, 10 mg at 11/28/20 2118   ?  sucralfate (CARAFATE) tablet 1 g, 1 g, Oral, TID, Tefera, Mesfin, MD, 1 g at 11/28/20 2118   ?  traZODone (DESYREL) tablet 100 mg, 100 mg, Oral, QHS, Tefera, Mesfin, MD, 100 mg at 11/28/20 2118   ?  sodium chloride (NS) flush 5-40 mL, 5-40 mL, IntraVENous, Q8H, Tefera, Mesfin, MD, 10 mL at 11/29/20 0540   ?  sodium chloride (NS) flush 5-40 mL, 5-40 mL, IntraVENous, PRN, Tefera, Mesfin, MD   ?  acetaminophen (TYLENOL) tablet 650 mg, 650 mg, Oral, Q6H PRN **OR** acetaminophen (TYLENOL) suppository 650 mg, 650 mg, Rectal, Q6H PRN,  Tefera, Mesfin, MD   ?   polyethylene glycol (MIRALAX) packet 17 g, 17 g, Oral, DAILY PRN, Tefera, Mesfin, MD   ?  ondansetron (ZOFRAN ODT) tablet 4 mg, 4 mg, Oral, Q8H PRN **OR** ondansetron (ZOFRAN) injection 4 mg, 4 mg, IntraVENous, Q6H PRN, Tefera,  Mesfin, MD   ?  heparin (porcine) injection 5,000 Units, 5,000 Units, SubCUTAneous, Q8H, Tefera, Mesfin, MD, 5,000 Units at 11/29/20 0540   ?  bumetanide (BUMEX) injection 2 mg, 2 mg, IntraVENous, BID, Tefera, Mesfin, MD, 2 mg at 11/28/20 1713   ?  piperacillin-tazobactam (ZOSYN) 3.375 g in 0.9% sodium chloride (MBP/ADV) 100 mL MBP, 3.375 g, IntraVENous, Q8H, Tefera, Mesfin, MD, Last  Rate: 25 mL/hr at 11/29/20 0238, 3.375 g at 11/29/20 0238   ?  insulin lispro (HUMALOG) injection, , SubCUTAneous, AC&HS, Mylo Red, Mesfin, MD, 2 Units at 11/28/20 0018   ?  glucose chewable tablet 16 g, 4 Tablet, Oral, PRN, Tefera, Mesfin, MD   ?  glucagon (GLUCAGEN) injection 1 mg, 1 mg, IntraMUSCular, PRN, Tefera, Mesfin, MD   ?  dextrose 10% infusion 0-250 mL, 0-250 mL, IntraVENous, PRN, Tefera, Mesfin, MD   ?  albuterol-ipratropium (DUO-NEB) 2.5 MG-0.5 MG/3 ML, 3 mL, Nebulization, Q6H RT, Tefera, Mesfin, MD, 3 mL at 11/29/20 0148      Vincent Peyer, NP      [facility]   [address], [address]   [place], [place] [address]   [phone]         [id number], Oneal Deputy, NP

## 2020-11-29 NOTE — Progress Notes (Signed)
11/29/20  1:45 PM    Care Management Daily Progress Note:    1. Hypoxia    2. Colitis    3. Urinary tract infection with hematuria, site unspecified    4. Acute congestive heart failure, unspecified heart failure type (HCC)    5. Elevated serum creatinine      RUR: 14%  Level: Low  LOS: 2D    Transition of Care:  1). Patient continues to require medical intervention  2). Cardiology, GI and Nephrology following  3). Colonoscopy- 12/01/20  4). CM following for dc needs    [facility]

## 2020-11-29 NOTE — Progress Notes (Signed)
 1622: TRANSFER - IN REPORT:    Verbal report received from [employer] (name) on Deborah [name]  being received from [facility](unit) for routine progression of care      Report consisted of patient's Situation, Background, Assessment and   Recommendations(SBAR).     Information from the following report(s) SBAR, Kardex, Accordion, and Cardiac Rhythm A Paced  was reviewed with the receiving nurse.    Opportunity for questions and clarification was provided.      Assessment completed upon patient's arrival to unit and care assumed.     1830: Notified MD of patient's BP of 109/58. MD gave okay to give scheduled metoprolol  and bumex 

## 2020-11-30 LAB — GLUCOSE, POC
Glucose (POC): 150 mg/dL — ABNORMAL HIGH (ref 65–117)
Glucose (POC): 116 mg/dL (ref 65–117)
Glucose (POC): 180 mg/dL — ABNORMAL HIGH (ref 65–117)
Glucose (POC): 137 mg/dL — ABNORMAL HIGH (ref 65–117)

## 2020-11-30 LAB — METABOLIC PANEL, BASIC
Anion gap: 4 mmol/L — ABNORMAL LOW (ref 5–15)
BUN/Creatinine ratio: 11 — ABNORMAL LOW (ref 12–20)
BUN: 16 MG/DL (ref 6–20)
CO2: 37 mmol/L — ABNORMAL HIGH (ref 21–32)
Calcium: 8.9 MG/DL (ref 8.5–10.1)
Chloride: 99 mmol/L (ref 97–108)
Creatinine: 1.42 MG/DL — ABNORMAL HIGH (ref 0.55–1.02)
Glucose: 123 mg/dL — ABNORMAL HIGH (ref 65–100)
Potassium: 4.1 mmol/L (ref 3.5–5.1)
Sodium: 140 mmol/L (ref 136–145)
eGFR: 42 mL/min/{1.73_m2} — ABNORMAL LOW (ref >60)

## 2020-11-30 LAB — CBC W/O DIFF
ABSOLUTE NRBC: 0 10*3/uL (ref 0.00–0.01)
HCT: 36.9 % (ref 35.0–47.0)
HGB: 11.7 g/dL (ref 11.5–16.0)
MCH: 30.3 PG (ref 26.0–34.0)
MCHC: 31.7 g/dL (ref 30.0–36.5)
MCV: 95.6 FL (ref 80.0–99.0)
MPV: 10.9 FL (ref 8.9–12.9)
NRBC: 0 PER 100 WBC
PLATELET: 84 10*3/uL — ABNORMAL LOW (ref 150–400)
RBC: 3.86 M/uL (ref 3.80–5.20)
RDW: 13.6 % (ref 11.5–14.5)
WBC: 7.7 10*3/uL (ref 3.6–11.0)

## 2020-11-30 LAB — AFP, TUMOR MARKER
AFP, SERUM, TUMOR MARKER, 002266: 2.3 ng/mL (ref 0.0–9.2)
AFP, Serum, Tumor Marker: 2.3 ng/mL (ref 0.0–9.2)

## 2020-11-30 LAB — SAMPLES BEING HELD

## 2020-11-30 LAB — HEMOGLOBIN A1C WITH EAG
Est. average glucose: 128 mg/dL
Hemoglobin A1c: 6.1 % — ABNORMAL HIGH (ref 4.0–5.6)

## 2020-11-30 LAB — MAGNESIUM
Magnesium: 1.9 mg/dL (ref 1.6–2.4)
Magnesium: 1.9 mg/dL (ref 1.6–2.4)

## 2020-11-30 LAB — CBC
Hematocrit: 36.9 % (ref 35.0–47.0)
Hemoglobin: 11.7 g/dL (ref 11.5–16.0)
MCH: 30.3 PG (ref 26.0–34.0)
MCHC: 31.7 g/dL (ref 30.0–36.5)
MCV: 95.6 FL (ref 80.0–99.0)
MPV: 10.9 FL (ref 8.9–12.9)
NRBC Absolute: 0 10*3/uL (ref 0.00–0.01)
Nucleated RBCs: 0 PER 100 WBC
Platelets: 84 10*3/uL — ABNORMAL LOW (ref 150–400)
RBC: 3.86 M/uL (ref 3.80–5.20)
RDW: 13.6 % (ref 11.5–14.5)
WBC: 7.7 10*3/uL (ref 3.6–11.0)

## 2020-11-30 LAB — POCT GLUCOSE
POC Glucose: 150 mg/dL — ABNORMAL HIGH (ref 65–117)
POC Glucose: 116 mg/dL (ref 65–117)
POC Glucose: 180 mg/dL — ABNORMAL HIGH (ref 65–117)
POC Glucose: 137 mg/dL — ABNORMAL HIGH (ref 65–117)

## 2020-11-30 LAB — BASIC METABOLIC PANEL
Anion Gap: 4 mmol/L — ABNORMAL LOW (ref 5–15)
BUN: 16 MG/DL (ref 6–20)
Bun/Cre Ratio: 11 — ABNORMAL LOW (ref 12–20)
CO2: 37 mmol/L — ABNORMAL HIGH (ref 21–32)
Calcium: 8.9 MG/DL (ref 8.5–10.1)
Chloride: 99 mmol/L (ref 97–108)
Creatinine: 1.42 MG/DL — ABNORMAL HIGH (ref 0.55–1.02)
ESTIMATED GLOMERULAR FILTRATION RATE: 42 mL/min/{1.73_m2} — ABNORMAL LOW (ref >60)
Glucose: 123 mg/dL — ABNORMAL HIGH (ref 65–100)
Potassium: 4.1 mmol/L (ref 3.5–5.1)
Sodium: 140 mmol/L (ref 136–145)

## 2020-11-30 LAB — HEMOGLOBIN A1C W/EAG
Hemoglobin A1C: 6.1 % — ABNORMAL HIGH (ref 4.0–5.6)
eAG: 128 mg/dL

## 2020-11-30 MED ORDER — AMIODARONE 200 MG TAB
200 mg | Freq: Two times a day (BID) | ORAL | Status: AC
Start: 2020-11-30 — End: ?
  Administered 2020-11-30 – 2020-12-03 (×7): via ORAL

## 2020-11-30 MED ORDER — METOPROLOL SUCCINATE SR 50 MG 24 HR TAB
50 mg | Freq: Every day | ORAL | Status: AC
Start: 2020-11-30 — End: ?
  Administered 2020-12-01 – 2020-12-03 (×3): via ORAL

## 2020-11-30 MED ORDER — IPRATROPIUM-ALBUTEROL 2.5 MG-0.5 MG/3 ML NEB SOLUTION
2.5 mg-0.5 mg/3 ml | Freq: Four times a day (QID) | RESPIRATORY_TRACT | Status: AC
Start: 2020-11-30 — End: ?
  Administered 2020-12-01 – 2020-12-03 (×6): via RESPIRATORY_TRACT

## 2020-11-30 MED ADMIN — bisacodyL (DULCOLAX) tablet 10 mg: ORAL | @ 13:00:00 | NDC 00904640761

## 2020-11-30 MED ADMIN — sodium chloride (NS) flush 5-40 mL: INTRAVENOUS | @ 10:00:00 | NDC 87701099893

## 2020-11-30 MED ADMIN — polyethylene glycol (MIRALAX) packet 17 g: ORAL | @ 20:00:00 | NDC 62559015717

## 2020-11-30 MED ADMIN — bumetanide (BUMEX) injection 2 mg: INTRAVENOUS | @ 22:00:00 | NDC 00641600701

## 2020-11-30 MED ADMIN — polyethylene glycol (MIRALAX) packet 17 g: ORAL | @ 15:00:00 | NDC 62559015717

## 2020-11-30 MED ADMIN — bumetanide (BUMEX) injection 2 mg: INTRAVENOUS | @ 13:00:00 | NDC 00641600701

## 2020-11-30 MED ADMIN — montelukast (SINGULAIR) tablet 10 mg: ORAL | @ 02:00:00 | NDC 00904680861

## 2020-11-30 MED ADMIN — gabapentin (NEURONTIN) capsule 300 mg: ORAL | @ 02:00:00 | NDC 00904666661

## 2020-11-30 MED ADMIN — polyethylene glycol (MIRALAX) packet 17 g: ORAL | @ 18:00:00 | NDC 62559015717

## 2020-11-30 MED ADMIN — insulin lispro (HUMALOG) injection: SUBCUTANEOUS | @ 15:00:00 | NDC 00002751017

## 2020-11-30 MED ADMIN — bisacodyL (DULCOLAX) tablet 10 mg: ORAL | @ 18:00:00 | NDC 00904640761

## 2020-11-30 MED ADMIN — gabapentin (NEURONTIN) capsule 300 mg: ORAL | @ 13:00:00 | NDC 00904666661

## 2020-11-30 MED ADMIN — sodium chloride (NS) flush 5-40 mL: INTRAVENOUS | @ 13:00:00 | NDC 87701099893

## 2020-11-30 MED ADMIN — bisacodyL (DULCOLAX) tablet 10 mg: ORAL | @ 20:00:00 | NDC 00904640761

## 2020-11-30 MED ADMIN — polyethylene glycol (MIRALAX) packet 17 g: ORAL | @ 13:00:00 | NDC 62559015717

## 2020-11-30 MED ADMIN — bisacodyL (DULCOLAX) tablet 10 mg: ORAL | @ 15:00:00 | NDC 00904640761

## 2020-11-30 MED ADMIN — gabapentin (NEURONTIN) capsule 300 mg: ORAL | @ 20:00:00 | NDC 00904666661

## 2020-11-30 MED ADMIN — sodium chloride (NS) flush 5-40 mL: INTRAVENOUS | @ 02:00:00 | NDC 87701099893

## 2020-11-30 MED FILL — AMIODARONE 200 MG TAB: 200 mg | ORAL | Qty: 2

## 2020-11-30 MED FILL — IPRATROPIUM-ALBUTEROL 2.5 MG-0.5 MG/3 ML NEB SOLUTION: 2.5 mg-0.5 mg/3 ml | RESPIRATORY_TRACT | Qty: 3

## 2020-11-30 MED FILL — POLYETHYLENE GLYCOL 3350 17 GRAM (100 %) ORAL POWDER PACKET: 17 gram | ORAL | Qty: 1

## 2020-11-30 MED FILL — HEPARIN (PORCINE) 5,000 UNIT/ML IJ SOLN: 5000 unit/mL | INTRAMUSCULAR | Qty: 1

## 2020-11-30 MED FILL — ONDANSETRON 4 MG TAB, RAPID DISSOLVE: 4 mg | ORAL | Qty: 1

## 2020-11-30 MED FILL — PIPERACILLIN-TAZOBACTAM 3.375 GRAM IV SOLR: 3.375 gram | INTRAVENOUS | Qty: 3.38

## 2020-11-30 MED FILL — GABAPENTIN 300 MG CAP: 300 mg | ORAL | Qty: 1

## 2020-11-30 MED FILL — BISACODYL 5 MG TAB, DELAYED RELEASE: 5 mg | ORAL | Qty: 2

## 2020-11-30 MED FILL — CITALOPRAM 20 MG TAB: 20 mg | ORAL | Qty: 2

## 2020-11-30 MED FILL — SUCRALFATE 1 GRAM TAB: 1 gram | ORAL | Qty: 1

## 2020-11-30 MED FILL — BUMETANIDE 0.25 MG/ML IJ SOLN: 0.25 mg/mL | INTRAMUSCULAR | Qty: 10

## 2020-11-30 MED FILL — METOPROLOL SUCCINATE SR 50 MG 24 HR TAB: 50 mg | ORAL | Qty: 1

## 2020-11-30 MED FILL — LEVOTHYROXINE 25 MCG TAB: 25 mcg | ORAL | Qty: 1

## 2020-11-30 MED FILL — TRAZODONE 50 MG TAB: 50 mg | ORAL | Qty: 2

## 2020-11-30 MED FILL — MONTELUKAST 10 MG TAB: 10 mg | ORAL | Qty: 1

## 2020-11-30 MED FILL — OXYCODONE 5 MG TAB: 5 mg | ORAL | Qty: 1

## 2020-11-30 MED FILL — INSULIN LISPRO 100 UNIT/ML INJECTION: 100 unit/mL | SUBCUTANEOUS | Qty: 2

## 2020-11-30 NOTE — Progress Notes (Signed)
Problem: Falls - Risk of  Goal: *Absence of Falls  Description: Document Schmid Fall Risk and appropriate interventions in the flowsheet.  Outcome: Progressing Towards Goal  Note: Fall Risk Interventions:  Mobility Interventions: Bed/chair exit alarm, Patient to call before getting OOB         Medication Interventions: Bed/chair exit alarm, Patient to call before getting OOB, Teach patient to arise slowly    Elimination Interventions: Bed/chair exit alarm, Call light in reach, Patient to call for help with toileting needs

## 2020-11-30 NOTE — Progress Notes (Signed)
Progress Notes by Sharla Kidney, NP at 11/30/20 [phone]                Author: Sharla Kidney, NP  Service: Nurse Practitioner  Author Type: Nurse Practitioner       Filed: 11/30/20 0945  Date of Service: 11/30/20 0912  Status: Attested           Editor: Sharla Kidney, NP (Nurse Practitioner)  Cosigner: Victorino December, MD at 11/30/20 1210          Attestation signed by Victorino December, MD at 11/30/20 1210          Cardiology Attending:      Patient personally seen and examined. All the elements of history and examination were personally performed. Assessment and plan was discussed and agree as written above.          Briefly      Deborah [name] is a 65 y.o. female with PMH of AVR, PPM admitted with SOB.             AAOx3   Chest - clear to auscultation, no wheezes, rales or rhonchi   Heart - normal rate, regular rhythm, normal S1, S2, no murmurs, rubs, clicks or gallops   Abdomen - soft, nontender, nondistended, no masses or organomegaly   Extremities - peripheral pulses normal, no pedal edema         Assessment:      - HFpEF   - PVC   - NSVT   - AVR   - Morbid Obesity   - OSA   - ppm   - CKD         Plan:       - Need to get ischemia eval but due to body habitus it is difficult to get stress Nuc.      - We can get CCTA if renal will clear for a contrast dose of and if her PVC is better controlled.       - PVC and NSVT continue despite increase in Toprol XL. Will add Amiodarone for short term. Reduce Toprol XL.       - Could try CCTA as OP.           ___________________________________________________      Rod Can. Dagoberto Reef, MD, [facility]                                                                                                                    [facility] [place]   Cardiology Care Note                   []  Initial visit     [x] Established visit         Patient Name: Deborah [name] - DOB:01/08/1956 - Deborah [name]   Primary Cardiologist: Dr 01/25/1956    Consulting Cardiologist: Dr GYK:[id number]          Reason for consult: CHF       HPI:  Deborah [name] is a 65 y.o. female with PMH significant for aortic valve replacement, CKD, hypertension, CAD s/p PCI, permanent pacemaker who is admitted with symptoms of increasing shortness of breath  from last 3 to 4 days.        Patient noticed increasing abdominal girth and lower extremity edema despite taking diuretics which were not working from last 2 weeks.  Denies any symptoms of chest pain, palpitations, lightheadedness or dizziness.       SUBJECTIVE:        Deborah [name] reports frustration over colonoscopy plans, received prep overnight.          Assessment and Plan          1.  Acute heart failure preserved EF: Unclear etiology. TTE w/ nl LV function, EF 65-70%, unable to assess wall motion. Continue diuresis.  Needs daily standing weight and strict I/O. Will consider coronary CTA as OP as part of ischemic workup (pt unable to lie flat/have stress test), cont metoprolol XL      2. Freq PVC's: few runs of NSVT overnight, K 4.1, Mg 1.9. Add amio 400 mg bid x 1 week, then 400 mg daily x 1 week, then 200 mg daily. OP follow up with Dr. Loma Newton. EP recommended 2 day lexiscan after review of pacemaker interrogation in August. She has  chronically been unable to lie flat and would not be able to lie under nuclear camera now.       3. History of aortic valve replacement: Most recent echocardiogram demonstrated velocity > 3 m/sec appreciated across  aortic valve.       3. CKD: Cr at baseline, 1.42. Renal following       4. Morbid obesity: Contributing to shortness of breath and heart failure preserved EF.       5. Sleep apnea: Continue CPAP.       6. Permanent pacemaker: Functioning well on last check                  ____________________________________________________________      Cardiac testing         ECHO ADULT COMPLETE 11/28/2020 11/28/2020      Interpretation Summary   ?  Left Ventricle: Not well visualized. Normal left ventricular systolic function with a visually  estimated EF  of 65 - 70%. Not well visualized. Left ventricle size is normal. Moderately increased wall thickness. Unable to assess wall motion.   ?  Right Ventricle: Not well visualized.   ?  Aortic Valve: Not well visualized. Bioprosthetic valve.  Suboptimal Doppler, however velocity > 3 m/sec appreciated across aortic valve.   ?  Technical qualifiers: Echo study was technically difficult with poor endocardial visualization and technically difficult due to patient's  body habitus.   ?  Contrast used: Definity.      Signed by: Candyce Churn, DO on 11/28/2020  2:54 PM         Most recent HS troponins:     Recent Labs           11/27/20   1313        TROPHS  13     EKG at the time of presentation demonstrated intermittent paced rhythm and PVCs in the form of bigeminy.       Troponin are negative, NT proBNP is 2198.      Review of Systems:      [x] All other  systems reviewed and all negative except as written in HPI      []   Patient unable to provide secondary to condition            Past Medical History:        Diagnosis  Date         ?  Anxiety and depression       ?  Aortic valve replaced       ?  Asthma       ?  Chronic narcotic use       ?  Chronic pain       ?  CKD (chronic kidney disease), stage III (HCC)       ?  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  GERD (gastroesophageal reflux disease)       ?  Hyperlipidemia       ?  Hypothyroid       ?  Morbid obesity (HCC)       ?  Neuropathy       ?  Pacemaker           ?  Rhinitis          No past surgical history on file.   Social Hx:  reports that she quit smoking about 10 years ago. Her smoking use included cigarettes. She has a 20.00 pack-year smoking history. She has never used smokeless tobacco. She reports that she does not currently use alcohol. She reports that she  does not use drugs.   Family Hx: family history includes Hypertension in her father and mother.     Allergies        Allergen  Reactions         ?  Nitroglycerin  Unknown (comments)              hypotension         ?  Aloe Vera  Rash     ?  Hydrochlorothiazide  Other (comments)             Reports 'kidneys dry up"          ?  Tetanus And Diphther. Tox (Pf)  Swelling             Swelling of arm and it turns black              OBJECTIVE:     Wt Readings from Last 3 Encounters:        11/30/20  155 kg (341 lb 11.4 oz)     11/05/20  152 kg (335 lb)        08/26/20  152.4 kg (336 lb)           Intake/Output Summary (Last 24 hours) at 11/30/2020 0912   Last data filed at 11/29/2020 1745     Gross per 24 hour        Intake  660 ml        Output  --        Net  660 ml           Physical Exam:      Vitals:      Vitals:             11/29/20 2329  11/30/20 0250  11/30/20 0721  11/30/20 0807           BP:    (!) 123/58  123/66       Pulse:  86  85  86       Resp:  18  18       Temp:    98.5 ??F (36.9 ??C)  97.8 ??F (36.6 ??C)       SpO2:    91%  95%  96%     Weight:    155 kg (341 lb 11.4 oz)               Height:                Telemetry: NSR freq PVC's, NSVT      Gen: Well-developed, well-nourished, in no acute distress   Neck: Supple, No JVD, No Carotid Bruit   Resp: No accessory muscle use, exp wheezing throughout    Card: Regular Rate, Rhythm, Normal S1, S2, No murmurs, rubs or gallop.    Abd:   obese, Soft, non-tender, non-distended, BS+    MSK: No cyanosis   Skin: No rashes     Neuro: Moving all four extremities, follows commands appropriately   Psych: Good insight, oriented to person, place, alert, Nml Affect   LE: + 1 LE       Data Review:       Radiology:    XR Results (most recent):   Results from Hospital Encounter encounter on 11/05/20      XR CHEST PORT      Narrative   EXAM:  XR CHEST PORT      INDICATION: Chest pain      COMPARISON: 08/26/2020      TECHNIQUE: Upright portable chest AP view      FINDINGS: Left subclavian pacemaker and median sternotomy changes with cardiac   monitoring leads. Heart size is mildly enlarged. The pulmonary vasculature is   within normal limits.      The lungs and pleural  spaces are clear. The visualized bones and upper abdomen   are age-appropriate.      Impression   No acute cardiopulmonary process.           Recent Labs            11/30/20   0017  11/29/20   0121     NA  140  144     K  4.1  3.6     CL  99  101     CO2  37*  40*     BUN  16  19     CREA  1.42*  1.43*     GLU  123*  98     PHOS   --   3.4         CA  8.9  8.1*          Recent Labs            11/30/20   0017  11/29/20   0121     WBC  7.7  7.4     HGB  11.7  10.8*     HCT  36.9  34.0*         PLT  84*  67*          Recent Labs             11/28/20   0734  11/27/20   1314  11/27/20   1313     PTP   --   11.8*   --      INR   --   1.1   --           AP  56   --  59        No results for input(s): CHOL, LDLC in the last 72 hours.      No lab exists for component: TGL, HDLC,  HBA1C         Current meds:      Current Facility-Administered Medications:    ?  bisacodyL (DULCOLAX) tablet 10 mg, 10 mg, Oral, Q2H, Ian Malkin, MD   ?  polyethylene glycol (MIRALAX) packet 17 g, 17 g, Oral, Q2H, Ian Malkin, MD   ?  metoprolol succinate (TOPROL-XL) XL tablet 50 mg, 50 mg, Oral, BID, Rathi, Vikas K, MD, 50 mg at 11/29/20 1838   ?  oxyCODONE IR (ROXICODONE) tablet 5 mg, 5 mg, Oral, Q4H PRN, [name], MD, 5 mg at 11/29/20 2036   ?  morphine injection 2 mg, 2 mg, IntraVENous, Q4H PRN, [name], MD   ?  citalopram (CELEXA) tablet 40 mg, 40 mg, Oral, DAILY, Tefera, Mesfin, MD, 40 mg at 11/29/20 0824   ?  gabapentin (NEURONTIN) capsule 300 mg, 300 mg, Oral, TID, Tefera, Mesfin, MD, 300 mg at 11/29/20 2146   ?  levothyroxine (SYNTHROID) tablet 137 mcg, 137 mcg, Oral, ACB, Tefera, Mesfin, MD, 137 mcg at 11/29/20 0540   ?  montelukast (SINGULAIR) tablet 10 mg, 10 mg, Oral, QHS, Tefera, Mesfin, MD, 10 mg at 11/29/20 2146   ?  sucralfate (CARAFATE) tablet 1 g, 1 g, Oral, TID, Tefera, Mesfin, MD, 1 g at 11/29/20 2146   ?  traZODone (DESYREL) tablet 100 mg, 100 mg, Oral, QHS, Tefera, Mesfin, MD, 100 mg at 11/29/20 2146   ?   sodium chloride (NS) flush 5-40 mL, 5-40 mL, IntraVENous, Q8H, Tefera, Mesfin, MD, 10 mL at 11/29/20 2146   ?  sodium chloride (NS) flush 5-40 mL, 5-40 mL, IntraVENous, PRN, Tefera, Mesfin, MD   ?  acetaminophen (TYLENOL) tablet 650 mg, 650 mg, Oral, Q6H PRN **OR** acetaminophen (TYLENOL) suppository 650 mg, 650 mg, Rectal, Q6H PRN,  Tefera, Mesfin, MD   ?  polyethylene glycol (MIRALAX) packet 17 g, 17 g, Oral, DAILY PRN, Tefera, Mesfin, MD   ?  ondansetron (ZOFRAN ODT) tablet 4 mg, 4 mg, Oral, Q8H PRN **OR** ondansetron (ZOFRAN) injection 4 mg, 4 mg, IntraVENous, Q6H PRN, Tefera,  Mesfin, MD   ?  heparin (porcine) injection 5,000 Units, 5,000 Units, SubCUTAneous, Q8H, Tefera, Mesfin, MD, 5,000 Units at 11/30/20 0651   ?  bumetanide (BUMEX) injection 2 mg, 2 mg, IntraVENous, BID, Tefera, Mesfin, MD, 2 mg at 11/29/20 1837   ?  piperacillin-tazobactam (ZOSYN) 3.375 g in 0.9% sodium chloride (MBP/ADV) 100 mL MBP, 3.375 g, IntraVENous, Q8H, Tefera, Mesfin, MD, Last  Rate: 25 mL/hr at 11/30/20 0221, 3.375 g at 11/30/20 0221   ?  insulin lispro (HUMALOG) injection, , SubCUTAneous, AC&HS, Mylo Red, Mesfin, MD, 2 Units at 11/29/20 1145   ?  glucose chewable tablet 16 g, 4 Tablet, Oral, PRN, Tefera, Mesfin, MD   ?  glucagon (GLUCAGEN) injection 1 mg, 1 mg, IntraMUSCular, PRN, Tefera, Mesfin, MD   ?  dextrose 10% infusion 0-250 mL, 0-250 mL, IntraVENous, PRN, Tefera, Mesfin, MD   ?  albuterol-ipratropium (DUO-NEB) 2.5 MG-0.5 MG/3 ML, 3 mL, Nebulization, Q6H RT, Tefera, Mesfin, MD, 3 mL at 11/30/20 0806      Sharla Kidney, NP      [facility]   [address], [address]   [place], [place] [address]   [phone]  [id number], Oneal Deputy, NP

## 2020-11-30 NOTE — Progress Notes (Signed)
Cardiology Update:    Pacer interrogated, no evidence of R on T phenomenon, discussed w/ [employer] rep. Pacer revealed frequent PVC's, otherwise functioning normally.

## 2020-11-30 NOTE — Progress Notes (Signed)
Care Management follow up    Patient admitted for CHF, colitis, abdominal pain, UTI.  History of: CHF, CKD, AVR, sleep apnea, diabetes, cirrhosis.    RUR 13 (Score %) low   Is This a Readmission NO  Is this a Bundle NO    Current status  Patient discussed during interdisciplinary rounds.  Patient continues to require medical management including ongoing assessment and monitoring.  Patient continues on IV Bumex and antibiotics, oxygen at 2L/NC.  Prep for colonoscopy today, scheduled for tomorrow.  Cardiology, GI, nephrology following.      Transition of Care Plan  Monitor patient status and response to treatment.  Patient continues to require medical management.  CM needs: unsure at this time  Lives with son, drives  Transport home per family.  CM to monitor progress and recommendations.    Midge Aver, RN, MSN/Care manager

## 2020-11-30 NOTE — Progress Notes (Signed)
Progress  Notes by Latricia Heft, MD at 11/30/20 1004                Author: Latricia Heft, MD  Service: Nephrology  Author Type: Physician       Filed: 11/30/20 1005  Date of Service: 11/30/20 1004  Status: Signed          Editor: Latricia Heft, MD (Physician)                                               Sondra Barges 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 No further workup at this time   Watch labs, avoid nephrotoxins             Subjective:     CC: follow up CKD   HPI: CKD is stable. On Bumex, resp status improving. Still on NC O2, UOP not documented   ROS: SOB better, no n/v     Current Facility-Administered Medications          Medication  Dose  Route  Frequency           ?  amiodarone (CORDARONE) tablet 400 mg   400 mg  Oral  BID     ?  bisacodyL (DULCOLAX) tablet 10 mg   10 mg  Oral  Q2H     ?  polyethylene glycol (MIRALAX) packet 17 g   17 g  Oral  Q2H     ?  metoprolol succinate (TOPROL-XL) XL tablet 50 mg   50 mg  Oral  BID           ?  oxyCODONE IR (ROXICODONE) tablet 5 mg   5 mg  Oral  Q4H PRN           ?  morphine injection 2 mg   2 mg  IntraVENous  Q4H PRN     ?  citalopram (CELEXA) tablet 40 mg   40 mg  Oral  DAILY     ?  gabapentin (NEURONTIN) capsule 300 mg   300 mg  Oral  TID     ?  levothyroxine (SYNTHROID) tablet 137 mcg   137 mcg  Oral  ACB     ?  montelukast (SINGULAIR) tablet 10 mg   10 mg  Oral  QHS     ?  sucralfate (CARAFATE) tablet 1 g   1 g  Oral  TID     ?  traZODone (DESYREL) tablet 100 mg   100 mg  Oral  QHS     ?  sodium chloride (NS) flush 5-40 mL   5-40 mL  IntraVENous  Q8H     ?  sodium chloride (NS) flush 5-40 mL   5-40 mL  IntraVENous  PRN     ?  acetaminophen (TYLENOL) tablet 650 mg   650 mg  Oral  Q6H PRN           Or           ?  acetaminophen (TYLENOL) suppository 650 mg   650 mg  Rectal  Q6H PRN     ?  polyethylene glycol (MIRALAX) packet 17 g   17 g  Oral  DAILY PRN     ?  ondansetron (ZOFRAN ODT) tablet 4 mg   4 mg  Oral  Q8H PRN          Or           ?  ondansetron (ZOFRAN) injection 4 mg   4 mg  IntraVENous  Q6H PRN     ?  heparin (porcine) injection 5,000 Units   5,000 Units  SubCUTAneous  Q8H     ?  bumetanide (BUMEX) injection 2 mg   2 mg  IntraVENous  BID     ?  piperacillin-tazobactam (ZOSYN) 3.375 g in 0.9% sodium chloride (MBP/ADV) 100 mL MBP   3.375 g  IntraVENous  Q8H     ?  insulin lispro (HUMALOG) injection     SubCUTAneous  AC&HS     ?  glucose chewable tablet 16 g   4 Tablet  Oral  PRN     ?  glucagon (GLUCAGEN) injection 1 mg   1 mg  IntraMUSCular  PRN     ?  dextrose 10% infusion 0-250 mL   0-250 mL  IntraVENous  PRN           ?  albuterol-ipratropium (DUO-NEB) 2.5 MG-0.5 MG/3 ML   3 mL  Nebulization  Q6H RT                 Objective:        Vitals:   Blood pressure 123/66, pulse 86, temperature 97.8 ??F (36.6 ??C), resp. rate 18, height 5\' 7"  (1.702 m), weight 155 kg (341 lb 11.4 oz), SpO2 96 %.   Temp (24hrs), Avg:98.3 ??F (36.8 ??C), Min:97.8 ??F (36.6 ??C), Max:98.8 ??F (37.1 ??C)         Intake and Output:   No intake/output data recorded.   11/01 1901 - 11/03 0700   In: 660 [P.O.:660]   Out: -          Physical Exam:                GENERAL ASSESSMENT: NAD   HEENT: Nontraumatic    CHEST: On oxygen, no distress   HEART: S1S2   EXTREMITY: Swelling   NEURO: Grossly non focal                   ECG/rhythm:      Data Review         No results for input(s): TNIPOC in the last 72 hours.      No lab exists for component: ITNL    No results for input(s): CPK, CKMB, TROIQ in the last 72 hours.   Recent Labs         11/30/20   0017  11/29/20   0121  11/28/20   0734  11/27/20   1313      NA  140  144  142  143      K  4.1  3.6  4.0  4.6      CL  99  101  103  105      CO2  37*  40*  34*  36*  BUN  16  19  22*  25*       CREA  1.42*  1.43*  1.42*  1.55*      GLU  123*  98  131*  126*      PHOS   --   3.4   --    --       MG  1.9  1.5*   --    --       CA  8.9  8.1*  8.1*  8.8      ALB   --    --   3.2*  3.3*      WBC  7.7  7.4  8.9  10.5      HGB  11.7  10.8*  11.4*  11.6      HCT  36.9  34.0*  37.2  36.2      PLT  84*  67*  57*  75*                Recent Labs         11/27/20   1314      INR  1.1      PTP  11.8*               Needs: urine analysis, urine sodium, protein and creatinine   Lab Results      Component  Value  Date/Time        Sodium,urine random  81  11/27/2020 07:14 PM        Creatinine, urine random  27.00  11/28/2020 04:49 PM                     : Latricia Heft, MD   11/30/2020            [facility]:   [email or web address]   [email or web address]      [place] office:   [address], [address]   [place], [place] [address]   Phone: [phone]   Fax :     [phone]      [facility] office:   [address]   [place], [place] [address]   Phone - [phone]   Fax - [phone]

## 2020-11-30 NOTE — Progress Notes (Signed)
Progress Notes by Deborah Moat, MD at 11/30/20 [phone]                Author: [name], MD  Service: Internal Medicine  Author Type: Physician       Filed: 11/30/20 1256  Date of Service: 11/30/20 0657  Status: Signed          Editor: Deborah Moat, MD (Physician)                    [facility]   [address] [place], [place]  [address]   [phone]             Hospitalist Progress Note            NAME:  Deborah [name]    DOB:  1955/03/03    MRN:  [id number]      Date/Time:  11/30/2020       Patient PCP:  Arcola Jansky, NP      Emergency Contact:     Extended Emergency Contact Information   Primary Emergency Contact: [facility] Phone: [phone]   Mobile Phone: [phone]   Relation: Son   Interpreter needed? No        Code: Full Code       Isolation Precautions: There are currently no Active Isolations                Subjective:        REASON FOR VISIT:  Recheck SOB        HPI & INTERVAL HISTORY:      [name] is a 65 y.o. female with history that includes DM, CKD, chronic pain, hypothyroidism, and morbid obesity presents with dyspnea, leg swelling, weight gain, and abdominal pain      11/03: Patient seen and examined.  No acute events overnight.  There was confusion regarding patient's colonoscopy sched for tomorrow but was prepped overnight.       11/02: Patient is looking and feeling better.  Blood pressure remains somewhat low but she states that she normally runs in the 90s.  Shortness of breath is improving.      11/01: Patient seen and examined.  Complains of mild to moderate generalized abdominal pain which is constant dull and achy without radiation.  Associated with nausea but no vomiting.  Has had loose bowel movements with this.  Still has a lot of edema/anasarca  but breathing is improving with diuresis.   Echo 11/28/20:   ?  Left Ventricle: Not well visualized. Normal left ventricular systolic function with a visually  estimated EF of 65 - 70%. Not well visualized. Left ventricle  size is normal. Moderately increased wall thickness. Unable to assess wall motion.   ?  Right Ventricle: Not well visualized.   ?  Aortic Valve: Not well visualized. Bioprosthetic valve.  Suboptimal Doppler, however velocity > 3 m/sec appreciated across aortic valve.   ?  Technical qualifiers: Echo study was technically difficult with poor endocardial visualization and technically difficult due to patient's body habitus.   ?  Contrast used: Definity.            ALLERGIES     Allergies        Allergen  Reactions         ?  Nitroglycerin  Unknown (comments)             hypotension         ?  Aloe Vera  Rash     ?  Hydrochlorothiazide  Other (comments)             Reports 'kidneys dry up"          ?  Tetanus And Diphther. Tox (Pf)  Swelling             Swelling of arm and it turns black              ROS:   Gen:   Negative   Resp:  shortness of breath   CVS:   edema and DOE   GI:       abdominal pain, denies melena, and denies hematochezia   GU:     negative                  Objective:         Visit Vitals      BP  (!) 123/58 (BP 1 Location: Left lower arm, BP Patient Position: Sitting)     Pulse  85     Temp  98.5 ??F (36.9 ??C)     Resp  18     Ht  5\' 7"  (1.702 m)     Wt  155 kg (341 lb 11.4 oz)     SpO2  91%        BMI  53.52 kg/m??           Physical Exam:   General: NAD   Head: Normocephalic, without obvious abnormality, atraumatic   Eyes: anicteric sclerae and conjuntiva clear   ENT: lips, mucosa, and tongue normal   Neck: normal, supple, and no tenderness   Lungs: clear to auscultation   Heart: S1, S2 normal, regular rate, regular rhythm, and systolic murmur present   Abd: not distended, soft, generalized tenderness to palpation, with guarding, no rebound, BS present and normactive   Ext: no cyanosis and bilateral lower extremity 2+ edema   Skin: normal skin color, no rashes, and texture normal   Neuro:  alert, oriented, no defects noted in general  exam.   Psych: not anxious, cooperative, appropriate affect         Medications:     Current Facility-Administered Medications          Medication  Dose  Route  Frequency           ?  bisacodyL (DULCOLAX) tablet 10 mg   10 mg  Oral  Q2H     ?  polyethylene glycol (MIRALAX) packet 17 g   17 g  Oral  Q2H     ?  metoprolol succinate (TOPROL-XL) XL tablet 50 mg   50 mg  Oral  BID     ?  bisacodyL (DULCOLAX) tablet 10 mg   10 mg  Oral  BID     ?  oxyCODONE IR (ROXICODONE) tablet 5 mg   5 mg  Oral  Q4H PRN     ?  morphine injection 2 mg   2 mg  IntraVENous  Q4H PRN     ?  citalopram (CELEXA) tablet 40 mg   40 mg  Oral  DAILY     ?  gabapentin (NEURONTIN) capsule 300 mg   300 mg  Oral  TID     ?  levothyroxine (SYNTHROID) tablet 137 mcg   137 mcg  Oral  ACB     ?  montelukast (SINGULAIR) tablet 10 mg   10 mg  Oral  QHS     ?  sucralfate (CARAFATE) tablet 1 g   1 g  Oral  TID     ?  traZODone (DESYREL) tablet 100 mg   100 mg  Oral  QHS     ?  sodium chloride (NS) flush 5-40 mL   5-40 mL  IntraVENous  Q8H     ?  sodium chloride (NS) flush 5-40 mL   5-40 mL  IntraVENous  PRN           ?  acetaminophen (TYLENOL) tablet 650 mg   650 mg  Oral  Q6H PRN          Or           ?  acetaminophen (TYLENOL) suppository 650 mg   650 mg  Rectal  Q6H PRN     ?  polyethylene glycol (MIRALAX) packet 17 g   17 g  Oral  DAILY PRN     ?  ondansetron (ZOFRAN ODT) tablet 4 mg   4 mg  Oral  Q8H PRN          Or           ?  ondansetron (ZOFRAN) injection 4 mg   4 mg  IntraVENous  Q6H PRN     ?  heparin (porcine) injection 5,000 Units   5,000 Units  SubCUTAneous  Q8H     ?  bumetanide (BUMEX) injection 2 mg   2 mg  IntraVENous  BID     ?  piperacillin-tazobactam (ZOSYN) 3.375 g in 0.9% sodium chloride (MBP/ADV) 100 mL MBP   3.375 g  IntraVENous  Q8H     ?  insulin lispro (HUMALOG) injection     SubCUTAneous  AC&HS     ?  glucose chewable tablet 16 g   4 Tablet  Oral  PRN     ?  glucagon (GLUCAGEN) injection 1 mg   1 mg  IntraMUSCular  PRN     ?   dextrose 10% infusion 0-250 mL   0-250 mL  IntraVENous  PRN           ?  albuterol-ipratropium (DUO-NEB) 2.5 MG-0.5 MG/3 ML   3 mL  Nebulization  Q6H RT            Labs:     Recent Labs           11/30/20   0017     WBC  7.7     HGB  11.7     HCT  36.9        PLT  84*             Recent Labs              11/30/20   0017  11/29/20   0121  11/28/20   0734  11/28/20   0734     NA  140  144   --   142     K  4.1  3.6   --   4.0     CL  99  101   --   103     CO2  37*  40*   --   34*     GLU  123*  98   --   131*     BUN  16  19   --   22*     CREA  1.42*  1.43*   --   1.42*     CA  8.9  8.1*   --   8.1*     MG  1.9  1.5*    < >   --      PHOS   --   3.4   --    --      ALB   --    --    --   3.2*     TBILI   --    --    --   3.5*     ALT   --    --    --   11*        < > = values in this interval not displayed.                 Radiology:   No results found.            The chart, labs, imaging studies, medications, and consultants notes was reviewed by me on: November 30, 2020                  Assessment/Plan:         Acute respiratory failure with hypoxia due to acute HFpEF with exertional dyspnea, fluid overload/anasarca, weight gain, and elevated proBNP:    Iproving  Continue diuresis  Monitor daily weights and I's and O's   CT of the chest: Trace pleural effusion, no PE.     Continue supplemental oxygen to keep SaO2 greater than 90%.    Echocardiogram EF 65-70%   Will need a 2-day Lexiscan but patient unable to tolerate at this time.   Cardiology consulted        Aortic valve replaced / Pacemaker:   Cardiology consulted.        Abdominal pain /possible colitis:   CT scan of the abdomen shows colitis.     Continue Zosyn   Continue bisacodyl   Continue Roxicodone for mod pain and morphine IV for severe pain   Full liquid diet   GI consulted possible colonoscopy 11/04       DM type 2 causing renal disease:   A1c 6.1.  Well-controlled   Continue ISS      CKD stage III:   Avoid nephrotoxic drugs.  Watch Cr as pt was given  contrast for CT scan.  Check urine lytes and consult nephrology. Notes reviewed and appreciated ok to continue with diuresis       Liver cirrhosis / Elevated bilirubin    Likely d/t morbid obesity CT of the abdomen shows: Hepatic steatosis and cirrhotic change.     Avoid hepatotoxic drugs.  Holding statin for now.       Thrombocytopenia:     Likely secondary to cirrhosis       Hypothyroidism:   Continue Synthroid. Check TSH        Hyperlipidemia:   On statin but will hold for now       Morbid obesity / OSA:    Uses CPAP.       Chronic pain:   Continue pain medicine.        F: No fluids   E: Monitor especially on diuretic   N: ADULT DIET Clear Liquid          Risk of deterioration: high        Discussed:  Pt's condition, Imaging findings, Lab findings, Assessment, Care Plan, and D/C Planning discussed with: Patient, Family at bedsude, RN, and Care Manager      Prophylaxis:  Hep SQ  Anticipated discharge disposition:  Home with family      Discharge barriers: HR DC Barriers: Currently not medically stable for discharge         Total time: 30 minutes **I personally saw and examined the patient during this time period**         Date of service:    11/30/2020                    ___________________________________________________      Admitting Physician: Deborah Moat, MD

## 2020-11-30 NOTE — Progress Notes (Signed)
Progress Note  Date:11/30/2020       Room:325/01  Patient Name:Deborah [name]     Date of Birth:January 14, 1956     Age:65 y.o.      Subjective    Subjective:  Symptoms:  Stable.    Diet:  No nausea or vomiting.    Pain:  She reports no pain.     Review of Systems   Constitutional:  Negative for appetite change, fatigue and fever.   Gastrointestinal:  Negative for abdominal pain, blood in stool, constipation, nausea and vomiting.   Skin:  Negative for pallor.   Objective         Vitals Last 24 Hours:  TEMPERATURE:  Temp  Avg: 98.3 ??F (36.8 ??C)  Min: 97.8 ??F (36.6 ??C)  Max: 98.8 ??F (37.1 ??C)  RESPIRATIONS RANGE: Resp  Avg: 17.4  Min: 16  Max: 18  PULSE OXIMETRY RANGE: SpO2  Avg: 95.6 %  Min: 91 %  Max: 100 %  PULSE RANGE: Pulse  Avg: 79.9  Min: 64  Max: 86  BLOOD PRESSURE RANGE: Systolic (09BDZ), HGD:924 , Min:107 , QAS:341   ; Diastolic (96QIW), LNL:89, Min:47, Max:69    I/O (24Hr):    Intake/Output Summary (Last 24 hours) at 11/30/2020 1013  Last data filed at 11/29/2020 1745  Gross per 24 hour   Intake 660 ml   Output --   Net 660 ml       Objective:  General Appearance:  Comfortable and not in pain (Morbidly obese).    Vital signs: (most recent): Blood pressure 123/66, pulse 86, temperature 97.8 ??F (36.6 ??C), resp. rate 18, height '5\' 7"'  (1.702 m), weight 155 kg (341 lb 11.4 oz), SpO2 96 %.  No fever.    Output: Producing stool.    HEENT: Normal HEENT exam.    Lungs:  Normal effort and normal respiratory rate.  Breath sounds clear to auscultation.    Heart: Normal rate.  Regular rhythm.  S1 normal and S2 normal.  No murmur.   Abdomen: Abdomen is soft and non-distended.  Bowel sounds are normal.   There is no abdominal tenderness.     Extremities: Normal range of motion.  There is no dependent edema.    Pulses: Distal pulses are intact.    Neurological: Patient is alert and oriented to person, place and time.    Pupils:  Pupils are equal, round, and reactive to light.    Skin:  Warm and dry.     Labs/Imaging/Diagnostics    Labs:  CBC:  Recent Labs     11/30/20  0017 11/29/20  0121 11/28/20  0734   WBC 7.7 7.4 8.9   RBC 3.86 3.54* 3.72*   HGB 11.7 10.8* 11.4*   HCT 36.9 34.0* 37.2   MCV 95.6 96.0 100.0*   RDW 13.6 13.7 14.0   PLT 84* 67* 57*     CHEMISTRIES:  Recent Labs     11/30/20  0017 11/29/20  0121 11/28/20  0734   NA 140 144 142   K 4.1 3.6 4.0   CL 99 101 103   CO2 37* 40* 34*   BUN 16 19 22*   CA 8.9 8.1* 8.1*   PHOS  --  3.4  --    MG 1.9 1.5*  --    PT/INR:  Recent Labs     11/27/20  1314   INR 1.1     APTT:No results for input(s): APTT in the last 72 hours.  LIVER  PROFILE:  Recent Labs     11/28/20  0734 11/27/20  1313   AST 11* 13*   ALT 11* 13     Lab Results   Component Value Date/Time    ALT (SGPT) 11 (L) 11/28/2020 07:34 AM    AST (SGOT) 11 (L) 11/28/2020 07:34 AM    Alk. phosphatase 56 11/28/2020 07:34 AM    Bilirubin, total 3.5 (H) 11/28/2020 07:34 AM       Imaging Last 24 Hours:  No results found.  Assessment//Plan   Active Problems:    Thrombocytopenia ([place]) (04/30/2020)      Morbid obesity ([place]) (04/30/2020)      CKD (chronic kidney disease), stage III ([place]) (04/30/2020)      Pacemaker ()      Hypothyroid ()      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) ()      Chronic pain ()      Hyperlipidemia ()      GERD (gastroesophageal reflux disease) ()      CHF (congestive heart failure) ([place]) (11/27/2020)      Elevated bilirubin (11/27/2020)      Liver cirrhosis ([place]) (11/27/2020)      Colitis (11/27/2020)    Assessment:   Deborah [name] is a 65 y.o. female who is seen in consultation for abdominal pain, abnormal CT.  Deborah [name] has a myriad of medical problems; she states she is moved here about a year ago from [place] where she had her GI evaluation.  GI evaluation included colonoscopy; her understanding is nothing was found.  She has been told of liver cirrhosis; the only diagnosis linked to cirrhosis is her morbid obesity.    She presents to the hospital with abdominal pain, chest pain,  difficulty breathing; CT scan is abnormal with identification of colonic wall thickening.  She does not have diarrhea; in fact she has constipation with a couple of bowel movements per week.  She has not had vomiting, visible blood loss, fever.    11/28/2020: Cardiology consult and echocardiogram ordered.  She has ongoing mid abdominal tenderness to palpation.  Denies any significant rectal bleeding, has chronic constipation.  Reports having had an EGD at the same time as her colonoscopy a few years ago in [place], both of them were normal per her report.    11/29/2020: Cardiology evaluation ongoing.  EF 65-70 on echocardiogram.  Hemoglobin stable.  Abdominal pain better.  Bisacodyl ordered yesterday to help with constipation.    11/30/2020: Unhappy with prep that is ordered for colonoscopy but is agreeable to completing. Plan for colonoscopy tomorrow. No abdominal pain. No rectal bleeding. No nausea or vomiting. Had 20 beat run of VT this morning per nurses  note, cardiology is seeing. ).     Plan:    (- Go forward with prep for colonoscopy planned for tomorrow. Clear liquids today and NPO past midnight.   - Hold heparin in the morning).     Electronically signed by Joesph July, NP on 11/30/2020 at 9:46 AM    I have interviewed and examined patient with addendum to note above and formulation care plan to reflect my evaluation    Rachael Darby, M.D.

## 2020-11-30 NOTE — Progress Notes (Signed)
0730 Bedside and Verbal shift change report given to ANN-MARIE RN  (oncoming nurse) by Fleet Contras RN  (offgoing nurse). Report included the following information SBAR, Kardex, Intake/Output, MAR, Accordion, Recent Results, Med Rec Status, and Cardiac Rhythm APACED .     This patient was assisted with Intentional Toileting every 2 hours during this shift as appropriate.  Documentation of ambulation and output reflected on Flowsheet as appropriate.  Purposeful hourly rounding was completed using AIDET and 5Ps.  Outcomes of PHR documented as they occurred. Bed alarm in use as appropriate.  Dual Suction and ambubag in place.    7510 Notified MD that patient is refusing bowel prep, however her stool is clear with only some small brown flecks. Also sought clarification about patient's care plan, inquired about whether or not colonoscopy will be today. Also notified MD that patient had multiple runs of VTACH this morning, one 20 beats long.     1040 Notified cardiology that the patient's pacemaker seems to be undersensing and has spiked on T waves. Orders received to interrogate pacemaker.     1140 Interrogated pacemaker. Data sent to [employer].     1219 Updated patient's son [place] via telephone

## 2020-12-01 LAB — METABOLIC PANEL, BASIC
Anion gap: 2 mmol/L — ABNORMAL LOW (ref 5–15)
BUN/Creatinine ratio: 9 — ABNORMAL LOW (ref 12–20)
BUN: 14 MG/DL (ref 6–20)
CO2: 40 mmol/L — ABNORMAL HIGH (ref 21–32)
Calcium: 9 MG/DL (ref 8.5–10.1)
Chloride: 99 mmol/L (ref 97–108)
Creatinine: 1.48 MG/DL — ABNORMAL HIGH (ref 0.55–1.02)
Glucose: 122 mg/dL — ABNORMAL HIGH (ref 65–100)
Potassium: 4.2 mmol/L (ref 3.5–5.1)
Sodium: 141 mmol/L (ref 136–145)
eGFR: 40 mL/min/{1.73_m2} — ABNORMAL LOW (ref >60)

## 2020-12-01 LAB — GLUCOSE, POC
Glucose (POC): 143 mg/dL — ABNORMAL HIGH (ref 65–117)
Glucose (POC): 136 mg/dL — ABNORMAL HIGH (ref 65–117)
Glucose (POC): 143 mg/dL — ABNORMAL HIGH (ref 65–117)
Glucose (POC): 168 mg/dL — ABNORMAL HIGH (ref 65–117)

## 2020-12-01 LAB — MAGNESIUM
Magnesium: 1.9 mg/dL (ref 1.6–2.4)
Magnesium: 1.9 mg/dL (ref 1.6–2.4)

## 2020-12-01 LAB — BASIC METABOLIC PANEL
Anion Gap: 2 mmol/L — ABNORMAL LOW (ref 5–15)
BUN: 14 MG/DL (ref 6–20)
Bun/Cre Ratio: 9 — ABNORMAL LOW (ref 12–20)
CO2: 40 mmol/L — ABNORMAL HIGH (ref 21–32)
Calcium: 9 MG/DL (ref 8.5–10.1)
Chloride: 99 mmol/L (ref 97–108)
Creatinine: 1.48 MG/DL — ABNORMAL HIGH (ref 0.55–1.02)
ESTIMATED GLOMERULAR FILTRATION RATE: 40 mL/min/{1.73_m2} — ABNORMAL LOW (ref >60)
Glucose: 122 mg/dL — ABNORMAL HIGH (ref 65–100)
Potassium: 4.2 mmol/L (ref 3.5–5.1)
Sodium: 141 mmol/L (ref 136–145)

## 2020-12-01 LAB — POCT GLUCOSE
POC Glucose: 143 mg/dL — ABNORMAL HIGH (ref 65–117)
POC Glucose: 136 mg/dL — ABNORMAL HIGH (ref 65–117)
POC Glucose: 143 mg/dL — ABNORMAL HIGH (ref 65–117)
POC Glucose: 168 mg/dL — ABNORMAL HIGH (ref 65–117)

## 2020-12-01 MED ORDER — LIDOCAINE (PF) 20 MG/ML (2 %) IV SYRINGE
100 mg/5 mL (2 %) | INTRAVENOUS | Status: AC
Start: 2020-12-01 — End: ?

## 2020-12-01 MED ORDER — PROPOFOL 10 MG/ML IV EMUL
10 mg/mL | INTRAVENOUS | Status: AC
Start: 2020-12-01 — End: ?

## 2020-12-01 MED ORDER — AMIODARONE 200 MG TAB
200 mg | ORAL_TABLET | Freq: Two times a day (BID) | ORAL | 1 refills | Status: AC
Start: 2020-12-01 — End: 2020-12-31

## 2020-12-01 MED ADMIN — bumetanide (BUMEX) injection 2 mg: INTRAVENOUS | @ 13:00:00 | NDC 00641600701

## 2020-12-01 MED ADMIN — lidocaine (PF) (XYLOCAINE) 20 mg/mL (2 %) injection: INTRAVENOUS | @ 20:00:00 | NDC 00409206605

## 2020-12-01 MED ADMIN — bumetanide (BUMEX) tablet 2 mg: ORAL | @ 22:00:00 | NDC 69238149001

## 2020-12-01 MED ADMIN — 0.9% sodium chloride infusion: INTRAVENOUS | @ 22:00:00 | NDC 00338004903

## 2020-12-01 MED ADMIN — gabapentin (NEURONTIN) capsule 300 mg: ORAL | @ 13:00:00 | NDC 00904666661

## 2020-12-01 MED ADMIN — insulin lispro (HUMALOG) injection: SUBCUTANEOUS | @ 22:00:00 | NDC 00002751017

## 2020-12-01 MED ADMIN — propofoL (DIPRIVAN) 10 mg/mL injection: INTRAVENOUS | @ 20:00:00 | NDC 63323026950

## 2020-12-01 MED ADMIN — sodium chloride (NS) flush 5-40 mL: INTRAVENOUS | @ 02:00:00 | NDC 87701099893

## 2020-12-01 MED ADMIN — 0.9% sodium chloride infusion: INTRAVENOUS | @ 19:00:00 | NDC 00338004902

## 2020-12-01 MED ADMIN — gabapentin (NEURONTIN) capsule 300 mg: ORAL | @ 02:00:00 | NDC 00904666661

## 2020-12-01 MED ADMIN — insulin lispro (HUMALOG) injection: SUBCUTANEOUS | @ 16:00:00 | NDC 00002751017

## 2020-12-01 MED ADMIN — aspirin chewable tablet 81 mg: ORAL | @ 22:00:00 | NDC 00536100836

## 2020-12-01 MED ADMIN — sodium chloride (NS) flush 5-40 mL: INTRAVENOUS | @ 10:00:00 | NDC 87701099893

## 2020-12-01 MED ADMIN — gabapentin (NEURONTIN) capsule 300 mg: ORAL | @ 22:00:00 | NDC 00904666661

## 2020-12-01 MED ADMIN — propofoL (DIPRIVAN) 10 mg/mL injection: INTRAVENOUS | @ 20:00:00 | NDC 54569434000

## 2020-12-01 MED ADMIN — magnesium oxide (MAG-OX) tablet 400 mg: ORAL | @ 13:00:00 | NDC 96295013573

## 2020-12-01 MED ADMIN — sodium chloride (NS) flush 5-40 mL: INTRAVENOUS | @ 18:00:00 | NDC 87701099893

## 2020-12-01 MED ADMIN — montelukast (SINGULAIR) tablet 10 mg: ORAL | @ 02:00:00 | NDC 00904680861

## 2020-12-01 MED FILL — SODIUM CHLORIDE 0.9 % IV: INTRAVENOUS | Qty: 250

## 2020-12-01 MED FILL — SUCRALFATE 1 GRAM TAB: 1 gram | ORAL | Qty: 1

## 2020-12-01 MED FILL — GABAPENTIN 300 MG CAP: 300 mg | ORAL | Qty: 1

## 2020-12-01 MED FILL — LEVOTHYROXINE 25 MCG TAB: 25 mcg | ORAL | Qty: 1

## 2020-12-01 MED FILL — OXYCODONE 5 MG TAB: 5 mg | ORAL | Qty: 1

## 2020-12-01 MED FILL — INSULIN LISPRO 100 UNIT/ML INJECTION: 100 unit/mL | SUBCUTANEOUS | Qty: 2

## 2020-12-01 MED FILL — PIPERACILLIN-TAZOBACTAM 3.375 GRAM IV SOLR: 3.375 gram | INTRAVENOUS | Qty: 3.38

## 2020-12-01 MED FILL — PROPOFOL 10 MG/ML IV EMUL: 10 mg/mL | INTRAVENOUS | Qty: 50

## 2020-12-01 MED FILL — TRAZODONE 50 MG TAB: 50 mg | ORAL | Qty: 2

## 2020-12-01 MED FILL — MAGNESIUM OXIDE 400 MG TAB: 400 mg | ORAL | Qty: 1

## 2020-12-01 MED FILL — BUMETANIDE 1 MG TAB: 1 mg | ORAL | Qty: 2

## 2020-12-01 MED FILL — CITALOPRAM 20 MG TAB: 20 mg | ORAL | Qty: 2

## 2020-12-01 MED FILL — ASPIRIN 81 MG CHEWABLE TAB: 81 mg | ORAL | Qty: 1

## 2020-12-01 MED FILL — LIDOCAINE (PF) 20 MG/ML (2 %) IV SYRINGE: 100 mg/5 mL (2 %) | INTRAVENOUS | Qty: 5

## 2020-12-01 MED FILL — AMIODARONE 200 MG TAB: 200 mg | ORAL | Qty: 2

## 2020-12-01 MED FILL — IPRATROPIUM-ALBUTEROL 2.5 MG-0.5 MG/3 ML NEB SOLUTION: 2.5 mg-0.5 mg/3 ml | RESPIRATORY_TRACT | Qty: 3

## 2020-12-01 MED FILL — HEPARIN (PORCINE) 5,000 UNIT/ML IJ SOLN: 5000 unit/mL | INTRAMUSCULAR | Qty: 1

## 2020-12-01 MED FILL — MONTELUKAST 10 MG TAB: 10 mg | ORAL | Qty: 1

## 2020-12-01 MED FILL — METOPROLOL SUCCINATE SR 50 MG 24 HR TAB: 50 mg | ORAL | Qty: 1

## 2020-12-01 MED FILL — BUMETANIDE 0.25 MG/ML IJ SOLN: 0.25 mg/mL | INTRAMUSCULAR | Qty: 10

## 2020-12-01 NOTE — Progress Notes (Signed)
Deborah [name]  04-19-1955  [id number]    Situation:  Verbal report received from:   Ellery Plunk, RN   Procedure: Procedure(s):  COLONOSCOPY  COLON BIOPSY    Background:    Preoperative diagnosis: anemia  Postoperative diagnosis: 1- Ishemic Colitis  2- Diverticulosis.     Operator:  Dr. Nedra Hai  Assistant(s): Endoscopy Technician-1: Ayesha Rumpf  Endoscopy RN-1: Shelby Dubin    Specimens:   ID Type Source Tests Collected by Time Destination   1 : Hepatic Flexure Biopsy.  Preservative Hepatic Flexure  Ian Malkin, MD 12/01/2020 1540 Pathology     H. Pylori  no    Assessment:  Intra-procedure medications     Anesthesia gave intra-procedure sedation and medications, see anesthesia flow sheet yes    Intravenous fluids: NS@ KVO     Vital signs stable   yes    Abdominal assessment: round and soft   yes    Recommendation:  Discharge patient per MD order  inpatient .  Return to floor  yes  Family or Friend   n/a   Permission to share finding with family or friend n/a

## 2020-12-01 NOTE — Progress Notes (Signed)
Patient in endo recovery post procedure.   Patient has a nosebleed.   Endoscopy nurse at bedside holding pressure.

## 2020-12-01 NOTE — Progress Notes (Signed)
Endoscopy discharge instructions have been reviewed and given to patient.  The patient verbalized understanding and acceptance of instructions.      Dr. Lee discussed with patient procedure findings and next steps.

## 2020-12-01 NOTE — Progress Notes (Signed)
RRT Note    1830- Called by [facility] charge regarding pt continued R nare nose bleed despite having rhino rocket in place. Charge RN notified to call Anesthesiology to come to bedside for troubleshooting and RRT RN will meet there.   Kinsey.Precise- Anesthesiology MD F at bedside. Educated pt and Rns on rhinorocket and insufflate pilot balloon with approximately 10cc air. Per Primary RN Alinda Money, the MD plans to leave in rhino rocket for 24-48hrs.    2130- Primary RN Deanna spoke with RRT RN during RRT rounding. Pt R nare continues to bleed. Pt assessed. R nare still oozing bright red blood. Pilot balloon deflated from earlier. Per pt, after the Anesthesiology MD insufflated with air, it helped attenuate bleeding. RRT Rn with primary Rn at bedside, insufflated approx 5cc more air into pilot balloon to restore optimal compression. Pt re-educated on rhinorocket and questions answered.

## 2020-12-01 NOTE — Interval H&P Note (Signed)
Initial RN admission and assessment performed and documented in Endoscopy navigator.     Patient evaluated by anesthesia in pre-procedure holding.    All procedural vital signs, airway assessment, and level of consciousness information monitored and recorded by anesthesia staff on the anesthesia record.

## 2020-12-01 NOTE — Interval H&P Note (Signed)
Deborah [name]  1955/06/13  [id number]          Situation:  Verbal report received from: Zetta Bills  Preoperative diagnosis: anemia  Patient stated .    Background:  Procedure: Procedure(s):  COLONOSCOPY  Physician performing procedure; Dr. Nedra Hai    Patient diabetic yes [id number] at [phone]- 12/01/20  Patient taking blood thinners- Heparin 5,000 international units last given 11/30/20 at 22:23    Patient has a defibrillator: Pacemaker- AV Paced  Patient needs antibiotics: yes- Zosyn 3.375g due at 1000    Assessment:  Vital signs stable yes  Alert and oriented yes  Abdominal assessment: round and soft       Recommendation:  Endoscopic Procedure  Family or Friend yes  Permission to share finding with Family or Friend yes

## 2020-12-01 NOTE — Interval H&P Note (Signed)
Received report from Alcide Goodness and Shelby Mattocks, CRNA. See anesthesia record. Patient taken to post-recovery. Post-recovery report given to [facility], Charity fundraiser.   Patient's ABD remains soft and non-tender post procedure. Pt has no complaints at this time and tolerated the procedure well.  Endoscope was pre-cleaned at bedside immediately following procedure by Homero Fellers.   Patient had nasal trumphet in place from procedure placed by anesthesia in procedure room. Patient had nose bleed occur in procedure room and in post recovery area. Anesthesia, Dr. Merlinda Frederick notified and at bedside. Manual pressure held x20 minutes and ice pack applied by RN's. Nose bleed still occurring to right nostril. Dr. Nedra Hai aware and Dr. Merlinda Frederick aware and ordered nasal Rino. He to place it at bedside. Patient denies pain, shortness of breath or any discomfort. RN x 3 at bedside, holding manual pressure.

## 2020-12-01 NOTE — Progress Notes (Signed)
Spiritual Care Assessment/Progress Note  [facility]      NAME: Deborah [name]      MRN: [id number]  AGE: 65 y.o. SEX: female  Religious Affiliation: Other   Language: English     12/01/2020     Total Time (in minutes): 15     Spiritual Assessment begun in [facility] ENDOSCOPY through conversation with:         [x] Patient        []  Family    []  Friend(s)        Reason for Consult: Initial/Spiritual assessment, patient floor     Spiritual beliefs: (Please include comment if needed)     []  Identifies with a faith tradition:         []  Supported by a faith community:            []  Claims no spiritual orientation:           []  Seeking spiritual identity:                []  Adheres to an individual form of spirituality:           [x]  Not able to assess:                           Identified resources for coping:      []  Prayer                               []  Music                  []  Guided Imagery     []  Family/friends                 []  Pet visits     []  Devotional reading                         [x]  Unknown     []  Other:                                               Interventions offered during this visit: (See comments for more details)    Patient Interventions: Prayer (actual)           Plan of Care:     []  Support spiritual and/or cultural needs    []  Support AMD and/or advance care planning process      []  Support grieving process   []  Coordinate Rites and/or Rituals    []  Coordination with community clergy   []  No spiritual needs identified at this time   []  Detailed Plan of Care below (See Comments)  []  Make referral to Music Therapy  []  Make referral to Pet Therapy     []  Make referral to Addiction services  []  Make referral to [facility] Passages  []  Make referral to Spiritual Care Partner  []  No future visits requested        [x]  Contact Spiritual Care for further referrals     Comments: Chaplain initiated initial visit on [facility]. Reviewed chart. Pt not in the room at this time. Offered silent prayer on behalf of  pt at pt's door. Please contact Spiritual Care for further referrals.  Chaplain  Renold Genta, MS, MDiv, [facility]  Staff Chaplain  Paging service: [phone] (PRAY)

## 2020-12-01 NOTE — Interval H&P Note (Signed)
Received sbar from charge nurse, A. Ward, RN.

## 2020-12-01 NOTE — Progress Notes (Signed)
Progress Notes by Sharla Kidney, NP at 12/01/20 [phone]                Author: Sharla Kidney, NP  Service: Nurse Practitioner  Author Type: Nurse Practitioner       Filed: 12/01/20 0927  Date of Service: 12/01/20 0849  Status: Attested           Editor: Sharla Kidney, NP (Nurse Practitioner)  Cosigner: Victorino December, MD at 12/01/20 1048          Attestation signed by Victorino December, MD at 12/01/20 1048 (Updated)          Cardiology Attending:      Patient personally seen and examined. All the elements of history and examination were personally performed. Assessment and plan was discussed and agree as written above.          Briefly      Deborah [name] is a 65 y.o. female with PMH of AVR, PPM admitted with SOB.             AAOx3   Chest - clear to auscultation, no wheezes, rales or rhonchi   Heart - normal rate, regular rhythm, normal S1, S2, no murmurs, rubs, clicks or gallops   Abdomen - soft, nontender, nondistended, no masses or organomegaly   Extremities - peripheral pulses normal, no pedal edema         Assessment:      - HFpEF   - PVC   - NSVT   - AVR   - Morbid Obesity   - OSA   - ppm   - CKD         Plan:       - Need to get ischemia eval but due to body habitus it is difficult to get stress Nuc.      - We can get CCTA if renal will clear for a contrast dose of and if her PVC is better controlled.       - PVC and NSVT continue despite increase in Toprol XL. Added Amiodarone for short term.        - Could try CCTA as OP.       - Will sign off. Will setup OP CCTA       ___________________________________________________      Rod Can. Dagoberto Reef, MD, [facility]                                                                                                                    [facility] [place]   Cardiology Care Note                   []  Initial visit     [x] Established visit         Patient Name: Deborah [name] - DOB:01-May-1955 - Deborah [name]   Primary Cardiologist: Dr 01/25/1956    Consulting  Cardiologist: Dr PPI:[id number]         Reason for consult:  CHF       HPI:         Deborah [name] is a 65 y.o. female with PMH significant for aortic valve replacement, CKD, hypertension, CAD s/p PCI, permanent pacemaker who is admitted with symptoms of increasing shortness of breath  from last 3 to 4 days.        Patient noticed increasing abdominal girth and lower extremity edema despite taking diuretics which were not working from last 2 weeks.  Denies any symptoms of chest pain, palpitations, lightheadedness or dizziness.       SUBJECTIVE:        Deborah [name] to have colonoscopy today.          Assessment and Plan          1.  Acute heart failure preserved EF: Unclear etiology. TTE w/ nl LV function, EF 65-70%, unable to assess wall motion. Continue diuresis.  Needs daily standing weight and strict I/O. Will consider coronary CTA as OP as part of ischemic workup (pt unable to lie flat/have stress test), cont metoprolol XL      2. Freq PVC's: few runs of NSVT, pacer interrogated 11/30/20 - no episodes of R-on-T, functioning normally. Keep K > 4, mg > 2. Cont amio 400 mg bid x 1 week, then 400 mg daily x 1 week, then 200 mg daily. OP follow up with Dr. Daivd Council arranged. EP recommended  2 day lexiscan after review of pacemaker interrogation in August. She has chronically been unable to lie flat and would not be able to lie under nuclear camera now.       3. History of aortic valve replacement: Most recent echocardiogram demonstrated velocity > 3 m/sec appreciated across  aortic valve.       3. CKD: Cr at baseline, 1.48. Renal following       4. Morbid obesity: Contributing to shortness of breath and heart failure preserved EF.       5. Sleep apnea: Continue CPAP.       6. Permanent pacemaker: Functioning well on last check, interrogated again on 11/30/20      Will see prn through the weekend, pt may discharge from cardiac standpoint when otherwise medically ready               ____________________________________________________________      Cardiac testing         ECHO ADULT COMPLETE 11/28/2020 11/28/2020      Interpretation Summary   ?  Left Ventricle: Not well visualized. Normal left ventricular systolic function with a visually estimated EF  of 65 - 70%. Not well visualized. Left ventricle size is normal. Moderately increased wall thickness. Unable to assess wall motion.   ?  Right Ventricle: Not well visualized.   ?  Aortic Valve: Not well visualized. Bioprosthetic valve.  Suboptimal Doppler, however velocity > 3 m/sec appreciated across aortic valve.   ?  Technical qualifiers: Echo study was technically difficult with poor endocardial visualization and technically difficult due to patient's  body habitus.   ?  Contrast used: Definity.      Signed by: Candyce Churn, DO on 11/28/2020  2:54 PM         Most recent HS troponins:   No results for input(s): TROPHS in the last 72 hours.      No lab exists for component:  CKMB   EKG at the time of presentation demonstrated intermittent paced rhythm and PVCs in the form of bigeminy.  Troponin are negative, NT proBNP is 2198.      Review of Systems:      [x] All other  systems reviewed and all negative except as written in HPI      []  Patient unable to provide secondary to condition            Past Medical History:        Diagnosis  Date         ?  Anxiety and depression       ?  Aortic valve replaced       ?  Asthma       ?  Chronic narcotic use       ?  Chronic pain       ?  CKD (chronic kidney disease), stage III (HCC)       ?  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  GERD (gastroesophageal reflux disease)       ?  Hyperlipidemia       ?  Hypothyroid       ?  Morbid obesity (HCC)       ?  Neuropathy       ?  Pacemaker           ?  Rhinitis          No past surgical history on file.   Social Hx:  reports that she quit smoking about 10 years ago. Her smoking use included cigarettes. She has a 20.00 pack-year smoking history. She has  never used smokeless tobacco. She reports that she does not currently use alcohol. She reports that she  does not use drugs.   Family Hx: family history includes Hypertension in her father and mother.     Allergies        Allergen  Reactions         ?  Nitroglycerin  Unknown (comments)             hypotension         ?  Aloe Vera  Rash     ?  Hydrochlorothiazide  Other (comments)             Reports 'kidneys dry up"          ?  Tetanus And Diphther. Tox (Pf)  Swelling             Swelling of arm and it turns black              OBJECTIVE:     Wt Readings from Last 3 Encounters:        12/01/20  154.9 kg (341 lb 9.6 oz)     11/05/20  152 kg (335 lb)        08/26/20  152.4 kg (336 lb)           Intake/Output Summary (Last 24 hours) at 12/01/2020 0850   Last data filed at 11/30/2020 2332     Gross per 24 hour        Intake  1200 ml        Output  --        Net  1200 ml           Physical Exam:      Vitals:      Vitals:             11/30/20 2332  12/01/20 0435  12/01/20 0719  12/01/20 13/04/22  BP:  113/69  113/77    (!) 106/56     Pulse:  89  86    84     Resp:  Temp:  98.2 ??F (36.8 ??C)  97.3 ??F (36.3 ??C)    97.4 ??F (36.3 ??C)     SpO2:  92%  93%  93%  100%     Weight:        154.9 kg (341 lb 9.6 oz)           Height:                Telemetry: NSR freq PVC's, NSVT      Gen: Well-developed, well-nourished, in no acute distress   Neck: Supple, No JVD, No Carotid Bruit   Resp: No accessory muscle use, exp wheezing throughout    Card: Regular Rate, Rhythm, Normal S1, S2, No murmurs, rubs or gallop.    Abd:   obese, Soft, non-tender, non-distended, BS+    MSK: No cyanosis   Skin: No rashes     Neuro: Moving all four extremities, follows commands appropriately   Psych: Good insight, oriented to person, place, alert, Nml Affect   LE: + 1 LE       Data Review:       Radiology:    XR Results (most recent):   Results from Hospital Encounter encounter on 11/05/20      XR CHEST PORT      Narrative   EXAM:  XR  CHEST PORT      INDICATION: Chest pain      COMPARISON: 08/26/2020      TECHNIQUE: Upright portable chest AP view      FINDINGS: Left subclavian pacemaker and median sternotomy changes with cardiac   monitoring leads. Heart size is mildly enlarged. The pulmonary vasculature is   within normal limits.      The lungs and pleural spaces are clear. The visualized bones and upper abdomen   are age-appropriate.      Impression   No acute cardiopulmonary process.           Recent Labs             12/01/20   0511  11/30/20   0017  11/29/20   0121     NA  141  140  144     K  4.2  4.1  3.6     CL  99  99  101     CO2  40*  37*  40*     BUN  CREA  1.48*  1.42*  1.43*     GLU  122*  123*  98     PHOS   --    --   3.4          CA  9.0  8.9  8.1*          Recent Labs            11/30/20   0017  11/29/20   0121     WBC  7.7  7.4     HGB  11.7  10.8*     HCT  36.9  34.0*         PLT  84*  67*        No results for input(s): PTP, INR, AP, INREXT, INREXT in the last  72 hours.      No lab exists for component: PTTP, GPT, SGOT      No results for input(s): CHOL, LDLC in the last 72 hours.      No lab exists for component: TGL, HDLC,  HBA1C         Current meds:      Current Facility-Administered Medications:    ?  magnesium oxide (MAG-OX) tablet 400 mg, 400 mg, Oral, DAILY, Arria Naim, Georga Hacking, NP   ?  amiodarone (CORDARONE) tablet 400 mg, 400 mg, Oral, BID, Sherrie Mustache Georga Hacking, NP, 400 mg at 11/30/20 1750   ?  metoprolol succinate (TOPROL-XL) XL tablet 50 mg, 50 mg, Oral, DAILY, Rathi, Vikas K, MD   ?  albuterol-ipratropium (DUO-NEB) 2.5 MG-0.5 MG/3 ML, 3 mL, Nebulization, Q6HWA RT, Rathi, Vikas K, MD, 3 mL at 12/01/20 0719   ?  oxyCODONE IR (ROXICODONE) tablet 5 mg, 5 mg, Oral, Q4H PRN, [name], MD, 5 mg at 11/30/20 1607   ?  morphine injection 2 mg, 2 mg, IntraVENous, Q4H PRN, [name], MD   ?  citalopram (CELEXA) tablet 40 mg, 40 mg, Oral, DAILY, Tefera, Mesfin, MD, 40 mg at 11/30/20 0924   ?  gabapentin  (NEURONTIN) capsule 300 mg, 300 mg, Oral, TID, Tefera, Mesfin, MD, 300 mg at 11/30/20 2222   ?  levothyroxine (SYNTHROID) tablet 137 mcg, 137 mcg, Oral, ACB, Tefera, Mesfin, MD, 137 mcg at 12/01/20 0537   ?  montelukast (SINGULAIR) tablet 10 mg, 10 mg, Oral, QHS, Tefera, Mesfin, MD, 10 mg at 11/30/20 2222   ?  sucralfate (CARAFATE) tablet 1 g, 1 g, Oral, TID, Tefera, Mesfin, MD, 1 g at 11/30/20 2222   ?  traZODone (DESYREL) tablet 100 mg, 100 mg, Oral, QHS, Tefera, Mesfin, MD, 100 mg at 11/30/20 2222   ?  sodium chloride (NS) flush 5-40 mL, 5-40 mL, IntraVENous, Q8H, Tefera, Mesfin, MD, 10 mL at 12/01/20 0537   ?  sodium chloride (NS) flush 5-40 mL, 5-40 mL, IntraVENous, PRN, Tefera, Mesfin, MD   ?  acetaminophen (TYLENOL) tablet 650 mg, 650 mg, Oral, Q6H PRN **OR** acetaminophen (TYLENOL) suppository 650 mg, 650 mg, Rectal, Q6H PRN,  Tefera, Mesfin, MD   ?  polyethylene glycol (MIRALAX) packet 17 g, 17 g, Oral, DAILY PRN, Tefera, Mesfin, MD   ?  ondansetron (ZOFRAN ODT) tablet 4 mg, 4 mg, Oral, Q8H PRN **OR** ondansetron (ZOFRAN) injection 4 mg, 4 mg, IntraVENous, Q6H PRN, Tefera,  Mesfin, MD   ?  bumetanide (BUMEX) injection 2 mg, 2 mg, IntraVENous, BID, Tefera, Mesfin, MD, 2 mg at 11/30/20 1750   ?  piperacillin-tazobactam (ZOSYN) 3.375 g in 0.9% sodium chloride (MBP/ADV) 100 mL MBP, 3.375 g, IntraVENous, Q8H, Tefera, Mesfin, MD, Last  Rate: 25 mL/hr at 12/01/20 0216, 3.375 g at 12/01/20 0216   ?  insulin lispro (HUMALOG) injection, , SubCUTAneous, AC&HS, Mylo Red, Mesfin, MD, 2 Units at 11/30/20 1127   ?  glucose chewable tablet 16 g, 4 Tablet, Oral, PRN, Tefera, Mesfin, MD   ?  glucagon (GLUCAGEN) injection 1 mg, 1 mg, IntraMUSCular, PRN, Tefera, Mesfin, MD   ?  dextrose 10% infusion 0-250 mL, 0-250 mL, IntraVENous, PRN, Otho Darner, MD      Sharla Kidney, NP      [facility]   [address], [address]   [place], [place] [address]   [phone]         [id number], Jamesetta So  Talmadge Coventry, NP

## 2020-12-01 NOTE — Progress Notes (Signed)
0900: Per Cardiology NP give amiodarone and Bumex this AM due to pt lower BP- hold metoprolol and try to give later.    Per endo RN ok to give all meds with sip of water.    1200: Per cardiology NP give metoprolol now. Per endo RN ok to give with sip of water    1300: Pt off floor to endo.    1415: Bedside shift change report given to Alinda Money (Cabin crew) by Doreatha Martin (offgoing nurse). Report included the following information SBAR, Kardex, Intake/Output, MAR, Recent Results, and Cardiac Rhythm NSR .

## 2020-12-01 NOTE — Progress Notes (Signed)
Progress  Notes by Latricia Heft, MD at 12/01/20 1027                Author: Latricia Heft, MD  Service: Nephrology  Author Type: Physician       Filed: 12/01/20 1029  Date of Service: 12/01/20 1027  Status: Signed          Editor: Latricia Heft, MD (Physician)                                               [facility]     Deborah [name]   Date of Birth: [birth year]                Assessment & Plan:          CKD 3b, stable   HFpEF, still on oxygen. 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 No further workup at this time   Watch labs, avoid nephrotoxins             Subjective:     CC: follow up CKD   HPI: Creat stable. UOP not documented. She feels edema and sob are better. Still on a little NCO2.   ROS: SOB better, no n/v     Current Facility-Administered Medications          Medication  Dose  Route  Frequency           ?  magnesium oxide (MAG-OX) tablet 400 mg   400 mg  Oral  DAILY     ?  bumetanide (BUMEX) tablet 2 mg   2 mg  Oral  BID     ?  amiodarone (CORDARONE) tablet 400 mg   400 mg  Oral  BID     ?  metoprolol succinate (TOPROL-XL) XL tablet 50 mg   50 mg  Oral  DAILY           ?  albuterol-ipratropium (DUO-NEB) 2.5 MG-0.5 MG/3 ML   3 mL  Nebulization  Q6HWA RT           ?  oxyCODONE IR (ROXICODONE) tablet 5 mg   5 mg  Oral  Q4H PRN     ?  morphine injection 2 mg   2 mg  IntraVENous  Q4H PRN     ?  citalopram (CELEXA) tablet 40 mg   40 mg  Oral  DAILY     ?  gabapentin (NEURONTIN) capsule 300 mg   300 mg  Oral  TID     ?  levothyroxine (SYNTHROID) tablet 137 mcg   137 mcg  Oral  ACB     ?  montelukast (SINGULAIR) tablet 10 mg   10 mg  Oral  QHS     ?  sucralfate (CARAFATE) tablet 1 g   1 g  Oral  TID     ?  traZODone (DESYREL) tablet 100 mg   100 mg  Oral  QHS     ?  sodium chloride (NS) flush 5-40 mL   5-40 mL  IntraVENous  Q8H     ?  sodium  chloride (NS) flush 5-40 mL   5-40 mL  IntraVENous  PRN     ?  acetaminophen (TYLENOL) tablet 650 mg   650 mg  Oral  Q6H PRN          Or           ?  acetaminophen (TYLENOL) suppository 650 mg   650 mg  Rectal  Q6H PRN     ?  polyethylene glycol (MIRALAX) packet 17 g   17 g  Oral  DAILY PRN     ?  ondansetron (ZOFRAN ODT) tablet 4 mg   4 mg  Oral  Q8H PRN          Or           ?  ondansetron (ZOFRAN) injection 4 mg   4 mg  IntraVENous  Q6H PRN     ?  piperacillin-tazobactam (ZOSYN) 3.375 g in 0.9% sodium chloride (MBP/ADV) 100 mL MBP   3.375 g  IntraVENous  Q8H     ?  insulin lispro (HUMALOG) injection     SubCUTAneous  AC&HS     ?  glucose chewable tablet 16 g   4 Tablet  Oral  PRN           ?  glucagon (GLUCAGEN) injection 1 mg   1 mg  IntraMUSCular  PRN           ?  dextrose 10% infusion 0-250 mL   0-250 mL  IntraVENous  PRN                 Objective:        Vitals:   Blood pressure (!) 106/56, pulse 84, temperature 97.4 ??F (36.3 ??C), resp. rate 18,  height 5\' 7"  (1.702 m), weight 154.9 kg (341 lb 9.6 oz), SpO2 100 %.   Temp (24hrs), Avg:97.8 ??F (36.6 ??C), Min:97.3 ??F (36.3 ??C), Max:98.2 ??F (36.8 ??C)         Intake and Output:   No intake/output data recorded.   11/02 1901 - 11/04 0700   In: 1200 [P.O.:1200]   Out: -          Physical Exam:                GENERAL ASSESSMENT: NAD   HEENT: Nontraumatic    CHEST: On oxygen, Clear   HEART: S1S2   EXTREMITY: Decreased Swelling   NEURO: Grossly non focal                   ECG/rhythm:      Data Review         No results for input(s): TNIPOC in the last 72 hours.      No lab exists for component: ITNL    No results for input(s): CPK, CKMB, TROIQ in the last 72 hours.   Recent Labs         12/01/20   0511  11/30/20   0017  11/29/20   0121      NA  141  140  144      K  4.2  4.1  3.6      CL  99  99  101      CO2  40*  37*  40*      BUN  14  16  19       CREA  1.48*  1.42*  1.43*      GLU  122*  123*  98  PHOS   --    --   3.4      MG  1.9  1.9  1.5*      CA  9.0  8.9   8.1*      WBC   --   7.7  7.4      HGB   --   11.7  10.8*      HCT   --   36.9  34.0*      PLT   --   84*  67*                No results for input(s): INR, PTP, APTT, INREXT, INREXT in the last 72 hours.      Needs: urine analysis, urine sodium, protein and creatinine   Lab Results      Component  Value  Date/Time        Sodium,urine random  81  11/27/2020 07:14 PM        Creatinine, urine random  27.00  11/28/2020 04:49 PM                     : Latricia Heft, MD   12/01/2020            [facility]:   [email or web address]   [email or web address]      [place] office:   [address] [place], [address]   [place], [place] [address]   Phone: [phone]   Fax :     [phone]      [facility] office:   [address]   [place], [place] [address]   Phone - [phone]   Fax - [phone]

## 2020-12-01 NOTE — Progress Notes (Signed)
GI    Ischemic colitis noted on colonoscopy examination; 81 mg aspirin begun.  1500 -calorie/day diet ordered.    GI on call available as needed over the weekend.

## 2020-12-01 NOTE — Interval H&P Note (Signed)
Dr. Merlinda Frederick with anesthesia at bedside and placed a rapid Rhino in right nostril. Patient educated and tolerated well.

## 2020-12-01 NOTE — Progress Notes (Signed)
Care Management follow up     Patient admitted for CHF, colitis, abdominal pain, UTI.  History of: CHF, CKD, AVR, sleep apnea, diabetes, cirrhosis.     RUR 11 (Score %) low             Is This a Readmission NO  Is this a Bundle NO     Current status  Patient discussed during interdisciplinary rounds.  Patient continues to require medical management including ongoing assessment and monitoring.  Patient continues on IV antibiotics, oxygen weaned to room air.  Colonoscopy today.  Cardiology, GI, nephrology following.       Transition of Care Plan  Monitor patient status and response to treatment.  Patient continues to require medical management.  CM needs: none identified.  Lives with son, drives  Transport home per family.  CM to monitor progress and recommendations.    Midge Aver, RN, MSN/Care manager  [phone]

## 2020-12-01 NOTE — Procedures (Signed)
Procedures by Ian Malkin, MD at 12/01/20 1553                Author: Ian Malkin, MD  Service: Gastroenterology  Author Type: Physician       Filed: 12/01/20 1556  Date of Service: 12/01/20 1553  Status: Signed          Editor: Ian Malkin, MD (Physician)                                                   [facility]   [facility]   Ian Malkin, MD   [phone]        December 01, 2020      Colonoscopy Procedure Note   Deborah [name]   DOB:  Jul 25, 1955   [facility] Medical Record Number: [id number]      Indications:    Abdominal pain, abnormal CT scan   PCP:  Arcola Jansky, NP   Anesthesia/Sedation: see nursing notes   Endoscopist:  Dr. Ian Malkin   Assistants: None   Complications:  None   Estimate Blood Loss:  None      Permit:   The indications, risks, benefits and alternatives were reviewed with the patient or their decision maker who was provided an opportunity to ask questions and all questions were answered.  The specific  risks of colonoscopy with conscious sedation were reviewed, including but not limited to anesthetic complication, bleeding, adverse drug reaction, missed lesion, infection, IV site reactions, and intestinal perforation which would lead to the need for  surgical repair.  Alternatives to colonoscopy including radiographic imaging, observation without testing, or laboratory testing were reviewed including the limitations of those alternatives.  After considering the options and having all their questions  answered, the patient or their decision maker provided both verbal and written consent to proceed.           Procedure in Detail:   After obtaining informed consent, positioning of the patient in the left lateral decubitus position, and conduction of a pre-procedure pause or "time out" the endoscope was introduced into the anus  and advanced to the cecum, which was identified by the ileocecal valve.  The quality of the colonic  preparation was unsatisfactory; semisolid residual is present multiple levels of the colon; of note the patient refused a significant portion of her preparation  recommended for today's examination..  A careful inspection was made as the colonoscope was withdrawn, findings and interventions are described below.      Appendiceal orifice photographed      Findings:    Patchy ulceration of the transverse colon to the area of the hepatic flexure identified; no areas of cyanosis, gangrenous changes identified.  Few diverticulosis seen.  Given the aforementioned  limitations of the preparation no other pathology identified.      Specimens:      Biopsies of hepatic flexure area ulceration      Implants: None      Complications:    None; patient tolerated the procedure well.   Estimated blood loss: none      Impression:   Ischemic colitis; incidental finding colonic diverticulosis.      Recommendations:    And 81 mg daily aspirin   Because of comorbid medical conditions routine follow-up examination not recommended  Thank you for entrusting me with this patient's care.  Please do not hesitate to contact me with any questions or if I can be of assistance with any of your other patients' GI needs.      Signed By: Ian Malkin, MD                         December 01, 2020

## 2020-12-01 NOTE — Progress Notes (Signed)
Dr. Nedra Hai at bedside with patient.  Patient's nose continues to bleed.  Endo nurse holding pressure on nose.

## 2020-12-01 NOTE — Progress Notes (Signed)
1415  Verbal report received from [facility], RN. Pt was taken to endo at 1300.    1730  Pt back on floor transported by 2 endo RNs.     1900  Bedside and Verbal shift change report given to Lawson Fiscal, RN (oncoming nurse) by Nettie Elm, RN (offgoing nurse). Report included the following information SBAR, Kardex, Procedure Summary, Intake/Output, MAR, Accordion, Recent Results, and Med Rec Status.

## 2020-12-01 NOTE — Anesthesia Pre-Procedure Evaluation (Signed)
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 Normal left ventricular systolic function with a visually estimated EF of 65 - 70%. Not well visualized. Left ventricle size is normal. Moderately increased wall thickness. Unable to assess wall motion.  ???  Right??Ventricle: Not well visualized.  ???  Aortic??Valve: Not well visualized. Bioprosthetic valve.  Suboptimal Doppler, however velocity > 3 m/sec appreciated across aortic valve.  ???  Technical qualifiers: Echo study was technically difficult with poor endocardial visualization and technically difficult due to patient's body habitus.  ???  Contrast used: Definity.     GI/Hepatic/Renal         Renal disease: CRI  Liver disease (cirrhosis)     Endo/Other    Diabetes  Hypothyroidism  Morbid obesity     Other Findings   Comments: thrombocytopenia           Physical Exam    Airway  Mallampati: II  TM Distance: 4 - 6 cm  Neck ROM: normal range of motion   Mouth opening: Normal     Cardiovascular    Rhythm: regular  Rate: normal         Dental    Dentition: Full upper  dentures     Pulmonary  Breath sounds clear to auscultation               Abdominal         Other Findings            Anesthetic Plan    ASA: 3  Anesthesia type: MAC          Induction: Intravenous  Anesthetic plan and risks discussed with: Patient

## 2020-12-01 NOTE — Interval H&P Note (Signed)
TRANSFER - OUT REPORT:    Verbal report given to Alinda Money, RN (name) on Deborah [name]  being transferred to 3rd floor, bed 325 (unit) for routine progression of care       Report consisted of patient's Situation, Background, Assessment and   Recommendations(SBAR).     Information from the following report(s) SBAR, Procedure Summary, and Recent Results was reviewed with the receiving nurse.    Lines:   Peripheral IV 11/28/20 Anterior;Right Forearm (Active)   Site Assessment Clean, dry, & intact 12/01/20 1317   Phlebitis Assessment 0 12/01/20 1317   Infiltration Assessment 0 12/01/20 1317   Dressing Status Clean, dry, & intact 12/01/20 1317   Dressing Type Tape;Transparent 12/01/20 1317   Hub Color/Line Status Patent;Pink;Infusing;Flushed 12/01/20 1317   Action Taken Open ports on tubing capped 12/01/20 1317   Alcohol Cap Used Yes 12/01/20 1317        Opportunity for questions and clarification was provided.      Patient transported with:   Registered Nurse

## 2020-12-01 NOTE — Progress Notes (Signed)
Patient stable and transported back to room 325 by endo nurses.

## 2020-12-01 NOTE — Progress Notes (Signed)
Progress  Notes by Carrolyn Meiers, MD at 12/01/20 1008                Author: Carrolyn Meiers, MD  Service: Internal Medicine  Author Type: Physician       Filed: 12/01/20 1012  Date of Service: 12/01/20 1008  Status: Signed          Editor: Carrolyn Meiers, MD (Physician)                    [facility]   [address] [place] [place], [place]  [address]   [phone]             Hospitalist Progress Note            NAME:  Deborah [name]    DOB:  04-Apr-1955    MRN:  [id number]      Date/Time:  12/01/2020       Patient PCP:  Arcola Jansky, NP      Emergency Contact:     Extended Emergency Contact Information   Primary Emergency Contact: [facility] Phone: [phone]   Mobile Phone: [phone]   Relation: Son   Interpreter needed? No        Code: Full Code       Isolation Precautions: There are currently no Active Isolations                Subjective:        REASON FOR VISIT:  Recheck SOB        HPI & INTERVAL HISTORY:      Deborah [name] is a 65 y.o. female with history that includes DM, CKD, chronic pain, hypothyroidism, and morbid obesity presents with dyspnea, leg swelling, weight gain, and abdominal pain      11/4: Resting in bed on 2 L scheduled for colonoscopy today denies any SOB.       11/03: Patient seen and examined.  No acute events overnight.  There was confusion regarding patient's colonoscopy sched for tomorrow but was prepped overnight.       11/02: Patient is looking and feeling better.  Blood pressure remains somewhat low but she states that she normally runs in the 90s.  Shortness of breath is improving.      11/01: Patient seen and examined.  Complains of mild to moderate generalized abdominal pain which is constant dull and achy without radiation.  Associated with nausea but no vomiting.  Has had loose bowel movements with this.  Still has a lot of edema/anasarca  but breathing is improving with diuresis.   Echo 11/28/20:   ?  Left Ventricle: Not well  visualized. Normal left ventricular systolic function with a visually estimated EF of 65 - 70%. Not well visualized. Left ventricle  size is normal. Moderately increased wall thickness. Unable to assess wall motion.   ?  Right Ventricle: Not well visualized.   ?  Aortic Valve: Not well visualized. Bioprosthetic valve.  Suboptimal Doppler, however velocity > 3 m/sec appreciated across aortic valve.   ?  Technical qualifiers: Echo study was technically difficult with poor endocardial visualization and technically difficult due to patient's body habitus.   ?  Contrast used: Definity.            ALLERGIES     Allergies        Allergen  Reactions         ?  Nitroglycerin  Unknown (comments)  hypotension         ?  Aloe Vera  Rash     ?  Hydrochlorothiazide  Other (comments)             Reports 'kidneys dry up"          ?  Tetanus And Diphther. Tox (Pf)  Swelling             Swelling of arm and it turns black              ROS:   Gen:   Negative   Resp:  shortness of breath   CVS:   edema and DOE   GI:       abdominal pain, denies melena, and denies hematochezia   GU:     negative                  Objective:         Visit Vitals      BP  (!) 106/56 (BP 1 Location: Left upper arm, BP Patient Position: At rest)     Pulse  84     Temp  97.4 ??F (36.3 ??C)     Resp  18     Ht  5\' 7"  (1.702 m)     Wt  154.9 kg (341 lb 9.6 oz)     SpO2  100%        BMI  53.50 kg/m??           Physical Exam:   General: NAD   Head: Normocephalic, without obvious abnormality, atraumatic   Eyes: anicteric sclerae and conjuntiva clear   ENT: lips, mucosa, and tongue normal   Neck: normal, supple, and no tenderness   Lungs: clear to auscultation   Heart: S1, S2 normal, regular rate, regular rhythm, and systolic murmur present   Abd: not distended, soft, generalized tenderness to palpation, with guarding, no rebound, BS present and normactive   Ext: no cyanosis and bilateral lower extremity 2+ edema   Skin: normal skin color, no rashes, and  texture normal   Neuro:  alert, oriented, no defects noted in general exam.   Psych: not anxious, cooperative, appropriate affect         Medications:     Current Facility-Administered Medications          Medication  Dose  Route  Frequency           ?  magnesium oxide (MAG-OX) tablet 400 mg   400 mg  Oral  DAILY     ?  amiodarone (CORDARONE) tablet 400 mg   400 mg  Oral  BID     ?  metoprolol succinate (TOPROL-XL) XL tablet 50 mg   50 mg  Oral  DAILY     ?  albuterol-ipratropium (DUO-NEB) 2.5 MG-0.5 MG/3 ML   3 mL  Nebulization  Q6HWA RT     ?  oxyCODONE IR (ROXICODONE) tablet 5 mg   5 mg  Oral  Q4H PRN     ?  morphine injection 2 mg   2 mg  IntraVENous  Q4H PRN     ?  citalopram (CELEXA) tablet 40 mg   40 mg  Oral  DAILY     ?  gabapentin (NEURONTIN) capsule 300 mg   300 mg  Oral  TID     ?  levothyroxine (SYNTHROID) tablet 137 mcg   137 mcg  Oral  ACB     ?  montelukast (SINGULAIR) tablet 10 mg   10 mg  Oral  QHS     ?  sucralfate (CARAFATE) tablet 1 g   1 g  Oral  TID     ?  traZODone (DESYREL) tablet 100 mg   100 mg  Oral  QHS     ?  sodium chloride (NS) flush 5-40 mL   5-40 mL  IntraVENous  Q8H           ?  sodium chloride (NS) flush 5-40 mL   5-40 mL  IntraVENous  PRN           ?  acetaminophen (TYLENOL) tablet 650 mg   650 mg  Oral  Q6H PRN          Or           ?  acetaminophen (TYLENOL) suppository 650 mg   650 mg  Rectal  Q6H PRN     ?  polyethylene glycol (MIRALAX) packet 17 g   17 g  Oral  DAILY PRN     ?  ondansetron (ZOFRAN ODT) tablet 4 mg   4 mg  Oral  Q8H PRN          Or           ?  ondansetron (ZOFRAN) injection 4 mg   4 mg  IntraVENous  Q6H PRN     ?  bumetanide (BUMEX) injection 2 mg   2 mg  IntraVENous  BID     ?  piperacillin-tazobactam (ZOSYN) 3.375 g in 0.9% sodium chloride (MBP/ADV) 100 mL MBP   3.375 g  IntraVENous  Q8H     ?  insulin lispro (HUMALOG) injection     SubCUTAneous  AC&HS     ?  glucose chewable tablet 16 g   4 Tablet  Oral  PRN     ?  glucagon (GLUCAGEN) injection 1 mg   1  mg  IntraMUSCular  PRN           ?  dextrose 10% infusion 0-250 mL   0-250 mL  IntraVENous  PRN            Labs:     Recent Labs           11/30/20   0017     WBC  7.7     HGB  11.7     HCT  36.9        PLT  84*             Recent Labs             12/01/20   0511  11/30/20   0017  11/29/20   0121     NA  141    < >  144     K  4.2    < >  3.6     CL  99    < >  101     CO2  40*    < >  40*     GLU  122*    < >  98     BUN  14    < >  19     CREA  1.48*    < >  1.43*     CA  9.0    < >  8.1*     MG  1.9    < >  1.5*     PHOS   --    --  3.4        < > = values in this interval not displayed.                 Radiology:   No results found.            The chart, labs, imaging studies, medications, and consultants notes was reviewed by me on: December 01, 2020                  Assessment/Plan:         Acute respiratory failure with hypoxia due to acute HFpEF with exertional dyspnea, fluid overload/anasarca, weight gain, and elevated proBNP:    Improving  Continue diuresis change Bumex to PO  Monitor daily weights and I's and O's   CT of the chest: Trace pleural effusion, no PE.     Continue supplemental oxygen to keep SaO2 greater than 90%.    Echocardiogram EF 65-70%   Will need a 2-day Lexiscan but patient unable to tolerate at this time.   Cardiology consulted        Aortic valve replaced / Pacemaker:   Cardiology consulted.        Abdominal pain /possible colitis:   CT scan of the abdomen shows colitis.     Continue Zosyn for today    Continue bisacodyl   Continue Roxicodone for mod pain and morphine IV for severe pain   Full liquid diet   GI consulted possible colonoscopy 11/04       DM type 2 causing renal disease:   A1c 6.1.  Well-controlled   Continue ISS      CKD stage III:   Avoid nephrotoxic drugs.  Watch Cr as pt was given contrast for CT scan.  Check urine lytes and consult nephrology. Notes reviewed and appreciated ok to continue with diuresis       Liver cirrhosis / Elevated bilirubin    Likely d/t morbid  obesity CT of the abdomen shows: Hepatic steatosis and cirrhotic change.     Avoid hepatotoxic drugs.  Holding statin for now.       Thrombocytopenia:     Likely secondary to cirrhosis       Hypothyroidism:   Continue Synthroid. Check TSH        Hyperlipidemia:   On statin but will hold for now       Morbid obesity / OSA:    Uses CPAP.       Chronic pain:   Continue pain medicine.        F: No fluids   E: Monitor especially on diuretic   N: DIET NPO          Risk of deterioration: high        Discussed:  Pt's condition, Imaging findings, Lab findings, Assessment, Care Plan, and D/C Planning discussed with: Patient, Family at bedsude, RN, and Care Manager      Prophylaxis:  Hep SQ      Anticipated discharge disposition:  Home with family      Discharge barriers: HR DC Barriers: Currently not medically stable for discharge         Total time: 30 minutes **I personally saw and examined the patient during this time period**         Date of service:    12/01/2020                    ___________________________________________________      Admitting Physician: Carrolyn Meiers,  MD

## 2020-12-02 LAB — METABOLIC PANEL, BASIC
Anion gap: 3 mmol/L — ABNORMAL LOW (ref 5–15)
BUN/Creatinine ratio: 12 (ref 12–20)
BUN: 18 MG/DL (ref 6–20)
CO2: 38 mmol/L — ABNORMAL HIGH (ref 21–32)
Calcium: 8.9 MG/DL (ref 8.5–10.1)
Chloride: 98 mmol/L (ref 97–108)
Creatinine: 1.47 MG/DL — ABNORMAL HIGH (ref 0.55–1.02)
Glucose: 130 mg/dL — ABNORMAL HIGH (ref 65–100)
Potassium: 3.7 mmol/L (ref 3.5–5.1)
Sodium: 139 mmol/L (ref 136–145)
eGFR: 40 mL/min/{1.73_m2} — ABNORMAL LOW (ref >60)

## 2020-12-02 LAB — GLUCOSE, POC
Glucose (POC): 131 mg/dL — ABNORMAL HIGH (ref 65–117)
Glucose (POC): 137 mg/dL — ABNORMAL HIGH (ref 65–117)
Glucose (POC): 196 mg/dL — ABNORMAL HIGH (ref 65–117)
Glucose (POC): 152 mg/dL — ABNORMAL HIGH (ref 65–117)

## 2020-12-02 LAB — CBC W/O DIFF
ABSOLUTE NRBC: 0 10*3/uL (ref 0.00–0.01)
HCT: 37.3 % (ref 35.0–47.0)
HGB: 11.7 g/dL (ref 11.5–16.0)
MCH: 29.9 PG (ref 26.0–34.0)
MCHC: 31.4 g/dL (ref 30.0–36.5)
MCV: 95.4 FL (ref 80.0–99.0)
MPV: 10.7 FL (ref 8.9–12.9)
NRBC: 0 PER 100 WBC
PLATELET: 90 10*3/uL — ABNORMAL LOW (ref 150–400)
RBC: 3.91 M/uL (ref 3.80–5.20)
RDW: 13.6 % (ref 11.5–14.5)
WBC: 7.6 10*3/uL (ref 3.6–11.0)

## 2020-12-02 LAB — MAGNESIUM
Magnesium: 2 mg/dL (ref 1.6–2.4)
Magnesium: 2 mg/dL (ref 1.6–2.4)

## 2020-12-02 LAB — BASIC METABOLIC PANEL
Anion Gap: 3 mmol/L — ABNORMAL LOW (ref 5–15)
BUN: 18 MG/DL (ref 6–20)
Bun/Cre Ratio: 12 (ref 12–20)
CO2: 38 mmol/L — ABNORMAL HIGH (ref 21–32)
Calcium: 8.9 MG/DL (ref 8.5–10.1)
Chloride: 98 mmol/L (ref 97–108)
Creatinine: 1.47 MG/DL — ABNORMAL HIGH (ref 0.55–1.02)
ESTIMATED GLOMERULAR FILTRATION RATE: 40 mL/min/{1.73_m2} — ABNORMAL LOW (ref >60)
Glucose: 130 mg/dL — ABNORMAL HIGH (ref 65–100)
Potassium: 3.7 mmol/L (ref 3.5–5.1)
Sodium: 139 mmol/L (ref 136–145)

## 2020-12-02 LAB — POCT GLUCOSE
POC Glucose: 131 mg/dL — ABNORMAL HIGH (ref 65–117)
POC Glucose: 137 mg/dL — ABNORMAL HIGH (ref 65–117)
POC Glucose: 196 mg/dL — ABNORMAL HIGH (ref 65–117)
POC Glucose: 152 mg/dL — ABNORMAL HIGH (ref 65–117)

## 2020-12-02 LAB — CBC
Hematocrit: 37.3 % (ref 35.0–47.0)
Hemoglobin: 11.7 g/dL (ref 11.5–16.0)
MCH: 29.9 PG (ref 26.0–34.0)
MCHC: 31.4 g/dL (ref 30.0–36.5)
MCV: 95.4 FL (ref 80.0–99.0)
MPV: 10.7 FL (ref 8.9–12.9)
NRBC Absolute: 0 10*3/uL (ref 0.00–0.01)
Nucleated RBCs: 0 PER 100 WBC
Platelets: 90 10*3/uL — ABNORMAL LOW (ref 150–400)
RBC: 3.91 M/uL (ref 3.80–5.20)
RDW: 13.6 % (ref 11.5–14.5)
WBC: 7.6 10*3/uL (ref 3.6–11.0)

## 2020-12-02 MED ADMIN — bumetanide (BUMEX) tablet 2 mg: ORAL | @ 12:00:00 | NDC 69238149001

## 2020-12-02 MED ADMIN — montelukast (SINGULAIR) tablet 10 mg: ORAL | @ 02:00:00 | NDC 00904680861

## 2020-12-02 MED ADMIN — sodium chloride (NS) flush 5-40 mL: INTRAVENOUS | @ 02:00:00 | NDC 87701099893

## 2020-12-02 MED ADMIN — gabapentin (NEURONTIN) capsule 300 mg: ORAL | @ 02:00:00 | NDC 00904666661

## 2020-12-02 MED ADMIN — sodium chloride (NS) flush 5-40 mL: INTRAVENOUS | @ 21:00:00 | NDC 87701099893

## 2020-12-02 MED ADMIN — aspirin chewable tablet 81 mg: ORAL | @ 12:00:00 | NDC 00536100836

## 2020-12-02 MED ADMIN — gabapentin (NEURONTIN) capsule 300 mg: ORAL | @ 21:00:00 | NDC 00904666661

## 2020-12-02 MED ADMIN — insulin lispro (HUMALOG) injection: SUBCUTANEOUS | @ 21:00:00 | NDC 00002751017

## 2020-12-02 MED ADMIN — magnesium oxide (MAG-OX) tablet 400 mg: ORAL | @ 12:00:00 | NDC 96295013573

## 2020-12-02 MED ADMIN — insulin lispro (HUMALOG) injection: SUBCUTANEOUS | @ 16:00:00 | NDC 00002751017

## 2020-12-02 MED ADMIN — gabapentin (NEURONTIN) capsule 300 mg: ORAL | @ 12:00:00 | NDC 00904666661

## 2020-12-02 MED ADMIN — sodium chloride (NS) flush 5-40 mL: INTRAVENOUS | @ 10:00:00 | NDC 87701099893

## 2020-12-02 MED ADMIN — bumetanide (BUMEX) tablet 2 mg: ORAL | @ 21:00:00 | NDC 69238149001

## 2020-12-02 MED FILL — IPRATROPIUM-ALBUTEROL 2.5 MG-0.5 MG/3 ML NEB SOLUTION: 2.5 mg-0.5 mg/3 ml | RESPIRATORY_TRACT | Qty: 3

## 2020-12-02 MED FILL — PIPERACILLIN-TAZOBACTAM 3.375 GRAM IV SOLR: 3.375 gram | INTRAVENOUS | Qty: 3.38

## 2020-12-02 MED FILL — SUCRALFATE 1 GRAM TAB: 1 gram | ORAL | Qty: 1

## 2020-12-02 MED FILL — GABAPENTIN 300 MG CAP: 300 mg | ORAL | Qty: 1

## 2020-12-02 MED FILL — OXYCODONE 5 MG TAB: 5 mg | ORAL | Qty: 1

## 2020-12-02 MED FILL — TRAZODONE 50 MG TAB: 50 mg | ORAL | Qty: 2

## 2020-12-02 MED FILL — LEVOTHYROXINE 25 MCG TAB: 25 mcg | ORAL | Qty: 1

## 2020-12-02 MED FILL — MAGNESIUM OXIDE 400 MG TAB: 400 mg | ORAL | Qty: 1

## 2020-12-02 MED FILL — INSULIN LISPRO 100 UNIT/ML INJECTION: 100 unit/mL | SUBCUTANEOUS | Qty: 2

## 2020-12-02 MED FILL — MONTELUKAST 10 MG TAB: 10 mg | ORAL | Qty: 1

## 2020-12-02 MED FILL — BUMETANIDE 1 MG TAB: 1 mg | ORAL | Qty: 2

## 2020-12-02 MED FILL — METOPROLOL SUCCINATE SR 50 MG 24 HR TAB: 50 mg | ORAL | Qty: 1

## 2020-12-02 MED FILL — AMIODARONE 200 MG TAB: 200 mg | ORAL | Qty: 2

## 2020-12-02 MED FILL — CITALOPRAM 20 MG TAB: 20 mg | ORAL | Qty: 2

## 2020-12-02 MED FILL — ASPIRIN 81 MG CHEWABLE TAB: 81 mg | ORAL | Qty: 1

## 2020-12-02 NOTE — Progress Notes (Signed)
 0700  Bedside and Verbal shift change report given to Koren Duos, RN (oncoming nurse) by Cathryne Lunger, RN (offgoing nurse). Report included the following information SBAR, Kardex, Procedure Summary, Intake/Output, MAR, Recent Results, and Med Rec Status.     1635  TRANSFER - OUT REPORT:    Verbal report given to [place], RN (name) on Deborah [name]  being transferred to Ortho (4th floor) (unit) for routine progression of care       Report consisted of patient's Situation, Background, Assessment and   Recommendations(SBAR).     Information from the following report(s) SBAR, Kardex, Intake/Output, and Cardiac Rhythm NSR  was reviewed with the receiving nurse.    Lines:   Peripheral IV 11/28/20 Anterior;Right Forearm (Active)   Site Assessment Clean, dry, & intact 12/02/20 1525   Phlebitis Assessment 0 12/02/20 1525   Infiltration Assessment 0 12/02/20 1525   Dressing Status Clean, dry, & intact 12/02/20 1525   Dressing Type Tape;Transparent 12/02/20 1525   Hub Color/Line Status Patent;Pink;Infusing;Flushed 12/02/20 1525   Action Taken Open ports on tubing capped 12/02/20 1525   Alcohol Cap Used Yes 12/02/20 1525        Opportunity for questions and clarification was provided.      Patient transported with:   Monitor

## 2020-12-02 NOTE — Progress Notes (Signed)
Progress  Notes by Carrolyn Meiers, MD at 12/02/20 0845                Author: Carrolyn Meiers, MD  Service: Internal Medicine  Author Type: Physician       Filed: 12/02/20 0847  Date of Service: 12/02/20 0845  Status: Signed          Editor: Carrolyn Meiers, MD (Physician)                    [facility]   [address] [place] [place], [place]  [address]   [phone]             Hospitalist Progress Note            NAME:  Deborah [name]    DOB:  06/29/1955    MRN:  [id number]      Date/Time:  12/02/2020       Patient PCP:  Arcola Jansky, NP      Emergency Contact:     Extended Emergency Contact Information   Primary Emergency Contact: [facility] Phone: [phone]   Mobile Phone: [phone]   Relation: Son   Interpreter needed? No        Code: Full Code       Isolation Precautions: There are currently no Active Isolations                Subjective:        REASON FOR VISIT:  Recheck SOB        HPI & INTERVAL HISTORY:      Deborah [name] is a 65 y.o. female with history that includes DM, CKD, chronic pain, hypothyroidism, and morbid obesity presents with dyspnea, leg swelling, weight gain, and abdominal pain      11/5: Colonoscopy suggestive of ischemic colitis. She has Rhino rocket due to nose bleed. Hold ASA (Low Plat and Nosebleed) Can resume ASA once nose bleed stops. Has Good BM. Stop Zosyn.      11/4: Resting in bed on 2 L scheduled for colonoscopy today denies any SOB.       11/03: Patient seen and examined.  No acute events overnight.  There was confusion regarding patient's colonoscopy sched for tomorrow but was prepped overnight.       11/02: Patient is looking and feeling better.  Blood pressure remains somewhat low but she states that she normally runs in the 90s.  Shortness of breath is improving.      11/01: Patient seen and examined.  Complains of mild to moderate generalized abdominal pain which is constant dull and achy without radiation.  Associated with nausea  but no vomiting.  Has had loose bowel movements with this.  Still has a lot of edema/anasarca  but breathing is improving with diuresis.   Echo 11/28/20:   ?  Left Ventricle: Not well visualized. Normal left ventricular systolic function with a visually estimated EF of 65 - 70%. Not well visualized. Left ventricle  size is normal. Moderately increased wall thickness. Unable to assess wall motion.   ?  Right Ventricle: Not well visualized.   ?  Aortic Valve: Not well visualized. Bioprosthetic valve.  Suboptimal Doppler, however velocity > 3 m/sec appreciated across aortic valve.   ?  Technical qualifiers: Echo study was technically difficult with poor endocardial visualization and technically difficult due to patient's body habitus.   ?  Contrast used: Definity.  ALLERGIES     Allergies        Allergen  Reactions         ?  Nitroglycerin  Unknown (comments)             hypotension         ?  Aloe Vera  Rash     ?  Hydrochlorothiazide  Other (comments)             Reports 'kidneys dry up"          ?  Tetanus And Diphther. Tox (Pf)  Swelling             Swelling of arm and it turns black              ROS:   Gen:   Negative   Resp:  shortness of breath   CVS:   edema and DOE   GI:       abdominal pain, denies melena, and denies hematochezia   GU:     negative                  Objective:         Visit Vitals      BP  137/72 (BP 1 Location: Left lower arm, BP Patient Position: Semi fowlers)     Pulse  82     Temp  98.6 ??F (37 ??C)     Resp  18     Ht  5\' 7"  (1.702 m)     Wt  154.9 kg (341 lb 7.9 oz)     SpO2  99%        BMI  53.49 kg/m??           Physical Exam:   General: NAD   Head: Normocephalic, without obvious abnormality, atraumatic   Eyes: anicteric sclerae and conjuntiva clear   ENT: lips, mucosa, and tongue normal   Neck: normal, supple, and no tenderness   Lungs: clear to auscultation   Heart: S1, S2 normal, regular rate, regular rhythm, and systolic murmur present   Abd: not distended, soft, generalized  tenderness to palpation, with guarding, no rebound, BS present and normactive   Ext: no cyanosis and bilateral lower extremity 2+ edema   Skin: normal skin color, no rashes, and texture normal   Neuro:  alert, oriented, no defects noted in general exam.   Psych: not anxious, cooperative, appropriate affect         Medications:     Current Facility-Administered Medications          Medication  Dose  Route  Frequency           ?  magnesium oxide (MAG-OX) tablet 400 mg   400 mg  Oral  DAILY     ?  bumetanide (BUMEX) tablet 2 mg   2 mg  Oral  BID     ?  [Held by provider] aspirin chewable tablet 81 mg   81 mg  Oral  DAILY     ?  amiodarone (CORDARONE) tablet 400 mg   400 mg  Oral  BID     ?  metoprolol succinate (TOPROL-XL) XL tablet 50 mg   50 mg  Oral  DAILY     ?  albuterol-ipratropium (DUO-NEB) 2.5 MG-0.5 MG/3 ML   3 mL  Nebulization  Q6HWA RT     ?  oxyCODONE IR (ROXICODONE) tablet 5 mg   5 mg  Oral  Q4H PRN     ?  morphine injection 2 mg   2 mg  IntraVENous  Q4H PRN     ?  citalopram (CELEXA) tablet 40 mg   40 mg  Oral  DAILY     ?  gabapentin (NEURONTIN) capsule 300 mg   300 mg  Oral  TID     ?  levothyroxine (SYNTHROID) tablet 137 mcg   137 mcg  Oral  ACB           ?  montelukast (SINGULAIR) tablet 10 mg   10 mg  Oral  QHS           ?  sucralfate (CARAFATE) tablet 1 g   1 g  Oral  TID     ?  traZODone (DESYREL) tablet 100 mg   100 mg  Oral  QHS     ?  sodium chloride (NS) flush 5-40 mL   5-40 mL  IntraVENous  Q8H     ?  sodium chloride (NS) flush 5-40 mL   5-40 mL  IntraVENous  PRN     ?  acetaminophen (TYLENOL) tablet 650 mg   650 mg  Oral  Q6H PRN          Or           ?  acetaminophen (TYLENOL) suppository 650 mg   650 mg  Rectal  Q6H PRN     ?  polyethylene glycol (MIRALAX) packet 17 g   17 g  Oral  DAILY PRN     ?  ondansetron (ZOFRAN ODT) tablet 4 mg   4 mg  Oral  Q8H PRN          Or           ?  ondansetron (ZOFRAN) injection 4 mg   4 mg  IntraVENous  Q6H PRN     ?  insulin lispro (HUMALOG) injection      SubCUTAneous  AC&HS     ?  glucose chewable tablet 16 g   4 Tablet  Oral  PRN     ?  glucagon (GLUCAGEN) injection 1 mg   1 mg  IntraMUSCular  PRN           ?  dextrose 10% infusion 0-250 mL   0-250 mL  IntraVENous  PRN            Labs:     Recent Labs           12/02/20   0119     WBC  7.6     HGB  11.7     HCT  37.3        PLT  90*             Recent Labs           12/02/20   0119     NA  139     K  3.7     CL  98     CO2  38*     GLU  130*     BUN  18     CREA  1.47*     CA  8.9        MG  2.0                 Radiology:   No results found.            The chart, labs, imaging studies, medications, and consultants notes was reviewed by me on: December 02, 2020                  Assessment/Plan:         Acute respiratory failure with hypoxia due to acute HFpEF with exertional dyspnea, fluid overload/anasarca, weight gain, and elevated proBNP:    Improving  Continue diuresis change Bumex to PO  Monitor daily weights and I's and O's   CT of the chest: Trace pleural effusion, no PE.     Continue supplemental oxygen to keep SaO2 greater than 90%.    Echocardiogram EF 65-70%   Will need a 2-day Lexiscan but patient unable to tolerate at this time.   Cardiology consulted        Aortic valve replaced / Pacemaker:   Cardiology consulted.        Abdominal pain /possible colitis:   CT scan of the abdomen shows colitis.     Continue bisacodyl   Continue Roxicodone for mod pain and morphine IV for severe pain          DM type 2 causing renal disease:   A1c 6.1.  Well-controlled   Continue ISS      CKD stage III:   Avoid nephrotoxic drugs.  Watch Cr as pt was given contrast for CT scan.  Check urine lytes and consult nephrology. Notes reviewed and appreciated ok to continue with diuresis       Liver cirrhosis / Elevated bilirubin    Likely d/t morbid obesity CT of the abdomen shows: Hepatic steatosis and cirrhotic change.     Avoid hepatotoxic drugs.  Holding statin for now.       Thrombocytopenia:     Likely secondary to  cirrhosis       Hypothyroidism:   Continue Synthroid. Check TSH        Hyperlipidemia:   On statin but will hold for now       Morbid obesity / OSA:    Uses CPAP.       Chronic pain:   Continue pain medicine.        F: No fluids   E: Monitor especially on diuretic   N: ADULT DIET Regular; Low Fat/Low Chol/High Fiber/NAS; GI Bland (GERD/Peptic Ulcer)          Risk of deterioration: high        Discussed:  Pt's condition, Imaging findings, Lab findings, Assessment, Care Plan, and D/C Planning discussed with: Patient, Family at bedsude, RN, and Care Manager      Prophylaxis:  Hep SQ      Anticipated discharge disposition:  Home with family      Discharge barriers: HR DC Barriers: Currently not medically stable for discharge         Total time: 30 minutes **I personally saw and examined the patient during this time period**         Date of service:    12/02/2020                    ___________________________________________________      Admitting Physician: Carrolyn Meiers, MD

## 2020-12-03 LAB — CBC W/O DIFF
ABSOLUTE NRBC: 0 10*3/uL (ref 0.00–0.01)
HCT: 36.2 % (ref 35.0–47.0)
HGB: 11.7 g/dL (ref 11.5–16.0)
MCH: 30.5 PG (ref 26.0–34.0)
MCHC: 32.3 g/dL (ref 30.0–36.5)
MCV: 94.3 FL (ref 80.0–99.0)
MPV: 10.2 FL (ref 8.9–12.9)
NRBC: 0 PER 100 WBC
PLATELET: 91 10*3/uL — ABNORMAL LOW (ref 150–400)
RBC: 3.84 M/uL (ref 3.80–5.20)
RDW: 13.5 % (ref 11.5–14.5)
WBC: 9.3 10*3/uL (ref 3.6–11.0)

## 2020-12-03 LAB — METABOLIC PANEL, BASIC
Anion gap: 5 mmol/L (ref 5–15)
BUN/Creatinine ratio: 13 (ref 12–20)
BUN: 18 MG/DL (ref 6–20)
CO2: 39 mmol/L — ABNORMAL HIGH (ref 21–32)
Calcium: 9.5 MG/DL (ref 8.5–10.1)
Chloride: 94 mmol/L — ABNORMAL LOW (ref 97–108)
Creatinine: 1.4 MG/DL — ABNORMAL HIGH (ref 0.55–1.02)
Glucose: 124 mg/dL — ABNORMAL HIGH (ref 65–100)
Potassium: 3.4 mmol/L — ABNORMAL LOW (ref 3.5–5.1)
Sodium: 138 mmol/L (ref 136–145)
eGFR: 42 mL/min/{1.73_m2} — ABNORMAL LOW (ref >60)

## 2020-12-03 LAB — GLUCOSE, POC
Glucose (POC): 203 mg/dL — ABNORMAL HIGH (ref 65–117)
Glucose (POC): 141 mg/dL — ABNORMAL HIGH (ref 65–117)
Glucose (POC): 185 mg/dL — ABNORMAL HIGH (ref 65–117)

## 2020-12-03 LAB — CULTURE, BLOOD
Culture result:: NO GROWTH
Culture result:: NO GROWTH

## 2020-12-03 LAB — CULTURE, BLOOD 1
Culture: NO GROWTH
Culture: NO GROWTH

## 2020-12-03 LAB — BASIC METABOLIC PANEL
Anion Gap: 5 mmol/L (ref 5–15)
BUN: 18 MG/DL (ref 6–20)
Bun/Cre Ratio: 13 (ref 12–20)
CO2: 39 mmol/L — ABNORMAL HIGH (ref 21–32)
Calcium: 9.5 MG/DL (ref 8.5–10.1)
Chloride: 94 mmol/L — ABNORMAL LOW (ref 97–108)
Creatinine: 1.4 MG/DL — ABNORMAL HIGH (ref 0.55–1.02)
ESTIMATED GLOMERULAR FILTRATION RATE: 42 mL/min/{1.73_m2} — ABNORMAL LOW (ref >60)
Glucose: 124 mg/dL — ABNORMAL HIGH (ref 65–100)
Potassium: 3.4 mmol/L — ABNORMAL LOW (ref 3.5–5.1)
Sodium: 138 mmol/L (ref 136–145)

## 2020-12-03 LAB — POCT GLUCOSE
POC Glucose: 203 mg/dL — ABNORMAL HIGH (ref 65–117)
POC Glucose: 141 mg/dL — ABNORMAL HIGH (ref 65–117)
POC Glucose: 185 mg/dL — ABNORMAL HIGH (ref 65–117)

## 2020-12-03 LAB — CBC
Hematocrit: 36.2 % (ref 35.0–47.0)
Hemoglobin: 11.7 g/dL (ref 11.5–16.0)
MCH: 30.5 PG (ref 26.0–34.0)
MCHC: 32.3 g/dL (ref 30.0–36.5)
MCV: 94.3 FL (ref 80.0–99.0)
MPV: 10.2 FL (ref 8.9–12.9)
NRBC Absolute: 0 10*3/uL (ref 0.00–0.01)
Nucleated RBCs: 0 PER 100 WBC
Platelets: 91 10*3/uL — ABNORMAL LOW (ref 150–400)
RBC: 3.84 M/uL (ref 3.80–5.20)
RDW: 13.5 % (ref 11.5–14.5)
WBC: 9.3 10*3/uL (ref 3.6–11.0)

## 2020-12-03 MED ORDER — ASPIRIN 81 MG CHEWABLE TAB
81 mg | ORAL_TABLET | Freq: Every day | ORAL | 0 refills | Status: AC
Start: 2020-12-03 — End: ?

## 2020-12-03 MED ORDER — BUMETANIDE 2 MG TAB
2 mg | ORAL_TABLET | Freq: Two times a day (BID) | ORAL | 0 refills | Status: AC
Start: 2020-12-03 — End: 2020-12-18

## 2020-12-03 MED ORDER — POTASSIUM CHLORIDE ER 20 MEQ TABLET,EXTENDED RELEASE
20 mEq | ORAL_TABLET | Freq: Every day | ORAL | 0 refills | Status: AC
Start: 2020-12-03 — End: 2020-12-08

## 2020-12-03 MED ADMIN — insulin lispro (HUMALOG) injection: SUBCUTANEOUS | @ 03:00:00 | NDC 00002751017

## 2020-12-03 MED ADMIN — montelukast (SINGULAIR) tablet 10 mg: ORAL | @ 03:00:00 | NDC 00904680861

## 2020-12-03 MED ADMIN — gabapentin (NEURONTIN) capsule 300 mg: ORAL | @ 03:00:00 | NDC 00904666661

## 2020-12-03 MED ADMIN — insulin lispro (HUMALOG) injection: SUBCUTANEOUS | @ 15:00:00 | NDC 00002751017

## 2020-12-03 MED ADMIN — potassium chloride SR (KLOR-CON 10) tablet 40 mEq: ORAL | @ 15:00:00 | NDC 00904721661

## 2020-12-03 MED ADMIN — insulin lispro (HUMALOG) injection: SUBCUTANEOUS | @ 18:00:00 | NDC 00002751017

## 2020-12-03 MED ADMIN — bumetanide (BUMEX) tablet 2 mg: ORAL | @ 15:00:00 | NDC 69238149001

## 2020-12-03 MED ADMIN — sodium chloride (NS) flush 5-40 mL: INTRAVENOUS | @ 10:00:00 | NDC 87701099893

## 2020-12-03 MED ADMIN — gabapentin (NEURONTIN) capsule 300 mg: ORAL | @ 15:00:00 | NDC 00904666661

## 2020-12-03 MED ADMIN — magnesium oxide (MAG-OX) tablet 400 mg: ORAL | @ 15:00:00 | NDC 96295013573

## 2020-12-03 MED ADMIN — sodium chloride (NS) flush 5-40 mL: INTRAVENOUS | @ 03:00:00 | NDC 87701099893

## 2020-12-03 MED FILL — INSULIN LISPRO 100 UNIT/ML INJECTION: 100 unit/mL | SUBCUTANEOUS | Qty: 1

## 2020-12-03 MED FILL — TRAZODONE 100 MG TAB: 100 mg | ORAL | Qty: 1

## 2020-12-03 MED FILL — SUCRALFATE 1 GRAM TAB: 1 gram | ORAL | Qty: 1

## 2020-12-03 MED FILL — METOPROLOL SUCCINATE SR 50 MG 24 HR TAB: 50 mg | ORAL | Qty: 1

## 2020-12-03 MED FILL — IPRATROPIUM-ALBUTEROL 2.5 MG-0.5 MG/3 ML NEB SOLUTION: 2.5 mg-0.5 mg/3 ml | RESPIRATORY_TRACT | Qty: 3

## 2020-12-03 MED FILL — OXYCODONE 5 MG TAB: 5 mg | ORAL | Qty: 1

## 2020-12-03 MED FILL — LEVOTHYROXINE 25 MCG TAB: 25 mcg | ORAL | Qty: 1

## 2020-12-03 MED FILL — GABAPENTIN 300 MG CAP: 300 mg | ORAL | Qty: 1

## 2020-12-03 MED FILL — MAGNESIUM OXIDE 400 MG TAB: 400 mg | ORAL | Qty: 1

## 2020-12-03 MED FILL — CITALOPRAM 20 MG TAB: 20 mg | ORAL | Qty: 2

## 2020-12-03 MED FILL — POTASSIUM CHLORIDE SR 10 MEQ TAB: 10 mEq | ORAL | Qty: 4

## 2020-12-03 MED FILL — AMIODARONE 200 MG TAB: 200 mg | ORAL | Qty: 2

## 2020-12-03 MED FILL — BUMETANIDE 1 MG TAB: 1 mg | ORAL | Qty: 2

## 2020-12-03 MED FILL — ASPIRIN 81 MG CHEWABLE TAB: 81 mg | ORAL | Qty: 1

## 2020-12-03 MED FILL — MONTELUKAST 10 MG TAB: 10 mg | ORAL | Qty: 1

## 2020-12-03 NOTE — Progress Notes (Signed)
Reviewed discharge instructions with patient. Patient verbalized understanding. Patients son to drive patient home.

## 2020-12-03 NOTE — Progress Notes (Signed)
TOC:   1) Home with family and follow up appointments   2) Family will drive pt home at time of discharge and as needed   3) Risk of readmission- 12% LOW    Hospital Day 5:   D/C Order Noted:   9:48 AM- Pt transferred to Ortho- now stable for discharge. Pt to go home with family and follow up appointments. CM spoke with pt and pt's son- son will be here at 2:00 PM (attending wanted to monitor this morning after removing rocket from nose).    Medicare pt has received, reviewed, and signed 2nd IM letter informing them of their right to appeal the discharge.  Signed copy has been placed on pt bedside chart.     Pt and pt's son state no questions or concerns- RN informed no further CM needs identified.     Care Management Interventions  PCP Verified by CM: Yes  Mode of Transport at Discharge: Self  Transition of Care Consult (CM Consult): Discharge Planning  MyChart Signup: No  Discharge Durable Medical Equipment: No  Health Maintenance Reviewed: Yes  Physical Therapy Consult: Yes  Occupational Therapy Consult: Yes  Support Systems: Child(ren)  Confirm Follow Up Transport: Family  The Plan for Transition of Care is Related to the Following Treatment Goals : CHF  The Patient and/or Patient Representative was Provided with a Choice of Provider and Agrees with the Discharge Plan?: Yes  Name of the Patient Representative Who was Provided with a Choice of Provider and Agrees with the Discharge Plan: Spoke with pt's son  Freedom of Choice List was Provided with Basic Dialogue that Supports the Patient's Individualized Plan of Care/Goals, Treatment Preferences and Shares the Quality Data Associated with the Providers?: Yes  Discharge Location  Patient Expects to be Discharged to:: Home with family assistance     Hyman Bower, MSW, CM   [facility]

## 2020-12-04 NOTE — Telephone Encounter (Signed)
Telephone call made to patient for purpose of care transition.  Message left with call back number.

## 2020-12-06 NOTE — Anesthesia Post-Procedure Evaluation (Signed)
Procedure(s):  COLONOSCOPY  COLON BIOPSY.    MAC    Anesthesia Post Evaluation        Patient location during evaluation: PACU  Level of consciousness: awake  Pain management: adequate  Airway patency: patent  Anesthetic complications: no  Cardiovascular status: acceptable  Respiratory status: acceptable  Hydration status: acceptable  Comments: Patient experienced epistaxis from right nares after nasal trumpet insertion. Despite prolonged external compression, bleeding persisted. Finally tamponaded with 4.5 cm rhino rocket placement. I have discussed with my colleagues on call to follow up regarding removal of this prior to patient discharge.  Post anesthesia nausea and vomiting:  none      INITIAL Post-op Vital signs:   Vitals Value Taken Time   BP 115/73 12/03/20 1213   Temp 37 ??C (98.6 ??F) 12/03/20 1213   Pulse 45 12/03/20 1213   Resp 18 12/03/20 1213   SpO2 96 % 12/03/20 1213

## 2020-12-14 ENCOUNTER — Encounter: Primary: Family

## 2020-12-27 ENCOUNTER — Encounter: Attending: Internal Medicine | Primary: Family Medicine

## 2020-12-27 NOTE — Telephone Encounter (Addendum)
Left voicemail on patient's home phone number regarding rescheduling 2 day nuclear stress test. Gave patient office number and instructed her to call us back to get test scheduled.       Addendum  No ischemia  Ventricular bigeminy  Unifocal PVC, RBBB and superior axis PVCs  Cannot get LVEF due to frequent PVC    Future Appointments   Date Time Provider Department Center   03/09/2021  3:20 PM PACEMAKER, [facility] AMB   03/09/2021  3:40 PM Bui, An H, MD [facility] AMB

## 2021-01-04 ENCOUNTER — Encounter: Primary: Family

## 2021-01-24 ENCOUNTER — Inpatient Hospital Stay
Admit: 2021-01-24 | Discharge: 2021-01-26 | Disposition: A | Discharge status: Home / Self Care | Payer: MEDICARE | Attending: Internal Medicine | Admitting: Internal Medicine

## 2021-01-24 ENCOUNTER — Emergency Department: Admit: 2021-01-24 | Payer: MEDICARE | Primary: Family Medicine

## 2021-01-24 DIAGNOSIS — I130 Hypertensive heart and chronic kidney disease with heart failure and stage 1 through stage 4 chronic kidney disease, or unspecified chronic kidney disease: Secondary | ICD-10-CM

## 2021-01-24 DIAGNOSIS — J9601 Acute respiratory failure with hypoxia: Secondary | ICD-10-CM

## 2021-01-24 LAB — CBC WITH AUTOMATED DIFF
ABS. BASOPHILS: 0 10*3/uL (ref 0.0–0.1)
ABS. EOSINOPHILS: 0.1 10*3/uL (ref 0.0–0.4)
ABS. IMM. GRANS.: 0 10*3/uL (ref 0.00–0.04)
ABS. LYMPHOCYTES: 1.8 10*3/uL (ref 0.8–3.5)
ABS. MONOCYTES: 0.2 10*3/uL (ref 0.0–1.0)
ABS. NEUTROPHILS: 2.4 10*3/uL (ref 1.8–8.0)
ABSOLUTE NRBC: 0 10*3/uL (ref 0.00–0.01)
BASOPHILS: 0 % (ref 0–1)
EOSINOPHILS: 3 % (ref 0–7)
HCT: 36.6 % (ref 35.0–47.0)
HGB: 11.3 g/dL — ABNORMAL LOW (ref 11.5–16.0)
IMMATURE GRANULOCYTES: 0 % (ref 0.0–0.5)
LYMPHOCYTES: 41 % (ref 12–49)
MCH: 30.1 PG (ref 26.0–34.0)
MCHC: 30.9 g/dL (ref 30.0–36.5)
MCV: 97.6 FL (ref 80.0–99.0)
MONOCYTES: 5 % (ref 5–13)
MPV: 10.4 FL (ref 8.9–12.9)
NEUTROPHILS: 51 % (ref 32–75)
NRBC: 0 PER 100 WBC
PLATELET: 81 10*3/uL — ABNORMAL LOW (ref 150–400)
RBC: 3.75 M/uL — ABNORMAL LOW (ref 3.80–5.20)
RDW: 14.2 % (ref 11.5–14.5)
WBC: 4.5 10*3/uL (ref 3.6–11.0)

## 2021-01-24 LAB — METABOLIC PANEL, COMPREHENSIVE
A-G Ratio: 0.9 — ABNORMAL LOW (ref 1.1–2.2)
ALT (SGPT): 14 U/L (ref 12–78)
AST (SGOT): 12 U/L — ABNORMAL LOW (ref 15–37)
Albumin: 3.4 g/dL — ABNORMAL LOW (ref 3.5–5.0)
Alk. phosphatase: 56 U/L (ref 45–117)
Anion gap: 3 mmol/L — ABNORMAL LOW (ref 5–15)
BUN/Creatinine ratio: 9 — ABNORMAL LOW (ref 12–20)
BUN: 13 MG/DL (ref 6–20)
Bilirubin, total: 2.1 MG/DL — ABNORMAL HIGH (ref 0.2–1.0)
CO2: 34 mmol/L — ABNORMAL HIGH (ref 21–32)
Calcium: 8.5 MG/DL (ref 8.5–10.1)
Chloride: 105 mmol/L (ref 97–108)
Creatinine: 1.39 MG/DL — ABNORMAL HIGH (ref 0.55–1.02)
Globulin: 3.6 g/dL (ref 2.0–4.0)
Glucose: 99 mg/dL (ref 65–100)
Potassium: 3.8 mmol/L (ref 3.5–5.1)
Protein, total: 7 g/dL (ref 6.4–8.2)
Sodium: 142 mmol/L (ref 136–145)
eGFR: 42 mL/min/{1.73_m2} — ABNORMAL LOW (ref >60)

## 2021-01-24 LAB — GLUCOSE, POC: Glucose (POC): 91 mg/dL (ref 65–117)

## 2021-01-24 LAB — SAMPLES BEING HELD

## 2021-01-24 LAB — NT-PRO BNP: NT pro-BNP: 1737 PG/ML — ABNORMAL HIGH (ref <125)

## 2021-01-24 LAB — COVID-19 RAPID TEST: COVID-19 rapid test: NOT DETECTED

## 2021-01-24 LAB — PROTHROMBIN TIME + INR
INR: 1.1 (ref 0.9–1.1)
Prothrombin time: 11.8 s — ABNORMAL HIGH (ref 9.0–11.1)

## 2021-01-24 LAB — INFLUENZA A+B VIRAL AGS
Flu A Antigen: NEGATIVE
Influenza A Antigen: NEGATIVE
Influenza B Antigen: NEGATIVE
Influenza B Antigen: NEGATIVE

## 2021-01-24 LAB — TROPONIN-HIGH SENSITIVITY: Troponin-High Sensitivity: 11 ng/L (ref 0–51)

## 2021-01-24 LAB — MAGNESIUM
Magnesium: 1.5 mg/dL — ABNORMAL LOW (ref 1.6–2.4)
Magnesium: 1.5 mg/dL — ABNORMAL LOW (ref 1.6–2.4)

## 2021-01-24 LAB — CBC WITH AUTO DIFFERENTIAL
Basophils %: 0 % (ref 0–1)
Basophils Absolute: 0 10*3/uL (ref 0.0–0.1)
Eosinophils %: 3 % (ref 0–7)
Eosinophils Absolute: 0.1 10*3/uL (ref 0.0–0.4)
Granulocyte Absolute Count: 0 10*3/uL (ref 0.00–0.04)
Hematocrit: 36.6 % (ref 35.0–47.0)
Hemoglobin: 11.3 g/dL — ABNORMAL LOW (ref 11.5–16.0)
Immature Granulocytes: 0 % (ref 0.0–0.5)
Lymphocytes %: 41 % (ref 12–49)
Lymphocytes Absolute: 1.8 10*3/uL (ref 0.8–3.5)
MCH: 30.1 PG (ref 26.0–34.0)
MCHC: 30.9 g/dL (ref 30.0–36.5)
MCV: 97.6 FL (ref 80.0–99.0)
MPV: 10.4 FL (ref 8.9–12.9)
Monocytes %: 5 % (ref 5–13)
Monocytes Absolute: 0.2 10*3/uL (ref 0.0–1.0)
NRBC Absolute: 0 10*3/uL (ref 0.00–0.01)
Neutrophils %: 51 % (ref 32–75)
Neutrophils Absolute: 2.4 10*3/uL (ref 1.8–8.0)
Nucleated RBCs: 0 PER 100 WBC
Platelets: 81 10*3/uL — ABNORMAL LOW (ref 150–400)
RBC: 3.75 M/uL — ABNORMAL LOW (ref 3.80–5.20)
RDW: 14.2 % (ref 11.5–14.5)
WBC: 4.5 10*3/uL (ref 3.6–11.0)

## 2021-01-24 LAB — COMPREHENSIVE METABOLIC PANEL
ALT: 14 U/L (ref 12–78)
AST: 12 U/L — ABNORMAL LOW (ref 15–37)
Albumin/Globulin Ratio: 0.9 — ABNORMAL LOW (ref 1.1–2.2)
Albumin: 3.4 g/dL — ABNORMAL LOW (ref 3.5–5.0)
Alkaline Phosphatase: 56 U/L (ref 45–117)
Anion Gap: 3 mmol/L — ABNORMAL LOW (ref 5–15)
BUN: 13 MG/DL (ref 6–20)
Bun/Cre Ratio: 9 — ABNORMAL LOW (ref 12–20)
CO2: 34 mmol/L — ABNORMAL HIGH (ref 21–32)
Calcium: 8.5 MG/DL (ref 8.5–10.1)
Chloride: 105 mmol/L (ref 97–108)
Creatinine: 1.39 MG/DL — ABNORMAL HIGH (ref 0.55–1.02)
ESTIMATED GLOMERULAR FILTRATION RATE: 42 mL/min/{1.73_m2} — ABNORMAL LOW (ref >60)
Globulin: 3.6 g/dL (ref 2.0–4.0)
Glucose: 99 mg/dL (ref 65–100)
Potassium: 3.8 mmol/L (ref 3.5–5.1)
Sodium: 142 mmol/L (ref 136–145)
Total Bilirubin: 2.1 MG/DL — ABNORMAL HIGH (ref 0.2–1.0)
Total Protein: 7 g/dL (ref 6.4–8.2)

## 2021-01-24 LAB — POCT GLUCOSE: POC Glucose: 91 mg/dL (ref 65–117)

## 2021-01-24 LAB — COVID-19, RAPID: SARS-CoV-2, Rapid: NOT DETECTED

## 2021-01-24 LAB — PROTIME-INR
INR: 1.1 (ref 0.9–1.1)
Protime: 11.8 s — ABNORMAL HIGH (ref 9.0–11.1)

## 2021-01-24 LAB — PROBNP, N-TERMINAL: BNP: 1737 PG/ML — ABNORMAL HIGH (ref <125)

## 2021-01-24 LAB — TROPONIN, HIGH SENSITIVITY: Troponin, High Sensitivity: 11 ng/L (ref 0–51)

## 2021-01-24 MED ORDER — IPRATROPIUM-ALBUTEROL 2.5 MG-0.5 MG/3 ML NEB SOLUTION
2.5 mg-0.5 mg/3 ml | RESPIRATORY_TRACT | Status: AC
Start: 2021-01-24 — End: 2021-01-24
  Administered 2021-01-24: 19:00:00 via RESPIRATORY_TRACT

## 2021-01-24 MED ORDER — GLUCOSE 4 GRAM CHEWABLE TAB
4 gram | ORAL | Status: AC | PRN
Start: 2021-01-24 — End: 2021-01-26

## 2021-01-24 MED ORDER — MAGNESIUM SULFATE 2 GRAM/50 ML IVPB
2 gram/50 mL (4 %) | Freq: Once | INTRAVENOUS | Status: AC
Start: 2021-01-24 — End: 2021-01-24
  Administered 2021-01-24: 22:00:00 via INTRAVENOUS

## 2021-01-24 MED ORDER — IOPAMIDOL 76 % IV SOLN
370 mg iodine /mL (76 %) | Freq: Once | INTRAVENOUS | Status: AC
Start: 2021-01-24 — End: 2021-01-24
  Administered 2021-01-24: 22:00:00 via INTRAVENOUS

## 2021-01-24 MED ORDER — IPRATROPIUM-ALBUTEROL 2.5 MG-0.5 MG/3 ML NEB SOLUTION
2.5 mg-0.5 mg/3 ml | RESPIRATORY_TRACT | Status: AC | PRN
Start: 2021-01-24 — End: 2021-01-26

## 2021-01-24 MED ORDER — OXYCODONE 5 MG TAB
5 mg | ORAL | Status: DC | PRN
Start: 2021-01-24 — End: 2021-01-26
  Administered 2021-01-25 – 2021-01-26 (×4): via ORAL

## 2021-01-24 MED ORDER — BUMETANIDE 0.25 MG/ML IJ SOLN
0.25 mg/mL | Freq: Two times a day (BID) | INTRAMUSCULAR | Status: AC
Start: 2021-01-24 — End: 2021-01-26
  Administered 2021-01-25 – 2021-01-26 (×4): via INTRAVENOUS

## 2021-01-24 MED ORDER — GLUCAGON 1 MG INJECTION
1 mg | INTRAMUSCULAR | Status: AC | PRN
Start: 2021-01-24 — End: 2021-01-26

## 2021-01-24 MED ORDER — HYDROMORPHONE 1 MG/ML INJECTION SOLUTION
1 mg/mL | INTRAMUSCULAR | Status: AC | PRN
Start: 2021-01-24 — End: 2021-01-26

## 2021-01-24 MED ORDER — INSULIN LISPRO 100 UNIT/ML INJECTION
100 unit/mL | Freq: Four times a day (QID) | SUBCUTANEOUS | Status: AC
Start: 2021-01-24 — End: 2021-01-26
  Administered 2021-01-26 (×2): via SUBCUTANEOUS

## 2021-01-24 MED ORDER — BUMETANIDE 0.25 MG/ML IJ SOLN
0.25 mg/mL | INTRAMUSCULAR | Status: AC
Start: 2021-01-24 — End: 2021-01-24
  Administered 2021-01-24: 22:00:00 via INTRAVENOUS

## 2021-01-24 MED FILL — BUMETANIDE 0.25 MG/ML IJ SOLN: 0.25 mg/mL | INTRAMUSCULAR | Qty: 4

## 2021-01-24 MED FILL — MAGNESIUM SULFATE 2 GRAM/50 ML IVPB: 2 gram/50 mL (4 %) | INTRAVENOUS | Qty: 50

## 2021-01-24 MED FILL — ISOVUE-370  76 % INTRAVENOUS SOLUTION: 370 mg iodine /mL (76 %) | INTRAVENOUS | Qty: 100

## 2021-01-24 NOTE — ED Notes (Signed)
Patient in disaster hallway. Currently no cardiac/respiratory monitor available to place patient on

## 2021-01-24 NOTE — H&P (Signed)
[facility]  [address] [place], [place] [address]  [phone]    Hospitalist Admission History and Physical      NAME:  Deborah [name]   DOB:   07-13-1955   MRN:  [id number]     PCP:  Sol Blazing, MD     Date/Time of service:  01/24/2021  5:30 PM        Subjective:     CHIEF COMPLAINT: dyspnea      HISTORY OF PRESENT ILLNESS:     The patient is a 65 yo hx of HTN, DM, dCHF, PVCs s/p pacer, bioprosthetic AVR, presented w/ dyspnea, CHF.  The patient c/o worsening dyspnea on exertion for several days, associated with worsening LE edema, orthopnea.  She denied chest pain, cough, fevers, chills, nausea, vomiting, diarrhea.  In the ED, O2 sat was 86% on RA.  Pro BNP was 1,737.      Allergies   Allergen Reactions    Nitroglycerin Unknown (comments)     hypotension    Aloe Vera Rash    Hydrochlorothiazide Other (comments)     Reports 'kidneys dry up"     Tetanus And Diphther. Tox (Pf) Swelling     Swelling of arm and it turns black       Prior to Admission medications    Medication Sig Start Date End Date Taking? Authorizing Provider   aspirin 81 mg chewable tablet Take 1 Tablet by mouth daily. 12/03/20   Carrolyn Meiers, MD   lidocaine (Lidoderm) 5 % Apply patch to the affected area for 12 hours a day and remove for 12 hours a day. 11/05/20   Francoise Ceo, Zakkary Thibault   metoprolol succinate (TOPROL-XL) 50 mg XL tablet TAKE 1 TABLET BY MOUTH EVERY DAY 10/27/20   Costella Hatcher B, NP   acetaminophen (TYLENOL) 500 mg tablet Take 1,000 mg by mouth every six (6) hours as needed for Pain.    Provider, Historical   albuterol (PROVENTIL HFA, VENTOLIN HFA, PROAIR HFA) 90 mcg/actuation inhaler Take 2 Puffs by inhalation every six (6) hours as needed.    Provider, Historical   atorvastatin (LIPITOR) 80 mg tablet  01/04/20   Provider, Historical   calcium-cholecalciferol, D3, (CALTRATE 600+D) tablet Take 1 Tablet by mouth nightly.    Provider, Historical   citalopram (CELEXA) 40 mg tablet Take 40 mg by mouth  daily. 02/22/20   Provider, Historical   fluticasone propionate (FLONASE) 50 mcg/actuation nasal spray 2 Sprays by Nasal route daily as needed.    Provider, Historical   levothyroxine (SYNTHROID) 137 mcg tablet TAKE 1 BY MOUTH DAILY, FOLLOW UP IN 90 DAYS FOR RECHECK THYROID LEVEL 02/24/20   Provider, Historical   sucralfate (CARAFATE) 1 gram tablet Take 1 g by mouth three (3) times daily.    Provider, Historical   traZODone (DESYREL) 100 mg tablet Take 100 mg by mouth nightly. 12/31/19   Provider, Historical   Xtampza ER 9 mg capsule TAKE 1 CAPSULE BY MOUTH EVERY 12 HOURS FILL 02/12/20 02/24/20   Provider, Historical   glimepiride (AMARYL) 2 mg tablet Take 2 mg by mouth two (2) times a day.    Provider, Historical   montelukast (SINGULAIR) 10 mg tablet Take 10 mg by mouth nightly.    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    Rhinitis         Past Surgical History:   Procedure Laterality Date    COLONOSCOPY N/A 12/01/2020    COLONOSCOPY performed by Ian Malkin, MD at [facility] ENDOSCOPY    HX PACEMAKER         Social History     Tobacco Use    Smoking status: Former     Packs/day: 1.00     Years: 20.00     Pack years: 20.00     Types: Cigarettes     Quit date: 03/21/2010     Years since quitting: 10.8    Smokeless tobacco: Never   Substance Use Topics    Alcohol use: Not Currently        Family History   Problem Relation Age of Onset    Hypertension Mother     Hypertension Father         Review of Systems:  (bold if positive, if negative)    Gen:  Eyes:  ENT:  CVS:   edemaPulm:  , dyspnea,GI:  GU:  MS:  Skin:  Psych:  Endo:  Hem:  Renal:  Neuro:           Objective:      VITALS:    Vital signs reviewed; most recent are:    Visit Vitals  BP (!) 105/57   Pulse 63   Temp 97.5 ??F (36.4 ??C)   Resp 16   SpO2 100%     SpO2 Readings from Last 6 Encounters:   01/24/21 100%   12/03/20 96%   11/05/20 92%   08/26/20 95%   05/23/20 93%   04/30/20 97%    O2 Flow Rate (L/min): 2 l/min   No intake or output data in the 24 hours ending 01/24/21 1730     Exam:     Physical Exam:    Gen:  Well-developed, well-nourished, morbidly obese, in no acute distress  HEENT:  Pink conjunctivae, PERRL, hearing intact to voice, moist mucous membranes  Neck:  Supple, without masses, thyroid non-tender  Resp:  No accessory muscle use, clear breath sounds without wheezes rales or rhonchi  Card:  No murmurs, normal S1, S2 without thrills, 3+ edema  Abd:  Soft, non-tender, non-distended, normoactive bowel sounds are present  Lymph:  No cervical adenopathy  Musc:  No cyanosis or clubbing  Skin:  No rashes  Neuro:  Cranial nerves 3-12 are grossly intact, follows commands appropriately  Psych:  Alert with good insight.  Oriented to person, place, and time    Labs:    Recent Labs     01/24/21  1346   WBC 4.5   HGB 11.3*   HCT 36.6   PLT 81*     Recent Labs     01/24/21  1346   NA 142   K 3.8   CL 105   CO2 34*   GLU 99   BUN 13   CREA 1.39*   CA 8.5   MG 1.5*   ALB 3.4*   TBILI 2.1*  ALT 14     Lab Results   Component Value Date/Time    Glucose (POC) 185 (H) 12/03/2020 12:34 PM    Glucose (POC) 141 (H) 12/03/2020 08:11 AM     No results for input(s): PH, PCO2, PO2, HCO3, FIO2 in the last 72 hours.  Recent Labs     01/24/21  1346   INR 1.1       Radiology and EKG reviewed:   CXR reviewed    **Old Records reviewed in [facility]**       Assessment/Plan:       Principal Problem:    65 yo hx of HTN, DM, dCHF, PVCs s/p pacer, bioprosthetic AVR, presented w/ dyspnea, CHF    1) Dyspnea/hypoxia/acute on chronic dCHF: last EF 65-70%.  Awaiting chest CT (ordered by ED).  Will monitor on Tele.  Start IV bumex,  monitor I/O.  Consult Cards    2) HTN/PVCs: s/p pacer.  Cont toprol    3) DM type 2 w/ renal complications: check A1C.  Hold amaryl.  Start SSI    4) Hypothyroid: check TSH.  Cont synthroid    5) Chronic pain: cont oxycodone, use IV dilaudid prn severe pain    7) Thrombocytopenia: seems chronic.  Will monitor     Risk of deterioration: high      Total time spent with patient care: 56 Minutes **I personally saw and examined the patient during this time period**                 Care Plan discussed with: Patient, nursing     Discussed:  Care Plan    Prophylaxis:  SCD's (due to thrombocytopenia)    Probable Disposition:  Home w/Family           ___________________________________________________    Attending Physician: Dayna Barker, MD

## 2021-01-24 NOTE — ED Provider Notes (Signed)
Chief Complaint   Patient presents with    Shortness of Breath     This is a 65 year old female with a history of diabetes, chronic kidney disease, hypothyroidism, morbid obesity, status post pacemaker placement, COPD, congestive heart failure, by EMS for several weeks of worsening shortness of breath.  She was hospitalized last month for ischemic colitis and at the same time was told that she had congestive heart failure with preserved ejection fraction.  She was discharged with a prescription for Bumex which she took for 2 weeks until her prescription ran out, she did not have anyone to follow-up with purportedly and so she know who she was to call to obtain a refill.  And so she has not been taking the Bumex but has been taking Lasix she had left over recently instead (none yet today).  She uses albuterol at home for COPD, is non oxygen dependent, does not use any tobacco products.  Has had a cough and chest tightness but no fevers, chest pain, abdominal pain, or vomiting specifically.  Some chronic lower extremity edema not acutely worse today.  Denies any history of venous thromboembolism or calf pain.  No other systemic complaints, symptoms are moderate in nature without alleviating factors.      Review of Systems   Constitutional:  Negative for fever.   HENT:  Negative for facial swelling.    Eyes:  Negative for redness.   Respiratory:  Positive for cough and shortness of breath.    Cardiovascular:  Negative for chest pain.   Gastrointestinal:  Negative for abdominal pain and vomiting.   Genitourinary:  Negative for difficulty urinating.   Musculoskeletal:  Negative for myalgias.   Neurological:  Negative for speech difficulty.   Psychiatric/Behavioral:  Negative for confusion.        Past Medical History:   Diagnosis Date    Anxiety and depression     Aortic valve replaced     Asthma     Chronic narcotic use     Chronic pain     CKD (chronic kidney disease), stage III (HCC)     DM type 2 causing renal  disease (HCC)     GERD (gastroesophageal reflux disease)     Hyperlipidemia     Hypothyroid     Morbid obesity ([place])     Neuropathy     Pacemaker     [employer].    Rhinitis        Past Surgical History:   Procedure Laterality Date    COLONOSCOPY N/A 12/01/2020    COLONOSCOPY performed by Rachael Darby, MD at [facility] ENDOSCOPY    HX PACEMAKER           Family History:   Problem Relation Age of Onset    Hypertension Mother     Hypertension Father        Social History     Socioeconomic History    Marital status: SINGLE     Spouse name: Not on file    Number of children: Not on file    Years of education: Not on file    Highest education level: Not on file   Occupational History    Not on file   Tobacco Use    Smoking status: Former     Packs/day: 1.00     Years: 20.00     Pack years: 20.00     Types: Cigarettes     Quit date: 03/21/2010     Years since quitting:  10.8    Smokeless tobacco: Never   Vaping Use    Vaping Use: Never used   Substance and Sexual Activity    Alcohol use: Not Currently    Drug use: Never    Sexual activity: Not on file   Other Topics Concern    Not on file   Social History Narrative    Not on file     Social Determinants of Health     Financial Resource Strain: Not on file   Food Insecurity: Not on file   Transportation Needs: Not on file   Physical Activity: Not on file   Stress: Not on file   Social Connections: Not on file   Intimate Partner Violence: Not on file   Housing Stability: Not on file         ALLERGIES: Nitroglycerin, Aloe vera, Hydrochlorothiazide, and Tetanus and diphther. tox (pf)      Vitals:    01/24/21 1315   BP: (!) 135/59   Pulse: (!) 59   Resp: 18   Temp: 97.9 ??F (36.6 ??C)   SpO2: 97%       Physical exam  General:  Awake and alert, NAD  HEENT:  NC/AT, equal pupils, moist mucous membranes  Neck:   Normal inspection, full range of motion  Cardiac:  RRR, no murmurs  Respiratory:  Clear bilaterally, no wheezes, rales, rhonchi  Abdomen:  Soft and nontender,  nondistended  Extremities: Warm and well perfused, no peripheral edema  Neuro:  Moving all extremities symmetrically without gross motor deficit  Skin:   No rashes, ecchymoses, or pallor      Recent Results (from the past 12 hour(s))   SAMPLES BEING HELD    Collection Time: 01/24/21  1:46 PM   Result Value Ref Range    SAMPLES BEING HELD 1SST,1RED     COMMENT        Add-on orders for these samples will be processed based on acceptable specimen integrity and analyte stability, which may vary by analyte.   CBC WITH AUTOMATED DIFF    Collection Time: 01/24/21  1:46 PM   Result Value Ref Range    WBC 4.5 3.6 - 11.0 K/uL    RBC 3.75 (L) 3.80 - 5.20 M/uL    HGB 11.3 (L) 11.5 - 16.0 g/dL    HCT 36.6 35.0 - 47.0 %    MCV 97.6 80.0 - 99.0 FL    MCH 30.1 26.0 - 34.0 PG    MCHC 30.9 30.0 - 36.5 g/dL    RDW 14.2 11.5 - 14.5 %    PLATELET 81 (L) 150 - 400 K/uL    MPV 10.4 8.9 - 12.9 FL    NRBC 0.0 0 PER 100 WBC    ABSOLUTE NRBC 0.00 0.00 - 0.01 K/uL    NEUTROPHILS 51 32 - 75 %    LYMPHOCYTES 41 12 - 49 %    MONOCYTES 5 5 - 13 %    EOSINOPHILS 3 0 - 7 %    BASOPHILS 0 0 - 1 %    IMMATURE GRANULOCYTES 0 0.0 - 0.5 %    ABS. NEUTROPHILS 2.4 1.8 - 8.0 K/UL    ABS. LYMPHOCYTES 1.8 0.8 - 3.5 K/UL    ABS. MONOCYTES 0.2 0.0 - 1.0 K/UL    ABS. EOSINOPHILS 0.1 0.0 - 0.4 K/UL    ABS. BASOPHILS 0.0 0.0 - 0.1 K/UL    ABS. IMM. GRANS. 0.0 0.00 - 0.04 K/UL    DF  SMEAR SCANNED      RBC COMMENTS ANISOCYTOSIS  1+       METABOLIC PANEL, COMPREHENSIVE    Collection Time: 01/24/21  1:46 PM   Result Value Ref Range    Sodium 142 136 - 145 mmol/L    Potassium 3.8 3.5 - 5.1 mmol/L    Chloride 105 97 - 108 mmol/L    CO2 34 (H) 21 - 32 mmol/L    Anion gap 3 (L) 5 - 15 mmol/L    Glucose 99 65 - 100 mg/dL    BUN 13 6 - 20 MG/DL    Creatinine 1.39 (H) 0.55 - 1.02 MG/DL    BUN/Creatinine ratio 9 (L) 12 - 20      eGFR 42 (L) >60 ml/min/1.46m    Calcium 8.5 8.5 - 10.1 MG/DL    Bilirubin, total 2.1 (H) 0.2 - 1.0 MG/DL    ALT (SGPT) 14 12 - 78 U/L    AST (SGOT)  12 (L) 15 - 37 U/L    Alk. phosphatase 56 45 - 117 U/L    Protein, total 7.0 6.4 - 8.2 g/dL    Albumin 3.4 (L) 3.5 - 5.0 g/dL    Globulin 3.6 2.0 - 4.0 g/dL    A-G Ratio 0.9 (L) 1.1 - 2.2     TROPONIN-HIGH SENSITIVITY    Collection Time: 01/24/21  1:46 PM   Result Value Ref Range    Troponin-High Sensitivity 11 0 - 51 ng/L   MAGNESIUM    Collection Time: 01/24/21  1:46 PM   Result Value Ref Range    Magnesium 1.5 (L) 1.6 - 2.4 mg/dL   NT-PRO BNP    Collection Time: 01/24/21  1:46 PM   Result Value Ref Range    NT pro-BNP 1,737 (H) <125 PG/ML   PROTHROMBIN TIME + INR    Collection Time: 01/24/21  1:46 PM   Result Value Ref Range    INR 1.1 0.9 - 1.1      Prothrombin time 11.8 (H) 9.0 - 11.1 sec      XR CHEST PA LAT    Result Date: 01/24/2021  No acute cardiopulmonary disease.     Procedures - none unless documented below    EKG as interpreted by me: Paced rhythm at a rate of 60, left axis deviation, normal QRS interval.    ED course: Labs, EKG and imaging reviewed.  Hypoxic to 85% on room air on arrival requiring 2 liters NC.  She feels like she is in fluid overload and ran out of her Bumex recently.  BNP elevated but improved from last visit, chest film is clear.  Also has a history of COPD well maintained, being treated with Duoneb.  I've ordered IV Bumex.  Will admit for continued management, discussed with the hospitalist.      Hospitalist Perfect Serve for Admission  3:18 PM    ED Room Number:   D33/D33  Patient Name and age:  Deborah Chambers65y.o.  female  Working Diagnosis:     1. Acute respiratory failure with hypoxia ([facility]    2. Acute on chronic congestive heart failure, unspecified heart failure type ([facility]    3. Hypomagnesemia    4. Chronic obstructive pulmonary disease, unspecified COPD type ([facility]      COVID suspicion:   Other(pending)  Code Status:    Full Code  Readmission:    yes  Isolation Requirements:  Other  Recommended Level of Care: telemetry  Department:  [facility] - [phone]  Other:     Hypoxic to 85% on room air on arrival requiring 2 liters NC.  She feels like she is in fluid overload and ran out of her Bumex recently.  BNP elevated but improved from last visit, chest film is clear.  Also has a history of COPD well maintained, being treated with Duoneb.  I've ordered IV Bumex.

## 2021-01-24 NOTE — ED Notes (Signed)
Went to PCP for SOB and weakness since Thursday, progressively getting worse    Pt was 85% on Ra for PCP. Placed on 2L NC with improvement    Pt with paced rhythm, 1degree avb.      Hx of CHF and COPD

## 2021-01-25 DIAGNOSIS — I5033 Acute on chronic diastolic (congestive) heart failure: Secondary | ICD-10-CM

## 2021-01-25 LAB — METABOLIC PANEL, BASIC
Anion gap: 6 mmol/L (ref 5–15)
BUN/Creatinine ratio: 11 — ABNORMAL LOW (ref 12–20)
BUN: 14 mg/dL (ref 6–20)
CO2: 33 mmol/L — ABNORMAL HIGH (ref 21–32)
Calcium: 8.8 mg/dL (ref 8.5–10.1)
Chloride: 102 mmol/L (ref 97–108)
Creatinine: 1.29 MG/DL — ABNORMAL HIGH (ref 0.55–1.02)
Glucose: 76 mg/dL (ref 65–100)
Potassium: 3.7 mmol/L (ref 3.5–5.1)
Sodium: 141 mmol/L (ref 136–145)
eGFR: 46 mL/min/{1.73_m2} — ABNORMAL LOW (ref >60)

## 2021-01-25 LAB — GLUCOSE, POC
Glucose (POC): 89 mg/dL (ref 65–117)
Glucose (POC): 83 mg/dL (ref 65–117)
Glucose (POC): 133 mg/dL — ABNORMAL HIGH (ref 65–117)
Glucose (POC): 102 mg/dL (ref 65–117)

## 2021-01-25 LAB — RESPIRATORY VIRUS PANEL W/COVID-19, PCR
Adenovirus: NOT DETECTED
Adenovirus: NOT DETECTED
B. parapertussis, PCR: NOT DETECTED
BORDETELLA PARAPERTUSSIS PCR, BORPAR: NOT DETECTED
Bordetella pertussis - PCR: NOT DETECTED
Bordetella pertussis by PCR: NOT DETECTED
CORONAVIRUS 229E: NOT DETECTED
CORONAVIRUS HKU1: NOT DETECTED
CORONAVIRUS NL63: NOT DETECTED
CORONAVIRUS OC43: NOT DETECTED
Chlamydophila pneumoniae DNA, QL, PCR: NOT DETECTED
Chlamydophilia pneumoniae by PCR: NOT DETECTED
Coronavirus 229E: NOT DETECTED
Coronavirus CVNL63: NOT DETECTED
Coronavirus HKU1: NOT DETECTED
Coronavirus OC43: NOT DETECTED
INFLUENZA A: NOT DETECTED
INFLUENZA B: NOT DETECTED
Influenza A: NOT DETECTED
Influenza B: NOT DETECTED
Metapneumovirus: NOT DETECTED
Metapneumovirus: NOT DETECTED
Mycoplasma pneumoniae DNA, QL, PCR: NOT DETECTED
Mycoplasma pneumoniae by PCR: NOT DETECTED
PARAINFLUENZA 4: NOT DETECTED
Parainfluenza 1: NOT DETECTED
Parainfluenza 1: NOT DETECTED
Parainfluenza 2: NOT DETECTED
Parainfluenza 2: NOT DETECTED
Parainfluenza 3: NOT DETECTED
Parainfluenza 3: NOT DETECTED
Parainfluenza virus 4: NOT DETECTED
RSV by PCR: NOT DETECTED
RSV by PCR: NOT DETECTED
Rhinovirus Enterovirus PCR: NOT DETECTED
Rhinovirus and Enterovirus: NOT DETECTED
SARS Coronavirus-2: NOT DETECTED
SARS-CoV-2, PCR: NOT DETECTED

## 2021-01-25 LAB — NT-PRO BNP: NT pro-BNP: 1486 PG/ML — ABNORMAL HIGH (ref <125)

## 2021-01-25 LAB — CBC W/O DIFF
ABSOLUTE NRBC: 0 10*3/uL (ref 0.00–0.01)
HCT: 35.1 % (ref 35.0–47.0)
HGB: 10.9 g/dL — ABNORMAL LOW (ref 11.5–16.0)
MCH: 29.9 pg (ref 26.0–34.0)
MCHC: 31.1 g/dL (ref 30.0–36.5)
MCV: 96.4 fL (ref 80.0–99.0)
MPV: 10 fL (ref 8.9–12.9)
NRBC: 0 /100{WBCs}
PLATELET: 78 10*3/uL — ABNORMAL LOW (ref 150–400)
RBC: 3.64 M/uL — ABNORMAL LOW (ref 3.80–5.20)
RDW: 14.2 % (ref 11.5–14.5)
WBC: 4.8 10*3/uL (ref 3.6–11.0)

## 2021-01-25 LAB — MAGNESIUM
Magnesium: 1.6 mg/dL (ref 1.6–2.4)
Magnesium: 1.6 mg/dL (ref 1.6–2.4)

## 2021-01-25 LAB — HEPATIC FUNCTION PANEL
A-G Ratio: 0.9 — ABNORMAL LOW (ref 1.1–2.2)
ALT (SGPT): 15 U/L (ref 12–78)
ALT: 15 U/L (ref 12–78)
AST (SGOT): 13 U/L — ABNORMAL LOW (ref 15–37)
AST: 13 U/L — ABNORMAL LOW (ref 15–37)
Albumin/Globulin Ratio: 0.9 — ABNORMAL LOW (ref 1.1–2.2)
Albumin: 3.5 g/dL (ref 3.5–5.0)
Albumin: 3.5 g/dL (ref 3.5–5.0)
Alk. phosphatase: 55 U/L (ref 45–117)
Alkaline Phosphatase: 55 U/L (ref 45–117)
Bilirubin, Direct: 0.3 MG/DL — ABNORMAL HIGH (ref 0.0–0.2)
Bilirubin, direct: 0.3 MG/DL — ABNORMAL HIGH (ref 0.0–0.2)
Bilirubin, total: 2.2 MG/DL — ABNORMAL HIGH (ref 0.2–1.0)
Globulin: 3.7 g/dL (ref 2.0–4.0)
Globulin: 3.7 g/dL (ref 2.0–4.0)
Protein, total: 7.2 g/dL (ref 6.4–8.2)
Total Bilirubin: 2.2 MG/DL — ABNORMAL HIGH (ref 0.2–1.0)
Total Protein: 7.2 g/dL (ref 6.4–8.2)

## 2021-01-25 LAB — TSH 3RD GENERATION
TSH: 10.6 u[IU]/mL — ABNORMAL HIGH (ref 0.36–3.74)
TSH: 10.6 u[IU]/mL — ABNORMAL HIGH (ref 0.36–3.74)

## 2021-01-25 LAB — PHOSPHORUS
Phosphorus: 4 mg/dL (ref 2.6–4.7)
Phosphorus: 4 MG/DL (ref 2.6–4.7)

## 2021-01-25 LAB — CBC
Hematocrit: 35.1 % (ref 35.0–47.0)
Hemoglobin: 10.9 g/dL — ABNORMAL LOW (ref 11.5–16.0)
MCH: 29.9 PG (ref 26.0–34.0)
MCHC: 31.1 g/dL (ref 30.0–36.5)
MCV: 96.4 FL (ref 80.0–99.0)
MPV: 10 FL (ref 8.9–12.9)
NRBC Absolute: 0 10*3/uL (ref 0.00–0.01)
Nucleated RBCs: 0 PER 100 WBC
Platelets: 78 10*3/uL — ABNORMAL LOW (ref 150–400)
RBC: 3.64 M/uL — ABNORMAL LOW (ref 3.80–5.20)
RDW: 14.2 % (ref 11.5–14.5)
WBC: 4.8 10*3/uL (ref 3.6–11.0)

## 2021-01-25 LAB — POCT GLUCOSE
POC Glucose: 89 mg/dL (ref 65–117)
POC Glucose: 83 mg/dL (ref 65–117)
POC Glucose: 133 mg/dL — ABNORMAL HIGH (ref 65–117)
POC Glucose: 102 mg/dL (ref 65–117)

## 2021-01-25 LAB — BASIC METABOLIC PANEL
Anion Gap: 6 mmol/L (ref 5–15)
BUN: 14 MG/DL (ref 6–20)
Bun/Cre Ratio: 11 — ABNORMAL LOW (ref 12–20)
CO2: 33 mmol/L — ABNORMAL HIGH (ref 21–32)
Calcium: 8.8 MG/DL (ref 8.5–10.1)
Chloride: 102 mmol/L (ref 97–108)
Creatinine: 1.29 MG/DL — ABNORMAL HIGH (ref 0.55–1.02)
ESTIMATED GLOMERULAR FILTRATION RATE: 46 mL/min/{1.73_m2} — ABNORMAL LOW (ref >60)
Glucose: 76 mg/dL (ref 65–100)
Potassium: 3.7 mmol/L (ref 3.5–5.1)
Sodium: 141 mmol/L (ref 136–145)

## 2021-01-25 LAB — PROBNP, N-TERMINAL: BNP: 1486 PG/ML — ABNORMAL HIGH (ref <125)

## 2021-01-25 MED ORDER — LEVOTHYROXINE 125 MCG TAB
125 mcg | Freq: Every day | ORAL | Status: DC
Start: 2021-01-25 — End: 2021-01-25
  Administered 2021-01-25: 12:00:00 via ORAL

## 2021-01-25 MED ORDER — CITALOPRAM 20 MG TAB
20 mg | Freq: Every day | ORAL | Status: AC
Start: 2021-01-25 — End: 2021-01-26
  Administered 2021-01-26: 13:00:00 via ORAL

## 2021-01-25 MED ORDER — MONTELUKAST 10 MG TAB
10 mg | Freq: Every evening | ORAL | Status: AC
Start: 2021-01-25 — End: 2021-01-26
  Administered 2021-01-25 – 2021-01-26 (×2): via ORAL

## 2021-01-25 MED ORDER — ASPIRIN 81 MG CHEWABLE TAB
81 mg | Freq: Every day | ORAL | Status: AC
Start: 2021-01-25 — End: 2021-01-26
  Administered 2021-01-25 – 2021-01-26 (×2): via ORAL

## 2021-01-25 MED ORDER — TRAZODONE 50 MG TAB
50 mg | Freq: Every evening | ORAL | Status: AC
Start: 2021-01-25 — End: 2021-01-26
  Administered 2021-01-25 – 2021-01-26 (×2): via ORAL

## 2021-01-25 MED ORDER — SENNOSIDES-DOCUSATE SODIUM 8.6 MG-50 MG TAB
Freq: Two times a day (BID) | ORAL | Status: AC
Start: 2021-01-25 — End: 2021-01-26
  Administered 2021-01-25 – 2021-01-26 (×4): via ORAL

## 2021-01-25 MED ORDER — MAGNESIUM SULFATE 2 GRAM/50 ML IVPB
2 gram/50 mL (4 %) | Freq: Once | INTRAVENOUS | Status: AC
Start: 2021-01-25 — End: 2021-01-25
  Administered 2021-01-25: 14:00:00 via INTRAVENOUS

## 2021-01-25 MED ORDER — LEVOTHYROXINE 150 MCG TAB
150 mcg | Freq: Every day | ORAL | Status: AC
Start: 2021-01-25 — End: 2021-01-26
  Administered 2021-01-26: 13:00:00 via ORAL

## 2021-01-25 MED ORDER — SUCRALFATE 1 GRAM TAB
1 gram | Freq: Three times a day (TID) | ORAL | Status: AC
Start: 2021-01-25 — End: 2021-01-26
  Administered 2021-01-25 – 2021-01-26 (×5): via ORAL

## 2021-01-25 MED ORDER — ATORVASTATIN 20 MG TAB
20 mg | Freq: Every evening | ORAL | Status: AC
Start: 2021-01-25 — End: 2021-01-26
  Administered 2021-01-25 – 2021-01-26 (×2): via ORAL

## 2021-01-25 MED ORDER — PROMETHAZINE 25 MG TAB
25 mg | Freq: Four times a day (QID) | ORAL | Status: DC | PRN
Start: 2021-01-25 — End: 2021-01-26

## 2021-01-25 MED ORDER — ATORVASTATIN 20 MG TAB
20 mg | Freq: Every evening | ORAL | Status: DC
Start: 2021-01-25 — End: 2021-01-24

## 2021-01-25 MED ORDER — ACETAMINOPHEN 650 MG RECTAL SUPPOSITORY
650 mg | Freq: Four times a day (QID) | RECTAL | Status: AC | PRN
Start: 2021-01-25 — End: 2021-01-26

## 2021-01-25 MED ORDER — ONDANSETRON (PF) 4 MG/2 ML INJECTION
4 mg/2 mL | Freq: Four times a day (QID) | INTRAMUSCULAR | Status: AC | PRN
Start: 2021-01-25 — End: 2021-01-26

## 2021-01-25 MED ORDER — SODIUM CHLORIDE 0.9 % IJ SYRG
Freq: Three times a day (TID) | INTRAMUSCULAR | Status: AC
Start: 2021-01-25 — End: 2021-01-26
  Administered 2021-01-25 – 2021-01-26 (×3): via INTRAVENOUS

## 2021-01-25 MED ORDER — SODIUM CHLORIDE 0.9 % IV
INTRAVENOUS | Status: AC | PRN
Start: 2021-01-25 — End: 2021-01-26

## 2021-01-25 MED ORDER — SODIUM CHLORIDE 0.9 % IJ SYRG
INTRAMUSCULAR | Status: AC | PRN
Start: 2021-01-25 — End: 2021-01-26

## 2021-01-25 MED ORDER — METOPROLOL SUCCINATE SR 50 MG 24 HR TAB
50 mg | Freq: Every day | ORAL | Status: AC
Start: 2021-01-25 — End: 2021-01-26
  Administered 2021-01-25: 14:00:00 via ORAL

## 2021-01-25 MED ORDER — BISACODYL 10 MG RECTAL SUPPOSITORY
10 mg | Freq: Every day | RECTAL | Status: AC | PRN
Start: 2021-01-25 — End: 2021-01-26

## 2021-01-25 MED ORDER — ACETAMINOPHEN 325 MG TABLET
325 mg | Freq: Four times a day (QID) | ORAL | Status: AC | PRN
Start: 2021-01-25 — End: 2021-01-26

## 2021-01-25 MED ORDER — LEVOTHYROXINE 112 MCG TAB
112 mcg | Freq: Every day | ORAL | Status: DC
Start: 2021-01-25 — End: 2021-01-24

## 2021-01-25 MED FILL — SENNOSIDES-DOCUSATE SODIUM 8.6 MG-50 MG TAB: ORAL | Qty: 1

## 2021-01-25 MED FILL — BD POSIFLUSH NORMAL SALINE 0.9 % INJECTION SYRINGE: INTRAMUSCULAR | Qty: 40

## 2021-01-25 MED FILL — METOPROLOL SUCCINATE SR 25 MG 24 HR TAB: 25 mg | ORAL | Qty: 2

## 2021-01-25 MED FILL — MONTELUKAST 10 MG TAB: 10 mg | ORAL | Qty: 1

## 2021-01-25 MED FILL — OXYCODONE 5 MG TAB: 5 mg | ORAL | Qty: 1

## 2021-01-25 MED FILL — CARAFATE 1 GRAM TABLET: 1 gram | ORAL | Qty: 1

## 2021-01-25 MED FILL — SODIUM CHLORIDE 0.9 % IV: INTRAVENOUS | Qty: 50

## 2021-01-25 MED FILL — ATORVASTATIN 20 MG TAB: 20 mg | ORAL | Qty: 1

## 2021-01-25 MED FILL — BUMETANIDE 0.25 MG/ML IJ SOLN: 0.25 mg/mL | INTRAMUSCULAR | Qty: 4

## 2021-01-25 MED FILL — CITALOPRAM 20 MG TAB: 20 mg | ORAL | Qty: 2

## 2021-01-25 MED FILL — TRAZODONE 50 MG TAB: 50 mg | ORAL | Qty: 2

## 2021-01-25 MED FILL — MAGNESIUM SULFATE 2 GRAM/50 ML IVPB: 2 gram/50 mL (4 %) | INTRAVENOUS | Qty: 50

## 2021-01-25 MED FILL — ASPIRIN 81 MG CHEWABLE TAB: 81 mg | ORAL | Qty: 1

## 2021-01-25 MED FILL — LEVOTHYROXINE 125 MCG TAB: 125 mcg | ORAL | Qty: 1

## 2021-01-25 NOTE — Consults (Signed)
[facility]                                                          Cardiology Care Note               Myca Perno A. Hondo Nanda, MD, [facility]                       [address], [address], [place], [place] [address]             Phone [phone]; Fax [phone]           Mobile [phone]   Voice Mail [phone]     Initial Encounter     Follow-up    Patient Name: Deborah [name] - DOB:[birth year] - PPI:[id number]  Primary Cardiologist: [facility] Physicians: Thurston Pounds MD  Consulting Cardiologist: [facility] Physicians: Rushie Chestnut MD     Reason for encounter:    HPI:       Deborah [name] is a 65 y.o. female with PMH significant for hypertension, bioprosthetic aortic valve, PPM by Dr. Loma Newton,, T2DM, and diastolic heart failure.  She describes worsening shortness of breath with exertion as well as increasing lower extremity edema and orthopnea.  She was not experiencing any chest discomfort in the emergency room her initial O2 saturation was 86% on room air and a BNP of 1737    She was recently hospitalized for ischemic colitis and at that time was told she had congestive heart failure.  She was discharged with a prescription for Bumex to take for 2 weeks but then ran out.  She has been out of the Bumex but has been taking Lasix.  She does have a history of COPD and uses albuterol at home.            Alashia Brownfield reports dyspnea.     Cardiac risk factors: dyslipidemia, diabetes mellitus, hypertension, post-menopausal.     Assessment and Plan     1) A/C Diastolic CHF (EF 06-30%) Nov 2022: ran out of bumex, lasix ineffective.   -Continue IV Bumex today  - would discharge on bumex 1 mg BID   - strict I/O  - reinforced low na diet  - daily weights   - wean oxygen , home oxygen assessment   - needs OP stress test     2) HTN:     3)PVCs/PPM  -Follow labs   -Continue Toprol     3) DM type 2 w/ renal  complications  -Hemoglobin A1c goal should be 7 or less     4) Hypothyroid: check TSH.  Cont synthroid    5) history of aortic valve replacement    6) morbid obesity BMI 53    7) dyslipidemia  Lab Results   Component Value Date/Time    Cholesterol, total 106 04/30/2020 12:57 AM    HDL Cholesterol 76 04/30/2020 12:57 AM    LDL, calculated 12.6 04/30/2020 12:57 AM    VLDL, calculated 17.4 04/30/2020 12:57 AM    Triglyceride 87 04/30/2020 12:57 AM    CHOL/HDL Ratio 1.4 04/30/2020 12:57 AM     7 OSA:   - wears CPAP at home               ____________________________________________________________  Cardiac testing    11/27/20    ECHO ADULT COMPLETE 11/28/2020 11/28/2020    Interpretation Summary    Left Ventricle: Not well visualized. Normal left ventricular systolic function with a visually estimated EF of 65 - 70%. Not well visualized. Left ventricle size is normal. Moderately increased wall thickness. Unable to assess wall motion.    Right Ventricle: Not well visualized.    Aortic Valve: Not well visualized. Bioprosthetic valve.  Suboptimal Doppler, however velocity > 3 m/sec appreciated across aortic valve.    Technical qualifiers: Echo study was technically difficult with poor endocardial visualization and technically difficult due to patient's body habitus.    Contrast used: Definity.    Signed by: Candyce Churn, DO on 11/28/2020  2:54 PM              Most recent HS troponins:  Recent Labs     01/24/21  1346   TROPHS 11       ECG:   EKG Results       Procedure 720 Value Units Date/Time    EKG, 12 LEAD, INITIAL [[id number]]     Order Status: Sent               Review of Systems:    [x] All other systems reviewed and all negative except as written in HPI    []  Patient unable to provide secondary to condition    Past Medical History:   Diagnosis Date    Anxiety and depression     Aortic valve replaced     Asthma     Chronic narcotic use     Chronic pain     CKD (chronic kidney disease), stage III (HCC)     DM type 2  causing renal disease (HCC)     GERD (gastroesophageal reflux disease)     Hyperlipidemia     Hypothyroid     Morbid obesity (HCC)     Neuropathy     Pacemaker     .    Rhinitis      Past Surgical History:   Procedure Laterality Date    COLONOSCOPY N/A 12/01/2020    COLONOSCOPY performed by [employer], MD at [facility] ENDOSCOPY    HX PACEMAKER       Social Hx:  reports that she quit smoking about 10 years ago. Her smoking use included cigarettes. She has a 20.00 pack-year smoking history. She has never used smokeless tobacco. She reports that she does not currently use alcohol. She reports that she does not use drugs.  Family Hx: family history includes Hypertension in her father and mother.  Allergies   Allergen Reactions    Nitroglycerin Unknown (comments)     hypotension    Aloe Vera Rash    Hydrochlorothiazide Other (comments)     Reports 'kidneys dry up"     Tetanus And Diphther. Tox (Pf) Swelling     Swelling of arm and it turns black          OBJECTIVE:  Wt Readings from Last 3 Encounters:   12/03/20 341 lb 3.2 oz (154.8 kg)   11/05/20 335 lb (152 kg)   08/26/20 336 lb (152.4 kg)       Intake/Output Summary (Last 24 hours) at 01/25/2021 0723  Last data filed at 01/25/2021 0421  Gross per 24 hour   Intake --   Output 1200 ml   Net -1200 ml       Physical Exam:  Vitals:   Vitals:    01/24/21 2056 01/24/21 2301 01/24/21 2312 01/25/21 0421   BP: (!) 104/50  (!) 110/44 (!) 109/58   Pulse: 65 65 80 98   Resp: Temp: 98.1 ??F (36.7 ??C)  98.4 ??F (36.9 ??C) 98.1 ??F (36.7 ??C)   SpO2: 99%  94% 95%     Telemetry: NSR     Gen: Well-developed, well-nourished, in no acute distress  Neck: Supple, No JVD, No Carotid Bruit  Resp: No accessory muscle use, diminished bases   Card: Regular Rate,Rythm, Normal S1, S2, No murmurs, rubs or gallop.   Abd:   Soft, non-tender, non-distended, BS+   MSK: No cyanosis  Skin: No rashes    Neuro: Moving all four extremities, follows commands appropriately  Psych: Good  insight, oriented to person, place, alert, Nml Affect  LE: tense edema     03/04/2008: [employer] dual chamber pacemaker per Dr. Theodoro Clock    Data Review:     Radiology:   XR Results (most recent):  Results from Hospital Encounter encounter on 01/24/21    XR CHEST PA LAT    Narrative  Indication:  SOB, hx CHF    Exam: PA and lateral views of the chest.    Direct comparison is made to prior CT dated October 2022.    Findings: Cardiomediastinal silhouette is stable. Median sternotomy wires are  noted. Intact pacer leads extending to the right atrium and right ventricle.  Lungs are clear bilaterally. Pleural spaces are normal. Osseous structures are  intact.    Impression  No acute cardiopulmonary disease.      Recent Labs     01/25/21  0422 01/24/21  1346   NA 141 142   K 3.7 3.8   CL 102 105   CO2 33* 34*   BUN 14 13   CREA 1.29* 1.39*   GLU 76 99   PHOS 4.0  --    CA 8.8 8.5     Recent Labs     01/25/21  0422 01/24/21  1346   WBC 4.8 4.5   HGB 10.9* 11.3*   HCT 35.1 36.6   PLT 78* 81*     Recent Labs     01/25/21  0422 01/24/21  1346   PTP  --  11.8*   INR  --  1.1   AP 55 56     No results for input(s): CHOL, LDLC in the last 72 hours.    No lab exists for component: TGL, HDLC,  HBA1C      Current meds:    Current Facility-Administered Medications:     bumetanide (BUMEX) injection 1 mg, 1 mg, IntraVENous, Q12H, Do, Khoi B, MD, 1 mg at 01/24/21 2315    albuterol-ipratropium (DUO-NEB) 2.5 MG-0.5 MG/3 ML, 3 mL, Nebulization, Q4H PRN, Do, Khoi B, MD    HYDROmorphone (DILAUDID) injection 1 mg, 1 mg, IntraVENous, Q4H PRN, Do, Khoi B, MD    oxyCODONE IR (ROXICODONE) tablet 5 mg, 5 mg, Oral, Q4H PRN, Do, Khoi B, MD, 5 mg at 01/25/21 0050    insulin lispro (HUMALOG) injection, , SubCUTAneous, AC&HS, Do, Khoi B, MD    glucose chewable tablet 16 g, 4 Tablet, Oral, PRN, Do, Khoi B, MD    glucagon (GLUCAGEN) injection 1 mg, 1 mg, IntraMUSCular, PRN, Do, Khoi B, MD    aspirin chewable tablet 81 mg, 81 mg, Oral,  DAILY, Do, Twanna Hy, MD  citalopram (CELEXA) tablet 40 mg, 40 mg, Oral, DAILY, Do, Khoi B, MD    metoprolol succinate (TOPROL-XL) XL tablet 50 mg, 50 mg, Oral, DAILY, Do, Khoi B, MD    montelukast (SINGULAIR) tablet 10 mg, 10 mg, Oral, QHS, Do, Khoi B, MD, 10 mg at 01/24/21 2315    sucralfate (CARAFATE) tablet 1 g, 1 g, Oral, TID, Do, Khoi B, MD, 1 g at 01/24/21 2315    traZODone (DESYREL) tablet 100 mg, 100 mg, Oral, QHS, Do, Khoi B, MD, 100 mg at 01/24/21 2315    sodium chloride (NS) flush 5-40 mL, 5-40 mL, IntraVENous, Q8H, Do, Khoi B, MD, 10 mL at 01/25/21 0720    sodium chloride (NS) flush 5-40 mL, 5-40 mL, IntraVENous, PRN, Do, Khoi B, MD    acetaminophen (TYLENOL) tablet 650 mg, 650 mg, Oral, Q6H PRN **OR** acetaminophen (TYLENOL) suppository 650 mg, 650 mg, Rectal, Q6H PRN, Do, Khoi B, MD    senna-docusate (PERICOLACE) 8.6-50 mg per tablet 1 Tablet, 1 Tablet, Oral, BID, Do, Khoi B, MD, 1 Tablet at 01/24/21 2315    bisacodyL (DULCOLAX) suppository 10 mg, 10 mg, Rectal, DAILY PRN, Do, Khoi B, MD    promethazine (PHENERGAN) tablet 12.5 mg, 12.5 mg, Oral, Q6H PRN **OR** ondansetron (ZOFRAN) injection 4 mg, 4 mg, IntraVENous, Q6H PRN, Do, Khoi B, MD    atorvastatin (LIPITOR) tablet 10 mg, 10 mg, Oral, QHS, Do, Khoi B, MD, 10 mg at 01/24/21 2315    levothyroxine (SYNTHROID) tablet 125 mcg, 125 mcg, Oral, ACB, Do, Khoi B, MD, 125 mcg at 01/25/21 5284    Current Outpatient Medications:     aspirin 81 mg chewable tablet, Take 1 Tablet by mouth daily., Disp: 30 Tablet, Rfl: 0    lidocaine (Lidoderm) 5 %, Apply patch to the affected area for 12 hours a day and remove for 12 hours a day., Disp: 30 Each, Rfl: 0    metoprolol succinate (TOPROL-XL) 50 mg XL tablet, TAKE 1 TABLET BY MOUTH EVERY DAY, Disp: 90 Tablet, Rfl: 1    acetaminophen (TYLENOL) 500 mg tablet, Take 1,000 mg by mouth every six (6) hours as needed for Pain., Disp: , Rfl:     albuterol (PROVENTIL HFA, VENTOLIN HFA, PROAIR HFA) 90 mcg/actuation inhaler,  Take 2 Puffs by inhalation every six (6) hours as needed., Disp: , Rfl:     atorvastatin (LIPITOR) 80 mg tablet, , Disp: , Rfl:     calcium-cholecalciferol, D3, (CALTRATE 600+D) tablet, Take 1 Tablet by mouth nightly., Disp: , Rfl:     citalopram (CELEXA) 40 mg tablet, Take 40 mg by mouth daily., Disp: , Rfl:     fluticasone propionate (FLONASE) 50 mcg/actuation nasal spray, 2 Sprays by Nasal route daily as needed., Disp: , Rfl:     levothyroxine (SYNTHROID) 137 mcg tablet, TAKE 1 BY MOUTH DAILY, FOLLOW UP IN 90 DAYS FOR RECHECK THYROID LEVEL, Disp: , Rfl:     sucralfate (CARAFATE) 1 gram tablet, Take 1 g by mouth three (3) times daily., Disp: , Rfl:     traZODone (DESYREL) 100 mg tablet, Take 100 mg by mouth nightly., Disp: , Rfl:     Xtampza ER 9 mg capsule, TAKE 1 CAPSULE BY MOUTH EVERY 12 HOURS FILL 02/12/20, Disp: , Rfl:     glimepiride (AMARYL) 2 mg tablet, Take 2 mg by mouth two (2) times a day., Disp: , Rfl:     montelukast (SINGULAIR) 10 mg tablet, Take 10 mg by mouth nightly., Disp: ,  Rfl:     Rushie Chestnut, MD    [facility]  Call center: (P) [phone]  (F) [phone]      CC:Other, Phys, MD

## 2021-01-25 NOTE — Progress Notes (Signed)
 OCCUPATIONAL THERAPY EVALUATION/DISCHARGE  Patient: Deborah [name] (65 y.o. female)  Date: 01/25/2021  Primary Diagnosis: Acute exacerbation of CHF (congestive heart failure) (HCC) [I50.9]  Acute on chronic diastolic (congestive) heart failure (HCC) [I50.33]       Precautions:   Fall    ASSESSMENT  Based on the objective data described below, the patient presents with hospital admission secondary to acute CHF exacerbation.  Patient received in bed agreeable to activity.  She has been medicated for chronic back pain and able to participate at this time. Patient reports AE at home for distal LE dressing, but does not use secondary to preference for foot to hand method. Patient able to don shoes today with increased time and uses single point cane for ambulation.  Patient manages toileting tasks with mod I and able to stand at sink with same level for hand hygiene. Patient left in NAD in bed at end of session.    Functional Outcome Measure: The patient scored 85/100 on the Barthel Index  outcome measure.      Other factors to consider for discharge: none     PLAN :  Recommend with staff:     Recommendation for discharge: (in order for the patient to meet his/her long term goals)  No skilled occupational therapy/ follow up rehabilitation needs identified at this time.    This discharge recommendation:  Has been made in collaboration with the attending provider and/or case management    IF patient discharges home will need the following DME: TBD       SUBJECTIVE:   Patient stated "I just pull my leg up like this ."    OBJECTIVE DATA SUMMARY:   HISTORY:   Past Medical History:   Diagnosis Date   . Anxiety and depression    . Aortic valve replaced    . Asthma    . Chronic narcotic use    . Chronic pain    . CKD (chronic kidney disease), stage III (HCC)    . 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)    . GERD (gastroesophageal reflux disease)    . Hyperlipidemia    . Hypothyroid    . Morbid obesity (HCC)    . Neuropathy    .  Pacemaker     [employer].   SABRA Rhinitis      Past Surgical History:   Procedure Laterality Date   . COLONOSCOPY N/A 12/01/2020    COLONOSCOPY performed by Jama Sayre, MD at [facility] ENDOSCOPY   . HX PACEMAKER         Prior Level of Function/Environment/Context: mod I  Expanded or extensive additional review of patient history:   Home Situation  Home Environment: Private residence  # Steps to Enter: 5  Rails to Enter: Yes  One/Two Story Residence: One story  Living Alone: No  Support Systems: Child(ren)  Patient Expects to be Discharged to:: Home  Current DME Used/Available at Home: Wheelchair, Environmental consultant, rolling, [place], straight, Adaptive dressing aides    Hand dominance: Right    EXAMINATION OF PERFORMANCE DEFICITS:  Cognitive/Behavioral Status:                      Skin: intact as seen    Edema: none noted     Hearing:  Auditory  Auditory Impairment: None    Vision/Perceptual:  Range of Motion:  AROM: Within functional limits  PROM: Within functional limits                      Strength:  Strength: Within functional limits                Coordination:               Tone & Sensation:  Normal tone, sensation intact                            Balance:  Sitting: Intact  Standing: Intact    Functional Mobility and Transfers for ADLs:  Bed Mobility:  Rolling: Independent  Supine to Sit: Independent    Transfers:  Sit to Stand: Modified independent  Stand to Sit: Modified independent  Bed to Chair: Modified independent  Bathroom Mobility: Modified independent  Toilet Transfer : Modified independent  Assistive Device : Cane, straight    ADL Assessment:  Feeding: Independent    Oral Facial Hygiene/Grooming: Modified Independent    Bathing: Modified independent         Upper Body Dressing: Modified independent    Lower Body Dressing: Modified independent    Toileting: Modified independent                ADL Intervention and task modifications:                                                Therapeutic Exercise:     Functional Measure:    Barthel Index:  Bathing: 5  Bladder: 10  Bowels: 10  Grooming: 5  Dressing: 10  Feeding: 10  Mobility: 10  Stairs: 5  Toilet Use: 10  Transfer (Bed to Chair and Back): 10  Total: 85/100      The Barthel ADL Index: Guidelines  1. The index should be used as a record of what a patient does, not as a record of what a patient could do.  2. The main aim is to establish degree of independence from any help, physical or verbal, however minor and for whatever reason.  3. The need for supervision renders the patient not independent.  4. A patient's performance should be established using the best available evidence. Asking the patient, friends/relatives and nurses are the usual sources, but direct observation and common sense are also important. However direct testing is not needed.  5. Usually the patient's performance over the preceding 24-48 hours is important, but occasionally longer periods will be relevant.  6. Middle categories imply that the patient supplies over 50 per cent of the effort.  7. Use of aids to be independent is allowed.    Score Interpretation (from Sinoff 1997)   80-100 Independent   60-79 Minimally independent   40-59 Partially dependent   20-39 Very dependent   <20 Totally dependent     -Mahoney, F.l., Barthel, D.W. (1965). Functional evaluation: the Barthel Index. [place] [address] J (14)2.  -Sinoff, G., Ore, L. (1997). The Barthel activities of daily living index: self-reporting versus actual performance in the old (> or = 75 years). Journal of American Geriatric Society 45(7), [phone].   -Fleeta cotton Culp, J.J.M.F, Orman ROES., Oneita MERYL Sebastian DELORSE. (1999). Measuring the change in disability after inpatient rehabilitation; comparison of the responsiveness  of the Barthel Index and Functional Independence Measure. Journal of Neurology, Neurosurgery, and Psychiatry, 66(4), [phone].  Marea Chillington, N.J.A, Scholte op [place],  W.J.M, & [place]op[place],  M.A. (2004) Assessment of post-stroke quality of life in cost-effectiveness studies: The usefulness of the Barthel Index and the EuroQoL-5D. Quality of Life Research, 23, 572-56         Occupational Therapy Evaluation Charge Determination   History Examination Decision-Making   LOW Complexity : Brief history review  LOW Complexity : 1-3 performance deficits relating to physical, cognitive , or psychosocial skils that result in activity limitations and / or participation restrictions  LOW Complexity : No comorbidities that affect functional and no verbal or physical assistance needed to complete eval tasks       Based on the above components, the patient evaluation is determined to be of the following complexity level: LOW   Pain Rating:  Back pain- managed by pain medication    Activity Tolerance:   Good    After treatment patient left in no apparent distress:    Supine in bed and Call bell within reach    COMMUNICATION/EDUCATION:   The patient's plan of care was discussed with: Physical therapist and Registered nurse.     Thank you for this referral.  Jocelyn C Skipper, OTR/L  Time Calculation: 15 mins

## 2021-01-25 NOTE — Other (Addendum)
Therapy Evaluation by Mick Sell, PT, DPT at 01/25/21 1105                Author: Mick Sell, PT, DPT  Service: Physical Therapy  Author Type: Physical Therapist       Filed: 01/25/21 1301  Date of Service: 01/25/21 1105  Status: Addendum          Editor: Mick Sell, PT, DPT (Physical Therapist)          Related Notes: Original Note by Mick Sell, PT, DPT (Physical Therapist) filed at  01/25/21 1301               PHYSICAL THERAPY EVALUATION/DISCHARGE   Patient: Deborah [name] (65 y.o. female)   Date: 01/25/2021   Primary Diagnosis: Acute exacerbation of CHF (congestive heart failure) (HCC) [I50.9]   Acute on chronic diastolic (congestive) heart failure (HCC) [I50.33]         Precautions:   Fall           ASSESSMENT   Based on the objective data described below, the patient presents with dyspnea and CHF exacerbation.  Patient today received on 2L, but able to tolerate session on room air. Patient normally sedentary  at home, only ambulates household distances with [facility].  Today she is MOD I with all upright activity using SPC.  NO loss of balance.  She is at her baseline for mobility.  She was able to complete all tasks on room air.  See below..   Documentation for home O2:       ROOM AIR      AT REST     O2 SATS   95%  HR   84      ROOM AIR  WITH ACTIVITY  02 SATS   92%  HR   99      (    ) LITERS OF O2  WITH ACTIVITY  O2 SATS     HR         (0    )LITERS OF 02  PATIENT LEFT COMFORTABLY   SITTING/SUPINE  02 SATS   90%  HR   108              Functional Outcome Measure:  The patient scored 17/28 on the tinetti outcome measure .        Other factors to consider for discharge:        Further skilled acute physical therapy is not indicated at this time.          PLAN :   Recommendation for discharge: (in order for the patient to meet his/her long term  goals)   No skilled physical therapy/ follow up rehabilitation needs identified at this time.      This discharge  recommendation:   Has been made in collaboration with the attending provider and/or case management      IF patient discharges home will need the following DME: patient owns DME required for discharge             SUBJECTIVE:     Patient stated .        OBJECTIVE DATA SUMMARY:     HISTORY:       Past Medical History:        Diagnosis  Date         ?  Anxiety and depression       ?  Aortic valve replaced       ?  Asthma       ?  Chronic narcotic use       ?  Chronic pain       ?  CKD (chronic kidney disease), stage III (HCC)       ?  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  GERD (gastroesophageal reflux disease)       ?  Hyperlipidemia       ?  Hypothyroid       ?  Morbid obesity (HCC)       ?  Neuropathy       ?  Pacemaker            [employer].         ?  Rhinitis            Past Surgical History:         Procedure  Laterality  Date          ?  COLONOSCOPY  N/A  12/01/2020          COLONOSCOPY performed by Ian Malkin, MD at [facility] ENDOSCOPY          ?  HX PACEMAKER               Prior level of function: see above   Personal factors and/or comorbidities impacting plan of care: see below      Home Situation   Home Environment: Private residence   # Steps to Enter: 5   Rails to Enter: Yes   One/Two Story Residence: One story   Living Alone: No   Support Systems: Child(ren)   Patient Expects to be Discharged to:: Home   Current DME Used/Available at Home: Wheelchair, Environmental consultant, rolling, [place], straight, Adaptive dressing aides      EXAMINATION/PRESENTATION/DECISION MAKING:      Vitals:             01/25/21 1049  01/25/21 1053  01/25/21 1059  01/25/21 1200           BP:  117/79                 BP 1 Location:  Right upper arm                 BP Patient Position:  At rest  Walking  Rest/post activity       Pulse:  84  92  (!) 108  74     Temp:             Resp:             Weight:                   SpO2:  95% (room air)  (!) 89% (room air)  90% (room air)             Critical Behavior:                   Hearing:    Auditory   Auditory Impairment: None      Range Of Motion:   AROM: Within functional limits               PROM: Within functional limits               Strength:     Strength: Within functional limits  Functional Mobility:   Bed Mobility:   Rolling: Independent   Supine to Sit: Independent           Transfers:   Sit to Stand: Modified independent   Stand to Sit: Modified independent          Bed to Chair: Modified independent                  Balance:    Sitting: Intact   Standing: Intact   Ambulation/Gait Training:   Distance (ft): 50 Feet (ft)   Assistive Device: Gait belt;Cane, straight   Ambulation - Level of Assistance: Modified independent       Gait Description (WDL): Exceptions to WDL                                                        Stairs:                   Functional Measure:   Tinetti test:        Sitting Balance: 1   Arises: 1   Attempts to Rise: 2   Immediate Standing Balance: 1   Standing Balance: 1   Nudged: 1   Eyes Closed: 1   Turn 360 Degrees - Continuous/Discontinuous: 1   Turn 360 Degrees - Steady/Unsteady: 0   Sitting Down: 1   Balance Score: 10 Balance total score   Indication of Gait: 0   R Step Length/Height: 1   L Step Length/Height: 1   R Foot Clearance: 1   L Foot Clearance: 1   Step Symmetry: 1   Step Continuity: 1   Path: 1   Trunk: 0   Walking Time: 0   Gait Score: 7 Gait total score   Total Score: 17/28 Overall total score                Tinetti Tool Score Risk of Falls   <19 = High Fall Risk   19-24 = Moderate Fall Risk   25-28 = Low Fall Risk   Tinetti ME. Performance-Oriented Assessment of Mobility Problems in Elderly Patients. JAGS 1986; [id number]. (Scoring Description: PT Bulletin Feb. 10, 1993)      Older adults: Lonn Georgia et al, 2009; n  = 1000 [place] elderly evaluated with ABC, POMA, ADL, and IADL)    Mean POMA score for males aged 65-79 years = 26.21(3.40)    Mean POMA score for females age 7-79 years = 25.16(4.30)    Mean POMA score for males over 80  years = 23.29(6.02)    Mean POMA score for females over 80 years = 17.20(8.32)                  Physical Therapy Evaluation Charge Determination          History  Examination  Presentation  Decision-Making          MEDIUM  Complexity : 1-2 comorbidities / personal factors will impact the outcome/ POC   MEDIUM Complexity : 3 Standardized tests and measures addressing body structure, function, activity limitation and / or participation in recreation   LOW Complexity : Stable, uncomplicated   Other outcome measures tinetti  MEDIUM         Based on the above components, the patient evaluation is determined to be of the  following complexity level: LOW       Pain Rating:   Low back pain, premedicated prior to session      Activity Tolerance:    Good and Fair         After treatment patient left in no apparent distress:    Supine in bed, Call bell within reach, and Side rails x 3        COMMUNICATION/EDUCATION:     The patients plan of care was discussed with: Occupational therapist and Registered nurse.       Fall prevention education was provided and the patient/caregiver indicated understanding., Patient/family have participated as able in goal setting and plan of care., and Patient/family agree to work  toward stated goals and plan of care.      Thank you for this referral.   Mick Sell, PT, DPT    Time Calculation: 16 mins

## 2021-01-25 NOTE — Consults (Signed)
duplicate

## 2021-01-25 NOTE — Progress Notes (Signed)
[facility]  [address] [place], [place] [address]  [phone]      Hospitalist Progress Note      NAME: Deborah [name]   DOB:  [birth year]  MRM:  [id number]    Date/Time of service: 01/25/2021  11:19 AM       Subjective:     Chief Complaint:  Patient was personally seen and examined by me during this time period.  Chart reviewed.  Edema is improving       Objective:       Vitals:       Last 24hrs VS reviewed since prior progress note. Most recent are:    Visit Vitals  BP 117/79 (BP 1 Location: Right upper arm, BP Patient Position: At rest)   Pulse (!) 108   Temp 98.1 ??F (36.7 ??C)   Resp 16   Wt 154.3 kg (340 lb 2.7 oz)   SpO2 90%   BMI 53.28 kg/m??     SpO2 Readings from Last 6 Encounters:   01/25/21 90%   12/03/20 96%   11/05/20 92%   08/26/20 95%   05/23/20 93%   04/30/20 97%    O2 Flow Rate (L/min): 2 l/min (unable to wean)     Intake/Output Summary (Last 24 hours) at 01/25/2021 1119  Last data filed at 01/25/2021 0754  Gross per 24 hour   Intake --   Output 1500 ml   Net -1500 ml        Exam:     Physical Exam:    Gen:  Well-developed, well-nourished, morbidly obese, in no acute distress  HEENT:  Pink conjunctivae, PERRL, hearing intact to voice, moist mucous membranes  Neck:  Supple, without masses, thyroid non-tender  Resp:  No accessory muscle use, clear breath sounds without wheezes rales or rhonchi  Card:  No murmurs, normal S1, S2 without thrills, 3+ edema  Abd:  Soft, non-tender, non-distended, normoactive bowel sounds are present  Lymph:  No cervical adenopathy  Musc:  No cyanosis or clubbing  Skin:  No rashes  Neuro:  Cranial nerves 3-12 are grossly intact, follows commands appropriately  Psych:  Alert with good insight.  Oriented to person, place, and time    Medications Reviewed: (see below)    Lab Data Reviewed: (see below)    ______________________________________________________________________    Medications:     Current Facility-Administered Medications    Medication Dose Route Frequency    0.9% sodium chloride infusion 25 mL  25 mL IntraVENous PRN    bumetanide (BUMEX) injection 1 mg  1 mg IntraVENous Q12H    albuterol-ipratropium (DUO-NEB) 2.5 MG-0.5 MG/3 ML  3 mL Nebulization Q4H PRN    HYDROmorphone (DILAUDID) injection 1 mg  1 mg IntraVENous Q4H PRN    oxyCODONE IR (ROXICODONE) tablet 5 mg  5 mg Oral Q4H PRN    insulin lispro (HUMALOG) injection   SubCUTAneous AC&HS    glucose chewable tablet 16 g  4 Tablet Oral PRN    glucagon (GLUCAGEN) injection 1 mg  1 mg IntraMUSCular PRN    aspirin chewable tablet 81 mg  81 mg Oral DAILY    citalopram (CELEXA) tablet 40 mg  40 mg Oral DAILY    metoprolol succinate (TOPROL-XL) XL tablet 50 mg  50 mg Oral DAILY    montelukast (SINGULAIR) tablet 10 mg  10 mg Oral QHS    sucralfate (CARAFATE) tablet 1 g  1 g Oral TID    traZODone (DESYREL)  tablet 100 mg  100 mg Oral QHS    sodium chloride (NS) flush 5-40 mL  5-40 mL IntraVENous Q8H    sodium chloride (NS) flush 5-40 mL  5-40 mL IntraVENous PRN    acetaminophen (TYLENOL) tablet 650 mg  650 mg Oral Q6H PRN    Or    acetaminophen (TYLENOL) suppository 650 mg  650 mg Rectal Q6H PRN    senna-docusate (PERICOLACE) 8.6-50 mg per tablet 1 Tablet  1 Tablet Oral BID    bisacodyL (DULCOLAX) suppository 10 mg  10 mg Rectal DAILY PRN    promethazine (PHENERGAN) tablet 12.5 mg  12.5 mg Oral Q6H PRN    Or    ondansetron (ZOFRAN) injection 4 mg  4 mg IntraVENous Q6H PRN    atorvastatin (LIPITOR) tablet 10 mg  10 mg Oral QHS    levothyroxine (SYNTHROID) tablet 125 mcg  125 mcg Oral ACB     Current Outpatient Medications   Medication Sig    aspirin 81 mg chewable tablet Take 1 Tablet by mouth daily.    lidocaine (Lidoderm) 5 % Apply patch to the affected area for 12 hours a day and remove for 12 hours a day.    metoprolol succinate (TOPROL-XL) 50 mg XL tablet TAKE 1 TABLET BY MOUTH EVERY DAY    acetaminophen (TYLENOL) 500 mg tablet Take 1,000 mg by mouth every six (6) hours as needed for Pain.     albuterol (PROVENTIL HFA, VENTOLIN HFA, PROAIR HFA) 90 mcg/actuation inhaler Take 2 Puffs by inhalation every six (6) hours as needed.    atorvastatin (LIPITOR) 80 mg tablet     calcium-cholecalciferol, D3, (CALTRATE 600+D) tablet Take 1 Tablet by mouth nightly.    citalopram (CELEXA) 40 mg tablet Take 40 mg by mouth daily.    fluticasone propionate (FLONASE) 50 mcg/actuation nasal spray 2 Sprays by Nasal route daily as needed.    levothyroxine (SYNTHROID) 137 mcg tablet TAKE 1 BY MOUTH DAILY, FOLLOW UP IN 90 DAYS FOR RECHECK THYROID LEVEL    sucralfate (CARAFATE) 1 gram tablet Take 1 g by mouth three (3) times daily.    traZODone (DESYREL) 100 mg tablet Take 100 mg by mouth nightly.    Xtampza ER 9 mg capsule TAKE 1 CAPSULE BY MOUTH EVERY 12 HOURS FILL 02/12/20    glimepiride (AMARYL) 2 mg tablet Take 2 mg by mouth two (2) times a day.    montelukast (SINGULAIR) 10 mg tablet Take 10 mg by mouth nightly.          Lab Review:     Recent Labs     01/25/21  0422 01/24/21  1346   WBC 4.8 4.5   HGB 10.9* 11.3*   HCT 35.1 36.6   PLT 78* 81*     Recent Labs     01/25/21  0422 01/24/21  1346   NA 141 142   K 3.7 3.8   CL 102 105   CO2 33* 34*   GLU 76 99   BUN 14 13   CREA 1.29* 1.39*   CA 8.8 8.5   MG 1.6 1.5*   PHOS 4.0  --    ALB 3.5 3.4*   TBILI 2.2* 2.1*   ALT 15 14   INR  --  1.1     Lab Results   Component Value Date/Time    Glucose (POC) 83 01/25/2021 07:43 AM    Glucose (POC) 89 01/24/2021 11:11 PM    Glucose (POC) 91 01/24/2021  06:29 PM    Glucose (POC) 185 (H) 12/03/2020 12:34 PM    Glucose (POC) 141 (H) 12/03/2020 08:11 AM          Assessment / Plan:     65 yo hx of HTN, DM, dCHF, PVCs s/p pacer, bioprosthetic AVR, presented w/ dyspnea, CHF     1) Dyspnea/hypoxia/acute on chronic dCHF: last EF 65-70%.  Chest CT neg for PE.  Will monitor on Tele.  Cont IV bumex, monitor I/O.  Cards following     2) HTN/PVCs: s/p pacer.  Cont toprol     3) DM type 2 w/ renal complications: A1C  pending.  Hold amaryl.  Cont SSI      4) Hypothyroid: TSH 10.60.  Will increase synthroid     5) Chronic pain: cont oxycodone, use IV dilaudid prn severe pain     7) Thrombocytopenia: seems chronic.  Will monitor     Total time spent with patient care: 35 min  **I personally saw and examined the patient during this time period**                 Care Plan discussed with: Patient, nursing, Cards     Discussed:  Care Plan    Prophylaxis:  SCD's (due to thrombocytopenia)    Disposition:  Home w/Family           ___________________________________________________    Attending Physician: Dayna Barker, MD

## 2021-01-25 NOTE — Progress Notes (Signed)
1940 Bedside and Verbal shift change report given to EXHBZJI RN (oncoming nurse) by Bella Kennedy RN (offgoing nurse). Report included the following information SBAR, Kardex, MAR, and Recent Results.     0100 Patient was moved from stretcher to hospital bed while holding in ER. Patient has c/o sciatic pain back. Medicated with oxycodone 5 mg po prn dose. RAted pain 7 out of 10.     0800 Bedside and Verbal shift change report given to Lelon Mast RN (oncoming nurse) by Laverna Peace RN (offgoing nurse). Report included the following information SBAR, Kardex, MAR, and Recent Results.

## 2021-01-25 NOTE — Care Coordination-Inpatient (Signed)
Chart reviewed by Heart Failure Nurse Navigator.  Heart Failure database completed.     Patient admitted 11/27/20 to 12/03/20 with ischemic colitis, and acute HFpEF exacerbation.  She discharged home with 2 weeks of bumex and instructions to follow up with Cardiology.  She ran out of bumex and resumed using her lasix from previously.  She was unable to be reached by telephone after discharge by this NN and by Dr Wyatt Haste office nurse.  She returned to ED 12/28 for progressing shortness of breath and weakness.  She has PMH of anxiety, depression, AVR, CKD, HFpEF, CKD, OSA with CPAP, HLD, hypothryoid, PPM and DM.    EF:  Recent Echo 11/28/20 with EF of 65 to 70% with moderately increased wall thickness and poor visualization of bioprosthetic aortic valve.     ACEi/ARB/ARNi: **    BB: Metoprolol succinate 50 mg daily    Aldosterone Antagonist: **    Obstructive Sleep Apnea Screening:   Referred to Sleep Medicine:     CRT Not indicated    NYHA Functional Class **.      Heart Failure Teach Back in Patient Education.    Heart Failure Avoiding Triggers on Discharge Instructions.      Cardiologist: Dr Loma Newton    Post discharge follow up phone call to be made within 48-72 hours of discharge.

## 2021-01-25 NOTE — Telephone Encounter (Signed)
LM for pt to call me sll

## 2021-01-25 NOTE — Progress Notes (Signed)
Report received pt laying in bed call bell within reach     2015  TRANSFER - OUT REPORT:    Verbal report given to Ophelia(name) on Deborah [name]  being transferred to 4th floor(unit) for routine progression of care       Report consisted of patient's Situation, Background, Assessment and   Recommendations(SBAR).     Information from the following report(s) SBAR, Kardex, ED Summary, Intake/Output, MAR, and Cardiac Rhythm AV Paced  was reviewed with the receiving nurse.    Lines:   Peripheral IV 01/24/21 Left Antecubital (Active)   Site Assessment Clean, dry, & intact 01/25/21 0915   Phlebitis Assessment 0 01/25/21 0915   Infiltration Assessment 0 01/25/21 0915   Dressing Status Clean, dry, & intact 01/25/21 0915   Dressing Type Transparent;Tape 01/25/21 0915   Hub Color/Line Status Green 01/25/21 0915   Alcohol Cap Used Yes 01/25/21 0421        Opportunity for questions and clarification was provided.      2046  Patient transported with:  Personal belongings    O2 @ 3 liters  Tech

## 2021-01-26 LAB — METABOLIC PANEL, BASIC
Anion gap: 4 mmol/L — ABNORMAL LOW (ref 5–15)
BUN/Creatinine ratio: 11 — ABNORMAL LOW (ref 12–20)
BUN: 14 mg/dL (ref 6–20)
CO2: 36 mmol/L — ABNORMAL HIGH (ref 21–32)
Calcium: 8.8 mg/dL (ref 8.5–10.1)
Chloride: 101 mmol/L (ref 97–108)
Creatinine: 1.24 MG/DL — ABNORMAL HIGH (ref 0.55–1.02)
Glucose: 168 mg/dL — ABNORMAL HIGH (ref 65–100)
Potassium: 3.8 mmol/L (ref 3.5–5.1)
Sodium: 141 mmol/L (ref 136–145)
eGFR: 49 mL/min/{1.73_m2} — ABNORMAL LOW (ref >60)

## 2021-01-26 LAB — HEMOGLOBIN A1C WITH EAG
Est. average glucose: 117 mg/dL
Hemoglobin A1c: 5.7 % — ABNORMAL HIGH (ref 4.0–5.6)

## 2021-01-26 LAB — PHOSPHORUS
Phosphorus: 3 mg/dL (ref 2.6–4.7)
Phosphorus: 3 MG/DL (ref 2.6–4.7)

## 2021-01-26 LAB — NT-PRO BNP: NT pro-BNP: 1300 PG/ML — ABNORMAL HIGH (ref <125)

## 2021-01-26 LAB — GLUCOSE, POC: Glucose (POC): 100 mg/dL (ref 65–117)

## 2021-01-26 LAB — MAGNESIUM
Magnesium: 1.6 mg/dL (ref 1.6–2.4)
Magnesium: 1.6 mg/dL (ref 1.6–2.4)

## 2021-01-26 LAB — BASIC METABOLIC PANEL
Anion Gap: 4 mmol/L — ABNORMAL LOW (ref 5–15)
BUN: 14 MG/DL (ref 6–20)
Bun/Cre Ratio: 11 — ABNORMAL LOW (ref 12–20)
CO2: 36 mmol/L — ABNORMAL HIGH (ref 21–32)
Calcium: 8.8 MG/DL (ref 8.5–10.1)
Chloride: 101 mmol/L (ref 97–108)
Creatinine: 1.24 MG/DL — ABNORMAL HIGH (ref 0.55–1.02)
ESTIMATED GLOMERULAR FILTRATION RATE: 49 mL/min/{1.73_m2} — ABNORMAL LOW (ref >60)
Glucose: 168 mg/dL — ABNORMAL HIGH (ref 65–100)
Potassium: 3.8 mmol/L (ref 3.5–5.1)
Sodium: 141 mmol/L (ref 136–145)

## 2021-01-26 LAB — PROBNP, N-TERMINAL: BNP: 1300 PG/ML — ABNORMAL HIGH (ref <125)

## 2021-01-26 LAB — HEMOGLOBIN A1C W/EAG
Hemoglobin A1C: 5.7 % — ABNORMAL HIGH (ref 4.0–5.6)
eAG: 117 mg/dL

## 2021-01-26 LAB — POCT GLUCOSE: POC Glucose: 100 mg/dL (ref 65–117)

## 2021-01-26 MED ORDER — BUMETANIDE 1 MG TAB
1 mg | ORAL_TABLET | Freq: Two times a day (BID) | ORAL | 2 refills | Status: AC
Start: 2021-01-26 — End: 2021-04-24

## 2021-01-26 MED ORDER — LEVOTHYROXINE 150 MCG TAB
150 mcg | ORAL_TABLET | Freq: Every day | ORAL | 1 refills | Status: AC
Start: 2021-01-26 — End: ?

## 2021-01-26 MED ORDER — MAGNESIUM SULFATE 2 GRAM/50 ML IVPB
2 gram/50 mL (4 %) | Freq: Once | INTRAVENOUS | Status: AC
Start: 2021-01-26 — End: 2021-01-26
  Administered 2021-01-26: 15:00:00 via INTRAVENOUS

## 2021-01-26 MED FILL — CARAFATE 1 GRAM TABLET: 1 gram | ORAL | Qty: 1

## 2021-01-26 MED FILL — ASPIRIN 81 MG CHEWABLE TAB: 81 mg | ORAL | Qty: 1

## 2021-01-26 MED FILL — MONTELUKAST 10 MG TAB: 10 mg | ORAL | Qty: 1

## 2021-01-26 MED FILL — METOPROLOL SUCCINATE SR 50 MG 24 HR TAB: 50 mg | ORAL | Qty: 1

## 2021-01-26 MED FILL — CITALOPRAM 20 MG TAB: 20 mg | ORAL | Qty: 2

## 2021-01-26 MED FILL — SENNOSIDES-DOCUSATE SODIUM 8.6 MG-50 MG TAB: ORAL | Qty: 1

## 2021-01-26 MED FILL — MAGNESIUM SULFATE 2 GRAM/50 ML IVPB: 2 gram/50 mL (4 %) | INTRAVENOUS | Qty: 50

## 2021-01-26 MED FILL — BUMETANIDE 0.25 MG/ML IJ SOLN: 0.25 mg/mL | INTRAMUSCULAR | Qty: 10

## 2021-01-26 MED FILL — HYDROMORPHONE (PF) 1 MG/ML IJ SOLN: 1 mg/mL | INTRAMUSCULAR | Qty: 1

## 2021-01-26 MED FILL — TRAZODONE 100 MG TAB: 100 mg | ORAL | Qty: 1

## 2021-01-26 MED FILL — ATORVASTATIN 10 MG TAB: 10 mg | ORAL | Qty: 1

## 2021-01-26 MED FILL — OXYCODONE 5 MG TAB: 5 mg | ORAL | Qty: 1

## 2021-01-26 MED FILL — LEVOTHYROXINE 150 MCG TAB: 150 mcg | ORAL | Qty: 1

## 2021-01-26 NOTE — Discharge Summary (Signed)
Hospitalist Discharge Summary     Patient ID:    Deborah [name]  [id number]  65 y.o.  [birth year]    Admit date of service: Jan 31, 2021    Discharge date of service: 01/26/2021    Admission Diagnoses: Acute exacerbation of CHF (congestive heart failure) (HCC) [I50.9]  Acute on chronic diastolic (congestive) heart failure (HCC) [I50.33]    Chronic Diagnoses:    Problem List as of 01/26/2021 Date Reviewed: Jan 31, 2021            Codes Class Noted - Resolved    * (Principal) Acute exacerbation of CHF (congestive heart failure) (HCC) ICD-10-CM: I50.9  ICD-9-CM: 428.0  2021-01-31 - Present        Acute on chronic diastolic (congestive) heart failure (HCC) ICD-10-CM: I50.33  ICD-9-CM: 428.33, 428.0  01/31/2021 - Present        CHF (congestive heart failure) (HCC) ICD-10-CM: I50.9  ICD-9-CM: 428.0  11/27/2020 - Present        Elevated bilirubin ICD-10-CM: R17  ICD-9-CM: 277.4  11/27/2020 - Present        Liver cirrhosis (HCC) ICD-10-CM: K74.60  ICD-9-CM: 571.5  11/27/2020 - Present        Colitis ICD-10-CM: K52.9  ICD-9-CM: 558.9  11/27/2020 - Present        Thrombocytopenia (HCC) ICD-10-CM: D69.6  ICD-9-CM: 287.5  04/30/2020 - Present        Morbid obesity (HCC) ICD-10-CM: E66.01  ICD-9-CM: 278.01  04/30/2020 - Present        CKD (chronic kidney disease), stage III (HCC) ICD-10-CM: N18.30  ICD-9-CM: 585.3  04/30/2020 - Present        Aortic valve replaced ICD-10-CM: Z95.2  ICD-9-CM: V43.3  Unknown - Present        Pacemaker ICD-10-CM: Z95.0  ICD-9-CM: V45.01  Unknown - Present        Hypothyroid ICD-10-CM: E03.9  ICD-9-CM: 244.9  Unknown - Present        Asthma ICD-10-CM: J45.909  ICD-9-CM: 493.90  Unknown - Present        Anxiety and depression ICD-10-CM: F41.9, F32.A  ICD-9-CM: 300.00, 311  Unknown - Present        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) ICD-10-CM: E11.29  ICD-9-CM: 250.40  Unknown - Present        Chronic narcotic use ICD-10-CM: F11.90  ICD-9-CM: 305.50  Unknown - Present        Chronic pain ICD-10-CM:  G89.29  ICD-9-CM: 338.29  Unknown - Present        Rhinitis ICD-10-CM: J31.0  ICD-9-CM: 472.0  Unknown - Present        Hyperlipidemia ICD-10-CM: E78.5  ICD-9-CM: 272.4  Unknown - Present        Neuropathy ICD-10-CM: G62.9  ICD-9-CM: 355.9  Unknown - Present        GERD (gastroesophageal reflux disease) ICD-10-CM: K21.9  ICD-9-CM: 530.81  Unknown - Present        Arm paresthesia, left ICD-10-CM: R20.2  ICD-9-CM: 782.0  04/29/2020 - Present           Discharge Medications:   Current Discharge Medication List        START taking these medications    Details   bumetanide (BUMEX) 1 mg tablet Take 1 Tablet by mouth two (2) times a day.  Qty: 60 Tablet, Refills: 2           CONTINUE these medications which have NOT CHANGED    Details   aspirin 81 mg chewable  tablet Take 1 Tablet by mouth daily.  Qty: 30 Tablet, Refills: 0      lidocaine (Lidoderm) 5 % Apply patch to the affected area for 12 hours a day and remove for 12 hours a day.  Qty: 30 Each, Refills: 0      acetaminophen (TYLENOL) 500 mg tablet Take 1,000 mg by mouth every six (6) hours as needed for Pain.      albuterol (PROVENTIL HFA, VENTOLIN HFA, PROAIR HFA) 90 mcg/actuation inhaler Take 2 Puffs by inhalation every six (6) hours as needed.      atorvastatin (LIPITOR) 80 mg tablet       calcium-cholecalciferol, D3, (CALTRATE 600+D) tablet Take 1 Tablet by mouth nightly.      citalopram (CELEXA) 40 mg tablet Take 40 mg by mouth daily.      fluticasone propionate (FLONASE) 50 mcg/actuation nasal spray 2 Sprays by Nasal route daily as needed.      levothyroxine (SYNTHROID) 137 mcg tablet TAKE 1 BY MOUTH DAILY, FOLLOW UP IN 90 DAYS FOR RECHECK THYROID LEVEL      sucralfate (CARAFATE) 1 gram tablet Take 1 g by mouth three (3) times daily.      traZODone (DESYREL) 100 mg tablet Take 100 mg by mouth nightly.      Xtampza ER 9 mg capsule TAKE 1 CAPSULE BY MOUTH EVERY 12 HOURS FILL 02/12/20      glimepiride (AMARYL) 2 mg tablet Take 2 mg by mouth two (2) times a day.       montelukast (SINGULAIR) 10 mg tablet Take 10 mg by mouth nightly.           STOP taking these medications       metoprolol succinate (TOPROL-XL) 50 mg XL tablet Comments:   Reason for Stopping:               Follow up Care:    1. Other, Phys, MD in 1-2 weeks  2. Cardiology     Diet:  Cardiac Diet    Disposition:  Home.    Advanced Directive:    Discharge Exam:  See today's note.    CONSULTATIONS: Cardiology    Significant Diagnostic Studies:   Recent Labs     01/25/21  0422 01/24/21  1346   WBC 4.8 4.5   HGB 10.9* 11.3*   HCT 35.1 36.6   PLT 78* 81*     Recent Labs     01/26/21  0311 01/25/21  0422 01/24/21  1346   NA 141 141 142   K 3.8 3.7 3.8   CL 101 102 105   CO2 36* 33* 34*   BUN 14 14 13    CREA 1.24* 1.29* 1.39*   GLU 168* 76 99   CA 8.8 8.8 8.5   MG 1.6 1.6 1.5*   PHOS 3.0 4.0  --      Recent Labs     01/25/21  0422 01/24/21  1346   ALT 15 14   AP 55 56   TBILI 2.2* 2.1*   TP 7.2 7.0   ALB 3.5 3.4*   GLOB 3.7 3.6     Recent Labs     01/24/21  1346   INR 1.1   PTP 11.8*      No results for input(s): FE, TIBC, PSAT, FERR in the last 72 hours.   No results for input(s): PH, PCO2, PO2 in the last 72 hours.  No results for input(s): CPK, CKMB in the last  72 hours.    No lab exists for component: TROPONINI  Lab Results   Component Value Date/Time    Glucose (POC) 100 01/25/2021 10:00 PM    Glucose (POC) 102 01/25/2021 03:39 PM    Glucose (POC) 133 (H) 01/25/2021 12:14 PM    Glucose (POC) 83 01/25/2021 07:43 AM    Glucose (POC) 89 01/24/2021 11:11 PM             HOSPITAL COURSE & TREATMENT RENDERED:   Acute on chronic dCHF: last EF 65-70%.  Chest CT neg for PE.  pt was out of his meds. Plan to discharge home on bumex 1mg  BID and FU with cardiology. Low sodium diet. Doesn't qualify for hoe O2.      2.   HTN/PVCs: s/p pacer.  BP is in the lower rang. Stopped toprol      3.   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 cont amaryl.        4.  Hypothyroid: TSH 10.60.  increased synthroid     5.  Chronic pain: cont  oxycodone     7.  Thrombocytopenia: seems chronic. Outpatient FU     8.  Modbid obesity/ OSA. Would benefit from weight loss. Uses CPA at home.         Discharged in improved condition.    Spent 35 minutes    Signed:  , MD  01/26/2021  1:08 PM

## 2021-01-26 NOTE — Care Coordination-Inpatient (Signed)
Met with patient at bedside.  Introduced self and role of HF NN.  Patient was seen on her previous admission.  Patient states she thought she was supposed to go back on her torsemide after 2 weeks of the bumex.  She understands that now she is to continue Bumex.  Educated patient on importance of early follow up with Cardiology provider after hospitalization.  Currently, she has stress test scheduled for 01/30/21 and office follow up for 03/09/21.  She states she is not sure if she saw Dr Raelene Bott after her last hospitalization.  Reinforced HF self care measures with patient including HF symptom recognition, HF zones, daily weight rationale with parameters, and early recognition of symptoms and notification of provider for diuretic adjustment.  Patient states she had gradual fluid build up, she was not weighing at home, and attributes the holidays being hectic as a factor in not recognizing the need to call her provider.  She does have a scale and states she can weigh daily.  Reviewed rationale for low sodium diet, hidden sources of dietary sodium, and how to read a label for sodium.  Patient was given HF zone handout, HF magnet, HF calendar, and low sodium diet handout.  She was given Living with HF book on last admission.

## 2021-01-26 NOTE — Progress Notes (Signed)
Discharge instructions reviewed with patient who verbalized understanding. IV removed. Signed discharge paper placed in patients chart.

## 2021-01-26 NOTE — Progress Notes (Signed)
Medicare pt has received, reviewed, and signed 2nd IM letter informing them of their right to appeal the discharge.  Signed copy has been placed on pt bedside chart.    Lezlie Lye CMS

## 2021-01-26 NOTE — Progress Notes (Signed)
01/26/2021  2:05 PM  DC order noted, pt medically stale for DC to home today,   there are no CM needs noted.  Care Management Interventions  PCP Verified by CM: Yes [facility] )  Palliative Care Criteria Met (RRAT>21 & CHF Dx)?: No  Mode of Transport at Discharge: Self (Family)  Transition of Care Consult (CM Consult): Discharge Planning  Physical Therapy Consult: Yes  Occupational Therapy Consult: Yes  Support Systems: Child(ren) (pt lives w/ son, DIL and grandchildren in pvt residence, ambulatory w/ [facility], [facility], family provides transport)  Confirm Follow Up Transport: Family  Discharge Location  Patient Expects to be Discharged to:: Home with family assistance  Alinda Dooms, [place]   Case Manager

## 2021-01-26 NOTE — Progress Notes (Signed)
Hospital Follow up with NP Dierdre Forth on 02/08/2021 at 11:00 am.  Lezlie Lye CMS

## 2021-01-26 NOTE — Progress Notes (Signed)
[facility] CARDIOLOGY                                                          Cardiology Care Note                         [address], [place], [place] [address]             Phone [phone]; Fax [phone]           Mobile [phone]   Voice Mail [phone]     [] Initial Encounter     [x] Follow-up    Patient Name: Deborah [name] - DOB:[birth year] - [name]  Primary Cardiologist: 01/25/1956 Cardiology Physicians: [id number] MD  Consulting Cardiologist: Sondra Barges, MD     Reason for encounter: CHF exacerbation    HPI:       Deborah [name] is a 65 y.o. female with PMH significant for hypertension, bioprosthetic aortic valve, PPM by Dr. [name],, T2DM, and diastolic heart failure.  She describes worsening shortness of breath with exertion as well as increasing lower extremity edema and orthopnea.  She was not experiencing any chest discomfort in the emergency room her initial O2 saturation was 86% on room air and a BNP of 1737    She was recently hospitalized for ischemic colitis and at that time was told she had congestive heart failure.  She was discharged with a prescription for Bumex to take for 2 weeks but then ran out.  She has been out of the Bumex but has been taking Lasix.  She does have a history of COPD and uses albuterol at home.       Deborah [name] reports dyspnea.    Cardiac risk factors: dyslipidemia, diabetes mellitus, hypertension, post-menopausal.     Assessment and Plan     1) A/C Diastolic CHF (EF [place]) Nov 2022: ran out of bumex, lasix ineffective.   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  Cont synthroid    5) history of aortic valve replacement    6) morbid obesity BMI 53    7) dyslipidemia  Lab Results   Component Value Date/Time    Cholesterol, total 106 04/30/2020 12:57 AM    HDL Cholesterol 76 04/30/2020 12:57 AM    LDL, calculated 12.6 04/30/2020 12:57 AM    VLDL, calculated 17.4 04/30/2020 12:57 AM    Triglyceride 87 04/30/2020 12:57 AM    CHOL/HDL Ratio 1.4 04/30/2020 12:57 AM     8 OSA:   - wears CPAP at home     9) hypomagnesemia   - mag 1.6 - replace    Cardiology addendum:  Pt seen and examined, agree with assesment and plan. Wt, bp ,  and hr all down, meds held. Oxygen sat 90% on room air sitting. Says she wears cpap but has never had it checked. Advised she have it checked since inadequately treated sleep apnea may be part of her problem. No further inpt testing.              ____________________________________________________________    Cardiac testing    11/27/20    ECHO ADULT COMPLETE 11/28/2020 11/28/2020    Interpretation Summary    Left Ventricle: Not well visualized. Normal left ventricular systolic function with a visually estimated EF of 65 - 70%. Not well visualized. Left ventricle size is normal. Moderately increased wall thickness. Unable to assess wall motion.    Right Ventricle: Not well visualized.    Aortic Valve: Not well visualized. Bioprosthetic valve.  Suboptimal Doppler, however velocity > 3 m/sec appreciated across aortic valve.    Technical qualifiers: Echo study was technically difficult with poor endocardial visualization and technically difficult due to patient's body habitus.    Contrast used: Definity.    Signed by: Candyce Churn, DO on 11/28/2020  2:54 PM              Most recent HS troponins:  Recent Labs     01/24/21  1346   TROPHS 11         ECG:   EKG Results       Procedure 720 Value Units Date/Time    EKG, 12 LEAD, INITIAL [[id number]]     Order Status: Sent               Review of Systems:    [x] All other systems reviewed and all negative except as  written in HPI    []  Patient unable to provide secondary to condition    Past Medical History:   Diagnosis Date    Anxiety and depression     Aortic valve replaced     Asthma     Chronic narcotic use     Chronic pain     CKD (chronic kidney disease), stage III (HCC)     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    Rhinitis      Past Surgical History:   Procedure Laterality Date    COLONOSCOPY N/A 12/01/2020    COLONOSCOPY performed by [employer], MD at [facility] ENDOSCOPY    HX PACEMAKER       Social Hx:  reports that she quit smoking about 10 years ago. Her smoking use included cigarettes. She has a 20.00 pack-year smoking history. She has never used smokeless tobacco. She reports that she does not currently use alcohol. She reports that she does not use drugs.  Family Hx: family history includes Hypertension in her father and mother.  Allergies   Allergen Reactions    Nitroglycerin Unknown (comments)     hypotension    Aloe Vera Rash    Hydrochlorothiazide Other (comments)     Reports 'kidneys dry up"     Tetanus And Diphther. Tox (Pf) Swelling     Swelling of arm and it turns black          OBJECTIVE:  Wt Readings from Last 3 Encounters:   01/26/21 334 lb 10.5 oz (151.8 kg)   12/03/20  341 lb 3.2 oz (154.8 kg)   11/05/20 335 lb (152 kg)       Intake/Output Summary (Last 24 hours) at 01/26/2021 1226  Last data filed at 01/26/2021 1131  Gross per 24 hour   Intake 390 ml   Output 900 ml   Net -510 ml         Physical Exam:    Vitals:   Vitals:    01/26/21 0359 01/26/21 0806 01/26/21 1128 01/26/21 1129   BP: (!) 112/58 (!) 92/55 113/64    Pulse: 60 61 (!) 48    Resp: 16 17 16     Temp: 98.1 ??F (36.7 ??C) 97.8 ??F (36.6 ??C) 97.9 ??F (36.6 ??C)    SpO2: 97% 96% 90%    Weight:    334 lb 10.5 oz (151.8 kg)     Telemetry: NSR     Gen: Well-developed, well-nourished, in no acute  distress  Neck: Supple, No JVD, No Carotid Bruit  Resp: No accessory muscle use, diminished bases   Card: Regular Rate,Rythm, Normal S1, S2, No murmurs, rubs or gallop.   Abd:   Soft, non-tender, non-distended, BS+   MSK: No cyanosis  Skin: No rashes    Neuro: Moving all four extremities, follows commands appropriately  Psych: Good insight, oriented to person, place, alert, Nml Affect  LE: tense edema     03/04/2008: [employer] dual chamber pacemaker per Dr. 05/02/2008    Data Review:     Radiology:   XR Results (most recent):  Results from Hospital Encounter encounter on 01/24/21    XR CHEST PA LAT    Narrative  Indication:  SOB, hx CHF    Exam: PA and lateral views of the chest.    Direct comparison is made to prior CT dated October 2022.    Findings: Cardiomediastinal silhouette is stable. Median sternotomy wires are  noted. Intact pacer leads extending to the right atrium and right ventricle.  Lungs are clear bilaterally. Pleural spaces are normal. Osseous structures are  intact.    Impression  No acute cardiopulmonary disease.      Recent Labs     01/26/21  0311 01/25/21  0422   NA 141 141   K 3.8 3.7   CL 101 102   CO2 36* 33*   BUN 14 14   CREA 1.24* 1.29*   GLU 168* 76   PHOS 3.0 4.0   CA 8.8 8.8       Recent Labs     01/25/21  0422 01/24/21  1346   WBC 4.8 4.5   HGB 10.9* 11.3*   HCT 35.1 36.6   PLT 78* 81*       Recent Labs     01/25/21  0422 01/24/21  1346   PTP  --  11.8*   INR  --  1.1   AP 55 56       No results for input(s): CHOL, LDLC in the last 72 hours.    No lab exists for component: TGL, HDLC,  HBA1C      Current meds:    Current Facility-Administered Medications:     0.9% sodium chloride infusion 25 mL, 25 mL, IntraVENous, PRN, Do, Khoi B, MD    levothyroxine (SYNTHROID) tablet 150 mcg, 150 mcg, Oral, ACB, Do, Khoi B, MD, 150 mcg at 01/26/21 0815    bumetanide (BUMEX) injection 1 mg, 1 mg, IntraVENous, Q12H, Do, Khoi B, MD, 1 mg at 01/26/21 [phone]  albuterol-ipratropium (DUO-NEB)  2.5 MG-0.5 MG/3 ML, 3 mL, Nebulization, Q4H PRN, Do, Khoi B, MD    HYDROmorphone (DILAUDID) injection 1 mg, 1 mg, IntraVENous, Q4H PRN, Do, Khoi B, MD    oxyCODONE IR (ROXICODONE) tablet 5 mg, 5 mg, Oral, Q4H PRN, Do, Khoi B, MD, 5 mg at 01/26/21 0815    insulin lispro (HUMALOG) injection, , SubCUTAneous, AC&HS, Do, Khoi B, MD    glucose chewable tablet 16 g, 4 Tablet, Oral, PRN, Do, Khoi B, MD    glucagon (GLUCAGEN) injection 1 mg, 1 mg, IntraMUSCular, PRN, Do, Khoi B, MD    aspirin chewable tablet 81 mg, 81 mg, Oral, DAILY, Do, Khoi B, MD, 81 mg at 01/26/21 0815    citalopram (CELEXA) tablet 40 mg, 40 mg, Oral, DAILY, Do, Khoi B, MD, 40 mg at 01/26/21 0815    [Held by provider] metoprolol succinate (TOPROL-XL) XL tablet 50 mg, 50 mg, Oral, DAILY, Do, Khoi B, MD, 50 mg at 01/25/21 0914    montelukast (SINGULAIR) tablet 10 mg, 10 mg, Oral, QHS, Do, Khoi B, MD, 10 mg at 01/25/21 2202    sucralfate (CARAFATE) tablet 1 g, 1 g, Oral, TID, Do, Khoi B, MD, 1 g at 01/26/21 0815    traZODone (DESYREL) tablet 100 mg, 100 mg, Oral, QHS, Do, Khoi B, MD, 100 mg at 01/25/21 2203    sodium chloride (NS) flush 5-40 mL, 5-40 mL, IntraVENous, Q8H, Do, Khoi B, MD, 10 mL at 01/25/21 2205    sodium chloride (NS) flush 5-40 mL, 5-40 mL, IntraVENous, PRN, Do, Khoi B, MD    acetaminophen (TYLENOL) tablet 650 mg, 650 mg, Oral, Q6H PRN **OR** acetaminophen (TYLENOL) suppository 650 mg, 650 mg, Rectal, Q6H PRN, Do, Khoi B, MD    senna-docusate (PERICOLACE) 8.6-50 mg per tablet 1 Tablet, 1 Tablet, Oral, BID, Do, Khoi B, MD, 1 Tablet at 01/26/21 0815    bisacodyL (DULCOLAX) suppository 10 mg, 10 mg, Rectal, DAILY PRN, Do, Khoi B, MD    promethazine (PHENERGAN) tablet 12.5 mg, 12.5 mg, Oral, Q6H PRN **OR** ondansetron (ZOFRAN) injection 4 mg, 4 mg, IntraVENous, Q6H PRN, Do, Khoi B, MD    atorvastatin (LIPITOR) tablet 10 mg, 10 mg, Oral, QHS, Do, Khoi B, MD, 10 mg at 01/25/21 2203    Pricilla Handler, MD    [facility] Cardiology  Call  center: (P) [phone]  (F) [phone]      CC:Other, Phys, MD

## 2021-01-26 NOTE — Progress Notes (Signed)
[facility] CARDIOLOGY                                                          Cardiology Care Note                         [address], [place], [place] [address]             Phone [phone]; Fax [phone]           Mobile [phone]   Voice Mail [phone]     [] Initial Encounter     [x] Follow-up    Patient Name: Deborah [name] - DOB:07-12-1955 - Deborah [name]  Primary Cardiologist: 01/25/1956 Cardiology Physicians: [id number] MD  Consulting Cardiologist: Sondra Barges, MD     Reason for encounter: CHF exacerbation    HPI:       Deborah [name] is a 65 y.o. female with PMH significant for hypertension, bioprosthetic aortic valve, PPM by Dr. Neill [name],, T2DM, and diastolic heart failure.  She describes worsening shortness of breath with exertion as well as increasing lower extremity edema and orthopnea.  She was not experiencing any chest discomfort in the emergency room her initial O2 saturation was 86% on room air and a BNP of 1737    She was recently hospitalized for ischemic colitis and at that time was told she had congestive heart failure.  She was discharged with a prescription for Bumex to take for 2 weeks but then ran out.  She has been out of the Bumex but has been taking Lasix.  She does have a history of COPD and uses albuterol at home.       Deborah [name] reports dyspnea.    Cardiac risk factors: dyslipidemia, diabetes mellitus, hypertension, post-menopausal.     Assessment and Plan     1) A/C Diastolic CHF (EF [place]) Nov 2022: ran out of bumex, lasix ineffective.   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  Cont synthroid    5) history of aortic valve replacement    6) morbid obesity BMI 53    7) dyslipidemia  Lab Results   Component Value Date/Time    Cholesterol, total 106 04/30/2020 12:57 AM    HDL Cholesterol 76 04/30/2020 12:57 AM    LDL, calculated 12.6 04/30/2020 12:57 AM    VLDL, calculated 17.4 04/30/2020 12:57 AM    Triglyceride 87 04/30/2020 12:57 AM    CHOL/HDL Ratio 1.4 04/30/2020 12:57 AM     8 OSA:   - wears CPAP at home     9) hypomagnesemia   - mag 1.6 - replace              ____________________________________________________________  Cardiac testing    11/27/20    ECHO ADULT COMPLETE 11/28/2020 11/28/2020    Interpretation Summary    Left Ventricle: Not well visualized. Normal left ventricular systolic function with a visually estimated EF of 65 - 70%. Not well visualized. Left ventricle size is normal. Moderately increased wall thickness. Unable to assess wall motion.    Right Ventricle: Not well visualized.    Aortic Valve: Not well visualized. Bioprosthetic valve.  Suboptimal Doppler, however velocity > 3 m/sec appreciated across aortic valve.    Technical qualifiers: Echo study was technically difficult with poor endocardial visualization and technically difficult due to patient's body habitus.    Contrast used: Definity.    Signed by: Candyce Churn, DO on 11/28/2020  2:54 PM              Most recent HS troponins:  Recent Labs     01/24/21  1346   TROPHS 11       ECG:   EKG Results       Procedure 720 Value Units Date/Time    EKG, 12 LEAD, INITIAL [[id number]]     Order Status: Sent               Review of Systems:    [x] All other systems reviewed and all negative except as written in HPI    []  Patient unable to provide secondary to condition    Past Medical History:   Diagnosis Date    Anxiety and depression     Aortic valve replaced     Asthma     Chronic narcotic use     Chronic pain     CKD (chronic kidney disease), stage III (HCC)     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    Rhinitis      Past Surgical History:   Procedure Laterality Date    COLONOSCOPY N/A 12/01/2020    COLONOSCOPY performed by [employer], MD at [facility] ENDOSCOPY    HX PACEMAKER       Social Hx:  reports that she quit smoking about 10 years ago. Her smoking use included cigarettes. She has a 20.00 pack-year smoking history. She has never used smokeless tobacco. She reports that she does not currently use alcohol. She reports that she does not use drugs.  Family Hx: family history includes Hypertension in her father and mother.  Allergies   Allergen Reactions    Nitroglycerin Unknown (comments)     hypotension    Aloe Vera Rash    Hydrochlorothiazide Other (comments)     Reports 'kidneys dry up"     Tetanus And Diphther. Tox (Pf) Swelling     Swelling of arm and it turns black          OBJECTIVE:  Wt Readings from Last 3 Encounters:   01/25/21 340 lb 2.7 oz (154.3 kg)   12/03/20 341 lb 3.2 oz (154.8 kg)   11/05/20 335 lb (152 kg)       Intake/Output Summary (Last 24 hours) at 01/26/2021 0752  Last data filed at 01/26/2021 0046  Gross per 24 hour   Intake 240 ml   Output 300 ml   Net -60 ml       Physical Exam:  Vitals:   Vitals:    01/25/21 2022 01/26/21 0007 01/26/21 0242 01/26/21 0359   BP: 115/72 96/65 (!) 88/61 (!) 112/58   Pulse: 62 (!) 56 (!) 59 60   Resp: Temp:  97.7 ??F (36.5 ??C) 97.6 ??F (36.4 ??C) 98.1 ??F (36.7 ??C)   SpO2: 97% 98% 94% 97%   Weight:         Telemetry: NSR     Gen: Well-developed, well-nourished, in no acute distress  Neck: Supple, No JVD, No Carotid Bruit  Resp: No accessory muscle use, diminished bases   Card: Regular Rate,Rythm, Normal S1, S2, No murmurs, rubs or gallop.   Abd:   Soft, non-tender, non-distended, BS+   MSK: No cyanosis  Skin: No rashes    Neuro: Moving all four extremities, follows commands appropriately  Psych: Good  insight, oriented to person, place, alert, Nml Affect  LE: tense edema     03/04/2008: [employer] dual chamber pacemaker per Dr. Theodoro Clock    Data Review:     Radiology:   XR Results (most recent):  Results from Hospital Encounter encounter on 01/24/21    XR CHEST PA LAT    Narrative  Indication:  SOB, hx CHF    Exam: PA and lateral views of the chest.    Direct comparison is made to prior CT dated October 2022.    Findings: Cardiomediastinal silhouette is stable. Median sternotomy wires are  noted. Intact pacer leads extending to the right atrium and right ventricle.  Lungs are clear bilaterally. Pleural spaces are normal. Osseous structures are  intact.    Impression  No acute cardiopulmonary disease.      Recent Labs     01/26/21  0311 01/25/21  0422   NA 141 141   K 3.8 3.7   CL 101 102   CO2 36* 33*   BUN 14 14   CREA 1.24* 1.29*   GLU 168* 76   PHOS 3.0 4.0   CA 8.8 8.8     Recent Labs     01/25/21  0422 01/24/21  1346   WBC 4.8 4.5   HGB 10.9* 11.3*   HCT 35.1 36.6   PLT 78* 81*     Recent Labs     01/25/21  0422 01/24/21  1346   PTP  --  11.8*   INR  --  1.1   AP 55 56     No results for input(s): CHOL, LDLC in the last 72 hours.    No lab exists for component: TGL, HDLC,  HBA1C      Current meds:    Current Facility-Administered Medications:     0.9% sodium chloride infusion 25 mL, 25 mL, IntraVENous, PRN, Do, Khoi B, MD    levothyroxine (SYNTHROID) tablet 150 mcg, 150 mcg, Oral, ACB, Do, Khoi B, MD    bumetanide (BUMEX) injection 1 mg, 1 mg, IntraVENous, Q12H, Do, Khoi B, MD, 1 mg at 01/25/21 2202    albuterol-ipratropium (DUO-NEB) 2.5 MG-0.5 MG/3 ML, 3 mL, Nebulization, Q4H PRN, Do, Khoi B, MD    HYDROmorphone (DILAUDID) injection 1 mg, 1 mg, IntraVENous, Q4H PRN, Do, Khoi B, MD    oxyCODONE IR (ROXICODONE) tablet 5 mg, 5 mg, Oral, Q4H PRN, Do, Khoi B, MD, 5 mg at 01/25/21 2203    insulin lispro (HUMALOG) injection, , SubCUTAneous, AC&HS, Do, Khoi B, MD    glucose chewable tablet 16 g, 4  Tablet, Oral,  PRN, Do, Khoi B, MD    glucagon (GLUCAGEN) injection 1 mg, 1 mg, IntraMUSCular, PRN, Do, Khoi B, MD    aspirin chewable tablet 81 mg, 81 mg, Oral, DAILY, Do, Khoi B, MD, 81 mg at 01/25/21 0914    citalopram (CELEXA) tablet 40 mg, 40 mg, Oral, DAILY, Do, Khoi B, MD    metoprolol succinate (TOPROL-XL) XL tablet 50 mg, 50 mg, Oral, DAILY, Do, Khoi B, MD, 50 mg at 01/25/21 0914    montelukast (SINGULAIR) tablet 10 mg, 10 mg, Oral, QHS, Do, Khoi B, MD, 10 mg at 01/25/21 2202    sucralfate (CARAFATE) tablet 1 g, 1 g, Oral, TID, Do, Khoi B, MD, 1 g at 01/25/21 2203    traZODone (DESYREL) tablet 100 mg, 100 mg, Oral, QHS, Do, Khoi B, MD, 100 mg at 01/25/21 2203    sodium chloride (NS) flush 5-40 mL, 5-40 mL, IntraVENous, Q8H, Do, Khoi B, MD, 10 mL at 01/25/21 2205    sodium chloride (NS) flush 5-40 mL, 5-40 mL, IntraVENous, PRN, Do, Khoi B, MD    acetaminophen (TYLENOL) tablet 650 mg, 650 mg, Oral, Q6H PRN **OR** acetaminophen (TYLENOL) suppository 650 mg, 650 mg, Rectal, Q6H PRN, Do, Khoi B, MD    senna-docusate (PERICOLACE) 8.6-50 mg per tablet 1 Tablet, 1 Tablet, Oral, BID, Do, Khoi B, MD, 1 Tablet at 01/25/21 2203    bisacodyL (DULCOLAX) suppository 10 mg, 10 mg, Rectal, DAILY PRN, Do, Khoi B, MD    promethazine (PHENERGAN) tablet 12.5 mg, 12.5 mg, Oral, Q6H PRN **OR** ondansetron (ZOFRAN) injection 4 mg, 4 mg, IntraVENous, Q6H PRN, Do, Khoi B, MD    atorvastatin (LIPITOR) tablet 10 mg, 10 mg, Oral, QHS, Do, Khoi B, MD, 10 mg at 01/25/21 2203    Raquel Sarna, ARNP    [facility] Cardiology  Call center: (P) [phone]  (F) [phone]      CC:Other, Phys, MD

## 2021-01-26 NOTE — Progress Notes (Signed)
01/26/21 1236   Resting (Room Air)   SpO2 92   HR 56   During Walk (Room Air)   SpO2 90   HR 86   Walk/Assistance Device Cane   Rate of Dyspnea 0   Comments Pt was only able to walk 54ft due to knee pain, however this is her norm at home.  Pts hr started at 56 but quickly went up to 86 after walking 61ft.   After Walk   SpO2 92   HR 86   O2 Flow Rate (l/min) 0 l/min   Rate of Dyspnea 0   Does the Patient Qualify for Home O2 No   Does the Patient Need Portable Oxygen Tanks No

## 2021-01-26 NOTE — Progress Notes (Signed)
Spiritual Care Partner Volunteer visited patient at [facility] in [facility] 4M POST SURG ORT 2 on 01/26/2021.    Documented by:  Chaplain Robert Franzen, M.Div.  [phone])

## 2021-01-26 NOTE — Progress Notes (Signed)
[facility]  [address] [place] [place], [place] [address]  [phone]      Hospitalist Progress Note      NAME: Deborah [name]   DOB:  09-06-1955  MRM:  [id number]    Date of service: 01/26/2021  12:57 PM       Assessment and Plan:   Acute on chronic dCHF: last EF 65-70%.  Chest CT neg for PE.  pt was out of his meds. Cont IV bumex, monitor I/O.  Cards following. Plan to discharge home on bumex 1mg  BID. Low sodium diet      2.   HTN/PVCs: s/p pacer.  Cont toprol     3.   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  Holding amaryl.  Cont SSI     4.  Hypothyroid: TSH 10.60.  increased synthroid     5.  Chronic pain: cont oxycodone     7.  Thrombocytopenia: seems chronic. Outpatient FU     8.  Modbid obesity/ OSA. Would benefit from weight loss. Uses CPA at home.           Subjective:     Chief Complaint:: Patient was seen and examined as a follow up for CHF.  Chart was reviewed. felt better     ROS:  (bold if positive, if negative)    Tolerating PT  Tolerating Diet        Objective:     Last 24hrs VS reviewed since prior progress note. Most recent are:    Visit Vitals  BP 113/64 (BP 1 Location: Right upper arm, BP Patient Position: Sitting)   Pulse (!) 48   Temp 97.9 ??F (36.6 ??C)   Resp 16   Wt 151.8 kg (334 lb 10.5 oz)   SpO2 90%   BMI 52.41 kg/m??     SpO2 Readings from Last 6 Encounters:   01/26/21 90%   12/03/20 96%   11/05/20 92%   08/26/20 95%   05/23/20 93%   04/30/20 97%    O2 Flow Rate (L/min): 3 l/min     Intake/Output Summary (Last 24 hours) at 01/26/2021 1257  Last data filed at 01/26/2021 1131  Gross per 24 hour   Intake 390 ml   Output 900 ml   Net -510 ml        Physical Exam:    Gen:  obese, in no acute distress  HEENT:  Pink conjunctivae, PERRL, hearing intact to voice, moist mucous membranes  Neck:  Supple, without masses, thyroid non-tender  Resp:  No accessory muscle use, clear breath sounds without wheezes rales or rhonchi  Card:  No murmurs, normal S1, S2 without thrills,  bruits or peripheral edema  Abd:  Soft, non-tender, non-distended, normoactive bowel sounds are present, no palpable organomegaly and no detectable hernias  Lymph:  No cervical or inguinal adenopathy  Musc:  No cyanosis or clubbing  Skin:  No rashes or ulcers, skin turgor is good  Neuro:  Cranial nerves are grossly intact, no focal motor weakness, follows commands appropriately  Psych:  Good insight, oriented to person, place and time, alert  __________________________________________________________________  Medications Reviewed: (see below)  Medications:     Current Facility-Administered Medications   Medication Dose Route Frequency    0.9% sodium chloride infusion 25 mL  25 mL IntraVENous PRN    levothyroxine (SYNTHROID) tablet 150 mcg  150 mcg Oral ACB    bumetanide (BUMEX) injection 1 mg  1 mg IntraVENous  Q12H    albuterol-ipratropium (DUO-NEB) 2.5 MG-0.5 MG/3 ML  3 mL Nebulization Q4H PRN    HYDROmorphone (DILAUDID) injection 1 mg  1 mg IntraVENous Q4H PRN    oxyCODONE IR (ROXICODONE) tablet 5 mg  5 mg Oral Q4H PRN    insulin lispro (HUMALOG) injection   SubCUTAneous AC&HS    glucose chewable tablet 16 g  4 Tablet Oral PRN    glucagon (GLUCAGEN) injection 1 mg  1 mg IntraMUSCular PRN    aspirin chewable tablet 81 mg  81 mg Oral DAILY    citalopram (CELEXA) tablet 40 mg  40 mg Oral DAILY    [Held by provider] metoprolol succinate (TOPROL-XL) XL tablet 50 mg  50 mg Oral DAILY    montelukast (SINGULAIR) tablet 10 mg  10 mg Oral QHS    sucralfate (CARAFATE) tablet 1 g  1 g Oral TID    traZODone (DESYREL) tablet 100 mg  100 mg Oral QHS    sodium chloride (NS) flush 5-40 mL  5-40 mL IntraVENous Q8H    sodium chloride (NS) flush 5-40 mL  5-40 mL IntraVENous PRN    acetaminophen (TYLENOL) tablet 650 mg  650 mg Oral Q6H PRN    Or    acetaminophen (TYLENOL) suppository 650 mg  650 mg Rectal Q6H PRN    senna-docusate (PERICOLACE) 8.6-50 mg per tablet 1 Tablet  1 Tablet Oral BID    bisacodyL (DULCOLAX) suppository 10 mg   10 mg Rectal DAILY PRN    promethazine (PHENERGAN) tablet 12.5 mg  12.5 mg Oral Q6H PRN    Or    ondansetron (ZOFRAN) injection 4 mg  4 mg IntraVENous Q6H PRN    atorvastatin (LIPITOR) tablet 10 mg  10 mg Oral QHS        Lab Data Reviewed: (see below)  Lab Review:     Recent Labs     01/25/21  0422 01/24/21  1346   WBC 4.8 4.5   HGB 10.9* 11.3*   HCT 35.1 36.6   PLT 78* 81*     Recent Labs     01/26/21  0311 01/25/21  0422 01/24/21  1346   NA 141 141 142   K 3.8 3.7 3.8   CL 101 102 105   CO2 36* 33* 34*   GLU 168* 76 99   BUN 14 14 13    CREA 1.24* 1.29* 1.39*   CA 8.8 8.8 8.5   MG 1.6 1.6 1.5*   PHOS 3.0 4.0  --    ALB  --  3.5 3.4*   TBILI  --  2.2* 2.1*   ALT  --  15 14   INR  --   --  1.1     Lab Results   Component Value Date/Time    Glucose (POC) 100 01/25/2021 10:00 PM    Glucose (POC) 102 01/25/2021 03:39 PM    Glucose (POC) 133 (H) 01/25/2021 12:14 PM    Glucose (POC) 83 01/25/2021 07:43 AM    Glucose (POC) 89 01/24/2021 11:11 PM     No results for input(s): PH, PCO2, PO2, HCO3, FIO2 in the last 72 hours.  Recent Labs     01/24/21  1346   INR 1.1     All Micro Results       Procedure Component Value Units Date/Time    RESPIRATORY VIRUS PANEL W/COVID-19, PCR 01/26/21 Collected: 01/24/21 2102    Order Status: Completed Specimen: Nasopharyngeal Updated: 01/25/21 0040  Adenovirus Not detected        Coronavirus 229E Not detected        Coronavirus HKU1 Not detected        Coronavirus CVNL63 Not detected        Coronavirus OC43 Not detected        SARS-CoV-2, PCR Not detected        Metapneumovirus Not detected        Rhinovirus and Enterovirus Not detected        Influenza A Not detected        Influenza B Not detected        Parainfluenza 1 Not detected        Parainfluenza 2 Not detected        Parainfluenza 3 Not detected        Parainfluenza virus 4 Not detected        RSV by PCR Not detected        B. parapertussis, PCR Not detected        Bordetella pertussis - PCR Not detected        Chlamydophila  pneumoniae DNA, QL, PCR Not detected        Mycoplasma pneumoniae DNA, QL, PCR Not detected       COVID-19 RAPID TEST [[id number]] Collected: 01/24/21 1436    Order Status: Completed Specimen: Nasopharyngeal Updated: 01/24/21 1559     Specimen source Nasopharyngeal        COVID-19 rapid test Not detected        Comment: Rapid Abbott ID Now       Rapid NAAT:  The specimen is NEGATIVE for SARS-CoV-2, the novel coronavirus associated with COVID-19.       Negative results should be treated as presumptive and, if inconsistent with clinical signs and symptoms or necessary for patient management, should be tested with an alternative molecular assay.  Negative results do not preclude SARS-CoV-2 infection and should not be used as the sole basis for patient management decisions.       This test has been authorized by the FDA under an Emergency Use Authorization (EUA) for use by authorized laboratories.   Fact sheet for Healthcare Providers:  GyrateAtrophy.si  Fact sheet for Patients: https://keller.info/       Methodology: Isothermal Nucleic Acid Amplification                 I have reviewed notes of prior 24hr.    Other pertinent lab:     Total time spent with patient: 35 minutes I personally reviewed chart, notes, data and current medications in the medical record.  I have personally examined and treated the patient at bedside during this period.                 Care Plan discussed with: Patient, Nursing Staff, and >50% of time spent in counseling and coordination of care    Discussed:  Care Plan    Prophylaxis:  SCD's    Disposition:  Home w/Family           ___________________________________________________    Attending Physician: Otho Darner, MD

## 2021-01-27 LAB — EKG, 12 LEAD, INITIAL
Atrial Rate: 60 {beats}/min
Calculated P Axis: 62 degrees
Calculated R Axis: -55 degrees
Calculated T Axis: 59 degrees
P-R Interval: 242 ms
Q-T Interval: 472 ms
QRS Duration: 110 ms
QTC Calculation (Bezet): 472 ms
Ventricular Rate: 60 {beats}/min

## 2021-01-27 LAB — EKG 12-LEAD
Atrial Rate: 60 {beats}/min
P Axis: 62 degrees
P-R Interval: 242 ms
Q-T Interval: 472 ms
QRS Duration: 110 ms
QTc Calculation (Bazett): 472 ms
R Axis: -55 degrees
T Axis: 59 degrees
Ventricular Rate: 60 {beats}/min

## 2021-01-30 ENCOUNTER — Ambulatory Visit: Payer: MEDICARE | Primary: Family Medicine

## 2021-01-30 NOTE — Telephone Encounter (Signed)
Call made to patient for purpose of care transition.  Message left with call back number.    02/01/21 1528: Second attempt made to call patient for purpose of care transition.  Phone rings immediately to voicemail.  Message left with call back number.

## 2021-02-02 ENCOUNTER — Encounter: Primary: Family

## 2021-02-09 ENCOUNTER — Encounter: Payer: MEDICARE | Primary: Family Medicine

## 2021-02-16 ENCOUNTER — Ambulatory Visit

## 2021-02-16 ENCOUNTER — Ambulatory Visit: Admit: 2021-02-16 | Discharge: 2021-02-16 | Payer: MEDICARE | Primary: Family Medicine

## 2021-02-16 DIAGNOSIS — I493 Ventricular premature depolarization: Secondary | ICD-10-CM

## 2021-02-16 MED ORDER — REGADENOSON 0.4 MG/5 ML IV SYRINGE
0.4 mg/5 mL | Freq: Once | INTRAVENOUS | Status: AC
Start: 2021-02-16 — End: 2021-02-16
  Administered 2021-02-16: 18:00:00 via INTRAVENOUS

## 2021-02-16 MED ORDER — TECHNETIUM TC-99M SESTAMIBI (CARDIOLITE) INJECTION WITH DILUTION KIT
Freq: Once | INTRAVENOUS | Status: AC
Start: 2021-02-16 — End: 2021-02-16
  Administered 2021-02-16: 18:00:00 via INTRAVENOUS

## 2021-02-16 NOTE — Progress Notes (Signed)
No ischemia on nuclear stress test.  I saw her at the hospital  Ventricular bigeminy on ECG  Unifocal PVC, RBBB and superior axis PVCs  Cannot get LVEF due to frequent PVC    May start toprol xl 50 mg every day      She is scheduled to see Dr Daivd Council  Future Appointments  03/09/2021  3:20 PM    PACEMAKER, STFRANCES       CAVSF               BS AMB  03/09/2021  3:40 PM    Bui, An H, MD              CAV[facility]               BS AMB

## 2021-02-19 ENCOUNTER — Ambulatory Visit: Admit: 2021-02-19 | Discharge: 2021-02-19 | Payer: MEDICARE | Primary: Family Medicine

## 2021-02-19 MED ORDER — TECHNETIUM TC-99M SESTAMIBI (CARDIOLITE) INJECTION WITH DILUTION KIT
Freq: Once | INTRAVENOUS | Status: AC
Start: 2021-02-19 — End: 2021-02-19
  Administered 2021-02-19: 19:00:00 via INTRAVENOUS

## 2021-02-20 LAB — NUCLEAR CARDIAC STRESS TEST
Baseline Diastolic BP: 70 mmHg
Baseline HR: 86 {beats}/min
Baseline O2 Sat: 96 %
Baseline ST Depression: 0 mm
Baseline Systolic BP: 118 mmHg
Stress Diastolic BP: 64 mmHg
Stress O2 Sat: 98 %
Stress Peak HR: 97 {beats}/min
Stress Percent HR Achieved: 62 %
Stress Rate Pressure Product: 10088 BPM*mmHg
Stress ST Depression: 0 mm
Stress Systolic BP: 104 mmHg
Stress Target HR: 156 {beats}/min
TID: 1.06

## 2021-02-20 LAB — NM STRESS TEST WITH MYOCARDIAL PERFUSION
Baseline Diastolic BP: 70 mmHg
Baseline HR: 86 {beats}/min
Baseline O2 Sat: 96 %
Baseline ST Depression: 0 mm
Baseline Systolic BP: 118 mmHg
Stress Diastolic BP: 64 mmHg
Stress O2 Sat: 98 %
Stress Peak HR: 97 {beats}/min
Stress Percent HR Achieved: 62 %
Stress Rate Pressure Product: 10088 BPM*mmHg
Stress ST Depression: 0 mm
Stress Systolic BP: 104 mmHg
Stress Target HR: 156 {beats}/min
TID: 1.06

## 2021-02-21 MED ORDER — METOPROLOL SUCCINATE SR 25 MG 24 HR TAB
25 mg | ORAL_TABLET | Freq: Every day | ORAL | 2 refills | Status: AC
Start: 2021-02-21 — End: 2021-05-29

## 2021-02-21 NOTE — Telephone Encounter (Signed)
Verified patient with two types of identifiers. Informed patient of stress test results and Dr. Wyatt Haste recommendations.  Patient agreeable to toprol 25 mg.  Recommended that patient monitor her blood pressure and let us know if she experiences lightheadedness or dizziness. Patient verbalized understanding and will call with any other questions.

## 2021-02-21 NOTE — Telephone Encounter (Signed)
 Per Dr. Jiles No ischemia on nuclear stress test.  I saw her at the hospital  Ventricular bigeminy on ECG  Unifocal PVC, RBBB and superior axis PVCs  Cannot get LVEF due to frequent PVC     May start toprol  xl 50 mg every day.    After further chart review, Dr. Jiles would like to start patient on toprol  xl 25 mg daily, as patient previously developed hypotension on toprol  xl 50 mg.    Called patient but no answer.  Left voicemail with phone number and request for call back.

## 2021-03-05 NOTE — Telephone Encounter (Signed)
03/05/21 LM for tpt of appt time change from 3:20pm to 2:40pm and seeing Foye Clock sll   [phone]

## 2021-03-09 ENCOUNTER — Ambulatory Visit: Attending: Nurse Practitioner | Primary: Family

## 2021-03-09 ENCOUNTER — Ambulatory Visit: Primary: Family

## 2021-03-09 ENCOUNTER — Encounter: Payer: MEDICARE | Primary: Family

## 2021-03-09 ENCOUNTER — Ambulatory Visit: Admit: 2021-03-09 | Payer: MEDICARE | Attending: Nurse Practitioner | Primary: Family

## 2021-03-09 ENCOUNTER — Encounter: Payer: MEDICARE | Attending: Internal Medicine | Primary: Family

## 2021-03-09 ENCOUNTER — Ambulatory Visit: Admit: 2021-03-09 | Discharge: 2021-03-09 | Payer: MEDICARE | Primary: Family

## 2021-03-09 DIAGNOSIS — Z950 Presence of cardiac pacemaker: Secondary | ICD-10-CM

## 2021-03-09 NOTE — Progress Notes (Signed)
 Progress  Notes by Vanice Lauraine POUR, RN at 03/09/21 1500                Author: Vanice Lauraine POUR, RN  Service: --  Author Type: Registered Nurse       Filed: 03/09/21 1610  Encounter Date: 03/09/2021  Status: Signed          Editor: Vanice Lauraine POUR, RN (Registered Nurse)               Room #: 3      Complaining of palpitations at night.        Chief Complaint       Patient presents with        ?  Annual Exam     ?  Pacemaker Check     ?  CHF     ?  Other             PVC           Visit Vitals      BP  (!) 100/58 (BP 1 Location: Right upper arm, BP Patient Position: Sitting, BP Cuff Size: Large adult)     Pulse  60     Resp  18     Ht  5' 7 (1.702 m)     Wt  330 lb (149.7 kg)     SpO2  96%        BMI  51.69 kg/m              Chest pain:  NO   Shortness of breath:  NO   Edema: NO   Palpitations, skipped beats, rapid heartbeat:  YES   Dizziness:  YES      1. Have you been to the ER, urgent care clinic since your last visit?  Hospitalized since your last visit?  NO      2. Have you seen or consulted any other health care providers outside of the [facility] since your last visit?  Include any pap smears or colon screening. NO         Refills:  NO

## 2021-03-09 NOTE — Progress Notes (Signed)
Progress Notes by Jillyn Ledger, NP at 03/09/21 1500                Author: Jillyn Ledger, NP  Service: --  Author Type: Nurse Practitioner       Filed: 03/09/21 1610  Encounter Date: 03/09/2021  Status: Signed          Editor: Jillyn Ledger, NP (Nurse Practitioner)                  Cardiac Electrophysiology Office Follow-up      NAME:  Deborah [name]    DOB:  05-Apr-1955   MRM:  [id number]      Date:  03/09/2021            Assessment and Plan:        1. Pacemaker   2. PVC (premature ventricular contraction)   3. Chronic diastolic congestive heart failure (HCC)   4. Aortic valve replaced       Pacemaker.    [employer] dual chamber pacemaker   1.5 years on battery   A-paced: 22%   V-paced: 40%   No arrhythmias noted   - Continue Q3 month remote transmissions. Follow-up in the EP clinic in one year, or sooner for any concerns.       PVCs.   - Nuclear stress test 02/16/21 showed no ischemia. Ventricular bigeminy. Unifocal PVC, RBBB and superior axis PVCs   - Continue recently started Toprol.      AVR,   - Done in [place]   - Echo 11/28/20: EF 65-70%. Aortic valve not well visualized.       OSA.   - Continue CPAP.                         Subjective:        Deborah [name], a 66 y.o. year-old who presents for followup of pacemaker.      She is feeling better overall following two hospitalizations late last year. One for ischemic colitis and second for CHF. She denies chest pain. Dyspnea improved. Palpitations at times at night.          Review of Systems:    12 point review of systems was performed. All negative except for HPI        Exam:        Physical Exam:   BP (!) 100/58 (BP 1 Location: Right upper arm, BP Patient Position: Sitting, BP Cuff Size: Large adult)    Pulse 60    Resp 18    Ht 5\' 7"  (1.702 m)    Wt 149.7 kg (330 lb)    SpO2 96%    BMI 51.69  kg/m??       PHYSICAL EXAM   General:  Alert and oriented, in no acute distress; in WC; obese   Head:  Atraumatic, normocephalic   Eyes:  extraocular muscles  intact   Neck:  Supple, normal range of motion   Lungs:  Clear to auscultation bilaterally, no wheezes/rales/rhonchi    Cardiovascular:  Regular rate and rhythm, normal S1-S2, no murmurs/rubs/gallops   Abdomen:  Soft, nontender, nondistended, normoactive bowel sounds   Skin:  Intact, no rash   Extremities:, no clubbing, cyanosis, or edema   Musculoskeletal: normal range of motion   Neurological:  Alert and oriented, no focal neurologic deficits   Psychiatric:  Normal mood and affect           Medications:  Current Outpatient Medications        Medication  Sig         ?  metoprolol succinate (TOPROL-XL) 25 mg XL tablet  Take 1 Tablet by mouth daily.     ?  bumetanide (BUMEX) 1 mg tablet  Take 1 Tablet by mouth two (2) times a day.     ?  levothyroxine (SYNTHROID) 150 mcg tablet  Take 1 Tablet by mouth Daily (before breakfast).     ?  aspirin 81 mg chewable tablet  Take 1 Tablet by mouth daily.     ?  lidocaine (Lidoderm) 5 %  Apply patch to the affected area for 12 hours a day and remove for 12 hours a day.     ?  acetaminophen (TYLENOL) 500 mg tablet  Take 1,000 mg by mouth every six (6) hours as needed for Pain.     ?  albuterol (PROVENTIL HFA, VENTOLIN HFA, PROAIR HFA) 90 mcg/actuation inhaler  Take 2 Puffs by inhalation every six (6) hours as needed.     ?  atorvastatin (LIPITOR) 80 mg tablet       ?  citalopram (CELEXA) 40 mg tablet  Take 40 mg by mouth daily.     ?  fluticasone propionate (FLONASE) 50 mcg/actuation nasal spray  2 Sprays by Nasal route daily as needed.     ?  sucralfate (CARAFATE) 1 gram tablet  Take 1 g by mouth three (3) times daily.     ?  traZODone (DESYREL) 100 mg tablet  Take 100 mg by mouth nightly.     ?  glimepiride (AMARYL) 2 mg tablet  Take 2 mg by mouth two (2) times a day.         ?  montelukast (SINGULAIR) 10 mg tablet  Take 10 mg by mouth nightly.          No current facility-administered medications for this visit.           Diagnostic Data Review:             Results for  orders placed or performed during the hospital encounter of 01/24/21     EKG, 12 LEAD, INITIAL         Result  Value  Ref Range            Ventricular Rate  60  BPM       Atrial Rate  60  BPM       P-R Interval  242  ms       QRS Duration  110  ms       Q-T Interval  472  ms       QTC Calculation (Bezet)  472  ms       Calculated P Axis  62  degrees       Calculated R Axis  -55  degrees       Calculated T Axis  59  degrees       Diagnosis                 Atrial-paced rhythm with prolonged AV conduction   Left anterior fascicular block   Minimal voltage criteria for LVH, may be normal variant ( [employer] product )   Possible Anterior infarct , age undetermined   Abnormal ECG   When compared with ECG of 05-Nov-2020 13:08,   premature ventricular complexes are no longer present   Nonspecific T wave abnormality now evident in Anterior leads  Confirmed by Delton See, M.D., [place] [id number]) on 01/26/2021 7:54:17 PM            03/04/2008: [employer] dual chamber pacemaker per Dr. Theodoro Clock           Lab Review:          Lab Results         Component  Value  Date/Time            Cholesterol, total  106  04/30/2020 12:57 AM       HDL Cholesterol  76  04/30/2020 12:57 AM       LDL, calculated  12.6  04/30/2020 12:57 AM       Triglyceride  87  04/30/2020 12:57 AM            CHOL/HDL Ratio  1.4  04/30/2020 12:57 AM          Lab Results         Component  Value  Date/Time            Creatinine  1.24 (H)  01/26/2021 03:11 AM          Lab Results         Component  Value  Date/Time            BUN  14  01/26/2021 03:11 AM          Lab Results         Component  Value  Date/Time            Potassium  3.8  01/26/2021 03:11 AM          Lab Results         Component  Value  Date/Time            Hemoglobin A1c  5.7 (H)  01/25/2021 04:22 AM            Hemoglobin A1c, External  6.1  07/15/2020 12:00 AM          Lab Results         Component  Value  Date/Time            HGB  10.9 (L)  01/25/2021 04:22 AM          Lab Results          Component  Value  Date/Time            PLATELET  78 (L)  01/25/2021 04:22 AM                        ___________________________________________________      Jillyn Ledger, NP      Cardiac Electrophysiology   [facility]    [address]          [address], [place] [address]                 [place], [place] [address]   [phone]                             [phone]

## 2021-03-09 NOTE — Progress Notes (Signed)
Progress  Notes by Brown Human at 03/09/21 1440                Author: Brown Human  Service: --  Author Type: Medical Assistant       Filed: 03/09/21 1545  Encounter Date: 03/09/2021  Status: Signed          Editor: Brown Human (Medical Assistant)               C/ pacer ck/thresholds   Device functioning appropriately as programmed.    See scanned documents

## 2021-04-08 ENCOUNTER — Inpatient Hospital Stay

## 2021-04-08 ENCOUNTER — Emergency Department: Admit: 2021-04-09 | Payer: MEDICARE | Primary: Family

## 2021-04-08 DIAGNOSIS — R6510 Systemic inflammatory response syndrome (SIRS) of non-infectious origin without acute organ dysfunction: Secondary | ICD-10-CM

## 2021-04-08 DIAGNOSIS — A401 Sepsis due to streptococcus, group B: Principal | ICD-10-CM

## 2021-04-08 NOTE — ED Notes (Signed)
 Patient assisted to bedside commode. Urine collection set up.

## 2021-04-08 NOTE — ED Provider Notes (Signed)
Rigors at 3am today with SOB, chills, not improved with tylenol but improved with ibuprofen transiently.  SOB persisted and is worse with lying down. No peripheral edema, hx of CHF and COPD.          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    Rhinitis        Past Surgical History:   Procedure Laterality Date    COLONOSCOPY N/A 12/01/2020    COLONOSCOPY performed by Ian MalkinLee, Alfred, MD at [facility] END[facility]COPY    HX PACEMAKER           Family History:   Problem Relation Age of Onset    Hypertension Mother     Hypertension Father        Social History     Socioeconomic History    Marital status: SINGLE     Spouse name: Not on file    Number of children: Not on file    Years of education: Not on file    Highest education level: Not on file   Occupational History    Not on file   Tobacco Use    Smoking status: Former     Packs/day: 1.00     Years: 20.00     Pack years: 20.00     Types: Cigarettes     Quit date: 03/21/2010     Years since quitting: 11.0    Smokeless tobacco: Never   Vaping Use    Vaping Use: Never used   Substance and Sexual Activity    Alcohol use: Not Currently    Drug use: Never    Sexual activity: Not on file   Other Topics Concern    Not on file   Social History Narrative    Not on file     Social Determinants of Health     Financial Resource Strain: Not on file   Food Insecurity: Not on file   Transportation Needs: Not on file   Physical Activity: Not on file   Stress: Not on file   Social Connections: Not on file   Intimate Partner Violence: Not on file   Housing Stability: Not on file         ALLERGIES: Nitroglycerin, Aloe vera, Hydrochlorothiazide, and Tetanus and  diphther. tox (pf)    Review of Systems   Constitutional:  Positive for chills, fatigue and fever.   Respiratory:  Positive for shortness of breath. Negative for cough.    Cardiovascular:  Negative for chest pain and leg swelling.   Gastrointestinal:  Negative for abdominal pain, constipation, diarrhea, nausea and vomiting.   Neurological:  Positive for light-headedness. Negative for dizziness.   All other systems reviewed and are negative.    Vitals:    04/08/21 2013 04/08/21 2038 04/08/21 2116 04/08/21 2216   BP: 120/60  121/66    Pulse: 84 83 83    Resp: 20  20    Temp: (!) 103.4 ??F (39.7 ??  C)   (!) 101.6 ??F (38.7 ??C)   SpO2: 95%  94%    Weight: 145.2 kg (320 lb)      Height: 5\' 7"  (1.702 m)               Physical Exam  Vitals and nursing note reviewed.   Constitutional:       Appearance: She is well-developed. She is obese. She is ill-appearing. She is not toxic-appearing.   HENT:      Head: Normocephalic and atraumatic.   Eyes:      Pupils: Pupils are equal, round, and reactive to light.   Cardiovascular:      Rate and Rhythm: Normal rate and regular rhythm.   Pulmonary:      Effort: Tachypnea and accessory muscle usage present.      Breath sounds: Wheezing and rhonchi present.   Abdominal:      General: There is no distension.      Palpations: Abdomen is soft.      Tenderness: There is no abdominal tenderness.   Musculoskeletal:      Cervical back: Normal range of motion and neck supple.      Right lower leg: Edema present.      Left lower leg: Edema present.   Skin:     General: Skin is warm and dry.      Capillary Refill: Capillary refill takes less than 2 seconds.   Neurological:      General: No focal deficit present.      Mental Status: She is alert and oriented to person, place, and time.   Psychiatric:         Mood and Affect: Mood normal.         Behavior: Behavior normal.        Medical Decision Making  Amount and/or Complexity of Data Reviewed  Labs: ordered. Decision-making details documented in ED  Course.  Radiology: ordered.  ECG/medicine tests: ordered and independent interpretation performed. Decision-making details documented in ED Course.    Risk  OTC drugs.  Prescription drug management.  Decision regarding hospitalization.      ED Course as of 04/08/21 2232   2233 Apr 08, 2021   2135 EKG, 12 LEAD, INITIAL  ED EKG interpretation:  Rhythm: atrial-sensed Ventricular-paced; and regular . Rate (approx.): 83; Other findings: abnormal ekg. This EKG was interpreted by Apr 10, 2021, MD,ED Provider.  [CH]      ED Course User Index  [CH] Jorje Guild, MD       Procedures    Is this patient to be included in the SEP-1 core measure due to severe sepsis or septic shock? YesSEP-1 CORE MEASURE DATA      Sepsis Criteria   Severe Sepsis Criteria   Septic Shock Criteria       Must meet 2:    [x] Temp >100.9 F (38.3 C) or < 96.8 F (36 C)  [] HR > 90  [x] RR > 20  [] WBC > 12 or < 4 or 10% bands    AND:    []  Infection Confirmed or Suspected.     Must meet 1:    [x] Lactate > 2       or   [] Signs of Organ Dysfunction:    - SBP < 90 or MAP < 65  -Creatinine > 2 or increased from baseline  -Urine Output < 0.5 ml/kg/hr  -Bilirubin > 2  -INR > 1.5 (not anticoagulated)  -Platelets < 100,000  -Acute Respiratory  Failure as evidenced by new need for NIPPV or mechanical ventilation   Must meet 1:    [] Lactate > 4        or   [] SBP < 90 or MAP < 65 for at least two readings in the first hour after fluid bolus administration    [] Vasopressors initiated (if hypotension persists after fluid resuscitation)   Patient Vitals for the past 6 hrs:   Level of Consciousness Temp Temp Source Pulse Heart Rate Source Cardiac Rhythm Resp BP MAP (Monitor) MAP (Calculated) MEWS Score SpO2   04/08/21 2013 0 (!) 103.4 ??F (39.7 ??C) Oral 84 -- -- 20 120/60 -- 80 3 95 %   04/08/21 2038 -- -- -- 83 Monitor Sinus Rhythm -- -- -- -- -- --   04/08/21 2116 -- -- -- 83 -- -- 20 121/66 83 84 -- 94 %   04/08/21 2216 -- (!) 101.6 ??F (38.7 ??C) Oral -- -- --  -- -- -- -- -- --      Recent Labs     04/08/21  2046   WBC 9.1   PLT 57*        Date/Time Sepsis Identified: 20:46    Fluid Resuscitation Rationale: at least 51mL/kg based on ideal body weight due to obesity defined as BMI >30 (patient's BMI is Body mass index is 50.12 kg/m??. and IBW is Ideal body weight: 61.6 kg (135 lb 12.9 oz)  Adjusted ideal body weight: 95 kg (209 lb 7.7 oz))    Repeat lactate level: ordered and pending at this time    Reassessment Exam: Not applicable. Patient does not have septic shock.     Pt presents with SIRS from unknown source.  Received abx and IV fluids with improvement in vital signs.  Admitted to the hospital for additional management.  DDX:  PNA, pericarditis, electrolyte abnormality, dehydration, influenza, UTI, pyelonephritis, gastroenteritis, diverticulitis, cholecystitis, COVID-19, metapneumovirus, COPD exacerbation    Patient is being admitted to the hospital.  The results of their tests and reasons for their admission have been discussed with them and/or available family.  They convey agreement and understanding for the need to be admitted and for their admission diagnosis.  Consultation will be made now with the inpatient physician for hospitalization.    [facility] Consult for Admission  10:31 PM    ED Room Number: ER15/15  Patient Name and age:  Deborah [name] 66 y.o.  female  Working Diagnosis:   1. SIRS (systemic inflammatory response syndrome) (HCC)    2. SOB (shortness of breath)    3. Fever, unspecified fever cause    4. COPD exacerbation (HCC)    5. Thrombocytopenia (HCC)        COVID-19 Suspicion:  no  Sepsis present:  yes  Reassessment needed: no  Code Status:  Full Code  Readmission: no  Isolation Requirements:  no  Recommended Level of Care:  telemetry  Department: 11-11-1986 ED - [phone]  Admitting Provider: Dr. 77

## 2021-04-08 NOTE — Progress Notes (Signed)
[facility] Dosing Services: 04/08/21    Pharmacist Renal Dosing Progress Note for Dr Welton Flakes     The following medication: cefepime was automatically dose-adjusted per [facility] Committee Protocol, with respect to renal function.        Pt Weight:   Wt Readings from Last 1 Encounters:   04/08/21 145.2 kg (320 lb)         Previous Regimen  Cefepime 2gm q8h   Serum Creatinine Lab Results   Component Value Date/Time    Creatinine 1.87 (H) 04/08/2021 08:46 PM       Creatinine Clearance Estimated Creatinine Clearance: 45.6 mL/min (A) (based on SCr of 1.87 mg/dL (H)).   BUN Lab Results   Component Value Date/Time    BUN 29 (H) 04/08/2021 08:46 PM       Dosage changed to:  cefepime 2gm q12h        Pharmacy to continue to monitor patient daily.   Will make dosage adjustments based upon changing renal function.  Signed Judi Cong, PHARMD. Contact information:  [phone]

## 2021-04-08 NOTE — ED Notes (Signed)
 Patient reports intermittent SOB, fever and chills since this afternoon with feelings of dizziness yesterda.     Denies any known sick contacts       O2 87% in WR and 95% in triage.

## 2021-04-09 ENCOUNTER — Inpatient Hospital Stay: Admit: 2021-04-09 | Payer: MEDICARE | Primary: Family

## 2021-04-09 ENCOUNTER — Inpatient Hospital Stay
Admit: 2021-04-09 | Discharge: 2021-04-24 | Disposition: A | Discharge status: Skilled Nursing Facility | Payer: MEDICARE | Attending: Internal Medicine | Admitting: Internal Medicine

## 2021-04-09 LAB — CBC WITH AUTOMATED DIFF
ABS. BASOPHILS: 0 10*3/uL (ref 0.0–0.1)
ABS. BASOPHILS: 0 10*3/uL (ref 0.0–0.1)
ABS. EOSINOPHILS: 0 10*3/uL (ref 0.0–0.4)
ABS. EOSINOPHILS: 0 10*3/uL (ref 0.0–0.4)
ABS. IMM. GRANS.: 0 10*3/uL (ref 0.00–0.04)
ABS. IMM. GRANS.: 0.1 10*3/uL — ABNORMAL HIGH (ref 0.00–0.04)
ABS. LYMPHOCYTES: 0.3 10*3/uL — ABNORMAL LOW (ref 0.8–3.5)
ABS. LYMPHOCYTES: 0.4 10*3/uL — ABNORMAL LOW (ref 0.8–3.5)
ABS. MONOCYTES: 0.2 10*3/uL (ref 0.0–1.0)
ABS. MONOCYTES: 0.2 10*3/uL (ref 0.0–1.0)
ABS. NEUTROPHILS: 8.6 10*3/uL — ABNORMAL HIGH (ref 1.8–8.0)
ABS. NEUTROPHILS: 8.2 10*3/uL — ABNORMAL HIGH (ref 1.8–8.0)
ABSOLUTE NRBC: 0 10*3/uL (ref 0.00–0.01)
ABSOLUTE NRBC: 0 10*3/uL (ref 0.00–0.01)
BASOPHILS: 0 % (ref 0–1)
BASOPHILS: 0 % (ref 0–1)
EOSINOPHILS: 0 % (ref 0–7)
EOSINOPHILS: 0 % (ref 0–7)
HCT: 37.5 % (ref 35.0–47.0)
HCT: 33.3 % — ABNORMAL LOW (ref 35.0–47.0)
HGB: 12.1 g/dL (ref 11.5–16.0)
HGB: 10.6 g/dL — ABNORMAL LOW (ref 11.5–16.0)
IMMATURE GRANULOCYTES: 0 % (ref 0.0–0.5)
IMMATURE GRANULOCYTES: 1 % — ABNORMAL HIGH (ref 0.0–0.5)
LYMPHOCYTES: 3 % — ABNORMAL LOW (ref 12–49)
LYMPHOCYTES: 5 % — ABNORMAL LOW (ref 12–49)
MCH: 29.8 PG (ref 26.0–34.0)
MCH: 29.5 PG (ref 26.0–34.0)
MCHC: 32.3 g/dL (ref 30.0–36.5)
MCHC: 31.8 g/dL (ref 30.0–36.5)
MCV: 92.4 FL (ref 80.0–99.0)
MCV: 92.8 FL (ref 80.0–99.0)
MONOCYTES: 2 % — ABNORMAL LOW (ref 5–13)
MONOCYTES: 2 % — ABNORMAL LOW (ref 5–13)
MPV: 11.3 FL (ref 8.9–12.9)
MPV: 10.9 FL (ref 8.9–12.9)
NEUTROPHILS: 95 % — ABNORMAL HIGH (ref 32–75)
NEUTROPHILS: 92 % — ABNORMAL HIGH (ref 32–75)
NRBC: 0 PER 100 WBC
NRBC: 0 PER 100 WBC
PLATELET: 57 10*3/uL — ABNORMAL LOW (ref 150–400)
PLATELET: 53 10*3/uL — ABNORMAL LOW (ref 150–400)
RBC: 4.06 M/uL (ref 3.80–5.20)
RBC: 3.59 M/uL — ABNORMAL LOW (ref 3.80–5.20)
RDW: 14.6 % — ABNORMAL HIGH (ref 11.5–14.5)
RDW: 14.6 % — ABNORMAL HIGH (ref 11.5–14.5)
WBC: 9.1 10*3/uL (ref 3.6–11.0)
WBC: 8.9 10*3/uL (ref 3.6–11.0)

## 2021-04-09 LAB — EKG, 12 LEAD, INITIAL
Atrial Rate: 83 {beats}/min
Calculated P Axis: 39 degrees
Calculated R Axis: -62 degrees
Calculated T Axis: 90 degrees
P-R Interval: 212 ms
Q-T Interval: 356 ms
QRS Duration: 110 ms
QTC Calculation (Bezet): 418 ms
Ventricular Rate: 83 {beats}/min

## 2021-04-09 LAB — LACTIC ACID
Lactic Acid: 2 MMOL/L (ref 0.4–2.0)
Lactic Acid: 2.8 MMOL/L (ref 0.4–2.0)
Lactic Acid: 2.9 MMOL/L (ref 0.4–2.0)
Lactic Acid: 0.4 MMOL/L (ref 0.4–2.0)
Lactic acid: 2 MMOL/L (ref 0.4–2.0)
Lactic acid: 2.8 MMOL/L — CR (ref 0.4–2.0)
Lactic acid: 2.9 MMOL/L — CR (ref 0.4–2.0)
Lactic acid: 0.4 MMOL/L (ref 0.4–2.0)

## 2021-04-09 LAB — BLOOD CULTURE ID PANEL
Acinetobacter calcoaceticus-baumanii complex: NOT DETECTED
Bacteroides fragilis: NOT DETECTED
Candida albicans: NOT DETECTED
Candida auris: NOT DETECTED
Candida glabrata: NOT DETECTED
Candida krusei: NOT DETECTED
Candida parapsilosis: NOT DETECTED
Candida tropicalis: NOT DETECTED
Crypto neoformans/gattii: NOT DETECTED
Enterobacter cloacae complex: NOT DETECTED
Enterobacterales species: NOT DETECTED
Enterococcus faecalis: NOT DETECTED
Enterococcus faecium: NOT DETECTED
Escherichia coli: NOT DETECTED
Haemophilus influenzae: NOT DETECTED
Klebsiella aerogenes: NOT DETECTED
Klebsiella oxytoca: NOT DETECTED
Klebsiella pneumoniae group: NOT DETECTED
Listeria monocytogenes: NOT DETECTED
Neisseria meningitidis: NOT DETECTED
Proteus: NOT DETECTED
Pseudomonas aeruginosa: NOT DETECTED
Salmonella: NOT DETECTED
Serratia marcescens: NOT DETECTED
Staph epidermidis: NOT DETECTED
Staph lugdunensis: NOT DETECTED
Staphylococcus aureus: NOT DETECTED
Staphylococcus: NOT DETECTED
Steno maltophilia: NOT DETECTED
Streptococcus agalactiae (Group B): DETECTED — AB
Streptococcus pneumoniae: NOT DETECTED
Streptococcus pyogenes (Group A): NOT DETECTED
Streptococcus: DETECTED — AB

## 2021-04-09 LAB — RESPIRATORY VIRUS PANEL W/COVID-19, PCR
Adenovirus: NOT DETECTED
Adenovirus: NOT DETECTED
B. parapertussis, PCR: NOT DETECTED
BORDETELLA PARAPERTUSSIS PCR, BORPAR: NOT DETECTED
Bordetella pertussis - PCR: NOT DETECTED
Bordetella pertussis by PCR: NOT DETECTED
CORONAVIRUS 229E: NOT DETECTED
CORONAVIRUS HKU1: NOT DETECTED
CORONAVIRUS NL63: NOT DETECTED
CORONAVIRUS OC43: NOT DETECTED
Chlamydophila pneumoniae DNA, QL, PCR: NOT DETECTED
Chlamydophilia pneumoniae by PCR: NOT DETECTED
Coronavirus 229E: NOT DETECTED
Coronavirus CVNL63: NOT DETECTED
Coronavirus HKU1: NOT DETECTED
Coronavirus OC43: NOT DETECTED
INFLUENZA A: NOT DETECTED
INFLUENZA B: NOT DETECTED
Influenza A: NOT DETECTED
Influenza B: NOT DETECTED
Metapneumovirus: NOT DETECTED
Metapneumovirus: NOT DETECTED
Mycoplasma pneumoniae DNA, QL, PCR: NOT DETECTED
Mycoplasma pneumoniae by PCR: NOT DETECTED
PARAINFLUENZA 4: NOT DETECTED
Parainfluenza 1: NOT DETECTED
Parainfluenza 1: NOT DETECTED
Parainfluenza 2: NOT DETECTED
Parainfluenza 2: NOT DETECTED
Parainfluenza 3: NOT DETECTED
Parainfluenza 3: NOT DETECTED
Parainfluenza virus 4: NOT DETECTED
RSV by PCR: NOT DETECTED
RSV by PCR: NOT DETECTED
Rhinovirus Enterovirus PCR: NOT DETECTED
Rhinovirus and Enterovirus: NOT DETECTED
SARS Coronavirus-2: NOT DETECTED
SARS-CoV-2, PCR: NOT DETECTED

## 2021-04-09 LAB — GLUCOSE, POC
Glucose (POC): 102 mg/dL (ref 65–117)
Glucose (POC): 256 mg/dL — ABNORMAL HIGH (ref 65–117)
Glucose (POC): 262 mg/dL — ABNORMAL HIGH (ref 65–117)
Glucose (POC): 223 mg/dL — ABNORMAL HIGH (ref 65–117)
Glucose (POC): 209 mg/dL — ABNORMAL HIGH (ref 65–117)

## 2021-04-09 LAB — METABOLIC PANEL, COMPREHENSIVE
A-G Ratio: 0.9 — ABNORMAL LOW (ref 1.1–2.2)
A-G Ratio: 0.8 — ABNORMAL LOW (ref 1.1–2.2)
ALT (SGPT): 17 U/L (ref 12–78)
ALT (SGPT): 16 U/L (ref 12–78)
AST (SGOT): 15 U/L (ref 15–37)
AST (SGOT): 15 U/L (ref 15–37)
Albumin: 3.7 g/dL (ref 3.5–5.0)
Albumin: 3 g/dL — ABNORMAL LOW (ref 3.5–5.0)
Alk. phosphatase: 66 U/L (ref 45–117)
Alk. phosphatase: 46 U/L (ref 45–117)
Anion gap: 4 mmol/L — ABNORMAL LOW (ref 5–15)
Anion gap: 6 mmol/L (ref 5–15)
BUN/Creatinine ratio: 16 (ref 12–20)
BUN/Creatinine ratio: 17 (ref 12–20)
BUN: 29 MG/DL — ABNORMAL HIGH (ref 6–20)
BUN: 33 MG/DL — ABNORMAL HIGH (ref 6–20)
Bilirubin, total: 3.6 MG/DL — ABNORMAL HIGH (ref 0.2–1.0)
Bilirubin, total: 3.2 MG/DL — ABNORMAL HIGH (ref 0.2–1.0)
CO2: 32 mmol/L (ref 21–32)
CO2: 25 mmol/L (ref 21–32)
Calcium: 9.5 MG/DL (ref 8.5–10.1)
Calcium: 8.9 MG/DL (ref 8.5–10.1)
Chloride: 100 mmol/L (ref 97–108)
Chloride: 104 mmol/L (ref 97–108)
Creatinine: 1.87 MG/DL — ABNORMAL HIGH (ref 0.55–1.02)
Creatinine: 1.91 MG/DL — ABNORMAL HIGH (ref 0.55–1.02)
Globulin: 3.9 g/dL (ref 2.0–4.0)
Globulin: 3.6 g/dL (ref 2.0–4.0)
Glucose: 125 mg/dL — ABNORMAL HIGH (ref 65–100)
Glucose: 228 mg/dL — ABNORMAL HIGH (ref 65–100)
Potassium: 4 mmol/L (ref 3.5–5.1)
Potassium: 4.5 mmol/L (ref 3.5–5.1)
Protein, total: 7.6 g/dL (ref 6.4–8.2)
Protein, total: 6.6 g/dL (ref 6.4–8.2)
Sodium: 136 mmol/L (ref 136–145)
Sodium: 135 mmol/L — ABNORMAL LOW (ref 136–145)
eGFR: 30 mL/min/{1.73_m2} — ABNORMAL LOW (ref >60)
eGFR: 29 mL/min/{1.73_m2} — ABNORMAL LOW (ref >60)

## 2021-04-09 LAB — URINALYSIS W/ REFLEX CULTURE
Bilirubin, Urine: NEGATIVE
Bilirubin: NEGATIVE
Blood, Urine: NEGATIVE
Blood: NEGATIVE
Glucose, Ur: NEGATIVE mg/dL
Glucose: NEGATIVE mg/dL
Ketone: NEGATIVE mg/dL
Ketones, Urine: NEGATIVE mg/dL
Nitrite, Urine: NEGATIVE
Nitrites: NEGATIVE
Specific Gravity, UA: 1.013 (ref 1.003–1.030)
Specific gravity: 1.013 (ref 1.003–1.030)
Urobilinogen, UA, POCT: 1 EU/dL (ref 0.2–1.0)
Urobilinogen: 1 EU/dL (ref 0.2–1.0)
pH (UA): 5.5 (ref 5.0–8.0)
pH, UA: 5.5 (ref 5.0–8.0)

## 2021-04-09 LAB — COVID-19 RAPID TEST: COVID-19 rapid test: NOT DETECTED

## 2021-04-09 LAB — D DIMER: D-dimer: 2.13 mg/L FEU — ABNORMAL HIGH (ref 0.00–0.65)

## 2021-04-09 LAB — MAGNESIUM
Magnesium: 1.3 mg/dL — ABNORMAL LOW (ref 1.6–2.4)
Magnesium: 1.3 mg/dL — ABNORMAL LOW (ref 1.6–2.4)
Magnesium: 1.6 mg/dL (ref 1.6–2.4)
Magnesium: 1.6 mg/dL (ref 1.6–2.4)

## 2021-04-09 LAB — TROPONIN-HIGH SENSITIVITY
Troponin-High Sensitivity: 11 ng/L (ref 0–51)
Troponin-High Sensitivity: 10 ng/L (ref 0–51)

## 2021-04-09 LAB — SAMPLES BEING HELD

## 2021-04-09 LAB — POC LACTIC ACID: Lactic Acid (POC): 2.33 mmol/L — CR (ref 0.40–2.00)

## 2021-04-09 LAB — PROCALCITONIN
Procalcitonin: 3.65 ng/mL
Procalcitonin: 3.65 ng/mL

## 2021-04-09 LAB — POCT GLUCOSE
POC Glucose: 102 mg/dL (ref 65–117)
POC Glucose: 256 mg/dL — ABNORMAL HIGH (ref 65–117)
POC Glucose: 262 mg/dL — ABNORMAL HIGH (ref 65–117)
POC Glucose: 223 mg/dL — ABNORMAL HIGH (ref 65–117)
POC Glucose: 209 mg/dL — ABNORMAL HIGH (ref 65–117)

## 2021-04-09 LAB — COMPREHENSIVE METABOLIC PANEL
ALT: 17 U/L (ref 12–78)
ALT: 16 U/L (ref 12–78)
AST: 15 U/L (ref 15–37)
AST: 15 U/L (ref 15–37)
Albumin/Globulin Ratio: 0.9 — ABNORMAL LOW (ref 1.1–2.2)
Albumin/Globulin Ratio: 0.8 — ABNORMAL LOW (ref 1.1–2.2)
Albumin: 3.7 g/dL (ref 3.5–5.0)
Albumin: 3 g/dL — ABNORMAL LOW (ref 3.5–5.0)
Alkaline Phosphatase: 66 U/L (ref 45–117)
Alkaline Phosphatase: 46 U/L (ref 45–117)
Anion Gap: 4 mmol/L — ABNORMAL LOW (ref 5–15)
Anion Gap: 6 mmol/L (ref 5–15)
BUN: 29 MG/DL — ABNORMAL HIGH (ref 6–20)
BUN: 33 MG/DL — ABNORMAL HIGH (ref 6–20)
Bun/Cre Ratio: 16 (ref 12–20)
Bun/Cre Ratio: 17 (ref 12–20)
CO2: 32 mmol/L (ref 21–32)
CO2: 25 mmol/L (ref 21–32)
Calcium: 9.5 MG/DL (ref 8.5–10.1)
Calcium: 8.9 MG/DL (ref 8.5–10.1)
Chloride: 100 mmol/L (ref 97–108)
Chloride: 104 mmol/L (ref 97–108)
Creatinine: 1.87 MG/DL — ABNORMAL HIGH (ref 0.55–1.02)
Creatinine: 1.91 MG/DL — ABNORMAL HIGH (ref 0.55–1.02)
ESTIMATED GLOMERULAR FILTRATION RATE: 30 mL/min/{1.73_m2} — ABNORMAL LOW (ref >60)
ESTIMATED GLOMERULAR FILTRATION RATE: 29 mL/min/{1.73_m2} — ABNORMAL LOW (ref >60)
Globulin: 3.9 g/dL (ref 2.0–4.0)
Globulin: 3.6 g/dL (ref 2.0–4.0)
Glucose: 125 mg/dL — ABNORMAL HIGH (ref 65–100)
Glucose: 228 mg/dL — ABNORMAL HIGH (ref 65–100)
Potassium: 4 mmol/L (ref 3.5–5.1)
Potassium: 4.5 mmol/L (ref 3.5–5.1)
Sodium: 136 mmol/L (ref 136–145)
Sodium: 135 mmol/L — ABNORMAL LOW (ref 136–145)
Total Bilirubin: 3.6 MG/DL — ABNORMAL HIGH (ref 0.2–1.0)
Total Bilirubin: 3.2 MG/DL — ABNORMAL HIGH (ref 0.2–1.0)
Total Protein: 7.6 g/dL (ref 6.4–8.2)
Total Protein: 6.6 g/dL (ref 6.4–8.2)

## 2021-04-09 LAB — CBC WITH AUTO DIFFERENTIAL
Basophils %: 0 % (ref 0–1)
Basophils %: 0 % (ref 0–1)
Basophils Absolute: 0 10*3/uL (ref 0.0–0.1)
Basophils Absolute: 0 10*3/uL (ref 0.0–0.1)
Eosinophils %: 0 % (ref 0–7)
Eosinophils %: 0 % (ref 0–7)
Eosinophils Absolute: 0 10*3/uL (ref 0.0–0.4)
Eosinophils Absolute: 0 10*3/uL (ref 0.0–0.4)
Granulocyte Absolute Count: 0 10*3/uL (ref 0.00–0.04)
Granulocyte Absolute Count: 0.1 10*3/uL — ABNORMAL HIGH (ref 0.00–0.04)
Hematocrit: 37.5 % (ref 35.0–47.0)
Hematocrit: 33.3 % — ABNORMAL LOW (ref 35.0–47.0)
Hemoglobin: 12.1 g/dL (ref 11.5–16.0)
Hemoglobin: 10.6 g/dL — ABNORMAL LOW (ref 11.5–16.0)
Immature Granulocytes: 0 % (ref 0.0–0.5)
Immature Granulocytes: 1 % — ABNORMAL HIGH (ref 0.0–0.5)
Lymphocytes %: 3 % — ABNORMAL LOW (ref 12–49)
Lymphocytes %: 5 % — ABNORMAL LOW (ref 12–49)
Lymphocytes Absolute: 0.3 10*3/uL — ABNORMAL LOW (ref 0.8–3.5)
Lymphocytes Absolute: 0.4 10*3/uL — ABNORMAL LOW (ref 0.8–3.5)
MCH: 29.8 PG (ref 26.0–34.0)
MCH: 29.5 PG (ref 26.0–34.0)
MCHC: 32.3 g/dL (ref 30.0–36.5)
MCHC: 31.8 g/dL (ref 30.0–36.5)
MCV: 92.4 FL (ref 80.0–99.0)
MCV: 92.8 FL (ref 80.0–99.0)
MPV: 11.3 FL (ref 8.9–12.9)
MPV: 10.9 FL (ref 8.9–12.9)
Monocytes %: 2 % — ABNORMAL LOW (ref 5–13)
Monocytes %: 2 % — ABNORMAL LOW (ref 5–13)
Monocytes Absolute: 0.2 10*3/uL (ref 0.0–1.0)
Monocytes Absolute: 0.2 10*3/uL (ref 0.0–1.0)
NRBC Absolute: 0 10*3/uL (ref 0.00–0.01)
NRBC Absolute: 0 10*3/uL (ref 0.00–0.01)
Neutrophils %: 95 % — ABNORMAL HIGH (ref 32–75)
Neutrophils %: 92 % — ABNORMAL HIGH (ref 32–75)
Neutrophils Absolute: 8.6 10*3/uL — ABNORMAL HIGH (ref 1.8–8.0)
Neutrophils Absolute: 8.2 10*3/uL — ABNORMAL HIGH (ref 1.8–8.0)
Nucleated RBCs: 0 PER 100 WBC
Nucleated RBCs: 0 PER 100 WBC
Platelets: 57 10*3/uL — ABNORMAL LOW (ref 150–400)
Platelets: 53 10*3/uL — ABNORMAL LOW (ref 150–400)
RBC: 4.06 M/uL (ref 3.80–5.20)
RBC: 3.59 M/uL — ABNORMAL LOW (ref 3.80–5.20)
RDW: 14.6 % — ABNORMAL HIGH (ref 11.5–14.5)
RDW: 14.6 % — ABNORMAL HIGH (ref 11.5–14.5)
WBC: 9.1 10*3/uL (ref 3.6–11.0)
WBC: 8.9 10*3/uL (ref 3.6–11.0)

## 2021-04-09 LAB — EKG 12-LEAD
Atrial Rate: 83 {beats}/min
P Axis: 39 degrees
P-R Interval: 212 ms
Q-T Interval: 356 ms
QRS Duration: 110 ms
QTc Calculation (Bazett): 418 ms
R Axis: -62 degrees
T Axis: 90 degrees
Ventricular Rate: 83 {beats}/min

## 2021-04-09 LAB — COVID-19, RAPID: SARS-CoV-2, Rapid: NOT DETECTED

## 2021-04-09 LAB — D-DIMER, QUANTITATIVE: D-Dimer, Quant: 2.13 mg/L FEU — ABNORMAL HIGH (ref 0.00–0.65)

## 2021-04-09 LAB — POCT LACTIC ACID: POC Lactic Acid: 2.33 mmol/L (ref 0.40–2.00)

## 2021-04-09 LAB — TROPONIN, HIGH SENSITIVITY
Troponin, High Sensitivity: 11 ng/L (ref 0–51)
Troponin, High Sensitivity: 10 ng/L (ref 0–51)

## 2021-04-09 MED ORDER — GLUCAGON 1 MG INJECTION
1 mg | INTRAMUSCULAR | Status: DC | PRN
Start: 2021-04-09 — End: 2021-04-09

## 2021-04-09 MED ORDER — WATER FOR INJECTION, STERILE INJECTION
2 gram | INTRAMUSCULAR | Status: AC
Start: 2021-04-09 — End: 2021-04-08
  Administered 2021-04-09: 02:00:00 via INTRAVENOUS

## 2021-04-09 MED ORDER — DEXTROSE 10% IN WATER (D10W) IV
10 % | INTRAVENOUS | Status: DC | PRN
Start: 2021-04-09 — End: 2021-04-09

## 2021-04-09 MED ORDER — ALBUTEROL SULFATE HFA 90 MCG/ACTUATION AEROSOL INHALER
90 mcg/actuation | Freq: Once | RESPIRATORY_TRACT | Status: AC
Start: 2021-04-09 — End: 2021-04-08
  Administered 2021-04-09: 01:00:00 via RESPIRATORY_TRACT

## 2021-04-09 MED ORDER — SODIUM CHLORIDE 0.9 % IV PIGGY BACK
2 gram | Freq: Two times a day (BID) | INTRAVENOUS | Status: DC
Start: 2021-04-09 — End: 2021-04-09
  Administered 2021-04-09: 14:00:00 via INTRAVENOUS

## 2021-04-09 MED ORDER — INSULIN REGULAR HUMAN 100 UNIT/ML INJECTION
100 unit/mL | Freq: Three times a day (TID) | INTRAMUSCULAR | Status: DC
Start: 2021-04-09 — End: 2021-04-09
  Administered 2021-04-09 (×2): via SUBCUTANEOUS

## 2021-04-09 MED ORDER — ASPIRIN 81 MG CHEWABLE TAB
81 mg | Freq: Every day | ORAL | Status: DC
Start: 2021-04-09 — End: 2021-04-24
  Administered 2021-04-11 – 2021-04-24 (×14): via ORAL

## 2021-04-09 MED ORDER — CEFTRIAXONE 2 GRAM SOLUTION FOR INJECTION
2 gram | INTRAMUSCULAR | Status: DC
Start: 2021-04-09 — End: 2021-04-15
  Administered 2021-04-09 – 2021-04-14 (×6): via INTRAVENOUS

## 2021-04-09 MED ORDER — MAGNESIUM SULFATE 2 GRAM/50 ML IVPB
2 gram/50 mL (4 %) | Freq: Once | INTRAVENOUS | Status: AC
Start: 2021-04-09 — End: 2021-04-09
  Administered 2021-04-09: 21:00:00 via INTRAVENOUS

## 2021-04-09 MED ORDER — BUDESONIDE 0.5 MG/2 ML NEB SUSPENSION
0.5 mg/2 mL | Freq: Two times a day (BID) | RESPIRATORY_TRACT | Status: DC
Start: 2021-04-09 — End: 2021-04-24
  Administered 2021-04-09 – 2021-04-24 (×36): via RESPIRATORY_TRACT

## 2021-04-09 MED ORDER — AZITHROMYCIN 250 MG TAB
250 mg | Freq: Every day | ORAL | Status: DC
Start: 2021-04-09 — End: 2021-04-09

## 2021-04-09 MED ORDER — DEXTROSE 5% IN WATER (D5W) IV
50 mg/mL | Freq: Once | INTRAVENOUS | Status: DC
Start: 2021-04-09 — End: 2021-04-09

## 2021-04-09 MED ORDER — ACETAMINOPHEN 325 MG TABLET
325 mg | Freq: Four times a day (QID) | ORAL | Status: DC | PRN
Start: 2021-04-09 — End: 2021-04-24
  Administered 2021-04-10 – 2021-04-20 (×12): via ORAL

## 2021-04-09 MED ORDER — SODIUM CHLORIDE 0.9 % IJ SYRG
INTRAMUSCULAR | Status: DC | PRN
Start: 2021-04-09 — End: 2021-04-16
  Administered 2021-04-09 – 2021-04-15 (×2): via INTRAVENOUS

## 2021-04-09 MED ORDER — SODIUM CHLORIDE 0.9% BOLUS IV
0.9 % | Freq: Once | INTRAVENOUS | Status: DC
Start: 2021-04-09 — End: 2021-04-08

## 2021-04-09 MED ORDER — SODIUM CHLORIDE 0.9 % IV
10 mg/mL | INTRAVENOUS | Status: DC
Start: 2021-04-09 — End: 2021-04-09
  Administered 2021-04-09 (×6): via INTRAVENOUS

## 2021-04-09 MED ORDER — SODIUM CHLORIDE 0.9 % IJ SYRG
INTRAMUSCULAR | Status: DC | PRN
Start: 2021-04-09 — End: 2021-04-24

## 2021-04-09 MED ORDER — ATORVASTATIN 10 MG TAB
10 mg | Freq: Every day | ORAL | Status: DC
Start: 2021-04-09 — End: 2021-04-24
  Administered 2021-04-09 – 2021-04-24 (×16): via ORAL

## 2021-04-09 MED ORDER — GLUCOSE 4 GRAM CHEWABLE TAB
4 gram | ORAL | Status: DC | PRN
Start: 2021-04-09 — End: 2021-04-09

## 2021-04-09 MED ORDER — SODIUM CHLORIDE 0.9 % IV
500 mg | Freq: Once | INTRAVENOUS | Status: AC
Start: 2021-04-09 — End: 2021-04-09
  Administered 2021-04-09: 10:00:00 via INTRAVENOUS

## 2021-04-09 MED ORDER — SODIUM CHLORIDE 0.9 % IJ SYRG
Freq: Three times a day (TID) | INTRAMUSCULAR | Status: DC
Start: 2021-04-09 — End: 2021-04-24
  Administered 2021-04-09 – 2021-04-24 (×46): via INTRAVENOUS

## 2021-04-09 MED ORDER — GLUCAGON 1 MG INJECTION
1 mg | INTRAMUSCULAR | Status: DC | PRN
Start: 2021-04-09 — End: 2021-04-24

## 2021-04-09 MED ORDER — SODIUM CHLORIDE 0.9% BOLUS IV
0.9 % | Freq: Once | INTRAVENOUS | Status: AC
Start: 2021-04-09 — End: 2021-04-08
  Administered 2021-04-09: 01:00:00 via INTRAVENOUS

## 2021-04-09 MED ORDER — ONDANSETRON (PF) 4 MG/2 ML INJECTION
4 mg/2 mL | Freq: Four times a day (QID) | INTRAMUSCULAR | Status: DC | PRN
Start: 2021-04-09 — End: 2021-04-24

## 2021-04-09 MED ORDER — ACETAMINOPHEN 500 MG TAB
500 mg | Freq: Once | ORAL | Status: AC
Start: 2021-04-09 — End: 2021-04-08
  Administered 2021-04-09: 01:00:00 via ORAL

## 2021-04-09 MED ORDER — INSULIN REGULAR HUMAN 100 UNIT/ML INJECTION
100 unit/mL | Freq: Four times a day (QID) | INTRAMUSCULAR | Status: DC
Start: 2021-04-09 — End: 2021-04-09

## 2021-04-09 MED ORDER — ACETAMINOPHEN 650 MG RECTAL SUPPOSITORY
650 mg | Freq: Four times a day (QID) | RECTAL | Status: DC | PRN
Start: 2021-04-09 — End: 2021-04-24

## 2021-04-09 MED ORDER — TRAZODONE 50 MG TAB
50 mg | Freq: Every evening | ORAL | Status: DC
Start: 2021-04-09 — End: 2021-04-24
  Administered 2021-04-10 – 2021-04-24 (×14): via ORAL

## 2021-04-09 MED ORDER — SUCRALFATE 1 GRAM TAB
1 gram | Freq: Three times a day (TID) | ORAL | Status: DC
Start: 2021-04-09 — End: 2021-04-24
  Administered 2021-04-09 – 2021-04-24 (×47): via ORAL

## 2021-04-09 MED ORDER — WATER FOR INJECTION, STERILE INJECTION
2 gram | Freq: Three times a day (TID) | INTRAMUSCULAR | Status: DC
Start: 2021-04-09 — End: 2021-04-08

## 2021-04-09 MED ORDER — GLUCOSE 4 GRAM CHEWABLE TAB
4 gram | ORAL | Status: DC | PRN
Start: 2021-04-09 — End: 2021-04-24

## 2021-04-09 MED ORDER — ONDANSETRON 4 MG TAB, RAPID DISSOLVE
4 mg | Freq: Three times a day (TID) | ORAL | Status: DC | PRN
Start: 2021-04-09 — End: 2021-04-24

## 2021-04-09 MED ORDER — DEXAMETHASONE SODIUM PHOSPHATE (PF) 10 MG/ML INJECTION
10 mg/mL | Freq: Once | INTRAMUSCULAR | Status: AC
Start: 2021-04-09 — End: 2021-04-08
  Administered 2021-04-09: 01:00:00 via INTRAVENOUS

## 2021-04-09 MED ORDER — AMIODARONE 50 MG/ML IV SOLN
50 mg/mL | INTRAVENOUS | Status: DC
Start: 2021-04-09 — End: 2021-04-11
  Administered 2021-04-09 – 2021-04-11 (×5): via INTRAVENOUS

## 2021-04-09 MED ORDER — VANCOMYCIN IN 0.9 % SODIUM CHLORIDE 2.5 GRAM/500 ML IV
2.5 gram/500 mL | Freq: Once | INTRAVENOUS | Status: AC
Start: 2021-04-09 — End: 2021-04-09
  Administered 2021-04-09: 15:00:00 via INTRAVENOUS

## 2021-04-09 MED ORDER — CITALOPRAM 20 MG TAB
20 mg | Freq: Every day | ORAL | Status: DC
Start: 2021-04-09 — End: 2021-04-24
  Administered 2021-04-09 – 2021-04-24 (×16): via ORAL

## 2021-04-09 MED ORDER — ENOXAPARIN 30 MG/0.3 ML SUB-Q SYRINGE
30 mg/0.3 mL | Freq: Two times a day (BID) | SUBCUTANEOUS | Status: DC
Start: 2021-04-09 — End: 2021-04-10

## 2021-04-09 MED ORDER — LEVOTHYROXINE 75 MCG TAB
75 mcg | Freq: Every day | ORAL | Status: DC
Start: 2021-04-09 — End: 2021-04-24
  Administered 2021-04-09 – 2021-04-24 (×17): via ORAL

## 2021-04-09 MED ORDER — DEXTROSE 10% IN WATER (D10W) IV
10 % | INTRAVENOUS | Status: DC | PRN
Start: 2021-04-09 — End: 2021-04-24

## 2021-04-09 MED ORDER — SODIUM CHLORIDE 0.9% BOLUS IV
0.9 % | Freq: Once | INTRAVENOUS | Status: DC
Start: 2021-04-09 — End: 2021-04-08
  Administered 2021-04-09: 03:00:00 via INTRAVENOUS

## 2021-04-09 MED ORDER — INSULIN GLARGINE 100 UNIT/ML INJECTION
100 unit/mL | Freq: Once | SUBCUTANEOUS | Status: AC
Start: 2021-04-09 — End: 2021-04-09
  Administered 2021-04-09: 18:00:00 via SUBCUTANEOUS

## 2021-04-09 MED ORDER — NOREPINEPHRINE 8 MG/250 ML (32 MCG/ML) D5W INFUSION
8 mg/250 mL (32 mcg/mL) | INTRAVENOUS | Status: DC
Start: 2021-04-09 — End: 2021-04-11
  Administered 2021-04-09 – 2021-04-10 (×21): via INTRAVENOUS

## 2021-04-09 MED ORDER — SODIUM CHLORIDE 0.9% BOLUS IV
0.9 % | Freq: Once | INTRAVENOUS | Status: AC
Start: 2021-04-09 — End: 2021-04-09
  Administered 2021-04-09: 12:00:00 via INTRAVENOUS

## 2021-04-09 MED ORDER — VANCOMYCIN IN 0.9% SODIUM CHLORIDE 1.5 G/500 ML IV
1.5 g/500 mL | INTRAVENOUS | Status: DC
Start: 2021-04-09 — End: 2021-04-09

## 2021-04-09 MED ORDER — SODIUM CHLORIDE 0.9 % IJ SYRG
INTRAMUSCULAR | Status: DC | PRN
Start: 2021-04-09 — End: 2021-04-16

## 2021-04-09 MED ORDER — POLYETHYLENE GLYCOL 3350 17 GRAM (100 %) ORAL POWDER PACKET
17 gram | Freq: Every day | ORAL | Status: DC | PRN
Start: 2021-04-09 — End: 2021-04-24

## 2021-04-09 MED ORDER — SODIUM CHLORIDE 0.9% BOLUS IV
0.9 % | Freq: Once | INTRAVENOUS | Status: AC
Start: 2021-04-09 — End: 2021-04-09
  Administered 2021-04-09: 09:00:00 via INTRAVENOUS

## 2021-04-09 MED ORDER — INSULIN GLARGINE 100 UNIT/ML INJECTION
100 unit/mL | Freq: Every day | SUBCUTANEOUS | Status: DC
Start: 2021-04-09 — End: 2021-04-09

## 2021-04-09 MED ORDER — HYDROCORTISONE SOD SUCCINATE (PF) 100 MG/2 ML SOLUTION FOR INJECTION
100 mg/2 mL | Freq: Four times a day (QID) | INTRAMUSCULAR | Status: DC
Start: 2021-04-09 — End: 2021-04-09

## 2021-04-09 MED ORDER — INSULIN LISPRO 100 UNIT/ML INJECTION
100 unit/mL | Freq: Four times a day (QID) | SUBCUTANEOUS | Status: DC
Start: 2021-04-09 — End: 2021-04-24
  Administered 2021-04-10 – 2021-04-24 (×20): via SUBCUTANEOUS

## 2021-04-09 MED ORDER — HYDROCORTISONE SOD SUCCINATE (PF) 100 MG/2 ML SOLUTION FOR INJECTION
1002 mg/2 mL | Freq: Four times a day (QID) | INTRAMUSCULAR | Status: DC
Start: 2021-04-09 — End: 2021-04-11
  Administered 2021-04-09 – 2021-04-11 (×8): via INTRAVENOUS

## 2021-04-09 MED ORDER — CEFEPIME 2 GRAM SOLUTION FOR INJECTION
2 gram | INTRAMUSCULAR | Status: DC
Start: 2021-04-09 — End: 2021-04-08
  Administered 2021-04-09: 01:00:00 via INTRAVENOUS

## 2021-04-09 MED ORDER — ARFORMOTEROL 15 MCG/2 ML NEB SOLUTION
15 mcg/2 mL | Freq: Two times a day (BID) | RESPIRATORY_TRACT | Status: DC
Start: 2021-04-09 — End: 2021-04-24
  Administered 2021-04-09 – 2021-04-24 (×36): via RESPIRATORY_TRACT

## 2021-04-09 MED ORDER — LIDOCAINE 4 % TOPICAL PATCH (12 HOUR DURATION)
4 % | CUTANEOUS | Status: DC
Start: 2021-04-09 — End: 2021-04-10

## 2021-04-09 MED ORDER — MONTELUKAST 10 MG TAB
10 mg | Freq: Every evening | ORAL | Status: DC
Start: 2021-04-09 — End: 2021-04-24
  Administered 2021-04-10 – 2021-04-24 (×17): via ORAL

## 2021-04-09 MED FILL — CITALOPRAM 20 MG TAB: 20 mg | ORAL | Qty: 2

## 2021-04-09 MED FILL — ARFORMOTEROL 15 MCG/2 ML NEB SOLUTION: 15 mcg/2 mL | RESPIRATORY_TRACT | Qty: 2

## 2021-04-09 MED FILL — CEFEPIME 2 GRAM SOLUTION FOR INJECTION: 2 gram | INTRAMUSCULAR | Qty: 2

## 2021-04-09 MED FILL — CARAFATE 1 GRAM TABLET: 1 gram | ORAL | Qty: 1

## 2021-04-09 MED FILL — SODIUM CHLORIDE 0.9 % IV: INTRAVENOUS | Qty: 1000

## 2021-04-09 MED FILL — HYDROCORTISONE SOD SUCCINATE (PF) 100 MG/2 ML SOLUTION FOR INJECTION: 100 mg/2 mL | INTRAMUSCULAR | Qty: 2

## 2021-04-09 MED FILL — BUDESONIDE 0.5 MG/2 ML NEB SUSPENSION: 0.5 mg/2 mL | RESPIRATORY_TRACT | Qty: 1

## 2021-04-09 MED FILL — LIDOCAINE 4 % TOPICAL PATCH (12 HOUR DURATION): 4 % | CUTANEOUS | Qty: 2

## 2021-04-09 MED FILL — DEXAMETHASONE SODIUM PHOSPHATE (PF) 10 MG/ML INJECTION: 10 mg/mL | INTRAMUSCULAR | Qty: 1

## 2021-04-09 MED FILL — ATORVASTATIN 10 MG TAB: 10 mg | ORAL | Qty: 1

## 2021-04-09 MED FILL — INSULIN GLARGINE 100 UNIT/ML INJECTION: 100 unit/mL | SUBCUTANEOUS | Qty: 1

## 2021-04-09 MED FILL — INSULIN REGULAR HUMAN 100 UNIT/ML INJECTION: 100 unit/mL | INTRAMUSCULAR | Qty: 5

## 2021-04-09 MED FILL — BD POSIFLUSH NORMAL SALINE 0.9 % INJECTION SYRINGE: INTRAMUSCULAR | Qty: 10

## 2021-04-09 MED FILL — VANCOMYCIN 10 GRAM IV SOLR: 10 gram | INTRAVENOUS | Qty: 2500

## 2021-04-09 MED FILL — VAZCULEP 10 MG/ML INJECTION SOLUTION: 10 mg/mL | INTRAMUSCULAR | Qty: 30

## 2021-04-09 MED FILL — BD POSIFLUSH NORMAL SALINE 0.9 % INJECTION SYRINGE: INTRAMUSCULAR | Qty: 40

## 2021-04-09 MED FILL — LEVOTHYROXINE 50 MCG TAB: 50 mcg | ORAL | Qty: 3

## 2021-04-09 MED FILL — SODIUM CHLORIDE 0.9 % IV: INTRAVENOUS | Qty: 500

## 2021-04-09 MED FILL — VENTOLIN HFA 90 MCG/ACTUATION AEROSOL INHALER: 90 mcg/actuation | RESPIRATORY_TRACT | Qty: 0

## 2021-04-09 MED FILL — NOREPINEPHRINE 8 MG/250 ML (32 MCG/ML) D5W INFUSION: 8 mg/250 mL (32 mcg/mL) | INTRAVENOUS | Qty: 250

## 2021-04-09 MED FILL — CEFTRIAXONE 2 GRAM SOLUTION FOR INJECTION: 2 gram | INTRAMUSCULAR | Qty: 2

## 2021-04-09 MED FILL — AMIODARONE 50 MG/ML IV SOLN: 50 mg/mL | INTRAVENOUS | Qty: 3

## 2021-04-09 MED FILL — AZITHROMYCIN 500 MG IV SOLUTION: 500 mg | INTRAVENOUS | Qty: 5

## 2021-04-09 MED FILL — AMIODARONE 50 MG/ML IV SOLN: 50 mg/mL | INTRAVENOUS | Qty: 375

## 2021-04-09 MED FILL — ACETAMINOPHEN 500 MG TAB: 500 mg | ORAL | Qty: 2

## 2021-04-09 MED FILL — MAGNESIUM SULFATE 2 GRAM/50 ML IVPB: 2 gram/50 mL (4 %) | INTRAVENOUS | Qty: 50

## 2021-04-09 NOTE — ED Notes (Signed)
RT made aware of CPAP order.

## 2021-04-09 NOTE — Progress Notes (Signed)
 Ultrasound IV by Sharyle Rosenthal, RN :  Procedure Note    Patient meets criteria for US  IV insertion.     Ultrasound IV education provided to patient. Opportunities for questions given.      Ultrasound used for PIV placement:  20 gauge 6 cm Arrow Endurance Extended Dwell(may stay in place for 29 days)  Left forearm location.  3 X Attempt(s).    Flushed with ease; vigorous blood return.     Procedure tolerated well. Primary RN aware of IV placement and added to LDA.      Sharyle Rosenthal, RN

## 2021-04-09 NOTE — ED Notes (Signed)
 TRANSFER - OUT REPORT:    Verbal report given to Erin, RN(name) on Deborah [name]  being transferred to ICU stepdown(unit) for routine progression of care       Report consisted of patient's Situation, Background, Assessment and   Recommendations(SBAR).     Information from the following report(s) SBAR, Kardex, ED Summary, Intake/Output and MAR was reviewed with the receiving nurse.    Lines:   Peripheral IV 04/09/21 Right Hand (Active)   Site Assessment Clean, dry, & intact 04/09/21 1038   Phlebitis Assessment 0 04/09/21 1038   Infiltration Assessment 0 04/09/21 1038   Dressing Status Clean, dry, & intact 04/09/21 1038   Hub Color/Line Status Blue 04/09/21 1038       Peripheral IV 04/09/21 Left Antecubital (Active)   Site Assessment Clean, dry, & intact 04/09/21 1038   Phlebitis Assessment 0 04/09/21 1038   Infiltration Assessment 0 04/09/21 1038   Dressing Status Clean, dry, & intact 04/09/21 1038   Hub Color/Line Status Pink 04/09/21 1038   Action Taken Blood drawn 04/09/21 1038        Opportunity for questions and clarification was provided.      Patient transported with:   Monitor  O2 @ 2 liters

## 2021-04-09 NOTE — ED Notes (Signed)
 Notified Dr. Jamil through perfectserve regarding lactic and d-dimer results.

## 2021-04-09 NOTE — Progress Notes (Signed)
 Bilateral LE venous duplex completed. Final results to follow.

## 2021-04-09 NOTE — Progress Notes (Signed)
1900: Bedside and Verbal shift change report given to Bry, Charity fundraiser (Cabin crew) by Denny Peon, RN (offgoing nurse). Report included the following information SBAR, Kardex, Intake/Output, MAR, Recent Results, Cardiac Rhythm A.Paced, Alarm Parameters , and Quality Measures.     2000: Shift assessment completed; see flowsheet    0000: Reassessment completed; see flowsheet    0400: Reassessment completed; see flowsheet    0700: Bedside and Verbal shift change report given to Denny Peon, RN (oncoming nurse) by Demetrios Loll, RN (offgoing nurse). Report included the following information SBAR, Kardex, Intake/Output, MAR, Recent Results, Cardiac Rhythm A. Paced, Alarm Parameters , and Quality Measures.

## 2021-04-09 NOTE — Progress Notes (Signed)
 Messaged attending regarding patient's BP being 87/53. Attending advised that this is the patient's baseline and that we should watch for a MAP below 60.

## 2021-04-09 NOTE — ED Notes (Signed)
Left great toe blister noted with redness, present on admission. Patient unaware that this was on her toe.

## 2021-04-09 NOTE — ED Notes (Signed)
 Rounded on patient noted to be hypoxic while resting, placed on 2L NC.

## 2021-04-09 NOTE — Progress Notes (Signed)
 1110: TRANSFER - IN REPORT:    Verbal report received from [facility] (name) on Deborah [name]  being received from ED (unit) for routine progression of care      Report consisted of patient's Situation, Background, Assessment and   Recommendations(SBAR).     Information from the following report(s) SBAR, Kardex, ED Summary, Intake/Output, MAR, Recent Results, Cardiac Rhythm APaced, and Alarm Parameters  was reviewed with the receiving nurse.    Opportunity for questions and clarification was provided.      1230: Patient arrived from ED, vital signs stable    1300: Assessment completed upon patient's arrival to unit and care assumed.     1530: patient in wide complex arrhythmia, cardiology consulted, NP at bedside; patient alert and oriented, no complaints of chest pain or SOB, Labs drawn and sent, EKG performed    1600: Dr. Quin at bedside; assessment completed    1900: Bedside and Verbal shift change report given to Bryanna (oncoming nurse) by Rocky (offgoing nurse). Report included the following information SBAR, Kardex, Intake/Output, MAR, Recent Results, and Cardiac Rhythm APaced .

## 2021-04-09 NOTE — Progress Notes (Signed)
[facility] Progress Note: Antimicrobial Stewardship    Consult for antibiotic dosing of vancomycin by Dr. Burnett Corrente  Indication: sepsis  Day of Therapy: 1    Plan:  Vancomycin therapy:   Start with loading dose of vancomycin 2,500 mg IV x 1 dose   Follow with maintenance dose of vancomycin 1,500 mg IV every 36 hours   Dose calculated to approximate a target AUC/MIC of 400-600  Pharmacist will follow daily and will make changes to dose and/or frequency based on clinical status.    Other Antimicrobial  (not dosed by pharmacist)   Azithromycin 500 mg PO daily  Cefepime 2 grams IV every 12 hours   Cultures     04/08/2021 Blood: 2 of 2 bottles flagged positive (pending)  04/08/2021 Blood: 2 of 2 bottles flagged positive (pending)   Serum Creatinine     Lab Results   Component Value Date/Time    Creatinine 1.91 (H) 04/09/2021 04:40 AM       Creatinine Clearance Estimated Creatinine Clearance: 44.6 mL/min (A) (based on SCr of 1.91 mg/dL (H)).     Procalcitonin    Lab Results   Component Value Date/Time    Procalcitonin 3.65 04/09/2021 05:34 AM      Temp   98.1 F (36.7 C)    WBC   Lab Results   Component Value Date/Time    WBC 8.9 04/09/2021 04:40 AM       For Antifungals, Metronidazole and Nafcillin: Lab Results   Component Value Date/Time    ALT (SGPT) 16 04/09/2021 04:40 AM    AST (SGOT) 15 04/09/2021 04:40 AM    Alk. phosphatase 46 04/09/2021 04:40 AM    Bilirubin, total 3.2 (H) 04/09/2021 04:40 AM         Pharmacist: Volanda Napoleon

## 2021-04-09 NOTE — ED Notes (Signed)
 Rounded on patient noted to be stable on 2L NC, alert and oriented, denies pain at this time.

## 2021-04-09 NOTE — Progress Notes (Signed)
 Ultrasound IV by Dorothyann CHRISTELLA Carwin, RN :  Procedure Note    Patient meets criteria for US  IV insertion.     Ultrasound IV education provided to patient. Opportunities for questions given.      Ultrasound used for PIV placement:  20gauge 8 cm BD Nexiva  R forearm location.  1 X Attempt(s).    Flushed with ease; vigorous blood return.     Procedure tolerated well. Primary RN aware of IV placement and added to LDA.      Dorothyann CHRISTELLA Carwin, RN

## 2021-04-10 ENCOUNTER — Inpatient Hospital Stay: Admit: 2021-04-10 | Payer: MEDICARE | Primary: Family

## 2021-04-10 LAB — GLUCOSE, POC
Glucose (POC): 212 mg/dL — ABNORMAL HIGH (ref 65–117)
Glucose (POC): 224 mg/dL — ABNORMAL HIGH (ref 65–117)
Glucose (POC): 226 mg/dL — ABNORMAL HIGH (ref 65–117)
Glucose (POC): 217 mg/dL — ABNORMAL HIGH (ref 65–117)
Glucose (POC): 241 mg/dL — ABNORMAL HIGH (ref 65–117)
Glucose (POC): 158 mg/dL — ABNORMAL HIGH (ref 65–117)

## 2021-04-10 LAB — METABOLIC PANEL, COMPREHENSIVE
A-G Ratio: 0.7 — ABNORMAL LOW (ref 1.1–2.2)
ALT (SGPT): 18 U/L (ref 12–78)
AST (SGOT): 17 U/L (ref 15–37)
Albumin: 3.1 g/dL — ABNORMAL LOW (ref 3.5–5.0)
Alk. phosphatase: 49 U/L (ref 45–117)
Anion gap: 7 mmol/L (ref 5–15)
BUN/Creatinine ratio: 23 — ABNORMAL HIGH (ref 12–20)
BUN: 36 MG/DL — ABNORMAL HIGH (ref 6–20)
Bilirubin, total: 2.1 MG/DL — ABNORMAL HIGH (ref 0.2–1.0)
CO2: 25 mmol/L (ref 21–32)
Calcium: 9.6 MG/DL (ref 8.5–10.1)
Chloride: 104 mmol/L (ref 97–108)
Creatinine: 1.54 MG/DL — ABNORMAL HIGH (ref 0.55–1.02)
Globulin: 4.2 g/dL — ABNORMAL HIGH (ref 2.0–4.0)
Glucose: 249 mg/dL — ABNORMAL HIGH (ref 65–100)
Potassium: 4.2 mmol/L (ref 3.5–5.1)
Protein, total: 7.3 g/dL (ref 6.4–8.2)
Sodium: 136 mmol/L (ref 136–145)
eGFR: 37 mL/min/{1.73_m2} — ABNORMAL LOW (ref >60)

## 2021-04-10 LAB — CBC WITH AUTOMATED DIFF
ABS. BASOPHILS: 0 10*3/uL (ref 0.0–0.1)
ABS. EOSINOPHILS: 0 10*3/uL (ref 0.0–0.4)
ABS. IMM. GRANS.: 0.1 10*3/uL — ABNORMAL HIGH (ref 0.00–0.04)
ABS. LYMPHOCYTES: 0.8 10*3/uL (ref 0.8–3.5)
ABS. MONOCYTES: 0.5 10*3/uL (ref 0.0–1.0)
ABS. NEUTROPHILS: 11.5 10*3/uL — ABNORMAL HIGH (ref 1.8–8.0)
ABSOLUTE NRBC: 0 10*3/uL (ref 0.00–0.01)
BASOPHILS: 0 % (ref 0–1)
EOSINOPHILS: 0 % (ref 0–7)
HCT: 35 % (ref 35.0–47.0)
HGB: 11.4 g/dL — ABNORMAL LOW (ref 11.5–16.0)
IMMATURE GRANULOCYTES: 1 % — ABNORMAL HIGH (ref 0.0–0.5)
LYMPHOCYTES: 6 % — ABNORMAL LOW (ref 12–49)
MCH: 29.8 PG (ref 26.0–34.0)
MCHC: 32.6 g/dL (ref 30.0–36.5)
MCV: 91.6 FL (ref 80.0–99.0)
MONOCYTES: 4 % — ABNORMAL LOW (ref 5–13)
MPV: 11.4 FL (ref 8.9–12.9)
NEUTROPHILS: 89 % — ABNORMAL HIGH (ref 32–75)
NRBC: 0 PER 100 WBC
PLATELET: 64 10*3/uL — ABNORMAL LOW (ref 150–400)
RBC: 3.82 M/uL (ref 3.80–5.20)
RDW: 14.8 % — ABNORMAL HIGH (ref 11.5–14.5)
WBC: 12.9 10*3/uL — ABNORMAL HIGH (ref 3.6–11.0)

## 2021-04-10 LAB — ECHO ADULT COMPLETE
EF BP: 63 % (ref 55–100)
Fractional Shortening 2D: 23 % (ref 28–44)
IVSd: 1.2 cm — AB
LA Volume 2C: 61 mL — AB (ref 22–52)
LA Volume Index 2C: 24 mL/m2 (ref 16–34)
LV E' Lateral Velocity: 8 cm/s
LV E' Septal Velocity: 5 cm/s
LV EDV A2C: 81 mL
LV EDV A4C: 72 mL
LV EDV BP: 78 mL (ref 56–104)
LV EDV Index A2C: 32 mL/m2
LV EDV Index A4C: 28 mL/m2
LV EDV Index BP: 30 mL/m2
LV ESV A2C: 21 mL
LV ESV A4C: 37 mL
LV ESV BP: 29 mL (ref 19–49)
LV ESV Index A2C: 8 mL/m2
LV ESV Index A4C: 14 mL/m2
LV ESV Index BP: 11 mL/m2
LV Ejection Fraction A2C: 74 %
LV Ejection Fraction A4C: 50 %
LV Mass 2D Index: 76.6 g/m2 (ref 43–95)
LV Mass 2D: 196.1 g — AB (ref 67–162)
LV RWT Ratio: 0.6
LVIDd Index: 1.68 cm/m2
LVIDd: 4.3 cm (ref 3.9–5.3)
LVIDs Index: 1.29 cm/m2
LVIDs: 3.3 cm
LVPWd: 1.3 cm — AB
MV A Velocity: 0.01 m/s
MV A Velocity: 1.03 m/s
MV E Velocity: 1.27 m/s
MV E Velocity: 1.31 m/s
MV E Wave Deceleration Time: 232.7 ms
PV Max Velocity: 0.8 m/s
PV Peak Gradient: 3 mmHg
TR Max Velocity: 1.41 m/s
TR Peak Gradient: 8 mmHg

## 2021-04-10 LAB — CULTURE, URINE
Culture result:: NO GROWTH
Culture: NO GROWTH

## 2021-04-10 LAB — LACTIC ACID
Lactic Acid: 2 MMOL/L (ref 0.4–2.0)
Lactic Acid: 1.9 MMOL/L (ref 0.4–2.0)
Lactic acid: 2 MMOL/L (ref 0.4–2.0)
Lactic acid: 1.9 MMOL/L (ref 0.4–2.0)

## 2021-04-10 LAB — CULTURE, MRSA

## 2021-04-10 LAB — MAGNESIUM
Magnesium: 2.1 mg/dL (ref 1.6–2.4)
Magnesium: 2.1 mg/dL (ref 1.6–2.4)

## 2021-04-10 LAB — COMPREHENSIVE METABOLIC PANEL
ALT: 18 U/L (ref 12–78)
AST: 17 U/L (ref 15–37)
Albumin/Globulin Ratio: 0.7 — ABNORMAL LOW (ref 1.1–2.2)
Albumin: 3.1 g/dL — ABNORMAL LOW (ref 3.5–5.0)
Alkaline Phosphatase: 49 U/L (ref 45–117)
Anion Gap: 7 mmol/L (ref 5–15)
BUN: 36 MG/DL — ABNORMAL HIGH (ref 6–20)
Bun/Cre Ratio: 23 — ABNORMAL HIGH (ref 12–20)
CO2: 25 mmol/L (ref 21–32)
Calcium: 9.6 MG/DL (ref 8.5–10.1)
Chloride: 104 mmol/L (ref 97–108)
Creatinine: 1.54 MG/DL — ABNORMAL HIGH (ref 0.55–1.02)
ESTIMATED GLOMERULAR FILTRATION RATE: 37 mL/min/{1.73_m2} — ABNORMAL LOW (ref >60)
Globulin: 4.2 g/dL — ABNORMAL HIGH (ref 2.0–4.0)
Glucose: 249 mg/dL — ABNORMAL HIGH (ref 65–100)
Potassium: 4.2 mmol/L (ref 3.5–5.1)
Sodium: 136 mmol/L (ref 136–145)
Total Bilirubin: 2.1 MG/DL — ABNORMAL HIGH (ref 0.2–1.0)
Total Protein: 7.3 g/dL (ref 6.4–8.2)

## 2021-04-10 LAB — POCT GLUCOSE
POC Glucose: 212 mg/dL — ABNORMAL HIGH (ref 65–117)
POC Glucose: 224 mg/dL — ABNORMAL HIGH (ref 65–117)
POC Glucose: 226 mg/dL — ABNORMAL HIGH (ref 65–117)
POC Glucose: 217 mg/dL — ABNORMAL HIGH (ref 65–117)
POC Glucose: 241 mg/dL — ABNORMAL HIGH (ref 65–117)
POC Glucose: 158 mg/dL — ABNORMAL HIGH (ref 65–117)

## 2021-04-10 LAB — CBC WITH AUTO DIFFERENTIAL
Basophils %: 0 % (ref 0–1)
Basophils Absolute: 0 10*3/uL (ref 0.0–0.1)
Eosinophils %: 0 % (ref 0–7)
Eosinophils Absolute: 0 10*3/uL (ref 0.0–0.4)
Granulocyte Absolute Count: 0.1 10*3/uL — ABNORMAL HIGH (ref 0.00–0.04)
Hematocrit: 35 % (ref 35.0–47.0)
Hemoglobin: 11.4 g/dL — ABNORMAL LOW (ref 11.5–16.0)
Immature Granulocytes: 1 % — ABNORMAL HIGH (ref 0.0–0.5)
Lymphocytes %: 6 % — ABNORMAL LOW (ref 12–49)
Lymphocytes Absolute: 0.8 10*3/uL (ref 0.8–3.5)
MCH: 29.8 PG (ref 26.0–34.0)
MCHC: 32.6 g/dL (ref 30.0–36.5)
MCV: 91.6 FL (ref 80.0–99.0)
MPV: 11.4 FL (ref 8.9–12.9)
Monocytes %: 4 % — ABNORMAL LOW (ref 5–13)
Monocytes Absolute: 0.5 10*3/uL (ref 0.0–1.0)
NRBC Absolute: 0 10*3/uL (ref 0.00–0.01)
Neutrophils %: 89 % — ABNORMAL HIGH (ref 32–75)
Neutrophils Absolute: 11.5 10*3/uL — ABNORMAL HIGH (ref 1.8–8.0)
Nucleated RBCs: 0 PER 100 WBC
Platelets: 64 10*3/uL — ABNORMAL LOW (ref 150–400)
RBC: 3.82 M/uL (ref 3.80–5.20)
RDW: 14.8 % — ABNORMAL HIGH (ref 11.5–14.5)
WBC: 12.9 10*3/uL — ABNORMAL HIGH (ref 3.6–11.0)

## 2021-04-10 LAB — ECHO (TTE) COMPLETE (PRN CONTRAST/BUBBLE/STRAIN/3D)
EF BP: 63 % (ref 55–100)
Fractional Shortening 2D: 23 % (ref 28–44)
IVSd: 1.2 cm — AB (ref 0.6–0.9)
LA Volume A-L A4C: 61 mL — AB (ref 22–52)
LA Volume Index A-L A2C: 24 mL/m2 (ref 16–34)
LV E' Lateral Velocity: 8 cm/s
LV E' Septal Velocity: 5 cm/s
LV EDV A2C: 81 mL
LV EDV A4C: 72 mL
LV EDV BP: 78 mL (ref 56–104)
LV EDV Index A2C: 32 mL/m2
LV EDV Index A4C: 28 mL/m2
LV EDV Index BP: 30 mL/m2
LV ESV A2C: 21 mL
LV ESV A4C: 37 mL
LV ESV BP: 29 mL (ref 19–49)
LV ESV Index A2C: 8 mL/m2
LV ESV Index A4C: 14 mL/m2
LV ESV Index BP: 11 mL/m2
LV Ejection Fraction A2C: 74 %
LV Ejection Fraction A4C: 50 %
LV Mass 2D Index: 76.6 g/m2 (ref 43–95)
LV Mass 2D: 196.1 g — AB (ref 67–162)
LV RWT Ratio: 0.6
LVIDd Index: 1.68 cm/m2
LVIDd: 4.3 cm (ref 3.9–5.3)
LVIDs Index: 1.29 cm/m2
LVIDs: 3.3 cm
LVPWd: 1.3 cm — AB (ref 0.6–0.9)
MV A Velocity: 0.01 m/s
MV A Velocity: 1.03 m/s
MV E Velocity: 1.27 m/s
MV E Velocity: 1.31 m/s
MV E Wave Deceleration Time: 232.7 ms
PV Max Velocity: 0.8 m/s
PV Peak Gradient: 3 mmHg
TR Max Velocity: 1.41 m/s
TR Peak Gradient: 8 mmHg

## 2021-04-10 MED ORDER — PERFLUTREN LIPID MICROSPHERES 1.1 MG/ML IV
1.1 mg/mL | Freq: Once | INTRAVENOUS | Status: AC
Start: 2021-04-10 — End: 2021-04-10
  Administered 2021-04-10: 18:00:00 via INTRAVENOUS

## 2021-04-10 MED ORDER — HEPARIN (PORCINE) 5,000 UNIT/ML IJ SOLN
5000 unit/mL | Freq: Three times a day (TID) | INTRAMUSCULAR | Status: DC
Start: 2021-04-10 — End: 2021-04-24
  Administered 2021-04-10 – 2021-04-24 (×43): via SUBCUTANEOUS

## 2021-04-10 MED ORDER — ENOXAPARIN 40 MG/0.4 ML SUB-Q SYRINGE
40 mg/0.4 mL | Freq: Two times a day (BID) | SUBCUTANEOUS | Status: DC
Start: 2021-04-10 — End: 2021-04-10

## 2021-04-10 MED ORDER — LIDOCAINE 4 % TOPICAL PATCH (12 HOUR DURATION)
4 % | CUTANEOUS | Status: DC
Start: 2021-04-10 — End: 2021-04-24

## 2021-04-10 MED FILL — LEVOTHYROXINE 75 MCG TAB: 75 mcg | ORAL | Qty: 2

## 2021-04-10 MED FILL — AMIODARONE 50 MG/ML IV SOLN: 50 mg/mL | INTRAVENOUS | Qty: 375

## 2021-04-10 MED FILL — NOREPINEPHRINE 8 MG/250 ML (32 MCG/ML) D5W INFUSION: 8 mg/250 mL (32 mcg/mL) | INTRAVENOUS | Qty: 250

## 2021-04-10 MED FILL — CITALOPRAM 20 MG TAB: 20 mg | ORAL | Qty: 2

## 2021-04-10 MED FILL — CARAFATE 1 GRAM TABLET: 1 gram | ORAL | Qty: 1

## 2021-04-10 MED FILL — ATORVASTATIN 10 MG TAB: 10 mg | ORAL | Qty: 1

## 2021-04-10 MED FILL — TRAZODONE 100 MG TAB: 100 mg | ORAL | Qty: 1

## 2021-04-10 MED FILL — DEFINITY 1.1 MG/ML INTRAVENOUS SUSPENSION: 1.1 mg/mL | INTRAVENOUS | Qty: 2

## 2021-04-10 MED FILL — ARFORMOTEROL 15 MCG/2 ML NEB SOLUTION: 15 mcg/2 mL | RESPIRATORY_TRACT | Qty: 2

## 2021-04-10 MED FILL — HYDROCORTISONE SOD SUCCINATE (PF) 100 MG/2 ML SOLUTION FOR INJECTION: 100 mg/2 mL | INTRAMUSCULAR | Qty: 2

## 2021-04-10 MED FILL — ACETAMINOPHEN 325 MG TABLET: 325 mg | ORAL | Qty: 2

## 2021-04-10 MED FILL — LIDOCAINE 4 % TOPICAL PATCH (12 HOUR DURATION): 4 % | CUTANEOUS | Qty: 2

## 2021-04-10 MED FILL — BUDESONIDE 0.5 MG/2 ML NEB SUSPENSION: 0.5 mg/2 mL | RESPIRATORY_TRACT | Qty: 1

## 2021-04-10 MED FILL — CEFTRIAXONE 2 GRAM SOLUTION FOR INJECTION: 2 gram | INTRAMUSCULAR | Qty: 2

## 2021-04-10 MED FILL — INSULIN LISPRO 100 UNIT/ML INJECTION: 100 unit/mL | SUBCUTANEOUS | Qty: 1

## 2021-04-10 MED FILL — HEPARIN (PORCINE) 5,000 UNIT/ML IJ SOLN: 5000 unit/mL | INTRAMUSCULAR | Qty: 1

## 2021-04-10 MED FILL — MONTELUKAST 10 MG TAB: 10 mg | ORAL | Qty: 1

## 2021-04-10 NOTE — Progress Notes (Signed)
 Problem: Falls - Risk of  Goal: *Absence of Falls  Description: Document Deborah [name] Fall Risk and appropriate interventions in the flowsheet.  Outcome: Progressing Towards Goal  Note: Fall Risk Interventions:

## 2021-04-10 NOTE — Progress Notes (Signed)
 13:18   Called down to pharmacy  Definity order has not arrived.  Ordered approx 13:00.      dh

## 2021-04-10 NOTE — Progress Notes (Signed)
 Spiritual Care Assessment/Progress Note  [facility]      NAME: Deborah [name]      MRN: [id number]  AGE: 66 y.o. SEX: female  Religious Affiliation: Other   Language: English     04/10/2021     Total Time (in minutes): 10     Spiritual Assessment begun in [facility] 3 INTENSIVE CARE through conversation with:         [] Patient        []  Family    []  Friend(s)        Reason for Consult: Initial/Spiritual assessment, critical care     Spiritual beliefs: (Please include comment if needed)     []  Identifies with a faith tradition:         []  Supported by a faith community:            []  Claims no spiritual orientation:           []  Seeking spiritual identity:                []  Adheres to an individual form of spirituality:           [x]  Not able to assess:                           Identified resources for coping:      []  Prayer                               []  Music                  []  Guided Imagery     []  Family/friends                 []  Pet visits     []  Devotional reading                         [x]  Unknown     []  Other:                                               Interventions offered during this visit: (See comments for more details)                Plan of Care:     []  Support spiritual and/or cultural needs    []  Support AMD and/or advance care planning process      []  Support grieving process   []  Coordinate Rites and/or Rituals    []  Coordination with community clergy   []  No spiritual needs identified at this time   []  Detailed Plan of Care below (See Comments)  []  Make referral to Music Therapy  []  Make referral to Pet Therapy     []  Make referral to Addiction services  []  Make referral to [facility] Passages  []  Make referral to Spiritual Care Partner  []  No future visits requested        [x]  Contact Spiritual Care for further referrals     Comments: Chaplain visit for the patient in ICU. Reviewed patient's chart and spoke with patient's nurse. Medical staff is with patient at this time. Pt is having a  procedure. Chart notes do not specify a religious affiliation.  Offered words of comfort, peace, and healing. Provided a ministry of presence. Please contact Spiritual Care for further referrals.    Chaplain Suzen Rudy Guardian, MS, MDiv, [facility]  Staff Chaplain  Paging service: [phone] (PRAY)

## 2021-04-10 NOTE — Progress Notes (Signed)
 Reason for Admission:   SOB,chills,fatigue.light-headedness,tachypnea                  RUR Score:     19%             PCP: First and Last name:   [facility]=Dr Alona     Name of Practice: [facility]   Are you a current patient: Yes/No:    Approximate date of last visit:    Can you participate in a virtual visit if needed: no    Do you (patient/family) have any concerns for transition/discharge?     no              Plan for utilizing home health:  to be determined    Current Advanced Directive/Advance Care Plan:  Full Code      Healthcare Decision Maker:             Janellie Tennison @ [phone]    Transition of Care Plan:           Problems:  SOB  Chest pain  Septic shock  COPD  VT  PM is not compatible with MRI  GBS bacteremia  Has prosthetic heart valve    Echo completed.Results ae pending.    I met with pt as an introductory visit to begin discharge planning.Pt lives with her son,Devon & his wife Delon in a one story double wide trailer with 4 entry steps with a railing.  Pt has a wheelchair,rolling walker,cane and shower chair.  Pt plans to talk with her son about installing grab bars as she has difficulty getting in & out of the bathtub.  Pt does not have oxygen.  Pt sees Dr Jiles in the outpatient setting.  Pt recently moved to [place] from [facility] one year ago.  Pt states she was in a coma for 43 days ,had a trach and was decannulated 30 days later.Pt states she is a difficult intubation due to her anatomy.    Pt uses [facility] .    Pt has a home [facility] she uses also but has had no follow-up since moving to [place] .    Pt has [employer] Medicare complete.    I will continue to follow pt for discharge needs .    Please consider PT/OT to mobilize pt before discharging home.    Pt is currently still on an amiodarone  gtt and levophed gtt.    Elizabeth Aristov,RN

## 2021-04-10 NOTE — Progress Notes (Signed)
 Patient refused night time bipap says she will be fine without it.

## 2021-04-10 NOTE — Progress Notes (Signed)
 0700: Bedside and Verbal shift change report given to Erin (Cabin crew) by Clent (offgoing nurse). Report included the following information SBAR, Kardex, Intake/Output, MAR, Recent Results, and Cardiac Rhythm Apaced .      0800: assessment completed    1200: reassessment completed    1245: ECHO tech at bedside    1600: reassessment completed    1900: Bedside and Verbal shift change report given to Inocente (Cabin crew) by Rocky (offgoing nurse). Report included the following information SBAR, Kardex, Intake/Output, MAR, Recent Results, and Cardiac Rhythm A Paced .

## 2021-04-10 NOTE — Progress Notes (Signed)
 Received fax from [facility] for MRI order.  Pt is NOT MRI compatible due to old device.  Faxed to [facility] MRI fax [phone].  Received confirmation.

## 2021-04-10 NOTE — Progress Notes (Signed)
 Vascular Access Team:  Order acknowledged for PICC line for long term antibiotics.      Awaiting MRI, and Echocardiogram, as well as second blood culture result following a positive blood culture.  Will plan to place line closer to discharge as appropriate.  Will follow chart daily.    Krozier RN

## 2021-04-10 NOTE — Progress Notes (Signed)
 Problem: Pressure Injury - Risk of  Goal: *Prevention of pressure injury  Description: Document Braden Scale and appropriate interventions in the flowsheet.  Outcome: Progressing Towards Goal  Note: Pressure Injury Interventions:  Sensory Interventions: Assess changes in LOC, Assess need for specialty bed, Keep linens dry and wrinkle-free, Minimize linen layers, Pressure redistribution bed/mattress (bed type), Turn and reposition approx. every two hours (pillows and wedges if needed)    Moisture Interventions: Absorbent underpads, Assess need for specialty bed, Apply protective barrier, creams and emollients, Check for incontinence Q2 hours and as needed, Internal/External urinary devices, Minimize layers    Activity Interventions: Assess need for specialty bed, Pressure redistribution bed/mattress(bed type)    Mobility Interventions: Assess need for specialty bed, Pressure redistribution bed/mattress (bed type), Turn and reposition approx. every two hours(pillow and wedges)    Nutrition Interventions: Document food/fluid/supplement intake    Friction and Shear Interventions: Apply protective barrier, creams and emollients, Foam dressings/transparent film/skin sealants, Transferring/repositioning devices

## 2021-04-10 NOTE — Progress Notes (Signed)
2000 Bedside and Verbal shift change report given to Harriett Sine, Charity fundraiser (oncoming nurse) by Denny Peon, RN (offgoing nurse). Report included the following information SBAR, Kardex, ED Summary, OR Summary, Procedure Summary, Intake/Output, MAR, and Recent Results.  Primary Nurse Vallery Sa, RN and Denny Peon, RN performed a dual skin assessment on this patient No impairment noted. See assessment for Braden scale.  2400 Pt refusing BIPAP pt on 3LNC. RT to bedside and aware. Will continue to monitor.   0347 Pt has had two runs of SVT. Unable to obtain EKG due to the short time before converting back to atrial paced rhythm with ectopy. See EKG placed on chart as well as printed strips in chart.

## 2021-04-10 NOTE — Progress Notes (Signed)
Patients Pacemaker is not MRI compatible. Exam Cancelled

## 2021-04-11 ENCOUNTER — Inpatient Hospital Stay: Admit: 2021-04-11 | Payer: MEDICARE | Primary: Family

## 2021-04-11 LAB — METABOLIC PANEL, COMPREHENSIVE
A-G Ratio: 0.7 — ABNORMAL LOW (ref 1.1–2.2)
ALT (SGPT): 16 U/L (ref 12–78)
AST (SGOT): 16 U/L (ref 15–37)
Albumin: 2.7 g/dL — ABNORMAL LOW (ref 3.5–5.0)
Alk. phosphatase: 44 U/L — ABNORMAL LOW (ref 45–117)
Anion gap: 2 mmol/L — ABNORMAL LOW (ref 5–15)
BUN/Creatinine ratio: 26 — ABNORMAL HIGH (ref 12–20)
BUN: 30 MG/DL — ABNORMAL HIGH (ref 6–20)
Bilirubin, total: 1.2 MG/DL — ABNORMAL HIGH (ref 0.2–1.0)
CO2: 29 mmol/L (ref 21–32)
Calcium: 9.3 MG/DL (ref 8.5–10.1)
Chloride: 105 mmol/L (ref 97–108)
Creatinine: 1.16 MG/DL — ABNORMAL HIGH (ref 0.55–1.02)
Globulin: 3.8 g/dL (ref 2.0–4.0)
Glucose: 141 mg/dL — ABNORMAL HIGH (ref 65–100)
Potassium: 4.3 mmol/L (ref 3.5–5.1)
Protein, total: 6.5 g/dL (ref 6.4–8.2)
Sodium: 136 mmol/L (ref 136–145)
eGFR: 53 mL/min/{1.73_m2} — ABNORMAL LOW (ref >60)

## 2021-04-11 LAB — CBC WITH AUTOMATED DIFF
ABS. BASOPHILS: 0 10*3/uL (ref 0.0–0.1)
ABS. EOSINOPHILS: 0 10*3/uL (ref 0.0–0.4)
ABS. IMM. GRANS.: 0 10*3/uL (ref 0.00–0.04)
ABS. LYMPHOCYTES: 1 10*3/uL (ref 0.8–3.5)
ABS. MONOCYTES: 0.4 10*3/uL (ref 0.0–1.0)
ABS. NEUTROPHILS: 5.5 10*3/uL (ref 1.8–8.0)
ABSOLUTE NRBC: 0 10*3/uL (ref 0.00–0.01)
BASOPHILS: 0 % (ref 0–1)
EOSINOPHILS: 0 % (ref 0–7)
HCT: 31.4 % — ABNORMAL LOW (ref 35.0–47.0)
HGB: 9.9 g/dL — ABNORMAL LOW (ref 11.5–16.0)
IMMATURE GRANULOCYTES: 0 % (ref 0.0–0.5)
LYMPHOCYTES: 14 % (ref 12–49)
MCH: 29.3 PG (ref 26.0–34.0)
MCHC: 31.5 g/dL (ref 30.0–36.5)
MCV: 92.9 FL (ref 80.0–99.0)
MONOCYTES: 6 % (ref 5–13)
MPV: 11.6 FL (ref 8.9–12.9)
NEUTROPHILS: 80 % — ABNORMAL HIGH (ref 32–75)
NRBC: 0 PER 100 WBC
PLATELET: 52 10*3/uL — ABNORMAL LOW (ref 150–400)
RBC: 3.38 M/uL — ABNORMAL LOW (ref 3.80–5.20)
RDW: 14.8 % — ABNORMAL HIGH (ref 11.5–14.5)
WBC: 6.9 10*3/uL (ref 3.6–11.0)

## 2021-04-11 LAB — EKG, 12 LEAD, INITIAL
Atrial Rate: 42 {beats}/min
Atrial Rate: 85 {beats}/min
Calculated P Axis: 58 degrees
Calculated P Axis: 59 degrees
Calculated R Axis: -116 degrees
Calculated R Axis: -124 degrees
Calculated T Axis: 12 degrees
Calculated T Axis: 8 degrees
P-R Interval: 168 ms
P-R Interval: 184 ms
Q-T Interval: 330 ms
Q-T Interval: 402 ms
QRS Duration: 158 ms
QRS Duration: 168 ms
QTC Calculation (Bezet): 387 ms
QTC Calculation (Bezet): 478 ms
Ventricular Rate: 83 {beats}/min
Ventricular Rate: 85 {beats}/min

## 2021-04-11 LAB — GLUCOSE, POC
Glucose (POC): 137 mg/dL — ABNORMAL HIGH (ref 65–117)
Glucose (POC): 103 mg/dL (ref 65–117)
Glucose (POC): 150 mg/dL — ABNORMAL HIGH (ref 65–117)
Glucose (POC): 134 mg/dL — ABNORMAL HIGH (ref 65–117)

## 2021-04-11 LAB — FOLATE
Folate: 11.1 ng/mL (ref 5.0–21.0)
Folate: 11.1 ng/mL (ref 5.0–21.0)

## 2021-04-11 LAB — MAGNESIUM
Magnesium: 2 mg/dL (ref 1.6–2.4)
Magnesium: 2 mg/dL (ref 1.6–2.4)

## 2021-04-11 LAB — VITAMIN B12
Vitamin B-12: 481 pg/mL (ref 193–986)
Vitamin B12: 481 pg/mL (ref 193–986)

## 2021-04-11 LAB — POCT GLUCOSE
POC Glucose: 137 mg/dL — ABNORMAL HIGH (ref 65–117)
POC Glucose: 103 mg/dL (ref 65–117)
POC Glucose: 150 mg/dL — ABNORMAL HIGH (ref 65–117)
POC Glucose: 134 mg/dL — ABNORMAL HIGH (ref 65–117)

## 2021-04-11 LAB — COMPREHENSIVE METABOLIC PANEL
ALT: 16 U/L (ref 12–78)
AST: 16 U/L (ref 15–37)
Albumin/Globulin Ratio: 0.7 — ABNORMAL LOW (ref 1.1–2.2)
Albumin: 2.7 g/dL — ABNORMAL LOW (ref 3.5–5.0)
Alkaline Phosphatase: 44 U/L — ABNORMAL LOW (ref 45–117)
Anion Gap: 2 mmol/L — ABNORMAL LOW (ref 5–15)
BUN: 30 MG/DL — ABNORMAL HIGH (ref 6–20)
Bun/Cre Ratio: 26 — ABNORMAL HIGH (ref 12–20)
CO2: 29 mmol/L (ref 21–32)
Calcium: 9.3 MG/DL (ref 8.5–10.1)
Chloride: 105 mmol/L (ref 97–108)
Creatinine: 1.16 MG/DL — ABNORMAL HIGH (ref 0.55–1.02)
ESTIMATED GLOMERULAR FILTRATION RATE: 53 mL/min/{1.73_m2} — ABNORMAL LOW (ref >60)
Globulin: 3.8 g/dL (ref 2.0–4.0)
Glucose: 141 mg/dL — ABNORMAL HIGH (ref 65–100)
Potassium: 4.3 mmol/L (ref 3.5–5.1)
Sodium: 136 mmol/L (ref 136–145)
Total Bilirubin: 1.2 MG/DL — ABNORMAL HIGH (ref 0.2–1.0)
Total Protein: 6.5 g/dL (ref 6.4–8.2)

## 2021-04-11 LAB — CBC WITH AUTO DIFFERENTIAL
Basophils %: 0 % (ref 0–1)
Basophils Absolute: 0 10*3/uL (ref 0.0–0.1)
Eosinophils %: 0 % (ref 0–7)
Eosinophils Absolute: 0 10*3/uL (ref 0.0–0.4)
Granulocyte Absolute Count: 0 10*3/uL (ref 0.00–0.04)
Hematocrit: 31.4 % — ABNORMAL LOW (ref 35.0–47.0)
Hemoglobin: 9.9 g/dL — ABNORMAL LOW (ref 11.5–16.0)
Immature Granulocytes: 0 % (ref 0.0–0.5)
Lymphocytes %: 14 % (ref 12–49)
Lymphocytes Absolute: 1 10*3/uL (ref 0.8–3.5)
MCH: 29.3 PG (ref 26.0–34.0)
MCHC: 31.5 g/dL (ref 30.0–36.5)
MCV: 92.9 FL (ref 80.0–99.0)
MPV: 11.6 FL (ref 8.9–12.9)
Monocytes %: 6 % (ref 5–13)
Monocytes Absolute: 0.4 10*3/uL (ref 0.0–1.0)
NRBC Absolute: 0 10*3/uL (ref 0.00–0.01)
Neutrophils %: 80 % — ABNORMAL HIGH (ref 32–75)
Neutrophils Absolute: 5.5 10*3/uL (ref 1.8–8.0)
Nucleated RBCs: 0 PER 100 WBC
Platelets: 52 10*3/uL — ABNORMAL LOW (ref 150–400)
RBC: 3.38 M/uL — ABNORMAL LOW (ref 3.80–5.20)
RDW: 14.8 % — ABNORMAL HIGH (ref 11.5–14.5)
WBC: 6.9 10*3/uL (ref 3.6–11.0)

## 2021-04-11 LAB — EKG 12-LEAD
Atrial Rate: 42 {beats}/min
Atrial Rate: 85 {beats}/min
P Axis: 58 degrees
P Axis: 59 degrees
P-R Interval: 168 ms
P-R Interval: 184 ms
Q-T Interval: 330 ms
Q-T Interval: 402 ms
QRS Duration: 158 ms
QRS Duration: 168 ms
QTc Calculation (Bazett): 387 ms
QTc Calculation (Bazett): 478 ms
R Axis: -116 degrees
R Axis: -124 degrees
T Axis: 12 degrees
T Axis: 8 degrees
Ventricular Rate: 83 {beats}/min
Ventricular Rate: 85 {beats}/min

## 2021-04-11 MED ORDER — AMIODARONE 200 MG TAB
200 mg | Freq: Every day | ORAL | Status: DC
Start: 2021-04-11 — End: 2021-04-15
  Administered 2021-04-12 – 2021-04-15 (×3): via ORAL

## 2021-04-11 MED ORDER — SENNOSIDES-DOCUSATE SODIUM 8.6 MG-50 MG TAB
ORAL | Status: AC
Start: 2021-04-11 — End: 2021-04-11

## 2021-04-11 MED ORDER — OXYCODONE 5 MG TAB
5 mg | Freq: Four times a day (QID) | ORAL | Status: DC | PRN
Start: 2021-04-11 — End: 2021-04-24
  Administered 2021-04-12 – 2021-04-24 (×21): via ORAL

## 2021-04-11 MED ORDER — METOPROLOL SUCCINATE SR 25 MG 24 HR TAB
25 mg | Freq: Every day | ORAL | Status: DC
Start: 2021-04-11 — End: 2021-04-15
  Administered 2021-04-11 – 2021-04-13 (×3): via ORAL

## 2021-04-11 MED FILL — HYDROCORTISONE SOD SUCCINATE (PF) 100 MG/2 ML SOLUTION FOR INJECTION: 100 mg/2 mL | INTRAMUSCULAR | Qty: 2

## 2021-04-11 MED FILL — HEPARIN (PORCINE) 5,000 UNIT/ML IJ SOLN: 5000 unit/mL | INTRAMUSCULAR | Qty: 1

## 2021-04-11 MED FILL — CARAFATE 1 GRAM TABLET: 1 gram | ORAL | Qty: 1

## 2021-04-11 MED FILL — MONTELUKAST 10 MG TAB: 10 mg | ORAL | Qty: 1

## 2021-04-11 MED FILL — INSULIN LISPRO 100 UNIT/ML INJECTION: 100 unit/mL | SUBCUTANEOUS | Qty: 1

## 2021-04-11 MED FILL — AMIODARONE 50 MG/ML IV SOLN: 50 mg/mL | INTRAVENOUS | Qty: 375

## 2021-04-11 MED FILL — CEFTRIAXONE 2 GRAM SOLUTION FOR INJECTION: 2 gram | INTRAMUSCULAR | Qty: 2

## 2021-04-11 MED FILL — CITALOPRAM 20 MG TAB: 20 mg | ORAL | Qty: 2

## 2021-04-11 MED FILL — ACETAMINOPHEN 325 MG TABLET: 325 mg | ORAL | Qty: 2

## 2021-04-11 MED FILL — TRAZODONE 100 MG TAB: 100 mg | ORAL | Qty: 1

## 2021-04-11 MED FILL — BUDESONIDE 0.5 MG/2 ML NEB SUSPENSION: 0.5 mg/2 mL | RESPIRATORY_TRACT | Qty: 1

## 2021-04-11 MED FILL — ATORVASTATIN 10 MG TAB: 10 mg | ORAL | Qty: 1

## 2021-04-11 MED FILL — ARFORMOTEROL 15 MCG/2 ML NEB SOLUTION: 15 mcg/2 mL | RESPIRATORY_TRACT | Qty: 2

## 2021-04-11 MED FILL — ASPIRIN 81 MG CHEWABLE TAB: 81 mg | ORAL | Qty: 1

## 2021-04-11 MED FILL — LIDOCAINE 4 % TOPICAL PATCH (12 HOUR DURATION): 4 % | CUTANEOUS | Qty: 2

## 2021-04-11 MED FILL — LEVOTHYROXINE 75 MCG TAB: 75 mcg | ORAL | Qty: 2

## 2021-04-11 MED FILL — METOPROLOL SUCCINATE SR 25 MG 24 HR TAB: 25 mg | ORAL | Qty: 1

## 2021-04-11 MED FILL — AMIODARONE 200 MG TAB: 200 mg | ORAL | Qty: 1

## 2021-04-11 NOTE — Progress Notes (Signed)
 TRANSFER - OUT REPORT:    Verbal report given to Sudan, RN(name) on Deborah [name]  being transferred to [facility], RN(unit) for routine progression of care       Report consisted of patient's Situation, Background, Assessment and   Recommendations(SBAR).     Information from the following report(s) SBAR, Kardex, Intake/Output, and MAR was reviewed with the receiving nurse.    Lines:   Peripheral IV 04/09/21 Right Hand (Active)   Site Assessment Clean, dry, & intact 04/11/21 1600   Phlebitis Assessment 0 04/11/21 1600   Infiltration Assessment 0 04/11/21 1600   Dressing Status Clean, dry, & intact 04/11/21 1600   Dressing Type Transparent 04/11/21 1600   Hub Color/Line Status Blue;Infusing;Flushed;End cap changed;Patent 04/11/21 1600   Action Taken Open ports on tubing capped 04/11/21 1600   Alcohol Cap Used Yes 04/11/21 1600       Peripheral IV 04/09/21 Left Antecubital (Active)   Site Assessment Clean, dry, & intact 04/11/21 1600   Phlebitis Assessment 0 04/11/21 1600   Infiltration Assessment 0 04/11/21 1600   Dressing Status Clean, dry, & intact 04/11/21 1600   Dressing Type Transparent 04/11/21 1600   Hub Color/Line Status Pink;Flushed;Patent 04/11/21 1600   Action Taken Open ports on tubing capped 04/11/21 1600   Alcohol Cap Used Yes 04/11/21 1600       Extended Dwell Peripheral IV (Active)   Criteria for Appropriate Use Irritant/vesicant medication 04/11/21 1600   Site Assessment Clean, dry, & intact 04/11/21 1600   Phlebitis Assessment 0 04/11/21 1600   Infiltration Assessment 0 04/11/21 1600   Line Status Brisk blood return;Flushed;Infusing 04/11/21 1600   Line Care Connections checked and tightened 04/11/21 1600   Alcohol Cap Used Yes 04/11/21 1600   Date of Last Dressing Change 04/09/21 04/11/21 1600   Dressing Type Transparent 04/11/21 1600   Dressing Status Clean, dry, & intact 04/11/21 1600   Dressing Intervention New dressing 04/09/21 1358       Extended Dwell Peripheral IV (Active)   Criteria for  Appropriate Use Hemodynamically unstable, requiring monitoring lines, vasopressors, or volume resuscitation 04/11/21 1600   Site Assessment Clean, dry, & intact 04/11/21 1600   Phlebitis Assessment 0 04/11/21 1600   Infiltration Assessment 0 04/11/21 1600   Line Status Brisk blood return;Capped;Flushed;Infusing 04/11/21 1600   Line Care Connections checked and tightened 04/11/21 1600   Alcohol Cap Used Yes 04/11/21 1600   Date of Last Dressing Change 04/09/21 04/11/21 1600   Dressing Type Transparent 04/11/21 1600   Dressing Status Clean, dry, & intact 04/11/21 1600   Dressing Intervention New dressing 04/09/21 1722        Opportunity for questions and clarification was provided.      Patient transported with:   Monitor  O2 @ 4 liters  Registered Nurse

## 2021-04-11 NOTE — Progress Notes (Signed)
Patient refused night time bipap.

## 2021-04-11 NOTE — Progress Notes (Signed)
 Nutrition Note    Advanced diet order per IDR discussion     Electronically signed by Sharyne Mania, RD MS on 04/11/2021   Contact: Ext: [phone], or via Perfect[employer]

## 2021-04-11 NOTE — Progress Notes (Signed)
 Case Management note:    RUR 19%    Problems:  SOB  Chest pain  Septic shock  COPD  SVT  PM is not compatible with MRI  GBS bacteremia  Has prosthetic heart valve and pacemaker that is non compatible with MRI    Interventions:  Iv ceftriaxone  Stress dose steroids  Podiatry consultation (left great toe wound)  Per podiatry,GBS might be related to left hallux ulcer  amiodarone   Wound care with hydrogel and dry sterile dressing.  PT/OT evaluations ordered.    Pt lives with her son,Devon & his wife Delon in a one story double wide trailer with 4 entry steps with a railing.  Pt has a wheelchair,rolling walker,cane and shower chair.  Pt plans to talk with her son about installing grab bars as she has difficulty getting in & out of the bathtub.  Pt does not have oxygen.  Pt has a home CPaP she uses also but has had no follow-up since moving to [place] .   Pt uses [facility] .     Once PT/OT have had an opportunity to evaluate pt,I will follow-up with pt to discuss their recommendations.    Elizabeth Aristov,RN

## 2021-04-11 NOTE — Progress Notes (Signed)
 0700- Bedside shift change report given to Devin H (oncoming nurse) by Inocente (offgoing nurse). Report included the following information SBAR, Kardex, ED Summary, Intake/Output, MAR, Recent Results, Cardiac Rhythm Atrial paced, and Alarm Parameters .   Primary Nurse Elston Hand, RN and Inocente, RN performed a dual skin assessment on this patient Impairment noted- see wound doc flow sheet Braden score is 16.    0800- Assessment complete, see doc flowsheets.     [id number] Corean DEL NP contacted by writer regarding patients BP. Metoprolol  held, Amio PO ordered. See MAR. Devin Hantin RN.     1200- Reassessment complete, see doc flowsheets.     1600- Reassessment complete, see doc flowsheets.     1900- Bedside shift change report given to Emmalene (Cabin crew) by Elston DEL (offgoing nurse). Report included the following information SBAR, Kardex, ED Summary, Intake/Output, MAR, Recent Results, Cardiac Rhythm atrial paced, and Alarm Parameters .

## 2021-04-12 LAB — METABOLIC PANEL, BASIC
Anion gap: 3 mmol/L — ABNORMAL LOW (ref 5–15)
BUN/Creatinine ratio: 23 — ABNORMAL HIGH (ref 12–20)
BUN: 27 MG/DL — ABNORMAL HIGH (ref 6–20)
CO2: 28 mmol/L (ref 21–32)
Calcium: 9.2 mg/dL (ref 8.5–10.1)
Chloride: 104 mmol/L (ref 97–108)
Creatinine: 1.18 MG/DL — ABNORMAL HIGH (ref 0.55–1.02)
Glucose: 126 mg/dL — ABNORMAL HIGH (ref 65–100)
Potassium: 4.3 mmol/L (ref 3.5–5.1)
Sodium: 135 mmol/L — ABNORMAL LOW (ref 136–145)
eGFR: 52 mL/min/{1.73_m2} — ABNORMAL LOW (ref >60)

## 2021-04-12 LAB — CULTURE, BLOOD

## 2021-04-12 LAB — GLUCOSE, POC
Glucose (POC): 137 mg/dL — ABNORMAL HIGH (ref 65–117)
Glucose (POC): 105 mg/dL (ref 65–117)
Glucose (POC): 141 mg/dL — ABNORMAL HIGH (ref 65–117)
Glucose (POC): 135 mg/dL — ABNORMAL HIGH (ref 65–117)
Glucose (POC): 142 mg/dL — ABNORMAL HIGH (ref 65–117)

## 2021-04-12 LAB — LACTIC ACID
Lactic Acid: 0.8 MMOL/L (ref 0.4–2.0)
Lactic Acid: 1.7 MMOL/L (ref 0.4–2.0)
Lactic acid: 0.8 MMOL/L (ref 0.4–2.0)
Lactic acid: 1.7 MMOL/L (ref 0.4–2.0)

## 2021-04-12 LAB — CBC WITH AUTOMATED DIFF
ABS. BASOPHILS: 0 10*3/uL (ref 0.0–0.1)
ABS. EOSINOPHILS: 0 10*3/uL (ref 0.0–0.4)
ABS. IMM. GRANS.: 0 10*3/uL
ABS. LYMPHOCYTES: 1.7 10*3/uL (ref 0.8–3.5)
ABS. MONOCYTES: 0.6 10*3/uL (ref 0.0–1.0)
ABS. NEUTROPHILS: 4.8 10*3/uL (ref 1.8–8.0)
ABSOLUTE NRBC: 0 10*3/uL (ref 0.00–0.01)
BASOPHILS: 0 % (ref 0–1)
EOSINOPHILS: 0 % (ref 0–7)
HCT: 32.1 % — ABNORMAL LOW (ref 35.0–47.0)
HGB: 10.3 g/dL — ABNORMAL LOW (ref 11.5–16.0)
IMMATURE GRANULOCYTES: 0 %
LYMPHOCYTES: 24 % (ref 12–49)
MCH: 29.8 pg (ref 26.0–34.0)
MCHC: 32.1 g/dL (ref 30.0–36.5)
MCV: 92.8 fL (ref 80.0–99.0)
MONOCYTES: 8 % (ref 5–13)
MPV: 11.4 fL (ref 8.9–12.9)
NEUTROPHILS: 68 % (ref 32–75)
NRBC: 0 /100{WBCs}
PLATELET: 56 10*3/uL — ABNORMAL LOW (ref 150–400)
RBC: 3.46 M/uL — ABNORMAL LOW (ref 3.80–5.20)
RDW: 14.5 % (ref 11.5–14.5)
WBC: 7.1 10*3/uL (ref 3.6–11.0)

## 2021-04-12 LAB — MAGNESIUM
Magnesium: 1.8 mg/dL (ref 1.6–2.4)
Magnesium: 1.8 mg/dL (ref 1.6–2.4)

## 2021-04-12 LAB — C REACTIVE PROTEIN, QT: C-Reactive protein: 12.4 mg/dL — ABNORMAL HIGH (ref 0.00–0.60)

## 2021-04-12 LAB — PERIPHERAL SMEAR

## 2021-04-12 LAB — BASIC METABOLIC PANEL
Anion Gap: 3 mmol/L — ABNORMAL LOW (ref 5–15)
BUN: 27 MG/DL — ABNORMAL HIGH (ref 6–20)
Bun/Cre Ratio: 23 — ABNORMAL HIGH (ref 12–20)
CO2: 28 mmol/L (ref 21–32)
Calcium: 9.2 MG/DL (ref 8.5–10.1)
Chloride: 104 mmol/L (ref 97–108)
Creatinine: 1.18 MG/DL — ABNORMAL HIGH (ref 0.55–1.02)
ESTIMATED GLOMERULAR FILTRATION RATE: 52 mL/min/{1.73_m2} — ABNORMAL LOW (ref >60)
Glucose: 126 mg/dL — ABNORMAL HIGH (ref 65–100)
Potassium: 4.3 mmol/L (ref 3.5–5.1)
Sodium: 135 mmol/L — ABNORMAL LOW (ref 136–145)

## 2021-04-12 LAB — CBC WITH AUTO DIFFERENTIAL
Basophils %: 0 % (ref 0–1)
Basophils Absolute: 0 10*3/uL (ref 0.0–0.1)
Eosinophils %: 0 % (ref 0–7)
Eosinophils Absolute: 0 10*3/uL (ref 0.0–0.4)
Granulocyte Absolute Count: 0 10*3/uL
Hematocrit: 32.1 % — ABNORMAL LOW (ref 35.0–47.0)
Hemoglobin: 10.3 g/dL — ABNORMAL LOW (ref 11.5–16.0)
Immature Granulocytes: 0 %
Lymphocytes %: 24 % (ref 12–49)
Lymphocytes Absolute: 1.7 10*3/uL (ref 0.8–3.5)
MCH: 29.8 PG (ref 26.0–34.0)
MCHC: 32.1 g/dL (ref 30.0–36.5)
MCV: 92.8 FL (ref 80.0–99.0)
MPV: 11.4 FL (ref 8.9–12.9)
Monocytes %: 8 % (ref 5–13)
Monocytes Absolute: 0.6 10*3/uL (ref 0.0–1.0)
NRBC Absolute: 0 10*3/uL (ref 0.00–0.01)
Neutrophils %: 68 % (ref 32–75)
Neutrophils Absolute: 4.8 10*3/uL (ref 1.8–8.0)
Nucleated RBCs: 0 PER 100 WBC
Platelets: 56 10*3/uL — ABNORMAL LOW (ref 150–400)
RBC: 3.46 M/uL — ABNORMAL LOW (ref 3.80–5.20)
RDW: 14.5 % (ref 11.5–14.5)
WBC: 7.1 10*3/uL (ref 3.6–11.0)

## 2021-04-12 LAB — CULTURE, BLOOD 1

## 2021-04-12 LAB — POCT GLUCOSE
POC Glucose: 137 mg/dL — ABNORMAL HIGH (ref 65–117)
POC Glucose: 105 mg/dL (ref 65–117)
POC Glucose: 141 mg/dL — ABNORMAL HIGH (ref 65–117)
POC Glucose: 135 mg/dL — ABNORMAL HIGH (ref 65–117)
POC Glucose: 142 mg/dL — ABNORMAL HIGH (ref 65–117)

## 2021-04-12 LAB — C-REACTIVE PROTEIN: CRP: 12.4 mg/dL — ABNORMAL HIGH (ref 0.00–0.60)

## 2021-04-12 MED FILL — ACETAMINOPHEN 325 MG TABLET: 325 mg | ORAL | Qty: 2

## 2021-04-12 MED FILL — BUDESONIDE 0.5 MG/2 ML NEB SUSPENSION: 0.5 mg/2 mL | RESPIRATORY_TRACT | Qty: 1

## 2021-04-12 MED FILL — METOPROLOL SUCCINATE SR 25 MG 24 HR TAB: 25 mg | ORAL | Qty: 1

## 2021-04-12 MED FILL — TRAZODONE 50 MG TAB: 50 mg | ORAL | Qty: 2

## 2021-04-12 MED FILL — CITALOPRAM 20 MG TAB: 20 mg | ORAL | Qty: 2

## 2021-04-12 MED FILL — ASPIRIN 81 MG CHEWABLE TAB: 81 mg | ORAL | Qty: 1

## 2021-04-12 MED FILL — CARAFATE 1 GRAM TABLET: 1 gram | ORAL | Qty: 1

## 2021-04-12 MED FILL — HEPARIN (PORCINE) 5,000 UNIT/ML IJ SOLN: 5000 unit/mL | INTRAMUSCULAR | Qty: 1

## 2021-04-12 MED FILL — LIDOCAINE 4 % TOPICAL PATCH (12 HOUR DURATION): 4 % | CUTANEOUS | Qty: 2

## 2021-04-12 MED FILL — ATORVASTATIN 10 MG TAB: 10 mg | ORAL | Qty: 1

## 2021-04-12 MED FILL — ARFORMOTEROL 15 MCG/2 ML NEB SOLUTION: 15 mcg/2 mL | RESPIRATORY_TRACT | Qty: 2

## 2021-04-12 MED FILL — OXYCODONE 5 MG TAB: 5 mg | ORAL | Qty: 1

## 2021-04-12 MED FILL — AMIODARONE 200 MG TAB: 200 mg | ORAL | Qty: 1

## 2021-04-12 MED FILL — CEFTRIAXONE 2 GRAM SOLUTION FOR INJECTION: 2 gram | INTRAMUSCULAR | Qty: 2

## 2021-04-12 MED FILL — MONTELUKAST 10 MG TAB: 10 mg | ORAL | Qty: 1

## 2021-04-12 MED FILL — LEVOTHYROXINE 75 MCG TAB: 75 mcg | ORAL | Qty: 2

## 2021-04-12 NOTE — Progress Notes (Signed)
 Care Management follow up    Patient admitted for Septic shock, AKI/CKD, acute hypoxic respiratory failure, gram positive bacteremia. Left great toe wound.    History of: AVR, pacemaker, COPD, diabetes, CHD, depression.    RUR 18 (Score %) moderate   Is This a Readmission NO  Is this a Bundle NO    Current status  Patient discussed during interdisciplinary rounds.  Patient continues to require medical management including ongoing assessment and monitoring.  Continues on oxygen at 3L/NC, nebs, IV antibiotics. Await PT/OT evaluation. Podiatry and ID following. Will need PICC placed prior to DC for IV antibiotic therapy post discharge.    Transition of Care Plan  Monitor patient status and response to treatment.  Patient continues to require medical management.  CM needs: home IV antibiotics  Await PT/OT evaluations  Left great toe wound  Will need home IV antibiotics set up.  CM to monitor progress and recommendations.    Glendale Girt, RN, MSN/Care manager

## 2021-04-12 NOTE — Progress Notes (Signed)
Problem: Falls - Risk of  Goal: *Absence of Falls  Description: Document Bridgette Habermann Fall Risk and appropriate interventions in the flowsheet.  Outcome: Progressing Towards Goal  Note: Fall Risk Interventions:                                Problem: Falls - Risk of  Goal: *Absence of Falls  Description: Document Schmid Fall Risk and appropriate interventions in the flowsheet.  Outcome: Progressing Towards Goal  Note: Fall Risk Interventions:                                Problem: Pressure Injury - Risk of  Goal: *Prevention of pressure injury  Description: Document Braden Scale and appropriate interventions in the flowsheet.  Outcome: Progressing Towards Goal  Note: Pressure Injury Interventions:  Sensory Interventions: Assess changes in LOC    Moisture Interventions: Absorbent underpads    Activity Interventions: Assess need for specialty bed    Mobility Interventions: Assess need for specialty bed    Nutrition Interventions: Document food/fluid/supplement intake    Friction and Shear Interventions: Apply protective barrier, creams and emollients

## 2021-04-12 NOTE — Progress Notes (Signed)
 VAT Note:   PICC order remains active. ID not stating patient needing PICC at discharge. Natalia,RN to confirm with hospitalist on rounds today if PICC still needed and discontinue order if not needed.    1055 Confirmed with Dr. Levern ID will need 2 weeks of Antbx at discharge. Will place close to discharge.

## 2021-04-12 NOTE — Progress Notes (Signed)
 Bedside and Verbal shift change report given to Natalia (Cabin crew) by Sudan RN (offgoing nurse). Report included the following information SBAR, Intake/Output, MAR, and Recent Results.

## 2021-04-12 NOTE — Progress Notes (Signed)
 Problem: Falls - Risk of  Goal: *Absence of Falls  Description: Document Deloris Fall Risk and appropriate interventions in the flowsheet.  Outcome: Progressing Towards Goal  Note: Fall Risk Interventions:                                Problem: Pressure Injury - Risk of  Goal: *Prevention of pressure injury  Description: Document Braden Scale and appropriate interventions in the flowsheet.  Outcome: Progressing Towards Goal  Note: Pressure Injury Interventions:  Sensory Interventions: Assess changes in LOC    Moisture Interventions: Absorbent underpads    Activity Interventions: Assess need for specialty bed    Mobility Interventions: Assess need for specialty bed    Nutrition Interventions: Document food/fluid/supplement intake    Friction and Shear Interventions: Apply protective barrier, creams and emollients                Problem: Sepsis: Day 4  Goal: Off Pathway (Use only if patient is Off Pathway)  Outcome: Progressing Towards Goal  Goal: Activity/Safety  Outcome: Progressing Towards Goal  Goal: Consults, if ordered  Outcome: Progressing Towards Goal  Goal: Diagnostic Test/Procedures  Outcome: Progressing Towards Goal  Goal: Nutrition/Diet  Outcome: Progressing Towards Goal  Goal: Discharge Planning  Outcome: Progressing Towards Goal  Goal: Medications  Outcome: Progressing Towards Goal  Goal: Respiratory  Outcome: Progressing Towards Goal  Goal: Treatments/Interventions/Procedures  Outcome: Progressing Towards Goal  Goal: Psychosocial  Outcome: Progressing Towards Goal  Goal: *Oxygen saturation within defined limits  Outcome: Progressing Towards Goal  Goal: *Hemodynamically stable  Outcome: Progressing Towards Goal  Goal: *Vital signs within defined limits  Outcome: Progressing Towards Goal  Goal: *Tolerating diet  Outcome: Progressing Towards Goal  Goal: *Demonstrates progressive activity  Outcome: Progressing Towards Goal  Goal: *Fluid volume maintenance  Outcome: Progressing Towards Goal

## 2021-04-12 NOTE — Progress Notes (Signed)
 Problem: Mobility Impaired (Adult and Pediatric)  Goal: *Acute Goals and Plan of Care (Insert Text)  Description: FUNCTIONAL STATUS PRIOR TO ADMISSION: Patient was modified independent using a single point cane for functional mobility.    HOME SUPPORT PRIOR TO ADMISSION: The patient lived with family but did not require assist.    Physical Therapy Goals  Initiated 04/12/2021  1.  Patient will move from supine to sit and sit to supine , scoot up and down, and roll side to side in bed with independence within 7 day(s).    2.  Patient will transfer from bed to chair and chair to bed with modified independence using the least restrictive device within 7 day(s).  3.  Patient will perform sit to stand with modified independence within 7 day(s).  4.  Patient will ambulate with modified independence for 50 feet with the least restrictive device within 7 day(s).   5.  Patient will ascend/descend 4 stairs with R handrail(s) with supervision/set-up within 7 day(s).    Outcome: Progressing Towards Goal   PHYSICAL THERAPY EVALUATION  Patient: Deborah [name] (66 y.o. female)  Date: 04/12/2021  Primary Diagnosis: Systemic inflammatory response syndrome (SIRS) (HCC) [R65.10]       Precautions: Fall, WBAT LLE with surgical shoe       ASSESSMENT  Based on the objective data described below, the patient presents with systemic inflammation, morbid obesity, left foot pain, bilateral knee pain, dyspnea on exertion, significant mobility impairment, and decreased activity tolerance due to these symptoms which lead to pt functioning below baseline at this time. Patient was received in semi fowler's on O2 2L via NC with bandaging on R great toe from wound care. Patient was issued surgical shoe per podiatry and was assisted with donning footwear. Patient requires min assist of hand hold to perform supine to sit transfer, and requires minimal assistance throughout tranfers and ambulation. Ambulates ~35 feet with min A using RW with a slow  antalgic gait while remaining on O2 x2L. Requests to return to EOB due to dyspnea and bilateral knee pain. Patient does not have hx of O2 use in home, but reports CPAP while sleeping. Vitals remained stable in normal range throughout session. Discharge recommendation pending performance throughout admission, currently recommend discharge to rehab vs HHPT with increased assistance.     Current Level of Function Impacting Discharge (mobility/balance): Requires RW for ambulation, requires min A for mobility, DOE.    Functional Outcome Measure:  The patient scored 16/24 on the AMPAC outcome measure which is indicative of continued need for skilled therapy upon d/c following discharge from acute setting.      Other factors to consider for discharge: Poor activity tolerance     Patient will benefit from skilled therapy intervention to address the above noted impairments.       PLAN :  Recommendations and Planned Interventions: bed mobility training, transfer training, gait training, therapeutic exercises, neuromuscular re-education, orthotic/prosthetic training, modalities, edema management/control, patient and family training/education, and therapeutic activities      Frequency/Duration: Patient will be followed by physical therapy:  5 times a week to address goals.    Recommendation for discharge: (in order for the patient to meet his/her long term goals)  Therapy up to 5 days/week in SNF setting If not rehab, home with HHPT and additional support    This discharge recommendation:  Has not yet been discussed the attending provider and/or case management    IF patient discharges home will need the following DME:  to be determined (TBD)   O2?         SUBJECTIVE:   Patient stated "I need to sit down." I can make it back to the bed.    OBJECTIVE DATA SUMMARY:   HISTORY:    Past Medical History:   Diagnosis Date    (HFpEF) heart failure with preserved ejection fraction (HCC)     Anxiety and depression     Aortic valve  replaced     S/p bovine aortic valve replacement.    Asthma     Chronic narcotic use     Chronic obstructive pulmonary disease (HCC)     Chronic pain     CKD (chronic kidney disease), stage III (HCC)     Baseline creatinine is 1.3-1.4 with GFR in the 40s.    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)     GERD (gastroesophageal reflux disease)     Hyperlipidemia     Hypothyroidism     Morbid obesity (HCC)     Neuropathy     Obstructive sleep apnea     Rhinitis      Past Surgical History:   Procedure Laterality Date    COLONOSCOPY N/A 12/01/2020    COLONOSCOPY performed by Jama Sayre, MD at [facility] ENDOSCOPY    HX AORTIC VALVE REPLACEMENT      Bovine bioprosthetic    HX PACEMAKER         Personal factors and/or comorbidities impacting plan of care: Asthma, HFpEF, morbid obesity, DOE, decreased activity tolerance, CKD    Home Situation  Home Environment: Private residence  # Steps to Enter: 4  Rails to Enter: Yes  Hand Rails : Right  Wheelchair Ramp: No  One/Two Story Residence: One story  Living Alone: No  Support Systems: Child(ren), Other Family Member(s)  Patient Expects to be Discharged to:: Home with home health  Current DME Used/Available at Home: Cane, straight, CPAP, Wheelchair, Environmental consultant, rolling, Tub transfer bench, Shower chair  Tub or Shower Type: Tub/Shower combination    EXAMINATION/PRESENTATION/DECISION MAKING:   Critical Behavior:  Neurologic State: Alert  Orientation Level: Oriented X4  Cognition: Follows commands  Safety/Judgement: Awareness of environment, Fall prevention  Hearing:  Auditory  Auditory Impairment: None      Range Of Motion:  AROM: Generally decreased, functional           PROM: Generally decreased, functional           Strength:    Strength: Generally decreased, functional                    Tone & Sensation:   Tone: Normal              Sensation: Impaired (mildly reduced on L LE)               Coordination:  Coordination: Within functional limits  Vision:      Functional Mobility:  Bed  Mobility:  Rolling: Minimum assistance  Supine to Sit: Minimum assistance  Sit to Supine: Stand-by assistance  Scooting: Minimum assistance  Transfers:  Sit to Stand: Minimum assistance  Stand to Sit: Minimum assistance                       Balance:   Sitting: Intact (prop sit)  Standing: With support;Intact (RW)  Standing - Static: Good;Occasional  Standing - Dynamic : Fair;Constant support (pain limited (knees))  Ambulation/Gait Training:  Distance (ft): 35 Feet (ft)  Assistive Device: Gait belt;Walker, rolling  Ambulation - Level of Assistance: Minimal assistance     Gait Description (WDL): Exceptions to WDL  Gait Abnormalities: Altered arm swing;Decreased step clearance;Trunk sway increased;Antalgic        Base of Support: Widened     Speed/Cadence: Pace decreased (<100 feet/min);Slow  Step Length: Left shortened;Right shortened  Swing Pattern: Left symmetrical;Right symmetrical             Therapeutic Exercises:   See functional mobility    Functional Measure:  [facility] AM-PAC      Basic Mobility Inpatient Short Form (6-Clicks) Version 2  How much HELP from another person do you currently need... (If the patient hasn't done an activity recently, how much help from another person do you think they would need if they tried?) Total A Lot A Little None   1.  Turning from your back to your side while in a flat bed without using bedrails? []   1 []   2 [x]   3  []   4   2.  Moving from lying on your back to sitting on the side of a flat bed without using bedrails? []   1 [x]   2 []   3  []   4   3.  Moving to and from a bed to a chair (including a wheelchair)? []   1 []   2 [x]   3  []   4   4. Standing up from a chair using your arms (e.g. wheelchair or bedside chair)? []   1 []   2 [x]   3  []   4   5.  Walking in hospital room? []   1 []   2 [x]   3  []   4   6.  Climbing 3-5 steps with a railing? []   1 [x]   2 []   3  []   4     Raw Score: 16/24                            Cutoff score ?171,2,3 had higher odds of discharging  home with home health or need of SNF/IPR.    1. Diane U. Jette, Ronal Broody, Vinoth K. Ranganathan, Sandra D. Passek, Gilmore RAMAN. Waldemar Dale EMERSON Aneta.  Validity of the AM-PAC "6-Clicks" Inpatient Daily Activity and Basic Mobility Short Forms. Physical Therapy Mar 2014, [phone]) [phone]; DOI: 10.2522/ptj.20130199  2. Warren M, Knecht J, Verheijde J, Tompkins J. Association of AM-PAC 6-Clicks Basic Mobility and Daily Activity Scores With Discharge Destination. Phys Ther. 2021 Apr 4;101(4):pzab043. doi: 10.1093/ptj/pzab043. PMID: 66482536.  3. Herbold J, Rajaraman D, Waddell RAMAN, Agayby K, [place] S. Activity Measure for Post-Acute Care 6-Clicks Basic Mobility Scores Predict Discharge Destination After Acute Care Hospitalization in Select Patient Groups: A Retrospective, Observational Study. Arch Rehabil Res Clin Transl. 2022 Jul 16;4(3):100204. doi: 10.1016/j.arrct.7977.899795. PMID: 63876017; PMCID: EFR0517973.  4. Aneta DELENA Darryle RAMAN, Coster W, Ni P. AM-PAC Short Forms Manual 4.0. Revised 02/2018.         Physical Therapy Evaluation Charge Determination   History Examination Presentation Decision-Making   HIGH Complexity :3+ comorbidities / personal factors will impact the outcome/ POC  MEDIUM Complexity : 3 Standardized tests and measures addressing body structure, function, activity limitation and / or participation in recreation  MEDIUM Complexity : Evolving with changing characteristics  Other outcome measures AMPAC  MEDIUM      Based on the above components, the patient evaluation is determined to be of the following complexity level:  MEDIUM    Pain Rating:  Not rated  Endorses knee pain leading to need to sit with ambulation    Activity Tolerance:   Poor, requires frequent rest breaks, and observed SOB with activity    After treatment patient left in no apparent distress:   Supine in bed, Heels elevated for pressure relief, Call bell within reach, Bed / chair alarm activated, and Side rails x  3    COMMUNICATION/EDUCATION:   The patient's plan of care was discussed with: Registered nurse and Case management.     Fall prevention education was provided and the patient/caregiver indicated understanding., Patient/family have participated as able in goal setting and plan of care., and Patient/family agree to work toward stated goals and plan of care.    Thank you for this referral.  Charlie LITTIE Part, PT, DPT   Time Calculation: 28 mins

## 2021-04-12 NOTE — Progress Notes (Signed)
 Problem: Self Care Deficits Care Plan (Adult)  Goal: *Acute Goals and Plan of Care (Insert Text)  Description: FUNCTIONAL STATUS PRIOR TO ADMISSION: Patient was modified independent using a single point cane for functional mobility. Patient was modified independent for basic and instrumental ADLs. No baseline O2 use.     HOME SUPPORT: The patient lived with her son and his family. Pt reports being home alone during the day.    Occupational Therapy Goals  Initiated 04/12/2021  1.  Patient will perform grooming, standing at sink, with modified independence within 7 day(s).  2.  Patient will perform lower body dressing with supervision/set-up using AD PRN within 7 day(s).  3.  Patient will perform bathing with supervision/set-up within 7 day(s).  4.  Patient will perform toilet transfers with modified independence within 7 day(s).  5.  Patient will perform all aspects of toileting with modified independence within 7 day(s).  6.  Patient will participate in upper extremity therapeutic exercise/activities with independence for 10 minutes within 7 day(s).    7.  Patient will utilize energy conservation techniques during functional activities with verbal cues within 7 day(s).   Outcome: Progressing Towards Goal     OCCUPATIONAL THERAPY EVALUATION  Patient: Deborah [name] (66 y.o. female)  Date: 04/12/2021  Primary Diagnosis: Systemic inflammatory response syndrome (SIRS) (HCC) [R65.10]       Precautions:       ASSESSMENT  Based on the objective data described below, the patient presents with decreased activity tolerance and endurance impacting ADL performance and mobility following admission for SIRS. Pt today reports significant fatigue from working with PT earlier this AM and therefore only agreeable to bed level activity. She demonstrates fair strength and is able to manage UB ADLs with set up to min A. Based on observations, infer pt is requiring increased assistance for LB ADLs. She is received on 2L supplemental O2 via  nasal cannula with saturations ranging between 86%-92% with bed -level activity. She is educated extensively on PLB and maintaining activity tolerance while in acute setting and indicates understanding. Pending progress in acute setting, pt may require short rehab stay prior to discharging home, however reports goal of discharging home with [facility].    Current Level of Function Impacting Discharge (ADLs/self-care): up to mod A for LB ADLs; Min A for ADL transfers    Functional Outcome Measure:  The patient scored 16/24 on the AM-PAC outcome measure which is indicative of increased likelihood for follow up skilled OT services.      Other factors to consider for discharge: pt is alone during the day; supplemental O2 needs     Patient will benefit from skilled therapy intervention to address the above noted impairments.       PLAN :  Recommendations and Planned Interventions: self care training, functional mobility training, therapeutic exercise, balance training, therapeutic activities, endurance activities, patient education, home safety training, and family training/education    Frequency/Duration: Patient will be followed by occupational therapy 5 times a week to address goals.    Recommendation for discharge: (in order for the patient to meet his/her long term goals)  To be determined: home with [facility] and increased support vs. Rehab     This discharge recommendation:  Has not yet been discussed the attending provider and/or case management    IF patient discharges home will need the following DME: TBD       SUBJECTIVE:   Patient stated "I just feel so tired."    OBJECTIVE DATA SUMMARY:  HISTORY:   Past Medical History:   Diagnosis Date    (HFpEF) heart failure with preserved ejection fraction (HCC)     Anxiety and depression     Aortic valve replaced     S/p bovine aortic valve replacement.    Asthma     Chronic narcotic use     Chronic obstructive pulmonary disease (HCC)     Chronic pain     CKD (chronic kidney  disease), stage III (HCC)     Baseline creatinine is 1.3-1.4 with GFR in the 40s.    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)     GERD (gastroesophageal reflux disease)     Hyperlipidemia     Hypothyroidism     Morbid obesity (HCC)     Neuropathy     Obstructive sleep apnea     Rhinitis      Past Surgical History:   Procedure Laterality Date    COLONOSCOPY N/A 12/01/2020    COLONOSCOPY performed by Jama Sayre, MD at [facility] ENDOSCOPY    HX AORTIC VALVE REPLACEMENT      Bovine bioprosthetic    HX PACEMAKER         Expanded or extensive additional review of patient history:     Home Situation  Home Environment: Private residence  # Steps to Enter: 4  Rails to Enter: Yes  Hand Rails : Right  Wheelchair Ramp: No  One/Two Story Residence: One story  Living Alone: No  Support Systems: Child(ren), Other Family Member(s)  Patient Expects to be Discharged to:: Home with home health  Current DME Used/Available at Home: Cane, straight, CPAP, Wheelchair, Environmental consultant, rolling, Tub transfer bench, Shower chair  Tub or Shower Type: Tub/Shower combination    Hand dominance: Right    EXAMINATION OF PERFORMANCE DEFICITS:  Cognitive/Behavioral Status:  Neurologic State: Alert  Orientation Level: Oriented X4  Cognition: Follows commands  Perception: Appears intact  Perseveration: No perseveration noted  Safety/Judgement: Awareness of environment;Fall prevention    Hearing:  Auditory  Auditory Impairment: None    Range of Motion:  AROM: Generally decreased, functional  PROM: Generally decreased, functional    Strength:  Strength: Generally decreased, functional     Coordination:  Coordination: Within functional limits  Fine Motor Skills-Upper: Left Intact;Right Intact    Gross Motor Skills-Upper: Left Intact;Right Intact    Tone & Sensation:  Tone: Normal  Sensation: Impaired (mildly reduced on L LE)    Balance:  Sitting: Intact (prop sit)  Standing: With support;Intact (RW)  Standing - Static: Good;Occasional  Standing - Dynamic : Fair;Constant  support (pain limited (knees))    Functional Mobility and Transfers for ADLs:  Bed Mobility:  Rolling: Minimum assistance  Supine to Sit: Minimum assistance  Sit to Supine: Stand-by assistance  Scooting: Minimum assistance    Transfers:  Sit to Stand: Minimum assistance  Stand to Sit: Minimum assistance    ADL Assessment:  Feeding: Independent    Oral Facial Hygiene/Grooming: Setup;Supervision    Bathing: Minimum assistance    Upper Body Dressing: Setup;Minimum assistance    Lower Body Dressing: Moderate assistance    Toileting: Moderate assistance    ADL Intervention and task modifications:    Cognitive Retraining  Safety/Judgement: Awareness of environment;Fall prevention    Instructed pt on pursed lip breathing (PLB) to assist with maintaining oxygen saturations >90% during activity and at rest. Pt instructed to inhale through nose and exhale through mouth while creating O shape with verbal, tactile, and visual cues  provided to increase clarity and carry-over of technique. Instructed pt to complete focused PLB during rest breaks in order to increase ease while performing functional tasks.     Functional Measure:  [facility]      Daily Activity Inpatient Short Form (6-Clicks) Version 2  How much HELP from another person do you currently need... (If the patient hasn't done an activity recently, how much help from another person do you think they would need if they tried?) Total A Lot A Little None   1.  Putting on and taking off regular lower body clothing? []    1 [x]    2 []    3 []    4   2.  Bathing (including washing, rinsing, drying)? []    1 [x]    2 []    3 []    4   3.  Toileting, which includes using toilet, bedpan, or urinal? []    1 [x]    2 []    3 []    4   4.  Putting on and taking off regular upper body clothing? []    1 []    2 [x]    3 []    4   5.  Taking care of personal grooming such as brushing teeth? []    1 []    2 [x]    3 []    4   6.  Eating meals? []    1 []    2 []    3 [x]    4     Raw Score:  16/24                            Cutoff score ?191,2,3 had higher odds of discharging home with home health or need of SNF/IPR    1. Diane U. Jette, Ronal Broody, Vinoth K. Ranganathan, Sandra D. Passek, Gilmore RAMAN. Waldemar Dale EMERSON Aneta.  Validity of the AM-PAC "6-Clicks" Inpatient Daily Activity and Basic Mobility Short Forms. Physical Therapy Mar 2014, [phone]) 379-391; DOI: 10.2522/ptj.20130199  2. Warren M, Knecht J, Verheijde J, Tompkins J. Association of AM-PAC 6-Clicks Basic Mobility and Daily Activity Scores With Discharge Destination. Phys Ther. 2021 Apr 4;101(4):pzab043. doi: 10.1093/ptj/pzab043. PMID: 66482536.  3. Herbold J, Rajaraman D, Waddell RAMAN, Agayby K, [place] S. Activity Measure for Post-Acute Care 6-Clicks Basic Mobility Scores Predict Discharge Destination After Acute Care Hospitalization in Select Patient Groups: A Retrospective, Observational Study. Arch Rehabil Res Clin Transl. 2022 Jul 16;4(3):100204. doi: 10.1016/j.arrct.7977.899795. PMID: 63876017; PMCID: EFR0517973.  4. Aneta DELENA Darryle RAMAN, Coster W, Ni P. AM-PAC Short Forms Manual 4.0. Revised 02/2018.  Occupational Therapy Evaluation Charge Determination   History Examination Decision-Making   LOW Complexity : Brief history review  MEDIUM Complexity : 3-5 performance deficits relating to physical, cognitive , or psychosocial skils that result in activity limitations and / or participation restrictions MEDIUM Complexity : Patient may present with comorbidities that affect occupational performnce. Miniml to moderate modification of tasks or assistance (eg, physical or verbal ) with assesment(s) is necessary to enable patient to complete evaluation       Based on the above components, the patient evaluation is determined to be of the following complexity level: LOW   Pain Rating:  Pt reporting no pain    Activity Tolerance:   Fair and desaturates with exertion and requires oxygen    After treatment patient left in no apparent distress:     Supine in bed, Side rails x 3, and RN present  COMMUNICATION/EDUCATION:   The patient's plan of care was discussed with: Physical therapist and Registered nurse.     Home safety education was provided and the patient/caregiver indicated understanding., Patient/family have participated as able in goal setting and plan of care., and Patient/family agree to work toward stated goals and plan of care.    This patient's plan of care is appropriate for delegation to OTA.    Thank you for this referral.  Samantha D Lade, OT  Time Calculation: 9 mins

## 2021-04-13 ENCOUNTER — Inpatient Hospital Stay: Admit: 2021-04-13 | Payer: MEDICARE | Primary: Family

## 2021-04-13 ENCOUNTER — Inpatient Hospital Stay: Admit: 2021-04-14 | Payer: MEDICARE | Primary: Family

## 2021-04-13 LAB — CBC WITH AUTOMATED DIFF
ABS. BASOPHILS: 0 10*3/uL (ref 0.0–0.1)
ABS. EOSINOPHILS: 0.1 10*3/uL (ref 0.0–0.4)
ABS. IMM. GRANS.: 0.1 10*3/uL — ABNORMAL HIGH (ref 0.00–0.04)
ABS. LYMPHOCYTES: 2.1 10*3/uL (ref 0.8–3.5)
ABS. MONOCYTES: 0.9 10*3/uL (ref 0.0–1.0)
ABS. NEUTROPHILS: 4.2 10*3/uL (ref 1.8–8.0)
ABSOLUTE NRBC: 0 10*3/uL (ref 0.00–0.01)
BASOPHILS: 0 % (ref 0–1)
EOSINOPHILS: 2 % (ref 0–7)
HCT: 31.2 % — ABNORMAL LOW (ref 35.0–47.0)
HGB: 10 g/dL — ABNORMAL LOW (ref 11.5–16.0)
IMMATURE GRANULOCYTES: 1 % — ABNORMAL HIGH (ref 0.0–0.5)
LYMPHOCYTES: 28 % (ref 12–49)
MCH: 29.5 pg (ref 26.0–34.0)
MCHC: 32.1 g/dL (ref 30.0–36.5)
MCV: 92 fL (ref 80.0–99.0)
MONOCYTES: 12 % (ref 5–13)
MPV: 11.6 fL (ref 8.9–12.9)
NEUTROPHILS: 57 % (ref 32–75)
NRBC: 0 /100{WBCs}
PLATELET: 56 10*3/uL — ABNORMAL LOW (ref 150–400)
RBC: 3.39 M/uL — ABNORMAL LOW (ref 3.80–5.20)
RDW: 14.3 % (ref 11.5–14.5)
WBC COMMENTS: REACTIVE
WBC: 7.4 10*3/uL (ref 3.6–11.0)

## 2021-04-13 LAB — ECHO TEE W OR WO CONTRAST
AV Area by Peak Velocity: 0.7 cm2
AV Area by VTI: 0.6 cm2
AV Mean Gradient: 42 mmHg
AV Mean Velocity: 3.1 m/s
AV Peak Gradient: 71 mmHg
AV Peak Velocity: 4.2 m/s
AV VTI: 118.4 cm
AV Velocity Ratio: 0.24
AVA/BSA Peak Velocity: 0.3 cm2/m2
AVA/BSA VTI: 0.2 cm2/m2
LVOT Area: 3.1 cm2
LVOT Diameter: 2 cm
LVOT Mean Gradient: 2 mmHg
LVOT Peak Gradient: 4 mmHg
LVOT Peak Velocity: 1 m/s
LVOT SV: 71.3 ml
LVOT Stroke Volume Index: 28 mL/m2
LVOT VTI: 22.7 cm
LVOT:AV VTI Index: 0.19
TR Max Velocity: 3.82 m/s
TR Peak Gradient: 58 mmHg

## 2021-04-13 LAB — GLUCOSE, POC
Glucose (POC): 110 mg/dL (ref 65–117)
Glucose (POC): 109 mg/dL (ref 65–117)
Glucose (POC): 145 mg/dL — ABNORMAL HIGH (ref 65–117)

## 2021-04-13 LAB — METABOLIC PANEL, BASIC
Anion gap: 5 mmol/L (ref 5–15)
BUN/Creatinine ratio: 23 — ABNORMAL HIGH (ref 12–20)
BUN: 23 MG/DL — ABNORMAL HIGH (ref 6–20)
CO2: 25 mmol/L (ref 21–32)
Calcium: 9.1 mg/dL (ref 8.5–10.1)
Chloride: 103 mmol/L (ref 97–108)
Creatinine: 1 mg/dL (ref 0.55–1.02)
Glucose: 120 mg/dL — ABNORMAL HIGH (ref 65–100)
Potassium: 4.1 mmol/L (ref 3.5–5.1)
Sodium: 133 mmol/L — ABNORMAL LOW (ref 136–145)
eGFR: >60 mL/min/{1.73_m2} (ref >60)

## 2021-04-13 LAB — MAGNESIUM
Magnesium: 1.9 mg/dL (ref 1.6–2.4)
Magnesium: 1.9 mg/dL (ref 1.6–2.4)

## 2021-04-13 LAB — LACTIC ACID
Lactic Acid: 0.8 MMOL/L (ref 0.4–2.0)
Lactic acid: 0.8 MMOL/L (ref 0.4–2.0)

## 2021-04-13 LAB — BASIC METABOLIC PANEL
Anion Gap: 5 mmol/L (ref 5–15)
BUN: 23 MG/DL — ABNORMAL HIGH (ref 6–20)
Bun/Cre Ratio: 23 — ABNORMAL HIGH (ref 12–20)
CO2: 25 mmol/L (ref 21–32)
Calcium: 9.1 MG/DL (ref 8.5–10.1)
Chloride: 103 mmol/L (ref 97–108)
Creatinine: 1 MG/DL (ref 0.55–1.02)
ESTIMATED GLOMERULAR FILTRATION RATE: >60 mL/min/{1.73_m2} (ref >60)
Glucose: 120 mg/dL — ABNORMAL HIGH (ref 65–100)
Potassium: 4.1 mmol/L (ref 3.5–5.1)
Sodium: 133 mmol/L — ABNORMAL LOW (ref 136–145)

## 2021-04-13 LAB — CBC WITH AUTO DIFFERENTIAL
Basophils %: 0 % (ref 0–1)
Basophils Absolute: 0 10*3/uL (ref 0.0–0.1)
Eosinophils %: 2 % (ref 0–7)
Eosinophils Absolute: 0.1 10*3/uL (ref 0.0–0.4)
Granulocyte Absolute Count: 0.1 10*3/uL — ABNORMAL HIGH (ref 0.00–0.04)
Hematocrit: 31.2 % — ABNORMAL LOW (ref 35.0–47.0)
Hemoglobin: 10 g/dL — ABNORMAL LOW (ref 11.5–16.0)
Immature Granulocytes: 1 % — ABNORMAL HIGH (ref 0.0–0.5)
Lymphocytes %: 28 % (ref 12–49)
Lymphocytes Absolute: 2.1 10*3/uL (ref 0.8–3.5)
MCH: 29.5 PG (ref 26.0–34.0)
MCHC: 32.1 g/dL (ref 30.0–36.5)
MCV: 92 FL (ref 80.0–99.0)
MPV: 11.6 FL (ref 8.9–12.9)
Monocytes %: 12 % (ref 5–13)
Monocytes Absolute: 0.9 10*3/uL (ref 0.0–1.0)
NRBC Absolute: 0 10*3/uL (ref 0.00–0.01)
Neutrophils %: 57 % (ref 32–75)
Neutrophils Absolute: 4.2 10*3/uL (ref 1.8–8.0)
Nucleated RBCs: 0 PER 100 WBC
Platelets: 56 10*3/uL — ABNORMAL LOW (ref 150–400)
RBC: 3.39 M/uL — ABNORMAL LOW (ref 3.80–5.20)
RDW: 14.3 % (ref 11.5–14.5)
WBC Comment: REACTIVE
WBC: 7.4 10*3/uL (ref 3.6–11.0)

## 2021-04-13 LAB — TEE (PRN CONTRAST/BUBBLE/3D)
AV Area by Peak Velocity: 0.7 cm2
AV Area by VTI: 0.6 cm2
AV Mean Gradient: 42 mmHg
AV Mean Velocity: 3.1 m/s
AV Peak Gradient: 71 mmHg
AV Peak Velocity: 4.2 m/s
AV VTI: 118.4 cm
AV Velocity Ratio: 0.24
AVA/BSA Peak Velocity: 0.3 cm2/m2
AVA/BSA VTI: 0.2 cm2/m2
LVOT Area: 3.1 cm2
LVOT Diameter: 2 cm
LVOT Mean Gradient: 2 mmHg
LVOT Peak Gradient: 4 mmHg
LVOT Peak Velocity: 1 m/s
LVOT SV: 71.3 ml
LVOT Stroke Volume Index: 28 mL/m2
LVOT VTI: 22.7 cm
LVOT:AV VTI Index: 0.19
TR Max Velocity: 3.82 m/s
TR Peak Gradient: 58 mmHg

## 2021-04-13 LAB — POCT GLUCOSE
POC Glucose: 110 mg/dL (ref 65–117)
POC Glucose: 109 mg/dL (ref 65–117)
POC Glucose: 145 mg/dL — ABNORMAL HIGH (ref 65–117)

## 2021-04-13 MED ORDER — WATER FOR INJECTION, STERILE INJECTION
2 mg | INTRAMUSCULAR | Status: DC | PRN
Start: 2021-04-13 — End: 2021-04-24

## 2021-04-13 MED ORDER — LIDOCAINE 5 % TOPICAL OINTMENT
5 % | CUTANEOUS | Status: DC | PRN
Start: 2021-04-13 — End: 2021-04-13
  Administered 2021-04-13: 13:00:00 via TOPICAL

## 2021-04-13 MED ORDER — BACTERIOSTATIC SALINE 0.9 % IJ SOLN
0.9 % | INTRAMUSCULAR | Status: DC | PRN
Start: 2021-04-13 — End: 2021-04-13

## 2021-04-13 MED ORDER — MIDAZOLAM 1 MG/ML IJ SOLN
1 mg/mL | INTRAMUSCULAR | Status: DC | PRN
Start: 2021-04-13 — End: 2021-04-13
  Administered 2021-04-13: 14:00:00 via INTRAVENOUS

## 2021-04-13 MED ORDER — LIDOCAINE 2 % MUCOSAL SOLN
2 % | Status: DC | PRN
Start: 2021-04-13 — End: 2021-04-13
  Administered 2021-04-13: 13:00:00 via OROMUCOSAL

## 2021-04-13 MED ORDER — LIDOCAINE 2 % MUCOUS MEMBRANE JELLY IN APPLICATOR
2 % | Freq: Once | Status: AC
Start: 2021-04-13 — End: 2021-04-13
  Administered 2021-04-13: 14:00:00

## 2021-04-13 MED ORDER — FENTANYL CITRATE (PF) 50 MCG/ML IJ SOLN
50 mcg/mL | INTRAMUSCULAR | Status: DC | PRN
Start: 2021-04-13 — End: 2021-04-13
  Administered 2021-04-13: 14:00:00 via INTRAVENOUS

## 2021-04-13 MED ORDER — BENZOCAINE 20 % MUCOSAL AEROSOL SPRAY
20 % | Status: DC | PRN
Start: 2021-04-13 — End: 2021-04-13
  Administered 2021-04-13: 13:00:00

## 2021-04-13 MED FILL — LIDOCAINE 5 % TOPICAL OINTMENT: 5 % | CUTANEOUS | Qty: 0.14

## 2021-04-13 MED FILL — LEVOTHYROXINE 75 MCG TAB: 75 mcg | ORAL | Qty: 2

## 2021-04-13 MED FILL — ACETAMINOPHEN 325 MG TABLET: 325 mg | ORAL | Qty: 2

## 2021-04-13 MED FILL — ATORVASTATIN 10 MG TAB: 10 mg | ORAL | Qty: 1

## 2021-04-13 MED FILL — ARFORMOTEROL 15 MCG/2 ML NEB SOLUTION: 15 mcg/2 mL | RESPIRATORY_TRACT | Qty: 2

## 2021-04-13 MED FILL — MIDAZOLAM 1 MG/ML IJ SOLN: 1 mg/mL | INTRAMUSCULAR | Qty: 10

## 2021-04-13 MED FILL — TRAZODONE 50 MG TAB: 50 mg | ORAL | Qty: 2

## 2021-04-13 MED FILL — CATHFLO ACTIVASE 2 MG INTRA-CATHETER SOLUTION: 2 mg | Qty: 1

## 2021-04-13 MED FILL — BACTERIOSTATIC SALINE 0.9 % IJ SOLN: 0.9 % | INTRAMUSCULAR | Qty: 10

## 2021-04-13 MED FILL — CARAFATE 1 GRAM TABLET: 1 gram | ORAL | Qty: 1

## 2021-04-13 MED FILL — MONTELUKAST 10 MG TAB: 10 mg | ORAL | Qty: 1

## 2021-04-13 MED FILL — FENTANYL CITRATE (PF) 50 MCG/ML IJ SOLN: 50 mcg/mL | INTRAMUSCULAR | Qty: 4

## 2021-04-13 MED FILL — HEPARIN (PORCINE) 5,000 UNIT/ML IJ SOLN: 5000 unit/mL | INTRAMUSCULAR | Qty: 1

## 2021-04-13 MED FILL — BUDESONIDE 0.5 MG/2 ML NEB SUSPENSION: 0.5 mg/2 mL | RESPIRATORY_TRACT | Qty: 1

## 2021-04-13 MED FILL — ASPIRIN 81 MG CHEWABLE TAB: 81 mg | ORAL | Qty: 1

## 2021-04-13 MED FILL — CITALOPRAM 20 MG TAB: 20 mg | ORAL | Qty: 2

## 2021-04-13 MED FILL — LIDOCAINE 2 % MUCOUS MEMBRANE JELLY IN APPLICATOR: 2 % | Qty: 10

## 2021-04-13 MED FILL — METOPROLOL SUCCINATE SR 25 MG 24 HR TAB: 25 mg | ORAL | Qty: 1

## 2021-04-13 MED FILL — AMIODARONE 200 MG TAB: 200 mg | ORAL | Qty: 1

## 2021-04-13 MED FILL — OXYCODONE 5 MG TAB: 5 mg | ORAL | Qty: 1

## 2021-04-13 MED FILL — CEFTRIAXONE 2 GRAM SOLUTION FOR INJECTION: 2 gram | INTRAMUSCULAR | Qty: 2

## 2021-04-13 MED FILL — LIDOCAINE 2 % MUCOSAL SOLN: 2 % | Qty: 15

## 2021-04-13 NOTE — Progress Notes (Signed)
 Spoke with Dr. Levern regarding timeline for PICC line placement and he is requesting we proceed with placement today.

## 2021-04-13 NOTE — Progress Notes (Signed)
 Rapid Called at 2213    Responded to RRT at 2214 for Chest Pain    Provider at bedside: yes    Interventions ordered: Labs and EKG    Sepsis Suspected: yes    Transfer to Higher Level of Care: NO    Pt had chest pain at pace maker area. Provider is at bedside assessing pt. Lab and ECG are ordered. Pain med is also ordered. VS taken.    Visit Vitals  BP (!) 99/47   Pulse 82   Temp 100.1 F (37.8 C)   Resp 20   Ht 5' 7 (1.702 m)   Wt 155.9 kg (343 lb 11.2 oz)   SpO2 99%   Breastfeeding No   BMI 53.83 kg/m        Rapid Ended at 2222    RRT RN assisted with transport to accepting unit N/A.    Mitzie Dutton, RN

## 2021-04-13 NOTE — Progress Notes (Signed)
 PICC Placement Note    PRE-PROCEDURE VERIFICATION  Correct Procedure: yes  Correct Site:  yes  Temperature: Temp: 97.7 F (36.5 C), Temperature Source: Temp Source: Oral  Recent Labs     04/13/21  0049   BUN 23*   CREA 1.00   PLT 56*   WBC 7.4     Allergies: Nitroglycerin, Aloe vera, Hydrochlorothiazide, and Tetanus and diphther. tox (pf)  Education materials for PICC Care given: yes. See Patient Education activity for further details.  PICC Booklet placed at bedside: yes    Closed Ended PICC Catheters:  Flush Lumens as Follows:  Intermittent Medication:   Flush before and after each medication with 10 ml NS.   Unused Ports:  Flush every 8 hours with 10 ml NS.  TPN Ports:  Flush every 24 hours with 20 ml NS prior to hanging new bag.  Blood Draws: Stop infusion, draw off and waste 10 ml of blood. Draw sample with 10cc syringe or greater. DO NOT USE VACUTAINER . Transfer with appropriate device to lab  tubes. Flush with 20 ml NS.  Dressing Change:  Every 7 days, and PRN using sterile technique if integrity of dressing is compromised.  Initial dressing change for central line 24-48 hours post insertion if gauze is used. Apply new dressing per policy.    PROCEDURE DETAIL  Consent was obtained and all questions were answered related to risks and benefits.   A single lumen PICC line was inserted, as a sterile procedure using ultrasound and modified Seldinger technique for antibiotic therapy. The following documentation is in addition to the PICC properties in the lines/airways flowsheet :  Lot #: [id number]  Lidocaine  1% administered intradermally :yes  Internal Catheter Total Length: 48 (cm)  Vein Selection for PICC:right cephalic  Central Line Bundle followed yes  Complication Related to Insertion:no    The placement was verified by EKG, MAX P WAVE @ 48 (cm) with 1 (cm) out. PER EKG PICC TIP @ C/A junction.   The arm circumference is 40 (cm)     X-ray: no. Line is okay to use: yes    Julie A Goddard, RN

## 2021-04-13 NOTE — Progress Notes (Signed)
1610: Per MD, okay to give meds with sip of water.     Bedside shift change report given to Wynona Canes RN (oncoming nurse) by Verdie Drown (offgoing nurse). Report included the following information SBAR, Intake/Output, MAR, Recent Results, and Cardiac Rhythm a-paced .

## 2021-04-13 NOTE — Progress Notes (Signed)
 VAT- Acknowledged order for PICC placement. Repeat blood cultures 3/16 and TEE planned. Will continue to follow chart for discharge planing. Please call ext. [phone] with any questions or concerns.

## 2021-04-13 NOTE — Progress Notes (Signed)
 Bedside and Verbal shift change report given to Comptroller  (oncoming nurse) by Sudan RN (offgoing nurse). Report included the following information SBAR, Intake/Output, MAR, and Recent Results.

## 2021-04-13 NOTE — Progress Notes (Signed)
Progress Notes by Jackquline Berlin, OTA at 04/13/21 1541                Author: Jackquline Berlin, OTA  Service: Occupational Therapy  Author Type: Occupational Therapy Assistant       Filed: 04/13/21 1542  Date of Service: 04/13/21 1541  Status: Signed           Editor: Sonnie Alamo (Occupational Therapy Assistant)  Cosigner: Marca Ancona, OT at 06/01/21 1255               Occupational Therapy Note: Pt stated she just finished receiving PICC line and preferred not to "move around right now." Will defer PT/OT today.

## 2021-04-13 NOTE — Progress Notes (Signed)
 TRANSFER - OUT REPORT:    Verbal report given to Abbigale, RN(name) on Deborah [name] being transferred to PCC(unit) for routine post - op       Report consisted of patient's Situation, Background, Assessment and   Recommendations(SBAR).     Information from the following report(s) Procedure Summary was reviewed with the receiving nurse.    Opportunity for questions and clarification was provided.      Patient transported with:   Monitor  O2 @ 3 liters  Tech

## 2021-04-13 NOTE — Progress Notes (Signed)
 Patient has been identified as appropriate for SNF placement. SNF rehab discussed with patient.  SNF providers discussed, patient states she prefers a facility near her home in [place]. [facility] is [facility] or [employer].  Referral sent via [employer].  Freedom of Choice obtained.  Await response.    Care Management Interventions  PCP Verified by CM: Yes [facility])  Transition of Care Consult (CM Consult): SNF  Partner SNF: No  Reason Why Partner SNF Not Chosen:  (patientr choice)  Support Systems: Child(ren), Other Family Member(s)  Confirm Follow Up Transport: Family  The Plan for Transition of Care is Related to the Following Treatment Goals : discharge  The Patient and/or Patient Representative was Provided with a Choice of Provider and Agrees with the Discharge Plan?: (S) Yes  Name of the Patient Representative Who was Provided with a Choice of Provider and Agrees with the Discharge Plan: Nilaya Bouie  Freedom of Choice List was Provided with Basic Dialogue that Supports the Patient's Individualized Plan of Care/Goals, Treatment Preferences and Shares the Quality Data Associated with the Providers?: (S) Yes  Discharge Location  Patient Expects to be Discharged to:: Skilled nursing facility    Glendale Girt, RN, MSN/Care manager

## 2021-04-13 NOTE — Progress Notes (Signed)
 Patient c/o L side chest pain.Called  Rapid response.  RRT and provider at bedside. EKG, labs and pain meds ordered.

## 2021-04-14 ENCOUNTER — Inpatient Hospital Stay: Admit: 2021-04-14 | Payer: MEDICARE | Primary: Family

## 2021-04-14 LAB — SAMPLES BEING HELD

## 2021-04-14 LAB — CBC WITH AUTOMATED DIFF
ABS. BASOPHILS: 0 10*3/uL (ref 0.0–0.1)
ABS. EOSINOPHILS: 0.2 10*3/uL (ref 0.0–0.4)
ABS. IMM. GRANS.: 0.1 10*3/uL — ABNORMAL HIGH (ref 0.00–0.04)
ABS. LYMPHOCYTES: 1.5 10*3/uL (ref 0.8–3.5)
ABS. MONOCYTES: 0.8 10*3/uL (ref 0.0–1.0)
ABS. NEUTROPHILS: 5.1 10*3/uL (ref 1.8–8.0)
ABSOLUTE NRBC: 0 10*3/uL (ref 0.00–0.01)
BASOPHILS: 0 % (ref 0–1)
EOSINOPHILS: 2 % (ref 0–7)
HCT: 29.2 % — ABNORMAL LOW (ref 35.0–47.0)
HGB: 9.3 g/dL — ABNORMAL LOW (ref 11.5–16.0)
IMMATURE GRANULOCYTES: 2 % — ABNORMAL HIGH (ref 0.0–0.5)
LYMPHOCYTES: 20 % (ref 12–49)
MCH: 29.4 PG (ref 26.0–34.0)
MCHC: 31.8 g/dL (ref 30.0–36.5)
MCV: 92.4 FL (ref 80.0–99.0)
MONOCYTES: 10 % (ref 5–13)
MPV: 12.1 FL (ref 8.9–12.9)
NEUTROPHILS: 66 % (ref 32–75)
NRBC: 0 PER 100 WBC
PLATELET: 77 10*3/uL — ABNORMAL LOW (ref 150–400)
RBC: 3.16 M/uL — ABNORMAL LOW (ref 3.80–5.20)
RDW: 14 % (ref 11.5–14.5)
WBC: 7.7 10*3/uL (ref 3.6–11.0)

## 2021-04-14 LAB — RESPIRATORY VIRUS PANEL W/COVID-19, PCR
Adenovirus: NOT DETECTED
Adenovirus: NOT DETECTED
B. parapertussis, PCR: NOT DETECTED
BORDETELLA PARAPERTUSSIS PCR, BORPAR: NOT DETECTED
Bordetella pertussis - PCR: NOT DETECTED
Bordetella pertussis by PCR: NOT DETECTED
CORONAVIRUS 229E: NOT DETECTED
CORONAVIRUS HKU1: NOT DETECTED
CORONAVIRUS NL63: NOT DETECTED
CORONAVIRUS OC43: NOT DETECTED
Chlamydophila pneumoniae DNA, QL, PCR: NOT DETECTED
Chlamydophilia pneumoniae by PCR: NOT DETECTED
Coronavirus 229E: NOT DETECTED
Coronavirus CVNL63: NOT DETECTED
Coronavirus HKU1: NOT DETECTED
Coronavirus OC43: NOT DETECTED
INFLUENZA A: NOT DETECTED
INFLUENZA B: NOT DETECTED
Influenza A: NOT DETECTED
Influenza B: NOT DETECTED
Metapneumovirus: NOT DETECTED
Metapneumovirus: NOT DETECTED
Mycoplasma pneumoniae DNA, QL, PCR: NOT DETECTED
Mycoplasma pneumoniae by PCR: NOT DETECTED
PARAINFLUENZA 4: NOT DETECTED
Parainfluenza 1: NOT DETECTED
Parainfluenza 1: NOT DETECTED
Parainfluenza 2: NOT DETECTED
Parainfluenza 2: NOT DETECTED
Parainfluenza 3: NOT DETECTED
Parainfluenza 3: NOT DETECTED
Parainfluenza virus 4: NOT DETECTED
RSV by PCR: NOT DETECTED
RSV by PCR: NOT DETECTED
Rhinovirus Enterovirus PCR: NOT DETECTED
Rhinovirus and Enterovirus: NOT DETECTED
SARS Coronavirus-2: NOT DETECTED
SARS-CoV-2, PCR: NOT DETECTED

## 2021-04-14 LAB — METABOLIC PANEL, BASIC
Anion gap: 5 mmol/L (ref 5–15)
Anion gap: 5 mmol/L (ref 5–15)
BUN/Creatinine ratio: 18 (ref 12–20)
BUN/Creatinine ratio: 20 (ref 12–20)
BUN: 20 MG/DL (ref 6–20)
BUN: 22 MG/DL — ABNORMAL HIGH (ref 6–20)
CO2: 30 mmol/L (ref 21–32)
CO2: 30 mmol/L (ref 21–32)
Calcium: 9 MG/DL (ref 8.5–10.1)
Calcium: 9.1 MG/DL (ref 8.5–10.1)
Chloride: 101 mmol/L (ref 97–108)
Chloride: 100 mmol/L (ref 97–108)
Creatinine: 1.09 MG/DL — ABNORMAL HIGH (ref 0.55–1.02)
Creatinine: 1.1 MG/DL — ABNORMAL HIGH (ref 0.55–1.02)
Glucose: 143 mg/dL — ABNORMAL HIGH (ref 65–100)
Glucose: 132 mg/dL — ABNORMAL HIGH (ref 65–100)
Potassium: 4.2 mmol/L (ref 3.5–5.1)
Potassium: 4.3 mmol/L (ref 3.5–5.1)
Sodium: 136 mmol/L (ref 136–145)
Sodium: 135 mmol/L — ABNORMAL LOW (ref 136–145)
eGFR: 57 mL/min/{1.73_m2} — ABNORMAL LOW (ref >60)
eGFR: 56 mL/min/{1.73_m2} — ABNORMAL LOW (ref >60)

## 2021-04-14 LAB — POC G3 - PUL
Allens test (POC): POSITIVE
Base Excess, Arterial: 2.1 mmol/L
Base excess (POC): 2.1 mmol/L
FIO2 (POC): 3 %
FIO2: 3 %
HCO3 (POC): 28.3 MMOL/L — ABNORMAL HIGH (ref 22–26)
HCO3, Art: 28.3 MMOL/L — ABNORMAL HIGH (ref 22–26)
POC Allen's Test: POSITIVE
POC O2 SAT: 94.9 % (ref 92–97)
pCO2 (POC): 51.2 MMHG — ABNORMAL HIGH (ref 35.0–45.0)
pCO2, Art: 51.2 MMHG — ABNORMAL HIGH (ref 35.0–45.0)
pH (POC): 7.35 (ref 7.35–7.45)
pH, Art: 7.35 (ref 7.35–7.45)
pO2 (POC): 80 MMHG (ref 80–100)
pO2, Art: 80 MMHG (ref 80–100)
sO2 (POC): 94.9 % (ref 92–97)

## 2021-04-14 LAB — GLUCOSE, POC
Glucose (POC): 140 mg/dL — ABNORMAL HIGH (ref 65–117)
Glucose (POC): 153 mg/dL — ABNORMAL HIGH (ref 65–117)
Glucose (POC): 181 mg/dL — ABNORMAL HIGH (ref 65–117)
Glucose (POC): 160 mg/dL — ABNORMAL HIGH (ref 65–117)
Glucose (POC): 204 mg/dL — ABNORMAL HIGH (ref 65–117)

## 2021-04-14 LAB — CULTURE, BLOOD, PAIRED
Culture result:: NO GROWTH
Culture: NO GROWTH

## 2021-04-14 LAB — TROPONIN-HIGH SENSITIVITY: Troponin-High Sensitivity: 19 ng/L (ref 0–51)

## 2021-04-14 LAB — LACTIC ACID
Lactic Acid: 0.9 MMOL/L (ref 0.4–2.0)
Lactic acid: 0.9 MMOL/L (ref 0.4–2.0)

## 2021-04-14 LAB — CBC WITH AUTO DIFFERENTIAL
Basophils %: 0 % (ref 0–1)
Basophils Absolute: 0 10*3/uL (ref 0.0–0.1)
Eosinophils %: 2 % (ref 0–7)
Eosinophils Absolute: 0.2 10*3/uL (ref 0.0–0.4)
Granulocyte Absolute Count: 0.1 10*3/uL — ABNORMAL HIGH (ref 0.00–0.04)
Hematocrit: 29.2 % — ABNORMAL LOW (ref 35.0–47.0)
Hemoglobin: 9.3 g/dL — ABNORMAL LOW (ref 11.5–16.0)
Immature Granulocytes: 2 % — ABNORMAL HIGH (ref 0.0–0.5)
Lymphocytes %: 20 % (ref 12–49)
Lymphocytes Absolute: 1.5 10*3/uL (ref 0.8–3.5)
MCH: 29.4 PG (ref 26.0–34.0)
MCHC: 31.8 g/dL (ref 30.0–36.5)
MCV: 92.4 FL (ref 80.0–99.0)
MPV: 12.1 FL (ref 8.9–12.9)
Monocytes %: 10 % (ref 5–13)
Monocytes Absolute: 0.8 10*3/uL (ref 0.0–1.0)
NRBC Absolute: 0 10*3/uL (ref 0.00–0.01)
Neutrophils %: 66 % (ref 32–75)
Neutrophils Absolute: 5.1 10*3/uL (ref 1.8–8.0)
Nucleated RBCs: 0 PER 100 WBC
Platelets: 77 10*3/uL — ABNORMAL LOW (ref 150–400)
RBC: 3.16 M/uL — ABNORMAL LOW (ref 3.80–5.20)
RDW: 14 % (ref 11.5–14.5)
WBC: 7.7 10*3/uL (ref 3.6–11.0)

## 2021-04-14 LAB — BASIC METABOLIC PANEL
Anion Gap: 5 mmol/L (ref 5–15)
Anion Gap: 5 mmol/L (ref 5–15)
BUN: 20 MG/DL (ref 6–20)
BUN: 22 MG/DL — ABNORMAL HIGH (ref 6–20)
Bun/Cre Ratio: 18 (ref 12–20)
Bun/Cre Ratio: 20 (ref 12–20)
CO2: 30 mmol/L (ref 21–32)
CO2: 30 mmol/L (ref 21–32)
Calcium: 9 MG/DL (ref 8.5–10.1)
Calcium: 9.1 MG/DL (ref 8.5–10.1)
Chloride: 101 mmol/L (ref 97–108)
Chloride: 100 mmol/L (ref 97–108)
Creatinine: 1.09 MG/DL — ABNORMAL HIGH (ref 0.55–1.02)
Creatinine: 1.1 MG/DL — ABNORMAL HIGH (ref 0.55–1.02)
ESTIMATED GLOMERULAR FILTRATION RATE: 57 mL/min/{1.73_m2} — ABNORMAL LOW (ref >60)
ESTIMATED GLOMERULAR FILTRATION RATE: 56 mL/min/{1.73_m2} — ABNORMAL LOW (ref >60)
Glucose: 143 mg/dL — ABNORMAL HIGH (ref 65–100)
Glucose: 132 mg/dL — ABNORMAL HIGH (ref 65–100)
Potassium: 4.2 mmol/L (ref 3.5–5.1)
Potassium: 4.3 mmol/L (ref 3.5–5.1)
Sodium: 136 mmol/L (ref 136–145)
Sodium: 135 mmol/L — ABNORMAL LOW (ref 136–145)

## 2021-04-14 LAB — POCT GLUCOSE
POC Glucose: 140 mg/dL — ABNORMAL HIGH (ref 65–117)
POC Glucose: 153 mg/dL — ABNORMAL HIGH (ref 65–117)
POC Glucose: 181 mg/dL — ABNORMAL HIGH (ref 65–117)
POC Glucose: 160 mg/dL — ABNORMAL HIGH (ref 65–117)
POC Glucose: 204 mg/dL — ABNORMAL HIGH (ref 65–117)

## 2021-04-14 LAB — TROPONIN, HIGH SENSITIVITY: Troponin, High Sensitivity: 19 ng/L (ref 0–51)

## 2021-04-14 MED ORDER — SODIUM CHLORIDE 0.9% BOLUS IV
0.9 % | Freq: Once | INTRAVENOUS | Status: AC
Start: 2021-04-14 — End: 2021-04-14
  Administered 2021-04-14: 13:00:00 via INTRAVENOUS

## 2021-04-14 MED ORDER — IOPAMIDOL 76 % IV SOLN
370 mg iodine /mL (76 %) | Freq: Once | INTRAVENOUS | Status: AC
Start: 2021-04-14 — End: 2021-04-14
  Administered 2021-04-14: 14:00:00 via INTRAVENOUS

## 2021-04-14 MED ORDER — VIAL2BAG ADAPTOR (20 MM)
750 mg | Freq: Two times a day (BID) | Status: DC
Start: 2021-04-14 — End: 2021-04-16
  Administered 2021-04-15 – 2021-04-16 (×4): via INTRAVENOUS

## 2021-04-14 MED ORDER — DEXAMETHASONE SODIUM PHOSPHATE (PF) 10 MG/ML INJECTION
10 mg/mL | INTRAMUSCULAR | Status: DC
Start: 2021-04-14 — End: 2021-04-15
  Administered 2021-04-14: 16:00:00 via INTRAVENOUS

## 2021-04-14 MED ORDER — VANCOMYCIN IN 0.9 % SODIUM CHLORIDE 2.5 GRAM/500 ML IV
2.5 gram/500 mL | Freq: Once | INTRAVENOUS | Status: AC
Start: 2021-04-14 — End: 2021-04-14
  Administered 2021-04-14: 15:00:00 via INTRAVENOUS

## 2021-04-14 MED ORDER — HYDROMORPHONE 0.5 MG/0.5 ML SYRINGE
0.5 mg/ mL | INTRAMUSCULAR | Status: DC | PRN
Start: 2021-04-14 — End: 2021-04-24
  Administered 2021-04-14 – 2021-04-23 (×4): via INTRAVENOUS

## 2021-04-14 MED FILL — LIDOCAINE 4 % TOPICAL PATCH (12 HOUR DURATION): 4 % | CUTANEOUS | Qty: 2

## 2021-04-14 MED FILL — CARAFATE 1 GRAM TABLET: 1 gram | ORAL | Qty: 1

## 2021-04-14 MED FILL — LEVOTHYROXINE 75 MCG TAB: 75 mcg | ORAL | Qty: 2

## 2021-04-14 MED FILL — HEPARIN (PORCINE) 5,000 UNIT/ML IJ SOLN: 5000 unit/mL | INTRAMUSCULAR | Qty: 1

## 2021-04-14 MED FILL — ARFORMOTEROL 15 MCG/2 ML NEB SOLUTION: 15 mcg/2 mL | RESPIRATORY_TRACT | Qty: 2

## 2021-04-14 MED FILL — ACETAMINOPHEN 325 MG TABLET: 325 mg | ORAL | Qty: 2

## 2021-04-14 MED FILL — ATORVASTATIN 10 MG TAB: 10 mg | ORAL | Qty: 1

## 2021-04-14 MED FILL — AMIODARONE 200 MG TAB: 200 mg | ORAL | Qty: 1

## 2021-04-14 MED FILL — INSULIN LISPRO 100 UNIT/ML INJECTION: 100 unit/mL | SUBCUTANEOUS | Qty: 1

## 2021-04-14 MED FILL — HYDROMORPHONE 0.5 MG/0.5 ML SYRINGE: 0.5 mg/ mL | INTRAMUSCULAR | Qty: 0.5

## 2021-04-14 MED FILL — TRAZODONE 50 MG TAB: 50 mg | ORAL | Qty: 2

## 2021-04-14 MED FILL — DEXAMETHASONE SODIUM PHOSPHATE (PF) 10 MG/ML INJECTION: 10 mg/mL | INTRAMUSCULAR | Qty: 1

## 2021-04-14 MED FILL — SODIUM CHLORIDE 0.9 % IV: INTRAVENOUS | Qty: 500

## 2021-04-14 MED FILL — VANCOMYCIN 10 GRAM IV SOLR: 10 gram | INTRAVENOUS | Qty: 2500

## 2021-04-14 MED FILL — BUDESONIDE 0.5 MG/2 ML NEB SUSPENSION: 0.5 mg/2 mL | RESPIRATORY_TRACT | Qty: 1

## 2021-04-14 MED FILL — CITALOPRAM 20 MG TAB: 20 mg | ORAL | Qty: 2

## 2021-04-14 MED FILL — CATHFLO ACTIVASE 2 MG INTRA-CATHETER SOLUTION: 2 mg | Qty: 1

## 2021-04-14 MED FILL — CEFTRIAXONE 2 GRAM SOLUTION FOR INJECTION: 2 gram | INTRAMUSCULAR | Qty: 2

## 2021-04-14 MED FILL — OXYCODONE 5 MG TAB: 5 mg | ORAL | Qty: 1

## 2021-04-14 MED FILL — ASPIRIN 81 MG CHEWABLE TAB: 81 mg | ORAL | Qty: 1

## 2021-04-14 MED FILL — MONTELUKAST 10 MG TAB: 10 mg | ORAL | Qty: 1

## 2021-04-14 NOTE — Progress Notes (Signed)
 This patient was assisted with Intentional Toileting every 2 hours during this shift as appropriate.  Documentation of ambulation and output reflected on Flowsheet as appropriate.  Purposeful hourly rounding was completed using AIDET and 5Ps.  Outcomes of PHR documented as they occurred. Bed alarm in use as appropriate.  Dual Suction and ambubag in place.     -Janit Pagan, PCT

## 2021-04-14 NOTE — Progress Notes (Signed)
 Rapid Called at 0908    Responded to RRT at 0909 for Altered mental status    Provider at bedside: yes    Interventions ordered: ABG and Other (Comment)    Sepsis Suspected: yes    Transfer to Higher Level of Care: ICU    Blood Glucose: 181     Visit Vitals  BP 113/69   Pulse 81   Temp (!) 100.6 F (38.1 C)   Resp 24   Ht 5' 7 (1.702 m)   Wt 155.4 kg (342 lb 11.2 oz)   SpO2 96%   Breastfeeding No   BMI 53.67 kg/m        Rapid Ended at 1030    Bolus of fluid ordered for low blood pressures, CTA, ABGs and Bipap to be used as CPAP.  Patient uses CPAP at home, but has not used it in the last 4 days per patient report.    RRT RN assisted with transport to accepting unit yes.  Tranported to STAT CTA and then to ICU.      CTA revealed bilateral ground glass opacifications / pneumonia, bibasilar atelectasis with bilateral pleural effusions and cardiomegaly.    Burnard Del, RN

## 2021-04-14 NOTE — Progress Notes (Signed)
 1900: Bedside and Verbal shift change report given to Asberry, Charity fundraiser (oncoming nurse) by Rocky, RN (offgoing nurse). Report included the following information SBAR, Kardex, Procedure Summary, Intake/Output, MAR, Accordion, Recent Results, Med Rec Status, Cardiac Rhythm NSR, and Alarm Parameters .  Advised patient has been lethargic all day but attributes it to lack of sleep. RRT work up this AM negative except for ground glass opacities  noted on chest CTA.     1930: Shift assessment complete, patient AXOX4, drowsy/sleepy, states pain in left upper back, denies SOB. Medicated for pain with lidoderm  patches. Repositioned for comfort.     2230: Patient is complaining of feeling like she is having a hard time breathing (assessment unchanged and new set of vitals just charted), she has been sleepy all day but states she is feeling more sleepy. I held her trazadone and oxycodone  (she asked for it for pain 9/10 in her back and side). She seems to be more alert the longer I am in the room, but falls ight back to sleep even mid sentence.  She has OSA and wears CPAP at home but has been refusing it inpatient because she can't tolerate our masks. In House APP advised, ABG ordered.    2323: ABG completed, orders received to place on BIPAP, patient agreeable to wearing it for now.     0031:Reassessment complete, BIPAP in place, appears to be tolerating well, responds appropriately to voice.     0200: Per RT, ordered ABG not needed at this time, APP advised, states okay to redraw in AM    0330: Provider notified ABG results in chart, appears slightly increase in CO2 retention. Orders per chart.     0500: Reassessment complete, bath and linen change, patient appears more alert and communicating with staff.Encouraged to keep BIPAP on as needed. Verbalized understanding.     Bedside and Verbal shift change report given to Rocky, Charity fundraiser (Cabin crew) by Asberry, RN (offgoing nurse). Report included the following information SBAR,  Kardex, Procedure Summary, Intake/Output, MAR, Accordion, Recent Results, Med Rec Status, Cardiac Rhythm Atrial Paced, and Alarm Parameters .

## 2021-04-14 NOTE — Progress Notes (Signed)
 Rt called to draw ABG for somnolent  pt. ABG values supported non-invasive therapy. Order placed, RT placed BIPAP on pt, titrated  to 18/10 to compensate for audible snoring. RT to follow

## 2021-04-14 NOTE — Progress Notes (Signed)
Problem: Falls - Risk of  Goal: *Absence of Falls  Description: Document Deborah [name] Fall Risk and appropriate interventions in the flowsheet.  Outcome: Progressing Towards Goal  Note: Fall Risk Interventions:                                Problem: Patient Education: Go to Patient Education Activity  Goal: Patient/Family Education  Outcome: Progressing Towards Goal     Problem: Falls - Risk of  Goal: *Absence of Falls  Description: Document Deborah [name] Fall Risk and appropriate interventions in the flowsheet.  Outcome: Progressing Towards Goal  Note: Fall Risk Interventions:

## 2021-04-14 NOTE — Progress Notes (Signed)
 [facility] Dosing Services: Antimicrobial Stewardship Daily Doc  Consult for antibiotic dosing of Vancomycin by Dr. Malcom  Indication: Sepsis  Day of Therapy: 1    Ht Readings from Last 1 Encounters:   04/13/21 170.2 cm (67)        Wt Readings from Last 1 Encounters:   04/14/21 155.4 kg (342 lb 11.2 oz)      Vancomycin therapy:  Loading dose: Vancomycin 2500 mg IV x1 dose now  Maintenance dose: Vancomycin 750 mg IV q12hr   Dose calculated to approximate a           a. Target AUC/MIC of 400-600          b. Trough of N/A   Last level: Will order a level within 24hrs of 1st maintenance dose  Plan: Continue same  Dose administration notes:   Doses given appropriately as scheduled    Other Antimicrobial   (not dosed by pharmacist) Rocephin 2 gm IV q24hr   Cultures 3/17: Blood: NG x12hr pending  3/17: Blood: NG x 2 days pending  3/13: Blood: NG final   Serum Creatinine Lab Results   Component Value Date/Time    Creatinine 1.10 (H) 04/14/2021 02:52 AM         Creatinine Clearance Estimated Creatinine Clearance: 80.8 mL/min (A) (based on SCr of 1.1 mg/dL (H)).     Temp Temp: (!) 100.6 F (38.1 C)       WBC Lab Results   Component Value Date/Time    WBC 7.7 04/14/2021 02:52 AM        Procalcitonin Lab Results   Component Value Date/Time    Procalcitonin 3.65 04/09/2021 05:34 AM        For Antifungals, Metronidazole and Nafcillin: Lab Results   Component Value Date/Time    ALT (SGPT) 16 04/11/2021 05:16 AM    AST (SGOT) 16 04/11/2021 05:16 AM    Alk. phosphatase 44 (L) 04/11/2021 05:16 AM    Bilirubin, total 1.2 (H) 04/11/2021 05:16 AM        Pharmacist Ricka A Bond

## 2021-04-14 NOTE — Progress Notes (Signed)
 0930: TRANSFER - IN REPORT:    Verbal report received from Abagail(name) on Tasnim Balentine  being received from PCC(unit) for routine progression of care.     Report consisted of patient's Situation, Background, Assessment and   Recommendations(SBAR).     Information from the following report(s) SBAR, Kardex, Intake/Output, MAR, Recent Results, and Cardiac Rhythm A Paced  was reviewed with the receiving nurse.    Opportunity for questions and clarification was provided.      1015: patient arrived via bed with [facility] RN and RRT RN to ICU; CT completed prior to arrival ; vital signs stable upon arrival     1100: Assessment completed     1605: reassessment completed    1900: Bedside and Verbal shift change report given to Asberry (Cabin crew) by Rocky (offgoing nurse). Report included the following information SBAR, Kardex, Intake/Output, MAR, Recent Results, and Cardiac Rhythm A Paced .

## 2021-04-14 NOTE — Progress Notes (Signed)
 9142: RRT RN had been called to take a look at Pt and RN waiting on her arrival while doing neuro check on Pt. On neuro check RN noted Pt's tongue deviating to R, however grips and movement equal bilaterally. RRT called because of Pt's increased lethargy/decreased alertness. MD had previously been notified about Pt's inability to stay awake during short conversations and orders were received for 500cc bolus of NS. Bolus was running and VS rechecked. Noted temp of 100.6 so tylenol  given. Despite bolus and improved BP, Pt still lethargic. MD at bedside and agreeing that slight deviation to R noted. Report called to ICU RN and Pt taken with RRT RN to CT. Pt's son, Ishanvi Mcquitty, called and updated of plan.     9061: Per RRT RN, primary RN not to hang abx prior to going to CT because abx were not a live saving medication and should be held until ICU RN could hang. ICU RN updated that primary RN had not hung abx per RRT RN's instruction.     TRANSFER - OUT REPORT:    Verbal report given to Erin RN(name) on Deborah [name]  being transferred to ICU (unit) for routine progression of care       Report consisted of patient's Situation, Background, Assessment and   Recommendations(SBAR).     Information from the following report(s) SBAR, Intake/Output, MAR, Recent Results, and Cardiac Rhythm a-paced  was reviewed with the receiving nurse.    Lines:   PICC Single Lumen 04/13/21 Right;Cephalic (Active)   Central Line Being Utilized Yes 04/14/21 0857   Criteria for Appropriate Use Long term IV/antibiotic administration 04/14/21 0857   Site Assessment Clean, dry, & intact 04/14/21 0857   Phlebitis Assessment 0 04/14/21 0857   Infiltration Assessment 0 04/14/21 0857   Arm Circumference (cm) 40 cm 04/13/21 1522   Date of Last Dressing Change 04/13/21 04/13/21 1522   Dressing Status Clean, dry, & intact;New;Occlusive 04/13/21 2000   External Catheter Length (cm) 1 centimeters 04/13/21 1522   Dressing Type  Antimicrobial;Stabilization/securement device;Topical skin adhesive;Transparent w/CHG gel;Tape 04/13/21 2000   Hub Color/Line Status Purple;Flushed;End cap changed;Capped;Patent 04/13/21 2000   Action Taken Open ports on tubing capped 04/13/21 2000   Positive Blood Return (Site #1) Yes 04/13/21 1522   Alcohol Cap Used Yes 04/13/21 1522       Extended Dwell Peripheral IV (Active)   Criteria for Appropriate Use Hemodynamically unstable, requiring monitoring lines, vasopressors, or volume resuscitation 04/14/21 0857   Site Assessment Clean, dry, & intact 04/14/21 0857   Phlebitis Assessment 0 04/14/21 0857   Infiltration Assessment 0 04/14/21 0857   Line Status Brisk blood return;Flushed 04/12/21 1525   Line Care Connections checked and tightened 04/12/21 1943   Alcohol Cap Used Yes 04/12/21 1525   Date of Last Dressing Change 04/09/21 04/11/21 1600   Dressing Type Transparent;Stabilization/securement device 04/12/21 1943   Dressing Status Clean, dry, & intact 04/12/21 1943   Dressing Intervention Dressing reinforced 04/12/21 1943        Opportunity for questions and clarification was provided.      Patient transported with:   O2 @ 3 liters  Registered Nurse

## 2021-04-15 ENCOUNTER — Inpatient Hospital Stay: Admit: 2021-04-15 | Payer: MEDICARE | Primary: Family

## 2021-04-15 ENCOUNTER — Inpatient Hospital Stay: Payer: MEDICARE | Primary: Family

## 2021-04-15 DIAGNOSIS — A401 Sepsis due to streptococcus, group B: Secondary | ICD-10-CM

## 2021-04-15 LAB — MAGNESIUM
Magnesium: 2.1 mg/dL (ref 1.6–2.4)
Magnesium: 2.1 mg/dL (ref 1.6–2.4)

## 2021-04-15 LAB — METABOLIC PANEL, BASIC
Anion gap: 4 mmol/L — ABNORMAL LOW (ref 5–15)
BUN/Creatinine ratio: 32 — ABNORMAL HIGH (ref 12–20)
BUN: 30 MG/DL — ABNORMAL HIGH (ref 6–20)
CO2: 28 mmol/L (ref 21–32)
Calcium: 9 MG/DL (ref 8.5–10.1)
Chloride: 104 mmol/L (ref 97–108)
Creatinine: 0.93 MG/DL (ref 0.55–1.02)
Glucose: 211 mg/dL — ABNORMAL HIGH (ref 65–100)
Potassium: 4.8 mmol/L (ref 3.5–5.1)
Sodium: 136 mmol/L (ref 136–145)
eGFR: >60 mL/min/{1.73_m2} (ref >60)

## 2021-04-15 LAB — BLOOD GAS, ARTERIAL
BASE DEFICIT: 1.9 mmol/L
BASE EXCESS: 2.5 mmol/L
BASE EXCESS: 2.2 mmol/L
BICARBONATE: 26 mmol/L (ref 22–26)
BICARBONATE: 30 mmol/L — ABNORMAL HIGH (ref 22–26)
BICARBONATE: 29 mmol/L — ABNORMAL HIGH (ref 22–26)
FIO2: 40 %
O2 FLOW RATE: 3 L/min
O2 SAT: 93 % (ref 92–97)
O2 SAT: 98 % — ABNORMAL HIGH (ref 92–97)
O2 SAT: 98 % — ABNORMAL HIGH (ref 92–97)
PCO2: 55 mmHg — ABNORMAL HIGH (ref 35–45)
PCO2: 60 mmHg — ABNORMAL HIGH (ref 35–45)
PCO2: 55 mmHg — ABNORMAL HIGH (ref 35–45)
PEEP/CPAP: 10
PEEP/CPAP: 10
PO2: 75 mmHg — ABNORMAL LOW (ref 80–100)
PO2: 122 mmHg — ABNORMAL HIGH (ref 80–100)
PO2: 130 mmHg — ABNORMAL HIGH (ref 80–100)
PRESSURE SUPPORT: 8
PRESSURE SUPPORT: 8
pH: 7.29 — ABNORMAL LOW (ref 7.35–7.45)
pH: 7.32 — ABNORMAL LOW (ref 7.35–7.45)
pH: 7.35 (ref 7.35–7.45)

## 2021-04-15 LAB — CBC WITH AUTOMATED DIFF
ABS. BASOPHILS: 0 10*3/uL (ref 0.0–0.1)
ABS. EOSINOPHILS: 0 10*3/uL (ref 0.0–0.4)
ABS. IMM. GRANS.: 0 10*3/uL
ABS. LYMPHOCYTES: 0.6 10*3/uL — ABNORMAL LOW (ref 0.8–3.5)
ABS. MONOCYTES: 0.1 10*3/uL (ref 0.0–1.0)
ABS. NEUTROPHILS: 2.3 10*3/uL (ref 1.8–8.0)
ABSOLUTE NRBC: 0 10*3/uL (ref 0.00–0.01)
BAND NEUTROPHILS: 1 % (ref 0–6)
BASOPHILS: 0 % (ref 0–1)
EOSINOPHILS: 0 % (ref 0–7)
HCT: 27 % — ABNORMAL LOW (ref 35.0–47.0)
HGB: 8.5 g/dL — ABNORMAL LOW (ref 11.5–16.0)
IMMATURE GRANULOCYTES: 0 %
LYMPHOCYTES: 20 % (ref 12–49)
MCH: 29.3 PG (ref 26.0–34.0)
MCHC: 31.5 g/dL (ref 30.0–36.5)
MCV: 93.1 FL (ref 80.0–99.0)
METAMYELOCYTES: 2 % — ABNORMAL HIGH
MONOCYTES: 4 % — ABNORMAL LOW (ref 5–13)
MPV: 11.5 FL (ref 8.9–12.9)
MYELOCYTES: 1 % — ABNORMAL HIGH
NEUTROPHILS: 72 % (ref 32–75)
NRBC: 0 PER 100 WBC
PLATELET: 84 10*3/uL — ABNORMAL LOW (ref 150–400)
RBC: 2.9 M/uL — ABNORMAL LOW (ref 3.80–5.20)
RDW: 13.7 % (ref 11.5–14.5)
WBC: 3.2 10*3/uL — ABNORMAL LOW (ref 3.6–11.0)

## 2021-04-15 LAB — GLUCOSE, POC
Glucose (POC): 266 mg/dL — ABNORMAL HIGH (ref 65–117)
Glucose (POC): 182 mg/dL — ABNORMAL HIGH (ref 65–117)
Glucose (POC): 227 mg/dL — ABNORMAL HIGH (ref 65–117)
Glucose (POC): 156 mg/dL — ABNORMAL HIGH (ref 65–117)

## 2021-04-15 LAB — NT-PRO BNP: NT pro-BNP: 10954 PG/ML — ABNORMAL HIGH (ref <125)

## 2021-04-15 LAB — LACTIC ACID
Lactic Acid: 0.8 MMOL/L (ref 0.4–2.0)
Lactic acid: 0.8 MMOL/L (ref 0.4–2.0)

## 2021-04-15 LAB — ARTERIAL BLOOD GASES
Base Deficit: 1.9 mmol/L
Base Excess: 2.5 mmol/L
Base Excess: 2.2 mmol/L
FIO2: 40 %
HCO3: 26 mmol/L (ref 22–26)
HCO3: 30 mmol/L — ABNORMAL HIGH (ref 22–26)
HCO3: 29 mmol/L — ABNORMAL HIGH (ref 22–26)
LITERS PER MINUTE: 3 L/min
O2 Sat: 93 % (ref 92–97)
O2 Sat: 98 % — ABNORMAL HIGH (ref 92–97)
O2 Sat: 98 % — ABNORMAL HIGH (ref 92–97)
PCO2: 55 mmHg — ABNORMAL HIGH (ref 35–45)
PCO2: 60 mmHg — ABNORMAL HIGH (ref 35–45)
PCO2: 55 mmHg — ABNORMAL HIGH (ref 35–45)
PO2: 75 mmHg — ABNORMAL LOW (ref 80–100)
PO2: 122 mmHg — ABNORMAL HIGH (ref 80–100)
PO2: 130 mmHg — ABNORMAL HIGH (ref 80–100)
Peep/Cpap: 10
Peep/Cpap: 10
Pressure Support: 8
Pressure Support: 8
pH: 7.29 — ABNORMAL LOW (ref 7.35–7.45)
pH: 7.32 — ABNORMAL LOW (ref 7.35–7.45)
pH: 7.35 (ref 7.35–7.45)

## 2021-04-15 LAB — CBC WITH AUTO DIFFERENTIAL
Band Neutrophils: 1 % (ref 0–6)
Basophils %: 0 % (ref 0–1)
Basophils Absolute: 0 10*3/uL (ref 0.0–0.1)
Eosinophils %: 0 % (ref 0–7)
Eosinophils Absolute: 0 10*3/uL (ref 0.0–0.4)
Granulocyte Absolute Count: 0 10*3/uL
Hematocrit: 27 % — ABNORMAL LOW (ref 35.0–47.0)
Hemoglobin: 8.5 g/dL — ABNORMAL LOW (ref 11.5–16.0)
Immature Granulocytes: 0 %
Lymphocytes %: 20 % (ref 12–49)
Lymphocytes Absolute: 0.6 10*3/uL — ABNORMAL LOW (ref 0.8–3.5)
MCH: 29.3 PG (ref 26.0–34.0)
MCHC: 31.5 g/dL (ref 30.0–36.5)
MCV: 93.1 FL (ref 80.0–99.0)
MPV: 11.5 FL (ref 8.9–12.9)
Metamyelocytes Relative: 2 % — ABNORMAL HIGH
Monocytes %: 4 % — ABNORMAL LOW (ref 5–13)
Monocytes Absolute: 0.1 10*3/uL (ref 0.0–1.0)
Myelocyte Percent: 1 % — ABNORMAL HIGH
NRBC Absolute: 0 10*3/uL (ref 0.00–0.01)
Neutrophils %: 72 % (ref 32–75)
Neutrophils Absolute: 2.3 10*3/uL (ref 1.8–8.0)
Nucleated RBCs: 0 PER 100 WBC
Platelets: 84 10*3/uL — ABNORMAL LOW (ref 150–400)
RBC: 2.9 M/uL — ABNORMAL LOW (ref 3.80–5.20)
RDW: 13.7 % (ref 11.5–14.5)
WBC: 3.2 10*3/uL — ABNORMAL LOW (ref 3.6–11.0)

## 2021-04-15 LAB — BASIC METABOLIC PANEL
Anion Gap: 4 mmol/L — ABNORMAL LOW (ref 5–15)
BUN: 30 MG/DL — ABNORMAL HIGH (ref 6–20)
Bun/Cre Ratio: 32 — ABNORMAL HIGH (ref 12–20)
CO2: 28 mmol/L (ref 21–32)
Calcium: 9 MG/DL (ref 8.5–10.1)
Chloride: 104 mmol/L (ref 97–108)
Creatinine: 0.93 MG/DL (ref 0.55–1.02)
ESTIMATED GLOMERULAR FILTRATION RATE: >60 mL/min/{1.73_m2} (ref >60)
Glucose: 211 mg/dL — ABNORMAL HIGH (ref 65–100)
Potassium: 4.8 mmol/L (ref 3.5–5.1)
Sodium: 136 mmol/L (ref 136–145)

## 2021-04-15 LAB — POCT GLUCOSE
POC Glucose: 266 mg/dL — ABNORMAL HIGH (ref 65–117)
POC Glucose: 182 mg/dL — ABNORMAL HIGH (ref 65–117)
POC Glucose: 227 mg/dL — ABNORMAL HIGH (ref 65–117)
POC Glucose: 156 mg/dL — ABNORMAL HIGH (ref 65–117)

## 2021-04-15 LAB — PROBNP, N-TERMINAL: BNP: 10954 PG/ML — ABNORMAL HIGH (ref <125)

## 2021-04-15 MED ORDER — PHARMACY VANCOMYCIN NOTE
Freq: Once | Status: AC
Start: 2021-04-15 — End: 2021-04-15
  Administered 2021-04-16: 02:00:00

## 2021-04-15 MED ORDER — METOPROLOL SUCCINATE SR 25 MG 24 HR TAB
25 mg | Freq: Every day | ORAL | Status: DC
Start: 2021-04-15 — End: 2021-04-16
  Administered 2021-04-15 – 2021-04-16 (×2): via ORAL

## 2021-04-15 MED ORDER — SODIUM CHLORIDE 0.9 % IV PIGGY BACK
2 gram | Freq: Three times a day (TID) | INTRAVENOUS | Status: DC
Start: 2021-04-15 — End: 2021-04-20
  Administered 2021-04-15 – 2021-04-20 (×16): via INTRAVENOUS

## 2021-04-15 MED ORDER — FUROSEMIDE 10 MG/ML IJ SOLN
10 mg/mL | Freq: Once | INTRAMUSCULAR | Status: AC
Start: 2021-04-15 — End: 2021-04-15
  Administered 2021-04-15: 15:00:00 via INTRAVENOUS

## 2021-04-15 MED ORDER — SODIUM CHLORIDE 0.9% BOLUS IV
0.9 % | Freq: Every day | INTRAVENOUS | Status: DC | PRN
Start: 2021-04-15 — End: 2021-04-24

## 2021-04-15 MED ORDER — FUROSEMIDE 10 MG/ML IJ SOLN
10 mg/mL | Freq: Once | INTRAMUSCULAR | Status: DC
Start: 2021-04-15 — End: 2021-04-15

## 2021-04-15 MED ORDER — MICONAZOLE NITRATE 2 % TOPICAL POWDER
2 % | Freq: Two times a day (BID) | CUTANEOUS | Status: DC
Start: 2021-04-15 — End: 2021-04-24
  Administered 2021-04-15 – 2021-04-24 (×14): via TOPICAL

## 2021-04-15 MED FILL — HEPARIN (PORCINE) 5,000 UNIT/ML IJ SOLN: 5000 unit/mL | INTRAMUSCULAR | Qty: 1

## 2021-04-15 MED FILL — CARAFATE 1 GRAM TABLET: 1 gram | ORAL | Qty: 1

## 2021-04-15 MED FILL — OXYCODONE 5 MG TAB: 5 mg | ORAL | Qty: 1

## 2021-04-15 MED FILL — LEVOTHYROXINE 75 MCG TAB: 75 mcg | ORAL | Qty: 2

## 2021-04-15 MED FILL — TRAZODONE 100 MG TAB: 100 mg | ORAL | Qty: 1

## 2021-04-15 MED FILL — VANCOMYCIN 750 MG IV SOLUTION: 750 mg | INTRAVENOUS | Qty: 750

## 2021-04-15 MED FILL — REMEDY PHYTOPLEX ANTIFUNGAL 2 % TOPICAL POWDER: 2 % | CUTANEOUS | Qty: 85

## 2021-04-15 MED FILL — PHARMACY VANCOMYCIN NOTE: Qty: 1

## 2021-04-15 MED FILL — AMIODARONE 200 MG TAB: 200 mg | ORAL | Qty: 1

## 2021-04-15 MED FILL — ATORVASTATIN 10 MG TAB: 10 mg | ORAL | Qty: 1

## 2021-04-15 MED FILL — CITALOPRAM 20 MG TAB: 20 mg | ORAL | Qty: 2

## 2021-04-15 MED FILL — ACETAMINOPHEN 325 MG TABLET: 325 mg | ORAL | Qty: 2

## 2021-04-15 MED FILL — ARFORMOTEROL 15 MCG/2 ML NEB SOLUTION: 15 mcg/2 mL | RESPIRATORY_TRACT | Qty: 2

## 2021-04-15 MED FILL — MONTELUKAST 10 MG TAB: 10 mg | ORAL | Qty: 1

## 2021-04-15 MED FILL — BUDESONIDE 0.5 MG/2 ML NEB SUSPENSION: 0.5 mg/2 mL | RESPIRATORY_TRACT | Qty: 1

## 2021-04-15 MED FILL — SODIUM CHLORIDE 0.9 % IV: INTRAVENOUS | Qty: 500

## 2021-04-15 MED FILL — FUROSEMIDE 10 MG/ML IJ SOLN: 10 mg/mL | INTRAMUSCULAR | Qty: 2

## 2021-04-15 MED FILL — CEFEPIME 2 GRAM SOLUTION FOR INJECTION: 2 gram | INTRAMUSCULAR | Qty: 2

## 2021-04-15 MED FILL — METOPROLOL SUCCINATE SR 25 MG 24 HR TAB: 25 mg | ORAL | Qty: 1

## 2021-04-15 MED FILL — INSULIN LISPRO 100 UNIT/ML INJECTION: 100 unit/mL | SUBCUTANEOUS | Qty: 5

## 2021-04-15 MED FILL — INSULIN LISPRO 100 UNIT/ML INJECTION: 100 unit/mL | SUBCUTANEOUS | Qty: 1

## 2021-04-15 MED FILL — ASPIRIN 81 MG CHEWABLE TAB: 81 mg | ORAL | Qty: 1

## 2021-04-15 NOTE — Progress Notes (Signed)
 0700: Bedside and Verbal shift change report given to Erin Control and instrumentation engineer) by Asberry (offgoing nurse). Report included the following information SBAR, Kardex, Intake/Output, MAR, Recent Results, and Cardiac Rhythm APaced .      0800: assessment completed    1200: reassessment completed    1530: bath completed with PCT    1600: reassessment completed    1620: Dr. Malcom Cross Served regarding patient's feeling of an abscess where tooth recently fell out    1900: Bedside and Verbal shift change report given to Asberry (Cabin crew) by Rocky (offgoing nurse). Report included the following information SBAR, Kardex, Intake/Output, MAR, Recent Results, and Cardiac Rhythm A Paced .

## 2021-04-15 NOTE — Progress Notes (Signed)
Bedside and Verbal shift change report given to Lurena Joiner RN (oncoming nurse) by Denny Peon, RN (offgoing nurse). Report included the following information SBAR, Kardex, Procedure Summary, Intake/Output, MAR, Accordion, Recent Results, Med Rec Status, Cardiac Rhythm A-PACED, and Alarm Parameters .      1930: Shift assessment complete, patient alert, awake and responsive to staff, states feels well. Denies pain. On NC at 2LPM    2200: PM meds given (see MAR), BG checked. Patient on BIPAP, states tolerating well.    0000: Shift reassessment complete.     0115: Patient called irritated, states cannot tolerate BIPAP without her trazodone. Trazodone held by provider, advised probably secondary to lethargy and drowsiness last night. Patient states she needs it, she has npt slept all day. States nothing else works. In house provider notified via secure text, awaiting response. Medicated with tylenol for C/O headache.     0230:Patient awake in room, states headache improved. Advised waiting for provider response about trazodone, verbalized understanding.    0400: Reassessment complete, labs drawn, medicated for pain (see MAR). Per patient, Will discuss Trazodone with provider in AM. States usually has tot take Trazodone 100mg  plus tylenol PM to get 6-8 hours of sleep at home.     Bedside and Verbal shift change report given to Judeth Cornfield, Charity fundraiser (oncoming nurse) by Lurena Joiner, RN (offgoing nurse). Report included the following information SBAR, Kardex, Procedure Summary, Intake/Output, MAR, Recent Results, Med Rec Status, Cardiac Rhythm A-PACED, and Alarm Parameters .

## 2021-04-15 NOTE — Progress Notes (Signed)
 Problem: Pressure Injury - Risk of  Goal: *Prevention of pressure injury  Description: Document Braden Scale and appropriate interventions in the flowsheet.  Outcome: Progressing Towards Goal  Note: Pressure Injury Interventions:  Sensory Interventions: Assess changes in LOC, Assess need for specialty bed, Keep linens dry and wrinkle-free, Maintain/enhance activity level, Minimize linen layers, Turn and reposition approx. every two hours (pillows and wedges if needed), Pressure redistribution bed/mattress (bed type)    Moisture Interventions: Absorbent underpads, Apply protective barrier, creams and emollients, Check for incontinence Q2 hours and as needed, Internal/External urinary devices    Activity Interventions: Assess need for specialty bed, Pressure redistribution bed/mattress(bed type)    Mobility Interventions: Assess need for specialty bed, Pressure redistribution bed/mattress (bed type)    Nutrition Interventions: Document food/fluid/supplement intake    Friction and Shear Interventions: Apply protective barrier, creams and emollients, Foam dressings/transparent film/skin sealants, Transferring/repositioning devices, Minimize layers

## 2021-04-16 ENCOUNTER — Inpatient Hospital Stay: Payer: MEDICARE | Primary: Family

## 2021-04-16 ENCOUNTER — Inpatient Hospital Stay: Admit: 2021-04-16 | Payer: MEDICARE | Primary: Family

## 2021-04-16 LAB — EKG, 12 LEAD, INITIAL
Atrial Rate: 79 {beats}/min
Calculated P Axis: 53 degrees
Calculated R Axis: -94 degrees
Calculated T Axis: 19 degrees
P-R Interval: 200 ms
Q-T Interval: 422 ms
QRS Duration: 160 ms
QTC Calculation (Bezet): 483 ms
Ventricular Rate: 79 {beats}/min

## 2021-04-16 LAB — METABOLIC PANEL, BASIC
Anion gap: 4 mmol/L — ABNORMAL LOW (ref 5–15)
BUN/Creatinine ratio: 36 — ABNORMAL HIGH (ref 12–20)
BUN: 41 MG/DL — ABNORMAL HIGH (ref 6–20)
CO2: 30 mmol/L (ref 21–32)
Calcium: 9.3 MG/DL (ref 8.5–10.1)
Chloride: 103 mmol/L (ref 97–108)
Creatinine: 1.13 MG/DL — ABNORMAL HIGH (ref 0.55–1.02)
Glucose: 202 mg/dL — ABNORMAL HIGH (ref 65–100)
Potassium: 4.5 mmol/L (ref 3.5–5.1)
Sodium: 137 mmol/L (ref 136–145)
eGFR: 54 mL/min/{1.73_m2} — ABNORMAL LOW (ref >60)

## 2021-04-16 LAB — CBC WITH AUTOMATED DIFF
ABS. BASOPHILS: 0 10*3/uL (ref 0.0–0.1)
ABS. EOSINOPHILS: 0 10*3/uL (ref 0.0–0.4)
ABS. IMM. GRANS.: 0 10*3/uL
ABS. LYMPHOCYTES: 1.2 10*3/uL (ref 0.8–3.5)
ABS. MONOCYTES: 0.4 10*3/uL (ref 0.0–1.0)
ABS. NEUTROPHILS: 4 10*3/uL (ref 1.8–8.0)
ABSOLUTE NRBC: 0 10*3/uL (ref 0.00–0.01)
BAND NEUTROPHILS: 1 % (ref 0–6)
BASOPHILS: 0 % (ref 0–1)
EOSINOPHILS: 0 % (ref 0–7)
HCT: 29 % — ABNORMAL LOW (ref 35.0–47.0)
HGB: 9.2 g/dL — ABNORMAL LOW (ref 11.5–16.0)
IMMATURE GRANULOCYTES: 0 %
LYMPHOCYTES: 21 % (ref 12–49)
MCH: 29.2 PG (ref 26.0–34.0)
MCHC: 31.7 g/dL (ref 30.0–36.5)
MCV: 92.1 FL (ref 80.0–99.0)
METAMYELOCYTES: 2 % — ABNORMAL HIGH
MONOCYTES: 7 % (ref 5–13)
MPV: 11.3 FL (ref 8.9–12.9)
NEUTROPHILS: 69 % (ref 32–75)
NRBC: 0 PER 100 WBC
PLATELET: 131 10*3/uL — ABNORMAL LOW (ref 150–400)
RBC: 3.15 M/uL — ABNORMAL LOW (ref 3.80–5.20)
RDW: 13.9 % (ref 11.5–14.5)
WBC: 5.7 10*3/uL (ref 3.6–11.0)

## 2021-04-16 LAB — GLUCOSE, POC
Glucose (POC): 216 mg/dL — ABNORMAL HIGH (ref 65–117)
Glucose (POC): 152 mg/dL — ABNORMAL HIGH (ref 65–117)
Glucose (POC): 238 mg/dL — ABNORMAL HIGH (ref 65–117)
Glucose (POC): 197 mg/dL — ABNORMAL HIGH (ref 65–117)

## 2021-04-16 LAB — VANCOMYCIN, RANDOM: Vancomycin, random: 16 UG/ML

## 2021-04-16 LAB — MAGNESIUM
Magnesium: 2 mg/dL (ref 1.6–2.4)
Magnesium: 2 mg/dL (ref 1.6–2.4)

## 2021-04-16 LAB — EKG 12-LEAD
Atrial Rate: 79 {beats}/min
P Axis: 53 degrees
P-R Interval: 200 ms
Q-T Interval: 422 ms
QRS Duration: 160 ms
QTc Calculation (Bazett): 483 ms
R Axis: -94 degrees
T Axis: 19 degrees
Ventricular Rate: 79 {beats}/min

## 2021-04-16 LAB — BASIC METABOLIC PANEL
Anion Gap: 4 mmol/L — ABNORMAL LOW (ref 5–15)
BUN: 41 MG/DL — ABNORMAL HIGH (ref 6–20)
Bun/Cre Ratio: 36 — ABNORMAL HIGH (ref 12–20)
CO2: 30 mmol/L (ref 21–32)
Calcium: 9.3 MG/DL (ref 8.5–10.1)
Chloride: 103 mmol/L (ref 97–108)
Creatinine: 1.13 MG/DL — ABNORMAL HIGH (ref 0.55–1.02)
ESTIMATED GLOMERULAR FILTRATION RATE: 54 mL/min/{1.73_m2} — ABNORMAL LOW (ref >60)
Glucose: 202 mg/dL — ABNORMAL HIGH (ref 65–100)
Potassium: 4.5 mmol/L (ref 3.5–5.1)
Sodium: 137 mmol/L (ref 136–145)

## 2021-04-16 LAB — CBC WITH AUTO DIFFERENTIAL
Band Neutrophils: 1 % (ref 0–6)
Basophils %: 0 % (ref 0–1)
Basophils Absolute: 0 10*3/uL (ref 0.0–0.1)
Eosinophils %: 0 % (ref 0–7)
Eosinophils Absolute: 0 10*3/uL (ref 0.0–0.4)
Granulocyte Absolute Count: 0 10*3/uL
Hematocrit: 29 % — ABNORMAL LOW (ref 35.0–47.0)
Hemoglobin: 9.2 g/dL — ABNORMAL LOW (ref 11.5–16.0)
Immature Granulocytes: 0 %
Lymphocytes %: 21 % (ref 12–49)
Lymphocytes Absolute: 1.2 10*3/uL (ref 0.8–3.5)
MCH: 29.2 PG (ref 26.0–34.0)
MCHC: 31.7 g/dL (ref 30.0–36.5)
MCV: 92.1 FL (ref 80.0–99.0)
MPV: 11.3 FL (ref 8.9–12.9)
Metamyelocytes Relative: 2 % — ABNORMAL HIGH
Monocytes %: 7 % (ref 5–13)
Monocytes Absolute: 0.4 10*3/uL (ref 0.0–1.0)
NRBC Absolute: 0 10*3/uL (ref 0.00–0.01)
Neutrophils %: 69 % (ref 32–75)
Neutrophils Absolute: 4 10*3/uL (ref 1.8–8.0)
Nucleated RBCs: 0 PER 100 WBC
Platelets: 131 10*3/uL — ABNORMAL LOW (ref 150–400)
RBC: 3.15 M/uL — ABNORMAL LOW (ref 3.80–5.20)
RDW: 13.9 % (ref 11.5–14.5)
WBC: 5.7 10*3/uL (ref 3.6–11.0)

## 2021-04-16 LAB — POCT GLUCOSE
POC Glucose: 216 mg/dL — ABNORMAL HIGH (ref 65–117)
POC Glucose: 152 mg/dL — ABNORMAL HIGH (ref 65–117)
POC Glucose: 238 mg/dL — ABNORMAL HIGH (ref 65–117)
POC Glucose: 197 mg/dL — ABNORMAL HIGH (ref 65–117)

## 2021-04-16 LAB — VANCOMYCIN LEVEL, RANDOM: Vancomycin Rm: 16 UG/ML

## 2021-04-16 MED ORDER — METOPROLOL SUCCINATE SR 25 MG 24 HR TAB
25 mg | Freq: Every day | ORAL | Status: DC
Start: 2021-04-16 — End: 2021-04-24
  Administered 2021-04-17 – 2021-04-24 (×7): via ORAL

## 2021-04-16 MED ORDER — METOPROLOL SUCCINATE SR 25 MG 24 HR TAB
25 mg | Freq: Every day | ORAL | Status: DC
Start: 2021-04-16 — End: 2021-04-16

## 2021-04-16 MED ORDER — METRONIDAZOLE 250 MG TAB
250 mg | Freq: Two times a day (BID) | ORAL | Status: AC
Start: 2021-04-16 — End: 2021-04-23
  Administered 2021-04-17 – 2021-04-23 (×14): via ORAL

## 2021-04-16 MED ORDER — METOPROLOL SUCCINATE SR 25 MG 24 HR TAB
25 mg | Freq: Once | ORAL | Status: AC
Start: 2021-04-16 — End: 2021-04-16
  Administered 2021-04-16: 15:00:00 via ORAL

## 2021-04-16 MED FILL — VANCOMYCIN 750 MG IV SOLUTION: 750 mg | INTRAVENOUS | Qty: 750

## 2021-04-16 MED FILL — ARFORMOTEROL 15 MCG/2 ML NEB SOLUTION: 15 mcg/2 mL | RESPIRATORY_TRACT | Qty: 2

## 2021-04-16 MED FILL — CEFEPIME 2 GRAM SOLUTION FOR INJECTION: 2 gram | INTRAMUSCULAR | Qty: 2

## 2021-04-16 MED FILL — METOPROLOL SUCCINATE SR 25 MG 24 HR TAB: 25 mg | ORAL | Qty: 1

## 2021-04-16 MED FILL — INSULIN LISPRO 100 UNIT/ML INJECTION: 100 unit/mL | SUBCUTANEOUS | Qty: 2

## 2021-04-16 MED FILL — CARAFATE 1 GRAM TABLET: 1 gram | ORAL | Qty: 1

## 2021-04-16 MED FILL — HEPARIN (PORCINE) 5,000 UNIT/ML IJ SOLN: 5000 unit/mL | INTRAMUSCULAR | Qty: 1

## 2021-04-16 MED FILL — ATORVASTATIN 10 MG TAB: 10 mg | ORAL | Qty: 1

## 2021-04-16 MED FILL — MONTELUKAST 10 MG TAB: 10 mg | ORAL | Qty: 1

## 2021-04-16 MED FILL — ACETAMINOPHEN 325 MG TABLET: 325 mg | ORAL | Qty: 2

## 2021-04-16 MED FILL — CITALOPRAM 20 MG TAB: 20 mg | ORAL | Qty: 2

## 2021-04-16 MED FILL — ASPIRIN 81 MG CHEWABLE TAB: 81 mg | ORAL | Qty: 1

## 2021-04-16 MED FILL — OXYCODONE 5 MG TAB: 5 mg | ORAL | Qty: 1

## 2021-04-16 MED FILL — BUDESONIDE 0.5 MG/2 ML NEB SUSPENSION: 0.5 mg/2 mL | RESPIRATORY_TRACT | Qty: 1

## 2021-04-16 MED FILL — LEVOTHYROXINE 75 MCG TAB: 75 mcg | ORAL | Qty: 2

## 2021-04-16 MED FILL — LIDOCAINE 4 % TOPICAL PATCH (12 HOUR DURATION): 4 % | CUTANEOUS | Qty: 2

## 2021-04-16 NOTE — Progress Notes (Signed)
 [facility] Dosing Services: Antimicrobial Stewardship Daily Doc    Consult for antibiotic dosing of vancomycin by Dr. Malcom  Indication: sepsis   Day of Therapy: 3    Ht Readings from Last 1 Encounters:   04/16/21 170.2 cm (67.01)        Wt Readings from Last 1 Encounters:   04/16/21 158.2 kg (348 lb 12.3 oz)        Vancomycin therapy:  Current maintenance dose: vancomycin 750 mg IV every 12 hours   Last level: 16 mcg/mL on 3/19 @ 2148, AUC=467  Dose calculated to approximate a           a. Target AUC/MIC of 400-600    Plan: AUC therapeutic today, continue current regimen. Small bump in Scr today but overall stable. WBC WNL, afebrile. Recommend next level later this week if remains on therapy.   Dose administration notes:   Doses given appropriately as scheduled      Other Antimicrobial   (not dosed by pharmacist) Cefepime 2g IV every 8 hours   Cultures 3/17 Blood: NGTD x 3d (prelim)  3/16 Blood: NGTD x 4d (prelim)  3/13 Blood: NG (final)  3/13 Urine: NG (final)  3/13 MRSA: negative (final)  3/12 Blood x 2: Group B strep agalactiae (final, pan sens)   Serum Creatinine Lab Results   Component Value Date/Time    Creatinine 1.13 (H) 04/16/2021 03:36 AM         Creatinine Clearance Estimated Creatinine Clearance: 79.6 mL/min (A) (based on SCr of 1.13 mg/dL (H)).     Temp Temp: 98.4 F (36.9 C)       WBC Lab Results   Component Value Date/Time    WBC 5.7 04/16/2021 03:36 AM        Procalcitonin Lab Results   Component Value Date/Time    Procalcitonin 3.65 04/09/2021 05:34 AM        For Antifungals, Metronidazole and Nafcillin: Lab Results   Component Value Date/Time    ALT (SGPT) 16 04/11/2021 05:16 AM    AST (SGOT) 16 04/11/2021 05:16 AM    Alk. phosphatase 44 (L) 04/11/2021 05:16 AM    Bilirubin, total 1.2 (H) 04/11/2021 05:16 AM        Pharmacist Ashley-Nicole Loving

## 2021-04-16 NOTE — Progress Notes (Signed)
 Problem: Mobility Impaired (Adult and Pediatric)  Goal: *Acute Goals and Plan of Care (Insert Text)  Description: FUNCTIONAL STATUS PRIOR TO ADMISSION: Patient was modified independent using a single point cane for functional mobility.    Problem: Mobility Impaired (Adult and Pediatric)  Goal: *Acute Goals and Plan of Care (Insert Text)  Description: FUNCTIONAL STATUS PRIOR TO ADMISSION: Patient was modified independent using a single point cane for functional mobility.    HOME SUPPORT PRIOR TO ADMISSION: The patient lived with family but did not require assist.    Physical Therapy Goals  Initiated 04/12/2021  1.  Patient will move from supine to sit and sit to supine , scoot up and down, and roll side to side in bed with independence within 7 day(s).    2.  Patient will transfer from bed to chair and chair to bed with modified independence using the least restrictive device within 7 day(s).  3.  Patient will perform sit to stand with modified independence within 7 day(s).  4.  Patient will ambulate with modified independence for 50 feet with the least restrictive device within 7 day(s).   5.  Patient will ascend/descend 4 stairs with R handrail(s) with supervision/set-up within 7 day(s).    04/16/2021 1447 by Morris Lukes, SPT  Outcome: Not Progressing Towards Goal      PHYSICAL THERAPY TREATMENT  Patient: Deborah [name] (66 y.o. female)  Date: 04/16/2021  Diagnosis: Systemic inflammatory response syndrome (SIRS) (HCC) [R65.10] <principal problem not specified>      Precautions:    Chart, physical therapy assessment, plan of care and goals were reviewed.    ASSESSMENT  Patient continues with skilled PT services and is progressing towards goals. Patient received semi fowler's in bed with O2 2L via NC, in good spirits and agreeable to PT. Ear SpO2 sensor was ill fitting and producing inaccurate readings and was replaced with new finger sensor, which displays at 93% in sitting and decreased to 88% briefly with activity  on 2L O2. Patient again requires min A with hand hold to come to EOB, and states that she needs assistance to don surgical shoe and sock. Patient stands with mod A today and ambulates ~10 feet in room prior to requesting to sit in chair due to R knee discomfort and SOB. Legs raised in recliner, but pt denies pillow under calves due to tight hospital briefs making the position uncomfortable. Patient was instructed on regular performance of ankle pumps and ankle circles throughout the day to assist with edema, to which she expressed understanding. Patient states that L sided chest discomfort remains, but that it has not worsened as it had during rapid response earlier this week, nursing informed.       Current Level of Function Impacting Discharge (mobility/balance): Requires assistance for transfers and ambulation. Requires RW. Requires O2    Other factors to consider for discharge: Patient owns RW.          PLAN :  Patient continues to benefit from skilled intervention to address the above impairments.  Continue treatment per established plan of care.  to address goals.    Recommendation for discharge: (in order for the patient to meet his/her long term goals)  Therapy up to 5 days/week in SNF setting    This discharge recommendation:  Has not yet been discussed the attending provider and/or case management    IF patient discharges home will need the following DME: patient owns DME required for discharge  SUBJECTIVE:   Patient stated  This is very hard on that right knee."    OBJECTIVE DATA SUMMARY:   Critical Behavior:  Neurologic State: Alert  Orientation Level: Oriented X4  Cognition: Follows commands  Safety/Judgement: Awareness of environment, Fall prevention  Functional Mobility Training:  Bed Mobility:  Rolling: Minimum assistance  Supine to Sit: Minimum assistance  Sit to Supine: Minimum assistance           Transfers:  Sit to Stand: Moderate assistance  Stand to Sit: Minimum assistance                              Balance:  Sitting: Intact  Standing: Intact;With support  Standing - Static: Good;Occasional  Standing - Dynamic : Fair;Constant support (R knee p!)  Ambulation/Gait Training:  Distance (ft): 10 Feet (ft)  Assistive Device: Gait belt;Walker, rolling  Ambulation - Level of Assistance: Minimal assistance        Gait Abnormalities: Antalgic;Altered arm swing;Decreased step clearance;Trunk sway increased        Base of Support: Widened     Speed/Cadence: Pace decreased (<100 feet/min)  Step Length: Right shortened;Left shortened  Swing Pattern: Right symmetrical;Left asymmetrical             Therapeutic Exercises:   Seated LAQ, seated march, seated ankle pumps and ankle circles.   Pain Rating:  Not rated, R knee painful with WB activity    Activity Tolerance:   Fair, Poor, desaturates with exertion and requires oxygen, requires rest breaks, and observed SOB with activity    After treatment patient left in no apparent distress:   Sitting in chair, Call bell within reach, and feet elevated. Care aide present in room.    COMMUNICATION/COLLABORATION:   The patient's plan of care was discussed with: Occupational therapist and Registered nurse.     Katrinka Eng, SPT

## 2021-04-16 NOTE — Progress Notes (Signed)
 14:53- informed Dr. Malcom of left sided chest pain while working with PT. Patient stated it was better than previous pain and subsided once she sat down. No orders received.     Bedside and Verbal shift change report given to Corean (Cabin crew) by Asberry (offgoing nurse). Report included the following information SBAR, Kardex, and Cardiac Rhythm paced .

## 2021-04-16 NOTE — Progress Notes (Signed)
 Case Management note:    RUR 19%    LOS 7 days,GLOS 5 days    Transition of Care Plan:            Problems:  SOB  Chest pain  Septic shock  COPD  SVT  PM is not compatible with MRI  GBS bacteremia  Has prosthetic heart valve    Prior to hospitalization:    Pt lives with her son,Devon & his wife Delon in a one story double wide trailer with 4 entry steps with a railing.  Pt has a wheelchair,rolling walker,cane and shower chair.  Pt plans to talk with her son about installing grab bars as she has difficulty getting in & out of the bathtub.  Pt does not have oxygen.  Pt has a home CPaP she uses also but has had no follow-up since moving to [place] .   Pt uses [facility] .     Future discharge needs:    SNF   Long-term iv antibiotic management  Need BiPaP orders for SNF if BiPaP is recommended  Pt .'s top choices for SNF are [facility] and [facility].  I updated pt that [facility] is out of network and does not participate with pt.'s insurance.  Updated clinicals faxed to [facility].  Please let case management know one to two days prior to discharge as SNF will need to obtain insurance auhorization.  Pt has left PICC.  PT also requested I send her clinicals to other SNFs near the hospital.  Clinicals sent to the [facility] and [facility].    I discussed the above with pt today.  Pt also informed she has a recalled CPaP @ home and showed me pictures of her CPaP with black sediment.  It is a [employer] that pt got in [place] but could not remember which physician,etc was managing her CPaP.  Order placed for outpatient sleep study as pt will need to be seen again in the outpatient setting for a new sleep study.      Elizabeth Aristov,RN

## 2021-04-16 NOTE — Progress Notes (Signed)
 [facility] has a bed available @ their facility.Per my previous discussion with Dr Malcom splinter is asking ID physician to review antibiotics today in light of pt spiking a fever yesterday.  Per attending's request,[facility] will start insurance authorization tomorrow.  Liason with [facility] plans to call pt on her cell phone.  [facility] will need updated PT and OT notes in order to obtain auth .    Almarie Ellard PEAK    [facility] or [facility]  will need to have bipap orders before accepting pt as they need to obtain bipap @ their facility before pt arrives,.  Orders for BiPaP would need to be sent to the facility @ least 24 hours before pt discharges.    Elizabeth Aristov,RN

## 2021-04-17 ENCOUNTER — Inpatient Hospital Stay: Admit: 2021-04-17 | Payer: MEDICARE | Primary: Family

## 2021-04-17 ENCOUNTER — Inpatient Hospital Stay: Admit: 2021-04-18 | Payer: MEDICARE | Primary: Family

## 2021-04-17 LAB — CBC WITH AUTOMATED DIFF
ABS. BASOPHILS: 0 10*3/uL (ref 0.0–0.1)
ABS. EOSINOPHILS: 0.1 10*3/uL (ref 0.0–0.4)
ABS. IMM. GRANS.: 0.1 10*3/uL — ABNORMAL HIGH (ref 0.00–0.04)
ABS. LYMPHOCYTES: 1.4 10*3/uL (ref 0.8–3.5)
ABS. MONOCYTES: 0.6 10*3/uL (ref 0.0–1.0)
ABS. NEUTROPHILS: 4.1 10*3/uL (ref 1.8–8.0)
ABSOLUTE NRBC: 0.02 10*3/uL — ABNORMAL HIGH (ref 0.00–0.01)
BASOPHILS: 0 % (ref 0–1)
EOSINOPHILS: 2 % (ref 0–7)
HCT: 29.5 % — ABNORMAL LOW (ref 35.0–47.0)
HGB: 9.3 g/dL — ABNORMAL LOW (ref 11.5–16.0)
IMMATURE GRANULOCYTES: 2 % — ABNORMAL HIGH (ref 0.0–0.5)
LYMPHOCYTES: 22 % (ref 12–49)
MCH: 29.2 PG (ref 26.0–34.0)
MCHC: 31.5 g/dL (ref 30.0–36.5)
MCV: 92.5 FL (ref 80.0–99.0)
MONOCYTES: 10 % (ref 5–13)
MPV: 10.5 FL (ref 8.9–12.9)
NEUTROPHILS: 64 % (ref 32–75)
NRBC: 0.3 PER 100 WBC — ABNORMAL HIGH
PLATELET: 133 10*3/uL — ABNORMAL LOW (ref 150–400)
RBC: 3.19 M/uL — ABNORMAL LOW (ref 3.80–5.20)
RDW: 14 % (ref 11.5–14.5)
WBC: 6.4 10*3/uL (ref 3.6–11.0)

## 2021-04-17 LAB — GLUCOSE, POC
Glucose (POC): 198 mg/dL — ABNORMAL HIGH (ref 65–117)
Glucose (POC): 164 mg/dL — ABNORMAL HIGH (ref 65–117)
Glucose (POC): 246 mg/dL — ABNORMAL HIGH (ref 65–117)
Glucose (POC): 154 mg/dL — ABNORMAL HIGH (ref 65–117)

## 2021-04-17 LAB — ECHO ADULT FOLLOW-UP OR LIMITED
AR Max Velocity PISA: 2.9 m/s
AR PHT: 426.8 millisecond
AV Area by Peak Velocity: 1 cm2
AV Area by VTI: 1 cm2
AV Mean Gradient: 38 mmHg
AV Mean Velocity: 3 m/s
AV Peak Gradient: 60 mmHg
AV Peak Velocity: 3.9 m/s
AV VTI: 101.1 cm
AV Velocity Ratio: 0.21
AVA/BSA Peak Velocity: 0.4 cm2/m2
AVA/BSA VTI: 0.4 cm2/m2
Fractional Shortening 2D: 29 % (ref 28–44)
IVSd: 1.2 cm — AB (ref 0.6–0.9)
LV Mass 2D Index: 59.9 g/m2 (ref 43–95)
LV Mass 2D: 153.2 g (ref 67–162)
LV RWT Ratio: 0.63
LVIDd Index: 1.48 cm/m2
LVIDd: 3.8 cm — AB (ref 3.9–5.3)
LVIDs Index: 1.05 cm/m2
LVIDs: 2.7 cm
LVOT Area: 4.5 cm2
LVOT Diameter: 2.4 cm
LVOT Mean Gradient: 1 mmHg
LVOT Peak Gradient: 3 mmHg
LVOT Peak Velocity: 0.8 m/s
LVOT SV: 95 ml
LVOT Stroke Volume Index: 37.1 mL/m2
LVOT VTI: 21 cm
LVOT:AV VTI Index: 0.21
LVPWd: 1.2 cm — AB (ref 0.6–0.9)

## 2021-04-17 LAB — METABOLIC PANEL, BASIC
Anion gap: 2 mmol/L — ABNORMAL LOW (ref 5–15)
BUN/Creatinine ratio: 35 — ABNORMAL HIGH (ref 12–20)
BUN: 35 MG/DL — ABNORMAL HIGH (ref 6–20)
CO2: 30 mmol/L (ref 21–32)
Calcium: 9.2 MG/DL (ref 8.5–10.1)
Chloride: 104 mmol/L (ref 97–108)
Creatinine: 1.01 MG/DL (ref 0.55–1.02)
Glucose: 170 mg/dL — ABNORMAL HIGH (ref 65–100)
Potassium: 4.4 mmol/L (ref 3.5–5.1)
Sodium: 136 mmol/L (ref 136–145)
eGFR: >60 mL/min/{1.73_m2} (ref >60)

## 2021-04-17 LAB — CULTURE, BLOOD, PAIRED
Culture result:: NO GROWTH
Culture: NO GROWTH

## 2021-04-17 LAB — MAGNESIUM
Magnesium: 1.8 mg/dL (ref 1.6–2.4)
Magnesium: 1.8 mg/dL (ref 1.6–2.4)

## 2021-04-17 LAB — ECHO (TTE) LIMITED (PRN CONTRAST/BUBBLE/STRAIN/3D)
AR Max Velocity PISA: 2.9 m/s
AR PHT: 426.8 millisecond
AV Area by Peak Velocity: 1 cm2
AV Area by VTI: 1 cm2
AV Mean Gradient: 38 mmHg
AV Mean Velocity: 3 m/s
AV Peak Gradient: 60 mmHg
AV Peak Velocity: 3.9 m/s
AV VTI: 101.1 cm
AV Velocity Ratio: 0.21
AVA/BSA Peak Velocity: 0.4 cm2/m2
AVA/BSA VTI: 0.4 cm2/m2
Fractional Shortening 2D: 29 % (ref 28–44)
IVSd: 1.2 cm — AB (ref 0.6–0.9)
LV Mass 2D Index: 59.9 g/m2 (ref 43–95)
LV Mass 2D: 153.2 g (ref 67–162)
LV RWT Ratio: 0.63
LVIDd Index: 1.48 cm/m2
LVIDd: 3.8 cm — AB (ref 3.9–5.3)
LVIDs Index: 1.05 cm/m2
LVIDs: 2.7 cm
LVOT Area: 4.5 cm2
LVOT Diameter: 2.4 cm
LVOT Mean Gradient: 1 mmHg
LVOT Peak Gradient: 3 mmHg
LVOT Peak Velocity: 0.8 m/s
LVOT SV: 95 ml
LVOT Stroke Volume Index: 37.1 mL/m2
LVOT VTI: 21 cm
LVOT:AV VTI Index: 0.21
LVPWd: 1.2 cm — AB (ref 0.6–0.9)

## 2021-04-17 LAB — BASIC METABOLIC PANEL
Anion Gap: 2 mmol/L — ABNORMAL LOW (ref 5–15)
BUN: 35 MG/DL — ABNORMAL HIGH (ref 6–20)
Bun/Cre Ratio: 35 — ABNORMAL HIGH (ref 12–20)
CO2: 30 mmol/L (ref 21–32)
Calcium: 9.2 MG/DL (ref 8.5–10.1)
Chloride: 104 mmol/L (ref 97–108)
Creatinine: 1.01 MG/DL (ref 0.55–1.02)
ESTIMATED GLOMERULAR FILTRATION RATE: >60 mL/min/{1.73_m2} (ref >60)
Glucose: 170 mg/dL — ABNORMAL HIGH (ref 65–100)
Potassium: 4.4 mmol/L (ref 3.5–5.1)
Sodium: 136 mmol/L (ref 136–145)

## 2021-04-17 LAB — POCT GLUCOSE
POC Glucose: 198 mg/dL — ABNORMAL HIGH (ref 65–117)
POC Glucose: 164 mg/dL — ABNORMAL HIGH (ref 65–117)
POC Glucose: 246 mg/dL — ABNORMAL HIGH (ref 65–117)
POC Glucose: 154 mg/dL — ABNORMAL HIGH (ref 65–117)

## 2021-04-17 LAB — CBC WITH AUTO DIFFERENTIAL
Basophils %: 0 % (ref 0–1)
Basophils Absolute: 0 10*3/uL (ref 0.0–0.1)
Eosinophils %: 2 % (ref 0–7)
Eosinophils Absolute: 0.1 10*3/uL (ref 0.0–0.4)
Granulocyte Absolute Count: 0.1 10*3/uL — ABNORMAL HIGH (ref 0.00–0.04)
Hematocrit: 29.5 % — ABNORMAL LOW (ref 35.0–47.0)
Hemoglobin: 9.3 g/dL — ABNORMAL LOW (ref 11.5–16.0)
Immature Granulocytes: 2 % — ABNORMAL HIGH (ref 0.0–0.5)
Lymphocytes %: 22 % (ref 12–49)
Lymphocytes Absolute: 1.4 10*3/uL (ref 0.8–3.5)
MCH: 29.2 PG (ref 26.0–34.0)
MCHC: 31.5 g/dL (ref 30.0–36.5)
MCV: 92.5 FL (ref 80.0–99.0)
MPV: 10.5 FL (ref 8.9–12.9)
Monocytes %: 10 % (ref 5–13)
Monocytes Absolute: 0.6 10*3/uL (ref 0.0–1.0)
NRBC Absolute: 0.02 10*3/uL — ABNORMAL HIGH (ref 0.00–0.01)
Neutrophils %: 64 % (ref 32–75)
Neutrophils Absolute: 4.1 10*3/uL (ref 1.8–8.0)
Nucleated RBCs: 0.3 PER 100 WBC — ABNORMAL HIGH
Platelets: 133 10*3/uL — ABNORMAL LOW (ref 150–400)
RBC: 3.19 M/uL — ABNORMAL LOW (ref 3.80–5.20)
RDW: 14 % (ref 11.5–14.5)
WBC: 6.4 10*3/uL (ref 3.6–11.0)

## 2021-04-17 MED ORDER — DIATRIZOATE MEGLUMINE & SODIUM 66 %-10 % ORAL SOLN
66-10 % | Freq: Once | ORAL | Status: AC
Start: 2021-04-17 — End: 2021-04-17
  Administered 2021-04-17: 18:00:00 via ORAL

## 2021-04-17 MED ORDER — FUROSEMIDE 10 MG/ML IJ SOLN
10 mg/mL | Freq: Once | INTRAMUSCULAR | Status: AC
Start: 2021-04-17 — End: 2021-04-17
  Administered 2021-04-17: 17:00:00 via INTRAVENOUS

## 2021-04-17 MED ORDER — IOPAMIDOL 76 % IV SOLN
370 mg iodine /mL (76 %) | Freq: Once | INTRAVENOUS | Status: AC
Start: 2021-04-17 — End: 2021-04-17
  Administered 2021-04-18: 02:00:00 via INTRAVENOUS

## 2021-04-17 MED FILL — CITALOPRAM 20 MG TAB: 20 mg | ORAL | Qty: 2

## 2021-04-17 MED FILL — INSULIN LISPRO 100 UNIT/ML INJECTION: 100 unit/mL | SUBCUTANEOUS | Qty: 1

## 2021-04-17 MED FILL — GASTROGRAFIN 66 %-10 % ORAL SOLUTION: 66-10 % | ORAL | Qty: 30

## 2021-04-17 MED FILL — CEFEPIME 2 GRAM SOLUTION FOR INJECTION: 2 gram | INTRAMUSCULAR | Qty: 2

## 2021-04-17 MED FILL — METOPROLOL SUCCINATE SR 25 MG 24 HR TAB: 25 mg | ORAL | Qty: 1

## 2021-04-17 MED FILL — METRONIDAZOLE 250 MG TAB: 250 mg | ORAL | Qty: 2

## 2021-04-17 MED FILL — ATORVASTATIN 10 MG TAB: 10 mg | ORAL | Qty: 1

## 2021-04-17 MED FILL — BUDESONIDE 0.5 MG/2 ML NEB SUSPENSION: 0.5 mg/2 mL | RESPIRATORY_TRACT | Qty: 1

## 2021-04-17 MED FILL — ARFORMOTEROL 15 MCG/2 ML NEB SOLUTION: 15 mcg/2 mL | RESPIRATORY_TRACT | Qty: 2

## 2021-04-17 MED FILL — OXYCODONE 5 MG TAB: 5 mg | ORAL | Qty: 1

## 2021-04-17 MED FILL — HEPARIN (PORCINE) 5,000 UNIT/ML IJ SOLN: 5000 unit/mL | INTRAMUSCULAR | Qty: 1

## 2021-04-17 MED FILL — LIDOCAINE 4 % TOPICAL PATCH (12 HOUR DURATION): 4 % | CUTANEOUS | Qty: 2

## 2021-04-17 MED FILL — LEVOTHYROXINE 75 MCG TAB: 75 mcg | ORAL | Qty: 2

## 2021-04-17 MED FILL — MONTELUKAST 10 MG TAB: 10 mg | ORAL | Qty: 1

## 2021-04-17 MED FILL — FUROSEMIDE 10 MG/ML IJ SOLN: 10 mg/mL | INTRAMUSCULAR | Qty: 2

## 2021-04-17 MED FILL — CARAFATE 1 GRAM TABLET: 1 gram | ORAL | Qty: 1

## 2021-04-17 MED FILL — TRAZODONE 100 MG TAB: 100 mg | ORAL | Qty: 1

## 2021-04-17 MED FILL — CATHFLO ACTIVASE 2 MG INTRA-CATHETER SOLUTION: 2 mg | Qty: 1

## 2021-04-17 MED FILL — ASPIRIN 81 MG CHEWABLE TAB: 81 mg | ORAL | Qty: 1

## 2021-04-17 NOTE — Progress Notes (Signed)
 Devon ,son called me to tell me he and his mother have now decided they prefer for pt to go to [facility],closer to home.    BiPaP orders faxed to [facility] through [employer].    Almarie Ellard PEAK    I updated the [facility] regarding pt and son's change with their disposition decision.

## 2021-04-17 NOTE — Progress Notes (Signed)
 Bedside and Verbal shift change report given to Izetta RN (oncoming nurse) by Corean RN (offgoing nurse). Report included the following information SBAR, Procedure Summary, Intake/Output, MAR, Recent Results, Cardiac Rhythm apaced, Alarm Parameters , and Quality Measures.   Primary Nurse Comer Sluder, RN and Corean, RN performed a dual skin assessment on this patient Impairment noted- see wound doc flow sheet  Braden score is see flow sheet    Patient refused to wear Bipap so NC turned up to 6L/min and tolerating well.    Bedside and Verbal shift change report given to Norleen RN (oncoming nurse) by Izetta PEAK (offgoing nurse). Report included the following information SBAR, Intake/Output, MAR, Recent Results, Cardiac Rhythm Apaced, Alarm Parameters , and Quality Measures.

## 2021-04-17 NOTE — Progress Notes (Signed)
Problem: Self Care Deficits Care Plan (Adult)  Goal: *Acute Goals and Plan of Care (Insert Text)  Description: FUNCTIONAL STATUS PRIOR TO ADMISSION: Patient was modified independent using a single point cane for functional mobility. Patient was modified independent for basic and instrumental ADLs. No baseline O2 use.     HOME SUPPORT: The patient lived with her son and his family. Pt reports being home alone during the day.    Occupational Therapy Goals  Initiated 04/12/2021  1.  Patient will perform grooming, standing at sink, with modified independence within 7 day(s).  2.  Patient will perform lower body dressing with supervision/set-up using AD PRN within 7 day(s).  3.  Patient will perform bathing with supervision/set-up within 7 day(s).  4.  Patient will perform toilet transfers with modified independence within 7 day(s).  5.  Patient will perform all aspects of toileting with modified independence within 7 day(s).  6.  Patient will participate in upper extremity therapeutic exercise/activities with independence for 10 minutes within 7 day(s).    7.  Patient will utilize energy conservation techniques during functional activities with verbal cues within 7 day(s).   Outcome: Progressing Towards Goal     OCCUPATIONAL THERAPY TREATMENT  Patient: Deborah [name] (66 y.o. female)  Date: 04/17/2021  Diagnosis: Systemic inflammatory response syndrome (SIRS) (HCC) [R65.10] <principal problem not specified>      Precautions:   WBAT in L LE in surgical shoe  Chart, occupational therapy assessment, plan of care, and goals were reviewed.    ASSESSMENT  Patient continues with skilled OT services and is progressing towards goals.  Patient continues to present with decreased activity tolerance and endurance impacting overall safety and independence with ADLs and mobility. She is received on 4L via nasal cannula with stable saturations at rest, however with activity is noted to desaturate to 86%. She is receptive to cues for PLB  and is observed to recover back up to 90% after 1 minute of seated rest. Pt educated on and performs UE there-ex with focus on chest opening movements to assist with pulmonary hygiene. She requires increased assistance for distal LB dressing and may benefit from education on use of LH AE at next session. Pt seated up in chair at end of session with goal of OOB to 20 minutes at end of session.      Current Level of Function Impacting Discharge (ADLs): up to mod A for LB dressing (for distal mgmt); min A for toileting; CGA/min A for ADL transfers    Other factors to consider for discharge: fall risk         PLAN :  Patient continues to benefit from skilled intervention to address the above impairments.  Continue treatment per established plan of care to address goals.    Recommend with staff: OOB to chair for meals; mobility to bathroom for toileting; ADLs as needed    Recommendation for discharge: (in order for the patient to meet his/her long term goals)  Therapy up to 5 days/week in SNF setting    This discharge recommendation:  Has been made in collaboration with the attending provider and/or case management    IF patient discharges home will need the following DME: TBD       SUBJECTIVE:   Patient stated "Okay."    OBJECTIVE DATA SUMMARY:   Cognitive/Behavioral Status:  Neurologic State: Alert  Orientation Level: Oriented X4  Cognition: Appropriate decision making;Appropriate for age attention/concentration;Appropriate safety awareness;Follows commands  Perception: Appears intact  Perseveration: No  perseveration noted  Safety/Judgement: Awareness of environment;Good awareness of safety precautions;Fall prevention;Home safety;Insight into deficits    Functional Mobility and Transfers for ADLs:  Bed Mobility:  Supine to Sit: Contact guard assistance;Additional time;Bed Modified  Sit to Supine:  (pt seated up in chair at end of session)    Transfers:  Sit to Stand: Minimum assistance  Bed to Chair: Minimum assistance  (with personal SPC)    Balance:  Sitting: Intact  Standing: With support (with SPC)  Standing - Static: Constant support;Good  Standing - Dynamic : Fair;Constant support    ADL Intervention:  Lower Body Dressing Assistance  Socks: Total assistance (dependent)  Shoes with Velcro: Maximum assistance (to manage surgical shoe on L)  Leg Crossed Method Used: No  Position Performed: Seated edge of bed  Cues: Don    Cognitive Retraining  Safety/Judgement: Awareness of environment;Good awareness of safety precautions;Fall prevention;Home safety;Insight into deficits    Therapeutic Exercises:   Pt instructed on and performed UE chest opening exercises at chair level to assist with pulmonary hygiene and maintain UE ROM in prep for ADLs and ADL transfers. Verbal and visual instruction provided to increase clarity and understanding of exercises and form and pt requires cues for breathing through activity. Pt performed sets of 5 reps of shoulder forward flexion, UE rows, posterior shoulder rolls, and "goal post" horizontal shoulder abduction/adduction. Pt verbalized understanding of same and is instructed to perform exercises 2-3x/day with intentional rest breaks to maintain saturations >90% with focused PLB.     Pain:  Pt reporting minimal pain    Activity Tolerance:   Fair, desaturates with exertion and requires oxygen, requires frequent rest breaks, and observed SOB with activity    After treatment patient left in no apparent distress:   Sitting in chair and Call bell within reach    COMMUNICATION/COLLABORATION:   The patient's plan of care was discussed with: Physical therapist and Registered nurse.     Apolinar Junes, OT  Time Calculation: 24 mins

## 2021-04-17 NOTE — Progress Notes (Signed)
 TRANSFER - IN REPORT:    Verbal report received from John(name) on Deborah [name]  being received from ICU(unit) for routine progression of care      Report consisted of patient's Situation, Background, Assessment and   Recommendations(SBAR).     Information from the following report(s) SBAR, Kardex, ED Summary, Procedure Summary, Intake/Output, MAR, Recent Results, and Cardiac Rhythm A paced  was reviewed with the receiving nurse.    Opportunity for questions and clarification was provided.      Assessment completed upon patient's arrival to unit and care assumed.     1700 Pt arrived to the unit.    Bedside and Verbal shift change report given to Tisyl (oncoming nurse) by Alberta (offgoing nurse). Report included the following information SBAR, Kardex, ED Summary, Procedure Summary, Intake/Output, MAR, Recent Results, and Cardiac Rhythm Av paced .

## 2021-04-17 NOTE — Progress Notes (Signed)
 Patient declined Cpap. Patient on 6L NC. Will continue to follow.

## 2021-04-17 NOTE — Progress Notes (Signed)
 Problem: Falls - Risk of  Goal: *Absence of Falls  Description: Document Deborah [name] Fall Risk and appropriate interventions in the flowsheet.  Outcome: Progressing Towards Goal  Note: Fall Risk Interventions:  Mobility Interventions: Patient to call before getting OOB, Bed/chair exit alarm, Communicate number of staff needed for ambulation/transfer, Strengthening exercises (ROM-active/passive)              Elimination Interventions: Call light in reach, Bed/chair exit alarm, Patient to call for help with toileting needs, Toileting schedule/hourly rounds              Problem: Patient Education: Go to Patient Education Activity  Goal: Patient/Family Education  Outcome: Progressing Towards Goal     Problem: Falls - Risk of  Goal: *Absence of Falls  Description: Document Deborah [name] Fall Risk and appropriate interventions in the flowsheet.  Outcome: Progressing Towards Goal  Note: Fall Risk Interventions:  Mobility Interventions: Patient to call before getting OOB, Bed/chair exit alarm, Communicate number of staff needed for ambulation/transfer, Strengthening exercises (ROM-active/passive)              Elimination Interventions: Call light in reach, Bed/chair exit alarm, Patient to call for help with toileting needs, Toileting schedule/hourly rounds              Problem: Patient Education: Go to Patient Education Activity  Goal: Patient/Family Education  Outcome: Progressing Towards Goal     Problem: Pressure Injury - Risk of  Goal: *Prevention of pressure injury  Description: Document Braden Scale and appropriate interventions in the flowsheet.  Outcome: Progressing Towards Goal  Note: Pressure Injury Interventions:  Sensory Interventions: Assess changes in LOC, Discuss PT/OT consult with provider, Float heels, Keep linens dry and wrinkle-free, Maintain/enhance activity level, Turn and reposition approx. every two hours (pillows and wedges if needed), Suspension boots    Moisture Interventions: Absorbent underpads, Check for  incontinence Q2 hours and as needed, Internal/External urinary devices, Minimize layers    Activity Interventions: Assess need for specialty bed, Pressure redistribution bed/mattress(bed type), Increase time out of bed, PT/OT evaluation    Mobility Interventions: Assess need for specialty bed, Float heels, PT/OT evaluation, Turn and reposition approx. every two hours(pillow and wedges)    Nutrition Interventions: Document food/fluid/supplement intake    Friction and Shear Interventions: Apply protective barrier, creams and emollients, HOB 30 degrees or less, Foam dressings/transparent film/skin sealants                Problem: Patient Education: Go to Patient Education Activity  Goal: Patient/Family Education  Outcome: Progressing Towards Goal     Problem: Patient Education: Go to Patient Education Activity  Goal: Patient/Family Education  Outcome: Progressing Towards Goal     Problem: Sepsis: Day of Diagnosis  Goal: Off Pathway (Use only if patient is Off Pathway)  Outcome: Progressing Towards Goal  Goal: *Fluid resuscitation  Outcome: Progressing Towards Goal  Goal: *Paired blood cultures prior to first dose of antibiotic  Outcome: Progressing Towards Goal  Goal: *First dose of  appropriate antibiotic within 3 hours of arrival to ED, within 1 hour of arrival to ICU  Outcome: Progressing Towards Goal  Goal: *Lactic acid with first set of blood cultures  Outcome: Progressing Towards Goal  Goal: *Pneumococcal immunization (if eligible)  Outcome: Progressing Towards Goal  Goal: *Influenza immunization (if eligible)  Outcome: Progressing Towards Goal  Goal: Activity/Safety  Outcome: Progressing Towards Goal  Goal: Consults, if ordered  Outcome: Progressing Towards Goal  Goal: Diagnostic Test/Procedures  Outcome: Progressing Towards Goal  Goal: Nutrition/Diet  Outcome: Progressing Towards Goal  Goal: Discharge Planning  Outcome: Progressing Towards Goal  Goal: Medications  Outcome: Progressing Towards Goal  Goal:  Respiratory  Outcome: Progressing Towards Goal  Goal: Treatments/Interventions/Procedures  Outcome: Progressing Towards Goal  Goal: Psychosocial  Outcome: Progressing Towards Goal     Problem: Sepsis: Day 2  Goal: Off Pathway (Use only if patient is Off Pathway)  Outcome: Progressing Towards Goal  Goal: *Central Venous Pressure maintained at 8-12 mm Hg  Outcome: Progressing Towards Goal  Goal: *Hemodynamically stable  Outcome: Progressing Towards Goal  Goal: *Tolerating diet  Outcome: Progressing Towards Goal  Goal: Activity/Safety  Outcome: Progressing Towards Goal  Goal: Consults, if ordered  Outcome: Progressing Towards Goal  Goal: Diagnostic Test/Procedures  Outcome: Progressing Towards Goal  Goal: Nutrition/Diet  Outcome: Progressing Towards Goal  Goal: Discharge Planning  Outcome: Progressing Towards Goal  Goal: Medications  Outcome: Progressing Towards Goal  Goal: Respiratory  Outcome: Progressing Towards Goal  Goal: Treatments/Interventions/Procedures  Outcome: Progressing Towards Goal  Goal: Psychosocial  Outcome: Progressing Towards Goal     Problem: Sepsis: Day 3  Goal: Off Pathway (Use only if patient is Off Pathway)  Outcome: Progressing Towards Goal  Goal: *Central Venous Pressure maintained at 8-12 mm Hg  Outcome: Progressing Towards Goal  Goal: *Oxygen saturation within defined limits  Outcome: Progressing Towards Goal  Goal: *Vital sign stability  Outcome: Progressing Towards Goal  Goal: *Tolerating diet  Outcome: Progressing Towards Goal  Goal: *Demonstrates progressive activity  Outcome: Progressing Towards Goal  Goal: Activity/Safety  Outcome: Progressing Towards Goal  Goal: Consults, if ordered  Outcome: Progressing Towards Goal  Goal: Diagnostic Test/Procedures  Outcome: Progressing Towards Goal  Goal: Nutrition/Diet  Outcome: Progressing Towards Goal  Goal: Discharge Planning  Outcome: Progressing Towards Goal  Goal: Medications  Outcome: Progressing Towards Goal  Goal: Respiratory  Outcome:  Progressing Towards Goal  Goal: Treatments/Interventions/Procedures  Outcome: Progressing Towards Goal  Goal: Psychosocial  Outcome: Progressing Towards Goal     Problem: Sepsis: Day 4  Goal: Off Pathway (Use only if patient is Off Pathway)  Outcome: Progressing Towards Goal  Goal: Activity/Safety  Outcome: Progressing Towards Goal  Goal: Consults, if ordered  Outcome: Progressing Towards Goal  Goal: Diagnostic Test/Procedures  Outcome: Progressing Towards Goal  Goal: Nutrition/Diet  Outcome: Progressing Towards Goal  Goal: Discharge Planning  Outcome: Progressing Towards Goal  Goal: Medications  Outcome: Progressing Towards Goal  Goal: Respiratory  Outcome: Progressing Towards Goal  Goal: Treatments/Interventions/Procedures  Outcome: Progressing Towards Goal  Goal: Psychosocial  Outcome: Progressing Towards Goal  Goal: *Oxygen saturation within defined limits  Outcome: Progressing Towards Goal  Goal: *Hemodynamically stable  Outcome: Progressing Towards Goal  Goal: *Vital signs within defined limits  Outcome: Progressing Towards Goal  Goal: *Tolerating diet  Outcome: Progressing Towards Goal  Goal: *Demonstrates progressive activity  Outcome: Progressing Towards Goal  Goal: *Fluid volume maintenance  Outcome: Progressing Towards Goal

## 2021-04-17 NOTE — Progress Notes (Signed)
 Problem: Mobility Impaired (Adult and Pediatric)  Goal: *Acute Goals and Plan of Care (Insert Text)  Description: FUNCTIONAL STATUS PRIOR TO ADMISSION: Patient was modified independent using a single point cane for functional mobility.    HOME SUPPORT PRIOR TO ADMISSION: The patient lived with family but did not require assist.    Physical Therapy Goals  Initiated 04/12/2021  1.  Patient will move from supine to sit and sit to supine , scoot up and down, and roll side to side in bed with independence within 7 day(s).    2.  Patient will transfer from bed to chair and chair to bed with modified independence using the least restrictive device within 7 day(s).  3.  Patient will perform sit to stand with modified independence within 7 day(s).  4.  Patient will ambulate with modified independence for 50 feet with the least restrictive device within 7 day(s).   5.  Patient will ascend/descend 4 stairs with R handrail(s) with supervision/set-up within 7 day(s).    Outcome: Progressing Towards Goal   PHYSICAL THERAPY TREATMENT  Patient: Deborah [name] (66 y.o. female)  Date: 04/17/2021  Diagnosis: Systemic inflammatory response syndrome (SIRS) (HCC) [R65.10] <principal problem not specified>      Precautions:    Chart, physical therapy assessment, plan of care and goals were reviewed.    ASSESSMENT  Patient continues with skilled PT services and is progressing slowly towards goals. Agreeable to intervention but remains limited by decreased endurance, impaired balance, DOE and desaturation on 4L O2, c/o right knee pain with gait, and overall decreased reception to education regarding improvement in mobility. Gait training completed x15' using her cane from home while patient maintained on 4L O2 via NC. Her cane is set too high causing a large angulation to the ground and keeping it away from her BOS limiting its usefulness. Gait with increased trunk sway, slow cadence, and DOE causing desaturation to 87% as she returned to  bedside chair. She recovered quickly with PLB in sitting when provided cues.     The patient remains at elevated risk for falls and activity tolerance is limited. Continue to recommend discharge to SNF level rehab    Current Level of Function Impacting Discharge (mobility/balance): CGA-min for transfers and gait, fatigues quickly and c/o DOE           PLAN :  Patient continues to benefit from skilled intervention to address the above impairments.  Continue treatment per established plan of care.  to address goals.    Recommendation for discharge: (in order for the patient to meet his/her long term goals)  Therapy up to 5 days/week in SNF setting    This discharge recommendation:  Has been made in collaboration with the attending provider and/or case management    IF patient discharges home will need the following DME: to be determined (TBD)       SUBJECTIVE:   Patient stated "I prefer it higher."    OBJECTIVE DATA SUMMARY:   Critical Behavior:  Neurologic State: (P) Alert  Orientation Level: (P) Oriented X4  Cognition: (P) Follows commands, Appropriate safety awareness, Appropriate for age attention/concentration, Appropriate decision making  Safety/Judgement: Awareness of environment, Good awareness of safety precautions, Fall prevention, Home safety, Insight into deficits  Functional Mobility Training:  Bed Mobility:     Supine to Sit: Contact guard assistance;Additional time;Bed Modified  Sit to Supine:  (pt seated up in chair at end of session)  Transfers:  Sit to Stand: Minimum assistance  Stand to Sit: Contact guard assistance;Minimum assistance        Bed to Chair: Minimum assistance                    Balance:  Sitting: Intact  Standing: With support (with SPC)  Standing - Static: Constant support;Good  Standing - Dynamic : Fair;Constant support  Ambulation/Gait Training:  Distance (ft): 15 Feet (ft)  Assistive Device: Gait belt;Cane, straight  Ambulation - Level of Assistance: Minimal assistance         Gait Abnormalities: Decreased step clearance;Trunk sway increased;Shuffling gait        Base of Support: Widened     Speed/Cadence: Pace decreased (<100 feet/min)            Activity Tolerance:   Fair, desaturates with exertion and requires oxygen, and observed SOB with activity    After treatment patient left in no apparent distress:   Sitting in chair and Call bell within reach    COMMUNICATION/COLLABORATION:   The patient's plan of care was discussed with: Registered nurse.     Charlie LITTIE Part, PT, DPT   Time Calculation: 16 mins

## 2021-04-17 NOTE — Progress Notes (Signed)
 Spiritual Care Assessment/Progress Note  [facility]      NAME: Deborah [name]      MRN: [id number]  AGE: 66 y.o. SEX: female  Religious Affiliation: Other   Language: English     04/17/2021     Total Time (in minutes): 20     Spiritual Assessment begun in [facility] 3 INTERVNTNL CARE through conversation with:         [x] Patient        []  Family    []  Friend(s)        Reason for Consult: Initial/Spiritual assessment, critical care     Spiritual beliefs: (Please include comment if needed)     []  Identifies with a faith tradition:         []  Supported by a faith community:            []  Claims no spiritual orientation:           []  Seeking spiritual identity:                [x]  Adheres to an individual form of spirituality:           []  Not able to assess:                           Identified resources for coping:      []  Prayer                               []  Music                  []  Guided Imagery     [x]  Family/friends                 []  Pet visits     []  Devotional reading                         []  Unknown     []  Other:                                               Interventions offered during this visit: (See comments for more details)    Patient Interventions: Affirmation of emotions/emotional suffering, Coping skills reviewed/reinforced, Catharsis/review of pertinent events in supportive environment, Religious beliefs/image of God discussed           Plan of Care:     []  Support spiritual and/or cultural needs    []  Support AMD and/or advance care planning process      []  Support grieving process   []  Coordinate Rites and/or Rituals    []  Coordination with community clergy   []  No spiritual needs identified at this time   []  Detailed Plan of Care below (See Comments)  []  Make referral to Music Therapy  []  Make referral to Pet Therapy     []  Make referral to Addiction services  []  Make referral to [facility]  []  Make referral to Spiritual Care Partner  []  No future visits requested        [x]  Contact  Spiritual Care for further referrals     Comments: Chaplain visit for the patient in ICU. Reviewed patient's chart and spoke with patient's nurse. Introduced self  and chaplaincy. Pt shared recent medical events. Pt copes through the support of her family. Pt shared her grandchildren are the light of her life. Pt looking forward to discharge, going to rehab to regain her strength, and then going home to be [place]. No spiritual or emotional concerns at this time. Initiated a relationship of support. Provided an empathetic presence. Offered a listening ear. Facilitated storytelling. Reviewed coping. Advised of chaplain availability. Please contact Spiritual Care for further referrals.    Chaplain Suzen Rudy Guardian, MS, MDiv, [facility]  Staff Chaplain  Paging service: [phone] (PRAY)

## 2021-04-17 NOTE — Progress Notes (Signed)
 Case Management note:    RUR 20%    LOS 8 days,GLOS 5 days    Today:  I met with pt and pt.'s son via phone to further discuss disposition choices.  Due to location with his job,son prefers the [facility].  I updated the liason,Pam with [place] that pt has decided on the [facility].  I discussed pt with pulmonology NP who has ordered BiPap for SNF.  Pt refused the BiPaP last night.  I  notified the SNF that pt has intermittently refused the BiPap here .  In reviewing ID's note from yesterday,the  plan is for pt to eventually discharge on iv ceftriaxone as outlined in Dr Orion post discharge PICC and antibiotic orders for treatment of her Group B streptococcus bacteremia wire infection.  His notes indicate the need also for oral metronidazole.  ID recommends CT of abdomen,pelvis,and maxillofacial areas .    BiPaP orders sent to admissions @ the [facility].    Per attending,I have requested for [facility] to start insurance authorization later today or tomorrow am.      Elizabeth Aristov,RN

## 2021-04-18 ENCOUNTER — Encounter: Attending: Internal Medicine | Primary: Family

## 2021-04-18 ENCOUNTER — Encounter: Primary: Family

## 2021-04-18 LAB — GLUCOSE, POC
Glucose (POC): 224 mg/dL — ABNORMAL HIGH (ref 65–117)
Glucose (POC): 161 mg/dL — ABNORMAL HIGH (ref 65–117)
Glucose (POC): 211 mg/dL — ABNORMAL HIGH (ref 65–117)
Glucose (POC): 201 mg/dL — ABNORMAL HIGH (ref 65–117)

## 2021-04-18 LAB — METABOLIC PANEL, BASIC
Anion gap: 1 mmol/L — ABNORMAL LOW (ref 5–15)
BUN/Creatinine ratio: 28 — ABNORMAL HIGH (ref 12–20)
BUN: 27 MG/DL — ABNORMAL HIGH (ref 6–20)
CO2: 34 mmol/L — ABNORMAL HIGH (ref 21–32)
Calcium: 8.7 MG/DL (ref 8.5–10.1)
Chloride: 102 mmol/L (ref 97–108)
Creatinine: 0.98 MG/DL (ref 0.55–1.02)
Glucose: 167 mg/dL — ABNORMAL HIGH (ref 65–100)
Potassium: 4.5 mmol/L (ref 3.5–5.1)
Sodium: 137 mmol/L (ref 136–145)
eGFR: >60 mL/min/{1.73_m2} (ref >60)

## 2021-04-18 LAB — CBC WITH AUTOMATED DIFF
ABS. BASOPHILS: 0 10*3/uL (ref 0.0–0.1)
ABS. EOSINOPHILS: 0.1 10*3/uL (ref 0.0–0.4)
ABS. IMM. GRANS.: 0.1 10*3/uL — ABNORMAL HIGH (ref 0.00–0.04)
ABS. LYMPHOCYTES: 1.2 10*3/uL (ref 0.8–3.5)
ABS. MONOCYTES: 0.6 10*3/uL (ref 0.0–1.0)
ABS. NEUTROPHILS: 4.5 10*3/uL (ref 1.8–8.0)
ABSOLUTE NRBC: 0 10*3/uL (ref 0.00–0.01)
BASOPHILS: 0 % (ref 0–1)
EOSINOPHILS: 2 % (ref 0–7)
HCT: 29 % — ABNORMAL LOW (ref 35.0–47.0)
HGB: 9.1 g/dL — ABNORMAL LOW (ref 11.5–16.0)
IMMATURE GRANULOCYTES: 2 % — ABNORMAL HIGH (ref 0.0–0.5)
LYMPHOCYTES: 18 % (ref 12–49)
MCH: 29 PG (ref 26.0–34.0)
MCHC: 31.4 g/dL (ref 30.0–36.5)
MCV: 92.4 FL (ref 80.0–99.0)
MONOCYTES: 9 % (ref 5–13)
MPV: 10.9 FL (ref 8.9–12.9)
NEUTROPHILS: 69 % (ref 32–75)
NRBC: 0 PER 100 WBC
PLATELET: 118 10*3/uL — ABNORMAL LOW (ref 150–400)
RBC: 3.14 M/uL — ABNORMAL LOW (ref 3.80–5.20)
RDW: 14.1 % (ref 11.5–14.5)
WBC: 6.5 10*3/uL (ref 3.6–11.0)

## 2021-04-18 LAB — CULTURE, BLOOD, PAIRED
Culture result:: NO GROWTH
Culture: NO GROWTH

## 2021-04-18 LAB — MAGNESIUM
Magnesium: 1.8 mg/dL (ref 1.6–2.4)
Magnesium: 1.8 mg/dL (ref 1.6–2.4)

## 2021-04-18 LAB — SAMPLES BEING HELD

## 2021-04-18 LAB — CBC WITH AUTO DIFFERENTIAL
Basophils %: 0 % (ref 0–1)
Basophils Absolute: 0 10*3/uL (ref 0.0–0.1)
Eosinophils %: 2 % (ref 0–7)
Eosinophils Absolute: 0.1 10*3/uL (ref 0.0–0.4)
Granulocyte Absolute Count: 0.1 10*3/uL — ABNORMAL HIGH (ref 0.00–0.04)
Hematocrit: 29 % — ABNORMAL LOW (ref 35.0–47.0)
Hemoglobin: 9.1 g/dL — ABNORMAL LOW (ref 11.5–16.0)
Immature Granulocytes: 2 % — ABNORMAL HIGH (ref 0.0–0.5)
Lymphocytes %: 18 % (ref 12–49)
Lymphocytes Absolute: 1.2 10*3/uL (ref 0.8–3.5)
MCH: 29 PG (ref 26.0–34.0)
MCHC: 31.4 g/dL (ref 30.0–36.5)
MCV: 92.4 FL (ref 80.0–99.0)
MPV: 10.9 FL (ref 8.9–12.9)
Monocytes %: 9 % (ref 5–13)
Monocytes Absolute: 0.6 10*3/uL (ref 0.0–1.0)
NRBC Absolute: 0 10*3/uL (ref 0.00–0.01)
Neutrophils %: 69 % (ref 32–75)
Neutrophils Absolute: 4.5 10*3/uL (ref 1.8–8.0)
Nucleated RBCs: 0 PER 100 WBC
Platelets: 118 10*3/uL — ABNORMAL LOW (ref 150–400)
RBC: 3.14 M/uL — ABNORMAL LOW (ref 3.80–5.20)
RDW: 14.1 % (ref 11.5–14.5)
WBC: 6.5 10*3/uL (ref 3.6–11.0)

## 2021-04-18 LAB — BASIC METABOLIC PANEL
Anion Gap: 1 mmol/L — ABNORMAL LOW (ref 5–15)
BUN: 27 MG/DL — ABNORMAL HIGH (ref 6–20)
Bun/Cre Ratio: 28 — ABNORMAL HIGH (ref 12–20)
CO2: 34 mmol/L — ABNORMAL HIGH (ref 21–32)
Calcium: 8.7 MG/DL (ref 8.5–10.1)
Chloride: 102 mmol/L (ref 97–108)
Creatinine: 0.98 MG/DL (ref 0.55–1.02)
ESTIMATED GLOMERULAR FILTRATION RATE: >60 mL/min/{1.73_m2} (ref >60)
Glucose: 167 mg/dL — ABNORMAL HIGH (ref 65–100)
Potassium: 4.5 mmol/L (ref 3.5–5.1)
Sodium: 137 mmol/L (ref 136–145)

## 2021-04-18 LAB — POCT GLUCOSE
POC Glucose: 224 mg/dL — ABNORMAL HIGH (ref 65–117)
POC Glucose: 161 mg/dL — ABNORMAL HIGH (ref 65–117)
POC Glucose: 211 mg/dL — ABNORMAL HIGH (ref 65–117)
POC Glucose: 201 mg/dL — ABNORMAL HIGH (ref 65–117)

## 2021-04-18 MED ORDER — FUROSEMIDE 10 MG/ML IJ SOLN
10 mg/mL | Freq: Once | INTRAMUSCULAR | Status: AC
Start: 2021-04-18 — End: 2021-04-18
  Administered 2021-04-18: 16:00:00 via INTRAVENOUS

## 2021-04-18 MED FILL — ATORVASTATIN 10 MG TAB: 10 mg | ORAL | Qty: 1

## 2021-04-18 MED FILL — INSULIN LISPRO 100 UNIT/ML INJECTION: 100 unit/mL | SUBCUTANEOUS | Qty: 2

## 2021-04-18 MED FILL — CEFEPIME 2 GRAM SOLUTION FOR INJECTION: 2 gram | INTRAMUSCULAR | Qty: 2

## 2021-04-18 MED FILL — METRONIDAZOLE 250 MG TAB: 250 mg | ORAL | Qty: 2

## 2021-04-18 MED FILL — HYDROMORPHONE 0.5 MG/0.5 ML SYRINGE: 0.5 mg/ mL | INTRAMUSCULAR | Qty: 0.5

## 2021-04-18 MED FILL — ACETAMINOPHEN 325 MG TABLET: 325 mg | ORAL | Qty: 2

## 2021-04-18 MED FILL — LEVOTHYROXINE 75 MCG TAB: 75 mcg | ORAL | Qty: 2

## 2021-04-18 MED FILL — ISOVUE-370  76 % INTRAVENOUS SOLUTION: 370 mg iodine /mL (76 %) | INTRAVENOUS | Qty: 100

## 2021-04-18 MED FILL — CARAFATE 1 GRAM TABLET: 1 gram | ORAL | Qty: 1

## 2021-04-18 MED FILL — HEPARIN (PORCINE) 5,000 UNIT/ML IJ SOLN: 5000 unit/mL | INTRAMUSCULAR | Qty: 1

## 2021-04-18 MED FILL — INSULIN LISPRO 100 UNIT/ML INJECTION: 100 unit/mL | SUBCUTANEOUS | Qty: 1

## 2021-04-18 MED FILL — ARFORMOTEROL 15 MCG/2 ML NEB SOLUTION: 15 mcg/2 mL | RESPIRATORY_TRACT | Qty: 2

## 2021-04-18 MED FILL — CITALOPRAM 20 MG TAB: 20 mg | ORAL | Qty: 2

## 2021-04-18 MED FILL — FUROSEMIDE 10 MG/ML IJ SOLN: 10 mg/mL | INTRAMUSCULAR | Qty: 4

## 2021-04-18 MED FILL — MONTELUKAST 10 MG TAB: 10 mg | ORAL | Qty: 1

## 2021-04-18 MED FILL — TRAZODONE 50 MG TAB: 50 mg | ORAL | Qty: 2

## 2021-04-18 MED FILL — OXYCODONE 5 MG TAB: 5 mg | ORAL | Qty: 1

## 2021-04-18 MED FILL — METOPROLOL SUCCINATE SR 25 MG 24 HR TAB: 25 mg | ORAL | Qty: 1

## 2021-04-18 MED FILL — ASPIRIN 81 MG CHEWABLE TAB: 81 mg | ORAL | Qty: 1

## 2021-04-18 MED FILL — BUDESONIDE 0.5 MG/2 ML NEB SUSPENSION: 0.5 mg/2 mL | RESPIRATORY_TRACT | Qty: 1

## 2021-04-18 MED FILL — LIDOCAINE 4 % TOPICAL PATCH (12 HOUR DURATION): 4 % | CUTANEOUS | Qty: 2

## 2021-04-18 NOTE — Progress Notes (Signed)
 04/17/21: Bedside and Verbal shift change report given to Tisyl,RN (oncoming nurse) by Alberta PEAK (offgoing nurse). Report included the following information SBAR, Kardex, ED Summary, Procedure Summary, Intake/Output, MAR, Recent Results, Med Rec Status, Cardiac Rhythm  , and Alarm Parameters .     04/18/21: Bedside and Verbal shift change report given to Lauren,R. RN (oncoming nurse) by Lanie PEAK (offgoing nurse). Report included the following information SBAR, Kardex, ED Summary, Procedure Summary, Intake/Output, MAR, Recent Results, Med Rec Status, Cardiac Rhythm  , and Alarm Parameters .

## 2021-04-18 NOTE — Progress Notes (Signed)
 Problem: Self Care Deficits Care Plan (Adult)  Goal: *Acute Goals and Plan of Care (Insert Text)  Description: FUNCTIONAL STATUS PRIOR TO ADMISSION: Patient was modified independent using a single point cane for functional mobility. Patient was modified independent for basic and instrumental ADLs. No baseline O2 use.     HOME SUPPORT: The patient lived with her son and his family. Pt reports being home alone during the day.    Occupational Therapy Goals  Initiated 04/12/2021  1.  Patient will perform grooming, standing at sink, with modified independence within 7 day(s).  2.  Patient will perform lower body dressing with supervision/set-up using AD PRN within 7 day(s).  3.  Patient will perform bathing with supervision/set-up within 7 day(s).  4.  Patient will perform toilet transfers with modified independence within 7 day(s).  5.  Patient will perform all aspects of toileting with modified independence within 7 day(s).  6.  Patient will participate in upper extremity therapeutic exercise/activities with independence for 10 minutes within 7 day(s).    7.  Patient will utilize energy conservation techniques during functional activities with verbal cues within 7 day(s).   Outcome: Progressing Towards Goal   OCCUPATIONAL THERAPY TREATMENT  Patient: Deborah [name] (66 y.o. female)  Date: 04/18/2021  Diagnosis: Systemic inflammatory response syndrome (SIRS) (HCC) [R65.10] <principal problem not specified>      Precautions:  fall  Chart, occupational therapy assessment, plan of care, and goals were reviewed.    ASSESSMENT  Patient continues with skilled OT services and is progressing towards goals.  Ms. Moening was received in the chair agreeable to activity.  She was received on 2L O2 but required increase to 3L for activity to maintain SpO2 >90%.  Education provided regarding energy conservation, pacing, and pursed lip breathing techniques; Min verbal cues needed for carryover.  Patient performed x3 sit to stand transfers  and ambulation into the bathroom for ADL retraining.  She engaged in toileting with fair tolerance with frequent rest breaks required.  Patient returned to the chair to complete grooming tasks with setup.  Patient agreeable to remain up to the chair at end of tx.        Current Level of Function Impacting Discharge (ADLs): Functional mobility, CGA- Min A; Toileting, CGA-Min A; Grooming (seated), setup       PLAN :  Patient continues to benefit from skilled intervention to address the above impairments.  Continue treatment per established plan of care to address goals.    Recommend with staff:   Recommend patient be OOB to chair as frequently as tolerated; Goal of 3x/day for all meals for 60 minutes at a time.   For toileting needs, recommend transfers to/from bathroom with staff assist.  Encourage patient involvement in personal care as able.    Encourage exercises frequently throughout the day.       Recommend next OT session: Continue towards set OT goals.      Recommendation for discharge: (in order for the patient to meet his/her long term goals)  Therapy up to 5 days/week in SNF setting    This discharge recommendation:  Has been made in collaboration with the attending provider and/or case management    IF patient discharges home will need the following DME: none       SUBJECTIVE:   Patient agreeable to OT tx.      OBJECTIVE DATA SUMMARY:   Cognitive/Behavioral Status:  Neurologic State: Alert  Orientation Level: Oriented X4  Cognition: Follows commands  Perception:  Appears intact  Perseveration: No perseveration noted  Safety/Judgement: Awareness of environment    Functional Mobility and Transfers for ADLs:  Bed Mobility:  Supine to Sit:  (pt was received up and remained up)  Scooting: Additional time;Contact guard assistance;Adaptive equipment    Transfers:  Sit to Stand: Minimum assistance;Additional time  Functional Transfers  Toilet Transfer : Minimum assistance;Additional time;Assist x1  Bed to Chair:  Minimum assistance;Additional time    Balance:  Sitting: Intact  Sitting - Static: Good (unsupported)  Sitting - Dynamic: Good (unsupported)  Standing: Impaired  Standing - Static: Fair  Standing - Dynamic : Fair    ADL Intervention:  Grooming  Grooming Assistance: Set-up  Position Performed: Seated in chair  Washing Face: Set-up  Washing Hands: Set-up    Toileting  Toileting Assistance: Minimum assistance  Bladder Hygiene: Contact guard assistance  Bowel Hygiene: Contact guard assistance  Clothing Management: Minimum assistance  Adaptive Equipment: Grab bars    Cognitive Retraining  Safety/Judgement: Awareness of environment    Activity Tolerance:   Good and Fair    After treatment patient left in no apparent distress:   Sitting in chair, Call bell within reach, and Bed / chair alarm activated    COMMUNICATION/COLLABORATION:   The patient's plan of care was discussed with: Physical therapist, Registered nurse, and patient .     Ronal Done, OTR/L  Time Calculation: 38 mins

## 2021-04-18 NOTE — Progress Notes (Signed)
 Problem: Heart Failure: Day 1  Goal: Off Pathway (Use only if patient is Off Pathway)  Outcome: Progressing Towards Goal  Goal: Activity/Safety  Outcome: Progressing Towards Goal  Goal: Consults, if ordered  Outcome: Progressing Towards Goal  Goal: Diagnostic Test/Procedures  Outcome: Progressing Towards Goal  Goal: Nutrition/Diet  Outcome: Progressing Towards Goal

## 2021-04-18 NOTE — Progress Notes (Signed)
 Problem: Mobility Impaired (Adult and Pediatric)  Goal: *Acute Goals and Plan of Care (Insert Text)  Description: FUNCTIONAL STATUS PRIOR TO ADMISSION: Patient was modified independent using a single point cane for functional mobility.    HOME SUPPORT PRIOR TO ADMISSION: The patient lived with family but did not require assist.    Physical Therapy Goals  Initiated 04/12/2021  1.  Patient will move from supine to sit and sit to supine , scoot up and down, and roll side to side in bed with independence within 7 day(s).    2.  Patient will transfer from bed to chair and chair to bed with modified independence using the least restrictive device within 7 day(s).  3.  Patient will perform sit to stand with modified independence within 7 day(s).  4.  Patient will ambulate with modified independence for 50 feet with the least restrictive device within 7 day(s).   5.  Patient will ascend/descend 4 stairs with R handrail(s) with supervision/set-up within 7 day(s).    Outcome: Not Met   PHYSICAL THERAPY TREATMENT  Patient: Deborah [name] (66 y.o. female)  Date: 04/18/2021  Diagnosis: Systemic inflammatory response syndrome (SIRS) (HCC) [R65.10] <principal problem not specified>      Precautions:  Falls; skin  Chart, physical therapy assessment, plan of care and goals were reviewed.    ASSESSMENT  Patient continues with skilled PT services and is progressing towards goals. She ambulates increased total distance with standing rest breaks and altered mechanics. SpO2 85% with activity using 2LNCO2 as found, 94% using 4LNCO2, and 97% at rest using 2LNCO2 after treatment. Reviewed seated exercises and importance of out of bed positioning.     Current Level of Function Impacting Discharge (mobility/balance): min A    Other factors to consider for discharge: none additional         PLAN :  Patient continues to benefit from skilled intervention to address the above impairments.  Continue treatment per established plan of care.  to address  goals.    Recommendation for discharge: (in order for the patient to meet his/her long term goals)  Therapy up to 5 days/week in SNF setting    This discharge recommendation:  Has been made in collaboration with the attending provider and/or case management    IF patient discharges home will need the following DME: to be determined (TBD)       SUBJECTIVE:   Patient stated "My knees are just stiff."    Pt received supine, agreeable to PT and cleared by RN.    OBJECTIVE DATA SUMMARY:   Critical Behavior:  Neurologic State: Alert  Orientation Level: Oriented X4  Cognition: Follows commands  Safety/Judgement: Awareness of environment, Good awareness of safety precautions, Fall prevention, Home safety, Insight into deficits  Functional Mobility Training:  Bed Mobility:     Supine to Sit: Additional time;Contact guard assistance;Adaptive equipment;Bed Modified     Scooting: Additional time;Contact guard assistance;Adaptive equipment        Transfers:  Sit to Stand: Minimum assistance;Additional time (multiple trials for success)  Stand to Sit: Contact guard assistance        Bed to Chair: Minimum assistance;Additional time                    Balance:  Sitting: Without support  Sitting - Static: Good (unsupported)  Sitting - Dynamic: Good (unsupported)  Standing: With support  Standing - Static: Fair  Standing - Dynamic : Fair  Ambulation/Gait Training:  Distance (ft):  (  10', seated rest + 15' x 2 with standing rest)  Assistive Device: Gait belt;Cane, straight  Ambulation - Level of Assistance: Minimal assistance        Gait Abnormalities: Decreased step clearance        Base of Support: Widened     Speed/Cadence: Pace decreased (<100 feet/min)                         Therapeutic Exercises:   Ankle pumps x 10  LAQ x 10  Pain Rating:  7/10 BLE pain associated with baseline neuropathy  RN aware; offered rest, repositioning, education, encouragement.    Activity Tolerance:   requires frequent rest breaks    After treatment  patient left in no apparent distress:   Sitting in chair, Call bell within reach, Bed / chair alarm activated, and LEs elevated    COMMUNICATION/COLLABORATION:   The patient's plan of care was discussed with: Registered nurse.     Suzen FORBES Marina, PT, DPT   Time Calculation: 40 mins

## 2021-04-18 NOTE — Progress Notes (Signed)
 Care Management follow up    Patient admitted for Septic shock, GBS bacteremia, acute hypoxic respiratory failure.  History of: CHF, AVR, asthma, COPD, CKD, GERD, diabetes, hypothyroidism.    RUR 18 (Score %) moderate   Is This a Readmission NO  Is this a Bundle NO    Current status  Patient discussed during interdisciplinary rounds.  Patient continues to require medical management including ongoing assessment and monitoring.  Spoke with Si at [facility], insurance authorization was initiated this am.      Transition of Care Plan  Monitor patient status and response to treatment.  Patient continues to require medical management.  CM needs: SNF placement  [facility] started insurance authorization  Stretcher transport at DC  CM to monitor progress and recommendations.    Glendale Girt, RN, MSN/Care manager

## 2021-04-18 NOTE — Progress Notes (Signed)
 0700: Bedside and Verbal shift change report given to Lauren RN (oncoming nurse) by Honor tisyl (offgoing nurse). Report included the following information SBAR, Kardex, Accordion, and Cardiac Rhythm A Paced .    This patient was assisted with Intentional Toileting every 2 hours during this shift as appropriate.  Documentation of ambulation and output reflected on Flowsheet as appropriate.  Purposeful hourly rounding was completed using AIDET and 5Ps.  Outcomes of PHR documented as they occurred. Bed alarm in use as appropriate.  Dual Suction and ambubag in place.

## 2021-04-19 ENCOUNTER — Inpatient Hospital Stay: Admit: 2021-04-19 | Payer: MEDICARE | Primary: Family

## 2021-04-19 ENCOUNTER — Inpatient Hospital Stay: Admit: 2021-04-19 | Payer: MEDICARE | Attending: Infectious Disease | Primary: Family

## 2021-04-19 LAB — CBC WITH AUTOMATED DIFF
ABS. BASOPHILS: 0 10*3/uL (ref 0.0–0.1)
ABS. EOSINOPHILS: 0.2 10*3/uL (ref 0.0–0.4)
ABS. IMM. GRANS.: 0.1 10*3/uL — ABNORMAL HIGH (ref 0.00–0.04)
ABS. LYMPHOCYTES: 1.6 10*3/uL (ref 0.8–3.5)
ABS. MONOCYTES: 0.6 10*3/uL (ref 0.0–1.0)
ABS. NEUTROPHILS: 5.4 10*3/uL (ref 1.8–8.0)
ABSOLUTE NRBC: 0 10*3/uL (ref 0.00–0.01)
BASOPHILS: 0 % (ref 0–1)
EOSINOPHILS: 2 % (ref 0–7)
HCT: 29.9 % — ABNORMAL LOW (ref 35.0–47.0)
HGB: 9.4 g/dL — ABNORMAL LOW (ref 11.5–16.0)
IMMATURE GRANULOCYTES: 1 % — ABNORMAL HIGH (ref 0.0–0.5)
LYMPHOCYTES: 20 % (ref 12–49)
MCH: 29 PG (ref 26.0–34.0)
MCHC: 31.4 g/dL (ref 30.0–36.5)
MCV: 92.3 FL (ref 80.0–99.0)
MONOCYTES: 8 % (ref 5–13)
MPV: 11.2 FL (ref 8.9–12.9)
NEUTROPHILS: 69 % (ref 32–75)
NRBC: 0 PER 100 WBC
PLATELET: 134 10*3/uL — ABNORMAL LOW (ref 150–400)
RBC: 3.24 M/uL — ABNORMAL LOW (ref 3.80–5.20)
RDW: 14 % (ref 11.5–14.5)
WBC: 7.8 10*3/uL (ref 3.6–11.0)

## 2021-04-19 LAB — GLUCOSE, POC
Glucose (POC): 217 mg/dL — ABNORMAL HIGH (ref 65–117)
Glucose (POC): 164 mg/dL — ABNORMAL HIGH (ref 65–117)
Glucose (POC): 293 mg/dL — ABNORMAL HIGH (ref 65–117)
Glucose (POC): 173 mg/dL — ABNORMAL HIGH (ref 65–117)

## 2021-04-19 LAB — METABOLIC PANEL, BASIC
Anion gap: 2 mmol/L — ABNORMAL LOW (ref 5–15)
BUN/Creatinine ratio: 23 — ABNORMAL HIGH (ref 12–20)
BUN: 24 MG/DL — ABNORMAL HIGH (ref 6–20)
CO2: 35 mmol/L — ABNORMAL HIGH (ref 21–32)
Calcium: 9.3 MG/DL (ref 8.5–10.1)
Chloride: 99 mmol/L (ref 97–108)
Creatinine: 1.05 MG/DL — ABNORMAL HIGH (ref 0.55–1.02)
Glucose: 159 mg/dL — ABNORMAL HIGH (ref 65–100)
Potassium: 4.8 mmol/L (ref 3.5–5.1)
Sodium: 136 mmol/L (ref 136–145)
eGFR: 59 mL/min/{1.73_m2} — ABNORMAL LOW (ref >60)

## 2021-04-19 LAB — MAGNESIUM
Magnesium: 1.8 mg/dL (ref 1.6–2.4)
Magnesium: 1.8 mg/dL (ref 1.6–2.4)

## 2021-04-19 LAB — CBC WITH AUTO DIFFERENTIAL
Basophils %: 0 % (ref 0–1)
Basophils Absolute: 0 10*3/uL (ref 0.0–0.1)
Eosinophils %: 2 % (ref 0–7)
Eosinophils Absolute: 0.2 10*3/uL (ref 0.0–0.4)
Granulocyte Absolute Count: 0.1 10*3/uL — ABNORMAL HIGH (ref 0.00–0.04)
Hematocrit: 29.9 % — ABNORMAL LOW (ref 35.0–47.0)
Hemoglobin: 9.4 g/dL — ABNORMAL LOW (ref 11.5–16.0)
Immature Granulocytes: 1 % — ABNORMAL HIGH (ref 0.0–0.5)
Lymphocytes %: 20 % (ref 12–49)
Lymphocytes Absolute: 1.6 10*3/uL (ref 0.8–3.5)
MCH: 29 PG (ref 26.0–34.0)
MCHC: 31.4 g/dL (ref 30.0–36.5)
MCV: 92.3 FL (ref 80.0–99.0)
MPV: 11.2 FL (ref 8.9–12.9)
Monocytes %: 8 % (ref 5–13)
Monocytes Absolute: 0.6 10*3/uL (ref 0.0–1.0)
NRBC Absolute: 0 10*3/uL (ref 0.00–0.01)
Neutrophils %: 69 % (ref 32–75)
Neutrophils Absolute: 5.4 10*3/uL (ref 1.8–8.0)
Nucleated RBCs: 0 PER 100 WBC
Platelets: 134 10*3/uL — ABNORMAL LOW (ref 150–400)
RBC: 3.24 M/uL — ABNORMAL LOW (ref 3.80–5.20)
RDW: 14 % (ref 11.5–14.5)
WBC: 7.8 10*3/uL (ref 3.6–11.0)

## 2021-04-19 LAB — POCT GLUCOSE
POC Glucose: 217 mg/dL — ABNORMAL HIGH (ref 65–117)
POC Glucose: 164 mg/dL — ABNORMAL HIGH (ref 65–117)
POC Glucose: 293 mg/dL — ABNORMAL HIGH (ref 65–117)
POC Glucose: 173 mg/dL — ABNORMAL HIGH (ref 65–117)

## 2021-04-19 LAB — BASIC METABOLIC PANEL
Anion Gap: 2 mmol/L — ABNORMAL LOW (ref 5–15)
BUN: 24 MG/DL — ABNORMAL HIGH (ref 6–20)
Bun/Cre Ratio: 23 — ABNORMAL HIGH (ref 12–20)
CO2: 35 mmol/L — ABNORMAL HIGH (ref 21–32)
Calcium: 9.3 MG/DL (ref 8.5–10.1)
Chloride: 99 mmol/L (ref 97–108)
Creatinine: 1.05 MG/DL — ABNORMAL HIGH (ref 0.55–1.02)
ESTIMATED GLOMERULAR FILTRATION RATE: 59 mL/min/{1.73_m2} — ABNORMAL LOW (ref >60)
Glucose: 159 mg/dL — ABNORMAL HIGH (ref 65–100)
Potassium: 4.8 mmol/L (ref 3.5–5.1)
Sodium: 136 mmol/L (ref 136–145)

## 2021-04-19 MED ORDER — TECHNETIUM TC-99M EXAMETAZIME 0.5 MG INTRAVENOUS SOLUTION
0.5 mg | Freq: Once | INTRAVENOUS | Status: AC
Start: 2021-04-19 — End: 2021-04-19
  Administered 2021-04-19: 19:00:00 via INTRAVENOUS

## 2021-04-19 MED ORDER — HEPARIN (PORCINE) 1,000 UNIT/ML IJ SOLN
1000 unit/mL | Freq: Once | INTRAMUSCULAR | Status: AC
Start: 2021-04-19 — End: 2021-04-19

## 2021-04-19 MED ORDER — HEPARIN (PORCINE) 1,000 UNIT/ML IJ SOLN
1000 unit/mL | INTRAMUSCULAR | Status: AC
Start: 2021-04-19 — End: 2021-04-19

## 2021-04-19 MED FILL — HEPARIN (PORCINE) 5,000 UNIT/ML IJ SOLN: 5000 unit/mL | INTRAMUSCULAR | Qty: 1

## 2021-04-19 MED FILL — INSULIN LISPRO 100 UNIT/ML INJECTION: 100 unit/mL | SUBCUTANEOUS | Qty: 1

## 2021-04-19 MED FILL — ARFORMOTEROL 15 MCG/2 ML NEB SOLUTION: 15 mcg/2 mL | RESPIRATORY_TRACT | Qty: 2

## 2021-04-19 MED FILL — BUDESONIDE 0.5 MG/2 ML NEB SUSPENSION: 0.5 mg/2 mL | RESPIRATORY_TRACT | Qty: 1

## 2021-04-19 MED FILL — CITALOPRAM 20 MG TAB: 20 mg | ORAL | Qty: 2

## 2021-04-19 MED FILL — METOPROLOL SUCCINATE SR 25 MG 24 HR TAB: 25 mg | ORAL | Qty: 1

## 2021-04-19 MED FILL — CARAFATE 1 GRAM TABLET: 1 gram | ORAL | Qty: 1

## 2021-04-19 MED FILL — CEFEPIME 2 GRAM SOLUTION FOR INJECTION: 2 gram | INTRAMUSCULAR | Qty: 2

## 2021-04-19 MED FILL — ACETAMINOPHEN 325 MG TABLET: 325 mg | ORAL | Qty: 2

## 2021-04-19 MED FILL — METRONIDAZOLE 250 MG TAB: 250 mg | ORAL | Qty: 2

## 2021-04-19 MED FILL — HEPARIN (PORCINE) 1,000 UNIT/ML IJ SOLN: 1000 unit/mL | INTRAMUSCULAR | Qty: 30

## 2021-04-19 MED FILL — MONTELUKAST 10 MG TAB: 10 mg | ORAL | Qty: 1

## 2021-04-19 MED FILL — LEVOTHYROXINE 75 MCG TAB: 75 mcg | ORAL | Qty: 2

## 2021-04-19 MED FILL — OXYCODONE 5 MG TAB: 5 mg | ORAL | Qty: 1

## 2021-04-19 MED FILL — INSULIN LISPRO 100 UNIT/ML INJECTION: 100 unit/mL | SUBCUTANEOUS | Qty: 3

## 2021-04-19 MED FILL — CERETEC 0.5 MG INTRAVENOUS SOLUTION: 0.5 mg | INTRAVENOUS | Qty: 9

## 2021-04-19 MED FILL — ATORVASTATIN 10 MG TAB: 10 mg | ORAL | Qty: 1

## 2021-04-19 MED FILL — ASPIRIN 81 MG CHEWABLE TAB: 81 mg | ORAL | Qty: 1

## 2021-04-19 MED FILL — LIDOCAINE 4 % TOPICAL PATCH (12 HOUR DURATION): 4 % | CUTANEOUS | Qty: 2

## 2021-04-19 MED FILL — TRAZODONE 50 MG TAB: 50 mg | ORAL | Qty: 2

## 2021-04-19 NOTE — Progress Notes (Signed)
Problem: Mobility Impaired (Adult and Pediatric)  Goal: *Acute Goals and Plan of Care (Insert Text)  Description: FUNCTIONAL STATUS PRIOR TO ADMISSION: Patient was modified independent using a single point cane for functional mobility.    HOME SUPPORT PRIOR TO ADMISSION: The patient lived with family but did not require assist.    Physical Therapy Goals  Initiated 04/12/2021  1.  Patient will move from supine to sit and sit to supine , scoot up and down, and roll side to side in bed with independence within 7 day(s).    2.  Patient will transfer from bed to chair and chair to bed with modified independence using the least restrictive device within 7 day(s).  3.  Patient will perform sit to stand with modified independence within 7 day(s).  4.  Patient will ambulate with modified independence for 50 feet with the least restrictive device within 7 day(s).   5.  Patient will ascend/descend 4 stairs with R handrail(s) with supervision/set-up within 7 day(s).    Outcome: Progressing Towards Goal   PHYSICAL THERAPY TREATMENT  Patient: Deborah [name] (66 y.o. female)  Date: 04/19/2021  Diagnosis: Systemic inflammatory response syndrome (SIRS) (HCC) [R65.10] <principal problem not specified>      Precautions:  fall  Chart, physical therapy assessment, plan of care and goals were reviewed.    ASSESSMENT  Patient continues with skilled PT services and is progressing towards goals. Communicated with nurse cleared for therapy. Patient sitting on the recliner when received, sit to stand CGA, ambulate with single point cane in the room and around the bed. Assisted back to the recliner. Performed some active range of motion exercise on both LE all plane. Educate and instructed patient to continue active range of motion exercise on both legs while up on chair or on bed multiple times. Recommend patient to be up on the chair at least 3 times a day every meal times as tolerated. Activated chair alarm and notified nurse who agreed to  continue to monitor patient.    Documentation for home O2:     (2    ) LITERS OF O2    AT REST   O2 SATS  97% HR     ROOM AIR AT REST 02 SATS  94% HR     ROOM AIR WITH ACTIVITY O2 SATS  88% HR     (2    )LITERS OF 02 PATIENT LEFT COMFORTABLY  SITTING/SUPINE 02 SATS  93% HR        Current Level of Function Impacting Discharge (mobility/balance): CGA with transfers and ambulation using a single point cane    Other factors to consider for discharge: fall         PLAN :  Patient continues to benefit from skilled intervention to address the above impairments.  Continue treatment per established plan of care.  to address goals.    Recommendation for discharge: (in order for the patient to meet his/her long term goals)  Therapy up to 5 days/week in SNF setting    This discharge recommendation:  Has been made in collaboration with the attending provider and/or case management    IF patient discharges home will need the following DME: patient owns DME required for discharge       SUBJECTIVE:   Patient stated "ok."    OBJECTIVE DATA SUMMARY:   Critical Behavior:  Neurologic State: Alert, Appropriate for age  Orientation Level: Oriented X4  Cognition: Appropriate decision making, Follows commands, Appropriate safety awareness  Safety/Judgement: Awareness of environment  Functional Mobility Training:  Bed Mobility:  Rolling:  (already up on the chair when received.)                 Transfers:  Sit to Stand: Contact guard assistance  Stand to Sit: Contact guard assistance  Stand Pivot Transfers: Contact guard assistance     Bed to Chair: Contact guard assistance                    Balance:  Sitting: Intact  Sitting - Static: Good (unsupported)  Sitting - Dynamic: Good (unsupported)  Standing: Impaired;With support  Standing - Static: Good  Standing - Dynamic : Fair  Ambulation/Gait Training:  Distance (ft): 10 Feet (ft)  Assistive Device: Cane, straight;Gait belt  Ambulation - Level of Assistance: Contact guard assistance      Gait Description (WDL): Exceptions to WDL  Gait Abnormalities: Path deviations;Step to gait        Base of Support: Widened     Speed/Cadence: Pace decreased (<100 feet/min)  Step Length: Right shortened;Left shortened                  Therapeutic Exercises:   Educate and instructed patient to continue active range of motion exercise on both legs while up on chair or on bed multiple times. Recommend patient to be up on the chair at least 3 times a day every meal times as tolerated.       Pain Rating:  0/10    Activity Tolerance:   Good    After treatment patient left in no apparent distress:   Sitting in chair, Heels elevated for pressure relief, Call bell within reach, and Bed / chair alarm activated    COMMUNICATION/COLLABORATION:   The patient's plan of care was discussed with: Registered nurse.     Worthy Flank, PT,WCC.   Time Calculation: 23 mins

## 2021-04-19 NOTE — Progress Notes (Signed)
Problem: Falls - Risk of  Goal: *Absence of Falls  Description: Document Deborah [name] Fall Risk and appropriate interventions in the flowsheet.  Outcome: Progressing Towards Goal  Note: Fall Risk Interventions:  Mobility Interventions: Assess mobility with egress test, Patient to call before getting OOB              Elimination Interventions: Bed/chair exit alarm, Call light in reach, Patient to call for help with toileting needs

## 2021-04-19 NOTE — Progress Notes (Signed)
 Problem: Falls - Risk of  Goal: *Absence of Falls  Description: Document Deborah [name] Fall Risk and appropriate interventions in the flowsheet.  Outcome: Progressing Towards Goal  Note: Fall Risk Interventions:  Mobility Interventions: Patient to call before getting OOB, Bed/chair exit alarm, Communicate number of staff needed for ambulation/transfer, Strengthening exercises (ROM-active/passive)              Elimination Interventions: Call light in reach, Bed/chair exit alarm, Patient to call for help with toileting needs, Toileting schedule/hourly rounds              Problem: Pressure Injury - Risk of  Goal: *Prevention of pressure injury  Description: Document Braden Scale and appropriate interventions in the flowsheet.  Outcome: Progressing Towards Goal  Note: Pressure Injury Interventions:  Sensory Interventions: Assess changes in LOC    Moisture Interventions: Absorbent underpads    Activity Interventions: Increase time out of bed    Mobility Interventions: Turn and reposition approx. every two hours(pillow and wedges)    Nutrition Interventions: Offer support with meals,snacks and hydration    Friction and Shear Interventions: Minimize layers                Problem: Diabetic Ulcer-Treatment  Goal: *Improvement of existing diabetic ulcer  Outcome: Progressing Towards Goal     Problem: Patient Education: Go to Patient Education Activity  Goal: Patient/Family Education  Outcome: Progressing Towards Goal

## 2021-04-19 NOTE — Progress Notes (Signed)
 TOC:   1) SNF placement- [facility] submitted auth   2) Auth pending- will need updated PT/OT notes every 48 hours until auth is determined  3) Pt will need 2ND IM letter at time of discharge   4) Pt will need [facility] transportation vs WC van at time of discharge   5) Risk of readmission- 19% MODERATE     Hospital Day 10:   11:01 AM- Pt remains on [facility]- pending medical stability. CM received a phone call from Tori with [facility]- they requested updated PT/OT notes- updates sent via [facility]. CM will continue to follow and assist as needed.     Ronal Flock, MSW, CM   [facility]

## 2021-04-20 LAB — CBC WITH AUTOMATED DIFF
ABS. BASOPHILS: 0 10*3/uL (ref 0.0–0.1)
ABS. EOSINOPHILS: 0.2 10*3/uL (ref 0.0–0.4)
ABS. IMM. GRANS.: 0.1 10*3/uL — ABNORMAL HIGH (ref 0.00–0.04)
ABS. LYMPHOCYTES: 1.4 10*3/uL (ref 0.8–3.5)
ABS. MONOCYTES: 0.6 10*3/uL (ref 0.0–1.0)
ABS. NEUTROPHILS: 5.1 10*3/uL (ref 1.8–8.0)
ABSOLUTE NRBC: 0 10*3/uL (ref 0.00–0.01)
BASOPHILS: 0 % (ref 0–1)
EOSINOPHILS: 2 % (ref 0–7)
HCT: 28 % — ABNORMAL LOW (ref 35.0–47.0)
HGB: 8.8 g/dL — ABNORMAL LOW (ref 11.5–16.0)
IMMATURE GRANULOCYTES: 1 % — ABNORMAL HIGH (ref 0.0–0.5)
LYMPHOCYTES: 19 % (ref 12–49)
MCH: 28.8 PG (ref 26.0–34.0)
MCHC: 31.4 g/dL (ref 30.0–36.5)
MCV: 91.5 FL (ref 80.0–99.0)
MONOCYTES: 9 % (ref 5–13)
MPV: 10.9 FL (ref 8.9–12.9)
NEUTROPHILS: 69 % (ref 32–75)
NRBC: 0 PER 100 WBC
PLATELET: 135 10*3/uL — ABNORMAL LOW (ref 150–400)
RBC: 3.06 M/uL — ABNORMAL LOW (ref 3.80–5.20)
RDW: 14.3 % (ref 11.5–14.5)
WBC: 7.4 10*3/uL (ref 3.6–11.0)

## 2021-04-20 LAB — MAGNESIUM
Magnesium: 1.8 mg/dL (ref 1.6–2.4)
Magnesium: 1.8 mg/dL (ref 1.6–2.4)

## 2021-04-20 LAB — METABOLIC PANEL, BASIC
Anion gap: 1 mmol/L — ABNORMAL LOW (ref 5–15)
BUN/Creatinine ratio: 27 — ABNORMAL HIGH (ref 12–20)
BUN: 25 MG/DL — ABNORMAL HIGH (ref 6–20)
CO2: 34 mmol/L — ABNORMAL HIGH (ref 21–32)
Calcium: 8.7 MG/DL (ref 8.5–10.1)
Chloride: 100 mmol/L (ref 97–108)
Creatinine: 0.94 MG/DL (ref 0.55–1.02)
Glucose: 154 mg/dL — ABNORMAL HIGH (ref 65–100)
Potassium: 4.8 mmol/L (ref 3.5–5.1)
Sodium: 135 mmol/L — ABNORMAL LOW (ref 136–145)
eGFR: >60 mL/min/{1.73_m2} (ref >60)

## 2021-04-20 LAB — GLUCOSE, POC
Glucose (POC): 163 mg/dL — ABNORMAL HIGH (ref 65–117)
Glucose (POC): 149 mg/dL — ABNORMAL HIGH (ref 65–117)
Glucose (POC): 226 mg/dL — ABNORMAL HIGH (ref 65–117)
Glucose (POC): 173 mg/dL — ABNORMAL HIGH (ref 65–117)

## 2021-04-20 LAB — CBC WITH AUTO DIFFERENTIAL
Basophils %: 0 % (ref 0–1)
Basophils Absolute: 0 10*3/uL (ref 0.0–0.1)
Eosinophils %: 2 % (ref 0–7)
Eosinophils Absolute: 0.2 10*3/uL (ref 0.0–0.4)
Granulocyte Absolute Count: 0.1 10*3/uL — ABNORMAL HIGH (ref 0.00–0.04)
Hematocrit: 28 % — ABNORMAL LOW (ref 35.0–47.0)
Hemoglobin: 8.8 g/dL — ABNORMAL LOW (ref 11.5–16.0)
Immature Granulocytes: 1 % — ABNORMAL HIGH (ref 0.0–0.5)
Lymphocytes %: 19 % (ref 12–49)
Lymphocytes Absolute: 1.4 10*3/uL (ref 0.8–3.5)
MCH: 28.8 PG (ref 26.0–34.0)
MCHC: 31.4 g/dL (ref 30.0–36.5)
MCV: 91.5 FL (ref 80.0–99.0)
MPV: 10.9 FL (ref 8.9–12.9)
Monocytes %: 9 % (ref 5–13)
Monocytes Absolute: 0.6 10*3/uL (ref 0.0–1.0)
NRBC Absolute: 0 10*3/uL (ref 0.00–0.01)
Neutrophils %: 69 % (ref 32–75)
Neutrophils Absolute: 5.1 10*3/uL (ref 1.8–8.0)
Nucleated RBCs: 0 PER 100 WBC
Platelets: 135 10*3/uL — ABNORMAL LOW (ref 150–400)
RBC: 3.06 M/uL — ABNORMAL LOW (ref 3.80–5.20)
RDW: 14.3 % (ref 11.5–14.5)
WBC: 7.4 10*3/uL (ref 3.6–11.0)

## 2021-04-20 LAB — BASIC METABOLIC PANEL
Anion Gap: 1 mmol/L — ABNORMAL LOW (ref 5–15)
BUN: 25 MG/DL — ABNORMAL HIGH (ref 6–20)
Bun/Cre Ratio: 27 — ABNORMAL HIGH (ref 12–20)
CO2: 34 mmol/L — ABNORMAL HIGH (ref 21–32)
Calcium: 8.7 MG/DL (ref 8.5–10.1)
Chloride: 100 mmol/L (ref 97–108)
Creatinine: 0.94 MG/DL (ref 0.55–1.02)
ESTIMATED GLOMERULAR FILTRATION RATE: >60 mL/min/{1.73_m2} (ref >60)
Glucose: 154 mg/dL — ABNORMAL HIGH (ref 65–100)
Potassium: 4.8 mmol/L (ref 3.5–5.1)
Sodium: 135 mmol/L — ABNORMAL LOW (ref 136–145)

## 2021-04-20 LAB — POCT GLUCOSE
POC Glucose: 163 mg/dL — ABNORMAL HIGH (ref 65–117)
POC Glucose: 149 mg/dL — ABNORMAL HIGH (ref 65–117)
POC Glucose: 226 mg/dL — ABNORMAL HIGH (ref 65–117)
POC Glucose: 173 mg/dL — ABNORMAL HIGH (ref 65–117)

## 2021-04-20 MED ORDER — GUAIFENESIN 600 MG TABLET,EXTENDED RELEASE BIPHASIC 12 HR
600 mg | Freq: Two times a day (BID) | ORAL | Status: DC
Start: 2021-04-20 — End: 2021-04-24
  Administered 2021-04-20 – 2021-04-24 (×9): via ORAL

## 2021-04-20 MED ORDER — GUAIFENESIN 600 MG TABLET,EXTENDED RELEASE BIPHASIC 12 HR
600 mg | Freq: Once | ORAL | Status: AC
Start: 2021-04-20 — End: 2021-04-19
  Administered 2021-04-20: 03:00:00 via ORAL

## 2021-04-20 MED ORDER — SODIUM CHLORIDE 0.9% BOLUS IV
0.9 % | Freq: Once | INTRAVENOUS | Status: AC
Start: 2021-04-20 — End: 2021-04-20
  Administered 2021-04-20: 17:00:00 via INTRAVENOUS

## 2021-04-20 MED ORDER — CEFTRIAXONE 2 GRAM SOLUTION FOR INJECTION
2 gram | INTRAMUSCULAR | Status: DC
Start: 2021-04-20 — End: 2021-04-24
  Administered 2021-04-20 – 2021-04-24 (×5): via INTRAVENOUS

## 2021-04-20 MED FILL — ATORVASTATIN 10 MG TAB: 10 mg | ORAL | Qty: 1

## 2021-04-20 MED FILL — ASPIRIN 81 MG CHEWABLE TAB: 81 mg | ORAL | Qty: 1

## 2021-04-20 MED FILL — INSULIN LISPRO 100 UNIT/ML INJECTION: 100 unit/mL | SUBCUTANEOUS | Qty: 3

## 2021-04-20 MED FILL — OXYCODONE 5 MG TAB: 5 mg | ORAL | Qty: 1

## 2021-04-20 MED FILL — MUCUS RELIEF ER 600 MG TABLET, EXTENDED RELEASE: 600 mg | ORAL | Qty: 1

## 2021-04-20 MED FILL — CEFTRIAXONE 2 GRAM SOLUTION FOR INJECTION: 2 gram | INTRAMUSCULAR | Qty: 2

## 2021-04-20 MED FILL — LEVOTHYROXINE 75 MCG TAB: 75 mcg | ORAL | Qty: 2

## 2021-04-20 MED FILL — CARAFATE 1 GRAM TABLET: 1 gram | ORAL | Qty: 1

## 2021-04-20 MED FILL — CEFEPIME 2 GRAM SOLUTION FOR INJECTION: 2 gram | INTRAMUSCULAR | Qty: 2

## 2021-04-20 MED FILL — BUDESONIDE 0.5 MG/2 ML NEB SUSPENSION: 0.5 mg/2 mL | RESPIRATORY_TRACT | Qty: 1

## 2021-04-20 MED FILL — HEPARIN (PORCINE) 5,000 UNIT/ML IJ SOLN: 5000 unit/mL | INTRAMUSCULAR | Qty: 1

## 2021-04-20 MED FILL — CITALOPRAM 20 MG TAB: 20 mg | ORAL | Qty: 2

## 2021-04-20 MED FILL — ACETAMINOPHEN 325 MG TABLET: 325 mg | ORAL | Qty: 2

## 2021-04-20 MED FILL — LIDOCAINE 4 % TOPICAL PATCH (12 HOUR DURATION): 4 % | CUTANEOUS | Qty: 2

## 2021-04-20 MED FILL — MONTELUKAST 10 MG TAB: 10 mg | ORAL | Qty: 1

## 2021-04-20 MED FILL — TRAZODONE 50 MG TAB: 50 mg | ORAL | Qty: 2

## 2021-04-20 MED FILL — SODIUM CHLORIDE 0.9 % IV: INTRAVENOUS | Qty: 500

## 2021-04-20 MED FILL — METRONIDAZOLE 250 MG TAB: 250 mg | ORAL | Qty: 2

## 2021-04-20 MED FILL — METOPROLOL SUCCINATE SR 25 MG 24 HR TAB: 25 mg | ORAL | Qty: 1

## 2021-04-20 MED FILL — ARFORMOTEROL 15 MCG/2 ML NEB SOLUTION: 15 mcg/2 mL | RESPIRATORY_TRACT | Qty: 2

## 2021-04-20 NOTE — Progress Notes (Signed)
 Problem: Self Care Deficits Care Plan (Adult)  Goal: *Acute Goals and Plan of Care (Insert Text)  Description: FUNCTIONAL STATUS PRIOR TO ADMISSION: Patient was modified independent using a single point cane for functional mobility. Patient was modified independent for basic and instrumental ADLs. No baseline O2 use.     HOME SUPPORT: The patient lived with her son and his family. Pt reports being home alone during the day.    Occupational Therapy Goals  Initiated 04/12/2021; Goals reviewed and continued at weekly reassessment, 04/20/21;  1.  Patient will perform grooming, standing at sink, with modified independence within 7 day(s).  2.  Patient will perform lower body dressing with supervision/set-up using AD PRN within 7 day(s).  3.  Patient will perform bathing with supervision/set-up within 7 day(s).  4.  Patient will perform toilet transfers with modified independence within 7 day(s).  5.  Patient will perform all aspects of toileting with modified independence within 7 day(s).  6.  Patient will participate in upper extremity therapeutic exercise/activities with independence for 10 minutes within 7 day(s).    7.  Patient will utilize energy conservation techniques during functional activities with verbal cues within 7 day(s).   Outcome: Progressing Towards Goal   OCCUPATIONAL THERAPY TREATMENT/WEEKLY RE-EVALUATION  Patient: Deborah [name] (66 y.o. female)  Date: 04/20/2021  Diagnosis: Systemic inflammatory response syndrome (SIRS) (HCC) [R65.10] <principal problem not specified>      Precautions:  fall  Chart, occupational therapy assessment, plan of care, and goals were reviewed.    ASSESSMENT  Ms. Harnack was seen today for weekly re-assessment.  Patient has made good progress since initial evaluation including increased activity tolerance, decreased assistance needed overall, and carryover of education provided from previous txs.  Today, patient was received in bed agreeable to activity.  She performed bed  mobility, transfer into the bathroom for ADL retraining, short distance ambulation in the room, and transfer to the recliner with x1 person assist and cues for improved transfer technique.  Patient engaged in ADLs with good tolerance; Increased assistance needed for LB and standing ADLs.  Patient was received and remained on 2L O2 to maintain SpO2 >90% with activity.  Patient would benefit from continued skilled OT to progress towards goals and improve overall independence.      Current Level of Function Impacting Discharge (ADLs): Functional mobility, supervision- CGA; UB ADLs, independent-setup; LB ADLs, CGA- min A         PLAN :  Goals have been updated based on progression since last assessment. Patient continues to benefit from skilled intervention to address the above impairments. Continue to follow patient 3 times a week to address goals.    Recommend with staff:   Recommend patient be OOB to chair as frequently as tolerated; Goal of 3x/day for all meals for 60 minutes at a time.   For toileting needs, recommend transfers to/from [facility].    Encourage patient involvement in personal care as able.    Encourage exercises frequently throughout the day.       Recommend next OT session: Continue towards set OT goals.    Recommendation for discharge: (in order for the patient to meet his/her long term goals)  Therapy up to 5 days/week in SNF setting    This discharge recommendation:  Has been made in collaboration with the attending provider and/or case management    Equipment recommendations for successful discharge (if) home: none       SUBJECTIVE:   Patient agreeable to OT re-assessment.  OBJECTIVE DATA SUMMARY:   Cognitive/Behavioral Status:  Neurologic State: Alert  Orientation Level: Oriented X4  Cognition: Follows commands  Perception: Appears intact  Perseveration: No perseveration noted  Safety/Judgement: Awareness of environment    Functional Mobility and Transfers for ADLs:  Bed Mobility:  Rolling:  Supervision  Supine to Sit: Supervision  Scooting: Contact guard assistance    Transfers:  Sit to Stand: Contact guard assistance  Functional Transfers  Toilet Transfer : Contact guard assistance  Bed to Chair: Contact guard assistance    Balance:  Sitting: Intact  Standing: Impaired  Standing - Static: Good  Standing - Dynamic : Fair    ADL Intervention:  Grooming  Grooming Assistance: Set-up  Position Performed: Seated in chair  Washing Face: Set-up  Washing Hands: Independent    Upper Body Bathing  Bathing Assistance: Set-up    Lower Body Bathing  Bathing Assistance: Minimum assistance    Upper Body Dressing Assistance  Dressing Assistance: Set-up    Lower Body Dressing Assistance  Dressing Assistance: Minimum assistance     Cognitive Retraining  Safety/Judgement: Awareness of environment    Activity Tolerance:   Good    After treatment patient left in no apparent distress:   Sitting in chair, Call bell within reach, and Bed / chair alarm activated    COMMUNICATION/COLLABORATION:   The patient's plan of care was discussed with: Physical Therapist, Registered Nurse, and patient    Ronal Done, OTR/L  Time Calculation: 27 mins

## 2021-04-20 NOTE — Progress Notes (Signed)
Medicare pt has received, reviewed, and signed 2nd IM letter informing them of their right to appeal the discharge.  Signed copy has been placed on pt bedside chart.  Meredith Tyree CMS

## 2021-04-20 NOTE — Progress Notes (Signed)
 1140: MD notified that Pt's BP 79/50 via perfect serve, however no new orders received.     Bedside shift change report given to [place] RN (oncoming nurse) by Lavanda PEAK (offgoing nurse). Report included the following information SBAR, Intake/Output, MAR, Recent Results, and Cardiac Rhythm AV-paced .

## 2021-04-20 NOTE — Progress Notes (Signed)
Problem: Falls - Risk of  Goal: *Absence of Falls  Description: Document Deborah [name] Fall Risk and appropriate interventions in the flowsheet.  Outcome: Progressing Towards Goal  Note: Fall Risk Interventions:  Mobility Interventions: Assess mobility with egress test, Patient to call before getting OOB              Elimination Interventions: Bed/chair exit alarm, Call light in reach, Patient to call for help with toileting needs              Problem: Patient Education: Go to Patient Education Activity  Goal: Patient/Family Education  Outcome: Progressing Towards Goal     Problem: Falls - Risk of  Goal: *Absence of Falls  Description: Document Deborah [name] Fall Risk and appropriate interventions in the flowsheet.  Outcome: Progressing Towards Goal  Note: Fall Risk Interventions:  Mobility Interventions: Assess mobility with egress test, Patient to call before getting OOB              Elimination Interventions: Bed/chair exit alarm, Call light in reach, Patient to call for help with toileting needs              Problem: Patient Education: Go to Patient Education Activity  Goal: Patient/Family Education  Outcome: Progressing Towards Goal     Problem: Pressure Injury - Risk of  Goal: *Prevention of pressure injury  Description: Document Braden Scale and appropriate interventions in the flowsheet.  Outcome: Progressing Towards Goal  Note: Pressure Injury Interventions:  Sensory Interventions: Assess changes in LOC    Moisture Interventions: Absorbent underpads, Minimize layers    Activity Interventions: Pressure redistribution bed/mattress(bed type)    Mobility Interventions: Turn and reposition approx. every two hours(pillow and wedges)    Nutrition Interventions: Document food/fluid/supplement intake    Friction and Shear Interventions: Minimize layers                Problem: Patient Education: Go to Patient Education Activity  Goal: Patient/Family Education  Outcome: Progressing Towards Goal     Problem: Patient Education: Go  to Patient Education Activity  Goal: Patient/Family Education  Outcome: Progressing Towards Goal     Problem: Sepsis: Day of Diagnosis  Goal: Off Pathway (Use only if patient is Off Pathway)  Outcome: Progressing Towards Goal  Goal: *Fluid resuscitation  Outcome: Progressing Towards Goal  Goal: *Paired blood cultures prior to first dose of antibiotic  Outcome: Progressing Towards Goal  Goal: *First dose of  appropriate antibiotic within 3 hours of arrival to ED, within 1 hour of arrival to ICU  Outcome: Progressing Towards Goal  Goal: *Lactic acid with first set of blood cultures  Outcome: Progressing Towards Goal  Goal: *Pneumococcal immunization (if eligible)  Outcome: Progressing Towards Goal  Goal: *Influenza immunization (if eligible)  Outcome: Progressing Towards Goal  Goal: Activity/Safety  Outcome: Progressing Towards Goal  Goal: Consults, if ordered  Outcome: Progressing Towards Goal  Goal: Diagnostic Test/Procedures  Outcome: Progressing Towards Goal  Goal: Nutrition/Diet  Outcome: Progressing Towards Goal  Goal: Discharge Planning  Outcome: Progressing Towards Goal  Goal: Medications  Outcome: Progressing Towards Goal  Goal: Respiratory  Outcome: Progressing Towards Goal  Goal: Treatments/Interventions/Procedures  Outcome: Progressing Towards Goal  Goal: Psychosocial  Outcome: Progressing Towards Goal     Problem: Sepsis: Day 2  Goal: Off Pathway (Use only if patient is Off Pathway)  Outcome: Progressing Towards Goal  Goal: *Central Venous Pressure maintained at 8-12 mm Hg  Outcome: Progressing Towards Goal  Goal: *Hemodynamically stable  Outcome: Progressing Towards  Goal  Goal: *Tolerating diet  Outcome: Progressing Towards Goal  Goal: Activity/Safety  Outcome: Progressing Towards Goal  Goal: Consults, if ordered  Outcome: Progressing Towards Goal  Goal: Diagnostic Test/Procedures  Outcome: Progressing Towards Goal  Goal: Nutrition/Diet  Outcome: Progressing Towards Goal  Goal: Discharge  Planning  Outcome: Progressing Towards Goal  Goal: Medications  Outcome: Progressing Towards Goal  Goal: Respiratory  Outcome: Progressing Towards Goal  Goal: Treatments/Interventions/Procedures  Outcome: Progressing Towards Goal  Goal: Psychosocial  Outcome: Progressing Towards Goal     Problem: Sepsis: Day 3  Goal: Off Pathway (Use only if patient is Off Pathway)  Outcome: Progressing Towards Goal  Goal: *Central Venous Pressure maintained at 8-12 mm Hg  Outcome: Progressing Towards Goal  Goal: *Oxygen saturation within defined limits  Outcome: Progressing Towards Goal  Goal: *Vital sign stability  Outcome: Progressing Towards Goal  Goal: *Tolerating diet  Outcome: Progressing Towards Goal  Goal: *Demonstrates progressive activity  Outcome: Progressing Towards Goal  Goal: Activity/Safety  Outcome: Progressing Towards Goal  Goal: Consults, if ordered  Outcome: Progressing Towards Goal  Goal: Diagnostic Test/Procedures  Outcome: Progressing Towards Goal  Goal: Nutrition/Diet  Outcome: Progressing Towards Goal  Goal: Discharge Planning  Outcome: Progressing Towards Goal  Goal: Medications  Outcome: Progressing Towards Goal  Goal: Respiratory  Outcome: Progressing Towards Goal  Goal: Treatments/Interventions/Procedures  Outcome: Progressing Towards Goal  Goal: Psychosocial  Outcome: Progressing Towards Goal     Problem: Sepsis: Day 4  Goal: Off Pathway (Use only if patient is Off Pathway)  Outcome: Progressing Towards Goal  Goal: Activity/Safety  Outcome: Progressing Towards Goal  Goal: Consults, if ordered  Outcome: Progressing Towards Goal  Goal: Diagnostic Test/Procedures  Outcome: Progressing Towards Goal  Goal: Nutrition/Diet  Outcome: Progressing Towards Goal  Goal: Discharge Planning  Outcome: Progressing Towards Goal  Goal: Medications  Outcome: Progressing Towards Goal  Goal: Respiratory  Outcome: Progressing Towards Goal  Goal: Treatments/Interventions/Procedures  Outcome: Progressing Towards Goal  Goal:  Psychosocial  Outcome: Progressing Towards Goal  Goal: *Oxygen saturation within defined limits  Outcome: Progressing Towards Goal  Goal: *Hemodynamically stable  Outcome: Progressing Towards Goal  Goal: *Vital signs within defined limits  Outcome: Progressing Towards Goal  Goal: *Tolerating diet  Outcome: Progressing Towards Goal  Goal: *Demonstrates progressive activity  Outcome: Progressing Towards Goal  Goal: *Fluid volume maintenance  Outcome: Progressing Towards Goal

## 2021-04-20 NOTE — Progress Notes (Signed)
 CM follow up:    Spoke with Torry at [facility] center.  Pending insurance authorization for SNF.  Requesting updated clinicals, notes from past 24 hours attached to [facility] referral.    [place]end[place] Girt, RN, MSN/Care manager  [phone]

## 2021-04-20 NOTE — Progress Notes (Signed)
 CM follow up:    Met with patient at bedside. Await IV therapy orders from Dr. Levern. Patient will need PICC placed prior to DC.   Current clinicals uploaded into [facility] this am to [facility].  Insurance authorization pending.  Met with patient at bedside, face mask on, discussed current plan and authorization pending. She verbalized understanding.    [place] Girt, RN, MSN/Care manager  [phone]

## 2021-04-20 NOTE — Progress Notes (Signed)
 Attempted to work with the patient for Physical Therapy, chart reviewed and discussed with nurse cleared for therapy attempted twice and both times patient declined explained benefits of therapy verbalized understanding however still not feeling well declined second attempt. We will continue to follow up with the patient for therapy.

## 2021-04-21 LAB — GLUCOSE, POC
Glucose (POC): 193 mg/dL — ABNORMAL HIGH (ref 65–117)
Glucose (POC): 206 mg/dL — ABNORMAL HIGH (ref 65–117)
Glucose (POC): 248 mg/dL — ABNORMAL HIGH (ref 65–117)
Glucose (POC): 201 mg/dL — ABNORMAL HIGH (ref 65–117)

## 2021-04-21 LAB — METABOLIC PANEL, BASIC
Anion gap: 1 mmol/L — ABNORMAL LOW (ref 5–15)
BUN/Creatinine ratio: 24 — ABNORMAL HIGH (ref 12–20)
BUN: 23 MG/DL — ABNORMAL HIGH (ref 6–20)
CO2: 33 mmol/L — ABNORMAL HIGH (ref 21–32)
Calcium: 8.6 MG/DL (ref 8.5–10.1)
Chloride: 101 mmol/L (ref 97–108)
Creatinine: 0.94 MG/DL (ref 0.55–1.02)
Glucose: 158 mg/dL — ABNORMAL HIGH (ref 65–100)
Potassium: 4.7 mmol/L (ref 3.5–5.1)
Sodium: 135 mmol/L — ABNORMAL LOW (ref 136–145)
eGFR: >60 mL/min/{1.73_m2} (ref >60)

## 2021-04-21 LAB — CBC WITH AUTOMATED DIFF
ABS. BASOPHILS: 0 10*3/uL (ref 0.0–0.1)
ABS. EOSINOPHILS: 0.2 10*3/uL (ref 0.0–0.4)
ABS. IMM. GRANS.: 0.1 10*3/uL — ABNORMAL HIGH (ref 0.00–0.04)
ABS. LYMPHOCYTES: 1.6 10*3/uL (ref 0.8–3.5)
ABS. MONOCYTES: 0.7 10*3/uL (ref 0.0–1.0)
ABS. NEUTROPHILS: 5.7 10*3/uL (ref 1.8–8.0)
ABSOLUTE NRBC: 0 10*3/uL (ref 0.00–0.01)
BASOPHILS: 0 % (ref 0–1)
EOSINOPHILS: 2 % (ref 0–7)
HCT: 28.7 % — ABNORMAL LOW (ref 35.0–47.0)
HGB: 9 g/dL — ABNORMAL LOW (ref 11.5–16.0)
IMMATURE GRANULOCYTES: 1 % — ABNORMAL HIGH (ref 0.0–0.5)
LYMPHOCYTES: 20 % (ref 12–49)
MCH: 29.1 PG (ref 26.0–34.0)
MCHC: 31.4 g/dL (ref 30.0–36.5)
MCV: 92.9 FL (ref 80.0–99.0)
MONOCYTES: 8 % (ref 5–13)
MPV: 11 FL (ref 8.9–12.9)
NEUTROPHILS: 69 % (ref 32–75)
NRBC: 0 PER 100 WBC
PLATELET: 150 10*3/uL (ref 150–400)
RBC: 3.09 M/uL — ABNORMAL LOW (ref 3.80–5.20)
RDW: 14.5 % (ref 11.5–14.5)
WBC: 8.3 10*3/uL (ref 3.6–11.0)

## 2021-04-21 LAB — MAGNESIUM
Magnesium: 1.9 mg/dL (ref 1.6–2.4)
Magnesium: 1.9 mg/dL (ref 1.6–2.4)

## 2021-04-21 LAB — BASIC METABOLIC PANEL
Anion Gap: 1 mmol/L — ABNORMAL LOW (ref 5–15)
BUN: 23 MG/DL — ABNORMAL HIGH (ref 6–20)
Bun/Cre Ratio: 24 — ABNORMAL HIGH (ref 12–20)
CO2: 33 mmol/L — ABNORMAL HIGH (ref 21–32)
Calcium: 8.6 MG/DL (ref 8.5–10.1)
Chloride: 101 mmol/L (ref 97–108)
Creatinine: 0.94 MG/DL (ref 0.55–1.02)
ESTIMATED GLOMERULAR FILTRATION RATE: >60 mL/min/{1.73_m2} (ref >60)
Glucose: 158 mg/dL — ABNORMAL HIGH (ref 65–100)
Potassium: 4.7 mmol/L (ref 3.5–5.1)
Sodium: 135 mmol/L — ABNORMAL LOW (ref 136–145)

## 2021-04-21 LAB — POCT GLUCOSE
POC Glucose: 193 mg/dL — ABNORMAL HIGH (ref 65–117)
POC Glucose: 206 mg/dL — ABNORMAL HIGH (ref 65–117)
POC Glucose: 248 mg/dL — ABNORMAL HIGH (ref 65–117)
POC Glucose: 201 mg/dL — ABNORMAL HIGH (ref 65–117)

## 2021-04-21 LAB — CBC WITH AUTO DIFFERENTIAL
Basophils %: 0 % (ref 0–1)
Basophils Absolute: 0 10*3/uL (ref 0.0–0.1)
Eosinophils %: 2 % (ref 0–7)
Eosinophils Absolute: 0.2 10*3/uL (ref 0.0–0.4)
Granulocyte Absolute Count: 0.1 10*3/uL — ABNORMAL HIGH (ref 0.00–0.04)
Hematocrit: 28.7 % — ABNORMAL LOW (ref 35.0–47.0)
Hemoglobin: 9 g/dL — ABNORMAL LOW (ref 11.5–16.0)
Immature Granulocytes: 1 % — ABNORMAL HIGH (ref 0.0–0.5)
Lymphocytes %: 20 % (ref 12–49)
Lymphocytes Absolute: 1.6 10*3/uL (ref 0.8–3.5)
MCH: 29.1 PG (ref 26.0–34.0)
MCHC: 31.4 g/dL (ref 30.0–36.5)
MCV: 92.9 FL (ref 80.0–99.0)
MPV: 11 FL (ref 8.9–12.9)
Monocytes %: 8 % (ref 5–13)
Monocytes Absolute: 0.7 10*3/uL (ref 0.0–1.0)
NRBC Absolute: 0 10*3/uL (ref 0.00–0.01)
Neutrophils %: 69 % (ref 32–75)
Neutrophils Absolute: 5.7 10*3/uL (ref 1.8–8.0)
Nucleated RBCs: 0 PER 100 WBC
Platelets: 150 10*3/uL (ref 150–400)
RBC: 3.09 M/uL — ABNORMAL LOW (ref 3.80–5.20)
RDW: 14.5 % (ref 11.5–14.5)
WBC: 8.3 10*3/uL (ref 3.6–11.0)

## 2021-04-21 MED ORDER — LIDOCAINE 4 % TOPICAL PATCH (12 HOUR DURATION)
4 % | Freq: Once | CUTANEOUS | Status: AC
Start: 2021-04-21 — End: 2021-04-22

## 2021-04-21 MED FILL — LEVOTHYROXINE 75 MCG TAB: 75 mcg | ORAL | Qty: 2

## 2021-04-21 MED FILL — CARAFATE 1 GRAM TABLET: 1 gram | ORAL | Qty: 1

## 2021-04-21 MED FILL — INSULIN LISPRO 100 UNIT/ML INJECTION: 100 unit/mL | SUBCUTANEOUS | Qty: 3

## 2021-04-21 MED FILL — MUCUS RELIEF ER 600 MG TABLET, EXTENDED RELEASE: 600 mg | ORAL | Qty: 1

## 2021-04-21 MED FILL — METRONIDAZOLE 250 MG TAB: 250 mg | ORAL | Qty: 2

## 2021-04-21 MED FILL — CITALOPRAM 20 MG TAB: 20 mg | ORAL | Qty: 2

## 2021-04-21 MED FILL — HEPARIN (PORCINE) 5,000 UNIT/ML IJ SOLN: 5000 unit/mL | INTRAMUSCULAR | Qty: 1

## 2021-04-21 MED FILL — BUDESONIDE 0.5 MG/2 ML NEB SUSPENSION: 0.5 mg/2 mL | RESPIRATORY_TRACT | Qty: 1

## 2021-04-21 MED FILL — OXYCODONE 5 MG TAB: 5 mg | ORAL | Qty: 1

## 2021-04-21 MED FILL — ARFORMOTEROL 15 MCG/2 ML NEB SOLUTION: 15 mcg/2 mL | RESPIRATORY_TRACT | Qty: 2

## 2021-04-21 MED FILL — LIDOCAINE 4 % TOPICAL PATCH (12 HOUR DURATION): 4 % | CUTANEOUS | Qty: 1

## 2021-04-21 MED FILL — CEFTRIAXONE 2 GRAM SOLUTION FOR INJECTION: 2 gram | INTRAMUSCULAR | Qty: 2

## 2021-04-21 MED FILL — TRAZODONE 50 MG TAB: 50 mg | ORAL | Qty: 2

## 2021-04-21 MED FILL — MONTELUKAST 10 MG TAB: 10 mg | ORAL | Qty: 1

## 2021-04-21 MED FILL — ATORVASTATIN 10 MG TAB: 10 mg | ORAL | Qty: 1

## 2021-04-21 MED FILL — ASPIRIN 81 MG CHEWABLE TAB: 81 mg | ORAL | Qty: 1

## 2021-04-21 MED FILL — METOPROLOL SUCCINATE SR 25 MG 24 HR TAB: 25 mg | ORAL | Qty: 1

## 2021-04-21 MED FILL — INSULIN LISPRO 100 UNIT/ML INJECTION: 100 unit/mL | SUBCUTANEOUS | Qty: 2

## 2021-04-21 NOTE — Progress Notes (Signed)
 1535: RN received call from RRT RN that Pt's HR was in 40s. RN off floor when receiving call and had not been notified that HR was so low. RN back at bedside to assess and HR on monitor in 80s. MD notified.     1719: MD notified that Pt's legs increasing in size and R leg is slightly larger than left. Pt is also stating that her legs have grown in size.     1900: Bedside shift change report given to [place] RN (Cabin crew) by Lavanda PEAK (offgoing nurse). Report included the following information SBAR, Intake/Output, MAR, Recent Results, and Cardiac Rhythm A-paced .

## 2021-04-21 NOTE — Progress Notes (Signed)
 04/21/21  9:48 AM    CM received message from [facility] manager, patients insurance has denied for admission at [facility]. CM met with patient to discuss outcome and she requested to appeal the decision. CM and patient called patients insurance at [phone] to start the appeal process. CM faxed updated clinicals to [phone]- was informed there will be a 72 hour period for a decision. Call number [phone]. Kristie Locke

## 2021-04-22 ENCOUNTER — Inpatient Hospital Stay: Admit: 2021-04-22 | Payer: MEDICARE | Primary: Family

## 2021-04-22 LAB — CBC WITH AUTOMATED DIFF
ABS. BASOPHILS: 0 10*3/uL (ref 0.0–0.1)
ABS. EOSINOPHILS: 0.2 10*3/uL (ref 0.0–0.4)
ABS. IMM. GRANS.: 0 10*3/uL (ref 0.00–0.04)
ABS. LYMPHOCYTES: 1.8 10*3/uL (ref 0.8–3.5)
ABS. MONOCYTES: 0.6 10*3/uL (ref 0.0–1.0)
ABS. NEUTROPHILS: 4.5 10*3/uL (ref 1.8–8.0)
ABSOLUTE NRBC: 0 10*3/uL (ref 0.00–0.01)
BASOPHILS: 1 % (ref 0–1)
EOSINOPHILS: 3 % (ref 0–7)
HCT: 29.4 % — ABNORMAL LOW (ref 35.0–47.0)
HGB: 9.1 g/dL — ABNORMAL LOW (ref 11.5–16.0)
IMMATURE GRANULOCYTES: 1 % — ABNORMAL HIGH (ref 0.0–0.5)
LYMPHOCYTES: 25 % (ref 12–49)
MCH: 29.2 PG (ref 26.0–34.0)
MCHC: 31 g/dL (ref 30.0–36.5)
MCV: 94.2 FL (ref 80.0–99.0)
MONOCYTES: 9 % (ref 5–13)
MPV: 10.9 FL (ref 8.9–12.9)
NEUTROPHILS: 61 % (ref 32–75)
NRBC: 0 PER 100 WBC
PLATELET: 146 10*3/uL — ABNORMAL LOW (ref 150–400)
RBC: 3.12 M/uL — ABNORMAL LOW (ref 3.80–5.20)
RDW: 14.4 % (ref 11.5–14.5)
WBC: 7.2 10*3/uL (ref 3.6–11.0)

## 2021-04-22 LAB — METABOLIC PANEL, BASIC
Anion gap: 1 mmol/L — ABNORMAL LOW (ref 5–15)
BUN/Creatinine ratio: 20 (ref 12–20)
BUN: 18 MG/DL (ref 6–20)
CO2: 34 mmol/L — ABNORMAL HIGH (ref 21–32)
Calcium: 8.8 MG/DL (ref 8.5–10.1)
Chloride: 101 mmol/L (ref 97–108)
Creatinine: 0.92 MG/DL (ref 0.55–1.02)
Glucose: 143 mg/dL — ABNORMAL HIGH (ref 65–100)
Potassium: 4.7 mmol/L (ref 3.5–5.1)
Sodium: 136 mmol/L (ref 136–145)
eGFR: >60 mL/min/{1.73_m2} (ref >60)

## 2021-04-22 LAB — GLUCOSE, POC
Glucose (POC): 137 mg/dL — ABNORMAL HIGH (ref 65–117)
Glucose (POC): 240 mg/dL — ABNORMAL HIGH (ref 65–117)
Glucose (POC): 108 mg/dL (ref 65–117)

## 2021-04-22 LAB — MAGNESIUM
Magnesium: 1.9 mg/dL (ref 1.6–2.4)
Magnesium: 1.9 mg/dL (ref 1.6–2.4)

## 2021-04-22 LAB — CBC WITH AUTO DIFFERENTIAL
Basophils %: 1 % (ref 0–1)
Basophils Absolute: 0 10*3/uL (ref 0.0–0.1)
Eosinophils %: 3 % (ref 0–7)
Eosinophils Absolute: 0.2 10*3/uL (ref 0.0–0.4)
Granulocyte Absolute Count: 0 10*3/uL (ref 0.00–0.04)
Hematocrit: 29.4 % — ABNORMAL LOW (ref 35.0–47.0)
Hemoglobin: 9.1 g/dL — ABNORMAL LOW (ref 11.5–16.0)
Immature Granulocytes: 1 % — ABNORMAL HIGH (ref 0.0–0.5)
Lymphocytes %: 25 % (ref 12–49)
Lymphocytes Absolute: 1.8 10*3/uL (ref 0.8–3.5)
MCH: 29.2 PG (ref 26.0–34.0)
MCHC: 31 g/dL (ref 30.0–36.5)
MCV: 94.2 FL (ref 80.0–99.0)
MPV: 10.9 FL (ref 8.9–12.9)
Monocytes %: 9 % (ref 5–13)
Monocytes Absolute: 0.6 10*3/uL (ref 0.0–1.0)
NRBC Absolute: 0 10*3/uL (ref 0.00–0.01)
Neutrophils %: 61 % (ref 32–75)
Neutrophils Absolute: 4.5 10*3/uL (ref 1.8–8.0)
Nucleated RBCs: 0 PER 100 WBC
Platelets: 146 10*3/uL — ABNORMAL LOW (ref 150–400)
RBC: 3.12 M/uL — ABNORMAL LOW (ref 3.80–5.20)
RDW: 14.4 % (ref 11.5–14.5)
WBC: 7.2 10*3/uL (ref 3.6–11.0)

## 2021-04-22 LAB — POCT GLUCOSE
POC Glucose: 137 mg/dL — ABNORMAL HIGH (ref 65–117)
POC Glucose: 240 mg/dL — ABNORMAL HIGH (ref 65–117)
POC Glucose: 108 mg/dL (ref 65–117)

## 2021-04-22 LAB — BASIC METABOLIC PANEL
Anion Gap: 1 mmol/L — ABNORMAL LOW (ref 5–15)
BUN: 18 MG/DL (ref 6–20)
Bun/Cre Ratio: 20 (ref 12–20)
CO2: 34 mmol/L — ABNORMAL HIGH (ref 21–32)
Calcium: 8.8 MG/DL (ref 8.5–10.1)
Chloride: 101 mmol/L (ref 97–108)
Creatinine: 0.92 MG/DL (ref 0.55–1.02)
ESTIMATED GLOMERULAR FILTRATION RATE: >60 mL/min/{1.73_m2} (ref >60)
Glucose: 143 mg/dL — ABNORMAL HIGH (ref 65–100)
Potassium: 4.7 mmol/L (ref 3.5–5.1)
Sodium: 136 mmol/L (ref 136–145)

## 2021-04-22 MED FILL — OXYCODONE 5 MG TAB: 5 mg | ORAL | Qty: 1

## 2021-04-22 MED FILL — BUDESONIDE 0.5 MG/2 ML NEB SUSPENSION: 0.5 mg/2 mL | RESPIRATORY_TRACT | Qty: 1

## 2021-04-22 MED FILL — CEFTRIAXONE 2 GRAM SOLUTION FOR INJECTION: 2 gram | INTRAMUSCULAR | Qty: 2

## 2021-04-22 MED FILL — METRONIDAZOLE 250 MG TAB: 250 mg | ORAL | Qty: 2

## 2021-04-22 MED FILL — INSULIN LISPRO 100 UNIT/ML INJECTION: 100 unit/mL | SUBCUTANEOUS | Qty: 1

## 2021-04-22 MED FILL — CARAFATE 1 GRAM TABLET: 1 gram | ORAL | Qty: 1

## 2021-04-22 MED FILL — ARFORMOTEROL 15 MCG/2 ML NEB SOLUTION: 15 mcg/2 mL | RESPIRATORY_TRACT | Qty: 2

## 2021-04-22 MED FILL — CITALOPRAM 20 MG TAB: 20 mg | ORAL | Qty: 2

## 2021-04-22 MED FILL — MUCUS RELIEF ER 600 MG TABLET, EXTENDED RELEASE: 600 mg | ORAL | Qty: 1

## 2021-04-22 MED FILL — INSULIN LISPRO 100 UNIT/ML INJECTION: 100 unit/mL | SUBCUTANEOUS | Qty: 2

## 2021-04-22 MED FILL — TRAZODONE 50 MG TAB: 50 mg | ORAL | Qty: 2

## 2021-04-22 MED FILL — HEPARIN (PORCINE) 5,000 UNIT/ML IJ SOLN: 5000 unit/mL | INTRAMUSCULAR | Qty: 1

## 2021-04-22 MED FILL — LEVOTHYROXINE 75 MCG TAB: 75 mcg | ORAL | Qty: 2

## 2021-04-22 MED FILL — MONTELUKAST 10 MG TAB: 10 mg | ORAL | Qty: 1

## 2021-04-22 MED FILL — ATORVASTATIN 10 MG TAB: 10 mg | ORAL | Qty: 1

## 2021-04-22 MED FILL — METOPROLOL SUCCINATE SR 25 MG 24 HR TAB: 25 mg | ORAL | Qty: 1

## 2021-04-22 MED FILL — ASPIRIN 81 MG CHEWABLE TAB: 81 mg | ORAL | Qty: 1

## 2021-04-22 NOTE — Progress Notes (Signed)
Vitals to not be taken during the hours of 22:00-0600 per provider order

## 2021-04-22 NOTE — Progress Notes (Signed)
 Bedside and Verbal shift change report given to [facility] (Cabin crew) by [facility] (offgoing nurse). Report included the following information SBAR, Kardex, ED Summary, Procedure Summary, Intake/Output, MAR, and Recent Results.     Bedside and Verbal shift change report given to [place] (Cabin crew) by [place] (offgoing nurse). Report included the following information SBAR, Kardex, ED Summary, Procedure Summary, Intake/Output, MAR, Recent Results, and Cardiac Rhythm A paced .

## 2021-04-22 NOTE — Progress Notes (Signed)
 Problem: Falls - Risk of  Goal: *Absence of Falls  Description: Document Deborah [name] Fall Risk and appropriate interventions in the flowsheet.  Outcome: Progressing Towards Goal     Problem: Patient Education: Go to Patient Education Activity  Goal: Patient/Family Education  Outcome: Progressing Towards Goal     Problem: Falls - Risk of  Goal: *Absence of Falls  Description: Document Deborah [name] Fall Risk and appropriate interventions in the flowsheet.  Outcome: Progressing Towards Goal     Problem: Patient Education: Go to Patient Education Activity  Goal: Patient/Family Education  Outcome: Progressing Towards Goal     Problem: Pressure Injury - Risk of  Goal: *Prevention of pressure injury  Description: Document Braden Scale and appropriate interventions in the flowsheet.  Outcome: Progressing Towards Goal     Problem: Patient Education: Go to Patient Education Activity  Goal: Patient/Family Education  Outcome: Progressing Towards Goal     Problem: Sepsis: Day 5  Goal: Off Pathway (Use only if patient is Off Pathway)  Outcome: Progressing Towards Goal  Goal: *Oxygen saturation within defined limits  Outcome: Progressing Towards Goal  Goal: *Vital signs within defined limits  Outcome: Progressing Towards Goal  Goal: *Tolerating diet  Outcome: Progressing Towards Goal  Goal: *Demonstrates progressive activity  Outcome: Progressing Towards Goal  Goal: *Discharge plan identified  Outcome: Progressing Towards Goal  Goal: Activity/Safety  Outcome: Progressing Towards Goal  Goal: Consults, if ordered  Outcome: Progressing Towards Goal  Goal: Diagnostic Test/Procedures  Outcome: Progressing Towards Goal  Goal: Nutrition/Diet  Outcome: Progressing Towards Goal  Goal: Discharge Planning  Outcome: Progressing Towards Goal  Goal: Medications  Outcome: Progressing Towards Goal  Goal: Respiratory  Outcome: Progressing Towards Goal  Goal: Treatments/Interventions/Procedures  Outcome: Progressing Towards Goal  Goal:  Psychosocial  Outcome: Progressing Towards Goal     Problem: Sepsis: Day 6  Goal: Off Pathway (Use only if patient is Off Pathway)  Outcome: Progressing Towards Goal  Goal: *Oxygen saturation within defined limits  Outcome: Progressing Towards Goal  Goal: *Vital signs within defined limits  Outcome: Progressing Towards Goal  Goal: *Tolerating diet  Outcome: Progressing Towards Goal  Goal: *Demonstrates progressive activity  Outcome: Progressing Towards Goal  Goal: Influenza immunization  Outcome: Progressing Towards Goal  Goal: *Pneumococcal immunization  Outcome: Progressing Towards Goal  Goal: Activity/Safety  Outcome: Progressing Towards Goal  Goal: Diagnostic Test/Procedures  Outcome: Progressing Towards Goal  Goal: Nutrition/Diet  Outcome: Progressing Towards Goal  Goal: Discharge Planning  Outcome: Progressing Towards Goal  Goal: Medications  Outcome: Progressing Towards Goal  Goal: Respiratory  Outcome: Progressing Towards Goal  Goal: Treatments/Interventions/Procedures  Outcome: Progressing Towards Goal  Goal: Psychosocial  Outcome: Progressing Towards Goal     Problem: Sepsis: Discharge Outcomes  Goal: *Vital signs within defined limits  Outcome: Progressing Towards Goal  Goal: *Tolerating diet  Outcome: Progressing Towards Goal  Goal: *Verbalizes understanding and describes prescribed diet  Outcome: Progressing Towards Goal  Goal: *Demonstrates progressive activity  Outcome: Progressing Towards Goal  Goal: *Describes follow-up/return visits to physicians  Outcome: Progressing Towards Goal  Goal: *Verbalizes name, dosage, time, side effects, and number of days to continue medications  Outcome: Progressing Towards Goal  Goal: *Influenza immunization (Oct-Mar only)  Outcome: Progressing Towards Goal  Goal: *Pneumococcal immunization  Outcome: Progressing Towards Goal  Goal: *Lungs clear or at baseline  Outcome: Progressing Towards Goal  Goal: *Oxygen saturation returns to baseline or 90% or better on room  air  Outcome: Progressing Towards Goal  Goal: *Glycemic control  Outcome:  Progressing Towards Goal  Goal: *Absence of deep venous thrombosis signs and symptoms(Stroke Metric)  Outcome: Progressing Towards Goal  Goal: *Describes available resources and support systems  Outcome: Progressing Towards Goal  Goal: *Optimal pain control at patient's stated goal  Outcome: Progressing Towards Goal     Problem: Diabetic Ulcer-Treatment  Goal: *Improvement of existing diabetic ulcer  Outcome: Progressing Towards Goal     Problem: Patient Education: Go to Patient Education Activity  Goal: Patient/Family Education  Outcome: Progressing Towards Goal     Problem: Patient Education: Go to Patient Education Activity  Goal: Patient/Family Education  Outcome: Progressing Towards Goal     Problem: Patient Education: Go to Patient Education Activity  Goal: Patient/Family Education  Outcome: Progressing Towards Goal     Problem: Nutrition Deficit  Goal: *Optimize nutritional status  Outcome: Progressing Towards Goal     Problem: Heart Failure: Day 2  Goal: Off Pathway (Use only if patient is Off Pathway)  Outcome: Progressing Towards Goal  Goal: Activity/Safety  Outcome: Progressing Towards Goal  Goal: Consults, if ordered  Outcome: Progressing Towards Goal  Goal: Diagnostic Test/Procedures  Outcome: Progressing Towards Goal  Goal: Nutrition/Diet  Outcome: Progressing Towards Goal  Goal: Discharge Planning  Outcome: Progressing Towards Goal  Goal: Medications  Outcome: Progressing Towards Goal  Goal: Respiratory  Outcome: Progressing Towards Goal  Goal: Treatments/Interventions/Procedures  Outcome: Progressing Towards Goal  Goal: Psychosocial  Outcome: Progressing Towards Goal  Goal: *Oxygen saturation within defined limits  Outcome: Progressing Towards Goal  Goal: *Hemodynamically stable  Outcome: Progressing Towards Goal  Goal: *Optimal pain control at patient's stated goal  Outcome: Progressing Towards Goal  Goal: *Anxiety reduced or  absent  Outcome: Progressing Towards Goal  Goal: *Demonstrates progressive activity  Outcome: Progressing Towards Goal     Problem: Heart Failure: Day 3  Goal: Off Pathway (Use only if patient is Off Pathway)  Outcome: Progressing Towards Goal  Goal: Activity/Safety  Outcome: Progressing Towards Goal  Goal: Diagnostic Test/Procedures  Outcome: Progressing Towards Goal  Goal: Nutrition/Diet  Outcome: Progressing Towards Goal  Goal: Discharge Planning  Outcome: Progressing Towards Goal  Goal: Medications  Outcome: Progressing Towards Goal  Goal: Respiratory  Outcome: Progressing Towards Goal  Goal: Treatments/Interventions/Procedures  Outcome: Progressing Towards Goal  Goal: Psychosocial  Outcome: Progressing Towards Goal  Goal: *Oxygen saturation within defined limits  Outcome: Progressing Towards Goal  Goal: *Hemodynamically stable  Outcome: Progressing Towards Goal  Goal: *Optimal pain control at patient's stated goal  Outcome: Progressing Towards Goal  Goal: *Anxiety reduced or absent  Outcome: Progressing Towards Goal  Goal: *Demonstrates progressive activity  Outcome: Progressing Towards Goal     Problem: Heart Failure: Day 4  Goal: Off Pathway (Use only if patient is Off Pathway)  Outcome: Progressing Towards Goal  Goal: Activity/Safety  Outcome: Progressing Towards Goal  Goal: Diagnostic Test/Procedures  Outcome: Progressing Towards Goal  Goal: Nutrition/Diet  Outcome: Progressing Towards Goal  Goal: Discharge Planning  Outcome: Progressing Towards Goal  Goal: Medications  Outcome: Progressing Towards Goal  Goal: Respiratory  Outcome: Progressing Towards Goal  Goal: Treatments/Interventions/Procedures  Outcome: Progressing Towards Goal  Goal: Psychosocial  Outcome: Progressing Towards Goal  Goal: *Oxygen saturation within defined limits  Outcome: Progressing Towards Goal  Goal: *Hemodynamically stable  Outcome: Progressing Towards Goal  Goal: *Optimal pain control at patient's stated goal  Outcome: Progressing  Towards Goal  Goal: *Anxiety reduced or absent  Outcome: Progressing Towards Goal  Goal: *Demonstrates progressive activity  Outcome: Progressing Towards Goal  Problem: Heart Failure: Day 5  Goal: Off Pathway (Use only if patient is Off Pathway)  Outcome: Progressing Towards Goal  Goal: Activity/Safety  Outcome: Progressing Towards Goal  Goal: Diagnostic Test/Procedures  Outcome: Progressing Towards Goal  Goal: Nutrition/Diet  Outcome: Progressing Towards Goal  Goal: Discharge Planning  Outcome: Progressing Towards Goal  Goal: Medications  Outcome: Progressing Towards Goal  Goal: Respiratory  Outcome: Progressing Towards Goal  Goal: Treatments/Interventions/Procedures  Outcome: Progressing Towards Goal  Goal: Psychosocial  Outcome: Progressing Towards Goal     Problem: Heart Failure: Discharge Outcomes  Goal: *Demonstrates ability to perform prescribed activity without shortness of breath or discomfort  Outcome: Progressing Towards Goal  Goal: *Left ventricular function assessment completed prior to or during stay, or planned for post-discharge  Outcome: Progressing Towards Goal  Goal: *ACEI prescribed if LVEF less than 40% and no contraindications or ARB prescribed  Outcome: Progressing Towards Goal  Goal: *Verbalizes understanding and describes prescribed diet  Outcome: Progressing Towards Goal  Goal: *Verbalizes understanding/describes prescribed medications  Outcome: Progressing Towards Goal  Goal: *Describes available resources and support systems  Description: (eg: Home Health, Palliative Care, [employer] Directive)  Outcome: Progressing Towards Goal  Goal: *Describes smoking cessation resources  Outcome: Progressing Towards Goal  Goal: *Understands and describes signs and symptoms to report to providers(Stroke Metric)  Outcome: Progressing Towards Goal  Goal: *Describes/verbalizes understanding of follow-up/return appt  Description: (eg: to physicians, diabetes treatment coordinator, and other  resources  Outcome: Progressing Towards Goal  Goal: *Describes importance of continuing daily weights and changes to report to physician  Outcome: Progressing Towards Goal

## 2021-04-23 LAB — METABOLIC PANEL, BASIC
Anion gap: 3 mmol/L — ABNORMAL LOW (ref 5–15)
BUN/Creatinine ratio: 17 (ref 12–20)
BUN: 19 MG/DL (ref 6–20)
CO2: 31 mmol/L (ref 21–32)
Calcium: 9.1 MG/DL (ref 8.5–10.1)
Chloride: 101 mmol/L (ref 97–108)
Creatinine: 1.13 MG/DL — ABNORMAL HIGH (ref 0.55–1.02)
Glucose: 208 mg/dL — ABNORMAL HIGH (ref 65–100)
Potassium: 4.8 mmol/L (ref 3.5–5.1)
Sodium: 135 mmol/L — ABNORMAL LOW (ref 136–145)
eGFR: 54 mL/min/{1.73_m2} — ABNORMAL LOW (ref >60)

## 2021-04-23 LAB — CBC WITH AUTOMATED DIFF
ABS. BASOPHILS: 0 10*3/uL (ref 0.0–0.1)
ABS. EOSINOPHILS: 0.2 10*3/uL (ref 0.0–0.4)
ABS. IMM. GRANS.: 0 10*3/uL (ref 0.00–0.04)
ABS. LYMPHOCYTES: 2 10*3/uL (ref 0.8–3.5)
ABS. MONOCYTES: 0.6 10*3/uL (ref 0.0–1.0)
ABS. NEUTROPHILS: 4.8 10*3/uL (ref 1.8–8.0)
ABSOLUTE NRBC: 0 10*3/uL (ref 0.00–0.01)
BASOPHILS: 1 % (ref 0–1)
EOSINOPHILS: 3 % (ref 0–7)
HCT: 31.5 % — ABNORMAL LOW (ref 35.0–47.0)
HGB: 9.4 g/dL — ABNORMAL LOW (ref 11.5–16.0)
IMMATURE GRANULOCYTES: 1 % — ABNORMAL HIGH (ref 0.0–0.5)
LYMPHOCYTES: 26 % (ref 12–49)
MCH: 28.2 PG (ref 26.0–34.0)
MCHC: 29.8 g/dL — ABNORMAL LOW (ref 30.0–36.5)
MCV: 94.6 FL (ref 80.0–99.0)
MONOCYTES: 8 % (ref 5–13)
MPV: 10.8 FL (ref 8.9–12.9)
NEUTROPHILS: 61 % (ref 32–75)
NRBC: 0 PER 100 WBC
PLATELET: 154 10*3/uL (ref 150–400)
RBC: 3.33 M/uL — ABNORMAL LOW (ref 3.80–5.20)
RDW: 14.6 % — ABNORMAL HIGH (ref 11.5–14.5)
WBC: 7.7 10*3/uL (ref 3.6–11.0)

## 2021-04-23 LAB — GLUCOSE, POC
Glucose (POC): 176 mg/dL — ABNORMAL HIGH (ref 65–117)
Glucose (POC): 131 mg/dL — ABNORMAL HIGH (ref 65–117)
Glucose (POC): 205 mg/dL — ABNORMAL HIGH (ref 65–117)
Glucose (POC): 184 mg/dL — ABNORMAL HIGH (ref 65–117)

## 2021-04-23 LAB — MAGNESIUM
Magnesium: 1.9 mg/dL (ref 1.6–2.4)
Magnesium: 1.9 mg/dL (ref 1.6–2.4)

## 2021-04-23 LAB — CBC WITH AUTO DIFFERENTIAL
Basophils %: 1 % (ref 0–1)
Basophils Absolute: 0 10*3/uL (ref 0.0–0.1)
Eosinophils %: 3 % (ref 0–7)
Eosinophils Absolute: 0.2 10*3/uL (ref 0.0–0.4)
Granulocyte Absolute Count: 0 10*3/uL (ref 0.00–0.04)
Hematocrit: 31.5 % — ABNORMAL LOW (ref 35.0–47.0)
Hemoglobin: 9.4 g/dL — ABNORMAL LOW (ref 11.5–16.0)
Immature Granulocytes: 1 % — ABNORMAL HIGH (ref 0.0–0.5)
Lymphocytes %: 26 % (ref 12–49)
Lymphocytes Absolute: 2 10*3/uL (ref 0.8–3.5)
MCH: 28.2 PG (ref 26.0–34.0)
MCHC: 29.8 g/dL — ABNORMAL LOW (ref 30.0–36.5)
MCV: 94.6 FL (ref 80.0–99.0)
MPV: 10.8 FL (ref 8.9–12.9)
Monocytes %: 8 % (ref 5–13)
Monocytes Absolute: 0.6 10*3/uL (ref 0.0–1.0)
NRBC Absolute: 0 10*3/uL (ref 0.00–0.01)
Neutrophils %: 61 % (ref 32–75)
Neutrophils Absolute: 4.8 10*3/uL (ref 1.8–8.0)
Nucleated RBCs: 0 PER 100 WBC
Platelets: 154 10*3/uL (ref 150–400)
RBC: 3.33 M/uL — ABNORMAL LOW (ref 3.80–5.20)
RDW: 14.6 % — ABNORMAL HIGH (ref 11.5–14.5)
WBC: 7.7 10*3/uL (ref 3.6–11.0)

## 2021-04-23 LAB — BASIC METABOLIC PANEL
Anion Gap: 3 mmol/L — ABNORMAL LOW (ref 5–15)
BUN: 19 MG/DL (ref 6–20)
Bun/Cre Ratio: 17 (ref 12–20)
CO2: 31 mmol/L (ref 21–32)
Calcium: 9.1 MG/DL (ref 8.5–10.1)
Chloride: 101 mmol/L (ref 97–108)
Creatinine: 1.13 MG/DL — ABNORMAL HIGH (ref 0.55–1.02)
ESTIMATED GLOMERULAR FILTRATION RATE: 54 mL/min/{1.73_m2} — ABNORMAL LOW (ref >60)
Glucose: 208 mg/dL — ABNORMAL HIGH (ref 65–100)
Potassium: 4.8 mmol/L (ref 3.5–5.1)
Sodium: 135 mmol/L — ABNORMAL LOW (ref 136–145)

## 2021-04-23 LAB — POCT GLUCOSE
POC Glucose: 176 mg/dL — ABNORMAL HIGH (ref 65–117)
POC Glucose: 131 mg/dL — ABNORMAL HIGH (ref 65–117)
POC Glucose: 205 mg/dL — ABNORMAL HIGH (ref 65–117)
POC Glucose: 184 mg/dL — ABNORMAL HIGH (ref 65–117)

## 2021-04-23 MED FILL — CEFTRIAXONE 2 GRAM SOLUTION FOR INJECTION: 2 gram | INTRAMUSCULAR | Qty: 2

## 2021-04-23 MED FILL — INSULIN LISPRO 100 UNIT/ML INJECTION: 100 unit/mL | SUBCUTANEOUS | Qty: 2

## 2021-04-23 MED FILL — HEPARIN (PORCINE) 5,000 UNIT/ML IJ SOLN: 5000 unit/mL | INTRAMUSCULAR | Qty: 1

## 2021-04-23 MED FILL — CITALOPRAM 20 MG TAB: 20 mg | ORAL | Qty: 2

## 2021-04-23 MED FILL — TRAZODONE 50 MG TAB: 50 mg | ORAL | Qty: 2

## 2021-04-23 MED FILL — HYDROMORPHONE 0.5 MG/0.5 ML SYRINGE: 0.5 mg/ mL | INTRAMUSCULAR | Qty: 0.5

## 2021-04-23 MED FILL — MUCUS RELIEF ER 600 MG TABLET, EXTENDED RELEASE: 600 mg | ORAL | Qty: 1

## 2021-04-23 MED FILL — ARFORMOTEROL 15 MCG/2 ML NEB SOLUTION: 15 mcg/2 mL | RESPIRATORY_TRACT | Qty: 2

## 2021-04-23 MED FILL — OXYCODONE 5 MG TAB: 5 mg | ORAL | Qty: 1

## 2021-04-23 MED FILL — CARAFATE 1 GRAM TABLET: 1 gram | ORAL | Qty: 1

## 2021-04-23 MED FILL — LEVOTHYROXINE 75 MCG TAB: 75 mcg | ORAL | Qty: 2

## 2021-04-23 MED FILL — METOPROLOL SUCCINATE SR 25 MG 24 HR TAB: 25 mg | ORAL | Qty: 1

## 2021-04-23 MED FILL — MONTELUKAST 10 MG TAB: 10 mg | ORAL | Qty: 1

## 2021-04-23 MED FILL — BUDESONIDE 0.5 MG/2 ML NEB SUSPENSION: 0.5 mg/2 mL | RESPIRATORY_TRACT | Qty: 1

## 2021-04-23 MED FILL — LIDOCAINE 4 % TOPICAL PATCH (12 HOUR DURATION): 4 % | CUTANEOUS | Qty: 2

## 2021-04-23 MED FILL — ASPIRIN 81 MG CHEWABLE TAB: 81 mg | ORAL | Qty: 1

## 2021-04-23 MED FILL — METRONIDAZOLE 250 MG TAB: 250 mg | ORAL | Qty: 2

## 2021-04-23 MED FILL — ATORVASTATIN 10 MG TAB: 10 mg | ORAL | Qty: 1

## 2021-04-23 NOTE — Progress Notes (Signed)
 CM follow up:    Call received from [place], liaison from [employer]. Requesting initial PT/OT evaluations and last 4 days of PT/OT notes. Requested documentation and IV antibiotics order and notes from ID included in documents faxed to [phone]. Await appeal decision.    Glend[place] Girt, RN, MSN/Care manager  [phone]

## 2021-04-23 NOTE — Progress Notes (Signed)
 Problem: Mobility Impaired (Adult and Pediatric)  Goal: *Acute Goals and Plan of Care (Insert Text)  FUNCTIONAL STATUS PRIOR TO ADMISSION: Patient was modified independent using a single point cane for functional mobility.    HOME SUPPORT PRIOR TO ADMISSION: The patient lived with family but did not require assist.    Physical Therapy Goals  Goals updated 04/23/2021    1.  Patient will move from supine to sit and sit to supine  in bed with minimal assistance/contact guard assist within 7 day(s).    2.  Patient will transfer from bed to chair and chair to bed with minimal assistance/contact guard assist using the least restrictive device within 7 day(s).  3.  Patient will perform sit to stand with minimal assistance/contact guard assist within 7 day(s).  4.  Patient will ambulate with minimal assistance/contact guard assist for 50 feet with the least restrictive device within 7 day(s).     Initiated 04/12/2021  1.  Patient will move from supine to sit and sit to supine , scoot up and down, and roll side to side in bed with independence within 7 day(s).    2.  Patient will transfer from bed to chair and chair to bed with modified independence using the least restrictive device within 7 day(s).  3.  Patient will perform sit to stand with modified independence within 7 day(s).  4.  Patient will ambulate with modified independence for 50 feet with the least restrictive device within 7 day(s).   5.  Patient will ascend/descend 4 stairs with R handrail(s) with supervision/set-up within 7 day(s).      Note:   PHYSICAL THERAPY TREATMENT: WEEKLY RE-ASSESSMENT  Patient: Deborah [name] (66 y.o. female)  Date: 04/23/2021  Diagnosis: Systemic inflammatory response syndrome (SIRS) (HCC) [R65.10] <principal problem not specified>      Precautions:  fall  Chart, physical therapy assessment, plan of care and goals were reviewed.    ASSESSMENT  Patient continues with skilled PT services and is progressing towards goals. Patient received  sitting up in chair, on 2L NC.  Patient still with decreased insight into deficits and poor carry over of instructions.  Would recommend RW for improved stability and performance with upright activity but patient declines RW usage.  Patient able to ambulate short distance but required 1 standing rest break due to increased dyspnea on exertion.  (Unable to get good pleth on pulse ox) with return to sitting sats 96% on 2L.  SABRA     Patient's progression toward goals since last assessment: patient has continues to have medical  complexity and slow progress with therapy.  Still requiring 2L, limited by dyspnea on exertion, decreased strength, balance and overall activity tolerance. She was MOD I at baseline and would continue to benefit from rehab at SNF level at discharge.      Current Level of Function Impacting Discharge (mobility/balance): MIN A with RW    Functional Outcome Measure:  The patient scored 11/24 on the AMPAC outcome measure which is indicative of need for therapy services    .    Other factors to consider for discharge:          PLAN :  Goals have been updated based on progression since last assessment.  Patient continues to benefit from skilled intervention to address the above impairments.    Recommendations and Planned Interventions: bed mobility training, transfer training, gait training, therapeutic exercises, neuromuscular re-education, and therapeutic activities      Frequency/Duration: Patient will be  followed by physical therapy:  5 times a week to address goals.    Recommendation for discharge: (in order for the patient to meet his/her long term goals)  Therapy up to 5 days/week in SNF setting    This discharge recommendation:  Has been made in collaboration with the attending provider and/or case management    IF patient discharges home will need the following DME: to be determined (TBD)       SUBJECTIVE:   Patient stated "."    OBJECTIVE DATA SUMMARY:   HISTORY:    Past Medical History:    Diagnosis Date    (HFpEF) heart failure with preserved ejection fraction (HCC)     Anxiety and depression     Aortic valve replaced     S/p bovine aortic valve replacement.    Asthma     Chronic narcotic use     Chronic obstructive pulmonary disease (HCC)     Chronic pain     CKD (chronic kidney disease), stage III (HCC)     Baseline creatinine is 1.3-1.4 with GFR in the 40s.    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)     GERD (gastroesophageal reflux disease)     History of vascular access device 04/13/2021    4 FR Single PICC for LTABX: R cephalic vessell length 48 CM Max P leave @ 1 CM out; Arm circumferenc 40 CM    Hyperlipidemia     Hypothyroidism     Morbid obesity (HCC)     Neuropathy     Obstructive sleep apnea     Rhinitis      Past Surgical History:   Procedure Laterality Date    COLONOSCOPY N/A 12/01/2020    COLONOSCOPY performed by Jama Sayre, MD at [facility] ENDOSCOPY    HX AORTIC VALVE REPLACEMENT      Bovine bioprosthetic    HX PACEMAKER             Critical Behavior:  Neurologic State: Alert, Eyes open spontaneously  Orientation Level: Oriented X4  Cognition: Follows commands  Safety/Judgement: Awareness of environment  Functional Mobility Training:  Bed Mobility:                    Transfers:  Sit to Stand: Contact guard assistance  Stand to Sit: Minimum assistance        Bed to Chair: Minimum assistance                    Balance:     Ambulation/Gait Training:  Distance (ft): 20 Feet (ft)     Ambulation - Level of Assistance: Minimal assistance        Gait Abnormalities: Decreased step clearance;Path deviations              Speed/Cadence: Slow           Functional Measure:  [employer] AM-PAC      Basic Mobility Inpatient Short Form (6-Clicks) Version 2  How much HELP from another person do you currently need... (If the patient hasn't done an activity recently, how much help from another person do you think they would need if they tried?) Total A Lot A Little None   1.  Turning from your back to  your side while in a flat bed without using bedrails? []   1 [x]   2 []   3  []   4   2.  Moving from lying on your back to sitting on the  side of a flat bed without using bedrails? []   1 [x]   2 []   3  []   4   3.  Moving to and from a bed to a chair (including a wheelchair)? []   1 [x]   2 []   3  []   4   4. Standing up from a chair using your arms (e.g. wheelchair or bedside chair)? []   1 [x]   2 []   3  []   4   5.  Walking in hospital room? []   1 [x]   2 []   3  []   4   6.  Climbing 3-5 steps with a railing? [x]   1 []   2 []   3  []   4     Raw Score: 11/24                            Cutoff score ?171,2,3 had higher odds of discharging home with home health or need of SNF/IPR.    1. Diane U. Jette, Ronal Broody, Vinoth K. Ranganathan, Sandra D. Passek, Gilmore RAMAN. Waldemar Dale EMERSON Aneta.  Validity of the AM-PAC "6-Clicks" Inpatient Daily Activity and Basic Mobility Short Forms. Physical Therapy Mar 2014, 94 (3) 379-391; DOI: 10.2522/ptj.20130199  2. Warren M, Knecht J, Verheijde J, Tompkins J. Association of AM-PAC 6-Clicks Basic Mobility and Daily Activity Scores With Discharge Destination. Phys Ther. 2021 Apr 4;101(4):pzab043. doi: 10.1093/ptj/pzab043. PMID: 66482536.  3. Herbold J, Rajaraman D, Waddell RAMAN, Agayby K, Auburn S. Activity Measure for Post-Acute Care 6-Clicks Basic Mobility Scores Predict Discharge Destination After Acute Care Hospitalization in Select Patient Groups: A Retrospective, Observational Study. Arch Rehabil Res Clin Transl. 2022 Jul 16;4(3):100204. doi: 10.1016/j.arrct.7977.899795. PMID: 63876017; PMCID: EFR0517973.  4. Aneta DELENA Darryle RAMAN, Coster W, Ni P. AM-PAC Short Forms Manual 4.0. Revised 02/2018.       Pain Rating:  None noted    Activity Tolerance:   Fair    After treatment patient left in no apparent distress:   Sitting in chair, Call bell within reach, and Bed / chair alarm activated    COMMUNICATION/COLLABORATION:   The patient's plan of care was discussed with: Registered nurse.     Asberry JINNY Luck, PT, DPT   Time Calculation: 13 mins

## 2021-04-23 NOTE — Progress Notes (Signed)
 Problem: Self Care Deficits Care Plan (Adult)  Goal: *Acute Goals and Plan of Care (Insert Text)  Description: FUNCTIONAL STATUS PRIOR TO ADMISSION: Patient was modified independent using a single point cane for functional mobility. Patient was modified independent for basic and instrumental ADLs. No baseline O2 use.     HOME SUPPORT: The patient lived with her son and his family. Pt reports being home alone during the day.    Occupational Therapy Goals  Initiated 04/12/2021; Goals reviewed and continued at weekly reassessment, 04/20/21;  1.  Patient will perform grooming, standing at sink, with modified independence within 7 day(s).  2.  Patient will perform lower body dressing with supervision/set-up using AD PRN within 7 day(s).  3.  Patient will perform bathing with supervision/set-up within 7 day(s).  4.  Patient will perform toilet transfers with modified independence within 7 day(s).  5.  Patient will perform all aspects of toileting with modified independence within 7 day(s).  6.  Patient will participate in upper extremity therapeutic exercise/activities with independence for 10 minutes within 7 day(s).    7.  Patient will utilize energy conservation techniques during functional activities with verbal cues within 7 day(s).   Outcome: Progressing Towards Goal   OCCUPATIONAL THERAPY TREATMENT  Patient: Deborah [name] (66 y.o. female)  Date: 04/23/2021  Diagnosis: Systemic inflammatory response syndrome (SIRS) (HCC) [R65.10] <principal problem not specified>      Precautions:  fall  Chart, occupational therapy assessment, plan of care, and goals were reviewed.    ASSESSMENT  Patient continues with skilled OT services and is progressing towards goals.  Ms. Vogelgesang was received in the recliner agreeable to activity.  She was able to transfer into the bathroom for ADL retraining.  Patient performed toileting and required return to seated position to complete grooming tasks with setup.  Education provided regarding safe  transfer techniques, adaptive ADL techniques, energy conservation techniques, and pursed lip breathing techniques; She demonstrated good carryover of education.   Patient was received and remained on 2L O2 to maintain SpO2 >90% with activity.  Patient would benefit from continued skilled OT to progress towards goals and improve overall independence.       Current Level of Function Impacting Discharge (ADLs): Functional mobility, CGA-Min A; Toileting, CGA-Min A; Grooming (standing), setup           PLAN :  Patient continues to benefit from skilled intervention to address the above impairments.  Continue treatment per established plan of care to address goals.    Recommend with staff:   Recommend patient be OOB to chair as frequently as tolerated; Goal of 3x/day for all meals for 60 minutes at a time.   For toileting needs, recommend transfers to/from bathroom with staff assist.  Encourage patient involvement in personal care as able.    Encourage exercises frequently throughout the day.       Recommend next OT session: Continue towards set OT goals.    Recommendation for discharge: (in order for the patient to meet his/her long term goals)  Therapy up to 5 days/week in SNF setting    This discharge recommendation:  Has been made in collaboration with the attending provider and/or case management    IF patient discharges home will need the following DME: none       SUBJECTIVE:   Patient agreeable to OT tx.     OBJECTIVE DATA SUMMARY:   Cognitive/Behavioral Status:  Neurologic State: Alert  Orientation Level: Oriented X4  Cognition: Follows commands  Perception: Appears intact  Perseveration: No perseveration noted  Safety/Judgement: Awareness of environment    Functional Mobility and Transfers for ADLs:  Bed Mobility:  Rolling:  (pt was received up and remained up)    Transfers:  Sit to Stand: Contact guard assistance  Functional Transfers  Toilet Transfer : Minimum assistance  Bed to Chair: Minimum  assistance    Balance:  Sitting: Intact  Standing: Intact;With support    ADL Intervention:  Grooming  Grooming Assistance: Set-up  Position Performed: Seated in chair  Washing Face: Set-up  Washing Hands: Set-up  Brushing Teeth: Set-up  Cues: Verbal cues provided    Toileting  Toileting Assistance: Minimum assistance  Bladder Hygiene: Contact guard assistance  Bowel Hygiene: Contact guard assistance  Clothing Management: Minimum assistance  Cues: Verbal cues provided  Adaptive Equipment: Grab bars    Cognitive Retraining  Safety/Judgement: Awareness of environment    Activity Tolerance:   Good    After treatment patient left in no apparent distress:   Sitting in chair, Call bell within reach, and Bed / chair alarm activated    COMMUNICATION/COLLABORATION:   The patient's plan of care was discussed with: Registered nurse and patient .     Ronal Done, OTR/L  Time Calculation: 29 mins

## 2021-04-23 NOTE — Progress Notes (Signed)
 CM follow up:    Call placed to [employer] at [phone], appeal is pending re:  SNF denial.  [employer] rep states decision can take up to 3 days. Provided rep with CM contact number.      Glendale Girt, RN, MSN/Care manager  [phone]

## 2021-04-23 NOTE — Progress Notes (Signed)
 Bedside and Verbal shift change report given to [facility] (Cabin crew) by Benton (offgoing nurse). Report included the following information SBAR, Kardex, ED Summary, Procedure Summary, Intake/Output, MAR, Recent Results, and Cardiac Rhythm A paced    1800 Call received from [employer]. Patients appeal has been approved for Skilled Nursing facility. [id number].  Bedside and Verbal shift change report given to [facility] (Cabin crew) by Alberta (offgoing nurse). Report included the following information SBAR, Kardex, ED Summary, Procedure Summary, Intake/Output, MAR, Recent Results, and Cardiac Rhythm A paced .

## 2021-04-24 LAB — CBC WITH AUTOMATED DIFF
ABS. BASOPHILS: 0 10*3/uL (ref 0.0–0.1)
ABS. EOSINOPHILS: 0.1 10*3/uL (ref 0.0–0.4)
ABS. IMM. GRANS.: 0 10*3/uL (ref 0.00–0.04)
ABS. LYMPHOCYTES: 1.5 10*3/uL (ref 0.8–3.5)
ABS. MONOCYTES: 0.5 10*3/uL (ref 0.0–1.0)
ABS. NEUTROPHILS: 3.7 10*3/uL (ref 1.8–8.0)
ABSOLUTE NRBC: 0 10*3/uL (ref 0.00–0.01)
BASOPHILS: 1 % (ref 0–1)
EOSINOPHILS: 2 % (ref 0–7)
HCT: 29.2 % — ABNORMAL LOW (ref 35.0–47.0)
HGB: 8.9 g/dL — ABNORMAL LOW (ref 11.5–16.0)
IMMATURE GRANULOCYTES: 1 % — ABNORMAL HIGH (ref 0.0–0.5)
LYMPHOCYTES: 26 % (ref 12–49)
MCH: 28.9 PG (ref 26.0–34.0)
MCHC: 30.5 g/dL (ref 30.0–36.5)
MCV: 94.8 FL (ref 80.0–99.0)
MONOCYTES: 9 % (ref 5–13)
MPV: 11.1 FL (ref 8.9–12.9)
NEUTROPHILS: 61 % (ref 32–75)
NRBC: 0 PER 100 WBC
PLATELET: 134 10*3/uL — ABNORMAL LOW (ref 150–400)
RBC: 3.08 M/uL — ABNORMAL LOW (ref 3.80–5.20)
RDW: 14.8 % — ABNORMAL HIGH (ref 11.5–14.5)
WBC: 5.9 10*3/uL (ref 3.6–11.0)

## 2021-04-24 LAB — MAGNESIUM
Magnesium: 1.8 mg/dL (ref 1.6–2.4)
Magnesium: 1.8 mg/dL (ref 1.6–2.4)

## 2021-04-24 LAB — METABOLIC PANEL, BASIC
Anion gap: 0 mmol/L — ABNORMAL LOW (ref 5–15)
BUN/Creatinine ratio: 17 (ref 12–20)
BUN: 17 MG/DL (ref 6–20)
CO2: 34 mmol/L — ABNORMAL HIGH (ref 21–32)
Calcium: 8.9 MG/DL (ref 8.5–10.1)
Chloride: 101 mmol/L (ref 97–108)
Creatinine: 1.01 MG/DL (ref 0.55–1.02)
Glucose: 206 mg/dL — ABNORMAL HIGH (ref 65–100)
Potassium: 4.5 mmol/L (ref 3.5–5.1)
Sodium: 135 mmol/L — ABNORMAL LOW (ref 136–145)
eGFR: >60 mL/min/{1.73_m2} (ref >60)

## 2021-04-24 LAB — GLUCOSE, POC
Glucose (POC): 197 mg/dL — ABNORMAL HIGH (ref 65–117)
Glucose (POC): 143 mg/dL — ABNORMAL HIGH (ref 65–117)
Glucose (POC): 265 mg/dL — ABNORMAL HIGH (ref 65–117)

## 2021-04-24 LAB — CBC WITH AUTO DIFFERENTIAL
Basophils %: 1 % (ref 0–1)
Basophils Absolute: 0 10*3/uL (ref 0.0–0.1)
Eosinophils %: 2 % (ref 0–7)
Eosinophils Absolute: 0.1 10*3/uL (ref 0.0–0.4)
Granulocyte Absolute Count: 0 10*3/uL (ref 0.00–0.04)
Hematocrit: 29.2 % — ABNORMAL LOW (ref 35.0–47.0)
Hemoglobin: 8.9 g/dL — ABNORMAL LOW (ref 11.5–16.0)
Immature Granulocytes: 1 % — ABNORMAL HIGH (ref 0.0–0.5)
Lymphocytes %: 26 % (ref 12–49)
Lymphocytes Absolute: 1.5 10*3/uL (ref 0.8–3.5)
MCH: 28.9 PG (ref 26.0–34.0)
MCHC: 30.5 g/dL (ref 30.0–36.5)
MCV: 94.8 FL (ref 80.0–99.0)
MPV: 11.1 FL (ref 8.9–12.9)
Monocytes %: 9 % (ref 5–13)
Monocytes Absolute: 0.5 10*3/uL (ref 0.0–1.0)
NRBC Absolute: 0 10*3/uL (ref 0.00–0.01)
Neutrophils %: 61 % (ref 32–75)
Neutrophils Absolute: 3.7 10*3/uL (ref 1.8–8.0)
Nucleated RBCs: 0 PER 100 WBC
Platelets: 134 10*3/uL — ABNORMAL LOW (ref 150–400)
RBC: 3.08 M/uL — ABNORMAL LOW (ref 3.80–5.20)
RDW: 14.8 % — ABNORMAL HIGH (ref 11.5–14.5)
WBC: 5.9 10*3/uL (ref 3.6–11.0)

## 2021-04-24 LAB — POCT GLUCOSE
POC Glucose: 197 mg/dL — ABNORMAL HIGH (ref 65–117)
POC Glucose: 143 mg/dL — ABNORMAL HIGH (ref 65–117)
POC Glucose: 265 mg/dL — ABNORMAL HIGH (ref 65–117)

## 2021-04-24 LAB — BASIC METABOLIC PANEL
Anion Gap: 0 mmol/L — ABNORMAL LOW (ref 5–15)
BUN: 17 MG/DL (ref 6–20)
Bun/Cre Ratio: 17 (ref 12–20)
CO2: 34 mmol/L — ABNORMAL HIGH (ref 21–32)
Calcium: 8.9 MG/DL (ref 8.5–10.1)
Chloride: 101 mmol/L (ref 97–108)
Creatinine: 1.01 MG/DL (ref 0.55–1.02)
ESTIMATED GLOMERULAR FILTRATION RATE: >60 mL/min/{1.73_m2} (ref >60)
Glucose: 206 mg/dL — ABNORMAL HIGH (ref 65–100)
Potassium: 4.5 mmol/L (ref 3.5–5.1)
Sodium: 135 mmol/L — ABNORMAL LOW (ref 136–145)

## 2021-04-24 MED ORDER — GUAIFENESIN 600 MG TABLET,EXTENDED RELEASE BIPHASIC 12 HR
600 mg | ORAL_TABLET | Freq: Two times a day (BID) | ORAL | 0 refills | Status: AC
Start: 2021-04-24 — End: 2021-05-08

## 2021-04-24 MED ORDER — MICONAZOLE NITRATE 2 % TOPICAL POWDER
2 % | Freq: Two times a day (BID) | CUTANEOUS | 0 refills | Status: AC
Start: 2021-04-24 — End: ?

## 2021-04-24 MED ORDER — BUMETANIDE 1 MG TAB
1 mg | Freq: Every day | ORAL | Status: DC
Start: 2021-04-24 — End: 2021-04-24
  Administered 2021-04-24: 16:00:00 via ORAL

## 2021-04-24 MED ORDER — BUDESONIDE 0.5 MG/2 ML NEB SUSPENSION
0.5 mg/2 mL | Freq: Two times a day (BID) | RESPIRATORY_TRACT | 0 refills | Status: AC
Start: 2021-04-24 — End: ?

## 2021-04-24 MED ORDER — OXYCODONE 5 MG TAB
5 mg | ORAL_TABLET | Freq: Four times a day (QID) | ORAL | 0 refills | Status: AC | PRN
Start: 2021-04-24 — End: 2021-04-27

## 2021-04-24 MED ORDER — BUMETANIDE 1 MG TAB
1 mg | ORAL_TABLET | Freq: Every day | ORAL | 0 refills | Status: AC
Start: 2021-04-24 — End: ?

## 2021-04-24 MED ORDER — ARFORMOTEROL 15 MCG/2 ML NEB SOLUTION
15 mcg/2 mL | Freq: Two times a day (BID) | RESPIRATORY_TRACT | 0 refills | Status: AC
Start: 2021-04-24 — End: ?

## 2021-04-24 MED ORDER — LIDOCAINE 4 % TOPICAL PATCH (12 HOUR DURATION)
4 % | MEDICATED_PATCH | CUTANEOUS | 0 refills | Status: AC
Start: 2021-04-24 — End: ?

## 2021-04-24 MED ORDER — NALOXONE 4 MG/ACTUATION NASAL SPRAY
4 mg/actuation | NASAL | 0 refills | Status: AC
Start: 2021-04-24 — End: ?

## 2021-04-24 MED ORDER — POLYETHYLENE GLYCOL 3350 17 GRAM (100 %) ORAL POWDER PACKET
17 gram | PACK | Freq: Every day | ORAL | 0 refills | Status: AC | PRN
Start: 2021-04-24 — End: ?

## 2021-04-24 MED ORDER — SODIUM CHLORIDE 0.9 % INJECTION
2 gram | INTRAMUSCULAR | 0 refills | Status: DC
Start: 2021-04-24 — End: 2021-05-29

## 2021-04-24 MED FILL — CARAFATE 1 GRAM TABLET: 1 gram | ORAL | Qty: 1

## 2021-04-24 MED FILL — ASPIRIN 81 MG CHEWABLE TAB: 81 mg | ORAL | Qty: 1

## 2021-04-24 MED FILL — LEVOTHYROXINE 75 MCG TAB: 75 mcg | ORAL | Qty: 2

## 2021-04-24 MED FILL — HEPARIN (PORCINE) 5,000 UNIT/ML IJ SOLN: 5000 unit/mL | INTRAMUSCULAR | Qty: 1

## 2021-04-24 MED FILL — CITALOPRAM 20 MG TAB: 20 mg | ORAL | Qty: 2

## 2021-04-24 MED FILL — ARFORMOTEROL 15 MCG/2 ML NEB SOLUTION: 15 mcg/2 mL | RESPIRATORY_TRACT | Qty: 2

## 2021-04-24 MED FILL — CEFTRIAXONE 2 GRAM SOLUTION FOR INJECTION: 2 gram | INTRAMUSCULAR | Qty: 2

## 2021-04-24 MED FILL — MUCUS RELIEF ER 600 MG TABLET, EXTENDED RELEASE: 600 mg | ORAL | Qty: 1

## 2021-04-24 MED FILL — BUDESONIDE 0.5 MG/2 ML NEB SUSPENSION: 0.5 mg/2 mL | RESPIRATORY_TRACT | Qty: 1

## 2021-04-24 MED FILL — INSULIN LISPRO 100 UNIT/ML INJECTION: 100 unit/mL | SUBCUTANEOUS | Qty: 3

## 2021-04-24 MED FILL — METOPROLOL SUCCINATE SR 25 MG 24 HR TAB: 25 mg | ORAL | Qty: 1

## 2021-04-24 MED FILL — ATORVASTATIN 10 MG TAB: 10 mg | ORAL | Qty: 1

## 2021-04-24 MED FILL — OXYCODONE 5 MG TAB: 5 mg | ORAL | Qty: 1

## 2021-04-24 MED FILL — BUMETANIDE 1 MG TAB: 1 mg | ORAL | Qty: 1

## 2021-04-24 NOTE — Progress Notes (Signed)
 Transition of Care Plan to SNF/Rehab    SNF/Rehab Transition:  Patient has been accepted to [facility] and meets criteria for admission.   [facility] will transported by [facility] and expected to leave at 1300.    Communication to Patient/Family:  Met with patient she is agreeable to the transition.    Communication to SNF/Rehab:  Bedside RN, Alberta, has been notified to update the transition plan to the facility and call report (phone number [phone]).  Discharge information has been updated on the AVS.     Discharge instructions to be fax'd to facility at [facility] # [phone]).     Nursing Please include all hard scripts for controlled substances, med rec and dc summary, and AVS in packet.     Reviewed and confirmed with facility, [facility], can manage the patient care needs for the following:     SNF/Rehab Transition:  PCP/Specialist: as scheduled    Reviewed and confirmed with facility, [facility] they can manage the patient care needs for the following:     Oneil with (X) only those applicable:    Medication:  []   Medications will be available at the facility  [x]   IV Antibiotics   []   Controlled Substance - hard copy to be sent with patient   [x]   Weekly Labs   Documents:  []  Hard RX  []  MAR  []  Kardex  []  AVS  [] Transfer Summary  [] Discharge   Equipment:  [x]   CPAP/BiPAP  []   Wound Vacuum  []   Foley or Urinary Device  []   PICC/Central Line  []   Nebulizer  []   Ventilator   Treatment:  [] Isolation (for MRSA, VRE, etc.)  [] Surgical Drain Management  [] Tracheostomy Care  [] Dressing Changes  [] Dialysis with transportation and chair time   [] PEG Care  [x] Oxygen  [] Daily Weights for Heart Failure   Dietary:  [] Any diet limitations  [] Tube Feedings   [] Total Parenteral Management (TPN)   Eligible for Medicaid Long Term Services and Supports  Yes:  []  Eligible for medical assistance or will become eligible within 180 days and UAI completed.   []  Provider/Patient and/or support system has requested  screening.  []  UAI copy provided to patient or responsible party,   []  UAI unavailable at discharge will send once processed to SNF provider.  []  UAI unavailable at discharged mailed to patient  No:   []  Private pay and is not financially eligible for Medicaid within the next 180 days.  []  Reside out-of-state.  []  A residents of a state owned/operated facility that is licensed  by Department of Behavioral Health and Developmental Services or Social research officer, government Center  []  Enrollment in [place] hospice services  []  Non [place]  citizen  []  Patient /Family declines to have screening completed or provide financial information for screening     Financial Resources:  Medicaid    []  Initiated and application pending   []  Full coverage     Advanced Care Plan:  [] Surrogate Decision Maker of Care  [] POA  [] Communicated Code Status (Full)    Other     Glendale Girt, RN, MSN/Care manager  [phone]

## 2021-04-24 NOTE — Progress Notes (Signed)
CM follow up:    Insurance authorization has been approved for SNF care.  [facility].  Dr. Burnett Corrente notified.  Discussed SNF care at [facility] center, likely discharge today.  Patient agreeable with plan.    Transport referral sent to [facility] via [facility] requesting 1pm pick up. Patient has PICC and requiring 2L/NC oxygen.  Await response.    Midge Aver, RN, MSN/Care manager  [phone]

## 2021-04-24 NOTE — Progress Notes (Signed)
 BiPap order sent to [facility] via [employer] referral. Spoke with [facility] liaison re:  BiPap.    Glendale Girt, RN, MSN/Care manager  [phone]

## 2021-04-24 NOTE — Progress Notes (Signed)
 Bedside and Verbal shift change report given to [facility] (Cabin crew) by Benton (offgoing nurse). Report included the following information SBAR, Kardex, ED Summary, Procedure Summary, Intake/Output, MAR, Recent Results, and Cardiac Rhythm A paced  .  TRANSFER - OUT REPORT:    Verbal report given to Kate(name) on Nakya Weyand  being transferred to [facility] nursing home(unit) for routine progression of care       Report consisted of patient's Situation, Background, Assessment and   Recommendations(SBAR).     Information from the following report(s) SBAR, Kardex, ED Summary, Procedure Summary, Intake/Output, MAR, Recent Results, and Cardiac Rhythm A paced  was reviewed with the receiving nurse.    Lines:   PICC Single Lumen 04/13/21 Right;Cephalic (Active)   Central Line Being Utilized Yes 04/24/21 0800   Criteria for Appropriate Use Long term IV/antibiotic administration 04/24/21 0800   Site Assessment Clean, dry, & intact 04/24/21 0800   Phlebitis Assessment 0 04/24/21 0800   Infiltration Assessment 0 04/24/21 0800   Arm Circumference (cm) 40 cm 04/13/21 1522   Date of Last Dressing Change 04/20/21 04/24/21 0800   Dressing Status Clean, dry, & intact 04/24/21 0800   External Catheter Length (cm) 1 centimeters 04/22/21 1514   Dressing Type Transparent w/CHG gel 04/24/21 0800   Hub Color/Line Status Blue 04/24/21 0800   Action Taken Open ports on tubing capped 04/24/21 0800   Positive Blood Return (Site #1) Yes 04/24/21 0800   Alcohol Cap Used Yes 04/24/21 0744        Opportunity for questions and clarification was provided.      Patient transported with:   Monitor  Registered Nurse

## 2021-04-24 NOTE — Progress Notes (Signed)
DC summary attached to [facility]Port referral.    Midge Aver, RN, MSN/Care manager  [phone]

## 2021-04-24 NOTE — Progress Notes (Signed)
 Transportation update:    [facility] rescheduled transport from 1300 to 1830.  [facility] unable to transport patient today.  [facility], [facility] can transport patient today at 1530.  [facility] scheduled with [facility] with Case management manager approval.    Glendale Girt, RN, MSN/Care manager  [phone]

## 2021-04-24 NOTE — Progress Notes (Signed)
Progress Notes by Donneta Romberg, [place] at 04/24/21 2440                Author: Donneta Romberg, PA  Service: Pulmonary Disease  Author Type: Physician Assistant       Filed: 04/24/21 0747  Date of Service: 04/24/21 0743  Status: Signed           Editor: Donneta Romberg, PA (Physician Assistant)  Cosigner: Gerri Spore, MD at 05/06/21 1027                         Name:  Deborah [name]:  [facility]          DOB:  [birth year]  Admit Date:  04/08/2021          Phone:  [phone]   Room:  317/01          PCP:  Karilyn Cota, NP   MRN:  [id number]          Date:  04/24/2021   Code:  Full Code              Chart and notes reviewed. Data reviewed. I review the patient's current medications in the medical record at each encounter.  I have evaluated and examined the patient.       History of Present Illness:      Presented to ED on 04/08/21 for fever and SOB. Was found to be hypotensive and septic. Wound on foot. Also found to have possible vegetation on TEE.       She was lethargic on 04/14/21. She is on supplemental O2. Was off of CPAP for a few nights. Wore BiPAP last night and ABG much improved. Not lethargic today. Awake and alert.       Chest imaging ordered yesterday and appeared to be worsening. Pulmonology consulted.    She was started on an amio gtt about 5 days ago due to SVT and then transitioned to PO amio.       She feels better today. Still has some SOB and cough, but improved. Minimal wheezing. Afebrile. BP and HR stable. Oxygen saturations 95% on 2L NC.       WBC 3.2   HgB 8.5   Creatinine 0.93   Lactic 0.8   RVP negative 04/14/21   ABG 04/15/21: pH 7.35, pCO2 55, pO2 130, HCO3 29, sO2 98   Pro-BNP 04/15/21: 10,954      Images reviewed:      CXR 04/15/21: lungs demonstrate bilateral interstitial and mild airspace infiltrate worrisome for pneumonia;  similar to the CT scan of 04/14/2021.      CTA Chest 04/14/21: no evidence of pulmonary embolism; extensive groundglass  opacification throughout all lobes  of both lungs; findings are nonspecific, but could represent Covid 19 pneumonia; cardiomegaly; small bilateral pleural effusions with mild bibasilar atelectasis.      CXR 04/08/21: no acute process.       CTA Chest 01/24/21: no PE; no acute pulmonary process; trace right pleural effusion; cirrhosis and portal hypertension.      Interval history:   Afebrile   BP stable   Sats 95% on 2L NC   Did tolerate PAP for about 3 hours last night   Plts 134 - decreased   Creat 1.01 - better   3/12 blood cultures: strep alagalactiae    3/17 blood cultures negative x 5 days   1067m UOP + 1  unmeasurable occurrence       BLE VD neg for DVT   NM scan with no uptake      ROS:  Denies shortness of breath.  Denies CP.  Denies fever, chills, or cough.    Still with LE swelling.  Making progress with therapy.        Past Medical History:        Diagnosis  Date         ?  (HFpEF) heart failure with preserved ejection fraction ([place])       ?  Anxiety and depression       ?  Aortic valve replaced            S/p bovine aortic valve replacement.         ?  Asthma       ?  Chronic narcotic use       ?  Chronic obstructive pulmonary disease (HCC)       ?  Chronic pain       ?  CKD (chronic kidney disease), stage III (HCC)            Baseline creatinine is 1.3-1.4 with GFR in the 40s.         ?  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  GERD (gastroesophageal reflux disease)       ?  History of vascular access device  04/13/2021          4 FR Single PICC for LTABX: R cephalic vessell length 48 CM Max P leave @ 1 CM out; Arm circumferenc 40 CM         ?  Hyperlipidemia       ?  Hypothyroidism       ?  Morbid obesity ([place])       ?  Neuropathy       ?  Obstructive sleep apnea           ?  Rhinitis               Past Surgical History:         Procedure  Laterality  Date          ?  COLONOSCOPY  N/A  12/01/2020          COLONOSCOPY performed by Rachael Darby, MD at [place]          ?  HX AORTIC VALVE  REPLACEMENT              Bovine bioprosthetic          ?  HX PACEMAKER                 Family History         Problem  Relation  Age of Onset          ?  Hypertension  Mother            ?  Hypertension  Father               Social History          Tobacco Use         ?  Smoking status:  Former              Packs/day:  1.00         Years:  40.00         Pack years:  40.00  Types:  Cigarettes         Quit date:  03/21/2010         Years since quitting:  11.1         ?  Smokeless tobacco:  Never       Substance Use Topics         ?  Alcohol use:  Not Currently             Allergies        Allergen  Reactions         ?  Nitroglycerin  Unknown (comments)             hypotension         ?  Aloe Vera  Rash     ?  Hydrochlorothiazide  Other (comments)             Reports 'kidneys dry up"          ?  Tetanus And Diphther. Tox (Pf)  Swelling             Swelling of arm and it turns black             Current Facility-Administered Medications          Medication  Dose  Route  Frequency           ?  guaiFENesin ER (MUCINEX) tablet 600 mg   600 mg  Oral  Q12H     ?  cefTRIAXone (ROCEPHIN) 2 g in 0.9% sodium chloride 20 mL IV syringe   2 g  IntraVENous  Q24H     ?  metoprolol succinate (TOPROL-XL) XL tablet 25 mg   25 mg  Oral  DAILY     ?  sodium chloride 0.9 % bolus infusion 500 mL   500 mL  IntraVENous  DAILY PRN     ?  miconazole (MICOTIN) 2 % powder     Topical  BID     ?  alteplase (CATHFLO) 1 mg in sterile water (preservative free) 1 mL injection   1 mg  InterCATHeter  PRN     ?  HYDROmorphone (DILAUDID) syringe 0.5 mg   0.5 mg  IntraVENous  Q3H PRN     ?  oxyCODONE IR (ROXICODONE) tablet 5 mg   5 mg  Oral  Q6H PRN     ?  lidocaine 4 % patch 2 Patch   2 Patch  TransDERmal  Q24H           ?  heparin (porcine) injection 5,000 Units   5,000 Units  SubCUTAneous  Q8H           ?  aspirin chewable tablet 81 mg   81 mg  Oral  DAILY     ?  levothyroxine (SYNTHROID) tablet 150 mcg   150 mcg  Oral  ACB     ?  citalopram  (CELEXA) tablet 40 mg   40 mg  Oral  DAILY     ?  atorvastatin (LIPITOR) tablet 10 mg   10 mg  Oral  DAILY     ?  montelukast (SINGULAIR) tablet 10 mg   10 mg  Oral  QHS     ?  sucralfate (CARAFATE) tablet 1 g   1 g  Oral  TIDAC     ?  traZODone (DESYREL) tablet 100 mg   100 mg  Oral  QHS     ?  glucose chewable tablet 16  g   4 Tablet  Oral  PRN     ?  glucagon (GLUCAGEN) injection 1 mg   1 mg  IntraMUSCular  PRN     ?  dextrose 10% infusion 0-250 mL   0-250 mL  IntraVENous  PRN     ?  arformoteroL (BROVANA) neb solution 15 mcg   15 mcg  Nebulization  BID RT     ?  budesonide (PULMICORT) 500 mcg/2 ml nebulizer suspension   500 mcg  Nebulization  BID RT     ?  insulin lispro (HUMALOG) injection     SubCUTAneous  QID WITH MEALS     ?  sodium chloride (NS) flush 5-10 mL   5-10 mL  IntraVENous  PRN     ?  sodium chloride (NS) flush 5-40 mL   5-40 mL  IntraVENous  Q8H     ?  acetaminophen (TYLENOL) tablet 650 mg   650 mg  Oral  Q6H PRN          Or           ?  acetaminophen (TYLENOL) suppository 650 mg   650 mg  Rectal  Q6H PRN     ?  polyethylene glycol (MIRALAX) packet 17 g   17 g  Oral  DAILY PRN     ?  ondansetron (ZOFRAN ODT) tablet 4 mg   4 mg  Oral  Q8H PRN          Or           ?  ondansetron (ZOFRAN) injection 4 mg   4 mg  IntraVENous  Q6H PRN              REVIEW OF SYSTEMS    12 point ROS negative except as stated in the HPI.         Physical Exam:    Visit Vitals      BP  127/73 (BP 1 Location: Left lower arm, BP Patient Position: Semi fowlers)     Pulse  85     Temp  98 F (36.7 C)     Resp  18     Ht  '5\' 7"'  (1.702 m)     Wt  (!) 160.8 kg (354 lb 8 oz)     SpO2  95%     Breastfeeding  No        BMI  55.52 kg/m              General:   Alert, cooperative, no distress, appears stated age.        Head:   Normocephalic, without obvious abnormality, atraumatic.        Eyes:   Conjunctivae/corneas clear.         Nose:  Nares normal. Septum midline. Mucosa normal.         Throat:  Lips, mucosa, and tongue normal.          Neck:  Supple, symmetrical, trachea midline, no adenopathy.     Lungs:    CTAB; diminished bases.        Chest wall:   No tenderness or deformity.        Heart:   Regular rate and rhythm, S1, S2 normal, no murmur, click, rub or gallop.     Abdomen:    Soft, non-tender. Bowel sounds normal.      Extremities:  Extremities normal, atraumatic, no cyanosis, + LE edema.     Pulses:  2+  and symmetric all extremities.     Skin:  Skin color, texture, turgor normal. No rashes or lesions        Neurologic:  Grossly nonfocal             Lab Results         Component  Value  Date/Time            Sodium  135 (L)  04/24/2021 12:19 AM       Potassium  4.5  04/24/2021 12:19 AM       Chloride  101  04/24/2021 12:19 AM       CO2  34 (H)  04/24/2021 12:19 AM       BUN  17  04/24/2021 12:19 AM       Creatinine  1.01  04/24/2021 12:19 AM       Glucose  206 (H)  04/24/2021 12:19 AM       Calcium  8.9  04/24/2021 12:19 AM       Magnesium  1.8  04/24/2021 12:19 AM       Phosphorus  3.0  01/26/2021 03:11 AM            Lactic acid  0.8  04/15/2021 03:12 AM             Lab Results         Component  Value  Date/Time            WBC  5.9  04/24/2021 12:19 AM       HGB  8.9 (L)  04/24/2021 12:19 AM       PLATELET  134 (L)  04/24/2021 12:19 AM            MCV  94.8  04/24/2021 12:19 AM             Lab Results         Component  Value  Date/Time            INR  1.1  01/24/2021 01:46 PM       Alk. phosphatase  44 (L)  04/11/2021 05:16 AM       Protein, total  6.5  04/11/2021 05:16 AM       Albumin  2.7 (L)  04/11/2021 05:16 AM            Globulin  3.8  04/11/2021 05:16 AM           No results found for: IRON, FE, TIBC, IBCT, PSAT, FERR        Lab Results         Component  Value  Date/Time            C-Reactive protein  12.40 (H)  04/12/2021 12:47 AM            TSH  10.60 (H)  01/25/2021 04:22 AM            No results found for: PH, PHI, PCO2, PCO2I, PO2, PO2I, HCO3, HCO3I, FIO2, FIO2I         No results found for: CPK, RCK1, RCK2, RCK3, RCK4,  CKNDX, CKND1, TROPT, TROIQ, BNPP, BNP         Lab Results         Component  Value  Date/Time            Culture result:  NO GROWTH 5 DAYS  04/13/2021 04:35 PM       Culture result:  NO GROWTH 5 DAYS  04/12/2021 02:03 PM            Culture result:  NO GROWTH 5 DAYS  04/09/2021 08:53 PM           No results found for: TOXA1, RPR, HBCM, HBSAG, HAAB, HCAB1, HAAT, G6PD, CRYAC, HIVGT, HIVR, HIV1, HIV12, HIVPC, HIVRPI      No results found for: VANCT, CPK        Lab Results         Component  Value  Date/Time            Color  YELLOW/STRAW  04/08/2021 10:13 PM       Appearance  CLEAR  04/08/2021 10:13 PM       pH (UA)  5.5  04/08/2021 10:13 PM       Protein  TRACE (A)  04/08/2021 10:13 PM       Glucose  Negative  04/08/2021 10:13 PM       Ketone  Negative  04/08/2021 10:13 PM       Bilirubin  Negative  04/08/2021 10:13 PM       Blood  Negative  04/08/2021 10:13 PM       Urobilinogen  1.0  04/08/2021 10:13 PM       Nitrites  Negative  04/08/2021 10:13 PM       Leukocyte Esterase  SMALL (A)  04/08/2021 10:13 PM       WBC  5-10  04/08/2021 10:13 PM       RBC  0-5  04/08/2021 10:13 PM            Bacteria  1+ (A)  04/08/2021 10:13 PM           IMPRESSION     Acute hypoxic hypercapnic respiratory failure     Abnormal chest CT - nonspecific ggo bilaterally; amio toxicity vs. PNA vs. edema     Sepsis/GBS Bacteremia - diabetic foot wound - possible vegetation TEE     COPD     SVT/Hx of bioprosthetic AVR/s/p PPM     Acute on chronic CKD     CHF     DM     Hypothyroidism     OSA/OHS      PLAN     Maintain oxygen saturations > 88%. Likely a component of chronic hypoxia as it appears she has likely been poorly compliant with PAP  at home for quite some time.     Exercise oximetry closer to discharge     BiPAP at night and with naps if she agrees, discussed the importance of using nightly.     Pulmicort/Brovana nebs     ID and Cardiology following; CTX per ID.     Diuresis PRN - watch creat     Hold amio; discussed  with Cardiology     Low threshold for IVCS if respiratory status worsens     DVT prophylaxis: Heparin     PT/OT/OOB and moblize     Would benefit from outpatient Pulmonary and Sleep follow-up     Dispo: approved for SNF         Donneta Romberg, [place]

## 2021-04-24 NOTE — Progress Notes (Signed)
 Discharge instructions were reviewed with patient. All questions were answered. Patient will be discharged with his PICC line for IV antibiotics. She will be discharge to [facility] and will be transported via [facility]. Required Prescriptions were placed in envelope for [facility]. Care Plans and Education have been resolved.

## 2021-04-24 NOTE — Progress Notes (Signed)
Medicare pt has received, reviewed, and signed IM letter informing them of their right to appeal the discharge.  Signed copy has been placed on pt bedside chart.  Lezlie Lye CMS

## 2021-04-24 NOTE — Progress Notes (Signed)
 This patient was assisted with Intentional Toileting every 2 hours during this shift as appropriate.  Documentation of ambulation and output reflected on Flowsheet as appropriate.  Purposeful hourly rounding was completed using AIDET and 5Ps.  Outcomes of PHR documented as they occurred. Bed alarm in use as appropriate.  Dual Suction and ambubag in place.     -Janit Pagan, PCT

## 2021-05-02 ENCOUNTER — Encounter: Payer: MEDICARE | Attending: Internal Medicine | Primary: Family

## 2021-05-08 IMAGING — CT CT RENAL STONE PROTOCOL
2 of 4 series · 17 of 46 positions shown, 19 images · non-contrast
Comparison: 11/15/2015

CLINICAL DATA: Right lower quadrant pain

EXAM:
CT ABDOMEN AND PELVIS WITHOUT CONTRAST
TECHNIQUE: Multidetector CT imaging of the abdomen and pelvis was performed
following the standard protocol without IV contrast.

[Series 2: stone full standard · axial · 0.95mm/px · z∈[-578,-118]mm · 14 of 102 slices shown, 16 images]
[im 5/102  soft-tissue]
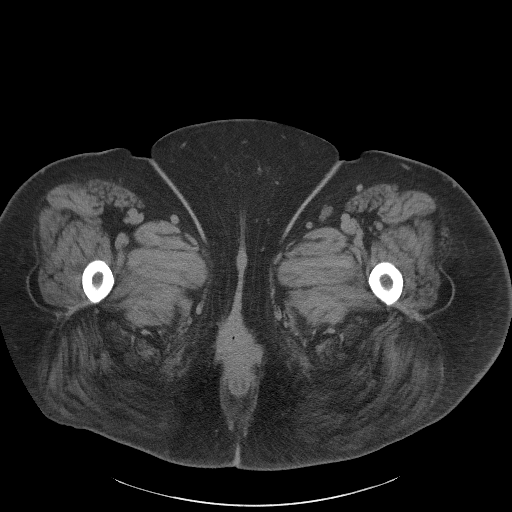
[im 5/102  bone]
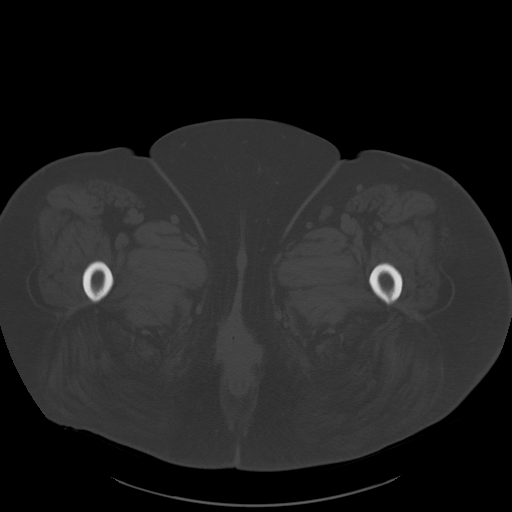
[im 13/102  soft-tissue]
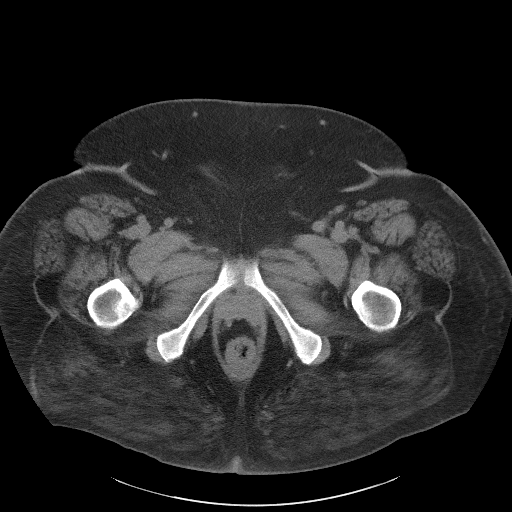
[im 22/102  soft-tissue]
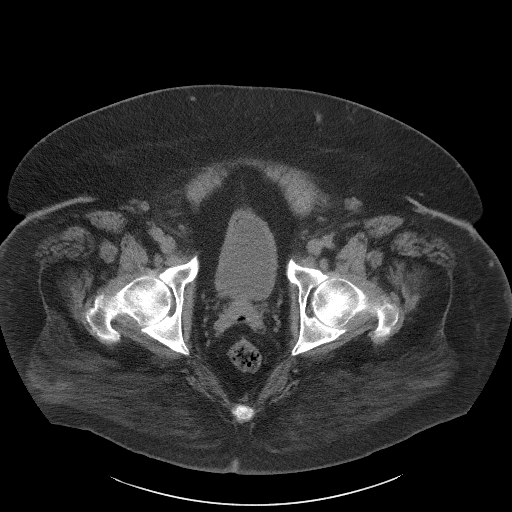
[im 26/102  soft-tissue]
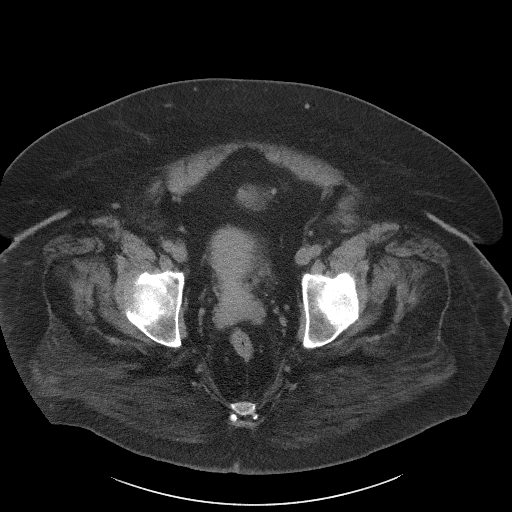
[im 34/102  soft-tissue]
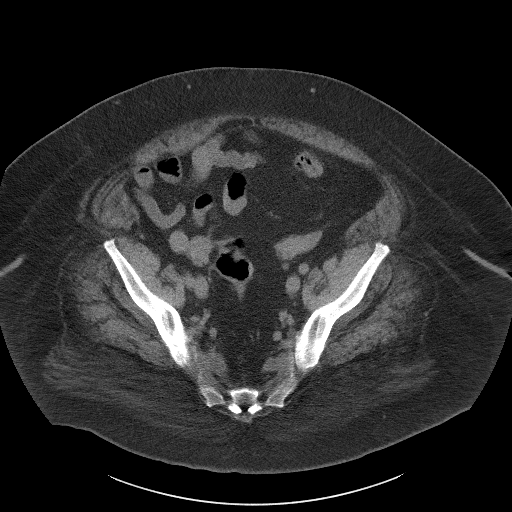
[im 43/102  soft-tissue]
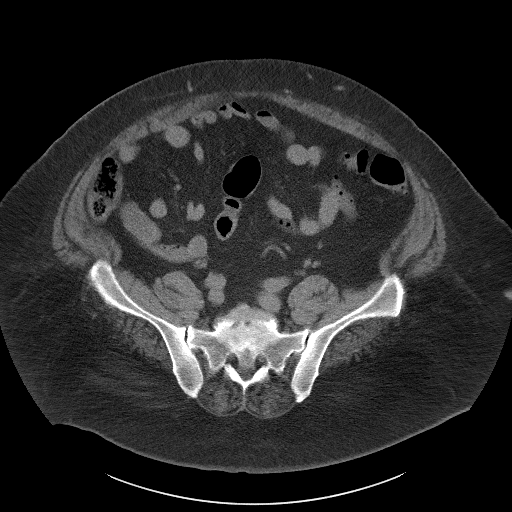
[im 47/102  soft-tissue]
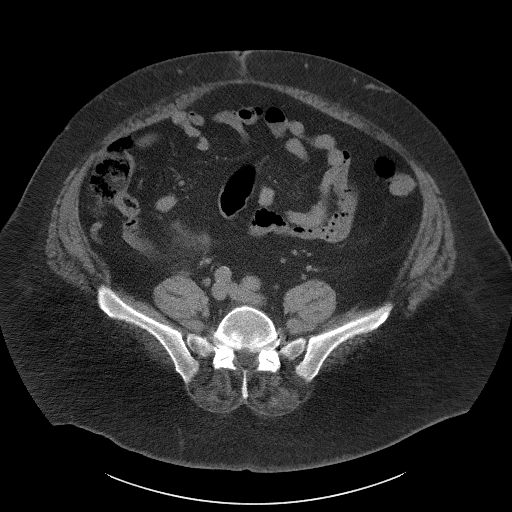
[im 55/102  soft-tissue]
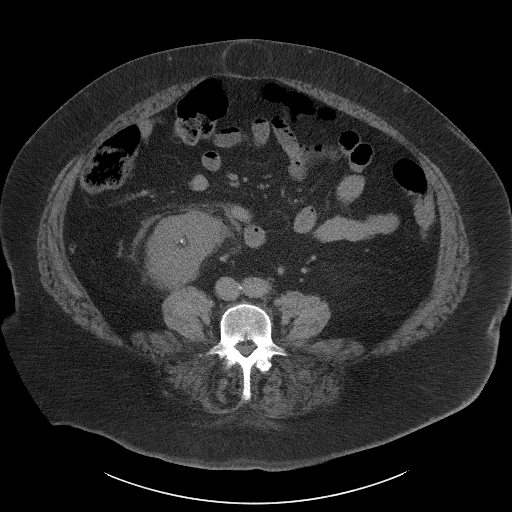
[im 59/102  soft-tissue]
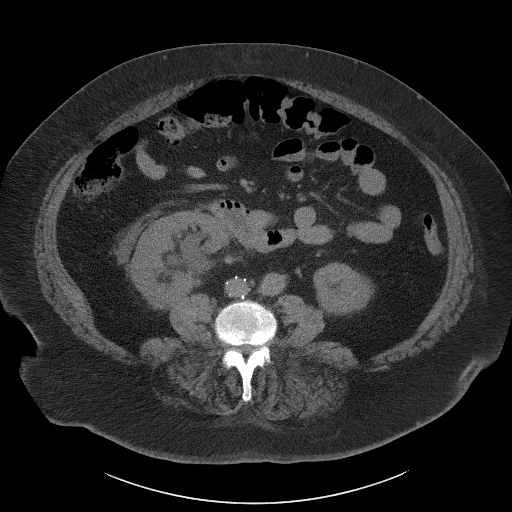
[im 59/102  bone]
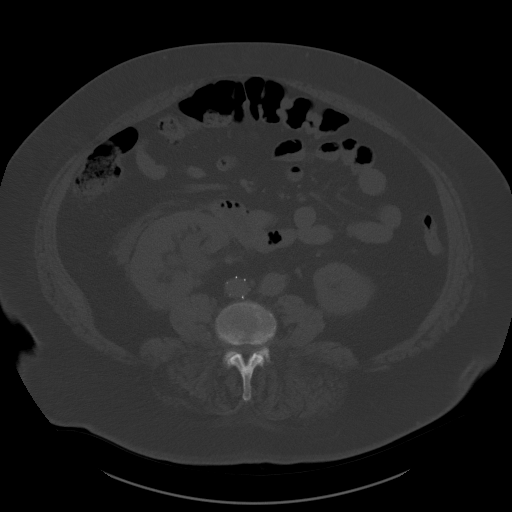
[im 68/102  soft-tissue]
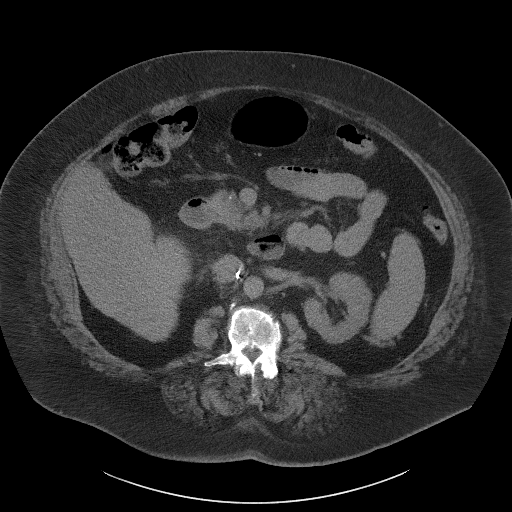
[im 76/102  soft-tissue]
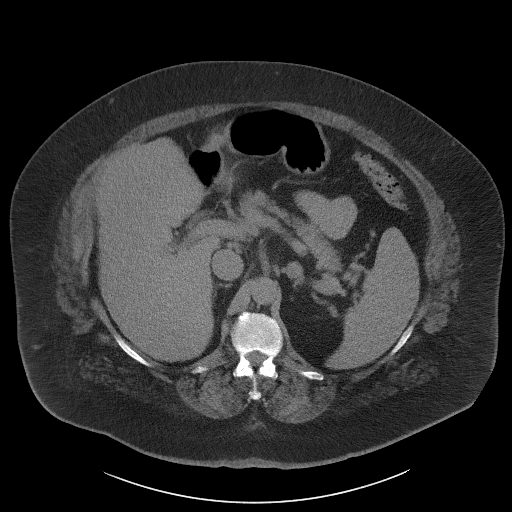
[im 80/102  soft-tissue]
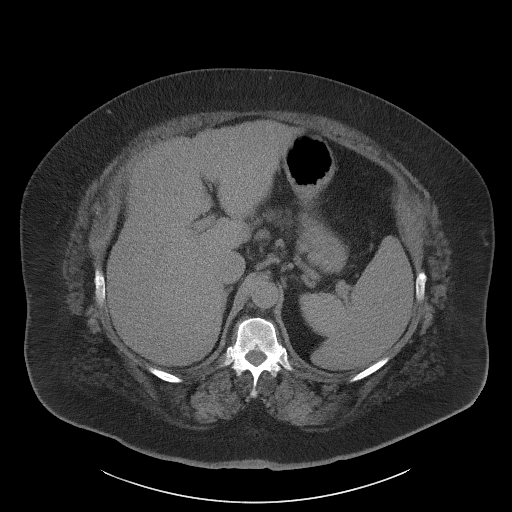
[im 89/102  soft-tissue]
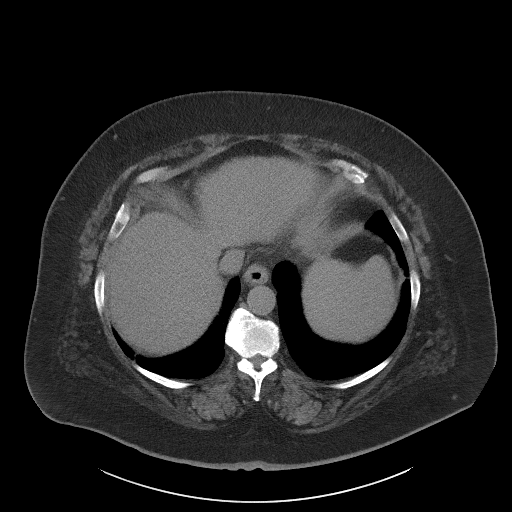
[im 97/102  soft-tissue]
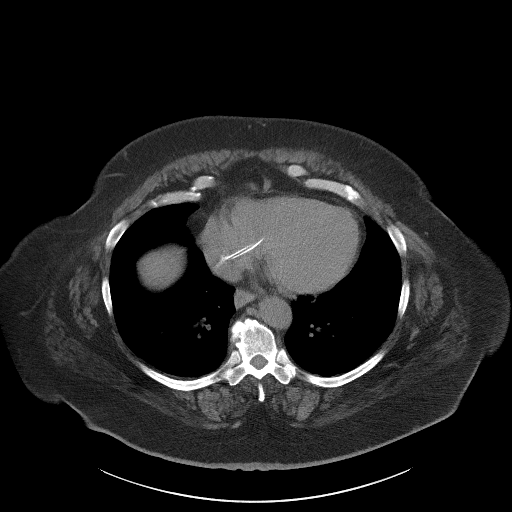

[Series 5: coronal · coronal · 1.00mm/px · 3 of 205 slices shown]
[im 69/205  soft-tissue]
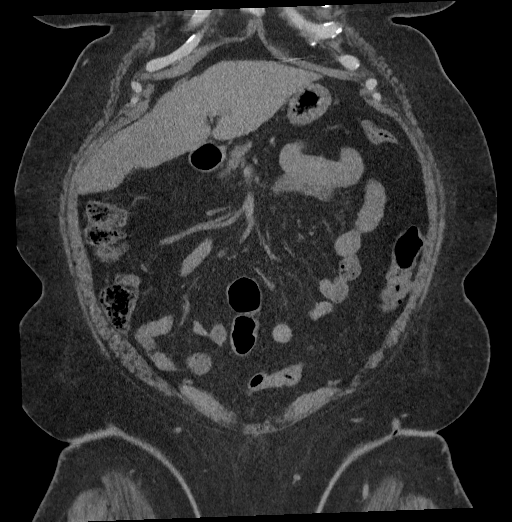
[im 91/205  soft-tissue]
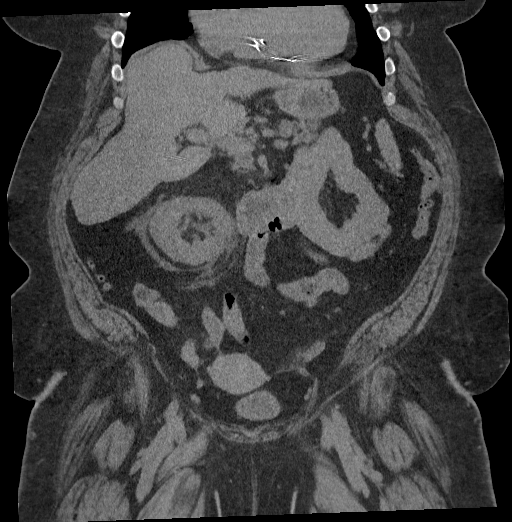
[im 114/205  soft-tissue]
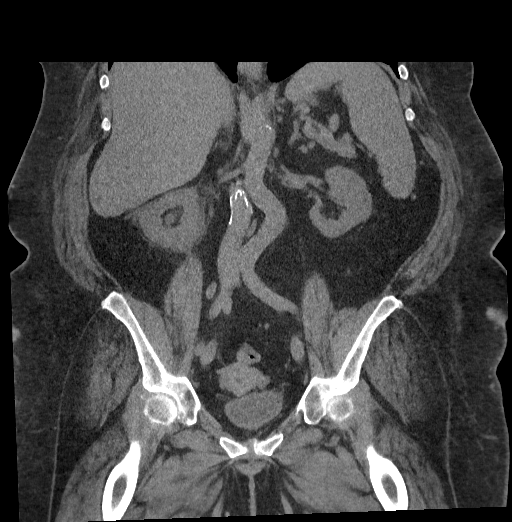

[17 of 46 positions shown; findings below may reference images not displayed]

FINDINGS: Lower chest: No acute abnormality.

Hepatobiliary: Nodular contours of the liver compatible with
cirrhosis. Prior cholecystectomy. No focal hepatic abnormality.

Pancreas: No focal abnormality or ductal dilatation.

Spleen: Spleen is enlarged with a craniocaudal length of 15 cm.

Adrenals/Urinary Tract: 11 mm stone at the right ureteropelvic
junction with moderate right hydronephrosis and perinephric
stranding. Punctate nonobstructing stone in the lower pole of the
right kidney. No hydronephrosis on the left. Adrenal glands and
urinary bladder are unremarkable.

Stomach/Bowel: Normal appendix. Stomach, large and small bowel
grossly unremarkable.

Vascular/Lymphatic: IVC filter in place. Aortic atherosclerosis. No
aneurysm or adenopathy.

Reproductive: Uterus and adnexa unremarkable.  No mass.

Other: No free fluid or free air. Small umbilical hernia containing
fat.

Musculoskeletal: No acute bony abnormality.
IMPRESSION: 11 mm right UPJ stone with moderate right hydronephrosis and
perinephric stranding.

Cirrhosis.  Associated splenomegaly.

Umbilical hernia containing fat.

## 2021-05-09 ENCOUNTER — Inpatient Hospital Stay
Admit: 2021-05-09 | Discharge: 2021-05-29 | Disposition: A | Discharge status: Home Health | Payer: MEDICARE | Attending: Internal Medicine | Admitting: Internal Medicine

## 2021-05-09 ENCOUNTER — Emergency Department: Admit: 2021-05-09 | Payer: MEDICARE | Primary: Family

## 2021-05-09 DIAGNOSIS — I509 Heart failure, unspecified: Secondary | ICD-10-CM

## 2021-05-09 DIAGNOSIS — I130 Hypertensive heart and chronic kidney disease with heart failure and stage 1 through stage 4 chronic kidney disease, or unspecified chronic kidney disease: Principal | ICD-10-CM

## 2021-05-09 NOTE — H&P (Signed)
[facility]  [address] [place], [place]  [address]  [phone]    Hospital Medicine History and Physical      NAME:       Deborah [name]   DOB:       [birth year]   MRN:      [id number]     Date of service:   05/09/2021     Chief  Complaint:  SOB, bilateral lower extremities swelling      History Of Presenting Illness:       Ms. Deborah [name] is a 66 y.o. female who is being admitted for a likely acute on chronic heart failure with preserved ejection fraction (HFpEF). Ms. Deborah [name] was recently discharged from hospital on 04/24/2021 to [facility] for rehab. She says that she had not been receiving her Bumex on a consistent basis and missed several days dosing. She started having a progressively worsening SOB associated with lower extremity swelling. She says she was using oxygen but required supplementation in the ED. She denies any cough or fever. No chills, nausea or vomiting. A CXR done was essentially unchanged. Given her symptoms, I was asked to review her for admission for diuresis and further management.      Allergies   Allergen Reactions    Nitroglycerin Unknown (comments)     hypotension    Aloe Vera Rash    Hydrochlorothiazide Other (comments)     Reports 'kidneys dry up"     Tetanus And Diphther. Tox (Pf) Swelling     Swelling of arm and it turns black       Prior to Admission medications    Medication Sig Start Date End Date Taking? Authorizing Provider   bumetanide (BUMEX) 1 mg tablet Take 1 Tablet by mouth daily. 04/25/21   Horald Chestnut, DO   lidocaine 4 % patch Apply to low back  Apply patch to the affected area for 12 hours a day and remove for 12 hours a day. 04/24/21   Horald Chestnut, DO   arformoteroL (BROVANA) 15 mcg/2 mL nebu neb solution 2 mL by Nebulization route two (2) times a day. 04/24/21   Horald Chestnut, DO   budesonide (PULMICORT) 0.5 mg/2 mL nbsp 2 mL by  Nebulization route two (2) times a day. 04/24/21   Horald Chestnut, DO   cefTRIAXone 2 gram 2 g IV syringe 2 g by IntraVENous route every twenty-four (24) hours for 29 days. 04/24/21 05/23/21  Horald Chestnut, DO   guaiFENesin ER (MUCINEX) 600 mg ER tablet Take 1 Tablet by mouth every twelve (12) hours for 14 days. 04/24/21 05/08/21  Horald Chestnut, DO   miconazole (MICOTIN) 2 % topical powder Apply  to affected area two (2) times a day. APPLY TO bilateral breasts    Nursing, document site in comments 04/24/21   Horald Chestnut, DO   polyethylene glycol (MIRALAX) 17 gram packet Take 1 Packet by mouth daily as needed for Constipation. 04/24/21   Horald Chestnut, DO   naloxone (Narcan) 4 mg/actuation nasal spray Use 1 spray intranasally, then discard. Repeat with new spray every 2 min as needed for opioid overdose symptoms, alternating nostrils. 04/24/21   Horald Chestnut, DO   atorvastatin (LIPITOR) 10 mg tablet Take 10 mg by mouth daily.    Provider, Historical   metoprolol succinate (TOPROL-XL) 25 mg XL tablet Take 1 Tablet by mouth daily. 02/21/21   Thurston Pounds, MD   levothyroxine (SYNTHROID) 150 mcg tablet Take 1 Tablet by mouth  Daily (before breakfast). 01/27/21   Otho Darnerefera, Mesfin, MD   aspirin 81 mg chewable tablet Take 1 Tablet by mouth daily. 12/03/20   Carrolyn MeiersKhan, Salman, MD   acetaminophen (TYLENOL) 500 mg tablet Take 1,000 mg by mouth every six (6) hours as needed for Pain.    Provider, Historical   albuterol (PROVENTIL HFA, VENTOLIN HFA, PROAIR HFA) 90 mcg/actuation inhaler Take 2 Puffs by inhalation every six (6) hours as needed.    Provider, Historical   citalopram (CELEXA) 40 mg tablet Take 40 mg by mouth daily. 02/22/20   Provider, Historical   fluticasone propionate (FLONASE) 50 mcg/actuation nasal spray 2 Sprays by Nasal route daily as needed.    Provider, Historical   sucralfate (CARAFATE) 1 gram tablet Take 1 g by mouth three (3) times daily.    Provider, Historical   traZODone (DESYREL) 100 mg tablet Take 100 mg by mouth nightly.  12/31/19   Provider, Historical   glimepiride (AMARYL) 2 mg tablet Take 2 mg by mouth two (2) times a day.    Provider, Historical   montelukast (SINGULAIR) 10 mg tablet Take 10 mg by mouth nightly.    Provider, Historical       Past Medical History:   Diagnosis Date    (HFpEF) heart failure with preserved ejection fraction (HCC)     Anxiety and depression     Aortic valve replaced     S/p bovine aortic valve replacement.    Asthma     Chronic narcotic use     Chronic obstructive pulmonary disease (HCC)     Chronic pain     CKD (chronic kidney disease), stage III (HCC)     Baseline creatinine is 1.3-1.4 with GFR in the 40s.    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)     GERD (gastroesophageal reflux disease)     History of vascular access device 04/13/2021    4 FR Single PICC for LTABX: R cephalic vessell length 48 CM Max P leave @ 1 CM out; Arm circumferenc 40 CM    Hyperlipidemia     Hypothyroidism     Morbid obesity (HCC)     Neuropathy     Obstructive sleep apnea     Rhinitis         Past Surgical History:   Procedure Laterality Date    COLONOSCOPY N/A 12/01/2020    [facility]    HX AORTIC VALVE REPLACEMENT      Bovine bioprosthetic    HX PACEMAKER         Social History     Tobacco Use    Smoking status: Former     Packs/day: 1.00     Years: 40.00     Pack years: 40.00     Types: Cigarettes     Quit date: 03/21/2010     Years since quitting: 11.1    Smokeless tobacco: Never   Substance Use Topics    Alcohol use: Not Currently        Family History   Problem Relation Age of Onset    Hypertension Mother     Hypertension Father       Review of Systems:    Constitutional ROS: no fever, chills, rigors or night sweats  Respiratory ROS: no cough, sputum, hemoptysis but dyspnea   Cardiovascular ROS: no chest pain, palpitations, orthopnea, PND or syncope  Endocrine ROS: no polydispsia, polyuria, heat or cold intolerance but extremities swelling  Gastrointestinal ROS: no  dysphagia, abdominal pain, nausea, vomiting or diarrhea    Genito-Urinary ROS: no dysuria, frequency, hematuria, retention or flank pain  Musculoskeletal ROS: no joint pain, or muscular tenderness  Neurological ROS: no headache, confusion, focal weakness or any other neurological symptoms  Psychiatric ROS: no depression, anxiety, mood swings  Dermatological ROS: no rash, pruritis, or urticaria  Heme-Lymph ROS: no swollen glands, bleeding    Examination:    Constitutional:  Visit Vitals  BP (!) 136/40   Pulse 88   Temp 98.1 F (36.7 C)   Resp 19   Ht 5\' 7"  (1.702 m)   Wt (!) 160.6 kg (354 lb)   SpO2 92%   BMI 55.44 kg/m         General:  Weak and ill looking patient in no acute distress  Eyes: Pink conjunctivae, PERRLA with no discharge. Normal eye movements  Ear, Nose, Mouth & Throat: No ottorrhea, rhinorrhea, non tender sinuses, dry mucous membranes  Respiratory:  No accessory muscle use, decreased breath sounds with scattered crackles. She has no wheezes  Cardiovascular:  No JVD or murmurs, regular and normal S1, S2 without thrills, bruits. ++ peripheral edema.   GI & GU:  Soft abdomen, obese, non tender, normoactive bowel sounds with no palpable organomegaly  Heme:  No cervical or axillary adenopathy.   Musculoskeletal:  No cyanosis, clubbing, atrophy or deformities  Skin:  No rashes, bruising or ulcers   Neurological: Awake and alert, speech is clear, CNs 2-12 are grossly intact and otherwise non focal  Psychiatric:  Has a fair insight to her illness   ________________________________________________________________________    Data Review: I have reviewed reports and independently interpreted the following  diagnostic tests    Labs:    Recent Labs     05/09/21  2014   WBC 5.6   HGB 9.3*   HCT 30.7*   PLT 83*     Recent Labs     05/09/21  2014   NA 140   K 3.3*   CL 99   CO2 39*   GLU 174*   BUN 18   CREA 1.36*   CA 9.1   MG 1.4*   ALB 2.9*   ALT 23     No components found for: GLPOC  No results for input(s):  PH, PCO2, PO2, HCO3, FIO2 in the last 72 hours.  No results for input(s): INR, INREXT, INREXT in the last 72 hours.    Imaging Studies:      Chest X-ray - reviewed    Personally reviewed 12 lead EKG: multiple PVCs. Reviewed telemetry - paced.     I have also reviewed available old medical records.     Assessment & Impression:     Ms. Folkerts is a 67 y.o. female being evaluated for:     Principal Problem:    Acute on chronic heart failure with preserved ejection fraction (HFpEF) (HCC) (05/09/2021)    Active Problems:    Thrombocytopenia (HCC) (04/30/2020)      CKD (chronic kidney disease), stage III (HCC) (04/30/2020)      Aortic valve replaced ()      Pacemaker ()      Hypothyroidism ()      Anxiety and depression ()      DM (diabetes mellitus), type 2 with complications (HCC) ()      Hyperlipidemia ()      GERD (gastroesophageal reflux disease) ()      Bacteremia due to group  B Streptococcus (05/09/2021)      BMI 50.0-59.9, adult (HCC) (05/09/2021)      Hypokalemia due to loss of potassium (05/09/2021)      Acute on chronic respiratory failure with hypoxia and hypercapnia (HCC) (05/09/2021)      OSA (obstructive sleep apnea) (05/09/2021)      COPD (chronic obstructive pulmonary disease) (HCC) (05/09/2021)      History of PSVT (paroxysmal supraventricular tachycardia) (05/09/2021)      Anemia (05/09/2021)      Hypomagnesemia (05/09/2021)         Plan of management:    Acute on chronic heart failure with preserved ejection fraction (HFpEF) (HCC) (05/09/2021) POA: likely triggered by missed diuretic use. Although her pro-BNP is better and CXR overall unchanged, she does appear symptomatic. Admit to hospital. Echo done 03/2021 showed an EF of 50-55%. Start IV Bumex. Fluid restriction. Low sodium diet. Consult cardiology her other ailments    Bacteremia due to group B Streptococcus (05/09/2021) POA: during recent admission, she had GBS bacteremia complicated by pacemaker line vegetation. Seen by cardiology and ID then. Continue IV  Ceftriaxone scheduled to run through 05/23/21.    Acute on chronic respiratory failure with hypoxia and hypercapnia (HCC) (05/09/2021) POA: continue supplemental oxygen and wean as tolerated to keep sats > 90%    Hypokalemia due to loss of potassium /  Hypomagnesemia (05/09/2021) POA: replenish and monitor lytes    DM (diabetes mellitus), type 2 with complications POA: last A1c 5.7. Monitor blood glucose. Resume Amaryl     Thrombocytopenia (HCC) (04/30/2020) POA: unclear etiology. Chronic. Prior CT scans showed hepatic steatosis. Monitor for now    CKD (chronic kidney disease), stage III (HCC) (04/30/2020) POA: monitor renal function with diuresis    Pacemaker / Aortic valve replaced / History of PSVT (paroxysmal supraventricular tachycardia) (05/09/2021) POA: she has a hx of a bioprosthetic AVR. Her pacer is not MRi compatible. Monitor     Hypothyroidism PO: last TSH was 10 in 10/2020. Repeat it and resume Levothyroxine    BMI 50.0-59.9, adult (HCC) (05/09/2021) /  OSA (obstructive sleep apnea) (05/09/2021) POA: resume BiPAP at night    COPD (chronic obstructive pulmonary disease) (HCC) (05/09/2021) POA: not in any exacerbation. Nebs with Brovana, Pulmicort.     Anemia (05/09/2021) POA: likely due to chronic illness. Monitor    Anxiety and depression POA: resume Celexa.    Hyperlipidemia POA: continue Lipitor    Code Status:  Full    Surrogate decision maker: Family    Certification: Given the high risk of decompensation, this patient requires a two midnight stay for further management.      Level of care: Telemetry    Risk of deterioration: high      Total time spent for the care of the patient: 33 Minutes                  Care Plan discussed with: Patient, Nursing Staff and ED physician    Prophylaxis:  H2B/PPI as needed. Thrombocytopenia (hold chemical anticoagulation)    Probable Disposition:  SNF/LTC    PCP:      [name], NP          ___________________________________________________    Attending  Physician: Melynda Keller, MD

## 2021-05-09 NOTE — ED Notes (Signed)
EMS reports pt had recent weight gain and fluid retention. EMS reports pt has pacemaker, and recent infection of pacemaker leads.

## 2021-05-09 NOTE — ED Notes (Signed)
Patient is being admitted to the hospital.  The results of their tests and reasons for their admission have been discussed with them and/or available family.  They convey agreement and understanding for the need to be admitted and for their admission diagnosis.  Consultation will be made now with the inpatient physician for hospitalization.    [facility] Consult for Admission  9:10 PM    ED Room Number: ER08/08  Patient Name and age:  Deborah [name] 66 y.o.  female  Working Diagnosis:   1. Acute on chronic congestive heart failure, unspecified heart failure type (HCC)      COVID-19 Suspicion:  no  Sepsis present:  no  Reassessment needed: no  Code Status:  Full Code  Readmission: yes  Isolation Requirements:  no  Recommended Level of Care:  telemetry  Department: [facility] - [phone]  Admitting Provider: Dr. Lorette Ang  Other:  oral diuretic was increased without improvement

## 2021-05-09 NOTE — ED Provider Notes (Signed)
The history is provided by the patient and the EMS personnel. No language interpreter was used.   Shortness of Breath  This is a recurrent problem. The current episode started more than 2 days ago. The problem has been gradually worsening. Associated symptoms include PND, orthopnea, leg pain and leg swelling. Pertinent negatives include no fever, no headaches, no coryza, no rhinorrhea, no sore throat, no swollen glands, no ear pain, no neck pain, no cough, no sputum production, no hemoptysis, no wheezing, no chest pain, no syncope, no vomiting, no abdominal pain, no rash and no claudication. The treatment provided no relief. She has had Prior hospitalizations. She has had Prior ED visits. Associated medical issues include COPD and heart failure.      Past Medical History:   Diagnosis Date    (HFpEF) heart failure with preserved ejection fraction (HCC)     Anxiety and depression     Aortic valve replaced     S/p bovine aortic valve replacement.    Asthma     Chronic narcotic use     Chronic obstructive pulmonary disease (HCC)     Chronic pain     CKD (chronic kidney disease), stage III (HCC)     Baseline creatinine is 1.3-1.4 with GFR in the 40s.    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)     GERD (gastroesophageal reflux disease)     History of vascular access device 04/13/2021    4 FR Single PICC for LTABX: R cephalic vessell length 48 CM Max P leave @ 1 CM out; Arm circumferenc 40 CM    Hyperlipidemia     Hypothyroidism     Morbid obesity (HCC)     Neuropathy     Obstructive sleep apnea     Rhinitis        Past Surgical History:   Procedure Laterality Date    COLONOSCOPY N/A 12/01/2020    [facility]    HX AORTIC VALVE REPLACEMENT      Bovine bioprosthetic    HX PACEMAKER           Family History:   Problem Relation Age of Onset    Hypertension Mother     Hypertension Father        Social History     Socioeconomic History    Marital status: SINGLE     Spouse name: Not on  file    Number of children: Not on file    Years of education: Not on file    Highest education level: Not on file   Occupational History    Not on file   Tobacco Use    Smoking status: Former     Packs/day: 1.00     Years: 40.00     Pack years: 40.00     Types: Cigarettes     Quit date: 03/21/2010     Years since quitting: 11.1    Smokeless tobacco: Never   Vaping Use    Vaping Use: Never used   Substance and Sexual Activity    Alcohol use: Not Currently    Drug use: Never    Sexual activity: Not on file   Other Topics Concern    Not on file   Social History Narrative    Not on file     Social Determinants of Health     Financial Resource Strain: Not on file   Food Insecurity: Not on file   Transportation  Needs: Not on file   Physical Activity: Not on file   Stress: Not on file   Social Connections: Not on file   Intimate Partner Violence: Not on file   Housing Stability: Not on file         ALLERGIES: Nitroglycerin, Aloe vera, Hydrochlorothiazide, and Tetanus and diphther. tox (pf)    Review of Systems   Constitutional:  Negative for activity change, chills and fever.   HENT:  Negative for ear pain, nosebleeds, rhinorrhea, sore throat, trouble swallowing and voice change.    Eyes:  Negative for visual disturbance.   Respiratory:  Positive for shortness of breath. Negative for cough, hemoptysis, sputum production and wheezing.    Cardiovascular:  Positive for orthopnea, leg swelling and PND. Negative for chest pain, palpitations, claudication and syncope.   Gastrointestinal:  Negative for abdominal pain, constipation, diarrhea, nausea and vomiting.   Genitourinary:  Negative for difficulty urinating, dysuria, hematuria and urgency.   Musculoskeletal:  Negative for back pain, neck pain and neck stiffness.   Skin:  Negative for color change and rash.   Allergic/Immunologic: Negative for immunocompromised state.   Neurological:  Negative for dizziness, seizures, syncope, weakness, light-headedness, numbness and  headaches.   Psychiatric/Behavioral:  Negative for behavioral problems, confusion, hallucinations, self-injury and suicidal ideas.      There were no vitals filed for this visit.         Physical Exam  Vitals and nursing note reviewed.   Constitutional:       General: She is not in acute distress.     Appearance: She is well-developed. She is not diaphoretic.      Interventions: Nasal cannula in place.   HENT:      Head: Atraumatic.   Neck:      Trachea: No tracheal deviation.   Cardiovascular:      Rate and Rhythm: Normal rate and regular rhythm.      Comments: Warm and well perfused  Pulmonary:      Effort: Pulmonary effort is normal. No respiratory distress.      Breath sounds: Decreased breath sounds present.   Musculoskeletal:         General: Normal range of motion.      Right lower leg: Edema present.      Left lower leg: Edema present.   Skin:     General: Skin is warm and dry.   Neurological:      Mental Status: She is alert.      Coordination: Coordination normal.   Psychiatric:         Behavior: Behavior normal.         Thought Content: Thought content normal.         Judgment: Judgment normal.        Medical Decision Making  Amount and/or Complexity of Data Reviewed  Labs: ordered.  Radiology: ordered.  ECG/medicine tests: ordered.      ED Course as of 05/09/21 2031   Wed May 09, 2021   2020 EKG interpretation: (Preliminary)  Rhythm: sinus bradycardia; and irregular. Rate (approx.): 97; Axis: normal; P wave: normal; QRS interval: normal ; ST/T wave: non-specific changes; PAC's and PVC's, RBBB; Other findings: abnormal ekg   [FD]      ED Course User Index  [FD] Porfiria Heinrich, Marye Round, MD   This is a 66 year old female with past medical history, review of systems, physical exam as above, presenting from rehab for complaints of shortness of breath.  Patient states recently  discharged to rehab after hospital stay, chart review indicates respiratory failure secondary to congestive heart failure, sepsis, secondary  to osteomyelitis as well as possibly infected pacemaker leads.  Patient states she was not receiving her Bumex while at her rehab facility, until the last couple days when she began to complain of increasing dyspnea, leg and abdominal swelling.  She denies recurrent fevers.  Chart review seemed indicates she was discharged on 2 L of oxygen, however now using 2-4, and rest.  Patient states her blood pressure has been decreasing as well.  Upon arrival she is noted to be normotensive, afebrile without tachycardia, satting well on 2 L.  She has bilateral lower extremity pitting edema, soft abdomen, diminished breath sounds throughout.  Differential includes pneumonia, ACS, congestive heart failure exacerbation.  Discussed with patient will obtain CMP, CBC, chest x-ray, cardiac enzymes and BMP, likely provide additional diuretics, patient may require admission for further care and evaluation.    Procedures    SIGN OUT:  8:31 PM  Discussed pt's hx, disposition, and available diagnostic and imaging results with Dr. Arvin Collard. Reviewed care plans. Both providers and patient are in agreement with care plan. Dr. Dudley Major is transferring care of the pt to Dr. Arvin Collard at this time.

## 2021-05-10 LAB — METABOLIC PANEL, COMPREHENSIVE
A-G Ratio: 0.6 — ABNORMAL LOW (ref 1.1–2.2)
A-G Ratio: 0.6 — ABNORMAL LOW (ref 1.1–2.2)
ALT (SGPT): 23 U/L (ref 12–78)
ALT (SGPT): 24 U/L (ref 12–78)
AST (SGOT): 18 U/L (ref 15–37)
AST (SGOT): 14 U/L — ABNORMAL LOW (ref 15–37)
Albumin: 2.9 g/dL — ABNORMAL LOW (ref 3.5–5.0)
Albumin: 2.8 g/dL — ABNORMAL LOW (ref 3.5–5.0)
Alk. phosphatase: 61 U/L (ref 45–117)
Alk. phosphatase: 60 U/L (ref 45–117)
Anion gap: 2 mmol/L — ABNORMAL LOW (ref 5–15)
Anion gap: 0 mmol/L — ABNORMAL LOW (ref 5–15)
BUN/Creatinine ratio: 13 (ref 12–20)
BUN/Creatinine ratio: 14 (ref 12–20)
BUN: 18 MG/DL (ref 6–20)
BUN: 17 MG/DL (ref 6–20)
Bilirubin, total: 1.4 MG/DL — ABNORMAL HIGH (ref 0.2–1.0)
Bilirubin, total: 1.5 MG/DL — ABNORMAL HIGH (ref 0.2–1.0)
CO2: 39 mmol/L — ABNORMAL HIGH (ref 21–32)
CO2: 39 mmol/L — ABNORMAL HIGH (ref 21–32)
Calcium: 9.1 MG/DL (ref 8.5–10.1)
Calcium: 8.7 MG/DL (ref 8.5–10.1)
Chloride: 99 mmol/L (ref 97–108)
Chloride: 98 mmol/L (ref 97–108)
Creatinine: 1.36 MG/DL — ABNORMAL HIGH (ref 0.55–1.02)
Creatinine: 1.22 MG/DL — ABNORMAL HIGH (ref 0.55–1.02)
Globulin: 4.5 g/dL — ABNORMAL HIGH (ref 2.0–4.0)
Globulin: 4.4 g/dL — ABNORMAL HIGH (ref 2.0–4.0)
Glucose: 174 mg/dL — ABNORMAL HIGH (ref 65–100)
Glucose: 119 mg/dL — ABNORMAL HIGH (ref 65–100)
Potassium: 3.3 mmol/L — ABNORMAL LOW (ref 3.5–5.1)
Potassium: 3.2 mmol/L — ABNORMAL LOW (ref 3.5–5.1)
Protein, total: 7.4 g/dL (ref 6.4–8.2)
Protein, total: 7.2 g/dL (ref 6.4–8.2)
Sodium: 140 mmol/L (ref 136–145)
Sodium: 137 mmol/L (ref 136–145)
eGFR: 43 mL/min/{1.73_m2} — ABNORMAL LOW (ref >60)
eGFR: 50 mL/min/{1.73_m2} — ABNORMAL LOW (ref >60)

## 2021-05-10 LAB — CBC WITH AUTOMATED DIFF
ABS. BASOPHILS: 0.1 10*3/uL (ref 0.0–0.1)
ABS. BASOPHILS: 0 10*3/uL (ref 0.0–0.1)
ABS. EOSINOPHILS: 0.2 10*3/uL (ref 0.0–0.4)
ABS. EOSINOPHILS: 0.2 10*3/uL (ref 0.0–0.4)
ABS. IMM. GRANS.: 0 10*3/uL (ref 0.00–0.04)
ABS. IMM. GRANS.: 0 10*3/uL (ref 0.00–0.04)
ABS. LYMPHOCYTES: 1.3 10*3/uL (ref 0.8–3.5)
ABS. LYMPHOCYTES: 1.3 10*3/uL (ref 0.8–3.5)
ABS. MONOCYTES: 0.4 10*3/uL (ref 0.0–1.0)
ABS. MONOCYTES: 0.4 10*3/uL (ref 0.0–1.0)
ABS. NEUTROPHILS: 3.6 10*3/uL (ref 1.8–8.0)
ABS. NEUTROPHILS: 3.1 10*3/uL (ref 1.8–8.0)
ABSOLUTE NRBC: 0 10*3/uL (ref 0.00–0.01)
ABSOLUTE NRBC: 0 10*3/uL (ref 0.00–0.01)
BASOPHILS: 1 % (ref 0–1)
BASOPHILS: 0 % (ref 0–1)
EOSINOPHILS: 3 % (ref 0–7)
EOSINOPHILS: 4 % (ref 0–7)
HCT: 30.7 % — ABNORMAL LOW (ref 35.0–47.0)
HCT: 29.6 % — ABNORMAL LOW (ref 35.0–47.0)
HGB: 9.3 g/dL — ABNORMAL LOW (ref 11.5–16.0)
HGB: 8.9 g/dL — ABNORMAL LOW (ref 11.5–16.0)
IMMATURE GRANULOCYTES: 0 % (ref 0.0–0.5)
LYMPHOCYTES: 26 % (ref 12–49)
MCH: 28.6 PG (ref 26.0–34.0)
MCH: 28.8 PG (ref 26.0–34.0)
MCHC: 30.3 g/dL (ref 30.0–36.5)
MCHC: 30.1 g/dL (ref 30.0–36.5)
MCV: 94.5 FL (ref 80.0–99.0)
MCV: 95.8 FL (ref 80.0–99.0)
MONOCYTES: 7 % (ref 5–13)
MONOCYTES: 8 % (ref 5–13)
MPV: 11.2 FL (ref 8.9–12.9)
MPV: 11 FL (ref 8.9–12.9)
NEUTROPHILS: 66 % (ref 32–75)
NEUTROPHILS: 62 % (ref 32–75)
NRBC: 0 PER 100 WBC
NRBC: 0 PER 100 WBC
PLATELET: 83 10*3/uL — ABNORMAL LOW (ref 150–400)
PLATELET: 77 10*3/uL — ABNORMAL LOW (ref 150–400)
RBC: 3.25 M/uL — ABNORMAL LOW (ref 3.80–5.20)
RBC: 3.09 M/uL — ABNORMAL LOW (ref 3.80–5.20)
RDW: 16 % — ABNORMAL HIGH (ref 11.5–14.5)
RDW: 16.1 % — ABNORMAL HIGH (ref 11.5–14.5)
WBC: 5.6 10*3/uL (ref 3.6–11.0)
WBC: 5 10*3/uL (ref 3.6–11.0)

## 2021-05-10 LAB — TROPONIN-HIGH SENSITIVITY
Troponin-High Sensitivity: 82 ng/L — ABNORMAL HIGH (ref 0–51)
Troponin-High Sensitivity: 70 ng/L — ABNORMAL HIGH (ref 0–51)

## 2021-05-10 LAB — GLUCOSE, POC
Glucose (POC): 165 mg/dL — ABNORMAL HIGH (ref 65–117)
Glucose (POC): 132 mg/dL — ABNORMAL HIGH (ref 65–117)
Glucose (POC): 154 mg/dL — ABNORMAL HIGH (ref 65–117)

## 2021-05-10 LAB — SAMPLES BEING HELD

## 2021-05-10 LAB — MAGNESIUM
Magnesium: 1.4 mg/dL — ABNORMAL LOW (ref 1.6–2.4)
Magnesium: 1.4 mg/dL — ABNORMAL LOW (ref 1.6–2.4)
Magnesium: 3 mg/dL — ABNORMAL HIGH (ref 1.6–2.4)
Magnesium: 3 mg/dL — ABNORMAL HIGH (ref 1.6–2.4)

## 2021-05-10 LAB — BLOOD GAS, ARTERIAL
BASE EXCESS: 9 mmol/L
BICARBONATE: 37 mmol/L — ABNORMAL HIGH (ref 22–26)
FIO2: 45 %
O2 SAT: 99 % — ABNORMAL HIGH (ref 92–97)
PCO2: 62 mmHg — ABNORMAL HIGH (ref 35–45)
PEEP/CPAP: 12
PO2: 155 mmHg — ABNORMAL HIGH (ref 80–100)
pH: 7.39 (ref 7.35–7.45)

## 2021-05-10 LAB — NT-PRO BNP: NT pro-BNP: 6505 PG/ML — ABNORMAL HIGH (ref <125)

## 2021-05-10 LAB — TSH 3RD GENERATION
TSH: 0.76 u[IU]/mL (ref 0.36–3.74)
TSH: 0.76 u[IU]/mL (ref 0.36–3.74)

## 2021-05-10 LAB — COMPREHENSIVE METABOLIC PANEL
ALT: 23 U/L (ref 12–78)
ALT: 24 U/L (ref 12–78)
AST: 18 U/L (ref 15–37)
AST: 14 U/L — ABNORMAL LOW (ref 15–37)
Albumin/Globulin Ratio: 0.6 — ABNORMAL LOW (ref 1.1–2.2)
Albumin/Globulin Ratio: 0.6 — ABNORMAL LOW (ref 1.1–2.2)
Albumin: 2.9 g/dL — ABNORMAL LOW (ref 3.5–5.0)
Albumin: 2.8 g/dL — ABNORMAL LOW (ref 3.5–5.0)
Alkaline Phosphatase: 61 U/L (ref 45–117)
Alkaline Phosphatase: 60 U/L (ref 45–117)
Anion Gap: 2 mmol/L — ABNORMAL LOW (ref 5–15)
Anion Gap: 0 mmol/L — ABNORMAL LOW (ref 5–15)
BUN: 18 MG/DL (ref 6–20)
BUN: 17 MG/DL (ref 6–20)
Bun/Cre Ratio: 13 (ref 12–20)
Bun/Cre Ratio: 14 (ref 12–20)
CO2: 39 mmol/L — ABNORMAL HIGH (ref 21–32)
CO2: 39 mmol/L — ABNORMAL HIGH (ref 21–32)
Calcium: 9.1 MG/DL (ref 8.5–10.1)
Calcium: 8.7 MG/DL (ref 8.5–10.1)
Chloride: 99 mmol/L (ref 97–108)
Chloride: 98 mmol/L (ref 97–108)
Creatinine: 1.36 MG/DL — ABNORMAL HIGH (ref 0.55–1.02)
Creatinine: 1.22 MG/DL — ABNORMAL HIGH (ref 0.55–1.02)
ESTIMATED GLOMERULAR FILTRATION RATE: 43 mL/min/{1.73_m2} — ABNORMAL LOW (ref >60)
ESTIMATED GLOMERULAR FILTRATION RATE: 50 mL/min/{1.73_m2} — ABNORMAL LOW (ref >60)
Globulin: 4.5 g/dL — ABNORMAL HIGH (ref 2.0–4.0)
Globulin: 4.4 g/dL — ABNORMAL HIGH (ref 2.0–4.0)
Glucose: 174 mg/dL — ABNORMAL HIGH (ref 65–100)
Glucose: 119 mg/dL — ABNORMAL HIGH (ref 65–100)
Potassium: 3.3 mmol/L — ABNORMAL LOW (ref 3.5–5.1)
Potassium: 3.2 mmol/L — ABNORMAL LOW (ref 3.5–5.1)
Sodium: 140 mmol/L (ref 136–145)
Sodium: 137 mmol/L (ref 136–145)
Total Bilirubin: 1.4 MG/DL — ABNORMAL HIGH (ref 0.2–1.0)
Total Bilirubin: 1.5 MG/DL — ABNORMAL HIGH (ref 0.2–1.0)
Total Protein: 7.4 g/dL (ref 6.4–8.2)
Total Protein: 7.2 g/dL (ref 6.4–8.2)

## 2021-05-10 LAB — CBC WITH AUTO DIFFERENTIAL
Basophils %: 1 % (ref 0–1)
Basophils %: 0 % (ref 0–1)
Basophils Absolute: 0.1 10*3/uL (ref 0.0–0.1)
Basophils Absolute: 0 10*3/uL (ref 0.0–0.1)
Eosinophils %: 3 % (ref 0–7)
Eosinophils %: 4 % (ref 0–7)
Eosinophils Absolute: 0.2 10*3/uL (ref 0.0–0.4)
Eosinophils Absolute: 0.2 10*3/uL (ref 0.0–0.4)
Granulocyte Absolute Count: 0 10*3/uL (ref 0.00–0.04)
Granulocyte Absolute Count: 0 10*3/uL (ref 0.00–0.04)
Hematocrit: 30.7 % — ABNORMAL LOW (ref 35.0–47.0)
Hematocrit: 29.6 % — ABNORMAL LOW (ref 35.0–47.0)
Hemoglobin: 9.3 g/dL — ABNORMAL LOW (ref 11.5–16.0)
Hemoglobin: 8.9 g/dL — ABNORMAL LOW (ref 11.5–16.0)
Immature Granulocytes: 0 % (ref 0.0–0.5)
Immature Granulocytes: 0 % (ref 0.0–0.5)
Lymphocytes %: 23 % (ref 12–49)
Lymphocytes %: 26 % (ref 12–49)
Lymphocytes Absolute: 1.3 10*3/uL (ref 0.8–3.5)
Lymphocytes Absolute: 1.3 10*3/uL (ref 0.8–3.5)
MCH: 28.6 PG (ref 26.0–34.0)
MCH: 28.8 PG (ref 26.0–34.0)
MCHC: 30.3 g/dL (ref 30.0–36.5)
MCHC: 30.1 g/dL (ref 30.0–36.5)
MCV: 94.5 FL (ref 80.0–99.0)
MCV: 95.8 FL (ref 80.0–99.0)
MPV: 11.2 FL (ref 8.9–12.9)
MPV: 11 FL (ref 8.9–12.9)
Monocytes %: 7 % (ref 5–13)
Monocytes %: 8 % (ref 5–13)
Monocytes Absolute: 0.4 10*3/uL (ref 0.0–1.0)
Monocytes Absolute: 0.4 10*3/uL (ref 0.0–1.0)
NRBC Absolute: 0 10*3/uL (ref 0.00–0.01)
NRBC Absolute: 0 10*3/uL (ref 0.00–0.01)
Neutrophils %: 66 % (ref 32–75)
Neutrophils %: 62 % (ref 32–75)
Neutrophils Absolute: 3.6 10*3/uL (ref 1.8–8.0)
Neutrophils Absolute: 3.1 10*3/uL (ref 1.8–8.0)
Nucleated RBCs: 0 PER 100 WBC
Nucleated RBCs: 0 PER 100 WBC
Platelets: 83 10*3/uL — ABNORMAL LOW (ref 150–400)
Platelets: 77 10*3/uL — ABNORMAL LOW (ref 150–400)
RBC: 3.25 M/uL — ABNORMAL LOW (ref 3.80–5.20)
RBC: 3.09 M/uL — ABNORMAL LOW (ref 3.80–5.20)
RDW: 16 % — ABNORMAL HIGH (ref 11.5–14.5)
RDW: 16.1 % — ABNORMAL HIGH (ref 11.5–14.5)
WBC: 5.6 10*3/uL (ref 3.6–11.0)
WBC: 5 10*3/uL (ref 3.6–11.0)

## 2021-05-10 LAB — ARTERIAL BLOOD GASES
Base Excess: 9 mmol/L
FIO2: 45 %
HCO3: 37 mmol/L — ABNORMAL HIGH (ref 22–26)
O2 Sat: 99 % — ABNORMAL HIGH (ref 92–97)
PCO2: 62 mmHg — ABNORMAL HIGH (ref 35–45)
PO2: 155 mmHg — ABNORMAL HIGH (ref 80–100)
Peep/Cpap: 12
pH: 7.39 (ref 7.35–7.45)

## 2021-05-10 LAB — POCT GLUCOSE
POC Glucose: 165 mg/dL — ABNORMAL HIGH (ref 65–117)
POC Glucose: 132 mg/dL — ABNORMAL HIGH (ref 65–117)
POC Glucose: 154 mg/dL — ABNORMAL HIGH (ref 65–117)

## 2021-05-10 LAB — TROPONIN, HIGH SENSITIVITY
Troponin, High Sensitivity: 82 ng/L — ABNORMAL HIGH (ref 0–51)
Troponin, High Sensitivity: 70 ng/L — ABNORMAL HIGH (ref 0–51)

## 2021-05-10 LAB — PROBNP, N-TERMINAL: BNP: 6505 PG/ML — ABNORMAL HIGH (ref <125)

## 2021-05-10 MED ORDER — MAGNESIUM SULFATE 2 GRAM/50 ML IVPB
2 gram/50 mL (4 %) | INTRAVENOUS | Status: AC
Start: 2021-05-10 — End: 2021-05-10
  Administered 2021-05-10 (×2): via INTRAVENOUS

## 2021-05-10 MED ORDER — ARFORMOTEROL 15 MCG/2 ML NEB SOLUTION
15 mcg/2 mL | Freq: Two times a day (BID) | RESPIRATORY_TRACT | Status: DC
Start: 2021-05-10 — End: 2021-05-10
  Administered 2021-05-10: 11:00:00 via RESPIRATORY_TRACT

## 2021-05-10 MED ORDER — CITALOPRAM 20 MG TAB
20 mg | Freq: Every day | ORAL | Status: AC
Start: 2021-05-10 — End: 2021-05-29
  Administered 2021-05-10 – 2021-05-29 (×20): via ORAL

## 2021-05-10 MED ORDER — BUMETANIDE 0.25 MG/ML IJ SOLN
0.25 mg/mL | Freq: Two times a day (BID) | INTRAMUSCULAR | Status: DC
Start: 2021-05-10 — End: 2021-05-16
  Administered 2021-05-10 – 2021-05-15 (×9): via INTRAVENOUS

## 2021-05-10 MED ORDER — GLUCAGON 1 MG INJECTION
1 mg | INTRAMUSCULAR | Status: AC | PRN
Start: 2021-05-10 — End: 2021-05-29

## 2021-05-10 MED ORDER — SUCRALFATE 1 GRAM TAB
1 gram | Freq: Three times a day (TID) | ORAL | Status: AC
Start: 2021-05-10 — End: 2021-05-29
  Administered 2021-05-10 – 2021-05-29 (×60): via ORAL

## 2021-05-10 MED ORDER — SODIUM CHLORIDE 0.9 % INJECTION
2 gram | INTRAMUSCULAR | Status: DC
Start: 2021-05-10 — End: 2021-05-20
  Administered 2021-05-10 – 2021-05-20 (×11): via INTRAVENOUS

## 2021-05-10 MED ORDER — ARFORMOTEROL 15 MCG/2 ML NEB SOLUTION
15 mcg/2 mL | Freq: Two times a day (BID) | RESPIRATORY_TRACT | Status: AC
Start: 2021-05-10 — End: 2021-05-29
  Administered 2021-05-10 – 2021-05-17 (×17): via RESPIRATORY_TRACT

## 2021-05-10 MED ORDER — MONTELUKAST 10 MG TAB
10 mg | Freq: Every evening | ORAL | Status: DC
Start: 2021-05-10 — End: 2021-05-29
  Administered 2021-05-10 – 2021-05-29 (×20): via ORAL

## 2021-05-10 MED ORDER — DEXTROSE 10% IN WATER (D10W) IV
10 % | INTRAVENOUS | Status: AC | PRN
Start: 2021-05-10 — End: 2021-05-29

## 2021-05-10 MED ORDER — ATORVASTATIN 10 MG TAB
10 mg | Freq: Every day | ORAL | Status: AC
Start: 2021-05-10 — End: 2021-05-29
  Administered 2021-05-10 – 2021-05-29 (×20): via ORAL

## 2021-05-10 MED ORDER — POLYETHYLENE GLYCOL 3350 17 GRAM (100 %) ORAL POWDER PACKET
17 gram | Freq: Every day | ORAL | Status: AC | PRN
Start: 2021-05-10 — End: 2021-05-29

## 2021-05-10 MED ORDER — HEPARIN (PORCINE) 5,000 UNIT/ML IJ SOLN
5000 unit/mL | Freq: Three times a day (TID) | INTRAMUSCULAR | Status: DC
Start: 2021-05-10 — End: 2021-05-18
  Administered 2021-05-10 – 2021-05-18 (×25): via SUBCUTANEOUS

## 2021-05-10 MED ORDER — ASPIRIN 81 MG CHEWABLE TAB
81 mg | Freq: Every day | ORAL | Status: DC
Start: 2021-05-10 — End: 2021-05-29
  Administered 2021-05-10 – 2021-05-29 (×20): via ORAL

## 2021-05-10 MED ORDER — BUDESONIDE 0.5 MG/2 ML NEB SUSPENSION
0.5 mg/2 mL | Freq: Two times a day (BID) | RESPIRATORY_TRACT | Status: DC
Start: 2021-05-10 — End: 2021-05-10
  Administered 2021-05-10: 11:00:00 via RESPIRATORY_TRACT

## 2021-05-10 MED ORDER — ACETAMINOPHEN 325 MG TABLET
325 mg | Freq: Four times a day (QID) | ORAL | Status: AC | PRN
Start: 2021-05-10 — End: 2021-05-29
  Administered 2021-05-11 – 2021-05-29 (×13): via ORAL

## 2021-05-10 MED ORDER — METOPROLOL SUCCINATE SR 25 MG 24 HR TAB
25 mg | Freq: Every day | ORAL | Status: DC
Start: 2021-05-10 — End: 2021-05-11
  Administered 2021-05-11: 13:00:00 via ORAL

## 2021-05-10 MED ORDER — GLIPIZIDE 5 MG TAB
5 mg | Freq: Two times a day (BID) | ORAL | Status: AC
Start: 2021-05-10 — End: 2021-05-29
  Administered 2021-05-10 – 2021-05-17 (×15): via ORAL

## 2021-05-10 MED ORDER — ACETAMINOPHEN 650 MG RECTAL SUPPOSITORY
650 mg | Freq: Four times a day (QID) | RECTAL | Status: AC | PRN
Start: 2021-05-10 — End: 2021-05-29

## 2021-05-10 MED ORDER — BUDESONIDE 0.5 MG/2 ML NEB SUSPENSION
0.5 mg/2 mL | Freq: Two times a day (BID) | RESPIRATORY_TRACT | Status: AC
Start: 2021-05-10 — End: 2021-05-29
  Administered 2021-05-10 – 2021-05-29 (×45): via RESPIRATORY_TRACT

## 2021-05-10 MED ORDER — SODIUM CHLORIDE 0.9 % IJ SYRG
Freq: Three times a day (TID) | INTRAMUSCULAR | Status: AC
Start: 2021-05-10 — End: 2021-05-29
  Administered 2021-05-10 – 2021-05-29 (×58): via INTRAVENOUS

## 2021-05-10 MED ORDER — ONDANSETRON (PF) 4 MG/2 ML INJECTION
4 mg/2 mL | Freq: Four times a day (QID) | INTRAMUSCULAR | Status: AC | PRN
Start: 2021-05-10 — End: 2021-05-29
  Administered 2021-05-14 – 2021-05-17 (×3): via INTRAVENOUS

## 2021-05-10 MED ORDER — SODIUM CHLORIDE 0.9 % IJ SYRG
INTRAMUSCULAR | Status: AC | PRN
Start: 2021-05-10 — End: 2021-05-29

## 2021-05-10 MED ORDER — POTASSIUM CHLORIDE SR 10 MEQ TAB
10 mEq | ORAL | Status: AC
Start: 2021-05-10 — End: 2021-05-10
  Administered 2021-05-10 (×3): via ORAL

## 2021-05-10 MED ORDER — BUMETANIDE 0.25 MG/ML IJ SOLN
0.25 mg/mL | Freq: Once | INTRAMUSCULAR | Status: AC
Start: 2021-05-10 — End: 2021-05-09
  Administered 2021-05-10: 01:00:00 via INTRAVENOUS

## 2021-05-10 MED ORDER — LEVOTHYROXINE 150 MCG TAB
150 mcg | Freq: Every day | ORAL | Status: AC
Start: 2021-05-10 — End: 2021-05-29
  Administered 2021-05-10 – 2021-05-29 (×19): via ORAL

## 2021-05-10 MED ORDER — GLUCOSE 4 GRAM CHEWABLE TAB
4 gram | ORAL | Status: AC | PRN
Start: 2021-05-10 — End: 2021-05-29

## 2021-05-10 MED ORDER — INSULIN LISPRO 100 UNIT/ML INJECTION
100 unit/mL | Freq: Four times a day (QID) | SUBCUTANEOUS | Status: AC
Start: 2021-05-10 — End: 2021-05-29
  Administered 2021-05-10 – 2021-05-29 (×37): via SUBCUTANEOUS

## 2021-05-10 MED ORDER — PROMETHAZINE 25 MG TAB
25 mg | Freq: Four times a day (QID) | ORAL | Status: AC | PRN
Start: 2021-05-10 — End: 2021-05-29

## 2021-05-10 MED ORDER — SENNOSIDES 8.6 MG TAB
8.6 mg | Freq: Every day | ORAL | Status: AC | PRN
Start: 2021-05-10 — End: 2021-05-29
  Administered 2021-05-19: 21:00:00 via ORAL

## 2021-05-10 MED ORDER — IPRATROPIUM-ALBUTEROL 2.5 MG-0.5 MG/3 ML NEB SOLUTION
2.5 mg-0.5 mg/3 ml | RESPIRATORY_TRACT | Status: AC | PRN
Start: 2021-05-10 — End: 2021-05-29
  Administered 2021-05-10 – 2021-05-16 (×3): via RESPIRATORY_TRACT

## 2021-05-10 MED FILL — ATORVASTATIN 20 MG TAB: 20 mg | ORAL | Qty: 1

## 2021-05-10 MED FILL — METOPROLOL SUCCINATE SR 25 MG 24 HR TAB: 25 mg | ORAL | Qty: 1

## 2021-05-10 MED FILL — MONTELUKAST 10 MG TAB: 10 mg | ORAL | Qty: 1

## 2021-05-10 MED FILL — IPRATROPIUM-ALBUTEROL 2.5 MG-0.5 MG/3 ML NEB SOLUTION: 2.5 mg-0.5 mg/3 ml | RESPIRATORY_TRACT | Qty: 3

## 2021-05-10 MED FILL — HEPARIN (PORCINE) 5,000 UNIT/ML IJ SOLN: 5000 unit/mL | INTRAMUSCULAR | Qty: 1

## 2021-05-10 MED FILL — MAGNESIUM SULFATE 2 GRAM/50 ML IVPB: 2 gram/50 mL (4 %) | INTRAVENOUS | Qty: 50

## 2021-05-10 MED FILL — ARFORMOTEROL 15 MCG/2 ML NEB SOLUTION: 15 mcg/2 mL | RESPIRATORY_TRACT | Qty: 2

## 2021-05-10 MED FILL — BUMETANIDE 0.25 MG/ML IJ SOLN: 0.25 mg/mL | INTRAMUSCULAR | Qty: 4

## 2021-05-10 MED FILL — POTASSIUM CHLORIDE SR 10 MEQ TAB: 10 mEq | ORAL | Qty: 4

## 2021-05-10 MED FILL — BUDESONIDE 0.5 MG/2 ML NEB SUSPENSION: 0.5 mg/2 mL | RESPIRATORY_TRACT | Qty: 1

## 2021-05-10 MED FILL — GLIPIZIDE 5 MG TAB: 5 mg | ORAL | Qty: 1

## 2021-05-10 MED FILL — CEFTRIAXONE 2 GRAM SOLUTION FOR INJECTION: 2 gram | INTRAMUSCULAR | Qty: 2

## 2021-05-10 MED FILL — LEVOTHYROXINE 50 MCG TAB: 50 mcg | ORAL | Qty: 3

## 2021-05-10 MED FILL — INSULIN LISPRO 100 UNIT/ML INJECTION: 100 unit/mL | SUBCUTANEOUS | Qty: 2

## 2021-05-10 MED FILL — CITALOPRAM 20 MG TAB: 20 mg | ORAL | Qty: 2

## 2021-05-10 MED FILL — BD POSIFLUSH NORMAL SALINE 0.9 % INJECTION SYRINGE: INTRAMUSCULAR | Qty: 40

## 2021-05-10 MED FILL — CARAFATE 1 GRAM TABLET: 1 gram | ORAL | Qty: 1

## 2021-05-10 MED FILL — ASPIRIN 81 MG CHEWABLE TAB: 81 mg | ORAL | Qty: 1

## 2021-05-10 NOTE — ED Notes (Signed)
RRT called for pt c/o difficulty breathing and feeling unwell.  Per primary RN, pt became pale.  Dr. Madelin Rear, primary RN, RRT RN, and charge at bedside.  POC BG 165, EKG obtained and reviewed by Dr. Madelin Rear.  No changes noted in EKG.  ABG ordered.  Pt placed back on CPAP and states feeling slightly better.

## 2021-05-10 NOTE — Consults (Signed)
See initial care note

## 2021-05-10 NOTE — Progress Notes (Signed)
Progress Notes by Vincent Peyer, NP at 05/10/21 0827                Author: Vincent Peyer, NP  Service: Cardiology  Author Type: Nurse Practitioner       Filed: 05/10/21 0838  Date of Service: 05/10/21 0827  Status: Attested           Editor: Vincent Peyer, NP (Nurse Practitioner)  Cosigner: Victorino December, MD at 05/10/21 1635          Attestation signed by Victorino December, MD at 05/10/21 1635          Cardiology Attending:      Patient personally seen and examined. All the elements of history and examination were personally performed. Assessment and plan was discussed and agree as written above.          Briefly      Deborah [name] is a 66 y.o. female with PMH of morbid obesity, PAF, IE of the atrial lead, AVR admitted with incresing SOB. Apparently has not received diuretics at SNF.             AAOx3   Chest - clear to auscultation, no wheezes, rales or rhonchi   Heart - normal rate, irregular rhythm, normal S1, S2, no murmurs, rubs, clicks or gallops   Abdomen - soft, nontender, nondistended, no masses or organomegaly   Extremities - peripheral pulses normal, 2+ pedal edema         Assessment:      - HFpEF   - PAF, PPM   - Respiratory failure   - DM   - DKA   - Atrial lead endocarditis         Plan:       - Diurese   - Antibiotics   - Pacing                ___________________________________________________      Rod Can. Dagoberto Reef, MD, [facility]                                                                                                                   [facility] CARDIOLOGY                     Cardiology Care Note       Initial Encounter      Follow-up        Patient Name: Deborah [name] - DOB:[birth year] - ZOX:[id number]   Primary Cardiologist: Sondra Barges Cardiology Physicians: Thurston Pounds MD   Consulting Cardiologist: Sondra Barges Cardiology Physicians: Drue Second MD        Reason for encounter: CHF       HPI:         Deborah [name] is a 66 y.o. female with PMH of HF p EF, a fib, possible atrial  lead vegetation treated abx, repeat ECHO with no evidence of vegetation with COPD, A/C resp failure who was recently discharged from hospital on  04/24/2021 to [facility] for rehab.       She says that she had not been receiving her Bumex on a consistent basis and missed several days dosing.       She started having a progressively worsening SOB associated with lower extremity swelling. She says she was using oxygen but required supplementation in the ED. She denies any cough or fever. No chills, nausea or vomiting.      Subjective:        Deborah [name] reports dyspnea          Assessment and Plan          1 A/C HF p EF: seems as if her diuretics were not being administered at [facility]. pBNP is improved and chest xray unchanged.  Cont IV loops.       2 hx a fib s/p PPM (not MRI compatible)/ s/p AVR      3. Bacteremia due to group B Streptococcus: during recent admission, she had GBS bacteremia complicated by pacemaker line vegetation. Continue IV Ceftriaxone scheduled to run through 05/23/21.       4. Acute on chronic respiratory failure with hypoxia and hypercapnia/COPD: On BI Pap        5. Hypokalemia : replete lytes        6 T 2 DM:        7 CKD:       8 obesity: Body mass index is 55.44 kg/m.                     ____________________________________________________________      Cardiac testing   04/08/21      ECHO ADULT FOLLOW-UP OR LIMITED 04/17/2021 04/17/2021      Interpretation Summary   ?  Aortic Valve: Not well visualized. Edwards bovine bioprosthetic valve with a size of 25 mm. Mild regurgitation.  Stenosis of the aortic valve. AV mean gradient is 38 mmHg. AV area by continuity VTI is 1.0 cm2.   ?  Technical qualifiers: Echo study was technically difficult due to patient's body habitus.      Signed by: Candyce Churn, DO on 04/17/2021  5:00 PM         02/16/21      NUCLEAR CARDIAC STRESS TEST 02/20/2021 02/26/2021      Interpretation Summary   ?  Nuclear Findings: LV perfusion is normal. There is no evidence of  inducible ischemia.   ?  Nuclear Findings: The defect appears to be caused by breast attenuation.   ?  ECG: Resting ECG demonstrates normal sinus rhythm.   ?  ECG: Stress ECG was negative for ischemia.   ?  Stress Test: A pharmacological stress test was performed using lexiscan. Hemodynamics are adequate for diagnosis. Blood pressure demonstrated  a normal response and heart rate demonstrated a normal response to stress. The patient's heart rate recovery was normal. The patient reported dyspnea and no chest pain during the stress test.      Ventricular bigeminy   Unifocal PVC, RBBB and superior axis PVCs      Signed by: Thurston Pounds, MD on 02/20/2021  5:00 PM               Most recent HS troponins:     Recent Labs           05/09/21   2014        TROPHS  82*           ECG:  EKG Results                  Procedure  720  Value  Units  Date/Time           EKG 12 LEAD INITIAL [[id number]]         Order Status: Sent         EKG 12 LEAD INITIAL [[id number]]  Collected: 05/09/21 2016       Order Status: Completed  Updated: 05/09/21 2016                Ventricular Rate  97  BPM           Atrial Rate  49  BPM           P-R Interval  160  ms           QRS Duration  160  ms           Q-T Interval  350  ms           QTC Calculation (Bezet)  444  ms           Calculated P Axis  67  degrees           Calculated R Axis  -117  degrees           Calculated T Axis  6  degrees                Diagnosis  --             Sinus bradycardia with marked sinus arrhythmia with frequent atrial-paced    complexes and with frequent and consecutive premature ventricular complexes   Right bundle branch block   Possible Lateral infarct , age undetermined   Abnormal ECG   When compared with ECG of 13-Apr-2021 22:14,   premature ventricular complexes are now present   premature atrial complexes are no longer present   Borderline criteria for Lateral infarct are now present                           Review of Systems:      All other systems reviewed  and all negative except as written in HPI       Patient unable to provide secondary  to condition        Past Medical History:        Diagnosis  Date         ?  (HFpEF) heart failure with preserved ejection fraction (HCC)       ?  Anxiety and depression       ?  Aortic valve replaced            S/p bovine aortic valve replacement.         ?  Asthma       ?  Chronic narcotic use       ?  Chronic obstructive pulmonary disease (HCC)       ?  Chronic pain       ?  CKD (chronic kidney disease), stage III (HCC)            Baseline creatinine is 1.3-1.4 with GFR in the 40s.         ?  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  GERD (gastroesophageal reflux disease)       ?  History of vascular access  device  04/13/2021          4 FR Single PICC for LTABX: R cephalic vessell length 48 CM Max P leave @ 1 CM out; Arm circumferenc 40 CM         ?  Hyperlipidemia       ?  Hypothyroidism       ?  Morbid obesity (HCC)       ?  Neuropathy       ?  Obstructive sleep apnea           ?  Rhinitis            Past Surgical History:         Procedure  Laterality  Date          ?  COLONOSCOPY  N/A  12/01/2020          COLONOSCOPY performed by Ian Malkin, MD at [facility] ENDOSCOPY          ?  HX AORTIC VALVE REPLACEMENT              Bovine bioprosthetic          ?  HX PACEMAKER            Social Hx:  reports that she quit smoking about 11 years ago. Her smoking use included cigarettes. She has a 40.00 pack-year smoking history. She has never used smokeless tobacco. She reports that she does not currently use alcohol. She reports that she  does not use drugs.   Family Hx: family history includes Hypertension in her father and mother.     Allergies        Allergen  Reactions         ?  Nitroglycerin  Unknown (comments)             hypotension         ?  Aloe Vera  Rash     ?  Hydrochlorothiazide  Other (comments)             Reports 'kidneys dry up"          ?  Tetanus And Diphther. Tox (Pf)  Swelling             Swelling of arm and it  turns black              OBJECTIVE:     Wt Readings from Last 3 Encounters:        05/09/21  (!) 160.6 kg (354 lb)     04/24/21  (!) 160.8 kg (354 lb 8 oz)        03/09/21  149.7 kg (330 lb)           Intake/Output Summary (Last 24 hours) at 05/10/2021 0827   Last data filed at 05/10/2021 0824     Gross per 24 hour        Intake  --        Output  900 ml        Net  -900 ml           Physical Exam:      Vitals:      Vitals:             05/10/21 0637  05/10/21 0700  05/10/21 0701  05/10/21 0703           BP:  (!) 109/57  (!) 124/98         Pulse:  85  (!)  122  (!) 120       Resp:  18  22         Temp:             SpO2:    99%    100%     Weight:                   Height:                Telemetry: SR       Gen: Morbidly obese, on Bi Pap    Neck: Supple, No JVD, No Carotid Bruit   Resp: No accessory muscle use, Clear breath sounds, No rales or rhonchi   Card: Regular Rate,Rythm, Normal S1, S2, No murmurs, rubs or gallop.    Abd:   obese, Soft, non-tender, non-distended, BS+    MSK: No cyanosis   Skin: No rashes     Neuro: Moving all four extremities, follows commands appropriately   Psych: Fair insight, oriented to person, place, alert, Nml Affect   LE: 3+  edema      Data Review:       Radiology:    XR Results (most recent):   Results from Hospital Encounter encounter on 05/09/21      XR CHEST PORT      Narrative   EXAM:  XR CHEST PORT      INDICATION: Short of breath      COMPARISON: 04/16/2021      TECHNIQUE: portable chest AP view      FINDINGS: A right-sided PICC line is in place. A left subclavian pacemaker is in   place. The patient status post median sternotomy. The heart is moderately   enlarged, but unchanged. There is unchanged diffuse interstitial prominence.. No   pleural effusion or pneumothorax.      Impression   Unchanged moderate cardiomegaly. Unchanged diffuse interstitial   prominence which may be related to edema or atypical (viral) infection.           Recent Labs            05/10/21   0237   05/09/21   2014     NA  137  140     K  3.2*  3.3*     CL  98  99     CO2  39*  39*     BUN  17  18     CREA  1.22*  1.36*     GLU  119*  174*         CA  8.7  9.1          Recent Labs            05/10/21   0237  05/09/21   2014     WBC  5.0  5.6     HGB  8.9*  9.3*     HCT  29.6*  30.7*         PLT  77*  83*          Recent Labs            05/10/21   0237  05/09/21   2014         AP  60  61        No results for input(s): CHOL, LDLC in the last 72 hours.      No lab exists for component: TGL, HDLC,  HBA1C  Current meds:      Current Facility-Administered Medications:    ?  potassium chloride SR (KLOR-CON 10) tablet 40 mEq, 40 mEq, Oral, Q4H, Melynda Kellermuria, Robert O, MD   ?  albuterol-ipratropium (DUO-NEB) 2.5 MG-0.5 MG/3 ML, 3 mL, Nebulization, Q4H PRN, Jamil, Nora, DO, 3 mL at 05/10/21 0700   ?  sodium chloride (NS) flush 5-40 mL, 5-40 mL, IntraVENous, Q8H, Melynda Kellermuria, Robert O, MD, 10 mL at 05/10/21 0641   ?  sodium chloride (NS) flush 5-40 mL, 5-40 mL, IntraVENous, PRN, Melynda Kellermuria, Robert O, MD   ?  acetaminophen (TYLENOL) tablet 650 mg, 650 mg, Oral, Q6H PRN **OR** acetaminophen (TYLENOL) suppository 650 mg, 650 mg, Rectal, Q6H PRN, Melynda Kellermuria, Robert O, MD   ?  polyethylene glycol (MIRALAX) packet 17 g, 17 g, Oral, DAILY PRN, Melynda Kellermuria, Robert O, MD   ?  senna (SENOKOT) tablet 8.6 mg, 1 Tablet, Oral, DAILY PRN, Melynda Kellermuria, Robert O, MD   ?  promethazine (PHENERGAN) tablet 12.5 mg, 12.5 mg, Oral, Q6H PRN **OR** ondansetron (ZOFRAN) injection 4 mg, 4 mg, IntraVENous, Q6H PRN, Melynda Kellermuria, Robert O, MD   ?  bumetanide (BUMEX) injection 1 mg, 1 mg, IntraVENous, BID, Melynda Kellermuria, Robert O, MD, 1 mg at 05/10/21 0641   ?  cefTRIAXone (ROCEPHIN) 2 g in 0.9% sodium chloride 20 mL IV syringe, 2 g, IntraVENous, Q24H, Melynda Kellermuria, Robert O, MD   ?  arformoteroL (BROVANA) neb solution 15 mcg, 15 mcg, Nebulization, BID, Melynda Kellermuria, Robert O, MD, 15 mcg at 05/10/21 0706   ?  aspirin chewable tablet 81 mg, 81 mg, Oral, DAILY, Melynda Kellermuria, Robert O, MD   ?   atorvastatin (LIPITOR) tablet 10 mg, 10 mg, Oral, DAILY, Melynda Kellermuria, Robert O, MD   ?  budesonide (PULMICORT) 500 mcg/2 ml nebulizer suspension, 500 mcg, Nebulization, BID, Melynda Kellermuria, Robert O, MD, 500 mcg at 05/10/21 [id number]   ?  citalopram (CELEXA) tablet 40 mg, 40 mg, Oral, DAILY, Melynda Kellermuria, Robert O, MD   ?  .PHARMACY TO SUBSTITUTE PER PROTOCOL (Reordered from: glimepiride (AMARYL) 2 mg tablet), , , Per Protocol, Melynda Kellermuria, Robert O, MD   ?  levothyroxine (SYNTHROID) tablet 150 mcg, 150 mcg, Oral, ACB, Melynda Kellermuria, Robert O, MD   ?  metoprolol succinate (TOPROL-XL) XL tablet 25 mg, 25 mg, Oral, DAILY, Melynda Kellermuria, Robert O, MD   ?  montelukast (SINGULAIR) tablet 10 mg, 10 mg, Oral, QHS, Melynda Kellermuria, Robert O, MD, 10 mg at 05/09/21 2336   ?  sucralfate (CARAFATE) tablet 1 g, 1 g, Oral, TID, Melynda Kellermuria, Robert O, MD, 1 g at 05/09/21 2336      Current Outpatient Medications:    ?  bumetanide (BUMEX) 1 mg tablet, Take 1 Tablet by mouth daily., Disp: 30 Tablet, Rfl: 0   ?  lidocaine 4 % patch, Apply to low back Apply patch to the affected area for 12 hours a day and remove for 12 hours a day., Disp: 4 Patch, Rfl: 0   ?  arformoteroL (BROVANA) 15 mcg/2 mL nebu neb solution, 2 mL by Nebulization route two (2) times a day., Disp: 120 mL, Rfl: 0   ?  budesonide (PULMICORT) 0.5 mg/2 mL nbsp, 2 mL by Nebulization route two (2) times a day., Disp: 120 mL, Rfl: 0   ?  cefTRIAXone 2 gram 2 g IV syringe, 2 g by IntraVENous route every twenty-four (24) hours for 29 days., Disp: 29 Dose, Rfl: 0   ?  miconazole (MICOTIN) 2 % topical powder, Apply  to affected area two (2)  times a day. APPLY TO bilateral breasts  Nursing, document site in comments, Disp: 1 g, Rfl: 0   ?  polyethylene glycol (MIRALAX) 17 gram packet, Take 1 Packet by mouth daily as needed for Constipation., Disp: 30 Packet, Rfl: 0   ?  naloxone (Narcan) 4 mg/actuation nasal spray, Use 1 spray intranasally, then discard. Repeat with new spray every 2 min as needed for opioid overdose symptoms,  alternating nostrils., Disp: 2 Each, Rfl: 0   ?  atorvastatin (LIPITOR) 10 mg tablet, Take 10 mg by mouth daily., Disp: , Rfl:    ?  metoprolol succinate (TOPROL-XL) 25 mg XL tablet, Take 1 Tablet by mouth daily., Disp: 60 Tablet, Rfl: 2   ?  levothyroxine (SYNTHROID) 150 mcg tablet, Take 1 Tablet by mouth Daily (before breakfast)., Disp: 30 Tablet, Rfl: 1   ?  aspirin 81 mg chewable tablet, Take 1 Tablet by mouth daily., Disp: 30 Tablet, Rfl: 0   ?  acetaminophen (TYLENOL) 500 mg tablet, Take 1,000 mg by mouth every six (6) hours as needed for Pain., Disp: , Rfl:    ?  albuterol (PROVENTIL HFA, VENTOLIN HFA, PROAIR HFA) 90 mcg/actuation inhaler, Take 2 Puffs by inhalation every six (6) hours as needed., Disp: , Rfl:    ?  citalopram (CELEXA) 40 mg tablet, Take 40 mg by mouth daily., Disp: , Rfl:    ?  fluticasone propionate (FLONASE) 50 mcg/actuation nasal spray, 2 Sprays by Nasal route daily as needed., Disp: , Rfl:    ?  sucralfate (CARAFATE) 1 gram tablet, Take 1 g by mouth three (3) times daily., Disp: , Rfl:    ?  traZODone (DESYREL) 100 mg tablet, Take 100 mg by mouth nightly., Disp: , Rfl:    ?  glimepiride (AMARYL) 2 mg tablet, Take 2 mg by mouth two (2) times a day., Disp: , Rfl:    ?  montelukast (SINGULAIR) 10 mg tablet, Take 10 mg by mouth nightly., Disp: , Rfl:       Vincent Peyer, NP      [facility] Cardiology   Call center: (P) [phone]  (F) [phone]         CC:Barrett, Lance Sell, NP

## 2021-05-10 NOTE — Progress Notes (Signed)
Care Management Readmission Assessment        NAME:   Deborah [name]   DOB:     1955-10-23   MRN:     [id number]             RUR Score/Risk Level:       RUR:  27%  Risk Level:  High    Assessment:  In person with patient    Reason for Readmission:  Deborah [name] is a 66 y.o. female with history that includes anxiety, depression, DM, COPD, CKD, CHF, HLD, hypothyroidism, and GERD  who was emergently admitted for:  acute on chronic HFpEF.    Patient Active Problem List   Diagnosis Code    Arm paresthesia, left R20.2    Thrombocytopenia (HCC) D69.6    Morbid obesity (HCC) E66.01    CKD (chronic kidney disease), stage III (HCC) N18.30    Aortic valve replaced Z95.2    Pacemaker Z95.0    Hypothyroidism E03.9    Asthma J45.909    Anxiety and depression F41.9, F32.A    DM (diabetes mellitus), type 2 with complications (HCC) X52.8    Chronic narcotic use F11.90    Chronic pain G89.29    Rhinitis J31.0    Hyperlipidemia E78.5    Neuropathy G62.9    GERD (gastroesophageal reflux disease) K21.9    CHF (congestive heart failure) (HCC) I50.9    Elevated bilirubin R17    Liver cirrhosis (HCC) K74.60    Colitis K52.9    Acute exacerbation of CHF (congestive heart failure) (HCC) I50.9    Acute on chronic diastolic (congestive) heart failure (HCC) I50.33    PVC (premature ventricular contraction) I49.3    Systemic inflammatory response syndrome (SIRS) (HCC) R65.10    Acute on chronic heart failure with preserved ejection fraction (HFpEF) (HCC) I50.33    Bacteremia due to group B Streptococcus R78.81, B95.1    Chronic respiratory failure with hypoxia (HCC) J96.11    BMI 50.0-59.9, adult (HCC) Z68.43    Hypokalemia due to loss of potassium E87.6    Acute on chronic respiratory failure with hypoxia and hypercapnia (HCC) J96.21, J96.22    OSA (obstructive sleep apnea) G47.33    COPD (chronic obstructive pulmonary disease) (HCC) J44.9    History of PSVT (paroxysmal supraventricular tachycardia) Z86.79    Anemia D64.9    Hypomagnesemia E83.42        Has any pertinent information changed since previous Care Management Assessment?   Living arrangements:   Pt was at [facility] for SNF rehab. Pt does not want to return.    ADLs:   Pt requires assistance with ADLs.   DME:    w/c, rolling walker, cane, shower chair, and CPAP   Pharmacy:   [facility] [place]    Insurer:  Payor: [employer]ARP MEDICARE COMPLETE / Plan: [place] / Product Type: Managed Care Medicare /     PCP: Karilyn Cota, NP   Name of Practice:   [facility]   Current patient: Yes   Approximate date of last visit: unknown   Access to virtual PCP visits:  Unknown    Is a Care Conference indicated:   No    Did you attend your follow up appointment(s):   Pt saw MD at SNF  If not, why not:  N/A    Resources/supports as identified by patient/family:  Pt has an appropriate support system         Top Challenges facing patient (as identified  by patient/family and CM):       Finances/Medication cost?  None identified  Transportation?  None identified       Support system or lack thereof?  None identified     Living arrangements?  None identified         Self-care/ADLs/Cognition?  None identified       Advance Directive:  Full Code, does not have an advance directive.            Plan for utilizing home health:   TBD             Transition of Care Plan:      Likely SNF    Additional information:  Pt admitted on 05/09/21 for acute on chronic HFpEF. Pt was discharged on 04/24/21 to [facility] for rehab. CM met with Pt. Pt confirms she was still at [facility] for rehab. Pt reports she does not want to return upon discharge. Pt is agreeable to considering SNF at another facility.     When medically stable, Pt will need PT/OT evals as insurance will require authorization.      CM will continue to follow.     _____________________________________  Alejandro Mulling, BSW - Care Management  05/10/2021   10:02 AM     Readmission Assessment  Number of days since last admission?: 8-30  days  Previous disposition: SNF  Who is being interviewed?: Patient  What was the patient's/caregiver's perception as to why they think they needed to return back to the hospital?: Other (Comment) (weight gain/fluid retention)  Did you visit your Primary Care Physician after you left the hospital, before you returned this time?: Yes  Did you see a specialist, such as Cardiac, Pulmonary, Orthopedic Physician, etc. after you left the hospital?: No  Who advised the patient to return to the hospital?: Skilled Unit  Does the patient report anything that got in the way of taking their medications?: No  In our efforts to provide the best possible care to you and others like you, can you think of anything that we could have done to help you after you left the hospital the first time, so that you might not have needed to return so soon?: Other (Comment) (no)        Care Management Interventions  PCP Verified by CM: Yes [facility])  Mode of Transport at Discharge: BLS  Transition of Care Consult (CM Consult): Discharge Planning  MyChart Signup: No  Discharge Durable Medical Equipment: No  Physical Therapy Consult: No  Occupational Therapy Consult: No  Speech Therapy Consult: No  Support Systems: Child(ren)  Confirm Follow Up Transport: Family  Discharge Location  Patient Expects to be Discharged to:: Skilled nursing facility

## 2021-05-10 NOTE — Progress Notes (Signed)
[facility]  [address] [place] [place], [place] [address]  [phone]      Medical Progress Note      NAME: Deborah [name]   DOB:  1955-08-30  MRM:  [id number]    Date/Time of service: 05/10/2021       Subjective:     Chief Complaint:  Patient was personally seen and examined by me during this time period.  Chart reviewed. Fu dyspnea. Endorses orthopnea, LE swelling.        Objective:       Vitals:       Last 24hrs VS reviewed since prior progress note. Most recent are:    Visit Vitals  BP 117/62 (BP 1 Location: Right lower arm, BP Patient Position: Sitting)   Pulse 91   Temp 98.3 F (36.8 C)   Resp 20   Ht 5\' 7"  (1.702 m)   Wt (!) 160.6 kg (354 lb)   SpO2 94%   BMI 55.44 kg/m     SpO2 Readings from Last 6 Encounters:   05/10/21 94%   04/24/21 97%   03/09/21 96%   01/26/21 90%   12/03/20 96%   11/05/20 92%          Intake/Output Summary (Last 24 hours) at 05/10/2021 0843  Last data filed at 05/10/2021 0824  Gross per 24 hour   Intake --   Output 900 ml   Net -900 ml        Exam:     Physical Exam:    General: NAD   Eyes: Pink conjunctivae, PERRLA with no discharge. Normal eye movements  Ear, Nose, Mouth & Throat: No ottorrhea, rhinorrhea, non tender sinuses, dry mucous membranes  Respiratory:  No accessory muscle use, decreased breath sounds with crackles  Cardiovascular:  No JVD or murmurs, regular and normal S1, S2 without thrills, bruits. B/l peripheral edema.   GI & GU:  Soft abdomen, obese, non tender, normoactive bowel sounds with no palpable organomegaly  Heme:  No cervical or axillary adenopathy.   Musculoskeletal:  No cyanosis, clubbing, atrophy or deformities  Skin:  No rashes, bruising or ulcers   Neurological: Awake and alert, speech is clear, CNs 2-12 are grossly intact and otherwise non focal  Psychiatric:  fair insight       Medications Reviewed: (see below)    Lab Data Reviewed: (see  below)    ______________________________________________________________________    Medications:     Current Facility-Administered Medications   Medication Dose Route Frequency    potassium chloride SR (KLOR-CON 10) tablet 40 mEq  40 mEq Oral Q4H    albuterol-ipratropium (DUO-NEB) 2.5 MG-0.5 MG/3 ML  3 mL Nebulization Q4H PRN    sodium chloride (NS) flush 5-40 mL  5-40 mL IntraVENous Q8H    sodium chloride (NS) flush 5-40 mL  5-40 mL IntraVENous PRN    acetaminophen (TYLENOL) tablet 650 mg  650 mg Oral Q6H PRN    Or    acetaminophen (TYLENOL) suppository 650 mg  650 mg Rectal Q6H PRN    polyethylene glycol (MIRALAX) packet 17 g  17 g Oral DAILY PRN    senna (SENOKOT) tablet 8.6 mg  1 Tablet Oral DAILY PRN    promethazine (PHENERGAN) tablet 12.5 mg  12.5 mg Oral Q6H PRN    Or    ondansetron (ZOFRAN) injection 4 mg  4 mg IntraVENous Q6H PRN    bumetanide (BUMEX) injection 1 mg  1 mg IntraVENous BID  cefTRIAXone (ROCEPHIN) 2 g in 0.9% sodium chloride 20 mL IV syringe  2 g IntraVENous Q24H    arformoteroL (BROVANA) neb solution 15 mcg  15 mcg Nebulization BID    aspirin chewable tablet 81 mg  81 mg Oral DAILY    atorvastatin (LIPITOR) tablet 10 mg  10 mg Oral DAILY    budesonide (PULMICORT) 500 mcg/2 ml nebulizer suspension  500 mcg Nebulization BID    citalopram (CELEXA) tablet 40 mg  40 mg Oral DAILY    .PHARMACY TO SUBSTITUTE PER PROTOCOL (Reordered from: glimepiride (AMARYL) 2 mg tablet)    Per Protocol    levothyroxine (SYNTHROID) tablet 150 mcg  150 mcg Oral ACB    metoprolol succinate (TOPROL-XL) XL tablet 25 mg  25 mg Oral DAILY    montelukast (SINGULAIR) tablet 10 mg  10 mg Oral QHS    sucralfate (CARAFATE) tablet 1 g  1 g Oral TID     Current Outpatient Medications   Medication Sig    bumetanide (BUMEX) 1 mg tablet Take 1 Tablet by mouth daily.    lidocaine 4 % patch Apply to low back  Apply patch to the affected area for 12 hours a day and remove for 12 hours a day.    arformoteroL (BROVANA) 15 mcg/2 mL  nebu neb solution 2 mL by Nebulization route two (2) times a day.    budesonide (PULMICORT) 0.5 mg/2 mL nbsp 2 mL by Nebulization route two (2) times a day.    cefTRIAXone 2 gram 2 g IV syringe 2 g by IntraVENous route every twenty-four (24) hours for 29 days.    miconazole (MICOTIN) 2 % topical powder Apply  to affected area two (2) times a day. APPLY TO bilateral breasts    Nursing, document site in comments    polyethylene glycol (MIRALAX) 17 gram packet Take 1 Packet by mouth daily as needed for Constipation.    naloxone (Narcan) 4 mg/actuation nasal spray Use 1 spray intranasally, then discard. Repeat with new spray every 2 min as needed for opioid overdose symptoms, alternating nostrils.    atorvastatin (LIPITOR) 10 mg tablet Take 10 mg by mouth daily.    metoprolol succinate (TOPROL-XL) 25 mg XL tablet Take 1 Tablet by mouth daily.    levothyroxine (SYNTHROID) 150 mcg tablet Take 1 Tablet by mouth Daily (before breakfast).    aspirin 81 mg chewable tablet Take 1 Tablet by mouth daily.    acetaminophen (TYLENOL) 500 mg tablet Take 1,000 mg by mouth every six (6) hours as needed for Pain.    albuterol (PROVENTIL HFA, VENTOLIN HFA, PROAIR HFA) 90 mcg/actuation inhaler Take 2 Puffs by inhalation every six (6) hours as needed.    citalopram (CELEXA) 40 mg tablet Take 40 mg by mouth daily.    fluticasone propionate (FLONASE) 50 mcg/actuation nasal spray 2 Sprays by Nasal route daily as needed.    sucralfate (CARAFATE) 1 gram tablet Take 1 g by mouth three (3) times daily.    traZODone (DESYREL) 100 mg tablet Take 100 mg by mouth nightly.    glimepiride (AMARYL) 2 mg tablet Take 2 mg by mouth two (2) times a day.    montelukast (SINGULAIR) 10 mg tablet Take 10 mg by mouth nightly.          Lab Review:     Recent Labs     05/10/21  0237 05/09/21  2014   WBC 5.0 5.6   HGB 8.9* 9.3*   HCT 29.6* 30.7*  PLT 77* 83*     Recent Labs     05/10/21  0237 05/09/21  2014   NA 137 140   K 3.2* 3.3*   CL 98 99   CO2 39* 39*    GLU 119* 174*   BUN 17 18   CREA 1.22* 1.36*   CA 8.7 9.1   MG  --  1.4*   ALB 2.8* 2.9*   TBILI 1.5* 1.4*   ALT 24 23     Lab Results   Component Value Date/Time    Glucose (POC) 165 (H) 05/10/2021 06:32 AM    Glucose (POC) 265 (H) 04/24/2021 11:09 AM    Glucose (POC) 143 (H) 04/24/2021 07:23 AM    Glucose (POC) 197 (H) 04/23/2021 08:46 PM    Glucose (POC) 184 (H) 04/23/2021 04:00 PM          Assessment / Plan:     Acute on chronic heart failure with preserved ejection fraction (HFpEF) (HCC) (05/09/2021) POA: likely due to missed bumex doses. CXR with e/o of diffuse edema; she also notes orthopnea and LE swelling. Echo 03/2021 showed an EF of 50-55%. IV Bumex. Fluid restriction and Low sodium diet. Monitor Is/Os. Consult cardiology her other ailments     Acute on chronic hypercapnia resp failure/ HX of COPD (HCC) (05/09/2021) POA: not in any exacerbation. Continue nebs with Brovana, Pulmicort.  continue bipap prn and at night. Consider pulm consult     Hypokalemia due to loss of potassium /  Hypomagnesemia (05/09/2021) POA: replenish and monitor lytes      Thrombocytopenia (HCC) (04/30/2020) POA: unclear etiology. Chronic. Prior CT scans showed hepatic steatosis. Monitor for now; stop aspirin and sub heparin if falls below <50k     CKD (chronic kidney disease), stage III (HCC) (04/30/2020) POA: monitor renal function with diuresis     Bacteremia due to group B Streptococcus (05/09/2021) POA: during recent admission, she had GBS bacteremia complicated by pacemaker line vegetation. Seen by cardiology and ID. Continue IV Ceftriaxone scheduled to run through 05/23/21.    Pacemaker / Aortic valve replaced / History of PSVT (paroxysmal supraventricular tachycardia) (05/09/2021) POA: she has a hx of a bioprosthetic AVR. Her pacer is not MRi compatible. Monitor      Anemia (05/09/2021) POA: likely due to chronic illness. Monitor    Hypothyroidism PO: TSH 0.76. Continue Levothyroxine    DM (diabetes mellitus), type 2 with  complications POA: last A1c 5.7. Monitor blood glucose. continue Amaryl. ISS     BMI 50.0-59.9, adult (HCC) (05/09/2021) /  OSA (obstructive sleep apnea) (05/09/2021) POA: c/w BiPAP at night     Anxiety and depression POA: c/w Celexa.    Total time spent with patient: 45 **I personally saw and examined the patient during this time period**                 Care Plan discussed with: Patient and Nursing Staff    Discussed:  Care Plan    Prophylaxis:  Hep SQ    Disposition:  SNF/LTC           ___________________________________________________    Attending Physician: Horald Chestnut, DO

## 2021-05-10 NOTE — ED Notes (Signed)
TRANSFER - OUT REPORT:    Verbal report given to Lauren(name) on Deborah [name]  being transferred to 321(unit) for routine progression of care       Report consisted of patient's Situation, Background, Assessment and   Recommendations(SBAR).     Information from the following report(s) SBAR, ED Summary, Intake/Output, MAR, and Recent Results was reviewed with the receiving nurse.    Lines:   PICC Single Lumen 04/13/21 Right;Cephalic (Active)       Peripheral IV 05/09/21 Left Antecubital (Active)        Opportunity for questions and clarification was provided.      Patient transported with:   Monitor

## 2021-05-10 NOTE — Progress Notes (Signed)
Rapid Called at 0630    Responded to RRT at [facility] for feeling dizzy like she was going to pass out. RT at bedside and placed patient back on CPAP with some improvement. EKG obtained and NP notified. ABG ordered with results given to NP- will switch CPAP to BiPAP and upgrade to stepdwon    Provider at bedside: yes- NP    Interventions ordered: EKG and ABG    Sepsis Suspected: no    Transfer to Higher Level of Care: Yes- Stepdown    Blood Glucose: 165     Visit Vitals  BP 110/75   Pulse 81   Temp 98.3 F (36.8 C)   Resp 28   Ht 5\' 7"  (1.702 m)   Wt (!) 160.6 kg (354 lb)   SpO2 96%   BMI 55.44 kg/m        Rapid Ended at 0645    RRT RN assisted with transport to accepting unit N/A.    , RN

## 2021-05-10 NOTE — ED Notes (Signed)
Transfer of nursing care report given to [place], Charity fundraiser.

## 2021-05-10 NOTE — Care Coordination-Inpatient (Signed)
Chart reviewed by Heart Failure Nurse Navigator.  Heart Failure database completed.     Patient came to ED for increasing shortness of breath from [place] at [facility] and is admitted with acute on chronic HFpEF.  She had recent admission from 04/08/21 to 04/24/21 for septic shock with bacteremia and diabetic foot infection with discharged to skilled nursing facility for rehab.  Patient has PMH of COPD, ashtma, anxiety, depression, DM, CKD III, HLD, GERD, OSA, aortic valve replacement, s/p PPM, and hypothyroidism.    EF:  50 to 55% with mildly increased wall thickness, indeterminate diastolic function      ACEi/ARB/ARNi: Not indicated    BB: Metoprolol succinate 25 mg daily    Aldosterone Antagonist: Not indicated    Obstructive Sleep Apnea Screening: OSA diagnosis with home BiPAP       CRT Not indicated     NYHA Functional Class **.      Heart Failure Teach Back in Patient Education.    Heart Failure Avoiding Triggers on Discharge Instructions.      Cardiologist: Dr Loma Newton    Post discharge follow up phone call to be made within 48-72 hours of discharge.

## 2021-05-10 NOTE — Progress Notes (Signed)
Patient stated that she felt lighted and dizzy;  "I feel like I am going to pass out". Rapid called.

## 2021-05-10 NOTE — Progress Notes (Signed)
Bedside and Verbal shift change report given to Sara,RN (oncoming nurse) by Christ Kick (offgoing nurse). Report included the following information SBAR and Kardex.

## 2021-05-10 NOTE — ED Notes (Signed)
Report received from [place], [place].     Pt on BiPAP   Pacemaker leads infections in rehab for antibiotics  Pt sleeps with cpap  Co2 elevated  Purewick on pt at this time  EF 50  Goal O2 >90  Fluid restriction  Cellulitis looking skin on bilateral skin  Son wants dr to call him

## 2021-05-10 NOTE — Progress Notes (Signed)
1600: TRANSFER - IN REPORT:    Verbal report received from Sarah RN(name) on Deborah [name]  being received from ED(unit) for routine progression of care      Report consisted of patient's Situation, Background, Assessment and   Recommendations(SBAR).     Information from the following report(s) SBAR, Kardex, Accordion, and Cardiac Rhythm NSR  was reviewed with the receiving nurse.    Opportunity for questions and clarification was provided.      Assessment completed upon patient's arrival to unit and care assumed.

## 2021-05-11 ENCOUNTER — Inpatient Hospital Stay

## 2021-05-11 LAB — GLUCOSE, POC
Glucose (POC): 130 mg/dL — ABNORMAL HIGH (ref 65–117)
Glucose (POC): 124 mg/dL — ABNORMAL HIGH (ref 65–117)
Glucose (POC): 142 mg/dL — ABNORMAL HIGH (ref 65–117)
Glucose (POC): 112 mg/dL (ref 65–117)

## 2021-05-11 LAB — EKG, 12 LEAD, INITIAL
Atrial Rate: 49 {beats}/min
Calculated P Axis: 67 degrees
Calculated R Axis: -117 degrees
Calculated T Axis: 6 degrees
P-R Interval: 160 ms
Q-T Interval: 350 ms
QRS Duration: 160 ms
QTC Calculation (Bezet): 444 ms
Ventricular Rate: 97 {beats}/min

## 2021-05-11 LAB — CBC WITH AUTOMATED DIFF
ABS. BASOPHILS: 0.1 10*3/uL (ref 0.0–0.1)
ABS. EOSINOPHILS: 0.2 10*3/uL (ref 0.0–0.4)
ABS. IMM. GRANS.: 0 10*3/uL (ref 0.00–0.04)
ABS. LYMPHOCYTES: 2 10*3/uL (ref 0.8–3.5)
ABS. MONOCYTES: 0.6 10*3/uL (ref 0.0–1.0)
ABS. NEUTROPHILS: 4.6 10*3/uL (ref 1.8–8.0)
ABSOLUTE NRBC: 0 10*3/uL (ref 0.00–0.01)
BASOPHILS: 1 % (ref 0–1)
EOSINOPHILS: 3 % (ref 0–7)
HCT: 31.5 % — ABNORMAL LOW (ref 35.0–47.0)
HGB: 9.5 g/dL — ABNORMAL LOW (ref 11.5–16.0)
IMMATURE GRANULOCYTES: 0 % (ref 0.0–0.5)
LYMPHOCYTES: 27 % (ref 12–49)
MCH: 28.8 PG (ref 26.0–34.0)
MCHC: 30.2 g/dL (ref 30.0–36.5)
MCV: 95.5 FL (ref 80.0–99.0)
MONOCYTES: 8 % (ref 5–13)
MPV: 11.3 FL (ref 8.9–12.9)
NEUTROPHILS: 61 % (ref 32–75)
NRBC: 0 PER 100 WBC
PLATELET: 95 10*3/uL — ABNORMAL LOW (ref 150–400)
RBC: 3.3 M/uL — ABNORMAL LOW (ref 3.80–5.20)
RDW: 16.4 % — ABNORMAL HIGH (ref 11.5–14.5)
WBC: 7.5 10*3/uL (ref 3.6–11.0)

## 2021-05-11 LAB — METABOLIC PANEL, BASIC
Anion gap: 0 mmol/L — ABNORMAL LOW (ref 5–15)
BUN/Creatinine ratio: 18 (ref 12–20)
BUN: 20 MG/DL (ref 6–20)
CO2: 39 mmol/L — ABNORMAL HIGH (ref 21–32)
Calcium: 9 MG/DL (ref 8.5–10.1)
Chloride: 98 mmol/L (ref 97–108)
Creatinine: 1.12 MG/DL — ABNORMAL HIGH (ref 0.55–1.02)
Glucose: 120 mg/dL — ABNORMAL HIGH (ref 65–100)
Potassium: 4.4 mmol/L (ref 3.5–5.1)
Sodium: 137 mmol/L (ref 136–145)
eGFR: 55 mL/min/{1.73_m2} — ABNORMAL LOW (ref >60)

## 2021-05-11 LAB — EKG 12-LEAD
Atrial Rate: 49 {beats}/min
P Axis: 67 degrees
P-R Interval: 160 ms
Q-T Interval: 350 ms
QRS Duration: 160 ms
QTc Calculation (Bazett): 444 ms
R Axis: -117 degrees
T Axis: 6 degrees
Ventricular Rate: 97 {beats}/min

## 2021-05-11 LAB — BASIC METABOLIC PANEL
Anion Gap: 0 mmol/L — ABNORMAL LOW (ref 5–15)
BUN: 20 MG/DL (ref 6–20)
Bun/Cre Ratio: 18 (ref 12–20)
CO2: 39 mmol/L — ABNORMAL HIGH (ref 21–32)
Calcium: 9 MG/DL (ref 8.5–10.1)
Chloride: 98 mmol/L (ref 97–108)
Creatinine: 1.12 MG/DL — ABNORMAL HIGH (ref 0.55–1.02)
ESTIMATED GLOMERULAR FILTRATION RATE: 55 mL/min/{1.73_m2} — ABNORMAL LOW (ref >60)
Glucose: 120 mg/dL — ABNORMAL HIGH (ref 65–100)
Potassium: 4.4 mmol/L (ref 3.5–5.1)
Sodium: 137 mmol/L (ref 136–145)

## 2021-05-11 LAB — CBC WITH AUTO DIFFERENTIAL
Basophils %: 1 % (ref 0–1)
Basophils Absolute: 0.1 10*3/uL (ref 0.0–0.1)
Eosinophils %: 3 % (ref 0–7)
Eosinophils Absolute: 0.2 10*3/uL (ref 0.0–0.4)
Granulocyte Absolute Count: 0 10*3/uL (ref 0.00–0.04)
Hematocrit: 31.5 % — ABNORMAL LOW (ref 35.0–47.0)
Hemoglobin: 9.5 g/dL — ABNORMAL LOW (ref 11.5–16.0)
Immature Granulocytes: 0 % (ref 0.0–0.5)
Lymphocytes %: 27 % (ref 12–49)
Lymphocytes Absolute: 2 10*3/uL (ref 0.8–3.5)
MCH: 28.8 PG (ref 26.0–34.0)
MCHC: 30.2 g/dL (ref 30.0–36.5)
MCV: 95.5 FL (ref 80.0–99.0)
MPV: 11.3 FL (ref 8.9–12.9)
Monocytes %: 8 % (ref 5–13)
Monocytes Absolute: 0.6 10*3/uL (ref 0.0–1.0)
NRBC Absolute: 0 10*3/uL (ref 0.00–0.01)
Neutrophils %: 61 % (ref 32–75)
Neutrophils Absolute: 4.6 10*3/uL (ref 1.8–8.0)
Nucleated RBCs: 0 PER 100 WBC
Platelets: 95 10*3/uL — ABNORMAL LOW (ref 150–400)
RBC: 3.3 M/uL — ABNORMAL LOW (ref 3.80–5.20)
RDW: 16.4 % — ABNORMAL HIGH (ref 11.5–14.5)
WBC: 7.5 10*3/uL (ref 3.6–11.0)

## 2021-05-11 LAB — POCT GLUCOSE
POC Glucose: 130 mg/dL — ABNORMAL HIGH (ref 65–117)
POC Glucose: 124 mg/dL — ABNORMAL HIGH (ref 65–117)
POC Glucose: 142 mg/dL — ABNORMAL HIGH (ref 65–117)
POC Glucose: 112 mg/dL (ref 65–117)

## 2021-05-11 MED ORDER — TRAZODONE 50 MG TAB
50 mg | Freq: Every evening | ORAL | Status: AC
Start: 2021-05-11 — End: 2021-05-29
  Administered 2021-05-12 – 2021-05-29 (×18): via ORAL

## 2021-05-11 MED ORDER — OXYCODONE 5 MG TAB
5 mg | Freq: Four times a day (QID) | ORAL | Status: AC | PRN
Start: 2021-05-11 — End: 2021-05-29
  Administered 2021-05-11 – 2021-05-26 (×17): via ORAL

## 2021-05-11 MED ORDER — METOPROLOL TARTRATE 25 MG TAB
25 mg | Freq: Once | ORAL | Status: AC
Start: 2021-05-11 — End: 2021-05-11
  Administered 2021-05-11: 20:00:00 via ORAL

## 2021-05-11 MED ORDER — OXYCODONE 5 MG TAB
5 mg | Freq: Once | ORAL | Status: AC
Start: 2021-05-11 — End: 2021-05-10
  Administered 2021-05-11: 03:00:00 via ORAL

## 2021-05-11 MED ORDER — TRAZODONE 50 MG TAB
50 mg | Freq: Once | ORAL | Status: DC
Start: 2021-05-11 — End: 2021-05-11

## 2021-05-11 MED ORDER — METOPROLOL SUCCINATE SR 50 MG 24 HR TAB
50 mg | Freq: Every day | ORAL | Status: DC
Start: 2021-05-11 — End: 2021-05-16
  Administered 2021-05-12 – 2021-05-15 (×4): via ORAL

## 2021-05-11 MED FILL — INSULIN LISPRO 100 UNIT/ML INJECTION: 100 unit/mL | SUBCUTANEOUS | Qty: 2

## 2021-05-11 MED FILL — GLIPIZIDE 5 MG TAB: 5 mg | ORAL | Qty: 1

## 2021-05-11 MED FILL — BUDESONIDE 0.5 MG/2 ML NEB SUSPENSION: 0.5 mg/2 mL | RESPIRATORY_TRACT | Qty: 1

## 2021-05-11 MED FILL — METOPROLOL TARTRATE 25 MG TAB: 25 mg | ORAL | Qty: 1

## 2021-05-11 MED FILL — ACETAMINOPHEN 325 MG TABLET: 325 mg | ORAL | Qty: 2

## 2021-05-11 MED FILL — ARFORMOTEROL 15 MCG/2 ML NEB SOLUTION: 15 mcg/2 mL | RESPIRATORY_TRACT | Qty: 2

## 2021-05-11 MED FILL — HEPARIN (PORCINE) 5,000 UNIT/ML IJ SOLN: 5000 unit/mL | INTRAMUSCULAR | Qty: 1

## 2021-05-11 MED FILL — CEFTRIAXONE 2 GRAM SOLUTION FOR INJECTION: 2 gram | INTRAMUSCULAR | Qty: 2

## 2021-05-11 MED FILL — BUMETANIDE 0.25 MG/ML IJ SOLN: 0.25 mg/mL | INTRAMUSCULAR | Qty: 4

## 2021-05-11 MED FILL — CITALOPRAM 20 MG TAB: 20 mg | ORAL | Qty: 2

## 2021-05-11 MED FILL — OXYCODONE 5 MG TAB: 5 mg | ORAL | Qty: 1

## 2021-05-11 MED FILL — CARAFATE 1 GRAM TABLET: 1 gram | ORAL | Qty: 1

## 2021-05-11 MED FILL — ATORVASTATIN 10 MG TAB: 10 mg | ORAL | Qty: 1

## 2021-05-11 MED FILL — METOPROLOL SUCCINATE SR 25 MG 24 HR TAB: 25 mg | ORAL | Qty: 1

## 2021-05-11 MED FILL — ASPIRIN 81 MG CHEWABLE TAB: 81 mg | ORAL | Qty: 1

## 2021-05-11 MED FILL — LEVOTHYROXINE 150 MCG TAB: 150 mcg | ORAL | Qty: 1

## 2021-05-11 MED FILL — MONTELUKAST 10 MG TAB: 10 mg | ORAL | Qty: 1

## 2021-05-11 NOTE — Progress Notes (Signed)
Progress Notes by Sharla Kidney, NP at 05/11/21 [phone]                Author: Sharla Kidney, NP  Service: Cardiology  Author Type: Nurse Practitioner       Filed: 05/11/21 0845  Date of Service: 05/11/21 0841  Status: Attested           Editor: Sharla Kidney, NP (Nurse Practitioner)  Cosigner: Victorino December, MD at 05/11/21 1112          Attestation signed by Victorino December, MD at 05/11/21 1112          Cardiology Attending:      Patient personally seen and examined. All the elements of history and examination were personally performed. Assessment and plan was discussed and agree as written above.          Briefly      Deborah [name] is a 66 y.o. female with PMH of morbid obesity, PAF, IE of the atrial lead, AVR admitted with incresing SOB. Apparently has not received diuretics at SNF.       Feeling better with SOB. She is sitting in chair today.       AAOx3   Chest - clear to auscultation, no wheezes, rales or rhonchi   Heart - normal rate, irregular rhythm, normal S1, S2, no murmurs, rubs, clicks or gallops   Abdomen - soft, nontender, nondistended, no masses or organomegaly   Extremities - peripheral pulses normal, 1+ pedal edema         Assessment:      - HFpEF   - PAF, PPM   - Respiratory failure   - DM   - DKA   - Atrial lead endocarditis         Plan:       - Diurese   - Antibiotics   - Pacing                ___________________________________________________      Rod Can. Dagoberto Reef, MD, [facility]                                                                                                                   [place] [facility] CARDIOLOGY                     Cardiology Care Note       Initial Encounter      Follow-up        Patient Name: Deborah [name] - DOB:[birth year] - ZHY:[id number]   Primary Cardiologist: Sondra Barges Cardiology Physicians: Thurston Pounds MD   Consulting Cardiologist: Sondra Barges Cardiology Physicians: Drue Second MD        Reason for encounter: CHF       HPI:         Deborah [name] is a 66 y.o.  female with PMH of HF p EF, a fib, possible atrial lead vegetation treated abx, repeat ECHO with no evidence of vegetation with COPD, A/C  resp failure who was recently discharged from hospital on  04/24/2021 to [facility] for rehab.       She says that she had not been receiving her Bumex on a consistent basis and missed several days dosing.       She started having a progressively worsening SOB associated with lower extremity swelling. She says she was using oxygen but required supplementation in the ED. She denies any cough or fever. No chills, nausea or vomiting.      Subjective:        Deborah [name] reports she still has dyspnea but somewhat improved.          Assessment and Plan          1 A/C HF p EF: seems as if her diuretics were not being administered at SNF. pBNP is improved and chest xray unchanged.  Cont IV loops for another day,  plan to switch to PO tomorrow      2 Hx a fib s/p PPM (not MRI compatible)/ s/p AVR: ectopy noted on tele, check Mg, K 4.4. Was NOT bradycardic overnight by tele review. Cont metoprolol       3 Bacteremia due to group B Streptococcus: during recent admission, she had GBS bacteremia complicated by pacemaker line vegetation. Continue IV Ceftriaxone scheduled to run through 05/23/21.       4 Acute on chronic respiratory failure with hypoxia and hypercapnia/COPD: On NC        5 Hypokalemia : replete lytes        6 T 2 DM:        7 CKD:       8 obesity: Body mass index is 55.13 kg/m.                     ____________________________________________________________      Cardiac testing   04/08/21      ECHO ADULT FOLLOW-UP OR LIMITED 04/17/2021 04/17/2021      Interpretation Summary   ?  Aortic Valve: Not well visualized. Edwards bovine bioprosthetic valve with a size of 25 mm. Mild regurgitation.  Stenosis of the aortic valve. AV mean gradient is 38 mmHg. AV area by continuity VTI is 1.0 cm2.   ?  Technical qualifiers: Echo study was technically difficult due to patient's body  habitus.      Signed by: Candyce Churn, DO on 04/17/2021  5:00 PM         02/16/21      NUCLEAR CARDIAC STRESS TEST 02/20/2021 02/26/2021      Interpretation Summary   ?  Nuclear Findings: LV perfusion is normal. There is no evidence of inducible ischemia.   ?  Nuclear Findings: The defect appears to be caused by breast attenuation.   ?  ECG: Resting ECG demonstrates normal sinus rhythm.   ?  ECG: Stress ECG was negative for ischemia.   ?  Stress Test: A pharmacological stress test was performed using lexiscan. Hemodynamics are adequate for diagnosis. Blood pressure demonstrated  a normal response and heart rate demonstrated a normal response to stress. The patient's heart rate recovery was normal. The patient reported dyspnea and no chest pain during the stress test.      Ventricular bigeminy   Unifocal PVC, RBBB and superior axis PVCs      Signed by: Thurston Pounds, MD on 02/20/2021  5:00 PM      Most recent HS troponins:     Recent Labs  05/10/21   1351  05/09/21   2014         TROPHS  70*  82*           ECG:      EKG Results                  Procedure  720  Value  Units  Date/Time           EKG 12 LEAD INITIAL [[id number]]  Collected: 05/10/21 0635       Order Status: Completed  Updated: 05/10/21 1547                Ventricular Rate  80  BPM           Atrial Rate  60  BPM           P-R Interval  212  ms           QRS Duration  112  ms           Q-T Interval  526  ms           QTC Calculation (Bezet)  606  ms           Calculated P Axis  61  degrees           Calculated R Axis  -55  degrees           Calculated T Axis  -158  degrees                Diagnosis  --             Atrial-paced rhythm with prolonged AV conduction with frequent premature    ventricular complexes   Incomplete right bundle branch block   Left anterior fascicular block   Septal infarct , age undetermined   T wave abnormality, consider lateral ischemia   Prolonged QT   Abnormal ECG   When compared with ECG of 09-May-2021 20:16,   MANUAL  COMPARISON REQUIRED, DATA IS UNCONFIRMED              EKG 12 LEAD INITIAL [[id number]]  Collected: 05/09/21 2016       Order Status: Completed  Updated: 05/10/21 1036                Ventricular Rate  97  BPM           Atrial Rate  49  BPM           P-R Interval  160  ms           QRS Duration  160  ms           Q-T Interval  350  ms           QTC Calculation (Bezet)  444  ms           Calculated P Axis  67  degrees           Calculated R Axis  -117  degrees           Calculated T Axis  6  degrees                Diagnosis  --             Sinus bradycardia with marked sinus arrhythmia with frequent atrial-paced    complexes and with frequent and consecutive premature ventricular complexes   Right bundle branch block   Possible Lateral infarct ,  age undetermined   Abnormal ECG   When compared with ECG of 13-Apr-2021 22:14,   premature ventricular complexes are now present   premature atrial complexes are no longer present   Borderline criteria for Lateral infarct are now present                           Review of Systems:      All other systems reviewed and all negative except as written in HPI       Patient unable to provide secondary  to condition        Past Medical History:        Diagnosis  Date         ?  (HFpEF) heart failure with preserved ejection fraction (HCC)       ?  Anxiety and depression       ?  Aortic valve replaced            S/p bovine aortic valve replacement.         ?  Asthma       ?  Chronic narcotic use       ?  Chronic obstructive pulmonary disease (HCC)       ?  Chronic pain       ?  CKD (chronic kidney disease), stage III (HCC)            Baseline creatinine is 1.3-1.4 with GFR in the 40s.         ?  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  GERD (gastroesophageal reflux disease)       ?  History of vascular access device  04/13/2021          4 FR Single PICC for LTABX: R cephalic vessell length 48 CM Max P leave @ 1 CM out; Arm circumferenc 40 CM         ?  Hyperlipidemia       ?   Hypothyroidism       ?  Morbid obesity (HCC)       ?  Neuropathy       ?  Obstructive sleep apnea           ?  Rhinitis            Past Surgical History:         Procedure  Laterality  Date          ?  COLONOSCOPY  N/A  12/01/2020          COLONOSCOPY performed by Ian Malkin, MD at [facility] ENDOSCOPY          ?  HX AORTIC VALVE REPLACEMENT              Bovine bioprosthetic          ?  HX PACEMAKER            Social Hx:  reports that she quit smoking about 11 years ago. Her smoking use included cigarettes. She has a 40.00 pack-year smoking history. She has never used smokeless tobacco. She reports that she does not currently use alcohol. She reports that she  does not use drugs.   Family Hx: family history includes Hypertension in her father and mother.     Allergies        Allergen  Reactions         ?  Nitroglycerin  Unknown (comments)             hypotension         ?  Aloe Vera  Rash     ?  Hydrochlorothiazide  Other (comments)             Reports 'kidneys dry up"          ?  Tetanus And Diphther. Tox (Pf)  Swelling             Swelling of arm and it turns black              OBJECTIVE:     Wt Readings from Last 3 Encounters:        05/11/21  (!) 159.7 kg (352 lb)     04/24/21  (!) 160.8 kg (354 lb 8 oz)        03/09/21  149.7 kg (330 lb)           Intake/Output Summary (Last 24 hours) at 05/11/2021 0842   Last data filed at 05/11/2021 0334     Gross per 24 hour        Intake  1590 ml        Output  850 ml        Net  740 ml           Physical Exam:      Vitals:      Vitals:             05/11/21 0347  05/11/21 0538  05/11/21 0731  05/11/21 0753           BP:    134/65    (!) 129/95     Pulse:    88    90     Resp:        19     Temp:        98.5 F (36.9 C)     SpO2:      97%  92%     Weight:  (!) 159.7 kg (352 lb)                 Height:                Telemetry: SR       Gen: Morbidly obese, in no distress    Neck: Supple, No JVD, No Carotid Bruit   Resp: No accessory muscle use, diminished breath sounds, No rales or  rhonchi   Card: Regular Rate,Rythm, Normal S1, S2, No murmurs, rubs or gallop.    Abd:   obese, Soft, non-tender, non-distended, BS+    MSK: No cyanosis   Skin: No rashes     Neuro: Moving all four extremities, follows commands appropriately   Psych: Fair insight, oriented to person, place, alert, Nml Affect   LE: 3+ edema      Data Review:       Radiology:    XR Results (most recent):   Results from Hospital Encounter encounter on 05/09/21      XR CHEST PORT      Narrative   EXAM:  XR CHEST PORT      INDICATION: Short of breath      COMPARISON: 04/16/2021      TECHNIQUE: portable chest AP view      FINDINGS: A right-sided PICC line is in place. A left subclavian pacemaker is in   place. The patient status post median sternotomy. The heart  is moderately   enlarged, but unchanged. There is unchanged diffuse interstitial prominence.. No   pleural effusion or pneumothorax.      Impression   Unchanged moderate cardiomegaly. Unchanged diffuse interstitial   prominence which may be related to edema or atypical (viral) infection.           Recent Labs            05/11/21   0014  05/10/21   0237     NA  137  137     K  4.4  3.2*     CL  98  98     CO2  39*  39*     BUN  20  17     CREA  1.12*  1.22*     GLU  120*  119*         CA  9.0  8.7          Recent Labs            05/11/21   0014  05/10/21   0237     WBC  7.5  5.0     HGB  9.5*  8.9*     HCT  31.5*  29.6*         PLT  95*  77*          Recent Labs            05/10/21   0237  05/09/21   2014         AP  60  61        No results for input(s): CHOL, LDLC in the last 72 hours.      No lab exists for component: TGL, HDLC,  HBA1C         Current meds:      Current Facility-Administered Medications:    ?  albuterol-ipratropium (DUO-NEB) 2.5 MG-0.5 MG/3 ML, 3 mL, Nebulization, Q4H PRN, Jamil, Nora, DO, 3 mL at 05/10/21 0700   ?  heparin (porcine) injection 5,000 Units, 5,000 Units, SubCUTAneous, Q8H, Horald Chestnut, DO, 5,000 Units at 05/11/21 0836   ?  insulin lispro (HUMALOG)  injection, , SubCUTAneous, AC&HS, Horald Chestnut, DO, 2 Units at 05/10/21 1641   ?  glucose chewable tablet 16 g, 4 Tablet, Oral, PRN, Horald Chestnut, DO   ?  glucagon (GLUCAGEN) injection 1 mg, 1 mg, IntraMUSCular, PRN, Jamil, Arna Medici, DO   ?  dextrose 10% infusion 0-250 mL, 0-250 mL, IntraVENous, PRN, Jamil, Arna Medici, DO   ?  arformoteroL (BROVANA) neb solution 15 mcg, 15 mcg, Nebulization, BID RT, Jamil, Arna Medici, DO, 15 mcg at 05/11/21 0729   ?  budesonide (PULMICORT) 500 mcg/2 ml nebulizer suspension, 500 mcg, Nebulization, BID RT, Jamil, Nora, DO, 500 mcg at 05/11/21 0729   ?  [Held by provider] traZODone (DESYREL) tablet 100 mg, 100 mg, Oral, ONCE, Bronson Ing, NP   ?  sodium chloride (NS) flush 5-40 mL, 5-40 mL, IntraVENous, Q8H, Melynda Keller, MD, 10 mL at 05/11/21 0541   ?  sodium chloride (NS) flush 5-40 mL, 5-40 mL, IntraVENous, PRN, Melynda Keller, MD   ?  acetaminophen (TYLENOL) tablet 650 mg, 650 mg, Oral, Q6H PRN, 650 mg at 05/11/21 0421 **OR** acetaminophen (TYLENOL) suppository 650 mg, 650 mg, Rectal, Q6H PRN, Melynda Keller, MD   ?  polyethylene glycol (MIRALAX) packet 17 g, 17 g, Oral, DAILY PRN, Melynda Keller, MD   ?  senna (SENOKOT) tablet  8.6 mg, 1 Tablet, Oral, DAILY PRN, Melynda Keller, MD   ?  promethazine (PHENERGAN) tablet 12.5 mg, 12.5 mg, Oral, Q6H PRN **OR** ondansetron (ZOFRAN) injection 4 mg, 4 mg, IntraVENous, Q6H PRN, Melynda Keller, MD   ?  bumetanide (BUMEX) injection 1 mg, 1 mg, IntraVENous, BID, Melynda Keller, MD, 1 mg at 05/11/21 0538   ?  cefTRIAXone (ROCEPHIN) 2 g in 0.9% sodium chloride 20 mL IV syringe, 2 g, IntraVENous, Q24H, Melynda Keller, MD, 2 g at 05/11/21 0836   ?  aspirin chewable tablet 81 mg, 81 mg, Oral, DAILY, Melynda Keller, MD, 81 mg at 05/11/21 7628   ?  atorvastatin (LIPITOR) tablet 10 mg, 10 mg, Oral, DAILY, Melynda Keller, MD, 10 mg at 05/11/21 0836   ?  citalopram (CELEXA) tablet 40 mg, 40 mg, Oral, DAILY, Melynda Keller, MD, 40 mg at  05/11/21 0836   ?  glipiZIDE (GLUCOTROL) tablet 5 mg, 5 mg, Oral, BID WITH MEALS, Melynda Keller, MD, 5 mg at 05/11/21 0836   ?  levothyroxine (SYNTHROID) tablet 150 mcg, 150 mcg, Oral, ACB, Melynda Keller, MD, 150 mcg at 05/11/21 0538   ?  metoprolol succinate (TOPROL-XL) XL tablet 25 mg, 25 mg, Oral, DAILY, Melynda Keller, MD, 25 mg at 05/11/21 0836   ?  montelukast (SINGULAIR) tablet 10 mg, 10 mg, Oral, QHS, Melynda Keller, MD, 10 mg at 05/10/21 2136   ?  sucralfate (CARAFATE) tablet 1 g, 1 g, Oral, TID, Melynda Keller, MD, 1 g at 05/11/21 0836      Sharla Kidney, NP      [facility] Cardiology   Call center: (P) [phone]  (F) [phone]         CC:Barrett, Lance Sell, NP

## 2021-05-11 NOTE — Progress Notes (Signed)
RRT evaluation:Pt is having several episode of v tach, with symptomatic chest pain. O2 is increased, pain med will be given, and ECG is done. Cardiologist will be notified. Pt will be upgraded level of care    MEWS 3  Sepsis Score 3  Deterioration Index 42    Spoke with primary RN regarding pt status. Pt resting comfortably in bed with no s/s of distress. Denies CP or SOB. No further interventions at this time.     Rod Holler, RN

## 2021-05-11 NOTE — Progress Notes (Signed)
ADULT PROTOCOL: JET AEROSOL  REASSESSMENT    Patient  Deborah [name]     66 y.o.   female     05/11/2021  8:49 AM    Breath Sounds Pre Procedure: Right Breath Sounds: Expiratory wheezing                               Left Breath Sounds: Expiratory wheezing    Breath Sounds Post Procedure: Right Breath Sounds: Expiratory wheezing                                 Left Breath Sounds: Expiratory wheezing    Breathing pattern: Pre procedure Breathing Pattern: Regular          Post procedure Breathing Pattern: Regular    Heart Rate: Pre procedure Pulse: 87           Post procedure Pulse: 87    Resp Rate: Pre procedure Respirations: 18           Post procedure Respirations: 18    Cough: Pre procedure Cough: Non-productive               Post procedure Cough: Non-productive    Oxygen: O2 Device: Nasal cannula        Changed: no    SpO2: Pre procedure SpO2: 97 %                 Post procedure SpO2: 97 %      Nebulizer Therapy: Current medications Aerosolized Medications: Brovana, Pulmicort      Changed: no    Problem List:   Patient Active Problem List   Diagnosis Code    Arm paresthesia, left R20.2    Thrombocytopenia (HCC) D69.6    Morbid obesity (HCC) E66.01    CKD (chronic kidney disease), stage III (HCC) N18.30    Aortic valve replaced Z95.2    Pacemaker Z95.0    Hypothyroidism E03.9    Asthma J45.909    Anxiety and depression F41.9, F32.A    DM (diabetes mellitus), type 2 with complications (HCC) E11.8    Chronic narcotic use F11.90    Chronic pain G89.29    Rhinitis J31.0    Hyperlipidemia E78.5    Neuropathy G62.9    GERD (gastroesophageal reflux disease) K21.9    CHF (congestive heart failure) (HCC) I50.9    Elevated bilirubin R17    Liver cirrhosis (HCC) K74.60    Colitis K52.9    Acute exacerbation of CHF (congestive heart failure) (HCC) I50.9    Acute on chronic diastolic (congestive) heart failure (HCC) I50.33    PVC (premature ventricular contraction) I49.3    Systemic inflammatory response syndrome (SIRS) (HCC)  R65.10    Acute on chronic heart failure with preserved ejection fraction (HFpEF) (HCC) I50.33    Bacteremia due to group B Streptococcus R78.81, B95.1    Chronic respiratory failure with hypoxia (HCC) J96.11    BMI 50.0-59.9, adult (HCC) Z68.43    Hypokalemia due to loss of potassium E87.6    Acute on chronic respiratory failure with hypoxia and hypercapnia (HCC) J96.21, J96.22    OSA (obstructive sleep apnea) G47.33    COPD (chronic obstructive pulmonary disease) (HCC) J44.9    History of PSVT (paroxysmal supraventricular tachycardia) Z86.79    Anemia D64.9    Hypomagnesemia E83.42       Respiratory Therapist: Caprice Beaver

## 2021-05-11 NOTE — Progress Notes (Signed)
Problem: Pressure Injury - Risk of  Goal: *Prevention of pressure injury  Description: Document Braden Scale and appropriate interventions in the flowsheet.  Outcome: Progressing Towards Goal  Note: Pressure Injury Interventions:  Sensory Interventions: Assess need for specialty bed, Discuss PT/OT consult with provider, Keep linens dry and wrinkle-free, Maintain/enhance activity level, Pressure redistribution bed/mattress (bed type)    Moisture Interventions: Absorbent underpads, Apply protective barrier, creams and emollients, Check for incontinence Q2 hours and as needed, Maintain skin hydration (lotion/cream)    Activity Interventions: Assess need for specialty bed, Pressure redistribution bed/mattress(bed type), PT/OT evaluation    Mobility Interventions: Pressure redistribution bed/mattress (bed type), PT/OT evaluation, Turn and reposition approx. every two hours(pillow and wedges)    Nutrition Interventions: Document food/fluid/supplement intake, Discuss nutritional consult with provider    Friction and Shear Interventions: Apply protective barrier, creams and emollients, Lift team/patient mobility team, Transferring/repositioning devices                Problem: Patient Education: Go to Patient Education Activity  Goal: Patient/Family Education  Outcome: Progressing Towards Goal     Problem: Falls - Risk of  Goal: *Absence of Falls  Description: Document Bridgette Habermann Fall Risk and appropriate interventions in the flowsheet.  Outcome: Progressing Towards Goal  Note: Fall Risk Interventions:                                Problem: Patient Education: Go to Patient Education Activity  Goal: Patient/Family Education  Outcome: Progressing Towards Goal     Problem: Breathing Pattern - Ineffective  Goal: *Absence of hypoxia  Outcome: Progressing Towards Goal  Goal: *Use of effective breathing techniques  Outcome: Progressing Towards Goal

## 2021-05-11 NOTE — Progress Notes (Signed)
0700  Bedside and Verbal shift change report given to Charlies Constable RN (oncoming nurse) by Ashok Cordia (offgoing nurse). Report included the following information SBAR, Kardex, Intake/Output, MAR, Accordion, Recent Results, and Cardiac Rhythm Apaced .     Initial assessment done. AOX4. Denies any pain. Sitting on the recliner. Call bell within reach. Will continue to monitor.     1550  Patient complaining of sharp pain on the left chest, EKG done. Darl Pikes NP aware, no orders given. PRN medication for pain given.       Visit Vitals  BP 118/78   Pulse (!) 123   Temp 98.5 F (36.9 C)   Resp 23   Ht 5\' 7"  (1.702 m)   Wt 154 kg (339 lb 8.1 oz)   SpO2 90%   BMI 53.17 kg/m     1900  Bedside and Verbal shift change report given to Tisyl RN (oncoming nurse) by RN (offgoing nurse). Report included the following information SBAR, Kardex, Intake/Output, MAR, Accordion, and Recent Results.     Central line Type:PICC  Central Line Insert Date: Prior to admission  Reason Central Line Placed: long term abx  Central Line Dressing Date: 04/10  Biopatch in place? Yes No: yes  Tubing labeled and appropriate? Yes No: yes  Alcohol caps on all open ports? Yes No: yes  Last CHG bath (time&date): 04/14 1156  Reviewed with provider and central line must stay in for the following reasons: long term IV abx

## 2021-05-11 NOTE — Progress Notes (Signed)
Patient has been identified as appropriate for SNF placement. SNF rehab discussed with patient.  SNF providers discussed, patient remembered SNF choices from last admission. Choice is [facility], [facility], [facility], [facility], referral sent via Midwife.  Freedom of Choice obtained. Await response.    Care Management Interventions  PCP Verified by CM: Yes [facility])  Mode of Transport at Discharge: BLS  Transition of Care Consult (CM Consult): SNF  Partner SNF: Yes  MyChart Signup: No  Discharge Durable Medical Equipment: No  Physical Therapy Consult: Yes  Occupational Therapy Consult: Yes  Speech Therapy Consult: No  Support Systems: Child(ren)  Confirm Follow Up Transport: Family  The Patient and/or Patient Representative was Provided with a Choice of Provider and Agrees with the Discharge Plan?: (S) Yes  Freedom of Choice List was Provided with Basic Dialogue that Supports the Patient's Individualized Plan of Care/Goals, Treatment Preferences and Shares the Quality Data Associated with the Providers?: (S) Yes  Discharge Location  Patient Expects to be Discharged to:: Skilled nursing facility    Midge Aver, RN, MSN/Care manager

## 2021-05-11 NOTE — Care Coordination-Inpatient (Signed)
Met with patient at bedside.  Introduced self and role of HF NN.  Patient was sitting up in chair.  Patient known to me from admissions in Nov and Dec, 2022.  Reviewed diastolic HF in setting of DM, atrial fib, and CKD.  Reviewed symptoms of HF and rationale for daily weight.  Patient states she was aware when she started retaining fluid and asked for daily weight at the [facility].  They weighed her daily and were aware her weight was going up but they did not give her bumex because of concern for her kidney function.  Patient is improving slowly with IV diuresis.  She is hopeful that she is strong enough to return home with home health.  She does not want to return to same nursing facility.  Reviewed HF self care measures including low sodium diet.  Patient was given written HF information, HF magnet, HF calendar, low sodium diet handout, and HF zone handout.  Patient has Living with HF book at home and does not need another.  Patient lives with her son and his family and has support at home.

## 2021-05-11 NOTE — Progress Notes (Signed)
[place] [facility]  [address] [place], [place] [address]  [phone]      Medical Progress Note      NAME: Deborah [name]   DOB:  [birth year]  MRM:  [id number]    Date/Time of service: 05/11/2021       Subjective:     Chief Complaint:  Patient was personally seen and examined by me during this time period.  Chart reviewed. Fu dyspnea. Continues to endorse dyspnea, LE swelling.        Objective:       Vitals:       Last 24hrs VS reviewed since prior progress note. Most recent are:    Visit Vitals  BP 114/60 (BP 1 Location: Right lower arm, BP Patient Position: At rest;Sitting)   Pulse 87   Temp 98.6 F (37 C)   Resp 20   Ht 5\' 7"  (1.702 m)   Wt (!) 159.7 kg (352 lb)   SpO2 99%   BMI 55.13 kg/m     SpO2 Readings from Last 6 Encounters:   05/11/21 99%   04/24/21 97%   03/09/21 96%   01/26/21 90%   12/03/20 96%   11/05/20 92%    O2 Flow Rate (L/min): 3 l/min     Intake/Output Summary (Last 24 hours) at 05/11/2021 1149  Last data filed at 05/11/2021 0840  Gross per 24 hour   Intake 1826 ml   Output 1550 ml   Net 276 ml          Exam:     Physical Exam:    General: NAD   Eyes: Pink conjunctivae, PERRLA with no discharge. Normal eye movements  Ear, Nose, Mouth & Throat: No ottorrhea, rhinorrhea  Respiratory:  No accessory muscle use, decreased breath sounds with crackles  Cardiovascular:  No JVD or murmurs, regular and normal S1, S2 without thrills, bruits. B/l peripheral edema.   GI & GU:  Soft abdomen, obese, non tender, normoactive bowel sounds with no palpable organomegaly  Heme:  No cervical or axillary adenopathy.   Musculoskeletal:  No cyanosis, clubbing, atrophy or deformities  Skin:  No rashes, bruising or ulcers   Neurological: Awake and alert, speech is clear, CNs 2-12 are grossly intact and otherwise non focal  Psychiatric:  fair insight       Medications Reviewed: (see below)    Lab Data Reviewed: (see  below)    ______________________________________________________________________    Medications:     Current Facility-Administered Medications   Medication Dose Route Frequency    traZODone (DESYREL) tablet 100 mg  100 mg Oral QHS    oxyCODONE IR (ROXICODONE) tablet 5 mg  5 mg Oral Q6H PRN    albuterol-ipratropium (DUO-NEB) 2.5 MG-0.5 MG/3 ML  3 mL Nebulization Q4H PRN    heparin (porcine) injection 5,000 Units  5,000 Units SubCUTAneous Q8H    insulin lispro (HUMALOG) injection   SubCUTAneous AC&HS    glucose chewable tablet 16 g  4 Tablet Oral PRN    glucagon (GLUCAGEN) injection 1 mg  1 mg IntraMUSCular PRN    dextrose 10% infusion 0-250 mL  0-250 mL IntraVENous PRN    arformoteroL (BROVANA) neb solution 15 mcg  15 mcg Nebulization BID RT    budesonide (PULMICORT) 500 mcg/2 ml nebulizer suspension  500 mcg Nebulization BID RT    sodium chloride (NS) flush 5-40 mL  5-40 mL IntraVENous Q8H    sodium chloride (NS) flush 5-40 mL  5-40 mL  IntraVENous PRN    acetaminophen (TYLENOL) tablet 650 mg  650 mg Oral Q6H PRN    Or    acetaminophen (TYLENOL) suppository 650 mg  650 mg Rectal Q6H PRN    polyethylene glycol (MIRALAX) packet 17 g  17 g Oral DAILY PRN    senna (SENOKOT) tablet 8.6 mg  1 Tablet Oral DAILY PRN    promethazine (PHENERGAN) tablet 12.5 mg  12.5 mg Oral Q6H PRN    Or    ondansetron (ZOFRAN) injection 4 mg  4 mg IntraVENous Q6H PRN    bumetanide (BUMEX) injection 1 mg  1 mg IntraVENous BID    cefTRIAXone (ROCEPHIN) 2 g in 0.9% sodium chloride 20 mL IV syringe  2 g IntraVENous Q24H    aspirin chewable tablet 81 mg  81 mg Oral DAILY    atorvastatin (LIPITOR) tablet 10 mg  10 mg Oral DAILY    citalopram (CELEXA) tablet 40 mg  40 mg Oral DAILY    glipiZIDE (GLUCOTROL) tablet 5 mg  5 mg Oral BID WITH MEALS    levothyroxine (SYNTHROID) tablet 150 mcg  150 mcg Oral ACB    metoprolol succinate (TOPROL-XL) XL tablet 25 mg  25 mg Oral DAILY    montelukast (SINGULAIR) tablet 10 mg  10 mg Oral QHS    sucralfate  (CARAFATE) tablet 1 g  1 g Oral TID          Lab Review:     Recent Labs     05/11/21  0014 05/10/21  0237 05/09/21  2014   WBC 7.5 5.0 5.6   HGB 9.5* 8.9* 9.3*   HCT 31.5* 29.6* 30.7*   PLT 95* 77* 83*       Recent Labs     05/11/21  0014 05/10/21  0237 05/09/21  2014   NA 137 137 140   K 4.4 3.2* 3.3*   CL 98 98 99   CO2 39* 39* 39*   GLU 120* 119* 174*   BUN 20 17 18    CREA 1.12* 1.22* 1.36*   CA 9.0 8.7 9.1   MG  --  3.0* 1.4*   ALB  --  2.8* 2.9*   TBILI  --  1.5* 1.4*   ALT  --  24 23       Lab Results   Component Value Date/Time    Glucose (POC) 142 (H) 05/11/2021 11:20 AM    Glucose (POC) 124 (H) 05/11/2021 07:58 AM    Glucose (POC) 130 (H) 05/10/2021 08:38 PM    Glucose (POC) 154 (H) 05/10/2021 03:49 PM    Glucose (POC) 132 (H) 05/10/2021 11:18 AM          Assessment / Plan:     Acute on chronic heart failure with preserved ejection fraction (HFpEF) (HCC) (05/09/2021) POA: likely due to missed bumex doses. CXR with e/o of diffuse edema; she also notes orthopnea and LE swelling. Echo 03/2021 showed an EF of 50-55%. Continue IV Bumex. Fluid restriction and Low sodium diet. Monitor Is/Os. cardiology following      Acute on chronic hypercapnia resp failure/ HX of COPD (HCC) (05/09/2021) POA: not in accute exacerbation. Continue nebs with Brovana, Pulmicort.  continue bipap prn and at night. Consider pulm consult     Hypokalemia due to loss of potassium /  Hypomagnesemia (05/09/2021) POA: replenish prn and monitor lytes      Thrombocytopenia (HCC) (04/30/2020) POA: unclear etiology. Chronic. Prior CT scans showed hepatic steatosis. Monitor for now;  stop aspirin and sub heparin if falls below <50k     CKD (chronic kidney disease), stage III (HCC) (04/30/2020) POA: monitor renal function with diuresis     Bacteremia due to group B Streptococcus (05/09/2021) POA: recent admission, w/ GBS bacteremia complicated by pacemaker line vegetation. Seen by cardiology and ID. Continue IV Ceftriaxone scheduled to run through  05/23/21.    Pacemaker / Aortic valve replaced / History of PSVT (paroxysmal supraventricular tachycardia) (05/09/2021) POA: she has a hx of a bioprosthetic AVR. Her pacer is not MRi compatible. Monitor      Anemia (05/09/2021) POA: likely due to chronic illness. Monitor    Hypothyroidism PO: TSH 0.76. Continue Levothyroxine    DM (diabetes mellitus), type 2 with complications POA: last A1c 5.7. Monitor blood glucose. continue Amaryl. ISS     BMI 50.0-59.9, adult (HCC) (05/09/2021) /  OSA (obstructive sleep apnea) (05/09/2021) POA: c/w BiPAP at night     Anxiety and depression POA: c/w Celexa.    Total time spent with patient: 35 minutes **I personally saw and examined the patient during this time period**                 Care Plan discussed with: Patient and Nursing Staff    Discussed:  Care Plan    Prophylaxis:  Hep SQ    Disposition:  SNF/LTC           ___________________________________________________    Attending Physician: Horald Chestnut, DO

## 2021-05-11 NOTE — Progress Notes (Signed)
Problem: Pressure Injury - Risk of  Goal: *Prevention of pressure injury  Description: Document Braden Scale and appropriate interventions in the flowsheet.  Outcome: Progressing Towards Goal  Note: Pressure Injury Interventions:  Sensory Interventions: Assess changes in LOC, Turn and reposition approx. every two hours (pillows and wedges if needed)    Moisture Interventions: Check for incontinence Q2 hours and as needed    Activity Interventions: PT/OT evaluation, Increase time out of bed    Mobility Interventions: Turn and reposition approx. every two hours(pillow and wedges), PT/OT evaluation    Nutrition Interventions: Document food/fluid/supplement intake, Offer support with meals,snacks and hydration    Friction and Shear Interventions: Transferring/repositioning devices                Problem: Patient Education: Go to Patient Education Activity  Goal: Patient/Family Education  Outcome: Progressing Towards Goal     Problem: Falls - Risk of  Goal: *Absence of Falls  Description: Document Schmid Fall Risk and appropriate interventions in the flowsheet.  Outcome: Progressing Towards Goal  Note: Fall Risk Interventions:                                Problem: Patient Education: Go to Patient Education Activity  Goal: Patient/Family Education  Outcome: Progressing Towards Goal

## 2021-05-12 LAB — METABOLIC PANEL, BASIC
Anion gap: NEGATIVE mmol/L (ref 5–15)
BUN/Creatinine ratio: 21 — ABNORMAL HIGH (ref 12–20)
BUN: 24 MG/DL — ABNORMAL HIGH (ref 6–20)
CO2: 40 mmol/L — ABNORMAL HIGH (ref 21–32)
Calcium: 8.7 MG/DL (ref 8.5–10.1)
Chloride: 97 mmol/L (ref 97–108)
Creatinine: 1.17 MG/DL — ABNORMAL HIGH (ref 0.55–1.02)
Glucose: 136 mg/dL — ABNORMAL HIGH (ref 65–100)
Potassium: 4 mmol/L (ref 3.5–5.1)
Sodium: 136 mmol/L (ref 136–145)
eGFR: 52 mL/min/{1.73_m2} — ABNORMAL LOW (ref >60)

## 2021-05-12 LAB — NT-PRO BNP: NT pro-BNP: 4979 PG/ML — ABNORMAL HIGH (ref <125)

## 2021-05-12 LAB — GLUCOSE, POC
Glucose (POC): 149 mg/dL — ABNORMAL HIGH (ref 65–117)
Glucose (POC): 170 mg/dL — ABNORMAL HIGH (ref 65–117)
Glucose (POC): 187 mg/dL — ABNORMAL HIGH (ref 65–117)
Glucose (POC): 126 mg/dL — ABNORMAL HIGH (ref 65–117)

## 2021-05-12 LAB — MAGNESIUM
Magnesium: 1.7 mg/dL (ref 1.6–2.4)
Magnesium: 1.7 mg/dL (ref 1.6–2.4)
Magnesium: 1.9 mg/dL (ref 1.6–2.4)
Magnesium: 1.9 mg/dL (ref 1.6–2.4)

## 2021-05-12 LAB — CBC WITH AUTOMATED DIFF
ABS. BASOPHILS: 0 10*3/uL (ref 0.0–0.1)
ABS. EOSINOPHILS: 0.2 10*3/uL (ref 0.0–0.4)
ABS. IMM. GRANS.: 0 10*3/uL (ref 0.00–0.04)
ABS. LYMPHOCYTES: 1.4 10*3/uL (ref 0.8–3.5)
ABS. MONOCYTES: 0.6 10*3/uL (ref 0.0–1.0)
ABS. NEUTROPHILS: 3.9 10*3/uL (ref 1.8–8.0)
ABSOLUTE NRBC: 0 10*3/uL (ref 0.00–0.01)
BASOPHILS: 1 % (ref 0–1)
EOSINOPHILS: 3 % (ref 0–7)
HCT: 31.1 % — ABNORMAL LOW (ref 35.0–47.0)
HGB: 9.2 g/dL — ABNORMAL LOW (ref 11.5–16.0)
LYMPHOCYTES: 23 % (ref 12–49)
MCHC: 29.6 g/dL — ABNORMAL LOW (ref 30.0–36.5)
MCV: 95.7 FL (ref 80.0–99.0)
MONOCYTES: 10 % (ref 5–13)
MPV: 11.5 FL (ref 8.9–12.9)
NEUTROPHILS: 64 % (ref 32–75)
NRBC: 0 PER 100 WBC
PLATELET: 85 10*3/uL — ABNORMAL LOW (ref 150–400)
RBC: 3.25 M/uL — ABNORMAL LOW (ref 3.80–5.20)
RDW: 15.9 % — ABNORMAL HIGH (ref 11.5–14.5)
WBC: 6.2 10*3/uL (ref 3.6–11.0)

## 2021-05-12 LAB — EKG, 12 LEAD, INITIAL
Atrial Rate: 60 {beats}/min
Calculated P Axis: 61 degrees
Calculated R Axis: -55 degrees
Calculated T Axis: -158 degrees
P-R Interval: 212 ms
Q-T Interval: 526 ms
QRS Duration: 112 ms
QTC Calculation (Bezet): 606 ms
Ventricular Rate: 80 {beats}/min

## 2021-05-12 LAB — SAMPLES BEING HELD

## 2021-05-12 LAB — BASIC METABOLIC PANEL
Anion Gap: NEGATIVE mmol/L (ref 5–15)
BUN: 24 MG/DL — ABNORMAL HIGH (ref 6–20)
Bun/Cre Ratio: 21 — ABNORMAL HIGH (ref 12–20)
CO2: 40 mmol/L — ABNORMAL HIGH (ref 21–32)
Calcium: 8.7 MG/DL (ref 8.5–10.1)
Chloride: 97 mmol/L (ref 97–108)
Creatinine: 1.17 MG/DL — ABNORMAL HIGH (ref 0.55–1.02)
ESTIMATED GLOMERULAR FILTRATION RATE: 52 mL/min/{1.73_m2} — ABNORMAL LOW (ref >60)
Glucose: 136 mg/dL — ABNORMAL HIGH (ref 65–100)
Potassium: 4 mmol/L (ref 3.5–5.1)
Sodium: 136 mmol/L (ref 136–145)

## 2021-05-12 LAB — POCT GLUCOSE
POC Glucose: 149 mg/dL — ABNORMAL HIGH (ref 65–117)
POC Glucose: 170 mg/dL — ABNORMAL HIGH (ref 65–117)
POC Glucose: 187 mg/dL — ABNORMAL HIGH (ref 65–117)
POC Glucose: 126 mg/dL — ABNORMAL HIGH (ref 65–117)

## 2021-05-12 LAB — CBC WITH AUTO DIFFERENTIAL
Basophils %: 1 % (ref 0–1)
Basophils Absolute: 0 10*3/uL (ref 0.0–0.1)
Eosinophils %: 3 % (ref 0–7)
Eosinophils Absolute: 0.2 10*3/uL (ref 0.0–0.4)
Granulocyte Absolute Count: 0 10*3/uL (ref 0.00–0.04)
Hematocrit: 31.1 % — ABNORMAL LOW (ref 35.0–47.0)
Hemoglobin: 9.2 g/dL — ABNORMAL LOW (ref 11.5–16.0)
Immature Granulocytes: 0 % (ref 0.0–0.5)
Lymphocytes %: 23 % (ref 12–49)
Lymphocytes Absolute: 1.4 10*3/uL (ref 0.8–3.5)
MCH: 28.3 PG (ref 26.0–34.0)
MCHC: 29.6 g/dL — ABNORMAL LOW (ref 30.0–36.5)
MCV: 95.7 FL (ref 80.0–99.0)
MPV: 11.5 FL (ref 8.9–12.9)
Monocytes %: 10 % (ref 5–13)
Monocytes Absolute: 0.6 10*3/uL (ref 0.0–1.0)
NRBC Absolute: 0 10*3/uL (ref 0.00–0.01)
Neutrophils %: 64 % (ref 32–75)
Neutrophils Absolute: 3.9 10*3/uL (ref 1.8–8.0)
Nucleated RBCs: 0 PER 100 WBC
Platelets: 85 10*3/uL — ABNORMAL LOW (ref 150–400)
RBC: 3.25 M/uL — ABNORMAL LOW (ref 3.80–5.20)
RDW: 15.9 % — ABNORMAL HIGH (ref 11.5–14.5)
WBC: 6.2 10*3/uL (ref 3.6–11.0)

## 2021-05-12 LAB — EKG 12-LEAD
Atrial Rate: 60 {beats}/min
P Axis: 61 degrees
P-R Interval: 212 ms
Q-T Interval: 526 ms
QRS Duration: 112 ms
QTc Calculation (Bazett): 606 ms
R Axis: -55 degrees
T Axis: -158 degrees
Ventricular Rate: 80 {beats}/min

## 2021-05-12 LAB — PROBNP, N-TERMINAL: BNP: 4979 PG/ML — ABNORMAL HIGH (ref <125)

## 2021-05-12 MED ORDER — MAGNESIUM OXIDE 400 MG TAB
400 mg | Freq: Once | ORAL | Status: AC
Start: 2021-05-12 — End: 2021-05-12
  Administered 2021-05-12: 04:00:00 via ORAL

## 2021-05-12 MED FILL — METOPROLOL SUCCINATE SR 50 MG 24 HR TAB: 50 mg | ORAL | Qty: 1

## 2021-05-12 MED FILL — CARAFATE 1 GRAM TABLET: 1 gram | ORAL | Qty: 1

## 2021-05-12 MED FILL — OXYCODONE 5 MG TAB: 5 mg | ORAL | Qty: 1

## 2021-05-12 MED FILL — LEVOTHYROXINE 150 MCG TAB: 150 mcg | ORAL | Qty: 1

## 2021-05-12 MED FILL — CEFTRIAXONE 2 GRAM SOLUTION FOR INJECTION: 2 gram | INTRAMUSCULAR | Qty: 2

## 2021-05-12 MED FILL — INSULIN LISPRO 100 UNIT/ML INJECTION: 100 unit/mL | SUBCUTANEOUS | Qty: 2

## 2021-05-12 MED FILL — HEPARIN (PORCINE) 5,000 UNIT/ML IJ SOLN: 5000 unit/mL | INTRAMUSCULAR | Qty: 1

## 2021-05-12 MED FILL — ASPIRIN 81 MG CHEWABLE TAB: 81 mg | ORAL | Qty: 1

## 2021-05-12 MED FILL — TRAZODONE 50 MG TAB: 50 mg | ORAL | Qty: 2

## 2021-05-12 MED FILL — ARFORMOTEROL 15 MCG/2 ML NEB SOLUTION: 15 mcg/2 mL | RESPIRATORY_TRACT | Qty: 2

## 2021-05-12 MED FILL — ACETAMINOPHEN 325 MG TABLET: 325 mg | ORAL | Qty: 2

## 2021-05-12 MED FILL — BUDESONIDE 0.5 MG/2 ML NEB SUSPENSION: 0.5 mg/2 mL | RESPIRATORY_TRACT | Qty: 1

## 2021-05-12 MED FILL — MONTELUKAST 10 MG TAB: 10 mg | ORAL | Qty: 1

## 2021-05-12 MED FILL — BUMETANIDE 0.25 MG/ML IJ SOLN: 0.25 mg/mL | INTRAMUSCULAR | Qty: 4

## 2021-05-12 MED FILL — ATORVASTATIN 10 MG TAB: 10 mg | ORAL | Qty: 1

## 2021-05-12 MED FILL — MAGNESIUM OXIDE 400 MG TAB: 400 mg | ORAL | Qty: 1

## 2021-05-12 MED FILL — GLIPIZIDE 5 MG TAB: 5 mg | ORAL | Qty: 1

## 2021-05-12 MED FILL — CITALOPRAM 20 MG TAB: 20 mg | ORAL | Qty: 2

## 2021-05-12 NOTE — Progress Notes (Signed)
This patient was assisted with Intentional Toileting every 2 hours during this shift as appropriate.  Documentation of ambulation and output reflected on Flowsheet as appropriate.  Purposeful hourly rounding was completed using AIDET and 5Ps.  Outcomes of PHR documented as they occurred. Bed alarm in use as appropriate.  Dual Suction and ambubag in place.

## 2021-05-12 NOTE — Progress Notes (Signed)
0700 Bedside and Verbal shift change report given to [facility] RN (oncoming nurse) by Drue Dun RN (offgoing nurse). Report included the following information SBAR, Kardex, ED Summary, Intake/Output, MAR, Recent Results, and Cardiac Rhythm A Paced .     This patient was assisted with Intentional Toileting every 2 hours during this shift as appropriate.  Documentation of ambulation and output reflected on Flowsheet as appropriate.  Purposeful hourly rounding was completed using AIDET and 5Ps.  Outcomes of PHR documented as they occurred. Bed alarm in use as appropriate.  Dual Suction and ambubag in place.     Required Central Line Documentation: delete if NA    Central line Type: PICC   Central Line Insert Date: 04/13/21  Reason Central Line Placed: Long term IV ABX  Central Line Dressing Date:05/07/21  -  Biopatch in place? Yes No: yes  Tubing labeled and appropriate? Yes No: yes  Alcohol caps on all open ports? Yes No: yes  Last CHG bath (time&date):05/10/21 @1615   --  Reviewed with provider and central line must stay in for the following reasons:N/A    1600 Notified Dr. of events overnight with occasional runs of SVT with Chest pain and occasional runs of SVT today without associated CP on this RN's shift. Per Dr. Lorette Ang, upgrade back to telemetry for continued closer monitoring. No further orders at this time.     1725 Received call from monitor tech that Pt had 10 minute episode of SVT. Pt was asymptomatic throughout event. Notified Dr. Lorette Ang and Dr. Lorette Ang, no further orders received at this time. Will continue to monitor.     73 Ok to give Bumex per Dr. 4 with Cardiology. Will continue to montor.     1930 Bedside and Verbal shift change report given to Tisyl RN (oncoming nurse) by Dagoberto Reef RN (offgoing nurse). Report included the following information SBAR, Kardex, ED Summary, Intake/Output, MAR, Recent Results, and Cardiac Rhythm A paced .

## 2021-05-12 NOTE — Progress Notes (Signed)
[place] [facility]  [address], [place], [place] [id number]  [phone]      Hospitalist  Progress Note      NAME:         Deborah LoftDonna [name]   DOB:        [birth year]  MRM:        [id number]    Date of service: 05/12/2021      Subjective: Patient seen and examined by me. Patient admitted with CHF exacerbation. She remains weak. No chest pain. Minimal SOB. No cough or fever.      Objective:    Vital Signs:    Visit Vitals  BP (!) 133/97 (BP 1 Location: Right lower arm, BP Patient Position: At rest)   Pulse 84   Temp 98.3 F (36.8 C)   Resp 17   Ht 5\' 7"  (1.702 m)   Wt (!) 158.9 kg (350 lb 6.4 oz)   SpO2 92%   BMI 54.88 kg/m        Intake/Output Summary (Last 24 hours) at 05/12/2021 1244  Last data filed at 05/12/2021 1230  Gross per 24 hour   Intake 1210 ml   Output 4400 ml   Net -3190 ml        Current inpatient medications reviewed:  Current Facility-Administered Medications   Medication Dose Route Frequency    traZODone (DESYREL) tablet 100 mg  100 mg Oral QHS    oxyCODONE IR (ROXICODONE) tablet 5 mg  5 mg Oral Q6H PRN    metoprolol succinate (TOPROL-XL) XL tablet 50 mg  50 mg Oral DAILY    albuterol-ipratropium (DUO-NEB) 2.5 MG-0.5 MG/3 ML  3 mL Nebulization Q4H PRN    heparin (porcine) injection 5,000 Units  5,000 Units SubCUTAneous Q8H    insulin lispro (HUMALOG) injection   SubCUTAneous AC&HS    glucose chewable tablet 16 g  4 Tablet Oral PRN    glucagon (GLUCAGEN) injection 1 mg  1 mg IntraMUSCular PRN    dextrose 10% infusion 0-250 mL  0-250 mL IntraVENous PRN    arformoteroL (BROVANA) neb solution 15 mcg  15 mcg Nebulization BID RT    budesonide (PULMICORT) 500 mcg/2 ml nebulizer suspension  500 mcg Nebulization BID RT    sodium chloride (NS) flush 5-40 mL  5-40 mL IntraVENous Q8H    sodium chloride (NS) flush 5-40 mL  5-40 mL IntraVENous PRN    acetaminophen (TYLENOL) tablet 650 mg  650 mg Oral Q6H PRN    Or    acetaminophen  (TYLENOL) suppository 650 mg  650 mg Rectal Q6H PRN    polyethylene glycol (MIRALAX) packet 17 g  17 g Oral DAILY PRN    senna (SENOKOT) tablet 8.6 mg  1 Tablet Oral DAILY PRN    promethazine (PHENERGAN) tablet 12.5 mg  12.5 mg Oral Q6H PRN    Or    ondansetron (ZOFRAN) injection 4 mg  4 mg IntraVENous Q6H PRN    bumetanide (BUMEX) injection 1 mg  1 mg IntraVENous BID    cefTRIAXone (ROCEPHIN) 2 g in 0.9% sodium chloride 20 mL IV syringe  2 g IntraVENous Q24H    aspirin chewable tablet 81 mg  81 mg Oral DAILY    atorvastatin (LIPITOR) tablet 10 mg  10 mg Oral DAILY    citalopram (CELEXA) tablet 40 mg  40 mg Oral DAILY    glipiZIDE (GLUCOTROL) tablet 5 mg  5 mg Oral BID WITH MEALS    levothyroxine (SYNTHROID)  tablet 150 mcg  150 mcg Oral ACB    montelukast (SINGULAIR) tablet 10 mg  10 mg Oral QHS    sucralfate (CARAFATE) tablet 1 g  1 g Oral TID       Physical Examination:    General:   Weak and ill, not in distress   Eyes:   pink conjunctivae, PERRLA with no discharge.  ENT:   no ottorrhea or rhinorrhea with dry mucous membranes  Neck: no masses, thyroid non-tender and trachea central.  Pulm:  decreased but clear breath sounds without crackles or wheezes  Card:  has regular and normal S1, S2 without thrills, bruits. ++ peripheral edema  Abd:  Soft, obese, non-distended, normoactive bowel sounds   Musc:  No cyanosis, clubbing, atrophy or deformities.  Skin:  No rashes, bruising or ulcers.   Neuro: Awake and alert. Generally a non focal exam.   Psych:  Has a fair insight to her illness     Diagnostic testing:    Laboratory data reviewed and independently interpreted:    Recent Labs     05/11/21  0014 05/10/21  0237 05/09/21  2014   WBC 7.5 5.0 5.6   HGB 9.5* 8.9* 9.3*   HCT 31.5* 29.6* 30.7*   PLT 95* 77* 83*     Recent Labs     05/12/21  0602 05/11/21  2236 05/11/21  0014 05/10/21  0237 05/09/21  2014   NA 136  --  137 137 140   K 4.0  --  4.4 3.2* 3.3*   CL 97  --  98 98 99   CO2 40*  --  39* 39* 39*   GLU 136*  --   120* 119* 174*   BUN 24*  --  20 17 18    CREA 1.17*  --  1.12* 1.22* 1.36*   CA 8.7  --  9.0 8.7 9.1   MG 1.9 1.7  --  3.0* 1.4*   ALB  --   --   --  2.8* 2.9*   ALT  --   --   --  24 23     No components found for: GLPOC    Cultures, Imaging studies reviewed and reports noted, Telemetry reviewed and independently interpreted by me and available notes from other care providers - all reviewed by me on: 05/12/2021    Assessment and Plan:    Acute on chronic heart failure with preserved ejection fraction (HFpEF) (HCC) (05/09/2021) POA: likely triggered by missed diuretic use. Although her pro-BNP is better and CXR overall unchanged, she did appear symptomatic on admission. Remains overloaded. Echo done 03/2021 showed an EF of 50-55%. Continue IV Bumex. Fluid restriction. Low sodium diet. Cardiology following. CM for discharge planning.      Bacteremia due to group B Streptococcus (05/09/2021) POA: during recent admission, she had GBS bacteremia complicated by pacemaker line vegetation. Seen by cardiology and ID then. Continue IV Ceftriaxone scheduled to run through 05/23/21. Stable     Acute on chronic respiratory failure with hypoxia and hypercapnia (HCC) (05/09/2021) POA: due to the CHF. Continue supplemental oxygen and wean as tolerated to keep sats > 90%. On 2-3 L/min     DM (diabetes mellitus), type 2 with complications POA: last A1c 5.7. Monitor blood glucose. Continue Glucotrol, SSi per protocol       COPD (chronic obstructive pulmonary disease) (HCC) (05/09/2021) POA: not in any exacerbation. Nebs with Brovana, Pulmicort.     Thrombocytopenia (HCC) (04/30/2020) POA: unclear etiology.  Chronic. Prior CT scans showed hepatic steatosis. Stable.      CKD (chronic kidney disease), stage III (HCC) (04/30/2020) POA: monitor renal function with diuresis     Pacemaker / Aortic valve replaced / History of PSVT (paroxysmal supraventricular tachycardia) (05/09/2021) POA: she has a hx of a bioprosthetic AVR. Her pacer is not MRi  compatible. Monitor      Hypothyroidism PO: last TSH was 10 in 10/2020. Repeat is normal. Continue Levothyroxine     BMI 50.0-59.9, adult (HCC) (05/09/2021) /  OSA (obstructive sleep apnea) (05/09/2021) POA: resume BiPAP at night     Anemia (05/09/2021) POA: likely due to chronic illness. Monitor     Anxiety and depression POA: resume Celexa.     Hyperlipidemia POA: continue Lipitor    Care Plan discussed with: Patient, Nursing, CM     Prophylaxis:  Hep SQ                Anticipated Disposition:  SNF/LTC    PCP:      Barrett, Lance Sell, NP     I have personally examined and treated the patient at bedside during this period. To assist coordination of care and communication with nursing and staff, this note may be preliminary early in the day, but finalized by end of the day.         ___________________________________________________    Attending Physician:   Melynda Keller, MD

## 2021-05-12 NOTE — Progress Notes (Signed)
Problem: Heart Failure: Day 2  Goal: Off Pathway (Use only if patient is Off Pathway)  Outcome: Progressing Towards Goal  Goal: Activity/Safety  Outcome: Progressing Towards Goal  Goal: Consults, if ordered  Outcome: Progressing Towards Goal

## 2021-05-12 NOTE — Progress Notes (Signed)
Problem: Heart Failure: Day 2  Goal: Diagnostic Test/Procedures  Outcome: Progressing Towards Goal  Goal: Nutrition/Diet  Outcome: Progressing Towards Goal     Problem: Patient Education: Go to Patient Education Activity  Goal: Patient/Family Education  Outcome: Progressing Towards Goal

## 2021-05-12 NOTE — Progress Notes (Signed)
05/11/21:     1900: Bedside and Verbal shift change report given to Tisyl,RN (oncoming nurse) by Charlies Constable. RN (offgoing nurse). Report included the following information SBAR, Kardex, ED Summary, Procedure Summary, Intake/Output, MAR, Recent Results, Med Rec Status, Cardiac Rhythm  , and Alarm Parameters .     2056: Pt has been having runs of sustaining Vtach. Pt reported of having sharp feeling L sided chest pain when runs of vtach is happening. EKG done 3 times and they showed the following:    Atrial-sensed-ventricular paced. The other one showed Sinus rhythm with frequent atrial paced and premature SVT complexes. The other one showed ventricular paced rhythm. I      This RN called the cardiology. No new orders were made. Continue to monitor as per cardio.    Magnesium drawn. Mg came back 1.7    2156: NP Sabrina informed about this, ordered Magnesium tablet for one dose. Will continue to monitor pt's condition.    05/12/21: 0730: Bedside and Verbal shift change report given to [facility] (Cabin crew) by Darlin Drop (offgoing nurse). Report included the following information SBAR, Kardex, ED Summary, Procedure Summary, Intake/Output, MAR, Recent Results, Med Rec Status, Cardiac Rhythm  , and Alarm Parameters .

## 2021-05-12 NOTE — Progress Notes (Signed)
Progress  Notes by Victorino December, MD at 05/12/21 1140                Author: Victorino December, MD  Service: Cardiology  Author Type: Physician       Filed: 05/12/21 1142  Date of Service: 05/12/21 1140  Status: Signed          Editor: Victorino December, MD (Physician)                                                                                     [facility] CARDIOLOGY                     Cardiology Care Note       [] Initial Encounter      [x] Follow-up        Patient Name: Deborah [name] - DOB:[birth year] - Deborah [name]   Primary Cardiologist: 01/25/1956 Cardiology Physicians: [id number] MD   Consulting Cardiologist: Sondra Barges Cardiology Physicians: Thurston Pounds MD        Reason for encounter: CHF       HPI:         Deborah [name] is a 66 y.o. female with PMH of HF p EF, a fib, possible atrial lead vegetation treated abx, repeat ECHO with no evidence of vegetation with COPD, A/C resp failure who was recently discharged from hospital on  04/24/2021 to [facility] for rehab.       She says that she had not been receiving her Bumex on a consistent basis and missed several days dosing.       She started having a progressively worsening SOB associated with lower extremity swelling. She says she was using oxygen but required supplementation in the ED. She denies any cough or fever. No chills, nausea or vomiting.      Subjective:        Deborah [name] reports she still has dyspnea but somewhat improved.          Assessment and Plan          1 A/C HF p EF: seems as if her diuretics were not being administered at SNF. pBNP is improved and chest xray unchanged.   Cont IV loops as she continues to have fluid overload.       2 Hx a fib s/p PPM (not MRI compatible)/ s/p AVR: ectopy noted on tele, check Mg, K 4.4. Was NOT bradycardic overnight by tele review. Cont metoprolol       3 Bacteremia due to group B Streptococcus: during recent admission, she had GBS bacteremia complicated by pacemaker line vegetation. Continue IV  Ceftriaxone scheduled to run through 05/23/21.       4 Acute on chronic respiratory failure with hypoxia and hypercapnia/COPD: On NC        5 Hypokalemia : replete lytes        6 T 2 DM:        7 CKD:       8 obesity: Body mass index is 54.88 kg/m.           ____________________________________________________________      Cardiac testing  04/08/21      ECHO ADULT FOLLOW-UP OR LIMITED 04/17/2021 04/17/2021      Interpretation Summary   ?  Aortic Valve: Not well visualized. Edwards bovine bioprosthetic valve with a size of 25 mm. Mild regurgitation.  Stenosis of the aortic valve. AV mean gradient is 38 mmHg. AV area by continuity VTI is 1.0 cm2.   ?  Technical qualifiers: Echo study was technically difficult due to patient's body habitus.      Signed by: Candyce Churn, DO on 04/17/2021  5:00 PM         02/16/21      NUCLEAR CARDIAC STRESS TEST 02/20/2021 02/26/2021      Interpretation Summary   ?  Nuclear Findings: LV perfusion is normal. There is no evidence of inducible ischemia.   ?  Nuclear Findings: The defect appears to be caused by breast attenuation.   ?  ECG: Resting ECG demonstrates normal sinus rhythm.   ?  ECG: Stress ECG was negative for ischemia.   ?  Stress Test: A pharmacological stress test was performed using lexiscan. Hemodynamics are adequate for diagnosis. Blood pressure demonstrated  a normal response and heart rate demonstrated a normal response to stress. The patient's heart rate recovery was normal. The patient reported dyspnea and no chest pain during the stress test.      Ventricular bigeminy   Unifocal PVC, RBBB and superior axis PVCs      Signed by: Thurston Pounds, MD on 02/20/2021  5:00 PM      Most recent HS troponins:     Recent Labs            05/10/21   1351  05/09/21   2014         TROPHS  70*  82*           ECG:      EKG Results                  Procedure  720  Value  Units  Date/Time           EKG, 12 LEAD, INITIAL [[id number]]         Order Status: Sent         EKG 12 LEAD INITIAL  [[id number]]  Collected: 05/09/21 2016       Order Status: Completed  Updated: 05/11/21 1349                Ventricular Rate  97  BPM           Atrial Rate  49  BPM           P-R Interval  160  ms           QRS Duration  160  ms           Q-T Interval  350  ms           QTC Calculation (Bezet)  444  ms           Calculated P Axis  67  degrees           Calculated R Axis  -117  degrees           Calculated T Axis  6  degrees                Diagnosis  --             Sinus bradycardia with marked sinus arrhythmia with frequent atrial-paced  complexes and with frequent and consecutive premature ventricular complexes   Right bundle branch block   Possible Lateral infarct , age undetermined   Abnormal ECG   When compared with ECG of 13-Apr-2021 22:14,   premature ventricular complexes are now present   premature atrial complexes are no longer present   Borderline criteria for Lateral infarct are now present   Confirmed by Samauri Kellenberger MD, Alazia Crocket [phone]) on 05/11/2021 1:49:45 PM              EKG 12 LEAD INITIAL [[id number]]  Collected: 05/10/21 0635       Order Status: Completed  Updated: 05/10/21 1547                Ventricular Rate  80  BPM           Atrial Rate  60  BPM           P-R Interval  212  ms           QRS Duration  112  ms           Q-T Interval  526  ms           QTC Calculation (Bezet)  606  ms           Calculated P Axis  61  degrees           Calculated R Axis  -55  degrees           Calculated T Axis  -158  degrees                Diagnosis  --             Atrial-paced rhythm with prolonged AV conduction with frequent premature    ventricular complexes   Incomplete right bundle branch block   Left anterior fascicular block   Septal infarct , age undetermined   T wave abnormality, consider lateral ischemia   Prolonged QT   Abnormal ECG   When compared with ECG of 09-May-2021 20:16,   MANUAL COMPARISON REQUIRED, DATA IS UNCONFIRMED                           Review of Systems:      All other systems reviewed and  all negative except as written in HPI       Patient unable to provide secondary  to condition        Past Medical History:        Diagnosis  Date         ?  (HFpEF) heart failure with preserved ejection fraction (HCC)       ?  Anxiety and depression       ?  Aortic valve replaced            S/p bovine aortic valve replacement.         ?  Asthma       ?  Chronic narcotic use       ?  Chronic obstructive pulmonary disease (HCC)       ?  Chronic pain       ?  CKD (chronic kidney disease), stage III (HCC)            Baseline creatinine is 1.3-1.4 with GFR in the 40s.         ?  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  GERD (gastroesophageal reflux disease)       ?  History of vascular access device  04/13/2021          4 FR Single PICC for LTABX: R cephalic vessell length 48 CM Max P leave @ 1 CM out; Arm circumferenc 40 CM         ?  Hyperlipidemia       ?  Hypothyroidism       ?  Morbid obesity (HCC)       ?  Neuropathy       ?  Obstructive sleep apnea           ?  Rhinitis            Past Surgical History:         Procedure  Laterality  Date          ?  COLONOSCOPY  N/A  12/01/2020          COLONOSCOPY performed by Ian Malkin, MD at [facility] ENDOSCOPY          ?  HX AORTIC VALVE REPLACEMENT              Bovine bioprosthetic          ?  HX PACEMAKER            Social Hx:  reports that she quit smoking about 11 years ago. Her smoking use included cigarettes. She has a 40.00 pack-year smoking history. She has never used smokeless tobacco. She reports that she does not currently use alcohol. She reports that she  does not use drugs.   Family Hx: family history includes Hypertension in her father and mother.     Allergies        Allergen  Reactions         ?  Nitroglycerin  Unknown (comments)             hypotension         ?  Aloe Vera  Rash     ?  Hydrochlorothiazide  Other (comments)             Reports 'kidneys dry up"          ?  Tetanus And Diphther. Tox (Pf)  Swelling             Swelling of arm and it turns  black              OBJECTIVE:     Wt Readings from Last 3 Encounters:        05/12/21  (!) 350 lb 6.4 oz (158.9 kg)     04/24/21  (!) 354 lb 8 oz (160.8 kg)        03/09/21  330 lb (149.7 kg)           Intake/Output Summary (Last 24 hours) at 05/12/2021 1140   Last data filed at 05/12/2021 0818     Gross per 24 hour        Intake  1610 ml        Output  3500 ml        Net  -1890 ml           Physical Exam:      Vitals:      Vitals:             05/12/21 4696  05/12/21 2952  05/12/21 0813  05/12/21 0818  BP:  116/69    104/73  118/61     Pulse:  85    86  85     Resp:      19       Temp:      99 F (37.2 C)       SpO2:    99%  94%       Weight:  (!) 350 lb 6.4 oz (158.9 kg)                 Height:                Telemetry: SR       Gen: Morbidly obese, in no distress    Neck: Supple, No JVD, No Carotid Bruit   Resp: No accessory muscle use, diminished breath sounds, B/L Basal rales are present.    Card: Regular Rate,Rythm, Normal S1, S2, No murmurs, rubs or gallop.    Abd:   obese, Soft, non-tender, non-distended, BS+    MSK: No cyanosis   Skin: No rashes     Neuro: Moving all four extremities, follows commands appropriately   Psych: Fair insight, oriented to person, place, alert, Nml Affect   LE: 3+ edema      Data Review:       Radiology:    XR Results (most recent):   Results from Hospital Encounter encounter on 05/09/21      XR CHEST PORT      Narrative   EXAM:  XR CHEST PORT      INDICATION: Short of breath      COMPARISON: 04/16/2021      TECHNIQUE: portable chest AP view      FINDINGS: A right-sided PICC line is in place. A left subclavian pacemaker is in   place. The patient status post median sternotomy. The heart is moderately   enlarged, but unchanged. There is unchanged diffuse interstitial prominence.. No   pleural effusion or pneumothorax.      Impression   Unchanged moderate cardiomegaly. Unchanged diffuse interstitial   prominence which may be related to edema or atypical (viral) infection.            Recent Labs            05/12/21   0602  05/11/21   0014     NA  136  137     K  4.0  4.4     CL  97  98     CO2  40*  39*     BUN  24*  20     CREA  1.17*  1.12*     GLU  136*  120*         CA  8.7  9.0          Recent Labs            05/11/21   0014  05/10/21   0237     WBC  7.5  5.0     HGB  9.5*  8.9*     HCT  31.5*  29.6*         PLT  95*  77*          Recent Labs            05/10/21   0237  05/09/21   2014         AP  60  61        No results  for input(s): CHOL, LDLC in the last 72 hours.      No lab exists for component: TGL, HDLC,  HBA1C         Current meds:      Current Facility-Administered Medications:    ?  traZODone (DESYREL) tablet 100 mg, 100 mg, Oral, QHS, Jamil, Nora, DO, 100 mg at 05/11/21 2235   ?  oxyCODONE IR (ROXICODONE) tablet 5 mg, 5 mg, Oral, Q6H PRN, Horald Chestnut, DO, 5 mg at 05/11/21 2200   ?  metoprolol succinate (TOPROL-XL) XL tablet 50 mg, 50 mg, Oral, DAILY, Fisher, Georga Hacking, NP, 50 mg at 05/12/21 1610   ?  albuterol-ipratropium (DUO-NEB) 2.5 MG-0.5 MG/3 ML, 3 mL, Nebulization, Q4H PRN, Jamil, Nora, DO, 3 mL at 05/10/21 0700   ?  heparin (porcine) injection 5,000 Units, 5,000 Units, SubCUTAneous, Q8H, Horald Chestnut, DO, 5,000 Units at 05/12/21 9604   ?  insulin lispro (HUMALOG) injection, , SubCUTAneous, AC&HS, Horald Chestnut, DO, 2 Units at 05/12/21 0820   ?  glucose chewable tablet 16 g, 4 Tablet, Oral, PRN, Horald Chestnut, DO   ?  glucagon (GLUCAGEN) injection 1 mg, 1 mg, IntraMUSCular, PRN, Jamil, Arna Medici, DO   ?  dextrose 10% infusion 0-250 mL, 0-250 mL, IntraVENous, PRN, Jamil, Arna Medici, DO   ?  arformoteroL (BROVANA) neb solution 15 mcg, 15 mcg, Nebulization, BID RT, Jamil, Arna Medici, DO, 15 mcg at 05/12/21 5409   ?  budesonide (PULMICORT) 500 mcg/2 ml nebulizer suspension, 500 mcg, Nebulization, BID RT, Horald Chestnut, DO, 500 mcg at 05/12/21 8119   ?  sodium chloride (NS) flush 5-40 mL, 5-40 mL, IntraVENous, Q8H, Melynda Keller, MD, 10 mL at 05/12/21 0548   ?  sodium chloride (NS) flush 5-40  mL, 5-40 mL, IntraVENous, PRN, Melynda Keller, MD   ?  acetaminophen (TYLENOL) tablet 650 mg, 650 mg, Oral, Q6H PRN, 650 mg at 05/12/21 0826 **OR** acetaminophen (TYLENOL) suppository 650 mg, 650 mg, Rectal, Q6H PRN, Melynda Keller, MD   ?  polyethylene glycol (MIRALAX) packet 17 g, 17 g, Oral, DAILY PRN, Melynda Keller, MD   ?  senna (SENOKOT) tablet 8.6 mg, 1 Tablet, Oral, DAILY PRN, Melynda Keller, MD   ?  promethazine (PHENERGAN) tablet 12.5 mg, 12.5 mg, Oral, Q6H PRN **OR** ondansetron (ZOFRAN) injection 4 mg, 4 mg, IntraVENous, Q6H PRN, Melynda Keller, MD   ?  bumetanide (BUMEX) injection 1 mg, 1 mg, IntraVENous, BID, Melynda Keller, MD, 1 mg at 05/12/21 0547   ?  cefTRIAXone (ROCEPHIN) 2 g in 0.9% sodium chloride 20 mL IV syringe, 2 g, IntraVENous, Q24H, Melynda Keller, MD, 2 g at 05/12/21 0820   ?  aspirin chewable tablet 81 mg, 81 mg, Oral, DAILY, Melynda Keller, MD, 81 mg at 05/12/21 1478   ?  atorvastatin (LIPITOR) tablet 10 mg, 10 mg, Oral, DAILY, Melynda Keller, MD, 10 mg at 05/12/21 2956   ?  citalopram (CELEXA) tablet 40 mg, 40 mg, Oral, DAILY, Melynda Keller, MD, 40 mg at 05/12/21 2130   ?  glipiZIDE (GLUCOTROL) tablet 5 mg, 5 mg, Oral, BID WITH MEALS, Melynda Keller, MD, 5 mg at 05/12/21 0820   ?  levothyroxine (SYNTHROID) tablet 150 mcg, 150 mcg, Oral, ACB, Melynda Keller, MD, 150 mcg at 05/12/21 0548   ?  montelukast (SINGULAIR) tablet 10 mg, 10 mg, Oral, QHS, Melynda Keller, MD, 10 mg at 05/11/21 2200   ?  sucralfate (  CARAFATE) tablet 1 g, 1 g, Oral, TID, Melynda Kellermuria, Robert O, MD, 1 g at 05/12/21 [id number]      Victorino DecemberVikas K Tywone Bembenek, MD      [facility]   Call center: ([phone]  [phone]         CC:Barrett, Lance Sellita M, NP

## 2021-05-12 NOTE — Progress Notes (Signed)
Problem: Patient Education: Go to Patient Education Activity  Goal: Patient/Family Education  Outcome: Progressing Towards Goal     Problem: TIA/CVA Stroke: 0-24 hours  Goal: Off Pathway (Use only if patient is Off Pathway)  Outcome: Progressing Towards Goal  Goal: Activity/Safety  Outcome: Progressing Towards Goal  Goal: Consults, if ordered  Outcome: Progressing Towards Goal  Goal: Diagnostic Test/Procedures  Outcome: Progressing Towards Goal

## 2021-05-12 NOTE — Progress Notes (Signed)
Problem: Mobility Impaired (Adult and Pediatric)  Goal: *Acute Goals and Plan of Care (Insert Text)  Description: FUNCTIONAL STATUS PRIOR TO ADMISSION: Patient was modified independent using a single point cane for household and community amb. Recent hospital admission and d/c to SNF [place] x last 2+ weeks then readmit    HOME SUPPORT PRIOR TO ADMISSION: The patient lived with son's family but did not require assist.    Physical Therapy Goals  Initiated 05/12/2021  1.  Patient will move from supine to sit and sit to supine , scoot up and down, and roll side to side in bed with modified independence within 7 day(s).    2.  Patient will transfer from bed to chair and chair to bed with modified independence using the least restrictive device within 7 day(s).  3.  Patient will perform sit to stand with modified independence within 7 day(s).  4.  Patient will ambulate with modified independence for 50 feet with the least restrictive device within 7 day(s).   5.  Patient will ascend/descend 5 stairs with 1 handrail(s) and SPC with modified independence within 7 day(s).   Note:   PHYSICAL THERAPY EVALUATION  Patient: Deborah [name] (66 y.o. female)  Date: 05/12/2021  Primary Diagnosis: Acute on chronic heart failure with preserved ejection fraction (HFpEF) (HCC) [I50.33]       Precautions:   Fall    ASSESSMENT  Based on the objective data described below, the patient presents with edema x extremities, decreased tol for activity, decreased standing balance, and pain BLE joints. ReAdmission 2 days ago for SOB and acute on chronic CHF. BMI almost 55.  Admission end of March for bacteremia and then transferred to SNF in [place] and readmit for SOB. "Patient reports they did not give me enough of my diuretic medicine". Patient with minimal tol for amb at baseline due to OA and pain knees. Patient DOE on 3L O2 and desaturates to 83% with activity but rebounds < minute. Only able to tol short distance amb in room due to DOE and knee  pain. Pending patient's tol for activity and medical stability: d/c to home with HHPT or return to SNF. Patient appears not far from her reported baseline and may be able to d/c to home. She is hopeful for home if medically stable. She declines to return to [place] SNF    Current Level of Function Impacting Discharge (mobility/balance): CG A for short distance  amb with RW. Up in chair    Other factors to consider for discharge: BMI, chronic medical hx including OA pain, sedentary lifestyle     Patient will benefit from skilled therapy intervention to address the above noted impairments.       PLAN :  Recommendations and Planned Interventions: bed mobility training, transfer training, gait training, patient and family training/education, and therapeutic activities      Frequency/Duration: Patient will be followed by physical therapy:  5 times a week to address goals.    Recommendation for discharge: (in order for the patient to meet his/her long term goals)  Physical therapy at least 2 days/week in the home     This discharge recommendation:  Has not yet been discussed the attending provider and/or case management    IF patient discharges home will need the following DME: none         SUBJECTIVE:   Patient stated "I hope I can improve enough to go home this time."    OBJECTIVE DATA SUMMARY:   HISTORY:  Past Medical History:   Diagnosis Date    (HFpEF) heart failure with preserved ejection fraction (HCC)     Anxiety and depression     Aortic valve replaced     S/p bovine aortic valve replacement.    Asthma     Chronic narcotic use     Chronic obstructive pulmonary disease (HCC)     Chronic pain     CKD (chronic kidney disease), stage III (HCC)     Baseline creatinine is 1.3-1.4 with GFR in the 40s.    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)     GERD (gastroesophageal reflux disease)     History of vascular access device 04/13/2021    4 FR Single PICC for LTABX: R cephalic vessell length 48 CM Max P leave @ 1 CM out;  Arm circumferenc 40 CM    Hyperlipidemia     Hypothyroidism     Morbid obesity (HCC)     Neuropathy     Obstructive sleep apnea     Rhinitis      Past Surgical History:   Procedure Laterality Date    COLONOSCOPY N/A 12/01/2020    [facility] Alfred[facility]    HX AORTIC VALVE REPLACEMENT      Bovine bioprosthetic    HX PACEMAKER         Personal factors and/or comorbidities impacting plan of care:     Home Situation  Home Environment: Private residence  # Steps to Enter: 5  Rails to Enter: Yes  Hand Rails : Right  One/Two Story Residence: One story  Living Alone: No  Support Systems: Child(ren)  Patient Expects to be Discharged to:: Skilled nursing facility  Current DME Used/Available at Home: Gilmer Morane, straight, Environmental consultantWalker, rolling, Wheelchair  Tub or Shower Type: Tub/Shower combination    EXAMINATION/PRESENTATION/DECISION MAKING:   Critical Behavior:  Neurologic State: Alert  Orientation Level: Oriented X4  Cognition: Follows commands     Hearing:  Auditory  Auditory Impairment: None  Edema: throughout 4 ext  Range Of Motion:  AROM: Generally decreased, functional                       Strength:    Strength: Generally decreased, functional                    Tone & Sensation:   Tone: Normal           Coordination:  Coordination: Generally decreased, functional  Vision:      Functional Mobility:  Bed Mobility:  Rolling:  (in chair on arrival)           Transfers:  Sit to Stand: Contact guard assistance  Stand to Sit: Contact guard assistance  Stand Pivot Transfers: Contact guard assistance                    Balance:   Sitting: Intact;Without support  Standing: Intact;With support  Ambulation/Gait Training:        Ambulation - Level of Assistance: Contact guard assistance        Gait Abnormalities: Antalgic (bilateral hip and knee pain)        Base of Support: Widened          Stairs - Level of Assistance:  (NT)        Pain Rating:  Joint pain to degree cease activity    Activity Tolerance:    Poor, desaturates with  exertion and requires oxygen, requires frequent rest breaks, and observed SOB with activity BP "soft" 101/50s, 95% on 3L desaturates to 80s% but rebounds quickly, HR stable    After treatment patient left in no apparent distress:   Sitting in chair, Call bell within reach, and Bed / chair alarm activated    COMMUNICATION/EDUCATION:   The patient's plan of care was discussed with: Registered nurse.     Fall prevention education was provided and the patient/caregiver indicated understanding. and Patient/family have participated as able in goal setting and plan of care.    Thank you for this referral.  Maureen Chatters, PT, DPT   Time Calculation: 27 mins

## 2021-05-13 LAB — CBC WITH AUTOMATED DIFF
ABS. BASOPHILS: 0 10*3/uL (ref 0.0–0.1)
ABS. EOSINOPHILS: 0.2 10*3/uL (ref 0.0–0.4)
ABS. IMM. GRANS.: 0 10*3/uL (ref 0.00–0.04)
ABS. LYMPHOCYTES: 1.4 10*3/uL (ref 0.8–3.5)
ABS. MONOCYTES: 0.5 10*3/uL (ref 0.0–1.0)
ABS. NEUTROPHILS: 3.1 10*3/uL (ref 1.8–8.0)
ABSOLUTE NRBC: 0 10*3/uL (ref 0.00–0.01)
BASOPHILS: 0 % (ref 0–1)
EOSINOPHILS: 4 % (ref 0–7)
HCT: 27.8 % — ABNORMAL LOW (ref 35.0–47.0)
HGB: 8.2 g/dL — ABNORMAL LOW (ref 11.5–16.0)
IMMATURE GRANULOCYTES: 0 % (ref 0.0–0.5)
LYMPHOCYTES: 27 % (ref 12–49)
MCH: 28.2 PG (ref 26.0–34.0)
MCHC: 29.5 g/dL — ABNORMAL LOW (ref 30.0–36.5)
MCV: 95.5 FL (ref 80.0–99.0)
MONOCYTES: 9 % (ref 5–13)
MPV: 11.6 FL (ref 8.9–12.9)
NEUTROPHILS: 60 % (ref 32–75)
NRBC: 0 PER 100 WBC
PLATELET: 68 10*3/uL — ABNORMAL LOW (ref 150–400)
RBC: 2.91 M/uL — ABNORMAL LOW (ref 3.80–5.20)
RDW: 15.9 % — ABNORMAL HIGH (ref 11.5–14.5)
WBC: 5.2 10*3/uL (ref 3.6–11.0)

## 2021-05-13 LAB — GLUCOSE, POC
Glucose (POC): 149 mg/dL — ABNORMAL HIGH (ref 65–117)
Glucose (POC): 122 mg/dL — ABNORMAL HIGH (ref 65–117)
Glucose (POC): 167 mg/dL — ABNORMAL HIGH (ref 65–117)
Glucose (POC): 121 mg/dL — ABNORMAL HIGH (ref 65–117)

## 2021-05-13 LAB — METABOLIC PANEL, BASIC
Anion gap: NEGATIVE mmol/L (ref 5–15)
BUN/Creatinine ratio: 22 — ABNORMAL HIGH (ref 12–20)
BUN: 22 MG/DL — ABNORMAL HIGH (ref 6–20)
CO2: 39 mmol/L — ABNORMAL HIGH (ref 21–32)
Calcium: 8 MG/DL — ABNORMAL LOW (ref 8.5–10.1)
Chloride: 100 mmol/L (ref 97–108)
Creatinine: 0.98 MG/DL (ref 0.55–1.02)
Glucose: 129 mg/dL — ABNORMAL HIGH (ref 65–100)
Potassium: 3.5 mmol/L (ref 3.5–5.1)
Sodium: 138 mmol/L (ref 136–145)
eGFR: >60 mL/min/{1.73_m2} (ref >60)

## 2021-05-13 LAB — MAGNESIUM
Magnesium: 1.4 mg/dL — ABNORMAL LOW (ref 1.6–2.4)
Magnesium: 1.4 mg/dL — ABNORMAL LOW (ref 1.6–2.4)

## 2021-05-13 LAB — BASIC METABOLIC PANEL
Anion Gap: NEGATIVE mmol/L (ref 5–15)
BUN: 22 MG/DL — ABNORMAL HIGH (ref 6–20)
Bun/Cre Ratio: 22 — ABNORMAL HIGH (ref 12–20)
CO2: 39 mmol/L — ABNORMAL HIGH (ref 21–32)
Calcium: 8 MG/DL — ABNORMAL LOW (ref 8.5–10.1)
Chloride: 100 mmol/L (ref 97–108)
Creatinine: 0.98 MG/DL (ref 0.55–1.02)
ESTIMATED GLOMERULAR FILTRATION RATE: >60 mL/min/{1.73_m2} (ref >60)
Glucose: 129 mg/dL — ABNORMAL HIGH (ref 65–100)
Potassium: 3.5 mmol/L (ref 3.5–5.1)
Sodium: 138 mmol/L (ref 136–145)

## 2021-05-13 LAB — CBC WITH AUTO DIFFERENTIAL
Basophils %: 0 % (ref 0–1)
Basophils Absolute: 0 10*3/uL (ref 0.0–0.1)
Eosinophils %: 4 % (ref 0–7)
Eosinophils Absolute: 0.2 10*3/uL (ref 0.0–0.4)
Granulocyte Absolute Count: 0 10*3/uL (ref 0.00–0.04)
Hematocrit: 27.8 % — ABNORMAL LOW (ref 35.0–47.0)
Hemoglobin: 8.2 g/dL — ABNORMAL LOW (ref 11.5–16.0)
Immature Granulocytes: 0 % (ref 0.0–0.5)
Lymphocytes %: 27 % (ref 12–49)
Lymphocytes Absolute: 1.4 10*3/uL (ref 0.8–3.5)
MCH: 28.2 PG (ref 26.0–34.0)
MCHC: 29.5 g/dL — ABNORMAL LOW (ref 30.0–36.5)
MCV: 95.5 FL (ref 80.0–99.0)
MPV: 11.6 FL (ref 8.9–12.9)
Monocytes %: 9 % (ref 5–13)
Monocytes Absolute: 0.5 10*3/uL (ref 0.0–1.0)
NRBC Absolute: 0 10*3/uL (ref 0.00–0.01)
Neutrophils %: 60 % (ref 32–75)
Neutrophils Absolute: 3.1 10*3/uL (ref 1.8–8.0)
Nucleated RBCs: 0 PER 100 WBC
Platelets: 68 10*3/uL — ABNORMAL LOW (ref 150–400)
RBC: 2.91 M/uL — ABNORMAL LOW (ref 3.80–5.20)
RDW: 15.9 % — ABNORMAL HIGH (ref 11.5–14.5)
WBC: 5.2 10*3/uL (ref 3.6–11.0)

## 2021-05-13 LAB — POCT GLUCOSE
POC Glucose: 149 mg/dL — ABNORMAL HIGH (ref 65–117)
POC Glucose: 122 mg/dL — ABNORMAL HIGH (ref 65–117)
POC Glucose: 167 mg/dL — ABNORMAL HIGH (ref 65–117)
POC Glucose: 121 mg/dL — ABNORMAL HIGH (ref 65–117)

## 2021-05-13 MED ORDER — MAGNESIUM SULFATE 2 GRAM/50 ML IVPB
2 gram/50 mL (4 %) | Freq: Once | INTRAVENOUS | Status: AC
Start: 2021-05-13 — End: 2021-05-13
  Administered 2021-05-13: 08:00:00 via INTRAVENOUS

## 2021-05-13 MED ORDER — NYSTATIN 100,000 UNIT/G TOPICAL CREAM
100000 unit/gram | Freq: Two times a day (BID) | CUTANEOUS | Status: AC
Start: 2021-05-13 — End: 2021-05-26
  Administered 2021-05-13 – 2021-05-26 (×24): via TOPICAL

## 2021-05-13 MED ORDER — MAGNESIUM SULFATE 2 GRAM/50 ML IVPB
2 gram/50 mL (4 %) | Freq: Once | INTRAVENOUS | Status: AC
Start: 2021-05-13 — End: 2021-05-13
  Administered 2021-05-13: 13:00:00 via INTRAVENOUS

## 2021-05-13 MED FILL — BUMETANIDE 0.25 MG/ML IJ SOLN: 0.25 mg/mL | INTRAMUSCULAR | Qty: 4

## 2021-05-13 MED FILL — CEFTRIAXONE 2 GRAM SOLUTION FOR INJECTION: 2 gram | INTRAMUSCULAR | Qty: 2

## 2021-05-13 MED FILL — IPRATROPIUM-ALBUTEROL 2.5 MG-0.5 MG/3 ML NEB SOLUTION: 2.5 mg-0.5 mg/3 ml | RESPIRATORY_TRACT | Qty: 3

## 2021-05-13 MED FILL — INSULIN LISPRO 100 UNIT/ML INJECTION: 100 unit/mL | SUBCUTANEOUS | Qty: 1

## 2021-05-13 MED FILL — HEPARIN (PORCINE) 5,000 UNIT/ML IJ SOLN: 5000 unit/mL | INTRAMUSCULAR | Qty: 1

## 2021-05-13 MED FILL — GLIPIZIDE 5 MG TAB: 5 mg | ORAL | Qty: 1

## 2021-05-13 MED FILL — MAGNESIUM SULFATE 2 GRAM/50 ML IVPB: 2 gram/50 mL (4 %) | INTRAVENOUS | Qty: 50

## 2021-05-13 MED FILL — TRAZODONE 50 MG TAB: 50 mg | ORAL | Qty: 2

## 2021-05-13 MED FILL — ARFORMOTEROL 15 MCG/2 ML NEB SOLUTION: 15 mcg/2 mL | RESPIRATORY_TRACT | Qty: 2

## 2021-05-13 MED FILL — OXYCODONE 5 MG TAB: 5 mg | ORAL | Qty: 1

## 2021-05-13 MED FILL — LEVOTHYROXINE 150 MCG TAB: 150 mcg | ORAL | Qty: 1

## 2021-05-13 MED FILL — CARAFATE 1 GRAM TABLET: 1 gram | ORAL | Qty: 1

## 2021-05-13 MED FILL — ATORVASTATIN 10 MG TAB: 10 mg | ORAL | Qty: 1

## 2021-05-13 MED FILL — ASPIRIN 81 MG CHEWABLE TAB: 81 mg | ORAL | Qty: 1

## 2021-05-13 MED FILL — METOPROLOL SUCCINATE SR 50 MG 24 HR TAB: 50 mg | ORAL | Qty: 1

## 2021-05-13 MED FILL — NYSTATIN 100,000 UNIT/G TOPICAL CREAM: 100000 unit/gram | CUTANEOUS | Qty: 15

## 2021-05-13 MED FILL — BUDESONIDE 0.5 MG/2 ML NEB SUSPENSION: 0.5 mg/2 mL | RESPIRATORY_TRACT | Qty: 1

## 2021-05-13 MED FILL — CITALOPRAM 20 MG TAB: 20 mg | ORAL | Qty: 2

## 2021-05-13 MED FILL — MONTELUKAST 10 MG TAB: 10 mg | ORAL | Qty: 1

## 2021-05-13 NOTE — Progress Notes (Signed)
Progress  Notes by Victorino December, MD at 05/13/21 1521                Author: Victorino December, MD  Service: Cardiology  Author Type: Physician       Filed: 05/13/21 1523  Date of Service: 05/13/21 1521  Status: Signed          Editor: Victorino December, MD (Physician)                                                                                     [facility]                     Cardiology Care Note       [] Initial Encounter      [x] Follow-up        Patient Name: Deborah [name] - DOB:Jul 26, 1955 - ZOX:[id number]   Primary Cardiologist: [facility] Physicians: Thurston Pounds MD   Consulting Cardiologist: [facility] Physicians: Drue Second MD        Reason for encounter: CHF       HPI:         Klynn Linnemann is a 66 y.o. female with PMH of HF p EF, a fib, possible atrial lead vegetation treated abx, repeat ECHO with no evidence of vegetation with COPD, A/C resp failure who was recently discharged from hospital on  04/24/2021 to [facility] for rehab.       She says that she had not been receiving her Bumex on a consistent basis and missed several days dosing.       She started having a progressively worsening SOB associated with lower extremity swelling. She says she was using oxygen but required supplementation in the ED. She denies any cough or fever. No chills, nausea or vomiting.      Subjective:        Onesty Clair reports she still has dyspnea but somewhat improved.          Assessment and Plan          1 A/C HF p EF: seems as if her diuretics were not being administered at SNF. pBNP is improved and chest xray unchanged.   Cont IV loops as she continues to have fluid overload.       2 Hx a fib s/p PPM (not MRI compatible)/ s/p AVR: ectopy noted on tele.  Has had bigemny and intermittent episodes of SVT with Pacer tracking at 150 bpm. Cont metoprolol       3 Bacteremia due to group B Streptococcus: during recent admission, she had GBS bacteremia complicated by pacemaker line  vegetation. Continue IV Ceftriaxone scheduled to run through 05/23/21.       4 Acute on chronic respiratory failure with hypoxia and hypercapnia/COPD: On NC        5 Hypokalemia : replete lytes        6 T 2 DM:        7 CKD:       8 obesity: Body mass index is 54.41 kg/m.           ____________________________________________________________  Cardiac testing   04/08/21      ECHO ADULT FOLLOW-UP OR LIMITED 04/17/2021 04/17/2021      Interpretation Summary   ?  Aortic Valve: Not well visualized. Edwards bovine bioprosthetic valve with a size of 25 mm. Mild regurgitation.  Stenosis of the aortic valve. AV mean gradient is 38 mmHg. AV area by continuity VTI is 1.0 cm2.   ?  Technical qualifiers: Echo study was technically difficult due to patient's body habitus.      Signed by: Candyce Churn, DO on 04/17/2021  5:00 PM         02/16/21      NUCLEAR CARDIAC STRESS TEST 02/20/2021 02/26/2021      Interpretation Summary   ?  Nuclear Findings: LV perfusion is normal. There is no evidence of inducible ischemia.   ?  Nuclear Findings: The defect appears to be caused by breast attenuation.   ?  ECG: Resting ECG demonstrates normal sinus rhythm.   ?  ECG: Stress ECG was negative for ischemia.   ?  Stress Test: A pharmacological stress test was performed using lexiscan. Hemodynamics are adequate for diagnosis. Blood pressure demonstrated  a normal response and heart rate demonstrated a normal response to stress. The patient's heart rate recovery was normal. The patient reported dyspnea and no chest pain during the stress test.      Ventricular bigeminy   Unifocal PVC, RBBB and superior axis PVCs      Signed by: Thurston Pounds, MD on 02/20/2021  5:00 PM      Most recent HS troponins:   No results for input(s): TROPHS in the last 72 hours.      No lab exists for component:  CKMB         ECG:      EKG Results                  Procedure  720  Value  Units  Date/Time           EKG 12 LEAD INITIAL [[id number]]  Collected: 05/10/21 0635        Order Status: Completed  Updated: 05/12/21 1304                Ventricular Rate  80  BPM           Atrial Rate  60  BPM           P-R Interval  212  ms           QRS Duration  112  ms           Q-T Interval  526  ms           QTC Calculation (Bezet)  606  ms           Calculated P Axis  61  degrees           Calculated R Axis  -55  degrees           Calculated T Axis  -158  degrees                Diagnosis  --             Atrial-paced rhythm with prolonged AV conduction with frequent premature    ventricular complexes   Incomplete right bundle branch block   Left anterior fascicular block   Septal infarct , age undetermined   T wave abnormality, consider lateral ischemia   Prolonged QT  Abnormal ECG   When compared with ECG of 09-May-2021 20:16,   No significant change      Confirmed by Jamison Yuhasz MD, Annis Lagoy [phone]) on 05/12/2021 1:04:16 PM              EKG, 12 LEAD, INITIAL [[id number]]         Order Status: Sent         EKG 12 LEAD INITIAL [[id number]]  Collected: 05/09/21 2016       Order Status: Completed  Updated: 05/11/21 1349                Ventricular Rate  97  BPM           Atrial Rate  49  BPM           P-R Interval  160  ms           QRS Duration  160  ms           Q-T Interval  350  ms           QTC Calculation (Bezet)  444  ms           Calculated P Axis  67  degrees           Calculated R Axis  -117  degrees           Calculated T Axis  6  degrees                Diagnosis  --             Sinus bradycardia with marked sinus arrhythmia with frequent atrial-paced    complexes and with frequent and consecutive premature ventricular complexes   Right bundle branch block   Possible Lateral infarct , age undetermined   Abnormal ECG   When compared with ECG of 13-Apr-2021 22:14,   premature ventricular complexes are now present   premature atrial complexes are no longer present   Borderline criteria for Lateral infarct are now present   Confirmed by Mikey Maffett MD, Jamyra Zweig ([id number]) on 05/11/2021 1:49:45 PM                            Review of Systems:      All other systems reviewed and all negative except as written in HPI       Patient unable to provide secondary  to condition        Past Medical History:        Diagnosis  Date         ?  (HFpEF) heart failure with preserved ejection fraction (HCC)       ?  Anxiety and depression       ?  Aortic valve replaced            S/p bovine aortic valve replacement.         ?  Asthma       ?  Chronic narcotic use       ?  Chronic obstructive pulmonary disease (HCC)       ?  Chronic pain       ?  CKD (chronic kidney disease), stage III (HCC)            Baseline creatinine is 1.3-1.4 with GFR in the 40s.         ?  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  GERD (gastroesophageal reflux disease)       ?  History of vascular access device  04/13/2021          4 FR Single PICC for LTABX: R cephalic vessell length 48 CM Max P leave @ 1 CM out; Arm circumferenc 40 CM         ?  Hyperlipidemia       ?  Hypothyroidism       ?  Morbid obesity (HCC)       ?  Neuropathy       ?  Obstructive sleep apnea           ?  Rhinitis            Past Surgical History:         Procedure  Laterality  Date          ?  COLONOSCOPY  N/A  12/01/2020          COLONOSCOPY performed by Ian Malkin, MD at [facility] ENDOSCOPY          ?  HX AORTIC VALVE REPLACEMENT              Bovine bioprosthetic          ?  HX PACEMAKER            Social Hx:  reports that she quit smoking about 11 years ago. Her smoking use included cigarettes. She has a 40.00 pack-year smoking history. She has never used smokeless tobacco. She reports that she does not currently use alcohol. She reports that she  does not use drugs.   Family Hx: family history includes Hypertension in her father and mother.     Allergies        Allergen  Reactions         ?  Nitroglycerin  Unknown (comments)             hypotension         ?  Aloe Vera  Rash     ?  Hydrochlorothiazide  Other (comments)             Reports 'kidneys dry up"          ?  Tetanus And  Diphther. Tox (Pf)  Swelling             Swelling of arm and it turns black              OBJECTIVE:     Wt Readings from Last 3 Encounters:        05/13/21  347 lb 6.4 oz (157.6 kg)     04/24/21  (!) 354 lb 8 oz (160.8 kg)        03/09/21  330 lb (149.7 kg)           Intake/Output Summary (Last 24 hours) at 05/13/2021 1521   Last data filed at 05/12/2021 2158     Gross per 24 hour        Intake  480 ml        Output  1600 ml        Net  -1120 ml           Physical Exam:      Vitals:      Vitals:             05/13/21 0700  05/13/21 0719  05/13/21 0800  05/13/21 1130  BP:      94/78  (!) 98/52     Pulse:  86    85  84     Resp:      20  17     Temp:      98.5 F (36.9 C)  98.6 F (37 C)     SpO2:    95%  93%  95%     Weight:                   Height:                Telemetry: SR       Gen: Morbidly obese, in no distress    Neck: Supple, No JVD, No Carotid Bruit   Resp: No accessory muscle use, diminished breath sounds, B/L Basal rales are present.    Card: Regular Rate,Rythm, Normal S1, S2, No murmurs, rubs or gallop.    Abd:   obese, Soft, non-tender, non-distended, BS+    MSK: No cyanosis   Skin: No rashes     Neuro: Moving all four extremities, follows commands appropriately   Psych: Fair insight, oriented to person, place, alert, Nml Affect   LE: 3+ edema      Data Review:       Radiology:    XR Results (most recent):   Results from Hospital Encounter encounter on 05/09/21      XR CHEST PORT      Narrative   EXAM:  XR CHEST PORT      INDICATION: Short of breath      COMPARISON: 04/16/2021      TECHNIQUE: portable chest AP view      FINDINGS: A right-sided PICC line is in place. A left subclavian pacemaker is in   place. The patient status post median sternotomy. The heart is moderately   enlarged, but unchanged. There is unchanged diffuse interstitial prominence.. No   pleural effusion or pneumothorax.      Impression   Unchanged moderate cardiomegaly. Unchanged diffuse interstitial   prominence which may  be related to edema or atypical (viral) infection.           Recent Labs            05/13/21   0227  05/12/21   0602     NA  138  136     K  3.5  4.0     CL  100  97     CO2  39*  40*     BUN  22*  24*     CREA  0.98  1.17*     GLU  129*  136*         CA  8.0*  8.7          Recent Labs            05/13/21   0227  05/12/21   1730     WBC  5.2  6.2     HGB  8.2*  9.2*     HCT  27.8*  31.1*         PLT  68*  85*        No results for input(s): PTP, INR, AP, INREXT, INREXT in the last 72 hours.      No lab exists for component: PTTP, GPT, SGOT      No results for input(s): CHOL, LDLC in the last 72 hours.  No lab exists for component: TGL, HDLC,  HBA1C         Current meds:      Current Facility-Administered Medications:    ?  nystatin (MYCOSTATIN) 100,000 unit/gram cream, , Topical, BID, Marca AnconaEdwards, Sabrina C, NP, Given at 05/13/21 0831   ?  traZODone (DESYREL) tablet 100 mg, 100 mg, Oral, QHS, Horald ChestnutJamil, Nora, DO, 100 mg at 05/12/21 2317   ?  oxyCODONE IR (ROXICODONE) tablet 5 mg, 5 mg, Oral, Q6H PRN, Horald ChestnutJamil, Nora, DO, 5 mg at 05/13/21 1155   ?  metoprolol succinate (TOPROL-XL) XL tablet 50 mg, 50 mg, Oral, DAILY, Fisher, Georga HackingSusan M, NP, 50 mg at 05/13/21 0831   ?  albuterol-ipratropium (DUO-NEB) 2.5 MG-0.5 MG/3 ML, 3 mL, Nebulization, Q4H PRN, Jamil, Nora, DO, 3 mL at 05/10/21 0700   ?  heparin (porcine) injection 5,000 Units, 5,000 Units, SubCUTAneous, Q8H, Jamil, Arna Mediciora, DO, 5,000 Units at 05/13/21 0830   ?  insulin lispro (HUMALOG) injection, , SubCUTAneous, AC&HS, Horald ChestnutJamil, Nora, DO, 2 Units at 05/13/21 1142   ?  glucose chewable tablet 16 g, 4 Tablet, Oral, PRN, Horald ChestnutJamil, Nora, DO   ?  glucagon (GLUCAGEN) injection 1 mg, 1 mg, IntraMUSCular, PRN, Jamil, Arna Mediciora, DO   ?  dextrose 10% infusion 0-250 mL, 0-250 mL, IntraVENous, PRN, Jamil, Arna Mediciora, DO   ?  arformoteroL (BROVANA) neb solution 15 mcg, 15 mcg, Nebulization, BID RT, Jamil, Arna Mediciora, DO, 15 mcg at 05/13/21 0717   ?  budesonide (PULMICORT) 500 mcg/2 ml nebulizer suspension, 500  mcg, Nebulization, BID RT, Jamil, Nora, DO, 500 mcg at 05/13/21 [id number]   ?  sodium chloride (NS) flush 5-40 mL, 5-40 mL, IntraVENous, Q8H, Melynda Kellermuria, Robert O, MD, 10 mL at 05/13/21 0829   ?  sodium chloride (NS) flush 5-40 mL, 5-40 mL, IntraVENous, PRN, Melynda Kellermuria, Robert O, MD   ?  acetaminophen (TYLENOL) tablet 650 mg, 650 mg, Oral, Q6H PRN, 650 mg at 05/12/21 0826 **OR** acetaminophen (TYLENOL) suppository 650 mg, 650 mg, Rectal, Q6H PRN, Melynda Kellermuria, Robert O, MD   ?  polyethylene glycol (MIRALAX) packet 17 g, 17 g, Oral, DAILY PRN, Melynda Kellermuria, Robert O, MD   ?  senna (SENOKOT) tablet 8.6 mg, 1 Tablet, Oral, DAILY PRN, Melynda Kellermuria, Robert O, MD   ?  promethazine (PHENERGAN) tablet 12.5 mg, 12.5 mg, Oral, Q6H PRN **OR** ondansetron (ZOFRAN) injection 4 mg, 4 mg, IntraVENous, Q6H PRN, Melynda Kellermuria, Robert O, MD   ?  bumetanide (BUMEX) injection 1 mg, 1 mg, IntraVENous, BID, Melynda Kellermuria, Robert O, MD, 1 mg at 05/12/21 1802   ?  cefTRIAXone (ROCEPHIN) 2 g in 0.9% sodium chloride 20 mL IV syringe, 2 g, IntraVENous, Q24H, Melynda Kellermuria, Robert O, MD, 2 g at 05/13/21 0830   ?  aspirin chewable tablet 81 mg, 81 mg, Oral, DAILY, Melynda Kellermuria, Robert O, MD, 81 mg at 05/13/21 0831   ?  atorvastatin (LIPITOR) tablet 10 mg, 10 mg, Oral, DAILY, Melynda Kellermuria, Robert O, MD, 10 mg at 05/13/21 0831   ?  citalopram (CELEXA) tablet 40 mg, 40 mg, Oral, DAILY, Melynda Kellermuria, Robert O, MD, 40 mg at 05/13/21 0831   ?  glipiZIDE (GLUCOTROL) tablet 5 mg, 5 mg, Oral, BID WITH MEALS, Melynda Kellermuria, Robert O, MD, 5 mg at 05/13/21 0831   ?  levothyroxine (SYNTHROID) tablet 150 mcg, 150 mcg, Oral, ACB, Melynda Kellermuria, Robert O, MD, 150 mcg at 05/13/21 0630   ?  montelukast (SINGULAIR) tablet 10 mg, 10 mg, Oral, QHS, Melynda Kellermuria, Robert O, MD, 10 mg at 05/12/21  2317   ?  sucralfate (CARAFATE) tablet 1 g, 1 g, Oral, TID, Melynda Keller, MD, 1 g at 05/13/21 0831      Victorino December, MD      [facility]   Call center: (P) [phone]  (F) [phone]         CC:Barrett, Lance Sell, NP

## 2021-05-13 NOTE — Progress Notes (Signed)
Problem: Heart Failure: Day 3  Goal: Off Pathway (Use only if patient is Off Pathway)  Outcome: Progressing Towards Goal  Goal: Activity/Safety  Outcome: Progressing Towards Goal  Goal: Diagnostic Test/Procedures  Outcome: Progressing Towards Goal

## 2021-05-13 NOTE — Progress Notes (Signed)
Noted in [place] that [place] [employer] has declined patient.      Spoke with [facility] of [facility] and at this time they do not have beds, have not declined, as it is more an issue of capacity rather than approval. They anticipate a bed mid week and CM reminded her that it will take that long to get auth and suggested that, if family agrees, and they accept patient that an auth could get started. Message received that they cannot offer a bed to patient.      [facility] as well as [facility] are out of network with this insurance so there would be a cost to the patient, they declined in [employer].     Met with patient who agreed to add [facility] in [place] to the referral list as her daughter lives in [place]. Referral added in [employer].     Transition of Care  RUR- 28%  1- CM following for SNF placement  2- Referrals pending.   3- Transportation TBD.

## 2021-05-13 NOTE — Progress Notes (Signed)
Problem: Pressure Injury - Risk of  Goal: *Prevention of pressure injury  Description: Document Braden Scale and appropriate interventions in the flowsheet.  Outcome: Progressing Towards Goal  Note: Pressure Injury Interventions:  Sensory Interventions: Discuss PT/OT consult with provider, Keep linens dry and wrinkle-free    Moisture Interventions: Absorbent underpads    Activity Interventions: Increase time out of bed    Mobility Interventions: Chair cushion    Nutrition Interventions: Document food/fluid/supplement intake    Friction and Shear Interventions: Apply protective barrier, creams and emollients                Problem: Falls - Risk of  Goal: *Absence of Falls  Description: Document Schmid Fall Risk and appropriate interventions in the flowsheet.  Outcome: Progressing Towards Goal  Note: Fall Risk Interventions:                                Problem: Breathing Pattern - Ineffective  Goal: *Absence of hypoxia  Outcome: Progressing Towards Goal  Goal: *Use of effective breathing techniques  Outcome: Progressing Towards Goal

## 2021-05-13 NOTE — Progress Notes (Signed)
Problem: Self Care Deficits Care Plan (Adult)  Goal: *Acute Goals and Plan of Care (Insert Text)  Description: FUNCTIONAL STATUS PRIOR TO ADMISSION: Patient was modified independent using a single point cane for functional mobility. She reports able to care for self prior to recent hospitalization and rehab stay.    HOME SUPPORT: The patient lived with son and daughter in law but did not require assist.    Occupational Therapy Goals  Initiated 05/13/2021  1.  Patient will perform lower body dressing with modified independence within 7 day(s).  2.  Patient will perform lower body dressing with modified independence within 7 day(s).  3.  Patient will perform toilet transfers with modified independence within 7 day(s).  4.  Patient will perform all aspects of toileting with modified independence within 7 day(s).  5.  Patient will participate in upper extremity therapeutic exercise/activities with supervision/set-up for 10 minutes within 7 day(s).    6.  Patient will utilize energy conservation techniques during functional activities with verbal cues within 7 day(s).    Outcome: Progressing Towards Goal   OCCUPATIONAL THERAPY EVALUATION  Patient: Deborah [name] ([age] y.o. female)  Date: 05/13/2021  Primary Diagnosis: Acute on chronic heart failure with preserved ejection fraction (HFpEF) (HCC) [I50.33]       Precautions:   Fall       ASSESSMENT  Based on the objective data described below, the patient presents with decreased activity tolerance,  pain in bilateral LE joint pain and decreased endurance following admission for heart  failure after presenting to hospital for SBO. Patient received in chair, agreeable to activity.  She requires increased time to complete tasks.  She reports only short distance ambulation at this time and manages transfer to commode in bathroom.  Patient performs toileting tasks with SBA, and able to reach successfully to peri area without assistance.  Patient desaturates with activity on 3L  but  recovers with rest break in sitting. Patient hopeful to return home at discharge.    Current Level of Function Impacting Discharge (ADLs/self-care): min assist to SBA    Functional Outcome Measure:  The patient scored  on the AM PAC outcome measure.      Other factors to consider for discharge: lives alone     Patient will benefit from skilled therapy intervention to address the above noted impairments.       PLAN :  Recommendations and Planned Interventions: self care training, functional mobility training, therapeutic exercise, balance training, therapeutic activities, endurance activities, patient education, home safety training, and family training/education    Frequency/Duration: Patient will be followed by occupational therapy 5 times a week to address goals.    Recommendation for discharge: (in order for the patient to meet his/her long term goals)  Occupational therapy at least 2 days/week in the home     This discharge recommendation:  Has been made in collaboration with the attending provider and/or case management    IF patient discharges home will need the following DME:        SUBJECTIVE:   Patient stated "My knees bother me anyway."    OBJECTIVE DATA SUMMARY:   HISTORY:   Past Medical History:   Diagnosis Date    (HFpEF) heart failure with preserved ejection fraction (HCC)     Anxiety and depression     Aortic valve replaced     S/p bovine aortic valve replacement.    Asthma     Chronic narcotic use     Chronic obstructive pulmonary  disease (HCC)     Chronic pain     CKD (chronic kidney disease), stage III (HCC)     Baseline creatinine is 1.3-1.4 with GFR in the 40s.    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)     GERD (gastroesophageal reflux disease)     History of vascular access device 04/13/2021    4 FR Single PICC for LTABX: R cephalic vessell length 48 CM Max P leave @ 1 CM out; Arm circumferenc 40 CM    Hyperlipidemia     Hypothyroidism     Morbid obesity (HCC)     Neuropathy     Obstructive sleep  apnea     Rhinitis      Past Surgical History:   Procedure Laterality Date    COLONOSCOPY N/A 12/01/2020    COLONOSCOPY performed by Ian Malkin, MD at [facility] ENDOSCOPY    HX AORTIC VALVE REPLACEMENT      Bovine bioprosthetic    HX PACEMAKER         Expanded or extensive additional review of patient history:     Home Situation  Home Environment: Private residence  # Steps to Enter: 5  Rails to Enter: Yes  Hand Rails : Right  One/Two Story Residence: One story  Living Alone: No  Support Systems: Child(ren)  Patient Expects to be Discharged to:: Skilled nursing facility  Current DME Used/Available at Home: Gilmer Mor, straight, Environmental consultant, rolling, Wheelchair  Tub or Shower Type: Tub/Shower combination    Hand dominance: Right    EXAMINATION OF PERFORMANCE DEFICITS:  Cognitive/Behavioral Status:  Neurologic State: Alert  Orientation Level: Oriented X4  Cognition: Appropriate decision making;Follows commands  Perception: Appears intact  Perseveration: No perseveration noted  Safety/Judgement: Awareness of environment    Skin: intact as seen   Edema: LEs     Hearing:  Auditory  Auditory Impairment: None    Vision/Perceptual:    Tracking: Able to track stimulus in all quadrants w/o difficulty                                Range of Motion:  AROM: Within functional limits  PROM: Within functional limits                      Strength:      Strength: Within functional limits                Coordination:  Coordination: Within functional limits  Fine Motor Skills-Upper: Left Intact;Right Intact    Gross Motor Skills-Upper: Left Intact;Right Intact    Tone & Sensation:      Tone: Normal  Sensation: Intact                      Balance:  Sitting: Intact  Standing: Intact;With support    Functional Mobility and Transfers for ADLs:  Bed Mobility:       Transfers:  Sit to Stand: Stand-by assistance  Stand to Sit: Contact guard assistance  Bathroom Mobility: Contact guard assistance  Toilet Transfer : Contact guard assistance  Assistive Device :  Walker, rolling    ADL Assessment:  Feeding: Independent    Oral Facial Hygiene/Grooming: Setup    Bathing: Contact guard assistance;Additional time         Upper Body Dressing: Stand-by assistance    Lower Body Dressing: Minimum assistance    Toileting: Contact guard assistance  ADL Intervention and task modifications:                                          Cognitive Retraining  Safety/Judgement: Awareness of environment      Therapeutic Exercise:       Functional Measure:  [employer] AM-PAC      Daily Activity Inpatient Short Form (6-Clicks) Version 2  How much HELP from another person do you currently need... (If the patient hasn't done an activity recently, how much help from another person do you think they would need if they tried?) Total A Lot A Little None   1.  Putting on and taking off regular lower body clothing? []    1 []    2 [x]    3 []    4   2.  Bathing (including washing, rinsing, drying)? []    1 []    2 [x]    3 []    4   3.  Toileting, which includes using toilet, bedpan, or urinal? []    1 []    2 [x]    3 []    4   4.  Putting on and taking off regular upper body clothing? []    1 []    2 [x]    3 []    4   5.  Taking care of personal grooming such as brushing teeth? []    1 []    2 [x]    3 []    4   6.  Eating meals? []    1 []    2 []    3 [x]    4     Raw Score: 19/24                            Cutoff score ?191,2,3 had higher odds of discharging home with home health or need of SNF/IPR    1. , , Vinoth , Passek, . .  Validity of the AM-PAC "6-Clicks" Inpatient Daily Activity and Basic Mobility Short Forms. Physical Therapy Mar 2014, [phone]) 379-391; DOI: 10.2522/ptj.20130199  2. . Association of AM-PAC "6-Clicks" Basic Mobility and Daily Activity Scores With Discharge Destination. Phys Ther. 2021 Apr 4;101(4):pzab043. doi: 10.1093/ptj/pzab043. PMID: .  3.  Herbold J, Rajaraman D, , Agayby K, [place] S. Activity Measure for Post-Acute Care "6-Clicks" Basic Mobility Scores Predict Discharge Destination After Acute Care Hospitalization in Select Patient Groups: A Retrospective, Observational Study. Arch Rehabil Res Clin Transl. 2022 Jul 16;4(3):100204. doi: 10.1016/j.arrct. . PMID: ; PMCID .  4. , Coster W, Ni P. AM-PAC Short Forms Manual 4.0. Revised 02/2018.     Occupational Therapy Evaluation Charge Determination   History Examination Decision-Making   LOW Complexity : Brief history review  LOW Complexity : 1-3 performance deficits relating to physical, cognitive , or psychosocial skils that result in activity limitations and / or participation restrictions  LOW Complexity : No comorbidities that affect functional and no verbal or physical assistance needed to complete eval tasks       Based on the above components, the patient evaluation is determined to be of the following complexity level: LOW   Pain Rating:    Activity Tolerance:   desaturates with exertion and requires oxygen and observed SOB with activity    After treatment patient  left in no apparent distress:    Sitting in chair, Call bell within reach, and Bed / chair alarm activated    COMMUNICATION/EDUCATION:   The patient's plan of care was discussed with: Physical therapist and Registered nurse.     Home safety education was provided and the patient/caregiver indicated understanding., Patient/family have participated as able in goal setting and plan of care., and Patient/family agree to work toward stated goals and plan of care.    This patient's plan of care is appropriate for delegation to OTA.    Thank you for this referral.  Jocelyn C Skipper, OTR/L  Time Calculation: 28 mins

## 2021-05-13 NOTE — Progress Notes (Signed)
[place]  [address], [place], [place] [id number]  [phone]      Hospitalist  Progress Note      NAME:         Deborah LoftDonna [name]   DOB:        August 02, 1955  MRM:        [id number]    Date of service: 05/13/2021      Subjective: Patient seen and examined by me. Patient admitted with CHF exacerbation. She feels much better although still SOB. No cough or fever. Less lower extremity swelling.       Objective:    Vital Signs:    Visit Vitals  BP (!) 99/52   Pulse 80   Temp 98.4 F (36.9 C)   Resp 20   Ht 5\' 7"  (1.702 m)   Wt 157.6 kg (347 lb 6.4 oz)   SpO2 95%   BMI 54.41 kg/m        Intake/Output Summary (Last 24 hours) at 05/13/2021 0725  Last data filed at 05/12/2021 2158  Gross per 24 hour   Intake 1320 ml   Output 2500 ml   Net -1180 ml          Current inpatient medications reviewed:  Current Facility-Administered Medications   Medication Dose Route Frequency    nystatin (MYCOSTATIN) 100,000 unit/gram cream   Topical BID    magnesium sulfate 2 g/50 ml IVPB (premix or compounded)  2 g IntraVENous ONCE    traZODone (DESYREL) tablet 100 mg  100 mg Oral QHS    oxyCODONE IR (ROXICODONE) tablet 5 mg  5 mg Oral Q6H PRN    metoprolol succinate (TOPROL-XL) XL tablet 50 mg  50 mg Oral DAILY    albuterol-ipratropium (DUO-NEB) 2.5 MG-0.5 MG/3 ML  3 mL Nebulization Q4H PRN    heparin (porcine) injection 5,000 Units  5,000 Units SubCUTAneous Q8H    insulin lispro (HUMALOG) injection   SubCUTAneous AC&HS    glucose chewable tablet 16 g  4 Tablet Oral PRN    glucagon (GLUCAGEN) injection 1 mg  1 mg IntraMUSCular PRN    dextrose 10% infusion 0-250 mL  0-250 mL IntraVENous PRN    arformoteroL (BROVANA) neb solution 15 mcg  15 mcg Nebulization BID RT    budesonide (PULMICORT) 500 mcg/2 ml nebulizer suspension  500 mcg Nebulization BID RT    sodium chloride (NS) flush 5-40 mL  5-40 mL IntraVENous Q8H    sodium chloride (NS) flush 5-40 mL  5-40  mL IntraVENous PRN    acetaminophen (TYLENOL) tablet 650 mg  650 mg Oral Q6H PRN    Or    acetaminophen (TYLENOL) suppository 650 mg  650 mg Rectal Q6H PRN    polyethylene glycol (MIRALAX) packet 17 g  17 g Oral DAILY PRN    senna (SENOKOT) tablet 8.6 mg  1 Tablet Oral DAILY PRN    promethazine (PHENERGAN) tablet 12.5 mg  12.5 mg Oral Q6H PRN    Or    ondansetron (ZOFRAN) injection 4 mg  4 mg IntraVENous Q6H PRN    bumetanide (BUMEX) injection 1 mg  1 mg IntraVENous BID    cefTRIAXone (ROCEPHIN) 2 g in 0.9% sodium chloride 20 mL IV syringe  2 g IntraVENous Q24H    aspirin chewable tablet 81 mg  81 mg Oral DAILY    atorvastatin (LIPITOR) tablet 10 mg  10 mg Oral DAILY    citalopram (CELEXA) tablet 40 mg  40  mg Oral DAILY    glipiZIDE (GLUCOTROL) tablet 5 mg  5 mg Oral BID WITH MEALS    levothyroxine (SYNTHROID) tablet 150 mcg  150 mcg Oral ACB    montelukast (SINGULAIR) tablet 10 mg  10 mg Oral QHS    sucralfate (CARAFATE) tablet 1 g  1 g Oral TID       Physical Examination:    General:   Weak and ill, not in distress   Eyes:   pink conjunctivae, PERRLA with no discharge.  ENT:   no ottorrhea or rhinorrhea with dry mucous membranes  Pulm:  decreased but clear breath sounds without crackles or wheezes  Card:  has regular and normal S1, S2 without thrills, bruits. ++ peripheral edema  Abd:  Soft, obese, non-distended, normoactive bowel sounds   Musc:  No cyanosis, clubbing, atrophy or deformities.  Neuro: Awake and alert. Generally a non focal exam.   Psych:  Has a fair insight to her illness     Diagnostic testing:    Laboratory data reviewed and independently interpreted:    Recent Labs     05/13/21  0227 05/12/21  1730 05/11/21  0014   WBC 5.2 6.2 7.5   HGB 8.2* 9.2* 9.5*   HCT 27.8* 31.1* 31.5*   PLT 68* 85* 95*       Recent Labs     05/13/21  0227 05/12/21  0602 05/11/21  2236 05/11/21  0014   NA 138 136  --  137   K 3.5 4.0  --  4.4   CL 100 97  --  98   CO2 39* 40*  --  39*   GLU 129* 136*  --  120*   BUN 22*  24*  --  20   CREA 0.98 1.17*  --  1.12*   CA 8.0* 8.7  --  9.0   MG 1.4* 1.9 1.7  --        No components found for: [facility]    Imaging studies reviewed and reports noted, Telemetry reviewed and independently interpreted by me and available notes from other care providers - all reviewed by me on: 05/13/2021    Assessment and Plan:    Acute on chronic heart failure with preserved ejection fraction (HFpEF) (HCC) (05/09/2021) POA: likely triggered by missed diuretic use. Although her pro-BNP is better and CXR overall unchanged, she did appear symptomatic on admission. Remains overloaded. Echo done 03/2021 showed an EF of 50-55%. Continue IV Bumex with holding parameters. Fluid restriction. Low sodium diet. Cardiology following. Ambulate as tolerated. CM for discharge planning to a different SNF.      Bacteremia due to group B Streptococcus (05/09/2021) POA: during recent admission in 03/2021, she had GBS bacteremia complicated by pacemaker line vegetation. Seen by cardiology and ID then. Continue IV Ceftriaxone scheduled to run through 05/23/21. Stable     Acute on chronic respiratory failure with hypoxia and hypercapnia (HCC) (05/09/2021) POA: due to the CHF. Continue supplemental oxygen and wean as tolerated to keep sats > 90%. On 2-3 L/min     DM (diabetes mellitus), type 2 with complications POA: last A1c 5.7. Monitor blood glucose. Continue Glucotrol, SSi per protocol. DM diet as tolerated     COPD (chronic obstructive pulmonary disease) (HCC) (05/09/2021) POA: not in any exacerbation. Nebs with Brovana, Pulmicort.     Thrombocytopenia (HCC) (04/30/2020) POA: unclear etiology. Chronic. Trending down. Prior CT scans showed hepatic steatosis. Monitor       CKD (chronic  kidney disease), stage III (HCC) (04/30/2020) POA: monitor renal function with diuresis     Pacemaker / Aortic valve replaced / History of PSVT (paroxysmal supraventricular tachycardia) (05/09/2021) POA: she has a hx of a bioprosthetic AVR. Her pacer is not MRi  compatible. Monitor      Hypothyroidism PO: last TSH was 10 in 10/2020. Repeat is normal. Continue Levothyroxine     BMI 50.0-59.9, adult (HCC) (05/09/2021) /  OSA (obstructive sleep apnea) (05/09/2021) POA: resume BiPAP at night     Anemia (05/09/2021) POA: likely due to chronic illness. Monitor     Anxiety and depression POA: resume Celexa.     Hyperlipidemia POA: continue Lipitor    Care Plan discussed with: Patient, Nursing, CM     Prophylaxis:  Hep SQ                Anticipated Disposition:  SNF/LTC    PCP:      Barrett, Lance Sell, NP     I have personally examined and treated the patient at bedside during this period. To assist coordination of care and communication with nursing and staff, this note may be preliminary early in the day, but finalized by end of the day.         ___________________________________________________    Attending Physician:   Melynda Keller, MD

## 2021-05-13 NOTE — Progress Notes (Signed)
05/12/21: 1900:    Bedside and Verbal shift change report given to Tisyl,RN (oncoming nurse) by Vivien Rota (offgoing nurse). Report included the following information SBAR, Kardex, ED Summary, Procedure Summary, Intake/Output, MAR, Recent Results, Med Rec Status, Cardiac Rhythm  , and Alarm Parameters .         05/13/21:       0346:   Pt's Magnesium level came back 1.4. MgSo4 IVPB given as one dose,ordered by NP Sabrina.    0730:  Bedside and Verbal shift change report given to Natalia,RN (oncoming nurse) by Darlin Drop (offgoing nurse). Report included the following information SBAR, Kardex, ED Summary, Procedure Summary, Intake/Output, MAR, Recent Results, Med Rec Status, Cardiac Rhythm  , and Alarm Parameters .

## 2021-05-14 ENCOUNTER — Inpatient Hospital Stay: Admit: 2021-05-14 | Payer: MEDICARE | Primary: Family

## 2021-05-14 LAB — EKG, 12 LEAD, INITIAL
Atrial Rate: 116 {beats}/min
Atrial Rate: 83 {beats}/min
Atrial Rate: 84 {beats}/min
Calculated P Axis: 64 degrees
Calculated P Axis: 68 degrees
Calculated R Axis: -100 degrees
Calculated R Axis: -102 degrees
Calculated R Axis: -72 degrees
Calculated T Axis: 105 degrees
Calculated T Axis: 11 degrees
Calculated T Axis: 16 degrees
P-R Interval: 160 ms
P-R Interval: 168 ms
P-R Interval: 200 ms
Q-T Interval: 368 ms
Q-T Interval: 368 ms
Q-T Interval: 388 ms
QRS Duration: 160 ms
QRS Duration: 162 ms
QRS Duration: 190 ms
QTC Calculation (Bezet): 432 ms
QTC Calculation (Bezet): 458 ms
QTC Calculation (Bezet): 511 ms
Ventricular Rate: 116 {beats}/min
Ventricular Rate: 83 {beats}/min
Ventricular Rate: 84 {beats}/min

## 2021-05-14 LAB — METABOLIC PANEL, BASIC
Anion gap: 0 mmol/L — ABNORMAL LOW (ref 5–15)
BUN/Creatinine ratio: 21 — ABNORMAL HIGH (ref 12–20)
BUN: 27 MG/DL — ABNORMAL HIGH (ref 6–20)
CO2: 40 mmol/L — ABNORMAL HIGH (ref 21–32)
Calcium: 8.8 MG/DL (ref 8.5–10.1)
Chloride: 94 mmol/L — ABNORMAL LOW (ref 97–108)
Creatinine: 1.28 MG/DL — ABNORMAL HIGH (ref 0.55–1.02)
Glucose: 135 mg/dL — ABNORMAL HIGH (ref 65–100)
Potassium: 4.4 mmol/L (ref 3.5–5.1)
Sodium: 134 mmol/L — ABNORMAL LOW (ref 136–145)
eGFR: 47 mL/min/{1.73_m2} — ABNORMAL LOW (ref >60)

## 2021-05-14 LAB — HGB & HCT
HCT: 31 % — ABNORMAL LOW (ref 35.0–47.0)
HGB: 9.1 g/dL — ABNORMAL LOW (ref 11.5–16.0)

## 2021-05-14 LAB — GLUCOSE, POC
Glucose (POC): 160 mg/dL — ABNORMAL HIGH (ref 65–117)
Glucose (POC): 143 mg/dL — ABNORMAL HIGH (ref 65–117)
Glucose (POC): 204 mg/dL — ABNORMAL HIGH (ref 65–117)
Glucose (POC): 133 mg/dL — ABNORMAL HIGH (ref 65–117)

## 2021-05-14 LAB — MAGNESIUM
Magnesium: 2.3 mg/dL (ref 1.6–2.4)
Magnesium: 2.3 mg/dL (ref 1.6–2.4)

## 2021-05-14 LAB — PLATELET COUNT
PLATELET: 75 10*3/uL — ABNORMAL LOW (ref 150–400)
Platelets: 75 10*3/uL — ABNORMAL LOW (ref 150–400)

## 2021-05-14 LAB — POCT GLUCOSE
POC Glucose: 160 mg/dL — ABNORMAL HIGH (ref 65–117)
POC Glucose: 143 mg/dL — ABNORMAL HIGH (ref 65–117)
POC Glucose: 204 mg/dL — ABNORMAL HIGH (ref 65–117)
POC Glucose: 133 mg/dL — ABNORMAL HIGH (ref 65–117)

## 2021-05-14 LAB — EKG 12-LEAD
Atrial Rate: 116 {beats}/min
Atrial Rate: 83 {beats}/min
Atrial Rate: 84 {beats}/min
P Axis: 64 degrees
P Axis: 68 degrees
P-R Interval: 160 ms
P-R Interval: 168 ms
P-R Interval: 200 ms
Q-T Interval: 368 ms
Q-T Interval: 368 ms
Q-T Interval: 388 ms
QRS Duration: 160 ms
QRS Duration: 162 ms
QRS Duration: 190 ms
QTc Calculation (Bazett): 432 ms
QTc Calculation (Bazett): 458 ms
QTc Calculation (Bazett): 511 ms
R Axis: -100 degrees
R Axis: -102 degrees
R Axis: -72 degrees
T Axis: 105 degrees
T Axis: 11 degrees
T Axis: 16 degrees
Ventricular Rate: 116 {beats}/min
Ventricular Rate: 83 {beats}/min
Ventricular Rate: 84 {beats}/min

## 2021-05-14 LAB — BASIC METABOLIC PANEL
Anion Gap: 0 mmol/L — ABNORMAL LOW (ref 5–15)
BUN: 27 MG/DL — ABNORMAL HIGH (ref 6–20)
Bun/Cre Ratio: 21 — ABNORMAL HIGH (ref 12–20)
CO2: 40 mmol/L — ABNORMAL HIGH (ref 21–32)
Calcium: 8.8 MG/DL (ref 8.5–10.1)
Chloride: 94 mmol/L — ABNORMAL LOW (ref 97–108)
Creatinine: 1.28 MG/DL — ABNORMAL HIGH (ref 0.55–1.02)
ESTIMATED GLOMERULAR FILTRATION RATE: 47 mL/min/{1.73_m2} — ABNORMAL LOW (ref >60)
Glucose: 135 mg/dL — ABNORMAL HIGH (ref 65–100)
Potassium: 4.4 mmol/L (ref 3.5–5.1)
Sodium: 134 mmol/L — ABNORMAL LOW (ref 136–145)

## 2021-05-14 LAB — HEMOGLOBIN AND HEMATOCRIT
Hematocrit: 31 % — ABNORMAL LOW (ref 35.0–47.0)
Hemoglobin: 9.1 g/dL — ABNORMAL LOW (ref 11.5–16.0)

## 2021-05-14 MED ORDER — MIDODRINE 5 MG TAB
5 mg | Freq: Three times a day (TID) | ORAL | Status: DC
Start: 2021-05-14 — End: 2021-05-14

## 2021-05-14 MED FILL — ARFORMOTEROL 15 MCG/2 ML NEB SOLUTION: 15 mcg/2 mL | RESPIRATORY_TRACT | Qty: 2

## 2021-05-14 MED FILL — INSULIN LISPRO 100 UNIT/ML INJECTION: 100 unit/mL | SUBCUTANEOUS | Qty: 3

## 2021-05-14 MED FILL — HEPARIN (PORCINE) 5,000 UNIT/ML IJ SOLN: 5000 unit/mL | INTRAMUSCULAR | Qty: 1

## 2021-05-14 MED FILL — MONTELUKAST 10 MG TAB: 10 mg | ORAL | Qty: 1

## 2021-05-14 MED FILL — LEVOTHYROXINE 150 MCG TAB: 150 mcg | ORAL | Qty: 1

## 2021-05-14 MED FILL — BUDESONIDE 0.5 MG/2 ML NEB SUSPENSION: 0.5 mg/2 mL | RESPIRATORY_TRACT | Qty: 1

## 2021-05-14 MED FILL — METOPROLOL SUCCINATE SR 50 MG 24 HR TAB: 50 mg | ORAL | Qty: 1

## 2021-05-14 MED FILL — CEFTRIAXONE 2 GRAM SOLUTION FOR INJECTION: 2 gram | INTRAMUSCULAR | Qty: 2

## 2021-05-14 MED FILL — CARAFATE 1 GRAM TABLET: 1 gram | ORAL | Qty: 1

## 2021-05-14 MED FILL — GLIPIZIDE 5 MG TAB: 5 mg | ORAL | Qty: 1

## 2021-05-14 MED FILL — ATORVASTATIN 10 MG TAB: 10 mg | ORAL | Qty: 1

## 2021-05-14 MED FILL — OXYCODONE 5 MG TAB: 5 mg | ORAL | Qty: 1

## 2021-05-14 MED FILL — TRAZODONE 50 MG TAB: 50 mg | ORAL | Qty: 2

## 2021-05-14 MED FILL — BUMETANIDE 0.25 MG/ML IJ SOLN: 0.25 mg/mL | INTRAMUSCULAR | Qty: 4

## 2021-05-14 MED FILL — CITALOPRAM 20 MG TAB: 20 mg | ORAL | Qty: 2

## 2021-05-14 MED FILL — ASPIRIN 81 MG CHEWABLE TAB: 81 mg | ORAL | Qty: 1

## 2021-05-14 MED FILL — ONDANSETRON (PF) 4 MG/2 ML INJECTION: 4 mg/2 mL | INTRAMUSCULAR | Qty: 2

## 2021-05-14 NOTE — Progress Notes (Signed)
[place] [facility]  [address] [place], [place] [address]  [phone]      Hospitalist  Progress Note      NAME:         Deborah [name]   DOB:        1955-08-17  MRM:        [id number]    Date of service: 05/14/2021      Subjective: Patient seen and examined by me. Patient admitted with CHF exacerbation. She says she feels much better although she requires incremental oxygen. No cough. Remains some SOB. No fever or chills.        Objective:    Vital Signs:    Visit Vitals  BP 122/70 (BP 1 Location: Right lower arm, BP Patient Position: At rest)   Pulse 91   Temp 98.7 F (37.1 C)   Resp 15   Ht 5\' 7"  (1.702 m)   Wt 157.5 kg (347 lb 3.2 oz)   SpO2 92%   BMI 54.38 kg/m      No intake or output data in the 24 hours ending 05/14/21 0914       Current inpatient medications reviewed:  Current Facility-Administered Medications   Medication Dose Route Frequency    nystatin (MYCOSTATIN) 100,000 unit/gram cream   Topical BID    traZODone (DESYREL) tablet 100 mg  100 mg Oral QHS    oxyCODONE IR (ROXICODONE) tablet 5 mg  5 mg Oral Q6H PRN    metoprolol succinate (TOPROL-XL) XL tablet 50 mg  50 mg Oral DAILY    albuterol-ipratropium (DUO-NEB) 2.5 MG-0.5 MG/3 ML  3 mL Nebulization Q4H PRN    heparin (porcine) injection 5,000 Units  5,000 Units SubCUTAneous Q8H    insulin lispro (HUMALOG) injection   SubCUTAneous AC&HS    glucose chewable tablet 16 g  4 Tablet Oral PRN    glucagon (GLUCAGEN) injection 1 mg  1 mg IntraMUSCular PRN    dextrose 10% infusion 0-250 mL  0-250 mL IntraVENous PRN    arformoteroL (BROVANA) neb solution 15 mcg  15 mcg Nebulization BID RT    budesonide (PULMICORT) 500 mcg/2 ml nebulizer suspension  500 mcg Nebulization BID RT    sodium chloride (NS) flush 5-40 mL  5-40 mL IntraVENous Q8H    sodium chloride (NS) flush 5-40 mL  5-40 mL IntraVENous PRN    acetaminophen (TYLENOL) tablet 650 mg  650 mg Oral Q6H PRN    Or     acetaminophen (TYLENOL) suppository 650 mg  650 mg Rectal Q6H PRN    polyethylene glycol (MIRALAX) packet 17 g  17 g Oral DAILY PRN    senna (SENOKOT) tablet 8.6 mg  1 Tablet Oral DAILY PRN    promethazine (PHENERGAN) tablet 12.5 mg  12.5 mg Oral Q6H PRN    Or    ondansetron (ZOFRAN) injection 4 mg  4 mg IntraVENous Q6H PRN    bumetanide (BUMEX) injection 1 mg  1 mg IntraVENous BID    cefTRIAXone (ROCEPHIN) 2 g in 0.9% sodium chloride 20 mL IV syringe  2 g IntraVENous Q24H    aspirin chewable tablet 81 mg  81 mg Oral DAILY    atorvastatin (LIPITOR) tablet 10 mg  10 mg Oral DAILY    citalopram (CELEXA) tablet 40 mg  40 mg Oral DAILY    glipiZIDE (GLUCOTROL) tablet 5 mg  5 mg Oral BID WITH MEALS    levothyroxine (SYNTHROID) tablet 150 mcg  150 mcg  Oral ACB    montelukast (SINGULAIR) tablet 10 mg  10 mg Oral QHS    sucralfate (CARAFATE) tablet 1 g  1 g Oral TID       Physical Examination:    General:   Weak and ill, not in distress   Eyes:   pink conjunctivae, PERRLA with no discharge.  Pulm:  decreased but clear breath sounds with minimal crackles. No wheezes  Card:  has regular and normal S1, S2 without thrills, bruits. + peripheral edema  Abd:  Soft, obese, non-distended, normoactive bowel sounds   Musc:  No cyanosis, clubbing, atrophy or deformities.  Neuro: Awake and alert. Generally a non focal exam.   Psych:  Has a fair insight to her illness     Diagnostic testing:    Laboratory data reviewed and independently interpreted:    Recent Labs     05/14/21  0447 05/13/21  0227 05/12/21  1730   WBC  --  5.2 6.2   HGB 9.1* 8.2* 9.2*   HCT 31.0* 27.8* 31.1*   PLT 75* 68* 85*     Recent Labs     05/14/21  0447 05/13/21  0227 05/12/21  0602   NA 134* 138 136   K 4.4 3.5 4.0   CL 94* 100 97   CO2 40* 39* 40*   GLU 135* 129* 136*   BUN 27* 22* 24*   CREA 1.28* 0.98 1.17*   CA 8.8 8.0* 8.7   MG 2.3 1.4* 1.9     [facility] components found for: [facility]    Imaging studies reviewed and reports noted, Telemetry reviewed and independently  interpreted by me and available notes from other care providers - all reviewed by me on: 05/14/2021    Assessment and Plan:    Acute on chronic heart failure with preserved ejection fraction (HFpEF) (HCC) (05/09/2021) POA: triggered by missed diuretic use. She remains volume overloaded although progressively better. Echo done 03/2021 showed an EF of 50-55%. Continue IV Bumex with holding parameters and as BP allows. Fluid restriction. Low sodium diet. Cardiology following. Ambulate as tolerated. CM for discharge planning to a different SNF.      Bacteremia due to group B Streptococcus (05/09/2021) POA: during recent admission in 03/2021, she had GBS bacteremia complicated by pacemaker line vegetation. Seen by cardiology and ID at that time. She remains stable. Continue IV Ceftriaxone scheduled to run through 05/23/21. Continue to monitor      Acute on chronic respiratory failure with hypoxia and hypercapnia (HCC) (05/09/2021) POA: due to the CHF. She seemed labored today. Needing more oxygen. A repeat CXR today showed pulmonary edema. Continue supplemental oxygen and wean as tolerated to keep sats > 90%. IV diuresis     DM (diabetes mellitus), type 2 with complications POA: last A1c 5.7. Blood glucose stable. Continue Glucotrol, SSi per protocol. DM diet as tolerated. Follow glucose     COPD (chronic obstructive pulmonary disease) (HCC) (05/09/2021) POA: not in any exacerbation. Nebs with Brovana, Pulmicort.     Thrombocytopenia (HCC) (04/30/2020) POA: unclear etiology. Chronic. Prior CT scans showed hepatic steatosis. Counts stable.         CKD (chronic kidney disease), stage III (HCC) (04/30/2020) POA: Continue to monitor renal function with diuresis     Pacemaker / Aortic valve replaced / History of PSVT (paroxysmal supraventricular tachycardia) (05/09/2021) POA: she has a hx of a bioprosthetic AVR. Her pacer is not MRi compatible. Monitor      Hypothyroidism PO: last  TSH was 10 in 10/2020. Repeat is normal. Continue  Levothyroxine     BMI 50.0-59.9, adult (HCC) (05/09/2021) /  OSA (obstructive sleep apnea) (05/09/2021) POA: resume BiPAP at night     Anemia (05/09/2021) POA: likely due to chronic illness. Monitor     Anxiety and depression POA: resume Celexa.     Hyperlipidemia POA: continue Lipitor    Care Plan discussed with: Patient, Nursing, CM     Prophylaxis:  Hep SQ                Anticipated Disposition:  SNF/LTC    PCP:      Barrett, Lance Sell, NP     I have personally examined and treated the patient at bedside during this period. To assist coordination of care and communication with nursing and staff, this note may be preliminary early in the day, but finalized by end of the day.         ___________________________________________________    Attending Physician:   Melynda Keller, MD

## 2021-05-14 NOTE — Progress Notes (Signed)
Problem: Mobility Impaired (Adult and Pediatric)  Goal: *Acute Goals and Plan of Care (Insert Text)  Description: FUNCTIONAL STATUS PRIOR TO ADMISSION: Patient was modified independent using a single point cane for household and community amb. Recent hospital admission and d/c to [facility] last 2+ weeks then readmit    HOME SUPPORT PRIOR TO ADMISSION: The patient lived with son's family but did not require assist.    Physical Therapy Goals  Initiated 05/12/2021  1.  Patient will move from supine to sit and sit to supine , scoot up and down, and roll side to side in bed with modified independence within 7 day(s).    2.  Patient will transfer from bed to chair and chair to bed with modified independence using the least restrictive device within 7 day(s).  3.  Patient will perform sit to stand with modified independence within 7 day(s).  4.  Patient will ambulate with modified independence for 50 feet with the least restrictive device within 7 day(s).   5.  Patient will ascend/descend 5 stairs with 1 handrail(s) and SPC with modified independence within 7 day(s).   Outcome: Progressing Towards Goal   PHYSICAL THERAPY TREATMENT  Patient: Deborah [name] (66 y.o. female)  Date: 05/14/2021  Diagnosis: Acute on chronic heart failure with preserved ejection fraction (HFpEF) (HCC) [I50.33] Acute on chronic heart failure with preserved ejection fraction (HFpEF) (HCC)      Precautions: Fall  Chart, physical therapy assessment, plan of care and goals were reviewed.    ASSESSMENT  Patient continues with skilled PT services and is progressing towards goals. Patient this afternoon with fair participation and tolerance to physical therapy session. Attempted home oxygen assessment as below however patient requires at least 6L NC at this time for functional mobility and desaturates on that even. She recovers with seated rest on 6L and is able to be titrated down to 4L NC at rest. Blood pressure soft at start of session, but increases  as below with activity. She completes all functional mobility with SBA to supervision level assistance. Recommend HHPT upon d/c.       Pulse oximetry assessment   75% at rest on room air (if 88% or less, skip next steps)  78% at rest on 2L NC  80% at rest on 3L NC   96% at rest on 4L NC  86% with activity on 4L NC HR 103 bpm  86% with activity on 5L NC  83% with activity on 6L NC  93% at rest on 6L NC at conclusion of session; heart rate 85 bpm        Current Level of Function Impacting Discharge (mobility/balance): supervision/SBA    Other factors to consider for discharge: excess home oxygen needs at the moment         PLAN :  Patient continues to benefit from skilled intervention to address the above impairments.  Continue treatment per established plan of care.  to address goals.    Recommendation for discharge: (in order for the patient to meet his/her long term goals)  Physical therapy at least 2 days/week in the home AND ensure assist and/or supervision for safety with upright mobility    This discharge recommendation:  Has been made in collaboration with the attending provider and/or case management    IF patient discharges home will need the following DME: portable oxygen       SUBJECTIVE:   Patient stated "I just need to breathe."    OBJECTIVE DATA  SUMMARY:   Patient received supine in bed and was agreeable to participate in PT session.   Patient was cleared by nursing to participate in PT session.     Vitals:    05/14/21 1221 05/14/21 1226 05/14/21 1245   BP: (!) 83/67 (!) 131/101 (!) 119/57   BP 2: 97/72     BP 1 Location: Left lower arm Left lower arm Right lower arm   BP Patient Position: Semi fowlers Sitting Sitting  Comment: post ambulation   Pulse: 83 90 84   Pulse 2: 84     Temp:      Resp:  23    Height:      Weight:      SpO2: 95% (!) 75% 100%         Critical Behavior:  Neurologic State: Alert  Orientation Level: Oriented X4  Cognition: Appropriate decision making  Safety/Judgement: Awareness of  environment  Functional Mobility Training:  Bed Mobility:  Rolling: Supervision  Supine to Sit: Supervision     Scooting: Supervision        Transfers:  Sit to Stand: Supervision  Stand to Sit: Supervision                             Balance:  Sitting: Intact;Without support  Standing: Intact;With support  Ambulation/Gait Training:  Distance (ft): 30 Feet (ft)  Assistive Device: Gait belt;Walker, rolling           Gait Abnormalities: Antalgic;Decreased step clearance        Base of Support: Widened                      Therapeutic Exercises:     Pain Rating:  Patient complains of cervical pain which she attributes to possibly sleeping wrong    Activity Tolerance:   Fair, desaturates with exertion and requires oxygen, requires frequent rest breaks, and observed SOB with activity    After treatment patient left in no apparent distress:   Sitting in chair, Call bell within reach, and Bed / chair alarm activated    COMMUNICATION/COLLABORATION:   The patient's plan of care was discussed with: Registered nurse.     Chari Manning PT, DPT   Time Calculation: 30 mins

## 2021-05-14 NOTE — Progress Notes (Signed)
05/14/21 1103 05/14/21 1106 05/14/21 1110   Vital Signs   Pulse (Heart Rate) 89 90 91   Resp Rate 21 24 18    O2 Sat (%) 100 % (!) 88 % (!) 84 %   Level of Consciousness 0 0 0   BP (!) 108/92 104/66 105/68  (had to sit down before BP cuff completely finshed, knees wobby")   MAP (Calculated) 97 79 80   BP Patient Position Lying Sitting Standing   Oxygen Therapy   O2 Device Nasal cannula Nasal cannula Nasal cannula   O2 Flow Rate (L/min) 4 l/min 5 l/min 6 l/min

## 2021-05-14 NOTE — Progress Notes (Signed)
 Progress Notes by Holmes Asberry LABOR, OTA at 05/14/21 1529                Author: Holmes Asberry LABOR, OTA  Service: Occupational Therapy  Author Type: Occupational Therapy Assistant       Filed: 05/14/21 1533  Date of Service: 05/14/21 1529  Status: Addendum          Editor: Holmes Asberry LABOR DENSON (Occupational Therapy Assistant)       Related Notes: Original Note by Holmes Asberry LABOR, OTA (Occupational Therapy Assistant) filed  at 05/14/21 1533          Cosigner: Essie Shuck, OT at 06/01/21 1313                  Problem: Self Care Deficits Care Plan (Adult)   Goal: *Acute Goals and Plan of Care (Insert Text)   Description: FUNCTIONAL STATUS PRIOR TO ADMISSION: Patient was modified independent using a single point cane for functional mobility. She  reports able to care for self prior to recent hospitalization and rehab stay.    HOME SUPPORT: The patient lived with son and daughter in law but did not require assist.    Occupational Therapy Goals  Initiated 05/13/2021  1.  Patient  will perform lower body dressing with modified independence within 7 day(s).  2.  Patient will perform lower body dressing with modified independence within 7 day(s).  3.  Patient will perform toilet transfers with modified independence within  7 day(s).  4.  Patient will perform all aspects of toileting with modified independence within 7 day(s).  5.  Patient will participate in upper extremity therapeutic exercise/activities with supervision/set-up for 10 minutes within 7 day(s).     6.  Patient will utilize energy conservation techniques during functional activities with verbal cues within 7 day(s).     Outcome: Progressing Towards Goal    OCCUPATIONAL THERAPY TREATMENT   Patient: Deborah [name] (66 y.o. female)   Date: 05/14/2021   Diagnosis: Acute on chronic heart failure with preserved ejection fraction (HFpEF) (HCC) [I50.33] Acute on chronic  heart failure with preserved ejection fraction (HFpEF) (HCC)        Precautions: Fall   Chart,  occupational therapy assessment, plan of care, and goals were reviewed.        ASSESSMENT   Patient continues with skilled OT services and is progressing towards goals.  Pt ambulated to restroom and stood to use commode, sat to brush her teeth with set-up. O2 sats 92% on 4 L. She returned  to sit in chair for UE exercises       Current Level of Function Impacting Discharge (ADLs): supervision toileting and grooming       Other factors to consider for discharge:                PLAN :   Patient continues to benefit from skilled intervention to address the above impairments.  Continue treatment per established plan of care to address goals.      Recommend with staff: ADl's, there ex, there act      Recommend next OT session: cont towards goals      Recommendation for discharge: (in order for the patient to meet his/her long term  goals)   Occupational therapy at least 2 days/week in the home       This discharge recommendation:   Has not yet been discussed the attending provider and/or case management      IF patient  discharges home will need the following DME:              SUBJECTIVE:     Patient stated I have been lazy today.        OBJECTIVE DATA SUMMARY:     Cognitive/Behavioral Status:   Neurologic State: Alert   Orientation Level: Oriented X4   Cognition: Appropriate decision making                  Functional Mobility and Transfers for ADLs:   Bed Mobility:   Rolling: Supervision   Supine to Sit: Supervision   Scooting: Supervision      Transfers:   Sit to Stand: Supervision   Functional Transfers   Toilet Transfer : Supervision          Balance:   Sitting: Intact;Without support   Standing: Intact;With support      ADL Intervention:          Grooming   Grooming Assistance: Contact guard assistance   Brushing Teeth: Contact guard assistance                     Therapeutic Exercises:              EXERCISE    Sets    Reps    Active  Active Assist    Passive    Comments             Shoulder flex/ext  1  10  [x]               []              []                   Forearm supination/pronation  1  10  [x]              []              []                   Finger flex/ext  1  10  [x]              []              []                           Shoulder ab/dd  1  5  [x]              []              []                         []              []              []                         []              []              []                         []              []              []                         []              []              []                         []              []              []                         []              []              []                                 []              []              []   Activity Tolerance:    Fair      After treatment patient left in no apparent distress:    Sitting in chair and Call bell within reach        COMMUNICATION/COLLABORATION:     The patients plan of care was discussed with: Occupational therapist and Registered nurse.       Rebecca A Hite, COTA/L   Time Calculation: 23 mins

## 2021-05-14 NOTE — Progress Notes (Signed)
Problem: Pressure Injury - Risk of  Goal: *Prevention of pressure injury  Description: Document Braden Scale and appropriate interventions in the flowsheet.  Note: Pressure Injury Interventions:  Sensory Interventions: Discuss PT/OT consult with provider, Assess need for specialty bed, Assess changes in LOC    Moisture Interventions: Absorbent underpads, Apply protective barrier, creams and emollients, Assess need for specialty bed    Activity Interventions: PT/OT evaluation, Assess need for specialty bed    Mobility Interventions: HOB 30 degrees or less, Assess need for specialty bed    Nutrition Interventions: Document food/fluid/supplement intake    Friction and Shear Interventions: HOB 30 degrees or less                Problem: Falls - Risk of  Goal: *Absence of Falls  Description: Document Schmid Fall Risk and appropriate interventions in the flowsheet.  Outcome: Progressing Towards Goal  Note: Fall Risk Interventions:  Mobility Interventions: Assess mobility with egress test, Bed/chair exit alarm         Medication Interventions: Assess postural VS orthostatic hypotension, Bed/chair exit alarm    Elimination Interventions: Bed/chair exit alarm, Call light in reach    History of Falls Interventions: Consult care management for discharge planning, Bed/chair exit alarm

## 2021-05-14 NOTE — Progress Notes (Signed)
05/14/21 1121   Vital Signs   Pulse (Heart Rate) 84   Resp Rate 18   O2 Sat (%) 96 %   Level of Consciousness 0   BP (!) 113/47   MAP (Calculated) 69     After sitting up a few min

## 2021-05-14 NOTE — Progress Notes (Signed)
05/13/21: 1900: Bedside and Verbal shift change report given to Tisyl,RN (oncoming nurse) by Nelda Bucks (offgoing nurse). Report included the following information SBAR, Kardex, ED Summary, Procedure Summary, Intake/Output, MAR, Recent Results, Med Rec Status, Cardiac Rhythm  , and Alarm Parameters .       05/14/21: 0730: Bedside and Verbal shift change report given to [facility] (Cabin crew) by Darlin Drop (offgoing nurse). Report included the following information SBAR, Kardex, ED Summary, Procedure Summary, Intake/Output, MAR, Recent Results, Med Rec Status, Cardiac Rhythm  , and Alarm Parameters .

## 2021-05-14 NOTE — Progress Notes (Signed)
Progress Notes by Sharla Kidney, NP at 05/14/21 4287                Author: Sharla Kidney, NP  Service: Cardiology  Author Type: Nurse Practitioner       Filed: 05/14/21 0908  Date of Service: 05/14/21 0905  Status: Attested           Editor: Sharla Kidney, NP (Nurse Practitioner)  Cosigner: Lilli Few, MD at 05/14/21 1100          Attestation signed by Lilli Few, MD at 05/14/21 1100 (Updated)          Pt personally seen and examined. Chart reviewed.      Agree with advanced NP's history, exam and  A/P with changes/additons.      Blood pressure 122/70, pulse 91, temperature 98.7 F (37.1 C), resp. rate 15, height 5\' 7"  (1.702 m), weight 347 lb 3.2 oz (157.5 kg), SpO2 92 %.         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 MD, [facility]                                                                                                     [facility] CARDIOLOGY                     Cardiology Care Note       [] Initial Encounter      [x] Follow-up        Patient Name: Deborah [name] - DOB:Dec 25, 1955 -   Primary Cardiologist: [name] Card[facility] Physicians: 01/25/1956 MD   Consulting Cardiologist: [id number] Cardiology Physicians: Sondra Barges MD        Reason for encounter: CHF       HPI:         Deborah [name] is a 66 y.o. female with PMH of HF p EF, a fib, possible atrial lead vegetation treated abx, repeat ECHO with no evidence of vegetation  with COPD, A/C resp failure who was recently discharged from hospital on  04/24/2021 to [facility] for rehab.       She says that she had not been receiving her Bumex on a consistent basis and missed several days dosing.  She started having a progressively worsening SOB associated with lower extremity swelling. She says she was using oxygen but required supplementation in the ED. She denies any cough or fever. No chills, nausea or vomiting.      Subjective:        Aliena Ghrist reports breathing improved.          Assessment and Plan          1 A/C HF p EF: seems as if her diuretics were not being administered at SNF. pBNP is improved and chest xray unchanged.   Cont IV loops as she continues to have fluid overload.       2 Hx a fib s/p PPM (not MRI compatible)/ s/p AVR: ectopy noted on tele.  Has had bigemny and intermittent episodes of SVT with Pacer tracking at 150 bpm. Cont metoprolol       3 Bacteremia due to group B Streptococcus: during recent admission, she had GBS bacteremia complicated by pacemaker line vegetation. Continue IV Ceftriaxone scheduled to run through 05/23/21.       4 Acute on chronic respiratory failure with hypoxia and hypercapnia/COPD: On NC        5 Hypokalemia: replete lytes        6 T 2 DM       7 CKD       8 Obesity: Body mass index is 54.38 kg/m.       9 Thrombocytopenia           ____________________________________________________________      Cardiac testing   04/08/21      ECHO ADULT FOLLOW-UP OR LIMITED 04/17/2021 04/17/2021      Interpretation Summary   ?  Aortic Valve: Not well visualized. Edwards bovine bioprosthetic valve with a size of 25 mm. Mild regurgitation.  Stenosis of the aortic valve. AV mean gradient is 38 mmHg. AV area by continuity VTI is 1.0 cm2.   ?  Technical qualifiers: Echo study was technically difficult due to patient's body habitus.      Signed by: Candyce Churn, DO on 04/17/2021  5:00 PM         02/16/21      NUCLEAR CARDIAC STRESS TEST 02/20/2021  02/26/2021      Interpretation Summary   ?  Nuclear Findings: LV perfusion is normal. There is no evidence of inducible ischemia.   ?  Nuclear Findings: The defect appears to be caused by breast attenuation.   ?  ECG: Resting ECG demonstrates normal sinus rhythm.   ?  ECG: Stress ECG was negative for ischemia.   ?  Stress Test: A pharmacological stress test was performed using lexiscan. Hemodynamics are adequate for diagnosis. Blood pressure demonstrated  a normal response and heart rate demonstrated a normal response to stress. The patient's heart rate recovery was normal. The patient reported dyspnea and no chest pain during the stress test.      Ventricular bigeminy   Unifocal PVC, RBBB and superior axis PVCs      Signed by: Thurston Pounds, MD on 02/20/2021  5:00 PM      Most recent HS troponins:   No results for input(s): TROPHS in the last 72 hours.      No lab exists for component:  CKMB         ECG:      EKG Results                  Procedure  720  Value  Units  Date/Time           EKG 12 LEAD INITIAL [[id number]]  Collected: 05/10/21 2130       Order Status: Completed  Updated: 05/12/21 1304                Ventricular Rate  80  BPM           Atrial Rate  60  BPM           P-R Interval  212  ms           QRS Duration  112  ms           Q-T Interval  526  ms           QTC Calculation (Bezet)  606  ms           Calculated P Axis  61  degrees           Calculated R Axis  -55  degrees           Calculated T Axis  -158  degrees                Diagnosis  --             Atrial-paced rhythm with prolonged AV conduction with frequent premature    ventricular complexes   Incomplete right bundle branch block   Left anterior fascicular block   Septal infarct , age undetermined   T wave abnormality, consider lateral ischemia   Prolonged QT   Abnormal ECG   When compared with ECG of 09-May-2021 20:16,   No significant change      Confirmed by Rathi MD, Vikas ([id number]) on 05/12/2021 1:04:16 PM              EKG, 12 LEAD, INITIAL  [[id number]]         Order Status: Sent         EKG 12 LEAD INITIAL [[id number]]  Collected: 05/09/21 2016       Order Status: Completed  Updated: 05/11/21 1349                Ventricular Rate  97  BPM           Atrial Rate  49  BPM           P-R Interval  160  ms           QRS Duration  160  ms           Q-T Interval  350  ms           QTC Calculation (Bezet)  444  ms           Calculated P Axis  67  degrees           Calculated R Axis  -117  degrees           Calculated T Axis  6  degrees                Diagnosis  --             Sinus bradycardia with marked sinus arrhythmia with frequent atrial-paced    complexes and with frequent and consecutive premature ventricular complexes   Right bundle branch block   Possible Lateral infarct , age undetermined   Abnormal ECG   When compared with ECG of 13-Apr-2021 22:14,   premature ventricular complexes are now present  premature atrial complexes are no longer present   Borderline criteria for Lateral infarct are now present   Confirmed by Rathi MD, Vikas [phone]) on 05/11/2021 1:49:45 PM                           Review of Systems:      All other systems reviewed and all negative except as written in HPI       Patient unable to provide secondary  to condition        Past Medical History:        Diagnosis  Date         ?  (HFpEF) heart failure with preserved ejection fraction (HCC)       ?  Anxiety and depression       ?  Aortic valve replaced            S/p bovine aortic valve replacement.         ?  Asthma       ?  Chronic narcotic use       ?  Chronic obstructive pulmonary disease (HCC)       ?  Chronic pain       ?  CKD (chronic kidney disease), stage III (HCC)            Baseline creatinine is 1.3-1.4 with GFR in the 40s.         ?  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  GERD (gastroesophageal reflux disease)       ?  History of vascular access device  04/13/2021          4 FR Single PICC for LTABX: R cephalic vessell length 48 CM Max P leave @ 1 CM out;  Arm circumferenc 40 CM         ?  Hyperlipidemia       ?  Hypothyroidism       ?  Morbid obesity (HCC)       ?  Neuropathy       ?  Obstructive sleep apnea           ?  Rhinitis            Past Surgical History:         Procedure  Laterality  Date          ?  COLONOSCOPY  N/A  12/01/2020          COLONOSCOPY performed by Ian Malkin, MD at [facility] ENDOSCOPY          ?  HX AORTIC VALVE REPLACEMENT              Bovine bioprosthetic          ?  HX PACEMAKER            Social Hx:  reports that she quit smoking about 11 years ago. Her smoking use included cigarettes. She has a 40.00 pack-year smoking history. She has never used smokeless tobacco. She reports that she does not currently use alcohol. She reports that she  does not use drugs.   Family Hx: family history includes Hypertension in her father and mother.     Allergies        Allergen  Reactions         ?  Nitroglycerin  Unknown (comments)  hypotension         ?  Aloe Vera  Rash     ?  Hydrochlorothiazide  Other (comments)             Reports 'kidneys dry up"          ?  Tetanus And Diphther. Tox (Pf)  Swelling             Swelling of arm and it turns black              OBJECTIVE:     Wt Readings from Last 3 Encounters:        05/14/21  157.5 kg (347 lb 3.2 oz)     04/24/21  (!) 160.8 kg (354 lb 8 oz)        03/09/21  149.7 kg (330 lb)        No intake or output data in the 24 hours ending 05/14/21 0906         Physical Exam:      Vitals:      Vitals:             05/13/21 2308  05/14/21 0402  05/14/21 0616  05/14/21 0823           BP:  102/69  (!) 129/92  104/78  122/70     Pulse:  83  (!) 118  (!) 117  91     Resp:  Temp:  97.9 F (36.6 C)  98 F (36.7 C)    98.7 F (37.1 C)     SpO2:  97%  93%    92%     Weight:    157.5 kg (347 lb 3.2 oz)               Height:                Telemetry: SR, paced w/ episodes of tachy        Gen: Morbidly obese, in no distress    Neck: Supple, No JVD, No Carotid Bruit   Resp: No accessory muscle use,  diminished breath sounds, B/L Basal rales are present.    Card: Regular Rate,Rythm, Normal S1, S2, No murmurs, rubs or gallop.    Abd:   obese, Soft, non-tender, non-distended, BS+    MSK: No cyanosis   Skin: No rashes     Neuro: Moving all four extremities, follows commands appropriately   Psych: Fair insight, oriented to person, place, alert, Nml Affect   LE: 3+ edema      Data Review:       Radiology:    XR Results (most recent):   Results from Hospital Encounter encounter on 05/09/21      XR CHEST PORT      Narrative   PORTABLE CHEST RADIOGRAPH/S: 05/14/2021 8:01 AM      INDICATION: Worsening respiratory status.      COMPARISON: 05/09/2021, 04/15/2021, CT 04/14/2021.      TECHNIQUE: Portable frontal radiograph/s of the chest.      FINDINGS:   The lungs are hypoinflated, and there is pulmonary vascular crowding.   Interstitial and alveolar edema are probably superimposed. The central airways   are patent. No pneumothorax and no large pleural effusion. A right PICC and   pacemaker are in place. The heart is enlarged. Post CABG and aortic valve   replacement.      Impression   Cardiomegaly,  pulmonary vascular congestion, probable pulmonary edema.           Recent Labs            05/14/21   0447  05/13/21   0227     NA  134*  138     K  4.4  3.5     CL  94*  100     CO2  40*  39*     BUN  27*  22*     CREA  1.28*  0.98     GLU  135*  129*         CA  8.8  8.0*          Recent Labs             05/14/21   0447  05/13/21   0227  05/12/21   1730     WBC   --   5.2  6.2     HGB  9.1*  8.2*  9.2*     HCT  31.0*  27.8*  31.1*          PLT  75*  68*  85*        No results for input(s): PTP, INR, AP, INREXT, INREXT in the last 72 hours.      No lab exists for component: PTTP, GPT, SGOT      No results for input(s): CHOL, LDLC in the last 72 hours.      No lab exists for component: TGL, HDLC,  HBA1C         Current meds:      Current Facility-Administered Medications:    ?  nystatin (MYCOSTATIN) 100,000 unit/gram cream, ,  Topical, BID, Marca Ancona C, NP, Given at 05/13/21 1718   ?  traZODone (DESYREL) tablet 100 mg, 100 mg, Oral, QHS, Horald Chestnut, DO, 100 mg at 05/13/21 2232   ?  oxyCODONE IR (ROXICODONE) tablet 5 mg, 5 mg, Oral, Q6H PRN, Horald Chestnut, DO, 5 mg at 05/14/21 0309   ?  metoprolol succinate (TOPROL-XL) XL tablet 50 mg, 50 mg, Oral, DAILY, Meka Lewan, Georga Hacking, NP, 50 mg at 05/14/21 0831   ?  albuterol-ipratropium (DUO-NEB) 2.5 MG-0.5 MG/3 ML, 3 mL, Nebulization, Q4H PRN, Jamil, Arna Medici, DO, 3 mL at 05/13/21 1901   ?  heparin (porcine) injection 5,000 Units, 5,000 Units, SubCUTAneous, Q8H, Horald Chestnut, DO, 5,000 Units at 05/14/21 0831   ?  insulin lispro (HUMALOG) injection, , SubCUTAneous, AC&HS, Horald Chestnut, DO, 2 Units at 05/13/21 1142   ?  glucose chewable tablet 16 g, 4 Tablet, Oral, PRN, Horald Chestnut, DO   ?  glucagon (GLUCAGEN) injection 1 mg, 1 mg, IntraMUSCular, PRN, Jamil, Arna Medici, DO   ?  dextrose 10% infusion 0-250 mL, 0-250 mL, IntraVENous, PRN, Jamil, Arna Medici, DO   ?  arformoteroL (BROVANA) neb solution 15 mcg, 15 mcg, Nebulization, BID RT, Jamil, Arna Medici, DO, 15 mcg at 05/14/21 0720   ?  budesonide (PULMICORT) 500 mcg/2 ml nebulizer suspension, 500 mcg, Nebulization, BID RT, Jamil, Nora, DO, 500 mcg at 05/14/21 0720   ?  sodium chloride (NS) flush 5-40 mL, 5-40 mL, IntraVENous, Q8H, Melynda Keller, MD, 10 mL at 05/14/21 0617   ?  sodium chloride (NS) flush 5-40 mL, 5-40 mL, IntraVENous, PRN, Melynda Keller, MD   ?  acetaminophen (TYLENOL) tablet 650 mg, 650 mg, Oral, Q6H PRN, 650 mg at 05/12/21 0826 **OR** acetaminophen (TYLENOL) suppository 650 mg, 650 mg,  Rectal, Q6H PRN, Melynda Kellermuria, Robert O, MD   ?  polyethylene glycol (MIRALAX) packet 17 g, 17 g, Oral, DAILY PRN, Melynda Kellermuria, Robert O, MD   ?  senna (SENOKOT) tablet 8.6 mg, 1 Tablet, Oral, DAILY PRN, Melynda Kellermuria, Robert O, MD   ?  promethazine (PHENERGAN) tablet 12.5 mg, 12.5 mg, Oral, Q6H PRN **OR** ondansetron (ZOFRAN) injection 4 mg, 4 mg, IntraVENous, Q6H PRN, Melynda Kellermuria,  Robert O, MD, 4 mg at 05/13/21 2232   ?  bumetanide (BUMEX) injection 1 mg, 1 mg, IntraVENous, BID, Melynda Kellermuria, Robert O, MD, 1 mg at 05/14/21 [id number]   ?  cefTRIAXone (ROCEPHIN) 2 g in 0.9% sodium chloride 20 mL IV syringe, 2 g, IntraVENous, Q24H, Melynda Kellermuria, Robert O, MD, 2 g at 05/14/21 0831   ?  aspirin chewable tablet 81 mg, 81 mg, Oral, DAILY, Melynda Kellermuria, Robert O, MD, 81 mg at 05/14/21 0831   ?  atorvastatin (LIPITOR) tablet 10 mg, 10 mg, Oral, DAILY, Melynda Kellermuria, Robert O, MD, 10 mg at 05/14/21 0831   ?  citalopram (CELEXA) tablet 40 mg, 40 mg, Oral, DAILY, Melynda Kellermuria, Robert O, MD, 40 mg at 05/14/21 0831   ?  glipiZIDE (GLUCOTROL) tablet 5 mg, 5 mg, Oral, BID WITH MEALS, Melynda Kellermuria, Robert O, MD, 5 mg at 05/14/21 0831   ?  levothyroxine (SYNTHROID) tablet 150 mcg, 150 mcg, Oral, ACB, Melynda Kellermuria, Robert O, MD, 150 mcg at 05/14/21 [id number]   ?  montelukast (SINGULAIR) tablet 10 mg, 10 mg, Oral, QHS, Melynda Kellermuria, Robert O, MD, 10 mg at 05/13/21 2232   ?  sucralfate (CARAFATE) tablet 1 g, 1 g, Oral, TID, Melynda Kellermuria, Robert O, MD, 1 g at 05/14/21 0831      Sharla KidneySusan M Gregory Dowe, NP      [facility]   Call center: (P) [phone]  (F) [phone]         CC:Barrett, Lance Sellita M, NP

## 2021-05-14 NOTE — Progress Notes (Signed)
Care Management follow up    1340 - PT evaluation from today recommending home health follow up, order obtained.  Discussed with patient, choice is [facility], [facility], [facility], [facility], and [facility].  Referrals sent via [facility], await response.    1430 - ACCEPTED by [facility], AVS updated. Spoke with patient's son Sparrow [place], he is agreeable with plan for home health services.    Patient admitted for acute/chronic CHF  History of: diabetes, depression, anxiety, COPD, CKD, hypothyroidism, GERD.    RUR 29 (Score %) high   Is This a Readmission YES  Is this a Bundle NO    Current status  Patient discussed during interdisciplinary rounds.  Patient continues to require medical management including ongoing assessment and monitoring.  Patient has multiple referrals pending for SNF, out of network with [facility] rehab. Discussed rehab placement vs home with home health and family assistance.  Patient states she lives her son and daughter in law, also states her daughter is able to stay with her when family members are at work so she will have 24/7 family support.  She prefers to go home at this point.  Await today's PT/OT evaluations.     Transition of Care Plan  Monitor patient status and response to treatment.  Patient continues to require medical management.  CM needs: home health vs SNF placement  Await current PT/OT evaluations.  Possible need for home oxygen, will monitor and will need home O2 evaluation prior to DC.  CM to monitor progress and recommendations.    Midge Aver, RN, MSN/Care manager

## 2021-05-14 NOTE — Progress Notes (Signed)
PT refused the CPAP tonight

## 2021-05-15 LAB — METABOLIC PANEL, BASIC
Anion gap: NEGATIVE mmol/L (ref 5–15)
BUN/Creatinine ratio: 23 — ABNORMAL HIGH (ref 12–20)
BUN: 28 MG/DL — ABNORMAL HIGH (ref 6–20)
CO2: 42 mmol/L — CR (ref 21–32)
Chloride: 95 mmol/L — ABNORMAL LOW (ref 97–108)
Creatinine: 1.24 MG/DL — ABNORMAL HIGH (ref 0.55–1.02)
Glucose: 115 mg/dL — ABNORMAL HIGH (ref 65–100)
Potassium: 4.5 mmol/L (ref 3.5–5.1)
Sodium: 135 mmol/L — ABNORMAL LOW (ref 136–145)
eGFR: 49 mL/min/{1.73_m2} — ABNORMAL LOW (ref >60)

## 2021-05-15 LAB — GLUCOSE, POC
Glucose (POC): 127 mg/dL — ABNORMAL HIGH (ref 65–117)
Glucose (POC): 104 mg/dL (ref 65–117)
Glucose (POC): 139 mg/dL — ABNORMAL HIGH (ref 65–117)
Glucose (POC): 112 mg/dL (ref 65–117)

## 2021-05-15 LAB — HGB & HCT
HCT: 29.8 % — ABNORMAL LOW (ref 35.0–47.0)
HGB: 8.8 g/dL — ABNORMAL LOW (ref 11.5–16.0)

## 2021-05-15 LAB — MAGNESIUM
Magnesium: 1.8 mg/dL (ref 1.6–2.4)
Magnesium: 1.8 mg/dL (ref 1.6–2.4)

## 2021-05-15 LAB — BASIC METABOLIC PANEL
Anion Gap: NEGATIVE mmol/L (ref 5–15)
BUN: 28 MG/DL — ABNORMAL HIGH (ref 6–20)
Bun/Cre Ratio: 23 — ABNORMAL HIGH (ref 12–20)
CO2: 42 mmol/L (ref 21–32)
Calcium: 8.8 MG/DL (ref 8.5–10.1)
Chloride: 95 mmol/L — ABNORMAL LOW (ref 97–108)
Creatinine: 1.24 MG/DL — ABNORMAL HIGH (ref 0.55–1.02)
ESTIMATED GLOMERULAR FILTRATION RATE: 49 mL/min/{1.73_m2} — ABNORMAL LOW (ref >60)
Glucose: 115 mg/dL — ABNORMAL HIGH (ref 65–100)
Potassium: 4.5 mmol/L (ref 3.5–5.1)
Sodium: 135 mmol/L — ABNORMAL LOW (ref 136–145)

## 2021-05-15 LAB — POCT GLUCOSE
POC Glucose: 127 mg/dL — ABNORMAL HIGH (ref 65–117)
POC Glucose: 104 mg/dL (ref 65–117)
POC Glucose: 139 mg/dL — ABNORMAL HIGH (ref 65–117)
POC Glucose: 112 mg/dL (ref 65–117)

## 2021-05-15 LAB — HEMOGLOBIN AND HEMATOCRIT
Hematocrit: 29.8 % — ABNORMAL LOW (ref 35.0–47.0)
Hemoglobin: 8.8 g/dL — ABNORMAL LOW (ref 11.5–16.0)

## 2021-05-15 MED ORDER — ACETAZOLAMIDE 250 MG TAB
250 mg | Freq: Two times a day (BID) | ORAL | Status: DC
Start: 2021-05-15 — End: 2021-05-28
  Administered 2021-05-15 – 2021-05-23 (×15): via ORAL

## 2021-05-15 MED FILL — ONDANSETRON (PF) 4 MG/2 ML INJECTION: 4 mg/2 mL | INTRAMUSCULAR | Qty: 2

## 2021-05-15 MED FILL — CEFTRIAXONE 2 GRAM SOLUTION FOR INJECTION: 2 gram | INTRAMUSCULAR | Qty: 2

## 2021-05-15 MED FILL — HEPARIN (PORCINE) 5,000 UNIT/ML IJ SOLN: 5000 unit/mL | INTRAMUSCULAR | Qty: 1

## 2021-05-15 MED FILL — METOPROLOL SUCCINATE SR 50 MG 24 HR TAB: 50 mg | ORAL | Qty: 1

## 2021-05-15 MED FILL — BUDESONIDE 0.5 MG/2 ML NEB SUSPENSION: 0.5 mg/2 mL | RESPIRATORY_TRACT | Qty: 1

## 2021-05-15 MED FILL — LEVOTHYROXINE 150 MCG TAB: 150 mcg | ORAL | Qty: 1

## 2021-05-15 MED FILL — TRAZODONE 50 MG TAB: 50 mg | ORAL | Qty: 2

## 2021-05-15 MED FILL — IPRATROPIUM-ALBUTEROL 2.5 MG-0.5 MG/3 ML NEB SOLUTION: 2.5 mg-0.5 mg/3 ml | RESPIRATORY_TRACT | Qty: 3

## 2021-05-15 MED FILL — GLIPIZIDE 5 MG TAB: 5 mg | ORAL | Qty: 1

## 2021-05-15 MED FILL — CARAFATE 1 GRAM TABLET: 1 gram | ORAL | Qty: 1

## 2021-05-15 MED FILL — BUMETANIDE 0.25 MG/ML IJ SOLN: 0.25 mg/mL | INTRAMUSCULAR | Qty: 4

## 2021-05-15 MED FILL — ARFORMOTEROL 15 MCG/2 ML NEB SOLUTION: 15 mcg/2 mL | RESPIRATORY_TRACT | Qty: 2

## 2021-05-15 MED FILL — ACETAZOLAMIDE 250 MG TAB: 250 mg | ORAL | Qty: 1

## 2021-05-15 MED FILL — CITALOPRAM 20 MG TAB: 20 mg | ORAL | Qty: 2

## 2021-05-15 MED FILL — ASPIRIN 81 MG CHEWABLE TAB: 81 mg | ORAL | Qty: 1

## 2021-05-15 MED FILL — ATORVASTATIN 10 MG TAB: 10 mg | ORAL | Qty: 1

## 2021-05-15 MED FILL — MONTELUKAST 10 MG TAB: 10 mg | ORAL | Qty: 1

## 2021-05-15 NOTE — Progress Notes (Signed)
[facility]  [address] [place], [place] [address]  [phone]      Hospitalist  Progress Note      NAME:         Deborah [name]   DOB:        [birth year]  MRM:        [id number]    Date of service: 05/15/2021      Subjective: Patient seen and examined by me. Patient admitted with CHF exacerbation. She continues to feel well although weak. Less lower extremity swelling and less exertional dyspnea. No cough, fever or chills.         Objective:    Vital Signs:    Visit Vitals  BP (!) 129/59 (BP 1 Location: Right lower arm, BP Patient Position: At rest;Semi fowlers)   Pulse 79   Temp 98.4 F (36.9 C)   Resp 20   Ht 5\' 7"  (1.702 m)   Wt (!) 161.4 kg (355 lb 12.8 oz)   SpO2 96%   BMI 55.73 kg/m        Intake/Output Summary (Last 24 hours) at 05/15/2021 0904  Last data filed at 05/15/2021 05/17/2021  Gross per 24 hour   Intake 200 ml   Output 800 ml   Net -600 ml          Current inpatient medications reviewed:  Current Facility-Administered Medications   Medication Dose Route Frequency    nystatin (MYCOSTATIN) 100,000 unit/gram cream   Topical BID    traZODone (DESYREL) tablet 100 mg  100 mg Oral QHS    oxyCODONE IR (ROXICODONE) tablet 5 mg  5 mg Oral Q6H PRN    metoprolol succinate (TOPROL-XL) XL tablet 50 mg  50 mg Oral DAILY    albuterol-ipratropium (DUO-NEB) 2.5 MG-0.5 MG/3 ML  3 mL Nebulization Q4H PRN    heparin (porcine) injection 5,000 Units  5,000 Units SubCUTAneous Q8H    insulin lispro (HUMALOG) injection   SubCUTAneous AC&HS    glucose chewable tablet 16 g  4 Tablet Oral PRN    glucagon (GLUCAGEN) injection 1 mg  1 mg IntraMUSCular PRN    dextrose 10% infusion 0-250 mL  0-250 mL IntraVENous PRN    arformoteroL (BROVANA) neb solution 15 mcg  15 mcg Nebulization BID RT    budesonide (PULMICORT) 500 mcg/2 ml nebulizer suspension  500 mcg Nebulization BID RT    sodium chloride (NS) flush 5-40 mL  5-40 mL IntraVENous Q8H     sodium chloride (NS) flush 5-40 mL  5-40 mL IntraVENous PRN    acetaminophen (TYLENOL) tablet 650 mg  650 mg Oral Q6H PRN    Or    acetaminophen (TYLENOL) suppository 650 mg  650 mg Rectal Q6H PRN    polyethylene glycol (MIRALAX) packet 17 g  17 g Oral DAILY PRN    senna (SENOKOT) tablet 8.6 mg  1 Tablet Oral DAILY PRN    promethazine (PHENERGAN) tablet 12.5 mg  12.5 mg Oral Q6H PRN    Or    ondansetron (ZOFRAN) injection 4 mg  4 mg IntraVENous Q6H PRN    bumetanide (BUMEX) injection 1 mg  1 mg IntraVENous BID    cefTRIAXone (ROCEPHIN) 2 g in 0.9% sodium chloride 20 mL IV syringe  2 g IntraVENous Q24H    aspirin chewable tablet 81 mg  81 mg Oral DAILY    atorvastatin (LIPITOR) tablet 10 mg  10 mg Oral DAILY    citalopram (CELEXA) tablet 40  mg  40 mg Oral DAILY    glipiZIDE (GLUCOTROL) tablet 5 mg  5 mg Oral BID WITH MEALS    levothyroxine (SYNTHROID) tablet 150 mcg  150 mcg Oral ACB    montelukast (SINGULAIR) tablet 10 mg  10 mg Oral QHS    sucralfate (CARAFATE) tablet 1 g  1 g Oral TID       Physical Examination:    General:   Weak and ill, not in distress   Eyes:   pink conjunctivae, PERRLA with no discharge.  Pulm:  decreased but clear breath sounds with minimal crackles.   Card:  has regular and normal S1, S2 without thrills, bruits. + peripheral edema  Abd:  Soft, obese, non-distended, normoactive bowel sounds   Musc:  No cyanosis, clubbing, atrophy or deformities.  Neuro: Awake and alert. No new focal symptoms.    Psych:  Has a fair insight to her illness     Diagnostic testing:    Laboratory data reviewed and independently interpreted:    Recent Labs     05/15/21  0156 05/14/21  0447 05/13/21  0227 05/12/21  1730   WBC  --   --  5.2 6.2   HGB 8.8* 9.1* 8.2* 9.2*   HCT 29.8* 31.0* 27.8* 31.1*   PLT  --  75* 68* 85*       Recent Labs     05/15/21  0156 05/14/21  0447 05/13/21  0227   NA 135* 134* 138   K 4.5 4.4 3.5   CL 95* 94* 100   CO2 42* 40* 39*   GLU 115* 135* 129*   BUN 28* 27* 22*   CREA 1.24* 1.28*  0.98   CA 8.8 8.8 8.0*   MG 1.8 2.3 1.4*       No components found for: [facility]    Imaging studies reviewed and reports noted, Telemetry reviewed and independently interpreted by me and available notes from other care providers - all reviewed by me on: 05/15/2021    Assessment and Plan:    Acute on chronic heart failure with preserved ejection fraction (HFpEF) (HCC) (05/09/2021) POA: triggered by missed diuretic use. She remains volume overloaded even with diuresis and now with contraction alkalosis. Echo done 03/2021 showed an EF of 50-55%. Continue IV Bumex with holding parameters and as BP allows. Add diamox. Fluid restriction. Low sodium diet. Cardiology following and will guide on timing for discharge.       Bacteremia due to group B Streptococcus (05/09/2021) POA: during recent admission in 03/2021, she had GBS bacteremia complicated by pacemaker line vegetation. Seen by cardiology and ID at that time. She remains stable. Continue IV Ceftriaxone scheduled to run through 05/23/21. Continue to monitor      Acute on chronic respiratory failure with hypoxia and hypercapnia (HCC) (05/09/2021) POA: due to the CHF. She seemed labored today. Needing more oxygen. A repeat CXR 4/17 showed pulmonary edema. Continue supplemental oxygen and wean as tolerated to keep sats > 90%. IV diuresis. On 4L/min today. Will need assessment for home oxygen at discharge     DM (diabetes mellitus), type 2 with complications POA: last A1c 5.7. Blood glucose stable. Continue Glucotrol, SSi per protocol. DM diet as tolerated.     COPD (chronic obstructive pulmonary disease) (HCC) (05/09/2021) POA: not in any exacerbation. Nebs with Brovana, Pulmicort. Oxygen as above    Thrombocytopenia (HCC) (04/30/2020) POA: unclear etiology. Chronic. Prior CT scans showed hepatic steatosis. Counts stable. Monitor  CKD (chronic kidney disease), stage III (HCC) (04/30/2020) POA: Scr stable. Continue to monitor renal function with diuresis     Pacemaker / Aortic  valve replaced / History of PSVT (paroxysmal supraventricular tachycardia) (05/09/2021) POA: she has a hx of a bioprosthetic AVR. Her pacer is not MRi compatible. Monitor      Hypothyroidism PO: last TSH was 10 in 10/2020. Repeat is normal. Continue Levothyroxine     BMI 50.0-59.9, adult (HCC) (05/09/2021) /  OSA (obstructive sleep apnea) (05/09/2021) POA: resume BiPAP at night     Anemia (05/09/2021) POA: likely due to chronic illness. Monitor     Anxiety and depression POA: resume Celexa.     Hyperlipidemia POA: continue Lipitor    Care Plan discussed with: Patient, Nursing, CM     Prophylaxis:  Hep SQ                Anticipated Disposition:  home w/ HH     PCP:      Barrett, Lance Sell, NP     I have personally examined and treated the patient at bedside during this period. To assist coordination of care and communication with nursing and staff, this note may be preliminary early in the day, but finalized by end of the day.         ___________________________________________________    Attending Physician:   Melynda Keller, MD

## 2021-05-15 NOTE — Progress Notes (Signed)
3295- notified primary RN of critical lab result co2 42

## 2021-05-15 NOTE — Progress Notes (Signed)
Problem: Self Care Deficits Care Plan (Adult)  Goal: *Acute Goals and Plan of Care (Insert Text)  Description: FUNCTIONAL STATUS PRIOR TO ADMISSION: Patient was modified independent using a single point cane for functional mobility. She reports able to care for self prior to recent hospitalization and rehab stay.    HOME SUPPORT: The patient lived with son and daughter in law but did not require assist.    Occupational Therapy Goals  Initiated 05/13/2021  1.  Patient will perform lower body dressing with modified independence within 7 day(s).  2.  Patient will perform lower body dressing with modified independence within 7 day(s).  3.  Patient will perform toilet transfers with modified independence within 7 day(s).  4.  Patient will perform all aspects of toileting with modified independence within 7 day(s).  5.  Patient will participate in upper extremity therapeutic exercise/activities with supervision/set-up for 10 minutes within 7 day(s).    6.  Patient will utilize energy conservation techniques during functional activities with verbal cues within 7 day(s).    Outcome: Progressing Towards Goal     OCCUPATIONAL THERAPY TREATMENT  Patient: Deborah [name] (66 y.o. female)  Date: 05/15/2021  Diagnosis: Acute on chronic heart failure with preserved ejection fraction (HFpEF) (HCC) [I50.33] Acute on chronic heart failure with preserved ejection fraction (HFpEF) (HCC)      Precautions: Fall  Chart, occupational therapy assessment, plan of care, and goals were reviewed.    ASSESSMENT  Patient received seated in recliner A&OX4 and agreeable for OT tx. Patient continues with skilled OT services and is progressing towards goals. Patient educated on LB dressing technique with pt demonstrating supervision to doff socks and donn new socks while side sitting at EOB with LE propped up on bed. Patient educated on energy conservation techniques with pt demonstrating and verbalizing understanding. Patient noted to be slightly SOB  with activity however unable to obtain SPO2 2/2 uneven waveform on monitor (nursing informed and aware) however pt feeling better once laying in bed. Patient is supervision for sit<->stand with SPC from recliner and SBA for recliner->bed and supine in bed. Patient would benefit from continued skilled OT services while at [facility] in order to increase safety and independence with self care and functional transfers/mobility. Recommend discharge to home with [facility] when medically appropriate.     Other factors to consider for discharge: time since onset, close to baseline functional status         PLAN :  Patient continues to benefit from skilled intervention to address the above impairments.  Continue treatment per established plan of care to address goals.    Recommend with staff: assist with toilet transfers, bathing at sink    Recommend next OT session: continue to progress towards goals    Recommendation for discharge: (in order for the patient to meet his/her long term goals)  Occupational therapy at least 2 days/week in the home     This discharge recommendation:  Has been made in collaboration with the attending provider and/or case management    IF patient discharges home will need the following DME: TBD       SUBJECTIVE:   Patient stated "I think I am going to take a nap."    OBJECTIVE DATA SUMMARY:   Cognitive/Behavioral Status:  Neurologic State: Alert  Orientation Level: Oriented X4  Cognition: Follows commands     Functional Mobility and Transfers for ADLs:  Bed Mobility:  Rolling: Supervision  Supine to Sit: Supervision  Scooting: Supervision  Transfers:  Sit to Stand: Supervision     Bed to Chair: Supervision    Balance:  Sitting: Intact  Standing: Intact;With support    ADL Intervention:     Lower Body Dressing Assistance  Socks: Supervision  Position Performed: Seated edge of bed    Therapeutic Exercises:   Patient stating she has been completing bil UE HEP on own throughout the day    Pain:  No pain  reported    Activity Tolerance:   Good    After treatment patient left in no apparent distress:   Supine in bed and Call bell within reach    COMMUNICATION/COLLABORATION:   The patient's plan of care was discussed with: Registered nurse.     Madolyn Frieze  Time Calculation: 16 mins

## 2021-05-15 NOTE — Progress Notes (Signed)
Transition of Care Plan: RUR-29%, LOS 6 days  Medical management continues  PT/OT following--home O2 eval: pt was < 88% on 6L O2 with activity; will need another eval prior to d/c  Dietitian following  Cardiology following--on IV Ceftriaxone through 4/23  Pt to be followed by [facility]  CM following for d/c needs  Family to transport home at d/c  L.Dorothyann Gibbs, RN

## 2021-05-15 NOTE — Progress Notes (Signed)
Comprehensive Nutrition Assessment    Type and Reason for Visit: Initial, RD nutrition re-screen/LOS    Nutrition Recommendations/Plan:   Provide Gelatein once daily to increase kcal/protein intake (80 kcal, <1 g carbs, 20 g protein)   Provide Ensure Pudding once daily to aid in kcal/protein intake (240 kcal, 24 g carbs, 12 g protein)  Obtain daily measured weight & document on whiteboard  POST 1500 mL Fluid Restriction signs in room for staff/vistors     Malnutrition Assessment:  Malnutrition Status:  Mild malnutrition (05/15/21 1240)    Context:  Chronic illness     Findings of the 6 clinical characteristics of malnutrition:   Energy Intake:  Mild decrease in energy intake (specify), doesn't eat meats on most meals 2/2 dislike  Weight Loss:  No significant weight loss  - too much fluid    Body Fat Loss:  No significant body fat loss,   examining upper body & head/neck - no physical signs of malnutrition   Muscle Mass Loss:  No significant muscle mass loss,    Fluid Accumulation:  Severe, Generalized, Extremities - bilateral lower extremities are tight with fluid, increased breathing  Grip Strength:  Not performed     Nutrition Assessment:      4/18: pt seen by RD for LOS. RD attended & discussed patient during interdisciplinary rounds on unit and completed Nutrition Focused Physical Exam with above findings. RD interviews patient and finds she doesn't prefer most meats. She states when she wants meat, she makes oatmeal with milk and adds cinnamon & brown sugar. She is educated on having some protein daily and continuing a moderate protein supplement such as the items we will provide today once daily. Pt does have poor denture with several missing/broken teeth, which is likely contributing to her preference for softer foods. Pt has medications for BG control, but states she doesn't have diabetes.      Last 3 Recorded Weights in this Encounter    05/13/21 0311 05/14/21 0402 05/15/21 0340   Weight: 157.6 kg (347 lb  6.4 oz) 157.5 kg (347 lb 3.2 oz) (!) 161.4 kg (355 lb 12.8 oz)       Nutrition Related Findings:      Wound Type: None (none noted on flowsheets)  Last Bowel Movement Date: 05/13/21  Abdominal Assessment: Obese  Appetite: Good  Bowel Sounds: Active     Edema:LLE: 3+ (05/14/2021  7:40 PM)  LUE: Trace (05/14/2021  7:40 PM)  RLE: 3+ (05/14/2021  7:40 PM)  RUE: Trace (05/14/2021  7:40 PM)    Nutr. Labs:  Lab Results   Component Value Date/Time    GFR est AA 56 (L) 08/26/2020 04:25 PM    GFR est non-AA 46 (L) 08/26/2020 04:25 PM    Creatinine 1.24 (H) 05/15/2021 01:56 AM    BUN 28 (H) 05/15/2021 01:56 AM    Sodium 135 (L) 05/15/2021 01:56 AM    Potassium 4.5 05/15/2021 01:56 AM    Chloride 95 (L) 05/15/2021 01:56 AM    CO2 42 (HH) 05/15/2021 01:56 AM     Lab Results   Component Value Date/Time    Glucose 115 (H) 05/15/2021 01:56 AM    Glucose (POC) 139 (H) 05/15/2021 11:38 AM     Lab Results   Component Value Date/Time    Hemoglobin A1c 5.7 (H) 01/25/2021 04:22 AM    Hemoglobin A1c, External 6.1 07/15/2020 12:00 AM     Magnesium   Date Value Ref Range Status   05/15/2021  1.8 1.6 - 2.4 mg/dL Final   16/10/960404/17/2023 2.3 1.6 - 2.4 mg/dL Final   54/09/811904/16/2023 1.4 (L) 1.6 - 2.4 mg/dL Final   14/78/295604/15/2023 1.9 1.6 - 2.4 mg/dL Final   21/30/865704/14/2023 1.7 1.6 - 2.4 mg/dL Final     Lab Results   Component Value Date/Time    Calcium 8.8 05/15/2021 01:56 AM    Phosphorus 3.0 01/26/2021 03:11 AM     Lab Results   Component Value Date/Time    Cholesterol, total 106 04/30/2020 12:57 AM    HDL Cholesterol 76 04/30/2020 12:57 AM    LDL, calculated 12.6 04/30/2020 12:57 AM    VLDL, calculated 17.4 04/30/2020 12:57 AM    Triglyceride 87 04/30/2020 12:57 AM    CHOL/HDL Ratio 1.4 04/30/2020 12:57 AM       Nutr. Meds:  Diamox, Lipitor, Bumex, Celexa, rocephin, glipizide, Humalog, synthroid, Singulair, nystatin, oxycodone, Carafate  PRN: Zofran, Miralax, senokot    Current Nutrition Intake & Therapies:  Average Meal Intake: 51-75%  Average Supplement Intake:  None ordered - added today  ADULT DIET Regular; Low Sodium (2 gm); 1500 ml  ADULT ORAL NUTRITION SUPPLEMENT Lunch; Protein Modular  ADULT ORAL NUTRITION SUPPLEMENT HS Snack; Fortified Pudding    Documented Meal intake:  Patient Vitals for the past 168 hrs:   % Diet Eaten   05/14/21 1529 76 - 100%   05/14/21 1145 0%   05/12/21 1806 76 - 100%   05/12/21 1250 76 - 100%   05/12/21 0818 76 - 100%     Documentation of supplement intake:  No data found.    Anthropometric Measures:  Height: 5\' 7"  (170.2 cm)  Ideal Body Weight (IBW): 135 lbs (61 kg)  Admission Body Weight: 354 lb  Current Body Wt:  161.4 kg (355 lb 13.2 oz), 263.6 % IBW. Bed scale  Current BMI (kg/m2): 55.7  Usual Body Weight: 145.2 kg (320 lb)  % Weight Change (Calculated): 11.2  Weight Adjustment: No adjustment                 BMI Category: Obese class 3 (BMI 40.0 or greater)    Wt Readings from Last 10 Encounters:   05/15/21 (!) 161.4 kg (355 lb 12.8 oz)   04/24/21 (!) 160.8 kg (354 lb 8 oz)   03/09/21 149.7 kg (330 lb)   02/16/21 151.5 kg (334 lb)   01/26/21 151.8 kg (334 lb 10.5 oz)   12/03/20 154.8 kg (341 lb 3.2 oz)   11/05/20 152 kg (335 lb)   08/26/20 152.4 kg (336 lb)   05/23/20 147.2 kg (324 lb 8 oz)   04/30/20 148.8 kg (328 lb)     Estimated Daily Nutrient Needs:  Energy Requirements Based On: Formula  Weight Used for Energy Requirements: Current  Energy (kcal/day): 2418 kcal/d (MSJ xAF 1.1)  Weight Used for Protein Requirements: Ideal  Protein (g/day): 92 - 122 g (1.5-2 g/kg of IBW)  Method Used for Fluid Requirements: Other (comment)  Fluid (ml/day): 1500 mL FR per MD    Nutrition Diagnosis:   Inadequate protein intake related to biting/chewing (masticatory) difficulty, altered taste perception as evidenced by localized or generalized fluid accumulation, lab values, poor dentition    Nutrition Interventions:   Food and/or Nutrient Delivery: Continue current diet, Start oral nutrition supplement  Nutrition Education/Counseling: Education  completed, Survival skills/brief education completed  Coordination of Nutrition Care: Continue to monitor while inpatient, Interdisciplinary rounds  Plan of Care discussed with: patient and IDR team  Goals:     Goals: PO intake 75% or greater, by next RD assessment       Nutrition Monitoring and Evaluation:   Behavioral-Environmental Outcomes: Readiness for change  Food/Nutrient Intake Outcomes: Food and nutrient intake, Supplement intake  Physical Signs/Symptoms Outcomes: Weight, Meal time behavior, Fluid status or edema    Discharge Planning:    Continue oral nutrition supplement    Kathi Simpers, MS, RD  Contact via [employer] or office [phone]

## 2021-05-15 NOTE — Progress Notes (Signed)
Problem: Mobility Impaired (Adult and Pediatric)  Goal: *Acute Goals and Plan of Care (Insert Text)  Description: FUNCTIONAL STATUS PRIOR TO ADMISSION: Patient was modified independent using a single point cane for household and community amb. Recent hospital admission and d/c to [facility] x last 2+ weeks then readmit    HOME SUPPORT PRIOR TO ADMISSION: The patient lived with son's family but did not require assist.    Physical Therapy Goals  Initiated 05/12/2021  1.  Patient will move from supine to sit and sit to supine , scoot up and down, and roll side to side in bed with modified independence within 7 day(s).    2.  Patient will transfer from bed to chair and chair to bed with modified independence using the least restrictive device within 7 day(s).  3.  Patient will perform sit to stand with modified independence within 7 day(s).  4.  Patient will ambulate with modified independence for 50 feet with the least restrictive device within 7 day(s).   5.  Patient will ascend/descend 5 stairs with 1 handrail(s) and SPC with modified independence within 7 day(s).   Outcome: Progressing Towards Goal   PHYSICAL THERAPY TREATMENT  Patient: Deborah [name] (66 y.o. female)  Date: 05/15/2021  Diagnosis: Acute on chronic heart failure with preserved ejection fraction (HFpEF) (HCC) [I50.33] Acute on chronic heart failure with preserved ejection fraction (HFpEF) (HCC)      Precautions: Fall  Chart, physical therapy assessment, plan of care and goals were reviewed.    ASSESSMENT  Patient continues with skilled PT services and is progressing towards goals. Patient this afternoon continues with oxygen needs exceeding 6L NC with activity. She is received on 4L NC up ad lib utilizing the restroom and desaturating to 84%. Titrated up to 5 and then 6 L with patient continuing to desaturate on this level of oxygen support. She reports SOB as 5/10 on modified BORG dyspnea scale. She ambulates with SPC use this afternoon, though would  recommend continued use of RW over SPC secondary to increased instability and trunk sway. Recommend HH with support upon d/c.      Current Level of Function Impacting Discharge (mobility/balance): supervision    Other factors to consider for discharge: oxygen needs exceeding 6L NC         PLAN :  Patient continues to benefit from skilled intervention to address the above impairments.  Continue treatment per established plan of care.  to address goals.    Recommendation for discharge: (in order for the patient to meet his/her long term goals)  Physical therapy at least 2 days/week in the home     This discharge recommendation:  Has been made in collaboration with the attending provider and/or case management    IF patient discharges home will need the following DME: to be determined (TBD)       SUBJECTIVE:   Patient stated "."    OBJECTIVE DATA SUMMARY:   Patient received up ad lib utilizing the restroom    Critical Behavior:  Neurologic State: Alert  Orientation Level: Oriented X4  Cognition: Follows commands  Safety/Judgement: Awareness of environment  Functional Mobility Training:  Bed Mobility:  Rolling: Supervision  Supine to Sit: Supervision     Scooting: Supervision        Transfers:  Sit to Stand: Supervision  Stand to Sit: Supervision        Bed to Chair: Supervision  Balance:  Sitting: Intact  Standing: Intact;With support  Ambulation/Gait Training:  Distance (ft): 20 Feet (ft)  Assistive Device: Gait belt;Cane, straight  Ambulation - Level of Assistance: Stand-by assistance        Gait Abnormalities: Antalgic;Trunk sway increased        Base of Support: Widened     Speed/Cadence: Pace decreased (<100 feet/min);Shuffled                       Stairs:              Therapeutic Exercises:     Pain Rating:  Patient without reports of pain during therapy      Activity Tolerance:   Fair, desaturates with exertion and requires oxygen, requires rest breaks, and observed SOB with activity    After  treatment patient left in no apparent distress:   Supine in bed, Call bell within reach, Bed / chair alarm activated, and Side rails x 3    COMMUNICATION/COLLABORATION:   The patient's plan of care was discussed with: Occupational therapist and Registered nurse.     Chari Manning PT, DPT   Time Calculation: 15 mins

## 2021-05-15 NOTE — Progress Notes (Signed)
TRANSFER - OUT REPORT:    Verbal report given to Nickie Retort (name) on Deborah [name]  (patient name)  being transferred to 327(unit) for routine progression of care   Report consisted of patient's Situation, Background, Assessment and   Recommendations(SBAR).     Midge Aver, RN, MSN/Care manager  [phone]

## 2021-05-15 NOTE — Progress Notes (Signed)
Progress Notes by Sharla Kidney, NP at 05/15/21 [phone]                Author: Sharla Kidney, NP  Service: Cardiology  Author Type: Nurse Practitioner       Filed: 05/15/21 0854  Date of Service: 05/15/21 0851  Status: Attested           Editor: Sharla Kidney, NP (Nurse Practitioner)  Cosigner: Lilli Few, MD at 05/15/21 1239          Attestation signed by Lilli Few, MD at 05/15/21 1239          Pt personally seen and examined. Chart reviewed.       Agree with advanced NP's history, exam and  A/P with changes/additons.       Blood pressure (!) 90/52, pulse 83, temperature 97.8 F (36.6 C), resp. rate 16,  height  (1.702 m), weight (!) 355 lb 12.8 oz (161.4 kg), SpO2 100 %.              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 Diamox added .   Hx Afib s/p PPM ( not MRI compatible)/ S/p bioprosthetic AVR- intermittent episodes of SVT with atrial tracking V paced rhythm - PPM interrogated - functioning normally - PVCs - no Afib documented recently   Bacteremia - Gp B Strep Has had Pacer lead vegetation in the past - on IV abx- has been seen by ID previously   Hypoxia/Hypercapnia/Resp failure/COPD   DM    CKD  - Cr 1.28   Morbid obesity                   Discussed with patient/nursing/       Marlin Canary. MD, [facility]                                                                                                     [facility] CARDIOLOGY                     Cardiology Care Note       Initial Encounter      Follow-up        Patient Name: Deborah [name] - DOB:01-Jul-1955 - RUE:[id number]   Primary Cardiologist: Sondra Barges Cardiology Physicians: Thurston Pounds MD   Consulting Cardiologist: Sondra Barges Cardiology Physicians: Drue Second MD        Reason for encounter: CHF       HPI:         Deborah [name] is a 66  y.o. female with PMH of HF p EF, a fib, possible atrial lead vegetation treated abx, repeat ECHO with no evidence of vegetation with COPD, A/C resp failure who was recently discharged from hospital on  04/24/2021 to [facility] for rehab.       She says that she had not been receiving her Bumex on a consistent basis and missed several days dosing.       She started having a progressively worsening SOB associated with lower extremity swelling. She says she was using oxygen but required supplementation in the ED. She denies any cough or fever. No chills, nausea or vomiting.      Subjective:        Deborah [name] reports breathing improved but remains SOB         Assessment and Plan          1 A/C HF p EF: seems as if her diuretics were not being administered at [facility]. pBNP is improved and chest xray unchanged (cont pulm edema).  Cont IV loops  as she continues to have fluid overload, unable to increase d/t low BP episodes, not orthostatic. Consider dose of diamox       2 Hx a fib s/p PPM (not MRI compatible)/ s/p AVR: ectopy noted on tele.  Has had bigemny and intermittent episodes of SVT with Pacer tracking at 150 bpm, episodes have decreased. Pacer interrogated,  functioning normally w/ increased PVC's. Cont metoprolol       3 Bacteremia due to group B Streptococcus: during recent admission, she had GBS bacteremia complicated by pacemaker line vegetation. Continue IV Ceftriaxone scheduled to run through 05/23/21.       4 Acute on chronic respiratory failure with hypoxia and hypercapnia/COPD: On NC        5 Hypokalemia: replete lytes        6 T 2 DM       7 CKD       8 Obesity: Body mass index is 55.73 kg/m.       9 Thrombocytopenia           ____________________________________________________________      Cardiac testing   04/08/21      ECHO ADULT FOLLOW-UP OR LIMITED 04/17/2021 04/17/2021      Interpretation Summary   ?  Aortic Valve: Not well visualized. Edwards bovine bioprosthetic valve with a size of 25 mm.  Mild regurgitation.  Stenosis of the aortic valve. AV mean gradient is 38 mmHg. AV area by continuity VTI is 1.0 cm2.   ?  Technical qualifiers: Echo study was technically difficult due to patient's body habitus.      Signed by: Candyce Churn, DO on 04/17/2021  5:00 PM         02/16/21      NUCLEAR CARDIAC STRESS TEST 02/20/2021 02/26/2021      Interpretation Summary   ?  Nuclear Findings: LV perfusion is normal. There is no evidence of inducible ischemia.   ?  Nuclear Findings: The defect appears to be caused by breast attenuation.   ?  ECG: Resting ECG demonstrates normal sinus rhythm.   ?  ECG: Stress ECG was negative for ischemia.   ?  Stress Test: A pharmacological stress test was performed using lexiscan. Hemodynamics are adequate for diagnosis. Blood pressure demonstrated  a normal response and heart rate demonstrated a normal response to stress. The patient's heart rate recovery was normal. The patient reported dyspnea and no chest pain during the stress test.      Ventricular bigeminy   Unifocal PVC, RBBB and superior axis PVCs      Signed by: Thurston Pounds, MD on 02/20/2021  5:00 PM  Most recent HS troponins:   No results for input(s): TROPHS in the last 72 hours.      No lab exists for component:  CKMB         ECG:      EKG Results                  Procedure  720  Value  Units  Date/Time           EKG, 12 LEAD, INITIAL [[id number]]  Collected: 05/11/21 2056       Order Status: Completed  Updated: 05/14/21 1706                Ventricular Rate  84  BPM           Atrial Rate  84  BPM           P-R Interval  168  ms           QRS Duration  162  ms           Q-T Interval  388  ms           QTC Calculation (Bezet)  458  ms           Calculated P Axis  68  degrees           Calculated R Axis  -102  degrees           Calculated T Axis  11  degrees                Diagnosis  --             Sinus rhythm with frequent atrial-paced complexes and premature    supraventricular complexes in a pattern of bigeminy   Right  bundle branch block   Septal infarct , age undetermined   T wave abnormality, consider lateral ischemia   Abnormal ECG      Confirmed by Barry Dienes, M.D., John ([id number]) on 05/14/2021 5:06:24 PM              EKG, 12 LEAD, INITIAL [[id number]]  Collected: 05/11/21 2005       Order Status: Completed  Updated: 05/14/21 1706                Ventricular Rate  116  BPM           Atrial Rate  116  BPM           P-R Interval  200  ms           QRS Duration  190  ms           Q-T Interval  368  ms           QTC Calculation (Bezet)  511  ms           Calculated R Axis  -72  degrees           Calculated T Axis  105  degrees                Diagnosis  --             Atrial-sensed ventricular-paced rhythm   Abnormal ECG   Confirmed by Barry Dienes, M.D., John ([id number]) on 05/14/2021 5:06:15 PM              EKG, 12 LEAD, INITIAL [[id number]]  Collected: 05/11/21 1531       Order Status: Completed  Updated: 05/14/21 1706  Ventricular Rate  83  BPM           Atrial Rate  83  BPM           P-R Interval  160  ms           QRS Duration  160  ms           Q-T Interval  368  ms           QTC Calculation (Bezet)  432  ms           Calculated P Axis  64  degrees           Calculated R Axis  -100  degrees           Calculated T Axis  16  degrees                Diagnosis  --             Sinus rhythm with frequent atrial-paced complexes and premature    supraventricular complexes in a pattern of bigeminy   Right bundle branch block   Possible Lateral infarct , age undetermined   Abnormal ECG   Confirmed by Barry Dieneswens, M.D., John ([id number]) on 05/14/2021 5:06:02 PM              EKG 12 LEAD INITIAL [[id number]]  Collected: 05/10/21 0635       Order Status: Completed  Updated: 05/12/21 1304                Ventricular Rate  80  BPM           Atrial Rate  60  BPM           P-R Interval  212  ms           QRS Duration  112  ms           Q-T Interval  526  ms           QTC Calculation (Bezet)  606  ms           Calculated P Axis  61  degrees           Calculated R  Axis  -55  degrees           Calculated T Axis  -158  degrees                Diagnosis  --             Atrial-paced rhythm with prolonged AV conduction with frequent premature    ventricular complexes   Incomplete right bundle branch block   Left anterior fascicular block   Septal infarct , age undetermined   T wave abnormality, consider lateral ischemia   Prolonged QT   Abnormal ECG   When compared with ECG of 09-May-2021 20:16,   No significant change      Confirmed by Rathi MD, Vikas ([id number]) on 05/12/2021 1:04:16 PM              EKG, 12 LEAD, INITIAL [[id number]]         Order Status: Sent         EKG 12 LEAD INITIAL [[id number]]  Collected: 05/09/21 2016       Order Status: Completed  Updated: 05/11/21 1349                Ventricular Rate  97  BPM  Atrial Rate  49  BPM           P-R Interval  160  ms           QRS Duration  160  ms           Q-T Interval  350  ms           QTC Calculation (Bezet)  444  ms           Calculated P Axis  67  degrees           Calculated R Axis  -117  degrees           Calculated T Axis  6  degrees                Diagnosis  --             Sinus bradycardia with marked sinus arrhythmia with frequent atrial-paced    complexes and with frequent and consecutive premature ventricular complexes   Right bundle branch block   Possible Lateral infarct , age undetermined   Abnormal ECG   When compared with ECG of 13-Apr-2021 22:14,   premature ventricular complexes are now present   premature atrial complexes are no longer present   Borderline criteria for Lateral infarct are now present   Confirmed by Rathi MD, Vikas ([id number]) on 05/11/2021 1:49:45 PM                           Review of Systems:      All other systems reviewed and all negative except as written in HPI       Patient unable to provide secondary  to condition        Past Medical History:        Diagnosis  Date         ?  (HFpEF) heart failure with preserved ejection fraction (HCC)       ?  Anxiety and depression       ?   Aortic valve replaced            S/p bovine aortic valve replacement.         ?  Asthma       ?  Chronic narcotic use       ?  Chronic obstructive pulmonary disease (HCC)       ?  Chronic pain       ?  CKD (chronic kidney disease), stage III (HCC)            Baseline creatinine is 1.3-1.4 with GFR in the 40s.         ?  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  GERD (gastroesophageal reflux disease)       ?  History of vascular access device  04/13/2021          4 FR Single PICC for LTABX: R cephalic vessell length 48 CM Max P leave @ 1 CM out; Arm circumferenc 40 CM         ?  Hyperlipidemia       ?  Hypothyroidism       ?  Morbid obesity (HCC)       ?  Neuropathy       ?  Obstructive sleep apnea           ?  Rhinitis  Past Surgical History:         Procedure  Laterality  Date          ?  COLONOSCOPY  N/A  12/01/2020          COLONOSCOPY performed by Ian Malkin, MD at [facility] ENDOSCOPY          ?  HX AORTIC VALVE REPLACEMENT              Bovine bioprosthetic          ?  HX PACEMAKER            Social Hx:  reports that she quit smoking about 11 years ago. Her smoking use included cigarettes. She has a 40.00 pack-year smoking history. She has never used smokeless tobacco. She reports that she does not currently use alcohol. She reports that she  does not use drugs.   Family Hx: family history includes Hypertension in her father and mother.     Allergies        Allergen  Reactions         ?  Nitroglycerin  Unknown (comments)             hypotension         ?  Aloe Vera  Rash     ?  Hydrochlorothiazide  Other (comments)             Reports 'kidneys dry up"          ?  Tetanus And Diphther. Tox (Pf)  Swelling             Swelling of arm and it turns black              OBJECTIVE:     Wt Readings from Last 3 Encounters:        05/15/21  (!) 161.4 kg (355 lb 12.8 oz)     04/24/21  (!) 160.8 kg (354 lb 8 oz)        03/09/21  149.7 kg (330 lb)           Intake/Output Summary (Last 24 hours) at 05/15/2021 0852    Last data filed at 05/15/2021 1610     Gross per 24 hour        Intake  200 ml        Output  800 ml        Net  -600 ml              Physical Exam:      Vitals:      Vitals:             05/15/21 0338  05/15/21 0340  05/15/21 0551  05/15/21 0741           BP:  118/66    129/60  (!) 129/59     Pulse:  63    71  79     Resp:  16      20     Temp:  99.4 F (37.4 C)      98.4 F (36.9 C)     SpO2:  93%      96%     Weight:    (!) 161.4 kg (355 lb 12.8 oz)               Height:                Telemetry: SR, paced  Gen: Morbidly obese, in no distress    Neck: Supple, No JVD, No Carotid Bruit   Resp: No accessory muscle use, diminished breath sounds, B/L Basal rales are present.    Card: Regular Rate,Rythm, Normal S1, S2, No murmurs, rubs or gallop.    Abd:   obese, Soft, non-tender, non-distended, BS+    MSK: No cyanosis   Skin: No rashes     Neuro: Moving all four extremities, follows commands appropriately   Psych: Fair insight, oriented to person, place, alert, Nml Affect   LE: 3+ edema      Data Review:       Radiology:    XR Results (most recent):   Results from Hospital Encounter encounter on 05/09/21      XR CHEST PORT      Narrative   PORTABLE CHEST RADIOGRAPH/S: 05/14/2021 8:01 AM      INDICATION: Worsening respiratory status.      COMPARISON: 05/09/2021, 04/15/2021, CT 04/14/2021.      TECHNIQUE: Portable frontal radiograph/s of the chest.      FINDINGS:   The lungs are hypoinflated, and there is pulmonary vascular crowding.   Interstitial and alveolar edema are probably superimposed. The central airways   are patent. No pneumothorax and no large pleural effusion. A right PICC and   pacemaker are in place. The heart is enlarged. Post CABG and aortic valve   replacement.      Impression   Cardiomegaly, pulmonary vascular congestion, probable pulmonary edema.           Recent Labs            05/15/21   0156  05/14/21   0447     NA  135*  134*     K  4.5  4.4     CL  95*  94*     CO2  42*  40*     BUN  28*  27*      CREA  1.24*  1.28*     GLU  115*  135*         CA  8.8  8.8          Recent Labs              05/15/21   0156  05/14/21   0447  05/13/21   0227  05/12/21   1730     WBC   --    --   5.2  6.2     HGB  8.8*  9.1*  8.2*  9.2*     HCT  29.8*  31.0*  27.8*  31.1*           PLT   --   75*  68*  85*        No results for input(s): PTP, INR, AP, INREXT, INREXT in the last 72 hours.      No lab exists for component: PTTP, GPT, SGOT      No results for input(s): CHOL, LDLC in the last 72 hours.      No lab exists for component: TGL, HDLC,  HBA1C         Current meds:      Current Facility-Administered Medications:    ?  nystatin (MYCOSTATIN) 100,000 unit/gram cream, , Topical, BID, Marca Ancona C, NP, Given at 05/13/21 1718   ?  traZODone (DESYREL) tablet 100 mg, 100 mg, Oral, QHS, Horald Chestnut, DO, 100 mg at 05/14/21 2203   ?  oxyCODONE IR (ROXICODONE) tablet 5 mg, 5 mg, Oral, Q6H PRN, Horald Chestnut, DO, 5 mg at 05/15/21 0153   ?  metoprolol succinate (TOPROL-XL) XL tablet 50 mg, 50 mg, Oral, DAILY, Barb Shear, Georga Hacking, NP, 50 mg at 05/15/21 1610   ?  albuterol-ipratropium (DUO-NEB) 2.5 MG-0.5 MG/3 ML, 3 mL, Nebulization, Q4H PRN, Jamil, Nora, DO, 3 mL at 05/13/21 1901   ?  heparin (porcine) injection 5,000 Units, 5,000 Units, SubCUTAneous, Q8H, Horald Chestnut, DO, 5,000 Units at 05/15/21 9604   ?  insulin lispro (HUMALOG) injection, , SubCUTAneous, AC&HS, Horald Chestnut, DO, 3 Units at 05/14/21 1254   ?  glucose chewable tablet 16 g, 4 Tablet, Oral, PRN, Horald Chestnut, DO   ?  glucagon (GLUCAGEN) injection 1 mg, 1 mg, IntraMUSCular, PRN, Jamil, Arna Medici, DO   ?  dextrose 10% infusion 0-250 mL, 0-250 mL, IntraVENous, PRN, Jamil, Arna Medici, DO   ?  arformoteroL (BROVANA) neb solution 15 mcg, 15 mcg, Nebulization, BID RT, Jamil, Arna Medici, DO, 15 mcg at 05/15/21 0730   ?  budesonide (PULMICORT) 500 mcg/2 ml nebulizer suspension, 500 mcg, Nebulization, BID RT, Jamil, Nora, DO, 500 mcg at 05/15/21 0730   ?  sodium chloride (NS) flush 5-40 mL, 5-40  mL, IntraVENous, Q8H, Melynda Keller, MD, 10 mL at 05/15/21 0552   ?  sodium chloride (NS) flush 5-40 mL, 5-40 mL, IntraVENous, PRN, Melynda Keller, MD   ?  acetaminophen (TYLENOL) tablet 650 mg, 650 mg, Oral, Q6H PRN, 650 mg at 05/12/21 0826 **OR** acetaminophen (TYLENOL) suppository 650 mg, 650 mg, Rectal, Q6H PRN, Melynda Keller, MD   ?  polyethylene glycol (MIRALAX) packet 17 g, 17 g, Oral, DAILY PRN, Melynda Keller, MD   ?  senna (SENOKOT) tablet 8.6 mg, 1 Tablet, Oral, DAILY PRN, Melynda Keller, MD   ?  promethazine (PHENERGAN) tablet 12.5 mg, 12.5 mg, Oral, Q6H PRN **OR** ondansetron (ZOFRAN) injection 4 mg, 4 mg, IntraVENous, Q6H PRN, Melynda Keller, MD, 4 mg at 05/13/21 2232   ?  bumetanide (BUMEX) injection 1 mg, 1 mg, IntraVENous, BID, Melynda Keller, MD, 1 mg at 05/15/21 0551   ?  cefTRIAXone (ROCEPHIN) 2 g in 0.9% sodium chloride 20 mL IV syringe, 2 g, IntraVENous, Q24H, Melynda Keller, MD, 2 g at 05/15/21 5409   ?  aspirin chewable tablet 81 mg, 81 mg, Oral, DAILY, Melynda Keller, MD, 81 mg at 05/15/21 8119   ?  atorvastatin (LIPITOR) tablet 10 mg, 10 mg, Oral, DAILY, Melynda Keller, MD, 10 mg at 05/15/21 1478   ?  citalopram (CELEXA) tablet 40 mg, 40 mg, Oral, DAILY, Melynda Keller, MD, 40 mg at 05/15/21 2956   ?  glipiZIDE (GLUCOTROL) tablet 5 mg, 5 mg, Oral, BID WITH MEALS, Melynda Keller, MD, 5 mg at 05/15/21 2130   ?  levothyroxine (SYNTHROID) tablet 150 mcg, 150 mcg, Oral, ACB, Melynda Keller, MD, 150 mcg at 05/15/21 8657   ?  montelukast (SINGULAIR) tablet 10 mg, 10 mg, Oral, QHS, Melynda Keller, MD, 10 mg at 05/14/21 2203   ?  sucralfate (CARAFATE) tablet 1 g, 1 g, Oral, TID, Melynda Keller, MD, 1 g at 05/14/21 2203      Sharla Kidney, NP      [facility] Cardiology   Call center: (P) [phone]  (F) [phone]         CC:Barrett, Lance Sell, NP

## 2021-05-15 NOTE — Progress Notes (Signed)
Bedside and Verbal shift change report given to [facility] (Cabin crew) by Leeroy Cha (offgoing nurse). Report included the following information SBAR, Kardex, ED Summary, Procedure Summary, Intake/Output, MAR, Recent Results, and Cardiac Rhythm A paced    1710 Pt complaining of nausea.     1740 BP low. Dr Lorette Ang notified. Hold scheduled bumex.    Bedside and Verbal shift change report given to [facility] (Cabin crew) by Wilmon Pali (offgoing nurse). Report included the following information SBAR, Kardex, ED Summary, Procedure Summary, Intake/Output, MAR, Recent Results, and Cardiac Rhythm NSR .

## 2021-05-16 LAB — GLUCOSE, POC
Glucose (POC): 115 mg/dL (ref 65–117)
Glucose (POC): 110 mg/dL (ref 65–117)
Glucose (POC): 192 mg/dL — ABNORMAL HIGH (ref 65–117)
Glucose (POC): 141 mg/dL — ABNORMAL HIGH (ref 65–117)
Glucose (POC): 117 mg/dL (ref 65–117)

## 2021-05-16 LAB — MAGNESIUM
Magnesium: 2.1 mg/dL (ref 1.6–2.4)
Magnesium: 2.1 mg/dL (ref 1.6–2.4)

## 2021-05-16 LAB — CBC WITH AUTOMATED DIFF
ABS. BASOPHILS: 0.1 10*3/uL (ref 0.0–0.1)
ABS. EOSINOPHILS: 0.1 10*3/uL (ref 0.0–0.4)
ABS. IMM. GRANS.: 0 10*3/uL (ref 0.00–0.04)
ABS. LYMPHOCYTES: 1.3 10*3/uL (ref 0.8–3.5)
ABS. MONOCYTES: 0.6 10*3/uL (ref 0.0–1.0)
ABS. NEUTROPHILS: 5.3 10*3/uL (ref 1.8–8.0)
ABSOLUTE NRBC: 0 10*3/uL (ref 0.00–0.01)
BASOPHILS: 1 % (ref 0–1)
EOSINOPHILS: 2 % (ref 0–7)
HCT: 29.4 % — ABNORMAL LOW (ref 35.0–47.0)
HGB: 8.7 g/dL — ABNORMAL LOW (ref 11.5–16.0)
IMMATURE GRANULOCYTES: 0 % (ref 0.0–0.5)
LYMPHOCYTES: 18 % (ref 12–49)
MCH: 28.2 PG (ref 26.0–34.0)
MCHC: 29.6 g/dL — ABNORMAL LOW (ref 30.0–36.5)
MCV: 95.5 FL (ref 80.0–99.0)
MONOCYTES: 8 % (ref 5–13)
MPV: 11.7 FL (ref 8.9–12.9)
NEUTROPHILS: 71 % (ref 32–75)
NRBC: 0 PER 100 WBC
PLATELET: 75 10*3/uL — ABNORMAL LOW (ref 150–400)
RBC: 3.08 M/uL — ABNORMAL LOW (ref 3.80–5.20)
RDW: 15.9 % — ABNORMAL HIGH (ref 11.5–14.5)
WBC: 7.4 10*3/uL (ref 3.6–11.0)

## 2021-05-16 LAB — METABOLIC PANEL, BASIC
Anion gap: NEGATIVE mmol/L (ref 5–15)
BUN/Creatinine ratio: 24 — ABNORMAL HIGH (ref 12–20)
BUN: 31 MG/DL — ABNORMAL HIGH (ref 6–20)
CO2: 40 mmol/L — ABNORMAL HIGH (ref 21–32)
Calcium: 8.6 MG/DL (ref 8.5–10.1)
Chloride: 94 mmol/L — ABNORMAL LOW (ref 97–108)
Creatinine: 1.29 MG/DL — ABNORMAL HIGH (ref 0.55–1.02)
Glucose: 134 mg/dL — ABNORMAL HIGH (ref 65–100)
Potassium: 4.2 mmol/L (ref 3.5–5.1)
Sodium: 133 mmol/L — ABNORMAL LOW (ref 136–145)
eGFR: 46 mL/min/{1.73_m2} — ABNORMAL LOW (ref >60)

## 2021-05-16 LAB — NT-PRO BNP: NT pro-BNP: 5338 PG/ML — ABNORMAL HIGH (ref <125)

## 2021-05-16 LAB — POCT GLUCOSE
POC Glucose: 115 mg/dL (ref 65–117)
POC Glucose: 110 mg/dL (ref 65–117)
POC Glucose: 192 mg/dL — ABNORMAL HIGH (ref 65–117)
POC Glucose: 141 mg/dL — ABNORMAL HIGH (ref 65–117)
POC Glucose: 117 mg/dL (ref 65–117)

## 2021-05-16 LAB — BASIC METABOLIC PANEL
Anion Gap: NEGATIVE mmol/L (ref 5–15)
BUN: 31 MG/DL — ABNORMAL HIGH (ref 6–20)
Bun/Cre Ratio: 24 — ABNORMAL HIGH (ref 12–20)
CO2: 40 mmol/L — ABNORMAL HIGH (ref 21–32)
Calcium: 8.6 MG/DL (ref 8.5–10.1)
Chloride: 94 mmol/L — ABNORMAL LOW (ref 97–108)
Creatinine: 1.29 MG/DL — ABNORMAL HIGH (ref 0.55–1.02)
ESTIMATED GLOMERULAR FILTRATION RATE: 46 mL/min/{1.73_m2} — ABNORMAL LOW (ref >60)
Glucose: 134 mg/dL — ABNORMAL HIGH (ref 65–100)
Potassium: 4.2 mmol/L (ref 3.5–5.1)
Sodium: 133 mmol/L — ABNORMAL LOW (ref 136–145)

## 2021-05-16 LAB — CBC WITH AUTO DIFFERENTIAL
Basophils %: 1 % (ref 0–1)
Basophils Absolute: 0.1 10*3/uL (ref 0.0–0.1)
Eosinophils %: 2 % (ref 0–7)
Eosinophils Absolute: 0.1 10*3/uL (ref 0.0–0.4)
Granulocyte Absolute Count: 0 10*3/uL (ref 0.00–0.04)
Hematocrit: 29.4 % — ABNORMAL LOW (ref 35.0–47.0)
Hemoglobin: 8.7 g/dL — ABNORMAL LOW (ref 11.5–16.0)
Immature Granulocytes: 0 % (ref 0.0–0.5)
Lymphocytes %: 18 % (ref 12–49)
Lymphocytes Absolute: 1.3 10*3/uL (ref 0.8–3.5)
MCH: 28.2 PG (ref 26.0–34.0)
MCHC: 29.6 g/dL — ABNORMAL LOW (ref 30.0–36.5)
MCV: 95.5 FL (ref 80.0–99.0)
MPV: 11.7 FL (ref 8.9–12.9)
Monocytes %: 8 % (ref 5–13)
Monocytes Absolute: 0.6 10*3/uL (ref 0.0–1.0)
NRBC Absolute: 0 10*3/uL (ref 0.00–0.01)
Neutrophils %: 71 % (ref 32–75)
Neutrophils Absolute: 5.3 10*3/uL (ref 1.8–8.0)
Nucleated RBCs: 0 PER 100 WBC
Platelets: 75 10*3/uL — ABNORMAL LOW (ref 150–400)
RBC: 3.08 M/uL — ABNORMAL LOW (ref 3.80–5.20)
RDW: 15.9 % — ABNORMAL HIGH (ref 11.5–14.5)
WBC: 7.4 10*3/uL (ref 3.6–11.0)

## 2021-05-16 LAB — PROBNP, N-TERMINAL: BNP: 5338 PG/ML — ABNORMAL HIGH (ref <125)

## 2021-05-16 MED ORDER — BUMETANIDE 1 MG TAB
1 mg | Freq: Two times a day (BID) | ORAL | Status: DC
Start: 2021-05-16 — End: 2021-05-17
  Administered 2021-05-16 (×2): via ORAL

## 2021-05-16 MED ORDER — METOPROLOL SUCCINATE SR 25 MG 24 HR TAB
25 mg | Freq: Every day | ORAL | Status: AC
Start: 2021-05-16 — End: 2021-05-29
  Administered 2021-05-16: 13:00:00 via ORAL

## 2021-05-16 MED FILL — HEPARIN (PORCINE) 5,000 UNIT/ML IJ SOLN: 5000 unit/mL | INTRAMUSCULAR | Qty: 1

## 2021-05-16 MED FILL — TRAZODONE 50 MG TAB: 50 mg | ORAL | Qty: 2

## 2021-05-16 MED FILL — ATORVASTATIN 10 MG TAB: 10 mg | ORAL | Qty: 1

## 2021-05-16 MED FILL — GLIPIZIDE 5 MG TAB: 5 mg | ORAL | Qty: 1

## 2021-05-16 MED FILL — CARAFATE 1 GRAM TABLET: 1 gram | ORAL | Qty: 1

## 2021-05-16 MED FILL — NYSTATIN 100,000 UNIT/G TOPICAL CREAM: 100000 unit/gram | CUTANEOUS | Qty: 15

## 2021-05-16 MED FILL — METOPROLOL SUCCINATE SR 25 MG 24 HR TAB: 25 mg | ORAL | Qty: 1

## 2021-05-16 MED FILL — ARFORMOTEROL 15 MCG/2 ML NEB SOLUTION: 15 mcg/2 mL | RESPIRATORY_TRACT | Qty: 2

## 2021-05-16 MED FILL — OXYCODONE 5 MG TAB: 5 mg | ORAL | Qty: 1

## 2021-05-16 MED FILL — BUMETANIDE 1 MG TAB: 1 mg | ORAL | Qty: 1

## 2021-05-16 MED FILL — INSULIN LISPRO 100 UNIT/ML INJECTION: 100 unit/mL | SUBCUTANEOUS | Qty: 1

## 2021-05-16 MED FILL — BUDESONIDE 0.5 MG/2 ML NEB SUSPENSION: 0.5 mg/2 mL | RESPIRATORY_TRACT | Qty: 1

## 2021-05-16 MED FILL — CITALOPRAM 20 MG TAB: 20 mg | ORAL | Qty: 2

## 2021-05-16 MED FILL — ACETAZOLAMIDE 250 MG TAB: 250 mg | ORAL | Qty: 1

## 2021-05-16 MED FILL — ASPIRIN 81 MG CHEWABLE TAB: 81 mg | ORAL | Qty: 1

## 2021-05-16 MED FILL — CEFTRIAXONE 2 GRAM SOLUTION FOR INJECTION: 2 gram | INTRAMUSCULAR | Qty: 2

## 2021-05-16 MED FILL — LEVOTHYROXINE 150 MCG TAB: 150 mcg | ORAL | Qty: 1

## 2021-05-16 MED FILL — BUMETANIDE 0.25 MG/ML IJ SOLN: 0.25 mg/mL | INTRAMUSCULAR | Qty: 4

## 2021-05-16 MED FILL — MONTELUKAST 10 MG TAB: 10 mg | ORAL | Qty: 1

## 2021-05-16 NOTE — Progress Notes (Signed)
Problem: Pressure Injury - Risk of  Goal: *Prevention of pressure injury  Description: Document Braden Scale and appropriate interventions in the flowsheet.  Outcome: Progressing Towards Goal  Note: Pressure Injury Interventions:  Sensory Interventions: Assess changes in LOC    Moisture Interventions: Absorbent underpads    Activity Interventions: PT/OT evaluation    Mobility Interventions: PT/OT evaluation    Nutrition Interventions: Document food/fluid/supplement intake    Friction and Shear Interventions: Transferring/repositioning devices                Problem: Patient Education: Go to Patient Education Activity  Goal: Patient/Family Education  Outcome: Progressing Towards Goal     Problem: Falls - Risk of  Goal: *Absence of Falls  Description: Document Schmid Fall Risk and appropriate interventions in the flowsheet.  Outcome: Progressing Towards Goal  Note: Fall Risk Interventions:  Mobility Interventions: Assess mobility with egress test         Medication Interventions: Assess postural VS orthostatic hypotension, Bed/chair exit alarm    Elimination Interventions: Bed/chair exit alarm    History of Falls Interventions: Consult care management for discharge planning         Problem: Patient Education: Go to Patient Education Activity  Goal: Patient/Family Education  Outcome: Progressing Towards Goal     Problem: Breathing Pattern - Ineffective  Goal: *Absence of hypoxia  Outcome: Progressing Towards Goal  Goal: *Use of effective breathing techniques  Outcome: Progressing Towards Goal     Problem: Patient Education: Go to Patient Education Activity  Goal: Patient/Family Education  Outcome: Progressing Towards Goal     Problem: TIA/CVA Stroke: 0-24 hours  Goal: Off Pathway (Use only if patient is Off Pathway)  Outcome: Progressing Towards Goal  Goal: Activity/Safety  Outcome: Progressing Towards Goal  Goal: Consults, if ordered  Outcome: Progressing Towards Goal  Goal: Diagnostic Test/Procedures  Outcome:  Progressing Towards Goal  Goal: Nutrition/Diet  Outcome: Progressing Towards Goal  Goal: Discharge Planning  Outcome: Progressing Towards Goal  Goal: Medications  Outcome: Progressing Towards Goal  Goal: Respiratory  Outcome: Progressing Towards Goal  Goal: Treatments/Interventions/Procedures  Outcome: Progressing Towards Goal  Goal: Minimize risk of bleeding post-thrombolytic infusion  Outcome: Progressing Towards Goal  Goal: Monitor for complications post-thrombolytic infusion  Outcome: Progressing Towards Goal  Goal: Psychosocial  Outcome: Progressing Towards Goal  Goal: *Hemodynamically stable  Outcome: Progressing Towards Goal  Goal: *Neurologically stable  Description: Absence of additional neurological deficits    Outcome: Progressing Towards Goal  Goal: *Verbalizes anxiety and depression are reduced or absent  Outcome: Progressing Towards Goal  Goal: *Absence of Signs of Aspiration on Current Diet  Outcome: Progressing Towards Goal  Goal: *Absence of deep venous thrombosis signs and symptoms(Stroke Metric)  Outcome: Progressing Towards Goal  Goal: *Ability to perform ADLs and demonstrates progressive mobility and function  Outcome: Progressing Towards Goal  Goal: *Stroke education started(Stroke Metric)  Outcome: Progressing Towards Goal  Goal: *Dysphagia screen performed(Stroke Metric)  Outcome: Progressing Towards Goal  Goal: *Rehab consulted(Stroke Metric)  Outcome: Progressing Towards Goal     Problem: TIA/CVA Stroke: Day 2 Until Discharge  Goal: Off Pathway (Use only if patient is Off Pathway)  Outcome: Progressing Towards Goal  Goal: Activity/Safety  Outcome: Progressing Towards Goal  Goal: Diagnostic Test/Procedures  Outcome: Progressing Towards Goal  Goal: Nutrition/Diet  Outcome: Progressing Towards Goal  Goal: Discharge Planning  Outcome: Progressing Towards Goal  Goal: Medications  Outcome: Progressing Towards Goal  Goal: Respiratory  Outcome: Progressing Towards Goal  Goal:  Treatments/Interventions/Procedures  Outcome: Progressing Towards Goal  Goal: Psychosocial  Outcome: Progressing Towards Goal  Goal: *Verbalizes anxiety and depression are reduced or absent  Outcome: Progressing Towards Goal  Goal: *Absence of aspiration  Outcome: Progressing Towards Goal  Goal: *Absence of deep venous thrombosis signs and symptoms(Stroke Metric)  Outcome: Progressing Towards Goal  Goal: *Optimal pain control at patient's stated goal  Outcome: Progressing Towards Goal  Goal: *Tolerating diet  Outcome: Progressing Towards Goal  Goal: *Ability to perform ADLs and demonstrates progressive mobility and function  Outcome: Progressing Towards Goal  Goal: *Stroke education continued(Stroke Metric)  Outcome: Progressing Towards Goal     Problem: Ischemic Stroke: Discharge Outcomes  Goal: *Verbalizes anxiety and depression are reduced or absent  Outcome: Progressing Towards Goal  Goal: *Verbalize understanding of risk factor modification(Stroke Metric)  Outcome: Progressing Towards Goal  Goal: *Hemodynamically stable  Outcome: Progressing Towards Goal  Goal: *Absence of aspiration pneumonia  Outcome: Progressing Towards Goal  Goal: *Aware of needed dietary changes  Outcome: Progressing Towards Goal  Goal: *Verbalize understanding of prescribed medications including anti-coagulants, anti-lipid, and/or anti-platelets(Stroke Metric)  Outcome: Progressing Towards Goal  Goal: *Tolerating diet  Outcome: Progressing Towards Goal  Goal: *Aware of follow-up diagnostics related to anticoagulants  Outcome: Progressing Towards Goal  Goal: *Ability to perform ADLs and demonstrates progressive mobility and function  Outcome: Progressing Towards Goal  Goal: *Absence of DVT(Stroke Metric)  Outcome: Progressing Towards Goal  Goal: *Absence of aspiration  Outcome: Progressing Towards Goal  Goal: *Optimal pain control at patient's stated goal  Outcome: Progressing Towards Goal  Goal: *Home safety concerns addressed  Outcome:  Progressing Towards Goal  Goal: *Describes available resources and support systems  Outcome: Progressing Towards Goal  Goal: *Verbalizes understanding of activation of EMS(911) for stroke symptoms(Stroke Metric)  Outcome: Progressing Towards Goal  Goal: *Understands and describes signs and symptoms to report to providers(Stroke Metric)  Outcome: Progressing Towards Goal  Goal: *Neurolgocially stable (absence of additional neurological deficits)  Outcome: Progressing Towards Goal  Goal: *Verbalizes importance of follow-up with primary care physician(Stroke Metric)  Outcome: Progressing Towards Goal  Goal: *Smoking cessation discussed,if applicable(Stroke Metric)  Outcome: Progressing Towards Goal  Goal: *Depression screening completed(Stroke Metric)  Outcome: Progressing Towards Goal

## 2021-05-16 NOTE — Progress Notes (Signed)
Progress Notes by [name], MD at 05/16/21 [phone]                Author: [name], MD  Service: Internal Medicine  Author Type: Physician       Filed: 05/16/21 0909  Date of Service: 05/16/21 0748  Status: Signed          Editor: [name], MD (Physician)                    [facility]   [address] [place], [place]  [address]   [phone]             Hospitalist Progress Note            NAME:  Deborah [name]    DOB:  09/17/1955    MRN:  [id number]      Date/Time:  05/16/2021       Patient PCP:  [name], NP      Emergency Contact:     Extended Emergency Contact Information   Primary Emergency Contact: [facility] Phone: [phone]   Mobile Phone: [phone]   Relation: Son   Interpreter needed? No        Code: Full Code       Isolation Precautions: There are currently no Active Isolations                Subjective:        REASON FOR VISIT:  Recheck CHF      HPI & INTERVAL HISTORY:      Deborah [name] is a 66 y.o. female with history that includes HFpEF, COPD, DM, and HTN presents with shortness of breath due to CHF.      4/19: Reports some imrpovement with her breathing at rest/ Still with edema.  Denies CP and abd pain.         ALLERGIES     Allergies        Allergen  Reactions         ?  Nitroglycerin  Unknown (comments)             hypotension         ?  Aloe Vera  Rash     ?  Hydrochlorothiazide  Other (comments)             Reports 'kidneys dry up"          ?  Tetanus And Diphther. Tox (Pf)  Swelling             Swelling of arm and it turns black                       Objective:         Visit Vitals      BP  (!) 102/56     Pulse  84     Temp  100.1 F (37.8 C)     Resp  20     Ht   (1.702 m)     Wt  154 kg (339 lb 8.1 oz)     SpO2  95%        BMI  53.17 kg/m           General: alert, no distress, and morbidly obese   Head: Normocephalic, without obvious abnormality, atraumatic   Eyes: PERRL, EOMI, anicteric sclerae, and  conjuntiva clear   ENT:  lips, mucosa, and tongue normal   Neck: normal, supple, and no tenderness   Lungs: clear to auscultation with good breath sounds and normal respiratory effort   Heart: S1, S2 normal, regular rate, and regular rhythm   Abd: obese, not distended, soft, nontender, BS present and normactive   Ext: no cyanosis and bilateral lower extremity 3+ edema   Skin: normal skin color, no rashes, and texture normal   Neuro:  alert, oriented x 3, no defects noted in general exam.   Psych: not anxious, cooperative, appropriate affect         Medications:     Current Facility-Administered Medications          Medication  Dose  Route  Frequency           ?  acetaZOLAMIDE (DIAMOX) tablet 250 mg   250 mg  Oral  BID     ?  nystatin (MYCOSTATIN) 100,000 unit/gram cream     Topical  BID     ?  traZODone (DESYREL) tablet 100 mg   100 mg  Oral  QHS     ?  oxyCODONE IR (ROXICODONE) tablet 5 mg   5 mg  Oral  Q6H PRN     ?  metoprolol succinate (TOPROL-XL) XL tablet 50 mg   50 mg  Oral  DAILY     ?  albuterol-ipratropium (DUO-NEB) 2.5 MG-0.5 MG/3 ML   3 mL  Nebulization  Q4H PRN     ?  heparin (porcine) injection 5,000 Units   5,000 Units  SubCUTAneous  Q8H     ?  insulin lispro (HUMALOG) injection     SubCUTAneous  AC&HS     ?  glucose chewable tablet 16 g   4 Tablet  Oral  PRN     ?  glucagon (GLUCAGEN) injection 1 mg   1 mg  IntraMUSCular  PRN     ?  dextrose 10% infusion 0-250 mL   0-250 mL  IntraVENous  PRN     ?  arformoteroL (BROVANA) neb solution 15 mcg   15 mcg  Nebulization  BID RT     ?  budesonide (PULMICORT) 500 mcg/2 ml nebulizer suspension   500 mcg  Nebulization  BID RT     ?  sodium chloride (NS) flush 5-40 mL   5-40 mL  IntraVENous  Q8H     ?  sodium chloride (NS) flush 5-40 mL   5-40 mL  IntraVENous  PRN     ?  acetaminophen (TYLENOL) tablet 650 mg   650 mg  Oral  Q6H PRN          Or           ?  acetaminophen (TYLENOL) suppository 650 mg   650 mg  Rectal  Q6H PRN     ?  polyethylene glycol (MIRALAX)  packet 17 g   17 g  Oral  DAILY PRN     ?  senna (SENOKOT) tablet 8.6 mg   1 Tablet  Oral  DAILY PRN     ?  promethazine (PHENERGAN) tablet 12.5 mg   12.5 mg  Oral  Q6H PRN          Or           ?  ondansetron (ZOFRAN) injection 4 mg   4 mg  IntraVENous  Q6H PRN     ?  bumetanide (BUMEX) injection 1 mg   1 mg  IntraVENous  BID     ?  cefTRIAXone (ROCEPHIN) 2 g in 0.9% sodium chloride 20 mL IV syringe   2 g  IntraVENous  Q24H     ?  aspirin chewable tablet 81 mg   81 mg  Oral  DAILY     ?  atorvastatin (LIPITOR) tablet 10 mg   10 mg  Oral  DAILY           ?  citalopram (CELEXA) tablet 40 mg   40 mg  Oral  DAILY           ?  glipiZIDE (GLUCOTROL) tablet 5 mg   5 mg  Oral  BID WITH MEALS     ?  levothyroxine (SYNTHROID) tablet 150 mcg   150 mcg  Oral  ACB     ?  montelukast (SINGULAIR) tablet 10 mg   10 mg  Oral  QHS           ?  sucralfate (CARAFATE) tablet 1 g   1 g  Oral  TID            Labs:     Recent Labs           05/16/21   0032     WBC  7.4     HGB  8.7*     HCT  29.4*        PLT  75*          Recent Labs           05/16/21   0032     NA  133*     K  4.2     CL  94*     CO2  40*     GLU  134*     BUN  31*     CREA  1.29*     CA  8.6        MG  2.1           Reviewed:             Radiology:   No results found.            The External notes reviewed:, ER provider's note, labs, imaging studies, medications, consultants notes, and latest ancillary notes was reviewed by me on:  May 16, 2021                  Assessment/Plan:          Acute on chronic heart failure with preserved ejection fraction POA     Appears to be due to inconsistent administration of medications     Echo on 03/2021 showing an EF of 50 to 55%     Remains fluid overloaded     Sioft BPs limiting diuresis. Continue Bumex and Diamox added     Continue low-sodium diet and fluid restrictions     Cardiology following and notes reviewed      Acute on chronic respiratory failure with hypoxia and hypercapnia POA     Due to CHF     Continue  supplemental oxygen and treating as above      Bacteremia due to group B Streptococcus (05/09/2021) POA     Had GBS bacteremia complicated by pacemaker vegetation on previous admission     Continue ceftriaxone through 426/23     Continue to monitor seems to have had low-grade temp last night      COPD POA     Not in exacerbation.  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     Has bioprosthetic AVR     Pacer not MRI compatible      Hypothyroidism     TSH within normal limits continue levothyroxine current dose      Anemia POA     Appears to be AOCD      Anxiety and depression POA     Stable continue Celexa      HLD POA     Continue Lipitor      Body mass index is 53.17 kg/m.:  40 or above:  Morbid obesity and Weight loss advised      F: None   E: Hyponatremia   N: ADULT DIET Regular; Low Sodium (2 gm); 1500 ml   ADULT ORAL NUTRITION SUPPLEMENT Lunch; Protein Modular   ADULT ORAL NUTRITION SUPPLEMENT HS Snack; Fortified Pudding          Risk of deterioration: High      Discussed:  Pt's condition, Imaging findings, Lab findings, Assessment, and Care Plan discussed with: Patient, RN, and Care Manager      Prophylaxis:  Hep SQ      Anticipated discharge disposition:  HHC      HR DC Barriers: Currently not medically stable for discharge         Total time: -35- minutes **I personally saw and examined the patient during this time period**                         ___________________________________________________      Signed:    [name], MD

## 2021-05-16 NOTE — Progress Notes (Signed)
Spiritual Care Assessment/Progress Note  [facility]      NAME: Deborah [name]      MRN: [id number]  AGE: 66 y.o. SEX: female  Religious Affiliation: Other   Language: English     05/16/2021     Total Time (in minutes): 13     Spiritual Assessment begun in [facility] 3 PROG CARE TELE 2 through conversation with:         [x] Patient        []  Family    []  Friend(s)        Reason for Consult: Initial/Spiritual assessment, patient floor     Spiritual beliefs: (Please include comment if needed)     [x]  Identifies with a faith tradition:         []  Supported by a faith community:            []  Claims no spiritual orientation:           []  Seeking spiritual identity:                []  Adheres to an individual form of spirituality:           []  Not able to assess:                           Identified resources for coping:      []  Prayer                               []  Music                  []  Guided Imagery     [x]  Family/friends                 []  Pet visits     []  Devotional reading                         []  Unknown     []  Other:                                                Interventions offered during this visit: (See comments for more details)    Patient Interventions: Initial visit           Plan of Care:     []  Support spiritual and/or cultural needs    []  Support AMD and/or advance care planning process      []  Support grieving process   []  Coordinate Rites and/or Rituals    []  Coordination with community clergy   []  No spiritual needs identified at this time   []  Detailed Plan of Care below (See Comments)  []  Make referral to Music Therapy  []  Make referral to Pet Therapy     []  Make referral to Addiction services  []  Make referral to [facility] Passages  []  Make referral to Spiritual Care Partner  []  No future visits requested        [x]  Contact Spiritual Care for further referrals     Comments:  Chaplain's visit was in 327 for initial spiritual assessment. Patient was sitting in her recliner and appeared tired  and sleepy. She indicated she was tired, when chaplain inquired. She did  not share much when chaplain attempted a conversation. She affirmed that faith was important and she has a good support system. Her thoughts and feelings affirmed, she was advised of chaplain availability. Patient thanked chaplain for the visit.       Visited by: Grace Bushy  To page chaplain: [phone]  [phone])

## 2021-05-16 NOTE — Progress Notes (Signed)
Bedside and Verbal shift change report given to Swift (oncoming nurse) by Hoyt Koch RN (offgoing nurse). Report included the following information SBAR and Kardex.

## 2021-05-16 NOTE — Progress Notes (Signed)
Problem: Pressure Injury - Risk of  Goal: *Prevention of pressure injury  Description: Document Braden Scale and appropriate interventions in the flowsheet.  Outcome: Progressing Towards Goal  Note: Pressure Injury Interventions:  Sensory Interventions: Assess changes in LOC    Moisture Interventions: Absorbent underpads    Activity Interventions: PT/OT evaluation    Mobility Interventions: PT/OT evaluation    Nutrition Interventions: Document food/fluid/supplement intake    Friction and Shear Interventions: Transferring/repositioning devices                Problem: Falls - Risk of  Goal: *Absence of Falls  Description: Document Schmid Fall Risk and appropriate interventions in the flowsheet.  Outcome: Progressing Towards Goal  Note: Fall Risk Interventions:  Mobility Interventions: Assess mobility with egress test         Medication Interventions: Assess postural VS orthostatic hypotension, Bed/chair exit alarm    Elimination Interventions: Bed/chair exit alarm    History of Falls Interventions: Consult care management for discharge planning         Problem: Breathing Pattern - Ineffective  Goal: *Absence of hypoxia  Outcome: Progressing Towards Goal  Goal: *Use of effective breathing techniques  Outcome: Progressing Towards Goal

## 2021-05-16 NOTE — Progress Notes (Signed)
Occupational Therapy Contact Note  05/16/21    Patient sleeping very soundly on entry. Attempted to work with pt, minimally alerting to tactile/verbal stimuli but reporting she did not rest well last night and politely requesting to rest and for OT to follow up tomorrow.     Thank you,  Berlin Hun, OTD, OTR/L

## 2021-05-16 NOTE — Progress Notes (Signed)
05/16/21  10:55 AM    Care Management Daily Progress Note:    1. Acute on chronic congestive heart failure, unspecified heart failure type (HCC)    2. Acute on chronic respiratory failure with hypoxia and hypercapnia (HCC) [J96.21, J96.22 (ICD-10-CM)]    3. Bacteremia due to group B Streptococcus [R78.81, B95.1 (ICD-10-CM)]    4. BMI 50.0-59.9, adult (HCC) Valja.Cleaver (ICD-10-CM)]    5. Stage 3b chronic kidney disease (HCC) [Q49.20 (ICD-10-CM)]    6. History of PSVT (paroxysmal supraventricular tachycardia) [Z86.79 (ICD-10-CM)]    7. Mixed hyperlipidemia [E78.2 (ICD-10-CM)]      RUR: 30%  LOS: 7D- expected dc- 4/20 or 05/18/21    Transition of Care Plan:   Medical management continues  PT/OT following--home O2 eval: pt was < 88% on 6L O2 with activity; will need another eval prior to d/c  Dietitian following  Cardiology following--on IV Ceftriaxone through 4/23  Pt to be followed by [facility]  CM following for d/c needs  Family to transport home at d/c    [facility]

## 2021-05-16 NOTE — Progress Notes (Signed)
Progress Notes by Deborah [name], Deborah Jiminez M, NP at 05/16/21 [phone]                Author: Sharla [name], Deborah [name] M, NP  Service: Cardiology  Author Type: Nurse Practitioner       Filed: 05/16/21 0834  Date of Service: 05/16/21 0832  Status: Attested           Editor: Deborah [name], Jayzen Paver M, NP (Nurse Practitioner)  Cosigner: Deborah [name], Deborah [name], [name] at 05/16/21 1338          Attestation signed by Deborah [name], Deborah [name], [name] at 05/16/21 1338          Pt personally seen and examined. Chart reviewed.       Agree with advanced NP's history, exam and  A/P with changes/additons.       Blood pressure 116/71, pulse 64, temperature 98.7 F (37.1 C), resp. rate 21, height 5\' 7"  (1.702 [name]), weight 339 lb 8.1 oz (154 kg), SpO2 100 %.                  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 [name], [facility]                                                                                                     [place] [facility]                     Cardiology Care Note       [] Initial Encounter      [x] Follow-up        Patient Name: Deborah [name] - DOB:24-Jul-1955 - HYQ:[id number]   Primary Cardiologist: Deborah [name]: Deborah [name]   Consulting Cardiologist: Deborah [name]: Deborah [name]        Reason for encounter: CHF       HPI:          Deborah Deborah [name] is a 66 y.o. female with PMH of HF p EF, a fib, possible atrial lead vegetation treated abx, repeat ECHO with no evidence of vegetation with COPD, A/C resp failure who was recently discharged from  hospital on  04/24/2021 to [facility] for rehab.       She says that she had not been receiving her Bumex on a consistent basis and missed several days dosing.       She started having a progressively worsening SOB associated with lower extremity swelling. She says she was using oxygen but required supplementation in the ED. She denies any cough or fever. No chills, nausea or vomiting.      Subjective:        Deborah [name] is up in chair, cont to feel improvement.          Assessment and Plan          1 A/C HF p EF: seems as if her diuretics were not being administered at SNF. pBNP is improved and chest xray unchanged (cont pulm edema).  Trial PO diuretics  today, cont diamox for today       2 Hx a fib s/p PPM (not MRI compatible)/ s/p AVR: ectopy noted on tele.  Has had bigemny and intermittent episodes of SVT with Pacer tracking at 150 bpm, episodes have decreased. Pacer interrogated,  functioning normally w/ increased PVC's. Cont metoprolol       3 Bacteremia due to group B Streptococcus: during recent admission, she had GBS bacteremia complicated by pacemaker line vegetation. Continue IV Ceftriaxone scheduled to run through 05/23/21.       4 Acute on chronic respiratory failure with hypoxia and hypercapnia/COPD: On NC        5 Hypokalemia: replete lytes        6 T 2 DM       7 CKD       8 Obesity: Body mass index is 53.17 kg/[name].       9 Thrombocytopenia           ____________________________________________________________      Cardiac testing   04/08/21      ECHO ADULT FOLLOW-UP OR LIMITED 04/17/2021 04/17/2021      Interpretation Summary   ?  Aortic Valve: Not well visualized. Edwards bovine bioprosthetic valve with a size of 25 mm. Mild regurgitation.  Stenosis of the aortic valve. AV mean gradient is  38 mmHg. AV area by continuity VTI is 1.0 cm2.   ?  Technical qualifiers: Echo study was technically difficult due to patient's body habitus.      Signed by: Deborah Churn, DO on 04/17/2021  5:00 PM         02/16/21      NUCLEAR CARDIAC STRESS TEST 02/20/2021 02/26/2021      Interpretation Summary   ?  Nuclear Findings: LV perfusion is normal. There is no evidence of inducible ischemia.   ?  Nuclear Findings: The defect appears to be caused by breast attenuation.   ?  ECG: Resting ECG demonstrates normal sinus rhythm.   ?  ECG: Stress ECG was negative for ischemia.   ?  Stress Test: A pharmacological stress test was performed using lexiscan. Hemodynamics are adequate for diagnosis. Blood pressure demonstrated  a normal response and heart rate demonstrated a normal response to stress. The patient's heart rate recovery was normal. The patient reported dyspnea and no chest pain during the stress test.      Ventricular bigeminy   Unifocal PVC, RBBB and superior axis PVCs      Signed by: Deborah Pounds, [name] on 02/20/2021  5:00 PM      Most recent HS troponins:   No results  for input(s): TROPHS in the last 72 hours.      No lab exists for component:  CKMB         ECG:      EKG Results                  Procedure  720  Value  Units  Date/Time           EKG, 12 LEAD, INITIAL [[id number]]  Collected: 05/11/21 2056       Order Status: Completed  Updated: 05/14/21 1706                Ventricular Rate  84  BPM           Atrial Rate  84  BPM           P-R Interval  168  ms           QRS Duration  162  ms           Q-T Interval  388  ms           QTC Calculation (Bezet)  458  ms           Calculated P Axis  68  degrees           Calculated R Axis  -102  degrees           Calculated T Axis  11  degrees                Diagnosis  --             Sinus rhythm with frequent atrial-paced complexes and premature    supraventricular complexes in a pattern of bigeminy   Right bundle branch block   Septal infarct , age undetermined   T wave  abnormality, consider lateral ischemia   Abnormal ECG      Confirmed by Barry Dienes, [name].D., John ([id number]) on 05/14/2021 5:06:24 PM              EKG, 12 LEAD, INITIAL [[id number]]  Collected: 05/11/21 2005       Order Status: Completed  Updated: 05/14/21 1706                Ventricular Rate  116  BPM           Atrial Rate  116  BPM           P-R Interval  200  ms           QRS Duration  190  ms           Q-T Interval  368  ms           QTC Calculation (Bezet)  511  ms           Calculated R Axis  -72  degrees           Calculated T Axis  105  degrees                Diagnosis  --             Atrial-sensed ventricular-paced rhythm   Abnormal ECG   Confirmed by Barry Dienes, [name].D., John ([id number]) on 05/14/2021 5:06:15 PM              EKG, 12 LEAD, INITIAL [[id number]]  Collected: 05/11/21 1531       Order Status: Completed  Updated: 05/14/21 1706  Ventricular Rate  83  BPM           Atrial Rate  83  BPM           P-R Interval  160  ms           QRS Duration  160  ms           Q-T Interval  368  ms           QTC Calculation (Bezet)  432  ms           Calculated P Axis  64  degrees           Calculated R Axis  -100  degrees           Calculated T Axis  16  degrees                Diagnosis  --             Sinus rhythm with frequent atrial-paced complexes and premature    supraventricular complexes in a pattern of bigeminy   Right bundle branch block   Possible Lateral infarct , age undetermined   Abnormal ECG   Confirmed by Barry Dienes, [name].D., John ([id number]) on 05/14/2021 5:06:02 PM              EKG 12 LEAD INITIAL [[id number]]  Collected: 05/10/21 0635       Order Status: Completed  Updated: 05/12/21 1304                Ventricular Rate  80  BPM           Atrial Rate  60  BPM           P-R Interval  212  ms           QRS Duration  112  ms           Q-T Interval  526  ms           QTC Calculation (Bezet)  606  ms           Calculated P Axis  61  degrees           Calculated R Axis  -55  degrees           Calculated T Axis  -158  degrees                 Diagnosis  --             Atrial-paced rhythm with prolonged AV conduction with frequent premature    ventricular complexes   Incomplete right bundle branch block   Left anterior fascicular block   Septal infarct , age undetermined   T wave abnormality, consider lateral ischemia   Prolonged QT   Abnormal ECG   When compared with ECG of 09-May-2021 20:16,   No significant change      Confirmed by Rathi [name], Vikas ([id number]) on 05/12/2021 1:04:16 PM              EKG, 12 LEAD, INITIAL [[id number]]         Order Status: Sent         EKG 12 LEAD INITIAL [[id number]]  Collected: 05/09/21 2016       Order Status: Completed  Updated: 05/11/21 1349                Ventricular Rate  97  BPM  Atrial Rate  49  BPM           P-R Interval  160  ms           QRS Duration  160  ms           Q-T Interval  350  ms           QTC Calculation (Bezet)  444  ms           Calculated P Axis  67  degrees           Calculated R Axis  -117  degrees           Calculated T Axis  6  degrees                Diagnosis  --             Sinus bradycardia with marked sinus arrhythmia with frequent atrial-paced    complexes and with frequent and consecutive premature ventricular complexes   Right bundle branch block   Possible Lateral infarct , age undetermined   Abnormal ECG   When compared with ECG of 13-Apr-2021 22:14,   premature ventricular complexes are now present   premature atrial complexes are no longer present   Borderline criteria for Lateral infarct are now present   Confirmed by Rathi [name], Vikas [phone]) on 05/11/2021 1:49:45 PM                           Review of Systems:      All other systems reviewed and all negative except as written in HPI       Patient unable to provide secondary  to condition        Past Medical History:        Diagnosis  Date         ?  (HFpEF) heart failure with preserved ejection fraction (HCC)       ?  Anxiety and depression       ?  Aortic valve replaced            S/p bovine aortic valve  replacement.         ?  Asthma       ?  Chronic narcotic use       ?  Chronic obstructive pulmonary disease (HCC)       ?  Chronic pain       ?  CKD (chronic kidney disease), stage III (HCC)            Baseline creatinine is 1.3-1.4 with GFR in the 40s.         ?  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  GERD (gastroesophageal reflux disease)       ?  History of vascular access device  04/13/2021          4 FR Single PICC for LTABX: R cephalic vessell length 48 CM Max P leave @ 1 CM out; Arm circumferenc 40 CM         ?  Hyperlipidemia       ?  Hypothyroidism       ?  Morbid obesity (HCC)       ?  Neuropathy       ?  Obstructive sleep apnea           ?  Rhinitis  Past Surgical History:         Procedure  Laterality  Date          ?  COLONOSCOPY  N/A  12/01/2020          COLONOSCOPY performed by Ian Malkin, [name] at [facility] ENDOSCOPY          ?  HX AORTIC VALVE REPLACEMENT              Bovine bioprosthetic          ?  HX PACEMAKER            Social Hx:  reports that she quit smoking about 11 years ago. Her smoking use included cigarettes. She has a 40.00 pack-year smoking history. She has never used smokeless tobacco. She reports that she does not currently use alcohol. She reports that she  does not use drugs.   Family Hx: family history includes Hypertension in her father and mother.     Allergies        Allergen  Reactions         ?  Nitroglycerin  Unknown (comments)             hypotension         ?  Aloe Vera  Rash     ?  Hydrochlorothiazide  Other (comments)             Reports 'kidneys dry up"          ?  Tetanus And Diphther. Tox (Pf)  Swelling             Swelling of arm and it turns black              OBJECTIVE:     Wt Readings from Last 3 Encounters:        05/16/21  154 kg (339 lb 8.1 oz)     04/24/21  (!) 160.8 kg (354 lb 8 oz)        03/09/21  149.7 kg (330 lb)           Intake/Output Summary (Last 24 hours) at 05/16/2021 0833   Last data filed at 05/16/2021 0347     Gross per 24 hour         Intake  720 ml        Output  0 ml        Net  720 ml              Physical Exam:      Vitals:      Vitals:             05/16/21 0546  05/16/21 0556  05/16/21 0736  05/16/21 0748           BP:  (!) 88/47  (!) 94/55  (!) 102/56       Pulse:  80    84       Resp:      20       Temp:      100.1 F (37.8 C)       SpO2:      95%  95%     Weight:                   Height:                Telemetry: SR, paced, PVC's/ectopy        Gen: Morbidly obese, in no distress  Neck: Supple, No JVD, No Carotid Bruit   Resp: No accessory muscle use, diminished breath sounds w/ fine crackles in bases    Card: Regular Rate,Rythm, Normal S1, S2, No murmurs, rubs or gallop.    Abd:   obese, Soft, non-tender, non-distended, BS+    MSK: No cyanosis   Skin: No rashes     Neuro: Moving all four extremities, follows commands appropriately   Psych: Fair insight, oriented to person, place, alert, Nml Affect   LE: 3+ edema      Data Review:       Radiology:    XR Results (most recent):   Results from Hospital Encounter encounter on 05/09/21      XR CHEST PORT      Narrative   PORTABLE CHEST RADIOGRAPH/S: 05/14/2021 8:01 AM      INDICATION: Worsening respiratory status.      COMPARISON: 05/09/2021, 04/15/2021, CT 04/14/2021.      TECHNIQUE: Portable frontal radiograph/s of the chest.      FINDINGS:   The lungs are hypoinflated, and there is pulmonary vascular crowding.   Interstitial and alveolar edema are probably superimposed. The central airways   are patent. No pneumothorax and no large pleural effusion. A right PICC and   pacemaker are in place. The heart is enlarged. Post CABG and aortic valve   replacement.      Impression   Cardiomegaly, pulmonary vascular congestion, probable pulmonary edema.           Recent Labs            05/16/21   0032  05/15/21   0156     NA  133*  135*     [name]  4.2  4.5     CL  94*  95*     CO2  40*  42*     BUN  31*  28*     CREA  1.29*  1.24*     GLU  134*  115*         CA  8.6  8.8          Recent Labs              05/16/21   0032  05/15/21   0156  05/14/21   0447     WBC  7.4   --    --      HGB  8.7*  8.8*  9.1*     HCT  29.4*  29.8*  31.0*          PLT  75*   --   75*        No results for input(s): PTP, INR, AP, INREXT, INREXT in the last 72 hours.      No lab exists for component: PTTP, GPT, SGOT      No results for input(s): CHOL, LDLC in the last 72 hours.      No lab exists for component: TGL, HDLC,  HBA1C         Current meds:      Current Facility-Administered Medications:    ?  metoprolol succinate (TOPROL-XL) XL tablet 25 mg, 25 mg, Oral, DAILY, Normal Recinos, Georga Hacking, NP   ?  bumetanide (BUMEX) tablet 1 mg, 1 mg, Oral, BID, Tiaria Biby, Georga Hacking, NP   ?  acetaZOLAMIDE (DIAMOX) tablet 250 mg, 250 mg, Oral, BID, Melynda Keller, [name], 250 mg at 05/15/21 1754   ?  nystatin (MYCOSTATIN) 100,000 unit/gram cream, ,  Topical, BID, Luella Cook, NP, Given at 05/16/21 0028   ?  traZODone (DESYREL) tablet 100 mg, 100 mg, Oral, QHS, Horald Chestnut, DO, 100 mg at 05/15/21 2156   ?  oxyCODONE IR (ROXICODONE) tablet 5 mg, 5 mg, Oral, Q6H PRN, Horald Chestnut, DO, 5 mg at 05/15/21 2156   ?  albuterol-ipratropium (DUO-NEB) 2.5 MG-0.5 MG/3 ML, 3 mL, Nebulization, Q4H PRN, Jamil, Arna Medici, DO, 3 mL at 05/15/21 2003   ?  heparin (porcine) injection 5,000 Units, 5,000 Units, SubCUTAneous, Q8H, Horald Chestnut, DO, 5,000 Units at 05/16/21 1610   ?  insulin lispro (HUMALOG) injection, , SubCUTAneous, AC&HS, Horald Chestnut, DO, 3 Units at 05/14/21 1254   ?  glucose chewable tablet 16 g, 4 Tablet, Oral, PRN, Horald Chestnut, DO   ?  glucagon (GLUCAGEN) injection 1 mg, 1 mg, IntraMUSCular, PRN, Jamil, Arna Medici, DO   ?  dextrose 10% infusion 0-250 mL, 0-250 mL, IntraVENous, PRN, Jamil, Arna Medici, DO   ?  arformoteroL (BROVANA) neb solution 15 mcg, 15 mcg, Nebulization, BID RT, Jamil, Arna Medici, DO, 15 mcg at 05/16/21 0746   ?  budesonide (PULMICORT) 500 mcg/2 ml nebulizer suspension, 500 mcg, Nebulization, BID RT, Horald Chestnut, DO, 500 mcg at 05/16/21 0746   ?  sodium chloride (NS)  flush 5-40 mL, 5-40 mL, IntraVENous, Q8H, Melynda Keller, [name], 10 mL at 05/16/21 0546   ?  sodium chloride (NS) flush 5-40 mL, 5-40 mL, IntraVENous, PRN, Melynda Keller, [name]   ?  acetaminophen (TYLENOL) tablet 650 mg, 650 mg, Oral, Q6H PRN, 650 mg at 05/12/21 0826 **OR** acetaminophen (TYLENOL) suppository 650 mg, 650 mg, Rectal, Q6H PRN, Melynda Keller, [name]   ?  polyethylene glycol (MIRALAX) packet 17 g, 17 g, Oral, DAILY PRN, Melynda Keller, [name]   ?  senna (SENOKOT) tablet 8.6 mg, 1 Tablet, Oral, DAILY PRN, Melynda Keller, [name]   ?  promethazine (PHENERGAN) tablet 12.5 mg, 12.5 mg, Oral, Q6H PRN **OR** ondansetron (ZOFRAN) injection 4 mg, 4 mg, IntraVENous, Q6H PRN, Melynda Keller, [name], 4 mg at 05/15/21 1709   ?  cefTRIAXone (ROCEPHIN) 2 g in 0.9% sodium chloride 20 mL IV syringe, 2 g, IntraVENous, Q24H, Melynda Keller, [name], 2 g at 05/15/21 9604   ?  aspirin chewable tablet 81 mg, 81 mg, Oral, DAILY, Melynda Keller, [name], 81 mg at 05/15/21 5409   ?  atorvastatin (LIPITOR) tablet 10 mg, 10 mg, Oral, DAILY, Melynda Keller, [name], 10 mg at 05/15/21 8119   ?  citalopram (CELEXA) tablet 40 mg, 40 mg, Oral, DAILY, Melynda Keller, [name], 40 mg at 05/15/21 1478   ?  glipiZIDE (GLUCOTROL) tablet 5 mg, 5 mg, Oral, BID WITH MEALS, Melynda Keller, [name], 5 mg at 05/15/21 1713   ?  levothyroxine (SYNTHROID) tablet 150 mcg, 150 mcg, Oral, ACB, Melynda Keller, [name], 150 mcg at 05/16/21 0546   ?  montelukast (SINGULAIR) tablet 10 mg, 10 mg, Oral, QHS, Melynda Keller, [name], 10 mg at 05/15/21 2156   ?  sucralfate (CARAFATE) tablet 1 g, 1 g, Oral, TID, Melynda Keller, [name], 1 g at 05/15/21 2156      Deborah Kidney, NP      [facility]   Call center: (P) [phone]  (F) [phone]         CC:Barrett, Lance Sell, NP

## 2021-05-16 NOTE — Progress Notes (Signed)
Problem: Mobility Impaired (Adult and Pediatric)  Goal: *Acute Goals and Plan of Care (Insert Text)  Description: FUNCTIONAL STATUS PRIOR TO ADMISSION: Patient was modified independent using a single point cane for household and community amb. Recent hospital admission and d/c to SNF [facility] x last 2+ weeks then readmit    HOME SUPPORT PRIOR TO ADMISSION: The patient lived with son's family but did not require assist.    Physical Therapy Goals  Initiated 05/12/2021  1.  Patient will move from supine to sit and sit to supine , scoot up and down, and roll side to side in bed with modified independence within 7 day(s).    2.  Patient will transfer from bed to chair and chair to bed with modified independence using the least restrictive device within 7 day(s).  3.  Patient will perform sit to stand with modified independence within 7 day(s).  4.  Patient will ambulate with modified independence for 50 feet with the least restrictive device within 7 day(s).   5.  Patient will ascend/descend 5 stairs with 1 handrail(s) and SPC with modified independence within 7 day(s).   Outcome: Progressing Towards Goal  Note:   PHYSICAL THERAPY TREATMENT  Patient: Deborah [name] (66 y.o. female)  Date: 05/16/2021  Diagnosis: Acute on chronic heart failure with preserved ejection fraction (HFpEF) (HCC) [I50.33] Acute on chronic heart failure with preserved ejection fraction (HFpEF) (HCC)      Precautions: Fall  Chart, physical therapy assessment, plan of care and goals were reviewed.    ASSESSMENT  Patient continues with skilled PT services and progressing towards goals. Received using 6L O2 sat 97% agreeable to limited gait training within room with one seated rest break. Recommend use of RW vs cane for improved balance and safety with OOB mobility.    Current Level of Function Impacting Discharge (mobility/balance): CGA    Other factors to consider for discharge:          PLAN :  Patient continues to benefit from skilled intervention to  address the above impairments.  Continue treatment per established plan of care.  to address goals.    Recommendation for discharge: (in order for the patient to meet his/her long term goals)  Physical therapy at least 2 days/week in the home     This discharge recommendation:  Has been made in collaboration with the attending provider and/or case management    IF patient discharges home will need the following DME: rolling walker       SUBJECTIVE:   Patient stated "that is enough for now."    OBJECTIVE DATA SUMMARY:   Critical Behavior:  Neurologic State: Alert  Orientation Level: Oriented X4  Cognition: Follows commands  Safety/Judgement: Awareness of environment  Functional Mobility Training:  Bed Mobility:                    Transfers:  Sit to Stand: Stand-by assistance  Stand to Sit: Stand-by assistance                             Balance:  Sitting: Intact  Standing: With support  Standing - Static: Constant support;Good  Standing - Dynamic : Fair  Ambulation/Gait Training:  Distance (ft):  (10 x 2)  Assistive Device: Walker, rolling;Gait belt  Ambulation - Level of Assistance: Stand-by assistance        Gait Abnormalities: Antalgic;Trunk sway increased        Base  of Support: Widened     Speed/Cadence: Pace decreased (<100 feet/min)                       Stairs:              Therapeutic Exercises:     Pain Rating:      Activity Tolerance:   Good and requires rest breaks    After treatment patient left in no apparent distress:   Supine in bed and Call bell within reach    COMMUNICATION/COLLABORATION:   The patient's plan of care was discussed with: Registered nurse.     [employer]   Time Calculation: 18 mins

## 2021-05-17 ENCOUNTER — Inpatient Hospital Stay: Admit: 2021-05-17 | Payer: MEDICARE | Primary: Family

## 2021-05-17 LAB — POC G3 - PUL
Allens test (POC): POSITIVE
Base Excess, Arterial: 11.1 mmol/L
Base excess (POC): 11.1 mmol/L
HCO3 (POC): 38.2 MMOL/L — ABNORMAL HIGH (ref 22–26)
HCO3, Art: 38.2 MMOL/L — ABNORMAL HIGH (ref 22–26)
POC Allen's Test: POSITIVE
POC O2 SAT: 92.4 % (ref 92–97)
pCO2 (POC): 65.8 MMHG — ABNORMAL HIGH (ref 35.0–45.0)
pCO2, Art: 65.8 MMHG — ABNORMAL HIGH (ref 35.0–45.0)
pH (POC): 7.37 (ref 7.35–7.45)
pH, Art: 7.37 (ref 7.35–7.45)
pO2 (POC): 70 MMHG — ABNORMAL LOW (ref 80–100)
pO2, Art: 70 MMHG — ABNORMAL LOW (ref 80–100)
sO2 (POC): 92.4 % (ref 92–97)

## 2021-05-17 LAB — CBC WITH AUTOMATED DIFF
ABS. BASOPHILS: 0.1 10*3/uL (ref 0.0–0.1)
ABS. EOSINOPHILS: 0.1 10*3/uL (ref 0.0–0.4)
ABS. IMM. GRANS.: 0.1 10*3/uL — ABNORMAL HIGH (ref 0.00–0.04)
ABS. LYMPHOCYTES: 1.3 10*3/uL (ref 0.8–3.5)
ABS. MONOCYTES: 0.5 10*3/uL (ref 0.0–1.0)
ABS. NEUTROPHILS: 3.7 10*3/uL (ref 1.8–8.0)
ABSOLUTE NRBC: 0 10*3/uL (ref 0.00–0.01)
BASOPHILS: 1 % (ref 0–1)
EOSINOPHILS: 2 % (ref 0–7)
HCT: 27.1 % — ABNORMAL LOW (ref 35.0–47.0)
HGB: 8 g/dL — ABNORMAL LOW (ref 11.5–16.0)
IMMATURE GRANULOCYTES: 1 % — ABNORMAL HIGH (ref 0.0–0.5)
LYMPHOCYTES: 22 % (ref 12–49)
MCH: 28.4 PG (ref 26.0–34.0)
MCHC: 29.5 g/dL — ABNORMAL LOW (ref 30.0–36.5)
MCV: 96.1 FL (ref 80.0–99.0)
MONOCYTES: 9 % (ref 5–13)
MPV: 11.1 FL (ref 8.9–12.9)
NEUTROPHILS: 65 % (ref 32–75)
NRBC: 0 PER 100 WBC
PLATELET: 69 10*3/uL — ABNORMAL LOW (ref 150–400)
RBC: 2.82 M/uL — ABNORMAL LOW (ref 3.80–5.20)
RDW: 15.9 % — ABNORMAL HIGH (ref 11.5–14.5)
WBC: 5.8 10*3/uL (ref 3.6–11.0)

## 2021-05-17 LAB — URINALYSIS W/MICROSCOPIC
Bilirubin: NEGATIVE
Glucose: NEGATIVE mg/dL
Ketone: NEGATIVE mg/dL
Nitrites: NEGATIVE
Specific gravity: 1.016 (ref 1.003–1.030)
Urobilinogen: 1 EU/dL (ref 0.2–1.0)
pH (UA): 7.5 (ref 5.0–8.0)

## 2021-05-17 LAB — LACTIC ACID
Lactic Acid: 1.7 MMOL/L (ref 0.4–2.0)
Lactic acid: 1.7 MMOL/L (ref 0.4–2.0)

## 2021-05-17 LAB — METABOLIC PANEL, BASIC
Anion gap: NEGATIVE mmol/L (ref 5–15)
BUN/Creatinine ratio: 21 — ABNORMAL HIGH (ref 12–20)
BUN: 28 MG/DL — ABNORMAL HIGH (ref 6–20)
CO2: 40 mmol/L — ABNORMAL HIGH (ref 21–32)
Calcium: 9 MG/DL (ref 8.5–10.1)
Chloride: 94 mmol/L — ABNORMAL LOW (ref 97–108)
Creatinine: 1.32 MG/DL — ABNORMAL HIGH (ref 0.55–1.02)
Glucose: 126 mg/dL — ABNORMAL HIGH (ref 65–100)
Potassium: 4.1 mmol/L (ref 3.5–5.1)
Sodium: 133 mmol/L — ABNORMAL LOW (ref 136–145)
eGFR: 45 mL/min/{1.73_m2} — ABNORMAL LOW (ref >60)

## 2021-05-17 LAB — BLOOD GAS, ARTERIAL
BASE EXCESS: 11.1 mmol/L
BICARBONATE: 40 mmol/L — ABNORMAL HIGH (ref 22–26)
FIO2: 35 %
O2 SAT: 98 % — ABNORMAL HIGH (ref 92–97)
PCO2: 70 mmHg — ABNORMAL HIGH (ref 35–45)
PEEP/CPAP: 6
PO2: 109 mmHg — ABNORMAL HIGH (ref 80–100)
SET RATE: 12
pH: 7.37 (ref 7.35–7.45)

## 2021-05-17 LAB — GLUCOSE, POC
Glucose (POC): 134 mg/dL — ABNORMAL HIGH (ref 65–117)
Glucose (POC): 127 mg/dL — ABNORMAL HIGH (ref 65–117)
Glucose (POC): 116 mg/dL (ref 65–117)
Glucose (POC): 122 mg/dL — ABNORMAL HIGH (ref 65–117)
Glucose (POC): 86 mg/dL (ref 65–117)

## 2021-05-17 LAB — HEPATIC FUNCTION PANEL
A-G Ratio: 0.8 — ABNORMAL LOW (ref 1.1–2.2)
ALT (SGPT): 17 U/L (ref 12–78)
ALT: 17 U/L (ref 12–78)
AST (SGOT): 16 U/L (ref 15–37)
AST: 16 U/L (ref 15–37)
Albumin/Globulin Ratio: 0.8 — ABNORMAL LOW (ref 1.1–2.2)
Albumin: 3 g/dL — ABNORMAL LOW (ref 3.5–5.0)
Albumin: 3 g/dL — ABNORMAL LOW (ref 3.5–5.0)
Alk. phosphatase: 45 U/L (ref 45–117)
Alkaline Phosphatase: 45 U/L (ref 45–117)
Bilirubin, Direct: 0.4 MG/DL — ABNORMAL HIGH (ref 0.0–0.2)
Bilirubin, direct: 0.4 MG/DL — ABNORMAL HIGH (ref 0.0–0.2)
Bilirubin, total: 1.4 MG/DL — ABNORMAL HIGH (ref 0.2–1.0)
Globulin: 4 g/dL (ref 2.0–4.0)
Globulin: 4 g/dL (ref 2.0–4.0)
Protein, total: 7 g/dL (ref 6.4–8.2)
Total Bilirubin: 1.4 MG/DL — ABNORMAL HIGH (ref 0.2–1.0)
Total Protein: 7 g/dL (ref 6.4–8.2)

## 2021-05-17 LAB — PROCALCITONIN
Procalcitonin: <0.05 ng/mL
Procalcitonin: <0.05 ng/mL

## 2021-05-17 LAB — URINE CULTURE HOLD SAMPLE

## 2021-05-17 LAB — TROPONIN-HIGH SENSITIVITY
Troponin-High Sensitivity: 57 ng/L — ABNORMAL HIGH (ref 0–51)
Troponin-High Sensitivity: 47 ng/L (ref 0–51)

## 2021-05-17 LAB — SAMPLES BEING HELD

## 2021-05-17 LAB — URINALYSIS WITH MICROSCOPIC
Bilirubin, Urine: NEGATIVE
Glucose, Ur: NEGATIVE mg/dL
Ketones, Urine: NEGATIVE mg/dL
Nitrite, Urine: NEGATIVE
Specific Gravity, UA: 1.016 (ref 1.003–1.030)
Urobilinogen, UA, POCT: 1 EU/dL (ref 0.2–1.0)
pH, UA: 7.5 (ref 5.0–8.0)

## 2021-05-17 LAB — CBC WITH AUTO DIFFERENTIAL
Basophils %: 1 % (ref 0–1)
Basophils Absolute: 0.1 10*3/uL (ref 0.0–0.1)
Eosinophils %: 2 % (ref 0–7)
Eosinophils Absolute: 0.1 10*3/uL (ref 0.0–0.4)
Granulocyte Absolute Count: 0.1 10*3/uL — ABNORMAL HIGH (ref 0.00–0.04)
Hematocrit: 27.1 % — ABNORMAL LOW (ref 35.0–47.0)
Hemoglobin: 8 g/dL — ABNORMAL LOW (ref 11.5–16.0)
Immature Granulocytes: 1 % — ABNORMAL HIGH (ref 0.0–0.5)
Lymphocytes %: 22 % (ref 12–49)
Lymphocytes Absolute: 1.3 10*3/uL (ref 0.8–3.5)
MCH: 28.4 PG (ref 26.0–34.0)
MCHC: 29.5 g/dL — ABNORMAL LOW (ref 30.0–36.5)
MCV: 96.1 FL (ref 80.0–99.0)
MPV: 11.1 FL (ref 8.9–12.9)
Monocytes %: 9 % (ref 5–13)
Monocytes Absolute: 0.5 10*3/uL (ref 0.0–1.0)
NRBC Absolute: 0 10*3/uL (ref 0.00–0.01)
Neutrophils %: 65 % (ref 32–75)
Neutrophils Absolute: 3.7 10*3/uL (ref 1.8–8.0)
Nucleated RBCs: 0 PER 100 WBC
Platelets: 69 10*3/uL — ABNORMAL LOW (ref 150–400)
RBC: 2.82 M/uL — ABNORMAL LOW (ref 3.80–5.20)
RDW: 15.9 % — ABNORMAL HIGH (ref 11.5–14.5)
WBC: 5.8 10*3/uL (ref 3.6–11.0)

## 2021-05-17 LAB — ARTERIAL BLOOD GASES
Base Excess: 11.1 mmol/L
FIO2: 35 %
HCO3: 40 mmol/L — ABNORMAL HIGH (ref 22–26)
O2 Sat: 98 % — ABNORMAL HIGH (ref 92–97)
PCO2: 70 mmHg — ABNORMAL HIGH (ref 35–45)
PO2: 109 mmHg — ABNORMAL HIGH (ref 80–100)
Peep/Cpap: 6
Set Rate: 12
pH: 7.37 (ref 7.35–7.45)

## 2021-05-17 LAB — POCT GLUCOSE
POC Glucose: 134 mg/dL — ABNORMAL HIGH (ref 65–117)
POC Glucose: 127 mg/dL — ABNORMAL HIGH (ref 65–117)
POC Glucose: 116 mg/dL (ref 65–117)
POC Glucose: 122 mg/dL — ABNORMAL HIGH (ref 65–117)
POC Glucose: 86 mg/dL (ref 65–117)

## 2021-05-17 LAB — BASIC METABOLIC PANEL
Anion Gap: NEGATIVE mmol/L (ref 5–15)
BUN: 28 MG/DL — ABNORMAL HIGH (ref 6–20)
Bun/Cre Ratio: 21 — ABNORMAL HIGH (ref 12–20)
CO2: 40 mmol/L — ABNORMAL HIGH (ref 21–32)
Calcium: 9 MG/DL (ref 8.5–10.1)
Chloride: 94 mmol/L — ABNORMAL LOW (ref 97–108)
Creatinine: 1.32 MG/DL — ABNORMAL HIGH (ref 0.55–1.02)
Est, Glom Filt Rate: 45 mL/min/{1.73_m2} — ABNORMAL LOW (ref >60)
Glucose: 126 mg/dL — ABNORMAL HIGH (ref 65–100)
Potassium: 4.1 mmol/L (ref 3.5–5.1)
Sodium: 133 mmol/L — ABNORMAL LOW (ref 136–145)

## 2021-05-17 LAB — TROPONIN, HIGH SENSITIVITY
Troponin, High Sensitivity: 57 ng/L — ABNORMAL HIGH (ref 0–51)
Troponin, High Sensitivity: 47 ng/L (ref 0–51)

## 2021-05-17 MED ORDER — VASOPRESSIN 20 UNIT/ML INTRAVENOUS SOLUTION
20 unit/mL | INTRAVENOUS | Status: DC
Start: 2021-05-17 — End: 2021-05-18

## 2021-05-17 MED ORDER — IPRATROPIUM-ALBUTEROL 2.5 MG-0.5 MG/3 ML NEB SOLUTION
2.5 mg-0.5 mg/3 ml | RESPIRATORY_TRACT | Status: DC
Start: 2021-05-17 — End: 2021-05-19
  Administered 2021-05-17 – 2021-05-19 (×9): via RESPIRATORY_TRACT

## 2021-05-17 MED ORDER — ALBUMIN, HUMAN 25 % IV
25 % | Freq: Once | INTRAVENOUS | Status: AC
Start: 2021-05-17 — End: 2021-05-17
  Administered 2021-05-17: 15:00:00 via INTRAVENOUS

## 2021-05-17 MED ORDER — NOREPINEPHRINE BITARTRATE 16 MG/250 ML (64 MCG/ML) NS INFUSION
16 mg/250 mL (64 mcg/mL) | INTRAVENOUS | Status: DC
Start: 2021-05-17 — End: 2021-05-19
  Administered 2021-05-18 (×7): via INTRAVENOUS

## 2021-05-17 MED ORDER — METHYLPREDNISOLONE (PF) 40 MG/ML IJ SOLR
40 mg/mL | Freq: Two times a day (BID) | INTRAMUSCULAR | Status: DC
Start: 2021-05-17 — End: 2021-05-19
  Administered 2021-05-18 – 2021-05-19 (×4): via INTRAVENOUS

## 2021-05-17 MED ORDER — BUMETANIDE 1 MG TAB
1 mg | Freq: Two times a day (BID) | ORAL | Status: DC
Start: 2021-05-17 — End: 2021-05-19
  Administered 2021-05-17 – 2021-05-18 (×2): via ORAL

## 2021-05-17 MED FILL — HEPARIN (PORCINE) 5,000 UNIT/ML IJ SOLN: 5000 unit/mL | INTRAMUSCULAR | Qty: 1

## 2021-05-17 MED FILL — BUMETANIDE 1 MG TAB: 1 mg | ORAL | Qty: 1

## 2021-05-17 MED FILL — VASOPRESSIN 20 UNIT/ML INTRAVENOUS SOLUTION: 20 unit/mL | INTRAVENOUS | Qty: 1

## 2021-05-17 MED FILL — ATORVASTATIN 10 MG TAB: 10 mg | ORAL | Qty: 1

## 2021-05-17 MED FILL — IPRATROPIUM-ALBUTEROL 2.5 MG-0.5 MG/3 ML NEB SOLUTION: 2.5 mg-0.5 mg/3 ml | RESPIRATORY_TRACT | Qty: 3

## 2021-05-17 MED FILL — ALBUTEIN 25 % INTRAVENOUS SOLUTION: 25 % | INTRAVENOUS | Qty: 100

## 2021-05-17 MED FILL — ARFORMOTEROL 15 MCG/2 ML NEB SOLUTION: 15 mcg/2 mL | RESPIRATORY_TRACT | Qty: 2

## 2021-05-17 MED FILL — ACETAZOLAMIDE 250 MG TAB: 250 mg | ORAL | Qty: 1

## 2021-05-17 MED FILL — BUDESONIDE 0.5 MG/2 ML NEB SUSPENSION: 0.5 mg/2 mL | RESPIRATORY_TRACT | Qty: 1

## 2021-05-17 MED FILL — TRAZODONE 100 MG TAB: 100 mg | ORAL | Qty: 1

## 2021-05-17 MED FILL — ACETAMINOPHEN 325 MG TABLET: 325 mg | ORAL | Qty: 2

## 2021-05-17 MED FILL — ONDANSETRON (PF) 4 MG/2 ML INJECTION: 4 mg/2 mL | INTRAMUSCULAR | Qty: 2

## 2021-05-17 MED FILL — METOPROLOL SUCCINATE SR 25 MG 24 HR TAB: 25 mg | ORAL | Qty: 1

## 2021-05-17 MED FILL — LEVOTHYROXINE 150 MCG TAB: 150 mcg | ORAL | Qty: 1

## 2021-05-17 MED FILL — ASPIRIN 81 MG CHEWABLE TAB: 81 mg | ORAL | Qty: 1

## 2021-05-17 MED FILL — MONTELUKAST 10 MG TAB: 10 mg | ORAL | Qty: 1

## 2021-05-17 MED FILL — CEFTRIAXONE 2 GRAM SOLUTION FOR INJECTION: 2 gram | INTRAMUSCULAR | Qty: 2

## 2021-05-17 MED FILL — GLIPIZIDE 5 MG TAB: 5 mg | ORAL | Qty: 1

## 2021-05-17 MED FILL — CITALOPRAM 20 MG TAB: 20 mg | ORAL | Qty: 2

## 2021-05-17 MED FILL — OXYCODONE 5 MG TAB: 5 mg | ORAL | Qty: 1

## 2021-05-17 MED FILL — CARAFATE 1 GRAM TABLET: 1 gram | ORAL | Qty: 1

## 2021-05-17 MED FILL — NOREPINEPHRINE BITARTRATE 16 MG/250 ML (64 MCG/ML) NS INFUSION: 16 mg/250 mL (64 mcg/mL) | INTRAVENOUS | Qty: 250

## 2021-05-17 NOTE — Progress Notes (Signed)
Progress Notes by Margrett Rud, PT, DPT at 05/17/21 1342                Author: Margrett Rud, PT, DPT  Service: Physical Therapy  Author Type: Physical Therapist       Filed: 05/17/21 1344  Date of Service: 05/17/21 1342  Status: Signed          Editor: Margrett Rud, PT, DPT (Physical Therapist)               Rehab Therapy Note:      PT and OT treatments deferred on multiple attempts. Pt with rapid response earlier today due to AMS and hypotension. On follow-up per chart pt with continued hypotension and tachycardia. Order on chart for transfer to higher level of care. Pt is not medically  stable for rehab interventions at this time.      Junie Spencer, PT, DPT, Ernestene Mention

## 2021-05-17 NOTE — Progress Notes (Signed)
Patient now on continuous BiPAP and stepdown level of care. RRT RN at bedside.     1345: Pt resting comfortably on BiPAP. VS stable. No acute distress      1445: Pt sleeping. MD informed of continued soft BPs.     1525: Pt repositioned. BPs continually soft. Pt drowsy. MD discussing orders. Primary RN calling report to higher level of care.     1600: Pt with BPs 80s/50s. Pt increasingly lethargic. Responds to stimuli.         Pt transferred to ICU 2 by RRT RN, RT and primary RN

## 2021-05-17 NOTE — Progress Notes (Signed)
1630 TRANSFER - IN REPORT:    Verbal report received from Sheralyn Boatman, RN (name) on Deborah [name]  being received from MedSurg (unit) for routine progression of care      Report consisted of patient's Situation, Background, Assessment and   Recommendations(SBAR).     Information from the following report(s) SBAR, ED Summary, Intake/Output, MAR, Recent Results, Med Rec Status, and Cardiac Rhythm Atrial paced  was reviewed with the receiving nurse.    Opportunity for questions and clarification was provided.      Assessment completed upon patient's arrival to unit and care assumed.     Primary Nurse Daneen Schick, RN and Duwayne Heck, RN performed a dual skin assessment on this patient No impairment noted  Braden score is see flowsheet.     1900 Bedside and Verbal shift change report given to Earney Mallet, Charity fundraiser (Cabin crew) by Cathlean Marseilles, RN (offgoing nurse). Report included the following information SBAR, ED Summary, Intake/Output, MAR, Recent Results, Med Rec Status, and Cardiac Rhythm atrial paced .

## 2021-05-17 NOTE — Progress Notes (Signed)
Progress  Notes by Truitt Leep, RN at 05/17/21 1907                Author: Truitt Leep, RN  Service: --  Author Type: Registered Nurse       Filed: 05/18/21 0652  Date of Service: 05/17/21 1907  Status: Addendum          Editor: Truitt Leep, RN (Registered Nurse)          Related Notes: Original Note by Truitt Leep, RN (Registered Nurse) filed at 05/18/21 [phone]               1900: Bedside and Verbal shift change report given to Demetrios Loll, RN (oncoming nurse) by Arnoldo Morale, RN (offgoing nurse). Report included the following information SBAR, Kardex, Intake/Output,  MAR, Recent Results, Cardiac Rhythm A.paced, Alarm Parameters , and Quality Measures.       Primary Nurse Truitt Leep, RN and Arnoldo Morale, RN performed a dual skin assessment on this patient  No impairment noted   Braden score: see flowsheet      1930: Shift assessment completed; see flowsheet      2024: Victorino Dike, NP notified of 20 runs of v-tach. Discussed with NP that last potassium was 4.2 and magnesium 2.1. Orders to recollect BMP, CBC, and mag.      2102Victorino Dike, NP notified of patient being continuously apneic on BIPAP. Respiratory called at bedside to assess patient.       0136:  Cards on-call notified of sustained v-tach.       0413: On-call Cards returned call. MD stated that it was going to happen as she is in the hospital and is sick. Expressed that it will continue to happen during hospital stay and there is no need for further workup.      0650: Son updated on patient condition; answered questions satisfaction.      0700: Bedside and Verbal shift change report given to Judeth Cornfield, Charity fundraiser (Cabin crew) by Demetrios Loll, RN (offgoing nurse). Report included the following information SBAR, Kardex, Intake/Output, MAR, Recent Results, Cardiac Rhythm A. Paced, Alarm Parameters , and  Quality Measures.

## 2021-05-17 NOTE — Progress Notes (Signed)
Progress Notes by Vincent Peyer, NP at 05/17/21 [phone]                Author: Vincent Peyer, NP  Service: Cardiology  Author Type: Nurse Practitioner       Filed: 05/17/21 0844  Date of Service: 05/17/21 0727  Status: Attested           Editor: Vincent Peyer, NP (Nurse Practitioner)  Cosigner: Lilli Few, MD at 05/17/21 1351          Attestation signed by Lilli Few, MD at 05/17/21 1351          Pt personally seen and examined. Chart reviewed.       Agree with advanced NP's history, exam and  A/P with changes/additons.       Fever+      Blood pressure (!) 105/59, pulse 83, temperature 97.8 F (36.6 C), resp. rate 20,  height  (1.702 m), weight 339 lb 8.1 oz (154 kg), SpO2 96 %.                  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 MD, [facility]                                                                                                     [facility] CARDIOLOGY                     Cardiology Care Note       Initial Encounter      Follow-up        Patient Name: Deborah [name] - DOB:29-Sep-1955 - RUE:[id number]   Primary Cardiologist: Sondra Barges Cardiology Physicians: Thurston Pounds MD   Consulting Cardiologist: Sondra Barges Cardiology Physicians: Drue Second MD        Reason  for encounter: CHF       HPI:         Deborah [name] is a 66 y.o. female with PMH of HF p EF, a fib, possible atrial lead vegetation treated abx, repeat ECHO with no  evidence of vegetation with COPD, A/C resp failure who was recently discharged from hospital on  04/24/2021 to [facility] for rehab.       She says that she had not been receiving her Bumex on a consistent basis and missed several days dosing.       She started having a progressively worsening SOB associated with lower extremity swelling. She says she was using oxygen but required supplementation in the ED. She denies any cough or fever. No chills, nausea or vomiting.      Subjective:        Britten Parady is up in chair, cont to feel improvement.          Assessment and Plan          1 A/C HF p EF: seems as if her diuretics were not being administered at SNF. pBNP is improved and chest xray unchanged (cont pulm edema).  Cont bumex BID,  diamox.       2 Hx a fib s/p PPM (not MRI compatible)/ s/p AVR: ectopy noted on tele.  Has had bigemny and intermittent episodes of SVT with Pacer tracking at 150 bpm, episodes have decreased. Pacer interrogated,  functioning normally w/ increased PVC's. Cont metoprolol       3 Bacteremia due to group B Streptococcus: during recent admission, she had GBS bacteremia complicated by pacemaker line vegetation. Continue IV Ceftriaxone scheduled to run through 05/23/21.       4 Acute on chronic respiratory failure with hypoxia and hypercapnia/COPD: On NC        5 Hypokalemia: replete lytes        6 T 2 DM       7 CKD       8 Obesity: Body mass index is 53.17 kg/m.       9 Thrombocytopenia       Follow-up Information              Follow up With  Specialties  Details  Why  Contact Info        Thurston Pounds, MD  Cardiovascular Disease Physician, Clinical Cardiac Electrophysiology Physician  Follow up on 05/25/2021  1:20 pm  [address] [place] [address]   [phone]           [facility]  Home Health Services  Follow up  Home health services  [address]   [address]   [place] [address]   [phone]                                ____________________________________________________________      Cardiac testing   04/08/21      ECHO ADULT FOLLOW-UP OR LIMITED 04/17/2021 04/17/2021      Interpretation Summary   ?  Aortic Valve: Not well visualized. Edwards bovine bioprosthetic valve with a size of 25 mm. Mild regurgitation.  Stenosis of the aortic valve. AV mean gradient is 38 mmHg. AV area by continuity VTI is 1.0 cm2.   ?  Technical qualifiers: Echo study was technically difficult due to patient's body habitus.      Signed by: Candyce Churn, DO on 04/17/2021  5:00 PM         02/16/21      NUCLEAR CARDIAC STRESS TEST 02/20/2021 02/26/2021      Interpretation Summary   ?  Nuclear Findings:  LV perfusion is normal. There is no evidence of inducible ischemia.   ?  Nuclear Findings: The defect appears to be caused by breast attenuation.   ?  ECG: Resting ECG demonstrates normal sinus rhythm.   ?  ECG: Stress ECG was negative for ischemia.   ?  Stress Test: A pharmacological stress test was performed using lexiscan. Hemodynamics are adequate for diagnosis. Blood pressure demonstrated  a normal response and heart rate demonstrated a normal response to stress. The patient's heart rate recovery was normal. The patient reported dyspnea and no chest pain during the stress test.      Ventricular bigeminy   Unifocal PVC, RBBB and superior axis PVCs      Signed by: Thurston Pounds, MD on 02/20/2021  5:00 PM      Most recent HS troponins:   No results for input(s): TROPHS in the last 72 hours.      No lab exists for component:  CKMB         ECG:      EKG Results                  Procedure  720  Value  Units  Date/Time           EKG, 12 LEAD, INITIAL [[id number]]  Collected: 05/11/21 2056       Order Status: Completed  Updated: 05/14/21 1706                Ventricular Rate  84  BPM            Atrial Rate  84  BPM           P-R Interval  168  ms           QRS Duration  162  ms           Q-T Interval  388  ms           QTC Calculation (Bezet)  458  ms           Calculated P Axis  68  degrees           Calculated R Axis  -102  degrees           Calculated T Axis  11  degrees                Diagnosis  --             Sinus rhythm with frequent atrial-paced complexes and premature    supraventricular complexes in a pattern of bigeminy   Right bundle branch block   Septal infarct , age undetermined   T wave abnormality, consider lateral ischemia   Abnormal ECG      Confirmed by Barry Dienes, M.D., John ([id number]) on 05/14/2021 5:06:24 PM              EKG, 12 LEAD, INITIAL [[id number]]  Collected: 05/11/21 2005       Order Status: Completed  Updated: 05/14/21 1706                Ventricular Rate  116  BPM           Atrial Rate  116  BPM           P-R Interval  200  ms           QRS Duration  190  ms           Q-T Interval  368  ms           QTC Calculation (Bezet)  511  ms           Calculated R Axis  -72  degrees           Calculated T Axis  105  degrees                Diagnosis  --             Atrial-sensed ventricular-paced rhythm   Abnormal ECG   Confirmed by Barry Dienes, M.D., John ([id number]) on 05/14/2021 5:06:15 PM              EKG, 12 LEAD, INITIAL [[id number]]  Collected: 05/11/21 1531       Order Status: Completed  Updated: 05/14/21 1706                Ventricular Rate  83  BPM           Atrial Rate  83  BPM           P-R Interval  160  ms           QRS Duration  160  ms           Q-T Interval  368  ms           QTC Calculation (Bezet)  432  ms           Calculated P Axis  64  degrees           Calculated R Axis  -100  degrees           Calculated T Axis  16  degrees                Diagnosis  --             Sinus rhythm with frequent atrial-paced complexes and premature    supraventricular complexes in a pattern of bigeminy   Right bundle branch block   Possible Lateral infarct , age undetermined   Abnormal ECG    Confirmed by Barry Dienes, M.D., John ([id number]) on 05/14/2021 5:06:02 PM              EKG 12 LEAD INITIAL [[id number]]  Collected: 05/10/21 0635       Order Status: Completed  Updated: 05/12/21 1304                Ventricular Rate  80  BPM           Atrial Rate  60  BPM           P-R Interval  212  ms           QRS Duration  112  ms           Q-T Interval  526  ms           QTC Calculation (Bezet)  606  ms           Calculated P Axis  61  degrees           Calculated R Axis  -55  degrees           Calculated T Axis  -158  degrees                Diagnosis  --             Atrial-paced rhythm with prolonged AV conduction with frequent premature    ventricular complexes  Incomplete right bundle branch block   Left anterior fascicular block   Septal infarct , age undetermined   T wave abnormality, consider lateral ischemia   Prolonged QT   Abnormal ECG   When compared with ECG of 09-May-2021 20:16,   No significant change      Confirmed by Rathi MD, Vikas [phone]) on 05/12/2021 1:04:16 PM              EKG, 12 LEAD, INITIAL [[id number]]         Order Status: Sent         EKG 12 LEAD INITIAL [[id number]]  Collected: 05/09/21 2016       Order Status: Completed  Updated: 05/11/21 1349                Ventricular Rate  97  BPM           Atrial Rate  49  BPM           P-R Interval  160  ms           QRS Duration  160  ms           Q-T Interval  350  ms           QTC Calculation (Bezet)  444  ms           Calculated P Axis  67  degrees           Calculated R Axis  -117  degrees           Calculated T Axis  6  degrees                Diagnosis  --             Sinus bradycardia with marked sinus arrhythmia with frequent atrial-paced    complexes and with frequent and consecutive premature ventricular complexes   Right bundle branch block   Possible Lateral infarct , age undetermined   Abnormal ECG   When compared with ECG of 13-Apr-2021 22:14,   premature ventricular complexes are now present   premature atrial complexes are no longer present    Borderline criteria for Lateral infarct are now present   Confirmed by Rathi MD, Vikas ([id number]) on 05/11/2021 1:49:45 PM                           Review of Systems:      [x] All other systems reviewed and all negative except as written in HPI      []  Patient unable to provide secondary  to condition        Past Medical History:        Diagnosis  Date         ?  (HFpEF) heart failure with preserved ejection fraction (HCC)       ?  Anxiety and depression       ?  Aortic valve replaced            S/p bovine aortic valve replacement.         ?  Asthma       ?  Chronic narcotic use       ?  Chronic obstructive pulmonary disease (HCC)       ?  Chronic pain       ?  CKD (chronic kidney disease), stage III (HCC)  Baseline creatinine is 1.3-1.4 with GFR in the 40s.         ?  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  GERD (gastroesophageal reflux disease)       ?  History of vascular access device  04/13/2021          4 FR Single PICC for LTABX: R cephalic vessell length 48 CM Max P leave @ 1 CM out; Arm circumferenc 40 CM         ?  Hyperlipidemia       ?  Hypothyroidism       ?  Morbid obesity (HCC)       ?  Neuropathy       ?  Obstructive sleep apnea           ?  Rhinitis            Past Surgical History:         Procedure  Laterality  Date          ?  COLONOSCOPY  N/A  12/01/2020          [facility] Alfred[facility]          ?  HX AORTIC VALVE REPLACEMENT              Bovine bioprosthetic          ?  HX PACEMAKER            Social Hx:  reports that she quit smoking about 11 years ago. Her smoking use included cigarettes. She has a 40.00 pack-year smoking history. She has never used smokeless tobacco. She reports that she does not currently  use alcohol. She reports that she does not use drugs.   Family Hx: family history includes Hypertension in her father and mother.     Allergies        Allergen  Reactions         ?  Nitroglycerin  Unknown (comments)             hypotension          ?  Aloe Vera  Rash     ?  Hydrochlorothiazide  Other (comments)             Reports 'kidneys dry up"          ?  Tetanus And Diphther. Tox (Pf)  Swelling             Swelling of arm and it turns black              OBJECTIVE:     Wt Readings from Last 3 Encounters:        05/16/21  154 kg (339 lb 8.1 oz)     04/24/21  (!) 160.8 kg (354 lb 8 oz)        03/09/21  149.7 kg (330 lb)           Intake/Output Summary (Last 24 hours) at 05/17/2021 0727   Last data filed at 05/16/2021 1800     Gross per 24 hour        Intake  100 ml        Output  --        Net  100 ml                 Physical Exam:      Vitals:  Vitals:             05/16/21 2112  05/16/21 2304  05/17/21 0029  05/17/21 0343           BP:      (!) 96/52  101/64     Pulse:    88  75  75     Resp:      17  17     Temp:      100.2 F (37.9 C)  (!) 101.6 F (38.7 C)     SpO2:  100%    91%  96%     Weight:                   Height:                Telemetry: SR, paced, PVC's/ectopy        Gen: Morbidly obese, in no distress    Neck: Supple, No JVD, No Carotid Bruit   Resp: No accessory muscle use, diminished breath sounds w/ fine crackles in bases    Card: Regular Rate,Rythm, Normal S1, S2, No murmurs, rubs or gallop.    Abd:   obese, Soft, non-tender, non-distended, BS+    MSK: No cyanosis   Skin: No rashes     Neuro: Moving all four extremities, follows commands appropriately   Psych: Fair insight, oriented to person, place, alert, Nml Affect   LE: 3+ edema      Data Review:       Radiology:    XR Results (most recent):   Results from Hospital Encounter encounter on 05/09/21      XR CHEST PORT      Narrative   PORTABLE CHEST RADIOGRAPH/S: 05/14/2021 8:01 AM      INDICATION: Worsening respiratory status.      COMPARISON: 05/09/2021, 04/15/2021, CT 04/14/2021.      TECHNIQUE: Portable frontal radiograph/s of the chest.      FINDINGS:   The lungs are hypoinflated, and there is pulmonary vascular crowding.   Interstitial and alveolar edema are probably  superimposed. The central airways   are patent. No pneumothorax and no large pleural effusion. A right PICC and   pacemaker are in place. The heart is enlarged. Post CABG and aortic valve   replacement.      Impression   Cardiomegaly, pulmonary vascular congestion, probable pulmonary edema.           Recent Labs            05/17/21   0516  05/16/21   0032     NA  133*  133*     K  4.1  4.2     CL  94*  94*     CO2  40*  40*     BUN  28*  31*     CREA  1.32*  1.29*     GLU  126*  134*         CA  9.0  8.6             Recent Labs            05/17/21   0516  05/16/21   0032     WBC  5.8  7.4     HGB  8.0*  8.7*     HCT  27.1*  29.4*         PLT  69*  75*  No results for input(s): PTP, INR, AP, INREXT, INREXT in the last 72 hours.      No lab exists for component: PTTP, GPT, SGOT      No results for input(s): CHOL, LDLC in the last 72 hours.      No lab exists for component: TGL, HDLC,  HBA1C         Current meds:      Current Facility-Administered Medications:    ?  metoprolol succinate (TOPROL-XL) XL tablet 25 mg, 25 mg, Oral, DAILY, Fisher, Georga Hacking, NP, 25 mg at 05/16/21 0847   ?  bumetanide (BUMEX) tablet 1 mg, 1 mg, Oral, BID, Fisher, Georga Hacking, NP, 1 mg at 05/16/21 1912   ?  acetaZOLAMIDE (DIAMOX) tablet 250 mg, 250 mg, Oral, BID, Melynda Keller, MD, 250 mg at 05/16/21 1912   ?  nystatin (MYCOSTATIN) 100,000 unit/gram cream, , Topical, BID, Marca Ancona C, NP, Given at 05/16/21 1914   ?  traZODone (DESYREL) tablet 100 mg, 100 mg, Oral, QHS, Horald Chestnut, DO, 100 mg at 05/16/21 2246   ?  oxyCODONE IR (ROXICODONE) tablet 5 mg, 5 mg, Oral, Q6H PRN, Horald Chestnut, DO, 5 mg at 05/16/21 2246   ?  albuterol-ipratropium (DUO-NEB) 2.5 MG-0.5 MG/3 ML, 3 mL, Nebulization, Q4H PRN, Jamil, Nora, DO, 3 mL at 05/15/21 2003   ?  heparin (porcine) injection 5,000 Units, 5,000 Units, SubCUTAneous, Q8H, Jamil, Nora, DO, 5,000 Units at 05/17/21 0300   ?  insulin lispro (HUMALOG) injection, , SubCUTAneous, AC&HS, Horald Chestnut, DO, 2 Units at 05/16/21 1133   ?  glucose chewable tablet 16 g, 4 Tablet, Oral, PRN, Horald Chestnut, DO   ?  glucagon (GLUCAGEN) injection 1 mg, 1 mg, IntraMUSCular, PRN, Jamil, Arna Medici, DO   ?  dextrose 10% infusion 0-250 mL, 0-250 mL, IntraVENous, PRN, Jamil, Arna Medici, DO   ?  arformoteroL (BROVANA) neb solution 15 mcg, 15 mcg, Nebulization, BID RT, Jamil, Arna Medici, DO, 15 mcg at 05/16/21 2108   ?  budesonide (PULMICORT) 500 mcg/2 ml nebulizer suspension, 500 mcg, Nebulization, BID RT, Jamil, Nora, DO, 500 mcg at 05/16/21 2108   ?  sodium chloride (NS) flush 5-40 mL, 5-40 mL, IntraVENous, Q8H, Melynda Keller, MD, 10 mL at 05/17/21 0556   ?  sodium chloride (NS) flush 5-40 mL, 5-40 mL, IntraVENous, PRN, Melynda Keller, MD   ?  acetaminophen (TYLENOL) tablet 650 mg, 650 mg, Oral, Q6H PRN, 650 mg at 05/17/21 0352 **OR** acetaminophen (TYLENOL) suppository 650 mg, 650 mg, Rectal, Q6H PRN, Melynda Keller, MD   ?  polyethylene glycol (MIRALAX) packet 17 g, 17 g, Oral, DAILY PRN, Melynda Keller, MD   ?  senna (SENOKOT) tablet 8.6 mg, 1 Tablet, Oral, DAILY PRN, Melynda Keller, MD   ?  promethazine (PHENERGAN) tablet 12.5 mg, 12.5 mg, Oral, Q6H PRN **OR** ondansetron (ZOFRAN) injection 4 mg, 4 mg, IntraVENous, Q6H PRN, Melynda Keller, MD, 4 mg at 05/15/21 1709   ?  cefTRIAXone (ROCEPHIN) 2 g in 0.9% sodium chloride 20 mL IV syringe, 2 g, IntraVENous, Q24H, Melynda Keller, MD, 2 g at 05/16/21 0847   ?  aspirin chewable tablet 81 mg, 81 mg, Oral, DAILY, Melynda Keller, MD, 81 mg at 05/16/21 0848   ?  atorvastatin (LIPITOR) tablet 10 mg, 10 mg, Oral, DAILY, Melynda Keller, MD, 10 mg at 05/16/21 0847   ?  citalopram (CELEXA) tablet 40 mg, 40 mg, Oral, DAILY, Omuria,  Gunnar Fusi, MD, 40 mg at 05/16/21 0848   ?  glipiZIDE (GLUCOTROL) tablet 5 mg, 5 mg, Oral, BID WITH MEALS, Melynda Keller, MD, 5 mg at 05/16/21 1912   ?  levothyroxine (SYNTHROID) tablet 150 mcg, 150 mcg, Oral, ACB, Melynda Keller, MD, 150 mcg at  05/17/21 0557   ?  montelukast (SINGULAIR) tablet 10 mg, 10 mg, Oral, QHS, Melynda Keller, MD, 10 mg at 05/16/21 2246   ?  sucralfate (CARAFATE) tablet 1 g, 1 g, Oral, TID, Melynda Keller, MD, 1 g at 05/16/21 2246      Vincent Peyer, NP      [facility]   Call center: (P) [phone]  (F) [phone]         CC:Barrett, Lance Sell, NP

## 2021-05-17 NOTE — Progress Notes (Signed)
Progress Notes by [name], MD at 05/17/21 [phone]                Author: [name], MD  Service: Internal Medicine  Author Type: Physician       Filed: 05/17/21 1636  Date of Service: 05/17/21 0637  Status: Addendum          Editor: [name], MD (Physician)          Related Notes: Original Note by [name], MD (Physician) filed at 05/17/21 1433                    [facility]   [address] [place], [place]  [address]   [phone]             Hospitalist Progress Note            NAME:  Deborah [name]    DOB:  10-05-1955    MRN:  [id number]      Date/Time:  05/17/2021       Patient PCP:  [name], NP      Emergency Contact:     Extended Emergency Contact Information   Primary Emergency Contact: [facility] Phone: [phone]   Mobile Phone: [phone]   Relation: Son   Interpreter needed? No        Code: Full Code       Isolation Precautions: There are currently no Active Isolations                Subjective:        REASON FOR VISIT:  Recheck CHF      HPI & INTERVAL HISTORY:      Deborah [name] is a 66 y.o. female with history that includes HFpEF, COPD, DM, and HTN presents with shortness of breath due to CHF.      4/20: Rapid called as pt was hypotensive and feeling dizzy. SBP down to 86 but now 96.  Feels minimally better.  No CP or SOB but feels nauseated.        4/19: Reports some imrpovement with her breathing at rest/ Still with edema.  Denies CP and abd pain.         ALLERGIES     Allergies        Allergen  Reactions         ?  Nitroglycerin  Unknown (comments)             hypotension         ?  Aloe Vera  Rash     ?  Hydrochlorothiazide  Other (comments)             Reports 'kidneys dry up"          ?  Tetanus And Diphther. Tox (Pf)  Swelling             Swelling of arm and it turns black                       Objective:         Visit Vitals      BP  101/68 (BP 1 Location: Right lower arm, BP Patient Position: At rest)      Pulse  61     Temp  98.6 F (37 C)     Resp  17     Ht  5\' 7"  (1.702  m)     Wt  154 kg (339 lb 8.1 oz)     SpO2  91%        BMI  53.17 kg/m           General: ill-appearing   Head: Normocephalic, without obvious abnormality, atraumatic   Eyes: anicteric sclerae and conjuntiva clear   ENT: lips, mucosa, and tongue normal   Neck: normal, supple, and no tenderness   Lungs: clear to auscultation   Heart: S1, S2 normal, regular rate, and regular rhythm   Abd: obese, not distended, soft, nontender, BS present and normactive   Ext: no cyanosis and bilateral lower extremity 3+ edema   Skin: normal skin color, no rashes, and texture normal   Neuro:  alert, oriented x 3, no defects noted in general exam.   Psych: not anxious, cooperative, appropriate affect         Medications:     Current Facility-Administered Medications          Medication  Dose  Route  Frequency           ?  metoprolol succinate (TOPROL-XL) XL tablet 25 mg   25 mg  Oral  DAILY     ?  bumetanide (BUMEX) tablet 1 mg   1 mg  Oral  BID           ?  acetaZOLAMIDE (DIAMOX) tablet 250 mg   250 mg  Oral  BID           ?  nystatin (MYCOSTATIN) 100,000 unit/gram cream     Topical  BID     ?  traZODone (DESYREL) tablet 100 mg   100 mg  Oral  QHS     ?  oxyCODONE IR (ROXICODONE) tablet 5 mg   5 mg  Oral  Q6H PRN     ?  albuterol-ipratropium (DUO-NEB) 2.5 MG-0.5 MG/3 ML   3 mL  Nebulization  Q4H PRN     ?  heparin (porcine) injection 5,000 Units   5,000 Units  SubCUTAneous  Q8H     ?  insulin lispro (HUMALOG) injection     SubCUTAneous  AC&HS     ?  glucose chewable tablet 16 g   4 Tablet  Oral  PRN     ?  glucagon (GLUCAGEN) injection 1 mg   1 mg  IntraMUSCular  PRN     ?  dextrose 10% infusion 0-250 mL   0-250 mL  IntraVENous  PRN     ?  arformoteroL (BROVANA) neb solution 15 mcg   15 mcg  Nebulization  BID RT     ?  budesonide (PULMICORT) 500 mcg/2 ml nebulizer suspension   500 mcg  Nebulization  BID RT     ?  sodium chloride (NS) flush 5-40 mL   5-40 mL   IntraVENous  Q8H     ?  sodium chloride (NS) flush 5-40 mL   5-40 mL  IntraVENous  PRN     ?  acetaminophen (TYLENOL) tablet 650 mg   650 mg  Oral  Q6H PRN          Or           ?  acetaminophen (TYLENOL) suppository 650 mg   650 mg  Rectal  Q6H PRN     ?  polyethylene glycol (MIRALAX) packet 17 g   17 g  Oral  DAILY PRN     ?  senna (SENOKOT) tablet 8.6  mg   1 Tablet  Oral  DAILY PRN     ?  promethazine (PHENERGAN) tablet 12.5 mg   12.5 mg  Oral  Q6H PRN          Or           ?  ondansetron (ZOFRAN) injection 4 mg   4 mg  IntraVENous  Q6H PRN           ?  cefTRIAXone (ROCEPHIN) 2 g in 0.9% sodium chloride 20 mL IV syringe   2 g  IntraVENous  Q24H           ?  aspirin chewable tablet 81 mg   81 mg  Oral  DAILY     ?  atorvastatin (LIPITOR) tablet 10 mg   10 mg  Oral  DAILY     ?  citalopram (CELEXA) tablet 40 mg   40 mg  Oral  DAILY     ?  glipiZIDE (GLUCOTROL) tablet 5 mg   5 mg  Oral  BID WITH MEALS     ?  levothyroxine (SYNTHROID) tablet 150 mcg   150 mcg  Oral  ACB     ?  montelukast (SINGULAIR) tablet 10 mg   10 mg  Oral  QHS           ?  sucralfate (CARAFATE) tablet 1 g   1 g  Oral  TID            Labs:     Recent Labs           05/17/21   0516     WBC  5.8     HGB  8.0*     HCT  27.1*        PLT  69*             Recent Labs            05/17/21   0516  05/16/21   0032     NA  133*  133*     K  4.1  4.2     CL  94*  94*     CO2  40*  40*     GLU  126*  134*     BUN  28*  31*     CREA  1.32*  1.29*     CA  9.0  8.6         MG   --   2.1              Reviewed:             Radiology:   No results found.            The External notes reviewed:, ER provider's note, labs, imaging studies, medications, consultants notes, and latest ancillary notes was reviewed by me on:  May 17, 2021                  Assessment/Plan:          Acute on chronic heart failure with preserved ejection fraction POA     Appears to be due to inconsistent administration of medications     Echo on 03/2021 showing an EF of 50 to 55%      Remains fluid overloaded      Soft BPs limiting diuresis. Continue Bumex and Diamox     Continue low-sodium diet and fluid restrictions     Cardiology following and  notes reviewed      Hypotension (symptomatic)     Likely medication-related     NO NS bolus due to CHF     Give albumin IV for BP     Check for infection     ABG as she was lethargic but resolved by the time I arrived      Fever     Recent bacteremia. Low grade temp this am     Check UA, CXR, BCX, LA, procalcitonin      Acute on chronic respiratory failure with hypoxia and hypercapnia POA     Due to CHF     Continue supplemental oxygen and treating as above      Bacteremia due to group B Streptococcus (05/09/2021) POA     Had GBS bacteremia complicated by pacemaker vegetation on previous admission     Continue ceftriaxone through 05/23/21     Continue to monitor seems to have had low-grade temp last night      COPD POA     Not in exacerbation.  Continue with Brovana, Pulmicort, as oxygen supplementation      Mild hyponatremia     133 today      likely from diuresis continue to monitor and fluid restriction      Pacemaker / Aortic valve replaced / History of PSVT all POA     Has bioprosthetic AVR     Pacer not MRI compatible      Hypothyroidism     TSH within normal limits continue levothyroxine current dose      Anemia POA     Appears to be AOCD      Anxiety and depression POA     Stable continue Celexa      HLD POA     Continue Lipitor      Body mass index is 53.17 kg/m.:  40 or above:  Morbid obesity and Weight loss advised      F: None   E: Hyponatremia   N: ADULT DIET Regular; Low Sodium (2 gm); 1500 ml   ADULT ORAL NUTRITION SUPPLEMENT Lunch; Protein Modular   ADULT ORAL NUTRITION SUPPLEMENT HS Snack; Fortified Pudding          Risk of deterioration: High      Discussed:  Pt's condition, Imaging findings, Lab findings, Assessment, and Care Plan discussed with: Patient, RN, and Care Manager      Prophylaxis:  Hep SQ       Anticipated discharge disposition:  HHC      HR DC Barriers: Currently not medically stable for discharge         Total critical care time was -35- minutes in direct patient care with this critically ill patient.  (1st 30-34min: 16109)      Addendum 05/17/21 11:06 AM:     Upon review of the labs troponin remains slightly elevated at 57 but no chest pain   Chest x-ray showed poor inspiratory effort but overall did see a significant difference will await official read report   ABG does show elevated PCO2 at 65.8 and a PO2 low at 70   Plan:     Given the patient's current condition with hypotension mild hypoxia and hypercapnia I will be transferring the patient to telemetry.     BP improving with albumin     Cardiology note reviewed      Addendum 05/17/21 2:31 PM:     Pt remains lethargic although BP is only slightly better.  Will need BiPAP     Transfer to SDU     Consider midodrine but with BiPAP may require pressor if nor improving.      Addendum 05/17/21 4:31 PM:     BP continued to deteriorate with SBP of 81 despite albumin   Given her CHF can no bolus and no midodrine due to BiPAP   LA and procalcitonin are unremarkable   UA however, appears consistent with UTI but not a clean catch.  ALready on CTX     Obtain UCX     Repeat troponin and ABGs     Transfer to ICU and start Levo     Intensivist consulted.                ___________________________________________________      Signed:    Donnal MoatHugo M Bettye Sitton, MD

## 2021-05-17 NOTE — Progress Notes (Signed)
Rapid Called at [phone]    Responded to RRT for Blood Pressure hypotensive    Provider at bedside: yes    Interventions ordered: Labs, ABG, and Other (comment)    Sepsis Suspected: yes    Transfer to Higher Level of Care: NO    Pt began experiencing hypotension and confusion. MD at bedside. Pt BP 90s/40s. Labs obtained by phlebotomy.     1015: Pt back to bed. No longer experiencing dizziness. A&Ox4. Pt resting comfortably in no acute distress.     Visit Vitals  BP 100/64   Pulse 81   Temp 97.9 F (36.6 C)   Resp 20   Ht 5\' 7"  (1.702 m)   Wt 154 kg (339 lb 8.1 oz)   SpO2 96%   BMI 53.17 kg/m        Rapid Ended at 1030    RRT RN assisted with transport to accepting unit N/A.    , RN

## 2021-05-17 NOTE — Progress Notes (Signed)
0940Sheralyn Boatman, RN called primary RN into room to assess patients mental status. Patient is confused and complaining of dizziness. Opens her eyes to verbal stimuli only. Patients BP is 90/43. Rapid response called due to AMS and low BP.     0945: MD at bedside along with 2 primary Rns, phlebotomy, RRT RN, nursing supervisor and respiratory. MD placed lab orders and Chest CT. Patient to be placed back in bed from chair with legs elevated to help with BP.     1130: MD placed orders to transfer patient to  higher level of care. Nursing supervisor made aware and is working on having a bed become available.     1230: Patients BP 89/52 with HR of 115. MD made aware.     1530: Patients BP 96/58. Transfer orders have been put in place. Patient to go to bed 3006. Primary RN, Sheralyn Boatman to call report.

## 2021-05-17 NOTE — Consults (Signed)
Consults by Drucie Opitz, MD at 05/17/21 1649                Author: Drucie Opitz, MD  Service: TeleMedicine  Author Type: Physician       Filed: 05/17/21 1753  Date of Service: 05/17/21 1649  Status: Addendum          Editor: Drucie Opitz, MD (Physician)          Related Notes: Original Note by Drucie Opitz, MD (Physician) filed at 05/17/21 1750            Consult Orders        1. IP CONSULT TO INTENSIVIST [[id number]] ordered by [name], MD at 05/17/21 1609                                       [facility]      Tele ICU TEAM Progress Note         Name:  Deborah [name]        DOB:  11/04/1955     MRN:  [id number]        Date:  05/17/2021                 ICU Assessment        1.  Hypotension   2.  Acute on chronic HFpEF   3.  Acute on chronic respiratory failure   4.  Bacteremia GroupB strep   5.  COPD exacerbation   6.  Mild Hyponatremia   7.  Hypothyroidism   8.  Anemia   9.  Anxiety/Depression                  ICU Comprehensive Plan of Care:     NEURO:    # Acute Encephalopathy: likely related to hypercapnia   - Continue BIPAP as below   - Monitor Neuro exam      CV:    # Hypotension: RRT today for SBP 80's, s/p albumin 25g x1 today. SBP now 110's. Unclear if BP cuff readings were consistently accurate. No clear evidence of septic or cardiogenic shock   # Acute on Chronic HFpEF   # s/p pacemaker   # s/p AVR   - repeat BNP in AM   - Start vasopressin if needed to keep MAP >65   - Continue abx as below   - Will adjust diuretics as below   - Replace electrolytes to keep K>4, Mg>2   - Continue asa, statin      PULM:    # Acute on chronic respiratory failure: worsened by pulm edema and possible mild copd exacerbation vs airway edema. CXR today shows no new infiltrative pattern. Of note pt denies having O2 at home   # COPD: Likely a mild copd exacerbation. Pt requiring longer periods of using BIPAP   # OSA uses cpap at home   - Continue Bipap 16/5, RR 12, QHS and w/ naps, and as needed  during the day. Fio2 titrate to keep O2 sat>92%   - Abx as below   - [employer], will start Duoneb ATC Q4   - Continue Budesonide BID, montelukast   - Start short course of steroids, solumedrol 40 mg IV bid, and pending clinical response will taper off in 7-10 days.      GI:    #  GERD   - NPO x meds for now   - Continue sucralfate      RENAL/GU:    # AKI: could be component of ATN?   - Decrease bumex to 0.5 mg BID   - Continue diamox 250 mg BID for now   - Monitor Cr and UOP, if worsening creatinine will consider holding bumex   - Foley in place      ENDO:    # DM2   - holding glipizide   - SSI   - BG Goal 140-180; glycemic control      # Hypothyroidism   - Continue synthroid      HEME/ONC:    # Anemia: no active bleeding   - Tx Hgb < 7, Plt<10k; transfuse as needed      MICRO:     # Bacteremia w/ GroupB strep as noted on admission H&P. Repeat blood cx 4/20 neg.  Procal 4/20 neg.    # Possible UTI   - FU ucx   - Continue cefriaxone (4/12-) until 4/26 per H&P   - FU on Cx; cont broad abx coverage as above      PSYCH:   # Depression, Anxiety: stable   - Continue celexa      FEN/PPX   - No IVF maintenance   - Diet:  NPO x meds   - Prophylaxis: GI: Pt on sucralfate   - DVT:  HSQ       Code Status: Full Code   LOS: 8      Discussed Care Plan with Bedside RN            Subjective:     Progress Note: 05/17/2021        Reason for ICU Admission: SOB      HPI: Pt isa  66 yo F w/ h/o HFpEF, s/p AVR, s/p pacemaker, COPD, CKD3, DM2, GERD, Hypothyroidism, OSA, depression, that p/w sob and b/l leg swelling.  Pt was recently discharged from [facility] rehab on 04/24/21. History is limited to chart review. Pt notes PTA that she has not been receiving her bumex medication on a consistent basis. In the ED on 4/12 pt was admitted for acute on chronic CHF, and  started on bumex IV. Pt also was found to have bacteremia w/ groupB strep started on ceftriaxone. Pt also had noted acute on chronic resp failure w/ higher O2 requirements  than her baseline.        Today a RRT was called as pt was noted to be hypotensive and dizzy. SBP was 86. Pt also had noted low grade fever this AM. Pt was transferred to the ICU for further management. Pt notes that she was  feeling better in terms of her sob initially since being admitted, and then in the last few days her sob sx's feels worse. Pt notes she never used O2 at home in past, and notes she last used her CPAP at home for OSA. Pt has fevers, chills this AM. Pt  has some nausea. Denies ay HA, vomiting, CP, abd pain. Pt had a BM a few days ago. Pt got 2 doses of albumin.            Active Problem List:           Problem List   Date Reviewed: 03/09/2021                          Codes  Class            * (  Principal) Acute on chronic heart failure with preserved ejection fraction (HFpEF) (HCC)  ICD-10-CM: I50.33   ICD-9-CM: 428.23                        Bacteremia due to group B Streptococcus  ICD-10-CM: R78.81, B95.1   ICD-9-CM: 790.7, 041.02                        Chronic respiratory failure with hypoxia (HCC)  ICD-10-CM: J96.11   ICD-9-CM: 518.83, 799.02                        BMI 50.0-59.9, adult (HCC)  ICD-10-CM: B14.78   ICD-9-CM: V85.43                        Hypokalemia due to loss of potassium  ICD-10-CM: E87.6   ICD-9-CM: 276.8                        Acute on chronic respiratory failure with hypoxia and hypercapnia (HCC)  ICD-10-CM: J96.21, J96.22   ICD-9-CM: 518.84, 786.09, 799.02                        OSA (obstructive sleep apnea)  ICD-10-CM: G47.33   ICD-9-CM: 327.23                        COPD (chronic obstructive pulmonary disease) (HCC)  ICD-10-CM: J44.9   ICD-9-CM: 496                        History of PSVT (paroxysmal supraventricular tachycardia)  ICD-10-CM: Z86.79   ICD-9-CM: V12.59                        Anemia  ICD-10-CM: D64.9   ICD-9-CM: 285.9                        Hypomagnesemia  ICD-10-CM: E83.42   ICD-9-CM: 275.2                        Systemic inflammatory response syndrome (SIRS)  (HCC)  ICD-10-CM: R65.10   ICD-9-CM: 995.90                        PVC (premature ventricular contraction)  ICD-10-CM: I49.3   ICD-9-CM: 427.69                        Acute exacerbation of CHF (congestive heart failure) (HCC)  ICD-10-CM: I50.9   ICD-9-CM: 428.0                        Acute on chronic diastolic (congestive) heart failure (HCC)  ICD-10-CM: I50.33   ICD-9-CM: 428.33, 428.0                        CHF (congestive heart failure) (HCC)  ICD-10-CM: I50.9   ICD-9-CM: 428.0                        Elevated bilirubin  ICD-10-CM: R17   ICD-9-CM: 277.4  Liver cirrhosis (HCC)  ICD-10-CM: K74.60   ICD-9-CM: 571.5                        Colitis  ICD-10-CM: K52.9   ICD-9-CM: 558.9                        Thrombocytopenia (HCC)  ICD-10-CM: D69.6   ICD-9-CM: 287.5                        Morbid obesity (HCC)  ICD-10-CM: E66.01   ICD-9-CM: 278.01                        CKD (chronic kidney disease), stage III (HCC)  ICD-10-CM: N18.30   ICD-9-CM: 585.3                        Aortic valve replaced  ICD-10-CM: Z95.2   ICD-9-CM: V43.3                        Pacemaker  ICD-10-CM: Z95.0   ICD-9-CM: V45.01                        Hypothyroidism  ICD-10-CM: E03.9   ICD-9-CM: 244.9                        Asthma  ICD-10-CM: J45.909   ICD-9-CM: 493.90                        Anxiety and depression  ICD-10-CM: F41.9, F32.A   ICD-9-CM: 300.00, 311                        DM (diabetes mellitus), type 2 with complications (HCC)  ICD-10-CM: E11.8   ICD-9-CM: 250.90                        Chronic narcotic use  ICD-10-CM: F11.90   ICD-9-CM: 305.50                        Chronic pain  ICD-10-CM: G89.29   ICD-9-CM: 338.29                        Rhinitis  ICD-10-CM: J31.0   ICD-9-CM: 472.0                        Hyperlipidemia  ICD-10-CM: E78.5   ICD-9-CM: 272.4                        Neuropathy  ICD-10-CM: G62.9   ICD-9-CM: 355.9                        GERD (gastroesophageal reflux disease)  ICD-10-CM: K21.9   ICD-9-CM:  530.81                        Arm paresthesia, left  ICD-10-CM: R20.2   ICD-9-CM: 782.0                         Past Medical History:  has a past medical history of (HFpEF) heart failure with preserved ejection fraction (HCC), Anxiety and depression, Aortic valve replaced, Asthma, Chronic narcotic use, Chronic obstructive pulmonary disease (HCC), Chronic pain, CKD (chronic kidney disease),  stage III (HCC), 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        Past Surgical History:         has a past surgical history that includes hx pacemaker; colonoscopy (N/A, 12/01/2020); and hx aortic valve replacement.        Home Medications:          Prior to Admission medications             Medication  Sig  Start Date  End Date  Taking?  Authorizing Provider            bumetanide (BUMEX) 1 mg tablet  Take 1 Tablet by mouth daily.  04/25/21    Yes  Horald Chestnut, DO     lidocaine 4 % patch  Apply to low back   Apply patch to the affected area for 12 hours a day and remove for 12 hours a day.  04/24/21    Yes  Horald Chestnut, DO     arformoteroL (BROVANA) 15 mcg/2 mL nebu neb solution  2 mL by Nebulization route two (2) times a day.  04/24/21    Yes  Horald Chestnut, DO     miconazole (MICOTIN) 2 % topical powder  Apply  to affected area two (2) times a day. APPLY TO bilateral breasts      Nursing, document site in comments  04/24/21    Yes  Jamil, Arna Medici, DO     atorvastatin (LIPITOR) 10 mg tablet  Take 1 Tablet by mouth daily.      Yes  Provider, Historical     metoprolol succinate (TOPROL-XL) 25 mg XL tablet  Take 1 Tablet by mouth daily.  02/21/21    Yes  Thurston Pounds, MD     levothyroxine (SYNTHROID) 150 mcg tablet  Take 1 Tablet by mouth Daily (before breakfast).  01/27/21    Yes  Tefera, Mesfin, MD     aspirin 81 mg chewable tablet  Take 1  Tablet by mouth daily.  12/03/20    Yes  Carrolyn Meiers, MD     acetaminophen (TYLENOL) 500 mg tablet  Take 2 Tablets by mouth every six (6) hours as needed for Pain.      Yes  Provider, Historical     albuterol (PROVENTIL HFA, VENTOLIN HFA, PROAIR HFA) 90 mcg/actuation inhaler  Take 2 Puffs by inhalation every six (6) hours as needed.      Yes  Provider, Historical     citalopram (CELEXA) 40 mg tablet  Take 1 Tablet by mouth daily.  02/22/20    Yes  Provider, Historical     fluticasone propionate (FLONASE) 50 mcg/actuation nasal spray  2 Sprays by Nasal route daily as needed.      Yes  Provider, Historical     sucralfate (CARAFATE) 1 gram tablet  Take 1 Tablet by mouth three (3) times daily.      Yes  Provider, Historical     traZODone (DESYREL) 100 mg tablet  Take 1 Tablet by mouth nightly.  12/31/19    Yes  Provider, Historical     glimepiride (AMARYL) 2 mg tablet  Take 1 Tablet by  mouth two (2) times a day.      Yes  Provider, Historical            montelukast (SINGULAIR) 10 mg tablet  Take 1 Tablet by mouth nightly.      Yes  Provider, Historical            cefTRIAXone 2 gram 2 g IV syringe  2 g by IntraVENous route every twenty-four (24) hours for 29 days.  04/24/21  05/23/21    Horald ChestnutJamil, Nora, DO            naloxone (Narcan) 4 mg/actuation nasal spray  Use 1 spray intranasally, then discard. Repeat with new spray every 2 min as needed for opioid overdose symptoms, alternating nostrils.  04/24/21      Horald ChestnutJamil, Nora, DO             Allergies/Social/Family History:          Allergies        Allergen  Reactions         ?  Nitroglycerin  Unknown (comments)             hypotension         ?  Aloe Vera  Rash     ?  Hydrochlorothiazide  Other (comments)             Reports 'kidneys dry up"          ?  Tetanus And Diphther. Tox (Pf)  Swelling             Swelling of arm and it turns black           Social History          Tobacco Use         ?  Smoking status:  Former              Packs/day:  1.00         Years:  40.00          Pack years:  40.00         Types:  Cigarettes         Quit date:  03/21/2010         Years since quitting:  11.1         ?  Smokeless tobacco:  Never       Substance Use Topics         ?  Alcohol use:  Not Currently           Family History         Problem  Relation  Age of Onset          ?  Hypertension  Mother            ?  Hypertension  Father               Review of Systems:        Limited to aabove        Objective:     Vital Signs:   Visit Vitals      BP  (!) 82/51     Pulse  81     Temp  97.8 F (36.6 C)     Resp  20     Ht  5\' 7"  (1.702 m)     Wt  154 kg (339 lb 8.1 oz)     SpO2  99%        BMI  53.17  kg/m      O2 Flow Rate (L/min): 3 l/min O2 Device: BIPAP Temp (24hrs), Avg:99 F (37.2 C), Min:97.8 F (36.6 C), Max:101.6 F (38.7 C)               Intake/Output:       Intake/Output Summary (Last 24 hours) at 05/17/2021 1649   Last data filed at 05/17/2021 1407     Gross per 24 hour        Intake  270 ml        Output  0 ml        Net  270 ml           Physical Exam:    General Appearance: NAD, Awake and following commands   Head:  Normocephalic, without obvious abnormality, atraumatic   Lungs:   Respirations unlabored, diminished per nursing   Heart:  Paced rhythm   Abdomen: Soft, NT/ND per nursing   Skin:  Skin color, texture, turgor normal, no rashes or lesions   Extremities: +2 pitting edema up to knees b/l, some erythema in ankles b/l, Hands swollen b/l   Neurologic:  CN 2-12 grossly intact      LABS AND  DATA: Personally reviewed     Recent Labs            05/17/21   0516  05/16/21   0032     WBC  5.8  7.4     HGB  8.0*  8.7*     HCT  27.1*  29.4*         PLT  69*  75*          Recent Labs             05/17/21   0516  05/16/21   0032  05/15/21   0156     NA  133*  133*  135*     K  4.1  4.2  4.5     CL  94*  94*  95*     CO2  40*  40*  42*     BUN  28*  31*  28*     CREA  1.32*  1.29*  1.24*     GLU  126*  134*  115*     CA  9.0  8.6  8.8          MG   --   2.1  1.8        No results for input(s): AP,  TBIL, TP, ALB, GLOB, AML, LPSE in the last 72 hours.      No lab exists for component: SGOT, GPT, AMYP     Recent Labs           05/17/21   1019     PHI  7.37     PCO2I  65.8*        PO2I  70*        No results for input(s): CPK, CKMB, TROIQ, BNPP in the last 72 hours.      Hemodynamics:         PAP:     CO:        Wedge:     CI:        CVP:      SVR:  BP 2: 97/72 (05/14/21 1221)              PVR:           Ventilator Settings:  Mode  Rate  Tidal Volume  Pressure  FiO2  PEEP                         35 %              Peak airway pressure:            Minute ventilation:  10.3 l/min            MEDS: Reviewed          I performed all aspects of the physical examination via Telemedicine associated with two way audio and video communication and with the on-site assistance of Nurse. Patient is critically ill in the ICU.   I  personally  reviewed the pertinent medical  records, laboratory/ pathology data and radiographic images.  Due to  critical illness impairing one or more vital organs of this patient resulting in life threatening clinical situation  I have provided direct, frequent personal  assessment and manipulation  in management plan and spent 35  minutes  of  critical care time excluding the time spent on procedures and teaching.  Greater than 50% of this time  in patient's care was  employed  in counseling and coordination of care and engaged in face to face discussion  of case management issues, addressing questions, and outlining a plan of  therapy.      Ranelle Oyster MD   [facility]   05/17/2021

## 2021-05-17 NOTE — Progress Notes (Signed)
TRANSFER - OUT REPORT:    Verbal report given to Lainey RN(name) on Leighann Amadon  being transferred to ICU(unit) for routine progression of care       Report consisted of patient's Situation, Background, Assessment and   Recommendations(SBAR).     Information from the following report(s) SBAR, Kardex, Procedure Summary, Intake/Output, MAR, and Recent Results was reviewed with the receiving nurse.    Lines:   PICC Single Lumen 04/13/21 Right;Cephalic (Active)   Central Line Being Utilized Yes 05/17/21 1407   Criteria for Appropriate Use Long term IV/antibiotic administration 05/17/21 1407   Site Assessment Clean, dry, & intact 05/17/21 1407   Phlebitis Assessment 0 05/17/21 1407   Infiltration Assessment 0 05/17/21 1407   Date of Last Dressing Change 05/15/21 05/17/21 1407   Dressing Status Clean, dry, & intact 05/17/21 1407   Dressing Type Disk with Chlorhexadine gluconate (CHG) 05/17/21 1407   Hub Color/Line Status Blue;Capped 05/17/21 1407   Action Taken Open ports on tubing capped 05/17/21 1407   Positive Blood Return (Site #1) Yes 05/17/21 1407   Alcohol Cap Used Yes 05/17/21 1407       Peripheral IV 05/09/21 Left Antecubital (Active)   Site Assessment Clean, dry, & intact 05/17/21 1407   Phlebitis Assessment 0 05/17/21 1407   Infiltration Assessment 0 05/17/21 1407   Dressing Status Clean, dry, & intact 05/17/21 1407   Dressing Type Transparent;Tape 05/17/21 1407   Hub Color/Line Status Pink;Capped 05/17/21 1407   Action Taken Open ports on tubing capped 05/17/21 1407   Alcohol Cap Used Yes 05/17/21 1407        Opportunity for questions and clarification was provided.      Patient transported with:   Monitor  Registered Nurse         L

## 2021-05-18 ENCOUNTER — Inpatient Hospital Stay: Admit: 2021-05-18 | Payer: MEDICARE | Primary: Family

## 2021-05-18 LAB — METABOLIC PANEL, BASIC
Anion gap: 0 mmol/L — ABNORMAL LOW (ref 5–15)
Anion gap: 1 mmol/L — ABNORMAL LOW (ref 5–15)
Anion gap: 3 mmol/L — ABNORMAL LOW (ref 5–15)
BUN/Creatinine ratio: 24 — ABNORMAL HIGH (ref 12–20)
BUN/Creatinine ratio: 25 — ABNORMAL HIGH (ref 12–20)
BUN/Creatinine ratio: 26 — ABNORMAL HIGH (ref 12–20)
BUN: 27 MG/DL — ABNORMAL HIGH (ref 6–20)
BUN: 32 MG/DL — ABNORMAL HIGH (ref 6–20)
BUN: 36 MG/DL — ABNORMAL HIGH (ref 6–20)
CO2: 37 mmol/L — ABNORMAL HIGH (ref 21–32)
CO2: 38 mmol/L — ABNORMAL HIGH (ref 21–32)
Calcium: 9.1 MG/DL (ref 8.5–10.1)
Calcium: 9.1 MG/DL (ref 8.5–10.1)
Calcium: 9.2 MG/DL (ref 8.5–10.1)
Chloride: 94 mmol/L — ABNORMAL LOW (ref 97–108)
Chloride: 96 mmol/L — ABNORMAL LOW (ref 97–108)
Chloride: 97 mmol/L (ref 97–108)
Creatinine: 1.14 MG/DL — ABNORMAL HIGH (ref 0.55–1.02)
Creatinine: 1.26 MG/DL — ABNORMAL HIGH (ref 0.55–1.02)
Creatinine: 1.4 MG/DL — ABNORMAL HIGH (ref 0.55–1.02)
Glucose: 203 mg/dL — ABNORMAL HIGH (ref 65–100)
Glucose: 88 mg/dL (ref 65–100)
Potassium: 3.6 mmol/L (ref 3.5–5.1)
Potassium: 4.1 mmol/L (ref 3.5–5.1)
Potassium: 4.4 mmol/L (ref 3.5–5.1)
Sodium: 134 mmol/L — ABNORMAL LOW (ref 136–145)
Sodium: 135 mmol/L — ABNORMAL LOW (ref 136–145)
Sodium: 137 mmol/L (ref 136–145)
eGFR: 42 mL/min/{1.73_m2} — ABNORMAL LOW (ref >60)
eGFR: 48 mL/min/{1.73_m2} — ABNORMAL LOW (ref >60)
eGFR: 54 mL/min/{1.73_m2} — ABNORMAL LOW (ref >60)

## 2021-05-18 LAB — CBC WITH AUTOMATED DIFF
ABS. BASOPHILS: 0 10*3/uL (ref 0.0–0.1)
ABS. EOSINOPHILS: 0 10*3/uL (ref 0.0–0.4)
ABS. IMM. GRANS.: 0.1 10*3/uL — ABNORMAL HIGH (ref 0.00–0.04)
ABS. LYMPHOCYTES: 0.9 10*3/uL (ref 0.8–3.5)
ABS. MONOCYTES: 0.1 10*3/uL (ref 0.0–1.0)
ABS. NEUTROPHILS: 4 10*3/uL (ref 1.8–8.0)
ABSOLUTE NRBC: 0 10*3/uL (ref 0.00–0.01)
BASOPHILS: 0 % (ref 0–1)
EOSINOPHILS: 0 % (ref 0–7)
HCT: 31.1 % — ABNORMAL LOW (ref 35.0–47.0)
HGB: 9.2 g/dL — ABNORMAL LOW (ref 11.5–16.0)
IMMATURE GRANULOCYTES: 1 % — ABNORMAL HIGH (ref 0.0–0.5)
LYMPHOCYTES: 17 % (ref 12–49)
MCH: 28 PG (ref 26.0–34.0)
MCHC: 29.6 g/dL — ABNORMAL LOW (ref 30.0–36.5)
MCV: 94.5 FL (ref 80.0–99.0)
MONOCYTES: 2 % — ABNORMAL LOW (ref 5–13)
MPV: 11.3 FL (ref 8.9–12.9)
NEUTROPHILS: 80 % — ABNORMAL HIGH (ref 32–75)
NRBC: 0 PER 100 WBC
PLATELET: 68 10*3/uL — ABNORMAL LOW (ref 150–400)
RBC: 3.29 M/uL — ABNORMAL LOW (ref 3.80–5.20)
RDW: 15.9 % — ABNORMAL HIGH (ref 11.5–14.5)
WBC: 5.1 10*3/uL (ref 3.6–11.0)

## 2021-05-18 LAB — GLUCOSE, POC
Glucose (POC): 94 mg/dL (ref 65–117)
Glucose (POC): 186 mg/dL — ABNORMAL HIGH (ref 65–117)
Glucose (POC): 174 mg/dL — ABNORMAL HIGH (ref 65–117)
Glucose (POC): 169 mg/dL — ABNORMAL HIGH (ref 65–117)
Glucose (POC): 187 mg/dL — ABNORMAL HIGH (ref 65–117)

## 2021-05-18 LAB — EKG, 12 LEAD, INITIAL
Atrial Rate: 107 {beats}/min
Atrial Rate: 37 {beats}/min
Calculated R Axis: -70 degrees
Calculated R Axis: -71 degrees
Calculated T Axis: 104 degrees
Calculated T Axis: 110 degrees
P-R Interval: 232 ms
Q-T Interval: 362 ms
Q-T Interval: 406 ms
QRS Duration: 170 ms
QRS Duration: 172 ms
QTC Calculation (Bezet): 483 ms
QTC Calculation (Bezet): 569 ms
Ventricular Rate: 107 {beats}/min
Ventricular Rate: 118 {beats}/min

## 2021-05-18 LAB — CBC W/O DIFF
ABSOLUTE NRBC: 0 10*3/uL (ref 0.00–0.01)
HCT: 25.8 % — ABNORMAL LOW (ref 35.0–47.0)
HGB: 7.7 g/dL — ABNORMAL LOW (ref 11.5–16.0)
MCH: 28.4 PG (ref 26.0–34.0)
MCHC: 29.8 g/dL — ABNORMAL LOW (ref 30.0–36.5)
MCV: 95.2 FL (ref 80.0–99.0)
MPV: 10.8 FL (ref 8.9–12.9)
NRBC: 0 PER 100 WBC
PLATELET: 62 10*3/uL — ABNORMAL LOW (ref 150–400)
RBC: 2.71 M/uL — ABNORMAL LOW (ref 3.80–5.20)
RDW: 15.9 % — ABNORMAL HIGH (ref 11.5–14.5)
WBC: 4 10*3/uL (ref 3.6–11.0)

## 2021-05-18 LAB — BLOOD GAS, ARTERIAL
BASE EXCESS: 1.3 mmol/L
BICARBONATE: 29 mmol/L — ABNORMAL HIGH (ref 22–26)
O2 FLOW RATE: 3 L/min
O2 SAT: 98 % — ABNORMAL HIGH (ref 92–97)
PCO2: 56 mmHg — ABNORMAL HIGH (ref 35–45)
PO2: 112 mmHg — ABNORMAL HIGH (ref 80–100)
pH: 7.33 — ABNORMAL LOW (ref 7.35–7.45)

## 2021-05-18 LAB — TROPONIN-HIGH SENSITIVITY: Troponin-High Sensitivity: 28 ng/L (ref 0–51)

## 2021-05-18 LAB — NT-PRO BNP: NT pro-BNP: 5191 PG/ML — ABNORMAL HIGH (ref <125)

## 2021-05-18 LAB — MAGNESIUM
Magnesium: 1.9 mg/dL (ref 1.6–2.4)
Magnesium: 1.9 mg/dL (ref 1.6–2.4)

## 2021-05-18 LAB — CBC
Hematocrit: 25.8 % — ABNORMAL LOW (ref 35.0–47.0)
Hemoglobin: 7.7 g/dL — ABNORMAL LOW (ref 11.5–16.0)
MCH: 28.4 PG (ref 26.0–34.0)
MCHC: 29.8 g/dL — ABNORMAL LOW (ref 30.0–36.5)
MCV: 95.2 FL (ref 80.0–99.0)
MPV: 10.8 FL (ref 8.9–12.9)
NRBC Absolute: 0 10*3/uL (ref 0.00–0.01)
Nucleated RBCs: 0 PER 100 WBC
Platelets: 62 10*3/uL — ABNORMAL LOW (ref 150–400)
RBC: 2.71 M/uL — ABNORMAL LOW (ref 3.80–5.20)
RDW: 15.9 % — ABNORMAL HIGH (ref 11.5–14.5)
WBC: 4 10*3/uL (ref 3.6–11.0)

## 2021-05-18 LAB — BASIC METABOLIC PANEL
Anion Gap: 0 mmol/L — ABNORMAL LOW (ref 5–15)
Anion Gap: 1 mmol/L — ABNORMAL LOW (ref 5–15)
Anion Gap: 3 mmol/L — ABNORMAL LOW (ref 5–15)
BUN: 27 MG/DL — ABNORMAL HIGH (ref 6–20)
BUN: 32 MG/DL — ABNORMAL HIGH (ref 6–20)
BUN: 36 MG/DL — ABNORMAL HIGH (ref 6–20)
Bun/Cre Ratio: 24 — ABNORMAL HIGH (ref 12–20)
Bun/Cre Ratio: 25 — ABNORMAL HIGH (ref 12–20)
Bun/Cre Ratio: 26 — ABNORMAL HIGH (ref 12–20)
CO2: 37 mmol/L — ABNORMAL HIGH (ref 21–32)
CO2: 38 mmol/L — ABNORMAL HIGH (ref 21–32)
CO2: 40 mmol/L — ABNORMAL HIGH (ref 21–32)
Calcium: 9.1 MG/DL (ref 8.5–10.1)
Calcium: 9.1 MG/DL (ref 8.5–10.1)
Calcium: 9.2 MG/DL (ref 8.5–10.1)
Chloride: 94 mmol/L — ABNORMAL LOW (ref 97–108)
Chloride: 96 mmol/L — ABNORMAL LOW (ref 97–108)
Chloride: 97 mmol/L (ref 97–108)
Creatinine: 1.14 MG/DL — ABNORMAL HIGH (ref 0.55–1.02)
Creatinine: 1.26 MG/DL — ABNORMAL HIGH (ref 0.55–1.02)
Creatinine: 1.4 MG/DL — ABNORMAL HIGH (ref 0.55–1.02)
ESTIMATED GLOMERULAR FILTRATION RATE: 42 mL/min/{1.73_m2} — ABNORMAL LOW (ref >60)
ESTIMATED GLOMERULAR FILTRATION RATE: 48 mL/min/{1.73_m2} — ABNORMAL LOW (ref >60)
ESTIMATED GLOMERULAR FILTRATION RATE: 54 mL/min/{1.73_m2} — ABNORMAL LOW (ref >60)
Glucose: 177 mg/dL — ABNORMAL HIGH (ref 65–100)
Glucose: 203 mg/dL — ABNORMAL HIGH (ref 65–100)
Glucose: 88 mg/dL (ref 65–100)
Potassium: 3.6 mmol/L (ref 3.5–5.1)
Potassium: 4.1 mmol/L (ref 3.5–5.1)
Potassium: 4.4 mmol/L (ref 3.5–5.1)
Sodium: 134 mmol/L — ABNORMAL LOW (ref 136–145)
Sodium: 135 mmol/L — ABNORMAL LOW (ref 136–145)
Sodium: 137 mmol/L (ref 136–145)

## 2021-05-18 LAB — CBC WITH AUTO DIFFERENTIAL
Basophils %: 0 % (ref 0–1)
Basophils Absolute: 0 10*3/uL (ref 0.0–0.1)
Eosinophils %: 0 % (ref 0–7)
Eosinophils Absolute: 0 10*3/uL (ref 0.0–0.4)
Granulocyte Absolute Count: 0.1 10*3/uL — ABNORMAL HIGH (ref 0.00–0.04)
Hematocrit: 31.1 % — ABNORMAL LOW (ref 35.0–47.0)
Hemoglobin: 9.2 g/dL — ABNORMAL LOW (ref 11.5–16.0)
Immature Granulocytes: 1 % — ABNORMAL HIGH (ref 0.0–0.5)
Lymphocytes %: 17 % (ref 12–49)
Lymphocytes Absolute: 0.9 10*3/uL (ref 0.8–3.5)
MCH: 28 PG (ref 26.0–34.0)
MCHC: 29.6 g/dL — ABNORMAL LOW (ref 30.0–36.5)
MCV: 94.5 FL (ref 80.0–99.0)
MPV: 11.3 FL (ref 8.9–12.9)
Monocytes %: 2 % — ABNORMAL LOW (ref 5–13)
Monocytes Absolute: 0.1 10*3/uL (ref 0.0–1.0)
NRBC Absolute: 0 10*3/uL (ref 0.00–0.01)
Neutrophils %: 80 % — ABNORMAL HIGH (ref 32–75)
Neutrophils Absolute: 4 10*3/uL (ref 1.8–8.0)
Nucleated RBCs: 0 PER 100 WBC
Platelets: 68 10*3/uL — ABNORMAL LOW (ref 150–400)
RBC: 3.29 M/uL — ABNORMAL LOW (ref 3.80–5.20)
RDW: 15.9 % — ABNORMAL HIGH (ref 11.5–14.5)
WBC: 5.1 10*3/uL (ref 3.6–11.0)

## 2021-05-18 LAB — ARTERIAL BLOOD GASES
Base Excess: 1.3 mmol/L
HCO3: 29 mmol/L — ABNORMAL HIGH (ref 22–26)
LITERS PER MINUTE: 3 L/min
O2 Sat: 98 % — ABNORMAL HIGH (ref 92–97)
PCO2: 56 mmHg — ABNORMAL HIGH (ref 35–45)
PO2: 112 mmHg — ABNORMAL HIGH (ref 80–100)
pH: 7.33 — ABNORMAL LOW (ref 7.35–7.45)

## 2021-05-18 LAB — POCT GLUCOSE
POC Glucose: 94 mg/dL (ref 65–117)
POC Glucose: 186 mg/dL — ABNORMAL HIGH (ref 65–117)
POC Glucose: 174 mg/dL — ABNORMAL HIGH (ref 65–117)
POC Glucose: 169 mg/dL — ABNORMAL HIGH (ref 65–117)
POC Glucose: 187 mg/dL — ABNORMAL HIGH (ref 65–117)

## 2021-05-18 LAB — EKG 12-LEAD
Atrial Rate: 107 {beats}/min
Atrial Rate: 37 {beats}/min
P-R Interval: 232 ms
Q-T Interval: 362 ms
Q-T Interval: 406 ms
QRS Duration: 170 ms
QRS Duration: 172 ms
QTc Calculation (Bazett): 483 ms
QTc Calculation (Bazett): 569 ms
R Axis: -70 degrees
R Axis: -71 degrees
T Axis: 104 degrees
T Axis: 110 degrees
Ventricular Rate: 107 {beats}/min
Ventricular Rate: 118 {beats}/min

## 2021-05-18 LAB — PROBNP, N-TERMINAL: BNP: 5191 PG/ML — ABNORMAL HIGH (ref <125)

## 2021-05-18 LAB — TROPONIN, HIGH SENSITIVITY: Troponin, High Sensitivity: 28 ng/L (ref 0–51)

## 2021-05-18 MED ORDER — IOPAMIDOL 76 % IV SOLN
370 mg iodine /mL (76 %) | INTRAVENOUS | Status: AC
Start: 2021-05-18 — End: 2021-05-19

## 2021-05-18 MED ORDER — IOPAMIDOL 76 % IV SOLN
370 mg iodine /mL (76 %) | Freq: Once | INTRAVENOUS | Status: AC
Start: 2021-05-18 — End: 2021-05-18
  Administered 2021-05-18: 19:00:00 via INTRAVENOUS

## 2021-05-18 MED ORDER — HEPARIN (PORCINE) 5,000 UNIT/ML IJ SOLN
5000 unit/mL | Freq: Three times a day (TID) | INTRAMUSCULAR | Status: AC
Start: 2021-05-18 — End: 2021-05-29
  Administered 2021-05-19 – 2021-05-29 (×30): via SUBCUTANEOUS

## 2021-05-18 MED ORDER — PANTOPRAZOLE 40 MG TAB, DELAYED RELEASE
40 mg | Freq: Every day | ORAL | Status: AC
Start: 2021-05-18 — End: 2021-05-29
  Administered 2021-05-19 – 2021-05-29 (×11): via ORAL

## 2021-05-18 MED FILL — TRAZODONE 100 MG TAB: 100 mg | ORAL | Qty: 1

## 2021-05-18 MED FILL — ISOVUE-370  76 % INTRAVENOUS SOLUTION: 370 mg iodine /mL (76 %) | INTRAVENOUS | Qty: 200

## 2021-05-18 MED FILL — OXYCODONE 5 MG TAB: 5 mg | ORAL | Qty: 1

## 2021-05-18 MED FILL — HEPARIN (PORCINE) 5,000 UNIT/ML IJ SOLN: 5000 unit/mL | INTRAMUSCULAR | Qty: 1

## 2021-05-18 MED FILL — SOLU-MEDROL (PF) 40 MG/ML SOLUTION FOR INJECTION: 40 mg/mL | INTRAMUSCULAR | Qty: 1

## 2021-05-18 MED FILL — CARAFATE 1 GRAM TABLET: 1 gram | ORAL | Qty: 1

## 2021-05-18 MED FILL — NOREPINEPHRINE BITARTRATE 16 MG/250 ML (64 MCG/ML) NS INFUSION: 16 mg/250 mL (64 mcg/mL) | INTRAVENOUS | Qty: 250

## 2021-05-18 MED FILL — INSULIN LISPRO 100 UNIT/ML INJECTION: 100 unit/mL | SUBCUTANEOUS | Qty: 2

## 2021-05-18 MED FILL — BUDESONIDE 0.5 MG/2 ML NEB SUSPENSION: 0.5 mg/2 mL | RESPIRATORY_TRACT | Qty: 1

## 2021-05-18 MED FILL — IPRATROPIUM-ALBUTEROL 2.5 MG-0.5 MG/3 ML NEB SOLUTION: 2.5 mg-0.5 mg/3 ml | RESPIRATORY_TRACT | Qty: 3

## 2021-05-18 MED FILL — BUMETANIDE 1 MG TAB: 1 mg | ORAL | Qty: 1

## 2021-05-18 MED FILL — MONTELUKAST 10 MG TAB: 10 mg | ORAL | Qty: 1

## 2021-05-18 MED FILL — NYSTATIN 100,000 UNIT/G TOPICAL CREAM: 100000 unit/gram | CUTANEOUS | Qty: 15

## 2021-05-18 MED FILL — ATORVASTATIN 10 MG TAB: 10 mg | ORAL | Qty: 1

## 2021-05-18 MED FILL — ACETAZOLAMIDE 250 MG TAB: 250 mg | ORAL | Qty: 1

## 2021-05-18 MED FILL — CITALOPRAM 20 MG TAB: 20 mg | ORAL | Qty: 2

## 2021-05-18 MED FILL — ASPIRIN 81 MG CHEWABLE TAB: 81 mg | ORAL | Qty: 1

## 2021-05-18 MED FILL — CEFTRIAXONE 2 GRAM SOLUTION FOR INJECTION: 2 gram | INTRAMUSCULAR | Qty: 2

## 2021-05-18 NOTE — Progress Notes (Signed)
Progress Notes by Tawanna Sat, MD at 05/18/21 0938                Author: Tawanna Sat, MD  Service: TeleMedicine  Author Type: Physician       Filed: 05/18/21 1107  Date of Service: 05/18/21 0910  Status: Addendum          Editor: Tawanna Sat, MD (Physician)          Related Notes: Original Note by Tawanna Sat, MD (Physician) filed at 05/18/21 0957                        [facility]  [facility]      Tele ICU TEAM Progress Note         Name:  Deborah [name]        DOB:  1955-08-01     MRN:  [id number]        Date:  05/18/2021                 ICU Assessment        1.  Shock   2.  Acute on chronic HFpEF   3.  Acute on chronic respiratory failure   4.  Bacteremia GroupB strep   5.  COPD exacerbation   6.  Mild Hyponatremia   7.  Hypothyroidism   8.  Anemia   9.  Anxiety/Depression                  ICU Comprehensive Plan of Care:     NEURO:    # Acute Encephalopathy: likely related to hypercapnia   -- Off BiPAP this a.m., repeat ABGs reviewed-7.33/56/112/29.     - Monitor Neuro exam      CV:    # Hypotension: Ongoing levo @   # Acute on Chronic HFpEF   # s/p pacemaker   # s/p AVR   - On po bumex   - Replace electrolytes to keep K>4, Mg>2   - Continue asa, statin      PULM:    # Acute on chronic respiratory failure: worsened by pulm edema and possible mild copd exacerbation vs airway edema. CXR 4/20 shows no new infiltrative pattern. Of note pt denies having O2 at home   # COPD: Likely a mild copd exacerbation. Pt requiring longer periods of using BIPAP   # OSA uses cpap at home   - Continue Bipap 16/5, RR 12, QHS and w/ naps, and as needed during the day. Fio2 titrate to keep O2 sat>92%   - Abx as below   - [employer], Duoneb ATC Q4   - Continue Budesonide BID, montelukast   - Start short course of steroids, solumedrol 40 mg IV bid, and pending clinical response will taper off in 7-10 days.      GI:    # GERD   - NPO x meds for now   - Continue sucralfate      RENAL/GU:    #  AKI: could be component of ATN?   - On po bumex to 0.5 mg BID   - Monitor Cr and UOP   - Foley in place      ENDO:    # DM2   - holding glipizide   - SSI   - BG Goal 140-180; glycemic control      # Hypothyroidism   - Continue synthroid      HEME/ONC:    #  Anemia: no active bleeding   - Tx Hgb < 7, Plt<10k; transfuse as needed   Thrombocytopenia unchanged.  Holding heparin subcu secondary to dyspnea.      MICRO:     # Bacteremia w/ GroupB strep as noted on admission H&P. Repeat blood cx 4/20 neg.  Procal 4/20 neg.    # Possible UTI   - FU ucx   - Continue cefriaxone (4/12-) until 4/26       PSYCH:   # Depression, Anxiety: stable   - Continue celexa      FEN/PPX   - No IVF maintenance   - Diet:  NPO x meds   - Prophylaxis: GI: pepcid    - DVT: Holding heparin secondary to thrombocytopenia, SCDs       Code Status: Full Code   LOS: 9      Discussed Care Plan with Bedside RN            Subjective:     Progress Note: 05/18/2021        Reason for ICU Admission: SOB      ICU consult 4/20: Pt isa  66 yo F w/ h/o HFpEF, s/p AVR, s/p pacemaker, COPD, CKD3, DM2, GERD, Hypothyroidism, OSA, depression, that p/w sob and  b/l leg swelling. Pt was recently discharged from [facility] rehab on 04/24/21. History is limited to chart review. Pt notes PTA that she has not been receiving her bumex medication on a consistent basis. In the ED on 4/12 pt was admitted for acute on  chronic CHF, and started on bumex IV. Pt also was found to have bacteremia w/ groupB strep started on ceftriaxone. Pt also had noted acute on chronic resp failure w/ higher O2 requirements than her baseline.        Today a RRT was called as pt was noted to be hypotensive and dizzy. SBP was 86. Pt also had noted low grade fever this AM. Pt was transferred to the ICU for further management. Pt notes that she was  feeling better in terms of her sob initially since being admitted, and then in the last few days her sob sx's feels worse. Pt notes she never used O2 at home  in past, and notes she last used her CPAP at home for OSA. Pt has fevers, chills this AM. Pt  has some nausea. Denies ay HA, vomiting, CP, abd pain. Pt had a BM a few days ago. Pt got 2 doses of albumin.          Subjective-   Patient still encephalopathic this morning, off BiPAP   Repeat ABG reviewed 7.33/56/112/29   On Levophed at 4 mcg        Active Problem List:           Problem List   Date Reviewed: 03/09/2021                          Codes  Class            * (Principal) Acute on chronic heart failure with preserved ejection fraction (HFpEF) (HCC)  ICD-10-CM: I50.33   ICD-9-CM: 428.23                        Bacteremia due to group B Streptococcus  ICD-10-CM: R78.81, B95.1   ICD-9-CM: 790.7, 041.02  Chronic respiratory failure with hypoxia (HCC)  ICD-10-CM: J96.11   ICD-9-CM: 518.83, 799.02                        BMI 50.0-59.9, adult (HCC)  ICD-10-CM: Q65.78   ICD-9-CM: V85.43                        Hypokalemia due to loss of potassium  ICD-10-CM: E87.6   ICD-9-CM: 276.8                        Acute on chronic respiratory failure with hypoxia and hypercapnia (HCC)  ICD-10-CM: J96.21, J96.22   ICD-9-CM: 518.84, 786.09, 799.02                        OSA (obstructive sleep apnea)  ICD-10-CM: G47.33   ICD-9-CM: 327.23                        COPD (chronic obstructive pulmonary disease) (HCC)  ICD-10-CM: J44.9   ICD-9-CM: 496                        History of PSVT (paroxysmal supraventricular tachycardia)  ICD-10-CM: Z86.79   ICD-9-CM: V12.59                        Anemia  ICD-10-CM: D64.9   ICD-9-CM: 285.9                        Hypomagnesemia  ICD-10-CM: E83.42   ICD-9-CM: 275.2                        Systemic inflammatory response syndrome (SIRS) (HCC)  ICD-10-CM: R65.10   ICD-9-CM: 995.90                        PVC (premature ventricular contraction)  ICD-10-CM: I49.3   ICD-9-CM: 427.69                        Acute exacerbation of CHF (congestive heart failure) (HCC)  ICD-10-CM: I50.9    ICD-9-CM: 428.0                        Acute on chronic diastolic (congestive) heart failure (HCC)  ICD-10-CM: I50.33   ICD-9-CM: 428.33, 428.0                        CHF (congestive heart failure) (HCC)  ICD-10-CM: I50.9   ICD-9-CM: 428.0                        Elevated bilirubin  ICD-10-CM: R17   ICD-9-CM: 277.4                        Liver cirrhosis (HCC)  ICD-10-CM: K74.60   ICD-9-CM: 571.5                        Colitis  ICD-10-CM: K52.9   ICD-9-CM: 558.9                        Thrombocytopenia (HCC)  ICD-10-CM: D69.6   ICD-9-CM: 287.5                        Morbid obesity (HCC)  ICD-10-CM: E66.01   ICD-9-CM: 278.01                        CKD (chronic kidney disease), stage III (HCC)  ICD-10-CM: N18.30   ICD-9-CM: 585.3                        Aortic valve replaced  ICD-10-CM: Z95.2   ICD-9-CM: V43.3                        Pacemaker  ICD-10-CM: Z95.0   ICD-9-CM: V45.01                        Hypothyroidism  ICD-10-CM: E03.9   ICD-9-CM: 244.9                        Asthma  ICD-10-CM: J45.909   ICD-9-CM: 493.90                        Anxiety and depression  ICD-10-CM: F41.9, F32.A   ICD-9-CM: 300.00, 311                        DM (diabetes mellitus), type 2 with complications (HCC)  ICD-10-CM: E11.8   ICD-9-CM: 250.90                        Chronic narcotic use  ICD-10-CM: F11.90   ICD-9-CM: 305.50                        Chronic pain  ICD-10-CM: G89.29   ICD-9-CM: 338.29                        Rhinitis  ICD-10-CM: J31.0   ICD-9-CM: 472.0                        Hyperlipidemia  ICD-10-CM: E78.5   ICD-9-CM: 272.4                        Neuropathy  ICD-10-CM: G62.9   ICD-9-CM: 355.9                        GERD (gastroesophageal reflux disease)  ICD-10-CM: K21.9   ICD-9-CM: 530.81                        Arm paresthesia, left  ICD-10-CM: R20.2   ICD-9-CM: 782.0                      Past Medical History:         has a past medical history of (HFpEF) heart failure with preserved ejection fraction (HCC), Anxiety and  depression, Aortic valve replaced, Asthma, Chronic narcotic use, Chronic obstructive pulmonary disease (HCC), Chronic pain, CKD (chronic kidney disease),  stage III (HCC), 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  Past Surgical History:         has a past surgical history that includes hx pacemaker; colonoscopy (N/A, 12/01/2020); and hx aortic valve replacement.        Home Medications:          Prior to Admission medications             Medication  Sig  Start Date  End Date  Taking?  Authorizing Provider            bumetanide (BUMEX) 1 mg tablet  Take 1 Tablet by mouth daily.  04/25/21    Yes  Horald Chestnut, DO     lidocaine 4 % patch  Apply to low back   Apply patch to the affected area for 12 hours a day and remove for 12 hours a day.  04/24/21    Yes  Horald Chestnut, DO     arformoteroL (BROVANA) 15 mcg/2 mL nebu neb solution  2 mL by Nebulization route two (2) times a day.  04/24/21    Yes  Horald Chestnut, DO     miconazole (MICOTIN) 2 % topical powder  Apply  to affected area two (2) times a day. APPLY TO bilateral breasts      Nursing, document site in comments  04/24/21    Yes  Jamil, Arna Medici, DO     atorvastatin (LIPITOR) 10 mg tablet  Take 1 Tablet by mouth daily.      Yes  Provider, Historical     metoprolol succinate (TOPROL-XL) 25 mg XL tablet  Take 1 Tablet by mouth daily.  02/21/21    Yes  Thurston Pounds, MD     levothyroxine (SYNTHROID) 150 mcg tablet  Take 1 Tablet by mouth Daily (before breakfast).  01/27/21    Yes  Tefera, Mesfin, MD     aspirin 81 mg chewable tablet  Take 1 Tablet by mouth daily.  12/03/20    Yes  Carrolyn Meiers, MD     acetaminophen (TYLENOL) 500 mg tablet  Take 2 Tablets by mouth every six (6) hours as needed for Pain.      Yes  Provider, Historical     albuterol (PROVENTIL HFA, VENTOLIN HFA, PROAIR HFA)  90 mcg/actuation inhaler  Take 2 Puffs by inhalation every six (6) hours as needed.      Yes  Provider, Historical     citalopram (CELEXA) 40 mg tablet  Take 1 Tablet by mouth daily.  02/22/20    Yes  Provider, Historical     fluticasone propionate (FLONASE) 50 mcg/actuation nasal spray  2 Sprays by Nasal route daily as needed.      Yes  Provider, Historical     sucralfate (CARAFATE) 1 gram tablet  Take 1 Tablet by mouth three (3) times daily.      Yes  Provider, Historical     traZODone (DESYREL) 100 mg tablet  Take 1 Tablet by mouth nightly.  12/31/19    Yes  Provider, Historical     glimepiride (AMARYL) 2 mg tablet  Take 1 Tablet by mouth two (2) times a day.      Yes  Provider, Historical     montelukast (SINGULAIR) 10 mg tablet  Take 1 Tablet by mouth nightly.      Yes  Provider, Historical            cefTRIAXone 2 gram 2 g IV syringe  2 g by IntraVENous route every twenty-four (24) hours for 29 days.  04/24/21  05/23/21    Horald Chestnut, DO            naloxone (Narcan) 4 mg/actuation nasal spray  Use 1 spray intranasally, then discard. Repeat with new spray every 2 min as needed for opioid overdose symptoms, alternating nostrils.  04/24/21      Horald Chestnut, DO             Allergies/Social/Family History:          Allergies        Allergen  Reactions         ?  Nitroglycerin  Unknown (comments)             hypotension         ?  Aloe Vera  Rash     ?  Hydrochlorothiazide  Other (comments)             Reports 'kidneys dry up"          ?  Tetanus And Diphther. Tox (Pf)  Swelling             Swelling of arm and it turns black           Social History          Tobacco Use         ?  Smoking status:  Former              Packs/day:  1.00         Years:  40.00         Pack years:  40.00         Types:  Cigarettes         Quit date:  03/21/2010         Years since quitting:  11.1         ?  Smokeless tobacco:  Never       Substance Use Topics         ?  Alcohol use:  Not Currently           Family History         Problem   Relation  Age of Onset          ?  Hypertension  Mother            ?  Hypertension  Father               Review of Systems:        Limited to above        Objective:     Vital Signs:   Visit Vitals      BP  (!) 107/56     Pulse  66     Temp  97.3 F (36.3 C)     Resp  13     Ht   (1.702 m)     Wt  (!) 160.7 kg (354 lb 4.5 oz)     SpO2  100%        BMI  55.49 kg/m      O2 Flow Rate (L/min): 3 l/min O2 Device: Nasal cannula Temp (24hrs), Avg:98 F (36.7 C), Min:97.3 F (36.3 C), Max:98.6 F (37 C)               Intake/Output:       Intake/Output Summary (Last 24 hours) at 05/18/2021 0910   Last data filed at 05/18/2021 0811     Gross per 24 hour  Intake  345.07 ml        Output  900 ml        Net  -554.93 ml              Physical Exam:    General Appearance: NAD, encephalopathic but arousable and following commands   Head:  Normocephalic, without obvious abnormality, atraumatic   Lungs:   Respirations unlabored, symmetric chest rise   Heart:  Paced rhythm   Abdomen: Soft, NT/ND per nursing   Skin:  Skin color, texture, turgor normal, no rashes or lesions   Extremities: +2 pitting edema bilateral lower extremities   Neurologic:  CN 2-12 grossly intact      LABS AND  DATA: Personally reviewed     Recent Labs            05/18/21   0417  05/17/21   2056     WBC  5.1  4.0     HGB  9.2*  7.7*     HCT  31.1*  25.8*         PLT  68*  62*             Recent Labs              05/18/21   0417  05/17/21   2056  05/17/21   0516  05/16/21   0032     NA  135*  137    < >  133*     K  4.4  3.6    < >  4.2     CL  96*  97    < >  94*     CO2  38*  40*    < >  40*     BUN  32*  27*    < >  31*     CREA  1.26*  1.14*    < >  1.29*     GLU  177*  88    < >  134*     CA  9.2  9.1    < >  8.6     MG   --   1.9   --   2.1        < > = values in this interval not displayed.             Recent Labs           05/17/21   1745     AP  45     TP  7.0     ALB  3.0*        GLOB  4.0          Recent Labs           05/17/21   1019      PHI  7.37     PCO2I  65.8*        PO2I  70*           No results for input(s): CPK, CKMB, TROIQ, BNPP in the last 72 hours.      Hemodynamics:         PAP:     CO:        Wedge:     CI:        CVP:      SVR:  BP 2: 97/72 (05/14/21 1221)              PVR:  Ventilator Settings:          Mode  Rate  Tidal Volume  Pressure  FiO2  PEEP                         32 %              Peak airway pressure:            Minute ventilation:  8.4 l/min            MEDS: Reviewed          I performed all aspects of the physical examination via Telemedicine associated with two way audio and video communication and with the on-site assistance of Nurse. Patient is critically ill in the ICU.   I  personally  reviewed the pertinent medical  records, laboratory/ pathology data and radiographic images.  Due to  critical illness impairing one or more vital organs of this patient resulting in life threatening clinical situation  I have provided direct, frequent personal  assessment and manipulation  in management plan and spent 35  minutes  of  critical care time excluding the time spent on procedures and teaching.  Greater than 50% of this time  in patient's care was  employed  in counseling and coordination of care and engaged in face to face discussion  of case management issues, addressing questions, and outlining a plan of  therapy.      Tawanna Sat MD   [facility]   05/18/2021

## 2021-05-18 NOTE — Progress Notes (Signed)
Progress  Notes by Truitt Leep, RN at 05/18/21 2008                Author: Truitt Leep, RN  Service: --  Author Type: Registered Nurse       Filed: 05/19/21 0634  Date of Service: 05/18/21 2008  Status: Addendum          Editor: Truitt Leep, RN (Registered Nurse)          Related Notes: Original Note by Truitt Leep, RN (Registered Nurse) filed at 05/19/21 0310               1900: Bedside and Verbal shift change report given to Demetrios Loll, RN (oncoming nurse) by Judeth Cornfield, RN (offgoing nurse). Report included the following information SBAR, Kardex, Intake/Output,  MAR, Recent Results, Cardiac Rhythm A. Paced, Alarm Parameters , and Quality Measures.       1930: Patient complaining of 5/10 chest discomfort. EKG taken that shows ventricular paced rhythm with HR 115. Martie Lee, NP at bedside. Patient back into A. Paced with no chest pain. NP acknowledged that this was not the first time patient has had rhythm      2143: Sabrina, NP contacted in regards to patient having hard stools that are unable to bypass. Orders for tap water enema.       0007Martie Lee, NP notified of BP being labile. Orders placed for neo due to step-down status.       0400: Reassessment completed; see flowsheet      0700: Bedside and Verbal shift change report given to Thayer Ohm, Charity fundraiser (oncoming nurse) by Demetrios Loll, RN (offgoing nurse). Report included the following information SBAR, Kardex, Intake/Output, MAR, Recent Results, Cardiac Rhythm A.Paced, Alarm Parameters , and Quality  Measures.       Stroke Education provided to patient and the following topics were discussed      1. Patients personal risk factors for stroke are atrial fibrillation, hyperlipidemia, diabetes mellitus, obesity, and CHF      2. Warning signs of Stroke:          * Sudden numbness or weakness of the face, arm or leg, especially on one side of           The body              * Sudden confusion, trouble speaking or understanding          * Sudden trouble seeing in one or both  eyes          * Sudden trouble walking, dizziness, loss of balance or coordination          * Sudden severe headache with no known cause         3. Importance of activation Emergency Medical Services ( 9-1-1 ) immediately if experience any warning signs of stroke.      4. Be sure and schedule a follow-up appointment with your primary care doctor or any specialists as instructed.       5. You must take medicine every day to treat your risk factors for stroke.  Be sure to take your medicines exactly as your doctor tells you: no more, no less.  Know what your medicines are for , what they do.  Anti-thrombotics /anticoagulants can help  prevent strokes.  You are taking the following medicine(s)  Eliquis       6.  Smoking and second-hand smoke greatly increase your risk of stroke, cardiovascular disease and death. Smoking  history never      7. Information provided was Stroke Handouts      8. Documentation of teaching completed in Patient Education Activity and on Care Plan with teaching response noted?  yes

## 2021-05-18 NOTE — Progress Notes (Signed)
1415 Code stroke. Sherlon Handing aware. Pupils unequal Rt 8/9 Lt 3/4. And rt sided visual disturbance.     Signs and symptoms: Rt pupil 8/9 Lt pupil 3/4. Rt Sided visual disturbance   Time of Code Stroke Activation:  Provider at bedside time:    VAN score: Negative  Last Known Well (Time): 1200  Blood Glucose Result/Time: 161   Blood Pressure: 1409: 114/51 MAP 69  Anticoagulants (List medications): aspirin, heparin

## 2021-05-18 NOTE — Progress Notes (Signed)
Transition of Care Plan - RUR 29%:   Medical management continues - patient transitioned to ICU after rapid response.  On BiPAP overnight, now on 3L NC  CM following - patient resides with her son and daughter-in-law.  She recently was at [facility] [place] for rehab (does not want to return).  HH has been arranged with [facility].   PT/OT following  Cardiology following- on IV Ceftriaxone through 4/26    Alexis Goodell, LCSW

## 2021-05-18 NOTE — Progress Notes (Signed)
Rapid Called at 1415    Responded to RRT at 1416 for CODE STROKE  Signs and symptoms: Unequal pupils R>L   Time of Code Stroke Activation:1145  Provider at bedside time:  1147  VAN score: Negative  Last Known Well (Time): 1200  Blood Glucose Result/Time: 161   Blood Pressure: 114/51  Anticoagulants (List medications): ASA, heparin     Provider at bedside: yes    Interventions ordered: Code Stroke protocol     Sepsis Suspected: no    Transfer to Higher Level of Care: N/A      Visit Vitals  BP 95/63 (BP 1 Location: Left lower arm, BP Patient Position: Sitting)   Pulse 87   Temp 97.3 F (36.3 C)   Resp 14   Ht 5\' 7"  (1.702 m)   Wt (!) 160.7 kg (354 lb 4.5 oz)   SpO2 100%   BMI 55.49 kg/m        Rapid Ended at 1450    RRT RN assisted with transport to accepting unit N/A. Assisted with transport to and from CT    , RN

## 2021-05-18 NOTE — Progress Notes (Signed)
Problem: Mobility Impaired (Adult and Pediatric)  Goal: *Acute Goals and Plan of Care (Insert Text)  Description: FUNCTIONAL STATUS PRIOR TO ADMISSION: Patient was modified independent using a single point cane for household and community amb. Recent hospital admission and d/c to SN[facility] x last 2+ weeks then readmit    HOME SUPPORT PRIOR TO ADMISSION: The patient lived with son's family but did not require assist.    Physical Therapy Goals  Initiated 05/12/2021; reviewed 4/21 and remain appropriate as below   1.  Patient will move from supine to sit and sit to supine , scoot up and down, and roll side to side in bed with modified independence within 7 day(s).    2.  Patient will transfer from bed to chair and chair to bed with modified independence using the least restrictive device within 7 day(s).  3.  Patient will perform sit to stand with modified independence within 7 day(s).  4.  Patient will ambulate with modified independence for 50 feet with the least restrictive device within 7 day(s).   5.  Patient will ascend/descend 5 stairs with 1 handrail(s) and SPC with modified independence within 7 day(s).   Outcome: Progressing Towards Goal   PHYSICAL THERAPY TREATMENT: WEEKLY REASSESSMENT  Patient: Deborah [name] (66 y.o. female)  Date: 05/18/2021  Primary Diagnosis: Acute on chronic heart failure with preserved ejection fraction (HFpEF) (HCC) [I50.33]       Precautions:   Fall      ASSESSMENT  Patient continues with skilled PT services and is slowly progressing towards goals. Chart reviewed prior to session and timing of session coordinated with RN who removed patient from BIPAP prior to PT. Patient received on 3L NC with good saturations >90% throughout session. Patient performs bed mobility to seated edge of bed with supervision and good static sitting balance. Blood pressure initially drops to 95/63 however recovers to 113/40 within 1 minute with retake. Patient sits edge of bed for approximately 5 minutes.  Noted increased R hand edema, removed patient's bracelet and loosened the others 2/2 this. Instructed patient in frequent hand closing and opening to address. Patient tolerates sit > stand transfer with SPC and CGA while standing blood pressure takes. However after approximately 30s patient requests to sit down secondary to increased dizziness. Once seated this PT noticed altered pupil size and turned on the room's full lights. R pupil very dilated compared to the L. RN called to room immediately and calls code S. Patient tolerates scooting posterior back in to bed with SBA and modAx2 to return to supine (simply due to timeliness for neuro assessment). Strength and sensation intact throughout. R pupil size appears to dilate further over the next few minutes. Patient off of the floor to CT.     Vitals:    05/18/21 1357 05/18/21 1403   BP: 132/62 95/63   BP 2:  113/40 retake   BP 1 Location: Left lower arm Left lower arm   BP Patient Position: Semi fowlers at start of session Sitting at edge of bed    Pulse: 67 87   Pulse 2:  87   Temp:     Resp:     SpO2: 99% 100%          Patient's progression toward goals since last assessment: Code S during session today;     Current Level of Function Impacting Discharge (mobility/balance): CGA sit > stand    Functional Outcome Measure:  The patient scored 19/24 on the [facility] outcome  measure which is indicative of reduced odds of requiring post acute PT needs.      Other factors to consider for discharge: none additional         PLAN :  Goals have been updated based on progression since last assessment.  Patient continues to benefit from skilled intervention to address the above impairments.    Recommendations and Planned Interventions: bed mobility training, transfer training, gait training, therapeutic exercises, edema management/control, patient and family training/education, and therapeutic activities      Frequency/Duration: Patient will be followed by physical therapy:  5  times a week to address goals.    Recommendation for discharge: (in order for the patient to meet his/her long term goals)  To be determined: previously recommending HH    This discharge recommendation:  Has not yet been discussed the attending provider and/or case management    IF patient discharges home will need the following DME: to be determined (TBD)         SUBJECTIVE:   Patient stated "no." re: asking patient if she normally has different pupil sizes.     OBJECTIVE DATA SUMMARY:   Patient received supine in bed and was agreeable to participate in PT session.   Patient was cleared by nursing to participate in PT session.     HISTORY:    Past Medical History:   Diagnosis Date    (HFpEF) heart failure with preserved ejection fraction (HCC)     Anxiety and depression     Aortic valve replaced     S/p bovine aortic valve replacement.    Asthma     Chronic narcotic use     Chronic obstructive pulmonary disease (HCC)     Chronic pain     CKD (chronic kidney disease), stage III (HCC)     Baseline creatinine is 1.3-1.4 with GFR in the 40s.    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)     GERD (gastroesophageal reflux disease)     History of vascular access device 04/13/2021    4 FR Single PICC for LTABX: R cephalic vessell length 48 CM Max P leave @ 1 CM out; Arm circumferenc 40 CM    Hyperlipidemia     Hypothyroidism     Morbid obesity (HCC)     Neuropathy     Obstructive sleep apnea     Rhinitis      Past Surgical History:   Procedure Laterality Date    COLONOSCOPY N/A 12/01/2020    [facility]alk[facility] Alfred[facility]    HX AORTIC VALVE REPLACEMENT      Bovine bioprosthetic    HX PACEMAKER         Personal factors and/or comorbidities impacting plan of care: none additional    Home Situation  Home Environment: Private residence  # Steps to Enter: 5  Rails to Enter: Yes  Hand Rails : Right  One/Two Story Residence: One story  Living Alone: No  Support Systems: Child(ren)  Patient Expects to be  Discharged to:: Home with home health  Current DME Used/Available at Home: Gilmer Morane, straight, Environmental consultantWalker, rolling, Wheelchair  Tub or Shower Type: Tub/Shower combination    EXAMINATION/PRESENTATION/DECISION MAKING:   Critical Behavior:  Neurologic State: Drowsy  Orientation Level: Oriented X4  Cognition: Follows commands  Safety/Judgement: Awareness of environment  Hearing:  Auditory  Auditory Impairment: None  Skin:  all observed intact   Edema:   Range Of Motion:  AROM: Generally decreased,  functional                       Strength:    Strength: Generally decreased, functional                    Tone & Sensation:   Tone: Normal              Sensation: Intact               Coordination:  Coordination: Generally decreased, functional  Vision:      Functional Mobility:  Bed Mobility:  Rolling: Supervision  Supine to Sit: Supervision  Sit to Supine: Moderate assistance;Assist x2 (2/2 time constraint to get to CT)  Scooting: Supervision  Transfers:  Sit to Stand: Contact guard assistance  Stand to Sit: Contact guard assistance                       Balance:   Sitting: Intact;With support  Standing: Impaired;With support  Standing - Static: Good;Constant support  Standing - Dynamic : Not tested  Ambulation/Gait Training:                                            Therapeutic Exercises:       Functional Measure:  [employer] AM-PAC      Basic Mobility Inpatient Short Form (6-Clicks) Version 2  How much HELP from another person do you currently need... (If the patient hasn't done an activity recently, how much help from another person do you think they would need if they tried?) Total A Lot A Little None   1.  Turning from your back to your side while in a flat bed without using bedrails? []   1 []   2 []   3  [x]   4   2.  Moving from lying on your back to sitting on the side of a flat bed without using bedrails? []   1 []   2 []   3  [x]   4   3.  Moving to and from a bed to a chair (including a wheelchair)? []   1 []   2 [x]   3   []   4   4. Standing up from a chair using your arms (e.g. wheelchair or bedside chair)? []   1 []   2 [x]   3  []   4   5.  Walking in hospital room? []   1 []   2 [x]   3  []   4   6.  Climbing 3-5 steps with a railing? []   1 [x]   2 []   3  []   4     Raw Score: 19/24                            Cutoff score ?171,2,3 had higher odds of discharging home with home health or need of SNF/IPR.    1. , , Vinoth , Passek, . .  Validity of the AM-PAC "6-Clicks" Inpatient Daily Activity and Basic Mobility Short Forms. Physical Therapy Mar 2014, [phone]) 379-391; DOI: 10.2522/ptj.20130199  2. . Association of AM-PAC "6-Clicks" Basic Mobility and Daily Activity Scores With Discharge Destination. Phys Ther. 2021 Apr 4;101(4):pzab043. doi: 10.1093/ptj/pzab043. PMID: .  3. Herbold J, Rajaraman D, Lubertha Basque, Agayby K, [place] S. Activity Measure for Post-Acute Care "6-Clicks" Basic Mobility Scores Predict Discharge Destination After Acute Care Hospitalization in Select Patient Groups: A Retrospective, Observational Study. Arch Rehabil Res Clin Transl. 2022 Jul 16;4(3):100204. doi: 10.1016/j.arrct.6712.458099. PMID: 83382505; PMCID: [id number].  4. Josefina Do, Coster W, Ni P. AM-PAC Short Forms Manual 4.0. Revised 02/2018.        Pain Rating:  Patient without reports of pain during therapy      Activity Tolerance:   Fair and requires rest breaks    After treatment patient left in no apparent distress:   Supine in bed off to CT    COMMUNICATION/EDUCATION:   The patient's plan of care was discussed with: Occupational therapist and Registered nurse.     Fall prevention education was provided and the patient/caregiver indicated understanding. and Patient/family have participated as able in goal setting and plan of care.    Thank you for this referral.  Chari Manning PT, DPT   Time Calculation: 25 mins

## 2021-05-18 NOTE — Progress Notes (Signed)
Progress  Notes by Darrold Junker I at 05/18/21 0730                Author: Darrold Junker I  Service: --  Author Type: Registered Nurse       Filed: 05/18/21 1528  Date of Service: 05/18/21 0730  Status: Addendum          Editor: Darrold Junker I (Registered Nurse)          Related Notes: Original Note by Darrold Junker I (Registered Nurse) filed at 05/18/21 1033                     08:02- discussed heparin and low platelet count with Dr. Sherlon Handing. He held heparin, SCDs were on patient.       09:46- levophed stopped      09:55- per intensivist, BIPAP placed on patient. Plan is to keep BIPAP on for 4 hours and take off for 2 hours. She was more alert. Plan discussed with patient. She was educated on the importance of wearing BIPAP overnight.       14:12- informed by physical therapist that her pupils were unequal. Code stroke called. Blood sugar done. Her right pupil was an eight and her left eye was a 4. She was following commands. Completed full NIH with teleneurologist in CT. Dr. Sherlon Handing assessed  patient while in CT.       Bedside and Verbal shift change report given to Judeth Cornfield (Cabin crew) by Demetrios Loll (offgoing nurse). Report included the following information SBAR, Kardex, and Cardiac Rhythm a paced.         Primary Nurse Earleen Reaper and Bry, RN performed a dual skin assessment on this patient No  impairment noted   Braden score is see flowsheet

## 2021-05-18 NOTE — Progress Notes (Signed)
Progress Notes by [name], MD at 05/18/21 0981                Author: [name], MD  Service: Internal Medicine  Author Type: Physician       Filed: 05/19/21 1002  Date of Service: 05/18/21 0702  Status: Addendum          Editor: [name], MD (Physician)          Related Notes: Original Note by [name], MD (Physician) filed at 05/18/21 0809                    [facility]   [address] [place], [place]  [address]   [phone]             Hospitalist Progress Note            NAME:  Deborah [name]    DOB:  11/03/1955    MRN:  [id number]      Date/Time:  05/18/2021       Patient PCP:  Deborah Nones, NP      Emergency Contact:     Extended Emergency Contact Information   Primary Emergency Contact: [facility] Phone: [phone]   Mobile Phone: [phone]   Relation: Son   Interpreter needed? No        Code: Full Code       Isolation Precautions: There are currently no Active Isolations                Subjective:        REASON FOR VISIT:  Recheck CHF      HPI & INTERVAL HISTORY:      [name] is a 66 y.o. female with history that includes HFpEF, COPD, DM, and HTN presents with shortness of breath due to CHF.      4/21: Seen and examined in ICU. She remained on BiPAP and Norepi throughout the night.  She is still on both. She is still lethargic but answers questions.  Denies CP and SOB while on BiPAP.      4/20: Rapid called as pt was hypotensive and feeling dizzy. SBP down to 86 but now 96.  Feels minimally better.  No CP or SOB but feels nauseated.        4/19: Reports some imrpovement with her breathing at rest/ Still with edema.  Denies CP and abd pain.         ALLERGIES     Allergies        Allergen  Reactions         ?  Nitroglycerin  Unknown (comments)             hypotension         ?  Aloe Vera  Rash     ?  Hydrochlorothiazide  Other (comments)             Reports 'kidneys dry up"          ?  Tetanus And Diphther. Tox (Pf)   Swelling             Swelling of arm and it turns black                       Objective:         Visit Vitals      BP  104/60  Pulse  82     Temp  98.1 F (36.7 C)     Resp  14     Ht  5\' 7"  (1.702 m)     Wt  (!) 160.7 kg (354 lb 4.5 oz)     SpO2  100%        BMI  55.49 kg/m           General: no distress, morbidly obese, and ill-appearing   Head: Normocephalic, without obvious abnormality, atraumatic   Eyes: anicteric sclerae and conjuntiva clear   ENT: lips, mucosa, and tongue normal   Neck: normal, supple, and no tenderness   Lungs: CTA and decreased breath sounds   Heart: S1, S2 normal, regular rate, and regular rhythm   Abd: obese, not distended, soft, nontender, BS present and normactive   Ext: no cyanosis and bilateral lower extremity 3+ edema   Skin: normal skin color, no rashes, and texture normal   Neuro:  alert, oriented, no defects noted in general exam.   Psych: not anxious, cooperative, appropriate affect         Medications:     Current Facility-Administered Medications          Medication  Dose  Route  Frequency           ?  NOREPINephrine (LEVOPHED) 16 mg in 0.9% sodium chloride 250 mL infusion   0.5-30 mcg/min  IntraVENous  TITRATE           ?  vasopressin (VASOSTRICT) 20 Units in 0.9% sodium chloride 100 mL infusion   0-0.03 Units/min  IntraVENous  TITRATE           ?  methylPREDNISolone (PF) (SOLU-MEDROL) injection 40 mg   40 mg  IntraVENous  Q12H     ?  albuterol-ipratropium (DUO-NEB) 2.5 MG-0.5 MG/3 ML   3 mL  Nebulization  Q4H RT     ?  bumetanide (BUMEX) tablet 0.5 mg   0.5 mg  Oral  BID     ?  metoprolol succinate (TOPROL-XL) XL tablet 25 mg   25 mg  Oral  DAILY     ?  acetaZOLAMIDE (DIAMOX) tablet 250 mg   250 mg  Oral  BID     ?  nystatin (MYCOSTATIN) 100,000 unit/gram cream     Topical  BID     ?  traZODone (DESYREL) tablet 100 mg   100 mg  Oral  QHS     ?  oxyCODONE IR (ROXICODONE) tablet 5 mg   5 mg  Oral  Q6H PRN     ?  albuterol-ipratropium (DUO-NEB) 2.5 MG-0.5 MG/3 ML   3 mL   Nebulization  Q4H PRN     ?  heparin (porcine) injection 5,000 Units   5,000 Units  SubCUTAneous  Q8H     ?  insulin lispro (HUMALOG) injection     SubCUTAneous  AC&HS     ?  glucose chewable tablet 16 g   4 Tablet  Oral  PRN     ?  glucagon (GLUCAGEN) injection 1 mg   1 mg  IntraMUSCular  PRN     ?  dextrose 10% infusion 0-250 mL   0-250 mL  IntraVENous  PRN     ?  [Held by provider] arformoteroL (BROVANA) neb solution 15 mcg   15 mcg  Nebulization  BID RT     ?  budesonide (PULMICORT) 500 mcg/2 ml nebulizer suspension   500 mcg  Nebulization  BID RT     ?  sodium chloride (NS) flush 5-40 mL   5-40 mL  IntraVENous  Q8H     ?  sodium chloride (NS) flush 5-40 mL   5-40 mL  IntraVENous  PRN           ?  acetaminophen (TYLENOL) tablet 650 mg   650 mg  Oral  Q6H PRN          Or           ?  acetaminophen (TYLENOL) suppository 650 mg   650 mg  Rectal  Q6H PRN     ?  polyethylene glycol (MIRALAX) packet 17 g   17 g  Oral  DAILY PRN     ?  senna (SENOKOT) tablet 8.6 mg   1 Tablet  Oral  DAILY PRN     ?  promethazine (PHENERGAN) tablet 12.5 mg   12.5 mg  Oral  Q6H PRN          Or           ?  ondansetron (ZOFRAN) injection 4 mg   4 mg  IntraVENous  Q6H PRN     ?  cefTRIAXone (ROCEPHIN) 2 g in 0.9% sodium chloride 20 mL IV syringe   2 g  IntraVENous  Q24H     ?  aspirin chewable tablet 81 mg   81 mg  Oral  DAILY     ?  atorvastatin (LIPITOR) tablet 10 mg   10 mg  Oral  DAILY     ?  citalopram (CELEXA) tablet 40 mg   40 mg  Oral  DAILY     ?  [Held by provider] glipiZIDE (GLUCOTROL) tablet 5 mg   5 mg  Oral  BID WITH MEALS     ?  levothyroxine (SYNTHROID) tablet 150 mcg   150 mcg  Oral  ACB     ?  montelukast (SINGULAIR) tablet 10 mg   10 mg  Oral  QHS           ?  sucralfate (CARAFATE) tablet 1 g   1 g  Oral  TID            Labs:     Recent Labs           05/18/21   0417     WBC  5.1     HGB  9.2*     HCT  31.1*        PLT  68*             Recent Labs             05/18/21   0417  05/17/21   2056  05/17/21   1745     NA  135*   137   --      K  4.4  3.6   --      CL  96*  97   --      CO2  38*  40*   --      GLU  177*  88   --      BUN  32*  27*   --      CREA  1.26*  1.14*   --      CA  9.2  9.1   --      MG   --   1.9   --      ALB   --    --  3.0*     TBILI   --    --   1.4*          ALT   --    --   17              Reviewed:             Radiology:   XR CHEST PORT      Result Date: 05/17/2021   1. The lungs demonstrate decreasing pulmonary edema             The External notes reviewed:, ER provider's note, labs, imaging studies, medications, consultants notes, and latest ancillary notes was reviewed by me on:  May 18, 2021                  Assessment/Plan:          Acute on chronic heart failure with preserved ejection fraction POA     Appears to be due to inconsistent administration of medications     Echo on 03/2021 showing an EF of 50 to 55%     Remains fluid overloaded      Soft BPs limiting diuresis.      Defer diuresis to cards now that pt is on pressors     Continue low-sodium diet and fluid restrictions when off BiPAP     Cardiology following and notes reviewed      Hypotension requiring pressor     Continue Norepi... try to wean     Consider Midodrine when off BiPAP     NO NS bolus due to CHF     Gave albumin IV for BP yesterday with no improvement     Check for infection     ABG as she was lethargic but resolved by the time I arrived      Fever possible UTI     Recent bacteremia.      UA consistent with UTI      UCX pending      Acute on chronic respiratory failure with hypoxia and hypercapnia  with COPD AE     Due to CHF     BiPAP for now wean as tolerated     Continue with Brovana, Pulmicort, as oxygen supplementation      Acute encephalopathy likely d/t hypercapnia     Treat as above      Bacteremia due to group B Streptococcus (05/09/2021) POA     Had GBS bacteremia complicated by pacemaker vegetation on previous admission     Continue ceftriaxone through 05/23/21     Continue to monitor seems to  have had low-grade temp last night      COPD POA     Not in exacerbation.        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     Has bioprosthetic AVR     Pacer not MRI compatible      Hypothyroidism     TSH within normal limits continue levothyroxine current dose      Anemia POA     Appears to be AOCD      Thrombocytopenia     Hold heparin and use SCDs      Anxiety and depression POA     Stable continue Celexa      HLD  POA     Continue Lipitor      Body mass index is 55.49 kg/m.:  40 or above:  Morbid obesity and Weight loss advised      F: None   E: Hyponatremia   N: ADULT ORAL NUTRITION SUPPLEMENT Lunch; Protein Modular   ADULT ORAL NUTRITION SUPPLEMENT HS Snack; Fortified Pudding   DIET NPO Sips of Water with Meds          Risk of deterioration: High      Discussed:  Pt's condition, Imaging findings, Lab findings, Assessment, and Care Plan discussed with: Patient, RN, and Care Manager      Prophylaxis:  SCD's and No anticoagulation due to thrombocytopenia      Anticipated discharge disposition:  HHC      HR DC Barriers: Currently not medically stable for discharge         Total time: 60 minutes **I personally saw and examined the patient during this time period**         Addendum 05/19/21 10:01 AM:     Patient was taken to CT for anisocoria.  Please see separate note for this rapid response.                ___________________________________________________      Signed:    [name], MD

## 2021-05-18 NOTE — Progress Notes (Signed)
Stroke Education provided to patient and the following topics were discussed    1. Patients personal risk factors for stroke are hypertension and hyperlipidemia    2. Warning signs of Stroke:        * Sudden numbness or weakness of the face, arm or leg, especially on one side of          The body            * Sudden confusion, trouble speaking or understanding        * Sudden trouble seeing in one or both eyes        * Sudden trouble walking, dizziness, loss of balance or coordination        * Sudden severe headache with no known cause      3. Importance of activation Emergency Medical Services ( 9-1-1 ) immediately if experience any warning signs of stroke.    4. Be sure and schedule a follow-up appointment with your primary care doctor or any specialists as instructed.     5. You must take medicine every day to treat your risk factors for stroke.  Be sure to take your medicines exactly as your doctor tells you: no more, no less.  Know what your medicines are for , what they do.  Anti-thrombotics /anticoagulants can help prevent strokes.  You are taking the following medicine(s) low dose aspirin, statin    6.  Smoking and second-hand smoke greatly increase your risk of stroke, cardiovascular disease and death. Quit smoke 8 years ago    7. Information provided was [employer] Stroke Investment banker, operational Education    8. Documentation of teaching completed in Patient Education Activity and on Care Plan with teaching response noted?  yes    Problem: TIA/CVA Stroke: 0-24 hours  Goal: Activity/Safety  Outcome: Progressing Towards Goal  Goal: Diagnostic Test/Procedures  Outcome: Progressing Towards Goal  Goal: Nutrition/Diet  Outcome: Progressing Towards Goal  Goal: Medications  Outcome: Progressing Towards Goal  Goal: Respiratory  Outcome: Progressing Towards Goal  Goal: Treatments/Interventions/Procedures  Outcome: Progressing Towards Goal  Goal: *Hemodynamically stable  Outcome: Progressing Towards Goal  Goal:  *Neurologically stable  Description: Absence of additional neurological deficits    Outcome: Progressing Towards Goal  Goal: *Stroke education started(Stroke Metric)  Outcome: Progressing Towards Goal  Goal: *Dysphagia screen performed(Stroke Metric)  Outcome: Progressing Towards Goal

## 2021-05-18 NOTE — Progress Notes (Signed)
Progress  Notes by Waunita Schooner, OT at 05/18/21 1445                Author: Waunita Schooner, OT  Service: Occupational Therapy  Author Type: Occupational Therapist       Filed: 05/18/21 1641  Date of Service: 05/18/21 1445  Status: Signed          Editor: Waunita Schooner, OT (Occupational Therapist)               Occupational Therapy Note:   Chart reviewed.  Patient with Code S called this afternoon prior to attempting OT tx d/t acute pupil changes (R pupil significantly more dilated than L).  She is currently undergoing neurological work-up.  Will continue to follow.   Waunita Schooner, OTR/L

## 2021-05-18 NOTE — Progress Notes (Signed)
Progress Notes by Sharla Kidney, NP at 05/18/21 [phone]                Author: Sharla Kidney, NP  Service: Cardiology  Author Type: Nurse Practitioner       Filed: 05/18/21 0842  Date of Service: 05/18/21 0837  Status: Attested           Editor: Sharla Kidney, NP (Nurse Practitioner)  Cosigner: Lilli Few, MD at 05/18/21 [phone]          Attestation signed by Lilli Few, MD at 05/18/21 [phone] (Updated)          Pt personally seen and examined. Chart reviewed.       Agree with advanced NP's history, exam and  A/P with changes/additons.       Overnight events reviewed - Pt transferred to ICU - Hypotension - on low dose levophed gtt       Blood pressure (!) 101/59, pulse 67, temperature 97.3 F (36.3 C), resp. rate 19,  height  (1.702 m), weight (!) 354 lb 4.5 oz (160.7 kg), SpO2 100 %.                     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 On low dose PO diuretics- uptitrate as tolerated ( low BP);   Hx Afib s/p PPM ( not MRI compatible)/ S/p bioprosthetic AVR- intermittent episodes of SVT with atrial tracking V paced rhythm - PPM interrogated - functioning normally - PVCs - no Afib documented recently   Bacteremia - Gp B Strep Has had Pacer lead vegetation in the past/ resolved on subsequent Echo- on IV abx- has been seen by ID previously. Low grade fever / WBC 5.1/ 05/17/21 - last Blood cx -   Hypoxia/Hypercapnia/Resp failure/COPD   DM    CKD  - Cr 1.3   Thrombocytopenia   Morbid obesity            Will see prn over weekend; Plz call if needed       Discussed with patient/nursing/       Marlin Canary. MD, [facility]                                                                                                    [place] [facility] CARDIOLOGY                     Cardiology Care Note       Initial  Encounter      Follow-up        Patient Name: Deborah [name] - DOB:[birth year] - WJX:[id number]   Primary Cardiologist: Sondra Barges Cardiology Physicians: Thurston Pounds MD   Consulting Cardiologist: Sondra Barges Cardiology Physicians: Drue Second MD  Reason for encounter: CHF       HPI:         Deborah [name] is a 66 y.o. female with PMH of HF p EF, a fib, possible atrial lead vegetation treated abx, repeat ECHO with no evidence of vegetation with COPD, A/C resp failure who was recently discharged from hospital on  04/24/2021 to [facility] for rehab.       She says that she had not been receiving her Bumex on a consistent basis and missed several days dosing.       She started having a progressively worsening SOB associated with lower extremity swelling. She says she was using oxygen but required supplementation in the ED. She denies any cough or fever. No chills, nausea or vomiting.      Subjective:        Deborah [name] is lethargic. Transferred to ICU d/t worsening hypotension and AMS. Remains on levo 4 mcg          Assessment and Plan          1 A/C HF p EF: seems as if her diuretics were not being administered at SNF. On PO bumex, diamox - not able to take PO, hold for now given concern for sepsis       2 Hx a fib s/p PPM (not MRI compatible)/ s/p AVR: ectopy noted on tele.  Has had bigemny and intermittent episodes of SVT with Pacer tracking at 150 bpm, episodes have decreased. Pacer interrogated,  functioning normally w/ increased PVC's. Mostly this is ectopy/V paced rhythm, if BP improves may resume metoprolol      3 Bacteremia due to group B Streptococcus: during recent admission, she had GBS bacteremia complicated by pacemaker line vegetation. Continue IV Ceftriaxone scheduled to run through 05/23/21.       4 Acute on chronic respiratory failure with hypoxia and hypercapnia/COPD: required bipap for short time, now back on NC       5 Hypokalemia: replete lytes        6 Hypotension, fevers: on levo, workup  per primary team        7 CKD       8 Obesity: Body mass index is 55.49 kg/m.       9 Thrombocytopenia       10 DM          ____________________________________________________________      Cardiac testing   04/08/21      ECHO ADULT FOLLOW-UP OR LIMITED 04/17/2021 04/17/2021      Interpretation Summary   ?  Aortic Valve: Not well visualized. Edwards bovine bioprosthetic valve with a size of 25 mm. Mild regurgitation.  Stenosis of the aortic valve. AV mean gradient is 38 mmHg. AV area by continuity VTI is 1.0 cm2.   ?  Technical qualifiers: Echo study was technically difficult due to patient's body habitus.      Signed by: Candyce Churn, DO on 04/17/2021  5:00 PM         02/16/21      NUCLEAR CARDIAC STRESS TEST 02/20/2021 02/26/2021      Interpretation Summary   ?  Nuclear Findings: LV perfusion is normal. There is no evidence of inducible ischemia.   ?  Nuclear Findings: The defect appears to be caused by breast attenuation.   ?  ECG: Resting ECG demonstrates normal sinus rhythm.   ?  ECG: Stress ECG was negative for ischemia.   ?  Stress Test: A pharmacological stress  test was performed using lexiscan. Hemodynamics are adequate for diagnosis. Blood pressure demonstrated  a normal response and heart rate demonstrated a normal response to stress. The patient's heart rate recovery was normal. The patient reported dyspnea and no chest pain during the stress test.      Ventricular bigeminy   Unifocal PVC, RBBB and superior axis PVCs      Signed by: Thurston Pounds, MD on 02/20/2021  5:00 PM      Most recent HS troponins:     Recent Labs            05/17/21   1745  05/17/21   1002         TROPHS  47  57*              ECG:      EKG Results                  Procedure  720  Value  Units  Date/Time           EKG, 12 LEAD, INITIAL [[id number]]         Order Status: Sent         EKG, 12 LEAD, INITIAL [[id number]]  Collected: 05/17/21 2134       Order Status: Completed  Updated: 05/17/21 2134                Ventricular Rate  107  BPM            Atrial Rate  107  BPM           P-R Interval  232  ms           QRS Duration  170  ms           Q-T Interval  362  ms           QTC Calculation (Bezet)  483  ms           Calculated R Axis  -71  degrees           Calculated T Axis  110  degrees                Diagnosis  --             Atrial-sensed ventricular-paced rhythm with prolonged AV conduction   Abnormal ECG   When compared with ECG of 11-May-2021 20:56,   Electronic ventricular pacemaker has replaced Electronic atrial pacemaker              EKG, 12 LEAD, INITIAL [[id number]]  Collected: 05/11/21 2056       Order Status: Completed  Updated: 05/14/21 1706                Ventricular Rate  84  BPM           Atrial Rate  84  BPM           P-R Interval  168  ms           QRS Duration  162  ms           Q-T Interval  388  ms           QTC Calculation (Bezet)  458  ms           Calculated P Axis  68  degrees           Calculated R Axis  -102  degrees  Calculated T Axis  11  degrees                Diagnosis  --             Sinus rhythm with frequent atrial-paced complexes and premature    supraventricular complexes in a pattern of bigeminy   Right bundle branch block   Septal infarct , age undetermined   T wave abnormality, consider lateral ischemia   Abnormal ECG      Confirmed by Barry Dienes, M.D., John ([id number]) on 05/14/2021 5:06:24 PM              EKG, 12 LEAD, INITIAL [[id number]]  Collected: 05/11/21 2005       Order Status: Completed  Updated: 05/14/21 1706                Ventricular Rate  116  BPM           Atrial Rate  116  BPM           P-R Interval  200  ms           QRS Duration  190  ms           Q-T Interval  368  ms           QTC Calculation (Bezet)  511  ms           Calculated R Axis  -72  degrees           Calculated T Axis  105  degrees                Diagnosis  --             Atrial-sensed ventricular-paced rhythm   Abnormal ECG   Confirmed by Barry Dienes, M.D., John ([id number]) on 05/14/2021 5:06:15 PM              EKG, 12 LEAD, INITIAL [[id number]]   Collected: 05/11/21 1531       Order Status: Completed  Updated: 05/14/21 1706                Ventricular Rate  83  BPM           Atrial Rate  83  BPM           P-R Interval  160  ms           QRS Duration  160  ms           Q-T Interval  368  ms           QTC Calculation (Bezet)  432  ms           Calculated P Axis  64  degrees           Calculated R Axis  -100  degrees           Calculated T Axis  16  degrees                Diagnosis  --             Sinus rhythm with frequent atrial-paced complexes and premature    supraventricular complexes in a pattern of bigeminy   Right bundle branch block   Possible Lateral infarct , age undetermined   Abnormal ECG   Confirmed by Barry Dienes, M.D., John ([id number]) on 05/14/2021 5:06:02 PM              EKG 12 LEAD INITIAL [[id number]]  Collected: 05/10/21 0635       Order Status: Completed  Updated: 05/12/21 1304                Ventricular Rate  80  BPM           Atrial Rate  60  BPM           P-R Interval  212  ms           QRS Duration  112  ms           Q-T Interval  526  ms           QTC Calculation (Bezet)  606  ms           Calculated P Axis  61  degrees           Calculated R Axis  -55  degrees           Calculated T Axis  -158  degrees                Diagnosis  --             Atrial-paced rhythm with prolonged AV conduction with frequent premature    ventricular complexes   Incomplete right bundle branch block   Left anterior fascicular block   Septal infarct , age undetermined   T wave abnormality, consider lateral ischemia   Prolonged QT   Abnormal ECG   When compared with ECG of 09-May-2021 20:16,   No significant change      Confirmed by Rathi MD, Vikas ([id number]) on 05/12/2021 1:04:16 PM              EKG, 12 LEAD, INITIAL [[id number]]         Order Status: Sent         EKG 12 LEAD INITIAL [[id number]]  Collected: 05/09/21 2016       Order Status: Completed  Updated: 05/11/21 1349                Ventricular Rate  97  BPM           Atrial Rate  49  BPM           P-R Interval  160   ms           QRS Duration  160  ms           Q-T Interval  350  ms           QTC Calculation (Bezet)  444  ms           Calculated P Axis  67  degrees           Calculated R Axis  -117  degrees           Calculated T Axis  6  degrees                Diagnosis  --             Sinus bradycardia with marked sinus arrhythmia with frequent atrial-paced    complexes and with frequent and consecutive premature ventricular complexes   Right bundle branch block   Possible Lateral infarct , age undetermined   Abnormal ECG   When compared with ECG of 13-Apr-2021 22:14,   premature ventricular complexes are now present   premature atrial complexes are no longer present   Borderline criteria for Lateral infarct are now present   Confirmed by  Rathi MD, Willette Pa [phone]) on 05/11/2021 1:49:45 PM                           Review of Systems:      [x] All other systems reviewed and all negative except as written in HPI      []  Patient unable to provide secondary  to condition        Past Medical History:        Diagnosis  Date         ?  (HFpEF) heart failure with preserved ejection fraction (HCC)       ?  Anxiety and depression       ?  Aortic valve replaced            S/p bovine aortic valve replacement.         ?  Asthma       ?  Chronic narcotic use       ?  Chronic obstructive pulmonary disease (HCC)       ?  Chronic pain       ?  CKD (chronic kidney disease), stage III (HCC)            Baseline creatinine is 1.3-1.4 with GFR in the 40s.         ?  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  GERD (gastroesophageal reflux disease)       ?  History of vascular access device  04/13/2021          4 FR Single PICC for LTABX: R cephalic vessell length 48 CM Max P leave @ 1 CM out; Arm circumferenc 40 CM         ?  Hyperlipidemia       ?  Hypothyroidism       ?  Morbid obesity (HCC)       ?  Neuropathy       ?  Obstructive sleep apnea           ?  Rhinitis            Past Surgical History:         Procedure  Laterality  Date          ?   COLONOSCOPY  N/A  12/01/2020          COLONOSCOPY performed by 04/15/2021, MD at [facility] ENDOSCOPY          ?  HX AORTIC VALVE REPLACEMENT              Bovine bioprosthetic          ?  HX PACEMAKER            Social Hx:  reports that she quit smoking about 11 years ago. Her smoking use included cigarettes. She has a 40.00 pack-year smoking history. She has never used smokeless tobacco. She reports that she does not currently  use alcohol. She reports that she does not use drugs.   Family Hx: family history includes Hypertension in her father and mother.     Allergies        Allergen  Reactions         ?  Nitroglycerin  Unknown (comments)             hypotension         ?  Aloe Vera  Rash     ?  Hydrochlorothiazide  Other (comments)             Reports 'kidneys dry up"          ?  Tetanus And Diphther. Tox (Pf)  Swelling             Swelling of arm and it turns black              OBJECTIVE:     Wt Readings from Last 3 Encounters:        05/18/21  (!) 160.7 kg (354 lb 4.5 oz)     04/24/21  (!) 160.8 kg (354 lb 8 oz)        03/09/21  149.7 kg (330 lb)           Intake/Output Summary (Last 24 hours) at 05/18/2021 0838   Last data filed at 05/18/2021 0600     Gross per 24 hour        Intake  313.87 ml        Output  900 ml        Net  -586.13 ml              Physical Exam:      Vitals:      Vitals:             05/18/21 0645  05/18/21 0700  05/18/21 0715  05/18/21 0724           BP:  (!) 106/56  112/61  (!) 111/56       Pulse:  74  73  74       Resp:  15  11  14        Temp:             SpO2:  100%  100%  100%  100%     Weight:                   Height:                Telemetry: SR, paced, PVC's/ectopy        Gen: Morbidly obese, in no distress. Lethargic     Neck: Supple, No JVD, No Carotid Bruit   Resp: No accessory muscle use, diminished breath sounds w/ fine crackles in bases    Card: Regular Rate,Rythm, Normal S1, S2, No murmurs, rubs or gallop.    Abd:   obese, Soft, non-tender, non-distended, BS+    MSK: No cyanosis    Skin: No rashes     Neuro: Moving all four extremities, follows some commands, falls asleep quickly    Psych: Fair insight, oriented to person, place, alert, Nml Affect   LE: 3+ edema      Data Review:       Radiology:    XR Results (most recent):   Results from Hospital Encounter encounter on 05/09/21      XR CHEST PORT      Narrative   EXAM:  XR CHEST PORT      INDICATION: Fever      COMPARISON: 05/14/2021      TECHNIQUE: Upright portable chest AP view 1047 hours      FINDINGS: The patient is on a cardiac monitor. There is no change in the   moderate cardiomegaly. Pacemaker remains in place. The lungs demonstrate   decrease in bilateral pulmonary edema. Mild areas of atelectasis are noted at   the lung bases.      Right-sided PICC  line overlies the SVC.      Impression   1. The lungs demonstrate decreasing pulmonary edema           Recent Labs            05/18/21   0417  05/17/21   2056     NA  135*  137     K  4.4  3.6     CL  96*  97     CO2  38*  40*     BUN  32*  27*     CREA  1.26*  1.14*     GLU  177*  88         CA  9.2  9.1          Recent Labs            05/18/21   0417  05/17/21   2056     WBC  5.1  4.0     HGB  9.2*  7.7*     HCT  31.1*  25.8*         PLT  68*  62*          Recent Labs           05/17/21   1745        AP  45           No results for input(s): CHOL, LDLC in the last 72 hours.      No lab exists for component: TGL, HDLC,  HBA1C         Current meds:      Current Facility-Administered Medications:    ?  NOREPINephrine (LEVOPHED) 16 mg in 0.9% sodium chloride 250 mL infusion, 0.5-30 mcg/min, IntraVENous, TITRATE, [name], MD, Last Rate: 3 mL/hr at 05/18/21 0829, 3.2 mcg/min at 05/18/21 0829   ?  vasopressin (VASOSTRICT) 20 Units in 0.9% sodium chloride 100 mL infusion, 0-0.03 Units/min, IntraVENous, TITRATE, Drucie Opitz, MD, Held at 05/17/21 1747   ?  methylPREDNISolone (PF) (SOLU-MEDROL) injection 40 mg, 40 mg, IntraVENous, Q12H, Drucie Opitz, MD, 40 mg at 05/17/21 2104   ?   albuterol-ipratropium (DUO-NEB) 2.5 MG-0.5 MG/3 ML, 3 mL, Nebulization, Q4H RT, Drucie Opitz, MD, 3 mL at 05/18/21 1610   ?  bumetanide (BUMEX) tablet 0.5 mg, 0.5 mg, Oral, BID, Drucie Opitz, MD, 0.5 mg at 05/17/21 1746   ?  metoprolol succinate (TOPROL-XL) XL tablet 25 mg, 25 mg, Oral, DAILY, Monserat Prestigiacomo, Georga Hacking, NP, 25 mg at 05/16/21 0847   ?  acetaZOLAMIDE (DIAMOX) tablet 250 mg, 250 mg, Oral, BID, Melynda Keller, MD, 250 mg at 05/17/21 1746   ?  nystatin (MYCOSTATIN) 100,000 unit/gram cream, , Topical, BID, Marca Ancona C, NP, Given at 05/17/21 1044   ?  traZODone (DESYREL) tablet 100 mg, 100 mg, Oral, QHS, Horald Chestnut, DO, 100 mg at 05/17/21 2104   ?  oxyCODONE IR (ROXICODONE) tablet 5 mg, 5 mg, Oral, Q6H PRN, Horald Chestnut, DO, 5 mg at 05/18/21 0036   ?  albuterol-ipratropium (DUO-NEB) 2.5 MG-0.5 MG/3 ML, 3 mL, Nebulization, Q4H PRN, Horald Chestnut, DO, 3 mL at 05/15/21 2003   ?  [Held by provider] heparin (porcine) injection 5,000 Units, 5,000 Units, SubCUTAneous, Q8H, Horald Chestnut, DO, 5,000 Units at 05/18/21 9604   ?  insulin lispro (HUMALOG) injection, , SubCUTAneous, AC&HS, Horald Chestnut, DO, 2 Units at 05/18/21 5409   ?  glucose  chewable tablet 16 g, 4 Tablet, Oral, PRN, Horald Chestnut, DO   ?  glucagon (GLUCAGEN) injection 1 mg, 1 mg, IntraMUSCular, PRN, Jamil, Arna Medici, DO   ?  dextrose 10% infusion 0-250 mL, 0-250 mL, IntraVENous, PRN, Horald Chestnut, DO   ?  [Held by provider] arformoteroL (BROVANA) neb solution 15 mcg, 15 mcg, Nebulization, BID RT, Jamil, Arna Medici, DO, 15 mcg at 05/17/21 0745   ?  budesonide (PULMICORT) 500 mcg/2 ml nebulizer suspension, 500 mcg, Nebulization, BID RT, Horald Chestnut, DO, 500 mcg at 05/18/21 1062   ?  sodium chloride (NS) flush 5-40 mL, 5-40 mL, IntraVENous, Q8H, Melynda Keller, MD, 10 mL at 05/18/21 0641   ?  sodium chloride (NS) flush 5-40 mL, 5-40 mL, IntraVENous, PRN, Melynda Keller, MD   ?  acetaminophen (TYLENOL) tablet 650 mg, 650 mg, Oral, Q6H PRN, 650 mg at 05/17/21 0352  **OR** acetaminophen (TYLENOL) suppository 650 mg, 650 mg, Rectal, Q6H PRN, Melynda Keller, MD   ?  polyethylene glycol (MIRALAX) packet 17 g, 17 g, Oral, DAILY PRN, Melynda Keller, MD   ?  senna (SENOKOT) tablet 8.6 mg, 1 Tablet, Oral, DAILY PRN, Melynda Keller, MD   ?  promethazine (PHENERGAN) tablet 12.5 mg, 12.5 mg, Oral, Q6H PRN **OR** ondansetron (ZOFRAN) injection 4 mg, 4 mg, IntraVENous, Q6H PRN, Melynda Keller, MD, 4 mg at 05/17/21 0956   ?  cefTRIAXone (ROCEPHIN) 2 g in 0.9% sodium chloride 20 mL IV syringe, 2 g, IntraVENous, Q24H, Melynda Keller, MD, 2 g at 05/18/21 6948   ?  aspirin chewable tablet 81 mg, 81 mg, Oral, DAILY, Melynda Keller, MD, 81 mg at 05/17/21 0805   ?  atorvastatin (LIPITOR) tablet 10 mg, 10 mg, Oral, DAILY, Melynda Keller, MD, 10 mg at 05/17/21 0805   ?  citalopram (CELEXA) tablet 40 mg, 40 mg, Oral, DAILY, Melynda Keller, MD, 40 mg at 05/17/21 0805   ?  [Held by provider] glipiZIDE (GLUCOTROL) tablet 5 mg, 5 mg, Oral, BID WITH MEALS, Melynda Keller, MD, 5 mg at 05/17/21 0805   ?  levothyroxine (SYNTHROID) tablet 150 mcg, 150 mcg, Oral, ACB, Melynda Keller, MD, 150 mcg at 05/17/21 0557   ?  montelukast (SINGULAIR) tablet 10 mg, 10 mg, Oral, QHS, Melynda Keller, MD, 10 mg at 05/17/21 2104   ?  sucralfate (CARAFATE) tablet 1 g, 1 g, Oral, TID, Melynda Keller, MD, 1 g at 05/17/21 2104      Sharla Kidney, NP      [facility] Cardiology   Call center: (P) [phone]  (F) [phone]         CC:Barrett, Lance Sell, NP

## 2021-05-18 NOTE — Progress Notes (Signed)
Progress Notes by [name], MD at 05/18/21 1503                Author: [name], MD  Service: Internal Medicine  Author Type: Physician       Filed: 05/18/21 1510  Date of Service: 05/18/21 1503  Status: Signed          Editor: [name], MD (Physician)                  [facility]   [address] [place], [place]  [address]   [phone]          Code Stroke Rapid Response Note            NAME:  Deborah [name]   DOB:  1955/09/03   MRN:  [id number]   PCP:   [name], NP   Date of Service:  05/18/2021      Code: Full Code      Consultant(s): IP CONSULT TO CARDIOLOGY   IP CONSULT TO INTENSIVIST        Reason for visit:     Code Stroke called at 3:03 PM.        Subjective:     Ms. Sterba had a an unknown onset of anisocoria with right IM 9 mm compared to the left at 4 mm.  Not noted on nurses exam earlier this morning..  The neurological deficits have improved.    Blurry vision which is improving .  Patient is right handed.  Otherwise no complaints and actually appears to be much better than she was over the past couple days.   Glucose was 169.            Lab Results         Component  Value  Date/Time            Glucose  177 (H)  05/18/2021 04:17 AM            Glucose (POC)  169 (H)  05/18/2021 02:15 PM             Objective:     Visit Vitals      BP  95/63 (BP 1 Location: Left lower arm, BP Patient Position: Sitting)     Pulse  87     Temp  97.3 F (36.3 C)     Resp  14     Ht  5\' 7"  (1.702 m)     Wt  (!) 160.7 kg (354 lb 4.5 oz)     SpO2  100%        BMI  55.49 kg/m        General: Alert, cooperative, NAD   Eyes: Pupils reactive but sluggish bilaterally with 9 mm pupil on the right and 4 mm on the left.   ENT: Lips, mucosa, and tongue normal.    Lungs: CTA with good BS and normal effort   Heart: S1-S2, RRR   Abd: SNTBS(+), No HSM   Ext: no cyanosis, bilateral 3+ lower extremity edema   Neuro: Alert and oriented X 3, normal strength and tone.  Normal symmetric reflexes. Normal coordination   Psych: Not anxious, cooperative, normal affect      Labs and Xrays:     Lab Results         Component  Value  Date/Time            WBC  5.1  05/18/2021 04:17 AM       HGB  9.2 (L)  05/18/2021 04:17 AM       HCT  31.1 (L)  05/18/2021 04:17 AM       PLATELET  68 (L)  05/18/2021 04:17 AM            MCV  94.5  05/18/2021 04:17 AM          Lab Results         Component  Value  Date/Time            Sodium  135 (L)  05/18/2021 04:17 AM       Potassium  4.4  05/18/2021 04:17 AM       Chloride  96 (L)  05/18/2021 04:17 AM       CO2  38 (H)  05/18/2021 04:17 AM       Anion gap  1 (L)  05/18/2021 04:17 AM       Glucose  177 (H)  05/18/2021 04:17 AM       BUN  32 (H)  05/18/2021 04:17 AM       Creatinine  1.26 (H)  05/18/2021 04:17 AM       BUN/Creatinine ratio  25 (H)  05/18/2021 04:17 AM       GFR est AA  56 (L)  08/26/2020 04:25 PM       GFR est non-AA  46 (L)  08/26/2020 04:25 PM            Calcium  9.2  05/18/2021 04:17 AM             CT CODE NEURO HEAD WO CONTRAST      Result Date: 05/18/2021   No acute intracranial findings.              Assessment & Plan:     Anisocoria right eye 9 mm and left eye 4 mm      Code Stroke for anisocoria:     Teleneurologist recommends:  No further workup as he feels this is due to bronchodilator treatments     CT unremarkable CT results pending.     If anisocoria does not improve within the next 6 hours consider MRI but patient has pacemaker noncompatible.  We will rely on CTA.               _____________________________   [name], MD

## 2021-05-18 NOTE — Progress Notes (Signed)
Progress  Notes by Alda Berthold at 05/18/21 1048                Author: Austin Miles H  Service: --  Author Type: Pastoral Care       Filed: 05/18/21 1049  Date of Service: 05/18/21 1048  Status: Signed          Editor: Alda Berthold (Pastoral Care)               Spiritual Care Partner Volunteer visited patient at [facility]. [facility] on 05/18/2021   Documented by:   Chaplain Burgess Estelle, MS, MDiv, [facility]   Staff Chaplain   Paging service: [phone] (PRAY)

## 2021-05-19 LAB — CBC WITH AUTOMATED DIFF
ABS. BASOPHILS: 0 10*3/uL (ref 0.0–0.1)
ABS. BASOPHILS: 0 10*3/uL (ref 0.0–0.1)
ABS. EOSINOPHILS: 0 10*3/uL (ref 0.0–0.4)
ABS. EOSINOPHILS: 0 10*3/uL (ref 0.0–0.4)
ABS. IMM. GRANS.: 0 10*3/uL (ref 0.00–0.04)
ABS. IMM. GRANS.: 0.1 10*3/uL — ABNORMAL HIGH (ref 0.00–0.04)
ABS. LYMPHOCYTES: 0.3 10*3/uL — ABNORMAL LOW (ref 0.8–3.5)
ABS. LYMPHOCYTES: 0.4 10*3/uL — ABNORMAL LOW (ref 0.8–3.5)
ABS. MONOCYTES: 0.2 10*3/uL (ref 0.0–1.0)
ABS. MONOCYTES: 0.3 10*3/uL (ref 0.0–1.0)
ABS. NEUTROPHILS: 2.3 10*3/uL (ref 1.8–8.0)
ABS. NEUTROPHILS: 7.9 10*3/uL (ref 1.8–8.0)
ABSOLUTE NRBC: 0 10*3/uL (ref 0.00–0.01)
ABSOLUTE NRBC: 0 10*3/uL (ref 0.00–0.01)
BASOPHILS: 0 % (ref 0–1)
BASOPHILS: 0 % (ref 0–1)
EOSINOPHILS: 0 % (ref 0–7)
EOSINOPHILS: 0 % (ref 0–7)
HCT: 27.4 % — ABNORMAL LOW (ref 35.0–47.0)
HCT: 28.6 % — ABNORMAL LOW (ref 35.0–47.0)
HGB: 8.2 g/dL — ABNORMAL LOW (ref 11.5–16.0)
HGB: 8.6 g/dL — ABNORMAL LOW (ref 11.5–16.0)
IMMATURE GRANULOCYTES: 0 % (ref 0.0–0.5)
IMMATURE GRANULOCYTES: 1 % — ABNORMAL HIGH (ref 0.0–0.5)
LYMPHOCYTES: 12 % (ref 12–49)
LYMPHOCYTES: 5 % — ABNORMAL LOW (ref 12–49)
MCH: 28.1 PG (ref 26.0–34.0)
MCH: 28.1 PG (ref 26.0–34.0)
MCHC: 29.9 g/dL — ABNORMAL LOW (ref 30.0–36.5)
MCHC: 30.1 g/dL (ref 30.0–36.5)
MCV: 93.5 FL (ref 80.0–99.0)
MCV: 93.8 FL (ref 80.0–99.0)
MONOCYTES: 3 % — ABNORMAL LOW (ref 5–13)
MONOCYTES: 7 % (ref 5–13)
MPV: 11.3 FL (ref 8.9–12.9)
MPV: 11.6 FL (ref 8.9–12.9)
NEUTROPHILS: 81 % — ABNORMAL HIGH (ref 32–75)
NEUTROPHILS: 91 % — ABNORMAL HIGH (ref 32–75)
NRBC: 0 PER 100 WBC
NRBC: 0 PER 100 WBC
PLATELET: 62 10*3/uL — ABNORMAL LOW (ref 150–400)
PLATELET: 97 10*3/uL — ABNORMAL LOW (ref 150–400)
RBC: 2.92 M/uL — ABNORMAL LOW (ref 3.80–5.20)
RDW: 15.9 % — ABNORMAL HIGH (ref 11.5–14.5)
RDW: 16.2 % — ABNORMAL HIGH (ref 11.5–14.5)
WBC: 2.8 10*3/uL — ABNORMAL LOW (ref 3.6–11.0)
WBC: 8.7 10*3/uL (ref 3.6–11.0)

## 2021-05-19 LAB — EKG, 12 LEAD, INITIAL
Atrial Rate: 57 {beats}/min
Calculated R Axis: -73 degrees
Calculated T Axis: 108 degrees
Q-T Interval: 392 ms
QRS Duration: 166 ms
QTC Calculation (Bezet): 561 ms
Ventricular Rate: 123 {beats}/min

## 2021-05-19 LAB — METABOLIC PANEL, BASIC
BUN: 37 MG/DL — ABNORMAL HIGH (ref 6–20)
CO2: 38 mmol/L — ABNORMAL HIGH (ref 21–32)
Calcium: 9.2 MG/DL (ref 8.5–10.1)
Chloride: 95 mmol/L — ABNORMAL LOW (ref 97–108)
Creatinine: 1.33 MG/DL — ABNORMAL HIGH (ref 0.55–1.02)
Glucose: 190 mg/dL — ABNORMAL HIGH (ref 65–100)
Potassium: 4 mmol/L (ref 3.5–5.1)
Sodium: 134 mmol/L — ABNORMAL LOW (ref 136–145)
eGFR: 45 mL/min/{1.73_m2} — ABNORMAL LOW (ref >60)

## 2021-05-19 LAB — GLUCOSE, POC
Glucose (POC): 204 mg/dL — ABNORMAL HIGH (ref 65–117)
Glucose (POC): 222 mg/dL — ABNORMAL HIGH (ref 65–117)
Glucose (POC): 245 mg/dL — ABNORMAL HIGH (ref 65–117)
Glucose (POC): 200 mg/dL — ABNORMAL HIGH (ref 65–117)

## 2021-05-19 LAB — CBC WITH AUTO DIFFERENTIAL
Basophils %: 0 % (ref 0–1)
Basophils %: 0 % (ref 0–1)
Basophils Absolute: 0 10*3/uL (ref 0.0–0.1)
Basophils Absolute: 0 10*3/uL (ref 0.0–0.1)
Eosinophils %: 0 % (ref 0–7)
Eosinophils %: 0 % (ref 0–7)
Eosinophils Absolute: 0 10*3/uL (ref 0.0–0.4)
Eosinophils Absolute: 0 10*3/uL (ref 0.0–0.4)
Granulocyte Absolute Count: 0 10*3/uL (ref 0.00–0.04)
Granulocyte Absolute Count: 0.1 10*3/uL — ABNORMAL HIGH (ref 0.00–0.04)
Hematocrit: 27.4 % — ABNORMAL LOW (ref 35.0–47.0)
Hematocrit: 28.6 % — ABNORMAL LOW (ref 35.0–47.0)
Hemoglobin: 8.2 g/dL — ABNORMAL LOW (ref 11.5–16.0)
Hemoglobin: 8.6 g/dL — ABNORMAL LOW (ref 11.5–16.0)
Immature Granulocytes: 0 % (ref 0.0–0.5)
Immature Granulocytes: 1 % — ABNORMAL HIGH (ref 0.0–0.5)
Lymphocytes %: 12 % (ref 12–49)
Lymphocytes %: 5 % — ABNORMAL LOW (ref 12–49)
Lymphocytes Absolute: 0.3 10*3/uL — ABNORMAL LOW (ref 0.8–3.5)
Lymphocytes Absolute: 0.4 10*3/uL — ABNORMAL LOW (ref 0.8–3.5)
MCH: 28.1 PG (ref 26.0–34.0)
MCH: 28.1 PG (ref 26.0–34.0)
MCHC: 29.9 g/dL — ABNORMAL LOW (ref 30.0–36.5)
MCHC: 30.1 g/dL (ref 30.0–36.5)
MCV: 93.5 FL (ref 80.0–99.0)
MCV: 93.8 FL (ref 80.0–99.0)
MPV: 11.3 FL (ref 8.9–12.9)
MPV: 11.6 FL (ref 8.9–12.9)
Monocytes %: 3 % — ABNORMAL LOW (ref 5–13)
Monocytes %: 7 % (ref 5–13)
Monocytes Absolute: 0.2 10*3/uL (ref 0.0–1.0)
Monocytes Absolute: 0.3 10*3/uL (ref 0.0–1.0)
NRBC Absolute: 0 10*3/uL (ref 0.00–0.01)
NRBC Absolute: 0 10*3/uL (ref 0.00–0.01)
Neutrophils %: 81 % — ABNORMAL HIGH (ref 32–75)
Neutrophils %: 91 % — ABNORMAL HIGH (ref 32–75)
Neutrophils Absolute: 2.3 10*3/uL (ref 1.8–8.0)
Neutrophils Absolute: 7.9 10*3/uL (ref 1.8–8.0)
Nucleated RBCs: 0 PER 100 WBC
Nucleated RBCs: 0 PER 100 WBC
Platelets: 62 10*3/uL — ABNORMAL LOW (ref 150–400)
Platelets: 97 10*3/uL — ABNORMAL LOW (ref 150–400)
RBC: 2.92 M/uL — ABNORMAL LOW (ref 3.80–5.20)
RBC: 3.06 M/uL — ABNORMAL LOW (ref 3.80–5.20)
RDW: 15.9 % — ABNORMAL HIGH (ref 11.5–14.5)
RDW: 16.2 % — ABNORMAL HIGH (ref 11.5–14.5)
WBC: 2.8 10*3/uL — ABNORMAL LOW (ref 3.6–11.0)
WBC: 8.7 10*3/uL (ref 3.6–11.0)

## 2021-05-19 LAB — BASIC METABOLIC PANEL
Anion Gap: 1 mmol/L — ABNORMAL LOW (ref 5–15)
BUN: 37 MG/DL — ABNORMAL HIGH (ref 6–20)
Bun/Cre Ratio: 28 — ABNORMAL HIGH (ref 12–20)
CO2: 38 mmol/L — ABNORMAL HIGH (ref 21–32)
Calcium: 9.2 MG/DL (ref 8.5–10.1)
Chloride: 95 mmol/L — ABNORMAL LOW (ref 97–108)
Creatinine: 1.33 MG/DL — ABNORMAL HIGH (ref 0.55–1.02)
ESTIMATED GLOMERULAR FILTRATION RATE: 45 mL/min/{1.73_m2} — ABNORMAL LOW (ref >60)
Glucose: 190 mg/dL — ABNORMAL HIGH (ref 65–100)
Potassium: 4 mmol/L (ref 3.5–5.1)
Sodium: 134 mmol/L — ABNORMAL LOW (ref 136–145)

## 2021-05-19 LAB — POCT GLUCOSE
POC Glucose: 204 mg/dL — ABNORMAL HIGH (ref 65–117)
POC Glucose: 222 mg/dL — ABNORMAL HIGH (ref 65–117)
POC Glucose: 245 mg/dL — ABNORMAL HIGH (ref 65–117)
POC Glucose: 200 mg/dL — ABNORMAL HIGH (ref 65–117)

## 2021-05-19 LAB — EKG 12-LEAD
Atrial Rate: 57 {beats}/min
Q-T Interval: 392 ms
QRS Duration: 166 ms
QTc Calculation (Bazett): 561 ms
R Axis: -73 degrees
T Axis: 108 degrees
Ventricular Rate: 123 {beats}/min

## 2021-05-19 MED ORDER — IPRATROPIUM-ALBUTEROL 2.5 MG-0.5 MG/3 ML NEB SOLUTION
2.5 mg-0.5 mg/3 ml | Freq: Four times a day (QID) | RESPIRATORY_TRACT | Status: AC
Start: 2021-05-19 — End: 2021-05-29
  Administered 2021-05-19 – 2021-05-29 (×39): via RESPIRATORY_TRACT

## 2021-05-19 MED ORDER — PREDNISONE 5 MG TAB
5 mg | Freq: Every day | ORAL | Status: AC
Start: 2021-05-19 — End: 2021-05-21
  Administered 2021-05-19 – 2021-05-21 (×3): via ORAL

## 2021-05-19 MED ORDER — VIAL2BAG ADAPTOR (20 MM)
10 mg/mL | Status: AC
Start: 2021-05-19 — End: 2021-05-23
  Administered 2021-05-19 – 2021-05-23 (×54): via INTRAVENOUS

## 2021-05-19 MED ORDER — BUMETANIDE 1 MG TAB
1 mg | Freq: Two times a day (BID) | ORAL | Status: AC
Start: 2021-05-19 — End: 2021-05-21
  Administered 2021-05-19 – 2021-05-21 (×4): via ORAL

## 2021-05-19 MED FILL — CARAFATE 1 GRAM TABLET: 1 gram | ORAL | Qty: 1

## 2021-05-19 MED FILL — IPRATROPIUM-ALBUTEROL 2.5 MG-0.5 MG/3 ML NEB SOLUTION: 2.5 mg-0.5 mg/3 ml | RESPIRATORY_TRACT | Qty: 3

## 2021-05-19 MED FILL — PREDNISONE 5 MG TAB: 5 mg | ORAL | Qty: 1

## 2021-05-19 MED FILL — BUDESONIDE 0.5 MG/2 ML NEB SUSPENSION: 0.5 mg/2 mL | RESPIRATORY_TRACT | Qty: 1

## 2021-05-19 MED FILL — INSULIN LISPRO 100 UNIT/ML INJECTION: 100 unit/mL | SUBCUTANEOUS | Qty: 3

## 2021-05-19 MED FILL — CEFTRIAXONE 2 GRAM SOLUTION FOR INJECTION: 2 gram | INTRAMUSCULAR | Qty: 2

## 2021-05-19 MED FILL — INSULIN LISPRO 100 UNIT/ML INJECTION: 100 unit/mL | SUBCUTANEOUS | Qty: 1

## 2021-05-19 MED FILL — TRAZODONE 100 MG TAB: 100 mg | ORAL | Qty: 1

## 2021-05-19 MED FILL — CITALOPRAM 20 MG TAB: 20 mg | ORAL | Qty: 2

## 2021-05-19 MED FILL — ATORVASTATIN 10 MG TAB: 10 mg | ORAL | Qty: 1

## 2021-05-19 MED FILL — SENOKOT 8.6 MG TABLET: 8.6 mg | ORAL | Qty: 1

## 2021-05-19 MED FILL — ACETAZOLAMIDE 250 MG TAB: 250 mg | ORAL | Qty: 1

## 2021-05-19 MED FILL — BUMETANIDE 1 MG TAB: 1 mg | ORAL | Qty: 1

## 2021-05-19 MED FILL — PANTOPRAZOLE 40 MG TAB, DELAYED RELEASE: 40 mg | ORAL | Qty: 1

## 2021-05-19 MED FILL — VAZCULEP 10 MG/ML INJECTION SOLUTION: 10 mg/mL | INTRAMUSCULAR | Qty: 5

## 2021-05-19 MED FILL — HEPARIN (PORCINE) 5,000 UNIT/ML IJ SOLN: 5000 unit/mL | INTRAMUSCULAR | Qty: 1

## 2021-05-19 MED FILL — LEVOTHYROXINE 75 MCG TAB: 75 mcg | ORAL | Qty: 2

## 2021-05-19 MED FILL — SOLU-MEDROL (PF) 40 MG/ML SOLUTION FOR INJECTION: 40 mg/mL | INTRAMUSCULAR | Qty: 1

## 2021-05-19 MED FILL — MONTELUKAST 10 MG TAB: 10 mg | ORAL | Qty: 1

## 2021-05-19 MED FILL — ASPIRIN 81 MG CHEWABLE TAB: 81 mg | ORAL | Qty: 1

## 2021-05-19 NOTE — Progress Notes (Signed)
Progress  Notes by Truitt Leep, RN at 05/19/21 [phone]                Author: Truitt Leep, RN  Service: --  Author Type: Registered Nurse       Filed: 05/19/21 0311  Date of Service: 05/19/21 0311  Status: Signed          Editor: Truitt Leep, RN (Registered Nurse)                  Problem: Pressure Injury - Risk of   Goal: *Prevention of pressure injury   Description: Document Braden Scale and appropriate interventions in the flowsheet.   Outcome: Progressing Towards Goal   Note: Pressure Injury Interventions:   Sensory Interventions: Assess changes in LOC, Assess need for specialty bed, Avoid rigorous massage over bony prominences, Float heels, Keep linens dry and wrinkle-free, Minimize linen layers, Pressure  redistribution bed/mattress (bed type), Turn and reposition approx. every two hours (pillows and wedges if needed)      Moisture Interventions: Absorbent underpads, Apply protective barrier, creams and emollients, Internal/External urinary devices      Activity Interventions: Assess need for specialty bed, Pressure redistribution bed/mattress(bed type), PT/OT evaluation      Mobility Interventions: Assess need for specialty bed, Float heels, HOB 30 degrees or less, Pressure redistribution bed/mattress (bed type)      Nutrition Interventions: Document food/fluid/supplement intake      Friction and Shear Interventions: Apply protective barrier, creams and emollients, HOB 30 degrees or less, Minimize layers                       Problem: Patient Education: Go to Patient Education Activity   Goal: Patient/Family Education   Outcome: Progressing Towards Goal       Problem: Falls - Risk of   Goal: *Absence of Falls   Description: Document Schmid Fall Risk and appropriate interventions in the flowsheet.   Outcome: Progressing Towards Goal   Note: Fall Risk Interventions:   Mobility Interventions: Assess mobility with egress test             Medication Interventions: Assess postural VS orthostatic  hypotension, Bed/chair exit alarm      Elimination Interventions: Bed/chair exit alarm      History of Falls Interventions: Consult care management for discharge planning             Problem: Patient Education: Go to Patient Education Activity   Goal: Patient/Family Education   Outcome: Progressing Towards Goal       Problem: Breathing Pattern - Ineffective   Goal: *Absence of hypoxia   Outcome: Progressing Towards Goal   Goal: *Use of effective breathing techniques   Outcome: Progressing Towards Goal       Problem: Patient Education: Go to Patient Education Activity   Goal: Patient/Family Education   Outcome: Progressing Towards Goal       Problem: TIA/CVA Stroke: 0-24 hours   Goal: Off Pathway (Use only if patient is Off Pathway)   Outcome: Progressing Towards Goal   Goal: Activity/Safety   Outcome: Progressing Towards Goal   Goal: Consults, if ordered   Outcome: Progressing Towards Goal   Goal: Diagnostic Test/Procedures   Outcome: Progressing Towards Goal   Goal: Nutrition/Diet   Outcome: Progressing Towards Goal   Goal: Discharge Planning   Outcome: Progressing Towards Goal   Goal: Medications   Outcome: Progressing Towards Goal   Goal: Respiratory   Outcome: Progressing  Towards Goal   Goal: Treatments/Interventions/Procedures   Outcome: Progressing Towards Goal   Goal: Minimize risk of bleeding post-thrombolytic infusion   Outcome: Progressing Towards Goal   Goal: Monitor for complications post-thrombolytic infusion   Outcome: Progressing Towards Goal   Goal: Psychosocial   Outcome: Progressing Towards Goal   Goal: *Hemodynamically stable   Outcome: Progressing Towards Goal   Goal: *Neurologically stable   Description: Absence of additional neurological deficits     Outcome: Progressing Towards Goal   Goal: *Verbalizes anxiety and depression are reduced or absent   Outcome: Progressing Towards Goal   Goal: *Absence of Signs of Aspiration on Current Diet   Outcome: Progressing Towards Goal   Goal: *Absence of  deep venous thrombosis signs and symptoms(Stroke Metric)   Outcome: Progressing Towards Goal   Goal: *Ability to perform ADLs and demonstrates progressive mobility and function   Outcome: Progressing Towards Goal   Goal: *Stroke education started(Stroke Metric)   Outcome: Progressing Towards Goal   Goal: *Dysphagia screen performed(Stroke Metric)   Outcome: Progressing Towards Goal   Goal: *Rehab consulted(Stroke Metric)   Outcome: Progressing Towards Goal       Problem: TIA/CVA Stroke: Day 2 Until Discharge   Goal: Off Pathway (Use only if patient is Off Pathway)   Outcome: Progressing Towards Goal   Goal: Activity/Safety   Outcome: Progressing Towards Goal   Goal: Diagnostic Test/Procedures   Outcome: Progressing Towards Goal   Goal: Nutrition/Diet   Outcome: Progressing Towards Goal   Goal: Discharge Planning   Outcome: Progressing Towards Goal   Goal: Medications   Outcome: Progressing Towards Goal   Goal: Respiratory   Outcome: Progressing Towards Goal   Goal: Treatments/Interventions/Procedures   Outcome: Progressing Towards Goal   Goal: Psychosocial   Outcome: Progressing Towards Goal   Goal: *Verbalizes anxiety and depression are reduced or absent   Outcome: Progressing Towards Goal   Goal: *Absence of aspiration   Outcome: Progressing Towards Goal   Goal: *Absence of deep venous thrombosis signs and symptoms(Stroke Metric)   Outcome: Progressing Towards Goal   Goal: *Optimal pain control at patient's stated goal   Outcome: Progressing Towards Goal   Goal: *Tolerating diet   Outcome: Progressing Towards Goal   Goal: *Ability to perform ADLs and demonstrates progressive mobility and function   Outcome: Progressing Towards Goal   Goal: *Stroke education continued(Stroke Metric)   Outcome: Progressing Towards Goal       Problem: Ischemic Stroke: Discharge Outcomes   Goal: *Verbalizes anxiety and depression are reduced or absent   Outcome: Progressing Towards Goal   Goal: *Verbalize understanding of risk factor  modification(Stroke Metric)   Outcome: Progressing Towards Goal   Goal: *Hemodynamically stable   Outcome: Progressing Towards Goal   Goal: *Absence of aspiration pneumonia   Outcome: Progressing Towards Goal   Goal: *Aware of needed dietary changes   Outcome: Progressing Towards Goal   Goal: *Verbalize understanding of prescribed medications including anti-coagulants, anti-lipid, and/or anti-platelets(Stroke  Metric)   Outcome: Progressing Towards Goal   Goal: *Tolerating diet   Outcome: Progressing Towards Goal   Goal: *Aware of follow-up diagnostics related to anticoagulants   Outcome: Progressing Towards Goal   Goal: *Ability to perform ADLs and demonstrates progressive mobility and function   Outcome: Progressing Towards Goal   Goal: *Absence of DVT(Stroke Metric)   Outcome: Progressing Towards Goal   Goal: *Absence of aspiration   Outcome: Progressing Towards Goal   Goal: *Optimal pain control at patient's stated goal   Outcome:  Progressing Towards Goal   Goal: *Home safety concerns addressed   Outcome: Progressing Towards Goal   Goal: *Describes available resources and support systems   Outcome: Progressing Towards Goal   Goal: *Verbalizes understanding of activation of EMS(911) for stroke symptoms(Stroke Metric)   Outcome: Progressing Towards Goal   Goal: *Understands and describes signs and symptoms to report to providers(Stroke Metric)   Outcome: Progressing Towards Goal   Goal: *Neurolgocially stable (absence of additional neurological deficits)   Outcome: Progressing Towards Goal   Goal: *Verbalizes importance of follow-up with primary care physician(Stroke Metric)   Outcome: Progressing Towards Goal   Goal: *Smoking cessation discussed,if applicable(Stroke Metric)   Outcome: Progressing Towards Goal   Goal: *Depression screening completed(Stroke Metric)   Outcome: Progressing Towards Goal       Problem: Patient Education: Go to Patient Education Activity   Goal: Patient/Family Education   Outcome:  Progressing Towards Goal

## 2021-05-19 NOTE — Progress Notes (Signed)
Progress Notes by Burton Apley, RN at 05/19/21 2138                Author: Burton Apley, RN  Service: --  Author Type: Registered Nurse       Filed: 05/20/21 0725  Date of Service: 05/19/21 2138  Status: Addendum          Editor: Burton Apley, RN (Registered Nurse)          Related Notes: Original Note by Burton Apley, RN (Registered Nurse) filed at 05/20/21 [phone]               2135   TRANSFER - IN REPORT:      Verbal report received from Heather, RN(name) on [name]  being received from ICU(unit) for routine progression of care        Report consisted of patients Situation, Background, Assessment and    Recommendations(SBAR).       Information from the following report(s) SBAR, Kardex, Intake/Output, MAR, Recent Results, and Cardiac Rhythm apaced  was reviewed with the receiving nurse.      Opportunity for questions and clarification was provided.        Assessment completed upon patients arrival to unit and care assumed.       0020: patient blood pressure low, 82/61, 82/43, multiple cuffs and positions used. Neo stopped yesterday 4/22 at 0946. Patient also had 55 beat run of vtach according to monitor tech, is apaced on the monitor, vpaced on demand. Notified NP, Marca Ancona.  Informed NP that cardiology was being paged and asked if NP would like neo restarted.       3817: per NP, restart neo, runs of vtach not new for patient.      0130: spoke with on-call cardiologist. Per MD, not vtach, check electrolytes.       7116: platelets 85 this morning, previously 97 yesterday. Reached out to NP on whether to give or hold morning dose of heparin. Per NP, give 0700 dose.       Central line Type: PICC single lumen   Central Line Insert Date: 04/13/21   Reason Central Line Placed: long term antibiotics   Central Line Dressing Date: 05/15/21   Biopatch in place? Yes No: yes   Tubing labeled and appropriate? Yes No: yes   Alcohol caps on all open ports? Yes No: yes   Last CHG bath (time&date):  05/19/21 @ 2230   Reviewed with provider and central line must stay in for the following reasons: antibiotics      This patient was assisted with Intentional Toileting every 2 hours during this shift as appropriate.  Documentation of ambulation and output reflected on Flowsheet as appropriate.   Purposeful hourly rounding was completed using AIDET and 5Ps.  Outcomes of PHR documented as they occurred. Bed alarm in use as appropriate.  Dual Suction and ambubag in place.       0700   Bedside and Verbal shift change report given to Mardene Celeste, Charity fundraiser (oncoming nurse) by Leotis Shames, RN (offgoing nurse). Report included the following information SBAR, Kardex, Intake/Output, MAR, Recent Results,  and Cardiac Rhythm apaced .

## 2021-05-19 NOTE — Progress Notes (Signed)
Progress Notes by Purnell ShoemakerBammes, Angelina Neece O, DO at 05/19/21 [id number]                Author: Purnell ShoemakerBammes, Sorina Derrig O, DO  Service: Critical Care Medicine  Author Type: Physician       Filed: 05/19/21 0933  Date of Service: 05/19/21 0927  Status: Signed          Editor: Purnell ShoemakerBammes, Kelse Ploch O, DO (Physician)                       [facility]      ICU TEAM Progress Note         Name:  Deborah LoftDonna [name]        DOB:  [birth year]     MRN:  [id number]        Date:  05/19/2021                 ICU Assessment        1.  Septic shock   2.  GB strep bacteremia   3.  Acute on chronic HFpEF   4.  Acute on chronic respiratory failure   5.  AECOPD   6.  Hypothyroidism   7.  Anemia   8.  Anxiety/depression              ICU Comprehensive Plan of Care:     -Follow up urine cx   -Continue rocephin for now   -Increase bumex to 1 mg PO BID   -Wean neo to keep MAP>65   [facility]   -Change solumedrol to prednisone      1.  Discussed Care Plan with Bedside RN      2.  Documentation of Current Medications      ICU Issues:   D- Delirium assessement CAM-ICU: Assessments ordered   E- Early Mobility/ PT: Will order when appropriate   F- Feeding: +diet   Peptic Ulcer Disease Prophylaxis: PPI   [facility]   Glycemic Control (140-180 mg/dL): SSI   Catheter Discontinuation (CVC, arterial, urinary, gastric, rectal): Keep all   Antibiotics: rocephin   Steroids: Prednisone   MAR Review (pain, anxiety, constipation . Marland Kitchen. Marland Kitchen.): Completed   Code Status: FULL        Subjective:     Progress Note: 05/19/2021        Reason for ICU Admission: septic shock       HPI:   66 yo F w/ h/o HFpEF, s/p AVR, s/p pacemaker, COPD, CKD3, DM2, GERD, Hypothyroidism, OSA, depression, that p/w sob and b/l leg swelling.  [facility]. History is limited to chart review. Pt notes PTA that she has not been receiving her bumex medication on a consistent basis. In the ED on 4/12 pt was admitted for acute on chronic CHF, and  started on bumex  IV. Pt also was found to have bacteremia w/ groupB strep started on ceftriaxone. Pt also had noted acute on chronic resp failure w/ higher O2 requirements than her baseline.        Today a RRT was called as pt was noted to be hypotensive and dizzy. SBP was 86. Pt also had noted low grade fever this AM. Pt was transferred to the ICU for further management. Pt notes that she was  feeling better in terms of her sob initially since being admitted, and then in the last few days her sob sx's feels worse. Pt notes she  never used O2 at home in past, and notes she last used her CPAP at home for OSA. Pt has fevers, chills this AM. Pt  has some nausea. Denies ay HA, vomiting, CP, abd pain. Pt had a BM a few days ago. Pt got 2 doses of albumin.       Overnight Events:    05/19/2021   NAE overnight.  Neo at 10 this AM.  On 4L NC.  +diet.  Good UOP.      POD:   * No surgery found *      S/P:            Active Problem List:           Problem List   Date Reviewed: 03/09/2021                          Codes  Class            * (Principal) Acute on chronic heart failure with preserved ejection fraction (HFpEF) (HCC)  ICD-10-CM: I50.33   ICD-9-CM: 428.23                        Bacteremia due to group B Streptococcus  ICD-10-CM: R78.81, B95.1   ICD-9-CM: 790.7, 041.02                        Chronic respiratory failure with hypoxia (HCC)  ICD-10-CM: J96.11   ICD-9-CM: 518.83, 799.02                        BMI 50.0-59.9, adult (HCC)  ICD-10-CM: Z61.09   ICD-9-CM: V85.43                        Hypokalemia due to loss of potassium  ICD-10-CM: E87.6   ICD-9-CM: 276.8                        Acute on chronic respiratory failure with hypoxia and hypercapnia (HCC)  ICD-10-CM: J96.21, J96.22   ICD-9-CM: 518.84, 786.09, 799.02                        OSA (obstructive sleep apnea)  ICD-10-CM: G47.33   ICD-9-CM: 327.23                        COPD (chronic obstructive pulmonary disease) (HCC)  ICD-10-CM: J44.9   ICD-9-CM: 496                         History of PSVT (paroxysmal supraventricular tachycardia)  ICD-10-CM: Z86.79   ICD-9-CM: V12.59                        Anemia  ICD-10-CM: D64.9   ICD-9-CM: 285.9                        Hypomagnesemia  ICD-10-CM: E83.42   ICD-9-CM: 275.2                        Systemic inflammatory response syndrome (SIRS) (HCC)  ICD-10-CM: R65.10   ICD-9-CM: 995.90  PVC (premature ventricular contraction)  ICD-10-CM: I49.3   ICD-9-CM: 427.69                        Acute exacerbation of CHF (congestive heart failure) (HCC)  ICD-10-CM: I50.9   ICD-9-CM: 428.0                        Acute on chronic diastolic (congestive) heart failure (HCC)  ICD-10-CM: I50.33   ICD-9-CM: 428.33, 428.0                        CHF (congestive heart failure) (HCC)  ICD-10-CM: I50.9   ICD-9-CM: 428.0                        Elevated bilirubin  ICD-10-CM: R17   ICD-9-CM: 277.4                        Liver cirrhosis (HCC)  ICD-10-CM: K74.60   ICD-9-CM: 571.5                        Colitis  ICD-10-CM: K52.9   ICD-9-CM: 558.9                        Thrombocytopenia (HCC)  ICD-10-CM: D69.6   ICD-9-CM: 287.5                        Morbid obesity (HCC)  ICD-10-CM: E66.01   ICD-9-CM: 278.01                        CKD (chronic kidney disease), stage III (HCC)  ICD-10-CM: N18.30   ICD-9-CM: 585.3                        Aortic valve replaced  ICD-10-CM: Z95.2   ICD-9-CM: V43.3                        Pacemaker  ICD-10-CM: Z95.0   ICD-9-CM: V45.01                        Hypothyroidism  ICD-10-CM: E03.9   ICD-9-CM: 244.9                        Asthma  ICD-10-CM: J45.909   ICD-9-CM: 493.90                        Anxiety and depression  ICD-10-CM: F41.9, F32.A   ICD-9-CM: 300.00, 311                        DM (diabetes mellitus), type 2 with complications (HCC)  ICD-10-CM: E11.8   ICD-9-CM: 250.90                        Chronic narcotic use  ICD-10-CM: F11.90   ICD-9-CM: 305.50                        Chronic pain  ICD-10-CM: G89.29   ICD-9-CM: 338.29  Rhinitis  ICD-10-CM: J31.0   ICD-9-CM: 472.0                        Hyperlipidemia  ICD-10-CM: E78.5   ICD-9-CM: 272.4                        Neuropathy  ICD-10-CM: G62.9   ICD-9-CM: 355.9                        GERD (gastroesophageal reflux disease)  ICD-10-CM: K21.9   ICD-9-CM: 530.81                        Arm paresthesia, left  ICD-10-CM: R20.2   ICD-9-CM: 782.0                         Past Medical History:         has a past medical history of (HFpEF) heart failure with preserved ejection fraction (HCC), Anxiety and depression, Aortic valve replaced, Asthma, Chronic narcotic use, Chronic obstructive pulmonary disease (HCC), Chronic pain, CKD (chronic kidney disease),  stage III (HCC), 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        Past Surgical History:         has a past surgical history that includes hx pacemaker; colonoscopy (N/A, 12/01/2020); and hx aortic valve replacement.        Home Medications:          Prior to Admission medications             Medication  Sig  Start Date  End Date  Taking?  Authorizing Provider            bumetanide (BUMEX) 1 mg tablet  Take 1 Tablet by mouth daily.  04/25/21    Yes  Horald Chestnut, DO     lidocaine 4 % patch  Apply to low back   Apply patch to the affected area for 12 hours a day and remove for 12 hours a day.  04/24/21    Yes  Horald Chestnut, DO     arformoteroL (BROVANA) 15 mcg/2 mL nebu neb solution  2 mL by Nebulization route two (2) times a day.  04/24/21    Yes  Horald Chestnut, DO     miconazole (MICOTIN) 2 % topical powder  Apply  to affected area two (2) times a day. APPLY TO bilateral breasts      Nursing, document site in comments  04/24/21    Yes  Jamil, Arna Medici, DO     atorvastatin (LIPITOR) 10 mg tablet  Take 1 Tablet by mouth daily.      Yes  Provider,  Historical     metoprolol succinate (TOPROL-XL) 25 mg XL tablet  Take 1 Tablet by mouth daily.  02/21/21    Yes  Thurston Pounds, MD     levothyroxine (SYNTHROID) 150 mcg tablet  Take 1 Tablet by mouth Daily (before breakfast).  01/27/21    Yes  Tefera, Mesfin, MD     aspirin 81 mg chewable tablet  Take 1 Tablet by mouth daily.  12/03/20    Yes  Carrolyn Meiers, MD     acetaminophen (TYLENOL) 500 mg tablet  Take 2 Tablets by mouth every  six (6) hours as needed for Pain.      Yes  Provider, Historical     albuterol (PROVENTIL HFA, VENTOLIN HFA, PROAIR HFA) 90 mcg/actuation inhaler  Take 2 Puffs by inhalation every six (6) hours as needed.      Yes  Provider, Historical     citalopram (CELEXA) 40 mg tablet  Take 1 Tablet by mouth daily.  02/22/20    Yes  Provider, Historical     fluticasone propionate (FLONASE) 50 mcg/actuation nasal spray  2 Sprays by Nasal route daily as needed.      Yes  Provider, Historical     sucralfate (CARAFATE) 1 gram tablet  Take 1 Tablet by mouth three (3) times daily.      Yes  Provider, Historical     traZODone (DESYREL) 100 mg tablet  Take 1 Tablet by mouth nightly.  12/31/19    Yes  Provider, Historical     glimepiride (AMARYL) 2 mg tablet  Take 1 Tablet by mouth two (2) times a day.      Yes  Provider, Historical     montelukast (SINGULAIR) 10 mg tablet  Take 1 Tablet by mouth nightly.      Yes  Provider, Historical     cefTRIAXone 2 gram 2 g IV syringe  2 g by IntraVENous route every twenty-four (24) hours for 29 days.  04/24/21  05/23/21    Horald Chestnut, DO            naloxone (Narcan) 4 mg/actuation nasal spray  Use 1 spray intranasally, then discard. Repeat with new spray every 2 min as needed for opioid overdose symptoms, alternating nostrils.  04/24/21      Horald Chestnut, DO             Allergies/Social/Family History:          Allergies        Allergen  Reactions         ?  Nitroglycerin  Unknown (comments)             hypotension         ?  Aloe Vera  Rash     ?  Hydrochlorothiazide  Other  (comments)             Reports 'kidneys dry up"          ?  Tetanus And Diphther. Tox (Pf)  Swelling             Swelling of arm and it turns black           Social History          Tobacco Use         ?  Smoking status:  Former              Packs/day:  1.00         Years:  40.00         Pack years:  40.00         Types:  Cigarettes         Quit date:  03/21/2010         Years since quitting:  11.1         ?  Smokeless tobacco:  Never       Substance Use Topics         ?  Alcohol use:  Not Currently           Family History  Problem  Relation  Age of Onset          ?  Hypertension  Mother            ?  Hypertension  Father               Review of Systems:        5 point ROS is negative except per HPI.        Objective:     Vital Signs:   Visit Vitals      BP  103/71     Pulse  83     Temp  97.9 F (36.6 C)     Resp  13     Ht  5\' 7"  (1.702 m)     Wt  (!) 160.7 kg (354 lb 4.5 oz)     SpO2  99%        BMI  55.49 kg/m      O2 Flow Rate (L/min): 2 l/min O2 Device: Nasal cannula Temp (24hrs), Avg:97.9 F (36.6 C), Min:97.3 F (36.3 C), Max:98.6 F (37 C)               Intake/Output:       Intake/Output Summary (Last 24 hours) at 05/19/2021 0927   Last data filed at 05/19/2021 0600     Gross per 24 hour        Intake  992.74 ml        Output  1350 ml        Net  -357.26 ml           Physical Exam:   Performed via video assessment.   Gen: Patient is alert in NAD.   HEENT: NC/AT, EOMI   Chest: Chest movement is equal bilaterally   Cardiac: Cardiac monitor reveals SR   Extremities: Extremities appear well perfused with no obvious edema   Neuro: Nonfocal      LABS AND  DATA: Personally reviewed     Recent Labs            05/19/21   0504  05/18/21   1916     WBC  8.7  2.8*     HGB  8.6*  8.2*     HCT  28.6*  27.4*         PLT  97*  62*          Recent Labs              05/19/21   0504  05/18/21   1916  05/18/21   0417  05/17/21   2056     NA  134*  134*    < >  137     K  4.0  4.1    < >  3.6     CL  95*  94*    < >   97     CO2  38*  37*    < >  40*     BUN  37*  36*    < >  27*     CREA  1.33*  1.40*    < >  1.14*     GLU  190*  203*    < >  88     CA  9.2  9.1    < >  9.1     MG   --    --    --   1.9        < > =  values in this interval not displayed.          Recent Labs           05/17/21   1745     AP  45     TP  7.0     ALB  3.0*        GLOB  4.0        No results for input(s): INR, PTP, APTT, INREXT in the last 72 hours.      Recent Labs           05/17/21   1019     PHI  7.37     PCO2I  65.8*        PO2I  70*        No results for input(s): CPK, CKMB, TROIQ, BNPP in the last 72 hours.      Hemodynamics:         PAP:     CO:        Wedge:     CI:        CVP:      SVR:  BP 2: 113/40 (05/18/21 1403)              PVR:           Ventilator Settings:          Mode  Rate  Tidal Volume  Pressure  FiO2  PEEP                         28 %              Peak airway pressure:            Minute ventilation:  7.6 l/min            MEDS: Reviewed      Chest X-Ray:     CXR Results  (Last 48 hours)                                       05/17/21 1057    XR CHEST PORT  Final result            Impression:    1. The lungs demonstrate decreasing pulmonary edema                       Narrative:    EXAM:  XR CHEST PORT             INDICATION: Fever             COMPARISON: 05/14/2021             TECHNIQUE: Upright portable chest AP view 1047 hours             FINDINGS: The patient is on a cardiac monitor. There is no change in the      moderate cardiomegaly. Pacemaker remains in place. The lungs demonstrate      decrease in bilateral pulmonary edema. Mild areas of atelectasis are noted at      the lung bases.             Right-sided PICC line overlies the SVC.  DISPOSITION   ICU      Critical Care Time   The patient is critically ill with septic shock.  If I do not acutely intervene upon these illnesses, the patient's life is in danger.  These illnesses have required me to: (1) perform high complexity  decision  making for assessment and support; (2) assess the patient via video; (3) personally review the medical record and laboratory and diagnostic imaging results; (4) actively titrate high-alert medications; (5) manage the ventilator and actively titrate  oxygen; (6) discuss this patient's case management with other healthcare providers; and (7) apply and interpret advanced monitoring techniques.      As a result of this, I personally spent 35 minutes providing critical care services exclusively to this patient.  This was in exclusion of the time spent performing procedures or teaching.      Purnell Shoemaker, DO, MBA   Intensivist   [facility]   05/19/2021

## 2021-05-19 NOTE — Progress Notes (Signed)
Progress Notes by Tedd Sias, PT, DPT at 05/19/21 1421                Author: Tedd Sias, PT, DPT  Service: Physical Therapy  Author Type: Physical Therapist       Filed: 05/19/21 1426  Date of Service: 05/19/21 1421  Status: Addendum          Editor: Tedd Sias, PT, DPT (Physical Therapist)          Related Notes: Original Note by Tedd Sias, PT, DPT (Physical Therapist) filed at 05/19/21  1421                  Problem: Mobility Impaired (Adult and Pediatric)   Goal: *Acute Goals and Plan of Care (Insert Text)   Description: FUNCTIONAL STATUS PRIOR TO ADMISSION: Patient was modified independent using a single point cane for household and community  amb. Recent hospital admission and d/c to [facility] last 2+ weeks then readmit    HOME SUPPORT PRIOR TO ADMISSION: The patient lived with son's family but did not require assist.    Physical Therapy Goals  Revised 05/19/2021  1.   Patient will move from supine to sit and sit to supine , scoot up and down, and roll side to side in bed with modified independence within 7 day(s).  2.  Patient will transfer from bed to chair and chair to bed with modified independence using the  least restrictive device within 7 day(s).  3.  Patient will perform sit to stand with modified independence within 7 day(s).  4.  Patient will ambulate with modified independence for 50 feet with the least restrictive device within 7 day(s).    5.  Patient will ascend/descend 5 stairs with 1 handrail(s) with modified independence within 7 day(s).         Physical Therapy Goals  Initiated 05/12/2021; reviewed 4/21 and remain appropriate as below   1.  Patient will move from  supine to sit and sit to supine , scoot up and down, and roll side to side in bed with modified independence within 7 day(s).  NOT MET  2.  Patient will transfer from bed to chair and chair to bed with modified independence using the least restrictive  device within 7 day(s). NOT MET  3.  Patient  will perform sit to stand with modified independence within 7 day(s). NOT MET  4.  Patient will ambulate with modified independence for 50 feet with the least restrictive device within 7 day(s). NOT  MET  5.  Patient will ascend/descend 5 stairs with 1 handrail(s) and SPC with modified independence within 7 day(s). NOT MET   Note:    PHYSICAL THERAPY TREATMENT: WEEKLY REASSESSMENT   Patient: Deborah [name] (66 y.o. female)   Date: 05/19/2021   Primary Diagnosis: Acute on chronic heart failure with preserved ejection fraction (HFpEF) ([place]) [I50.33]         Precautions:   Fall           ASSESSMENT   Patient continues with skilled PT services and is progressing towards goals. Patient seen for weekly reassess and following yesterday's code S call. Results of CT Head :" reveal n o acute intracranial findings.CT Brain perfusion test revealed nonspecific hyperintense perfusion deficits in the brainstem may represent artifacts. However, acute ischemia is not completely excluded. CTA head and neck with No large vessel occlusion or  significant flow-limiting stenosis. Other findings: Cervical spondylosis with areas  of at least mild to moderate spinal canal   narrowing and severe neural foraminal narrowing at C4-C7."    Patient reports less SOB with activity and is able to perform sit<>stand twice with stable SPO2 on 2L 94-95% range. Patient BP very low at rest sitting in chair 83/48 but actually improved to 100/83  in standing. BP difficult to attain on patient being so low so questionable accuracy. Patient becomes symptomatic with less than 2 minutes standing and has to return to sitting in chair. Patient is slowly progressing back toward her baseline as "they  keep taking this fluid off." Patient's goal to d/c to home and she is agreeable to HHPT. Patient with chronic bilateral end stage OA knee pain and has limited tol for short distance household amb. She reports there is only 4 hrs daily between her two  son's work scheduled  where she is home alone. If patient's BP can stabilize such that she can tol amb short distances as well as ascend /descend 5 steps with minimal assistance then recommend d/c to home.      Patient's progression toward goals since last assessment: Weaning supplemental O2 needs from 6L to 2L , work up for CVA with code S on 05/18/21       Current Level of Function Impacting Discharge (mobility/balance): unable to tol amb or stair climbing due to low BP; Nursing assisted up to chair with CG A prior to PT/OT      Other factors to consider for discharge:                PLAN :   Goals have been updated based on progression since last assessment.   Patient continues to benefit from skilled intervention to address the above impairments.      Recommendations and Planned Interventions: bed mobility training, transfer training, gait training, therapeutic exercises, patient and family training/education, and therapeutic activities        Frequency/Duration: Patient will be followed by physical therapy:  5 times a week to address goals.      Recommendation for discharge: (in order for the patient to meet his/her long term  goals)   Physical therapy at least 2 days/week in the home       This discharge recommendation:   Has not yet been discussed the attending provider and/or case management      IF patient discharges home will need the following DME: none                SUBJECTIVE:     Patient stated I have all equipment .        OBJECTIVE DATA SUMMARY:     HISTORY:       Past Medical History:        Diagnosis  Date         ?  (HFpEF) heart failure with preserved ejection fraction ([place])       ?  Anxiety and depression       ?  Aortic valve replaced            S/p bovine aortic valve replacement.         ?  Asthma       ?  Chronic narcotic use       ?  Chronic obstructive pulmonary disease (HCC)       ?  Chronic pain       ?  CKD (chronic kidney disease), stage III ([place])  Baseline creatinine is 1.3-1.4 with GFR in the  40s.         ?  DM type 2 causing renal disease ([place])       ?  GERD (gastroesophageal reflux disease)       ?  History of vascular access device  04/13/2021          4 FR Single PICC for LTABX: R cephalic vessell length 48 CM Max P leave @ 1 CM out; Arm circumferenc 40 CM         ?  Hyperlipidemia       ?  Hypothyroidism       ?  Morbid obesity ([place])       ?  Neuropathy       ?  Obstructive sleep apnea           ?  Rhinitis            Past Surgical History:         Procedure  Laterality  Date          ?  COLONOSCOPY  N/A  12/01/2020          COLONOSCOPY performed by Rachael Darby, MD at [facility]          ?  HX AORTIC VALVE REPLACEMENT              Bovine bioprosthetic          ?  HX PACEMAKER               Personal factors and/or comorbidities impacting plan of care:       Home Situation   Home Environment: Private residence   # Steps to Enter: 5   Rails to Enter: Yes   Hand Rails : Right   One/Two Story Residence: One story   Living Alone: No   Support Systems: Child(ren)   Patient Expects to be Discharged to:: Home with home health   Current DME Used/Available at Home: Kasandra Knudsen, straight, Environmental consultant, rolling, Wheelchair   Tub or Shower Type: Tub/Shower combination      EXAMINATION/PRESENTATION/DECISION MAKING:    Critical Behavior:   Neurologic State: Alert, Appropriate for age, Eyes open spontaneously   Orientation Level: Appropriate for age, Oriented X4   Cognition: Appropriate decision making, Appropriate for age attention/concentration, Appropriate safety awareness, Follows commands   Safety/Judgement: Awareness of environment   Hearing:   Auditory   Auditory Impairment: None         Range Of Motion:        WFLs x 4 ext                           Strength:          Decreased but  WFLs x 4 ext      Coordination:        Decreased but  WFLs   Functional Mobility:   Bed Mobility:   Rolling:  (in chair on arrival)               Transfers:   Sit to Stand: Contact guard assistance   Stand to Sit: Contact guard assistance    Stand Pivot Transfers:  (NT)                         Balance:    Sitting: Intact;With support   Standing: Impaired;With support   Standing - Static:  Constant support;Good   Standing - Dynamic : Not tested   Ambulation/Gait Training:      Gait Description (WDL):  (deferred amb due to low BP)                      Therapeutic Exercises:    In sitting - reviewed with patient knee raises LAQs and ankle pumps, advised to repeat alternating repetitions until mm fatigue         Pain Rating:   Back pain chronic      Activity Tolerance:    Poor, desaturates with exertion and requires oxygen, requires frequent rest breaks, and signs and symptoms of orthostatic hypotension      After treatment patient left in no apparent distress:    Sitting in chair and Call bell within reach        COMMUNICATION/EDUCATION:     The patients plan of care was discussed with: Occupational therapist and Registered nurse.       Fall prevention education was provided and the patient/caregiver indicated understanding. and Patient/family have participated as able in goal setting and plan of care.      Thank you for this referral.   Tedd Sias, PT, DPT    Time Calculation: 13 mins

## 2021-05-19 NOTE — Progress Notes (Signed)
Problem: Pressure Injury - Risk of  Goal: *Prevention of pressure injury  Description: Document Braden Scale and appropriate interventions in the flowsheet.  Outcome: Progressing Towards Goal  Note: Pressure Injury Interventions:  Sensory Interventions: Assess changes in LOC, Assess need for specialty bed, Avoid rigorous massage over bony prominences, Float heels, Keep linens dry and wrinkle-free, Minimize linen layers, Pressure redistribution bed/mattress (bed type), Turn and reposition approx. every two hours (pillows and wedges if needed)    Moisture Interventions: Absorbent underpads, Apply protective barrier, creams and emollients, Internal/External urinary devices    Activity Interventions: Assess need for specialty bed, Pressure redistribution bed/mattress(bed type), PT/OT evaluation    Mobility Interventions: Assess need for specialty bed, Float heels, HOB 30 degrees or less, Pressure redistribution bed/mattress (bed type)    Nutrition Interventions: Document food/fluid/supplement intake    Friction and Shear Interventions: Apply protective barrier, creams and emollients, HOB 30 degrees or less, Minimize layers                Problem: Patient Education: Go to Patient Education Activity  Goal: Patient/Family Education  Outcome: Progressing Towards Goal     Problem: Falls - Risk of  Goal: *Absence of Falls  Description: Document Schmid Fall Risk and appropriate interventions in the flowsheet.  Outcome: Progressing Towards Goal  Note: Fall Risk Interventions:  Mobility Interventions: Assess mobility with egress test         Medication Interventions: Assess postural VS orthostatic hypotension, Bed/chair exit alarm    Elimination Interventions: Bed/chair exit alarm    History of Falls Interventions: Consult care management for discharge planning         Problem: Patient Education: Go to Patient Education Activity  Goal: Patient/Family Education  Outcome: Progressing Towards Goal     Problem: Breathing Pattern -  Ineffective  Goal: *Absence of hypoxia  Outcome: Progressing Towards Goal  Goal: *Use of effective breathing techniques  Outcome: Progressing Towards Goal     Problem: Patient Education: Go to Patient Education Activity  Goal: Patient/Family Education  Outcome: Progressing Towards Goal     Problem: TIA/CVA Stroke: 0-24 hours  Goal: Off Pathway (Use only if patient is Off Pathway)  Outcome: Progressing Towards Goal  Goal: Activity/Safety  Outcome: Progressing Towards Goal  Goal: Consults, if ordered  Outcome: Progressing Towards Goal  Goal: Diagnostic Test/Procedures  Outcome: Progressing Towards Goal  Goal: Nutrition/Diet  Outcome: Progressing Towards Goal  Goal: Discharge Planning  Outcome: Progressing Towards Goal  Goal: Medications  Outcome: Progressing Towards Goal  Goal: Respiratory  Outcome: Progressing Towards Goal  Goal: Treatments/Interventions/Procedures  Outcome: Progressing Towards Goal  Goal: Minimize risk of bleeding post-thrombolytic infusion  Outcome: Progressing Towards Goal  Goal: Monitor for complications post-thrombolytic infusion  Outcome: Progressing Towards Goal  Goal: Psychosocial  Outcome: Progressing Towards Goal  Goal: *Hemodynamically stable  Outcome: Progressing Towards Goal  Goal: *Neurologically stable  Description: Absence of additional neurological deficits    Outcome: Progressing Towards Goal  Goal: *Verbalizes anxiety and depression are reduced or absent  Outcome: Progressing Towards Goal  Goal: *Absence of Signs of Aspiration on Current Diet  Outcome: Progressing Towards Goal  Goal: *Absence of deep venous thrombosis signs and symptoms(Stroke Metric)  Outcome: Progressing Towards Goal  Goal: *Ability to perform ADLs and demonstrates progressive mobility and function  Outcome: Progressing Towards Goal  Goal: *Stroke education started(Stroke Metric)  Outcome: Progressing Towards Goal  Goal: *Dysphagia screen performed(Stroke Metric)  Outcome: Progressing Towards Goal  Goal: *Rehab  consulted(Stroke Metric)  Outcome: Progressing  Towards Goal     Problem: TIA/CVA Stroke: Day 2 Until Discharge  Goal: Off Pathway (Use only if patient is Off Pathway)  Outcome: Progressing Towards Goal  Goal: Activity/Safety  Outcome: Progressing Towards Goal  Goal: Diagnostic Test/Procedures  Outcome: Progressing Towards Goal  Goal: Nutrition/Diet  Outcome: Progressing Towards Goal  Goal: Discharge Planning  Outcome: Progressing Towards Goal  Goal: Medications  Outcome: Progressing Towards Goal  Goal: Respiratory  Outcome: Progressing Towards Goal  Goal: Treatments/Interventions/Procedures  Outcome: Progressing Towards Goal  Goal: Psychosocial  Outcome: Progressing Towards Goal  Goal: *Verbalizes anxiety and depression are reduced or absent  Outcome: Progressing Towards Goal  Goal: *Absence of aspiration  Outcome: Progressing Towards Goal  Goal: *Absence of deep venous thrombosis signs and symptoms(Stroke Metric)  Outcome: Progressing Towards Goal  Goal: *Optimal pain control at patient's stated goal  Outcome: Progressing Towards Goal  Goal: *Tolerating diet  Outcome: Progressing Towards Goal  Goal: *Ability to perform ADLs and demonstrates progressive mobility and function  Outcome: Progressing Towards Goal  Goal: *Stroke education continued(Stroke Metric)  Outcome: Progressing Towards Goal     Problem: Ischemic Stroke: Discharge Outcomes  Goal: *Verbalizes anxiety and depression are reduced or absent  Outcome: Progressing Towards Goal  Goal: *Verbalize understanding of risk factor modification(Stroke Metric)  Outcome: Progressing Towards Goal  Goal: *Hemodynamically stable  Outcome: Progressing Towards Goal  Goal: *Absence of aspiration pneumonia  Outcome: Progressing Towards Goal  Goal: *Aware of needed dietary changes  Outcome: Progressing Towards Goal  Goal: *Verbalize understanding of prescribed medications including anti-coagulants, anti-lipid, and/or anti-platelets(Stroke Metric)  Outcome: Progressing  Towards Goal  Goal: *Tolerating diet  Outcome: Progressing Towards Goal  Goal: *Aware of follow-up diagnostics related to anticoagulants  Outcome: Progressing Towards Goal  Goal: *Ability to perform ADLs and demonstrates progressive mobility and function  Outcome: Progressing Towards Goal  Goal: *Absence of DVT(Stroke Metric)  Outcome: Progressing Towards Goal  Goal: *Absence of aspiration  Outcome: Progressing Towards Goal  Goal: *Optimal pain control at patient's stated goal  Outcome: Progressing Towards Goal  Goal: *Home safety concerns addressed  Outcome: Progressing Towards Goal  Goal: *Describes available resources and support systems  Outcome: Progressing Towards Goal  Goal: *Verbalizes understanding of activation of EMS(911) for stroke symptoms(Stroke Metric)  Outcome: Progressing Towards Goal  Goal: *Understands and describes signs and symptoms to report to providers(Stroke Metric)  Outcome: Progressing Towards Goal  Goal: *Neurolgocially stable (absence of additional neurological deficits)  Outcome: Progressing Towards Goal  Goal: *Verbalizes importance of follow-up with primary care physician(Stroke Metric)  Outcome: Progressing Towards Goal  Goal: *Smoking cessation discussed,if applicable(Stroke Metric)  Outcome: Progressing Towards Goal  Goal: *Depression screening completed(Stroke Metric)  Outcome: Progressing Towards Goal     Problem: Patient Education: Go to Patient Education Activity  Goal: Patient/Family Education  Outcome: Progressing Towards Goal

## 2021-05-19 NOTE — Progress Notes (Signed)
Progress Notes by Thane Edu, RN at 05/19/21 2138                Author: Thane Edu, RN  Service: EMERGENCY  Author Type: Registered Nurse       Filed: 05/19/21 2139  Date of Service: 05/19/21 2138  Status: Signed          Editor: Thane Edu, RN (Registered Nurse)               TRANSFER - OUT REPORT:      Verbal report given to Lauren(name) on Deborah [name]  being transferred to [facility](unit) for routine progression of care         Report consisted of patients Situation, Background, Assessment and    Recommendations(SBAR).       Information from the following report(s) SBAR, Kardex, Intake/Output, MAR, and Cardiac Rhythm Paced  was reviewed with the receiving nurse.      Lines:      PICC Single Lumen 04/13/21 Right;Cephalic (Active)         Central Line Being Utilized  Yes  05/19/21 1952     Criteria for Appropriate Use  Long term IV/antibiotic administration  05/19/21 1952     Site Assessment  Clean, dry, & intact  05/19/21 1952     Phlebitis Assessment  0  05/19/21 1952     Infiltration Assessment  0  05/19/21 1952     Date of Last Dressing Change  05/15/21  05/19/21 1952     Dressing Status  Clean, dry, & intact  05/19/21 1952     Dressing Type  Transparent  05/19/21 1952     Hub Color/Line Status  Blue;Flushed;Capped  05/19/21 1952     Action Taken  Open ports on tubing capped  05/19/21 1952     Positive Blood Return (Site #1)  Yes  05/19/21 1200     Alcohol Cap Used  Yes  05/19/21 1200               Peripheral IV 05/09/21 Left Antecubital (Active)         Site Assessment  Intact  05/19/21 1952     Phlebitis Assessment  0  05/19/21 1952     Infiltration Assessment  0  05/19/21 1952     Dressing Status  Old drainage;Intact  05/19/21 1952     Dressing Type  Transparent  05/19/21 1952     Hub Color/Line Status  Pink  05/19/21 1952     Action Taken  Open ports on tubing capped  05/19/21 1952         Alcohol Cap Used  Yes  05/19/21 1952            Opportunity for questions and  clarification was provided.        Patient transported with:    Monitor   O2 @ 2 liters   Registered Nurse   [employer]

## 2021-05-19 NOTE — Progress Notes (Signed)
Progress Notes by Ancil Boozer, RT at 05/19/21 5093                Author: Ancil Boozer, RT  Service: --  Author Type: Respiratory Therapist       Filed: 05/19/21 0915  Date of Service: 05/19/21 0914  Status: Signed          Editor: Sizemore, McKenzie, RT (Respiratory Therapist)               Patients nebs changed per protocol. Patient now Q6 verses Q4. Patient takes Q6 PRN at home. Will see how patient tolerates.           05/19/21 0913       Chart and Patient  Assessment        Pulmonary History  1     Surgical History  0     Chest X-ray  1     Respiratory Pattern  0     Mental Status  0     Breath Sounds  2     Cough  0     Level of Activity/Mobility  1        Respiratory Assessment Total Score  5

## 2021-05-19 NOTE — Progress Notes (Signed)
@  0700 Bedside and Verbal shift change report given to Hassell Halim RN (oncoming nurse) by Kathryne Gin RN (offgoing nurse). Report included the following information SBAR, Kardex, ED Summary, Intake/Output, MAR, Accordion, Recent Results, Med Rec Status, Cardiac Rhythm Pace, and Alarm Parameters .      @0800  Patient NAD calm and cooperative.  Took off of bipap and placed on 4 lpm nasal canula.  Blood glucose 222 will cover with 3 units of SQ insulin.    @0945  OOB to chair with stand by assist.  Patient uses single cane to transfer over to chair.  Also Neo stopped.    @1000  O2 wean to 2lpm nasal canula.    @1130  Blood glucose 245 will cover with 3 units of SQ insulin.    @1245  PT/OT arrive and work with patient.    @1700  Blood glucose 200 covered with 3 units of SQ insulin.    @1900  Bedside and Verbal shift change report given to (oncoming nurse) by RN (offgoing nurse). Report included the following information SBAR, Kardex, ED Summary, Intake/Output, MAR, Accordion, Recent Results, Med Rec Status, Cardiac Rhythm pace, and Alarm Parameters .

## 2021-05-19 NOTE — Progress Notes (Signed)
Problem: Self Care Deficits Care Plan (Adult)  Goal: *Acute Goals and Plan of Care (Insert Text)  Description: FUNCTIONAL STATUS PRIOR TO ADMISSION: Patient was modified independent using a single point cane for functional mobility. She reports able to care for self prior to recent hospitalization and rehab stay.    HOME SUPPORT: The patient lived with son and daughter in law but did not require assist.    Occupational Therapy Goals  Initiated 05/13/2021, Reviewed for weekly assessment, goals remain appropriate  1.  Patient will perform lower body dressing with modified independence within 7 day(s).  2.  Patient will perform lower body dressing with modified independence within 7 day(s).  3.  Patient will perform toilet transfers with modified independence within 7 day(s).  4.  Patient will perform all aspects of toileting with modified independence within 7 day(s).  5.  Patient will participate in upper extremity therapeutic exercise/activities with supervision/set-up for 10 minutes within 7 day(s).    6.  Patient will utilize energy conservation techniques during functional activities with verbal cues within 7 day(s).    05/19/2021 1435 by Verita Schneiders, OTR/L  Outcome: Progressing Towards Goal  OCCUPATIONAL THERAPY TREATMENT/WEEKLY RE-ASSESSMENT  Patient: Deborah [name] (66 y.o. female)  Date: 05/19/2021  Diagnosis: Acute on chronic heart failure with preserved ejection fraction (HFpEF) (HCC) [I50.33] Acute on chronic heart failure with preserved ejection fraction (HFpEF) (HCC)      Precautions: Fall  Chart, occupational therapy assessment, plan of care, and goals were reviewed.    ASSESSMENT  Patient continues with skilled OT services and is progressing towards goals. Patient seen for weekly reassess and following yesterday's code S call. Results of CT Head revealed no acute intracranial findings. Patient BP very low at rest sitting in chair 83/48 but actually improved to 100/83 in standing. BP difficult to attain  on patient being so low so questionable accuracy d/t forearm cuff. Patient becomes symptomatic with less than 2 minutes standing and has to return to sitting in chair.  Pt currently on 2L O2, 92-97%.  Prior to admission, pt was not on O2 at home.  Pt would like to return home.  Will con't follow and progress pt to mod I to independent level.  Anticipate pt would benefit from [facility] at d/c.      Patient's progression toward goals since last assessment: Weaning supplemental O2 needs from 6L to 2L , work up for CVA with code S on 05/18/21      Current Level of Function Impacting Discharge (ADLs): upto mod assist    Other factors to consider for discharge: low BP         PLAN :  Goals have been updated based on progression since last assessment. Patient continues to benefit from skilled intervention to address the above impairments. Continue to follow patient 4 times a week to address goals.    Recommend with staff:     Recommend next OT session: see POC    Recommendation for discharge: (in order for the patient to meet his/her long term goals)  Occupational therapy at least 2 days/week in the home     This discharge recommendation:  A follow-up discussion with the attending provider and/or case management is planned    IF patient discharges home will need the following DME: none       SUBJECTIVE:   Patient stated "I would like to return home."    OBJECTIVE DATA SUMMARY:   Cognitive/Behavioral Status:  Neurologic State: Alert  Orientation Level:  Oriented X4  Cognition: Appropriate decision making        Safety/Judgement: Awareness of environment    Functional Mobility and Transfers for ADLs:  Bed Mobility:  Rolling:  (in chair on arrival)    Transfers:  Sit to Stand: Contact guard assistance          Balance:  Sitting: Intact;With support  Standing: Impaired;With support  Standing - Static: Constant support;Good  Standing - Dynamic : Not tested    ADL Intervention:  Feeding  Feeding Assistance:  Independent    Grooming  Grooming Assistance: Set-up    Upper Body Bathing  Bathing Assistance: Minimum assistance    Type of Bath: Chlorhexidine (CHG);Bath Pack;Full    Lower Body Bathing  Bathing Assistance: Moderate assistance    Upper Body Dressing Assistance  Dressing Assistance: Set-up    Lower Body Dressing Assistance  Dressing Assistance: Moderate assistance         Cognitive Retraining  Safety/Judgement: Awareness of environment      Pain:  No c/o of pain    Activity Tolerance:   Fair    After treatment patient left in no apparent distress:   Sitting in chair and Call bell within reach    COMMUNICATION/COLLABORATION:   The patient's plan of care was discussed with: Physical therapist and Registered nurse.     Verita Schneiders, OTR/L  Time Calculation: 12 mins

## 2021-05-19 NOTE — Progress Notes (Signed)
Progress Notes by [name], MD at 05/19/21 [phone]                Author: [name], MD  Service: Internal Medicine  Author Type: Physician       Filed: 05/20/21 1333  Date of Service: 05/19/21 0959  Status: Addendum          Editor: [name], MD (Physician)          Related Notes: Original Note by [name], MD (Physician) filed at 05/19/21 1225                    [facility]   [address] [place], [place]  [address]   [phone]             Hospitalist Progress Note            NAME:  Samanvi Cuccia    DOB:  [birth year]    MRN:  [id number]      Date/Time:  05/19/2021       Patient PCP:  [name], NP      Emergency Contact:     Extended Emergency Contact Information   Primary Emergency Contact: [facility] Phone: [phone]   Mobile Phone: [phone]   Relation: Son   Interpreter needed? No        Code: Full Code       Isolation Precautions: There are currently no Active Isolations                Subjective:        REASON FOR VISIT:  Recheck CHF      HPI & INTERVAL HISTORY:      Ms. Pina is a 66 y.o. female with history that includes HFpEF, COPD, DM, and HTN presents with shortness of breath due to CHF.      4/22: The patient was seen and examined.  She is looking and feeling better today she is up in chair.  Denies chest pain is on nasal cannula.  Blood pressure remains soft with a systolic of 86 when I entered the room but on recheck it was 90/76.      4/21: Seen and examined in ICU. She remained on BiPAP and Norepi throughout the night.  She is still on both. She is still lethargic but answers questions.  Denies CP and SOB while on BiPAP.      4/20: Rapid called as pt was hypotensive and feeling dizzy. SBP down to 86 but now 96.  Feels minimally better.  No CP or SOB but feels nauseated.        4/19: Reports some imrpovement with her breathing at rest/ Still with edema.  Denies CP and abd pain.         ALLERGIES      Allergies        Allergen  Reactions         ?  Nitroglycerin  Unknown (comments)             hypotension         ?  Aloe Vera  Rash     ?  Hydrochlorothiazide  Other (comments)             Reports 'kidneys dry up"          ?  Tetanus And Diphther. Tox (Pf)  Swelling  Swelling of arm and it turns black                       Objective:         Visit Vitals      BP  103/71     Pulse  83     Temp  97.9 F (36.6 C)     Resp  13     Ht   (1.702 m)     Wt  (!) 160.7 kg (354 lb 4.5 oz)     SpO2  99%        BMI  55.49 kg/m           General: no distress   Head: Normocephalic, without obvious abnormality, atraumatic   Eyes: anicteric sclerae, conjuntiva clear, and mild anisocoria with pupils 7 mm on the right and 5 mm on the left.   ENT: lips, mucosa, and tongue normal   Neck: normal, supple, and no tenderness   Lungs: CTA, normal respiratory effort, and decreased breath sounds   Heart: S1, S2 normal, regular rate, and regular rhythm   Abd: obese, not distended, soft, nontender, BS present and normactive   Ext: no cyanosis and bilateral lower extremity 3+ edema   Skin: normal skin color, no rashes, and texture normal   Neuro:  alert, oriented, no defects noted in general exam.   Psych: not anxious, cooperative, appropriate affect         Medications:     Current Facility-Administered Medications          Medication  Dose  Route  Frequency           ?  PHENYLephrine (NEO-SYNEPHRINE) 50 mg in 0.9% sodium chloride 250 mL (Vial2Bag)   10-100 mcg/min  IntraVENous  TITRATE           ?  bumetanide (BUMEX) tablet 1 mg   1 mg  Oral  BID     ?  predniSONE (DELTASONE) tablet 5 mg   5 mg  Oral  DAILY WITH BREAKFAST     ?  albuterol-ipratropium (DUO-NEB) 2.5 MG-0.5 MG/3 ML   3 mL  Nebulization  Q6H RT     ?  pantoprazole (PROTONIX) tablet 40 mg   40 mg  Oral  ACB     ?  heparin (porcine) injection 5,000 Units   5,000 Units  SubCUTAneous  Q8H     ?  [Held by provider] metoprolol succinate (TOPROL-XL) XL tablet 25 mg   25  mg  Oral  DAILY     ?  acetaZOLAMIDE (DIAMOX) tablet 250 mg   250 mg  Oral  BID     ?  nystatin (MYCOSTATIN) 100,000 unit/gram cream     Topical  BID     ?  traZODone (DESYREL) tablet 100 mg   100 mg  Oral  QHS     ?  oxyCODONE IR (ROXICODONE) tablet 5 mg   5 mg  Oral  Q6H PRN     ?  albuterol-ipratropium (DUO-NEB) 2.5 MG-0.5 MG/3 ML   3 mL  Nebulization  Q4H PRN     ?  insulin lispro (HUMALOG) injection     SubCUTAneous  AC&HS     ?  glucose chewable tablet 16 g   4 Tablet  Oral  PRN     ?  glucagon (GLUCAGEN) injection 1 mg   1 mg  IntraMUSCular  PRN     ?  dextrose 10% infusion 0-250 mL   0-250 mL  IntraVENous  PRN     ?  [Held by provider] arformoteroL (BROVANA) neb solution 15 mcg   15 mcg  Nebulization  BID RT     ?  budesonide (PULMICORT) 500 mcg/2 ml nebulizer suspension   500 mcg  Nebulization  BID RT     ?  sodium chloride (NS) flush 5-40 mL   5-40 mL  IntraVENous  Q8H     ?  sodium chloride (NS) flush 5-40 mL   5-40 mL  IntraVENous  PRN     ?  acetaminophen (TYLENOL) tablet 650 mg   650 mg  Oral  Q6H PRN          Or           ?  acetaminophen (TYLENOL) suppository 650 mg   650 mg  Rectal  Q6H PRN     ?  polyethylene glycol (MIRALAX) packet 17 g   17 g  Oral  DAILY PRN     ?  senna (SENOKOT) tablet 8.6 mg   1 Tablet  Oral  DAILY PRN     ?  promethazine (PHENERGAN) tablet 12.5 mg   12.5 mg  Oral  Q6H PRN          Or           ?  ondansetron (ZOFRAN) injection 4 mg   4 mg  IntraVENous  Q6H PRN     ?  cefTRIAXone (ROCEPHIN) 2 g in 0.9% sodium chloride 20 mL IV syringe   2 g  IntraVENous  Q24H     ?  aspirin chewable tablet 81 mg   81 mg  Oral  DAILY     ?  atorvastatin (LIPITOR) tablet 10 mg   10 mg  Oral  DAILY     ?  citalopram (CELEXA) tablet 40 mg   40 mg  Oral  DAILY     ?  [Held by provider] glipiZIDE (GLUCOTROL) tablet 5 mg   5 mg  Oral  BID WITH MEALS     ?  levothyroxine (SYNTHROID) tablet 150 mcg   150 mcg  Oral  ACB     ?  montelukast (SINGULAIR) tablet 10 mg   10 mg  Oral  QHS           ?   sucralfate (CARAFATE) tablet 1 g   1 g  Oral  TID            Labs:     Recent Labs           05/19/21   0504     WBC  8.7     HGB  8.6*     HCT  28.6*        PLT  97*             Recent Labs              05/19/21   0504  05/18/21   0417  05/17/21   2056  05/17/21   1745     NA  134*    < >  137   --      K  4.0    < >  3.6   --      CL  95*    < >  97   --      CO2  38*    < >  40*   --  GLU  190*    < >  88   --      BUN  37*    < >  27*   --      CREA  1.33*    < >  1.14*   --      CA  9.2    < >  9.1   --      MG   --    --   1.9   --      ALB   --    --    --   3.0*     TBILI   --    --    --   1.4*     ALT   --    --    --   17        < > = values in this interval not displayed.              Reviewed:             Radiology:   CTA CODE NEURO HEAD AND NECK W CONT      Result Date: 05/18/2021   CT Brain Perfusion: Nonspecific hyperintense perfusion deficits in the brainstem may represent artifacts. However, acute ischemia is not completely excluded. Clinical clinical correlation is advised. CTA Head: No large vessel occlusion or significant  flow-limiting stenosis. CTA Neck: No large vessel occlusion or significant flow-limiting stenosis. Apparent right subclavian artery. Others: Cervical spondylosis with areas of at least mild to moderate spinal canal narrowing and severe neural foraminal  narrowing at C4-C7.       CT CODE NEURO HEAD WO CONTRAST      Result Date: 05/18/2021   No acute intracranial findings.        CT CODE NEURO PERF W CBF      Result Date: 05/18/2021   CT Brain Perfusion: Nonspecific hyperintense perfusion deficits in the brainstem may represent artifacts. However, acute ischemia is not completely excluded. Clinical clinical correlation is advised. CTA Head: No large vessel occlusion or significant  flow-limiting stenosis. CTA Neck: No large vessel occlusion or significant flow-limiting stenosis. Apparent right subclavian artery. Others: Cervical spondylosis with areas of at least mild to  moderate spinal canal narrowing and severe neural foraminal  narrowing at C4-C7.              The External notes reviewed:, ER provider's note, labs, imaging studies, medications, consultants notes, and latest ancillary notes was reviewed by me on:  May 19, 2021                  Assessment/Plan:          Acute on chronic heart failure with preserved ejection fraction POA     Appears to be due to inconsistent administration of medications     Echo on 03/2021 showing an EF of 50 to 55%     Remains fluid overloaded      Careful diuresis.  Continue Bumex now 1 mg p.o. twice daily     Defer diuresis to cards now that pt is on pressors     Continue low-sodium diet and fluid restrictions when off BiPAP     Cardiology following and notes reviewed      Hypotension requiring pressor     Now off of vasopressor     Consider Midodrine but she seems to run soft and is currently asymptomatic.  Blood pressures are done  on the forearms which may also contribute to the low numbers.      Fever possible UTI     Recent bacteremia.      Continue Rocephin     UA consistent with UTI      UCX pending      Acute on chronic respiratory failure with hypoxia and hypercapnia  with COPD AE     Due to CHF     BiPAP for now wean as tolerated     Continue with Brovana, Pulmicort, as oxygen supplementation     Continue with systemic corticosteroids has been switched over from Solu-Medrol to prednisone.      Acute encephalopathy likely d/t hypercapnia     Treat as above      Bacteremia due to group B Streptococcus (05/09/2021) POA     Had GBS bacteremia complicated by pacemaker vegetation on previous admission     Continue ceftriaxone through 05/23/21     Continue to monitor seems to have had low-grade temp last night      COPD POA     Not in exacerbation.        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      Has bioprosthetic AVR     Pacer not MRI compatible      Hypothyroidism     TSH within normal limits continue levothyroxine current dose      Anemia POA     Appears to be AOCD      Thrombocytopenia     Hold heparin and use SCDs      Anxiety and depression POA     Stable continue Celexa      HLD POA     Continue Lipitor      Anisocoria     Just about resolved      Body mass index is 55.49 kg/m.:  40 or above:  Morbid obesity and Weight loss advised      F: None   E: Hyponatremia   N: ADULT ORAL NUTRITION SUPPLEMENT Lunch; Protein Modular   ADULT ORAL NUTRITION SUPPLEMENT HS Snack; Fortified Pudding   ADULT DIET Regular; 3 carb choices (45 gm/meal); Low Sodium (2 gm); 1500 ml          Risk of deterioration: High      Discussed:  Pt's condition, Imaging findings, Lab findings, Assessment, and Care Plan discussed with: Patient, RN, and Care Manager      Prophylaxis:  SCD's and No anticoagulation due to thrombocytopenia      Anticipated discharge disposition:  HHC      HR DC Barriers: Currently not medically stable for discharge         Total time: 30 minutes **I personally saw and examined the patient during this time period**                ___________________________________________________      Signed:    Donnal MoatHugo M Gaspard Isbell, MD

## 2021-05-20 LAB — GLUCOSE, POC
Glucose (POC): 194 mg/dL — ABNORMAL HIGH (ref 65–117)
Glucose (POC): 130 mg/dL — ABNORMAL HIGH (ref 65–117)
Glucose (POC): 165 mg/dL — ABNORMAL HIGH (ref 65–117)
Glucose (POC): 197 mg/dL — ABNORMAL HIGH (ref 65–117)

## 2021-05-20 LAB — CBC WITH AUTOMATED DIFF
ABS. BASOPHILS: 0 10*3/uL (ref 0.0–0.1)
ABS. EOSINOPHILS: 0 10*3/uL (ref 0.0–0.4)
ABS. IMM. GRANS.: 0 10*3/uL (ref 0.00–0.04)
ABS. LYMPHOCYTES: 1 10*3/uL (ref 0.8–3.5)
ABS. MONOCYTES: 0.5 10*3/uL (ref 0.0–1.0)
ABSOLUTE NRBC: 0 10*3/uL (ref 0.00–0.01)
BASOPHILS: 0 % (ref 0–1)
EOSINOPHILS: 1 % (ref 0–7)
HCT: 26.9 % — ABNORMAL LOW (ref 35.0–47.0)
HGB: 8 g/dL — ABNORMAL LOW (ref 11.5–16.0)
IMMATURE GRANULOCYTES: 1 % — ABNORMAL HIGH (ref 0.0–0.5)
LYMPHOCYTES: 16 % (ref 12–49)
MCH: 28.3 PG (ref 26.0–34.0)
MCHC: 29.7 g/dL — ABNORMAL LOW (ref 30.0–36.5)
MCV: 95.1 FL (ref 80.0–99.0)
MONOCYTES: 8 % (ref 5–13)
MPV: 11.5 FL (ref 8.9–12.9)
NEUTROPHILS: 74 % (ref 32–75)
NRBC: 0 PER 100 WBC
PLATELET: 85 10*3/uL — ABNORMAL LOW (ref 150–400)
RBC: 2.83 M/uL — ABNORMAL LOW (ref 3.80–5.20)
RDW: 16.5 % — ABNORMAL HIGH (ref 11.5–14.5)
WBC: 5.9 10*3/uL (ref 3.6–11.0)

## 2021-05-20 LAB — CULTURE, URINE
Colonies Counted: 80000
Colony Count: 80000

## 2021-05-20 LAB — METABOLIC PANEL, BASIC
Anion gap: 2 mmol/L — ABNORMAL LOW (ref 5–15)
BUN/Creatinine ratio: 33 — ABNORMAL HIGH (ref 12–20)
BUN: 44 MG/DL — ABNORMAL HIGH (ref 6–20)
CO2: 37 mmol/L — ABNORMAL HIGH (ref 21–32)
Calcium: 8.9 MG/DL (ref 8.5–10.1)
Chloride: 97 mmol/L (ref 97–108)
Creatinine: 1.34 MG/DL — ABNORMAL HIGH (ref 0.55–1.02)
Glucose: 154 mg/dL — ABNORMAL HIGH (ref 65–100)
Potassium: 3.6 mmol/L (ref 3.5–5.1)
Sodium: 136 mmol/L (ref 136–145)
eGFR: 44 mL/min/{1.73_m2} — ABNORMAL LOW (ref >60)

## 2021-05-20 LAB — MAGNESIUM
Magnesium: 2.3 mg/dL (ref 1.6–2.4)
Magnesium: 2.3 mg/dL (ref 1.6–2.4)

## 2021-05-20 LAB — CULTURE, BLOOD, PAIRED

## 2021-05-20 LAB — CBC WITH AUTO DIFFERENTIAL
Basophils %: 0 % (ref 0–1)
Basophils Absolute: 0 10*3/uL (ref 0.0–0.1)
Eosinophils %: 1 % (ref 0–7)
Eosinophils Absolute: 0 10*3/uL (ref 0.0–0.4)
Granulocyte Absolute Count: 0 10*3/uL (ref 0.00–0.04)
Hematocrit: 26.9 % — ABNORMAL LOW (ref 35.0–47.0)
Hemoglobin: 8 g/dL — ABNORMAL LOW (ref 11.5–16.0)
Immature Granulocytes: 1 % — ABNORMAL HIGH (ref 0.0–0.5)
Lymphocytes %: 16 % (ref 12–49)
Lymphocytes Absolute: 1 10*3/uL (ref 0.8–3.5)
MCH: 28.3 PG (ref 26.0–34.0)
MCHC: 29.7 g/dL — ABNORMAL LOW (ref 30.0–36.5)
MCV: 95.1 FL (ref 80.0–99.0)
MPV: 11.5 FL (ref 8.9–12.9)
Monocytes %: 8 % (ref 5–13)
Monocytes Absolute: 0.5 10*3/uL (ref 0.0–1.0)
NRBC Absolute: 0 10*3/uL (ref 0.00–0.01)
Neutrophils %: 74 % (ref 32–75)
Neutrophils Absolute: 4.3 10*3/uL (ref 1.8–8.0)
Nucleated RBCs: 0 PER 100 WBC
Platelets: 85 10*3/uL — ABNORMAL LOW (ref 150–400)
RBC: 2.83 M/uL — ABNORMAL LOW (ref 3.80–5.20)
RDW: 16.5 % — ABNORMAL HIGH (ref 11.5–14.5)
WBC: 5.9 10*3/uL (ref 3.6–11.0)

## 2021-05-20 LAB — POCT GLUCOSE
POC Glucose: 194 mg/dL — ABNORMAL HIGH (ref 65–117)
POC Glucose: 130 mg/dL — ABNORMAL HIGH (ref 65–117)
POC Glucose: 165 mg/dL — ABNORMAL HIGH (ref 65–117)
POC Glucose: 197 mg/dL — ABNORMAL HIGH (ref 65–117)

## 2021-05-20 LAB — BASIC METABOLIC PANEL
Anion Gap: 2 mmol/L — ABNORMAL LOW (ref 5–15)
BUN: 44 MG/DL — ABNORMAL HIGH (ref 6–20)
Bun/Cre Ratio: 33 — ABNORMAL HIGH (ref 12–20)
CO2: 37 mmol/L — ABNORMAL HIGH (ref 21–32)
Calcium: 8.9 MG/DL (ref 8.5–10.1)
Chloride: 97 mmol/L (ref 97–108)
Creatinine: 1.34 MG/DL — ABNORMAL HIGH (ref 0.55–1.02)
ESTIMATED GLOMERULAR FILTRATION RATE: 44 mL/min/{1.73_m2} — ABNORMAL LOW (ref >60)
Glucose: 154 mg/dL — ABNORMAL HIGH (ref 65–100)
Potassium: 3.6 mmol/L (ref 3.5–5.1)
Sodium: 136 mmol/L (ref 136–145)

## 2021-05-20 MED ORDER — MEROPENEM 1 GRAM IV SOLR
1 gram | INTRAVENOUS | Status: AC
Start: 2021-05-20 — End: 2021-05-20
  Administered 2021-05-20: 16:00:00 via INTRAVENOUS

## 2021-05-20 MED ORDER — SODIUM CHLORIDE 0.9 % IV PIGGY BACK
1 gram | Freq: Three times a day (TID) | INTRAVENOUS | Status: DC
Start: 2021-05-20 — End: 2021-05-25
  Administered 2021-05-21 – 2021-05-25 (×15): via INTRAVENOUS

## 2021-05-20 MED FILL — HEPARIN (PORCINE) 5,000 UNIT/ML IJ SOLN: 5000 unit/mL | INTRAMUSCULAR | Qty: 1

## 2021-05-20 MED FILL — CARAFATE 1 GRAM TABLET: 1 gram | ORAL | Qty: 1

## 2021-05-20 MED FILL — PREDNISONE 5 MG TAB: 5 mg | ORAL | Qty: 1

## 2021-05-20 MED FILL — BUDESONIDE 0.5 MG/2 ML NEB SUSPENSION: 0.5 mg/2 mL | RESPIRATORY_TRACT | Qty: 1

## 2021-05-20 MED FILL — PANTOPRAZOLE 40 MG TAB, DELAYED RELEASE: 40 mg | ORAL | Qty: 1

## 2021-05-20 MED FILL — MONTELUKAST 10 MG TAB: 10 mg | ORAL | Qty: 1

## 2021-05-20 MED FILL — NYSTATIN 100,000 UNIT/G TOPICAL CREAM: 100000 unit/gram | CUTANEOUS | Qty: 15

## 2021-05-20 MED FILL — IPRATROPIUM-ALBUTEROL 2.5 MG-0.5 MG/3 ML NEB SOLUTION: 2.5 mg-0.5 mg/3 ml | RESPIRATORY_TRACT | Qty: 3

## 2021-05-20 MED FILL — INSULIN LISPRO 100 UNIT/ML INJECTION: 100 unit/mL | SUBCUTANEOUS | Qty: 2

## 2021-05-20 MED FILL — ACETAMINOPHEN 325 MG TABLET: 325 mg | ORAL | Qty: 2

## 2021-05-20 MED FILL — LEVOTHYROXINE 150 MCG TAB: 150 mcg | ORAL | Qty: 1

## 2021-05-20 MED FILL — ACETAZOLAMIDE 250 MG TAB: 250 mg | ORAL | Qty: 1

## 2021-05-20 MED FILL — CITALOPRAM 20 MG TAB: 20 mg | ORAL | Qty: 2

## 2021-05-20 MED FILL — BUMETANIDE 1 MG TAB: 1 mg | ORAL | Qty: 1

## 2021-05-20 MED FILL — CEFTRIAXONE 2 GRAM SOLUTION FOR INJECTION: 2 gram | INTRAMUSCULAR | Qty: 2

## 2021-05-20 MED FILL — TRAZODONE 50 MG TAB: 50 mg | ORAL | Qty: 2

## 2021-05-20 MED FILL — ATORVASTATIN 10 MG TAB: 10 mg | ORAL | Qty: 1

## 2021-05-20 MED FILL — ASPIRIN 81 MG CHEWABLE TAB: 81 mg | ORAL | Qty: 1

## 2021-05-20 MED FILL — MEROPENEM 1 GRAM IV SOLR: 1 gram | INTRAVENOUS | Qty: 1

## 2021-05-20 MED FILL — OXYCODONE 5 MG TAB: 5 mg | ORAL | Qty: 1

## 2021-05-20 MED FILL — VAZCULEP 10 MG/ML INJECTION SOLUTION: 10 mg/mL | INTRAMUSCULAR | Qty: 5

## 2021-05-20 NOTE — Progress Notes (Signed)
Progress Notes by Donnal Moatodriguez, Kittie Krizan M, MD at 05/20/21 [phone]                Author: Donnal Moatodriguez, Bhavya Grand M, MD  Service: Internal Medicine  Author Type: Physician       Filed: 05/20/21 1329  Date of Service: 05/20/21 0625  Status: Signed          Editor: Donnal Moatodriguez, Lorraina Spring M, MD (Physician)                    [facility]   [address] [place], [place]  [id number]   [phone]             Hospitalist Progress Note            NAME:  Deborah LoftDonna [name]    DOB:  Dec 09, 1955    MRN:  [id number]      Date/Time:  05/20/2021       Patient PCP:  Tanya NonesBarrett, Rita M, NP      Emergency Contact:     Extended Emergency Contact Information   [facility]   Home Phone: [phone]   Mobile Phone: [phone]   Relation: Son   Interpreter needed? No        Code: Full Code       Isolation Precautions: There are currently no Active Isolations                Subjective:        REASON FOR VISIT:  Recheck CHF      HPI & INTERVAL HISTORY:      Deborah [name] is a 66 y.o. female with history that includes HFpEF, COPD, DM, and HTN presents with shortness of breath due to CHF.      4/23: Resting comfortably no new complaints and no acute issues overnight.  Blood pressure remains low but again she tends to run low.  No chest pain and shortness of breath just about resolved.  Urine culture with E. coli ESBL.      4/22: The patient was seen and examined.  She is looking and feeling better today she is up in chair.  Denies chest pain is on nasal cannula.  Blood pressure remains soft with a systolic of 86 when I entered the room but on recheck it was 90/76.      4/21: Seen and examined in ICU. She remained on BiPAP and Norepi throughout the night.  She is still on both. She is still lethargic but answers questions.  Denies CP and SOB while on BiPAP.      4/20: Rapid called as pt was hypotensive and feeling dizzy. SBP down to 86 but now 96.  Feels minimally better.  No CP or SOB but feels nauseated.         4/19: Reports some imrpovement with her breathing at rest/ Still with edema.  Denies CP and abd pain.         ALLERGIES     Allergies        Allergen  Reactions         ?  Nitroglycerin  Unknown (comments)             hypotension         ?  Aloe Vera  Rash     ?  Hydrochlorothiazide  Other (comments)             Reports 'kidneys dry up"          ?  Tetanus And Diphther. Tox (Pf)  Swelling             Swelling of arm and it turns black                       Objective:         Visit Vitals      BP  (!) 103/53     Pulse  66     Temp  97.6 F (36.4 C)     Resp  19     Ht   (1.702 m)     Wt  156.5 kg (345 lb 1.6 oz)     SpO2  100%        BMI  54.05 kg/m           General: no distress and morbidly obese   Head: Normocephalic, without obvious abnormality, atraumatic   Eyes: anicteric sclerae, conjuntiva clear, and mild anisocoria with pupils 7 mm on the right and 5 mm on the left.   ENT: lips, mucosa, and tongue normal   Neck: normal, supple, and no tenderness   Lungs: clear to auscultation and decreased breath sounds   Heart: S1, S2 normal, regular rate, and regular rhythm   Abd: obese, not distended, soft, nontender, BS present and normactive   Ext: no cyanosis and bilateral lower extremity 2+ edema   Skin: normal skin color, no rashes, and texture normal   Neuro:  alert, oriented, no defects noted in general exam.   Psych: not anxious, cooperative, appropriate affect         Medications:     Current Facility-Administered Medications          Medication  Dose  Route  Frequency           ?  PHENYLephrine (NEO-SYNEPHRINE) 50 mg in 0.9% sodium chloride 250 mL (Vial2Bag)   10-100 mcg/min  IntraVENous  TITRATE     ?  bumetanide (BUMEX) tablet 1 mg   1 mg  Oral  BID     ?  predniSONE (DELTASONE) tablet 5 mg   5 mg  Oral  DAILY WITH BREAKFAST     ?  albuterol-ipratropium (DUO-NEB) 2.5 MG-0.5 MG/3 ML   3 mL  Nebulization  Q6H RT     ?  pantoprazole (PROTONIX) tablet 40 mg   40 mg  Oral  ACB     ?  heparin (porcine)  injection 5,000 Units   5,000 Units  SubCUTAneous  Q8H     ?  [Held by provider] metoprolol succinate (TOPROL-XL) XL tablet 25 mg   25 mg  Oral  DAILY     ?  acetaZOLAMIDE (DIAMOX) tablet 250 mg   250 mg  Oral  BID     ?  nystatin (MYCOSTATIN) 100,000 unit/gram cream     Topical  BID     ?  traZODone (DESYREL) tablet 100 mg   100 mg  Oral  QHS     ?  oxyCODONE IR (ROXICODONE) tablet 5 mg   5 mg  Oral  Q6H PRN     ?  albuterol-ipratropium (DUO-NEB) 2.5 MG-0.5 MG/3 ML   3 mL  Nebulization  Q4H PRN     ?  insulin lispro (HUMALOG) injection     SubCUTAneous  AC&HS     ?  glucose chewable tablet 16 g   4 Tablet  Oral  PRN     ?  glucagon (GLUCAGEN) injection  1 mg   1 mg  IntraMUSCular  PRN     ?  dextrose 10% infusion 0-250 mL   0-250 mL  IntraVENous  PRN     ?  [Held by provider] arformoteroL (BROVANA) neb solution 15 mcg   15 mcg  Nebulization  BID RT           ?  budesonide (PULMICORT) 500 mcg/2 ml nebulizer suspension   500 mcg  Nebulization  BID RT           ?  sodium chloride (NS) flush 5-40 mL   5-40 mL  IntraVENous  Q8H     ?  sodium chloride (NS) flush 5-40 mL   5-40 mL  IntraVENous  PRN     ?  acetaminophen (TYLENOL) tablet 650 mg   650 mg  Oral  Q6H PRN          Or           ?  acetaminophen (TYLENOL) suppository 650 mg   650 mg  Rectal  Q6H PRN     ?  polyethylene glycol (MIRALAX) packet 17 g   17 g  Oral  DAILY PRN     ?  senna (SENOKOT) tablet 8.6 mg   1 Tablet  Oral  DAILY PRN     ?  promethazine (PHENERGAN) tablet 12.5 mg   12.5 mg  Oral  Q6H PRN          Or           ?  ondansetron (ZOFRAN) injection 4 mg   4 mg  IntraVENous  Q6H PRN     ?  cefTRIAXone (ROCEPHIN) 2 g in 0.9% sodium chloride 20 mL IV syringe   2 g  IntraVENous  Q24H     ?  aspirin chewable tablet 81 mg   81 mg  Oral  DAILY     ?  atorvastatin (LIPITOR) tablet 10 mg   10 mg  Oral  DAILY     ?  citalopram (CELEXA) tablet 40 mg   40 mg  Oral  DAILY     ?  [Held by provider] glipiZIDE (GLUCOTROL) tablet 5 mg   5 mg  Oral  BID WITH MEALS     ?   levothyroxine (SYNTHROID) tablet 150 mcg   150 mcg  Oral  ACB     ?  montelukast (SINGULAIR) tablet 10 mg   10 mg  Oral  QHS           ?  sucralfate (CARAFATE) tablet 1 g   1 g  Oral  TID            Labs:     Recent Labs           05/20/21   0251     WBC  5.9     HGB  8.0*     HCT  26.9*        PLT  85*             Recent Labs             05/20/21   0251  05/17/21   2056  05/17/21   1745     NA  136    < >   --      K  3.6    < >   --      CL  97    < >   --  CO2  37*    < >   --      GLU  154*    < >   --      BUN  44*    < >   --      CREA  1.34*    < >   --      CA  8.9    < >   --      MG  2.3    < >   --      ALB   --    --   3.0*     TBILI   --    --   1.4*     ALT   --    --   17        < > = values in this interval not displayed.              Reviewed:             Radiology:   No results found.            The External notes reviewed:, ER provider's note, labs, imaging studies, medications, consultants notes, and latest ancillary notes was reviewed by me on:  May 20, 2021                  Assessment/Plan:          Acute on chronic heart failure with preserved ejection fraction POA     Appears to be due to inconsistent administration of medications     Echo on 03/2021 showing an EF of 50 to 55%     Improving clinically and subjectively     Careful diuresis.  Continue Bumex now 1 mg p.o. twice daily     Continue low-sodium diet and fluid restrictions when off BiPAP     Cardiology following and notes reviewed      Hypotension requiring pressor     Back on pressor currently on phenylephrine IV     Consider Midodrine but she seems to run soft and is currently asymptomatic.  Blood pressures are done on the forearms which may also contribute to the low numbers.     Treat UTI as below      ESBL E. coli     Had fever hypotension and recent bacteremia.      Change Rocephin to meropenem today based on urine culture     Afebrile      Acute on chronic respiratory failure with hypoxia and hypercapnia   with COPD AE     Improving     Due to CHF     BiPAP for now wean as tolerated     Continue with Brovana, Pulmicort, as oxygen supplementation     Continue with prednisone (switched from solumedrol)      Acute encephalopathy likely d/t hypercapnia     Treat as above      Bacteremia due to group B Streptococcus (05/09/2021) POA     Had GBS bacteremia complicated by pacemaker vegetation on previous admission     Continue ceftriaxone through 05/23/21     Continue to monitor seems to have had low-grade temp last night      COPD POA     Not in exacerbation.        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     Has bioprosthetic AVR     Pacer not MRI compatible      Hypothyroidism     TSH within normal limits continue levothyroxine current dose      Anemia POA     Appears to be AOCD      Thrombocytopenia     Hold heparin and use SCDs      Anxiety and depression POA     Stable continue Celexa      HLD POA     Continue Lipitor      Anisocoria     Just about resolved      Body mass index is 54.05 kg/m.:  40 or above:  Morbid obesity and Weight loss advised      F: None   E: Hyponatremia   N: ADULT ORAL NUTRITION SUPPLEMENT Lunch; Protein Modular   ADULT ORAL NUTRITION SUPPLEMENT HS Snack; Fortified Pudding   ADULT DIET Regular; 3 carb choices (45 gm/meal); Low Sodium (2 gm); 1500 ml   DIET ONE TIME MESSAGE          Risk of deterioration: High      Discussed:  Pt's condition, Imaging findings, Lab findings, Assessment, and Care Plan discussed with: Patient, RN, and Care Manager      Prophylaxis:  SCD's and No anticoagulation due to thrombocytopenia      Anticipated discharge disposition:  HHC      HR DC Barriers: Currently not medically stable for discharge         Total critical care time was 30 minutes in direct patient care with this critically ill patient.  (1st 30-95min: [id number])                 ___________________________________________________      Signed:    [name], MD

## 2021-05-20 NOTE — Progress Notes (Signed)
0700  Bedside and Verbal shift change report given to Charlies Constable RN (oncoming nurse) by Leotis Shames RN (offgoing nurse). Report included the following information SBAR, Kardex, Procedure Summary, Intake/Output, MAR, Accordion, and Recent Results.    Initial assessment done. AOX4. No complaints of pain.     This patient was assisted with Intentional Toileting every 2 hours during this shift as appropriate.  Documentation of ambulation and output reflected on Flowsheet as appropriate.  Purposeful hourly rounding was completed using AIDET and 5Ps.  Outcomes of PHR documented as they occurred. Bed alarm in use as appropriate.  Dual Suction and ambubag in place.    Visit Vitals  BP (!) 90/54 (BP 1 Location: Left lower arm, BP Patient Position: Sitting)   Pulse 89   Temp 98 F (36.7 C)   Resp 23   Ht 5\' 7"  (1.702 m)   Wt 156.5 kg (345 lb 1.6 oz)   SpO2 90%   BMI 54.05 kg/m     1900  Bedside shift change report given to Deanna RN (oncoming nurse) by RN (offgoing nurse). Report included the following information SBAR, Kardex, Procedure Summary, Intake/Output, MAR, and Accordion.

## 2021-05-20 NOTE — Progress Notes (Signed)
Advanced Practice Nurse by Luella Cook, NP at 05/20/21 0037                Author: Luella Cook, NP  Service: Nurse Practitioner  Author Type: Nurse Practitioner       Filed: 05/20/21 0206  Date of Service: 05/20/21 0037  Status: Addendum          Editor: Luella Cook, NP (Nurse Practitioner)          Related Notes: Original Note by Luella Cook, NP (Nurse Practitioner) filed at 05/20/21  0040               Notified by nursing of pt transferred to Stepdown, BP now soft with maps less than 65 (goal > 65). Will restart Neo at this time. Nursing notified to restart. Will continue to monitor,  remains in paced rhythm.       0200: BP responded well to neo- will have nursing wean down as tol.

## 2021-05-21 LAB — IRON PROFILE
Iron % saturation: 10 % — ABNORMAL LOW (ref 20–50)
Iron: 34 ug/dL — ABNORMAL LOW (ref 35–150)
TIBC: 345 ug/dL (ref 250–450)

## 2021-05-21 LAB — METABOLIC PANEL, BASIC
Anion gap: 4 mmol/L — ABNORMAL LOW (ref 5–15)
BUN/Creatinine ratio: 33 — ABNORMAL HIGH (ref 12–20)
BUN: 40 MG/DL — ABNORMAL HIGH (ref 6–20)
CO2: 36 mmol/L — ABNORMAL HIGH (ref 21–32)
Calcium: 9 MG/DL (ref 8.5–10.1)
Chloride: 99 mmol/L (ref 97–108)
Creatinine: 1.21 MG/DL — ABNORMAL HIGH (ref 0.55–1.02)
Glucose: 119 mg/dL — ABNORMAL HIGH (ref 65–100)
Potassium: 3.7 mmol/L (ref 3.5–5.1)
Sodium: 139 mmol/L (ref 136–145)
eGFR: 50 mL/min/{1.73_m2} — ABNORMAL LOW (ref >60)

## 2021-05-21 LAB — HEMOGLOBIN A1C WITH EAG
Est. average glucose: 117 mg/dL
Hemoglobin A1c: 5.7 % — ABNORMAL HIGH (ref 4.0–5.6)

## 2021-05-21 LAB — CBC WITH AUTOMATED DIFF
ABS. BASOPHILS: 0 10*3/uL (ref 0.0–0.1)
ABS. EOSINOPHILS: 0.2 10*3/uL (ref 0.0–0.4)
ABS. IMM. GRANS.: 0 10*3/uL (ref 0.00–0.04)
ABS. LYMPHOCYTES: 1.1 10*3/uL (ref 0.8–3.5)
ABS. MONOCYTES: 0.4 10*3/uL (ref 0.0–1.0)
ABS. NEUTROPHILS: 3.3 10*3/uL (ref 1.8–8.0)
ABSOLUTE NRBC: 0 10*3/uL (ref 0.00–0.01)
BASOPHILS: 0 % (ref 0–1)
EOSINOPHILS: 4 % (ref 0–7)
HCT: 29.1 % — ABNORMAL LOW (ref 35.0–47.0)
HGB: 8.6 g/dL — ABNORMAL LOW (ref 11.5–16.0)
IMMATURE GRANULOCYTES: 1 % — ABNORMAL HIGH (ref 0.0–0.5)
LYMPHOCYTES: 21 % (ref 12–49)
MCH: 27.9 PG (ref 26.0–34.0)
MCHC: 29.6 g/dL — ABNORMAL LOW (ref 30.0–36.5)
MCV: 94.5 FL (ref 80.0–99.0)
MONOCYTES: 9 % (ref 5–13)
MPV: 10.9 FL (ref 8.9–12.9)
NEUTROPHILS: 66 % (ref 32–75)
NRBC: 0 PER 100 WBC
PLATELET: 72 10*3/uL — ABNORMAL LOW (ref 150–400)
RBC: 3.08 M/uL — ABNORMAL LOW (ref 3.80–5.20)
RDW: 16.6 % — ABNORMAL HIGH (ref 11.5–14.5)
WBC: 5 10*3/uL (ref 3.6–11.0)

## 2021-05-21 LAB — GLUCOSE, POC
Glucose (POC): 173 mg/dL — ABNORMAL HIGH (ref 65–117)
Glucose (POC): 121 mg/dL — ABNORMAL HIGH (ref 65–117)
Glucose (POC): 169 mg/dL — ABNORMAL HIGH (ref 65–117)
Glucose (POC): 230 mg/dL — ABNORMAL HIGH (ref 65–117)

## 2021-05-21 LAB — C REACTIVE PROTEIN, QT: C-Reactive protein: 0.35 mg/dL (ref 0.00–0.60)

## 2021-05-21 LAB — FERRITIN
Ferritin: 38 NG/ML (ref 8–252)
Ferritin: 38 NG/ML (ref 8–252)

## 2021-05-21 LAB — FOLATE
Folate: 9.6 ng/mL (ref 5.0–21.0)
Folate: 9.6 ng/mL (ref 5.0–21.0)

## 2021-05-21 LAB — VITAMIN B12
Vitamin B-12: 403 pg/mL (ref 193–986)
Vitamin B12: 403 pg/mL (ref 193–986)

## 2021-05-21 LAB — LACTIC ACID
Lactic Acid: 0.6 MMOL/L (ref 0.4–2.0)
Lactic acid: 0.6 MMOL/L (ref 0.4–2.0)

## 2021-05-21 LAB — MAGNESIUM
Magnesium: 2.2 mg/dL (ref 1.6–2.4)
Magnesium: 2.2 mg/dL (ref 1.6–2.4)

## 2021-05-21 LAB — PHOSPHORUS
Phosphorus: 3.6 MG/DL (ref 2.6–4.7)
Phosphorus: 3.6 MG/DL (ref 2.6–4.7)

## 2021-05-21 LAB — POCT GLUCOSE
POC Glucose: 173 mg/dL — ABNORMAL HIGH (ref 65–117)
POC Glucose: 121 mg/dL — ABNORMAL HIGH (ref 65–117)
POC Glucose: 169 mg/dL — ABNORMAL HIGH (ref 65–117)
POC Glucose: 230 mg/dL — ABNORMAL HIGH (ref 65–117)

## 2021-05-21 LAB — C-REACTIVE PROTEIN: CRP: 0.35 mg/dL (ref 0.00–0.60)

## 2021-05-21 LAB — CBC WITH AUTO DIFFERENTIAL
Basophils %: 0 % (ref 0–1)
Basophils Absolute: 0 10*3/uL (ref 0.0–0.1)
Eosinophils %: 4 % (ref 0–7)
Eosinophils Absolute: 0.2 10*3/uL (ref 0.0–0.4)
Granulocyte Absolute Count: 0 10*3/uL (ref 0.00–0.04)
Hematocrit: 29.1 % — ABNORMAL LOW (ref 35.0–47.0)
Hemoglobin: 8.6 g/dL — ABNORMAL LOW (ref 11.5–16.0)
Immature Granulocytes: 1 % — ABNORMAL HIGH (ref 0.0–0.5)
Lymphocytes %: 21 % (ref 12–49)
Lymphocytes Absolute: 1.1 10*3/uL (ref 0.8–3.5)
MCH: 27.9 PG (ref 26.0–34.0)
MCHC: 29.6 g/dL — ABNORMAL LOW (ref 30.0–36.5)
MCV: 94.5 FL (ref 80.0–99.0)
MPV: 10.9 FL (ref 8.9–12.9)
Monocytes %: 9 % (ref 5–13)
Monocytes Absolute: 0.4 10*3/uL (ref 0.0–1.0)
NRBC Absolute: 0 10*3/uL (ref 0.00–0.01)
Neutrophils %: 66 % (ref 32–75)
Neutrophils Absolute: 3.3 10*3/uL (ref 1.8–8.0)
Nucleated RBCs: 0 PER 100 WBC
Platelets: 72 10*3/uL — ABNORMAL LOW (ref 150–400)
RBC: 3.08 M/uL — ABNORMAL LOW (ref 3.80–5.20)
RDW: 16.6 % — ABNORMAL HIGH (ref 11.5–14.5)
WBC: 5 10*3/uL (ref 3.6–11.0)

## 2021-05-21 LAB — BASIC METABOLIC PANEL
Anion Gap: 4 mmol/L — ABNORMAL LOW (ref 5–15)
BUN: 40 MG/DL — ABNORMAL HIGH (ref 6–20)
Bun/Cre Ratio: 33 — ABNORMAL HIGH (ref 12–20)
CO2: 36 mmol/L — ABNORMAL HIGH (ref 21–32)
Calcium: 9 MG/DL (ref 8.5–10.1)
Chloride: 99 mmol/L (ref 97–108)
Creatinine: 1.21 MG/DL — ABNORMAL HIGH (ref 0.55–1.02)
ESTIMATED GLOMERULAR FILTRATION RATE: 50 mL/min/{1.73_m2} — ABNORMAL LOW (ref >60)
Glucose: 119 mg/dL — ABNORMAL HIGH (ref 65–100)
Potassium: 3.7 mmol/L (ref 3.5–5.1)
Sodium: 139 mmol/L (ref 136–145)

## 2021-05-21 LAB — HEMOGLOBIN A1C W/EAG
Hemoglobin A1C: 5.7 % — ABNORMAL HIGH (ref 4.0–5.6)
eAG: 117 mg/dL

## 2021-05-21 LAB — IRON AND TIBC
Iron Saturation: 10 % — ABNORMAL LOW (ref 20–50)
Iron: 34 ug/dL — ABNORMAL LOW (ref 35–150)
TIBC: 345 ug/dL (ref 250–450)

## 2021-05-21 MED ORDER — L.ACIDOPHILUS-L.PARACASEI-B.BIFIDUM-S.THERMOPHL 8 BILLION CELL CAPSULE
8 billion cell | Freq: Every day | ORAL | Status: AC
Start: 2021-05-21 — End: 2021-05-29
  Administered 2021-05-21 – 2021-05-29 (×9): via ORAL

## 2021-05-21 MED ORDER — BUMETANIDE 1 MG TAB
1 mg | Freq: Every day | ORAL | Status: AC
Start: 2021-05-21 — End: 2021-05-25
  Administered 2021-05-22 – 2021-05-23 (×2): via ORAL

## 2021-05-21 MED ORDER — MIDODRINE 5 MG TAB
5 mg | Freq: Three times a day (TID) | ORAL | Status: DC
Start: 2021-05-21 — End: 2021-05-22
  Administered 2021-05-21 – 2021-05-22 (×3): via ORAL

## 2021-05-21 MED FILL — ATORVASTATIN 10 MG TAB: 10 mg | ORAL | Qty: 1

## 2021-05-21 MED FILL — CARAFATE 1 GRAM TABLET: 1 gram | ORAL | Qty: 1

## 2021-05-21 MED FILL — IPRATROPIUM-ALBUTEROL 2.5 MG-0.5 MG/3 ML NEB SOLUTION: 2.5 mg-0.5 mg/3 ml | RESPIRATORY_TRACT | Qty: 3

## 2021-05-21 MED FILL — TRAZODONE 50 MG TAB: 50 mg | ORAL | Qty: 2

## 2021-05-21 MED FILL — LEVOTHYROXINE 150 MCG TAB: 150 mcg | ORAL | Qty: 1

## 2021-05-21 MED FILL — HEPARIN (PORCINE) 5,000 UNIT/ML IJ SOLN: 5000 unit/mL | INTRAMUSCULAR | Qty: 1

## 2021-05-21 MED FILL — ACETAMINOPHEN 325 MG TABLET: 325 mg | ORAL | Qty: 2

## 2021-05-21 MED FILL — VAZCULEP 10 MG/ML INJECTION SOLUTION: 10 mg/mL | INTRAMUSCULAR | Qty: 5

## 2021-05-21 MED FILL — PANTOPRAZOLE 40 MG TAB, DELAYED RELEASE: 40 mg | ORAL | Qty: 1

## 2021-05-21 MED FILL — PREDNISONE 5 MG TAB: 5 mg | ORAL | Qty: 1

## 2021-05-21 MED FILL — BUDESONIDE 0.5 MG/2 ML NEB SUSPENSION: 0.5 mg/2 mL | RESPIRATORY_TRACT | Qty: 1

## 2021-05-21 MED FILL — INSULIN LISPRO 100 UNIT/ML INJECTION: 100 unit/mL | SUBCUTANEOUS | Qty: 2

## 2021-05-21 MED FILL — MONTELUKAST 10 MG TAB: 10 mg | ORAL | Qty: 1

## 2021-05-21 MED FILL — CITALOPRAM 20 MG TAB: 20 mg | ORAL | Qty: 2

## 2021-05-21 MED FILL — BUMETANIDE 1 MG TAB: 1 mg | ORAL | Qty: 1

## 2021-05-21 MED FILL — MIDODRINE 5 MG TAB: 5 mg | ORAL | Qty: 1

## 2021-05-21 MED FILL — MEROPENEM 1 GRAM IV SOLR: 1 gram | INTRAVENOUS | Qty: 1

## 2021-05-21 MED FILL — ACETAZOLAMIDE 250 MG TAB: 250 mg | ORAL | Qty: 1

## 2021-05-21 MED FILL — ASPIRIN 81 MG CHEWABLE TAB: 81 mg | ORAL | Qty: 1

## 2021-05-21 MED FILL — RISAQUAD 8 BILLION CELL CAPSULE: 8 billion cell | ORAL | Qty: 1

## 2021-05-21 MED FILL — INSULIN LISPRO 100 UNIT/ML INJECTION: 100 unit/mL | SUBCUTANEOUS | Qty: 3

## 2021-05-21 NOTE — Progress Notes (Signed)
[place] [facility]  [address], [place], [place] [id number]  [phone]      Hospitalist Progress Note      NAME: Deborah LoftDonna [name]   DOB:  23-Sep-1955  MRM:  [id number]    Date/Time of service: 05/21/2021  9:17 AM       Subjective:     Chief Complaint:  Patient was personally seen and examined by me during this time period.  Chart reviewed.  Drowsy, fell asleep easily.  No chest pain.  Off pressors       Objective:       Vitals:       Last 24hrs VS reviewed since prior progress note. Most recent are:    Visit Vitals  BP 101/68   Pulse 75   Temp 98.1 F (36.7 C)   Resp 19   Ht 5\' 7"  (1.702 m)   Wt 155.6 kg (343 lb)   SpO2 91%   BMI 53.72 kg/m     SpO2 Readings from Last 6 Encounters:   05/21/21 91%   04/24/21 97%   03/09/21 96%   01/26/21 90%   12/03/20 96%   11/05/20 92%    O2 Flow Rate (L/min): 2 l/min     Intake/Output Summary (Last 24 hours) at 05/21/2021 [id number]  Last data filed at 05/21/2021 [id number]  Gross per 24 hour   Intake 1480 ml   Output 2750 ml   Net -1270 ml        Exam:     Physical Exam:    Gen:  Well-developed, well-nourished, morbidly obese, in no acute distress  HEENT:  Pink conjunctivae, PERRL, hearing intact to voice, moist mucous membranes  Neck:  Supple, without masses, thyroid non-tender  Resp:  No accessory muscle use, clear breath sounds without wheezes rales or rhonchi  Card:  No murmurs, normal S1, S2 without thrills, 3+ edema  Abd:  Soft, non-tender, non-distended, normoactive bowel sounds are present  Musc:  No cyanosis or clubbing  Skin:  No rashes  Neuro:  Cranial nerves 3-12 are grossly intact, generalized weakness, follows commands appropriately  Psych:  poor insight, oriented to person, place and time, alert    Medications Reviewed: (see below)    Lab Data Reviewed: (see below)    ______________________________________________________________________    Medications:     Current Facility-Administered Medications   Medication Dose Route Frequency    meropenem (MERREM) 1  g in 0.9% sodium chloride (MBP/ADV) 50 mL MBP  1 g IntraVENous Q8H    L.acidophilus-paracasei-S.thermophil-bifidobacter (RISAQUAD) 8 billion cell capsule  1 Capsule Oral DAILY    PHENYLephrine (NEO-SYNEPHRINE) 50 mg in 0.9% sodium chloride 250 mL (Vial2Bag)  10-100 mcg/min IntraVENous TITRATE    bumetanide (BUMEX) tablet 1 mg  1 mg Oral BID    predniSONE (DELTASONE) tablet 5 mg  5 mg Oral DAILY WITH BREAKFAST    albuterol-ipratropium (DUO-NEB) 2.5 MG-0.5 MG/3 ML  3 mL Nebulization Q6H RT    pantoprazole (PROTONIX) tablet 40 mg  40 mg Oral ACB    heparin (porcine) injection 5,000 Units  5,000 Units SubCUTAneous Q8H    [Held by provider] metoprolol succinate (TOPROL-XL) XL tablet 25 mg  25 mg Oral DAILY    acetaZOLAMIDE (DIAMOX) tablet 250 mg  250 mg Oral BID    nystatin (MYCOSTATIN) 100,000 unit/gram cream   Topical BID    traZODone (DESYREL) tablet 100 mg  100 mg Oral QHS    oxyCODONE IR (ROXICODONE) tablet 5 mg  5 mg Oral Q6H PRN    albuterol-ipratropium (DUO-NEB) 2.5 MG-0.5 MG/3 ML  3 mL Nebulization Q4H PRN    insulin lispro (HUMALOG) injection   SubCUTAneous AC&HS    glucose chewable tablet 16 g  4 Tablet Oral PRN    glucagon (GLUCAGEN) injection 1 mg  1 mg IntraMUSCular PRN    dextrose 10% infusion 0-250 mL  0-250 mL IntraVENous PRN    [Held by provider] arformoteroL (BROVANA) neb solution 15 mcg  15 mcg Nebulization BID RT    budesonide (PULMICORT) 500 mcg/2 ml nebulizer suspension  500 mcg Nebulization BID RT    sodium chloride (NS) flush 5-40 mL  5-40 mL IntraVENous Q8H    sodium chloride (NS) flush 5-40 mL  5-40 mL IntraVENous PRN    acetaminophen (TYLENOL) tablet 650 mg  650 mg Oral Q6H PRN    Or    acetaminophen (TYLENOL) suppository 650 mg  650 mg Rectal Q6H PRN    polyethylene glycol (MIRALAX) packet 17 g  17 g Oral DAILY PRN    senna (SENOKOT) tablet 8.6 mg  1 Tablet Oral DAILY PRN    promethazine (PHENERGAN) tablet 12.5 mg  12.5 mg Oral Q6H PRN    Or    ondansetron (ZOFRAN) injection 4 mg  4 mg  IntraVENous Q6H PRN    aspirin chewable tablet 81 mg  81 mg Oral DAILY    atorvastatin (LIPITOR) tablet 10 mg  10 mg Oral DAILY    citalopram (CELEXA) tablet 40 mg  40 mg Oral DAILY    [Held by provider] glipiZIDE (GLUCOTROL) tablet 5 mg  5 mg Oral BID WITH MEALS    levothyroxine (SYNTHROID) tablet 150 mcg  150 mcg Oral ACB    montelukast (SINGULAIR) tablet 10 mg  10 mg Oral QHS    sucralfate (CARAFATE) tablet 1 g  1 g Oral TID          Lab Review:     Recent Labs     05/21/21  0333 05/20/21  0251 05/19/21  0504   WBC 5.0 5.9 8.7   HGB 8.6* 8.0* 8.6*   HCT 29.1* 26.9* 28.6*   PLT 72* 85* 97*     Recent Labs     05/21/21  0333 05/20/21  0251 05/19/21  0504   NA 139 136 134*   K 3.7 3.6 4.0   CL 99 97 95*   CO2 36* 37* 38*   GLU 119* 154* 190*   BUN 40* 44* 37*   CREA 1.21* 1.34* 1.33*   CA 9.0 8.9 9.2   MG 2.2 2.3  --    PHOS 3.6  --   --      Lab Results   Component Value Date/Time    Glucose (POC) 121 (H) 05/21/2021 07:50 AM    Glucose (POC) 173 (H) 05/20/2021 08:53 PM    Glucose (POC) 197 (H) 05/20/2021 03:32 PM    Glucose (POC) 165 (H) 05/20/2021 12:16 PM    Glucose (POC) 130 (H) 05/20/2021 08:34 AM          Assessment / Plan:     66 yo hx of HTN, DM, dCHF EF 50-55%, PVCs s/p pacer, bioprosthetic AVR, recent group B strep bacteremia, presented w/ hypoxia, dCHF, hypotension, ESBL UTI    1) Acute resp failure/hypoxia/hypercapnea: due to CHF, OSA, obesity hypoventilation.  Will cont O2, diamox prn, bipap.  Repeat ABG today.  Consult Pulm    2) Shock: POA, still present.  Likely due to sepsis.  Had recent group B strep bacteremia, now with ESBL UTI.  Will cont pressors gtt, wean as tolerated    3) ESBL E. Coli UTI/recent group B strep bacteremia: was receiving IV CTX at home (till 04/26).  Now on IV meropenem.  Consult ID    4) Acute on chronic dCHF: EF 50-55%.  Still with significant edema.  Cont bumex, diamox.  Cards following    5) PVCs/bioprosthetic AVR: s/p pacer.  Cont ASA, statin    6) Anemia/thrombocytopenia:  likely due to sepsis.  Will check B12/folate, iron studies.  Monitor CBC.  Consult Hematology if worse    7) DM type 2: check A1C.  Cont SSI    8) Weakness: cont PT/OT.  Will need SNF    **Prior records, notes, labs, radiology, and medications reviewed in [facility]**    Total time spent with patient care: 45 Minutes (critical care due to pressors) **I personally saw and examined the patient during this time period**                 Care Plan discussed with: Patient, nursing     Discussed:  Care Plan    Prophylaxis:  Hep SQ    Disposition:  SNF/LTC           ___________________________________________________    Attending Physician: Dayna Barker, MD

## 2021-05-21 NOTE — Progress Notes (Signed)
..  Bedside shift change report given to Erin/Claire, RN (oncoming nurse) by Leotis Shames, RN (offgoing nurse). Report included the following information SBAR, Kardex, Intake/Output, MAR, and Cardiac Rhythm paced  .     1930- Assessment complete--see flowsheet     2100Randa Evens, NP notified of pt's irregular/labile heart rate. No new orders received.     2330- Reassessment complete--see flowsheet.     0330- Reassessment complete--see flowsheet.     Marland Kitchen.Bedside shift change report given to Tiffany, RN (oncoming nurse) by Derrell Lolling, RN (offgoing nurse). Report included the following information SBAR, Kardex, Intake/Output, MAR, and Cardiac Rhythm Paced .

## 2021-05-21 NOTE — Consults (Signed)
[facility]     Name: Deborah [name] MRN: [id number]   DOB: 12-06-1955 Hospital: [facility]   Date: 05/21/2021        Impression Plan   Acute respiratory failure  Hypoxia  Hypercapnea  Pulmonary infiltrates  ESBL UTI  OSA  Morbid obesity               Wean O2 to keep sats above 90%  Diuresis if BP admits. Pt currently still on Neo with borderline BP.   Duonebs  Blood cultures NGTD  Repeat CXR tomorrow AM  Continue meropenem for ESBL UTI  OOB into chair  PT/OT as BP allows           Radiology  ( personally reviewed) CXR4/20/23: basilar infiltrates-overall improving   ABG No results for input(s): PHI, PO2I, PCO2I in the last 72 hours.       Subjective     Cc: shortness of breath    66 yo with PMHx diastolic CHF, recent strep bacteremia and OSA presenting with increasing SOB. Found to have increased leg swelling and increased infiltrates after missing several doses of diuretics at rehab center. Clinical scenario complicated further by ESBL UTI and septic shock. Pt has hx of mild asthma, denies COPD. Smoked 1 ppd for 25 years, quit over 10 years ago.     Review of Systems:  A comprehensive review of systems was negative except for that written in the HPI.    Past Medical History:   Diagnosis Date    (HFpEF) heart failure with preserved ejection fraction (HCC)     Anxiety and depression     Aortic valve replaced     S/p bovine aortic valve replacement.    Asthma     Chronic narcotic use     Chronic obstructive pulmonary disease (HCC)     Chronic pain     CKD (chronic kidney disease), stage III (HCC)     Baseline creatinine is 1.3-1.4 with GFR in the 40s.    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)     GERD (gastroesophageal reflux disease)     History of vascular access device 04/13/2021    4 FR Single PICC for LTABX: R cephalic vessell length 48 CM Max P leave @ 1 CM out; Arm circumferenc 40 CM    Hyperlipidemia     Hypothyroidism     Morbid obesity (HCC)     Neuropathy     Obstructive sleep  apnea     Rhinitis       Past Surgical History:   Procedure Laterality Date    COLONOSCOPY N/A 12/01/2020    COLONOSCOPY performed by Ian Malkin, MD at [facility] ENDOSCOPY    HX AORTIC VALVE REPLACEMENT      Bovine bioprosthetic    HX PACEMAKER        Prior to Admission medications    Medication Sig Start Date End Date Taking? Authorizing Provider   bumetanide (BUMEX) 1 mg tablet Take 1 Tablet by mouth daily. 04/25/21  Yes Horald Chestnut, DO   lidocaine 4 % patch Apply to low back  Apply patch to the affected area for 12 hours a day and remove for 12 hours a day. 04/24/21  Yes Horald Chestnut, DO   arformoteroL (BROVANA) 15 mcg/2 mL nebu neb solution 2 mL by Nebulization route two (2) times a day. 04/24/21  Yes Horald Chestnut, DO   miconazole (MICOTIN) 2 % topical powder Apply  to affected  area two (2) times a day. APPLY TO bilateral breasts    Nursing, document site in comments 04/24/21  Yes Jamil, Arna MediciNora, DO   atorvastatin (LIPITOR) 10 mg tablet Take 1 Tablet by mouth daily.   Yes Provider, Historical   metoprolol succinate (TOPROL-XL) 25 mg XL tablet Take 1 Tablet by mouth daily. 02/21/21  Yes Thurston PoundsNgo, Matthew, MD   levothyroxine (SYNTHROID) 150 mcg tablet Take 1 Tablet by mouth Daily (before breakfast). 01/27/21  Yes Tefera, Mesfin, MD   aspirin 81 mg chewable tablet Take 1 Tablet by mouth daily. 12/03/20  Yes Carrolyn MeiersKhan, Salman, MD   acetaminophen (TYLENOL) 500 mg tablet Take 2 Tablets by mouth every six (6) hours as needed for Pain.   Yes Provider, Historical   albuterol (PROVENTIL HFA, VENTOLIN HFA, PROAIR HFA) 90 mcg/actuation inhaler Take 2 Puffs by inhalation every six (6) hours as needed.   Yes Provider, Historical   citalopram (CELEXA) 40 mg tablet Take 1 Tablet by mouth daily. 02/22/20  Yes Provider, Historical   fluticasone propionate (FLONASE) 50 mcg/actuation nasal spray 2 Sprays by Nasal route daily as needed.   Yes Provider, Historical   sucralfate (CARAFATE) 1 gram tablet Take 1 Tablet by mouth three (3) times daily.   Yes  Provider, Historical   traZODone (DESYREL) 100 mg tablet Take 1 Tablet by mouth nightly. 12/31/19  Yes Provider, Historical   glimepiride (AMARYL) 2 mg tablet Take 1 Tablet by mouth two (2) times a day.   Yes Provider, Historical   montelukast (SINGULAIR) 10 mg tablet Take 1 Tablet by mouth nightly.   Yes Provider, Historical   cefTRIAXone 2 gram 2 g IV syringe 2 g by IntraVENous route every twenty-four (24) hours for 29 days. 04/24/21 05/23/21  Horald ChestnutJamil, Nora, DO   naloxone (Narcan) 4 mg/actuation nasal spray Use 1 spray intranasally, then discard. Repeat with new spray every 2 min as needed for opioid overdose symptoms, alternating nostrils. 04/24/21   Horald ChestnutJamil, Nora, DO     Current Facility-Administered Medications   Medication Dose Route Frequency    [START ON 05/22/2021] bumetanide (BUMEX) tablet 1 mg  1 mg Oral DAILY    midodrine (PROAMATINE) tablet 5 mg  5 mg Oral TID WITH MEALS    meropenem (MERREM) 1 g in 0.9% sodium chloride (MBP/ADV) 50 mL MBP  1 g IntraVENous Q8H    L.acidophilus-paracasei-S.thermophil-bifidobacter (RISAQUAD) 8 billion cell capsule  1 Capsule Oral DAILY    PHENYLephrine (NEO-SYNEPHRINE) 50 mg in 0.9% sodium chloride 250 mL (Vial2Bag)  10-100 mcg/min IntraVENous TITRATE    albuterol-ipratropium (DUO-NEB) 2.5 MG-0.5 MG/3 ML  3 mL Nebulization Q6H RT    pantoprazole (PROTONIX) tablet 40 mg  40 mg Oral ACB    heparin (porcine) injection 5,000 Units  5,000 Units SubCUTAneous Q8H    [Held by provider] metoprolol succinate (TOPROL-XL) XL tablet 25 mg  25 mg Oral DAILY    acetaZOLAMIDE (DIAMOX) tablet 250 mg  250 mg Oral BID    nystatin (MYCOSTATIN) 100,000 unit/gram cream   Topical BID    traZODone (DESYREL) tablet 100 mg  100 mg Oral QHS    insulin lispro (HUMALOG) injection   SubCUTAneous AC&HS    [Held by provider] arformoteroL (BROVANA) neb solution 15 mcg  15 mcg Nebulization BID RT    budesonide (PULMICORT) 500 mcg/2 ml nebulizer suspension  500 mcg Nebulization BID RT    sodium chloride (NS) flush  5-40 mL  5-40 mL IntraVENous Q8H    aspirin chewable tablet 81 mg  81 mg Oral DAILY    atorvastatin (LIPITOR) tablet 10 mg  10 mg Oral DAILY    citalopram (CELEXA) tablet 40 mg  40 mg Oral DAILY    [Held by provider] glipiZIDE (GLUCOTROL) tablet 5 mg  5 mg Oral BID WITH MEALS    levothyroxine (SYNTHROID) tablet 150 mcg  150 mcg Oral ACB    montelukast (SINGULAIR) tablet 10 mg  10 mg Oral QHS    sucralfate (CARAFATE) tablet 1 g  1 g Oral TID     Allergies   Allergen Reactions    Nitroglycerin Unknown (comments)     hypotension    Aloe Vera Rash    Hydrochlorothiazide Other (comments)     Reports 'kidneys dry up"     Tetanus And Diphther. Tox (Pf) Swelling     Swelling of arm and it turns black      Social History     Tobacco Use    Smoking status: Former     Packs/day: 1.00     Years: 40.00     Pack years: 40.00     Types: Cigarettes     Quit date: 03/21/2010     Years since quitting: 11.1    Smokeless tobacco: Never   Substance Use Topics    Alcohol use: Not Currently      Family History   Problem Relation Age of Onset    Hypertension Mother     Hypertension Father           Laboratory: I have personally reviewed the critical care flowsheet and labs.     Recent Labs     05/21/21  0333 05/20/21  0251 05/19/21  0504   WBC 5.0 5.9 8.7   HGB 8.6* 8.0* 8.6*   HCT 29.1* 26.9* 28.6*   PLT 72* 85* 97*     Recent Labs     05/21/21  0333 05/20/21  0251 05/19/21  0504   NA 139 136 134*   K 3.7 3.6 4.0   CL 99 97 95*   CO2 36* 37* 38*   GLU 119* 154* 190*   BUN 40* 44* 37*   CREA 1.21* 1.34* 1.33*   CA 9.0 8.9 9.2   MG 2.2 2.3  --    PHOS 3.6  --   --        Objective:     Mode Rate Tidal Volume Pressure FiO2 PEEP            30 %       Vital Signs:     TMAX(24)      Intake/Output:   Last shift:      Ventilator Volumes  Vt Spont (ml): 534 ml  Ve Observed (l/min): 8.9 l/min  Last 3 shifts: 04/24 0701 - 04/24 1900  In: 700 [P.O.:700]  Out: 575 [Urine:575][id number]  Intake/Output Summary (Last 24 hours) at 05/21/2021 1645  Last data  filed at 05/21/2021 1453  Gross per 24 hour   Intake 1560 ml   Output 2825 ml   Net -1265 ml     EXAM:   GENERAL: awake, alert, obese HEENT:  PERRL, EOMI, no alar flaring or epistaxis, oral mucosa moist without cyanosis, NECK:  no jugular vein distention, no retractions, no thyromegaly or masses, LUNGS: distant, CTA, no w/r/rHEART:  Regular rate and rhythm with no MGR; +2 edema is present in LE, ABDOMEN:  soft with no tenderness,EXTREMITIES:  warm with no cyanosis, SKIN:  no jaundice or ecchymosis, and  NEUROLOGIC:  alert and oriented, grossly non-focal    Gerri Spore, MD  [facility]

## 2021-05-21 NOTE — Progress Notes (Signed)
Progress Notes by Pershing Cox, RN at 05/21/21 [phone]                Author: Pershing Cox, RN  Service: --  Author Type: Registered Nurse       Filed: 05/21/21 1520  Date of Service: 05/21/21 0736  Status: Addendum          Editor: Pershing Cox, RN (Registered Nurse)          Related Notes: Original Note by Pershing Cox, RN (Registered Nurse) filed at 05/21/21 [phone]               Bedside and Verbal shift change report given to Leotis Shames RN/ Corrie Dandy RN (oncoming nurse) by Luiz Ochoa RN (offgoing nurse). Report included the following information SBAR, Kardex, Accordion,  and Cardiac Rhythm NSR .      1500: Patient's MAP's have been consistently under 65. Neo gtt restarted at 45mcg/min.       This patient was assisted with Intentional Toileting every 2 hours during this shift as appropriate.  Documentation of ambulation and output reflected on Flowsheet as appropriate.   Purposeful hourly rounding was completed using AIDET and 5Ps.  Outcomes of PHR documented as they occurred. Bed alarm in use as appropriate.  Dual Suction and ambubag in place.   f

## 2021-05-21 NOTE — Progress Notes (Signed)
Progress  Notes by Laddie Aquas, MD at 05/21/21 1309                Author: Laddie Aquas, MD  Service: Cardiology  Author Type: Physician       Filed: 05/21/21 1325  Date of Service: 05/21/21 1309  Status: Addendum          Editor: Laddie Aquas, MD (Physician)          Related Notes: Original Note by Vincent Peyer, NP (Nurse Practitioner) filed at 05/21/21  [phone]                                                                                     [facility]                     Cardiology Care Note       Initial Encounter      Follow-up        Patient Name: Deborah [name] - DOB:1955-03-28 - RUE:[id number]   Primary Cardiologist: [facility] Physicians: Thurston Pounds MD   Consulting Cardiologist: [facility] Physicians: Dr Welton Flakes         Reason for encounter: CHF       HPI:         Deborah [name] is a 66 y.o. female with PMH of HF p EF, a fib, possible atrial lead vegetation treated abx, repeat ECHO with no evidence of vegetation with COPD, A/C resp failure who was recently discharged from hospital on  04/24/2021 to [facility] for rehab.       She says that she had not been receiving her Bumex on a consistent basis and missed several days dosing.       She started having a progressively worsening SOB associated with lower extremity swelling. She says she was using oxygen but required supplementation in the ED. She denies any cough or fever. No chills, nausea or vomiting.      Subjective:        Deborah [name] reports breathing is ok.          Assessment and Plan          1 A/C HF p EF: seems as if her diuretics were not being administered at SNF. Cont po loops.       2 Hx a fib s/p PPM (not MRI compatible)/ s/p AVR: Has had bigeminy and intermittent episodes of SVT with Pacer tracking at 150 bpm. BB  held due to hypotension . Not previously anticoagulated (thrombocytopenia).        3 Bacteremia due to group B Streptococcus: during recent admission, she had GBS bacteremia  complicated by pacemaker line vegetation. Continue IV Ceftriaxone scheduled to run through  05/23/21.       4 Acute on chronic respiratory failure with hypoxia and hypercapnia/COPD: On NC        5 Hypotension: perhaps trial of midodrine to avoid resumption of pressors and to allow resumption of BB's .        6 T 2 DM       7 CKD : stable  8 Obesity: Body mass index is 53.72 kg/m.       9 Thrombocytopenia          CARDIOLOGY ATTENDING   Patient personally seen and examined. All the elements of history and examination were personally performed. Assessment and plan was discussed and agree.        + DOE, No CP   Obese, NAD, Hrt RRR, 3/6 systolic murmur RSB, + obese, + mild LE edema    Tele - atrial paced         1) Acute on chronic HFpEF   - p/w SOB, volume overload 04/08/21   - Echo March 2023 - LVEF 50-55%   - cont oral diuretics - Bumex and diamox - as long as BP allows    - Of note she has had problems hypotension and has been on phenylephrine --> although dose of that has been weaned down today    ---> given hypotension and CHF --> recheck a complete echo ---> may need to cut back diuretic use ?      2) s/p AVR  - Echo 04/17/21 -  Aortic Valve: Not well visualized. Edwards bovine bioprosthetic valve with a size of 25 mm. Mild regurgitation. Stenosis of the aortic valve. AV mean gradient  is 38 mmHg. AV area by continuity VTI is 1.0 cm2.   - Follow up outpatient       3) History of Afib s/p PPM    - BB held due to hypotension .    - Not previously anticoagulated (thrombocytopenia).        4) Bacteremia due to group B Streptococcus: during recent admission, she had GBS bacteremia complicated by pacemaker line vegetation. Continue IV Ceftriaxone scheduled to run through 05/23/21.       5) Acute on chronic respiratory failure with hypoxia and hypercapnia/COPD:    - On NC        6) Hypotension: Will try midodrine to decrease use of pressors       Tharon Aquas, MD, [facility]              [x]      High complexity decision  making was performed   [x]      Patient is at high-risk of decompensation with multiple organ involvement                 ____________________________________________________________      Cardiac testing   04/08/21      ECHO ADULT FOLLOW-UP OR LIMITED 04/17/2021 04/17/2021      Interpretation Summary   ?  Aortic Valve: Not well visualized. Edwards bovine bioprosthetic valve with a size of 25 mm. Mild regurgitation.  Stenosis of the aortic valve. AV mean gradient is 38 mmHg. AV area by continuity VTI is 1.0 cm2.   ?  Technical qualifiers: Echo study was technically difficult due to patient's body habitus.      Signed by: 04/19/2021, DO on 04/17/2021  5:00 PM         02/16/21      NUCLEAR CARDIAC STRESS TEST 02/20/2021 02/26/2021      Interpretation Summary   ?  Nuclear Findings: LV perfusion is normal. There is no evidence of inducible ischemia.   ?  Nuclear Findings: The defect appears to be caused by breast attenuation.   ?  ECG: Resting ECG demonstrates normal sinus rhythm.   ?  ECG: Stress ECG was negative for ischemia.   ?  Stress Test: A pharmacological  stress test was performed using lexiscan. Hemodynamics are adequate for diagnosis. Blood pressure demonstrated  a normal response and heart rate demonstrated a normal response to stress. The patient's heart rate recovery was normal. The patient reported dyspnea and no chest pain during the stress test.      Ventricular bigeminy   Unifocal PVC, RBBB and superior axis PVCs      Signed by: Thurston PoundsMatthew Ngo, MD on 02/20/2021  5:00 PM      Most recent HS troponins:     Recent Labs           05/18/21   1916        TROPHS  28              ECG:      EKG Results                  Procedure  720  Value  Units  Date/Time           EKG, 12 LEAD, INITIAL [[id number]][[id number]]  Collected: 05/18/21 1916       Order Status: Completed  Updated: 05/19/21 1310                Ventricular Rate  123  BPM           Atrial Rate  57  BPM           QRS Duration  166  ms           Q-T Interval  392  ms            QTC Calculation (Bezet)  561  ms           Calculated R Axis  -73  degrees           Calculated T Axis  108  degrees                Diagnosis  --             Ventricular-paced rhythm   Abnormal ECG   When compared with ECG of 18-May-2021 01:25,   Vent. rate has increased BY   5 BPM   Confirmed by Barry Dieneswens, M.D., John ([id number]) on 05/19/2021 1:10:36 PM              EKG, 12 LEAD, INITIAL [[id number]][[id number]]  Collected: 05/18/21 0125       Order Status: Completed  Updated: 05/18/21 1556                Ventricular Rate  118  BPM           Atrial Rate  37  BPM           QRS Duration  172  ms           Q-T Interval  406  ms           QTC Calculation (Bezet)  569  ms           Calculated R Axis  -70  degrees           Calculated T Axis  104  degrees                Diagnosis  --             Ventricular-paced rhythm   Abnormal ECG   Confirmed by Barry Dieneswens, M.D., Jonny RuizJohn ([id number]) on 05/18/2021 3:56:31 PM  EKG, 12 LEAD, INITIAL [[id number]]  Collected: 05/17/21 2134       Order Status: Completed  Updated: 05/18/21 1553                Ventricular Rate  107  BPM           Atrial Rate  107  BPM           P-R Interval  232  ms           QRS Duration  170  ms           Q-T Interval  362  ms           QTC Calculation (Bezet)  483  ms           Calculated R Axis  -71  degrees           Calculated T Axis  110  degrees                Diagnosis  --             Atrial-sensed ventricular-paced rhythm with prolonged AV conduction   Abnormal ECG   When compared with ECG of 11-May-2021 20:56,   Electronic ventricular pacemaker has replaced Electronic atrial pacemaker   Confirmed by Barry Dienes, M.D., John ([id number]) on 05/18/2021 3:53:20 PM              EKG, 12 LEAD, INITIAL [[id number]]  Collected: 05/11/21 2056       Order Status: Completed  Updated: 05/14/21 1706                Ventricular Rate  84  BPM           Atrial Rate  84  BPM           P-R Interval  168  ms           QRS Duration  162  ms           Q-T Interval  388  ms           QTC Calculation  (Bezet)  458  ms           Calculated P Axis  68  degrees           Calculated R Axis  -102  degrees           Calculated T Axis  11  degrees                Diagnosis  --             Sinus rhythm with frequent atrial-paced complexes and premature    supraventricular complexes in a pattern of bigeminy   Right bundle branch block   Septal infarct , age undetermined   T wave abnormality, consider lateral ischemia   Abnormal ECG      Confirmed by Barry Dienes, M.D., John ([id number]) on 05/14/2021 5:06:24 PM              EKG, 12 LEAD, INITIAL [[id number]]  Collected: 05/11/21 2005       Order Status: Completed  Updated: 05/14/21 1706                Ventricular Rate  116  BPM           Atrial Rate  116  BPM           P-R Interval  200  ms  QRS Duration  190  ms           Q-T Interval  368  ms           QTC Calculation (Bezet)  511  ms           Calculated R Axis  -72  degrees           Calculated T Axis  105  degrees                Diagnosis  --             Atrial-sensed ventricular-paced rhythm   Abnormal ECG   Confirmed by Barry Dienes, M.D., John ([id number]) on 05/14/2021 5:06:15 PM              EKG, 12 LEAD, INITIAL [[id number]]  Collected: 05/11/21 1531       Order Status: Completed  Updated: 05/14/21 1706                Ventricular Rate  83  BPM           Atrial Rate  83  BPM           P-R Interval  160  ms           QRS Duration  160  ms           Q-T Interval  368  ms           QTC Calculation (Bezet)  432  ms           Calculated P Axis  64  degrees           Calculated R Axis  -100  degrees                  Calculated T Axis  16  degrees                Diagnosis  --             Sinus rhythm with frequent atrial-paced complexes and premature    supraventricular complexes in a pattern of bigeminy   Right bundle branch block   Possible Lateral infarct , age undetermined   Abnormal ECG   Confirmed by Barry Dienes, M.D., John ([id number]) on 05/14/2021 5:06:02 PM              EKG 12 LEAD INITIAL [[id number]]  Collected: 05/10/21 0635       Order  Status: Completed  Updated: 05/12/21 1304                Ventricular Rate  80  BPM           Atrial Rate  60  BPM           P-R Interval  212  ms           QRS Duration  112  ms           Q-T Interval  526  ms           QTC Calculation (Bezet)  606  ms           Calculated P Axis  61  degrees           Calculated R Axis  -55  degrees           Calculated T Axis  -158  degrees                Diagnosis  --  Atrial-paced rhythm with prolonged AV conduction with frequent premature    ventricular complexes   Incomplete right bundle branch block   Left anterior fascicular block   Septal infarct , age undetermined   T wave abnormality, consider lateral ischemia   Prolonged QT   Abnormal ECG   When compared with ECG of 09-May-2021 20:16,   No significant change      Confirmed by Rathi MD, Vikas [phone]) on 05/12/2021 1:04:16 PM              EKG, 12 LEAD, INITIAL [[id number]]         Order Status: Sent         EKG 12 LEAD INITIAL [[id number]]  Collected: 05/09/21 2016       Order Status: Completed  Updated: 05/11/21 1349                Ventricular Rate  97  BPM           Atrial Rate  49  BPM           P-R Interval  160  ms           QRS Duration  160  ms           Q-T Interval  350  ms           QTC Calculation (Bezet)  444  ms           Calculated P Axis  67  degrees           Calculated R Axis  -117  degrees           Calculated T Axis  6  degrees                Diagnosis  --             Sinus bradycardia with marked sinus arrhythmia with frequent atrial-paced    complexes and with frequent and consecutive premature ventricular complexes   Right bundle branch block   Possible Lateral infarct , age undetermined   Abnormal ECG   When compared with ECG of 13-Apr-2021 22:14,   premature ventricular complexes are now present   premature atrial complexes are no longer present   Borderline criteria for Lateral infarct are now present   Confirmed by Rathi MD, Vikas ([id number]) on 05/11/2021 1:49:45 PM                            Review of Systems:      [x] All other systems reviewed and all negative except as written in HPI      []  Patient unable to provide secondary  to condition        Past Medical History:        Diagnosis  Date         ?  (HFpEF) heart failure with preserved ejection fraction (HCC)       ?  Anxiety and depression       ?  Aortic valve replaced            S/p bovine aortic valve replacement.         ?  Asthma       ?  Chronic narcotic use       ?  Chronic obstructive pulmonary disease (HCC)       ?  Chronic pain       ?  CKD (chronic  kidney disease), stage III (HCC)            Baseline creatinine is 1.3-1.4 with GFR in the 40s.         ?  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  GERD (gastroesophageal reflux disease)       ?  History of vascular access device  04/13/2021          4 FR Single PICC for LTABX: R cephalic vessell length 48 CM Max P leave @ 1 CM out; Arm circumferenc 40 CM         ?  Hyperlipidemia       ?  Hypothyroidism       ?  Morbid obesity (HCC)       ?  Neuropathy       ?  Obstructive sleep apnea           ?  Rhinitis            Past Surgical History:         Procedure  Laterality  Date          ?  COLONOSCOPY  N/A  12/01/2020          COLONOSCOPY performed by Ian Malkin, MD at [facility] ENDOSCOPY          ?  HX AORTIC VALVE REPLACEMENT              Bovine bioprosthetic          ?  HX PACEMAKER            Social Hx:  reports that she quit smoking about 11 years ago. Her smoking use included cigarettes. She has a 40.00 pack-year smoking history. She has never used smokeless tobacco. She reports that she does not currently  use alcohol. She reports that she does not use drugs.   Family Hx: family history includes Hypertension in her father and mother.     Allergies        Allergen  Reactions         ?  Nitroglycerin  Unknown (comments)             hypotension         ?  Aloe Vera  Rash     ?  Hydrochlorothiazide  Other (comments)             Reports 'kidneys dry up"          ?  Tetanus And Diphther. Tox  (Pf)  Swelling             Swelling of arm and it turns black              OBJECTIVE:     Wt Readings from Last 3 Encounters:        05/21/21  155.6 kg (343 lb)     04/24/21  (!) 160.8 kg (354 lb 8 oz)        03/09/21  149.7 kg (330 lb)           Intake/Output Summary (Last 24 hours) at 05/21/2021 0738   Last data filed at 05/21/2021 0736     Gross per 24 hour        Intake  1480 ml        Output  2750 ml        Net  -1270 ml  Physical Exam:      Vitals:      Vitals:             05/21/21 0557  05/21/21 0602  05/21/21 0606  05/21/21 0702           BP:  (!) 99/54  100/60  (!) 98/51  116/70     Pulse:             Resp:             Temp:             SpO2:             Weight:                   Height:                Telemetry: SR, paced w/ episodes of tachy        Gen: Morbidly obese, in no distress    Neck: Supple, No JVD, No Carotid Bruit   Resp: No accessory muscle use, diminished breath sounds, B/L Basal rales are present.    Card: Regular Rate,Rythm, Normal S1, S2, No murmurs, rubs or gallop.    Abd:   obese, Soft, non-tender, non-distended, BS+    MSK: No cyanosis   Skin: No rashes     Neuro: Moving all four extremities, follows commands appropriately   Psych: Fair insight, oriented to person, place, alert, Nml Affect   LE: 3+ edema      Data Review:       Radiology:    XR Results (most recent):   Results from Hospital Encounter encounter on 05/09/21      XR CHEST PORT      Narrative   EXAM:  XR CHEST PORT      INDICATION: Fever      COMPARISON: 05/14/2021      TECHNIQUE: Upright portable chest AP view 1047 hours      FINDINGS: The patient is on a cardiac monitor. There is no change in the   moderate cardiomegaly. Pacemaker remains in place. The lungs demonstrate   decrease in bilateral pulmonary edema. Mild areas of atelectasis are noted at   the lung bases.      Right-sided PICC line overlies the SVC.      Impression   1. The lungs demonstrate decreasing pulmonary edema           Recent Labs             05/21/21   0333  05/20/21   0251     NA  139  136     K  3.7  3.6     CL  99  97     CO2  36*  37*     BUN  40*  44*     CREA  1.21*  1.34*     GLU  119*  154*     PHOS  3.6   --          CA  9.0  8.9             Recent Labs            05/21/21   0333  05/20/21   0251     WBC  5.0  5.9     HGB  8.6*  8.0*     HCT  29.1*  26.9*  PLT  72*  85*           No results for input(s): PTP, INR, AP, INREXT, INREXT in the last 72 hours.      No lab exists for component: PTTP, GPT, SGOT      No results for input(s): CHOL, LDLC in the last 72 hours.      No lab exists for component: TGL, HDLC,  HBA1C         Current meds:      Current Facility-Administered Medications:    ?  meropenem (MERREM) 1 g in 0.9% sodium chloride (MBP/ADV) 50 mL MBP, 1 g, IntraVENous, Q8H, [name], MD, Last Rate: 16.7 mL/hr at 05/21/21 0330, 1 g at 05/21/21 0330   ?  L.acidophilus-paracasei-S.thermophil-bifidobacter (RISAQUAD) 8 billion cell capsule, 1 Capsule, Oral, DAILY, Do, Khoi B, MD   ?  PHENYLephrine (NEO-SYNEPHRINE) 50 mg in 0.9% sodium chloride 250 mL (Vial2Bag), 10-100 mcg/min, IntraVENous, TITRATE, Edwards, Sabrina C, NP, Stopped at 05/21/21 0555   ?  bumetanide (BUMEX) tablet 1 mg, 1 mg, Oral, BID, Bammes, Nathan O, DO, 1 mg at 05/20/21 1730   ?  predniSONE (DELTASONE) tablet 5 mg, 5 mg, Oral, DAILY WITH BREAKFAST, Bammes, Nathan O, DO, 5 mg at 05/20/21 0840   ?  albuterol-ipratropium (DUO-NEB) 2.5 MG-0.5 MG/3 ML, 3 mL, Nebulization, Q6H RT, Bammes, Nathan O, DO, 3 mL at 05/21/21 0718   ?  pantoprazole (PROTONIX) tablet 40 mg, 40 mg, Oral, ACB, Choudhary, Bhavana, MD, 40 mg at 05/21/21 0631   ?  heparin (porcine) injection 5,000 Units, 5,000 Units, SubCUTAneous, Q8H, Tawanna Sat, MD, 5,000 Units at 05/21/21 0981   ?  [Held by provider] metoprolol succinate (TOPROL-XL) XL tablet 25 mg, 25 mg, Oral, DAILY, Fisher, Georga Hacking, NP, 25 mg at 05/16/21 0847   ?  acetaZOLAMIDE (DIAMOX) tablet 250 mg, 250 mg, Oral, BID, Melynda Keller, MD, 250 mg at 05/19/21 1700   ?  nystatin (MYCOSTATIN) 100,000 unit/gram cream, , Topical, BID, Marca Ancona C, NP, Given at 05/20/21 1730   ?  traZODone (DESYREL) tablet 100 mg, 100 mg, Oral, QHS, Horald Chestnut, DO, 100 mg at 05/20/21 2157   ?  oxyCODONE IR (ROXICODONE) tablet 5 mg, 5 mg, Oral, Q6H PRN, Horald Chestnut, DO, 5 mg at 05/19/21 2028   ?  albuterol-ipratropium (DUO-NEB) 2.5 MG-0.5 MG/3 ML, 3 mL, Nebulization, Q4H PRN, Horald Chestnut, DO, 3 mL at 05/15/21 2003   ?  insulin lispro (HUMALOG) injection, , SubCUTAneous, AC&HS, Horald Chestnut, DO, 2 Units at 05/20/21 1730   ?  glucose chewable tablet 16 g, 4 Tablet, Oral, PRN, Horald Chestnut, DO   ?  glucagon (GLUCAGEN) injection 1 mg, 1 mg, IntraMUSCular, PRN, Jamil, Arna Medici, DO   ?  dextrose 10% infusion 0-250 mL, 0-250 mL, IntraVENous, PRN, Horald Chestnut, DO   ?  [Held by provider] arformoteroL (BROVANA) neb solution 15 mcg, 15 mcg, Nebulization, BID RT, Jamil, Arna Medici, DO, 15 mcg at 05/17/21 0745   ?  budesonide (PULMICORT) 500 mcg/2 ml nebulizer suspension, 500 mcg, Nebulization, BID RT, Horald Chestnut, DO, 500 mcg at 05/21/21 1914   ?  sodium chloride (NS) flush 5-40 mL, 5-40 mL, IntraVENous, Q8H, Melynda Keller, MD, 10 mL at 05/21/21 7829   ?  sodium chloride (NS) flush 5-40 mL, 5-40 mL, IntraVENous, PRN, Melynda Keller, MD   ?  acetaminophen (TYLENOL) tablet 650 mg, 650 mg, Oral, Q6H PRN, 650 mg at 05/20/21 2157 **OR** acetaminophen (  TYLENOL) suppository 650 mg, 650 mg, Rectal, Q6H PRN, Melynda Keller, MD   ?  polyethylene glycol (MIRALAX) packet 17 g, 17 g, Oral, DAILY PRN, Melynda Keller, MD   ?  senna (SENOKOT) tablet 8.6 mg, 1 Tablet, Oral, DAILY PRN, Melynda Keller, MD, 8.6 mg at 05/19/21 1700   ?  promethazine (PHENERGAN) tablet 12.5 mg, 12.5 mg, Oral, Q6H PRN **OR** ondansetron (ZOFRAN) injection 4 mg, 4 mg, IntraVENous, Q6H PRN, Melynda Keller, MD, 4 mg at 05/17/21 0956   ?  aspirin chewable tablet 81 mg, 81 mg, Oral, DAILY, Melynda Keller,  MD, 81 mg at 05/20/21 0840   ?  atorvastatin (LIPITOR) tablet 10 mg, 10 mg, Oral, DAILY, Melynda Keller, MD, 10 mg at 05/20/21 0840   ?  citalopram (CELEXA) tablet 40 mg, 40 mg, Oral, DAILY, Melynda Keller, MD, 40 mg at 05/20/21 0840   ?  [Held by provider] glipiZIDE (GLUCOTROL) tablet 5 mg, 5 mg, Oral, BID WITH MEALS, Melynda Keller, MD, 5 mg at 05/17/21 0805   ?  levothyroxine (SYNTHROID) tablet 150 mcg, 150 mcg, Oral, ACB, Melynda Keller, MD, 150 mcg at 05/21/21 0631   ?  montelukast (SINGULAIR) tablet 10 mg, 10 mg, Oral, QHS, Melynda Keller, MD, 10 mg at 05/20/21 2157   ?  sucralfate (CARAFATE) tablet 1 g, 1 g, Oral, TID, Melynda Keller, MD, 1 g at 05/20/21 2157      Vincent Peyer, NP      [facility] Cardiology   Call center: (P) [phone]  (F) [phone]         CC:Barrett, Lance Sell, NP

## 2021-05-21 NOTE — Progress Notes (Signed)
Physical Therapy Note:    PT treatment deferred.     10:00 RN reports pt with unstable BP intermittently below goal.     1315 OT reports pt with symptomatic orthostatic hypotension and poor tolerance to activity. Pt unable to participate in PT treatment.    Will continue to follow per POC.    Donaciano Eva, PT, DPT, Ocie Doyne

## 2021-05-21 NOTE — Progress Notes (Signed)
Problem: Self Care Deficits Care Plan (Adult)  Goal: *Acute Goals and Plan of Care (Insert Text)  Description: FUNCTIONAL STATUS PRIOR TO ADMISSION: Patient was modified independent using a single point cane for functional mobility. She reports able to care for self prior to recent hospitalization and rehab stay.    HOME SUPPORT: The patient lived with son and daughter in law but did not require assist.    Occupational Therapy Goals  Initiated 05/13/2021, Reviewed for weekly assessment, goals remain appropriate  1.  Patient will perform lower body dressing with modified independence within 7 day(s).  2.  Patient will perform lower body dressing with modified independence within 7 day(s).  3.  Patient will perform toilet transfers with modified independence within 7 day(s).  4.  Patient will perform all aspects of toileting with modified independence within 7 day(s).  5.  Patient will participate in upper extremity therapeutic exercise/activities with supervision/set-up for 10 minutes within 7 day(s).    6.  Patient will utilize energy conservation techniques during functional activities with verbal cues within 7 day(s).    Outcome: Progressing Towards Goal   OCCUPATIONAL THERAPY TREATMENT  Patient: Deborah [name] (66 y.o. female)  Date: 05/21/2021  Diagnosis: Acute on chronic heart failure with preserved ejection fraction (HFpEF) (HCC) [I50.33] Acute on chronic heart failure with preserved ejection fraction (HFpEF) (HCC)      Precautions: Fall  Chart, occupational therapy assessment, plan of care, and goals were reviewed.    ASSESSMENT  Patient continues with skilled OT services and is progressing slowly towards goals.  Deborah [name] was received in the chair agreeable to OT interventions.  She was agreeable to ADL retraining but requesting to go into the bathroom for grooming tasks.  Patient required CGA for short distance transfer to the bathroom and required seated rest break.  With prompting, patient reporting mild  dizziness.  BP low throughout and dizziness associated with even further drop with positional changes/following activity.  Patient engaged in serial ADL tasks while sated at the sink with SBA.  Education provided regarding safe transfer techniques, adaptive ADL techniques, and energy conservation techniques including pacing.  Patient returned to the chair, LE elevated, and BP improved with prolonged rest break.  Patient would benefit from continued skilled OT to progress towards goals and improve overall independence.     Patient Vitals  during OT tx:   Position Pulse BP SpO2   05/21/21 1215 Sitting; post tx 80 117/61 100 %   05/21/21 1214 Sitting 81 114/60 100 %   05/21/21 1213 Sitting 83 (!) 87/66 95 %   05/21/21 1211 Sitting; post return to transfer to the chair (stand-pivot transfer)- reporting mild dizziness -- (!) 80/60    05/21/21 1202 Sitting; post short distance transfer; reporting mild dizziness 85 (!) 98/55 99 %           Current Level of Function Impacting Discharge (ADLs): Functional mobility, CGA; Grooming (standing at the sink), SBA           PLAN :  Patient continues to benefit from skilled intervention to address the above impairments.  Continue treatment per established plan of care to address goals.    Recommend with staff:   Recommend patient be OOB to chair as frequently as tolerated; Goal of 3x/day for all meals for 60 minutes at a time.   For toileting needs, recommend transfers to/from [facility] with staff assist with close BP monitoring.  Encourage patient involvement in personal care as able.  Recommend next OT session: Continue towards set OT goals.      Recommendation for discharge: (in order for the patient to meet his/her long term goals)  Occupational therapy at least 2 days/week in the home with supervision/assistance provided by family     This discharge recommendation:  Has been made in collaboration with the attending provider and/or case management    IF patient discharges home will  need the following DME: none       SUBJECTIVE:   Patient agreeable to OT tx.    OBJECTIVE DATA SUMMARY:   Cognitive/Behavioral Status:  Neurologic State: Alert  Orientation Level: Oriented X4  Cognition: Follows commands  Perception: Appears intact  Perseveration: No perseveration noted  Safety/Judgement: Awareness of environment    Functional Mobility and Transfers for ADLs:  Bed Mobility:  Rolling:  (pt was received up and remained up)  Scooting: Supervision    Transfers:  Sit to Stand: Contact guard assistance  Bed to Chair: Contact guard assistance    Balance:  Sitting: Intact  Standing: Impaired  Standing - Static: Good  Standing - Dynamic : Fair    ADL Intervention:  Grooming  Grooming Assistance: Stand-by assistance  Position Performed:  (seated at sink)  Washing Face: Stand-by assistance  Washing Hands: Stand-by assistance  Brushing Teeth: Stand-by assistance  Brushing/Combing Hair: Stand-by assistance  Cues: Verbal cues provided    Cognitive Retraining  Safety/Judgement: Awareness of environment    Activity Tolerance:   Good    After treatment patient left in no apparent distress:   Sitting in chair, Call bell within reach, and Bed / chair alarm activated    COMMUNICATION/COLLABORATION:   The patient's plan of care was discussed with: Physical therapist, Registered nurse, and patient .     Waunita Schooner, OTR/L  Time Calculation: 39 mins

## 2021-05-21 NOTE — Progress Notes (Signed)
Transition of Care Plan: RUR-30%, LOS 12 days  Medical management continues  PT/OT following-recommending hh--set up with [facility] on IV abx, ceftriaxone through 4/26  Cardiology following  Dietitian following  Currently on 2L O2 nc  CM following for d/c needs  L.Olive Bass, RN

## 2021-05-21 NOTE — Progress Notes (Signed)
Comprehensive Nutrition Assessment    Type and Reason for Visit: Reassess    Nutrition Recommendations/Plan:   Modify diet: Regular, 4 Carb Choice, 2 gm Na diet order  FR 1500 per MD  Provide Gelatein once daily to increase kcal/protein intake (80 kcal, <1 g carbs, 20 g protein)   Provide Ensure Pudding once daily to aid in kcal/protein intake (240 kcal, 24 g carbs, 12 g protein)     Malnutrition Assessment:  Malnutrition Status:  Mild malnutrition (05/21/21 1221)    Context:  Chronic illness     Findings of the 6 clinical characteristics of malnutrition:   Energy Intake:  Mild decrease in energy intake (specify)  Weight Loss:  No significant weight loss     Body Fat Loss:  No significant body fat loss,     Muscle Mass Loss:  No significant muscle mass loss,    Fluid Accumulation:  Severe, Generalized, Extremities  Grip Strength:  Not performed     Nutrition Assessment:     4/24: Follow up. Per documentation, patient continues to have good appetite with PO intake averaging 75% of most meals. No ONS intake has been documented. Edema has improved, and weight trend reflects this. Noted ID consult. Off pressors at this time. Patient voices hunger; displeased she is on carbohydrate restriction. Discussed with patient blood sugars 2/2 prednisone, patient voiced understanding. Will liberalize to 4 carb choice diet in order to provide sufficient calories to patient. Doing "alright" with ONS intake per her report.       Documented Meal intake:  Patient Vitals for the past 168 hrs:   % Diet Eaten   05/19/21 1654 76 - 100%   05/19/21 1400 51 - 75%   05/19/21 0845 76 - 100%   05/17/21 1407 26 - 50%   05/16/21 1800 51 - 75%   05/15/21 1800 76 - 100%   05/15/21 1300 76 - 100%   05/15/21 0900 76 - 100%   05/14/21 1529 76 - 100%       Documentation of supplement intake:  Patient Vitals for the past 168 hrs:   Supplement intake %   05/17/21 1407 0%   05/16/21 1800 0%         Last 3 Recorded Weights in this Encounter    05/18/21 0530  05/20/21 0430 05/21/21 0400   Weight: (!) 160.7 kg (354 lb 4.5 oz) 156.5 kg (345 lb 1.6 oz) 155.6 kg (343 lb)       Nutrition Related Findings:      Wound Type: None (none noted on flowsheets)  Last Bowel Movement Date: 05/18/21  Stool Appearance: Formed, Hard  Abdominal Assessment: Intact, Obese  Appetite: Good  Bowel Sounds: Active   Edema:Generalized: Trace (05/21/2021  8:38 AM)  LLE: 2+ (05/21/2021  8:38 AM)  LUE: Trace (05/20/2021  8:09 AM)  RLE: 2+ (05/21/2021  8:38 AM)  RUE: Trace (05/20/2021  8:09 AM)      Nutr. Labs:  Lab Results   Component Value Date/Time    GFR est AA 56 (L) 08/26/2020 04:25 PM    GFR est non-AA 46 (L) 08/26/2020 04:25 PM    Creatinine 1.21 (H) 05/21/2021 03:33 AM    BUN 40 (H) 05/21/2021 03:33 AM    Sodium 139 05/21/2021 03:33 AM    Potassium 3.7 05/21/2021 03:33 AM    Chloride 99 05/21/2021 03:33 AM    CO2 36 (H) 05/21/2021 03:33 AM       Lab Results   Component  Value Date/Time    Glucose 119 (H) 05/21/2021 03:33 AM    Glucose (POC) 169 (H) 05/21/2021 10:58 AM       Lab Results   Component Value Date/Time    Hemoglobin A1c 5.7 (H) 05/21/2021 03:33 AM    Hemoglobin A1c, External 6.1 07/15/2020 12:00 AM       Magnesium   Date Value Ref Range Status   05/21/2021 2.2 1.6 - 2.4 mg/dL Final   05/20/2021 2.3 1.6 - 2.4 mg/dL Final   05/17/2021 1.9 1.6 - 2.4 mg/dL Final   05/16/2021 2.1 1.6 - 2.4 mg/dL Final   05/15/2021 1.8 1.6 - 2.4 mg/dL Final       Lab Results   Component Value Date/Time    Calcium 9.0 05/21/2021 03:33 AM    Phosphorus 3.6 05/21/2021 03:33 AM       Lab Results   Component Value Date/Time    Cholesterol, total 106 04/30/2020 12:57 AM    HDL Cholesterol 76 04/30/2020 12:57 AM    LDL, calculated 12.6 04/30/2020 12:57 AM    VLDL, calculated 17.4 04/30/2020 12:57 AM    Triglyceride 87 04/30/2020 12:57 AM    CHOL/HDL Ratio 1.4 04/30/2020 12:57 AM         Nutr. Meds:  Lipitor, bumex, glipizide (held), glucagon PRN, heparin, humalog, risaquad, synthroid, Merrem, Toprol-XL, Singulair,  zofran PRN, Protonix, miralax PRN, senna, carafate      Current Nutrition Intake & Therapies:  Average Meal Intake: 51-75%  Average Supplement Intake: 26-50%  ADULT ORAL NUTRITION SUPPLEMENT Lunch; Protein Modular  ADULT ORAL NUTRITION SUPPLEMENT HS Snack; Fortified Pudding  ADULT DIET Regular; 3 carb choices (45 gm/meal); Low Sodium (2 gm); 1500 ml  DIET ONE TIME MESSAGE    Anthropometric Measures:  Height: '5\' 7"'$  (170.2 cm)  Ideal Body Weight (IBW): 135 lbs (61 kg)  Admission Body Weight: 354 lb  Current Body Wt:  155.6 kg (343 lb 0.6 oz), 254.1 % IBW. Standing scale  Current BMI (kg/m2): 53.7  Usual Body Weight: 145.2 kg (320 lb)  % Weight Change (Calculated): 11.2  Weight Adjustment: No adjustment                 BMI Category: Obese class 3 (BMI 40.0 or greater)    Estimated Daily Nutrient Needs:  Energy Requirements Based On: Formula  Weight Used for Energy Requirements: Current  Energy (kcal/day): 2354 (MSJ x 1.1)  Weight Used for Protein Requirements: Ideal  Protein (g/day): 92 - 122 g (1.5-2 g/kg of IBW)  Method Used for Fluid Requirements: 1 ml/kcal  Fluid (ml/day): 6295    Nutrition Diagnosis:   Inadequate protein intake related to biting/chewing (masticatory) difficulty, altered taste perception as evidenced by localized or generalized fluid accumulation, lab values, poor dentition    Nutrition Interventions:   Food and/or Nutrient Delivery: Modify current diet, Continue oral nutrition supplement  Nutrition Education/Counseling: Survival skills/brief education completed  Coordination of Nutrition Care: Continue to monitor while inpatient, Interdisciplinary rounds  Plan of Care discussed with: IDR team    Goals:  Previous Goal Met: Goal(s) achieved  Goals: Meet at least 75% of estimated needs, by next RD assessment       Nutrition Monitoring and Evaluation:   Behavioral-Environmental Outcomes: None identified  Food/Nutrient Intake Outcomes: Food and nutrient intake, Supplement intake  Physical  Signs/Symptoms Outcomes: Biochemical data, Weight, Fluid status or edema    Discharge Planning:    Continue oral nutrition supplement    Ola Spurr, MS, RD  Contact: Ext: [phone], or via [employer]

## 2021-05-22 ENCOUNTER — Inpatient Hospital Stay: Admit: 2021-05-22 | Payer: MEDICARE | Primary: Family

## 2021-05-22 LAB — CBC W/O DIFF
ABSOLUTE NRBC: 0 10*3/uL (ref 0.00–0.01)
HCT: 32 % — ABNORMAL LOW (ref 35.0–47.0)
HGB: 9.6 g/dL — ABNORMAL LOW (ref 11.5–16.0)
MCH: 27.9 PG (ref 26.0–34.0)
MCHC: 30 g/dL (ref 30.0–36.5)
MCV: 93 FL (ref 80.0–99.0)
MPV: 11.1 FL (ref 8.9–12.9)
NRBC: 0 PER 100 WBC
PLATELET: 114 10*3/uL — ABNORMAL LOW (ref 150–400)
RBC: 3.44 M/uL — ABNORMAL LOW (ref 3.80–5.20)
RDW: 16.8 % — ABNORMAL HIGH (ref 11.5–14.5)
WBC: 10 10*3/uL (ref 3.6–11.0)

## 2021-05-22 LAB — BLOOD GAS, ARTERIAL
BASE EXCESS: 7.5 mmol/L
BICARBONATE: 34 mmol/L — ABNORMAL HIGH (ref 22–26)
O2 FLOW RATE: 3 L/min
O2 SAT: 99 % — ABNORMAL HIGH (ref 92–97)
PCO2: 53 mmHg — ABNORMAL HIGH (ref 35–45)
PO2: 137 mmHg — ABNORMAL HIGH (ref 80–100)
pH: 7.42 (ref 7.35–7.45)

## 2021-05-22 LAB — CULTURE, BLOOD, PAIRED
Culture result:: NO GROWTH
Culture: NO GROWTH

## 2021-05-22 LAB — PHOSPHORUS
Phosphorus: 3 MG/DL (ref 2.6–4.7)
Phosphorus: 3 MG/DL (ref 2.6–4.7)

## 2021-05-22 LAB — METABOLIC PANEL, BASIC
Anion gap: 0 mmol/L — ABNORMAL LOW (ref 5–15)
BUN/Creatinine ratio: 28 — ABNORMAL HIGH (ref 12–20)
BUN: 35 MG/DL — ABNORMAL HIGH (ref 6–20)
CO2: 36 mmol/L — ABNORMAL HIGH (ref 21–32)
Calcium: 8.9 MG/DL (ref 8.5–10.1)
Chloride: 101 mmol/L (ref 97–108)
Creatinine: 1.23 MG/DL — ABNORMAL HIGH (ref 0.55–1.02)
Glucose: 130 mg/dL — ABNORMAL HIGH (ref 65–100)
Potassium: 4.1 mmol/L (ref 3.5–5.1)
Sodium: 137 mmol/L (ref 136–145)
eGFR: 49 mL/min/{1.73_m2} — ABNORMAL LOW (ref >60)

## 2021-05-22 LAB — NT-PRO BNP: NT pro-BNP: 4152 PG/ML — ABNORMAL HIGH (ref <125)

## 2021-05-22 LAB — GLUCOSE, POC
Glucose (POC): 145 mg/dL — ABNORMAL HIGH (ref 65–117)
Glucose (POC): 123 mg/dL — ABNORMAL HIGH (ref 65–117)
Glucose (POC): 215 mg/dL — ABNORMAL HIGH (ref 65–117)
Glucose (POC): 197 mg/dL — ABNORMAL HIGH (ref 65–117)

## 2021-05-22 LAB — MAGNESIUM
Magnesium: 2.2 mg/dL (ref 1.6–2.4)
Magnesium: 2.2 mg/dL (ref 1.6–2.4)

## 2021-05-22 LAB — CORTISOL
Cortisol, random: 3.7 ug/dL
Cortisol: 3.7 ug/dL

## 2021-05-22 LAB — ARTERIAL BLOOD GASES
Base Excess: 7.5 mmol/L
HCO3: 34 mmol/L — ABNORMAL HIGH (ref 22–26)
LITERS PER MINUTE: 3 L/min
O2 Sat: 99 % — ABNORMAL HIGH (ref 92–97)
PCO2: 53 mmHg — ABNORMAL HIGH (ref 35–45)
PO2: 137 mmHg — ABNORMAL HIGH (ref 80–100)
pH: 7.42 (ref 7.35–7.45)

## 2021-05-22 LAB — POCT GLUCOSE
POC Glucose: 145 mg/dL — ABNORMAL HIGH (ref 65–117)
POC Glucose: 123 mg/dL — ABNORMAL HIGH (ref 65–117)
POC Glucose: 215 mg/dL — ABNORMAL HIGH (ref 65–117)
POC Glucose: 197 mg/dL — ABNORMAL HIGH (ref 65–117)

## 2021-05-22 LAB — CBC
Hematocrit: 32 % — ABNORMAL LOW (ref 35.0–47.0)
Hemoglobin: 9.6 g/dL — ABNORMAL LOW (ref 11.5–16.0)
MCH: 27.9 PG (ref 26.0–34.0)
MCHC: 30 g/dL (ref 30.0–36.5)
MCV: 93 FL (ref 80.0–99.0)
MPV: 11.1 FL (ref 8.9–12.9)
NRBC Absolute: 0 10*3/uL (ref 0.00–0.01)
Nucleated RBCs: 0 PER 100 WBC
Platelets: 114 10*3/uL — ABNORMAL LOW (ref 150–400)
RBC: 3.44 M/uL — ABNORMAL LOW (ref 3.80–5.20)
RDW: 16.8 % — ABNORMAL HIGH (ref 11.5–14.5)
WBC: 10 10*3/uL (ref 3.6–11.0)

## 2021-05-22 LAB — BASIC METABOLIC PANEL
Anion Gap: 0 mmol/L — ABNORMAL LOW (ref 5–15)
BUN: 35 MG/DL — ABNORMAL HIGH (ref 6–20)
Bun/Cre Ratio: 28 — ABNORMAL HIGH (ref 12–20)
CO2: 36 mmol/L — ABNORMAL HIGH (ref 21–32)
Calcium: 8.9 MG/DL (ref 8.5–10.1)
Chloride: 101 mmol/L (ref 97–108)
Creatinine: 1.23 MG/DL — ABNORMAL HIGH (ref 0.55–1.02)
ESTIMATED GLOMERULAR FILTRATION RATE: 49 mL/min/{1.73_m2} — ABNORMAL LOW (ref >60)
Glucose: 130 mg/dL — ABNORMAL HIGH (ref 65–100)
Potassium: 4.1 mmol/L (ref 3.5–5.1)
Sodium: 137 mmol/L (ref 136–145)

## 2021-05-22 LAB — PROBNP, N-TERMINAL: BNP: 4152 PG/ML — ABNORMAL HIGH (ref <125)

## 2021-05-22 MED ORDER — MIDODRINE 5 MG TAB
5 mg | Freq: Three times a day (TID) | ORAL | Status: AC
Start: 2021-05-22 — End: 2021-05-29
  Administered 2021-05-22 – 2021-05-29 (×22): via ORAL

## 2021-05-22 MED ORDER — AMIODARONE 200 MG TAB
200 mg | Freq: Two times a day (BID) | ORAL | Status: AC
Start: 2021-05-22 — End: 2021-05-29
  Administered 2021-05-22 – 2021-05-29 (×14): via ORAL

## 2021-05-22 MED ORDER — PERFLUTREN LIPID MICROSPHERES 1.1 MG/ML IV
1.1 mg/mL | Freq: Once | INTRAVENOUS | Status: AC
Start: 2021-05-22 — End: 2021-05-22
  Administered 2021-05-22: 23:00:00 via INTRAVENOUS

## 2021-05-22 MED ORDER — IRON SUCROSE 100 MG/5 ML IV SOLN
100 mg iron/5 mL | Freq: Once | INTRAVENOUS | Status: AC
Start: 2021-05-22 — End: 2021-05-22
  Administered 2021-05-22: 12:00:00 via INTRAVENOUS

## 2021-05-22 MED FILL — MEROPENEM 1 GRAM IV SOLR: 1 gram | INTRAVENOUS | Qty: 1

## 2021-05-22 MED FILL — IPRATROPIUM-ALBUTEROL 2.5 MG-0.5 MG/3 ML NEB SOLUTION: 2.5 mg-0.5 mg/3 ml | RESPIRATORY_TRACT | Qty: 3

## 2021-05-22 MED FILL — VENOFER 100 MG IRON/5 ML INTRAVENOUS SOLUTION: 100 mg iron/5 mL | INTRAVENOUS | Qty: 10

## 2021-05-22 MED FILL — MIDODRINE 5 MG TAB: 5 mg | ORAL | Qty: 1

## 2021-05-22 MED FILL — INSULIN LISPRO 100 UNIT/ML INJECTION: 100 unit/mL | SUBCUTANEOUS | Qty: 2

## 2021-05-22 MED FILL — LEVOTHYROXINE 150 MCG TAB: 150 mcg | ORAL | Qty: 1

## 2021-05-22 MED FILL — CARAFATE 1 GRAM TABLET: 1 gram | ORAL | Qty: 1

## 2021-05-22 MED FILL — BUMETANIDE 1 MG TAB: 1 mg | ORAL | Qty: 1

## 2021-05-22 MED FILL — INSULIN LISPRO 100 UNIT/ML INJECTION: 100 unit/mL | SUBCUTANEOUS | Qty: 3

## 2021-05-22 MED FILL — ASPIRIN 81 MG CHEWABLE TAB: 81 mg | ORAL | Qty: 1

## 2021-05-22 MED FILL — HEPARIN (PORCINE) 5,000 UNIT/ML IJ SOLN: 5000 unit/mL | INTRAMUSCULAR | Qty: 1

## 2021-05-22 MED FILL — DEFINITY 1.1 MG/ML INTRAVENOUS SUSPENSION: 1.1 mg/mL | INTRAVENOUS | Qty: 2

## 2021-05-22 MED FILL — ACETAZOLAMIDE 250 MG TAB: 250 mg | ORAL | Qty: 1

## 2021-05-22 MED FILL — ATORVASTATIN 10 MG TAB: 10 mg | ORAL | Qty: 1

## 2021-05-22 MED FILL — RISAQUAD 8 BILLION CELL CAPSULE: 8 billion cell | ORAL | Qty: 1

## 2021-05-22 MED FILL — PHENYLEPHRINE 10 MG/ML INJECTION: 10 mg/mL | INTRAMUSCULAR | Qty: 5

## 2021-05-22 MED FILL — BUDESONIDE 0.5 MG/2 ML NEB SUSPENSION: 0.5 mg/2 mL | RESPIRATORY_TRACT | Qty: 1

## 2021-05-22 MED FILL — MIDODRINE 5 MG TAB: 5 mg | ORAL | Qty: 2

## 2021-05-22 MED FILL — VAZCULEP 10 MG/ML INJECTION SOLUTION: 10 mg/mL | INTRAMUSCULAR | Qty: 5

## 2021-05-22 MED FILL — PANTOPRAZOLE 40 MG TAB, DELAYED RELEASE: 40 mg | ORAL | Qty: 1

## 2021-05-22 MED FILL — OXYCODONE 5 MG TAB: 5 mg | ORAL | Qty: 1

## 2021-05-22 MED FILL — MONTELUKAST 10 MG TAB: 10 mg | ORAL | Qty: 1

## 2021-05-22 MED FILL — CITALOPRAM 20 MG TAB: 20 mg | ORAL | Qty: 2

## 2021-05-22 MED FILL — AMIODARONE 200 MG TAB: 200 mg | ORAL | Qty: 2

## 2021-05-22 MED FILL — TRAZODONE 50 MG TAB: 50 mg | ORAL | Qty: 2

## 2021-05-22 NOTE — Progress Notes (Signed)
Problem: Pressure Injury - Risk of  Goal: *Prevention of pressure injury  Description: Document Braden Scale and appropriate interventions in the flowsheet.  Outcome: Progressing Towards Goal  Note: Pressure Injury Interventions:  Sensory Interventions: Assess changes in LOC, Assess need for specialty bed, Avoid rigorous massage over bony prominences, Keep linens dry and wrinkle-free, Maintain/enhance activity level, Minimize linen layers, Turn and reposition approx. every two hours (pillows and wedges if needed)    Moisture Interventions: Absorbent underpads, Check for incontinence Q2 hours and as needed, Internal/External urinary devices, Minimize layers, Offer toileting Q_hr    Activity Interventions: Assess need for specialty bed, Pressure redistribution bed/mattress(bed type)    Mobility Interventions: Assess need for specialty bed, Pressure redistribution bed/mattress (bed type), Turn and reposition approx. every two hours(pillow and wedges)    Nutrition Interventions: Document food/fluid/supplement intake    Friction and Shear Interventions: Transferring/repositioning devices, Minimize layers, Apply protective barrier, creams and emollients                Problem: Patient Education: Go to Patient Education Activity  Goal: Patient/Family Education  Outcome: Progressing Towards Goal     Problem: Falls - Risk of  Goal: *Absence of Falls  Description: Document Schmid Fall Risk and appropriate interventions in the flowsheet.  Outcome: Progressing Towards Goal  Note: Fall Risk Interventions:  Mobility Interventions: Assess mobility with egress test, Bed/chair exit alarm, Communicate number of staff needed for ambulation/transfer, OT consult for ADLs, Patient to call before getting OOB, PT Consult for mobility concerns, PT Consult for assist device competence, Strengthening exercises (ROM-active/passive), Utilize walker, cane, or other assistive device, Utilize gait belt for transfers/ambulation         Medication  Interventions: Bed/chair exit alarm, Evaluate medications/consider consulting pharmacy, Patient to call before getting OOB, Teach patient to arise slowly, Utilize gait belt for transfers/ambulation    Elimination Interventions: Bed/chair exit alarm, Call light in reach, Elevated toilet seat, Patient to call for help with toileting needs, Stay With Me (per policy), Toilet paper/wipes in reach, Toileting schedule/hourly rounds    History of Falls Interventions: Bed/chair exit alarm, Door open when patient unattended, Evaluate medications/consider consulting pharmacy, Room close to nurse's station, Utilize gait belt for transfer/ambulation, Vital signs minimum Q4HRs X 24 hrs (comment for end date)         Problem: Patient Education: Go to Patient Education Activity  Goal: Patient/Family Education  Outcome: Progressing Towards Goal     Problem: Breathing Pattern - Ineffective  Goal: *Absence of hypoxia  Outcome: Progressing Towards Goal  Goal: *Use of effective breathing techniques  Outcome: Progressing Towards Goal     Problem: Patient Education: Go to Patient Education Activity  Goal: Patient/Family Education  Outcome: Progressing Towards Goal

## 2021-05-22 NOTE — Progress Notes (Signed)
0700: Bedside shift change report given to Tiffany, Therapist, sports (Soil scientist) by Junie Panning, RN (offgoing nurse). Report included the following information SBAR, Kardex, ED Summary, Procedure Summary, Intake/Output, MAR, Recent Results, and Med Rec Status.

## 2021-05-22 NOTE — Progress Notes (Signed)
Problem: Ischemic Stroke: Discharge Outcomes  Goal: *Verbalizes anxiety and depression are reduced or absent  05/22/2021 0018 by Adline Mango, RN  Outcome: Progressing Towards Goal  05/22/2021 0013 by Adline Mango, RN  Outcome: Progressing Towards Goal  Goal: *Verbalize understanding of risk factor modification(Stroke Metric)  05/22/2021 0018 by Adline Mango, RN  Outcome: Progressing Towards Goal  05/22/2021 0013 by Adline Mango, RN  Outcome: Progressing Towards Goal  Goal: *Hemodynamically stable  05/22/2021 0018 by Adline Mango, RN  Outcome: Progressing Towards Goal  05/22/2021 0013 by Adline Mango, RN  Outcome: Progressing Towards Goal  Goal: *Absence of aspiration pneumonia  05/22/2021 0018 by Adline Mango, RN  Outcome: Progressing Towards Goal  05/22/2021 0013 by Adline Mango, RN  Outcome: Progressing Towards Goal  Goal: *Aware of needed dietary changes  05/22/2021 0018 by Adline Mango, RN  Outcome: Progressing Towards Goal  05/22/2021 0013 by Adline Mango, RN  Outcome: Progressing Towards Goal  Goal: *Verbalize understanding of prescribed medications including anti-coagulants, anti-lipid, and/or anti-platelets(Stroke Metric)  05/22/2021 0018 by Adline Mango, RN  Outcome: Progressing Towards Goal  05/22/2021 0013 by Adline Mango, RN  Outcome: Progressing Towards Goal  Goal: *Tolerating diet  05/22/2021 0018 by Adline Mango, RN  Outcome: Progressing Towards Goal  05/22/2021 0013 by Adline Mango, RN  Outcome: Progressing Towards Goal  Goal: *Aware of follow-up diagnostics related to anticoagulants  05/22/2021 0018 by Adline Mango, RN  Outcome: Progressing Towards Goal  05/22/2021 0013 by Adline Mango, RN  Outcome: Progressing Towards Goal  Goal: *Ability to perform ADLs and demonstrates progressive mobility and function  05/22/2021 0018 by Adline Mango, RN  Outcome: Progressing Towards Goal  05/22/2021 0013 by Adline Mango, RN  Outcome: Progressing Towards Goal  Goal: *Absence of DVT(Stroke Metric)  05/22/2021 0018 by Adline Mango, RN  Outcome:  Progressing Towards Goal  05/22/2021 0013 by Adline Mango, RN  Outcome: Progressing Towards Goal  Goal: *Absence of aspiration  05/22/2021 0018 by Adline Mango, RN  Outcome: Progressing Towards Goal  05/22/2021 0013 by Adline Mango, RN  Outcome: Progressing Towards Goal  Goal: *Optimal pain control at patient's stated goal  05/22/2021 0018 by Adline Mango, RN  Outcome: Progressing Towards Goal  05/22/2021 0013 by Adline Mango, RN  Outcome: Progressing Towards Goal  Goal: *Home safety concerns addressed  05/22/2021 0018 by Adline Mango, RN  Outcome: Progressing Towards Goal  05/22/2021 0013 by Adline Mango, RN  Outcome: Progressing Towards Goal  Goal: *Describes available resources and support systems  05/22/2021 0018 by Adline Mango, RN  Outcome: Progressing Towards Goal  05/22/2021 0013 by Adline Mango, RN  Outcome: Progressing Towards Goal  Goal: *Verbalizes understanding of activation of EMS(911) for stroke symptoms(Stroke Metric)  05/22/2021 0018 by Adline Mango, RN  Outcome: Progressing Towards Goal  05/22/2021 0013 by Adline Mango, RN  Outcome: Progressing Towards Goal  Goal: *Understands and describes signs and symptoms to report to providers(Stroke Metric)  05/22/2021 0018 by Adline Mango, RN  Outcome: Progressing Towards Goal  05/22/2021 0013 by Adline Mango, RN  Outcome: Progressing Towards Goal  Goal: *Neurolgocially stable (absence of additional neurological deficits)  05/22/2021 0018 by Adline Mango, RN  Outcome: Progressing Towards Goal  05/22/2021 0013 by Adline Mango, RN  Outcome: Progressing Towards Goal  Goal: [place]on Gammon importance of follow-up with primary care physician(Stroke Metric)  05/22/2021 0018 by Adline Mango, RN  Outcome: Progressing Towards Goal  05/22/2021 0013 by Adline Mango, RN  Outcome: Progressing Towards Goal  Goal: *Smoking cessation discussed,if applicable(Stroke Metric)  05/22/2021  0018 by Adline Mango, RN  Outcome: Progressing Towards Goal  05/22/2021 0013 by Adline Mango, RN  Outcome: Progressing Towards  Goal  Goal: *Depression screening completed(Stroke Metric)  05/22/2021 0018 by Adline Mango, RN  Outcome: Progressing Towards Goal  05/22/2021 0013 by Adline Mango, RN  Outcome: Progressing Towards Goal     Problem: TIA/CVA Stroke: 0-24 hours  Goal: Off Pathway (Use only if patient is Off Pathway)  05/22/2021 0018 by Adline Mango, RN  Outcome: Progressing Towards Goal  05/22/2021 0013 by Adline Mango, RN  Outcome: Progressing Towards Goal  Goal: Activity/Safety  05/22/2021 0018 by Adline Mango, RN  Outcome: Progressing Towards Goal  05/22/2021 0013 by Adline Mango, RN  Outcome: Progressing Towards Goal  Goal: Consults, if ordered  05/22/2021 0018 by Adline Mango, RN  Outcome: Progressing Towards Goal  05/22/2021 0013 by Adline Mango, RN  Outcome: Progressing Towards Goal  Goal: Diagnostic Test/Procedures  05/22/2021 0018 by Adline Mango, RN  Outcome: Progressing Towards Goal  05/22/2021 0013 by Adline Mango, RN  Outcome: Progressing Towards Goal  Goal: Nutrition/Diet  05/22/2021 0018 by Adline Mango, RN  Outcome: Progressing Towards Goal  05/22/2021 0013 by Adline Mango, RN  Outcome: Progressing Towards Goal  Goal: Discharge Planning  05/22/2021 0018 by Adline Mango, RN  Outcome: Progressing Towards Goal  05/22/2021 0013 by Adline Mango, RN  Outcome: Progressing Towards Goal  Goal: Medications  05/22/2021 0018 by Adline Mango, RN  Outcome: Progressing Towards Goal  05/22/2021 0013 by Adline Mango, RN  Outcome: Progressing Towards Goal  Goal: Respiratory  05/22/2021 0018 by Adline Mango, RN  Outcome: Progressing Towards Goal  05/22/2021 0013 by Adline Mango, RN  Outcome: Progressing Towards Goal  Goal: Treatments/Interventions/Procedures  05/22/2021 0018 by Adline Mango, RN  Outcome: Progressing Towards Goal  05/22/2021 0013 by Adline Mango, RN  Outcome: Progressing Towards Goal  Goal: Minimize risk of bleeding post-thrombolytic infusion  05/22/2021 0018 by Adline Mango, RN  Outcome: Progressing Towards Goal  05/22/2021 0013 by Adline Mango, RN  Outcome:  Progressing Towards Goal  Goal: Monitor for complications post-thrombolytic infusion  05/22/2021 0018 by Adline Mango, RN  Outcome: Progressing Towards Goal  05/22/2021 0013 by Adline Mango, RN  Outcome: Progressing Towards Goal  Goal: Psychosocial  05/22/2021 0018 by Adline Mango, RN  Outcome: Progressing Towards Goal  05/22/2021 0013 by Adline Mango, RN  Outcome: Progressing Towards Goal  Goal: *Hemodynamically stable  05/22/2021 0018 by Adline Mango, RN  Outcome: Progressing Towards Goal  05/22/2021 0013 by Adline Mango, RN  Outcome: Progressing Towards Goal  Goal: *Neurologically stable  Description: Absence of additional neurological deficits    05/22/2021 0018 by Adline Mango, RN  Outcome: Progressing Towards Goal  05/22/2021 0013 by Adline Mango, RN  Outcome: Progressing Towards Goal  Goal: *Verbalizes anxiety and depression are reduced or absent  05/22/2021 0018 by Adline Mango, RN  Outcome: Progressing Towards Goal  05/22/2021 0013 by Adline Mango, RN  Outcome: Progressing Towards Goal  Goal: *Absence of Signs of Aspiration on Current Diet  05/22/2021 0018 by Adline Mango, RN  Outcome: Progressing Towards Goal  05/22/2021 0013 by Adline Mango, RN  Outcome: Progressing Towards Goal  Goal: *Absence of deep venous thrombosis signs and symptoms(Stroke Metric)  05/22/2021 0018 by Adline Mango, RN  Outcome: Progressing Towards Goal  05/22/2021 0013 by Adline Mango, RN  Outcome: Progressing Towards Goal  Goal: *Ability to perform ADLs and demonstrates progressive mobility and function  05/22/2021 0018 by Adline Mango, RN  Outcome: Progressing Towards Goal  05/22/2021 0013 by Adline MangoBoone, Erin, RN  Outcome: Progressing Towards Goal  Goal: *Stroke education started(Stroke Metric)  05/22/2021 0018 by Adline MangoBoone, Erin, RN  Outcome: Progressing Towards Goal  05/22/2021 0013 by Adline MangoBoone, Erin, RN  Outcome: Progressing Towards Goal  Goal: *Dysphagia screen performed(Stroke Metric)  05/22/2021 0018 by Adline MangoBoone, Erin, RN  Outcome: Progressing Towards Goal  05/22/2021 0013  by Adline MangoBoone, Erin, RN  Outcome: Progressing Towards Goal  Goal: *Rehab consulted(Stroke Metric)  05/22/2021 0018 by Adline MangoBoone, Erin, RN  Outcome: Progressing Towards Goal  05/22/2021 0013 by Adline MangoBoone, Erin, RN  Outcome: Progressing Towards Goal     Problem: TIA/CVA Stroke: Day 2 Until Discharge  Goal: Off Pathway (Use only if patient is Off Pathway)  05/22/2021 0018 by Adline MangoBoone, Erin, RN  Outcome: Progressing Towards Goal  05/22/2021 0013 by Adline MangoBoone, Erin, RN  Outcome: Progressing Towards Goal  Goal: Activity/Safety  05/22/2021 0018 by Adline MangoBoone, Erin, RN  Outcome: Progressing Towards Goal  05/22/2021 0013 by Adline MangoBoone, Erin, RN  Outcome: Progressing Towards Goal  Goal: Diagnostic Test/Procedures  05/22/2021 0018 by Adline MangoBoone, Erin, RN  Outcome: Progressing Towards Goal  05/22/2021 0013 by Adline MangoBoone, Erin, RN  Outcome: Progressing Towards Goal  Goal: Nutrition/Diet  05/22/2021 0018 by Adline MangoBoone, Erin, RN  Outcome: Progressing Towards Goal  05/22/2021 0013 by Adline MangoBoone, Erin, RN  Outcome: Progressing Towards Goal  Goal: Discharge Planning  05/22/2021 0018 by Adline MangoBoone, Erin, RN  Outcome: Progressing Towards Goal  05/22/2021 0013 by Adline MangoBoone, Erin, RN  Outcome: Progressing Towards Goal  Goal: Medications  05/22/2021 0018 by Adline MangoBoone, Erin, RN  Outcome: Progressing Towards Goal  05/22/2021 0013 by Adline MangoBoone, Erin, RN  Outcome: Progressing Towards Goal  Goal: Respiratory  05/22/2021 0018 by Adline MangoBoone, Erin, RN  Outcome: Progressing Towards Goal  05/22/2021 0013 by Adline MangoBoone, Erin, RN  Outcome: Progressing Towards Goal  Goal: Treatments/Interventions/Procedures  05/22/2021 0018 by Adline MangoBoone, Erin, RN  Outcome: Progressing Towards Goal  05/22/2021 0013 by Adline MangoBoone, Erin, RN  Outcome: Progressing Towards Goal  Goal: Psychosocial  05/22/2021 0018 by Adline MangoBoone, Erin, RN  Outcome: Progressing Towards Goal  05/22/2021 0013 by Adline MangoBoone, Erin, RN  Outcome: Progressing Towards Goal  Goal: *Verbalizes anxiety and depression are reduced or absent  05/22/2021 0018 by Adline MangoBoone, Erin, RN  Outcome: Progressing Towards  Goal  05/22/2021 0013 by Adline MangoBoone, Erin, RN  Outcome: Progressing Towards Goal  Goal: *Absence of aspiration  05/22/2021 0018 by Adline MangoBoone, Erin, RN  Outcome: Progressing Towards Goal  05/22/2021 0013 by Adline MangoBoone, Erin, RN  Outcome: Progressing Towards Goal  Goal: *Absence of deep venous thrombosis signs and symptoms(Stroke Metric)  05/22/2021 0018 by Adline MangoBoone, Erin, RN  Outcome: Progressing Towards Goal  05/22/2021 0013 by Adline MangoBoone, Erin, RN  Outcome: Progressing Towards Goal  Goal: *Optimal pain control at patient's stated goal  05/22/2021 0018 by Adline MangoBoone, Erin, RN  Outcome: Progressing Towards Goal  05/22/2021 0013 by Adline MangoBoone, Erin, RN  Outcome: Progressing Towards Goal  Goal: *Tolerating diet  05/22/2021 0018 by Adline MangoBoone, Erin, RN  Outcome: Progressing Towards Goal  05/22/2021 0013 by Adline MangoBoone, Erin, RN  Outcome: Progressing Towards Goal  Goal: *Ability to perform ADLs and demonstrates progressive mobility and function  05/22/2021 0018 by Adline MangoBoone, Erin, RN  Outcome: Progressing Towards Goal  05/22/2021 0013 by Adline MangoBoone, Erin, RN  Outcome: Progressing Towards Goal  Goal: *Stroke education continued(Stroke Metric)  05/22/2021 0018 by Adline MangoBoone, Erin, RN  Outcome: Progressing Towards Goal  05/22/2021 0013 by Adline MangoBoone, Erin, RN  Outcome: Progressing Towards Goal

## 2021-05-22 NOTE — Progress Notes (Signed)
Progress  Notes by Laddie Aquas, MD at 05/22/21 0820                Author: Laddie Aquas, MD  Service: Cardiology  Author Type: Physician       Filed: 05/22/21 1449  Date of Service: 05/22/21 0820  Status: Addendum          Editor: Laddie Aquas, MD (Physician)          Related Notes: Original Note by Sharla Kidney, NP (Nurse Practitioner) filed at 05/22/21 [phone]                                                                                     [place] [facility]                     Cardiology Care Note       Initial Encounter      Follow-up        Patient Name: Deborah [name] - DOB:1955-05-24 - RUE:[id number]   Primary Cardiologist: [facility] Physicians: Thurston Pounds MD   Consulting Cardiologist: [facility] Physicians: Dr Welton Flakes         Reason for encounter: CHF       HPI:         Deborah [name] is a 66 y.o. female with PMH of HF p EF, a fib, possible atrial lead vegetation treated abx, repeat ECHO with no evidence of vegetation with COPD, A/C resp failure who was recently discharged from hospital on  04/24/2021 to [facility] for rehab.       She says that she had not been receiving her Bumex on a consistent basis and missed several days dosing.       She started having a progressively worsening SOB associated with lower extremity swelling. She says she was using oxygen but required supplementation in the ED. She denies any cough or fever. No chills, nausea or vomiting.      Subjective:        Deborah [name] reports breathing is ok.          Assessment and Plan          1 A/C HF p EF: seems as if her diuretics were not being administered at SNF. Cont po loops as  able, on diamox. Repeat TTE pending       2 Hx a fib s/p PPM (not MRI compatible)/ s/p AVR: Has had bigeminy and intermittent episodes of SVT with Pacer tracking at 150 bpm. BB  held due to hypotension . Not previously anticoagulated (thrombocytopenia).        3 Bacteremia due to group B Streptococcus: during recent  admission, she had GBS bacteremia complicated by pacemaker line vegetation. Continue IV Ceftriaxone scheduled to run through  05/23/21.       4 Acute on chronic respiratory failure with hypoxia and hypercapnia/COPD: On NC        5 Hypotension: increase midodrine, remains on neo 50 mcg       6 T 2 DM       7 CKD : stable       8 Obesity: Body  mass index is 53.09 kg/m.       9 Thrombocytopenia           CARDIOLOGY ATTENDING   Patient personally seen and examined. All the elements of history and examination were personally performed. Assessment and plan was discussed and agree.         + DOE No CP   Obese, NAD, Hrt RRR, 3/6 systolic murmur RSB, + obese, + LE edema    Tele - atrial paced with lots of PVC           1) Acute on chronic HFpEF   - p/w SOB, volume overload 04/08/21   - Echo March 2023 - LVEF 50-55%   - cont oral diuretics - Bumex and diamox - as long as BP allows    - Of note she has had problems hypotension and has been on phenylephrine   ---> given hypotension and CHF and possible aortic valve stenosis --> recheck a complete echo        2) s/p AVR  - Echo 04/17/21 -  Aortic Valve: Not well visualized. Edwards bovine bioprosthetic valve with a size of 25 mm. Mild regurgitation. Stenosis of the aortic valve. AV mean gradient is 38 mmHg.  AV area by continuity VTI is 1.0 cm2.   - recheck echo as above        3) History of Afib s/p PPM    - BB held due to hypotension .    - Not previously anticoagulated (thrombocytopenia).         4) Bacteremia due to group B Streptococcus: during recent admission, she had GBS bacteremia complicated by pacemaker line vegetation. Continue IV Ceftriaxone scheduled to run through 05/23/21.       5) Acute on chronic respiratory failure with hypoxia and hypercapnia/COPD:    - On NC        6) Hypotension: Will try midodrine to decrease use of pressors       7) Frequent PVC's    - has had this for a while    - Holter 08/2020 > 30 thousands PVC, 10 beat NSVT, brief PAT   - cardiolite OK  1/23    ---> start amiodarone PO load (unable to take BB due to hypotension)        Tharon Aquas, MD, [facility]            [x]      High complexity decision making was performed   [x]      Patient is at high-risk of decompensation with multiple organ involvement                 ____________________________________________________________      Cardiac testing   04/08/21      ECHO ADULT FOLLOW-UP OR LIMITED 04/17/2021 04/17/2021      Interpretation Summary   ?  Aortic Valve: Not well visualized. Edwards bovine bioprosthetic valve with a size of 25 mm. Mild regurgitation.  Stenosis of the aortic valve. AV mean gradient is 38 mmHg. AV area by continuity VTI is 1.0 cm2.   ?  Technical qualifiers: Echo study was technically difficult due to patient's body habitus.      Signed by: Candyce Churn, DO on 04/17/2021  5:00 PM         02/16/21      NUCLEAR CARDIAC STRESS TEST 02/20/2021 02/26/2021      Interpretation Summary   ?  Nuclear Findings: LV perfusion is normal. There is no evidence  of inducible ischemia.   ?  Nuclear Findings: The defect appears to be caused by breast attenuation.   ?  ECG: Resting ECG demonstrates normal sinus rhythm.   ?  ECG: Stress ECG was negative for ischemia.   ?  Stress Test: A pharmacological stress test was performed using lexiscan. Hemodynamics are adequate for diagnosis. Blood pressure demonstrated  a normal response and heart rate demonstrated a normal response to stress. The patient's heart rate recovery was normal. The patient reported dyspnea and no chest pain during the stress test.      Ventricular bigeminy   Unifocal PVC, RBBB and superior axis PVCs      Signed by: Thurston PoundsMatthew Ngo, MD on 02/20/2021  5:00 PM      Most recent HS troponins:   No results for input(s): TROPHS in the last 72 hours.      No lab exists for component:  CKMB            ECG:      EKG Results                  Procedure  720  Value  Units  Date/Time           EKG, 12 LEAD, INITIAL [[id number]][[id number]]  Collected: 05/18/21 1916        Order Status: Completed  Updated: 05/19/21 1310                Ventricular Rate  123  BPM           Atrial Rate  57  BPM           QRS Duration  166  ms           Q-T Interval  392  ms           QTC Calculation (Bezet)  561  ms           Calculated R Axis  -73  degrees           Calculated T Axis  108  degrees                Diagnosis  --             Ventricular-paced rhythm   Abnormal ECG   When compared with ECG of 18-May-2021 01:25,   Vent. rate has increased BY   5 BPM   Confirmed by Barry Dieneswens, M.D., John ([id number]) on 05/19/2021 1:10:36 PM              EKG, 12 LEAD, INITIAL [[id number]][[id number]]  Collected: 05/18/21 0125       Order Status: Completed  Updated: 05/18/21 1556                Ventricular Rate  118  BPM           Atrial Rate  37  BPM           QRS Duration  172  ms           Q-T Interval  406  ms           QTC Calculation (Bezet)  569  ms           Calculated R Axis  -70  degrees           Calculated T Axis  104  degrees                Diagnosis  --  Ventricular-paced rhythm   Abnormal ECG   Confirmed by Barry Dienes, M.D., Jonny Ruiz ([id number]) on 05/18/2021 3:56:31 PM              EKG, 12 LEAD, INITIAL [[id number]]  Collected: 05/17/21 2134       Order Status: Completed  Updated: 05/18/21 1553                Ventricular Rate  107  BPM           Atrial Rate  107  BPM           P-R Interval  232  ms           QRS Duration  170  ms           Q-T Interval  362  ms           QTC Calculation (Bezet)  483  ms           Calculated R Axis  -71  degrees           Calculated T Axis  110  degrees                Diagnosis  --             Atrial-sensed ventricular-paced rhythm with prolonged AV conduction   Abnormal ECG   When compared with ECG of 11-May-2021 20:56,   Electronic ventricular pacemaker has replaced Electronic atrial pacemaker   Confirmed by Barry Dienes, M.D., John ([id number]) on 05/18/2021 3:53:20 PM              EKG, 12 LEAD, INITIAL [[id number]]  Collected: 05/11/21 2056       Order Status: Completed  Updated: 05/14/21 1706                 Ventricular Rate  84  BPM           Atrial Rate  84  BPM           P-R Interval  168  ms           QRS Duration  162  ms           Q-T Interval  388  ms           QTC Calculation (Bezet)  458  ms           Calculated P Axis  68  degrees           Calculated R Axis  -102  degrees           Calculated T Axis  11  degrees                Diagnosis  --             Sinus rhythm with frequent atrial-paced complexes and premature    supraventricular complexes in a pattern of bigeminy   Right bundle branch block   Septal infarct , age undetermined   T wave abnormality, consider lateral ischemia   Abnormal ECG      Confirmed by Barry Dienes, M.D., John [id number]) on 05/14/2021 5:06:24 PM              EKG, 12 LEAD, INITIAL [[id number]]  Collected: 05/11/21 2005       Order Status: Completed  Updated: 05/14/21 1706                Ventricular Rate  116  BPM  Atrial Rate  116  BPM           P-R Interval  200  ms           QRS Duration  190  ms           Q-T Interval  368  ms           QTC Calculation (Bezet)  511  ms           Calculated R Axis  -72  degrees           Calculated T Axis  105  degrees                Diagnosis  --             Atrial-sensed ventricular-paced rhythm   Abnormal ECG   Confirmed by Barry Dienes, M.D., John ([id number]) on 05/14/2021 5:06:15 PM              EKG, 12 LEAD, INITIAL [[id number]]  Collected: 05/11/21 1531       Order Status: Completed  Updated: 05/14/21 1706                Ventricular Rate  83  BPM           Atrial Rate  83  BPM           P-R Interval  160  ms           QRS Duration  160  ms           Q-T Interval  368  ms           QTC Calculation (Bezet)  432  ms           Calculated P Axis  64  degrees           Calculated R Axis  -100  degrees           Calculated T Axis  16  degrees                Diagnosis  --             Sinus rhythm with frequent atrial-paced complexes and premature    supraventricular complexes in a pattern of bigeminy   Right bundle branch block   Possible Lateral infarct ,  age undetermined   Abnormal ECG   Confirmed by Barry Dienes, M.D., John ([id number]) on 05/14/2021 5:06:02 PM              EKG 12 LEAD INITIAL [[id number]]  Collected: 05/10/21 0635       Order Status: Completed  Updated: 05/12/21 1304                Ventricular Rate  80  BPM           Atrial Rate  60  BPM           P-R Interval  212  ms           QRS Duration  112  ms           Q-T Interval  526  ms           QTC Calculation (Bezet)  606  ms           Calculated P Axis  61  degrees           Calculated R Axis  -55  degrees  Calculated T Axis  -158  degrees                Diagnosis  --             Atrial-paced rhythm with prolonged AV conduction with frequent premature    ventricular complexes   Incomplete right bundle branch block   Left anterior fascicular block   Septal infarct , age undetermined   T wave abnormality, consider lateral ischemia   Prolonged QT   Abnormal ECG   When compared with ECG of 09-May-2021 20:16,   No significant change      Confirmed by Rathi MD, Vikas [phone]) on 05/12/2021 1:04:16 PM              EKG, 12 LEAD, INITIAL [[id number]]         Order Status: Sent         EKG 12 LEAD INITIAL [[id number]]  Collected: 05/09/21 2016       Order Status: Completed  Updated: 05/11/21 1349                Ventricular Rate  97  BPM           Atrial Rate  49  BPM           P-R Interval  160  ms           QRS Duration  160  ms           Q-T Interval  350  ms           QTC Calculation (Bezet)  444  ms                  Calculated P Axis  67  degrees                  Calculated R Axis  -117  degrees           Calculated T Axis  6  degrees                Diagnosis  --             Sinus bradycardia with marked sinus arrhythmia with frequent atrial-paced    complexes and with frequent and consecutive premature ventricular complexes   Right bundle branch block   Possible Lateral infarct , age undetermined   Abnormal ECG   When compared with ECG of 13-Apr-2021 22:14,   premature ventricular complexes are now present    premature atrial complexes are no longer present   Borderline criteria for Lateral infarct are now present   Confirmed by Rathi MD, Vikas ([id number]) on 05/11/2021 1:49:45 PM                           Review of Systems:      All other systems reviewed and all negative except as written in HPI       Patient unable to provide secondary  to condition        Past Medical History:        Diagnosis  Date         ?  (HFpEF) heart failure with preserved ejection fraction (HCC)       ?  Anxiety and depression       ?  Aortic valve replaced            S/p bovine aortic valve replacement.         ?  Asthma       ?  Chronic narcotic use       ?  Chronic obstructive pulmonary disease (HCC)       ?  Chronic pain       ?  CKD (chronic kidney disease), stage III (HCC)            Baseline creatinine is 1.3-1.4 with GFR in the 40s.         ?  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  GERD (gastroesophageal reflux disease)       ?  History of vascular access device  04/13/2021          4 FR Single PICC for LTABX: R cephalic vessell length 48 CM Max P leave @ 1 CM out; Arm circumferenc 40 CM         ?  Hyperlipidemia       ?  Hypothyroidism       ?  Morbid obesity (HCC)       ?  Neuropathy       ?  Obstructive sleep apnea           ?  Rhinitis            Past Surgical History:         Procedure  Laterality  Date          ?  COLONOSCOPY  N/A  12/01/2020          COLONOSCOPY performed by Ian Malkin, MD at [facility] ENDOSCOPY          ?  HX AORTIC VALVE REPLACEMENT              Bovine bioprosthetic          ?  HX PACEMAKER            Social Hx:  reports that she quit smoking about 11 years ago. Her smoking use included cigarettes. She has a 40.00 pack-year smoking history. She has never used smokeless tobacco. She reports that she does not currently  use alcohol. She reports that she does not use drugs.   Family Hx: family history includes Hypertension in her father and mother.     Allergies        Allergen  Reactions         ?   Nitroglycerin  Unknown (comments)             hypotension         ?  Aloe Vera  Rash     ?  Hydrochlorothiazide  Other (comments)             Reports 'kidneys dry up"          ?  Tetanus And Diphther. Tox (Pf)  Swelling             Swelling of arm and it turns black              OBJECTIVE:     Wt Readings from Last 3 Encounters:        05/22/21  153.8 kg (339 lb)     04/24/21  (!) 160.8 kg (354 lb 8 oz)        03/09/21  149.7 kg (330 lb)           Intake/Output Summary (Last 24 hours) at 05/22/2021 0820   Last data filed at 05/22/2021 4401     Gross  per 24 hour        Intake  826.66 ml        Output  2950 ml        Net  -2123.34 ml              Physical Exam:      Vitals:      Vitals:             05/22/21 0717  05/22/21 0727  05/22/21 0728  05/22/21 0751           BP:  102/69      113/67     Pulse:  86    86  87     Resp:  19    16       Temp:  97.4 F (36.3 C)    98 F (36.7 C)       SpO2:  99%  99%  93%  97%     Weight:                   Height:                Telemetry: SR, paced w/ episodes of tachy        Gen: Morbidly obese, in no distress    Neck: Supple, No JVD, No Carotid Bruit   Resp: No accessory muscle use, diminished breath sounds, B/L Basal rales are present.    Card: Regular Rate,Rythm, Normal S1, S2, No murmurs, rubs or gallop.    Abd:   obese, Soft, non-tender, non-distended, BS+    MSK: No cyanosis   Skin: No rashes     Neuro: Moving all four extremities, follows commands appropriately   Psych: Fair insight, oriented to person, place, alert, Nml Affect   LE: 3+ edema      Data Review:       Radiology:    XR Results (most recent):   Results from Hospital Encounter encounter on 05/09/21      XR CHEST PORT      Narrative   EXAM:  XR CHEST PORT      INDICATION: Evaluate bilateral infiltrates      COMPARISON: 05/17/2021      TECHNIQUE: Upright portable chest AP view      FINDINGS: Median sternotomy changes and left subclavian pacemaker with cardiac   monitoring leads. Cardiomegaly. Pulmonary edema  unchanged      The lungs and pleural spaces are clear. The visualized bones and upper abdomen   are age-appropriate.      Impression   Pulmonary edema unchanged.           Recent Labs            05/22/21   0145  05/21/21   0333     NA  137  139     K  4.1  3.7     CL  101  99     CO2  36*  36*     BUN  35*  40*     CREA  1.23*  1.21*     GLU  130*  119*     PHOS  3.0  3.6         CA  8.9  9.0          Recent Labs            05/22/21   0145  05/21/21   0333     WBC  10.0  5.0     HGB  9.6*  8.6*     HCT  32.0*  29.1*         PLT  114*  72*        No results for input(s): PTP, INR, AP, INREXT, INREXT in the last 72 hours.      No lab exists for component: PTTP, GPT, SGOT      No results for input(s): CHOL, LDLC in the last 72 hours.      No lab exists for component: TGL, HDLC,  HBA1C         Current meds:      Current Facility-Administered Medications:    ?  iron sucrose (VENOFER) injection 200 mg, 200 mg, IntraVENous, ONCE, Do, Khoi B, MD   ?  midodrine (PROAMATINE) tablet 10 mg, 10 mg, Oral, TID WITH MEALS, Fisher, Georga Hacking, NP   ?  bumetanide (BUMEX) tablet 1 mg, 1 mg, Oral, DAILY, Laddie Aquas, MD   ?  meropenem (MERREM) 1 g in 0.9% sodium chloride (MBP/ADV) 50 mL MBP, 1 g, IntraVENous, Q8H, [name], MD, Last Rate: 16.7 mL/hr at 05/22/21 0313, 1 g at 05/22/21 0313   ?  L.acidophilus-paracasei-S.thermophil-bifidobacter (RISAQUAD) 8 billion cell capsule, 1 Capsule, Oral, DAILY, Do, Khoi B, MD, 1 Capsule at 05/21/21 0825   ?  PHENYLephrine (NEO-SYNEPHRINE) 50 mg in 0.9% sodium chloride 250 mL (Vial2Bag), 10-100 mcg/min, IntraVENous, TITRATE, Edwards, Sabrina C, NP, Last Rate: 15 mL/hr at 05/22/21 0753, 50 mcg/min at 05/22/21 0753   ?  albuterol-ipratropium (DUO-NEB) 2.5 MG-0.5 MG/3 ML, 3 mL, Nebulization, Q6H RT, Bammes, Nathan O, DO, 3 mL at 05/22/21 0726   ?  pantoprazole (PROTONIX) tablet 40 mg, 40 mg, Oral, ACB, Choudhary, Bhavana, MD, 40 mg at 05/22/21 0631   ?  heparin (porcine) injection 5,000 Units,  5,000 Units, SubCUTAneous, Q8H, Choudhary, Bhavana, MD, 5,000 Units at 05/22/21 0630   ?  [Held by provider] metoprolol succinate (TOPROL-XL) XL tablet 25 mg, 25 mg, Oral, DAILY, Fisher, Georga Hacking, NP, 25 mg at 05/16/21 0847   ?  acetaZOLAMIDE (DIAMOX) tablet 250 mg, 250 mg, Oral, BID, Melynda Keller, MD, 250 mg at 05/21/21 1713   ?  nystatin (MYCOSTATIN) 100,000 unit/gram cream, , Topical, BID, Marca Ancona C, NP, Given at 05/21/21 1738   ?  traZODone (DESYREL) tablet 100 mg, 100 mg, Oral, QHS, Horald Chestnut, DO, 100 mg at 05/21/21 2107   ?  oxyCODONE IR (ROXICODONE) tablet 5 mg, 5 mg, Oral, Q6H PRN, Horald Chestnut, DO, 5 mg at 05/21/21 2238   ?  albuterol-ipratropium (DUO-NEB) 2.5 MG-0.5 MG/3 ML, 3 mL, Nebulization, Q4H PRN, Horald Chestnut, DO, 3 mL at 05/15/21 2003   ?  insulin lispro (HUMALOG) injection, , SubCUTAneous, AC&HS, Horald Chestnut, DO, 3 Units at 05/21/21 1644   ?  glucose chewable tablet 16 g, 4 Tablet, Oral, PRN, Horald Chestnut, DO   ?  glucagon (GLUCAGEN) injection 1 mg, 1 mg, IntraMUSCular, PRN, Jamil, Arna Medici, DO   ?  dextrose 10% infusion 0-250 mL, 0-250 mL, IntraVENous, PRN, Horald Chestnut, DO   ?  [Held by provider] arformoteroL (BROVANA) neb solution 15 mcg, 15 mcg, Nebulization, BID RT, Jamil, Arna Medici, DO, 15 mcg at 05/17/21 0745   ?  budesonide (PULMICORT) 500 mcg/2 ml nebulizer suspension, 500 mcg, Nebulization, BID RT, Horald Chestnut, DO, 500 mcg at 05/22/21 0737   ?  sodium chloride (NS) flush 5-40 mL, 5-40 mL, IntraVENous, Q8H, Ricard Dillon  O, MD, 10 mL at 05/22/21 0630   ?  sodium chloride (NS) flush 5-40 mL, 5-40 mL, IntraVENous, PRN, Melynda Keller, MD   ?  acetaminophen (TYLENOL) tablet 650 mg, 650 mg, Oral, Q6H PRN, 650 mg at 05/20/21 2157 **OR** acetaminophen (TYLENOL) suppository 650 mg, 650 mg, Rectal, Q6H PRN, Melynda Keller, MD   ?  polyethylene glycol (MIRALAX) packet 17 g, 17 g, Oral, DAILY PRN, Melynda Keller, MD   ?  senna (SENOKOT) tablet 8.6 mg, 1 Tablet, Oral, DAILY PRN, Melynda Keller, MD, 8.6 mg at 05/19/21 1700   ?  promethazine (PHENERGAN) tablet 12.5 mg, 12.5 mg, Oral, Q6H PRN **OR** ondansetron (ZOFRAN) injection 4 mg, 4 mg, IntraVENous, Q6H PRN, Melynda Keller, MD, 4 mg at 05/17/21 0956   ?  aspirin chewable tablet 81 mg, 81 mg, Oral, DAILY, Melynda Keller, MD, 81 mg at 05/21/21 0825   ?  atorvastatin (LIPITOR) tablet 10 mg, 10 mg, Oral, DAILY, Melynda Keller, MD, 10 mg at 05/21/21 0827   ?  citalopram (CELEXA) tablet 40 mg, 40 mg, Oral, DAILY, Melynda Keller, MD, 40 mg at 05/21/21 0827   ?  [Held by provider] glipiZIDE (GLUCOTROL) tablet 5 mg, 5 mg, Oral, BID WITH MEALS, Melynda Keller, MD, 5 mg at 05/17/21 0805   ?  levothyroxine (SYNTHROID) tablet 150 mcg, 150 mcg, Oral, ACB, Melynda Keller, MD, 150 mcg at 05/22/21 0630   ?  montelukast (SINGULAIR) tablet 10 mg, 10 mg, Oral, QHS, Melynda Keller, MD, 10 mg at 05/21/21 2107   ?  sucralfate (CARAFATE) tablet 1 g, 1 g, Oral, TID, Melynda Keller, MD, 1 g at 05/21/21 2106      Sharla Kidney, NP      [facility] Cardiology   Call center: (P) [phone]  (F) [phone]         CC:Barrett, Lance Sell, NP

## 2021-05-22 NOTE — Progress Notes (Signed)
Progress  Notes by Dayna Barker, MD at 05/22/21 [phone]                Author: Dayna Barker, MD  Service: Internal Medicine  Author Type: Physician       Filed: 05/22/21 0949  Date of Service: 05/22/21 0946  Status: Signed          Editor: Dayna Barker, MD (Physician)               [facility]   [address] [place], [place] [address]   [phone]         Hospitalist Progress Note         NAME: Deborah [name]    DOB:  [birth year]   MRM:  [id number]      Date/Time of service: 05/22/2021  9:46 AM               Subjective:        Chief Complaint:  Patient was personally seen and examined by me during this time period.  Chart reviewed.  Remains on pressors.  No chest pain,  SOB               Objective:             Vitals:           Last 24hrs VS reviewed since prior progress note. Most recent are:      Visit Vitals      BP  (!) 95/47 (BP 1 Location: Left upper arm, BP Patient Position: Sitting)     Pulse  86     Temp  98 F (36.7 C)     Resp  22     Ht   (1.702 m)     Wt  153.8 kg (339 lb)     SpO2  93%        BMI  53.09 kg/m        SpO2 Readings from Last 6 Encounters:      05/22/21  93%      04/24/21  97%      03/09/21  96%      01/26/21  90%      12/03/20  96%      11/05/20  92%           O2 Flow Rate (L/min): 2 l/min        Intake/Output Summary (Last 24 hours) at 05/22/2021 0946   Last data filed at 05/22/2021 2841     Gross per 24 hour        Intake  826.66 ml        Output  2950 ml        Net  -2123.34 ml                 Exam:        Physical Exam:      Gen:  Well-developed, well-nourished, morbidly obese, in no acute distress   HEENT:  Pink conjunctivae, PERRL, hearing intact to voice, moist mucous membranes   Neck:  Supple, without masses, thyroid non-tender   Resp:  No accessory muscle use, clear breath sounds without wheezes rales or rhonchi   Card:  No murmurs, normal S1, S2 without thrills, 3+ edema   Abd:  Soft, non-tender, non-distended, normoactive bowel sounds are  present   Musc:  No cyanosis or clubbing   Skin:  No rashes  Neuro:  Cranial nerves 3-12 are grossly intact, generalized weakness, follows commands appropriately   Psych:  poor insight, oriented to person, place and time, alert      Medications Reviewed: (see below)      Lab Data Reviewed: (see below)      ______________________________________________________________________        Medications:          Current Facility-Administered Medications          Medication  Dose  Route  Frequency           ?  midodrine (PROAMATINE) tablet 10 mg   10 mg  Oral  TID WITH MEALS     ?  bumetanide (BUMEX) tablet 1 mg   1 mg  Oral  DAILY     ?  meropenem (MERREM) 1 g in 0.9% sodium chloride (MBP/ADV) 50 mL MBP   1 g  IntraVENous  Q8H     ?  L.acidophilus-paracasei-S.thermophil-bifidobacter (RISAQUAD) 8 billion cell capsule   1 Capsule  Oral  DAILY     ?  PHENYLephrine (NEO-SYNEPHRINE) 50 mg in 0.9% sodium chloride 250 mL (Vial2Bag)   10-100 mcg/min  IntraVENous  TITRATE     ?  albuterol-ipratropium (DUO-NEB) 2.5 MG-0.5 MG/3 ML   3 mL  Nebulization  Q6H RT     ?  pantoprazole (PROTONIX) tablet 40 mg   40 mg  Oral  ACB     ?  heparin (porcine) injection 5,000 Units   5,000 Units  SubCUTAneous  Q8H     ?  [Held by provider] metoprolol succinate (TOPROL-XL) XL tablet 25 mg   25 mg  Oral  DAILY     ?  acetaZOLAMIDE (DIAMOX) tablet 250 mg   250 mg  Oral  BID     ?  nystatin (MYCOSTATIN) 100,000 unit/gram cream     Topical  BID     ?  traZODone (DESYREL) tablet 100 mg   100 mg  Oral  QHS     ?  oxyCODONE IR (ROXICODONE) tablet 5 mg   5 mg  Oral  Q6H PRN     ?  albuterol-ipratropium (DUO-NEB) 2.5 MG-0.5 MG/3 ML   3 mL  Nebulization  Q4H PRN     ?  insulin lispro (HUMALOG) injection     SubCUTAneous  AC&HS     ?  glucose chewable tablet 16 g   4 Tablet  Oral  PRN     ?  glucagon (GLUCAGEN) injection 1 mg   1 mg  IntraMUSCular  PRN     ?  dextrose 10% infusion 0-250 mL   0-250 mL  IntraVENous  PRN     ?  [Held by provider] arformoteroL  (BROVANA) neb solution 15 mcg   15 mcg  Nebulization  BID RT     ?  budesonide (PULMICORT) 500 mcg/2 ml nebulizer suspension   500 mcg  Nebulization  BID RT     ?  sodium chloride (NS) flush 5-40 mL   5-40 mL  IntraVENous  Q8H     ?  sodium chloride (NS) flush 5-40 mL   5-40 mL  IntraVENous  PRN     ?  acetaminophen (TYLENOL) tablet 650 mg   650 mg  Oral  Q6H PRN          Or           ?  acetaminophen (TYLENOL) suppository 650 mg   650 mg  Rectal  Q6H PRN     ?  polyethylene glycol (MIRALAX) packet 17 g   17 g  Oral  DAILY PRN           ?  senna (SENOKOT) tablet 8.6 mg   1 Tablet  Oral  DAILY PRN           ?  promethazine (PHENERGAN) tablet 12.5 mg   12.5 mg  Oral  Q6H PRN          Or           ?  ondansetron (ZOFRAN) injection 4 mg   4 mg  IntraVENous  Q6H PRN     ?  aspirin chewable tablet 81 mg   81 mg  Oral  DAILY     ?  atorvastatin (LIPITOR) tablet 10 mg   10 mg  Oral  DAILY     ?  citalopram (CELEXA) tablet 40 mg   40 mg  Oral  DAILY     ?  [Held by provider] glipiZIDE (GLUCOTROL) tablet 5 mg   5 mg  Oral  BID WITH MEALS     ?  levothyroxine (SYNTHROID) tablet 150 mcg   150 mcg  Oral  ACB     ?  montelukast (SINGULAIR) tablet 10 mg   10 mg  Oral  QHS           ?  sucralfate (CARAFATE) tablet 1 g   1 g  Oral  TID                 Lab Review:          Recent Labs             05/22/21   0145  05/21/21   0333  05/20/21   0251     WBC  10.0  5.0  5.9     HGB  9.6*  8.6*  8.0*     HCT  32.0*  29.1*  26.9*          PLT  114*  72*  85*             Recent Labs             05/22/21   0145  05/21/21   0333  05/20/21   0251     NA  137  139  136     K  4.1  3.7  3.6     CL  101  99  97     CO2  36*  36*  37*     GLU  130*  119*  154*     BUN  35*  40*  44*     CREA  1.23*  1.21*  1.34*     CA  8.9  9.0  8.9     MG  2.2  2.2  2.3          PHOS  3.0  3.6   --              Lab Results         Component  Value  Date/Time            Glucose (POC)  123 (H)  05/22/2021 07:15 AM       Glucose (POC)  145 (H)  05/21/2021 09:04 PM        Glucose (POC)  230 (H)  05/21/2021 03:52 PM  Glucose (POC)  169 (H)  05/21/2021 10:58 AM            Glucose (POC)  121 (H)  05/21/2021 07:50 AM                    Assessment / Plan:        66 yo hx of HTN, DM, dCHF EF 50-55%, PVCs s/p pacer, bioprosthetic AVR, recent group B strep bacteremia, presented w/ hypoxia, dCHF, hypotension, ESBL UTI      1) Acute resp failure/hypoxia/hypercapnea: due to CHF, OSA, obesity hypoventilation.  Will cont O2, diamox prn, bipap.  Pulm following      2) Shock: POA, still present.  Likely due to sepsis.  No evidence of adrenal insuff or cardiogenic shock.  Had recent group B strep bacteremia, now with ESBL UTI.  Will cont pressors gtt, wean as tolerated.  Cont midodrine      3) ESBL E. Coli UTI/recent group B strep bacteremia: was receiving IV CTX at home (till 04/26).  Now on IV meropenem.  Will defer to ID regarding length of abx      4) Acute on chronic dCHF: EF 50-55%.  Still with significant edema.  Cont bumex, diamox.  Cards following      5) PVCs/bioprosthetic AVR: s/p pacer.  Cont ASA, statin      6) Anemia/thrombocytopenia: likely due to sepsis.  Has some iron def.  Will give 1 dose of IV iron today.  Monitor CBC.  Consult Hematology if worse      7) DM type 2: A1C 5.7%.  Cont SSI      8) Weakness: cont PT/OT.  Will need SNF      **Prior records, notes, labs, radiology, and medications reviewed in [facility]**      Total time spent with patient care: 35 Minutes (critical care due to pressors) **I personally saw and examined the patient during this time  period**                   Care Plan discussed with: Patient, nursing       Discussed:  Care Plan      Prophylaxis:  Hep SQ      Disposition:  SNF/LTC             ___________________________________________________      Attending Physician: Dayna Barker, MD

## 2021-05-22 NOTE — Progress Notes (Signed)
1900  Bedside and Verbal shift change report given to Leotis Shames, RN (oncoming nurse) by Elmarie Shiley, RN (offgoing nurse). Report included the following information SBAR, Kardex, Intake/Output, MAR, Recent Results, and Cardiac Rhythm apaced/vpaced .      Central line Type: PICC single lumen  Central Line Insert Date: 04/13/21  Reason Central Line Placed: long term antibiotics  Central Line Dressing Date: 05/22/21  Biopatch in place? Yes No: yes  Tubing labeled and appropriate? Yes No: yes  Alcohol caps on all open ports? Yes No: yes  Last CHG bath (time&date): 05/22/21 @ 1013  Reviewed with provider and central line must stay in for the following reasons: antibiotics    This patient was assisted with Intentional Toileting every 2 hours during this shift as appropriate.  Documentation of ambulation and output reflected on Flowsheet as appropriate.  Purposeful hourly rounding was completed using AIDET and 5Ps.  Outcomes of PHR documented as they occurred. Bed alarm in use as appropriate.  Dual Suction and ambubag in place.     0700  Bedside and Verbal shift change report given to Tiffany, RN (oncoming nurse) by Leotis Shames, RN (offgoing nurse). Report included the following information SBAR, Kardex, Intake/Output, MAR, Recent Results, and Cardiac Rhythm apaced/vpaced .

## 2021-05-22 NOTE — Progress Notes (Signed)
Problem: Mobility Impaired (Adult and Pediatric)  Goal: *Acute Goals and Plan of Care (Insert Text)  Description: FUNCTIONAL STATUS PRIOR TO ADMISSION: Patient was modified independent using a single point cane for household and community amb. Recent hospital admission and d/c to SNF [facility] x last 2+ weeks then readmit    HOME SUPPORT PRIOR TO ADMISSION: The patient lived with son's family but did not require assist.    Physical Therapy Goals  Revised 05/19/2021  1.  Patient will move from supine to sit and sit to supine , scoot up and down, and roll side to side in bed with modified independence within 7 day(s).  2.  Patient will transfer from bed to chair and chair to bed with modified independence using the least restrictive device within 7 day(s).  3.  Patient will perform sit to stand with modified independence within 7 day(s).  4.  Patient will ambulate with modified independence for 50 feet with the least restrictive device within 7 day(s).   5.  Patient will ascend/descend 5 stairs with 1 handrail(s) with modified independence within 7 day(s).         Physical Therapy Goals  Initiated 05/12/2021; reviewed 4/21 and remain appropriate as below   1.  Patient will move from supine to sit and sit to supine , scoot up and down, and roll side to side in bed with modified independence within 7 day(s).  NOT MET  2.  Patient will transfer from bed to chair and chair to bed with modified independence using the least restrictive device within 7 day(s). NOT MET  3.  Patient will perform sit to stand with modified independence within 7 day(s). NOT MET  4.  Patient will ambulate with modified independence for 50 feet with the least restrictive device within 7 day(s). NOT MET  5.  Patient will ascend/descend 5 stairs with 1 handrail(s) and SPC with modified independence within 7 day(s). NOT MET  Outcome: Not Progressing Towards Goal   PHYSICAL THERAPY TREATMENT  Patient: Deborah [name] (66 y.o. female)  Date:  05/22/2021  Diagnosis: Acute on chronic heart failure with preserved ejection fraction (HFpEF) (HCC) [I50.33] Acute on chronic heart failure with preserved ejection fraction (HFpEF) ([place])      Precautions: Contact, Fall  Chart, physical therapy assessment, plan of care and goals were reviewed.    ASSESSMENT  Patient continues with skilled PT services and is not progressing towards goals. Pt with continued hypotension at rest, requiring increased pressor support earlier today and requiring further increase by at time of attempted PT treatment due to MAP in 50s. Reviewed seated LE exercises with good return demonstration as below.     Current Level of Function Impacting Discharge (mobility/balance): unable to tolerate mobility training due to hypotension    Other factors to consider for discharge: pt hopes for discharge to private residence rather that return to rehab at discharge.         PLAN :  Patient continues to benefit from skilled intervention to address the above impairments.  Continue treatment per established plan of care.  to address goals.    Recommendation for discharge: (in order for the patient to meet his/her long term goals)  TBD pending improved BP stability; prior treating therapists recommending likely HHPT    This discharge recommendation:  Has not yet been discussed the attending provider and/or case management    IF patient discharges home will need the following DME: to be determined (TBD)  SUBJECTIVE:   Patient stated "I haven't been home in over a month and a half."    Pt received seated in recliner, agreeable to PT and cleared by RN.    OBJECTIVE DATA SUMMARY:   Critical Behavior:  Neurologic State: Alert, Eyes open spontaneously  Orientation Level: Oriented X4  Cognition: Appropriate for age attention/concentration, Appropriate safety awareness  Safety/Judgement: Awareness of environment  Functional Mobility Training:      Deferred due to hypotension with MAP below goal; RN notified  and present to room to manage.                                           Therapeutic Exercises:   Alternating LAQ with ankle pumps x 3-6 during knee extension x 6 reps.  HR 114 and SpO2 96% using 2LNCO2 with this exercise.   Pain Rating:  4/10 (NPS) low back and B feet  Offered education, encouragement, distraction    Activity Tolerance:   Poor due to hypotension    After treatment patient left in no apparent distress:   Sitting in chair and Call bell within reach    COMMUNICATION/COLLABORATION:   The patient's plan of care was discussed with: Registered nurse.     Reviewed importance of AROM of LEs as tolerated, progressive (assisted) mobility as appropriate, and importance of out of bed positioning.    Jinny Blossom, PT, DPT   Time Calculation: 13 mins

## 2021-05-22 NOTE — Progress Notes (Signed)
Transition of Care Plan: RUR-30%, LOS 13 days  Medical management continues  PT/OT following-recommending hh--set up with [facility] on IV abx, ceftriaxone through 4/26; meropenem--ID consulted  Cardiology following  Dietitian following  Currently on 2L O2 nc  CM following for d/c needs  L.Olive Bass, RN

## 2021-05-22 NOTE — Progress Notes (Unsigned)
[facility]     Name: Deborah [name] MRN: [id number]   DOB: 1955/02/29 Hospital: [facility]   Date: 05/22/2021        Impression Plan   Acute respiratory failure  Hypoxia  Hypercapnea  Pulmonary infiltrates  ESBL UTI  OSA  Morbid obesity               Wean O2 to keep sats above 90%  Diuresis as BP allows. Pt currently still on Neo with borderline BP.   Continue midodrine  Duonebs  Continue meropenem for ESBL UTI  OOB into chair  PT/OT as BP allows  Needs outpatient sleep eval appt with CPAP interrogation            Radiology  (personally reviewed) CXR4/20/23: basilar infiltrates-overall improving       Subjective     Cc: shortness of breath    66 yo with PMHx diastolic CHF, recent strep bacteremia and OSA presenting with increasing SOB. Found to have increased leg swelling and increased infiltrates after missing several doses of diuretics at rehab center. Clinical scenario complicated further by ESBL UTI and septic shock. Pt has hx of mild asthma, denies COPD. Smoked 1 ppd for 25 years, quit over 10 years ago.     Interval history  Afebrile  BP soft/stable; on 15 on neo  Sats 97% on 2L  Slept with BiPAP overnight  Bicarb 36  Creat 1.23  proBNP 4152 - down from 5191  Blood cx no growth x 5 days - final  2425 ml UOP    4/25 CXR: Pulmonary edema unchanged      Review of Systems: Sleepy this morning.  Voices no specific complaints.      A comprehensive review of systems was negative except for that written in the HPI.    Past Medical History:   Diagnosis Date    (HFpEF) heart failure with preserved ejection fraction (HCC)     Anxiety and depression     Aortic valve replaced     S/p bovine aortic valve replacement.    Asthma     Chronic narcotic use     Chronic obstructive pulmonary disease (HCC)     Chronic pain     CKD (chronic kidney disease), stage III (HCC)     Baseline creatinine is 1.3-1.4 with GFR in the 40s.    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)     GERD (gastroesophageal  reflux disease)     History of vascular access device 04/13/2021    4 FR Single PICC for LTABX: R cephalic vessell length 48 CM Max P leave @ 1 CM out; Arm circumferenc 40 CM    Hyperlipidemia     Hypothyroidism     Morbid obesity (HCC)     Neuropathy     Obstructive sleep apnea     Rhinitis       Past Surgical History:   Procedure Laterality Date    COLONOSCOPY N/A 12/01/2020    COLONOSCOPY performed by Ian Malkin, MD at [facility] ENDOSCOPY    HX AORTIC VALVE REPLACEMENT      Bovine bioprosthetic    HX PACEMAKER        Prior to Admission medications    Medication Sig Start Date End Date Taking? Authorizing Provider   bumetanide (BUMEX) 1 mg tablet Take 1 Tablet by mouth daily. 04/25/21  Yes Horald Chestnut, DO   lidocaine 4 % patch Apply to low back  Apply  patch to the affected area for 12 hours a day and remove for 12 hours a day. 04/24/21  Yes Horald Chestnut, DO   arformoteroL (BROVANA) 15 mcg/2 mL nebu neb solution 2 mL by Nebulization route two (2) times a day. 04/24/21  Yes Horald Chestnut, DO   miconazole (MICOTIN) 2 % topical powder Apply  to affected area two (2) times a day. APPLY TO bilateral breasts    Nursing, document site in comments 04/24/21  Yes Jamil, Arna Medici, DO   atorvastatin (LIPITOR) 10 mg tablet Take 1 Tablet by mouth daily.   Yes Provider, Historical   metoprolol succinate (TOPROL-XL) 25 mg XL tablet Take 1 Tablet by mouth daily. 02/21/21  Yes Thurston Pounds, MD   levothyroxine (SYNTHROID) 150 mcg tablet Take 1 Tablet by mouth Daily (before breakfast). 01/27/21  Yes Tefera, Mesfin, MD   aspirin 81 mg chewable tablet Take 1 Tablet by mouth daily. 12/03/20  Yes Carrolyn Meiers, MD   acetaminophen (TYLENOL) 500 mg tablet Take 2 Tablets by mouth every six (6) hours as needed for Pain.   Yes Provider, Historical   albuterol (PROVENTIL HFA, VENTOLIN HFA, PROAIR HFA) 90 mcg/actuation inhaler Take 2 Puffs by inhalation every six (6) hours as needed.   Yes Provider, Historical   citalopram (CELEXA) 40 mg tablet Take 1 Tablet by  mouth daily. 02/22/20  Yes Provider, Historical   fluticasone propionate (FLONASE) 50 mcg/actuation nasal spray 2 Sprays by Nasal route daily as needed.   Yes Provider, Historical   sucralfate (CARAFATE) 1 gram tablet Take 1 Tablet by mouth three (3) times daily.   Yes Provider, Historical   traZODone (DESYREL) 100 mg tablet Take 1 Tablet by mouth nightly. 12/31/19  Yes Provider, Historical   glimepiride (AMARYL) 2 mg tablet Take 1 Tablet by mouth two (2) times a day.   Yes Provider, Historical   montelukast (SINGULAIR) 10 mg tablet Take 1 Tablet by mouth nightly.   Yes Provider, Historical   cefTRIAXone 2 gram 2 g IV syringe 2 g by IntraVENous route every twenty-four (24) hours for 29 days. 04/24/21 05/23/21  Horald Chestnut, DO   naloxone (Narcan) 4 mg/actuation nasal spray Use 1 spray intranasally, then discard. Repeat with new spray every 2 min as needed for opioid overdose symptoms, alternating nostrils. 04/24/21   Horald Chestnut, DO     Current Facility-Administered Medications   Medication Dose Route Frequency    iron sucrose (VENOFER) injection 200 mg  200 mg IntraVENous ONCE    midodrine (PROAMATINE) tablet 10 mg  10 mg Oral TID WITH MEALS    bumetanide (BUMEX) tablet 1 mg  1 mg Oral DAILY    meropenem (MERREM) 1 g in 0.9% sodium chloride (MBP/ADV) 50 mL MBP  1 g IntraVENous Q8H    L.acidophilus-paracasei-S.thermophil-bifidobacter (RISAQUAD) 8 billion cell capsule  1 Capsule Oral DAILY    PHENYLephrine (NEO-SYNEPHRINE) 50 mg in 0.9% sodium chloride 250 mL (Vial2Bag)  10-100 mcg/min IntraVENous TITRATE    albuterol-ipratropium (DUO-NEB) 2.5 MG-0.5 MG/3 ML  3 mL Nebulization Q6H RT    pantoprazole (PROTONIX) tablet 40 mg  40 mg Oral ACB    heparin (porcine) injection 5,000 Units  5,000 Units SubCUTAneous Q8H    [Held by provider] metoprolol succinate (TOPROL-XL) XL tablet 25 mg  25 mg Oral DAILY    acetaZOLAMIDE (DIAMOX) tablet 250 mg  250 mg Oral BID    nystatin (MYCOSTATIN) 100,000 unit/gram cream   Topical BID     traZODone (DESYREL) tablet 100 mg  100 mg Oral QHS    insulin lispro (HUMALOG) injection   SubCUTAneous AC&HS    [Held by provider] arformoteroL (BROVANA) neb solution 15 mcg  15 mcg Nebulization BID RT    budesonide (PULMICORT) 500 mcg/2 ml nebulizer suspension  500 mcg Nebulization BID RT    sodium chloride (NS) flush 5-40 mL  5-40 mL IntraVENous Q8H    aspirin chewable tablet 81 mg  81 mg Oral DAILY    atorvastatin (LIPITOR) tablet 10 mg  10 mg Oral DAILY    citalopram (CELEXA) tablet 40 mg  40 mg Oral DAILY    [Held by provider] glipiZIDE (GLUCOTROL) tablet 5 mg  5 mg Oral BID WITH MEALS    levothyroxine (SYNTHROID) tablet 150 mcg  150 mcg Oral ACB    montelukast (SINGULAIR) tablet 10 mg  10 mg Oral QHS    sucralfate (CARAFATE) tablet 1 g  1 g Oral TID     Allergies   Allergen Reactions    Nitroglycerin Unknown (comments)     hypotension    Aloe Vera Rash    Hydrochlorothiazide Other (comments)     Reports 'kidneys dry up"     Tetanus And Diphther. Tox (Pf) Swelling     Swelling of arm and it turns black      Social History     Tobacco Use    Smoking status: Former     Packs/day: 1.00     Years: 40.00     Pack years: 40.00     Types: Cigarettes     Quit date: 03/21/2010     Years since quitting: 11.1    Smokeless tobacco: Never   Substance Use Topics    Alcohol use: Not Currently      Family History   Problem Relation Age of Onset    Hypertension Mother     Hypertension Father           Laboratory: I have personally reviewed the flowsheet and labs.     Recent Labs     05/22/21  0145 05/21/21  0333 05/20/21  0251   WBC 10.0 5.0 5.9   HGB 9.6* 8.6* 8.0*   HCT 32.0* 29.1* 26.9*   PLT 114* 72* 85*       Recent Labs     05/22/21  0145 05/21/21  0333 05/20/21  0251   NA 137 139 136   K 4.1 3.7 3.6   CL 101 99 97   CO2 36* 36* 37*   GLU 130* 119* 154*   BUN 35* 40* 44*   CREA 1.23* 1.21* 1.34*   CA 8.9 9.0 8.9   MG 2.2 2.2 2.3   PHOS 3.0 3.6  --          Objective:   Visit Vitals  BP 113/67   Pulse 87   Temp 98 F (36.7  C)   Resp 16   Ht 5\' 7"  (1.702 m)   Wt 153.8 kg (339 lb)   SpO2 97%   BMI 53.09 kg/m       Intake/Output Summary (Last 24 hours) at 05/22/2021 0630  Last data filed at 05/22/2021 0802  Gross per 24 hour   Intake 826.66 ml   Output 2950 ml   Net -2123.34 ml       EXAM:   GENERAL: awake, alert, obese HEENT:  anicteric, EOMI, no alar flaring or epistaxis, oral mucosa moist without cyanosis, NECK:  no jugular vein distention, no retractions, no thyromegaly  or masses, LUNGS: distant, CTA, no w/r/rHEART:  Regular rate and rhythm with no MGR; +2 edema is present in LE, ABDOMEN:  soft with no tenderness,EXTREMITIES:  warm with no cyanosis, SKIN:  no jaundice or ecchymosis, and NEUROLOGIC:  alert and oriented, grossly non-focal    Amedeo Kinsman, PA  [facility]

## 2021-05-23 LAB — ECHO ADULT COMPLETE
AV Mean Gradient: 34 mmHg
AV Mean Velocity: 2.8 m/s
AV Peak Gradient: 56 mmHg
AV Peak Velocity: 3.7 m/s
AV VTI: 75.7 cm
AV Velocity Ratio: 0.27
E/E' Lateral: 20.38
E/E' Ratio (Averaged): 23.77
E/E' Septal: 27.17
Fractional Shortening 2D: 22 % (ref 28–44)
LA Volume 2C: 167 mL — AB (ref 22–52)
LA Volume 4C: 84 mL — AB (ref 22–52)
LA Volume A/L: 127 mL
LA Volume BP: 117 mL — AB (ref 22–52)
LA Volume Index 2C: 66 mL/m2 — AB (ref 16–34)
LA Volume Index 4C: 33 mL/m2 (ref 16–34)
LA Volume Index A/L: 50 mL/m2 (ref 16–34)
LA Volume Index BP: 46 ml/m2 — AB (ref 16–34)
LV E' Lateral Velocity: 8 cm/s
LV E' Septal Velocity: 6 cm/s
LV EDV A2C: 78 mL
LV EDV A4C: 108 mL
LV EDV BP: 93 mL (ref 56–104)
LV EDV Index A2C: 31 mL/m2
LV EDV Index A4C: 43 mL/m2
LV EDV Index BP: 37 mL/m2
LV ESV A2C: 12 mL
LV ESV A4C: 23 mL
LV ESV BP: 17 mL — AB (ref 19–49)
LV ESV Index A2C: 5 mL/m2
LV ESV Index A4C: 9 mL/m2
LV ESV Index BP: 7 mL/m2
LV Mass 2D Index: 84.3 g/m2 (ref 43–95)
LV Mass 2D: 213.3 g — AB (ref 67–162)
LV RWT Ratio: 0.49
LVIDd Index: 1.94 cm/m2
LVIDd: 4.9 cm (ref 3.9–5.3)
LVIDs Index: 1.5 cm/m2
LVIDs: 3.8 cm
LVOT Mean Gradient: 2 mmHg
LVOT Peak Gradient: 4 mmHg
LVOT Peak Velocity: 1 m/s
LVOT VTI: 29 cm
LVOT:AV VTI Index: 0.38
LVPWd: 1.2 cm — AB (ref 0.6–0.9)
MV A Velocity: 0.93 m/s
MV E Velocity: 1.63 m/s
MV E Wave Deceleration Time: 137.1 ms
MV E/A: 1.75
PV Max Velocity: 1 m/s
PV Peak Gradient: 4 mmHg
RV Free Wall Peak S': 8 cm/s
TR Max Velocity: 2.07 m/s
TR Peak Gradient: 18 mmHg

## 2021-05-23 LAB — GLUCOSE, POC
Glucose (POC): 317 mg/dL — ABNORMAL HIGH (ref 65–117)
Glucose (POC): 147 mg/dL — ABNORMAL HIGH (ref 65–117)
Glucose (POC): 220 mg/dL — ABNORMAL HIGH (ref 65–117)
Glucose (POC): 182 mg/dL — ABNORMAL HIGH (ref 65–117)

## 2021-05-23 LAB — METABOLIC PANEL, BASIC
Anion gap: 0 mmol/L — ABNORMAL LOW (ref 5–15)
BUN/Creatinine ratio: 24 — ABNORMAL HIGH (ref 12–20)
BUN: 26 MG/DL — ABNORMAL HIGH (ref 6–20)
CO2: 33 mmol/L — ABNORMAL HIGH (ref 21–32)
Calcium: 9 MG/DL (ref 8.5–10.1)
Chloride: 106 mmol/L (ref 97–108)
Creatinine: 1.08 MG/DL — ABNORMAL HIGH (ref 0.55–1.02)
Glucose: 122 mg/dL — ABNORMAL HIGH (ref 65–100)
Potassium: 3.8 mmol/L (ref 3.5–5.1)
Sodium: 139 mmol/L (ref 136–145)
eGFR: 57 mL/min/{1.73_m2} — ABNORMAL LOW (ref >60)

## 2021-05-23 LAB — CBC W/O DIFF
ABSOLUTE NRBC: 0 10*3/uL (ref 0.00–0.01)
HCT: 30.9 % — ABNORMAL LOW (ref 35.0–47.0)
HGB: 9 g/dL — ABNORMAL LOW (ref 11.5–16.0)
MCH: 27 PG (ref 26.0–34.0)
MCHC: 29.1 g/dL — ABNORMAL LOW (ref 30.0–36.5)
MCV: 92.8 FL (ref 80.0–99.0)
MPV: 11.7 FL (ref 8.9–12.9)
NRBC: 0 PER 100 WBC
PLATELET: 90 10*3/uL — ABNORMAL LOW (ref 150–400)
RBC: 3.33 M/uL — ABNORMAL LOW (ref 3.80–5.20)
RDW: 16.7 % — ABNORMAL HIGH (ref 11.5–14.5)
WBC: 6.9 10*3/uL (ref 3.6–11.0)

## 2021-05-23 LAB — MAGNESIUM
Magnesium: 2.2 mg/dL (ref 1.6–2.4)
Magnesium: 2.2 mg/dL (ref 1.6–2.4)

## 2021-05-23 LAB — PHOSPHORUS
Phosphorus: 3.3 MG/DL (ref 2.6–4.7)
Phosphorus: 3.3 MG/DL (ref 2.6–4.7)

## 2021-05-23 LAB — NT-PRO BNP: NT pro-BNP: 2978 PG/ML — ABNORMAL HIGH (ref <125)

## 2021-05-23 LAB — ECHO (TTE) COMPLETE (PRN CONTRAST/BUBBLE/STRAIN/3D)
AV Mean Gradient: 34 mmHg
AV Mean Velocity: 2.8 m/s
AV Peak Gradient: 56 mmHg
AV Peak Velocity: 3.7 m/s
AV VTI: 75.7 cm
AV Velocity Ratio: 0.27
E/E' Lateral: 20.38
E/E' Ratio (Averaged): 23.77
E/E' Septal: 27.17
Fractional Shortening 2D: 22 % (ref 28–44)
IVSd: 1.1 cm — AB (ref 0.6–0.9)
LA Volume A-L A4C: 167 mL — AB (ref 22–52)
LA Volume A-L A4C: 84 mL — AB (ref 22–52)
LA Volume A/L: 127 mL
LA Volume BP: 117 mL — AB (ref 22–52)
LA Volume Index A-L A2C: 66 mL/m2 — AB (ref 16–34)
LA Volume Index A-L A4C: 33 mL/m2 (ref 16–34)
LA Volume Index A/L: 50 mL/m2 (ref 16–34)
LA Volume Index BP: 46 ml/m2 — AB (ref 16–34)
LV E' Lateral Velocity: 8 cm/s
LV E' Septal Velocity: 6 cm/s
LV EDV A2C: 78 mL
LV EDV A4C: 108 mL
LV EDV BP: 93 mL (ref 56–104)
LV EDV Index A2C: 31 mL/m2
LV EDV Index A4C: 43 mL/m2
LV EDV Index BP: 37 mL/m2
LV ESV A2C: 12 mL
LV ESV A4C: 23 mL
LV ESV BP: 17 mL — AB (ref 19–49)
LV ESV Index A2C: 5 mL/m2
LV ESV Index A4C: 9 mL/m2
LV ESV Index BP: 7 mL/m2
LV Mass 2D Index: 84.3 g/m2 (ref 43–95)
LV Mass 2D: 213.3 g — AB (ref 67–162)
LV RWT Ratio: 0.49
LVIDd Index: 1.94 cm/m2
LVIDd: 4.9 cm (ref 3.9–5.3)
LVIDs Index: 1.5 cm/m2
LVIDs: 3.8 cm
LVOT Mean Gradient: 2 mmHg
LVOT Peak Gradient: 4 mmHg
LVOT Peak Velocity: 1 m/s
LVOT VTI: 29 cm
LVOT:AV VTI Index: 0.38
LVPWd: 1.2 cm — AB (ref 0.6–0.9)
MV A Velocity: 0.93 m/s
MV E Velocity: 1.63 m/s
MV E Wave Deceleration Time: 137.1 ms
MV E/A: 1.75
PV Max Velocity: 1 m/s
PV Peak Gradient: 4 mmHg
RV Free Wall Peak S': 8 cm/s
TR Max Velocity: 2.07 m/s
TR Peak Gradient: 18 mmHg

## 2021-05-23 LAB — CBC
Hematocrit: 30.9 % — ABNORMAL LOW (ref 35.0–47.0)
Hemoglobin: 9 g/dL — ABNORMAL LOW (ref 11.5–16.0)
MCH: 27 PG (ref 26.0–34.0)
MCHC: 29.1 g/dL — ABNORMAL LOW (ref 30.0–36.5)
MCV: 92.8 FL (ref 80.0–99.0)
MPV: 11.7 FL (ref 8.9–12.9)
NRBC Absolute: 0 10*3/uL (ref 0.00–0.01)
Nucleated RBCs: 0 PER 100 WBC
Platelets: 90 10*3/uL — ABNORMAL LOW (ref 150–400)
RBC: 3.33 M/uL — ABNORMAL LOW (ref 3.80–5.20)
RDW: 16.7 % — ABNORMAL HIGH (ref 11.5–14.5)
WBC: 6.9 10*3/uL (ref 3.6–11.0)

## 2021-05-23 LAB — POCT GLUCOSE
POC Glucose: 317 mg/dL — ABNORMAL HIGH (ref 65–117)
POC Glucose: 147 mg/dL — ABNORMAL HIGH (ref 65–117)
POC Glucose: 220 mg/dL — ABNORMAL HIGH (ref 65–117)
POC Glucose: 182 mg/dL — ABNORMAL HIGH (ref 65–117)

## 2021-05-23 LAB — BASIC METABOLIC PANEL
Anion Gap: 0 mmol/L — ABNORMAL LOW (ref 5–15)
BUN: 26 MG/DL — ABNORMAL HIGH (ref 6–20)
Bun/Cre Ratio: 24 — ABNORMAL HIGH (ref 12–20)
CO2: 33 mmol/L — ABNORMAL HIGH (ref 21–32)
Calcium: 9 MG/DL (ref 8.5–10.1)
Chloride: 106 mmol/L (ref 97–108)
Creatinine: 1.08 MG/DL — ABNORMAL HIGH (ref 0.55–1.02)
ESTIMATED GLOMERULAR FILTRATION RATE: 57 mL/min/{1.73_m2} — ABNORMAL LOW (ref >60)
Glucose: 122 mg/dL — ABNORMAL HIGH (ref 65–100)
Potassium: 3.8 mmol/L (ref 3.5–5.1)
Sodium: 139 mmol/L (ref 136–145)

## 2021-05-23 LAB — PROBNP, N-TERMINAL: BNP: 2978 PG/ML — ABNORMAL HIGH (ref <125)

## 2021-05-23 MED ORDER — SODIUM CHLORIDE 0.9 % INJECTION
0.25 mg | Freq: Once | INTRAMUSCULAR | Status: AC
Start: 2021-05-23 — End: 2021-05-23
  Administered 2021-05-23: 22:00:00 via INTRAVENOUS

## 2021-05-23 MED ORDER — VIAL2BAG ADAPTOR (20 MM)
10 mg/mL | Status: AC
Start: 2021-05-23 — End: 2021-05-28
  Administered 2021-05-23 – 2021-05-26 (×30): via INTRAVENOUS

## 2021-05-23 MED FILL — INSULIN LISPRO 100 UNIT/ML INJECTION: 100 unit/mL | SUBCUTANEOUS | Qty: 4

## 2021-05-23 MED FILL — CARAFATE 1 GRAM TABLET: 1 gram | ORAL | Qty: 1

## 2021-05-23 MED FILL — RISAQUAD 8 BILLION CELL CAPSULE: 8 billion cell | ORAL | Qty: 1

## 2021-05-23 MED FILL — PHENYLEPHRINE 10 MG/ML INJECTION: 10 mg/mL | INTRAMUSCULAR | Qty: 5

## 2021-05-23 MED FILL — MEROPENEM 1 GRAM IV SOLR: 1 gram | INTRAVENOUS | Qty: 1

## 2021-05-23 MED FILL — AMIODARONE 200 MG TAB: 200 mg | ORAL | Qty: 2

## 2021-05-23 MED FILL — CITALOPRAM 20 MG TAB: 20 mg | ORAL | Qty: 2

## 2021-05-23 MED FILL — ASPIRIN 81 MG CHEWABLE TAB: 81 mg | ORAL | Qty: 1

## 2021-05-23 MED FILL — OXYCODONE 5 MG TAB: 5 mg | ORAL | Qty: 1

## 2021-05-23 MED FILL — MIDODRINE 5 MG TAB: 5 mg | ORAL | Qty: 2

## 2021-05-23 MED FILL — IPRATROPIUM-ALBUTEROL 2.5 MG-0.5 MG/3 ML NEB SOLUTION: 2.5 mg-0.5 mg/3 ml | RESPIRATORY_TRACT | Qty: 3

## 2021-05-23 MED FILL — ACETAZOLAMIDE 250 MG TAB: 250 mg | ORAL | Qty: 1

## 2021-05-23 MED FILL — INSULIN LISPRO 100 UNIT/ML INJECTION: 100 unit/mL | SUBCUTANEOUS | Qty: 3

## 2021-05-23 MED FILL — TRAZODONE 50 MG TAB: 50 mg | ORAL | Qty: 2

## 2021-05-23 MED FILL — ACETAMINOPHEN 325 MG TABLET: 325 mg | ORAL | Qty: 2

## 2021-05-23 MED FILL — BUDESONIDE 0.5 MG/2 ML NEB SUSPENSION: 0.5 mg/2 mL | RESPIRATORY_TRACT | Qty: 1

## 2021-05-23 MED FILL — INSULIN LISPRO 100 UNIT/ML INJECTION: 100 unit/mL | SUBCUTANEOUS | Qty: 2

## 2021-05-23 MED FILL — LEVOTHYROXINE 150 MCG TAB: 150 mcg | ORAL | Qty: 1

## 2021-05-23 MED FILL — HEPARIN (PORCINE) 5,000 UNIT/ML IJ SOLN: 5000 unit/mL | INTRAMUSCULAR | Qty: 1

## 2021-05-23 MED FILL — ATORVASTATIN 10 MG TAB: 10 mg | ORAL | Qty: 1

## 2021-05-23 MED FILL — PANTOPRAZOLE 40 MG TAB, DELAYED RELEASE: 40 mg | ORAL | Qty: 1

## 2021-05-23 MED FILL — COSYNTROPIN 0.25 MG SOLUTION FOR INJECTION: 0.25 mg | INTRAMUSCULAR | Qty: 0.25

## 2021-05-23 MED FILL — MONTELUKAST 10 MG TAB: 10 mg | ORAL | Qty: 1

## 2021-05-23 MED FILL — BUMETANIDE 1 MG TAB: 1 mg | ORAL | Qty: 1

## 2021-05-23 NOTE — Progress Notes (Signed)
Problem: Self Care Deficits Care Plan (Adult)  Goal: *Acute Goals and Plan of Care (Insert Text)  Description: FUNCTIONAL STATUS PRIOR TO ADMISSION: Patient was modified independent using a single point cane for functional mobility. She reports able to care for self prior to recent hospitalization and rehab stay.    HOME SUPPORT: The patient lived with son and daughter in law but did not require assist.    Occupational Therapy Goals  Initiated 05/13/2021, Reviewed for weekly assessment, goals remain appropriate  1.  Patient will perform lower body dressing with modified independence within 7 day(s).  2.  Patient will perform lower body dressing with modified independence within 7 day(s).  3.  Patient will perform toilet transfers with modified independence within 7 day(s).  4.  Patient will perform all aspects of toileting with modified independence within 7 day(s).  5.  Patient will participate in upper extremity therapeutic exercise/activities with supervision/set-up for 10 minutes within 7 day(s).    6.  Patient will utilize energy conservation techniques during functional activities with verbal cues within 7 day(s).    Outcome: Progressing Towards Goal   OCCUPATIONAL THERAPY TREATMENT  Patient: Deborah [name] (66 y.o. female)  Date: 05/23/2021  Diagnosis: Acute on chronic heart failure with preserved ejection fraction (HFpEF) (HCC) [I50.33] Acute on chronic heart failure with preserved ejection fraction (HFpEF) (HCC)      Precautions: Contact, Fall  Chart, occupational therapy assessment, plan of care, and goals were reviewed.    ASSESSMENT  Patient continues with skilled OT services and is progressing towards goals.  Ms. Mavis was received in the chair requesting to use the bathroom.  She continues to be limited by hypotension despite being on low dose pressor support (neo).  Patient limited to [facility] use d/t low BP prior to activity.  She performs transfer, hygiene, and clothing management with CGA.  Education  provided regarding adaptive ADL techniques and safe transfer techniques including pacing.  Patient returned to the chair at end of tx and agreeable to remain up at end of tx.  Spoke with RN and Cardiology NP regarding potential use of compression stockings in future treatments for low BP; NP will place order as appropriate.  Patient would benefit from continued skilled OT to progress towards goals and improve overall independence.      Patient Vitals during OT tx:   Position  Pulse BP SpO2 (2L)   05/23/21 1121 Sitting; post activity  68 (!) 107/55 95 %   05/23/21 1116  Sitting 99  89/37  92%   05/23/21 1112  Sitting; after transfer to [facility] 98 61/46  93%   05/23/21 1104 Sitting  85 (!) 97/46 94 %   05/23/21 1046 Sitting 85 (!) 95/44 95 %           Current Level of Function Impacting Discharge (ADLs): Functional mobility, CGA; Toileting, CGA; Grooming, setup           PLAN :  Patient continues to benefit from skilled intervention to address the above impairments.  Continue treatment per established plan of care to address goals.    Recommend with staff:   Recommend patient be OOB to chair as frequently as tolerated; Goal of 3x/day for all meals for 60 minutes at a time.   For toileting needs, recommend transfers to/from [facility] with x1 person assist.  Encourage patient involvement in personal care as able.    Encourage exercises frequently throughout the day.       Recommend next OT session:  Continue towards set OT goals.      Recommendation for discharge: (in order for the patient to meet his/her long term goals)  Occupational therapy at least 2 days/week in the home     This discharge recommendation:  Has been made in collaboration with the attending provider and/or case management    IF patient discharges home will need the following DME: none       SUBJECTIVE:   Patient agreeable to OT tx.        OBJECTIVE DATA SUMMARY:   Cognitive/Behavioral Status:  Neurologic State: Alert  Orientation Level: Oriented X4  Cognition:  Appropriate decision making;Appropriate for age attention/concentration;Appropriate safety awareness;Follows commands  Perception: Appears intact  Perseveration: No perseveration noted  Safety/Judgement: Awareness of environment    Functional Mobility and Transfers for ADLs:  Bed Mobility:  Rolling:  (pt was received up and remained up at end of tx)  Scooting: Contact guard assistance    Transfers:  Sit to Stand: Contact guard assistance  Functional Transfers  Toilet Transfer : Contact guard assistance  Bed to Chair: Contact guard assistance    Balance:  Sitting: Intact  Standing: Impaired  Standing - Static: Good  Standing - Dynamic : Fair    ADL Intervention:  Grooming  Grooming Assistance: Set-up  Position Performed: Seated in chair  Washing Face: Set-up  Washing Hands: Modified independent    Toileting  Toileting Assistance: Contact guard assistance  Bladder Hygiene: Contact guard assistance  Bowel Hygiene: Contact guard assistance  Clothing Management: Contact guard assistance  Cues: Verbal cues provided  Adaptive Equipment: Grab bars    Cognitive Retraining  Safety/Judgement: Awareness of environment    Activity Tolerance:   Good and Fair    After treatment patient left in no apparent distress:   Sitting in chair, Call bell within reach, and Bed / chair alarm activated    COMMUNICATION/COLLABORATION:   The patient's plan of care was discussed with: Physical therapist, Registered nurse, and patient .     Waunita Schooner, OTR/L  Time Calculation: 29 mins

## 2021-05-23 NOTE — Progress Notes (Unsigned)
[facility]     Name: Deborah [name] MRN: [id number]   DOB: [birth year] Hospital: [facility]   Date: 05/23/2021        Impression Plan   Acute respiratory failure  Hypoxia  Hypercapnea  Pulmonary infiltrates  ESBL UTI  OSA  Morbid obesity               Wean O2 to keep sats above 90%  Continue BiPAP nightly and when napping  Diuresis as BP allows. Pt currently still on Neo with borderline BP.   Continue midodrine  Duonebs  Continue meropenem for ESBL UTI  OOB into chair  PT/OT as BP allows  Needs outpatient sleep eval appt with CPAP interrogation            Radiology  (personally reviewed) CXR4/20/23: basilar infiltrates-overall improving       Subjective     Cc: shortness of breath    66 yo with PMHx diastolic CHF, recent strep bacteremia and OSA presenting with increasing SOB. Found to have increased leg swelling and increased infiltrates after missing several doses of diuretics at rehab center. Clinical scenario complicated further by ESBL UTI and septic shock. Pt has hx of mild asthma, denies COPD. Smoked 1 ppd for 25 years, quit over 10 years ago.     Interval history  Afebrile  BP soft/stable; on 9 of neo  Sats 99% on 2L  Slept with BiPAP overnight  Bicarb 33 - trending down  Creat 1.08 - better  proBNP 2978 - down from 4152   Blood cx no growth x 5 days - final  2975 ml UOP    4/25 CXR: Pulmonary edema unchanged    4/25 ECHO: EF 60-65%; technically difficult with poor endocardial visualization    Review of Systems: Sleepy again this morning but arouses easily to voice.  States she woke up for a while at 4am and now is tired.  Voices no specific complaints.  Denies SOB.  Does reports some back pain.      A comprehensive review of systems was negative except for that written in the HPI.    Past Medical History:   Diagnosis Date    (HFpEF) heart failure with preserved ejection fraction (HCC)     Anxiety and depression     Aortic valve replaced     S/p bovine aortic valve  replacement.    Asthma     Chronic narcotic use     Chronic obstructive pulmonary disease (HCC)     Chronic pain     CKD (chronic kidney disease), stage III (HCC)     Baseline creatinine is 1.3-1.4 with GFR in the 40s.    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)     GERD (gastroesophageal reflux disease)     History of vascular access device 04/13/2021    4 FR Single PICC for LTABX: R cephalic vessell length 48 CM Max P leave @ 1 CM out; Arm circumferenc 40 CM    Hyperlipidemia     Hypothyroidism     Morbid obesity (HCC)     Neuropathy     Obstructive sleep apnea     Rhinitis       Past Surgical History:   Procedure Laterality Date    COLONOSCOPY N/A 12/01/2020    COLONOSCOPY performed by Ian Malkin, MD at [facility] ENDOSCOPY    HX AORTIC VALVE REPLACEMENT      Bovine bioprosthetic    HX PACEMAKER  Prior to Admission medications    Medication Sig Start Date End Date Taking? Authorizing Provider   bumetanide (BUMEX) 1 mg tablet Take 1 Tablet by mouth daily. 04/25/21  Yes Horald Chestnut, DO   lidocaine 4 % patch Apply to low back  Apply patch to the affected area for 12 hours a day and remove for 12 hours a day. 04/24/21  Yes Horald Chestnut, DO   arformoteroL (BROVANA) 15 mcg/2 mL nebu neb solution 2 mL by Nebulization route two (2) times a day. 04/24/21  Yes Horald Chestnut, DO   miconazole (MICOTIN) 2 % topical powder Apply  to affected area two (2) times a day. APPLY TO bilateral breasts    Nursing, document site in comments 04/24/21  Yes Jamil, Arna Medici, DO   atorvastatin (LIPITOR) 10 mg tablet Take 1 Tablet by mouth daily.   Yes Provider, Historical   metoprolol succinate (TOPROL-XL) 25 mg XL tablet Take 1 Tablet by mouth daily. 02/21/21  Yes Thurston Pounds, MD   levothyroxine (SYNTHROID) 150 mcg tablet Take 1 Tablet by mouth Daily (before breakfast). 01/27/21  Yes Tefera, Mesfin, MD   aspirin 81 mg chewable tablet Take 1 Tablet by mouth daily. 12/03/20  Yes Carrolyn Meiers, MD   acetaminophen (TYLENOL) 500 mg tablet Take 2 Tablets by  mouth every six (6) hours as needed for Pain.   Yes Provider, Historical   albuterol (PROVENTIL HFA, VENTOLIN HFA, PROAIR HFA) 90 mcg/actuation inhaler Take 2 Puffs by inhalation every six (6) hours as needed.   Yes Provider, Historical   citalopram (CELEXA) 40 mg tablet Take 1 Tablet by mouth daily. 02/22/20  Yes Provider, Historical   fluticasone propionate (FLONASE) 50 mcg/actuation nasal spray 2 Sprays by Nasal route daily as needed.   Yes Provider, Historical   sucralfate (CARAFATE) 1 gram tablet Take 1 Tablet by mouth three (3) times daily.   Yes Provider, Historical   traZODone (DESYREL) 100 mg tablet Take 1 Tablet by mouth nightly. 12/31/19  Yes Provider, Historical   glimepiride (AMARYL) 2 mg tablet Take 1 Tablet by mouth two (2) times a day.   Yes Provider, Historical   montelukast (SINGULAIR) 10 mg tablet Take 1 Tablet by mouth nightly.   Yes Provider, Historical   cefTRIAXone 2 gram 2 g IV syringe 2 g by IntraVENous route every twenty-four (24) hours for 29 days. 04/24/21 05/23/21  Horald Chestnut, DO   naloxone (Narcan) 4 mg/actuation nasal spray Use 1 spray intranasally, then discard. Repeat with new spray every 2 min as needed for opioid overdose symptoms, alternating nostrils. 04/24/21   Horald Chestnut, DO     Current Facility-Administered Medications   Medication Dose Route Frequency    midodrine (PROAMATINE) tablet 10 mg  10 mg Oral TID WITH MEALS    amiodarone (CORDARONE) tablet 400 mg  400 mg Oral BID    bumetanide (BUMEX) tablet 1 mg  1 mg Oral DAILY    meropenem (MERREM) 1 g in 0.9% sodium chloride (MBP/ADV) 50 mL MBP  1 g IntraVENous Q8H    L.acidophilus-paracasei-S.thermophil-bifidobacter (RISAQUAD) 8 billion cell capsule  1 Capsule Oral DAILY    PHENYLephrine (NEO-SYNEPHRINE) 50 mg in 0.9% sodium chloride 250 mL (Vial2Bag)  10-100 mcg/min IntraVENous TITRATE    albuterol-ipratropium (DUO-NEB) 2.5 MG-0.5 MG/3 ML  3 mL Nebulization Q6H RT    pantoprazole (PROTONIX) tablet 40 mg  40 mg Oral ACB    heparin  (porcine) injection 5,000 Units  5,000 Units SubCUTAneous Q8H    [Held by  provider] metoprolol succinate (TOPROL-XL) XL tablet 25 mg  25 mg Oral DAILY    acetaZOLAMIDE (DIAMOX) tablet 250 mg  250 mg Oral BID    nystatin (MYCOSTATIN) 100,000 unit/gram cream   Topical BID    traZODone (DESYREL) tablet 100 mg  100 mg Oral QHS    insulin lispro (HUMALOG) injection   SubCUTAneous AC&HS    [Held by provider] arformoteroL (BROVANA) neb solution 15 mcg  15 mcg Nebulization BID RT    budesonide (PULMICORT) 500 mcg/2 ml nebulizer suspension  500 mcg Nebulization BID RT    sodium chloride (NS) flush 5-40 mL  5-40 mL IntraVENous Q8H    aspirin chewable tablet 81 mg  81 mg Oral DAILY    atorvastatin (LIPITOR) tablet 10 mg  10 mg Oral DAILY    citalopram (CELEXA) tablet 40 mg  40 mg Oral DAILY    [Held by provider] glipiZIDE (GLUCOTROL) tablet 5 mg  5 mg Oral BID WITH MEALS    levothyroxine (SYNTHROID) tablet 150 mcg  150 mcg Oral ACB    montelukast (SINGULAIR) tablet 10 mg  10 mg Oral QHS    sucralfate (CARAFATE) tablet 1 g  1 g Oral TID     Allergies   Allergen Reactions    Nitroglycerin Unknown (comments)     hypotension    Aloe Vera Rash    Hydrochlorothiazide Other (comments)     Reports 'kidneys dry up"     Tetanus And Diphther. Tox (Pf) Swelling     Swelling of arm and it turns black      Social History     Tobacco Use    Smoking status: Former     Packs/day: 1.00     Years: 40.00     Pack years: 40.00     Types: Cigarettes     Quit date: 03/21/2010     Years since quitting: 11.1    Smokeless tobacco: Never   Substance Use Topics    Alcohol use: Not Currently      Family History   Problem Relation Age of Onset    Hypertension Mother     Hypertension Father           Laboratory: I have personally reviewed the flowsheet and labs.     Recent Labs     05/23/21  0338 05/22/21  0145 05/21/21  0333   WBC 6.9 10.0 5.0   HGB 9.0* 9.6* 8.6*   HCT 30.9* 32.0* 29.1*   PLT 90* 114* 72*       Recent Labs     05/23/21  0338 05/22/21  0145  05/21/21  0333   NA 139 137 139   K 3.8 4.1 3.7   CL 106 101 99   CO2 33* 36* 36*   GLU 122* 130* 119*   BUN 26* 35* 40*   CREA 1.08* 1.23* 1.21*   CA 9.0 8.9 9.0   MG 2.2 2.2 2.2   PHOS 3.3 3.0 3.6         Objective:   Visit Vitals  BP (!) 105/51   Pulse 86   Temp 98.9 F (37.2 C)   Resp 21   Ht  (1.702 m)   Wt 154.5 kg (340 lb 11.2 oz)   SpO2 99%   BMI 53.36 kg/m       Intake/Output Summary (Last 24 hours) at 05/23/2021 0815  Last data filed at 05/23/2021 0651  Gross per 24 hour   Intake 240  ml   Output 2450 ml   Net -2210 ml       EXAM:   GENERAL: sleepy but wakes easily, alert, obese HEENT:  anicteric, EOMI, no alar flaring or epistaxis, oral mucosa moist without cyanosis, NECK:  no jugular vein distention, no retractions, no thyromegaly or masses, LUNGS: distant, CTA, no w/r/rHEART:  Regular rate and rhythm with no MGR; +2 edema is present in LE, ABDOMEN:  soft with no tenderness, EXTREMITIES:  warm with no cyanosis, SKIN:  no jaundice or ecchymosis, and NEUROLOGIC:  alert and oriented, grossly non-focal    Deborah KinsmanElizabeth F Besnik Febus, PA  [facility]

## 2021-05-23 NOTE — Progress Notes (Signed)
Progress  Notes by Dayna Barker, MD at 05/23/21 1011                Author: Dayna Barker, MD  Service: Internal Medicine  Author Type: Physician       Filed: 05/23/21 1013  Date of Service: 05/23/21 1011  Status: Signed          Editor: Dayna Barker, MD (Physician)               [place] [facility]   [address] [place], [place], [place] [address]   [phone]         Hospitalist Progress Note         NAME: Deborah [name]    DOB:  03-21-1955   MRM:  [id number]      Date/Time of service: 05/23/2021  10:11 AM               Subjective:        Chief Complaint:  Patient was personally seen and examined by me during this time period.  Chart reviewed.  Unable to wean off pressors.  Dyspnea  improving               Objective:             Vitals:           Last 24hrs VS reviewed since prior progress note. Most recent are:      Visit Vitals      BP  (!) 68/48     Pulse  88     Temp  98.9 F (37.2 C)     Resp  25     Ht   (1.702 m)     Wt  154.5 kg (340 lb 11.2 oz)     SpO2  97%        BMI  53.36 kg/m        SpO2 Readings from Last 6 Encounters:      05/23/21  97%      04/24/21  97%      03/09/21  96%      01/26/21  90%      12/03/20  96%      11/05/20  92%           O2 Flow Rate (L/min): 2 l/min        Intake/Output Summary (Last 24 hours) at 05/23/2021 1011   Last data filed at 05/23/2021 2130     Gross per 24 hour        Intake  240 ml        Output  2450 ml        Net  -2210 ml                 Exam:        Physical Exam:      Gen:  Well-developed, well-nourished, morbidly obese, in no acute distress   HEENT:  Pink conjunctivae, PERRL, hearing intact to voice, moist mucous membranes   Neck:  Supple, without masses, thyroid non-tender   Resp:  No accessory muscle use, clear breath sounds without wheezes rales or rhonchi   Card:  No murmurs, normal S1, S2 without thrills, 3+ edema   Abd:  Soft, non-tender, non-distended, normoactive bowel sounds are present   Musc:  No cyanosis or clubbing   Skin:  No  rashes   Neuro:  Cranial nerves 3-12 are grossly intact,  generalized weakness, follows commands appropriately   Psych:  poor insight, oriented to person, place and time, alert      Medications Reviewed: (see below)      Lab Data Reviewed: (see below)      ______________________________________________________________________        Medications:          Current Facility-Administered Medications          Medication  Dose  Route  Frequency           ?  midodrine (PROAMATINE) tablet 10 mg   10 mg  Oral  TID WITH MEALS     ?  amiodarone (CORDARONE) tablet 400 mg   400 mg  Oral  BID     ?  bumetanide (BUMEX) tablet 1 mg   1 mg  Oral  DAILY     ?  meropenem (MERREM) 1 g in 0.9% sodium chloride (MBP/ADV) 50 mL MBP   1 g  IntraVENous  Q8H     ?  L.acidophilus-paracasei-S.thermophil-bifidobacter (RISAQUAD) 8 billion cell capsule   1 Capsule  Oral  DAILY     ?  PHENYLephrine (NEO-SYNEPHRINE) 50 mg in 0.9% sodium chloride 250 mL (Vial2Bag)   10-100 mcg/min  IntraVENous  TITRATE           ?  albuterol-ipratropium (DUO-NEB) 2.5 MG-0.5 MG/3 ML   3 mL  Nebulization  Q6H RT           ?  pantoprazole (PROTONIX) tablet 40 mg   40 mg  Oral  ACB     ?  heparin (porcine) injection 5,000 Units   5,000 Units  SubCUTAneous  Q8H     ?  [Held by provider] metoprolol succinate (TOPROL-XL) XL tablet 25 mg   25 mg  Oral  DAILY     ?  acetaZOLAMIDE (DIAMOX) tablet 250 mg   250 mg  Oral  BID     ?  nystatin (MYCOSTATIN) 100,000 unit/gram cream     Topical  BID     ?  traZODone (DESYREL) tablet 100 mg   100 mg  Oral  QHS     ?  oxyCODONE IR (ROXICODONE) tablet 5 mg   5 mg  Oral  Q6H PRN     ?  albuterol-ipratropium (DUO-NEB) 2.5 MG-0.5 MG/3 ML   3 mL  Nebulization  Q4H PRN     ?  insulin lispro (HUMALOG) injection     SubCUTAneous  AC&HS     ?  glucose chewable tablet 16 g   4 Tablet  Oral  PRN     ?  glucagon (GLUCAGEN) injection 1 mg   1 mg  IntraMUSCular  PRN     ?  dextrose 10% infusion 0-250 mL   0-250 mL  IntraVENous  PRN     ?  [Held by  provider] arformoteroL (BROVANA) neb solution 15 mcg   15 mcg  Nebulization  BID RT     ?  budesonide (PULMICORT) 500 mcg/2 ml nebulizer suspension   500 mcg  Nebulization  BID RT     ?  sodium chloride (NS) flush 5-40 mL   5-40 mL  IntraVENous  Q8H     ?  sodium chloride (NS) flush 5-40 mL   5-40 mL  IntraVENous  PRN     ?  acetaminophen (TYLENOL) tablet 650 mg   650 mg  Oral  Q6H PRN          Or           ?  acetaminophen (TYLENOL) suppository 650 mg   650 mg  Rectal  Q6H PRN     ?  polyethylene glycol (MIRALAX) packet 17 g   17 g  Oral  DAILY PRN     ?  senna (SENOKOT) tablet 8.6 mg   1 Tablet  Oral  DAILY PRN     ?  promethazine (PHENERGAN) tablet 12.5 mg   12.5 mg  Oral  Q6H PRN          Or           ?  ondansetron (ZOFRAN) injection 4 mg   4 mg  IntraVENous  Q6H PRN     ?  aspirin chewable tablet 81 mg   81 mg  Oral  DAILY     ?  atorvastatin (LIPITOR) tablet 10 mg   10 mg  Oral  DAILY     ?  citalopram (CELEXA) tablet 40 mg   40 mg  Oral  DAILY     ?  [Held by provider] glipiZIDE (GLUCOTROL) tablet 5 mg   5 mg  Oral  BID WITH MEALS     ?  levothyroxine (SYNTHROID) tablet 150 mcg   150 mcg  Oral  ACB     ?  montelukast (SINGULAIR) tablet 10 mg   10 mg  Oral  QHS           ?  sucralfate (CARAFATE) tablet 1 g   1 g  Oral  TID                 Lab Review:          Recent Labs             05/23/21   0338  05/22/21   0145  05/21/21   0333     WBC  6.9  10.0  5.0     HGB  9.0*  9.6*  8.6*     HCT  30.9*  32.0*  29.1*          PLT  90*  114*  72*             Recent Labs             05/23/21   0338  05/22/21   0145  05/21/21   0333     NA  139  137  139     K  3.8  4.1  3.7     CL  106  101  99     CO2  33*  36*  36*     GLU  122*  130*  119*     BUN  26*  35*  40*     CREA  1.08*  1.23*  1.21*     CA  9.0  8.9  9.0     MG  2.2  2.2  2.2          PHOS  3.3  3.0  3.6             Lab Results         Component  Value  Date/Time            Glucose (POC)  147 (H)  05/23/2021 07:43 AM       Glucose (POC)  317 (H)  05/22/2021  08:47 PM       Glucose (POC)  197 (H)  05/22/2021 03:57 PM       Glucose (POC)  215 (H)  05/22/2021 11:06 AM            Glucose (POC)  123 (H)  05/22/2021 07:15 AM                    Assessment / Plan:        66 yo hx of HTN, DM, dCHF EF 50-55%, PVCs s/p pacer, bioprosthetic AVR, recent group B strep bacteremia, presented w/ hypoxia, dCHF, hypotension, ESBL UTI      1) Acute resp failure/hypoxia/hypercapnea: due to CHF, OSA, obesity hypoventilation.  Will cont O2, diamox prn, bipap.  Pulm following      2) Shock: POA, still present.  Likely due to sepsis vs adrenal insuff?  No evidence of adrenal insuff or cardiogenic shock.  Echo with EF 60-65%.  Will cont pressors gtt, wean as tolerated.  Cont midodrine.  Check cortisol       3) ESBL E. Coli UTI/recent group B strep bacteremia: was receiving IV CTX at home (till 04/26).  Now on IV meropenem.  Will defer to ID regarding length of abx      4) Acute on chronic dCHF: EF 60-65%%.  Still with significant edema.  Cont bumex, diamox.  Cards following      5) PVCs/bioprosthetic AVR: s/p pacer.  Cont ASA, statin      6) Anemia/thrombocytopenia: likely due to sepsis.  Has some iron def.  S/p IV iron.  Monitor CBC.  Consult Hematology if worse      7) DM type 2: A1C 5.7%.  Cont SSI      8) Weakness: cont PT/OT.  Will need HH      **Prior records, notes, labs, radiology, and medications reviewed in [facility]**      Total time spent with patient care: 35 Minutes **I personally saw and examined the patient during this time period**                   Care Plan discussed with: Patient, nursing       Discussed:  Care Plan      Prophylaxis:  Hep SQ      Disposition:  SNF/LTC vs HH              ___________________________________________________      Attending Physician: Dayna Barker, MD

## 2021-05-23 NOTE — Progress Notes (Signed)
0700: Bedside shift change report given to Tiffany, RN (Soil scientist) by Ander Purpura, RN (offgoing nurse). Report included the following information SBAR, Kardex, ED Summary, Procedure Summary, Intake/Output, MAR, Recent Results, and Med Rec Status.    1924: Dr. Lazaro Arms going to check computer for lab cortisol results no need for night RN to notify.

## 2021-05-23 NOTE — Consults (Signed)
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  Infection was thought to be due to left great toe wound. Per cardiology no plans for device removal.  Cardiology recommended course of antibiotics with repeat imaging once antibiotics are complete. In light of prosthetic valve patient was treated with 6-week course of IV ceftriaxone which was scheduled to be completed on 05/23/2021.  Gentamicin not administered due to CKD.  Further antibiotics as outlined below.  Cardiology following at this admission.  Defer further imaging plans to cardiology team  ESBL E. coli UTI.  Continue meropenem through 4/29  Hypotension.  Patient requiring vasopressors. Doubt due to ongoing infection.  Continue meropenem through 4/29 then monitor off antibiotics  Acute hypoxic hypercapnic respiratory failure.  Suspect pulmonary edema.  Pulmonary following   History of bioprosthetic aortic valve replacement and pacemaker.   SVT.  On amiodarone.    Poor dentition with recent tooth loss.      Anti-infectives:   Meropenem 4/23-    Ceftriaxone 4/13 - 4/23    Subjective:   Date of Consultation:  May 23, 2021  Date of Admission: 05/09/2021   Referring Physician:     Patient is a 66 y.o. female who is being seen for bacteremia.  Patient is well-known to service.  Discharged from [facility] on 04/24/2020 at rehabilitation facility.  She was previously hospitalized with a  GBS bacteremia complicated by pacemaker line vegetation.  Infection was thought to be due to left great toe wound.  Per cardiology no plans for device removal.  They recommend course of antibiotics with repeat imaging once antibiotics are complete.    In light of prosthetic valve patient treated with 6-week course of IV ceftriaxone which was scheduled to be completed on 05/23/2021.      Patient returns to [facility] on 05/09/2021 with shortness of breath and bilateral lower extremity swelling.   Hospital course has been complicated by acute hypoxic hypercapnic respiratory failure and ongoing hypotension on vasopressors.    Blood cultures patient has been sterile.  Urine culture is currently ESBL producing E. coli.  She is currently on meropenem.  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    Chronic narcotic use F11.90    Chronic pain G89.29    Rhinitis J31.0    Hyperlipidemia E78.5    Neuropathy G62.9    GERD (gastroesophageal reflux disease) K21.9    CHF (congestive heart failure) (HCC) I50.9    Elevated bilirubin R17    Liver cirrhosis (HCC) K74.60    Colitis K52.9    Acute exacerbation of CHF (congestive heart failure) (HCC) I50.9    Acute on chronic diastolic (congestive) heart failure (HCC) I50.33    PVC (premature ventricular contraction) I49.3    Systemic inflammatory response syndrome (SIRS) (HCC) R65.10    Acute on chronic heart failure with preserved ejection fraction (HFpEF) (HCC) I50.33    Bacteremia due to group  B Streptococcus R78.81, B95.1    Chronic respiratory failure with hypoxia (HCC) J96.11    BMI 50.0-59.9, adult (HCC) Z68.43    Hypokalemia due to loss of potassium E87.6    Acute on chronic respiratory failure with hypoxia and hypercapnia (HCC) J96.21, J96.22    OSA (obstructive sleep apnea) G47.33    COPD (chronic obstructive pulmonary disease) (HCC) J44.9    History of PSVT (paroxysmal supraventricular tachycardia) Z86.79    Anemia D64.9    Hypomagnesemia E83.42     Past Medical History:   Diagnosis Date    (HFpEF) heart failure with preserved ejection fraction (HCC)      Anxiety and depression     Aortic valve replaced     S/p bovine aortic valve replacement.    Asthma     Chronic narcotic use     Chronic obstructive pulmonary disease (HCC)     Chronic pain     CKD (chronic kidney disease), stage III (HCC)     Baseline creatinine is 1.3-1.4 with GFR in the 40s.    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)     GERD (gastroesophageal reflux disease)     History of vascular access device 04/13/2021    4 FR Single PICC for LTABX: R cephalic vessell length 48 CM Max P leave @ 1 CM out; Arm circumferenc 40 CM    Hyperlipidemia     Hypothyroidism     Morbid obesity ([place])     Neuropathy     Obstructive sleep apnea     Rhinitis       Family History   Problem Relation Age of Onset    Hypertension Mother     Hypertension Father       Social History     Tobacco Use    Smoking status: Former     Packs/day: 1.00     Years: 40.00     Pack years: 40.00     Types: Cigarettes     Quit date: 03/21/2010     Years since quitting: 11.1    Smokeless tobacco: Never   Substance Use Topics    Alcohol use: Not Currently     Past Surgical History:   Procedure Laterality Date    COLONOSCOPY N/A 12/01/2020    COLONOSCOPY performed by Rachael Darby, MD at [facility] ENDOSCOPY    HX AORTIC VALVE REPLACEMENT      Bovine bioprosthetic    HX PACEMAKER        Prior to Admission medications    Medication Sig Start Date End Date Taking? Authorizing Provider   bumetanide (BUMEX) 1 mg tablet Take 1 Tablet by mouth daily. 04/25/21  Yes Haskel Schroeder, DO   lidocaine 4 % patch Apply to low back  Apply patch to the affected area for 12 hours a day and remove for 12 hours a day. 04/24/21  Yes Haskel Schroeder, DO   arformoteroL (BROVANA) 15 mcg/2 mL nebu neb solution 2 mL by Nebulization route two (2) times a day. 04/24/21  Yes Haskel Schroeder, DO   miconazole (MICOTIN) 2 % topical powder Apply  to affected area two (2) times a day. APPLY TO bilateral breasts    Nursing, document site in comments 04/24/21  Yes Jamil, Alinda Sierras, DO   atorvastatin (LIPITOR)  10 mg tablet Take 1 Tablet by mouth daily.   Yes Provider, Historical   metoprolol succinate (TOPROL-XL) 25 mg XL tablet Take 1 Tablet by mouth daily. 02/21/21  Yes Shelle Iron, MD  levothyroxine (SYNTHROID) 150 mcg tablet Take 1 Tablet by mouth Daily (before breakfast). 01/27/21  Yes Tefera, Mesfin, MD   aspirin 81 mg chewable tablet Take 1 Tablet by mouth daily. 12/03/20  Yes Derenda Mis, MD   acetaminophen (TYLENOL) 500 mg tablet Take 2 Tablets by mouth every six (6) hours as needed for Pain.   Yes Provider, Historical   albuterol (PROVENTIL HFA, VENTOLIN HFA, PROAIR HFA) 90 mcg/actuation inhaler Take 2 Puffs by inhalation every six (6) hours as needed.   Yes Provider, Historical   citalopram (CELEXA) 40 mg tablet Take 1 Tablet by mouth daily. 02/22/20  Yes Provider, Historical   fluticasone propionate (FLONASE) 50 mcg/actuation nasal spray 2 Sprays by Nasal route daily as needed.   Yes Provider, Historical   sucralfate (CARAFATE) 1 gram tablet Take 1 Tablet by mouth three (3) times daily.   Yes Provider, Historical   traZODone (DESYREL) 100 mg tablet Take 1 Tablet by mouth nightly. 12/31/19  Yes Provider, Historical   glimepiride (AMARYL) 2 mg tablet Take 1 Tablet by mouth two (2) times a day.   Yes Provider, Historical   montelukast (SINGULAIR) 10 mg tablet Take 1 Tablet by mouth nightly.   Yes Provider, Historical   cefTRIAXone 2 gram 2 g IV syringe 2 g by IntraVENous route every twenty-four (24) hours for 29 days. 04/24/21 05/23/21  Haskel Schroeder, DO   naloxone (Narcan) 4 mg/actuation nasal spray Use 1 spray intranasally, then discard. Repeat with new spray every 2 min as needed for opioid overdose symptoms, alternating nostrils. 04/24/21   Haskel Schroeder, DO     Allergies   Allergen Reactions    Nitroglycerin Unknown (comments)     hypotension    Aloe Vera Rash    Hydrochlorothiazide Other (comments)     Reports 'kidneys dry up"     Tetanus And Diphther. Tox (Pf) Swelling     Swelling of arm and it turns black         Review of Systems:  A comprehensive review of systems was negative except for that written in the History of Present Illness.    Objective:   Blood pressure (!) 95/39, pulse 85, temperature 98.4 F (36.9 C), resp. rate 22, height '5\' 7"'  (1.702 m), weight 340 lb 11.2 oz (154.5 kg), SpO2 94 %.  Temp (24hrs), Avg:98.3 F (36.8 C), Min:97.6 F (36.4 C), Max:98.9 F (37.2 C)       Exam:    General:  Alert, cooperative, well noursished, well developed, appears stated age   Eyes:  Sclera anicteric.   Mouth/Throat: Mucous membranes normal   Neck: Supple   Lungs:   No distress   CV:     Abdomen:   Obese   Extremities: Moves all   Skin: No acute rash on exposed skin   Lymph nodes:    Musculoskeletal:    Lines/Devices:  Intact, no erythema, drainage or tenderness   Psych: Alert and oriented, normal mood affect given the setting       Data Review:   Recent Results (from the past 24 hour(s))   GLUCOSE, POC    Collection Time: 05/22/21  3:57 PM   Result Value Ref Range    Glucose (POC) 197 (H) 65 - 117 mg/dL    Performed by [facility] (PCT)    ECHO ADULT COMPLETE    Collection Time: 05/22/21  7:09 PM   Result Value Ref Range    IVSd 1.1 (A) 0.6 - 0.9 cm  LVIDd 4.9 3.9 - 5.3 cm    LVIDs 3.8 cm    LVPWd 1.2 (A) 0.6 - 0.9 cm    LV EDV A2C 78 mL    LV EDV A4C 108 mL    LV EDV BP 93 56 - 104 mL    LV ESV A2C 12 mL    LV ESV A4C 23 mL    LV ESV BP 17 (A) 19 - 49 mL    LVOT Peak Gradient 4 mmHg    LVOT Mean Gradient 2 mmHg    LVOT Peak Velocity 1.0 m/s    LVOT VTI 29.0 cm    RV Free Wall Peak S' 8 cm/s    LA Volume A/L 127 mL    LA Volume 2C 167 (A) 22 - 52 mL    LA Volume 4C 84 (A) 22 - 52 mL    LA Volume BP 117 (A) 22 - 52 mL    AV Peak Gradient 56 mmHg    AV Mean Gradient 34 mmHg    AV Peak Velocity 3.7 m/s    AV Mean Velocity 2.8 m/s    AV VTI 75.7 cm    MV A Velocity 0.93 m/s    MV E Wave Deceleration Time 137.1 ms    MV E Velocity 1.63 m/s    LV E' Lateral Velocity 8 cm/s    LV E' Septal Velocity 6 cm/s    PV Peak  Gradient 4 mmHg    PV Max Velocity 1.0 m/s    TR Peak Gradient 18 mmHg    TR Max Velocity 2.07 m/s    Fractional Shortening 2D 22 28 - 44 %    LV ESV Index BP 7 mL/m2    LV EDV Index BP 37 mL/m2    LV ESV Index A4C 9 mL/m2    LV EDV Index A4C 43 mL/m2    LV ESV Index A2C 5 mL/m2    LV EDV Index A2C 31 mL/m2    LVIDd Index 1.94 cm/m2    LVIDs Index 1.50 cm/m2    LV RWT Ratio 0.49     LV Mass 2D 213.3 (A) 67 - 162 g    LV Mass 2D Index 84.3 43 - 95 g/m2    MV E/A 1.75     E/E' Ratio (Averaged) 23.77     E/E' Lateral 20.38     E/E' Septal 27.17     LA Volume Index BP 46 (A) 16 - 34 ml/m2    LA Volume Index A/L 50 16 - 34 mL/m2    LA Volume Index 2C 66 (A) 16 - 34 mL/m2    LA Volume Index 4C 33 16 - 34 mL/m2    AV Velocity Ratio 0.27     LVOT:AV VTI Index 0.38    GLUCOSE, POC    Collection Time: 05/22/21  8:47 PM   Result Value Ref Range    Glucose (POC) 317 (H) 65 - 117 mg/dL    Performed by Wende Crease (CON)    CBC W/O DIFF    Collection Time: 05/23/21  3:38 AM   Result Value Ref Range    WBC 6.9 3.6 - 11.0 K/uL    RBC 3.33 (L) 3.80 - 5.20 M/uL    HGB 9.0 (L) 11.5 - 16.0 g/dL    HCT 30.9 (L) 35.0 - 47.0 %    MCV 92.8 80.0 - 99.0 FL    MCH 27.0 26.0 - 34.0 PG  MCHC 29.1 (L) 30.0 - 36.5 g/dL    RDW 16.7 (H) 11.5 - 14.5 %    PLATELET 90 (L) 150 - 400 K/uL    MPV 11.7 8.9 - 12.9 FL    NRBC 0.0 0 PER 100 WBC    ABSOLUTE NRBC 0.00 0.00 - 2.59 K/uL   METABOLIC PANEL, BASIC    Collection Time: 05/23/21  3:38 AM   Result Value Ref Range    Sodium 139 136 - 145 mmol/L    Potassium 3.8 3.5 - 5.1 mmol/L    Chloride 106 97 - 108 mmol/L    CO2 33 (H) 21 - 32 mmol/L    Anion gap 0 (L) 5 - 15 mmol/L    Glucose 122 (H) 65 - 100 mg/dL    BUN 26 (H) 6 - 20 MG/DL    Creatinine 1.08 (H) 0.55 - 1.02 MG/DL    BUN/Creatinine ratio 24 (H) 12 - 20      eGFR 57 (L) >60 ml/min/1.8m    Calcium 9.0 8.5 - 10.1 MG/DL   PHOSPHORUS    Collection Time: 05/23/21  3:38 AM   Result Value Ref Range    Phosphorus 3.3 2.6 - 4.7 MG/DL   MAGNESIUM     Collection Time: 05/23/21  3:38 AM   Result Value Ref Range    Magnesium 2.2 1.6 - 2.4 mg/dL   NT-PRO BNP    Collection Time: 05/23/21  3:38 AM   Result Value Ref Range    NT pro-BNP 2,978 (H) <125 PG/ML   GLUCOSE, POC    Collection Time: 05/23/21  7:43 AM   Result Value Ref Range    Glucose (POC) 147 (H) 65 - 117 mg/dL    Performed by [facility](PCT)    GLUCOSE, POC    Collection Time: 05/23/21 11:08 AM   Result Value Ref Range    Glucose (POC) 220 (H) 65 - 117 mg/dL    Performed by [facility]b(PCT)         Microbiology:      Studies:    Signed By: MValetta Close DO     May 23, 2021

## 2021-05-23 NOTE — Progress Notes (Signed)
Transition of Care Plan - RUR 29%:  Medical management continues  PT/OT following - TBD pending progress  Remains on IV abx, ceftriaxone through 4/26; meropenem- ID consulted  Pulmonology following  Cardiology following  Currently on 2L O2 nc  CM following- patient is a readmission.  She lives with her son and daughter-in-law.  HH has been arranged with [facility].     Alexis Goodell, LCSW

## 2021-05-23 NOTE — Progress Notes (Signed)
Problem: Mobility Impaired (Adult and Pediatric)  Goal: *Acute Goals and Plan of Care (Insert Text)  Description: FUNCTIONAL STATUS PRIOR TO ADMISSION: Patient was modified independent using a single point cane for household and community amb. Recent hospital admission and d/c to [facility] last 2+ weeks then readmit    HOME SUPPORT PRIOR TO ADMISSION: The patient lived with son's family but did not require assist.    Physical Therapy Goals  Revised 05/19/2021  1.  Patient will move from supine to sit and sit to supine , scoot up and down, and roll side to side in bed with modified independence within 7 day(s).  2.  Patient will transfer from bed to chair and chair to bed with modified independence using the least restrictive device within 7 day(s).  3.  Patient will perform sit to stand with modified independence within 7 day(s).  4.  Patient will ambulate with modified independence for 50 feet with the least restrictive device within 7 day(s).   5.  Patient will ascend/descend 5 stairs with 1 handrail(s) with modified independence within 7 day(s).         Physical Therapy Goals  Initiated 05/12/2021; reviewed 4/21 and remain appropriate as below   1.  Patient will move from supine to sit and sit to supine , scoot up and down, and roll side to side in bed with modified independence within 7 day(s).  NOT MET  2.  Patient will transfer from bed to chair and chair to bed with modified independence using the least restrictive device within 7 day(s). NOT MET  3.  Patient will perform sit to stand with modified independence within 7 day(s). NOT MET  4.  Patient will ambulate with modified independence for 50 feet with the least restrictive device within 7 day(s). NOT MET  5.  Patient will ascend/descend 5 stairs with 1 handrail(s) and SPC with modified independence within 7 day(s). NOT MET  Outcome: Progressing Towards Goal   PHYSICAL THERAPY TREATMENT  Patient: Deborah [name] (66 y.o. female)  Date: 05/23/2021  Diagnosis:  Acute on chronic heart failure with preserved ejection fraction (HFpEF) (HCC) [I50.33] Acute on chronic heart failure with preserved ejection fraction (HFpEF) ([place])      Precautions: Contact, Fall  Chart, physical therapy assessment, plan of care and goals were reviewed.    ASSESSMENT  Patient continues with skilled PT services and is progressing towards goals. Communicated with nurse cleared for therapy. Patient sitting on the recliner when received, assisted to and from the bedside commode, ambulate back to the recliner CGA after therapy performed some active range of motion exercise on both LE while sitting up on the chair. Educate and instructed patient to continue active range of motion exercise on both legs while up on chair or on bed multiple times. Recommend patient to be up on the chair at least 3 times a day every meal times as tolerated. Communicated with nurse who was in the room during therapy agreed to monitor patient.   Patient Vitals during OT tx:    Position  Pulse BP SpO2 (2L)   05/23/21 1121 Sitting; post activity  68 (!) 107/55 95 %   05/23/21 1116  Sitting 99  89/37  92%   05/23/21 1112  Sitting; after transfer to [facility] 98 61/46  93%   05/23/21 1104 Sitting  85 (!) 97/46 94 %   05/23/21 1046 Sitting 85 (!) 95/44 95 %      Current Level of Function Impacting  Discharge (mobility/balance): CGA with transfers and ambulation using a rolling walker    Other factors to consider for discharge: fall         PLAN :  Patient continues to benefit from skilled intervention to address the above impairments.  Continue treatment per established plan of care.  to address goals.    Recommendation for discharge: (in order for the patient to meet his/her long term goals)  Physical therapy at least 2 days/week in the home     This discharge recommendation:  Has been made in collaboration with the attending provider and/or case management    IF patient discharges home will need the following DME: patient owns DME  required for discharge       SUBJECTIVE:   Patient stated "ok."    OBJECTIVE DATA SUMMARY:   Critical Behavior:  Neurologic State: Alert  Orientation Level: Oriented X4  Cognition: Appropriate decision making, Appropriate for age attention/concentration, Appropriate safety awareness, Follows commands  Safety/Judgement: Awareness of environment  Functional Mobility Training:  Bed Mobility:  Rolling:  (pt was received up and remained up at end of tx)        Scooting: Contact guard assistance        Transfers:  Sit to Stand: Contact guard assistance  Stand to Sit: Contact guard assistance  Stand Pivot Transfers: Contact guard assistance     Bed to Chair: Contact guard assistance                    Balance:  Sitting: Intact  Standing: Impaired  Standing - Static: Good  Standing - Dynamic : Fair  Ambulation/Gait Training:  Distance (ft): 10 Feet (ft)  Assistive Device: Walker, rolling;Gait belt        Gait Description (WDL): Exceptions to WDL  Gait Abnormalities: Path deviations        Base of Support: Widened     Speed/Cadence: Pace decreased (<100 feet/min)  Step Length: Right shortened;Left shortened                      Therapeutic Exercises:   Educate and instructed patient to continue active range of motion exercise on both legs while up on chair or on bed multiple times. Recommend patient to be up on the chair at least 3 times a day every meal times as tolerated.       Pain Rating:  0/10    Activity Tolerance:   Good    After treatment patient left in no apparent distress:   Sitting in chair, Heels elevated for pressure relief, Call bell within reach, and Bed / chair alarm activated    COMMUNICATION/COLLABORATION:   The patient's plan of care was discussed with: Occupational therapist, Registered nurse, and Case management.     Leticia Clas, PT,WCC.   Time Calculation: 24 mins

## 2021-05-23 NOTE — Progress Notes (Signed)
Vascular Access Team:  Extended dwell Arrow endurance ultrasound guided IV placed in the left cephalic .  6 cm, by [employer].  Line has brisk blood return.  Secured with stat lock, dressed with large tegaderm, alcohol cap utilized.  Patient tolerated well.  Primary RN Tiffany made aware line is ready for use.      [place] Therapist, sports

## 2021-05-23 NOTE — Progress Notes (Signed)
Progress  Notes by Laddie Aquas, MD at 05/23/21 [phone]                Author: Laddie Aquas, MD  Service: Cardiology  Author Type: Physician       Filed: 05/23/21 2121  Date of Service: 05/23/21 0913  Status: Addendum          Editor: Laddie Aquas, MD (Physician)          Related Notes: Original Note by Sharla Kidney, NP (Nurse Practitioner) filed at 05/23/21 [phone]                                                                                     [facility]                     Cardiology Care Note       [] Initial Encounter      [x] Follow-up        Patient Name: Deborah [name] - DOB:[birth year] - VWU:[id number]   Primary Cardiologist: [facility] Physicians: Thurston Pounds MD   Consulting Cardiologist: [facility] Physicians: Dr Welton Flakes         Reason for encounter: CHF       HPI:         Deborah [name] is a 66 y.o. female with PMH of HF p EF, a fib, possible atrial lead vegetation treated abx, repeat ECHO with no evidence of vegetation with COPD, A/C resp failure who was recently discharged from hospital on  04/24/2021 to [facility] for rehab.       She says that she had not been receiving her Bumex on a consistent basis and missed several days dosing.       She started having a progressively worsening SOB associated with lower extremity swelling. She says she was using oxygen but required supplementation in the ED. She denies any cough or fever. No chills, nausea or vomiting.      Subjective:        Deborah [name] reports breathing is ok. Remains on neo          Assessment and Plan          1 A/C HF p EF: seems as if her diuretics were not being administered at SNF. Cont po loops as  able, on diamox. Repeat TTE w/ EF 60-65%      2 Hx a fib s/p PPM (not MRI compatible)/ s/p AVR: Has had bigeminy and intermittent episodes of SVT with Pacer tracking at 150 bpm - started  on PO amio. BB held due to hypotension . Not previously anticoagulated (thrombocytopenia).  No significant AV stenosis  on repeat TTE      3 Bacteremia due to group B Streptococcus: during recent admission, she had GBS bacteremia complicated by pacemaker line vegetation. Continue IV Ceftriaxone scheduled to run through  05/23/21.       4 Acute on chronic respiratory failure with hypoxia and hypercapnia/COPD: On NC        5 Hypotension: increased midodrine, unclear cause, normal EF on TTE. Cont to wean neo as able  6 T 2 DM       7 CKD : stable       8 Obesity: Body mass index is 53.36 kg/m.       9 Thrombocytopenia              CARDIOLOGY ATTENDING   Patient personally seen and examined. All the elements of history and examination were personally performed. Assessment and plan was discussed and agree.         + DOE No CP   Obese, NAD, Hrt RRR, 3/6 systolic murmur RSB, + obese, + LE edema    Tele - atrial paced with lots of PVC           1) Acute on chronic HFpEF   - p/w SOB, volume overload 04/08/21    - Echo 05/22/21 - LVEF 60%   - Will try to cont oral diuretics - Bumex and diamox - as long as BP allows    - Of note she has had problems hypotension and has been on phenylephrine (OK to hold diuretics if needed)        2) s/p AVR  - Echo 04/17/21 -  Aortic Valve: Not well visualized. Edwards bovine bioprosthetic valve with a size of 25 mm. Mild regurgitation. Stenosis of the aortic valve. AV mean gradient is 38 mmHg. AV area by continuity VTI is 1.0 cm2   - Echo 05/22/21 - shows some AVR stenosis, but likely not significant        3) History of Afib s/p PPM    - BB held due to hypotension .    - Not previously anticoagulated (thrombocytopenia).         4) Bacteremia due to group B Streptococcus: during recent admission, she had GBS bacteremia complicated by pacemaker line vegetation. Continue IV Ceftriaxone scheduled to run through 05/23/21.       5) Acute on chronic respiratory failure with hypoxia and hypercapnia/COPD:        6) Hypotension   - Echo 05/22/21 with LVEF 60-65%   - Trying midodrine to decrease use of pressors         7) Frequent PVC's    - has had this for a while    - Holter 08/2020 > 30 thousands PVC, 10 beat NSVT, brief PAT   - cardiolite OK 1/23    ---> start amiodarone load (unable to take BB due to hypotension)        Tharon Aquas, MD, [facility]            [x]     High complexity decision making was performed   [x]     Patient is at high-risk of decompensation with multiple  organ involvement              ____________________________________________________________      Cardiac testing   05/09/21      ECHO ADULT COMPLETE 05/22/2021 05/22/2021      Interpretation Summary   ?  Contrast used: Definity.   ?  Technical qualifiers: Echo study was technically difficult with poor endocardial visualization, limited due to patient's condition, limited  due to patient tolerance and technically difficult due to patient's heart rhythm.   ?  Left Ventricle: Normal left ventricular systolic function with a visually estimated EF of 60 - 65%. Left ventricle size is normal. Mildly  increased wall thickness. Unable to assess wall motion.   ?  Aortic Valve: Not well visualized. Bioprosthetic valve. AV mean gradient is 34  mmHg. LVOT:AV VTI Index is 0.38.  With a mean PG 34 mmHg,  DVI 0.38 and an intermediate jet counter, there is possibly some valve stenosis but likely not significant.      Signed by: Laddie Aquas, MD on 05/22/2021 11:13 PM         04/08/21      ECHO ADULT FOLLOW-UP OR LIMITED 04/17/2021 04/17/2021      Interpretation Summary   ?  Aortic Valve: Not well visualized. Edwards bovine bioprosthetic valve with a size of 25 mm. Mild regurgitation.  Stenosis of the aortic valve. AV mean gradient is 38 mmHg. AV area by continuity VTI is 1.0 cm2.   ?  Technical qualifiers: Echo study was technically difficult due to patient's body habitus.      Signed by: Candyce Churn, DO on 04/17/2021  5:00 PM         02/16/21      NUCLEAR CARDIAC STRESS TEST 02/20/2021 02/26/2021      Interpretation Summary   ?  Nuclear Findings: LV perfusion is normal. There is  no evidence of inducible ischemia.   ?  Nuclear Findings: The defect appears to be caused by breast attenuation.   ?  ECG: Resting ECG demonstrates normal sinus rhythm.   ?  ECG: Stress ECG was negative for ischemia.   ?  Stress Test: A pharmacological stress test was performed using lexiscan. Hemodynamics are adequate for diagnosis. Blood pressure demonstrated  a normal response and heart rate demonstrated a normal response to stress. The patient's heart rate recovery was normal. The patient reported dyspnea and no chest pain during the stress test.      Ventricular bigeminy   Unifocal PVC, RBBB and superior axis PVCs      Signed by: Thurston Pounds, MD on 02/20/2021  5:00 PM      Most recent HS troponins:   No results for input(s): TROPHS in the last 72 hours.      No lab exists for component:  CKMB            ECG:      EKG Results                  Procedure  720  Value  Units  Date/Time           EKG, 12 LEAD, INITIAL [[id number]]  Collected: 05/18/21 1916       Order Status: Completed  Updated: 05/19/21 1310                Ventricular Rate  123  BPM           Atrial Rate  57  BPM           QRS Duration  166  ms           Q-T Interval  392  ms           QTC Calculation (Bezet)  561  ms           Calculated R Axis  -73  degrees           Calculated T Axis  108  degrees                Diagnosis  --             Ventricular-paced rhythm   Abnormal ECG   When compared with ECG of 18-May-2021 01:25,   Vent. rate has increased BY   5 BPM  Confirmed by Barry Dieneswens, M.D., Jonny RuizJohn ([id number]) on 05/19/2021 1:10:36 PM              EKG, 12 LEAD, INITIAL [[id number]]  Collected: 05/18/21 0125       Order Status: Completed  Updated: 05/18/21 1556                Ventricular Rate  118  BPM           Atrial Rate  37  BPM           QRS Duration  172  ms           Q-T Interval  406  ms           QTC Calculation (Bezet)  569  ms           Calculated R Axis  -70  degrees           Calculated T Axis  104  degrees                Diagnosis  --              Ventricular-paced rhythm   Abnormal ECG   Confirmed by Barry Dieneswens, M.D., John ([id number]) on 05/18/2021 3:56:31 PM              EKG, 12 LEAD, INITIAL [[id number]]  Collected: 05/17/21 2134       Order Status: Completed  Updated: 05/18/21 1553                Ventricular Rate  107  BPM           Atrial Rate  107  BPM           P-R Interval  232  ms           QRS Duration  170  ms           Q-T Interval  362  ms           QTC Calculation (Bezet)  483  ms           Calculated R Axis  -71  degrees           Calculated T Axis  110  degrees                Diagnosis  --             Atrial-sensed ventricular-paced rhythm with prolonged AV conduction   Abnormal ECG   When compared with ECG of 11-May-2021 20:56,   Electronic ventricular pacemaker has replaced Electronic atrial pacemaker   Confirmed by Barry Dieneswens, M.D., John ([id number]) on 05/18/2021 3:53:20 PM              EKG, 12 LEAD, INITIAL [[id number]][[id number]]  Collected: 05/11/21 2056       Order Status: Completed  Updated: 05/14/21 1706                Ventricular Rate  84  BPM           Atrial Rate  84  BPM           P-R Interval  168  ms           QRS Duration  162  ms           Q-T Interval  388  ms           QTC Calculation (Bezet)  458  ms  Calculated P Axis  68  degrees           Calculated R Axis  -102  degrees           Calculated T Axis  11  degrees                Diagnosis  --             Sinus rhythm with frequent atrial-paced complexes and premature    supraventricular complexes in a pattern of bigeminy   Right bundle branch block   Septal infarct , age undetermined   T wave abnormality, consider lateral ischemia   Abnormal ECG      Confirmed by Barry Dienes, M.D., John ([id number]) on 05/14/2021 5:06:24 PM              EKG, 12 LEAD, INITIAL [[id number]]  Collected: 05/11/21 2005       Order Status: Completed  Updated: 05/14/21 1706                Ventricular Rate  116  BPM           Atrial Rate  116  BPM           P-R Interval  200  ms           QRS Duration  190  ms           Q-T Interval   368  ms           QTC Calculation (Bezet)  511  ms           Calculated R Axis  -72  degrees           Calculated T Axis  105  degrees                Diagnosis  --             Atrial-sensed ventricular-paced rhythm   Abnormal ECG   Confirmed by Barry Dienes, M.D., John ([id number]) on 05/14/2021 5:06:15 PM              EKG, 12 LEAD, INITIAL [[id number]]  Collected: 05/11/21 1531       Order Status: Completed  Updated: 05/14/21 1706                Ventricular Rate  83  BPM           Atrial Rate  83  BPM           P-R Interval  160  ms           QRS Duration  160  ms           Q-T Interval  368  ms           QTC Calculation (Bezet)  432  ms           Calculated P Axis  64  degrees           Calculated R Axis  -100  degrees           Calculated T Axis  16  degrees                Diagnosis  --             Sinus rhythm with frequent atrial-paced complexes and premature    supraventricular complexes in a pattern of bigeminy   Right bundle branch block   Possible Lateral infarct , age undetermined  Abnormal ECG   Confirmed by Barry Dienes, M.D., John ([id number]) on 05/14/2021 5:06:02 PM              EKG 12 LEAD INITIAL [[id number]]  Collected: 05/10/21 0635       Order Status: Completed  Updated: 05/12/21 1304                Ventricular Rate  80  BPM           Atrial Rate  60  BPM           P-R Interval  212  ms           QRS Duration  112  ms           Q-T Interval  526  ms           QTC Calculation (Bezet)  606  ms           Calculated P Axis  61  degrees           Calculated R Axis  -55  degrees           Calculated T Axis  -158  degrees                Diagnosis  --             Atrial-paced rhythm with prolonged AV conduction with frequent premature    ventricular complexes   Incomplete right bundle branch block   Left anterior fascicular block   Septal infarct , age undetermined   T wave abnormality, consider lateral ischemia   Prolonged QT   Abnormal ECG   When compared with ECG of 09-May-2021 20:16,   No significant change      Confirmed by  Rathi MD, Vikas ([id number]) on 05/12/2021 1:04:16 PM              EKG, 12 LEAD, INITIAL [[id number]]         Order Status: Sent         EKG 12 LEAD INITIAL [[id number]]  Collected: 05/09/21 2016       Order Status: Completed  Updated: 05/11/21 1349                Ventricular Rate  97  BPM           Atrial Rate  49  BPM           P-R Interval  160  ms           QRS Duration  160  ms           Q-T Interval  350  ms           QTC Calculation (Bezet)  444  ms           Calculated P Axis  67  degrees           Calculated R Axis  -117  degrees           Calculated T Axis  6  degrees                Diagnosis  --             Sinus bradycardia with marked sinus arrhythmia with frequent atrial-paced    complexes and with frequent and consecutive premature ventricular complexes   Right bundle branch block   Possible Lateral infarct , age undetermined   Abnormal ECG   When compared with ECG of  13-Apr-2021 22:14,   premature ventricular complexes are now present   premature atrial complexes are no longer present   Borderline criteria for Lateral infarct are now present   Confirmed by Rathi MD, Vikas [phone]) on 05/11/2021 1:49:45 PM                           Review of Systems:      [x] All other systems reviewed and all negative except as written in HPI      []  Patient unable to provide secondary  to condition        Past Medical History:        Diagnosis  Date         ?  (HFpEF) heart failure with preserved ejection fraction (HCC)       ?  Anxiety and depression       ?  Aortic valve replaced            S/p bovine aortic valve replacement.         ?  Asthma       ?  Chronic narcotic use       ?  Chronic obstructive pulmonary disease (HCC)       ?  Chronic pain       ?  CKD (chronic kidney disease), stage III (HCC)            Baseline creatinine is 1.3-1.4 with GFR in the 40s.         ?  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  GERD (gastroesophageal reflux disease)       ?  History of vascular access device  04/13/2021          4  FR Single PICC for LTABX: R cephalic vessell length 48 CM Max P leave @ 1 CM out; Arm circumferenc 40 CM         ?  Hyperlipidemia       ?  Hypothyroidism       ?  Morbid obesity (HCC)       ?  Neuropathy       ?  Obstructive sleep apnea           ?  Rhinitis            Past Surgical History:         Procedure  Laterality  Date          ?  COLONOSCOPY  N/A  12/01/2020          COLONOSCOPY performed by Ian Malkin, MD at [facility] ENDOSCOPY          ?  HX AORTIC VALVE REPLACEMENT              Bovine bioprosthetic          ?  HX PACEMAKER            Social Hx:  reports that she quit smoking about 11 years ago. Her smoking use included cigarettes. She has a 40.00 pack-year smoking history. She has never used smokeless tobacco. She reports that she does not currently  use alcohol. She reports that she does not use drugs.   Family Hx: family history includes Hypertension in her father and mother.     Allergies        Allergen  Reactions         ?  Nitroglycerin  Unknown (comments)             hypotension         ?  Aloe Vera  Rash     ?  Hydrochlorothiazide  Other (comments)             Reports 'kidneys dry up"          ?  Tetanus And Diphther. Tox (Pf)  Swelling             Swelling of arm and it turns black              OBJECTIVE:     Wt Readings from Last 3 Encounters:        05/23/21  154.5 kg (340 lb 11.2 oz)     04/24/21  (!) 160.8 kg (354 lb 8 oz)        03/09/21  149.7 kg (330 lb)           Intake/Output Summary (Last 24 hours) at 05/23/2021 0914   Last data filed at 05/23/2021 0651     Gross per 24 hour        Intake  240 ml        Output  2450 ml        Net  -2210 ml              Physical Exam:      Vitals:      Vitals:             05/23/21 0801  05/23/21 0811  05/23/21 0831  05/23/21 0841           BP:  (!) 103/48  (!) 103/45  (!) 111/58  (!) 94/58     Pulse:  87  87  (!) 112  74     Resp:  Temp:             SpO2:  97%  96%  98%  98%     Weight:                   Height:                Telemetry:  SR, paced w/ frequent ectopy/PVCs      Gen: Morbidly obese, in no distress    Neck: Supple, No JVD, No Carotid Bruit   Resp: No accessory muscle use, diminished breath sounds, B/L Basal rales are present.    Card: Regular Rate,Rythm, Normal S1, S2, No murmurs, rubs or gallop.    Abd:   obese, Soft, non-tender, non-distended, BS+    MSK: No cyanosis   Skin: No rashes     Neuro: Moving all four extremities, follows commands appropriately   Psych: Fair insight, oriented to person, place, alert, Nml Affect   LE: 3+ edema      Data Review:       Radiology:    XR Results (most recent):   Results from Hospital Encounter encounter on 05/09/21      XR CHEST PORT      Narrative   EXAM:  XR CHEST PORT      INDICATION: Evaluate bilateral infiltrates      COMPARISON: 05/17/2021      TECHNIQUE: Upright portable chest AP view      FINDINGS: Median sternotomy changes and left subclavian pacemaker with cardiac   monitoring leads. Cardiomegaly. Pulmonary edema unchanged  The lungs and pleural spaces are clear. The visualized bones and upper abdomen   are age-appropriate.      Impression   Pulmonary edema unchanged.           Recent Labs            05/23/21   0338  05/22/21   0145     NA  139  137     K  3.8  4.1     CL  106  101     CO2  33*  36*     BUN  26*  35*     CREA  1.08*  1.23*     GLU  122*  130*     PHOS  3.3  3.0         CA  9.0  8.9          Recent Labs            05/23/21   0338  05/22/21   0145     WBC  6.9  10.0     HGB  9.0*  9.6*     HCT  30.9*  32.0*         PLT  90*  114*        No results for input(s): PTP, INR, AP, INREXT, INREXT in the last 72 hours.      No lab exists for component: PTTP, GPT, SGOT      No results for input(s): CHOL, LDLC in the last 72 hours.      No lab exists for component: TGL, HDLC,  HBA1C         Current meds:      Current Facility-Administered Medications:    ?  midodrine (PROAMATINE) tablet 10 mg, 10 mg, Oral, TID WITH MEALS, Fisher, Georga Hacking, NP, 10 mg at 05/23/21 4403   ?   amiodarone (CORDARONE) tablet 400 mg, 400 mg, Oral, BID, Fisher, Georga Hacking, NP, 400 mg at 05/23/21 0820   ?  bumetanide (BUMEX) tablet 1 mg, 1 mg, Oral, DAILY, Laddie Aquas, MD, 1 mg at 05/23/21 4742   ?  meropenem (MERREM) 1 g in 0.9% sodium chloride (MBP/ADV) 50 mL MBP, 1 g, IntraVENous, Q8H, [name], MD, Last Rate: 16.7 mL/hr at 05/23/21 0329, 1 g at 05/23/21 0329   ?  L.acidophilus-paracasei-S.thermophil-bifidobacter (RISAQUAD) 8 billion cell capsule, 1 Capsule, Oral, DAILY, Do, Khoi B, MD, 1 Capsule at 05/23/21 0820   ?  PHENYLephrine (NEO-SYNEPHRINE) 50 mg in 0.9% sodium chloride 250 mL (Vial2Bag), 10-100 mcg/min, IntraVENous, TITRATE, Edwards, Sabrina C, NP, Last Rate: 10.5 mL/hr at 05/23/21 0819, 35 mcg/min at 05/23/21 0819   ?  albuterol-ipratropium (DUO-NEB) 2.5 MG-0.5 MG/3 ML, 3 mL, Nebulization, Q6H RT, Bammes, Nathan O, DO, 3 mL at 05/23/21 0719   ?  pantoprazole (PROTONIX) tablet 40 mg, 40 mg, Oral, ACB, Choudhary, Bhavana, MD, 40 mg at 05/23/21 0636   ?  heparin (porcine) injection 5,000 Units, 5,000 Units, SubCUTAneous, Q8H, Tawanna Sat, MD, 5,000 Units at 05/23/21 0636   ?  [Held by provider] metoprolol succinate (TOPROL-XL) XL tablet 25 mg, 25 mg, Oral, DAILY, Fisher, Georga Hacking, NP, 25 mg at 05/16/21 0847   ?  acetaZOLAMIDE (DIAMOX) tablet 250 mg, 250 mg, Oral, BID, Melynda Keller, MD, 250 mg at 05/23/21 5956   ?  nystatin (MYCOSTATIN) 100,000 unit/gram cream, , Topical, BID, Luella Cook, NP, Given at 05/23/21 3875   ?  traZODone (DESYREL) tablet 100 mg, 100 mg, Oral, QHS, Horald Chestnut, DO, 100 mg at 05/22/21 2207   ?  oxyCODONE IR (ROXICODONE) tablet 5 mg, 5 mg, Oral, Q6H PRN, Horald Chestnut, DO, 5 mg at 05/21/21 2238   ?  albuterol-ipratropium (DUO-NEB) 2.5 MG-0.5 MG/3 ML, 3 mL, Nebulization, Q4H PRN, Horald Chestnut, DO, 3 mL at 05/15/21 2003   ?  insulin lispro (HUMALOG) injection, , SubCUTAneous, AC&HS, Horald Chestnut, DO, 2 Units at 05/23/21 0820   ?  glucose chewable tablet 16 g,  4 Tablet, Oral, PRN, Horald Chestnut, DO   ?  glucagon (GLUCAGEN) injection 1 mg, 1 mg, IntraMUSCular, PRN, Jamil, Arna Medici, DO   ?  dextrose 10% infusion 0-250 mL, 0-250 mL, IntraVENous, PRN, Horald Chestnut, DO   ?  [Held by provider] arformoteroL (BROVANA) neb solution 15 mcg, 15 mcg, Nebulization, BID RT, Jamil, Arna Medici, DO, 15 mcg at 05/17/21 0745   ?  budesonide (PULMICORT) 500 mcg/2 ml nebulizer suspension, 500 mcg, Nebulization, BID RT, Horald Chestnut, DO, 500 mcg at 05/23/21 1610   ?  sodium chloride (NS) flush 5-40 mL, 5-40 mL, IntraVENous, Q8H, Melynda Keller, MD, 10 mL at 05/22/21 2208   ?  sodium chloride (NS) flush 5-40 mL, 5-40 mL, IntraVENous, PRN, Melynda Keller, MD   ?  acetaminophen (TYLENOL) tablet 650 mg, 650 mg, Oral, Q6H PRN, 650 mg at 05/20/21 2157 **OR** acetaminophen (TYLENOL) suppository 650 mg, 650 mg, Rectal, Q6H PRN, Melynda Keller, MD   ?  polyethylene glycol (MIRALAX) packet 17 g, 17 g, Oral, DAILY PRN, Melynda Keller, MD   ?  senna (SENOKOT) tablet 8.6 mg, 1 Tablet, Oral, DAILY PRN, Melynda Keller, MD, 8.6 mg at 05/19/21 1700   ?  promethazine (PHENERGAN) tablet 12.5 mg, 12.5 mg, Oral, Q6H PRN **OR** ondansetron (ZOFRAN) injection 4 mg, 4 mg, IntraVENous, Q6H PRN, Melynda Keller, MD, 4 mg at 05/17/21 0956   ?  aspirin chewable tablet 81 mg, 81 mg, Oral, DAILY, Melynda Keller, MD, 81 mg at 05/23/21 9604   ?  atorvastatin (LIPITOR) tablet 10 mg, 10 mg, Oral, DAILY, Melynda Keller, MD, 10 mg at 05/23/21 0820   ?  citalopram (CELEXA) tablet 40 mg, 40 mg, Oral, DAILY, Melynda Keller, MD, 40 mg at 05/23/21 0820   ?  [Held by provider] glipiZIDE (GLUCOTROL) tablet 5 mg, 5 mg, Oral, BID WITH MEALS, Melynda Keller, MD, 5 mg at 05/17/21 0805   ?  levothyroxine (SYNTHROID) tablet 150 mcg, 150 mcg, Oral, ACB, Melynda Keller, MD, 150 mcg at 05/23/21 0636   ?  montelukast (SINGULAIR) tablet 10 mg, 10 mg, Oral, QHS, Melynda Keller, MD, 10 mg at 05/22/21 2207   ?  sucralfate (CARAFATE) tablet  1 g, 1 g, Oral, TID, Melynda Keller, MD, 1 g at 05/23/21 0820      Sharla Kidney, NP      [facility] Card[facility]   Call center: (P) [phone]  (F) [phone]         CC:Barrett, Lance Sell, NP

## 2021-05-23 NOTE — Progress Notes (Signed)
1900  Bedside and Verbal shift change report given to Leotis Shames, RN (oncoming nurse) by Elmarie Shiley, RN (offgoing nurse). Report included the following information SBAR, Kardex, Intake/Output, MAR, Recent Results, and Cardiac Rhythm apaced .      Central line Type: PICC single lumen  Central Line Insert Date: 04/13/21  Reason Central Line Placed: long term antibiotics  Central Line Dressing Date: 05/22/21  Biopatch in place? Yes No: yes  Tubing labeled and appropriate? Yes No: yes  Alcohol caps on all open ports? Yes No: yes  Last CHG bath (time&date): patient refusing (last 4-25)  Reviewed with provider and central line must stay in for the following reasons: antibiotics    This patient was assisted with Intentional Toileting every 2 hours during this shift as appropriate.  Documentation of ambulation and output reflected on Flowsheet as appropriate.  Purposeful hourly rounding was completed using AIDET and 5Ps.  Outcomes of PHR documented as they occurred. Bed alarm in use as appropriate.  Dual Suction and ambubag in place.     0700  Bedside and Verbal shift change report given to Darryl Nestle., RN (oncoming nurse) by Lenna Sciara., RN (offgoing nurse). Report included the following information SBAR, Kardex, Intake/Output, MAR, Recent Results, and Cardiac Rhythm apaced .

## 2021-05-24 LAB — CBC W/O DIFF
ABSOLUTE NRBC: 0 10*3/uL (ref 0.00–0.01)
HCT: 31.2 % — ABNORMAL LOW (ref 35.0–47.0)
HGB: 9.2 g/dL — ABNORMAL LOW (ref 11.5–16.0)
MCH: 27.5 PG (ref 26.0–34.0)
MCHC: 29.5 g/dL — ABNORMAL LOW (ref 30.0–36.5)
MCV: 93.1 FL (ref 80.0–99.0)
MPV: 11.8 FL (ref 8.9–12.9)
NRBC: 0 PER 100 WBC
PLATELET: 110 10*3/uL — ABNORMAL LOW (ref 150–400)
RBC: 3.35 M/uL — ABNORMAL LOW (ref 3.80–5.20)
RDW: 16.6 % — ABNORMAL HIGH (ref 11.5–14.5)
WBC: 7.8 10*3/uL (ref 3.6–11.0)

## 2021-05-24 LAB — CORTISOL
Cortisol, random: 6.4 ug/dL
Cortisol, random: 17.1 ug/dL
Cortisol, random: 18.4 ug/dL
Cortisol: 6.4 ug/dL
Cortisol: 17.1 ug/dL
Cortisol: 18.4 ug/dL

## 2021-05-24 LAB — GLUCOSE, POC
Glucose (POC): 206 mg/dL — ABNORMAL HIGH (ref 65–117)
Glucose (POC): 131 mg/dL — ABNORMAL HIGH (ref 65–117)
Glucose (POC): 183 mg/dL — ABNORMAL HIGH (ref 65–117)
Glucose (POC): 149 mg/dL — ABNORMAL HIGH (ref 65–117)

## 2021-05-24 LAB — METABOLIC PANEL, BASIC
Anion gap: NEGATIVE mmol/L (ref 5–15)
BUN/Creatinine ratio: 20 (ref 12–20)
BUN: 23 MG/DL — ABNORMAL HIGH (ref 6–20)
CO2: 33 mmol/L — ABNORMAL HIGH (ref 21–32)
Calcium: 9.2 MG/DL (ref 8.5–10.1)
Chloride: 107 mmol/L (ref 97–108)
Creatinine: 1.13 MG/DL — ABNORMAL HIGH (ref 0.55–1.02)
Glucose: 117 mg/dL — ABNORMAL HIGH (ref 65–100)
Potassium: 4.2 mmol/L (ref 3.5–5.1)
Sodium: 139 mmol/L (ref 136–145)
eGFR: 54 mL/min/{1.73_m2} — ABNORMAL LOW (ref >60)

## 2021-05-24 LAB — PHOSPHORUS
Phosphorus: 3.2 MG/DL (ref 2.6–4.7)
Phosphorus: 3.2 MG/DL (ref 2.6–4.7)

## 2021-05-24 LAB — MAGNESIUM
Magnesium: 2.2 mg/dL (ref 1.6–2.4)
Magnesium: 2.2 mg/dL (ref 1.6–2.4)

## 2021-05-24 LAB — NT-PRO BNP: NT pro-BNP: 2206 PG/ML — ABNORMAL HIGH (ref <125)

## 2021-05-24 LAB — BASIC METABOLIC PANEL
Anion Gap: NEGATIVE mmol/L (ref 5–15)
BUN: 23 MG/DL — ABNORMAL HIGH (ref 6–20)
Bun/Cre Ratio: 20 (ref 12–20)
CO2: 33 mmol/L — ABNORMAL HIGH (ref 21–32)
Calcium: 9.2 MG/DL (ref 8.5–10.1)
Chloride: 107 mmol/L (ref 97–108)
Creatinine: 1.13 MG/DL — ABNORMAL HIGH (ref 0.55–1.02)
ESTIMATED GLOMERULAR FILTRATION RATE: 54 mL/min/{1.73_m2} — ABNORMAL LOW (ref >60)
Glucose: 117 mg/dL — ABNORMAL HIGH (ref 65–100)
Potassium: 4.2 mmol/L (ref 3.5–5.1)
Sodium: 139 mmol/L (ref 136–145)

## 2021-05-24 LAB — POCT GLUCOSE
POC Glucose: 206 mg/dL — ABNORMAL HIGH (ref 65–117)
POC Glucose: 131 mg/dL — ABNORMAL HIGH (ref 65–117)
POC Glucose: 183 mg/dL — ABNORMAL HIGH (ref 65–117)
POC Glucose: 149 mg/dL — ABNORMAL HIGH (ref 65–117)

## 2021-05-24 LAB — CBC
Hematocrit: 31.2 % — ABNORMAL LOW (ref 35.0–47.0)
Hemoglobin: 9.2 g/dL — ABNORMAL LOW (ref 11.5–16.0)
MCH: 27.5 PG (ref 26.0–34.0)
MCHC: 29.5 g/dL — ABNORMAL LOW (ref 30.0–36.5)
MCV: 93.1 FL (ref 80.0–99.0)
MPV: 11.8 FL (ref 8.9–12.9)
NRBC Absolute: 0 10*3/uL (ref 0.00–0.01)
Nucleated RBCs: 0 PER 100 WBC
Platelets: 110 10*3/uL — ABNORMAL LOW (ref 150–400)
RBC: 3.35 M/uL — ABNORMAL LOW (ref 3.80–5.20)
RDW: 16.6 % — ABNORMAL HIGH (ref 11.5–14.5)
WBC: 7.8 10*3/uL (ref 3.6–11.0)

## 2021-05-24 LAB — PROBNP, N-TERMINAL: BNP: 2206 PG/ML — ABNORMAL HIGH (ref <125)

## 2021-05-24 MED ORDER — HYDROCORTISONE 10 MG TAB
10 mg | Freq: Two times a day (BID) | ORAL | Status: AC
Start: 2021-05-24 — End: 2021-05-29
  Administered 2021-05-24 – 2021-05-28 (×10): via ORAL

## 2021-05-24 MED FILL — RISAQUAD 8 BILLION CELL CAPSULE: 8 billion cell | ORAL | Qty: 1

## 2021-05-24 MED FILL — ATORVASTATIN 10 MG TAB: 10 mg | ORAL | Qty: 1

## 2021-05-24 MED FILL — BUDESONIDE 0.5 MG/2 ML NEB SUSPENSION: 0.5 mg/2 mL | RESPIRATORY_TRACT | Qty: 1

## 2021-05-24 MED FILL — AMIODARONE 200 MG TAB: 200 mg | ORAL | Qty: 2

## 2021-05-24 MED FILL — MIDODRINE 5 MG TAB: 5 mg | ORAL | Qty: 2

## 2021-05-24 MED FILL — MEROPENEM 1 GRAM IV SOLR: 1 gram | INTRAVENOUS | Qty: 1

## 2021-05-24 MED FILL — HYDROCORTISONE 10 MG TAB: 10 mg | ORAL | Qty: 2

## 2021-05-24 MED FILL — IPRATROPIUM-ALBUTEROL 2.5 MG-0.5 MG/3 ML NEB SOLUTION: 2.5 mg-0.5 mg/3 ml | RESPIRATORY_TRACT | Qty: 3

## 2021-05-24 MED FILL — HEPARIN (PORCINE) 5,000 UNIT/ML IJ SOLN: 5000 unit/mL | INTRAMUSCULAR | Qty: 1

## 2021-05-24 MED FILL — LEVOTHYROXINE 150 MCG TAB: 150 mcg | ORAL | Qty: 1

## 2021-05-24 MED FILL — ASPIRIN 81 MG CHEWABLE TAB: 81 mg | ORAL | Qty: 1

## 2021-05-24 MED FILL — CITALOPRAM 20 MG TAB: 20 mg | ORAL | Qty: 2

## 2021-05-24 MED FILL — INSULIN LISPRO 100 UNIT/ML INJECTION: 100 unit/mL | SUBCUTANEOUS | Qty: 2

## 2021-05-24 MED FILL — CARAFATE 1 GRAM TABLET: 1 gram | ORAL | Qty: 1

## 2021-05-24 MED FILL — MONTELUKAST 10 MG TAB: 10 mg | ORAL | Qty: 1

## 2021-05-24 MED FILL — PHENYLEPHRINE 10 MG/ML INJECTION: 10 mg/mL | INTRAMUSCULAR | Qty: 5

## 2021-05-24 MED FILL — PANTOPRAZOLE 40 MG TAB, DELAYED RELEASE: 40 mg | ORAL | Qty: 1

## 2021-05-24 MED FILL — TRAZODONE 50 MG TAB: 50 mg | ORAL | Qty: 2

## 2021-05-24 NOTE — Progress Notes (Signed)
Progress  Notes by Laddie Aquas, MD at 05/24/21 [phone]                Author: Laddie Aquas, MD  Service: Cardiology  Author Type: Physician       Filed: 05/24/21 1346  Date of Service: 05/24/21 0853  Status: Addendum          Editor: Laddie Aquas, MD (Physician)          Related Notes: Original Note by Sharla Kidney, NP (Nurse Practitioner) filed at 05/24/21 [phone]                                                                                     [facility]                     Cardiology Care Note       Initial Encounter      Follow-up        Patient Name: Deborah [name] - DOB:March 08, 1955 - YQM:[id number]   Primary Cardiologist: [facility] Physicians: Thurston Pounds MD   Consulting Cardiologist: [facility] Physicians: Dr Welton Flakes         Reason for encounter: CHF       HPI:         Deborah [name] is a 66 y.o. female with PMH of HF p EF, a fib, possible atrial lead vegetation treated abx, repeat ECHO with no evidence of vegetation with COPD, A/C resp failure who was recently discharged from hospital on  04/24/2021 to [facility] for rehab.       She says that she had not been receiving her Bumex on a consistent basis and missed several days dosing.       She started having a progressively worsening SOB associated with lower extremity swelling. She says she was using oxygen but required supplementation in the ED. She denies any cough or fever. No chills, nausea or vomiting.      Subjective:        Deborah [name] reports breathing is ok. Remains on neo          Assessment and Plan          1 A/C HF p EF: seems as if her diuretics were not being administered at SNF. Holding diuretics  d/t BP. Repeat TTE w/ EF 60-65%      2 Hx a fib s/p PPM (not MRI compatible)/ s/p AVR: Has had bigeminy and intermittent episodes of SVT with Pacer tracking at 150 bpm - started  on PO amio, ectopy improved. BB held due to hypotension . Not previously anticoagulated (thrombocytopenia).  No significant AV  stenosis on repeat TTE      3 Bacteremia due to group B Streptococcus: during recent admission, she had GBS bacteremia complicated by pacemaker line vegetation. Continue IV Ceftriaxone scheduled to run through  05/23/21.       4 Acute on chronic respiratory failure with hypoxia and hypercapnia/COPD: On NC        5 Hypotension: increased midodrine, unclear cause, normal EF on TTE. Agree w/ holding bumex/diamox. Hydrocortisone started per hospitalist. Cont to wean neo  to  MAP > 60        6 T 2 DM       7 CKD : stable       8 Obesity: Body mass index is 53.24 kg/m.       9 Thrombocytopenia              CARDIOLOGY ATTENDING   Patient personally seen and examined. All the elements of history and examination were personally performed. Assessment and plan was discussed and agree.         Does feel a littler better overall    Obese, NAD, Hrt RRR, 3/6 systolic murmur RSB, + obese, + LE edema       Tele - atrial paced with lots of PVC           1) Acute on chronic HFpEF   - p/w SOB, volume overload 04/08/21    - Echo 05/22/21 - LVEF 60%   - Trying to diurese but due to hypotension, having to hold diuretics. (Bumex and diamox)   ---> vitals during PT On 05/24/21 was stable           2) s/p AVR  - Echo 04/17/21 -  Aortic Valve: Not well visualized. Edwards bovine bioprosthetic valve with a size of 25 mm. Mild regurgitation. Stenosis of the aortic valve. AV mean gradient is 38 mmHg. AV area by continuity VTI is 1.0 cm2   - Echo 05/22/21 - shows some AVR stenosis, but likely not significant        3) History of Afib s/p PPM    - BB held due to hypotension .    - Not previously anticoagulated (thrombocytopenia).         4) Bacteremia due to group B Streptococcus: during recent admission, she had GBS bacteremia complicated by pacemaker line vegetation. Continue IV Ceftriaxone scheduled to run through 05/23/21.       5) Acute on chronic respiratory failure with hypoxia and hypercapnia/COPD:        6) Hypotension   - Echo 05/22/21 with LVEF  60-65%   - Trying midodrine and hydrocortisone  to decrease use of pressors        7) Frequent PVC's    - has had this for a while    - Holter 08/2020 > 30 thousands PVC, 10 beat NSVT, brief PAT   - cardiolite OK 1/23    ---> started amiodarone load (unable to take BB due to hypotension)        Deborah Aquas, MD, [facility]           [x]      High complexity decision making was performed   [x]      Patient is at high-risk of decompensation with multiple organ involvement                       ____________________________________________________________      Cardiac testing   05/09/21      ECHO ADULT COMPLETE 05/22/2021 05/22/2021      Interpretation Summary   ?  Contrast used: Definity.   ?  Technical qualifiers: Echo study was technically difficult with poor endocardial visualization, limited due to patient's condition, limited  due to patient tolerance and technically difficult due to patient's heart rhythm.   ?  Left Ventricle: Normal left ventricular systolic function with a visually estimated EF of 60 - 65%. Left ventricle size is normal. Mildly  increased wall thickness. Unable to assess wall  motion.   ?  Aortic Valve: Not well visualized. Bioprosthetic valve. AV mean gradient is 34 mmHg. LVOT:AV VTI Index is 0.38.  With a mean PG 34 mmHg,  DVI 0.38 and an intermediate jet counter, there is possibly some valve stenosis but likely not significant.      Signed by: Laddie AquasShakil Seleste Tallman, MD on 05/22/2021 11:13 PM         04/08/21      ECHO ADULT FOLLOW-UP OR LIMITED 04/17/2021 04/17/2021      Interpretation Summary   ?  Aortic Valve: Not well visualized. Edwards bovine bioprosthetic valve with a size of 25 mm. Mild regurgitation.  Stenosis of the aortic valve. AV mean gradient is 38 mmHg. AV area by continuity VTI is 1.0 cm2.   ?  Technical qualifiers: Echo study was technically difficult due to patient's body habitus.      Signed by: Candyce ChurnJohn T Owens, DO on 04/17/2021  5:00 PM         02/16/21      NUCLEAR CARDIAC STRESS TEST 02/20/2021  02/26/2021      Interpretation Summary   ?  Nuclear Findings: LV perfusion is normal. There is no evidence of inducible ischemia.   ?  Nuclear Findings: The defect appears to be caused by breast attenuation.   ?  ECG: Resting ECG demonstrates normal sinus rhythm.   ?  ECG: Stress ECG was negative for ischemia.   ?  Stress Test: A pharmacological stress test was performed using lexiscan. Hemodynamics are adequate for diagnosis. Blood pressure demonstrated  a normal response and heart rate demonstrated a normal response to stress. The patient's heart rate recovery was normal. The patient reported dyspnea and no chest pain during the stress test.      Ventricular bigeminy   Unifocal PVC, RBBB and superior axis PVCs      Signed by: Thurston PoundsMatthew Ngo, MD on 02/20/2021  5:00 PM      Most recent HS troponins:   No results for input(s): TROPHS in the last 72 hours.      No lab exists for component:  CKMB            ECG:      EKG Results                  Procedure  720  Value  Units  Date/Time           EKG, 12 LEAD, INITIAL [[id number]][[id number]]  Collected: 05/18/21 1916       Order Status: Completed  Updated: 05/19/21 1310                Ventricular Rate  123  BPM           Atrial Rate  57  BPM           QRS Duration  166  ms           Q-T Interval  392  ms           QTC Calculation (Bezet)  561  ms           Calculated R Axis  -73  degrees           Calculated T Axis  108  degrees                Diagnosis  --             Ventricular-paced rhythm   Abnormal ECG   When compared  with ECG of 18-May-2021 01:25,   Vent. rate has increased BY   5 BPM   Confirmed by Barry Dienes, M.D., Jonny Ruiz ([id number]) on 05/19/2021 1:10:36 PM              EKG, 12 LEAD, INITIAL [[id number]]  Collected: 05/18/21 0125       Order Status: Completed  Updated: 05/18/21 1556                Ventricular Rate  118  BPM           Atrial Rate  37  BPM           QRS Duration  172  ms           Q-T Interval  406  ms           QTC Calculation (Bezet)  569  ms           Calculated R Axis   -70  degrees           Calculated T Axis  104  degrees                Diagnosis  --             Ventricular-paced rhythm   Abnormal ECG   Confirmed by Barry Dienes, M.D., John ([id number]) on 05/18/2021 3:56:31 PM              EKG, 12 LEAD, INITIAL [[id number]]  Collected: 05/17/21 2134       Order Status: Completed  Updated: 05/18/21 1553                Ventricular Rate  107  BPM           Atrial Rate  107  BPM           P-R Interval  232  ms           QRS Duration  170  ms           Q-T Interval  362  ms           QTC Calculation (Bezet)  483  ms           Calculated R Axis  -71  degrees           Calculated T Axis  110  degrees                Diagnosis  --             Atrial-sensed ventricular-paced rhythm with prolonged AV conduction   Abnormal ECG   When compared with ECG of 11-May-2021 20:56,   Electronic ventricular pacemaker has replaced Electronic atrial pacemaker   Confirmed by Barry Dienes, M.D., John [id number]) on 05/18/2021 3:53:20 PM              EKG, 12 LEAD, INITIAL [[id number]]  Collected: 05/11/21 2056       Order Status: Completed  Updated: 05/14/21 1706                Ventricular Rate  84  BPM           Atrial Rate  84  BPM           P-R Interval  168  ms           QRS Duration  162  ms           Q-T Interval  388  ms  QTC Calculation (Bezet)  458  ms           Calculated P Axis  68  degrees           Calculated R Axis  -102  degrees           Calculated T Axis  11  degrees                Diagnosis  --             Sinus rhythm with frequent atrial-paced complexes and premature    supraventricular complexes in a pattern of bigeminy   Right bundle branch block   Septal infarct , age undetermined   T wave abnormality, consider lateral ischemia   Abnormal ECG      Confirmed by Barry Dienes, M.D., John ([id number]) on 05/14/2021 5:06:24 PM              EKG, 12 LEAD, INITIAL [[id number]]  Collected: 05/11/21 2005       Order Status: Completed  Updated: 05/14/21 1706                Ventricular Rate  116  BPM           Atrial Rate   116  BPM           P-R Interval  200  ms           QRS Duration  190  ms           Q-T Interval  368  ms           QTC Calculation (Bezet)  511  ms           Calculated R Axis  -72  degrees           Calculated T Axis  105  degrees                Diagnosis  --             Atrial-sensed ventricular-paced rhythm   Abnormal ECG   Confirmed by Barry Dienes, M.D., John ([id number]) on 05/14/2021 5:06:15 PM              EKG, 12 LEAD, INITIAL [[id number]]  Collected: 05/11/21 1531       Order Status: Completed  Updated: 05/14/21 1706                Ventricular Rate  83  BPM           Atrial Rate  83  BPM           P-R Interval  160  ms           QRS Duration  160  ms           Q-T Interval  368  ms           QTC Calculation (Bezet)  432  ms           Calculated P Axis  64  degrees           Calculated R Axis  -100  degrees           Calculated T Axis  16  degrees                Diagnosis  --             Sinus rhythm with frequent atrial-paced complexes and premature    supraventricular complexes in a  pattern of bigeminy   Right bundle branch block   Possible Lateral infarct , age undetermined   Abnormal ECG   Confirmed by Barry Dienes, M.D., John ([id number]) on 05/14/2021 5:06:02 PM              EKG 12 LEAD INITIAL [[id number]]  Collected: 05/10/21 0635       Order Status: Completed  Updated: 05/12/21 1304                Ventricular Rate  80  BPM           Atrial Rate  60  BPM           P-R Interval  212  ms           QRS Duration  112  ms           Q-T Interval  526  ms           QTC Calculation (Bezet)  606  ms           Calculated P Axis  61  degrees           Calculated R Axis  -55  degrees                  Calculated T Axis  -158  degrees                Diagnosis  --             Atrial-paced rhythm with prolonged AV conduction with frequent premature    ventricular complexes   Incomplete right bundle branch block   Left anterior fascicular block   Septal infarct , age undetermined   T wave abnormality, consider lateral ischemia   Prolonged QT    Abnormal ECG   When compared with ECG of 09-May-2021 20:16,   No significant change      Confirmed by Rathi MD, Vikas ([id number]) on 05/12/2021 1:04:16 PM              EKG, 12 LEAD, INITIAL [[id number]]         Order Status: Sent         EKG 12 LEAD INITIAL [[id number]]  Collected: 05/09/21 2016       Order Status: Completed  Updated: 05/11/21 1349                Ventricular Rate  97  BPM           Atrial Rate  49  BPM           P-R Interval  160  ms           QRS Duration  160  ms           Q-T Interval  350  ms           QTC Calculation (Bezet)  444  ms           Calculated P Axis  67  degrees           Calculated R Axis  -117  degrees           Calculated T Axis  6  degrees                Diagnosis  --             Sinus bradycardia with marked sinus arrhythmia with frequent atrial-paced    complexes and with frequent and consecutive premature ventricular  complexes   Right bundle branch block   Possible Lateral infarct , age undetermined   Abnormal ECG   When compared with ECG of 13-Apr-2021 22:14,   premature ventricular complexes are now present   premature atrial complexes are no longer present   Borderline criteria for Lateral infarct are now present   Confirmed by Rathi MD, Vikas [phone]) on 05/11/2021 1:49:45 PM                           Review of Systems:      All other systems reviewed and all negative except as written in HPI       Patient unable to provide secondary  to condition        Past Medical History:        Diagnosis  Date         ?  (HFpEF) heart failure with preserved ejection fraction (HCC)       ?  Anxiety and depression       ?  Aortic valve replaced            S/p bovine aortic valve replacement.         ?  Asthma       ?  Chronic narcotic use       ?  Chronic obstructive pulmonary disease (HCC)       ?  Chronic pain       ?  CKD (chronic kidney disease), stage III (HCC)            Baseline creatinine is 1.3-1.4 with GFR in the 40s.         ?  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  GERD  (gastroesophageal reflux disease)       ?  History of vascular access device  04/13/2021          4 FR Single PICC for LTABX: R cephalic vessell length 48 CM Max P leave @ 1 CM out; Arm circumferenc 40 CM         ?  Hyperlipidemia       ?  Hypothyroidism       ?  Morbid obesity (HCC)       ?  Neuropathy       ?  Obstructive sleep apnea           ?  Rhinitis            Past Surgical History:         Procedure  Laterality  Date          ?  COLONOSCOPY  N/A  12/01/2020          COLONOSCOPY performed by Ian Malkin, MD at [facility] ENDOSCOPY          ?  HX AORTIC VALVE REPLACEMENT              Bovine bioprosthetic          ?  HX PACEMAKER            Social Hx:  reports that she quit smoking about 11 years ago. Her smoking use included cigarettes. She has a 40.00 pack-year smoking history. She has never used smokeless tobacco. She reports that she does not currently  use alcohol. She reports that she does not use drugs.   Family Hx: family history includes Hypertension in her father and mother.  Allergies        Allergen  Reactions         ?  Nitroglycerin  Unknown (comments)             hypotension         ?  Aloe Vera  Rash     ?  Hydrochlorothiazide  Other (comments)             Reports 'kidneys dry up"          ?  Tetanus And Diphther. Tox (Pf)  Swelling             Swelling of arm and it turns black              OBJECTIVE:     Wt Readings from Last 3 Encounters:        05/24/21  154.2 kg (339 lb 14.4 oz)     04/24/21  (!) 160.8 kg (354 lb 8 oz)        03/09/21  149.7 kg (330 lb)           Intake/Output Summary (Last 24 hours) at 05/24/2021 0855   Last data filed at 05/24/2021 0435     Gross per 24 hour        Intake  240 ml        Output  400 ml        Net  -160 ml              Physical Exam:      Vitals:      Vitals:             05/24/21 0751  05/24/21 0801  05/24/21 0816  05/24/21 0832           BP:    (!) 106/53    106/60     Pulse:    60    63     Resp:    13    13     Temp:             SpO2:  93%  100%  100%  99%      Weight:                   Height:                Telemetry: SR, paced       Gen: Morbidly obese, in no distress    Neck: Supple, No JVD, No Carotid Bruit   Resp: No accessory muscle use, diminished breath sounds   Card: Regular Rate,Rythm, Normal S1, S2, No murmurs, rubs or gallop.    Abd:   obese, Soft, non-tender, non-distended, BS+    MSK: No cyanosis   Skin: No rashes     Neuro: Moving all four extremities, follows commands appropriately   Psych: Fair insight, oriented to person, place, alert, Nml Affect   LE: 3+ edema      Data Review:       Radiology:    XR Results (most recent):   Results from Hospital Encounter encounter on 05/09/21      XR CHEST PORT      Narrative   EXAM:  XR CHEST PORT      INDICATION: Evaluate bilateral infiltrates      COMPARISON: 05/17/2021      TECHNIQUE: Upright portable chest AP view      FINDINGS: Median sternotomy changes and left subclavian pacemaker with  cardiac   monitoring leads. Cardiomegaly. Pulmonary edema unchanged      The lungs and pleural spaces are clear. The visualized bones and upper abdomen   are age-appropriate.      Impression   Pulmonary edema unchanged.           Recent Labs            05/24/21   0312  05/23/21   0338     NA  139  139     K  4.2  3.8     CL  107  106     CO2  33*  33*     BUN  23*  26*     CREA  1.13*  1.08*     GLU  117*  122*     PHOS  3.2  3.3         CA  9.2  9.0          Recent Labs            05/24/21   0312  05/23/21   0338     WBC  7.8  6.9     HGB  9.2*  9.0*     HCT  31.2*  30.9*         PLT  110*  90*        No results for input(s): PTP, INR, AP, INREXT, INREXT in the last 72 hours.      No lab exists for component: PTTP, GPT, SGOT      No results for input(s): CHOL, LDLC in the last 72 hours.      No lab exists for component: TGL, HDLC,  HBA1C         Current meds:      Current Facility-Administered Medications:    ?  hydrocortisone (CORTEF) tablet 20 mg, 20 mg, Oral, BID WITH MEALS, Do, Khoi B, MD   ?  PHENYLephrine  (NEO-SYNEPHRINE) 50 mg in 0.9% sodium chloride 250 mL (Vial2Bag), 10-100 mcg/min, IntraVENous, TITRATE, Heath, Stephanie A, NP, Last Rate: 7.5 mL/hr at 05/24/21 0836, 25 mcg/min at 05/24/21 0836   ?  midodrine (PROAMATINE) tablet 10 mg, 10 mg, Oral, TID WITH MEALS, Fisher, Georga Hacking, NP, 10 mg at 05/23/21 1656   ?  amiodarone (CORDARONE) tablet 400 mg, 400 mg, Oral, BID, Sherrie Mustache Georga Hacking, NP, 400 mg at 05/23/21 1736   ?  [Held by provider] bumetanide (BUMEX) tablet 1 mg, 1 mg, Oral, DAILY, Laddie Aquas, MD, 1 mg at 05/23/21 0819   ?  meropenem (MERREM) 1 g in 0.9% sodium chloride (MBP/ADV) 50 mL MBP, 1 g, IntraVENous, Q8H, [name], MD, Last Rate: 16.7 mL/hr at 05/24/21 0307, 1 g at 05/24/21 0307   ?  L.acidophilus-paracasei-S.thermophil-bifidobacter (RISAQUAD) 8 billion cell capsule, 1 Capsule, Oral, DAILY, Do, Khoi B, MD, 1 Capsule at 05/23/21 0820   ?  albuterol-ipratropium (DUO-NEB) 2.5 MG-0.5 MG/3 ML, 3 mL, Nebulization, Q6H RT, Bammes, Nathan O, DO, 3 mL at 05/24/21 0740   ?  pantoprazole (PROTONIX) tablet 40 mg, 40 mg, Oral, ACB, Choudhary, Bhavana, MD, 40 mg at 05/24/21 1610   ?  heparin (porcine) injection 5,000 Units, 5,000 Units, SubCUTAneous, Q8H, Tawanna Sat, MD, 5,000 Units at 05/24/21 0626   ?  [Held by provider] metoprolol succinate (TOPROL-XL) XL tablet 25 mg, 25 mg, Oral, DAILY, Fisher, Georga Hacking, NP, 25 mg at 05/16/21 0847   ?  [Held by provider] acetaZOLAMIDE (DIAMOX) tablet  250 mg, 250 mg, Oral, BID, Melynda Keller, MD, 250 mg at 05/23/21 1610   ?  nystatin (MYCOSTATIN) 100,000 unit/gram cream, , Topical, BID, Marca Ancona C, NP, Given at 05/23/21 1700   ?  traZODone (DESYREL) tablet 100 mg, 100 mg, Oral, QHS, Horald Chestnut, DO, 100 mg at 05/23/21 2205   ?  oxyCODONE IR (ROXICODONE) tablet 5 mg, 5 mg, Oral, Q6H PRN, Horald Chestnut, DO, 5 mg at 05/23/21 1945   ?  albuterol-ipratropium (DUO-NEB) 2.5 MG-0.5 MG/3 ML, 3 mL, Nebulization, Q4H PRN, Horald Chestnut, DO, 3 mL at 05/15/21 2003   ?   insulin lispro (HUMALOG) injection, , SubCUTAneous, AC&HS, Horald Chestnut, DO, 2 Units at 05/23/21 2206   ?  glucose chewable tablet 16 g, 4 Tablet, Oral, PRN, Horald Chestnut, DO   ?  glucagon (GLUCAGEN) injection 1 mg, 1 mg, IntraMUSCular, PRN, Jamil, Arna Medici, DO   ?  dextrose 10% infusion 0-250 mL, 0-250 mL, IntraVENous, PRN, Horald Chestnut, DO   ?  [Held by provider] arformoteroL (BROVANA) neb solution 15 mcg, 15 mcg, Nebulization, BID RT, Jamil, Arna Medici, DO, 15 mcg at 05/17/21 0745   ?  budesonide (PULMICORT) 500 mcg/2 ml nebulizer suspension, 500 mcg, Nebulization, BID RT, Horald Chestnut, DO, 500 mcg at 05/24/21 0751   ?  sodium chloride (NS) flush 5-40 mL, 5-40 mL, IntraVENous, Q8H, Melynda Keller, MD, 10 mL at 05/23/21 2206   ?  sodium chloride (NS) flush 5-40 mL, 5-40 mL, IntraVENous, PRN, Melynda Keller, MD   ?  acetaminophen (TYLENOL) tablet 650 mg, 650 mg, Oral, Q6H PRN, 650 mg at 05/23/21 1401 **OR** acetaminophen (TYLENOL) suppository 650 mg, 650 mg, Rectal, Q6H PRN, Melynda Keller, MD   ?  polyethylene glycol (MIRALAX) packet 17 g, 17 g, Oral, DAILY PRN, Melynda Keller, MD   ?  senna (SENOKOT) tablet 8.6 mg, 1 Tablet, Oral, DAILY PRN, Melynda Keller, MD, 8.6 mg at 05/19/21 1700   ?  promethazine (PHENERGAN) tablet 12.5 mg, 12.5 mg, Oral, Q6H PRN **OR** ondansetron (ZOFRAN) injection 4 mg, 4 mg, IntraVENous, Q6H PRN, Melynda Keller, MD, 4 mg at 05/17/21 0956   ?  aspirin chewable tablet 81 mg, 81 mg, Oral, DAILY, Melynda Keller, MD, 81 mg at 05/23/21 9604   ?  atorvastatin (LIPITOR) tablet 10 mg, 10 mg, Oral, DAILY, Melynda Keller, MD, 10 mg at 05/23/21 0820   ?  citalopram (CELEXA) tablet 40 mg, 40 mg, Oral, DAILY, Melynda Keller, MD, 40 mg at 05/23/21 0820   ?  [Held by provider] glipiZIDE (GLUCOTROL) tablet 5 mg, 5 mg, Oral, BID WITH MEALS, Melynda Keller, MD, 5 mg at 05/17/21 0805   ?  levothyroxine (SYNTHROID) tablet 150 mcg, 150 mcg, Oral, ACB, Melynda Keller, MD, 150 mcg at 05/24/21 5409   ?   montelukast (SINGULAIR) tablet 10 mg, 10 mg, Oral, QHS, Melynda Keller, MD, 10 mg at 05/23/21 2205   ?  sucralfate (CARAFATE) tablet 1 g, 1 g, Oral, TID, Melynda Keller, MD, 1 g at 05/23/21 2205      Sharla Kidney, NP      [facility]   Call center: (P) [phone]  (F) [phone]         CC:Barrett, Lance Sell, NP

## 2021-05-24 NOTE — Progress Notes (Signed)
Progress Notes by Burton Apley, RN at 05/24/21 1858                Author: Burton Apley, RN  Service: --  Author Type: Registered Nurse       Filed: 05/25/21 0330  Date of Service: 05/24/21 1858  Status: Addendum          Editor: Burton Apley, RN (Registered Nurse)          Related Notes: Original Note by Burton Apley, RN (Registered Nurse) filed at 05/24/21 1858               1900   Bedside and Verbal shift change report given to Lenna Sciara., RN (oncoming nurse) by Darryl Nestle., RN (offgoing nurse). Report included the following information SBAR, Kardex, Intake/Output, MAR, Recent Results,  and Cardiac Rhythm apaced .       Central line Type: PICC single lumen   Central Line Insert Date: 04/13/21   Reason Central Line Placed: long term antibiotics   Central Line Dressing Date: 04/21/21   Biopatch in place? Yes No: yes   Tubing labeled and appropriate? Yes No: yes   Alcohol caps on all open ports? Yes No: yes   Last CHG bath (time&date): 05/24/21 @ 1017   Reviewed with provider and central line must stay in for the following reasons: antibiotics/neo      This patient was assisted with Intentional Toileting every 2 hours during this shift as appropriate.  Documentation of ambulation and output reflected on Flowsheet as appropriate.   Purposeful hourly rounding was completed using AIDET and 5Ps.  Outcomes of PHR documented as they occurred. Bed alarm in use as appropriate.  Dual Suction and ambubag in place.       0700   Bedside and Verbal shift change report given to Mardene Celeste, Charity fundraiser (oncoming nurse) by Leotis Shames, RN (offgoing nurse). Report included the following information SBAR, Kardex, Intake/Output, MAR, Recent Results,  and Cardiac Rhythm apaced .

## 2021-05-24 NOTE — Progress Notes (Signed)
Progress  Notes by Freddy Finner. at 05/24/21 1456                Author: Freddy Finner.  Service: --  Author Type: Care Management       Filed: 05/24/21 1459  Date of Service: 05/24/21 1456  Status: Signed          Editor: Freddy Finner (Care Management)               Transition of Care Plan: RUR -30%, LOS 15 days   1.  Medical management continues   2.  PT/OT following - hh v snf recommended (pt prefers hh)   3.  Remains on IV abx-meropenem through 4/29- ID following   4.  Pulmonology following   5.  Cardiology following   6.  Currently on 1L O2 nc; cpap at night   7.  CM following- patient is a readmission.  She lives with her son and daughter-in-law.  HH has been arranged with [facility].   L.Neely, RN

## 2021-05-24 NOTE — Progress Notes (Signed)
0700: Bedside and Verbal shift change report given to Lauren RN (oncoming nurse) by Leotis Shames RN (offgoing nurse). Report included the following information SBAR, Kardex, Accordion, and Cardiac Rhythm NSR .    This patient was assisted with Intentional Toileting every 2 hours during this shift as appropriate.  Documentation of ambulation and output reflected on Flowsheet as appropriate.  Purposeful hourly rounding was completed using AIDET and 5Ps.  Outcomes of PHR documented as they occurred. Bed alarm in use as appropriate.  Dual Suction and ambubag in place.

## 2021-05-24 NOTE — Progress Notes (Signed)
Progress  Notes by Lezlie Lye at 05/24/21 1602                Author: Lezlie Lye  Service: --  Author Type: Care Management       Filed: 05/24/21 1603  Date of Service: 05/24/21 1602  Status: Signed          Editor: Lezlie Lye (Care Management)               Hospital Follow-up with NP Dierdre Forth on 06/13/2021 at 1:00 pm.   Lezlie Lye CMS

## 2021-05-24 NOTE — Progress Notes (Signed)
Problem: Mobility Impaired (Adult and Pediatric)  Goal: *Acute Goals and Plan of Care (Insert Text)  Description: FUNCTIONAL STATUS PRIOR TO ADMISSION: Patient was modified independent using a single point cane for household and community amb. Recent hospital admission and d/c to SNF [facility] x last 2+ weeks then readmit    HOME SUPPORT PRIOR TO ADMISSION: The patient lived with son's family but did not require assist.    Physical Therapy Goals  Revised 05/19/2021  1.  Patient will move from supine to sit and sit to supine , scoot up and down, and roll side to side in bed with modified independence within 7 day(s).  2.  Patient will transfer from bed to chair and chair to bed with modified independence using the least restrictive device within 7 day(s).  3.  Patient will perform sit to stand with modified independence within 7 day(s).  4.  Patient will ambulate with modified independence for 50 feet with the least restrictive device within 7 day(s).   5.  Patient will ascend/descend 5 stairs with 1 handrail(s) with modified independence within 7 day(s).         Physical Therapy Goals  Initiated 05/12/2021; reviewed 4/21 and remain appropriate as below   1.  Patient will move from supine to sit and sit to supine , scoot up and down, and roll side to side in bed with modified independence within 7 day(s).  NOT MET  2.  Patient will transfer from bed to chair and chair to bed with modified independence using the least restrictive device within 7 day(s). NOT MET  3.  Patient will perform sit to stand with modified independence within 7 day(s). NOT MET  4.  Patient will ambulate with modified independence for 50 feet with the least restrictive device within 7 day(s). NOT MET  5.  Patient will ascend/descend 5 stairs with 1 handrail(s) and SPC with modified independence within 7 day(s). NOT MET  Outcome: Progressing Towards Goal   PHYSICAL THERAPY TREATMENT  Patient: Deborah [name] (66 y.o. female)  Date: 05/24/2021  Diagnosis:  Acute on chronic heart failure with preserved ejection fraction (HFpEF) (HCC) [I50.33] Acute on chronic heart failure with preserved ejection fraction (HFpEF) ([place])      Precautions: Contact, Fall  Chart, physical therapy assessment, plan of care and goals were reviewed.    ASSESSMENT  Patient continues with skilled PT services and is progressing towards goals. Patient remains with low BP but does not complain of any symptoms. Per nursing low BP acceptable as long as MAP 60 or above.  Observed SOB with activity though she was on 1 liter of O2 throughout. May benefit from SNF setting with PT 5 times weekly .     Current Level of Function Impacting Discharge (mobility/balance): Received up in chair on 1 liter and willing to work with PT.  Performed BLE exercises in sitting, then ambulated with RW for 15 feet with min assist, 2nd person to manage IV pole.  She did have BP reading of 111/24 with map of 53 after ambulation but improved to 104/67 after sitting and doing ankle pumps. Notified nursing.   Quick recovery of SOB after ambulation with PLB. O2 sats range from 91-93%. BP documented in flow sheets.      Other factors to consider for discharge: none additional         PLAN :  Patient continues to benefit from skilled intervention to address the above impairments.  Continue treatment per established plan of care.  to address goals.    Recommendation for discharge: (in order for the patient to meet his/her long term goals)  Therapy up to 5 days/week in SNF setting    This discharge recommendation:  Has not yet been discussed the attending provider and/or case management    IF patient discharges home will need the following DME: to be determined (TBD)       SUBJECTIVE:   Patient stated "I have been up for about an hour."    OBJECTIVE DATA SUMMARY:   Critical Behavior:  Neurologic State: Sleeping  Orientation Level: Oriented X4  Cognition: Follows commands  Safety/Judgement: Awareness of environment  Functional  Mobility Training:  Bed Mobility:   Received up in chair and returned to chair              Transfers:  Sit to Stand: Contact guard assistance  Stand to Sit: Contact guard assistance                             Balance:  Sitting: Intact  Standing: Intact;With support  Standing - Static: Good  Standing - Dynamic : Fair  Ambulation/Gait Training:  Distance (ft): 15 Feet (ft)  Assistive Device: Gait belt;Walker, rolling  Ambulation - Level of Assistance: Contact guard assistance        Gait Abnormalities: Decreased step clearance        Base of Support: Widened     Speed/Cadence: Pace decreased (<100 feet/min)  Step Length: Right shortened;Left shortened                                  Therapeutic Exercises:   Ankle pumps, LAQ, hip flexion in sitting  Pain Rating:  Knee pain with ambulation    Activity Tolerance:   Fair, requires frequent rest breaks, and observed SOB with activity    After treatment patient left in no apparent distress:   Sitting in chair    COMMUNICATION/COLLABORATION:   The patient's plan of care was discussed with: Registered nurse.     Vanetta Mulders, PT   Time Calculation: 23 mins

## 2021-05-24 NOTE — Progress Notes (Signed)
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  Infection was thought to be due to left great toe wound. Per cardiology no plans for device removal.  Cardiology recommended course of antibiotics with repeat imaging once antibiotics are complete. In light of prosthetic valve patient was treated with 6-week course of IV ceftriaxone which was scheduled to be completed on 05/23/2021.  Gentamicin not administered due to CKD.  Further antibiotics as outlined below.  Cardiology following at this admission.  Defer further imaging plans to cardiology team  ESBL E. coli UTI.  Continue meropenem through 4/29  Hypotension.  Patient requiring vasopressors. Doubt due to ongoing infection.  Continue meropenem through 4/29 then monitor off antibiotics  Acute hypoxic hypercapnic respiratory failure.  Suspect pulmonary edema.  Pulmonary following   History of bioprosthetic aortic valve replacement and pacemaker.   SVT.  On amiodarone.              Subjective:     No new complaints.  Remains on vasopressors.    Objective:     Vitals: Visit Vitals  BP (!) 127/48   Pulse 85   Temp 99.2 F (37.3 C)   Resp 23   Ht 5\' 7"  (1.702 m)   Wt 339 lb 14.4 oz (154.2 kg)   SpO2 (!) 89%   BMI 53.24 kg/m        Tmax:  Temp (24hrs), Avg:98.4 F (36.9 C), Min:97.6 F (36.4 C), Max:99.2 F (37.3 C)      Exam:   Patient is intubated:  no    Physical Examination:   General:  Alert, cooperative, no distress   Head:  Normocephalic, atraumatic.   Eyes:  Conjunctivae clear   Neck: Supple       Lungs:   No distress.   Chest wall:     Heart:     Abdomen:   Obese, non-distended   Extremities: Moves all.     Skin: No acute rash on exposed skin   Neurologic: CNII-XII intact. Normal strength     Labs:        No lab exists for component: ITNL   No results for input(s): CPK, CKMB,  TROIQ in the last 72 hours.  Recent Labs     05/24/21  0312 05/23/21  0338 05/22/21  0145   NA 139 139 137   K 4.2 3.8 4.1   CL 107 106 101   CO2 33* 33* 36*   BUN 23* 26* 35*   CREA 1.13* 1.08* 1.23*   GLU 117* 122* 130*   PHOS 3.2 3.3 3.0   MG 2.2 2.2 2.2   WBC 7.8 6.9 10.0   HGB 9.2* 9.0* 9.6*   HCT 31.2* 30.9* 32.0*   PLT 110* 90* 114*     No results for input(s): INR, PTP, APTT, INREXT in the last 72 hours.  Needs: urine analysis, urine sodium, protein and creatinine  Lab Results   Component Value Date/Time    Sodium,urine random 81 11/27/2020 07:14 PM    Creatinine, urine random 27.00 11/28/2020 04:49 PM         Cultures:     No results found for: SDES  Lab Results   Component Value Date/Time  Culture result: (A) 05/17/2021 02:58 PM     Escherichia coli ** (EXTENDED SPECTRUM BETA LACTAMASE PRODUCER) **    Culture result: MIXED UROGENITAL FLORA ISOLATED 05/17/2021 02:58 PM    Culture result: NO GROWTH 5 DAYS 05/17/2021 10:12 AM       Radiology:     Medications       Current Facility-Administered Medications   Medication Dose Route Frequency Last Admin    hydrocortisone (CORTEF) tablet 20 mg  20 mg Oral BID WITH MEALS 20 mg at 05/24/21 1024    PHENYLephrine (NEO-SYNEPHRINE) 50 mg in 0.9% sodium chloride 250 mL (Vial2Bag)  10-100 mcg/min IntraVENous TITRATE 30 mcg/min at 05/24/21 1409    midodrine (PROAMATINE) tablet 10 mg  10 mg Oral TID WITH MEALS 10 mg at 05/24/21 1147    amiodarone (CORDARONE) tablet 400 mg  400 mg Oral BID 400 mg at 05/24/21 0856    [Held by provider] bumetanide (BUMEX) tablet 1 mg  1 mg Oral DAILY 1 mg at 05/23/21 0819    meropenem (MERREM) 1 g in 0.9% sodium chloride (MBP/ADV) 50 mL MBP  1 g IntraVENous Q8H 1 g at 05/24/21 1147    L.acidophilus-paracasei-S.thermophil-bifidobacter (RISAQUAD) 8 billion cell capsule  1 Capsule Oral DAILY 1 Capsule at 05/24/21 0856    albuterol-ipratropium (DUO-NEB) 2.5 MG-0.5 MG/3 ML  3 mL Nebulization Q6H RT 3 mL at 05/24/21 1323    pantoprazole (PROTONIX)  tablet 40 mg  40 mg Oral ACB 40 mg at 05/24/21 0627    heparin (porcine) injection 5,000 Units  5,000 Units SubCUTAneous Q8H 5,000 Units at 05/24/21 0626    [Held by provider] metoprolol succinate (TOPROL-XL) XL tablet 25 mg  25 mg Oral DAILY 25 mg at 05/16/21 0847    [Held by provider] acetaZOLAMIDE (DIAMOX) tablet 250 mg  250 mg Oral BID 250 mg at 05/23/21 0819    nystatin (MYCOSTATIN) 100,000 unit/gram cream   Topical BID Given at 05/24/21 0858    traZODone (DESYREL) tablet 100 mg  100 mg Oral QHS 100 mg at 05/23/21 2205    oxyCODONE IR (ROXICODONE) tablet 5 mg  5 mg Oral Q6H PRN 5 mg at 05/23/21 1945    albuterol-ipratropium (DUO-NEB) 2.5 MG-0.5 MG/3 ML  3 mL Nebulization Q4H PRN 3 mL at 05/15/21 2003    insulin lispro (HUMALOG) injection   SubCUTAneous AC&HS 2 Units at 05/24/21 1148    glucose chewable tablet 16 g  4 Tablet Oral PRN      glucagon (GLUCAGEN) injection 1 mg  1 mg IntraMUSCular PRN      dextrose 10% infusion 0-250 mL  0-250 mL IntraVENous PRN      [Held by provider] arformoteroL (BROVANA) neb solution 15 mcg  15 mcg Nebulization BID RT 15 mcg at 05/17/21 0745    budesonide (PULMICORT) 500 mcg/2 ml nebulizer suspension  500 mcg Nebulization BID RT 500 mcg at 05/24/21 0751    sodium chloride (NS) flush 5-40 mL  5-40 mL IntraVENous Q8H 10 mL at 05/23/21 2206    sodium chloride (NS) flush 5-40 mL  5-40 mL IntraVENous PRN      acetaminophen (TYLENOL) tablet 650 mg  650 mg Oral Q6H PRN 650 mg at 05/23/21 1401    Or    acetaminophen (TYLENOL) suppository 650 mg  650 mg Rectal Q6H PRN      polyethylene glycol (MIRALAX) packet 17 g  17 g Oral DAILY PRN      senna (SENOKOT) tablet 8.6 mg  1  Tablet Oral DAILY PRN 8.6 mg at 05/19/21 1700    promethazine (PHENERGAN) tablet 12.5 mg  12.5 mg Oral Q6H PRN      Or    ondansetron (ZOFRAN) injection 4 mg  4 mg IntraVENous Q6H PRN 4 mg at 05/17/21 0956    aspirin chewable tablet 81 mg  81 mg Oral DAILY 81 mg at 05/24/21 0856    atorvastatin (LIPITOR) tablet 10 mg  10  mg Oral DAILY 10 mg at 05/24/21 0856    citalopram (CELEXA) tablet 40 mg  40 mg Oral DAILY 40 mg at 05/24/21 0856    [Held by provider] glipiZIDE (GLUCOTROL) tablet 5 mg  5 mg Oral BID WITH MEALS 5 mg at 05/17/21 0805    levothyroxine (SYNTHROID) tablet 150 mcg  150 mcg Oral ACB 150 mcg at 05/24/21 0627    montelukast (SINGULAIR) tablet 10 mg  10 mg Oral QHS 10 mg at 05/23/21 2205    sucralfate (CARAFATE) tablet 1 g  1 g Oral TID 1 g at 05/24/21 [id number]           Case discussed with:      Ubaldo GlassingMark Alejandra Barna, DO

## 2021-05-24 NOTE — Progress Notes (Deleted)
[facility]     Name: Deborah [name] MRN: [id number]   DOB: Apr 15, 1955 Hospital: [facility]   Date: 05/24/2021        Impression Plan   Acute respiratory failure  Hypoxia  Hypercapnea  Pulmonary infiltrates  ESBL UTI  OSA  Morbid obesity               Wean O2 to keep sats above 90%  Continue BiPAP nightly and when napping  Diuresis as BP allows. Pt currently still on Neo with borderline BP.   Continue midodrine  Duonebs  Continue meropenem for ESBL UTI  OOB into chair  PT/OT as BP allows  Needs outpatient sleep eval appt with CPAP interrogation            Radiology  (personally reviewed) CXR4/20/23: basilar infiltrates-overall improving       Subjective     Cc: shortness of breath    66 yo with PMHx diastolic CHF, recent strep bacteremia and OSA presenting with increasing SOB. Found to have increased leg swelling and increased infiltrates after missing several doses of diuretics at rehab center. Clinical scenario complicated further by ESBL UTI and septic shock. Pt has hx of mild asthma, denies COPD. Smoked 1 ppd for 25 years, quit over 10 years ago.     Interval history  Afebrile  BP soft/stable; on 30 of neo  Sats 100% on BiPAP; 96% on 2L  Bicarb 33 - trending down  Creat 1.13  proBNP 2206 - down from 2978   Blood cx no growth x 5 days - final  I/O: not completely documented    4/25 CXR: Pulmonary edema unchanged    4/25 ECHO: EF 60-65%; technically difficult with poor endocardial visualization    Review of Systems: Sleeping this morning but arouses easily to voice.  Voices no specific complaints.  Denies SOB.     A comprehensive review of systems was negative except for that written in the HPI.    Past Medical History:   Diagnosis Date    (HFpEF) heart failure with preserved ejection fraction (HCC)     Anxiety and depression     Aortic valve replaced     S/p bovine aortic valve replacement.    Asthma     Chronic narcotic use     Chronic obstructive pulmonary disease (HCC)      Chronic pain     CKD (chronic kidney disease), stage III (HCC)     Baseline creatinine is 1.3-1.4 with GFR in the 40s.    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)     GERD (gastroesophageal reflux disease)     History of vascular access device 04/13/2021    4 FR Single PICC for LTABX: R cephalic vessell length 48 CM Max P leave @ 1 CM out; Arm circumferenc 40 CM    Hyperlipidemia     Hypothyroidism     Morbid obesity (HCC)     Neuropathy     Obstructive sleep apnea     Rhinitis       Past Surgical History:   Procedure Laterality Date    COLONOSCOPY N/A 12/01/2020    COLONOSCOPY performed by Ian Malkin, MD at [facility] END[facility]COPY    HX AORTIC VALVE REPLACEMENT      Bovine bioprosthetic    HX PACEMAKER        Prior to Admission medications    Medication Sig Start Date End Date Taking? Authorizing Provider   bumetanide (  BUMEX) 1 mg tablet Take 1 Tablet by mouth daily. 04/25/21  Yes Horald Chestnut, DO   lidocaine 4 % patch Apply to low back  Apply patch to the affected area for 12 hours a day and remove for 12 hours a day. 04/24/21  Yes Horald Chestnut, DO   arformoteroL (BROVANA) 15 mcg/2 mL nebu neb solution 2 mL by Nebulization route two (2) times a day. 04/24/21  Yes Horald Chestnut, DO   miconazole (MICOTIN) 2 % topical powder Apply  to affected area two (2) times a day. APPLY TO bilateral breasts    Nursing, document site in comments 04/24/21  Yes Jamil, Arna Medici, DO   atorvastatin (LIPITOR) 10 mg tablet Take 1 Tablet by mouth daily.   Yes Provider, Historical   metoprolol succinate (TOPROL-XL) 25 mg XL tablet Take 1 Tablet by mouth daily. 02/21/21  Yes Thurston Pounds, MD   levothyroxine (SYNTHROID) 150 mcg tablet Take 1 Tablet by mouth Daily (before breakfast). 01/27/21  Yes Tefera, Mesfin, MD   aspirin 81 mg chewable tablet Take 1 Tablet by mouth daily. 12/03/20  Yes Carrolyn Meiers, MD   acetaminophen (TYLENOL) 500 mg tablet Take 2 Tablets by mouth every six (6) hours as needed for Pain.   Yes Provider, Historical   albuterol (PROVENTIL HFA,  VENTOLIN HFA, PROAIR HFA) 90 mcg/actuation inhaler Take 2 Puffs by inhalation every six (6) hours as needed.   Yes Provider, Historical   citalopram (CELEXA) 40 mg tablet Take 1 Tablet by mouth daily. 02/22/20  Yes Provider, Historical   fluticasone propionate (FLONASE) 50 mcg/actuation nasal spray 2 Sprays by Nasal route daily as needed.   Yes Provider, Historical   sucralfate (CARAFATE) 1 gram tablet Take 1 Tablet by mouth three (3) times daily.   Yes Provider, Historical   traZODone (DESYREL) 100 mg tablet Take 1 Tablet by mouth nightly. 12/31/19  Yes Provider, Historical   glimepiride (AMARYL) 2 mg tablet Take 1 Tablet by mouth two (2) times a day.   Yes Provider, Historical   montelukast (SINGULAIR) 10 mg tablet Take 1 Tablet by mouth nightly.   Yes Provider, Historical   cefTRIAXone 2 gram 2 g IV syringe 2 g by IntraVENous route every twenty-four (24) hours for 29 days. 04/24/21 05/23/21  Horald Chestnut, DO   naloxone (Narcan) 4 mg/actuation nasal spray Use 1 spray intranasally, then discard. Repeat with new spray every 2 min as needed for opioid overdose symptoms, alternating nostrils. 04/24/21   Horald Chestnut, DO     Current Facility-Administered Medications   Medication Dose Route Frequency    PHENYLephrine (NEO-SYNEPHRINE) 50 mg in 0.9% sodium chloride 250 mL (Vial2Bag)  10-100 mcg/min IntraVENous TITRATE    midodrine (PROAMATINE) tablet 10 mg  10 mg Oral TID WITH MEALS    amiodarone (CORDARONE) tablet 400 mg  400 mg Oral BID    [Held by provider] bumetanide (BUMEX) tablet 1 mg  1 mg Oral DAILY    meropenem (MERREM) 1 g in 0.9% sodium chloride (MBP/ADV) 50 mL MBP  1 g IntraVENous Q8H    L.acidophilus-paracasei-S.thermophil-bifidobacter (RISAQUAD) 8 billion cell capsule  1 Capsule Oral DAILY    albuterol-ipratropium (DUO-NEB) 2.5 MG-0.5 MG/3 ML  3 mL Nebulization Q6H RT    pantoprazole (PROTONIX) tablet 40 mg  40 mg Oral ACB    heparin (porcine) injection 5,000 Units  5,000 Units SubCUTAneous Q8H    [Held by  provider] metoprolol succinate (TOPROL-XL) XL tablet 25 mg  25 mg Oral DAILY    [  Held by provider] acetaZOLAMIDE (DIAMOX) tablet 250 mg  250 mg Oral BID    nystatin (MYCOSTATIN) 100,000 unit/gram cream   Topical BID    traZODone (DESYREL) tablet 100 mg  100 mg Oral QHS    insulin lispro (HUMALOG) injection   SubCUTAneous AC&HS    [Held by provider] arformoteroL (BROVANA) neb solution 15 mcg  15 mcg Nebulization BID RT    budesonide (PULMICORT) 500 mcg/2 ml nebulizer suspension  500 mcg Nebulization BID RT    sodium chloride (NS) flush 5-40 mL  5-40 mL IntraVENous Q8H    aspirin chewable tablet 81 mg  81 mg Oral DAILY    atorvastatin (LIPITOR) tablet 10 mg  10 mg Oral DAILY    citalopram (CELEXA) tablet 40 mg  40 mg Oral DAILY    [Held by provider] glipiZIDE (GLUCOTROL) tablet 5 mg  5 mg Oral BID WITH MEALS    levothyroxine (SYNTHROID) tablet 150 mcg  150 mcg Oral ACB    montelukast (SINGULAIR) tablet 10 mg  10 mg Oral QHS    sucralfate (CARAFATE) tablet 1 g  1 g Oral TID     Allergies   Allergen Reactions    Nitroglycerin Unknown (comments)     hypotension    Aloe Vera Rash    Hydrochlorothiazide Other (comments)     Reports 'kidneys dry up"     Tetanus And Diphther. Tox (Pf) Swelling     Swelling of arm and it turns black      Social History     Tobacco Use    Smoking status: Former     Packs/day: 1.00     Years: 40.00     Pack years: 40.00     Types: Cigarettes     Quit date: 03/21/2010     Years since quitting: 11.1    Smokeless tobacco: Never   Substance Use Topics    Alcohol use: Not Currently      Family History   Problem Relation Age of Onset    Hypertension Mother     Hypertension Father           Laboratory: I have personally reviewed the flowsheet and labs.     Recent Labs     05/24/21  0312 05/23/21  0338 05/22/21  0145   WBC 7.8 6.9 10.0   HGB 9.2* 9.0* 9.6*   HCT 31.2* 30.9* 32.0*   PLT 110* 90* 114*       Recent Labs     05/24/21  0312 05/23/21  0338 05/22/21  0145   NA 139 139 137   K 4.2 3.8 4.1   CL  107 106 101   CO2 33* 33* 36*   GLU 117* 122* 130*   BUN 23* 26* 35*   CREA 1.13* 1.08* 1.23*   CA 9.2 9.0 8.9   MG 2.2 2.2 2.2   PHOS 3.2 3.3 3.0         Objective:   Visit Vitals  BP (!) 106/53   Pulse 60   Temp 97.6 F (36.4 C)   Resp 13   Ht 5\' 7"  (1.702 m)   Wt 154.2 kg (339 lb 14.4 oz)   SpO2 100%   BMI 53.24 kg/m       Intake/Output Summary (Last 24 hours) at 05/24/2021 0825  Last data filed at 05/24/2021 0435  Gross per 24 hour   Intake 240 ml   Output 400 ml   Net -160 ml  EXAM:   GENERAL: sleepy but wakes easily, alert, obese HEENT:  anicteric, EOMI, no alar flaring or epistaxis, oral mucosa moist without cyanosis, NECK:  no jugular vein distention, no retractions, no thyromegaly or masses, LUNGS: distant, CTA, no w/r/rHEART:  Regular rate and rhythm with no MGR; +2 edema is present in LE, ABDOMEN:  soft with no tenderness, EXTREMITIES:  warm with no cyanosis, SKIN:  no jaundice or ecchymosis, and NEUROLOGIC:  alert and oriented, grossly non-focal    Amedeo Kinsman, PA  [facility] [place]

## 2021-05-24 NOTE — Progress Notes (Signed)
[place] [facility]  [address] [place], [place] [address]  [phone]      Hospitalist Progress Note      NAME: Deborah [name]   DOB:  1955/11/30  MRM:  [id number]    Date/Time of service: 05/24/2021  8:44 AM       Subjective:     Chief Complaint:  Patient was personally seen and examined by me during this time period.  Chart reviewed.  Remains hypotensive, on pressors gtt       Objective:       Vitals:       Last 24hrs VS reviewed since prior progress note. Most recent are:    Visit Vitals  BP 106/60   Pulse 63   Temp 97.6 F (36.4 C)   Resp 13   Ht 5\' 7"  (1.702 m)   Wt 154.2 kg (339 lb 14.4 oz)   SpO2 99%   BMI 53.24 kg/m     SpO2 Readings from Last 6 Encounters:   05/24/21 99%   04/24/21 97%   03/09/21 96%   01/26/21 90%   12/03/20 96%   11/05/20 92%    O2 Flow Rate (L/min): 2 l/min     Intake/Output Summary (Last 24 hours) at 05/24/2021 0844  Last data filed at 05/24/2021 0435  Gross per 24 hour   Intake 240 ml   Output 400 ml   Net -160 ml          Exam:     Physical Exam:    Gen:  Well-developed, well-nourished, morbidly obese, in no acute distress  HEENT:  Pink conjunctivae, PERRL, hearing intact to voice, moist mucous membranes  Neck:  Supple, without masses, thyroid non-tender  Resp:  No accessory muscle use, clear breath sounds without wheezes rales or rhonchi  Card:  No murmurs, normal S1, S2 without thrills, 3+ edema  Abd:  Soft, non-tender, non-distended, normoactive bowel sounds are present  Musc:  No cyanosis or clubbing  Skin:  No rashes  Neuro:  Cranial nerves 3-12 are grossly intact, generalized weakness, follows commands appropriately  Psych:  poor insight, oriented to person, place and time, alert    Medications Reviewed: (see below)    Lab Data Reviewed: (see below)    ______________________________________________________________________    Medications:     Current Facility-Administered Medications   Medication Dose Route Frequency    hydrocortisone (CORTEF) tablet 20  mg  20 mg Oral BID WITH MEALS    PHENYLephrine (NEO-SYNEPHRINE) 50 mg in 0.9% sodium chloride 250 mL (Vial2Bag)  10-100 mcg/min IntraVENous TITRATE    midodrine (PROAMATINE) tablet 10 mg  10 mg Oral TID WITH MEALS    amiodarone (CORDARONE) tablet 400 mg  400 mg Oral BID    [Held by provider] bumetanide (BUMEX) tablet 1 mg  1 mg Oral DAILY    meropenem (MERREM) 1 g in 0.9% sodium chloride (MBP/ADV) 50 mL MBP  1 g IntraVENous Q8H    L.acidophilus-paracasei-S.thermophil-bifidobacter (RISAQUAD) 8 billion cell capsule  1 Capsule Oral DAILY    albuterol-ipratropium (DUO-NEB) 2.5 MG-0.5 MG/3 ML  3 mL Nebulization Q6H RT    pantoprazole (PROTONIX) tablet 40 mg  40 mg Oral ACB    heparin (porcine) injection 5,000 Units  5,000 Units SubCUTAneous Q8H    [Held by provider] metoprolol succinate (TOPROL-XL) XL tablet 25 mg  25 mg Oral DAILY    [Held by provider] acetaZOLAMIDE (DIAMOX) tablet 250 mg  250 mg Oral BID  nystatin (MYCOSTATIN) 100,000 unit/gram cream   Topical BID    traZODone (DESYREL) tablet 100 mg  100 mg Oral QHS    oxyCODONE IR (ROXICODONE) tablet 5 mg  5 mg Oral Q6H PRN    albuterol-ipratropium (DUO-NEB) 2.5 MG-0.5 MG/3 ML  3 mL Nebulization Q4H PRN    insulin lispro (HUMALOG) injection   SubCUTAneous AC&HS    glucose chewable tablet 16 g  4 Tablet Oral PRN    glucagon (GLUCAGEN) injection 1 mg  1 mg IntraMUSCular PRN    dextrose 10% infusion 0-250 mL  0-250 mL IntraVENous PRN    [Held by provider] arformoteroL (BROVANA) neb solution 15 mcg  15 mcg Nebulization BID RT    budesonide (PULMICORT) 500 mcg/2 ml nebulizer suspension  500 mcg Nebulization BID RT    sodium chloride (NS) flush 5-40 mL  5-40 mL IntraVENous Q8H    sodium chloride (NS) flush 5-40 mL  5-40 mL IntraVENous PRN    acetaminophen (TYLENOL) tablet 650 mg  650 mg Oral Q6H PRN    Or    acetaminophen (TYLENOL) suppository 650 mg  650 mg Rectal Q6H PRN    polyethylene glycol (MIRALAX) packet 17 g  17 g Oral DAILY PRN    senna (SENOKOT) tablet 8.6 mg   1 Tablet Oral DAILY PRN    promethazine (PHENERGAN) tablet 12.5 mg  12.5 mg Oral Q6H PRN    Or    ondansetron (ZOFRAN) injection 4 mg  4 mg IntraVENous Q6H PRN    aspirin chewable tablet 81 mg  81 mg Oral DAILY    atorvastatin (LIPITOR) tablet 10 mg  10 mg Oral DAILY    citalopram (CELEXA) tablet 40 mg  40 mg Oral DAILY    [Held by provider] glipiZIDE (GLUCOTROL) tablet 5 mg  5 mg Oral BID WITH MEALS    levothyroxine (SYNTHROID) tablet 150 mcg  150 mcg Oral ACB    montelukast (SINGULAIR) tablet 10 mg  10 mg Oral QHS    sucralfate (CARAFATE) tablet 1 g  1 g Oral TID          Lab Review:     Recent Labs     05/24/21  0312 05/23/21  0338 05/22/21  0145   WBC 7.8 6.9 10.0   HGB 9.2* 9.0* 9.6*   HCT 31.2* 30.9* 32.0*   PLT 110* 90* 114*       Recent Labs     05/24/21  0312 05/23/21  0338 05/22/21  0145   NA 139 139 137   K 4.2 3.8 4.1   CL 107 106 101   CO2 33* 33* 36*   GLU 117* 122* 130*   BUN 23* 26* 35*   CREA 1.13* 1.08* 1.23*   CA 9.2 9.0 8.9   MG 2.2 2.2 2.2   PHOS 3.2 3.3 3.0       Lab Results   Component Value Date/Time    Glucose (POC) 131 (H) 05/24/2021 07:30 AM    Glucose (POC) 206 (H) 05/23/2021 09:01 PM    Glucose (POC) 182 (H) 05/23/2021 04:39 PM    Glucose (POC) 220 (H) 05/23/2021 11:08 AM    Glucose (POC) 147 (H) 05/23/2021 07:43 AM          Assessment / Plan:     66 yo hx of HTN, DM, dCHF EF 50-55%, PVCs s/p pacer, bioprosthetic AVR, recent group B strep bacteremia, presented w/ hypoxia, dCHF, hypotension, ESBL UTI    1) Hypotension: shock  POA, still w/ refractory hypotension.  Unclear etiology.  Unlikely sepsis, cardiogenic shock.  Could have mild adrenal insuff.  Echo with EF 60-65%.  Cosyntropin stim test on 04/26 showed appropriate cortisol response, but has low baseline cortisol.  Will hold all diuretics.  Will start low dose hydrocortisone.  Cont midodrine.  Cont pressors gtt (wean as tolerated)     2) Acute resp failure/hypoxia/hypercapnea: due to CHF, OSA, obesity hypoventilation.  Will cont O2,  diamox prn, bipap.  Pulm following    3) ESBL E. Coli UTI/recent group B strep bacteremia: was receiving IV CTX at home (till 04/26).  Now on IV meropenem.  Will cont abx till 04/29 per ID    4) Acute on chronic dCHF: EF 60-65%%.  Still with significant edema.  Holding bumex, diamox due to hypotension.  Cards following    5) PVCs/bioprosthetic AVR: s/p pacer.  Cont ASA, statin    6) Anemia/thrombocytopenia: likely due to sepsis.  Has some iron def.  S/p IV iron.  Monitor CBC.  Consult Hematology if worse    7) DM type 2: A1C 5.7%.  Cont SSI    8) Weakness: cont PT/OT.  Will need HH    **Prior records, notes, labs, radiology, and medications reviewed in [facility]**    Total time spent with patient care: 40 Minutes **I personally saw and examined the patient during this time period**                 Care Plan discussed with: Patient, nursing     Discussed:  Care Plan    Prophylaxis:  Hep SQ    Disposition:  SNF/LTC vs HH            ___________________________________________________    Attending Physician: Dayna Barker, MD

## 2021-05-25 ENCOUNTER — Encounter: Attending: Clinical Cardiac Electrophysiology | Primary: Family

## 2021-05-25 LAB — GLUCOSE, POC
Glucose (POC): 259 mg/dL — ABNORMAL HIGH (ref 65–117)
Glucose (POC): 107 mg/dL (ref 65–117)
Glucose (POC): 205 mg/dL — ABNORMAL HIGH (ref 65–117)
Glucose (POC): 172 mg/dL — ABNORMAL HIGH (ref 65–117)

## 2021-05-25 LAB — POCT GLUCOSE
POC Glucose: 259 mg/dL — ABNORMAL HIGH (ref 65–117)
POC Glucose: 107 mg/dL (ref 65–117)
POC Glucose: 205 mg/dL — ABNORMAL HIGH (ref 65–117)
POC Glucose: 172 mg/dL — ABNORMAL HIGH (ref 65–117)

## 2021-05-25 MED ORDER — SODIUM CHLORIDE 0.9 % IV PIGGY BACK
1 gram | Freq: Three times a day (TID) | INTRAVENOUS | Status: AC
Start: 2021-05-25 — End: 2021-05-27
  Administered 2021-05-25 – 2021-05-27 (×4): via INTRAVENOUS

## 2021-05-25 MED ORDER — BUMETANIDE 0.25 MG/ML IJ SOLN
0.25 mg/mL | Freq: Two times a day (BID) | INTRAMUSCULAR | Status: AC
Start: 2021-05-25 — End: 2021-05-28
  Administered 2021-05-27 – 2021-05-28 (×3): via INTRAVENOUS

## 2021-05-25 MED ORDER — MEXILETINE 150 MG CAP
150 mg | Freq: Three times a day (TID) | ORAL | Status: AC
Start: 2021-05-25 — End: 2021-05-29
  Administered 2021-05-25 – 2021-05-29 (×12): via ORAL

## 2021-05-25 MED FILL — MIDODRINE 5 MG TAB: 5 mg | ORAL | Qty: 2

## 2021-05-25 MED FILL — HEPARIN (PORCINE) 5,000 UNIT/ML IJ SOLN: 5000 unit/mL | INTRAMUSCULAR | Qty: 1

## 2021-05-25 MED FILL — IPRATROPIUM-ALBUTEROL 2.5 MG-0.5 MG/3 ML NEB SOLUTION: 2.5 mg-0.5 mg/3 ml | RESPIRATORY_TRACT | Qty: 3

## 2021-05-25 MED FILL — BUMETANIDE 0.25 MG/ML IJ SOLN: 0.25 mg/mL | INTRAMUSCULAR | Qty: 4

## 2021-05-25 MED FILL — BUDESONIDE 0.5 MG/2 ML NEB SUSPENSION: 0.5 mg/2 mL | RESPIRATORY_TRACT | Qty: 1

## 2021-05-25 MED FILL — CITALOPRAM 20 MG TAB: 20 mg | ORAL | Qty: 2

## 2021-05-25 MED FILL — INSULIN LISPRO 100 UNIT/ML INJECTION: 100 unit/mL | SUBCUTANEOUS | Qty: 2

## 2021-05-25 MED FILL — MEROPENEM 1 GRAM IV SOLR: 1 gram | INTRAVENOUS | Qty: 1

## 2021-05-25 MED FILL — ATORVASTATIN 10 MG TAB: 10 mg | ORAL | Qty: 1

## 2021-05-25 MED FILL — CARAFATE 1 GRAM TABLET: 1 gram | ORAL | Qty: 1

## 2021-05-25 MED FILL — MONTELUKAST 10 MG TAB: 10 mg | ORAL | Qty: 1

## 2021-05-25 MED FILL — AMIODARONE 200 MG TAB: 200 mg | ORAL | Qty: 2

## 2021-05-25 MED FILL — RISAQUAD 8 BILLION CELL CAPSULE: 8 billion cell | ORAL | Qty: 1

## 2021-05-25 MED FILL — PANTOPRAZOLE 40 MG TAB, DELAYED RELEASE: 40 mg | ORAL | Qty: 1

## 2021-05-25 MED FILL — PHENYLEPHRINE 10 MG/ML INJECTION: 10 mg/mL | INTRAMUSCULAR | Qty: 5

## 2021-05-25 MED FILL — TRAZODONE 50 MG TAB: 50 mg | ORAL | Qty: 2

## 2021-05-25 MED FILL — INSULIN LISPRO 100 UNIT/ML INJECTION: 100 unit/mL | SUBCUTANEOUS | Qty: 3

## 2021-05-25 MED FILL — OXYCODONE 5 MG TAB: 5 mg | ORAL | Qty: 1

## 2021-05-25 MED FILL — MEXILETINE 150 MG CAP: 150 mg | ORAL | Qty: 1

## 2021-05-25 MED FILL — ASPIRIN 81 MG CHEWABLE TAB: 81 mg | ORAL | Qty: 1

## 2021-05-25 MED FILL — ACETAMINOPHEN 325 MG TABLET: 325 mg | ORAL | Qty: 2

## 2021-05-25 MED FILL — HYDROCORTISONE 10 MG TAB: 10 mg | ORAL | Qty: 2

## 2021-05-25 MED FILL — LEVOTHYROXINE 150 MCG TAB: 150 mcg | ORAL | Qty: 1

## 2021-05-25 NOTE — Progress Notes (Signed)
Progress  Notes by Dayna Barkero, Earley Grobe B, MD at 05/25/21 [id number]                Author: Dayna Barkero, Ciarra Braddy B, MD  Service: Internal Medicine  Author Type: Physician       Filed: 05/25/21 0920  Date of Service: 05/25/21 0918  Status: Signed          Editor: Dayna Barkero, Sierria Bruney B, MD (Physician)               [facility]   [address], [place], [place] [id number]   [phone]         Hospitalist Progress Note         NAME: Deborah LoftDonna [name]    DOB:  1955-06-02   MRM:  [id number]      Date/Time of service: 05/25/2021  9:18 AM               Subjective:        Chief Complaint:  Patient was personally seen and examined by me during this time period.  Chart reviewed.  Off pressors.  Feeling less fatigue                Objective:             Vitals:           Last 24hrs VS reviewed since prior progress note. Most recent are:      Visit Vitals      BP  (!) 106/44     Pulse  82     Temp  97.5 F (36.4 C)     Resp  14     Ht  5\' 7"  (1.702 m)     Wt  154.5 kg (340 lb 9.6 oz)     SpO2  100%        BMI  53.35 kg/m        SpO2 Readings from Last 6 Encounters:      05/25/21  100%      04/24/21  97%      03/09/21  96%      01/26/21  90%      12/03/20  96%      11/05/20  92%           O2 Flow Rate (L/min): 2 l/min        Intake/Output Summary (Last 24 hours) at 05/25/2021 [id number]   Last data filed at 05/25/2021 0305     Gross per 24 hour        Intake  940 ml        Output  750 ml        Net  190 ml                 Exam:        Physical Exam:      Gen:  Well-developed, well-nourished, morbidly obese, in no acute distress   HEENT:  Pink conjunctivae, PERRL, hearing intact to voice, moist mucous membranes   Neck:  Supple, without masses, thyroid non-tender   Resp:  No accessory muscle use, clear breath sounds without wheezes rales or rhonchi   Card:  No murmurs, normal S1, S2 without thrills, 2+ edema   Abd:  Soft, non-tender, non-distended, normoactive bowel sounds are present   Musc:  No cyanosis or clubbing   Skin:  No rashes   Neuro:   Cranial nerves 3-12 are grossly intact, generalized weakness,  follows commands appropriately   Psych:  poor insight, oriented to person, place and time, alert      Medications Reviewed: (see below)      Lab Data Reviewed: (see below)      ______________________________________________________________________        Medications:          Current Facility-Administered Medications          Medication  Dose  Route  Frequency           ?  hydrocortisone (CORTEF) tablet 20 mg   20 mg  Oral  BID WITH MEALS     ?  PHENYLephrine (NEO-SYNEPHRINE) 50 mg in 0.9% sodium chloride 250 mL (Vial2Bag)   10-100 mcg/min  IntraVENous  TITRATE     ?  midodrine (PROAMATINE) tablet 10 mg   10 mg  Oral  TID WITH MEALS     ?  amiodarone (CORDARONE) tablet 400 mg   400 mg  Oral  BID     ?  [Held by provider] bumetanide (BUMEX) tablet 1 mg   1 mg  Oral  DAILY     ?  meropenem (MERREM) 1 g in 0.9% sodium chloride (MBP/ADV) 50 mL MBP   1 g  IntraVENous  Q8H           ?  L.acidophilus-paracasei-S.thermophil-bifidobacter (RISAQUAD) 8 billion cell capsule   1 Capsule  Oral  DAILY           ?  albuterol-ipratropium (DUO-NEB) 2.5 MG-0.5 MG/3 ML   3 mL  Nebulization  Q6H RT     ?  pantoprazole (PROTONIX) tablet 40 mg   40 mg  Oral  ACB     ?  heparin (porcine) injection 5,000 Units   5,000 Units  SubCUTAneous  Q8H     ?  [Held by provider] metoprolol succinate (TOPROL-XL) XL tablet 25 mg   25 mg  Oral  DAILY     ?  [Held by provider] acetaZOLAMIDE (DIAMOX) tablet 250 mg   250 mg  Oral  BID     ?  nystatin (MYCOSTATIN) 100,000 unit/gram cream     Topical  BID     ?  traZODone (DESYREL) tablet 100 mg   100 mg  Oral  QHS     ?  oxyCODONE IR (ROXICODONE) tablet 5 mg   5 mg  Oral  Q6H PRN     ?  albuterol-ipratropium (DUO-NEB) 2.5 MG-0.5 MG/3 ML   3 mL  Nebulization  Q4H PRN     ?  insulin lispro (HUMALOG) injection     SubCUTAneous  AC&HS     ?  glucose chewable tablet 16 g   4 Tablet  Oral  PRN     ?  glucagon (GLUCAGEN) injection 1 mg   1 mg   IntraMUSCular  PRN     ?  dextrose 10% infusion 0-250 mL   0-250 mL  IntraVENous  PRN     ?  [Held by provider] arformoteroL (BROVANA) neb solution 15 mcg   15 mcg  Nebulization  BID RT     ?  budesonide (PULMICORT) 500 mcg/2 ml nebulizer suspension   500 mcg  Nebulization  BID RT     ?  sodium chloride (NS) flush 5-40 mL   5-40 mL  IntraVENous  Q8H     ?  sodium chloride (NS) flush 5-40 mL   5-40 mL  IntraVENous  PRN     ?  acetaminophen (TYLENOL) tablet 650 mg   650 mg  Oral  Q6H PRN          Or           ?  acetaminophen (TYLENOL) suppository 650 mg   650 mg  Rectal  Q6H PRN           ?  polyethylene glycol (MIRALAX) packet 17 g   17 g  Oral  DAILY PRN     ?  senna (SENOKOT) tablet 8.6 mg   1 Tablet  Oral  DAILY PRN     ?  promethazine (PHENERGAN) tablet 12.5 mg   12.5 mg  Oral  Q6H PRN          Or           ?  ondansetron (ZOFRAN) injection 4 mg   4 mg  IntraVENous  Q6H PRN     ?  aspirin chewable tablet 81 mg   81 mg  Oral  DAILY     ?  atorvastatin (LIPITOR) tablet 10 mg   10 mg  Oral  DAILY     ?  citalopram (CELEXA) tablet 40 mg   40 mg  Oral  DAILY     ?  [Held by provider] glipiZIDE (GLUCOTROL) tablet 5 mg   5 mg  Oral  BID WITH MEALS     ?  levothyroxine (SYNTHROID) tablet 150 mcg   150 mcg  Oral  ACB     ?  montelukast (SINGULAIR) tablet 10 mg   10 mg  Oral  QHS           ?  sucralfate (CARAFATE) tablet 1 g   1 g  Oral  TID                 Lab Review:          Recent Labs            05/24/21   0312  05/23/21   0338     WBC  7.8  6.9     HGB  9.2*  9.0*     HCT  31.2*  30.9*         PLT  110*  90*             Recent Labs            05/24/21   0312  05/23/21   0338     NA  139  139     K  4.2  3.8     CL  107  106     CO2  33*  33*     GLU  117*  122*     BUN  23*  26*     CREA  1.13*  1.08*     CA  9.2  9.0     MG  2.2  2.2         PHOS  3.2  3.3             Lab Results         Component  Value  Date/Time            Glucose (POC)  107  05/25/2021 08:01 AM       Glucose (POC)  259 (H)  05/24/2021 08:59 PM        Glucose (POC)  149 (H)  05/24/2021 04:16 PM       Glucose (POC)  183 (H)  05/24/2021 11:37 AM            Glucose (POC)  131 (H)  05/24/2021 07:30 AM                    Assessment / Plan:        65 yo hx of HTN, DM, dCHF EF 50-55%, PVCs s/p pacer, bioprosthetic AVR, recent group B strep bacteremia, presented w/ hypoxia, dCHF, hypotension, ESBL UTI      1) Hypotension: has had refractory hypotension, but seems to be improving.  Unclear etiology.  Unlikely sepsis, cardiogenic shock.  Could have mild adrenal insuff.  Echo with EF 60-65%.  Cosyntropin stim test on 04/26 showed appropriate cortisol response,  but has low baseline cortisol.  Cont to hold all diuretics.  Will cont low dose hydrocortisone.  Cont midodrine.  Wean off pressors gtt      2) Acute resp failure/hypoxia/hypercapnea: due to CHF, OSA, obesity hypoventilation.  Will cont O2, diamox prn, bipap.  Pulm following      3) ESBL E. Coli UTI/recent group B strep bacteremia: was receiving IV CTX at home (till 04/26).  Now on IV meropenem.  Will cont abx till 04/29 per ID      4) Acute on chronic dCHF: EF 60-65%%.  Still with significant edema.  Holding bumex, diamox due to hypotension.  Cards following      5) PVCs/bioprosthetic AVR: s/p pacer.  Cont ASA, statin      6) Anemia/thrombocytopenia: likely due to sepsis.  Has some iron def.  S/p IV iron.  Monitor CBC      7) DM type 2: A1C 5.7%.  Cont SSI      8) Weakness: cont PT/OT.  Will need HH once no long hypotensive       **Prior records, notes, labs, radiology, and medications reviewed in [facility]**      Total time spent with patient care: 35 Minutes **I personally saw and examined the patient during this time period**                   Care Plan discussed with: Patient, nursing       Discussed:  Care Plan      Prophylaxis:  Hep SQ      Disposition:  SNF/LTC vs HH              ___________________________________________________      Attending Physician: Dayna Barker, MD

## 2021-05-25 NOTE — Progress Notes (Unsigned)
[facility]     Name: Deborah [name] MRN: [id number]   DOB: 11-15-1955 Hospital: [facility]   Date: 05/25/2021        Impression Plan   Acute respiratory failure  Hypoxia  Hypercapnea  Pulmonary infiltrates  ESBL UTI  History of GBS bacteremia complicated by pacemaker line vegetation (completed 6 week course of CTX)  OSA  Morbid obesity               Wean O2 to keep sats above 90%  Continue BiPAP nightly and when napping  Diuresis as BP allows. Pt currently still on Neo with borderline BP.   Continue midodrine  Duonebs  Continue meropenem for ESBL UTI  OOB into chair  PT/OT as BP allows  Needs outpatient sleep eval appt with CPAP interrogation            Radiology  (personally reviewed) CXR4/20/23: basilar infiltrates-overall improving       Subjective     Cc: shortness of breath    66 yo with PMHx diastolic CHF, recent strep bacteremia and OSA presenting with increasing SOB. Found to have increased leg swelling and increased infiltrates after missing several doses of diuretics at rehab center. Clinical scenario complicated further by ESBL UTI and septic shock. Pt has hx of mild asthma, denies COPD. Smoked 1 ppd for 25 years, quit over 10 years ago.     Interval history  Afebrile  BP soft/stable; on 15 of neo  Sats 100% on BiPAP; 92-95% on 2L  Bicarb 33 - trending down  Creat 1.13  proBNP 2206 - down from 2978   Blood cx no growth x 5 days - final  750 ml UOP + 3 unmeasured occurences    4/25 CXR: Pulmonary edema unchanged    4/25 ECHO: EF 60-65%; technically difficult with poor endocardial visualization    Review of Systems: Sleeping this morning, BiPAP in place.  Appears comfortable.  No issues noted overnight.  Making progress with therapy.    A comprehensive review of systems was negative except for that written in the HPI.    Past Medical History:   Diagnosis Date    (HFpEF) heart failure with preserved ejection fraction (HCC)     Anxiety and depression     Aortic valve replaced      S/p bovine aortic valve replacement.    Asthma     Chronic narcotic use     Chronic obstructive pulmonary disease (HCC)     Chronic pain     CKD (chronic kidney disease), stage III (HCC)     Baseline creatinine is 1.3-1.4 with GFR in the 40s.    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)     GERD (gastroesophageal reflux disease)     History of vascular access device 04/13/2021    4 FR Single PICC for LTABX: R cephalic vessell length 48 CM Max P leave @ 1 CM out; Arm circumferenc 40 CM    Hyperlipidemia     Hypothyroidism     Morbid obesity (HCC)     Neuropathy     Obstructive sleep apnea     Rhinitis       Past Surgical History:   Procedure Laterality Date    COLONOSCOPY N/A 12/01/2020    COLONOSCOPY performed by Ian Malkin, MD at [facility] ENDOSCOPY    HX AORTIC VALVE REPLACEMENT      Bovine bioprosthetic    HX PACEMAKER  Prior to Admission medications    Medication Sig Start Date End Date Taking? Authorizing Provider   bumetanide (BUMEX) 1 mg tablet Take 1 Tablet by mouth daily. 04/25/21  Yes Horald ChestnutJamil, Nora, DO   lidocaine 4 % patch Apply to low back  Apply patch to the affected area for 12 hours a day and remove for 12 hours a day. 04/24/21  Yes Horald ChestnutJamil, Nora, DO   arformoteroL (BROVANA) 15 mcg/2 mL nebu neb solution 2 mL by Nebulization route two (2) times a day. 04/24/21  Yes Horald ChestnutJamil, Nora, DO   miconazole (MICOTIN) 2 % topical powder Apply  to affected area two (2) times a day. APPLY TO bilateral breasts    Nursing, document site in comments 04/24/21  Yes Jamil, Arna MediciNora, DO   atorvastatin (LIPITOR) 10 mg tablet Take 1 Tablet by mouth daily.   Yes Provider, Historical   metoprolol succinate (TOPROL-XL) 25 mg XL tablet Take 1 Tablet by mouth daily. 02/21/21  Yes Thurston PoundsNgo, Matthew, MD   levothyroxine (SYNTHROID) 150 mcg tablet Take 1 Tablet by mouth Daily (before breakfast). 01/27/21  Yes Tefera, Mesfin, MD   aspirin 81 mg chewable tablet Take 1 Tablet by mouth daily. 12/03/20  Yes Carrolyn MeiersKhan, Salman, MD   acetaminophen (TYLENOL) 500 mg  tablet Take 2 Tablets by mouth every six (6) hours as needed for Pain.   Yes Provider, Historical   albuterol (PROVENTIL HFA, VENTOLIN HFA, PROAIR HFA) 90 mcg/actuation inhaler Take 2 Puffs by inhalation every six (6) hours as needed.   Yes Provider, Historical   citalopram (CELEXA) 40 mg tablet Take 1 Tablet by mouth daily. 02/22/20  Yes Provider, Historical   fluticasone propionate (FLONASE) 50 mcg/actuation nasal spray 2 Sprays by Nasal route daily as needed.   Yes Provider, Historical   sucralfate (CARAFATE) 1 gram tablet Take 1 Tablet by mouth three (3) times daily.   Yes Provider, Historical   traZODone (DESYREL) 100 mg tablet Take 1 Tablet by mouth nightly. 12/31/19  Yes Provider, Historical   glimepiride (AMARYL) 2 mg tablet Take 1 Tablet by mouth two (2) times a day.   Yes Provider, Historical   montelukast (SINGULAIR) 10 mg tablet Take 1 Tablet by mouth nightly.   Yes Provider, Historical   naloxone (Narcan) 4 mg/actuation nasal spray Use 1 spray intranasally, then discard. Repeat with new spray every 2 min as needed for opioid overdose symptoms, alternating nostrils. 04/24/21   Horald ChestnutJamil, Nora, DO     Current Facility-Administered Medications   Medication Dose Route Frequency    hydrocortisone (CORTEF) tablet 20 mg  20 mg Oral BID WITH MEALS    PHENYLephrine (NEO-SYNEPHRINE) 50 mg in 0.9% sodium chloride 250 mL (Vial2Bag)  10-100 mcg/min IntraVENous TITRATE    midodrine (PROAMATINE) tablet 10 mg  10 mg Oral TID WITH MEALS    amiodarone (CORDARONE) tablet 400 mg  400 mg Oral BID    [Held by provider] bumetanide (BUMEX) tablet 1 mg  1 mg Oral DAILY    meropenem (MERREM) 1 g in 0.9% sodium chloride (MBP/ADV) 50 mL MBP  1 g IntraVENous Q8H    L.acidophilus-paracasei-S.thermophil-bifidobacter (RISAQUAD) 8 billion cell capsule  1 Capsule Oral DAILY    albuterol-ipratropium (DUO-NEB) 2.5 MG-0.5 MG/3 ML  3 mL Nebulization Q6H RT    pantoprazole (PROTONIX) tablet 40 mg  40 mg Oral ACB    heparin (porcine) injection 5,000  Units  5,000 Units SubCUTAneous Q8H    [Held by provider] metoprolol succinate (TOPROL-XL) XL tablet 25 mg  25 mg Oral DAILY    [Held by provider] acetaZOLAMIDE (DIAMOX) tablet 250 mg  250 mg Oral BID    nystatin (MYCOSTATIN) 100,000 unit/gram cream   Topical BID    traZODone (DESYREL) tablet 100 mg  100 mg Oral QHS    insulin lispro (HUMALOG) injection   SubCUTAneous AC&HS    [Held by provider] arformoteroL (BROVANA) neb solution 15 mcg  15 mcg Nebulization BID RT    budesonide (PULMICORT) 500 mcg/2 ml nebulizer suspension  500 mcg Nebulization BID RT    sodium chloride (NS) flush 5-40 mL  5-40 mL IntraVENous Q8H    aspirin chewable tablet 81 mg  81 mg Oral DAILY    atorvastatin (LIPITOR) tablet 10 mg  10 mg Oral DAILY    citalopram (CELEXA) tablet 40 mg  40 mg Oral DAILY    [Held by provider] glipiZIDE (GLUCOTROL) tablet 5 mg  5 mg Oral BID WITH MEALS    levothyroxine (SYNTHROID) tablet 150 mcg  150 mcg Oral ACB    montelukast (SINGULAIR) tablet 10 mg  10 mg Oral QHS    sucralfate (CARAFATE) tablet 1 g  1 g Oral TID     Allergies   Allergen Reactions    Nitroglycerin Unknown (comments)     hypotension    Aloe Vera Rash    Hydrochlorothiazide Other (comments)     Reports 'kidneys dry up"     Tetanus And Diphther. Tox (Pf) Swelling     Swelling of arm and it turns black      Social History     Tobacco Use    Smoking status: Former     Packs/day: 1.00     Years: 40.00     Pack years: 40.00     Types: Cigarettes     Quit date: 03/21/2010     Years since quitting: 11.1    Smokeless tobacco: Never   Substance Use Topics    Alcohol use: Not Currently      Family History   Problem Relation Age of Onset    Hypertension Mother     Hypertension Father           Laboratory: I have personally reviewed the flowsheet and labs.     Recent Labs     05/24/21  0312 05/23/21  0338   WBC 7.8 6.9   HGB 9.2* 9.0*   HCT 31.2* 30.9*   PLT 110* 90*       Recent Labs     05/24/21  0312 05/23/21  0338   NA 139 139   K 4.2 3.8   CL 107 106    CO2 33* 33*   GLU 117* 122*   BUN 23* 26*   CREA 1.13* 1.08*   CA 9.2 9.0   MG 2.2 2.2   PHOS 3.2 3.3         Objective:   Visit Vitals  BP (!) 103/57 (BP 1 Location: Left lower arm, BP Patient Position: At rest)   Pulse 82   Temp 97.5 F (36.4 C)   Resp 14   Ht  (1.702 m)   Wt 154.5 kg (340 lb 9.6 oz)   SpO2 100%   BMI 53.35 kg/m       Intake/Output Summary (Last 24 hours) at 05/25/2021 0813  Last data filed at 05/25/2021 0305  Gross per 24 hour   Intake 940 ml   Output 750 ml   Net 190 ml  EXAM:   GENERAL: sleepy but wakes easily, alert, obese HEENT:  anicteric, EOMI, no alar flaring or epistaxis, oral mucosa moist without cyanosis, NECK:  no jugular vein distention, no retractions, no thyromegaly or masses, LUNGS: distant, CTA, no w/r/rHEART:  Regular rate and rhythm with no MGR; +2 edema is present in LE, ABDOMEN:  soft with no tenderness, EXTREMITIES:  warm with no cyanosis, SKIN:  no jaundice or ecchymosis, and NEUROLOGIC:  alert and oriented, grossly non-focal    Amedeo Kinsman, PA  [facility]

## 2021-05-25 NOTE — Progress Notes (Signed)
Referral source:   Naliah Eddington at [facility] in [facility] 3 PROG CARE TELE 2. I reviewed the medical record prior to this encounter.    Spiritual Assessment:   Deborah [name] was sitting up in a chair at the bedside; she appeared in fairly good spirits.    Patient shared that she had received both good and bad news. Said the good news was that she would probably be discharged in the next couple of days. However, the not so good news was that she would need to be readmitted in about a mont tho have an operative procedure. She said that she was disappointed but not overly concerned. She said that she had had multiple surgeries and medical procedures over the years and if God decided it was her time to die, she was a peace with that. She stated that she was not a part of any faith community but was a person of very strong faith in God. She shared that her greatest concern was for her children and grandchildren and who would be available to provide them support. Acknowledged patient's feelings and concerns and offered words of support. With her permission, had prayer on behalf of patient and family.    Deborah [name] also shared about concerns regarding her relationship with her siblings and the emotional pains she felt as a result of those relationships.    Outcome: Deborah [name] clarified beliefs related to illness, healing, and medical care and expressed appreciation for pastoral support and the opportunity to share her thoughts and feelings.     Plan of Care: Patient is aware of chaplain availability and was encouraged to request support as needed. The chaplain on-call can be reached at [phone]).    Rev. Donnelly Stager MDiv, Deborah, [facility]  Staff Chaplain

## 2021-05-25 NOTE — Progress Notes (Signed)
Progress  Notes by Freddy Finner. at 05/25/21 1713                Author: Freddy Finner.  Service: --  Author Type: Care Management       Filed: 05/25/21 1715  Date of Service: 05/25/21 1713  Status: Signed          Editor: Freddy Finner (Care Management)               Transition of Care Plan: RUR -30%, LOS 16 days   1.  Medical management continues   2.  PT/OT following - hh v snf recommended (pt prefers hh)   3.  Remains on IV abx-meropenem through 4/29- ID following   4.  Pulmonology following   5.  Cardiology following   6.  Currently on 2L O2 nc; cpap at night   7.  Needs a new cpap machine & needs a sleep study   8.  CM following- patient is a readmission.  She lives with her son and daughter-in-law.  HH has been arranged with [facility].   L.Neely, RN

## 2021-05-25 NOTE — Progress Notes (Signed)
Problem: Mobility Impaired (Adult and Pediatric)  Goal: *Acute Goals and Plan of Care (Insert Text)  Description: FUNCTIONAL STATUS PRIOR TO ADMISSION: Patient was modified independent using a single point cane for household and community amb. Recent hospital admission and d/c to SNF [facility] x last 2+ weeks then readmit    HOME SUPPORT PRIOR TO ADMISSION: The patient lived with son's family but did not require assist.    Physical Therapy Goals  Revised 05/19/2021; Continue goals 05/25/21  1.  Patient will move from supine to sit and sit to supine , scoot up and down, and roll side to side in bed with modified independence within 7 day(s).  2.  Patient will transfer from bed to chair and chair to bed with modified independence using the least restrictive device within 7 day(s).  3.  Patient will perform sit to stand with modified independence within 7 day(s).  4.  Patient will ambulate with modified independence for 50 feet with the least restrictive device within 7 day(s).   5.  Patient will ascend/descend 5 stairs with 1 handrail(s) with modified independence within 7 day(s).    See earlier PT notes for prior goals  Outcome: Progressing Towards Goal     PHYSICAL THERAPY TREATMENT: WEEKLY REASSESSMENT  Patient: Deborah [name] (66 y.o. female)  Date: 05/25/2021  Primary Diagnosis: Acute on chronic heart failure with preserved ejection fraction (HFpEF) (HCC) [I50.33]       Precautions:   Contact, Fall      ASSESSMENT  Patient continues with skilled PT services and is progressing towards goals slowly. Since most recent PT re-evaluation pt has intermittently participated in gait training, consistently engaged in LE exercises, and consistently performed transfers. Overall she is requiring decreased assistance for transfers and gait while activity tolerance slowly progresses. She continues to present with fluctuating BP and intermittent hypotension below current MAP goal (60mm Hg or greater per RN). Pt off IV pressor support  today. She has tolerated gait distances as far as 14' though has experienced decreased BP after this activity. Today she participates in commode transfer, repeated sit to stand training with focus of breathing techniques, and brief ambulation using RW. MAP above goal throughout session with activity tolerance limited by subjective reports of R>L knee weakness and discomfort and dyspnea on exertion. SpO2 91-95% with activity using 2LNCO2 with proper pleth and 88% occasionally with poor pleth. She remains with decreased LE strength and AROM, decreased endurance, impaired dynamic standing balance, and limited functional mobility.      Pulse position BP SpO2 all using 2LNCO2   05/25/21 1345 88 Sitting;post ambulation 103/69 94 %   05/25/21 1340 86 Sitting; post sit to stand training (!) 107/45 94 %   05/25/21 1300 86 Sitting; after bedside commode transfer (!) 81/47 96 %                                Current Level of Function Impacting Discharge (mobility/balance): CGA to min A     Functional Outcome Measure:  The patient scored 15 on the AMPAC.    Other factors to consider for discharge: none additional         PLAN :  Goals have been updated based on progression since last assessment.  Patient continues to benefit from skilled intervention to address the above impairments.    Recommendations and Planned Interventions: bed mobility training, transfer training, gait training, therapeutic exercises, neuromuscular re-education, edema management/control,  patient and family training/education, and therapeutic activities      Frequency/Duration: Patient will be followed by physical therapy:  5 times a week to address goals.    Recommendation for discharge: (in order for the patient to meet his/her long term goals)  Therapy up to 5 days/week in SNF setting; pt intending to discharge to private residence with HHPT    This discharge recommendation:  Has not yet been discussed the attending provider and/or case management    IF  patient discharges home will need the following DME: to be determined (TBD)         SUBJECTIVE:   Patient stated "I've learned to appreciate the surprise," re: medical progress.    Pt received seated on bedside commode, agreeable to PT and cleared by RN.      OBJECTIVE DATA SUMMARY:   HISTORY:    Past Medical History:   Diagnosis Date    (HFpEF) heart failure with preserved ejection fraction (HCC)     Anxiety and depression     Aortic valve replaced     S/p bovine aortic valve replacement.    Asthma     Chronic narcotic use     Chronic obstructive pulmonary disease (HCC)     Chronic pain     CKD (chronic kidney disease), stage III (HCC)     Baseline creatinine is 1.3-1.4 with GFR in the 40s.    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)     GERD (gastroesophageal reflux disease)     History of vascular access device 04/13/2021    4 FR Single PICC for LTABX: R cephalic vessell length 48 CM Max P leave @ 1 CM out; Arm circumferenc 40 CM    Hyperlipidemia     Hypothyroidism     Morbid obesity (HCC)     Neuropathy     Obstructive sleep apnea     Rhinitis      Past Surgical History:   Procedure Laterality Date    COLONOSCOPY N/A 12/01/2020    COLONOSCOPY performed by Ian Malkin, MD at [facility] ENDOSCOPY    HX AORTIC VALVE REPLACEMENT      Bovine bioprosthetic    HX PACEMAKER         Personal factors and/or comorbidities impacting plan of care: as above    Home Situation  Home Environment: Private residence  # Steps to Enter: 5  Rails to Enter: Yes  Hand Rails : Right  One/Two Story Residence: One story  Living Alone: No  Support Systems: Child(ren)  Patient Expects to be Discharged to:: Home with home health  Current DME Used/Available at Home: Gilmer Mor, straight, Environmental consultant, rolling, Wheelchair  Tub or Shower Type: Tub/Shower combination    EXAMINATION/PRESENTATION/DECISION MAKING:   Critical Behavior:  Neurologic State: Alert  Orientation Level: Oriented X4  Cognition: Appropriate decision making, Appropriate for age  attention/concentration, Appropriate safety awareness  Safety/Judgement: Awareness of environment  Hearing:  Auditory  Auditory Impairment: None  Skin:  LE exposed skin intact    Range Of Motion:            Generally decreased, functional LEs              Strength:           Generally decreased, functional LEs              Tone & Sensation:          Normal LE tone  Coordination:   WFL    Functional Mobility:  Bed Mobility:   States performing without assist but with head of bed elevated + rails. Encouraged gradual return to performance with flat HOB and no rails as  she will perform at home.           Transfers:  Sit to Stand: Stand-by assistance  Stand to Sit: Stand-by assistance                       Balance:   Sitting: Intact  Standing: With support  Standing - Static: Good  Standing - Dynamic : Fair  Ambulation/Gait Training:  Distance (ft): 5 Feet (ft) + 8' forward and back  Assistive Device: Walker, rolling;Cane, straight  Ambulation - Level of Assistance: Contact guard assistance        Gait Abnormalities: Decreased step clearance        Base of Support: Widened     Speed/Cadence: Pace decreased (<100 feet/min)  Step Length: Right shortened;Left shortened                  No loss of balance.         Pt performs mobility for commode to chair, sit to stand training 3 x 3 with education and cues for breathing techniques, and ambulation forward and back using RW with cues for breathing techniques. Extended rest breaks between each activity.        Therapeutic Exercises:   Reviewed LE ankle pumps and LAQ with pt affirming performance throughout the day.    Functional Measure:  [facility] [employer] [facility] [employer] [facility]      Basic Mobility Inpatient Short Form (6-Clicks) Version 2  How much HELP from another person do you currently need... (If the patient hasn't done an activity recently, how much help from another person do you think they would need if they tried?) Total A Lot A Little None   1.  Turning from  your back to your side while in a flat bed without using bedrails? []   1 []   2 [x]   3  []   4   2.  Moving from lying on your back to sitting on the side of a flat bed without using bedrails? []   1 []   2 [x]   3  []   4   3.  Moving to and from a bed to a chair (including a wheelchair)? []   1 []   2 [x]   3  []   4   4. Standing up from a chair using your arms (e.g. wheelchair or bedside chair)? []   1 []   2 [x]   3  []   4   5.  Walking in hospital room? [x]   1 []   2 []   3  []   4   6.  Climbing 3-5 steps with a railing? [x]   1 []   2 []   3  []   4     Raw Score: 14/24                            Cutoff score ?171,2,3 had higher odds of discharging home with home health or need of SNF/IPR.    1. Emelia Loroniane U. Jette, Janeece RiggersMary Stilphen, Vinoth Fransico MeadowK. Ranganathan, Lupe CarneySandra D. Passek, Thornton DalesFrederick S. Cassandria AngerFrost, Alan M. Jette.  Validity of the AM-PAC "6-Clicks" Inpatient Daily Activity and Basic Mobility Short Forms. Physical Therapy Mar 2014, [phone]; DOI: 10.2522/ptj.[id number]  2. Lesli AlbeeWarren M, Knecht J,  Thyra Breed Association of AM-PAC "6-Clicks" Basic Mobility and Daily Activity Scores With Discharge Destination. Phys Ther. 2021 Apr 4;101(4):pzab043. doi: 10.1093/ptj/pzab043. PMID: 49675916.  3. Herbold J, Rajaraman D, Lubertha Basque, Agayby K, Halsey S. Activity Measure for Post-Acute Care "6-Clicks" Basic Mobility Scores Predict Discharge Destination After Acute Care Hospitalization in Select Patient Groups: A Retrospective, Observational Study. Arch Rehabil Res Clin Transl. 2022 Jul 16;4(3):100204. doi: [email or web address]/j.[email or web address].[id number]. PMID: 70177939; PMCID: [id number].  4. Josefina Do, Coster W, Ni P. AM-PAC Short Forms Manual 4.0. Revised 02/2018.        Pain Rating:  No pain complaints    Activity Tolerance:   requires rest breaks    After treatment patient left in no apparent distress:   Sitting in chair and Call bell within reach    COMMUNICATION/EDUCATION:   The patient's plan of care was discussed with: Registered nurse.   Pt  educated regarding safety precautions including proper footwear and need to contact staff for assistance with all mobility. Pt verbalizes understanding.      Fall prevention education was provided and the patient/caregiver indicated understanding., Patient/family have participated as able in goal setting and plan of care., and Patient/family agree to work toward stated goals and plan of care.    Thank you for this referral.  Margrett Rud, PT, DPT   Time Calculation: 32 mins

## 2021-05-25 NOTE — Consults (Signed)
Consults by Thurston Pounds, MD at 05/25/21 1622                Author: Thurston Pounds, MD  Service: Clinical Cardiac Electrophysiology  Author Type: Physician       Filed: 05/25/21 1641  Date of Service: 05/25/21 1622  Status: Addendum          Editor: Thurston Pounds, MD (Physician)          Related Notes: Original Note by Thurston Pounds, MD (Physician) filed at 05/25/21 1638                 Cardiac Electrophysiology Hospital Consult/Initial Visit Note           Subjective:         Deborah [name] is a 66 y.o. patient who is seen for evaluation of PVCs in bigeminy pattern   She is admitted with diastolic CHF and feel fluid/volume overloaded   She wants more days in hospital for diuresis      I had seen her once in April 2022 and she had 30,000 unifocal PVCs then on holter   Stress test no ischemia in 2022   She has [employer] dual chamber pacemaker with hx of vegetation on lead and was treated with antibiotic   She had AVR in [place] and pacer implanted there for presumable av block   She moved to [facility] [employer] [facility]     She changed practice to [facility] office.          ECHO ADULT COMPLETE 05/22/2021 05/22/2021      Interpretation Summary   ?  Contrast used: Definity.   ?  Technical qualifiers: Echo study was technically difficult with poor endocardial visualization, limited due to patient's condition, limited  due to patient tolerance and technically difficult due to patient's heart rhythm.   ?  Left Ventricle: Normal left ventricular systolic function with a visually estimated EF of 60 - 65%. Left ventricle size is normal. Mildly  increased wall thickness. Unable to assess wall motion.   ?  Aortic Valve: Not well visualized. Bioprosthetic valve. AV mean gradient is 34 mmHg. LVOT:AV VTI Index is 0.38.  With a mean PG 34 mmHg,  DVI 0.38 and an intermediate jet counter, there is possibly some valve stenosis but likely not significant.      Signed by: Laddie Aquas, MD on 05/22/2021 11:13 PM               Allergies        Allergen   Reactions         ?  Nitroglycerin  Unknown (comments)             hypotension         ?  Aloe Vera  Rash     ?  Hydrochlorothiazide  Other (comments)             Reports 'kidneys dry up"          ?  Tetanus And Diphther. Tox (Pf)  Swelling             Swelling of arm and it turns black          No current facility-administered medications on file prior to encounter.          Current Outpatient Medications on File Prior to Encounter          Medication  Sig  Dispense  Refill           ?  bumetanide (BUMEX) 1 mg tablet  Take 1 Tablet by mouth daily.  30 Tablet  0           ?  lidocaine 4 % patch  Apply to low back   Apply patch to the affected area for 12 hours a day and remove for 12 hours a day.  4 Patch  0           ?  arformoteroL (BROVANA) 15 mcg/2 mL nebu neb solution  2 mL by Nebulization route two (2) times a day.  120 mL  0     ?  miconazole (MICOTIN) 2 % topical powder  Apply  to affected area two (2) times a day. APPLY TO bilateral breasts      Nursing, document site in comments  1 g  0     ?  atorvastatin (LIPITOR) 10 mg tablet  Take 1 Tablet by mouth daily.         ?  metoprolol succinate (TOPROL-XL) 25 mg XL tablet  Take 1 Tablet by mouth daily.  60 Tablet  2     ?  levothyroxine (SYNTHROID) 150 mcg tablet  Take 1 Tablet by mouth Daily (before breakfast).  30 Tablet  1     ?  aspirin 81 mg chewable tablet  Take 1 Tablet by mouth daily.  30 Tablet  0     ?  acetaminophen (TYLENOL) 500 mg tablet  Take 2 Tablets by mouth every six (6) hours as needed for Pain.         ?  albuterol (PROVENTIL HFA, VENTOLIN HFA, PROAIR HFA) 90 mcg/actuation inhaler  Take 2 Puffs by inhalation every six (6) hours as needed.         ?  citalopram (CELEXA) 40 mg tablet  Take 1 Tablet by mouth daily.         ?  fluticasone propionate (FLONASE) 50 mcg/actuation nasal spray  2 Sprays by Nasal route daily as needed.         ?  sucralfate (CARAFATE) 1 gram tablet  Take 1 Tablet by mouth three (3) times daily.         ?  traZODone  (DESYREL) 100 mg tablet  Take 1 Tablet by mouth nightly.         ?  glimepiride (AMARYL) 2 mg tablet  Take 1 Tablet by mouth two (2) times a day.         ?  montelukast (SINGULAIR) 10 mg tablet  Take 1 Tablet by mouth nightly.               ?  naloxone (Narcan) 4 mg/actuation nasal spray  Use 1 spray intranasally, then discard. Repeat with new spray every 2 min as needed for opioid overdose symptoms, alternating nostrils.  2 Each  0          Current Facility-Administered Medications          Medication  Dose  Route  Frequency           ?  bumetanide (BUMEX) injection 1 mg   1 mg  IntraVENous  BID     ?  hydrocortisone (CORTEF) tablet 20 mg   20 mg  Oral  BID WITH MEALS           ?  PHENYLephrine (NEO-SYNEPHRINE) 50 mg in 0.9% sodium chloride 250 mL (Vial2Bag)   10-100 mcg/min  IntraVENous  TITRATE           ?  midodrine (PROAMATINE) tablet 10 mg   10 mg  Oral  TID WITH MEALS     ?  amiodarone (CORDARONE) tablet 400 mg   400 mg  Oral  BID     ?  meropenem (MERREM) 1 g in 0.9% sodium chloride (MBP/ADV) 50 mL MBP   1 g  IntraVENous  Q8H     ?  L.acidophilus-paracasei-S.thermophil-bifidobacter (RISAQUAD) 8 billion cell capsule   1 Capsule  Oral  DAILY     ?  albuterol-ipratropium (DUO-NEB) 2.5 MG-0.5 MG/3 ML   3 mL  Nebulization  Q6H RT     ?  pantoprazole (PROTONIX) tablet 40 mg   40 mg  Oral  ACB     ?  heparin (porcine) injection 5,000 Units   5,000 Units  SubCUTAneous  Q8H     ?  [Held by provider] metoprolol succinate (TOPROL-XL) XL tablet 25 mg   25 mg  Oral  DAILY     ?  [Held by provider] acetaZOLAMIDE (DIAMOX) tablet 250 mg   250 mg  Oral  BID     ?  nystatin (MYCOSTATIN) 100,000 unit/gram cream     Topical  BID     ?  traZODone (DESYREL) tablet 100 mg   100 mg  Oral  QHS     ?  oxyCODONE IR (ROXICODONE) tablet 5 mg   5 mg  Oral  Q6H PRN     ?  albuterol-ipratropium (DUO-NEB) 2.5 MG-0.5 MG/3 ML   3 mL  Nebulization  Q4H PRN     ?  insulin lispro (HUMALOG) injection     SubCUTAneous  AC&HS     ?  glucose  chewable tablet 16 g   4 Tablet  Oral  PRN           ?  glucagon (GLUCAGEN) injection 1 mg   1 mg  IntraMUSCular  PRN           ?  dextrose 10% infusion 0-250 mL   0-250 mL  IntraVENous  PRN     ?  [Held by provider] arformoteroL (BROVANA) neb solution 15 mcg   15 mcg  Nebulization  BID RT     ?  budesonide (PULMICORT) 500 mcg/2 ml nebulizer suspension   500 mcg  Nebulization  BID RT     ?  sodium chloride (NS) flush 5-40 mL   5-40 mL  IntraVENous  Q8H     ?  sodium chloride (NS) flush 5-40 mL   5-40 mL  IntraVENous  PRN     ?  acetaminophen (TYLENOL) tablet 650 mg   650 mg  Oral  Q6H PRN          Or           ?  acetaminophen (TYLENOL) suppository 650 mg   650 mg  Rectal  Q6H PRN     ?  polyethylene glycol (MIRALAX) packet 17 g   17 g  Oral  DAILY PRN     ?  senna (SENOKOT) tablet 8.6 mg   1 Tablet  Oral  DAILY PRN     ?  promethazine (PHENERGAN) tablet 12.5 mg   12.5 mg  Oral  Q6H PRN          Or           ?  ondansetron (ZOFRAN) injection 4 mg   4 mg  IntraVENous  Q6H PRN     ?  aspirin chewable tablet 81  mg   81 mg  Oral  DAILY     ?  atorvastatin (LIPITOR) tablet 10 mg   10 mg  Oral  DAILY     ?  citalopram (CELEXA) tablet 40 mg   40 mg  Oral  DAILY     ?  [Held by provider] glipiZIDE (GLUCOTROL) tablet 5 mg   5 mg  Oral  BID WITH MEALS     ?  levothyroxine (SYNTHROID) tablet 150 mcg   150 mcg  Oral  ACB     ?  montelukast (SINGULAIR) tablet 10 mg   10 mg  Oral  QHS           ?  sucralfate (CARAFATE) tablet 1 g   1 g  Oral  TID                          Past Medical History:        Diagnosis  Date         ?  Anxiety and depression        ?  Aortic valve replaced        ?  Asthma        ?  Chronic narcotic use        ?  Chronic pain        ?  CKD (chronic kidney disease), stage III (HCC)        ?  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  GERD (gastroesophageal reflux disease)        ?  Hyperlipidemia        ?  Hypothyroid        ?  Morbid obesity (HCC)        ?  Neuropathy        ?  Pacemaker            ?   Rhinitis            surgical history AVR   No pacer in family history.     Social History                           Tobacco Use         ?  Smoking status:  Former Smoker                Packs/day:  1.00           Years:  20.00           Pack years:  20.00           Quit date:  03/21/2010           Years since quitting:  10.1         ?  Smokeless tobacco:  Never Used       Substance Use Topics         ?  Alcohol use:  Not Currently             Review of Systems:    Constitutional: Negative for fever, chills, weight loss, + malaise/fatigue.    HEENT: Negative for nosebleeds, vision changes.    Respiratory: Negative for cough, hemoptysis   Cardiovascular: Negative for chest pain, palpitations, orthopnea, claudication, + leg swelling, no syncope, and PND.    Gastrointestinal: Negative for nausea, vomiting, diarrhea, blood in stool and melena.  Genitourinary: Negative for dysuria, and hematuria.    Musculoskeletal: Negative for myalgias, + arthralgia.    Skin: Negative for rash.    Heme: Does not bleed or bruise easily.    Neurological: Negative for speech change           Objective:      Visit Vitals      BP  95/70 (BP 1 Location: Left lower arm, BP Patient Position: Sitting)     Pulse  87     Temp  98.4 F (36.9 C)     Resp  17     Ht  5\' 7"  (1.702 m)     Wt  340 lb 9.6 oz (154.5 kg)     SpO2  97%        BMI  53.35 kg/m             Physical Exam:    Constitutional: well-developed and well-nourished. No respiratory distress.    Head: Normocephalic and atraumatic.    Eyes: Pupils are equal, round   ENT: hearing normal   Neck: supple. No JVD present.    Cardiovascular: Normal rate, irregular rhythm. No murmur heard.   Pulmonary/Chest: Effort normal and breath sounds normal. No wheezes.    Abdominal: Soft, no tenderness. Morbidly obese  Musculoskeletal: 3+ leg edema.   Neurological: alert,oriented.    Skin: Skin is warm and dry sternal old scar and left chest pacer pocket   Psychiatric: normal mood and affect. Behavior  is normal. Judgment and thought content normal.               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     Her PVC does not respond to amiodarone    May add mexiletine for now   She wants to proceed with PVC ablation   I told her risks and benefits and [facility]   I will schedule but not sure once anesthesiologist meets her, she can be a candidate   She wants me to go ahead and get her on the schedule in case amiodarone and mexiletine do not suppress her PVC enough   She is also on toprol but low Bp cannot tolerate more      UTI- ESBL-   Escherichia coli ** (EXTENDED SPECTRUM BETA LACTAMASE PRODUCER) ** Abnormal     Treated with Meropenem to 4/29    She also see Dr Germaine PomfretGentz, ID for a 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  Infection  was thought to be due to left great toe wound. cardiology previously had no plans for device removal. In light of prosthetic valve patient was treated with 6-week course of IV ceftriaxone which  was scheduled to be completed on 05/23/2021.  Gentamicin not administered due to CKD   She cannot go for PVC ablation now with this either (beside acute  on chronic diastolic CHF)            Juliet Rude, M.D. [facility]   Electrophysiology/Cardiology   [facility]   [address] [place], [place] [address]                               [phone]

## 2021-05-25 NOTE — Progress Notes (Signed)
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  Infection was thought to be due to left great toe wound. Per cardiology no plans for device removal.  Cardiology recommended course of antibiotics with repeat imaging once antibiotics are complete. In light of prosthetic valve patient was treated with 6-week course of IV ceftriaxone which was scheduled to be completed on 05/23/2021.  Gentamicin not administered due to CKD.  Further antibiotics as outlined below.  Cardiology following at this admission.  Defer further imaging plans to cardiology team  ESBL E. coli UTI.  Continue meropenem through 4/29  Hypotension.  Vasopressors weaned off today.   Doubt due to ongoing infection.  Continue meropenem through 4/29 then monitor off antibiotics  Acute hypoxic hypercapnic respiratory failure.  Suspect pulmonary edema.  Pulmonary following   History of bioprosthetic aortic valve replacement and pacemaker.   SVT.  On amiodarone.              Subjective:     No new complaints.  Off vasopressors today    Objective:     Vitals: Visit Vitals  BP (!) 99/51 (BP 1 Location: Left lower arm, BP Patient Position: Sitting)   Pulse 84   Temp 98.2 F (36.8 C)   Resp 16   Ht 5\' 7"  (1.702 m)   Wt 340 lb 9.6 oz (154.5 kg)   SpO2 96%   BMI 53.35 kg/m          Tmax:  Temp (24hrs), Avg:98.4 F (36.9 C), Min:97.5 F (36.4 C), Max:99.2 F (37.3 C)      Exam:   Patient is intubated:  no    Physical Examination:   General:  Alert, cooperative, no distress   Head:  Normocephalic, atraumatic.   Eyes:  Conjunctivae clear   Neck: Supple       Lungs:   No distress.   Chest wall:     Heart:     Abdomen:   Obese, non-distended   Extremities: Moves all.     Skin: No acute rash on exposed skin   Neurologic: CNII-XII intact. Normal strength     Labs:        No lab exists  for component: ITNL   No results for input(s): CPK, CKMB, TROIQ in the last 72 hours.  Recent Labs     05/24/21  0312 05/23/21  0338   NA 139 139   K 4.2 3.8   CL 107 106   CO2 33* 33*   BUN 23* 26*   CREA 1.13* 1.08*   GLU 117* 122*   PHOS 3.2 3.3   MG 2.2 2.2   WBC 7.8 6.9   HGB 9.2* 9.0*   HCT 31.2* 30.9*   PLT 110* 90*       No results for input(s): INR, PTP, APTT, INREXT, INREXT in the last 72 hours.  Needs: urine analysis, urine sodium, protein and creatinine  Lab Results   Component Value Date/Time    Sodium,urine random 81 11/27/2020 07:14 PM    Creatinine, urine random 27.00 11/28/2020 04:49 PM         Cultures:     No results found for: SDES  Lab Results   Component Value Date/Time  Culture result: (A) 05/17/2021 02:58 PM     Escherichia coli ** (EXTENDED SPECTRUM BETA LACTAMASE PRODUCER) **    Culture result: MIXED UROGENITAL FLORA ISOLATED 05/17/2021 02:58 PM    Culture result: NO GROWTH 5 DAYS 05/17/2021 10:12 AM       Radiology:     Medications       Current Facility-Administered Medications   Medication Dose Route Frequency Last Admin    hydrocortisone (CORTEF) tablet 20 mg  20 mg Oral BID WITH MEALS 20 mg at 05/25/21 0845    PHENYLephrine (NEO-SYNEPHRINE) 50 mg in 0.9% sodium chloride 250 mL (Vial2Bag)  10-100 mcg/min IntraVENous TITRATE Stopped at 05/25/21 0847    midodrine (PROAMATINE) tablet 10 mg  10 mg Oral TID WITH MEALS 10 mg at 05/25/21 0845    amiodarone (CORDARONE) tablet 400 mg  400 mg Oral BID 400 mg at 05/25/21 0845    [Held by provider] bumetanide (BUMEX) tablet 1 mg  1 mg Oral DAILY 1 mg at 05/23/21 0819    meropenem (MERREM) 1 g in 0.9% sodium chloride (MBP/ADV) 50 mL MBP  1 g IntraVENous Q8H 1 g at 05/25/21 0308    L.acidophilus-paracasei-S.thermophil-bifidobacter (RISAQUAD) 8 billion cell capsule  1 Capsule Oral DAILY 1 Capsule at 05/25/21 0845    albuterol-ipratropium (DUO-NEB) 2.5 MG-0.5 MG/3 ML  3 mL Nebulization Q6H RT 3 mL at 05/25/21 0732    pantoprazole (PROTONIX) tablet 40  mg  40 mg Oral ACB 40 mg at 05/25/21 0622    heparin (porcine) injection 5,000 Units  5,000 Units SubCUTAneous Q8H 5,000 Units at 05/25/21 16100621    [Held by provider] metoprolol succinate (TOPROL-XL) XL tablet 25 mg  25 mg Oral DAILY 25 mg at 05/16/21 0847    [Held by provider] acetaZOLAMIDE (DIAMOX) tablet 250 mg  250 mg Oral BID 250 mg at 05/23/21 0819    nystatin (MYCOSTATIN) 100,000 unit/gram cream   Topical BID Given at 05/25/21 0846    traZODone (DESYREL) tablet 100 mg  100 mg Oral QHS 100 mg at 05/24/21 2232    oxyCODONE IR (ROXICODONE) tablet 5 mg  5 mg Oral Q6H PRN 5 mg at 05/24/21 2335    albuterol-ipratropium (DUO-NEB) 2.5 MG-0.5 MG/3 ML  3 mL Nebulization Q4H PRN 3 mL at 05/15/21 2003    insulin lispro (HUMALOG) injection   SubCUTAneous AC&HS 3 Units at 05/24/21 2232    glucose chewable tablet 16 g  4 Tablet Oral PRN      glucagon (GLUCAGEN) injection 1 mg  1 mg IntraMUSCular PRN      dextrose 10% infusion 0-250 mL  0-250 mL IntraVENous PRN      [Held by provider] arformoteroL (BROVANA) neb solution 15 mcg  15 mcg Nebulization BID RT 15 mcg at 05/17/21 0745    budesonide (PULMICORT) 500 mcg/2 ml nebulizer suspension  500 mcg Nebulization BID RT 500 mcg at 05/25/21 0737    sodium chloride (NS) flush 5-40 mL  5-40 mL IntraVENous Q8H 10 mL at 05/24/21 2232    sodium chloride (NS) flush 5-40 mL  5-40 mL IntraVENous PRN      acetaminophen (TYLENOL) tablet 650 mg  650 mg Oral Q6H PRN 650 mg at 05/23/21 1401    Or    acetaminophen (TYLENOL) suppository 650 mg  650 mg Rectal Q6H PRN      polyethylene glycol (MIRALAX) packet 17 g  17 g Oral DAILY PRN      senna (SENOKOT) tablet 8.6 mg  1 Tablet  Oral DAILY PRN 8.6 mg at 05/19/21 1700    promethazine (PHENERGAN) tablet 12.5 mg  12.5 mg Oral Q6H PRN      Or    ondansetron (ZOFRAN) injection 4 mg  4 mg IntraVENous Q6H PRN 4 mg at 05/17/21 0956    aspirin chewable tablet 81 mg  81 mg Oral DAILY 81 mg at 05/25/21 0845    atorvastatin (LIPITOR) tablet 10 mg  10 mg Oral  DAILY 10 mg at 05/25/21 0845    citalopram (CELEXA) tablet 40 mg  40 mg Oral DAILY 40 mg at 05/25/21 0845    [Held by provider] glipiZIDE (GLUCOTROL) tablet 5 mg  5 mg Oral BID WITH MEALS 5 mg at 05/17/21 0805    levothyroxine (SYNTHROID) tablet 150 mcg  150 mcg Oral ACB 150 mcg at 05/25/21 0622    montelukast (SINGULAIR) tablet 10 mg  10 mg Oral QHS 10 mg at 05/24/21 2232    sucralfate (CARAFATE) tablet 1 g  1 g Oral TID 1 g at 05/25/21 0845           Case discussed with:      [name], DO

## 2021-05-25 NOTE — Progress Notes (Signed)
0700  Bedside and Verbal shift change report given to Charlies Constable RN (oncoming nurse) by Leotis Shames RN (offgoing nurse). Report included the following information SBAR, Kardex, Intake/Output, MAR, Accordion, Recent Results, and Cardiac Rhythm apaced .    Initial assessment done. AOX4. No complaints of pain. Will continue to monitor.      This patient was assisted with Intentional Toileting every 2 hours during this shift as appropriate.  Documentation of ambulation and output reflected on Flowsheet as appropriate.  Purposeful hourly rounding was completed using AIDET and 5Ps.  Outcomes of PHR documented as they occurred. Bed alarm in use as appropriate.  Dual Suction and ambubag in place.    Visit Vitals  BP 100/76 (BP 1 Location: Left upper arm, BP Patient Position: Sitting)   Pulse 87   Temp 98.4 F (36.9 C)   Resp 17   Ht 5\' 7"  (1.702 m)   Wt 154.5 kg (340 lb 9.6 oz)   SpO2 97%   BMI 53.35 kg/m     1900  Bedside and Verbal shift change report given to (oncoming nurse) by [employer] RN (offgoing nurse). Report included the following information SBAR, Kardex, Intake/Output, MAR, Accordion, and Recent Results.

## 2021-05-25 NOTE — Progress Notes (Signed)
Progress  Notes by Laddie Aquas, MD at 05/25/21 [phone]                Author: Laddie Aquas, MD  Service: Cardiology  Author Type: Physician       Filed: 05/25/21 1255  Date of Service: 05/25/21 0747  Status: Addendum          Editor: Laddie Aquas, MD (Physician)          Related Notes: Original Note by Vincent Peyer, NP (Nurse Practitioner) filed at 05/25/21  1040                                                                                     [place] [facility]                     Cardiology Care Note       [] Initial Encounter      [x] Follow-up        Patient Name: Deborah [name] - DOB:1955-06-19 - RUE:[id number]   Primary Cardiologist: [facility] Physicians: Thurston Pounds MD   Consulting Cardiologist: [facility] Physicians: Dr Welton Flakes         Reason for encounter: CHF       HPI:         Deborah [name] is a 66 y.o. female with PMH of HF p EF, a fib, possible atrial lead vegetation treated abx, repeat ECHO with no evidence of vegetation with COPD, A/C resp failure who was recently discharged from hospital on  04/24/2021 to [facility] for rehab.       She says that she had not been receiving her Bumex on a consistent basis and missed several days dosing.       She started having a progressively worsening SOB associated with lower extremity swelling. She says she was using oxygen but required supplementation in the ED. She denies any cough or fever. No chills, nausea or vomiting.      Subjective:        Deborah [name] reports breathing is ok. Remains on neo @ 15 mcg          Assessment and Plan          1 A/C HF p EF: seems as if her diuretics were not being administered at SNF. Holding diuretics  d/t low BP. Repeat TTE w/ EF 60-65%      2 Hx a fib s/p PPM (not MRI compatible)/ s/p AVR: Has had bigeminy and intermittent episodes of SVT with Pacer tracking at 150 bpm - started  on PO amio, ectopy improved. BB held due to hypotension . Not previously anticoagulated (thrombocytopenia).  No  significant AV stenosis on repeat TTE   - EP to see re PVC recommendations       3 Bacteremia due to group B Streptococcus: during recent admission, she had GBS bacteremia complicated by pacemaker line vegetation. Continue IV Ceftriaxone completed 05/23/21.       4 Acute on chronic respiratory failure with hypoxia and hypercapnia/COPD: On NC        5 Hypotension: increased midodrine, unclear cause, normal EF on TTE. Agree w/ holding  bumex/diamox. Hydrocortisone started per hospitalist. Cont to wean neo  to MAP > 60        6 T 2 DM       7 CKD : stable       8 Obesity: Body mass index is 53.35 kg/m.       9 Thrombocytopenia             Will see prn over the weekend.                    CARDIOLOGY ATTENDING   Patient personally seen and examined. All the elements of history and examination were personally performed. Assessment and plan was discussed and agree.         Feels more swollen off of her diuretics    Obese, NAD, Hrt RRR, 3/6 systolic murmur RSB, + obese, + LE edema        Tele - atrial paced with lots of PVC (even in bigeminy)            1) Acute on chronic HFpEF   - p/w SOB, volume overload 04/08/21    - Echo 05/22/21 - LVEF 60%   - diuretics held a couple of days for hypotension, but BP remained low and patient now complaining of feeling volume overloaded ---> Will try Bumex 1 mg IV BID and follow BP closely (D/w Dr. Drucilla Schmidt)         2) s/p AVR  - Echo 04/17/21 -  Aortic Valve: Not well visualized. Edwards bovine bioprosthetic valve with a size of 25 mm. Mild regurgitation. Stenosis of the aortic valve. AV mean gradient is 38 mmHg. AV area by continuity VTI is 1.0 cm2   - Echo 05/22/21 - shows some AVR stenosis, but likely not significant        3) History of Afib s/p PPM    - BB held due to hypotension .    - Not previously anticoagulated (thrombocytopenia).         4) Bacteremia due to group B Streptococcus: during recent admission, she had GBS bacteremia complicated by pacemaker line vegetation. Continue IV  Ceftriaxone scheduled to run through 05/23/21.       5) Acute on chronic respiratory failure with hypoxia and hypercapnia/COPD:        6) Hypotension   - Echo 05/22/21 with LVEF 60-65%   - Trying midodrine and hydrocortisone  to decrease use of pressors - however BP remains low        7) Frequent PVC's    - has frequent had PVC's for years     - Holter 08/2020 > 30 thousands PVC, 10 beat NSVT, brief PAT   - cardiolite OK 1/23    - Tele 05/25/21 shows PVC's in bigeminy    ---> I suspect frequent PVC's could be lowering cardiac output.  I started amiodarone load (unable to take BB due to hypotension) a few days ago but PVC's remain frequent.  ----> Have texted  Dr. Loma Newton on further advice on PVC management        Tharon Aquas, MD, [facility]                High complexity decision making was performed       Patient is at high-risk of decompensation with multiple  organ involvement               ____________________________________________________________      Cardiac testing   05/09/21  ECHO ADULT COMPLETE 05/22/2021 05/22/2021      Interpretation Summary   ?  Contrast used: Definity.   ?  Technical qualifiers: Echo study was technically difficult with poor endocardial visualization, limited due to patient's condition, limited  due to patient tolerance and technically difficult due to patient's heart rhythm.   ?  Left Ventricle: Normal left ventricular systolic function with a visually estimated EF of 60 - 65%. Left ventricle size is normal. Mildly  increased wall thickness. Unable to assess wall motion.   ?  Aortic Valve: Not well visualized. Bioprosthetic valve. AV mean gradient is 34 mmHg. LVOT:AV VTI Index is 0.38.  With a mean PG 34 mmHg,  DVI 0.38 and an intermediate jet counter, there is possibly some valve stenosis but likely not significant.      Signed by: Laddie Aquas, MD on 05/22/2021 11:13 PM         04/08/21      ECHO ADULT FOLLOW-UP OR LIMITED 04/17/2021 04/17/2021      Interpretation Summary   ?  Aortic  Valve: Not well visualized. Edwards bovine bioprosthetic valve with a size of 25 mm. Mild regurgitation.  Stenosis of the aortic valve. AV mean gradient is 38 mmHg. AV area by continuity VTI is 1.0 cm2.   ?  Technical qualifiers: Echo study was technically difficult due to patient's body habitus.      Signed by: Candyce Churn, DO on 04/17/2021  5:00 PM         02/16/21      NUCLEAR CARDIAC STRESS TEST 02/20/2021 02/26/2021      Interpretation Summary   ?  Nuclear Findings: LV perfusion is normal. There is no evidence of inducible ischemia.   ?  Nuclear Findings: The defect appears to be caused by breast attenuation.   ?  ECG: Resting ECG demonstrates normal sinus rhythm.   ?  ECG: Stress ECG was negative for ischemia.   ?  Stress Test: A pharmacological stress test was performed using lexiscan. Hemodynamics are adequate for diagnosis. Blood pressure demonstrated  a normal response and heart rate demonstrated a normal response to stress. The patient's heart rate recovery was normal. The patient reported dyspnea and no chest pain during the stress test.      Ventricular bigeminy   Unifocal PVC, RBBB and superior axis PVCs      Signed by: Thurston Pounds, MD on 02/20/2021  5:00 PM      Most recent HS troponins:   No results for input(s): TROPHS in the last 72 hours.      No lab exists for component:  CKMB            ECG:      EKG Results                  Procedure  720  Value  Units  Date/Time           EKG, 12 LEAD, INITIAL [[id number]]  Collected: 05/18/21 1916       Order Status: Completed  Updated: 05/19/21 1310                Ventricular Rate  123  BPM           Atrial Rate  57  BPM           QRS Duration  166  ms           Q-T Interval  392  ms  QTC Calculation (Bezet)  561  ms                  Calculated R Axis  -73  degrees           Calculated T Axis  108  degrees                Diagnosis  --             Ventricular-paced rhythm   Abnormal ECG   When compared with ECG of 18-May-2021 01:25,   Vent. rate  has increased BY   5 BPM   Confirmed by Barry Dieneswens, M.D., Jonny RuizJohn ([id number]) on 05/19/2021 1:10:36 PM              EKG, 12 LEAD, INITIAL [[id number]][[id number]]  Collected: 05/18/21 0125       Order Status: Completed  Updated: 05/18/21 1556                Ventricular Rate  118  BPM           Atrial Rate  37  BPM           QRS Duration  172  ms           Q-T Interval  406  ms           QTC Calculation (Bezet)  569  ms           Calculated R Axis  -70  degrees           Calculated T Axis  104  degrees                Diagnosis  --             Ventricular-paced rhythm   Abnormal ECG   Confirmed by Barry Dieneswens, M.D., John ([id number]) on 05/18/2021 3:56:31 PM              EKG, 12 LEAD, INITIAL [[id number]][[id number]]  Collected: 05/17/21 2134       Order Status: Completed  Updated: 05/18/21 1553                Ventricular Rate  107  BPM           Atrial Rate  107  BPM           P-R Interval  232  ms           QRS Duration  170  ms           Q-T Interval  362  ms           QTC Calculation (Bezet)  483  ms           Calculated R Axis  -71  degrees           Calculated T Axis  110  degrees                Diagnosis  --             Atrial-sensed ventricular-paced rhythm with prolonged AV conduction   Abnormal ECG   When compared with ECG of 11-May-2021 20:56,   Electronic ventricular pacemaker has replaced Electronic atrial pacemaker   Confirmed by Barry Dieneswens, M.D., John ([id number]) on 05/18/2021 3:53:20 PM              EKG, 12 LEAD, INITIAL [[id number]][[id number]]  Collected: 05/11/21 2056       Order Status: Completed  Updated: 05/14/21 1706  Ventricular Rate  84  BPM           Atrial Rate  84  BPM           P-R Interval  168  ms           QRS Duration  162  ms           Q-T Interval  388  ms           QTC Calculation (Bezet)  458  ms                  Calculated P Axis  68  degrees                  Calculated R Axis  -102  degrees           Calculated T Axis  11  degrees                Diagnosis  --             Sinus rhythm with frequent atrial-paced complexes and premature     supraventricular complexes in a pattern of bigeminy   Right bundle branch block   Septal infarct , age undetermined   T wave abnormality, consider lateral ischemia   Abnormal ECG      Confirmed by Barry Dienes, M.D., John ([id number]) on 05/14/2021 5:06:24 PM              EKG, 12 LEAD, INITIAL [[id number]]  Collected: 05/11/21 2005       Order Status: Completed  Updated: 05/14/21 1706                Ventricular Rate  116  BPM           Atrial Rate  116  BPM           P-R Interval  200  ms           QRS Duration  190  ms           Q-T Interval  368  ms           QTC Calculation (Bezet)  511  ms           Calculated R Axis  -72  degrees           Calculated T Axis  105  degrees                Diagnosis  --             Atrial-sensed ventricular-paced rhythm   Abnormal ECG   Confirmed by Barry Dienes, M.D., John ([id number]) on 05/14/2021 5:06:15 PM              EKG, 12 LEAD, INITIAL [[id number]]  Collected: 05/11/21 1531       Order Status: Completed  Updated: 05/14/21 1706                Ventricular Rate  83  BPM           Atrial Rate  83  BPM           P-R Interval  160  ms           QRS Duration  160  ms           Q-T Interval  368  ms           QTC Calculation (Bezet)  432  ms  Calculated P Axis  64  degrees           Calculated R Axis  -100  degrees           Calculated T Axis  16  degrees                Diagnosis  --             Sinus rhythm with frequent atrial-paced complexes and premature    supraventricular complexes in a pattern of bigeminy   Right bundle branch block   Possible Lateral infarct , age undetermined   Abnormal ECG   Confirmed by Barry Dienes, M.D., John ([id number]) on 05/14/2021 5:06:02 PM              EKG 12 LEAD INITIAL [[id number]]  Collected: 05/10/21 0635       Order Status: Completed  Updated: 05/12/21 1304                Ventricular Rate  80  BPM           Atrial Rate  60  BPM           P-R Interval  212  ms           QRS Duration  112  ms           Q-T Interval  526  ms           QTC Calculation (Bezet)  606  ms            Calculated P Axis  61  degrees           Calculated R Axis  -55  degrees           Calculated T Axis  -158  degrees                Diagnosis  --             Atrial-paced rhythm with prolonged AV conduction with frequent premature    ventricular complexes   Incomplete right bundle branch block   Left anterior fascicular block   Septal infarct , age undetermined   T wave abnormality, consider lateral ischemia   Prolonged QT   Abnormal ECG   When compared with ECG of 09-May-2021 20:16,   No significant change      Confirmed by Rathi MD, Vikas ([id number]) on 05/12/2021 1:04:16 PM              EKG, 12 LEAD, INITIAL [[id number]]         Order Status: Sent         EKG 12 LEAD INITIAL [[id number]]  Collected: 05/09/21 2016       Order Status: Completed  Updated: 05/11/21 1349                Ventricular Rate  97  BPM           Atrial Rate  49  BPM           P-R Interval  160  ms           QRS Duration  160  ms           Q-T Interval  350  ms           QTC Calculation (Bezet)  444  ms           Calculated P Axis  67  degrees  Calculated R Axis  -117  degrees           Calculated T Axis  6  degrees                Diagnosis  --             Sinus bradycardia with marked sinus arrhythmia with frequent atrial-paced    complexes and with frequent and consecutive premature ventricular complexes   Right bundle branch block   Possible Lateral infarct , age undetermined   Abnormal ECG   When compared with ECG of 13-Apr-2021 22:14,   premature ventricular complexes are now present   premature atrial complexes are no longer present   Borderline criteria for Lateral infarct are now present   Confirmed by Rathi MD, Vikas [phone]) on 05/11/2021 1:49:45 PM                           Review of Systems:      [x] All other systems reviewed and all negative except as written in HPI      []  Patient unable to provide secondary  to condition        Past Medical History:        Diagnosis  Date         ?  (HFpEF) heart failure with preserved  ejection fraction (HCC)       ?  Anxiety and depression       ?  Aortic valve replaced            S/p bovine aortic valve replacement.         ?  Asthma       ?  Chronic narcotic use       ?  Chronic obstructive pulmonary disease (HCC)       ?  Chronic pain       ?  CKD (chronic kidney disease), stage III (HCC)            Baseline creatinine is 1.3-1.4 with GFR in the 40s.         ?  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  GERD (gastroesophageal reflux disease)       ?  History of vascular access device  04/13/2021          4 FR Single PICC for LTABX: R cephalic vessell length 48 CM Max P leave @ 1 CM out; Arm circumferenc 40 CM         ?  Hyperlipidemia       ?  Hypothyroidism       ?  Morbid obesity (HCC)       ?  Neuropathy       ?  Obstructive sleep apnea           ?  Rhinitis            Past Surgical History:         Procedure  Laterality  Date          ?  COLONOSCOPY  N/A  12/01/2020          COLONOSCOPY performed by 04/15/2021, MD at [facility] ENDOSCOPY          ?  HX AORTIC VALVE REPLACEMENT              Bovine bioprosthetic          ?  HX PACEMAKER            Social Hx:  reports that she quit smoking about 11 years ago. Her smoking use included cigarettes. She has a 40.00 pack-year smoking history. She has never used smokeless tobacco. She reports that she does not currently  use alcohol. She reports that she does not use drugs.   Family Hx: family history includes Hypertension in her father and mother.     Allergies        Allergen  Reactions         ?  Nitroglycerin  Unknown (comments)             hypotension         ?  Aloe Vera  Rash     ?  Hydrochlorothiazide  Other (comments)             Reports 'kidneys dry up"          ?  Tetanus And Diphther. Tox (Pf)  Swelling             Swelling of arm and it turns black              OBJECTIVE:     Wt Readings from Last 3 Encounters:        05/25/21  154.5 kg (340 lb 9.6 oz)     04/24/21  (!) 160.8 kg (354 lb 8 oz)        03/09/21  149.7 kg (330 lb)            Intake/Output Summary (Last 24 hours) at 05/25/2021 0747   Last data filed at 05/25/2021 0305     Gross per 24 hour        Intake  940 ml        Output  750 ml        Net  190 ml                 Physical Exam:      Vitals:      Vitals:             05/25/21 0639  05/25/21 0732  05/25/21 0739  05/25/21 0743           BP:  (!) 109/44      (!) 103/57     Pulse:  60      82     Resp:        14     Temp:        97.5 F (36.4 C)     SpO2:    100%  100%  100%     Weight:                   Height:                Telemetry: SR, paced       Gen: Morbidly obese, in no distress    Neck: Supple, No JVD, No Carotid Bruit   Resp: No accessory muscle use, diminished breath sounds   Card: Regular Rate,Rythm, Normal S1, S2, No murmurs, rubs or gallop.    Abd:   obese, Soft, non-tender, non-distended, BS+    MSK: No cyanosis   Skin: No rashes     Neuro: Moving all four extremities, follows commands appropriately   Psych: Fair insight, oriented to person, place, alert, Nml Affect   LE: 3+ edema      Data Review:  Radiology:    XR Results (most recent):   Results from Hospital Encounter encounter on 05/09/21      XR CHEST PORT      Narrative   EXAM:  XR CHEST PORT      INDICATION: Evaluate bilateral infiltrates      COMPARISON: 05/17/2021      TECHNIQUE: Upright portable chest AP view      FINDINGS: Median sternotomy changes and left subclavian pacemaker with cardiac   monitoring leads. Cardiomegaly. Pulmonary edema unchanged      The lungs and pleural spaces are clear. The visualized bones and upper abdomen   are age-appropriate.      Impression   Pulmonary edema unchanged.           Recent Labs            05/24/21   0312  05/23/21   0338     NA  139  139     K  4.2  3.8     CL  107  106     CO2  33*  33*     BUN  23*  26*     CREA  1.13*  1.08*     GLU  117*  122*     PHOS  3.2  3.3         CA  9.2  9.0             Recent Labs            05/24/21   0312  05/23/21   0338     WBC  7.8  6.9     HGB  9.2*  9.0*     HCT  31.2*  30.9*          PLT  110*  90*           No results for input(s): PTP, INR, AP, INREXT, INREXT in the last 72 hours.      No lab exists for component: PTTP, GPT, SGOT      No results for input(s): CHOL, LDLC in the last 72 hours.      No lab exists for component: TGL, HDLC,  HBA1C         Current meds:      Current Facility-Administered Medications:    ?  hydrocortisone (CORTEF) tablet 20 mg, 20 mg, Oral, BID WITH MEALS, Do, Khoi B, MD, 20 mg at 05/24/21 1627   ?  PHENYLephrine (NEO-SYNEPHRINE) 50 mg in 0.9% sodium chloride 250 mL (Vial2Bag), 10-100 mcg/min, IntraVENous, TITRATE, Heath, Stephanie A, NP, Last Rate: 4.5 mL/hr at 05/25/21 0621, 15 mcg/min at 05/25/21 1610   ?  midodrine (PROAMATINE) tablet 10 mg, 10 mg, Oral, TID WITH MEALS, Fisher, Georga Hacking, NP, 10 mg at 05/24/21 1627   ?  amiodarone (CORDARONE) tablet 400 mg, 400 mg, Oral, BID, Sherrie Mustache Georga Hacking, NP, 400 mg at 05/24/21 1705   ?  [Held by provider] bumetanide (BUMEX) tablet 1 mg, 1 mg, Oral, DAILY, Laddie Aquas, MD, 1 mg at 05/23/21 0819   ?  meropenem (MERREM) 1 g in 0.9% sodium chloride (MBP/ADV) 50 mL MBP, 1 g, IntraVENous, Q8H, [name], MD, Last Rate: 16.7 mL/hr at 05/25/21 0308, 1 g at 05/25/21 0308   ?  L.acidophilus-paracasei-S.thermophil-bifidobacter (RISAQUAD) 8 billion cell capsule, 1 Capsule, Oral, DAILY, Do, Khoi B, MD, 1 Capsule at 05/24/21 0856   ?  albuterol-ipratropium (DUO-NEB) 2.5 MG-0.5 MG/3 ML, 3 mL, Nebulization,  Q6H RT, Bammes, Nathan O, DO, 3 mL at 05/25/21 0732   ?  pantoprazole (PROTONIX) tablet 40 mg, 40 mg, Oral, ACB, Choudhary, Bhavana, MD, 40 mg at 05/25/21 0622   ?  heparin (porcine) injection 5,000 Units, 5,000 Units, SubCUTAneous, Q8H, Tawanna Sat, MD, 5,000 Units at 05/25/21 1610   ?  [Held by provider] metoprolol succinate (TOPROL-XL) XL tablet 25 mg, 25 mg, Oral, DAILY, Fisher, Georga Hacking, NP, 25 mg at 05/16/21 0847   ?  [Held by provider] acetaZOLAMIDE (DIAMOX) tablet 250 mg, 250 mg, Oral, BID, Melynda Keller, MD,  250 mg at 05/23/21 9604   ?  nystatin (MYCOSTATIN) 100,000 unit/gram cream, , Topical, BID, Marca Ancona C, NP, Given at 05/24/21 1720   ?  traZODone (DESYREL) tablet 100 mg, 100 mg, Oral, QHS, Horald Chestnut, DO, 100 mg at 05/24/21 2232   ?  oxyCODONE IR (ROXICODONE) tablet 5 mg, 5 mg, Oral, Q6H PRN, Horald Chestnut, DO, 5 mg at 05/24/21 2335   ?  albuterol-ipratropium (DUO-NEB) 2.5 MG-0.5 MG/3 ML, 3 mL, Nebulization, Q4H PRN, Horald Chestnut, DO, 3 mL at 05/15/21 2003   ?  insulin lispro (HUMALOG) injection, , SubCUTAneous, AC&HS, Horald Chestnut, DO, 3 Units at 05/24/21 2232   ?  glucose chewable tablet 16 g, 4 Tablet, Oral, PRN, Horald Chestnut, DO   ?  glucagon (GLUCAGEN) injection 1 mg, 1 mg, IntraMUSCular, PRN, Jamil, Arna Medici, DO   ?  dextrose 10% infusion 0-250 mL, 0-250 mL, IntraVENous, PRN, Horald Chestnut, DO   ?  [Held by provider] arformoteroL (BROVANA) neb solution 15 mcg, 15 mcg, Nebulization, BID RT, Jamil, Arna Medici, DO, 15 mcg at 05/17/21 0745   ?  budesonide (PULMICORT) 500 mcg/2 ml nebulizer suspension, 500 mcg, Nebulization, BID RT, Horald Chestnut, DO, 500 mcg at 05/25/21 0737   ?  sodium chloride (NS) flush 5-40 mL, 5-40 mL, IntraVENous, Q8H, Melynda Keller, MD, 10 mL at 05/24/21 2232   ?  sodium chloride (NS) flush 5-40 mL, 5-40 mL, IntraVENous, PRN, Melynda Keller, MD   ?  acetaminophen (TYLENOL) tablet 650 mg, 650 mg, Oral, Q6H PRN, 650 mg at 05/23/21 1401 **OR** acetaminophen (TYLENOL) suppository 650 mg, 650 mg, Rectal, Q6H PRN, Melynda Keller, MD   ?  polyethylene glycol (MIRALAX) packet 17 g, 17 g, Oral, DAILY PRN, Melynda Keller, MD   ?  senna (SENOKOT) tablet 8.6 mg, 1 Tablet, Oral, DAILY PRN, Melynda Keller, MD, 8.6 mg at 05/19/21 1700   ?  promethazine (PHENERGAN) tablet 12.5 mg, 12.5 mg, Oral, Q6H PRN **OR** ondansetron (ZOFRAN) injection 4 mg, 4 mg, IntraVENous, Q6H PRN, Melynda Keller, MD, 4 mg at 05/17/21 0956   ?  aspirin chewable tablet 81 mg, 81 mg, Oral, DAILY, Melynda Keller, MD, 81 mg at  05/24/21 0856   ?  atorvastatin (LIPITOR) tablet 10 mg, 10 mg, Oral, DAILY, Melynda Keller, MD, 10 mg at 05/24/21 0856   ?  citalopram (CELEXA) tablet 40 mg, 40 mg, Oral, DAILY, Melynda Keller, MD, 40 mg at 05/24/21 0856   ?  [Held by provider] glipiZIDE (GLUCOTROL) tablet 5 mg, 5 mg, Oral, BID WITH MEALS, Melynda Keller, MD, 5 mg at 05/17/21 0805   ?  levothyroxine (SYNTHROID) tablet 150 mcg, 150 mcg, Oral, ACB, Melynda Keller, MD, 150 mcg at 05/25/21 5409   ?  montelukast (SINGULAIR) tablet 10 mg, 10 mg, Oral, QHS, Melynda Keller, MD, 10 mg at 05/24/21 2232   ?  sucralfate (CARAFATE) tablet 1 g, 1 g, Oral, TID, Wilber Bihari, MD, 1 g at 05/24/21 2232      Arnoldo Morale, NP      [facility]   Call center: [phone]  [phone]         CC:Barrett, Mariel Craft, NP

## 2021-05-25 NOTE — Progress Notes (Signed)
This patient was assisted with Intentional Toileting every 2 hours during this shift as appropriate.  Documentation of ambulation and output reflected on Flowsheet as appropriate.  Purposeful hourly rounding was completed using AIDET and 5Ps.  Outcomes of PHR documented as they occurred. Bed alarm in use as appropriate.  Dual Suction and ambubag in place.

## 2021-05-25 NOTE — Progress Notes (Signed)
Progress  Notes by Deneise Lever, RN at 05/25/21 2008                Author: Deneise Lever, RN  Service: NURSING  Author Type: Registered Nurse       Filed: 05/26/21 0647  Date of Service: 05/25/21 2008  Status: Signed          Editor: Deneise Lever, RN (Registered Nurse)               [phone] Bedside shift change report given to Ivor Messier RN (oncoming nurse) by Charlies Constable (offgoing nurse). Report included the following  information SBAR, Intake/Output, MAR, Recent Results, and Cardiac Rhythm NSR .        2000 BP is 90/25 will restart neo.      0550 BP 125/65, Neo stopped.       0700 Bedside shift change report given to Mardene Celeste RN (oncoming nurse) by Nechama Guard (offgoing nurse). Report included the following information SBAR,  Intake/Output, MAR, Recent Results, and Cardiac Rhythm NSR .        This patient was assisted with Intentional Toileting every 2 hours during this shift as appropriate.  Documentation of ambulation and output reflected on Flowsheet as appropriate.   Purposeful hourly rounding was completed using AIDET and 5Ps.  Outcomes of PHR documented as they occurred. Bed alarm in use as appropriate.  Dual Suction and ambubag in place.          Required Central Line Documentation:      Central line Type: PICC      Central Line Insert Date: 04/13/21      Reason Central Line Placed: long term antibiotic use      Central Line Dressing Date: 05/21/21      Biopatch in place? Yes No: yes      Tubing labeled and appropriate? Yes No: yes      Alcohol caps on all open ports? Yes No: yes      Last CHG bath (time&date): 05/25/21 1100      Reviewed with provider and central line must stay in for the following reasons:

## 2021-05-25 NOTE — Progress Notes (Signed)
Problem: Pressure Injury - Risk of  Goal: *Prevention of pressure injury  Description: Document Braden Scale and appropriate interventions in the flowsheet.  Outcome: Progressing Towards Goal  Note: Pressure Injury Interventions:  Sensory Interventions: Assess changes in LOC, Keep linens dry and wrinkle-free, Minimize linen layers    Moisture Interventions: Absorbent underpads, Check for incontinence Q2 hours and as needed, Maintain skin hydration (lotion/cream)    Activity Interventions: Increase time out of bed, Pressure redistribution bed/mattress(bed type), PT/OT evaluation    Mobility Interventions: Pressure redistribution bed/mattress (bed type), PT/OT evaluation, Turn and reposition approx. every two hours(pillow and wedges)    Nutrition Interventions: Document food/fluid/supplement intake, Discuss nutritional consult with provider    Friction and Shear Interventions: Apply protective barrier, creams and emollients, Lift team/patient mobility team                Problem: Patient Education: Go to Patient Education Activity  Goal: Patient/Family Education  Outcome: Progressing Towards Goal     Problem: Falls - Risk of  Goal: *Absence of Falls  Description: Document Bridgette Habermann Fall Risk and appropriate interventions in the flowsheet.  Outcome: Progressing Towards Goal  Note: Fall Risk Interventions:  Mobility Interventions: Assess mobility with egress test, Bed/chair exit alarm, Communicate number of staff needed for ambulation/transfer, OT consult for ADLs, Patient to call before getting OOB, PT Consult for mobility concerns, PT Consult for assist device competence, Strengthening exercises (ROM-active/passive), Utilize walker, cane, or other assistive device, Utilize gait belt for transfers/ambulation         Medication Interventions: Bed/chair exit alarm, Evaluate medications/consider consulting pharmacy, Patient to call before getting OOB, Teach patient to arise slowly, Utilize gait belt for  transfers/ambulation    Elimination Interventions: Bed/chair exit alarm, Call light in reach, Elevated toilet seat, Patient to call for help with toileting needs, Stay With Me (per policy), Toilet paper/wipes in reach, Toileting schedule/hourly rounds    History of Falls Interventions: Bed/chair exit alarm, Door open when patient unattended, Evaluate medications/consider consulting pharmacy, Room close to nurse's station, Utilize gait belt for transfer/ambulation, Vital signs minimum Q4HRs X 24 hrs (comment for end date)         Problem: Patient Education: Go to Patient Education Activity  Goal: Patient/Family Education  Outcome: Progressing Towards Goal     Problem: Breathing Pattern - Ineffective  Goal: *Absence of hypoxia  Outcome: Progressing Towards Goal  Goal: *Use of effective breathing techniques  Outcome: Progressing Towards Goal     Problem: Patient Education: Go to Patient Education Activity  Goal: Patient/Family Education  Outcome: Progressing Towards Goal

## 2021-05-26 LAB — NT-PRO BNP: NT pro-BNP: 2300 PG/ML — ABNORMAL HIGH (ref <125)

## 2021-05-26 LAB — METABOLIC PANEL, COMPREHENSIVE
A-G Ratio: 0.8 — ABNORMAL LOW (ref 1.1–2.2)
ALT (SGPT): 25 U/L (ref 12–78)
AST (SGOT): 20 U/L (ref 15–37)
Albumin: 2.8 g/dL — ABNORMAL LOW (ref 3.5–5.0)
Alk. phosphatase: 37 U/L — ABNORMAL LOW (ref 45–117)
Anion gap: 1 mmol/L — ABNORMAL LOW (ref 5–15)
BUN/Creatinine ratio: 23 — ABNORMAL HIGH (ref 12–20)
BUN: 24 MG/DL — ABNORMAL HIGH (ref 6–20)
Bilirubin, total: 1.6 MG/DL — ABNORMAL HIGH (ref 0.2–1.0)
CO2: 29 mmol/L (ref 21–32)
Calcium: 8.8 MG/DL (ref 8.5–10.1)
Chloride: 108 mmol/L (ref 97–108)
Creatinine: 1.04 MG/DL — ABNORMAL HIGH (ref 0.55–1.02)
Globulin: 3.3 g/dL (ref 2.0–4.0)
Glucose: 115 mg/dL — ABNORMAL HIGH (ref 65–100)
Potassium: 4.1 mmol/L (ref 3.5–5.1)
Protein, total: 6.1 g/dL — ABNORMAL LOW (ref 6.4–8.2)
Sodium: 138 mmol/L (ref 136–145)
eGFR: >60 mL/min/{1.73_m2} (ref >60)

## 2021-05-26 LAB — GLUCOSE, POC
Glucose (POC): 247 mg/dL — ABNORMAL HIGH (ref 65–117)
Glucose (POC): 112 mg/dL (ref 65–117)
Glucose (POC): 166 mg/dL — ABNORMAL HIGH (ref 65–117)
Glucose (POC): 175 mg/dL — ABNORMAL HIGH (ref 65–117)

## 2021-05-26 LAB — PHOSPHORUS
Phosphorus: 3.4 MG/DL (ref 2.6–4.7)
Phosphorus: 3.4 MG/DL (ref 2.6–4.7)

## 2021-05-26 LAB — MAGNESIUM
Magnesium: 2.1 mg/dL (ref 1.6–2.4)
Magnesium: 2.1 mg/dL (ref 1.6–2.4)

## 2021-05-26 LAB — POCT GLUCOSE
POC Glucose: 247 mg/dL — ABNORMAL HIGH (ref 65–117)
POC Glucose: 112 mg/dL (ref 65–117)
POC Glucose: 166 mg/dL — ABNORMAL HIGH (ref 65–117)
POC Glucose: 175 mg/dL — ABNORMAL HIGH (ref 65–117)

## 2021-05-26 LAB — COMPREHENSIVE METABOLIC PANEL
ALT: 25 U/L (ref 12–78)
AST: 20 U/L (ref 15–37)
Albumin/Globulin Ratio: 0.8 — ABNORMAL LOW (ref 1.1–2.2)
Albumin: 2.8 g/dL — ABNORMAL LOW (ref 3.5–5.0)
Alkaline Phosphatase: 37 U/L — ABNORMAL LOW (ref 45–117)
Anion Gap: 1 mmol/L — ABNORMAL LOW (ref 5–15)
BUN: 24 MG/DL — ABNORMAL HIGH (ref 6–20)
Bun/Cre Ratio: 23 — ABNORMAL HIGH (ref 12–20)
CO2: 29 mmol/L (ref 21–32)
Calcium: 8.8 MG/DL (ref 8.5–10.1)
Chloride: 108 mmol/L (ref 97–108)
Creatinine: 1.04 MG/DL — ABNORMAL HIGH (ref 0.55–1.02)
ESTIMATED GLOMERULAR FILTRATION RATE: >60 mL/min/{1.73_m2} (ref >60)
Globulin: 3.3 g/dL (ref 2.0–4.0)
Glucose: 115 mg/dL — ABNORMAL HIGH (ref 65–100)
Potassium: 4.1 mmol/L (ref 3.5–5.1)
Sodium: 138 mmol/L (ref 136–145)
Total Bilirubin: 1.6 MG/DL — ABNORMAL HIGH (ref 0.2–1.0)
Total Protein: 6.1 g/dL — ABNORMAL LOW (ref 6.4–8.2)

## 2021-05-26 LAB — PROBNP, N-TERMINAL: BNP: 2300 PG/ML — ABNORMAL HIGH (ref <125)

## 2021-05-26 MED FILL — TRAZODONE 50 MG TAB: 50 mg | ORAL | Qty: 2

## 2021-05-26 MED FILL — RISAQUAD 8 BILLION CELL CAPSULE: 8 billion cell | ORAL | Qty: 1

## 2021-05-26 MED FILL — BUDESONIDE 0.5 MG/2 ML NEB SUSPENSION: 0.5 mg/2 mL | RESPIRATORY_TRACT | Qty: 1

## 2021-05-26 MED FILL — MEROPENEM 1 GRAM IV SOLR: 1 gram | INTRAVENOUS | Qty: 1

## 2021-05-26 MED FILL — CARAFATE 1 GRAM TABLET: 1 gram | ORAL | Qty: 1

## 2021-05-26 MED FILL — HEPARIN (PORCINE) 5,000 UNIT/ML IJ SOLN: 5000 unit/mL | INTRAMUSCULAR | Qty: 1

## 2021-05-26 MED FILL — ATORVASTATIN 10 MG TAB: 10 mg | ORAL | Qty: 1

## 2021-05-26 MED FILL — MIDODRINE 5 MG TAB: 5 mg | ORAL | Qty: 2

## 2021-05-26 MED FILL — ONDANSETRON (PF) 4 MG/2 ML INJECTION: 4 mg/2 mL | INTRAMUSCULAR | Qty: 2

## 2021-05-26 MED FILL — INSULIN LISPRO 100 UNIT/ML INJECTION: 100 unit/mL | SUBCUTANEOUS | Qty: 2

## 2021-05-26 MED FILL — MEXILETINE 150 MG CAP: 150 mg | ORAL | Qty: 1

## 2021-05-26 MED FILL — OXYCODONE 5 MG TAB: 5 mg | ORAL | Qty: 1

## 2021-05-26 MED FILL — HYDROCORTISONE 10 MG TAB: 10 mg | ORAL | Qty: 2

## 2021-05-26 MED FILL — BUMETANIDE 0.25 MG/ML IJ SOLN: 0.25 mg/mL | INTRAMUSCULAR | Qty: 4

## 2021-05-26 MED FILL — IPRATROPIUM-ALBUTEROL 2.5 MG-0.5 MG/3 ML NEB SOLUTION: 2.5 mg-0.5 mg/3 ml | RESPIRATORY_TRACT | Qty: 3

## 2021-05-26 MED FILL — PANTOPRAZOLE 40 MG TAB, DELAYED RELEASE: 40 mg | ORAL | Qty: 1

## 2021-05-26 MED FILL — CITALOPRAM 20 MG TAB: 20 mg | ORAL | Qty: 2

## 2021-05-26 MED FILL — AMIODARONE 200 MG TAB: 200 mg | ORAL | Qty: 2

## 2021-05-26 MED FILL — LEVOTHYROXINE 150 MCG TAB: 150 mcg | ORAL | Qty: 1

## 2021-05-26 MED FILL — MONTELUKAST 10 MG TAB: 10 mg | ORAL | Qty: 1

## 2021-05-26 MED FILL — ASPIRIN 81 MG CHEWABLE TAB: 81 mg | ORAL | Qty: 1

## 2021-05-26 NOTE — Progress Notes (Signed)
Progress  Notes by Otho Darner, MD at 05/26/21 7829                Author: Otho Darner, MD  Service: Internal Medicine  Author Type: Physician       Filed: 05/26/21 1213  Date of Service: 05/26/21 0851  Status: Addendum          Editor: Otho Darner, MD (Physician)          Related Notes: Original Note by Otho Darner, MD (Physician) filed at 05/26/21 1212               [facility]   [address] [place], [place], [place] [address]   [phone]         Hospitalist Progress Note         NAME: Deborah [name]    DOB:  11-Jan-1956   MRM:  [id number]      Date of service: 05/26/2021  8:51 AM               Assessment and Plan:     1.  ESBL E. Coli UTI/recent group B strep bacteremia: completed IV CTX on 4/26. Now on IV meropenem through 04/29 per ID       2.  Hypotension: refractory, but seems improving.  Unclear etiology.  Unlikely sepsis.  Cont to hold all diuretics.  On hydrocortisone and midodrine. Echo with EF 60-65%. off pressors gtt       3.   Acute resp failure with hypoxia/hypercapnea: due to CHF, OSA, obesity hypoventilation. cont supplement O2, diamox prn, bipap.  Pulm  following       4.  Acute on chronic dCHF: EF 60-65. Holding bumex, diamox due to hypotension.  Cardiology following       5.   PVCs/bioprosthetic AVR: s/p pacer.  Cont ASA, statin. FU with cardiology        6.  Anemia/thrombocytopenia: S/p IV iron.  Monitor CBC       7.  DM type 2: A1C 5.7%.  Cont SSI       8.  Weakness: cont PT/OT.  Will need HH       9.  Morbid obesity/OSA. Would benefit from weight loss. Cont CPAP                  Subjective:        Chief Complaint:: Patient was seen and examined as a follow up for UTI.  Chart was reviewed. c/o BL leg swelling       ROS:   (bold if positive,  if negative)      Tolerating PT  Tolerating Diet                Objective:        Last 24hrs VS reviewed since prior progress note. Most recent are:      Visit Vitals      BP  (!) 108/55     Pulse  83     Temp   97.4 F (36.3 C)     Resp  18     Ht   (1.702 m)     Wt  154.5 kg (340 lb 9.6 oz)     SpO2  100%        BMI  53.35 kg/m        SpO2 Readings from Last 6 Encounters:      05/26/21  100%      04/24/21  97%      03/09/21  96%      01/26/21  90%      12/03/20  96%      11/05/20  92%           O2 Flow Rate (L/min): 2 l/min        Intake/Output Summary (Last 24 hours) at 05/26/2021 0851   Last data filed at 05/26/2021 0631     Gross per 24 hour        Intake  980 ml        Output  650 ml        Net  330 ml            Physical Exam:      Gen:  obese, in no acute distress   HEENT:  Pink conjunctivae, PERRL, hearing intact to voice, moist mucous membranes   Neck:  Supple, without masses, thyroid non-tender   Resp:  No accessory muscle use, clear breath sounds without wheezes rales or rhonchi   Card:  No murmurs, normal S1, S2 without thrills, bruits. +++ peripheral edema   Abd:  Soft, non-tender, non-distended, normoactive bowel sounds are present, no palpable organomegaly and no detectable hernias   Lymph:  No cervical or inguinal adenopathy   Musc:  No cyanosis or clubbing   Skin:  No rashes or ulcers, skin turgor is good   Neuro:  Cranial nerves are grossly intact, no focal motor weakness, follows commands appropriately   Psych:  Good insight, oriented to person, place and time, alert   __________________________________________________________________   Medications Reviewed: (see below)     Medications:          Current Facility-Administered Medications          Medication  Dose  Route  Frequency           ?  bumetanide (BUMEX) injection 1 mg   1 mg  IntraVENous  BID           ?  mexiletine (MEXITIL) capsule 150 mg   150 mg  Oral  TID     ?  meropenem (MERREM) 1 g in 0.9% sodium chloride (MBP/ADV) 50 mL MBP   1 g  IntraVENous  Q8H     ?  hydrocortisone (CORTEF) tablet 20 mg   20 mg  Oral  BID WITH MEALS     ?  PHENYLephrine (NEO-SYNEPHRINE) 50 mg in 0.9% sodium chloride 250 mL (Vial2Bag)   10-100 mcg/min   IntraVENous  TITRATE     ?  midodrine (PROAMATINE) tablet 10 mg   10 mg  Oral  TID WITH MEALS     ?  amiodarone (CORDARONE) tablet 400 mg   400 mg  Oral  BID     ?  L.acidophilus-paracasei-S.thermophil-bifidobacter (RISAQUAD) 8 billion cell capsule   1 Capsule  Oral  DAILY     ?  albuterol-ipratropium (DUO-NEB) 2.5 MG-0.5 MG/3 ML   3 mL  Nebulization  Q6H RT     ?  pantoprazole (PROTONIX) tablet 40 mg   40 mg  Oral  ACB     ?  heparin (porcine) injection 5,000 Units   5,000 Units  SubCUTAneous  Q8H     ?  [Held by provider] metoprolol succinate (TOPROL-XL) XL tablet 25 mg   25 mg  Oral  DAILY     ?  [Held by provider] acetaZOLAMIDE (DIAMOX) tablet 250 mg   250 mg  Oral  BID     ?  nystatin (MYCOSTATIN) 100,000 unit/gram cream     Topical  BID     ?  traZODone (DESYREL) tablet 100 mg   100 mg  Oral  QHS     ?  oxyCODONE IR (ROXICODONE) tablet 5 mg   5 mg  Oral  Q6H PRN           ?  albuterol-ipratropium (DUO-NEB) 2.5 MG-0.5 MG/3 ML   3 mL  Nebulization  Q4H PRN           ?  insulin lispro (HUMALOG) injection     SubCUTAneous  AC&HS     ?  glucose chewable tablet 16 g   4 Tablet  Oral  PRN     ?  glucagon (GLUCAGEN) injection 1 mg   1 mg  IntraMUSCular  PRN     ?  dextrose 10% infusion 0-250 mL   0-250 mL  IntraVENous  PRN     ?  [Held by provider] arformoteroL (BROVANA) neb solution 15 mcg   15 mcg  Nebulization  BID RT     ?  budesonide (PULMICORT) 500 mcg/2 ml nebulizer suspension   500 mcg  Nebulization  BID RT     ?  sodium chloride (NS) flush 5-40 mL   5-40 mL  IntraVENous  Q8H     ?  sodium chloride (NS) flush 5-40 mL   5-40 mL  IntraVENous  PRN     ?  acetaminophen (TYLENOL) tablet 650 mg   650 mg  Oral  Q6H PRN          Or           ?  acetaminophen (TYLENOL) suppository 650 mg   650 mg  Rectal  Q6H PRN     ?  polyethylene glycol (MIRALAX) packet 17 g   17 g  Oral  DAILY PRN     ?  senna (SENOKOT) tablet 8.6 mg   1 Tablet  Oral  DAILY PRN     ?  promethazine (PHENERGAN) tablet 12.5 mg   12.5 mg  Oral  Q6H PRN           Or           ?  ondansetron (ZOFRAN) injection 4 mg   4 mg  IntraVENous  Q6H PRN     ?  aspirin chewable tablet 81 mg   81 mg  Oral  DAILY     ?  atorvastatin (LIPITOR) tablet 10 mg   10 mg  Oral  DAILY     ?  citalopram (CELEXA) tablet 40 mg   40 mg  Oral  DAILY     ?  [Held by provider] glipiZIDE (GLUCOTROL) tablet 5 mg   5 mg  Oral  BID WITH MEALS     ?  levothyroxine (SYNTHROID) tablet 150 mcg   150 mcg  Oral  ACB           ?  montelukast (SINGULAIR) tablet 10 mg   10 mg  Oral  QHS           ?  sucralfate (CARAFATE) tablet 1 g   1 g  Oral  TID            Lab Data Reviewed: (see below)     Lab Review:          Recent Labs           05/24/21   0312  WBC  7.8     HGB  9.2*     HCT  31.2*        PLT  110*          Recent Labs            05/26/21   0311  05/24/21   0312     NA  138  139     K  4.1  4.2     CL  108  107     CO2  29  33*     GLU  115*  117*     BUN  24*  23*     CREA  1.04*  1.13*     CA  8.8  9.2     MG  2.1  2.2     PHOS  3.4  3.2     ALB  2.8*   --      TBILI  1.6*   --          ALT  25   --           Lab Results         Component  Value  Date/Time            Glucose (POC)  112  05/26/2021 07:52 AM       Glucose (POC)  247 (H)  05/25/2021 08:55 PM       Glucose (POC)  172 (H)  05/25/2021 03:23 PM       Glucose (POC)  205 (H)  05/25/2021 10:47 AM            Glucose (POC)  107  05/25/2021 08:01 AM        No results for input(s): PH, PCO2, PO2, HCO3, FIO2 in the last 72 hours.   No results for input(s): INR, INREXT in the last 72 hours.     All Micro Results                  Procedure  Component  Value  Units  Date/Time           CULTURE, BLOOD, PAIRED [[id number]]  Collected: 05/17/21 1012            Order Status: Completed  Specimen: Blood  Updated: 05/22/21 0611                Special Requests:  NO SPECIAL REQUESTS              Culture result:  NO GROWTH 5 DAYS                CULTURE, URINE [[id number]]  (Abnormal)  (Susceptibility)  Collected: 05/17/21 1458            Order Status:  Completed  Specimen: Urine from Clean catch  Updated: 05/20/21 0843                Special Requests:  NO SPECIAL REQUESTS                     Colony Count  --                  80000   COLONIES/mL                  Culture result:                 Escherichia coli ** (EXTENDED SPECTRUM BETA LACTAMASE PRODUCER) **  MIXED UROGENITAL FLORA ISOLATED                     URINE CULTURE HOLD SAMPLE [[id number]]  Collected: 05/17/21 1458            Order Status: Completed  Specimen: Urine  Updated: 05/17/21 1503               Urine culture hold                 Urine on hold in Microbiology dept for 2 days.  If unpreserved urine is submitted, it cannot be used for addtional testing after 24 hours,  recollection will be required.                                   I have reviewed notes of prior 24hr.      Other pertinent lab:       Total time: -35- minutes **I personally saw and examined the patient during this time period**      I personally reviewed chart, notes, data and current medications in the medical record.  I have personally examined and treated the patient at bedside during this period.                   Care Plan discussed with: Patient, Nursing Staff, and >50% of time spent in counseling and coordination of care      Discussed:  Care Plan      Prophylaxis:  Hep SQ      Disposition:  Home w/Family             ___________________________________________________      Attending Physician: Otho DarnerMesfin Goldie Dimmer, MD

## 2021-05-26 NOTE — Progress Notes (Signed)
Problem: Pressure Injury - Risk of  Goal: *Prevention of pressure injury  Description: Document Braden Scale and appropriate interventions in the flowsheet.  Outcome: Progressing Towards Goal  Note: Pressure Injury Interventions:  Sensory Interventions: Assess changes in LOC    Moisture Interventions: Absorbent underpads, Maintain skin hydration (lotion/cream), Internal/External urinary devices    Activity Interventions: Assess need for specialty bed, PT/OT evaluation, Pressure redistribution bed/mattress(bed type)    Mobility Interventions: Assess need for specialty bed, Pressure redistribution bed/mattress (bed type), PT/OT evaluation, Turn and reposition approx. every two hours(pillow and wedges)    Nutrition Interventions: Document food/fluid/supplement intake, Discuss nutritional consult with provider    Friction and Shear Interventions: Apply protective barrier, creams and emollients, Lift team/patient mobility team, Transferring/repositioning devices                Problem: Patient Education: Go to Patient Education Activity  Goal: Patient/Family Education  Outcome: Progressing Towards Goal     Problem: Falls - Risk of  Goal: *Absence of Falls  Description: Document Patrcia Dolly Fall Risk and appropriate interventions in the flowsheet.  Outcome: Progressing Towards Goal  Note: Fall Risk Interventions:  Mobility Interventions: Assess mobility with egress test, Bed/chair exit alarm, Communicate number of staff needed for ambulation/transfer, OT consult for ADLs, Patient to call before getting OOB, PT Consult for mobility concerns, PT Consult for assist device competence, Strengthening exercises (ROM-active/passive), Utilize walker, cane, or other assistive device, Utilize gait belt for transfers/ambulation         Medication Interventions: Bed/chair exit alarm, Evaluate medications/consider consulting pharmacy, Patient to call before getting OOB, Teach patient to arise slowly, Utilize gait belt for  transfers/ambulation    Elimination Interventions: Bed/chair exit alarm, Call light in reach, Elevated toilet seat, Patient to call for help with toileting needs, Stay With Me (per policy), Toilet paper/wipes in reach, Toileting schedule/hourly rounds    History of Falls Interventions: Bed/chair exit alarm, Door open when patient unattended, Evaluate medications/consider consulting pharmacy, Room close to nurse's station, Utilize gait belt for transfer/ambulation, Vital signs minimum Q4HRs X 24 hrs (comment for end date)         Problem: Patient Education: Go to Patient Education Activity  Goal: Patient/Family Education  Outcome: Progressing Towards Goal     Problem: Breathing Pattern - Ineffective  Goal: *Absence of hypoxia  Outcome: Progressing Towards Goal  Goal: *Use of effective breathing techniques  Outcome: Progressing Towards Goal     Problem: TIA/CVA Stroke: 0-24 hours  Goal: Off Pathway (Use only if patient is Off Pathway)  Outcome: Resolved/Met  Goal: Activity/Safety  Outcome: Resolved/Met  Goal: Consults, if ordered  Outcome: Resolved/Met  Goal: Diagnostic Test/Procedures  Outcome: Resolved/Met  Goal: Nutrition/Diet  Outcome: Resolved/Met  Goal: Discharge Planning  Outcome: Resolved/Met  Goal: Medications  Outcome: Resolved/Met  Goal: Respiratory  Outcome: Resolved/Met  Goal: Treatments/Interventions/Procedures  Outcome: Resolved/Met  Goal: Minimize risk of bleeding post-thrombolytic infusion  Outcome: Resolved/Met  Goal: Monitor for complications post-thrombolytic infusion  Outcome: Resolved/Met  Goal: Psychosocial  Outcome: Resolved/Met  Goal: *Hemodynamically stable  Outcome: Resolved/Met  Goal: *Neurologically stable  Description: Absence of additional neurological deficits    Outcome: Resolved/Met  Goal: *Verbalizes anxiety and depression are reduced or absent  Outcome: Resolved/Met  Goal: *Absence of Signs of Aspiration on Current Diet  Outcome: Resolved/Met  Goal: *Absence of deep venous  thrombosis signs and symptoms(Stroke Metric)  Outcome: Resolved/Met  Goal: *Ability to perform ADLs and demonstrates progressive mobility and function  Outcome: Resolved/Met  Goal: *Stroke education  started(Stroke Metric)  Outcome: Resolved/Met  Goal: *Dysphagia screen performed(Stroke Metric)  Outcome: Resolved/Met  Goal: *Rehab consulted(Stroke Metric)  Outcome: Resolved/Met     Problem: TIA/CVA Stroke: Day 2 Until Discharge  Goal: Off Pathway (Use only if patient is Off Pathway)  Outcome: Resolved/Met  Goal: Activity/Safety  Outcome: Resolved/Met  Goal: Diagnostic Test/Procedures  Outcome: Resolved/Met  Goal: Nutrition/Diet  Outcome: Resolved/Met  Goal: Discharge Planning  Outcome: Resolved/Met  Goal: Medications  Outcome: Resolved/Met  Goal: Respiratory  Outcome: Resolved/Met  Goal: Treatments/Interventions/Procedures  Outcome: Resolved/Met  Goal: Psychosocial  Outcome: Resolved/Met  Goal: *Verbalizes anxiety and depression are reduced or absent  Outcome: Resolved/Met  Goal: *Absence of aspiration  Outcome: Resolved/Met  Goal: *Absence of deep venous thrombosis signs and symptoms(Stroke Metric)  Outcome: Resolved/Met  Goal: *Optimal pain control at patient's stated goal  Outcome: Resolved/Met  Goal: *Tolerating diet  Outcome: Resolved/Met  Goal: *Ability to perform ADLs and demonstrates progressive mobility and function  Outcome: Resolved/Met  Goal: *Stroke education continued(Stroke Metric)  Outcome: Resolved/Met

## 2021-05-26 NOTE — Progress Notes (Signed)
1930 Bedside shift change report given to Ivor Messier RN (oncoming nurse) by Mardene Celeste RN (offgoing nurse). Report included the following information SBAR, Intake/Output, MAR, Recent Results, and Cardiac Rhythm A paced .      2100 Patient is asking for something for a yeast infection, per patient she has yellow discharge and is experiencing itchiness.    2100 Patient is complaining of pain, BP is 81/58. Will reach out to MD for different pain medication.     0700 Bedside shift change report given to Angelita RN (oncoming nurse) by Ivor Messier RN (offgoing nurse). Report included the following information SBAR, Intake/Output, MAR, Recent Results, and Cardiac Rhythm A paced .      This patient was assisted with Intentional Toileting every 2 hours during this shift as appropriate.  Documentation of ambulation and output reflected on Flowsheet as appropriate.  Purposeful hourly rounding was completed using AIDET and 5Ps.  Outcomes of PHR documented as they occurred. Bed alarm in use as appropriate.  Dual Suction and ambubag in place.

## 2021-05-26 NOTE — Progress Notes (Signed)
Progress  Notes by Raye Sorrow at 05/26/21 1656                Author: Raye Sorrow  Service: --  Author Type: Technician       Filed: 05/26/21 1656  Date of Service: 05/26/21 1656  Status: Signed          Editor: Raye Sorrow (Technician)               This patient was assisted with Intentional Toileting every 2 hours during this shift as appropriate.  Documentation of ambulation and output reflected on Flowsheet as appropriate.   Purposeful hourly rounding was completed using AIDET and 5Ps.  Outcomes of PHR documented as they occurred. Bed alarm in use as appropriate.  Dual Suction and ambubag in place.

## 2021-05-26 NOTE — Progress Notes (Signed)
0700  Bedside and Verbal shift change report given to Terence Lux (oncoming nurse) by Nechama Guard (offgoing nurse). Report included the following information SBAR, Kardex, Procedure Summary, Intake/Output, MAR, Accordion, Recent Results, and Cardiac Rhythm Apaced .    Initial assessment done. AOX4. No complaints of pain. Bed alarm on. Call bell within reach. Will continue to monitor.      This patient was assisted with Intentional Toileting every 2 hours during this shift as appropriate.  Documentation of ambulation and output reflected on Flowsheet as appropriate.  Purposeful hourly rounding was completed using AIDET and 5Ps.  Outcomes of PHR documented as they occurred. Bed alarm in use as appropriate.  Dual Suction and ambubag in place.

## 2021-05-27 LAB — CBC WITH AUTOMATED DIFF
ABS. BASOPHILS: 0 10*3/uL (ref 0.0–0.1)
ABS. EOSINOPHILS: 0.1 10*3/uL (ref 0.0–0.4)
ABS. IMM. GRANS.: 0 10*3/uL (ref 0.00–0.04)
ABS. LYMPHOCYTES: 0.9 10*3/uL (ref 0.8–3.5)
ABS. MONOCYTES: 0.3 10*3/uL (ref 0.0–1.0)
ABS. NEUTROPHILS: 2.3 10*3/uL (ref 1.8–8.0)
ABSOLUTE NRBC: 0 10*3/uL (ref 0.00–0.01)
BASOPHILS: 0 % (ref 0–1)
EOSINOPHILS: 3 % (ref 0–7)
HCT: 30.6 % — ABNORMAL LOW (ref 35.0–47.0)
HGB: 8.9 g/dL — ABNORMAL LOW (ref 11.5–16.0)
IMMATURE GRANULOCYTES: 0 % (ref 0.0–0.5)
LYMPHOCYTES: 25 % (ref 12–49)
MCH: 27.7 PG (ref 26.0–34.0)
MCHC: 29.1 g/dL — ABNORMAL LOW (ref 30.0–36.5)
MCV: 95.3 FL (ref 80.0–99.0)
MONOCYTES: 9 % (ref 5–13)
MPV: 11.3 FL (ref 8.9–12.9)
NEUTROPHILS: 63 % (ref 32–75)
NRBC: 0 PER 100 WBC
PLATELET: 94 10*3/uL — ABNORMAL LOW (ref 150–400)
RBC: 3.21 M/uL — ABNORMAL LOW (ref 3.80–5.20)
RDW: 17 % — ABNORMAL HIGH (ref 11.5–14.5)
WBC: 3.7 10*3/uL (ref 3.6–11.0)

## 2021-05-27 LAB — METABOLIC PANEL, BASIC
Anion gap: 0 mmol/L — ABNORMAL LOW (ref 5–15)
BUN/Creatinine ratio: 22 — ABNORMAL HIGH (ref 12–20)
BUN: 21 MG/DL — ABNORMAL HIGH (ref 6–20)
CO2: 30 mmol/L (ref 21–32)
Calcium: 9.1 MG/DL (ref 8.5–10.1)
Creatinine: 0.95 MG/DL (ref 0.55–1.02)
Glucose: 122 mg/dL — ABNORMAL HIGH (ref 65–100)
Potassium: 4.7 mmol/L (ref 3.5–5.1)
Sodium: 137 mmol/L (ref 136–145)
eGFR: >60 mL/min/{1.73_m2} (ref >60)

## 2021-05-27 LAB — GLUCOSE, POC
Glucose (POC): 173 mg/dL — ABNORMAL HIGH (ref 65–117)
Glucose (POC): 116 mg/dL (ref 65–117)
Glucose (POC): 176 mg/dL — ABNORMAL HIGH (ref 65–117)
Glucose (POC): 154 mg/dL — ABNORMAL HIGH (ref 65–117)

## 2021-05-27 LAB — MAGNESIUM
Magnesium: 2 mg/dL (ref 1.6–2.4)
Magnesium: 2 mg/dL (ref 1.6–2.4)

## 2021-05-27 LAB — PHOSPHORUS
Phosphorus: 3.6 MG/DL (ref 2.6–4.7)
Phosphorus: 3.6 MG/DL (ref 2.6–4.7)

## 2021-05-27 LAB — BASIC METABOLIC PANEL
Anion Gap: 0 mmol/L — ABNORMAL LOW (ref 5–15)
BUN: 21 MG/DL — ABNORMAL HIGH (ref 6–20)
Bun/Cre Ratio: 22 — ABNORMAL HIGH (ref 12–20)
CO2: 30 mmol/L (ref 21–32)
Calcium: 9.1 MG/DL (ref 8.5–10.1)
Chloride: 107 mmol/L (ref 97–108)
Creatinine: 0.95 MG/DL (ref 0.55–1.02)
ESTIMATED GLOMERULAR FILTRATION RATE: >60 mL/min/{1.73_m2} (ref >60)
Glucose: 122 mg/dL — ABNORMAL HIGH (ref 65–100)
Potassium: 4.7 mmol/L (ref 3.5–5.1)
Sodium: 137 mmol/L (ref 136–145)

## 2021-05-27 LAB — CBC WITH AUTO DIFFERENTIAL
Basophils %: 0 % (ref 0–1)
Basophils Absolute: 0 10*3/uL (ref 0.0–0.1)
Eosinophils %: 3 % (ref 0–7)
Eosinophils Absolute: 0.1 10*3/uL (ref 0.0–0.4)
Granulocyte Absolute Count: 0 10*3/uL (ref 0.00–0.04)
Hematocrit: 30.6 % — ABNORMAL LOW (ref 35.0–47.0)
Hemoglobin: 8.9 g/dL — ABNORMAL LOW (ref 11.5–16.0)
Immature Granulocytes: 0 % (ref 0.0–0.5)
Lymphocytes %: 25 % (ref 12–49)
Lymphocytes Absolute: 0.9 10*3/uL (ref 0.8–3.5)
MCH: 27.7 PG (ref 26.0–34.0)
MCHC: 29.1 g/dL — ABNORMAL LOW (ref 30.0–36.5)
MCV: 95.3 FL (ref 80.0–99.0)
MPV: 11.3 FL (ref 8.9–12.9)
Monocytes %: 9 % (ref 5–13)
Monocytes Absolute: 0.3 10*3/uL (ref 0.0–1.0)
NRBC Absolute: 0 10*3/uL (ref 0.00–0.01)
Neutrophils %: 63 % (ref 32–75)
Neutrophils Absolute: 2.3 10*3/uL (ref 1.8–8.0)
Nucleated RBCs: 0 PER 100 WBC
Platelets: 94 10*3/uL — ABNORMAL LOW (ref 150–400)
RBC: 3.21 M/uL — ABNORMAL LOW (ref 3.80–5.20)
RDW: 17 % — ABNORMAL HIGH (ref 11.5–14.5)
WBC: 3.7 10*3/uL (ref 3.6–11.0)

## 2021-05-27 LAB — POCT GLUCOSE
POC Glucose: 173 mg/dL — ABNORMAL HIGH (ref 65–117)
POC Glucose: 116 mg/dL (ref 65–117)
POC Glucose: 176 mg/dL — ABNORMAL HIGH (ref 65–117)
POC Glucose: 154 mg/dL — ABNORMAL HIGH (ref 65–117)

## 2021-05-27 MED ORDER — CLOTRIMAZOLE 1 % VAGINAL CREAM
1 % | Freq: Every evening | VAGINAL | Status: AC
Start: 2021-05-27 — End: 2021-06-02
  Administered 2021-05-27 – 2021-05-29 (×3): via VAGINAL

## 2021-05-27 MED ORDER — CYCLOBENZAPRINE 10 MG TAB
10 mg | Freq: Once | ORAL | Status: AC
Start: 2021-05-27 — End: 2021-05-26
  Administered 2021-05-27: 03:00:00 via ORAL

## 2021-05-27 MED FILL — MEXILETINE 150 MG CAP: 150 mg | ORAL | Qty: 1

## 2021-05-27 MED FILL — IPRATROPIUM-ALBUTEROL 2.5 MG-0.5 MG/3 ML NEB SOLUTION: 2.5 mg-0.5 mg/3 ml | RESPIRATORY_TRACT | Qty: 3

## 2021-05-27 MED FILL — BUMETANIDE 0.25 MG/ML IJ SOLN: 0.25 mg/mL | INTRAMUSCULAR | Qty: 4

## 2021-05-27 MED FILL — LEVOTHYROXINE 150 MCG TAB: 150 mcg | ORAL | Qty: 1

## 2021-05-27 MED FILL — BUDESONIDE 0.5 MG/2 ML NEB SUSPENSION: 0.5 mg/2 mL | RESPIRATORY_TRACT | Qty: 1

## 2021-05-27 MED FILL — MIDODRINE 5 MG TAB: 5 mg | ORAL | Qty: 2

## 2021-05-27 MED FILL — ACETAMINOPHEN 325 MG TABLET: 325 mg | ORAL | Qty: 2

## 2021-05-27 MED FILL — ASPIRIN 81 MG CHEWABLE TAB: 81 mg | ORAL | Qty: 1

## 2021-05-27 MED FILL — CITALOPRAM 20 MG TAB: 20 mg | ORAL | Qty: 2

## 2021-05-27 MED FILL — ATORVASTATIN 10 MG TAB: 10 mg | ORAL | Qty: 1

## 2021-05-27 MED FILL — MEROPENEM 1 GRAM IV SOLR: 1 gram | INTRAVENOUS | Qty: 1

## 2021-05-27 MED FILL — PANTOPRAZOLE 40 MG TAB, DELAYED RELEASE: 40 mg | ORAL | Qty: 1

## 2021-05-27 MED FILL — HYDROCORTISONE 10 MG TAB: 10 mg | ORAL | Qty: 2

## 2021-05-27 MED FILL — INSULIN LISPRO 100 UNIT/ML INJECTION: 100 unit/mL | SUBCUTANEOUS | Qty: 2

## 2021-05-27 MED FILL — AMIODARONE 200 MG TAB: 200 mg | ORAL | Qty: 2

## 2021-05-27 MED FILL — CYCLOBENZAPRINE 10 MG TAB: 10 mg | ORAL | Qty: 1

## 2021-05-27 MED FILL — TRAZODONE 50 MG TAB: 50 mg | ORAL | Qty: 2

## 2021-05-27 MED FILL — HEPARIN (PORCINE) 5,000 UNIT/ML IJ SOLN: 5000 unit/mL | INTRAMUSCULAR | Qty: 1

## 2021-05-27 MED FILL — CLOTRIMAZOLE 1 % VAGINAL CREAM: 1 % | VAGINAL | Qty: 1

## 2021-05-27 MED FILL — CARAFATE 1 GRAM TABLET: 1 gram | ORAL | Qty: 1

## 2021-05-27 MED FILL — MONTELUKAST 10 MG TAB: 10 mg | ORAL | Qty: 1

## 2021-05-27 MED FILL — RISAQUAD 8 BILLION CELL CAPSULE: 8 billion cell | ORAL | Qty: 1

## 2021-05-27 NOTE — Progress Notes (Signed)
Problem: Pressure Injury - Risk of  Goal: *Prevention of pressure injury  Description: Document Braden Scale and appropriate interventions in the flowsheet.  Outcome: Progressing Towards Goal  Note: Pressure Injury Interventions:  Sensory Interventions: Assess changes in LOC    Moisture Interventions: Check for incontinence Q2 hours and as needed, Minimize layers    Activity Interventions: Increase time out of bed    Mobility Interventions: HOB 30 degrees or less, Pressure redistribution bed/mattress (bed type), PT/OT evaluation    Nutrition Interventions: Document food/fluid/supplement intake    Friction and Shear Interventions: HOB 30 degrees or less, Minimize layers                Problem: Patient Education: Go to Patient Education Activity  Goal: Patient/Family Education  Outcome: Progressing Towards Goal

## 2021-05-27 NOTE — Progress Notes (Signed)
Bedside and Verbal shift change report given to Angelita,RN (oncoming nurse) by Nechama Guard (offgoing nurse). Report included the following information SBAR, Kardex, ED Summary, MAR, Recent Results, and Cardiac Rhythm A Paced .     This patient was assisted with Intentional Toileting every 2 hours during this shift as appropriate.  Documentation of ambulation and output reflected on Flowsheet as appropriate.  Purposeful hourly rounding was completed using AIDET and 5Ps.  Outcomes of PHR documented as they occurred. Bed alarm in use as appropriate.  Dual Suction and ambubag in place.    Central line Type: PICC   Central Line Insert Date: 04/13/21  Reason Central Line Placed: Long Term Antibiotics  Central Line Dressing Date: 04/16/21  Biopatch in place? Yes No: N/A  Tubing labeled and appropriate? Yes No: yes  Alcohol caps on all open ports? Yes No: yes  Last CHG bath (time&date): 05/27/21 1500  Reviewed with provider and central line must stay in for the following reasons: Antibiotics        Bedside and Verbal shift change report given to Lauren,RN (oncoming nurse) by Carole Civil (offgoing nurse). Report included the following information SBAR, Kardex, ED Summary, MAR, Recent Results, and Cardiac Rhythm A Paced .

## 2021-05-27 NOTE — Progress Notes (Signed)
Progress  Notes by Otho Darner, MD at 05/27/21 0757                Author: Otho Darner, MD  Service: Internal Medicine  Author Type: Physician       Filed: 05/27/21 0814  Date of Service: 05/27/21 0757  Status: Signed          Editor: Otho Darner, MD (Physician)               [facility]   [address] [place], [place], [place] [address]   [phone]         Hospitalist Progress Note         NAME: Deborah [name]    DOB:  [birth year]   MRM:  [id number]      Date of service: 05/27/2021  8:51 AM               Assessment and Plan:     1.  ESBL E. Coli UTI/recent group B strep bacteremia: completed IV CTX on 4/26. Finished IV meropenem 04/29         2.  Hypotension: refractory, but seems improving.  Unclear etiology.  Unlikely sepsis.  Cont to hold all diuretics.  On hydrocortisone and midodrine. Echo with EF 60-65%. off pressors gtt       3.   Acute resp failure with hypoxia/hypercapnea: due to CHF, OSA, obesity hypoventilation. cont supplement O2, diamox prn, bipap.  Pulm  following       4.  Acute on chronic dCHF: EF 60-65. Holding bumex, diamox due to hypotension.  Cardiology following       5.   PVCs/bioprosthetic AVR: s/p pacer.  Cont ASA, statin. Evaluated by EP cardiology  and plan for PVC ablation at [facility]       6.  Anemia/thrombocytopenia: this is chronic. S/p IV iron.  Monitor CBC. Consult hematology        7.  DM type 2: A1C 5.7%.  Cont SSI       8.  Weakness: cont PT/OT.  Will need HH       9.  Morbid obesity/OSA. Would benefit from weight loss. Cont CPAP                  Subjective:        Chief Complaint:: Patient was seen and examined as a follow up for UTI.  Chart was reviewed. c/o  BL leg swelling       ROS:   (bold if positive,  if negative)      Tolerating PT  Tolerating Diet                Objective:        Last 24hrs VS reviewed since prior progress note. Most recent are:      Visit Vitals      BP  (!) 108/52     Pulse  83     Temp  98.3 F (36.8 C)     Resp   18     Ht   (1.702 m)     Wt  154.5 kg (340 lb 9.6 oz)     SpO2  96%        BMI  53.35 kg/m        SpO2 Readings from Last 6 Encounters:      05/27/21  96%      04/24/21  97%      03/09/21  96%      01/26/21  90%      12/03/20  96%      11/05/20  92%           O2 Flow Rate (L/min): 2 l/min        Intake/Output Summary (Last 24 hours) at 05/27/2021 0757   Last data filed at 05/26/2021 [id number]     Gross per 24 hour        Intake  240 ml        Output  --        Net  240 ml               Physical Exam:      Gen:  obese, in no acute distress   HEENT:  Pink conjunctivae, PERRL, hearing intact to voice, moist mucous membranes   Neck:  Supple, without masses, thyroid non-tender   Resp:  No accessory muscle use, clear breath sounds without wheezes rales or rhonchi   Card:  No murmurs, normal S1, S2 without thrills, bruits. +++ peripheral edema   Abd:  Soft, non-tender, non-distended, normoactive bowel sounds are present, no palpable organomegaly and no detectable hernias   Lymph:  No cervical or inguinal adenopathy   Musc:  No cyanosis or clubbing   Skin:  No rashes or ulcers, skin turgor is good   Neuro:  Cranial nerves are grossly intact, no focal motor weakness, follows commands appropriately   Psych:  Good insight, oriented to person, place and time, alert   __________________________________________________________________   Medications Reviewed: (see below)     Medications:          Current Facility-Administered Medications          Medication  Dose  Route  Frequency           ?  clotrimazole (MYCELEX) 1 % cream 1 Applicator   1 Applicator  Vaginal  QHS           ?  bumetanide (BUMEX) injection 1 mg   1 mg  IntraVENous  BID     ?  mexiletine (MEXITIL) capsule 150 mg   150 mg  Oral  TID     ?  hydrocortisone (CORTEF) tablet 20 mg   20 mg  Oral  BID WITH MEALS     ?  PHENYLephrine (NEO-SYNEPHRINE) 50 mg in 0.9% sodium chloride 250 mL (Vial2Bag)   10-100 mcg/min  IntraVENous  TITRATE     ?  midodrine (PROAMATINE)  tablet 10 mg   10 mg  Oral  TID WITH MEALS     ?  amiodarone (CORDARONE) tablet 400 mg   400 mg  Oral  BID     ?  L.acidophilus-paracasei-S.thermophil-bifidobacter (RISAQUAD) 8 billion cell capsule   1 Capsule  Oral  DAILY     ?  albuterol-ipratropium (DUO-NEB) 2.5 MG-0.5 MG/3 ML   3 mL  Nebulization  Q6H RT     ?  pantoprazole (PROTONIX) tablet 40 mg   40 mg  Oral  ACB     ?  heparin (porcine) injection 5,000 Units   5,000 Units  SubCUTAneous  Q8H     ?  [Held by provider] metoprolol succinate (TOPROL-XL) XL tablet 25 mg   25 mg  Oral  DAILY     ?  [Held by provider] acetaZOLAMIDE (DIAMOX) tablet 250 mg   250 mg  Oral  BID     ?  traZODone (DESYREL) tablet 100 mg  100 mg  Oral  QHS     ?  oxyCODONE IR (ROXICODONE) tablet 5 mg   5 mg  Oral  Q6H PRN     ?  albuterol-ipratropium (DUO-NEB) 2.5 MG-0.5 MG/3 ML   3 mL  Nebulization  Q4H PRN     ?  insulin lispro (HUMALOG) injection     SubCUTAneous  AC&HS     ?  glucose chewable tablet 16 g   4 Tablet  Oral  PRN     ?  glucagon (GLUCAGEN) injection 1 mg   1 mg  IntraMUSCular  PRN     ?  dextrose 10% infusion 0-250 mL   0-250 mL  IntraVENous  PRN     ?  [Held by provider] arformoteroL (BROVANA) neb solution 15 mcg   15 mcg  Nebulization  BID RT     ?  budesonide (PULMICORT) 500 mcg/2 ml nebulizer suspension   500 mcg  Nebulization  BID RT     ?  sodium chloride (NS) flush 5-40 mL   5-40 mL  IntraVENous  Q8H     ?  sodium chloride (NS) flush 5-40 mL   5-40 mL  IntraVENous  PRN     ?  acetaminophen (TYLENOL) tablet 650 mg   650 mg  Oral  Q6H PRN          Or           ?  acetaminophen (TYLENOL) suppository 650 mg   650 mg  Rectal  Q6H PRN     ?  polyethylene glycol (MIRALAX) packet 17 g   17 g  Oral  DAILY PRN     ?  senna (SENOKOT) tablet 8.6 mg   1 Tablet  Oral  DAILY PRN     ?  promethazine (PHENERGAN) tablet 12.5 mg   12.5 mg  Oral  Q6H PRN          Or           ?  ondansetron (ZOFRAN) injection 4 mg   4 mg  IntraVENous  Q6H PRN     ?  aspirin chewable tablet 81 mg   81  mg  Oral  DAILY     ?  atorvastatin (LIPITOR) tablet 10 mg   10 mg  Oral  DAILY     ?  citalopram (CELEXA) tablet 40 mg   40 mg  Oral  DAILY     ?  [Held by provider] glipiZIDE (GLUCOTROL) tablet 5 mg   5 mg  Oral  BID WITH MEALS     ?  levothyroxine (SYNTHROID) tablet 150 mcg   150 mcg  Oral  ACB     ?  montelukast (SINGULAIR) tablet 10 mg   10 mg  Oral  QHS           ?  sucralfate (CARAFATE) tablet 1 g   1 g  Oral  TID            Lab Data Reviewed: (see below)     Lab Review:          Recent Labs           05/27/21   0509     WBC  3.7     HGB  8.9*     HCT  30.6*        PLT  94*             Recent Labs  05/27/21   0509  05/26/21   0311     NA  137  138     K  4.7  4.1     CL  107  108     CO2  30  29     GLU  122*  115*     BUN  21*  24*     CREA  0.95  1.04*     CA  9.1  8.8     MG  2.0  2.1     PHOS  3.6  3.4     ALB   --   2.8*     TBILI   --   1.6*         ALT   --   25             Lab Results         Component  Value  Date/Time            Glucose (POC)  173 (H)  05/26/2021 09:59 PM       Glucose (POC)  175 (H)  05/26/2021 04:06 PM       Glucose (POC)  166 (H)  05/26/2021 10:36 AM       Glucose (POC)  112  05/26/2021 07:52 AM            Glucose (POC)  247 (H)  05/25/2021 08:55 PM        No results for input(s): PH, PCO2, PO2, HCO3, FIO2 in the last 72 hours.   No results for input(s): INR, INREXT, INREXT in the last 72 hours.     All Micro Results                  Procedure  Component  Value  Units  Date/Time           CULTURE, BLOOD, PAIRED [[id number]]  Collected: 05/17/21 1012            Order Status: Completed  Specimen: Blood  Updated: 05/22/21 0611                Special Requests:  NO SPECIAL REQUESTS              Culture result:  NO GROWTH 5 DAYS                CULTURE, URINE [[id number]]  (Abnormal)  (Susceptibility)  Collected: 05/17/21 1458            Order Status: Completed  Specimen: Urine from Clean catch  Updated: 05/20/21 0843                Special Requests:  NO SPECIAL REQUESTS               Colony Count  --                  80000   COLONIES/mL                  Culture result:                 Escherichia coli ** (EXTENDED SPECTRUM BETA LACTAMASE PRODUCER) **                                            MIXED UROGENITAL FLORA ISOLATED  URINE CULTURE HOLD SAMPLE [[id number]]  Collected: 05/17/21 1458            Order Status: Completed  Specimen: Urine  Updated: 05/17/21 1503               Urine culture hold                 Urine on hold in Microbiology dept for 2 days.  If unpreserved urine is submitted, it cannot be used for addtional testing after 24 hours,  recollection will be required.                                   I have reviewed notes of prior 24hr.      Other pertinent lab:       Total time: -35- minutes **I personally saw and examined the patient during this time period**      I personally reviewed chart, notes, data and current medications in the medical record.  I have personally examined and treated the patient at bedside during this period.                   Care Plan discussed with: Patient, Nursing Staff, and >50% of time spent in counseling and coordination of care      Discussed:  Care Plan      Prophylaxis:  Hep SQ      Disposition:  Home w/Family             ___________________________________________________      Attending Physician: Otho Darner, MD

## 2021-05-27 NOTE — Progress Notes (Signed)
1900  Bedside and Verbal shift change report given to Leotis Shames, RN (oncoming nurse) by Carroll Sage, RN (offgoing nurse). Report included the following information SBAR, Kardex, Intake/Output, MAR, Recent Results, and Cardiac Rhythm apaced .      Central line Type: PICC single lumen  Central Line Insert Date: 04/13/21  Reason Central Line Placed: long term antibiotics  Central Line Dressing Date: 05/22/21  Biopatch in place? Yes No: yes  Tubing labeled and appropriate? Yes No: yes  Alcohol caps on all open ports? Yes No: yes  Last CHG bath (time&date): 05/27/21 @ 1500  Reviewed with provider and central line must stay in for the following reasons: antibiotics    This patient was assisted with Intentional Toileting every 2 hours during this shift as appropriate.  Documentation of ambulation and output reflected on Flowsheet as appropriate.  Purposeful hourly rounding was completed using AIDET and 5Ps.  Outcomes of PHR documented as they occurred. Bed alarm in use as appropriate.  Dual Suction and ambubag in place.     0700  Bedside and Verbal shift change report given to Shanda Bumps, Charity fundraiser (oncoming nurse) by Leotis Shames, RN (offgoing nurse). Report included the following information SBAR, Kardex, Intake/Output, MAR, Recent Results, and Cardiac Rhythm apaced .

## 2021-05-28 LAB — CBC WITH AUTOMATED DIFF
ABS. BASOPHILS: 0 10*3/uL (ref 0.0–0.1)
ABS. EOSINOPHILS: 0.1 10*3/uL (ref 0.0–0.4)
ABS. IMM. GRANS.: 0 10*3/uL (ref 0.00–0.04)
ABS. LYMPHOCYTES: 1.2 10*3/uL (ref 0.8–3.5)
ABS. MONOCYTES: 0.3 10*3/uL (ref 0.0–1.0)
ABS. NEUTROPHILS: 2.2 10*3/uL (ref 1.8–8.0)
ABSOLUTE NRBC: 0 10*3/uL (ref 0.00–0.01)
BASOPHILS: 1 % (ref 0–1)
EOSINOPHILS: 2 % (ref 0–7)
HCT: 31.1 % — ABNORMAL LOW (ref 35.0–47.0)
HGB: 9.2 g/dL — ABNORMAL LOW (ref 11.5–16.0)
IMMATURE GRANULOCYTES: 0 % (ref 0.0–0.5)
LYMPHOCYTES: 32 % (ref 12–49)
MCH: 27.8 PG (ref 26.0–34.0)
MCHC: 29.6 g/dL — ABNORMAL LOW (ref 30.0–36.5)
MCV: 94 FL (ref 80.0–99.0)
MONOCYTES: 9 % (ref 5–13)
MPV: 10.6 FL (ref 8.9–12.9)
NEUTROPHILS: 56 % (ref 32–75)
NRBC: 0 PER 100 WBC
PLATELET: 91 10*3/uL — ABNORMAL LOW (ref 150–400)
RBC: 3.31 M/uL — ABNORMAL LOW (ref 3.80–5.20)
RDW: 16.8 % — ABNORMAL HIGH (ref 11.5–14.5)
WBC: 3.8 10*3/uL (ref 3.6–11.0)

## 2021-05-28 LAB — GLUCOSE, POC
Glucose (POC): 189 mg/dL — ABNORMAL HIGH (ref 65–117)
Glucose (POC): 120 mg/dL — ABNORMAL HIGH (ref 65–117)
Glucose (POC): 129 mg/dL — ABNORMAL HIGH (ref 65–117)
Glucose (POC): 207 mg/dL — ABNORMAL HIGH (ref 65–117)

## 2021-05-28 LAB — METABOLIC PANEL, BASIC
Anion gap: 3 mmol/L — ABNORMAL LOW (ref 5–15)
BUN/Creatinine ratio: 22 — ABNORMAL HIGH (ref 12–20)
BUN: 24 MG/DL — ABNORMAL HIGH (ref 6–20)
CO2: 29 mmol/L (ref 21–32)
Calcium: 9.3 MG/DL (ref 8.5–10.1)
Chloride: 105 mmol/L (ref 97–108)
Creatinine: 1.1 MG/DL — ABNORMAL HIGH (ref 0.55–1.02)
Glucose: 111 mg/dL — ABNORMAL HIGH (ref 65–100)
Potassium: 4.1 mmol/L (ref 3.5–5.1)
Sodium: 137 mmol/L (ref 136–145)
eGFR: 56 mL/min/{1.73_m2} — ABNORMAL LOW (ref >60)

## 2021-05-28 LAB — BASIC METABOLIC PANEL
Anion Gap: 3 mmol/L — ABNORMAL LOW (ref 5–15)
BUN: 24 MG/DL — ABNORMAL HIGH (ref 6–20)
Bun/Cre Ratio: 22 — ABNORMAL HIGH (ref 12–20)
CO2: 29 mmol/L (ref 21–32)
Calcium: 9.3 MG/DL (ref 8.5–10.1)
Chloride: 105 mmol/L (ref 97–108)
Creatinine: 1.1 MG/DL — ABNORMAL HIGH (ref 0.55–1.02)
ESTIMATED GLOMERULAR FILTRATION RATE: 56 mL/min/{1.73_m2} — ABNORMAL LOW (ref >60)
Glucose: 111 mg/dL — ABNORMAL HIGH (ref 65–100)
Potassium: 4.1 mmol/L (ref 3.5–5.1)
Sodium: 137 mmol/L (ref 136–145)

## 2021-05-28 LAB — CBC WITH AUTO DIFFERENTIAL
Basophils %: 1 % (ref 0–1)
Basophils Absolute: 0 10*3/uL (ref 0.0–0.1)
Eosinophils %: 2 % (ref 0–7)
Eosinophils Absolute: 0.1 10*3/uL (ref 0.0–0.4)
Granulocyte Absolute Count: 0 10*3/uL (ref 0.00–0.04)
Hematocrit: 31.1 % — ABNORMAL LOW (ref 35.0–47.0)
Hemoglobin: 9.2 g/dL — ABNORMAL LOW (ref 11.5–16.0)
Immature Granulocytes: 0 % (ref 0.0–0.5)
Lymphocytes %: 32 % (ref 12–49)
Lymphocytes Absolute: 1.2 10*3/uL (ref 0.8–3.5)
MCH: 27.8 PG (ref 26.0–34.0)
MCHC: 29.6 g/dL — ABNORMAL LOW (ref 30.0–36.5)
MCV: 94 FL (ref 80.0–99.0)
MPV: 10.6 FL (ref 8.9–12.9)
Monocytes %: 9 % (ref 5–13)
Monocytes Absolute: 0.3 10*3/uL (ref 0.0–1.0)
NRBC Absolute: 0 10*3/uL (ref 0.00–0.01)
Neutrophils %: 56 % (ref 32–75)
Neutrophils Absolute: 2.2 10*3/uL (ref 1.8–8.0)
Nucleated RBCs: 0 PER 100 WBC
Platelets: 91 10*3/uL — ABNORMAL LOW (ref 150–400)
RBC: 3.31 M/uL — ABNORMAL LOW (ref 3.80–5.20)
RDW: 16.8 % — ABNORMAL HIGH (ref 11.5–14.5)
WBC: 3.8 10*3/uL (ref 3.6–11.0)

## 2021-05-28 LAB — POCT GLUCOSE
POC Glucose: 189 mg/dL — ABNORMAL HIGH (ref 65–117)
POC Glucose: 120 mg/dL — ABNORMAL HIGH (ref 65–117)
POC Glucose: 129 mg/dL — ABNORMAL HIGH (ref 65–117)
POC Glucose: 207 mg/dL — ABNORMAL HIGH (ref 65–117)

## 2021-05-28 MED ORDER — BUMETANIDE 1 MG TAB
1 mg | Freq: Two times a day (BID) | ORAL | Status: DC
Start: 2021-05-28 — End: 2021-05-29
  Administered 2021-05-28 (×2): via ORAL

## 2021-05-28 MED ORDER — IRON SUCROSE 100 MG/5 ML IV SOLN
1005 mg iron/5 mL | Freq: Every day | INTRAVENOUS | Status: AC
Start: 2021-05-28 — End: 2021-05-30
  Administered 2021-05-28 – 2021-05-29 (×2): via INTRAVENOUS

## 2021-05-28 MED FILL — MEXILETINE 150 MG CAP: 150 mg | ORAL | Qty: 1

## 2021-05-28 MED FILL — INSULIN LISPRO 100 UNIT/ML INJECTION: 100 unit/mL | SUBCUTANEOUS | Qty: 3

## 2021-05-28 MED FILL — BUMETANIDE 1 MG TAB: 1 mg | ORAL | Qty: 1

## 2021-05-28 MED FILL — CITALOPRAM 20 MG TAB: 20 mg | ORAL | Qty: 2

## 2021-05-28 MED FILL — ACETAMINOPHEN 325 MG TABLET: 325 mg | ORAL | Qty: 2

## 2021-05-28 MED FILL — IPRATROPIUM-ALBUTEROL 2.5 MG-0.5 MG/3 ML NEB SOLUTION: 2.5 mg-0.5 mg/3 ml | RESPIRATORY_TRACT | Qty: 3

## 2021-05-28 MED FILL — BUDESONIDE 0.5 MG/2 ML NEB SUSPENSION: 0.5 mg/2 mL | RESPIRATORY_TRACT | Qty: 1

## 2021-05-28 MED FILL — HYDROCORTISONE 10 MG TAB: 10 mg | ORAL | Qty: 2

## 2021-05-28 MED FILL — PANTOPRAZOLE 40 MG TAB, DELAYED RELEASE: 40 mg | ORAL | Qty: 1

## 2021-05-28 MED FILL — ATORVASTATIN 10 MG TAB: 10 mg | ORAL | Qty: 1

## 2021-05-28 MED FILL — TRAZODONE 50 MG TAB: 50 mg | ORAL | Qty: 2

## 2021-05-28 MED FILL — MIDODRINE 5 MG TAB: 5 mg | ORAL | Qty: 2

## 2021-05-28 MED FILL — CARAFATE 1 GRAM TABLET: 1 gram | ORAL | Qty: 1

## 2021-05-28 MED FILL — LEVOTHYROXINE 150 MCG TAB: 150 mcg | ORAL | Qty: 1

## 2021-05-28 MED FILL — AMIODARONE 200 MG TAB: 200 mg | ORAL | Qty: 2

## 2021-05-28 MED FILL — HEPARIN (PORCINE) 5,000 UNIT/ML IJ SOLN: 5000 unit/mL | INTRAMUSCULAR | Qty: 1

## 2021-05-28 MED FILL — MONTELUKAST 10 MG TAB: 10 mg | ORAL | Qty: 1

## 2021-05-28 MED FILL — ASPIRIN 81 MG CHEWABLE TAB: 81 mg | ORAL | Qty: 1

## 2021-05-28 MED FILL — VENOFER 100 MG IRON/5 ML INTRAVENOUS SOLUTION: 100 mg iron/5 mL | INTRAVENOUS | Qty: 10

## 2021-05-28 MED FILL — RISAQUAD 8 BILLION CELL CAPSULE: 8 billion cell | ORAL | Qty: 1

## 2021-05-28 NOTE — Progress Notes (Signed)
Ultrasound IV by Moses Manners., RN :  Procedure Note    Ultrasound IV education provided to patient. Opportunities for questions given.      Ultrasound used for PIV placement:  20 gauge 6 cm Arrow Endurance Extended Dwell(may stay in place for 29 days)  Left cephalic location.  2 X Attempt(s).    Flushed with ease; vigorous blood return.     Procedure tolerated well. Primary RN aware of IV placement and added to LDA.      Kae Heller, RN

## 2021-05-28 NOTE — Progress Notes (Signed)
Progress Notes by Sharla Kidney, NP at 05/28/21 [phone]                Author: Sharla Kidney, NP  Service: Cardiology  Author Type: Nurse Practitioner       Filed: 05/28/21 0923  Date of Service: 05/28/21 0914  Status: Attested           Editor: Sharla Kidney, NP (Nurse Practitioner)  Cosigner: Rushie Chestnut, MD at 05/28/21 1450          Attestation signed by Rushie Chestnut, MD at 05/28/21 1450          Cardiology Attestation note         SUBJECTIVE:   Reason for encounter: CHF        HPI:          Deborah [name] is a 66 y.o. who has a  PMH of HF p EF, a fib, possible atrial lead vegetation treated abx, repeat ECHO with no evidence of vegetation with COPD, A/C resp failure who was  recently discharged from hospital on 04/24/2021 to [facility] for rehab.        She says that she had not been receiving her Bumex on a consistent basis and missed several days dosing.        She started having a progressively worsening SOB associated with lower extremity swelling. She says she was using oxygen but required supplementation in the ED. She denies any cough or fever. No chills, nausea or vomiting.      Subjective:         Deborah [name] reports breathing is ok. She has no interval cardiac complaints.         Current Facility-Administered Medications      Medication  Dose  Route  Frequency      ?  bumetanide (BUMEX) tablet 1 mg   1 mg  Oral  BID      ?  iron sucrose (VENOFER) injection 200 mg   200 mg  IntraVENous  DAILY      ?  clotrimazole (MYCELEX) 1 % cream 1 Applicator   1 Applicator  Vaginal  QHS      ?  mexiletine (MEXITIL) capsule 150 mg   150 mg  Oral  TID      ?  hydrocortisone (CORTEF) tablet 20 mg   20 mg  Oral  BID WITH MEALS      ?  midodrine (PROAMATINE) tablet 10 mg   10 mg  Oral  TID WITH MEALS      ?  amiodarone (CORDARONE) tablet 400 mg   400 mg  Oral  BID      ?  L.acidophilus-paracasei-S.thermophil-bifidobacter (RISAQUAD) 8 billion cell capsule   1 Capsule  Oral  DAILY      ?   albuterol-ipratropium (DUO-NEB) 2.5 MG-0.5 MG/3 ML   3 mL  Nebulization  Q6H RT      ?  pantoprazole (PROTONIX) tablet 40 mg   40 mg  Oral  ACB      ?  heparin (porcine) injection 5,000 Units   5,000 Units  SubCUTAneous  Q8H      ?  [Held by provider] metoprolol succinate (TOPROL-XL) XL tablet 25 mg   25 mg  Oral  DAILY      ?  traZODone (DESYREL) tablet 100 mg   100 mg  Oral  QHS      ?  oxyCODONE IR (ROXICODONE) tablet 5 mg  5 mg  Oral  Q6H PRN      ?  albuterol-ipratropium (DUO-NEB) 2.5 MG-0.5 MG/3 ML   3 mL  Nebulization  Q4H PRN      ?  insulin lispro (HUMALOG) injection     SubCUTAneous  AC&HS      ?  glucose chewable tablet 16 g   4 Tablet  Oral  PRN      ?  glucagon (GLUCAGEN) injection 1 mg   1 mg  IntraMUSCular  PRN      ?  dextrose 10% infusion 0-250 mL   0-250 mL  IntraVENous  PRN      ?  [Held by provider] arformoteroL (BROVANA) neb solution 15 mcg   15 mcg  Nebulization  BID RT      ?  budesonide (PULMICORT) 500 mcg/2 ml nebulizer suspension   500 mcg  Nebulization  BID RT      ?  sodium chloride (NS) flush 5-40 mL   5-40 mL  IntraVENous  Q8H      ?  sodium chloride (NS) flush 5-40 mL   5-40 mL  IntraVENous  PRN      ?  acetaminophen (TYLENOL) tablet 650 mg   650 mg  Oral  Q6H PRN        Or      ?  acetaminophen (TYLENOL) suppository 650 mg   650 mg  Rectal  Q6H PRN      ?  polyethylene glycol (MIRALAX) packet 17 g   17 g  Oral  DAILY PRN      ?  senna (SENOKOT) tablet 8.6 mg   1 Tablet  Oral  DAILY PRN      ?  promethazine (PHENERGAN) tablet 12.5 mg   12.5 mg  Oral  Q6H PRN        Or      ?  ondansetron (ZOFRAN) injection 4 mg   4 mg  IntraVENous  Q6H PRN      ?  aspirin chewable tablet 81 mg   81 mg  Oral  DAILY      ?  atorvastatin (LIPITOR) tablet 10 mg   10 mg  Oral  DAILY      ?  citalopram (CELEXA) tablet 40 mg   40 mg  Oral  DAILY      ?  [Held by provider] glipiZIDE (GLUCOTROL) tablet 5 mg   5 mg  Oral  BID WITH MEALS      ?  levothyroxine (SYNTHROID) tablet 150 mcg   150 mcg  Oral  ACB       ?  montelukast (SINGULAIR) tablet 10 mg   10 mg  Oral  QHS      ?  sucralfate (CARAFATE) tablet 1 g   1 g  Oral  TID                  Visit Vitals   BP  (!) 107/50 (BP 1 Location: Left lower arm, BP Patient Position: Sitting)      Pulse  80      Temp  98.4 F (36.9 C)      Resp  20      Ht  5\' 7"  (1.702 m)      Wt  336 lb 3.2 oz (152.5 kg)      SpO2  94%      BMI  52.66 kg/m  PE:      General: Well developed, in no acute distress.Morbidly obese and deconditioned   HEENT: No jaundice   Neck: Supple   Heart:  Irregularly regular   Respiratory: Clear bilaterally x 4, no wheezing or rales   Extremities:  tight legs , normal cap refill, no cyanosis.   Musculoskeletal: No clubbing, no deformities   Neuro: A&Ox3, speech clear, cooperative, no focal neurologic deficits   Skin: Skin color is normal. No rashes or lesions. Non diaphoretic            Impression/Recommendation   1.   A/C CHF HFpEF   -po bumex   2. Hx of afib./S/P PPM   -intermittent SVT and bigeminy   -PVC ablation scheduled for soon   3. Obesity   -weight loss is essential    4. OSA      Will see as needed.            I saw and evaluated the patient, performing the key elements of the service.  I discussed the findings, assessment and plan with the NP and agree with the NP's findings and plan as documented in the NPs note.            Rushie Chestnut, MD                                                                                                          [facility] CARDIOLOGY                     Cardiology Care Note       [] Initial Encounter      [x] Follow-up        Patient Name: Deborah [name] - DOB:Aug 31, 1955 - ZOX:[id number]   Primary Cardiologist: [facility] Card[facility] Physicians: Thurston Pounds MD   Consulting Cardiologist: [facility] Physicians: Dr Welton Flakes         Reason for encounter: CHF       HPI:         Deborah [name] is a 66 y.o. female with PMH of HF p EF, a fib, possible atrial lead vegetation treated abx, repeat ECHO with no  evidence of vegetation with COPD, A/C resp failure who was recently discharged from hospital on  04/24/2021 to [facility] for rehab.       She says that she had not been receiving her Bumex on a consistent basis and missed several days dosing.       She started having a progressively worsening SOB associated with lower extremity swelling. She says she was using oxygen but required supplementation in the ED. She denies any cough or fever. No chills, nausea or vomiting.      Subjective:        Deborah [name] reports breathing is ok.          Assessment and Plan          1 A/C HF p EF: seems as if her diuretics were not being administered at SNF. Bumex resumed over  weekend, switch  to PO today so as to avoid BP drops. Repeat TTE w/ EF 60-65%      2 Hx a fib s/p PPM (not MRI compatible)/ s/p AVR: Has had bigeminy and intermittent episodes of SVT with Pacer tracking at 150 bpm - started  on PO amio, initial improvement but now mostly in bigeminy. BB held due to hypotension . Not previously anticoagulated (thrombocytopenia).  No significant AV stenosis on repeat TTE   - Dr. Loma Newton will schedule for PVC ablation in next couple of weeks       3 Bacteremia due to group B Streptococcus: during recent admission, she had GBS bacteremia complicated by pacemaker line vegetation. Continue IV Ceftriaxone completed 05/23/21.       4 Acute on chronic respiratory failure with hypoxia and hypercapnia/COPD: On NC        5 Hypotension: cont midodrine, unclear cause, normal EF on TTE. Weaned off neo gtt. Hydrocortisone started per hospitalist.        6 T 2 DM       7 CKD : stable       8 Obesity: Body mass index is 52.66 kg/m.       9 Thrombocytopenia            ____________________________________________________________      Cardiac testing   05/09/21      ECHO ADULT COMPLETE 05/22/2021 05/22/2021      Interpretation Summary   ?  Contrast used: Definity.   ?  Technical qualifiers: Echo study was technically difficult with poor  endocardial visualization, limited due to patient's condition, limited  due to patient tolerance and technically difficult due to patient's heart rhythm.   ?  Left Ventricle: Normal left ventricular systolic function with a visually estimated EF of 60 - 65%. Left ventricle size is normal. Mildly  increased wall thickness. Unable to assess wall motion.   ?  Aortic Valve: Not well visualized. Bioprosthetic valve. AV mean gradient is 34 mmHg. LVOT:AV VTI Index is 0.38.  With a mean PG 34 mmHg,  DVI 0.38 and an intermediate jet counter, there is possibly some valve stenosis but likely not significant.      Signed by: Laddie Aquas, MD on 05/22/2021 11:13 PM         04/08/21      ECHO ADULT FOLLOW-UP OR LIMITED 04/17/2021 04/17/2021      Interpretation Summary   ?  Aortic Valve: Not well visualized. Edwards bovine bioprosthetic valve with a size of 25 mm. Mild regurgitation.  Stenosis of the aortic valve. AV mean gradient is 38 mmHg. AV area by continuity VTI is 1.0 cm2.   ?  Technical qualifiers: Echo study was technically difficult due to patient's body habitus.      Signed by: Candyce Churn, DO on 04/17/2021  5:00 PM         02/16/21      NUCLEAR CARDIAC STRESS TEST 02/20/2021 02/26/2021      Interpretation Summary   ?  Nuclear Findings: LV perfusion is normal. There is no evidence of inducible ischemia.   ?  Nuclear Findings: The defect appears to be caused by breast attenuation.   ?  ECG: Resting ECG demonstrates normal sinus rhythm.   ?  ECG: Stress ECG was negative for ischemia.   ?  Stress Test: A pharmacological stress test was performed using lexiscan. Hemodynamics are adequate for diagnosis. Blood pressure demonstrated  a normal response and heart rate demonstrated a normal response to stress.  The patient's heart rate recovery was normal. The patient reported dyspnea and no chest pain during the stress test.      Ventricular bigeminy   Unifocal PVC, RBBB and superior axis PVCs      Signed by: Thurston Pounds, MD on  02/20/2021  5:00 PM      Most recent HS troponins:   No results for input(s): TROPHS in the last 72 hours.      No lab exists for component:  CKMB            ECG:      EKG Results                  Procedure  720  Value  Units  Date/Time           EKG, 12 LEAD, INITIAL [[id number]]  Collected: 05/18/21 1916       Order Status: Completed  Updated: 05/19/21 1310                Ventricular Rate  123  BPM           Atrial Rate  57  BPM           QRS Duration  166  ms           Q-T Interval  392  ms           QTC Calculation (Bezet)  561  ms           Calculated R Axis  -73  degrees           Calculated T Axis  108  degrees                Diagnosis  --             Ventricular-paced rhythm   Abnormal ECG   When compared with ECG of 18-May-2021 01:25,   Vent. rate has increased BY   5 BPM   Confirmed by Barry Dienes, M.D., John ([id number]) on 05/19/2021 1:10:36 PM              EKG, 12 LEAD, INITIAL [[id number]]  Collected: 05/18/21 0125       Order Status: Completed  Updated: 05/18/21 1556                Ventricular Rate  118  BPM           Atrial Rate  37  BPM           QRS Duration  172  ms           Q-T Interval  406  ms           QTC Calculation (Bezet)  569  ms           Calculated R Axis  -70  degrees           Calculated T Axis  104  degrees                Diagnosis  --             Ventricular-paced rhythm   Abnormal ECG   Confirmed by Barry Dienes, M.D., John ([id number]) on 05/18/2021 3:56:31 PM              EKG, 12 LEAD, INITIAL [[id number]]  Collected: 05/17/21 2134       Order Status: Completed  Updated: 05/18/21 1553  Ventricular Rate  107  BPM           Atrial Rate  107  BPM           P-R Interval  232  ms           QRS Duration  170  ms           Q-T Interval  362  ms                  QTC Calculation (Bezet)  483  ms                  Calculated R Axis  -71  degrees           Calculated T Axis  110  degrees                Diagnosis  --             Atrial-sensed ventricular-paced rhythm with prolonged AV conduction   Abnormal  ECG   When compared with ECG of 11-May-2021 20:56,   Electronic ventricular pacemaker has replaced Electronic atrial pacemaker   Confirmed by Barry Dienes, M.D., John ([id number]) on 05/18/2021 3:53:20 PM              EKG, 12 LEAD, INITIAL [[id number]]  Collected: 05/11/21 2056       Order Status: Completed  Updated: 05/14/21 1706                Ventricular Rate  84  BPM           Atrial Rate  84  BPM           P-R Interval  168  ms           QRS Duration  162  ms           Q-T Interval  388  ms           QTC Calculation (Bezet)  458  ms           Calculated P Axis  68  degrees           Calculated R Axis  -102  degrees           Calculated T Axis  11  degrees                Diagnosis  --             Sinus rhythm with frequent atrial-paced complexes and premature    supraventricular complexes in a pattern of bigeminy   Right bundle branch block   Septal infarct , age undetermined   T wave abnormality, consider lateral ischemia   Abnormal ECG      Confirmed by Barry Dienes, M.D., John ([id number]) on 05/14/2021 5:06:24 PM              EKG, 12 LEAD, INITIAL [[id number]]  Collected: 05/11/21 2005       Order Status: Completed  Updated: 05/14/21 1706                Ventricular Rate  116  BPM           Atrial Rate  116  BPM           P-R Interval  200  ms           QRS Duration  190  ms           Q-T Interval  368  ms           QTC Calculation (Bezet)  511  ms           Calculated R Axis  -72  degrees           Calculated T Axis  105  degrees                Diagnosis  --             Atrial-sensed ventricular-paced rhythm   Abnormal ECG   Confirmed by Barry Dienes, M.D., John ([id number]) on 05/14/2021 5:06:15 PM              EKG, 12 LEAD, INITIAL [[id number]]  Collected: 05/11/21 1531       Order Status: Completed  Updated: 05/14/21 1706                Ventricular Rate  83  BPM           Atrial Rate  83  BPM           P-R Interval  160  ms           QRS Duration  160  ms           Q-T Interval  368  ms           QTC Calculation (Bezet)  432  ms            Calculated P Axis  64  degrees           Calculated R Axis  -100  degrees           Calculated T Axis  16  degrees                Diagnosis  --             Sinus rhythm with frequent atrial-paced complexes and premature    supraventricular complexes in a pattern of bigeminy   Right bundle branch block   Possible Lateral infarct , age undetermined   Abnormal ECG   Confirmed by Barry Dienes, M.D., John ([id number]) on 05/14/2021 5:06:02 PM              EKG 12 LEAD INITIAL [[id number]]  Collected: 05/10/21 0635       Order Status: Completed  Updated: 05/12/21 1304                Ventricular Rate  80  BPM           Atrial Rate  60  BPM           P-R Interval  212  ms           QRS Duration  112  ms           Q-T Interval  526  ms           QTC Calculation (Bezet)  606  ms           Calculated P Axis  61  degrees           Calculated R Axis  -55  degrees           Calculated T Axis  -158  degrees                Diagnosis  --             Atrial-paced rhythm with prolonged AV conduction with frequent premature  ventricular complexes   Incomplete right bundle branch block   Left anterior fascicular block   Septal infarct , age undetermined   T wave abnormality, consider lateral ischemia   Prolonged QT   Abnormal ECG   When compared with ECG of 09-May-2021 20:16,   No significant change      Confirmed by Rathi MD, Vikas [phone]([id number]) on 05/12/2021 1:04:16 PM              EKG, 12 LEAD, INITIAL [[id number]]         Order Status: Sent         EKG 12 LEAD INITIAL [[id number]][[id number]]  Collected: 05/09/21 2016       Order Status: Completed  Updated: 05/11/21 1349                Ventricular Rate  97  BPM           Atrial Rate  49  BPM           P-R Interval  160  ms           QRS Duration  160  ms           Q-T Interval  350  ms           QTC Calculation (Bezet)  444  ms           Calculated P Axis  67  degrees           Calculated R Axis  -117  degrees           Calculated T Axis  6  degrees                Diagnosis  --             Sinus bradycardia with  marked sinus arrhythmia with frequent atrial-paced    complexes and with frequent and consecutive premature ventricular complexes   Right bundle branch block   Possible Lateral infarct , age undetermined   Abnormal ECG   When compared with ECG of 13-Apr-2021 22:14,   premature ventricular complexes are now present   premature atrial complexes are no longer present   Borderline criteria for Lateral infarct are now present   Confirmed by Rathi MD, Vikas ([id number]) on 05/11/2021 1:49:45 PM                           Review of Systems:      [x] All other systems reviewed and all negative except as written in HPI      []  Patient unable to provide secondary  to condition        Past Medical History:        Diagnosis  Date         ?  (HFpEF) heart failure with preserved ejection fraction (HCC)       ?  Anxiety and depression       ?  Aortic valve replaced            S/p bovine aortic valve replacement.         ?  Asthma       ?  Chronic narcotic use       ?  Chronic obstructive pulmonary disease (HCC)       ?  Chronic pain       ?  CKD (chronic kidney disease), stage III (HCC)  Baseline creatinine is 1.3-1.4 with GFR in the 40s.         ?  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  GERD (gastroesophageal reflux disease)       ?  History of vascular access device  04/13/2021          4 FR Single PICC for LTABX: R cephalic vessell length 48 CM Max P leave @ 1 CM out; Arm circumferenc 40 CM         ?  Hyperlipidemia       ?  Hypothyroidism       ?  Morbid obesity (HCC)       ?  Neuropathy       ?  Obstructive sleep apnea           ?  Rhinitis            Past Surgical History:         Procedure  Laterality  Date          ?  COLONOSCOPY  N/A  12/01/2020          COLONOSCOPY performed by Ian Malkin, MD at [facility] ENDOSCOPY          ?  HX AORTIC VALVE REPLACEMENT              Bovine bioprosthetic          ?  HX PACEMAKER            Social Hx:  reports that she quit smoking about 11 years ago. Her smoking use included  cigarettes. She has a 40.00 pack-year smoking history. She has never used smokeless tobacco. She reports that she does not currently  use alcohol. She reports that she does not use drugs.   Family Hx: family history includes Hypertension in her father and mother.     Allergies        Allergen  Reactions         ?  Nitroglycerin  Unknown (comments)             hypotension         ?  Aloe Vera  Rash     ?  Hydrochlorothiazide  Other (comments)             Reports 'kidneys dry up"          ?  Tetanus And Diphther. Tox (Pf)  Swelling             Swelling of arm and it turns black              OBJECTIVE:     Wt Readings from Last 3 Encounters:        05/28/21  152.5 kg (336 lb 3.2 oz)     04/24/21  (!) 160.8 kg (354 lb 8 oz)        03/09/21  149.7 kg (330 lb)           Intake/Output Summary (Last 24 hours) at 05/28/2021 0921   Last data filed at 05/28/2021 0445     Gross per 24 hour        Intake  480 ml        Output  3575 ml        Net  -3095 ml              Physical Exam:      Vitals:  Vitals:             05/28/21 0538  05/28/21 0600  05/28/21 0630  05/28/21 0830           BP:  95/62  90/66  (!) 108/56  (!) 110/55     Pulse:  80  84  85  81     Resp:        20     Temp:        98.3 F (36.8 C)     SpO2:        94%     Weight:                   Height:                Telemetry: SR, paced       Gen: Morbidly obese, in no distress    Neck: Supple, No JVD, No Carotid Bruit   Resp: No accessory muscle use, diminished breath sounds   Card: Regular Rate,Rythm, Normal S1, S2, No murmurs, rubs or gallop.    Abd:   obese, Soft, non-tender, non-distended, BS+    MSK: No cyanosis   Skin: No rashes     Neuro: Moving all four extremities, follows commands appropriately   Psych: Fair insight, oriented to person, place, alert, Nml Affect   LE: 3+ edema      Data Review:       Radiology:    XR Results (most recent):   Results from Hospital Encounter encounter on 05/09/21      XR CHEST PORT      Narrative   EXAM:  XR CHEST PORT       INDICATION: Evaluate bilateral infiltrates      COMPARISON: 05/17/2021      TECHNIQUE: Upright portable chest AP view      FINDINGS: Median sternotomy changes and left subclavian pacemaker with cardiac   monitoring leads. Cardiomegaly. Pulmonary edema unchanged      The lungs and pleural spaces are clear. The visualized bones and upper abdomen   are age-appropriate.      Impression   Pulmonary edema unchanged.           Recent Labs             05/28/21   0415  05/27/21   0509  05/26/21   0311     NA  137  137  138     K  4.1  4.7  4.1     CL  105  107  108     CO2  29  30  29      BUN  24*  21*  24*     CREA  1.10*  0.95  1.04*     GLU  111*  122*  115*     PHOS   --   3.6  3.4          CA  9.3  9.1  8.8          Recent Labs            05/28/21   0415  05/27/21   0509     WBC  3.8  3.7     HGB  9.2*  8.9*     HCT  31.1*  30.6*         PLT  91*  94*          Recent Labs  05/26/21   0311        AP  37*           No results for input(s): CHOL, LDLC in the last 72 hours.      No lab exists for component: TGL, HDLC,  HBA1C         Current meds:      Current Facility-Administered Medications:    ?  bumetanide (BUMEX) tablet 1 mg, 1 mg, Oral, BID, Britta Louth, Georga Hacking, NP   ?  clotrimazole (MYCELEX) 1 % cream 1 Applicator, 1 Applicator, Vaginal, Rennis Harding, NP, 1 Applicator at 05/27/21 2225   ?  mexiletine (MEXITIL) capsule 150 mg, 150 mg, Oral, TID, Thurston Pounds, MD, 150 mg at 05/27/21 2225   ?  hydrocortisone (CORTEF) tablet 20 mg, 20 mg, Oral, BID WITH MEALS, Do, Khoi B, MD, 20 mg at 05/27/21 1623   ?  PHENYLephrine (NEO-SYNEPHRINE) 50 mg in 0.9% sodium chloride 250 mL (Vial2Bag), 10-100 mcg/min, IntraVENous, TITRATE, Vincent Peyer, NP, Stopped at 05/26/21 0540   ?  midodrine (PROAMATINE) tablet 10 mg, 10 mg, Oral, TID WITH MEALS, Antonino Nienhuis, Georga Hacking, NP, 10 mg at 05/28/21 2637   ?  amiodarone (CORDARONE) tablet 400 mg, 400 mg, Oral, BID, Sherrie Mustache Georga Hacking, NP, 400 mg at 05/28/21 8588   ?   L.acidophilus-paracasei-S.thermophil-bifidobacter (RISAQUAD) 8 billion cell capsule, 1 Capsule, Oral, DAILY, Do, Khoi B, MD, 1 Capsule at 05/28/21 0914   ?  albuterol-ipratropium (DUO-NEB) 2.5 MG-0.5 MG/3 ML, 3 mL, Nebulization, Q6H RT, Bammes, Nathan O, DO, 3 mL at 05/28/21 0305   ?  pantoprazole (PROTONIX) tablet 40 mg, 40 mg, Oral, ACB, Choudhary, Bhavana, MD, 40 mg at 05/28/21 0611   ?  heparin (porcine) injection 5,000 Units, 5,000 Units, SubCUTAneous, Q8H, Tawanna Sat, MD, 5,000 Units at 05/28/21 5027   ?  [Held by provider] metoprolol succinate (TOPROL-XL) XL tablet 25 mg, 25 mg, Oral, DAILY, Sahaj Bona, Georga Hacking, NP, 25 mg at 05/16/21 0847   ?  traZODone (DESYREL) tablet 100 mg, 100 mg, Oral, QHS, Horald Chestnut, DO, 100 mg at 05/27/21 2225   ?  oxyCODONE IR (ROXICODONE) tablet 5 mg, 5 mg, Oral, Q6H PRN, Horald Chestnut, DO, 5 mg at 05/25/21 2238   ?  albuterol-ipratropium (DUO-NEB) 2.5 MG-0.5 MG/3 ML, 3 mL, Nebulization, Q4H PRN, Horald Chestnut, DO, 3 mL at 05/15/21 2003   ?  insulin lispro (HUMALOG) injection, , SubCUTAneous, AC&HS, Horald Chestnut, DO, 2 Units at 05/27/21 1624   ?  glucose chewable tablet 16 g, 4 Tablet, Oral, PRN, Horald Chestnut, DO   ?  glucagon (GLUCAGEN) injection 1 mg, 1 mg, IntraMUSCular, PRN, Jamil, Arna Medici, DO   ?  dextrose 10% infusion 0-250 mL, 0-250 mL, IntraVENous, PRN, Horald Chestnut, DO   ?  [Held by provider] arformoteroL (BROVANA) neb solution 15 mcg, 15 mcg, Nebulization, BID RT, Jamil, Arna Medici, DO, 15 mcg at 05/17/21 0745   ?  budesonide (PULMICORT) 500 mcg/2 ml nebulizer suspension, 500 mcg, Nebulization, BID RT, Jamil, Nora, DO, 500 mcg at 05/27/21 2004   ?  sodium chloride (NS) flush 5-40 mL, 5-40 mL, IntraVENous, Q8H, Melynda Keller, MD, 10 mL at 05/27/21 2226   ?  sodium chloride (NS) flush 5-40 mL, 5-40 mL, IntraVENous, PRN, Melynda Keller, MD   ?  acetaminophen (TYLENOL) tablet 650 mg, 650 mg, Oral, Q6H PRN, 650 mg at 05/28/21 0452 **OR** acetaminophen (TYLENOL) suppository 650 mg,  650 mg, Rectal, Q6H PRN, Lorette Ang,  Gunnar Fusi, MD   ?  polyethylene glycol (MIRALAX) packet 17 g, 17 g, Oral, DAILY PRN, Melynda Keller, MD   ?  senna (SENOKOT) tablet 8.6 mg, 1 Tablet, Oral, DAILY PRN, Melynda Keller, MD, 8.6 mg at 05/19/21 1700   ?  promethazine (PHENERGAN) tablet 12.5 mg, 12.5 mg, Oral, Q6H PRN **OR** ondansetron (ZOFRAN) injection 4 mg, 4 mg, IntraVENous, Q6H PRN, Melynda Keller, MD, 4 mg at 05/17/21 0956   ?  aspirin chewable tablet 81 mg, 81 mg, Oral, DAILY, Melynda Keller, MD, 81 mg at 05/28/21 1610   ?  atorvastatin (LIPITOR) tablet 10 mg, 10 mg, Oral, DAILY, Melynda Keller, MD, 10 mg at 05/28/21 9604   ?  citalopram (CELEXA) tablet 40 mg, 40 mg, Oral, DAILY, Melynda Keller, MD, 40 mg at 05/28/21 0914   ?  [Held by provider] glipiZIDE (GLUCOTROL) tablet 5 mg, 5 mg, Oral, BID WITH MEALS, Melynda Keller, MD, 5 mg at 05/17/21 0805   ?  levothyroxine (SYNTHROID) tablet 150 mcg, 150 mcg, Oral, ACB, Melynda Keller, MD, 150 mcg at 05/28/21 5409   ?  montelukast (SINGULAIR) tablet 10 mg, 10 mg, Oral, QHS, Melynda Keller, MD, 10 mg at 05/27/21 2225   ?  sucralfate (CARAFATE) tablet 1 g, 1 g, Oral, TID, Melynda Keller, MD, 1 g at 05/28/21 0914      Sharla Kidney, NP      [facility]   Call center: (P) [phone]  (F) [phone]         CC:Barrett, Lance Sell, NP

## 2021-05-28 NOTE — Progress Notes (Signed)
Problem: Self Care Deficits Care Plan (Adult)  Goal: *Acute Goals and Plan of Care (Insert Text)  Description: FUNCTIONAL STATUS PRIOR TO ADMISSION: Patient was modified independent using a single point cane for functional mobility. She reports able to care for self prior to recent hospitalization and rehab stay.    HOME SUPPORT: The patient lived with son and daughter in law but did not require assist.    Occupational Therapy Goals  Initiated 05/13/2021, Reviewed for weekly assessment, goals remain appropriate as of 05/28/21  1.  Patient will perform lower body dressing with modified independence within 7 day(s).  2.  Patient will perform lower body dressing with modified independence within 7 day(s).  3.  Patient will perform toilet transfers with modified independence within 7 day(s).  4.  Patient will perform all aspects of toileting with modified independence within 7 day(s).  5.  Patient will participate in upper extremity therapeutic exercise/activities with supervision/set-up for 10 minutes within 7 day(s).    6.  Patient will utilize energy conservation techniques during functional activities with verbal cues within 7 day(s).    Outcome: Progressing Towards Goal   OCCUPATIONAL THERAPY TREATMENT/WEEKLY RE-EVALUATION  Patient: Deborah [name] (66 y.o. female)  Date: 05/28/2021  Diagnosis: Acute on chronic heart failure with preserved ejection fraction (HFpEF) (HCC) [I50.33] Acute on chronic heart failure with preserved ejection fraction (HFpEF) (HCC)      Precautions: Contact, Fall  Chart, occupational therapy assessment, plan of care, and goals were reviewed.    ASSESSMENT  Ms. Mattila was seen for weekly re-assessment.  She is now requiring decreased assistance for most tasks however she continues to be limited in overall activity tolerance d/t low BP and decreased endurance. Patient was received in the chair agreeable to activity.  She performs stand-pivot transfer to/from [facility] for toileting with CGA.  Patient able  to complete all aspects of toileting with up to CGA required and cues for adaptive technique specific to hygiene.  Patient engaged in serial ADLs (grooming, UB/LB dressing + bathing) from seated position with good tolerance.  Patient would benefit from continued skilled OT to progress towards goals and improve overall independence.      Current Level of Function Impacting Discharge (ADLs): Functional mobility, CGA; ADLs, setup-CGA         PLAN :  Goals have been updated based on progression since last assessment. Patient continues to benefit from skilled intervention to address the above impairments. Continue to follow patient 3 times a week to address goals.    Recommend with staff:   Recommend patient be OOB to chair as frequently as tolerated; Goal of 3x/day for all meals for 60 minutes at a time.   For toileting needs, recommend transfers to/from [facility] with close BP monitoring + staff assist.  Encourage patient involvement in personal care as able.    Encourage exercises frequently throughout the day.       Recommend next OT session: Continue towards set OT goals.      Recommendation for discharge: (in order for the patient to meet his/her long term goals)  Occupational therapy at least 2 days/week in the home pending progress     This discharge recommendation:  Has been made in collaboration with the attending provider and/or case management    Equipment recommendations for successful discharge (if) home: none       SUBJECTIVE:   Patient agreeable to OT tx.    OBJECTIVE DATA SUMMARY:   Cognitive/Behavioral Status:  Neurologic State: Alert  Orientation Level: Oriented X4  Cognition: Appropriate decision making  Perception: Appears intact  Perseveration: No perseveration noted  Safety/Judgement: Awareness of environment    Functional Mobility and Transfers for ADLs:  Bed Mobility:  Rolling:  (pt was received up and remained up)  Scooting: Contact guard assistance    Transfers:  Sit to Stand: Contact guard  assistance  Functional Transfers  Toilet Transfer : Contact guard assistance (BSC use)  Bed to Chair: Contact guard assistance    Balance:  Sitting: Intact  Standing: Impaired  Standing - Static: Good  Standing - Dynamic : Fair    ADL Intervention:  Grooming  Grooming Assistance: Set-up  Position Performed: Seated in chair  Washing Face: Set-up  Washing Hands: Modified independent  Brushing Teeth: Set-up  Brushing/Combing Hair: Set-up  Cues: Verbal cues provided    Upper Body Bathing  Bathing Assistance: Set-up    Lower Body Bathing  Bathing Assistance: Contact guard assistance    Upper Body Dressing Assistance  Dressing Assistance: Set-up    Lower Body Dressing Assistance  Dressing Assistance: Minimum assistance  Socks: Minimum assistance    Toileting  Toileting Assistance: Contact guard assistance  Bladder Hygiene: Stand-by assistance  Bowel Hygiene: Stand-by assistance  Clothing Management: Contact guard assistance  Cues: Verbal cues provided    Cognitive Retraining  Safety/Judgement: Awareness of environment      Activity Tolerance:   Fair    After treatment patient left in no apparent distress:   Sitting in chair, Call bell within reach, and Bed / chair alarm activated    COMMUNICATION/COLLABORATION:   The patient's plan of care was discussed with: Physical Therapist, Registered Nurse, and patient    Waunita Schooner, OTR/L  Time Calculation: 41 mins

## 2021-05-28 NOTE — Telephone Encounter (Signed)
-----   Message from Shelle Iron, MD sent at 05/25/2021  4:43 PM EDT -----  She wants PVC ablation [facility]  Will need general anesthesia  I told her it will be months  She has UTI now so that is ok with her

## 2021-05-28 NOTE — Telephone Encounter (Signed)
Tentatively scheduled patient for 08/28/21 at 8:00 am for PVC Ablation at [facility] with general anesthesia.     Attempted to reach patient by telephone. A message was left for return call.

## 2021-05-28 NOTE — Consults (Signed)
Consults  by Seward Speck, MD at 05/28/21 1000                Author: Seward Speck, MD  Service: Hematology  Author Type: Physician       Filed: 05/28/21 1716  Date of Service: 05/28/21 1000  Status: Addendum          Editor: Seward Speck, MD (Physician)          Related Notes: Original Note by Lorelle Gibbs, NP (Nurse Practitioner) filed at 05/28/21  1106            Consult Orders        1. IP CONSULT TO HEMATOLOGY [[id number]] ordered by Cyndra Numbers, MD at 05/28/21 [place] [facility]   [address] [place] [address]   W: [phone]  F: [phone]           Reason for Visit:     Deborah [name] is a 66 y.o. female who is seen for evaluation of thrombocytopenia         Hematology/Oncology Treatment History:     Pt reports seen in [place] / hematology ; for fe def  approx 5-6 yrs ago.         History of Present Illness:     Deborah [name] is a pleasant 66 y.o. female who was admitted on 05/09/21 from rehab for SOB and LE swelling. Pt reported not having  received her Bumex on consistent basis. Recent admission to hospital (04/08/21 to 04/24/21) for bacteremia/possible pacemaker vegetation with source thought to be osteomyelitis of left great toe; followed with by ID and cardiology.  Discharged to rehab  with IV abx (Ceftriaxone IV ) course to completed on 05/23/21. Cardiology has been following. 05/17/21 Rapid response called for AMS/ hypotension/ pt was transferred to ICU; pt required pressor support.  Urine cx with noted E.coli ESBL; ID following    Today. Pt sitting up in the chair at bedside. Reports feeling better. Breathing improved. O2 at 2 L/min. States was seen by Hematology in [place] approx 5-6 yrs ago for anemia; had EGD/Colonoscopy which was "normal"; received IV iron over the course of 6 weeks.  Followed for about 1 yr for labs checks and everything stayed stable. Has never taken Fe pills. Denies any  changes in stools; no noted blood or black tarry stools. Has had blood in urine; seen by urology; now resolved x 6 months. Reports did have nose  bleed in Dec 2022 requiring rocket placement. No nosebleeds since that time.    Relocated to [place] from [place]: Dec 2021    Mammogram: 2 yrs ago / [place]   Family hx of cancer: mother vulva cancer: MGM: ovarian/ breast   Widow: on disability: 3 children; all live in [place].           Past Medical History:        Diagnosis  Date         ?  (HFpEF) heart failure with preserved ejection fraction ([place])       ?  Anxiety and depression       ?  Aortic valve replaced            S/p bovine aortic valve  replacement.         ?  Asthma       ?  Chronic narcotic use       ?  Chronic obstructive pulmonary disease (HCC)       ?  Chronic pain       ?  CKD (chronic kidney disease), stage III (HCC)            Baseline creatinine is 1.3-1.4 with GFR in the 40s.         ?  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  GERD (gastroesophageal reflux disease)       ?  History of vascular access device  04/13/2021          4 FR Single PICC for LTABX: R cephalic vessell length 48 CM Max P leave @ 1 CM out; Arm circumferenc 40 CM         ?  Hyperlipidemia       ?  Hypothyroidism       ?  Morbid obesity ([place])       ?  Neuropathy       ?  Obstructive sleep apnea           ?  Rhinitis               Past Surgical History:         Procedure  Laterality  Date          ?  COLONOSCOPY  N/A  12/01/2020          COLONOSCOPY performed by Rachael Darby, MD at [place]          ?  HX AORTIC VALVE REPLACEMENT              Bovine bioprosthetic          ?  HX PACEMAKER                 Social History          Socioeconomic History         ?  Marital status:  SINGLE              Spouse name:  Not on file         ?  Number of children:  Not on file     ?  Years of education:  Not on file     ?  Highest education level:  Not on file       Occupational History        ?  Not on file       Tobacco Use         ?  Smoking status:   Former              Packs/day:  1.00         Years:  40.00         Pack years:  40.00         Types:  Cigarettes         Quit date:  03/21/2010         Years since quitting:  11.1         ?  Smokeless tobacco:  Never       Vaping Use         ?  Vaping Use:  Never used       Substance and Sexual Activity         ?  Alcohol use:  Not Currently     ?  Drug use:  Never     ?  Sexual activity:  Not on file        Other Topics  Concern        ?  Not on file       Social History Narrative        ?  Not on file          Social Determinants of Health          Financial Resource Strain: Not on file     Food Insecurity: Not on file     Transportation Needs: Not on file     Physical Activity: Not on file     Stress: Not on file     Social Connections: Not on file     Intimate Partner Violence: Not on file       Housing Stability: Not on file             Family History         Problem  Relation  Age of Onset          ?  Hypertension  Mother            ?  Hypertension  Father               Current Facility-Administered Medications          Medication  Dose  Route  Frequency           ?  clotrimazole (MYCELEX) 1 % cream 1 Applicator   1 Applicator  Vaginal  QHS     ?  bumetanide (BUMEX) injection 1 mg   1 mg  IntraVENous  BID     ?  mexiletine (MEXITIL) capsule 150 mg   150 mg  Oral  TID     ?  hydrocortisone (CORTEF) tablet 20 mg   20 mg  Oral  BID WITH MEALS     ?  PHENYLephrine (NEO-SYNEPHRINE) 50 mg in 0.9% sodium chloride 250 mL (Vial2Bag)   10-100 mcg/min  IntraVENous  TITRATE     ?  midodrine (PROAMATINE) tablet 10 mg   10 mg  Oral  TID WITH MEALS     ?  amiodarone (CORDARONE) tablet 400 mg   400 mg  Oral  BID     ?  L.acidophilus-paracasei-S.thermophil-bifidobacter (RISAQUAD) 8 billion cell capsule   1 Capsule  Oral  DAILY     ?  albuterol-ipratropium (DUO-NEB) 2.5 MG-0.5 MG/3 ML   3 mL  Nebulization  Q6H RT     ?  pantoprazole (PROTONIX) tablet 40 mg   40 mg  Oral  ACB     ?  heparin (porcine) injection 5,000 Units   5,000  Units  SubCUTAneous  Q8H     ?  [Held by provider] metoprolol succinate (TOPROL-XL) XL tablet 25 mg   25 mg  Oral  DAILY     ?  [Held by provider] acetaZOLAMIDE (DIAMOX) tablet 250 mg   250 mg  Oral  BID     ?  traZODone (DESYREL) tablet 100 mg   100 mg  Oral  QHS     ?  oxyCODONE IR (ROXICODONE) tablet 5 mg   5 mg  Oral  Q6H PRN     ?  albuterol-ipratropium (DUO-NEB) 2.5 MG-0.5 MG/3 ML   3 mL  Nebulization  Q4H PRN     ?  insulin lispro (HUMALOG) injection     SubCUTAneous  AC&HS     ?  glucose chewable tablet 16 g   4 Tablet  Oral  PRN     ?  glucagon (GLUCAGEN) injection 1 mg   1 mg  IntraMUSCular  PRN     ?  dextrose 10% infusion 0-250 mL   0-250 mL  IntraVENous  PRN     ?  [Held by provider] arformoteroL (BROVANA) neb solution 15 mcg   15 mcg  Nebulization  BID RT     ?  budesonide (PULMICORT) 500 mcg/2 ml nebulizer suspension   500 mcg  Nebulization  BID RT     ?  sodium chloride (NS) flush 5-40 mL   5-40 mL  IntraVENous  Q8H     ?  sodium chloride (NS) flush 5-40 mL   5-40 mL  IntraVENous  PRN     ?  acetaminophen (TYLENOL) tablet 650 mg   650 mg  Oral  Q6H PRN          Or           ?  acetaminophen (TYLENOL) suppository 650 mg   650 mg  Rectal  Q6H PRN     ?  polyethylene glycol (MIRALAX) packet 17 g   17 g  Oral  DAILY PRN     ?  senna (SENOKOT) tablet 8.6 mg   1 Tablet  Oral  DAILY PRN     ?  promethazine (PHENERGAN) tablet 12.5 mg   12.5 mg  Oral  Q6H PRN          Or           ?  ondansetron (ZOFRAN) injection 4 mg   4 mg  IntraVENous  Q6H PRN     ?  aspirin chewable tablet 81 mg   81 mg  Oral  DAILY     ?  atorvastatin (LIPITOR) tablet 10 mg   10 mg  Oral  DAILY     ?  citalopram (CELEXA) tablet 40 mg   40 mg  Oral  DAILY           ?  [Held by provider] glipiZIDE (GLUCOTROL) tablet 5 mg   5 mg  Oral  BID WITH MEALS           ?  levothyroxine (SYNTHROID) tablet 150 mcg   150 mcg  Oral  ACB     ?  montelukast (SINGULAIR) tablet 10 mg   10 mg  Oral  QHS           ?  sucralfate (CARAFATE) tablet 1 g   1 g   Oral  TID             Allergies        Allergen  Reactions         ?  Nitroglycerin  Unknown (comments)             hypotension         ?  Aloe Vera  Rash     ?  Hydrochlorothiazide  Other (comments)             Reports 'kidneys dry up"          ?  Tetanus And Diphther. Tox (Pf)  Swelling             Swelling of arm and it turns black  Review of Systems: A complete review of systems was obtained, reviewed.  Pertinent findings reviewed above.        Physical Exam:     Visit Vitals      BP  (!) 108/56     Pulse  85     Temp  98 F (36.7 C)     Resp  18     Ht  '5\' 7"'  (1.702 m)     Wt  336 lb 3.2 oz (152.5 kg)     SpO2  93%        BMI  52.66 kg/m        ECOG PS: 2-3   General: obese; no distress, obese   Eyes: PERRLA, anicteric sclerae   HENT: atraumatic, poor dentition noted   Neck: supple, no JVD or thyromegaly   Lymphatic: no cervical, supraclavicular, axillary or inguinal adenopathy   Respiratory: CTAB, normal respiratory effort: O2 in use 2L/min   CV:  no murmurs,  irregular rhythm, normal rate; 2+ BLE edema noted    GI: soft, nontender, nondistended,   MS: digits without clubbing or cyanosis.   Skin: no rashes, no ecchymoses, no petechia. normal temperature, turgor, and texture.   Psych: alert, oriented, appropriate affect, normal judgment/insight        Results:          Lab Results         Component  Value  Date/Time            WBC  3.8  05/28/2021 04:15 AM       HGB  9.2 (L)  05/28/2021 04:15 AM       HCT  31.1 (L)  05/28/2021 04:15 AM       PLATELET  91 (L)  05/28/2021 04:15 AM       MCV  94.0  05/28/2021 04:15 AM            ABS. NEUTROPHILS  2.2  05/28/2021 04:15 AM          Lab Results         Component  Value  Date/Time            Sodium  137  05/28/2021 04:15 AM       Potassium  4.1  05/28/2021 04:15 AM       Chloride  105  05/28/2021 04:15 AM       CO2  29  05/28/2021 04:15 AM       Glucose  111 (H)  05/28/2021 04:15 AM       BUN  24 (H)  05/28/2021 04:15 AM       Creatinine  1.10 (H)   05/28/2021 04:15 AM       GFR est AA  56 (L)  08/26/2020 04:25 PM       GFR est non-AA  46 (L)  08/26/2020 04:25 PM       Calcium  9.3  05/28/2021 04:15 AM            Glucose (POC)  189 (H)  05/27/2021 09:04 PM          Lab Results         Component  Value  Date/Time            Bilirubin, total  1.6 (H)  05/26/2021 03:11 AM       ALT (SGPT)  25  05/26/2021 03:11 AM       Alk. phosphatase  37 (L)  05/26/2021  03:11 AM       Protein, total  6.1 (L)  05/26/2021 03:11 AM       Albumin  2.8 (L)  05/26/2021 03:11 AM            Globulin  3.3  05/26/2021 03:11 AM          Lab Results         Component  Value  Date/Time            Iron % saturation  10 (L)  05/21/2021 03:33 AM       TIBC  345  05/21/2021 03:33 AM       Ferritin  38  05/21/2021 03:33 AM       Vitamin B12  403  05/21/2021 03:33 AM       Folate  9.6  05/21/2021 03:33 AM       C-Reactive protein  0.35  05/21/2021 03:33 AM       TSH  0.76  05/10/2021 02:37 AM            Lipase  329  04/29/2020 07:29 PM             Lab Results         Component  Value  Date/Time            INR  1.1  01/24/2021 01:46 PM            D-dimer  2.13 (H)  04/09/2021 09:29 AM           Lab Results         Component  Value  Date/Time            AFP, Serum, Tumor Marker  2.3  11/28/2020 07:34 AM           PLATELET (K/uL)        Date  Value        05/28/2021  91 (L)     05/27/2021  94 (L)     05/24/2021  110 (L)     05/23/2021  90 (L)     05/22/2021  114 (L)     05/21/2021  72 (L)     05/20/2021  85 (L)     05/19/2021  97 (L)     05/18/2021  62 (L)     05/18/2021  68 (L)     05/17/2021  62 (L)     05/17/2021  69 (L)     05/16/2021  75 (L)     05/14/2021  75 (L)     05/13/2021  68 (L)     05/12/2021  85 (L)     05/11/2021  95 (L)     05/10/2021  77 (L)     05/09/2021  83 (L)     04/24/2021  134 (L)     04/23/2021  154     04/22/2021  146 (L)     04/21/2021  150     04/20/2021  135 (L)     04/19/2021  134 (L)     04/18/2021  118 (L)     04/17/2021  133 (L)     04/16/2021  131 (L)     04/15/2021   84 (L)     04/14/2021  77 (L)     04/13/2021  56 (L)     04/12/2021  56 (L)     04/11/2021  52 (L)     04/10/2021  64 (L)  04/09/2021  53 (L)     04/08/2021  57 (L)     01/25/2021  78 (L)     01/24/2021  81 (L)     12/03/2020  91 (L)     12/02/2020  90 (L)     11/30/2020  84 (L)     11/29/2020  67 (L)     11/28/2020  57 (L)     11/27/2020  75 (L)     11/05/2020  71 (L)     08/26/2020  77 (L)        04/29/2020  98 (L)         04/12/21 Peripheral smear   Peripheral blood, smear:        RBC's:          Normocytic normochromic RBC's.        WBC's:     Unremarkable WBC's.        Platelets:     Moderate  thrombocytopenia; no platelet clumps seen.       Test results above have been reviewed.           Assessment/Recommendations:      66 yo female with PMhx of HTN, DM, CHF, PVCs s/p pacer, bacteremia admitted with hypoxia, hypotension.       1.  Thrombocytopenia   Chronic. Peripheral smear unrevealing. No folate / B12 def noted   Likely multifactorial due to splenomegaly from CHF and 2 recent infections (GBS bacteremia - completed Ceftriaxone course 4/26 and more recent ESBL E. coli UTI - completed Meropenem on 4/29)    -- Checking fibrinogen and gammopathy panel.       2. Normocytic anemia due to iron deficiency and anemia of chronic disease:   Ferritin 38 with % Fe 10. Received Venofer 248m x 1 on 05/22/21.    -- Checking hemoccult stool and gammopathy panel   -- Adding additional Venofer 200 mg IV daily x 2 doses   -- Cont to monitor and transfuse for Hgb < 7       3. E. Coli UTI:    Completed course of Meropenem on 4/29. ID following.      4. CHF: cardiology following and managing diuretics       5. Acute on chronic resp failure : 2/2 CHF; pulmonary following       6. Debility: PT/OT following       I personally saw and evaluated the patient and performed the key components of medical decision making.  The history, physical exam, and documentation were performed by ALorelle Gibbs NP.  I reviewed  and verified the  above documentation and modified it as needed. Pt's chronic thrombocytopenia is likely due to splenomegaly and splenic sequestration which is a 2ndary effect of congestion and volume overload from CHF. Anemia is due to iron deficiency  and recent infections, mild CKD.         Signed By:  RSeward Speck MD

## 2021-05-28 NOTE — Progress Notes (Signed)
[facility]     Name: Deborah [name] MRN: [id number]   DOB: 01/18/1956 Hospital: [facility]   Date: 05/28/2021        Impression Plan   Acute respiratory failure  Hypoxia  Hypercapnea  Pulmonary infiltrates  ESBL UTI  OSA  Morbid obesity               Wean O2 to keep sats above 90%  Continue BiPAP nightly and when napping  Diuresis as BP allows.   Continue midodrine  Duonebs  Continue meropenem for ESBL UTI  OOB into chair  PT/OT as BP allows  Needs outpatient sleep eval appt with CPAP interrogation            Radiology  (personally reviewed) CXR4/20/23: basilar infiltrates-overall improving       Subjective     Cc: shortness of breath    66 yo with PMHx diastolic CHF, recent strep bacteremia and OSA presenting with increasing SOB. Found to have increased leg swelling and increased infiltrates after missing several doses of diuretics at rehab center. Clinical scenario complicated further by ESBL UTI and septic shock. Pt has hx of mild asthma, denies COPD. Smoked 1 ppd for 25 years, quit over 10 years ago.     Interval history  Afebrile  BP soft/stable--off Neo  Sats 94% on 2L NC; wore Bipap overnight  Bicarb 29 - trending down  Creat 1.1  4/29 proBNP 2300- down from 2978   Blood cx no growth x 5 days - final  3575 UOP    4/25 CXR: Pulmonary edema unchanged    4/25 ECHO: EF 60-65%; technically difficult with poor endocardial visualization    Review of Systems:  Feels pretty good today.  Used BiPap last night.  No cough, sputum production.        A comprehensive review of systems was negative except for that written in the HPI.    Past Medical History:   Diagnosis Date    (HFpEF) heart failure with preserved ejection fraction (HCC)     Anxiety and depression     Aortic valve replaced     S/p bovine aortic valve replacement.    Asthma     Chronic narcotic use     Chronic obstructive pulmonary disease (HCC)     Chronic pain     CKD (chronic kidney disease), stage III (HCC)     Baseline  creatinine is 1.3-1.4 with GFR in the 40s.    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)     GERD (gastroesophageal reflux disease)     History of vascular access device 04/13/2021    4 FR Single PICC for LTABX: R cephalic vessell length 48 CM Max P leave @ 1 CM out; Arm circumferenc 40 CM    Hyperlipidemia     Hypothyroidism     Morbid obesity (HCC)     Neuropathy     Obstructive sleep apnea     Rhinitis       Past Surgical History:   Procedure Laterality Date    COLONOSCOPY N/A 12/01/2020    COLONOSCOPY performed by Ian Malkin, MD at [facility] ENDOSCOPY    HX AORTIC VALVE REPLACEMENT      Bovine bioprosthetic    HX PACEMAKER        Prior to Admission medications    Medication Sig Start Date End Date Taking? Authorizing Provider   bumetanide (BUMEX) 1 mg tablet Take 1 Tablet by mouth daily.  04/25/21  Yes Horald Chestnut, DO   lidocaine 4 % patch Apply to low back  Apply patch to the affected area for 12 hours a day and remove for 12 hours a day. 04/24/21  Yes Horald Chestnut, DO   arformoteroL (BROVANA) 15 mcg/2 mL nebu neb solution 2 mL by Nebulization route two (2) times a day. 04/24/21  Yes Horald Chestnut, DO   miconazole (MICOTIN) 2 % topical powder Apply  to affected area two (2) times a day. APPLY TO bilateral breasts    Nursing, document site in comments 04/24/21  Yes Jamil, Arna Medici, DO   atorvastatin (LIPITOR) 10 mg tablet Take 1 Tablet by mouth daily.   Yes Provider, Historical   metoprolol succinate (TOPROL-XL) 25 mg XL tablet Take 1 Tablet by mouth daily. 02/21/21  Yes Thurston Pounds, MD   levothyroxine (SYNTHROID) 150 mcg tablet Take 1 Tablet by mouth Daily (before breakfast). 01/27/21  Yes Tefera, Mesfin, MD   aspirin 81 mg chewable tablet Take 1 Tablet by mouth daily. 12/03/20  Yes Carrolyn Meiers, MD   acetaminophen (TYLENOL) 500 mg tablet Take 2 Tablets by mouth every six (6) hours as needed for Pain.   Yes Provider, Historical   albuterol (PROVENTIL HFA, VENTOLIN HFA, PROAIR HFA) 90 mcg/actuation inhaler Take 2 Puffs by inhalation  every six (6) hours as needed.   Yes Provider, Historical   citalopram (CELEXA) 40 mg tablet Take 1 Tablet by mouth daily. 02/22/20  Yes Provider, Historical   fluticasone propionate (FLONASE) 50 mcg/actuation nasal spray 2 Sprays by Nasal route daily as needed.   Yes Provider, Historical   sucralfate (CARAFATE) 1 gram tablet Take 1 Tablet by mouth three (3) times daily.   Yes Provider, Historical   traZODone (DESYREL) 100 mg tablet Take 1 Tablet by mouth nightly. 12/31/19  Yes Provider, Historical   glimepiride (AMARYL) 2 mg tablet Take 1 Tablet by mouth two (2) times a day.   Yes Provider, Historical   montelukast (SINGULAIR) 10 mg tablet Take 1 Tablet by mouth nightly.   Yes Provider, Historical   naloxone (Narcan) 4 mg/actuation nasal spray Use 1 spray intranasally, then discard. Repeat with new spray every 2 min as needed for opioid overdose symptoms, alternating nostrils. 04/24/21   Horald Chestnut, DO     Current Facility-Administered Medications   Medication Dose Route Frequency    clotrimazole (MYCELEX) 1 % cream 1 Applicator  1 Applicator Vaginal QHS    bumetanide (BUMEX) injection 1 mg  1 mg IntraVENous BID    mexiletine (MEXITIL) capsule 150 mg  150 mg Oral TID    hydrocortisone (CORTEF) tablet 20 mg  20 mg Oral BID WITH MEALS    PHENYLephrine (NEO-SYNEPHRINE) 50 mg in 0.9% sodium chloride 250 mL (Vial2Bag)  10-100 mcg/min IntraVENous TITRATE    midodrine (PROAMATINE) tablet 10 mg  10 mg Oral TID WITH MEALS    amiodarone (CORDARONE) tablet 400 mg  400 mg Oral BID    L.acidophilus-paracasei-S.thermophil-bifidobacter (RISAQUAD) 8 billion cell capsule  1 Capsule Oral DAILY    albuterol-ipratropium (DUO-NEB) 2.5 MG-0.5 MG/3 ML  3 mL Nebulization Q6H RT    pantoprazole (PROTONIX) tablet 40 mg  40 mg Oral ACB    heparin (porcine) injection 5,000 Units  5,000 Units SubCUTAneous Q8H    [Held by provider] metoprolol succinate (TOPROL-XL) XL tablet 25 mg  25 mg Oral DAILY    [Held by provider] acetaZOLAMIDE (DIAMOX)  tablet 250 mg  250 mg Oral BID    traZODone (  DESYREL) tablet 100 mg  100 mg Oral QHS    insulin lispro (HUMALOG) injection   SubCUTAneous AC&HS    [Held by provider] arformoteroL (BROVANA) neb solution 15 mcg  15 mcg Nebulization BID RT    budesonide (PULMICORT) 500 mcg/2 ml nebulizer suspension  500 mcg Nebulization BID RT    sodium chloride (NS) flush 5-40 mL  5-40 mL IntraVENous Q8H    aspirin chewable tablet 81 mg  81 mg Oral DAILY    atorvastatin (LIPITOR) tablet 10 mg  10 mg Oral DAILY    citalopram (CELEXA) tablet 40 mg  40 mg Oral DAILY    [Held by provider] glipiZIDE (GLUCOTROL) tablet 5 mg  5 mg Oral BID WITH MEALS    levothyroxine (SYNTHROID) tablet 150 mcg  150 mcg Oral ACB    montelukast (SINGULAIR) tablet 10 mg  10 mg Oral QHS    sucralfate (CARAFATE) tablet 1 g  1 g Oral TID     Allergies   Allergen Reactions    Nitroglycerin Unknown (comments)     hypotension    Aloe Vera Rash    Hydrochlorothiazide Other (comments)     Reports 'kidneys dry up"     Tetanus And Diphther. Tox (Pf) Swelling     Swelling of arm and it turns black      Social History     Tobacco Use    Smoking status: Former     Packs/day: 1.00     Years: 40.00     Pack years: 40.00     Types: Cigarettes     Quit date: 03/21/2010     Years since quitting: 11.1    Smokeless tobacco: Never   Substance Use Topics    Alcohol use: Not Currently      Family History   Problem Relation Age of Onset    Hypertension Mother     Hypertension Father           Laboratory: I have personally reviewed the flowsheet and labs.     Recent Labs     05/28/21  0415 05/27/21  0509   WBC 3.8 3.7   HGB 9.2* 8.9*   HCT 31.1* 30.6*   PLT 91* 94*       Recent Labs     05/28/21  0415 05/27/21  0509 05/26/21  0311   NA 137 137 138   K 4.1 4.7 4.1   CL 105 107 108   CO2 29 30 29    GLU 111* 122* 115*   BUN 24* 21* 24*   CREA 1.10* 0.95 1.04*   CA 9.3 9.1 8.8   MG  --  2.0 2.1   PHOS  --  3.6 3.4   ALB  --   --  2.8*   ALT  --   --  25         Objective:   Visit Vitals  BP  (!) 110/55 (BP 1 Location: Left lower arm, BP Patient Position: At rest)   Pulse 81   Temp 98.3 F (36.8 C)   Resp 20   Ht 5\' 7"  (1.702 m)   Wt 152.5 kg (336 lb 3.2 oz)   SpO2 94%   BMI 52.66 kg/m       Intake/Output Summary (Last 24 hours) at 05/28/2021 0836  Last data filed at 05/28/2021 0445  Gross per 24 hour   Intake 480 ml   Output 3575 ml   Net -3095 ml  EXAM:   GENERAL: sleepy but wakes easily, alert, obese HEENT:  anicteric, EOMI, no alar flaring or epistaxis, oral mucosa moist without cyanosis, NECK:  no jugular vein distention, no retractions, no thyromegaly or masses, LUNGS: distant, CTA, no w/r/rHEART:  Regular rate and rhythm with no MGR; +2 edema is present in LE, ABDOMEN:  soft with no tenderness, EXTREMITIES:  warm with no cyanosis, SKIN:  no jaundice or ecchymosis, and NEUROLOGIC:  alert and oriented, grossly non-focal    Lavonne Chick, NP  [facility]

## 2021-05-28 NOTE — Progress Notes (Signed)
Bedside and Verbal shift change report given to [facility] (Cabin crew) by Shanda Bumps (offgoing nurse). Report included the following information SBAR, Kardex, ED Summary, Procedure Summary, Intake/Output, MAR, Recent Results, and Cardiac Rhythm AV paced .       Bedside and Verbal shift change report given to [facility] (Cabin crew) by Wilmon Pali (offgoing nurse). Report included the following information SBAR, Kardex, ED Summary, Intake/Output, MAR, Recent Results, and Cardiac Rhythm Av paced .

## 2021-05-28 NOTE — Progress Notes (Signed)
Progress Notes by Kathi Simpers, RD at 05/28/21 2003                Author: Kathi Simpers, RD  Service: Registered Dietitian or Nutrition Professional  Author Type: Registered Dietitian       Filed: 05/28/21 2005  Date of Service: 05/28/21 2003  Status: Signed          Editor: Kathi Simpers, RD (Registered Dietitian)               Nutrition Note      On the unit seeing another patient, when I was asked to check on Ms. Davanzo. This RD familiar with patient. She tried [employer] earlier today and did not like it. Diet order adjusted to only provide Ensure pudding for HS snack.       Electronically signed by Kathi Simpers, RD, MS on 05/28/2021 at 8:03 PM   Contact via Perfect Serve or office [phone]

## 2021-05-28 NOTE — Progress Notes (Signed)
Comprehensive Nutrition Assessment    Type and Reason for Visit: Reassess    Nutrition Recommendations/Plan:   Continue Regular, 4 Carb Choice, 2 gm Na diet order  FR 1500 per MD  Provide Gelatein once daily to increase kcal/protein intake (80 kcal, <1 g carbs, 20 g protein)   Provide Ensure Pudding once daily to aid in kcal/protein intake (240 kcal, 24 g carbs, 12 g protein)     Malnutrition Assessment:  Malnutrition Status:  Mild malnutrition (05/21/21 1221)    Context:  Chronic illness     Findings of the 6 clinical characteristics of malnutrition:   Energy Intake:  Mild decrease in energy intake (specify)  Weight Loss:  No significant weight loss     Body Fat Loss:  No significant body fat loss,     Muscle Mass Loss:  No significant muscle mass loss,    Fluid Accumulation:  Severe, Generalized, Extremities  Grip Strength:  Not performed     Nutrition Assessment:      5/1: Follow up. Intake continues to be adequate. BG have been 120-189 mg/dL. A1c 5.7. Pt with insulin resistance and while pt is hospitalized with less daily movement/activities, BG may be more elevated. Off steroids now. Adjust medications to best control BG. Abx are done and PICC line to be removed. Heme onc now following. Currently on 2 l/min.       4/24: Follow up. Per documentation, patient continues to have good appetite with PO intake averaging 75% of most meals. No ONS intake has been documented. Edema has improved, and weight trend reflects this. Noted ID consult. Off pressors at this time. Patient voices hunger; displeased she is on carbohydrate restriction. Discussed with patient blood sugars 2/2 prednisone, patient voiced understanding. Will liberalize to 4 carb choice diet in order to provide sufficient calories to patient. Doing "alright" with ONS intake per her report.       Documented Meal intake:  Patient Vitals for the past 168 hrs:   % Diet Eaten   05/27/21 1123 76 - 100%   05/24/21 1022 76 - 100%         Documentation of  supplement intake:  Patient Vitals for the past 168 hrs:   Supplement intake %   05/27/21 1123 0%           Last 3 Recorded Weights in this Encounter    05/24/21 0312 05/25/21 0305 05/28/21 0356   Weight: 154.2 kg (339 lb 14.4 oz) 154.5 kg (340 lb 9.6 oz) 152.5 kg (336 lb 3.2 oz)       Nutrition Related Findings:      Wound Type: None (none noted on flowsheets)  Last Bowel Movement Date: 05/27/21  Stool Appearance: Formed, Hard  Abdominal Assessment: Obese  Appetite: Good  Bowel Sounds: Active   Edema:LLE: 2+ (05/28/2021  8:00 AM)  RLE: 2+ (05/28/2021  8:00 AM)      Nutr. Labs:  Lab Results   Component Value Date/Time    GFR est AA 56 (L) 08/26/2020 04:25 PM    GFR est non-AA 46 (L) 08/26/2020 04:25 PM    Creatinine 1.10 (H) 05/28/2021 04:15 AM    BUN 24 (H) 05/28/2021 04:15 AM    Sodium 137 05/28/2021 04:15 AM    Potassium 4.1 05/28/2021 04:15 AM    Chloride 105 05/28/2021 04:15 AM    CO2 29 05/28/2021 04:15 AM       Lab Results   Component Value Date/Time    Glucose 111 (  H) 05/28/2021 04:15 AM    Glucose (POC) 129 (H) 05/28/2021 11:39 AM       Lab Results   Component Value Date/Time    Hemoglobin A1c 5.7 (H) 05/21/2021 03:33 AM    Hemoglobin A1c, External 6.1 07/15/2020 12:00 AM         Meds:  Current Facility-Administered Medications   Medication Dose Route Frequency    bumetanide (BUMEX) tablet 1 mg  1 mg Oral BID    iron sucrose (VENOFER) injection 200 mg  200 mg IntraVENous DAILY    clotrimazole (MYCELEX) 1 % cream 1 Applicator  1 Applicator Vaginal QHS    mexiletine (MEXITIL) capsule 150 mg  150 mg Oral TID    hydrocortisone (CORTEF) tablet 20 mg  20 mg Oral BID WITH MEALS    midodrine (PROAMATINE) tablet 10 mg  10 mg Oral TID WITH MEALS    amiodarone (CORDARONE) tablet 400 mg  400 mg Oral BID    L.acidophilus-paracasei-S.thermophil-bifidobacter (RISAQUAD) 8 billion cell capsule  1 Capsule Oral DAILY    albuterol-ipratropium (DUO-NEB) 2.5 MG-0.5 MG/3 ML  3 mL Nebulization Q6H RT    pantoprazole (PROTONIX) tablet  40 mg  40 mg Oral ACB    heparin (porcine) injection 5,000 Units  5,000 Units SubCUTAneous Q8H    [Held by provider] metoprolol succinate (TOPROL-XL) XL tablet 25 mg  25 mg Oral DAILY    traZODone (DESYREL) tablet 100 mg  100 mg Oral QHS    insulin lispro (HUMALOG) injection   SubCUTAneous AC&HS    [Held by provider] arformoteroL (BROVANA) neb solution 15 mcg  15 mcg Nebulization BID RT    budesonide (PULMICORT) 500 mcg/2 ml nebulizer suspension  500 mcg Nebulization BID RT    sodium chloride (NS) flush 5-40 mL  5-40 mL IntraVENous Q8H    aspirin chewable tablet 81 mg  81 mg Oral DAILY    atorvastatin (LIPITOR) tablet 10 mg  10 mg Oral DAILY    citalopram (CELEXA) tablet 40 mg  40 mg Oral DAILY    [Held by provider] glipiZIDE (GLUCOTROL) tablet 5 mg  5 mg Oral BID WITH MEALS    levothyroxine (SYNTHROID) tablet 150 mcg  150 mcg Oral ACB    montelukast (SINGULAIR) tablet 10 mg  10 mg Oral QHS    sucralfate (CARAFATE) tablet 1 g  1 g Oral TID           Current Nutrition Intake & Therapies:  Average Meal Intake: 51-75%  Average Supplement Intake: 26-50%  ADULT ORAL NUTRITION SUPPLEMENT Lunch; Protein Modular  ADULT ORAL NUTRITION SUPPLEMENT HS Snack; Fortified Pudding  ADULT DIET Regular; 4 carb choices (60 gm/meal); Low Sodium (2 gm); 1500 ml      Anthropometric Measures:  Height: '5\' 7"'  (170.2 cm)  Ideal Body Weight (IBW): 135 lbs (61 kg)  Admission Body Weight: 354 lb  Current Body Wt:  155.6 kg (343 lb 0.6 oz), 254.1 % IBW. Standing scale  Current BMI (kg/m2): 53.7  Usual Body Weight: 145.2 kg (320 lb)  % Weight Change (Calculated): 11.2  Weight Adjustment: No adjustment                 BMI Category: Obese class 3 (BMI 40.0 or greater)    Estimated Daily Nutrient Needs:  Energy Requirements Based On: Formula  Weight Used for Energy Requirements: Current  Energy (kcal/day): 2354 (MSJ x 1.1)  Weight Used for Protein Requirements: Ideal  Protein (g/day): 92 - 122 g (1.5-2 g/kg of  IBW)  Method Used for Fluid  Requirements: 1 ml/kcal  Fluid (ml/day): 9373    Nutrition Diagnosis:   Inadequate protein intake related to biting/chewing (masticatory) difficulty, altered taste perception as evidenced by localized or generalized fluid accumulation, lab values, poor dentition    Nutrition Interventions:   Food and/or Nutrient Delivery: Modify current diet, Continue oral nutrition supplement  Nutrition Education/Counseling: Survival skills/brief education completed  Coordination of Nutrition Care: Continue to monitor while inpatient, Interdisciplinary rounds  Plan of Care discussed with: IDR team    Goals:  Previous Goal Met: Goal(s) achieved  Goals: Meet at least 75% of estimated needs, by next RD assessment       Nutrition Monitoring and Evaluation:   Behavioral-Environmental Outcomes: None identified  Food/Nutrient Intake Outcomes: Food and nutrient intake, Supplement intake  Physical Signs/Symptoms Outcomes: Biochemical data, Weight, Fluid status or edema    Discharge Planning:    Continue oral nutrition supplement    Ma Rings, RD  Contact: Ext: [phone], or via PerfectServe

## 2021-05-28 NOTE — Telephone Encounter (Signed)
Verified patient with two types of identifiers. Scheduled patient for EPS & PVC Ablation on 08/30/21 at 1:00 pm. Will send mychart message with pre procedure instructions. Patient verbalized understanding and will call with any other questions.      Future Appointments   Date Time Provider Department Center   06/07/2021  2:40 PM PACEMAKER, [facility]   03/13/2022  2:00 PM PACEMAKER, [facility]   03/13/2022  2:20 PM Thurston Pounds, MD [facility]

## 2021-05-28 NOTE — Progress Notes (Signed)
Transition of Care Plan: RUR -29%, LOS 19 days  Medical management continues  PT/OT following - hh v snf recommended (pt prefers hh)  Hematolgy following  Cardiology following--back to diuresis  Currently on 2L O2 nc; bipap at night--pulmonology following  Needs a new cpap /bipap machine & needs a sleep study  CM following- patient is a readmission.  She lives with her son and daughter-in-law.  HH has been arranged with [facility].  L.Neely, RN

## 2021-05-28 NOTE — Telephone Encounter (Signed)
Pt returned a call to the nurse.           Pt# [phone]

## 2021-05-28 NOTE — Progress Notes (Signed)
Progress  Notes by Otho Darnerefera, Shabria Egley, MD at 05/28/21 0744                Author: Otho Darnerefera, Deborah Vittorio, MD  Service: Internal Medicine  Author Type: Physician       Filed: 05/28/21 0751  Date of Service: 05/28/21 0744  Status: Signed          Editor: Otho Darnerefera, Dinora Hemm, MD (Physician)                [facility]   [address], [place], [place] [id number]   [phone]         Hospitalist Progress Note         NAME: Deborah LoftDonna Fewell    DOB:  1955/11/16   MRM:  [id number]      Date of service: 05/28/2021  8:51 AM               Assessment and Plan:     1.  ESBL E. Coli UTI/recent group B strep bacteremia: completed IV CTX on 4/26. Finished IV meropenem 04/29         2.  Hypotension: refractory, but seems improving.  Unclear etiology.  Unlikely sepsis.  Cont to hold all diuretics.  On hydrocortisone and midodrine. Echo with EF 60-65%. off pressors gtt       3.   Acute resp failure with hypoxia/hypercapnea: due to CHF, OSA, obesity hypoventilation. cont supplement O2, diamox prn, bipap.  Pulm  following       4.  Acute on chronic dCHF: EF 60-65. Back on bumex, which was held due to hypotension.  Card iology following       5.   PVCs/bioprosthetic AVR: s/p pacer.  Cont ASA, statin. Evaluated by [facility] cardiology  and [facility]       6.  Anemia/thrombocytopenia: this is chronic. S/p IV iron.  Monitor CBC. Consult hematology        7.  DM type 2: A1C 5.7%.  Cont SSI       8.  Weakness: cont PT/OT.  Will need HH       9.  Morbid obesity/OSA. Would benefit from weight loss. Cont CPAP                  Subjective:        Chief Complaint:: Patient was seen and examined as a follow up for UTI.  Chart was reviewed.  thought BL leg swelling is better after bumex  started       ROS:   (bold if positive,  if negative)      Tolerating PT  Tolerating Diet                Objective:        Last 24hrs VS reviewed since prior progress note. Most recent are:      Visit Vitals      BP  (!) 108/56      Pulse  85     Temp  98 F (36.7 C)     Resp  18     Ht  5\' 7"  (1.702 m)     Wt  152.5 kg (336 lb 3.2 oz)     SpO2  93%        BMI  52.66 kg/m        SpO2 Readings from Last 6 Encounters:      05/28/21  93%  04/24/21  97%      03/09/21  96%      01/26/21  90%      12/03/20  96%      11/05/20  92%           O2 Flow Rate (L/min): 2 l/min        Intake/Output Summary (Last 24 hours) at 05/28/2021 0744   Last data filed at 05/28/2021 0445     Gross per 24 hour        Intake  480 ml        Output  3575 ml        Net  -3095 ml               Physical Exam:      Gen:  obese, in no acute distress   HEENT:  Pink conjunctivae, PERRL, hearing intact to voice, moist mucous membranes   Neck:  Supple, without masses, thyroid non-tender   Resp:  No accessory muscle use, clear breath sounds without wheezes rales or rhonchi   Card:  No murmurs, normal S1, S2 without thrills, bruits. +++ peripheral edema   Abd:  Soft, non-tender, non-distended, normoactive bowel sounds are present, no palpable organomegaly and no detectable hernias   Lymph:  No cervical or inguinal adenopathy   Musc:  No cyanosis or clubbing   Skin:  No rashes or ulcers, skin turgor is good   Neuro:  Cranial nerves are grossly intact, no focal motor weakness, follows commands appropriately   Psych:  Good insight, oriented to person, place and time, alert   __________________________________________________________________   Medications Reviewed: (see below)     Medications:          Current Facility-Administered Medications          Medication  Dose  Route  Frequency           ?  clotrimazole (MYCELEX) 1 % cream 1 Applicator   1 Applicator  Vaginal  QHS     ?  bumetanide (BUMEX) injection 1 mg   1 mg  IntraVENous  BID     ?  mexiletine (MEXITIL) capsule 150 mg   150 mg  Oral  TID     ?  hydrocortisone (CORTEF) tablet 20 mg   20 mg  Oral  BID WITH MEALS     ?  PHENYLephrine (NEO-SYNEPHRINE) 50 mg in 0.9% sodium chloride 250 mL (Vial2Bag)   10-100 mcg/min  IntraVENous   TITRATE     ?  midodrine (PROAMATINE) tablet 10 mg   10 mg  Oral  TID WITH MEALS     ?  amiodarone (CORDARONE) tablet 400 mg   400 mg  Oral  BID     ?  L.acidophilus-paracasei-S.thermophil-bifidobacter (RISAQUAD) 8 billion cell capsule   1 Capsule  Oral  DAILY     ?  albuterol-ipratropium (DUO-NEB) 2.5 MG-0.5 MG/3 ML   3 mL  Nebulization  Q6H RT     ?  pantoprazole (PROTONIX) tablet 40 mg   40 mg  Oral  ACB     ?  heparin (porcine) injection 5,000 Units   5,000 Units  SubCUTAneous  Q8H     ?  [Held by provider] metoprolol succinate (TOPROL-XL) XL tablet 25 mg   25 mg  Oral  DAILY     ?  [Held by provider] acetaZOLAMIDE (DIAMOX) tablet 250 mg   250 mg  Oral  BID     ?  traZODone (  DESYREL) tablet 100 mg   100 mg  Oral  QHS     ?  oxyCODONE IR (ROXICODONE) tablet 5 mg   5 mg  Oral  Q6H PRN     ?  albuterol-ipratropium (DUO-NEB) 2.5 MG-0.5 MG/3 ML   3 mL  Nebulization  Q4H PRN           ?  insulin lispro (HUMALOG) injection     SubCUTAneous  AC&HS           ?  glucose chewable tablet 16 g   4 Tablet  Oral  PRN     ?  glucagon (GLUCAGEN) injection 1 mg   1 mg  IntraMUSCular  PRN     ?  dextrose 10% infusion 0-250 mL   0-250 mL  IntraVENous  PRN     ?  [Held by provider] arformoteroL (BROVANA) neb solution 15 mcg   15 mcg  Nebulization  BID RT     ?  budesonide (PULMICORT) 500 mcg/2 ml nebulizer suspension   500 mcg  Nebulization  BID RT     ?  sodium chloride (NS) flush 5-40 mL   5-40 mL  IntraVENous  Q8H     ?  sodium chloride (NS) flush 5-40 mL   5-40 mL  IntraVENous  PRN     ?  acetaminophen (TYLENOL) tablet 650 mg   650 mg  Oral  Q6H PRN          Or           ?  acetaminophen (TYLENOL) suppository 650 mg   650 mg  Rectal  Q6H PRN     ?  polyethylene glycol (MIRALAX) packet 17 g   17 g  Oral  DAILY PRN     ?  senna (SENOKOT) tablet 8.6 mg   1 Tablet  Oral  DAILY PRN     ?  promethazine (PHENERGAN) tablet 12.5 mg   12.5 mg  Oral  Q6H PRN          Or           ?  ondansetron (ZOFRAN) injection 4 mg   4 mg  IntraVENous   Q6H PRN     ?  aspirin chewable tablet 81 mg   81 mg  Oral  DAILY     ?  atorvastatin (LIPITOR) tablet 10 mg   10 mg  Oral  DAILY     ?  citalopram (CELEXA) tablet 40 mg   40 mg  Oral  DAILY     ?  [Held by provider] glipiZIDE (GLUCOTROL) tablet 5 mg   5 mg  Oral  BID WITH MEALS     ?  levothyroxine (SYNTHROID) tablet 150 mcg   150 mcg  Oral  ACB     ?  montelukast (SINGULAIR) tablet 10 mg   10 mg  Oral  QHS           ?  sucralfate (CARAFATE) tablet 1 g   1 g  Oral  TID            Lab Data Reviewed: (see below)     Lab Review:          Recent Labs            05/28/21   0415  05/27/21   0509     WBC  3.8  3.7     HGB  9.2*  8.9*     HCT  31.1*  30.6*         PLT  91*  94*             Recent Labs             05/28/21   0415  05/27/21   0509  05/26/21   0311     NA  137  137  138     K  4.1  4.7  4.1     CL  105  107  108     CO2  GLU  111*  122*  115*     BUN  24*  21*  24*     CREA  1.10*  0.95  1.04*     CA  9.3  9.1  8.8     MG   --   2.0  2.1     PHOS   --   3.6  3.4     ALB   --    --   2.8*     TBILI   --    --   1.6*          ALT   --    --   25             Lab Results         Component  Value  Date/Time            Glucose (POC)  189 (H)  05/27/2021 09:04 PM       Glucose (POC)  154 (H)  05/27/2021 03:57 PM       Glucose (POC)  176 (H)  05/27/2021 12:03 PM       Glucose (POC)  116  05/27/2021 08:23 AM            Glucose (POC)  173 (H)  05/26/2021 09:59 PM        No results for input(s): PH, PCO2, PO2, HCO3, FIO2 in the last 72 hours.   No results for input(s): INR, INREXT, INREXT in the last 72 hours.     All Micro Results                  Procedure  Component  Value  Units  Date/Time           CULTURE, BLOOD, PAIRED [[id number]]  Collected: 05/17/21 1012            Order Status: Completed  Specimen: Blood  Updated: 05/22/21 0611                Special Requests:  NO SPECIAL REQUESTS              Culture result:  NO GROWTH 5 DAYS                CULTURE, URINE [[id number]]  (Abnormal)   (Susceptibility)  Collected: 05/17/21 1458            Order Status: Completed  Specimen: Urine from Clean catch  Updated: 05/20/21 0843                Special Requests:  NO SPECIAL REQUESTS              Colony Count  --                  80000   COLONIES/mL  Culture result:                 Escherichia coli ** (EXTENDED SPECTRUM BETA LACTAMASE PRODUCER) **                                            MIXED UROGENITAL FLORA ISOLATED                     URINE CULTURE HOLD SAMPLE [[id number]]  Collected: 05/17/21 1458            Order Status: Completed  Specimen: Urine  Updated: 05/17/21 1503               Urine culture hold                 Urine on hold in Microbiology dept for 2 days.  If unpreserved urine is submitted, it cannot be used for addtional testing after 24 hours,  recollection will be required.                                   I have reviewed notes of prior 24hr.      Other pertinent lab:       Total time: -35- minutes **I personally saw and examined the patient during this time period**      I personally reviewed chart, notes, data and current medications in the medical record.  I have personally examined and treated the patient at bedside during this period.                   Care Plan discussed with: Patient, Nursing Staff, and >50% of time spent in counseling and coordination of care      Discussed:  Care Plan      Prophylaxis:  Hep SQ      Disposition:  Home w/Family             ___________________________________________________      Attending Physician: Otho Darner, MD

## 2021-05-29 ENCOUNTER — Encounter

## 2021-05-29 LAB — FIBRINOGEN
Fibrinogen: 335 mg/dL (ref 200–475)
Fibrinogen: 335 mg/dL (ref 200–475)

## 2021-05-29 LAB — GLUCOSE, POC
Glucose (POC): 153 mg/dL — ABNORMAL HIGH (ref 65–117)
Glucose (POC): 174 mg/dL — ABNORMAL HIGH (ref 65–117)
Glucose (POC): 181 mg/dL — ABNORMAL HIGH (ref 65–117)
Glucose (POC): 97 mg/dL (ref 65–117)

## 2021-05-29 LAB — CBC WITH AUTOMATED DIFF
ABS. BASOPHILS: 0 10*3/uL (ref 0.0–0.1)
ABS. EOSINOPHILS: 0.1 10*3/uL (ref 0.0–0.4)
ABS. IMM. GRANS.: 0 10*3/uL (ref 0.00–0.04)
ABS. LYMPHOCYTES: 1.2 10*3/uL (ref 0.8–3.5)
ABS. MONOCYTES: 0.3 10*3/uL (ref 0.0–1.0)
ABS. NEUTROPHILS: 2 10*3/uL (ref 1.8–8.0)
ABSOLUTE NRBC: 0 10*3/uL (ref 0.00–0.01)
BASOPHILS: 1 % (ref 0–1)
EOSINOPHILS: 3 % (ref 0–7)
HCT: 30.1 % — ABNORMAL LOW (ref 35.0–47.0)
HGB: 9 g/dL — ABNORMAL LOW (ref 11.5–16.0)
IMMATURE GRANULOCYTES: 0 % (ref 0.0–0.5)
LYMPHOCYTES: 33 % (ref 12–49)
MCH: 27.9 PG (ref 26.0–34.0)
MCHC: 29.9 g/dL — ABNORMAL LOW (ref 30.0–36.5)
MCV: 93.2 FL (ref 80.0–99.0)
MONOCYTES: 9 % (ref 5–13)
MPV: 10.7 FL (ref 8.9–12.9)
NEUTROPHILS: 54 % (ref 32–75)
NRBC: 0 PER 100 WBC
PLATELET: 94 10*3/uL — ABNORMAL LOW (ref 150–400)
RBC: 3.23 M/uL — ABNORMAL LOW (ref 3.80–5.20)
RDW: 16.6 % — ABNORMAL HIGH (ref 11.5–14.5)
WBC: 3.6 10*3/uL (ref 3.6–11.0)

## 2021-05-29 LAB — METABOLIC PANEL, BASIC
Anion gap: 0 mmol/L — ABNORMAL LOW (ref 5–15)
BUN/Creatinine ratio: 23 — ABNORMAL HIGH (ref 12–20)
BUN: 25 MG/DL — ABNORMAL HIGH (ref 6–20)
Calcium: 8.9 MG/DL (ref 8.5–10.1)
Chloride: 105 mmol/L (ref 97–108)
Creatinine: 1.07 MG/DL — ABNORMAL HIGH (ref 0.55–1.02)
Glucose: 97 mg/dL (ref 65–100)
Potassium: 3.7 mmol/L (ref 3.5–5.1)
Sodium: 139 mmol/L (ref 136–145)
eGFR: 58 mL/min/{1.73_m2} — ABNORMAL LOW (ref >60)

## 2021-05-29 LAB — CBC WITH AUTO DIFFERENTIAL
Basophils %: 1 % (ref 0–1)
Basophils Absolute: 0 10*3/uL (ref 0.0–0.1)
Eosinophils %: 3 % (ref 0–7)
Eosinophils Absolute: 0.1 10*3/uL (ref 0.0–0.4)
Granulocyte Absolute Count: 0 10*3/uL (ref 0.00–0.04)
Hematocrit: 30.1 % — ABNORMAL LOW (ref 35.0–47.0)
Hemoglobin: 9 g/dL — ABNORMAL LOW (ref 11.5–16.0)
Immature Granulocytes: 0 % (ref 0.0–0.5)
Lymphocytes %: 33 % (ref 12–49)
Lymphocytes Absolute: 1.2 10*3/uL (ref 0.8–3.5)
MCH: 27.9 PG (ref 26.0–34.0)
MCHC: 29.9 g/dL — ABNORMAL LOW (ref 30.0–36.5)
MCV: 93.2 FL (ref 80.0–99.0)
MPV: 10.7 FL (ref 8.9–12.9)
Monocytes %: 9 % (ref 5–13)
Monocytes Absolute: 0.3 10*3/uL (ref 0.0–1.0)
NRBC Absolute: 0 10*3/uL (ref 0.00–0.01)
Neutrophils %: 54 % (ref 32–75)
Neutrophils Absolute: 2 10*3/uL (ref 1.8–8.0)
Nucleated RBCs: 0 PER 100 WBC
Platelets: 94 10*3/uL — ABNORMAL LOW (ref 150–400)
RBC: 3.23 M/uL — ABNORMAL LOW (ref 3.80–5.20)
RDW: 16.6 % — ABNORMAL HIGH (ref 11.5–14.5)
WBC: 3.6 10*3/uL (ref 3.6–11.0)

## 2021-05-29 LAB — BASIC METABOLIC PANEL
Anion Gap: 0 mmol/L — ABNORMAL LOW (ref 5–15)
BUN: 25 MG/DL — ABNORMAL HIGH (ref 6–20)
Bun/Cre Ratio: 23 — ABNORMAL HIGH (ref 12–20)
CO2: 34 mmol/L — ABNORMAL HIGH (ref 21–32)
Calcium: 8.9 MG/DL (ref 8.5–10.1)
Chloride: 105 mmol/L (ref 97–108)
Creatinine: 1.07 MG/DL — ABNORMAL HIGH (ref 0.55–1.02)
Est, Glom Filt Rate: 58 mL/min/{1.73_m2} — ABNORMAL LOW (ref >60)
Glucose: 97 mg/dL (ref 65–100)
Potassium: 3.7 mmol/L (ref 3.5–5.1)
Sodium: 139 mmol/L (ref 136–145)

## 2021-05-29 LAB — POCT GLUCOSE
POC Glucose: 153 mg/dL — ABNORMAL HIGH (ref 65–117)
POC Glucose: 174 mg/dL — ABNORMAL HIGH (ref 65–117)
POC Glucose: 181 mg/dL — ABNORMAL HIGH (ref 65–117)
POC Glucose: 97 mg/dL (ref 65–117)

## 2021-05-29 MED ORDER — AMIODARONE 200 MG TAB
200 mg | ORAL_TABLET | Freq: Every day | ORAL | 0 refills | Status: AC
Start: 2021-05-29 — End: 2021-06-29

## 2021-05-29 MED ORDER — FLUDROCORTISONE 0.1 MG TAB
0.1 mg | Freq: Every day | ORAL | Status: DC
Start: 2021-05-29 — End: 2021-05-29
  Administered 2021-05-29: 13:00:00 via ORAL

## 2021-05-29 MED ORDER — GLIPIZIDE 5 MG TAB
5 mg | ORAL_TABLET | Freq: Two times a day (BID) | ORAL | 0 refills | Status: AC
Start: 2021-05-29 — End: ?

## 2021-05-29 MED ORDER — PANTOPRAZOLE 40 MG TAB, DELAYED RELEASE
40 mg | ORAL_TABLET | Freq: Every day | ORAL | 0 refills | Status: AC
Start: 2021-05-29 — End: ?

## 2021-05-29 MED ORDER — AMIODARONE 200 MG TAB
200 mg | Freq: Every day | ORAL | Status: DC
Start: 2021-05-29 — End: 2021-05-29

## 2021-05-29 MED ORDER — IPRATROPIUM-ALBUTEROL 2.5 MG-0.5 MG/3 ML NEB SOLUTION
2.5 mg-0.5 mg/3 ml | RESPIRATORY_TRACT | 1 refills | Status: AC | PRN
Start: 2021-05-29 — End: ?

## 2021-05-29 MED ORDER — FLUDROCORTISONE 0.1 MG TAB
0.1 mg | ORAL_TABLET | Freq: Every day | ORAL | 1 refills | Status: AC
Start: 2021-05-29 — End: ?

## 2021-05-29 MED ORDER — SODIUM CHLORIDE 0.9% BOLUS IV
0.9 % | Freq: Once | INTRAVENOUS | Status: AC
Start: 2021-05-29 — End: 2021-05-28
  Administered 2021-05-29: 01:00:00 via INTRAVENOUS

## 2021-05-29 MED ORDER — MIDODRINE 10 MG TAB
10 mg | ORAL_TABLET | Freq: Three times a day (TID) | ORAL | 1 refills | Status: AC
Start: 2021-05-29 — End: 2021-06-28

## 2021-05-29 MED ORDER — MEXILETINE 150 MG CAP
150 mg | ORAL_CAPSULE | Freq: Three times a day (TID) | ORAL | 0 refills | Status: AC
Start: 2021-05-29 — End: 2021-06-28

## 2021-05-29 MED FILL — ACETAMINOPHEN 325 MG TABLET: 325 mg | ORAL | Qty: 2

## 2021-05-29 MED FILL — IPRATROPIUM-ALBUTEROL 2.5 MG-0.5 MG/3 ML NEB SOLUTION: 2.5 mg-0.5 mg/3 ml | RESPIRATORY_TRACT | Qty: 3

## 2021-05-29 MED FILL — MIDODRINE 5 MG TAB: 5 mg | ORAL | Qty: 2

## 2021-05-29 MED FILL — CARAFATE 1 GRAM TABLET: 1 gram | ORAL | Qty: 1

## 2021-05-29 MED FILL — ASPIRIN 81 MG CHEWABLE TAB: 81 mg | ORAL | Qty: 1

## 2021-05-29 MED FILL — BUMETANIDE 1 MG TAB: 1 mg | ORAL | Qty: 1

## 2021-05-29 MED FILL — VENOFER 100 MG IRON/5 ML INTRAVENOUS SOLUTION: 100 mg iron/5 mL | INTRAVENOUS | Qty: 10

## 2021-05-29 MED FILL — HEPARIN (PORCINE) 5,000 UNIT/ML IJ SOLN: 5000 unit/mL | INTRAMUSCULAR | Qty: 1

## 2021-05-29 MED FILL — TRAZODONE 50 MG TAB: 50 mg | ORAL | Qty: 2

## 2021-05-29 MED FILL — PANTOPRAZOLE 40 MG TAB, DELAYED RELEASE: 40 mg | ORAL | Qty: 1

## 2021-05-29 MED FILL — SODIUM CHLORIDE 0.9 % IV: INTRAVENOUS | Qty: 500

## 2021-05-29 MED FILL — INSULIN LISPRO 100 UNIT/ML INJECTION: 100 unit/mL | SUBCUTANEOUS | Qty: 2

## 2021-05-29 MED FILL — RISAQUAD 8 BILLION CELL CAPSULE: 8 billion cell | ORAL | Qty: 1

## 2021-05-29 MED FILL — CITALOPRAM 20 MG TAB: 20 mg | ORAL | Qty: 2

## 2021-05-29 MED FILL — BUDESONIDE 0.5 MG/2 ML NEB SUSPENSION: 0.5 mg/2 mL | RESPIRATORY_TRACT | Qty: 1

## 2021-05-29 MED FILL — FLUDROCORTISONE 0.1 MG TAB: 0.1 mg | ORAL | Qty: 1

## 2021-05-29 MED FILL — MONTELUKAST 10 MG TAB: 10 mg | ORAL | Qty: 1

## 2021-05-29 MED FILL — MEXILETINE 150 MG CAP: 150 mg | ORAL | Qty: 1

## 2021-05-29 MED FILL — ATORVASTATIN 10 MG TAB: 10 mg | ORAL | Qty: 1

## 2021-05-29 MED FILL — AMIODARONE 200 MG TAB: 200 mg | ORAL | Qty: 2

## 2021-05-29 MED FILL — LEVOTHYROXINE 150 MCG TAB: 150 mcg | ORAL | Qty: 1

## 2021-05-29 NOTE — Care Coordination-Inpatient (Signed)
Met with patient prior to discharge.  Reviewed HF symptoms, daily weight parameters, low sodium diet, and early recognition of HF symptoms and notification of provider for diuretic adjustment.  Reviewed the medication changes.  Patient instructed to write down BP and weights to take to follow up appointments. Reviewed her scheduled follow up appointments with her.  Patient is an active user of [employer].  Patient states she does have transportation to her appointments.   She is out of area for [facility].  She has written HF education material.  She was given opportunity for questions.

## 2021-05-29 NOTE — Progress Notes (Signed)
Cardiology Update:     Chart reviewed. Pt may d/c from cardiac standpoint whenever otherwise medically ready. Would cont bumex, mexiletine and amio (will decrease dose for d/c) on d/c. Will arrange OP follow up with Dr. Loma Newton.     Visit Vitals  BP (!) 99/34 (BP 1 Location: Right upper arm, BP Patient Position: At rest)   Pulse 80   Temp 98.2 F (36.8 C)   Resp 15   Ht 5\' 7"  (1.702 m)   Wt 152.5 kg (336 lb 3.2 oz)   SpO2 98%   BMI 52.66 kg/m

## 2021-05-29 NOTE — Progress Notes (Signed)
Progress Notes by Cathie Olden, PT, DPT at 05/29/21 1121                Author: Cathie Olden, PT, DPT  Service: Physical Therapy  Author Type: Physical Therapist       Filed: 05/29/21 1130  Date of Service: 05/29/21 1121  Status: Signed          Editor: Cathie Olden, PT, DPT (Physical Therapist)                  Problem: Mobility Impaired (Adult and Pediatric)   Goal: *Acute Goals and Plan of Care (Insert Text)   Description: FUNCTIONAL STATUS PRIOR TO ADMISSION: Patient was modified independent using a single point cane for household and community  amb. Recent hospital admission and d/c to [facility] last 2+ weeks then readmit    HOME SUPPORT PRIOR TO ADMISSION: The patient lived with son's family but did not require assist.    Physical Therapy Goals  Revised 05/19/2021; Continue  goals 05/25/21  1.  Patient will move from supine to sit and sit to supine , scoot up and down, and roll side to side in bed with modified independence within 7 day(s).  2.  Patient will transfer from bed to chair and chair to bed with modified  independence using the least restrictive device within 7 day(s).  3.  Patient will perform sit to stand with modified independence within 7 day(s).  4.  Patient will ambulate with modified independence for 50 feet with the least restrictive device  within 7 day(s).   5.  Patient will ascend/descend 5 stairs with 1 handrail(s) with modified independence within 7 day(s).    See earlier PT notes for prior goals   Outcome: Progressing Towards Goal    PHYSICAL THERAPY TREATMENT WITH HOME O2 CHALLENGE   Patient: Deborah [name] (66 y.o. female)   Date: 05/29/2021   Diagnosis: Acute on chronic heart failure with preserved ejection fraction (HFpEF) (HCC) [I50.33] Acute on chronic  heart failure with preserved ejection fraction (HFpEF) (HCC)        Precautions: Contact, Fall   Chart, physical therapy assessment, plan of care and goals were reviewed.        ASSESSMENT   Patient continues  with skilled PT services and is progressing towards goals. Patient making slow progress with each session. Overall activity tolerance remains limited by endurance and c/o knee pain  > any other factor. Completed 2 gait trails using her SPC from home x20' requiring CGA and additional time. Noted desaturation to 86% with exertion on RA and SpO2 remained >94% on 2L via NC.       Patient is scheduled for discharge today and will require home O2. Recommend HHPT follow also to improve safety and activity tolerance in the house.      Current Level of Function Impacting Discharge (mobility/balance): CGA for transfers and gait      Other factors to consider for discharge: needs 2L O2 with exertion               PLAN :   Patient continues to benefit from skilled intervention to address the above impairments.  Continue treatment per established plan of care.   to address goals.      Recommendation for discharge: (in order for the patient to meet his/her long term goals)   Physical therapy at least 2 days/week in the home       This discharge recommendation:  Has been made in collaboration with the attending provider and/or case management      IF patient discharges home will need the following DME: portable oxygen             SUBJECTIVE:     Patient stated I don't think two liters is that much.        OBJECTIVE DATA SUMMARY:     Critical Behavior:   Neurologic State: Alert   Orientation Level: Oriented X4   Cognition: Follows commands   Safety/Judgement: Awareness of environment   Functional Mobility Training:   Bed Mobility:               Scooting: Contact guard assistance           Transfers:   Sit to Stand: Stand-by assistance              Bed to Chair: Stand-by assistance                          Balance:   Sitting: Intact   Standing: Impaired   Standing - Static: Good   Standing - Dynamic : Fair   Ambulation/Gait Training:   Distance (ft): 20 Feet (ft) (x2)   Assistive Device: Cane, straight   Ambulation - Level of  Assistance: Contact guard assistance           Gait Abnormalities: Decreased step clearance           Base of Support: Widened       Speed/Cadence: Pace decreased (<100 feet/min)                               Stairs:                         Activity Tolerance:    Fair   Documentation for home O2:            ROOM AIR      AT REST     O2 SATS   93  HR   80          ROOM AIR  WITH ACTIVITY  02 SATS   86  HR   81          (2    ) LITERS OF O2  WITH ACTIVITY  O2 SATS   94  HR   82          (2    )LITERS OF 02  PATIENT LEFT COMFORTABLY   SITTING/SUPINE  02 SATS   97  HR   80         After treatment patient left in no apparent distress:    Sitting in chair and Call bell within reach        COMMUNICATION/COLLABORATION:     The patients plan of care was discussed with: Registered nurse.       Cathie Olden, PT, DPT    Time Calculation: 27 mins

## 2021-05-29 NOTE — Progress Notes (Signed)
In preparation for discharge home later today, I have completed AVS Med-updates and added educational information on new medications to the AVS. Case Management is working on discharge needs. Home O2 evaluation is pending. Tomasa Rand, CHF Navigator will see patient prior to discharge. Will continue to follow discharge process with primary nurse.

## 2021-05-29 NOTE — Progress Notes (Signed)
1345: I have reviewed discharge instructions with the patient.  The patient verbalized understanding. Patient is discharged

## 2021-05-29 NOTE — Progress Notes (Signed)
Progress Notes by Lorelle Gibbs, NP at 05/29/21 0945                Author: Lorelle Gibbs, NP  Service: Hematology  Author Type: Nurse Practitioner       Filed: 05/29/21 1050  Date of Service: 05/29/21 0945  Status: Attested           Editor: Lorelle Gibbs, NP (Nurse Practitioner)  Cosigner: Seward Speck, MD at 05/29/21 1250          Attestation signed by Seward Speck, MD at 05/29/21 1250          I have personally seen and evaluated the patient. I find the patient's history and physical exam are consistent with the NP's documentation, and I agree with  her assessment and plan.      S: Pt states she will be discharged home today and reports she has not been home in over 6 weeks.   O: Sitting up in bed eating breakfast. 1+ BLE edema, obese. Normal respiratory effort.      A/P:      1.  Thrombocytopenia   Chronic and likely related to splenomegaly from CHF congestion.       2. Normocytic anemia due to iron deficiency and anemia of chronic disease:   S/p Venofer 247m x 3 doses  (05/22/21; 5/1 and 5/2)   -- gammopathy panel pending    -- [facility]                                      [facility]  [address] [place] [address]  W: [phone] F: [phone]          Reason for Visit:     DLilibeth Opieis a 66y.o. female who is seen for evaluation of thrombocytopenia         Hematology/Oncology Treatment History:     [facility] ; for fe def  approx 5-6 yrs ago.         History of Present Illness:     Deborah Maloneyis a pleasant 66y.o. female who was admitted on 05/09/21 from rehab for SOB and LE swelling. Pt reported not having  received her Bumex on consistent basis. Recent admission to hospital (04/08/21 to 04/24/21) for bacteremia/possible pacemaker vegetation with source thought to be osteomyelitis of left great toe; followed with by ID and  cardiology.  Discharged to rehab  with IV abx (Ceftriaxone IV ) course to completed on 05/23/21. Cardiology has been following. 05/17/21 Rapid response called for AMS/ hypotension/ pt was transferred to ICU; pt required pressor support.  Urine cx with noted E.coli ESBL; ID following    Today. Pt sitting up in the chair at bedside. Reports feeling better. Breathing improved. O2 at 2 L/min. States was seen by Hematology in [place] approx 5-6 yrs ago for anemia; had EGD/Colonoscopy which was "normal"; received IV iron over the course of 6 weeks.  Followed for about 1 yr for labs checks and everything stayed stable. Has never taken Fe pills. Denies any changes in stools; no noted blood or black tarry stools. Has had blood in urine; seen by urology; now resolved x 6 months. Reports did have nose  bleed in  Dec 2022 requiring rocket placement. No nosebleeds since that time.    Relocated to [place] from [place]: Dec 2021    Mammogram: 2 yrs ago / [place]   Family hx of cancer: mother vulva cancer: MGM: ovarian/ breast   Widow: on disability: 3 children; all live in [place].        Interval History:     Sitting up in chair at bedside.  Glad to be going home. Feeling better.       No family at bedside.         Current Facility-Administered Medications          Medication  Dose  Route  Frequency           ?  fludrocortisone (FLORINEF) tablet 0.1 mg   0.1 mg  Oral  DAILY     ?  [START ON 05/30/2021] amiodarone (CORDARONE) tablet 200 mg   200 mg  Oral  DAILY     ?  bumetanide (BUMEX) tablet 1 mg   1 mg  Oral  BID     ?  clotrimazole (MYCELEX) 1 % cream 1 Applicator   1 Applicator  Vaginal  QHS     ?  mexiletine (MEXITIL) capsule 150 mg   150 mg  Oral  TID     ?  midodrine (PROAMATINE) tablet 10 mg   10 mg  Oral  TID WITH MEALS     ?  L.acidophilus-paracasei-S.thermophil-bifidobacter (RISAQUAD) 8 billion cell capsule   1 Capsule  Oral  DAILY     ?  albuterol-ipratropium (DUO-NEB) 2.5 MG-0.5 MG/3 ML   3 mL  Nebulization  Q6H RT     ?  pantoprazole (PROTONIX)  tablet 40 mg   40 mg  Oral  ACB     ?  heparin (porcine) injection 5,000 Units   5,000 Units  SubCUTAneous  Q8H     ?  [Held by provider] metoprolol succinate (TOPROL-XL) XL tablet 25 mg   25 mg  Oral  DAILY     ?  traZODone (DESYREL) tablet 100 mg   100 mg  Oral  QHS     ?  oxyCODONE IR (ROXICODONE) tablet 5 mg   5 mg  Oral  Q6H PRN     ?  albuterol-ipratropium (DUO-NEB) 2.5 MG-0.5 MG/3 ML   3 mL  Nebulization  Q4H PRN     ?  insulin lispro (HUMALOG) injection     SubCUTAneous  AC&HS     ?  glucose chewable tablet 16 g   4 Tablet  Oral  PRN     ?  glucagon (GLUCAGEN) injection 1 mg   1 mg  IntraMUSCular  PRN     ?  dextrose 10% infusion 0-250 mL   0-250 mL  IntraVENous  PRN     ?  [Held by provider] arformoteroL (BROVANA) neb solution 15 mcg   15 mcg  Nebulization  BID RT     ?  budesonide (PULMICORT) 500 mcg/2 ml nebulizer suspension   500 mcg  Nebulization  BID RT     ?  sodium chloride (NS) flush 5-40 mL   5-40 mL  IntraVENous  Q8H     ?  sodium chloride (NS) flush 5-40 mL   5-40 mL  IntraVENous  PRN     ?  acetaminophen (TYLENOL) tablet 650 mg   650 mg  Oral  Q6H PRN          Or           ?  acetaminophen (TYLENOL) suppository 650 mg   650 mg  Rectal  Q6H PRN     ?  polyethylene glycol (MIRALAX) packet 17 g   17 g  Oral  DAILY PRN     ?  senna (SENOKOT) tablet 8.6 mg   1 Tablet  Oral  DAILY PRN     ?  promethazine (PHENERGAN) tablet 12.5 mg   12.5 mg  Oral  Q6H PRN          Or           ?  ondansetron (ZOFRAN) injection 4 mg   4 mg  IntraVENous  Q6H PRN     ?  aspirin chewable tablet 81 mg   81 mg  Oral  DAILY     ?  atorvastatin (LIPITOR) tablet 10 mg   10 mg  Oral  DAILY     ?  citalopram (CELEXA) tablet 40 mg   40 mg  Oral  DAILY     ?  [Held by provider] glipiZIDE (GLUCOTROL) tablet 5 mg   5 mg  Oral  BID WITH MEALS     ?  levothyroxine (SYNTHROID) tablet 150 mcg   150 mcg  Oral  ACB     ?  montelukast (SINGULAIR) tablet 10 mg   10 mg  Oral  QHS           ?  sucralfate (CARAFATE) tablet 1 g   1 g  Oral  TID              Allergies        Allergen  Reactions         ?  Nitroglycerin  Unknown (comments)             hypotension         ?  Aloe Vera  Rash     ?  Hydrochlorothiazide  Other (comments)             Reports 'kidneys dry up"          ?  Tetanus And Diphther. Tox (Pf)  Swelling             Swelling of arm and it turns black               Review of Systems: A complete review of systems was obtained, reviewed.  Pertinent findings reviewed above.        Physical Exam:     Visit Vitals      BP  (!) 95/45     Pulse  80     Temp  98.2 F (36.8 C)     Resp  15     Ht  '5\' 7"'  (1.702 m)     Wt  336 lb 3.2 oz (152.5 kg)     SpO2  98%        BMI  52.66 kg/m        ECOG PS: 2-3   General: obese; no distress   Respiratory: normal respiratory effort: O2 in use 2L/min   CV: BLE peripheral edema noted   Skin: no rashes; no ecchymoses; no petechiae   Psych: alert, oriented, normal mood/affect         Results:          Lab Results         Component  Value  Date/Time            WBC  3.6  05/29/2021 05:47  AM       HGB  9.0 (L)  05/29/2021 05:47 AM       HCT  30.1 (L)  05/29/2021 05:47 AM       PLATELET  94 (L)  05/29/2021 05:47 AM       MCV  93.2  05/29/2021 05:47 AM            ABS. NEUTROPHILS  2.0  05/29/2021 05:47 AM          Lab Results         Component  Value  Date/Time            Sodium  139  05/29/2021 05:47 AM       Potassium  3.7  05/29/2021 05:47 AM       Chloride  105  05/29/2021 05:47 AM       CO2  34 (H)  05/29/2021 05:47 AM       Glucose  97  05/29/2021 05:47 AM       BUN  25 (H)  05/29/2021 05:47 AM       Creatinine  1.07 (H)  05/29/2021 05:47 AM       GFR est AA  56 (L)  08/26/2020 04:25 PM       GFR est non-AA  46 (L)  08/26/2020 04:25 PM       Calcium  8.9  05/29/2021 05:47 AM            Glucose (POC)  97  05/29/2021 08:19 AM          Lab Results         Component  Value  Date/Time            Bilirubin, total  1.6 (H)  05/26/2021 03:11 AM       ALT (SGPT)  25  05/26/2021 03:11 AM       Alk. phosphatase  37 (L)   05/26/2021 03:11 AM       Protein, total  6.1 (L)  05/26/2021 03:11 AM       Albumin  2.8 (L)  05/26/2021 03:11 AM            Globulin  3.3  05/26/2021 03:11 AM          Lab Results         Component  Value  Date/Time            Iron % saturation  10 (L)  05/21/2021 03:33 AM       TIBC  345  05/21/2021 03:33 AM       Ferritin  38  05/21/2021 03:33 AM       Vitamin B12  403  05/21/2021 03:33 AM       Folate  9.6  05/21/2021 03:33 AM       C-Reactive protein  0.35  05/21/2021 03:33 AM       TSH  0.76  05/10/2021 02:37 AM            Lipase  329  04/29/2020 07:29 PM             Lab Results         Component  Value  Date/Time            INR  1.1  01/24/2021 01:46 PM       D-dimer  2.13 (H)  04/09/2021 09:29 AM            Fibrinogen  335  05/29/2021 05:47 AM           Lab Results         Component  Value  Date/Time            AFP, Serum, Tumor Marker  2.3  11/28/2020 07:34 AM           PLATELET (K/uL)        Date  Value        05/29/2021  94 (L)     05/28/2021  91 (L)     05/27/2021  94 (L)     05/24/2021  110 (L)     05/23/2021  90 (L)     05/22/2021  114 (L)     05/21/2021  72 (L)     05/20/2021  85 (L)     05/19/2021  97 (L)     05/18/2021  62 (L)     05/18/2021  68 (L)     05/17/2021  62 (L)     05/17/2021  69 (L)     05/16/2021  75 (L)     05/14/2021  75 (L)     05/13/2021  68 (L)     05/12/2021  85 (L)     05/11/2021  95 (L)     05/10/2021  77 (L)     05/09/2021  83 (L)     04/24/2021  134 (L)     04/23/2021  154     04/22/2021  146 (L)     04/21/2021  150     04/20/2021  135 (L)     04/19/2021  134 (L)     04/18/2021  118 (L)     04/17/2021  133 (L)     04/16/2021  131 (L)     04/15/2021  84 (L)     04/14/2021  77 (L)     04/13/2021  56 (L)     04/12/2021  56 (L)     04/11/2021  52 (L)     04/10/2021  64 (L)     04/09/2021  53 (L)     04/08/2021  57 (L)     01/25/2021  78 (L)     01/24/2021  81 (L)     12/03/2020  91 (L)     12/02/2020  90 (L)     11/30/2020  84 (L)     11/29/2020  67 (L)     11/28/2020  57 (L)      11/27/2020  75 (L)     11/05/2020  71 (L)     08/26/2020  77 (L)        04/29/2020  98 (L)         04/12/21 Peripheral smear   Peripheral blood, smear:        RBC's:          Normocytic normochromic RBC's.        WBC's:     Unremarkable WBC's.        Platelets:     Moderate  thrombocytopenia; no platelet clumps seen.       Test results above have been reviewed.           Assessment/Recommendations:      65 yo female with PMhx of HTN, DM, CHF, PVCs s/p pacer, bacteremia admitted with hypoxia, hypotension.       1.  Thrombocytopenia   Chronic. Peripheral smear unrevealing. No folate / B12 def noted   Likely multifactorial due  to splenomegaly from CHF and 2 recent infections (GBS bacteremia - completed Ceftriaxone course 4/26 and more recent ESBL E. coli UTI - completed Meropenem on 4/29) ; fibrinogen normal    --  gammopathy panel pending   -- follow up established in the clinic; placed in discharge summary and reviewed with pt      2. Normocytic anemia due to iron deficiency and anemia of chronic disease:   Ferritin 38 with % Fe 10. Received Venofer 236m x 3 doses  (05/22/21; 5/1 and 5/2)   -- gammopathy panel pending    -- hemoccult : pending    -- Cont to monitor and transfuse for Hgb < 7       3. E. Coli UTI:    Completed course of Meropenem on 4/29. ID following.      4. CHF: cardiology following and managing diuretics       5. Acute on chronic resp failure : 2/2 CHF; pulmonary following       6. Debility: PT/OT following       Plan reviewed with Dr TBary Castilla          Signed By:  ALorelle Gibbs NP

## 2021-05-29 NOTE — Discharge Summary (Signed)
Discharge Summary by Otho Darner, MD at 05/29/21 1610                Author: Otho Darner, MD  Service: Internal Medicine  Author Type: Physician       Filed: 05/29/21 1243  Date of Service: 05/29/21 0921  Status: Addendum          Editor: Otho Darner, MD (Physician)          Related Notes: Original Note by Otho Darner, MD (Physician) filed at 05/29/21 [phone]                 Hospitalist Discharge Summary        Patient ID:     Deborah [name]   [id number]   66 y.o.   [birth year]      Admit date of service: 05/09/2021      Discharge date of service: 05/29/2021      Admission Diagnoses: Acute on chronic heart failure with preserved ejection fraction (HFpEF) (HCC) [I50.33]      Chronic Diagnoses:        Problem List as of 05/29/2021  Date Reviewed: 03/09/2021                           Codes  Class  Noted - Resolved             * (Principal) Acute on chronic heart failure with preserved ejection fraction (HFpEF) (HCC)  ICD-10-CM: I50.33   ICD-9-CM: 428.23    05/09/2021 - Present                       Chronic respiratory failure with hypoxia [facility])  ICD-10-CM: J96.11   ICD-9-CM: 518.83, 799.02    05/09/2021 - Present                       BMI 50.0-59.9, adult (HCC)  ICD-10-CM: Y78.29   ICD-9-CM: V85.43    05/09/2021 - Present                       Hypokalemia due to loss of potassium  ICD-10-CM: E87.6   ICD-9-CM: 276.8    05/09/2021 - Present                       Acute on chronic respiratory failure with hypoxia and hypercapnia (HCC)  ICD-10-CM: J96.21, J96.22   ICD-9-CM: 518.84, 786.09, 799.02    05/09/2021 - Present                       OSA (obstructive sleep apnea)  ICD-10-CM: G47.33   ICD-9-CM: 327.23    05/09/2021 - Present                       COPD (chronic obstructive pulmonary disease) (HCC)  ICD-10-CM: J44.9   ICD-9-CM: 496    05/09/2021 - Present                       History of PSVT (paroxysmal supraventricular tachycardia)  ICD-10-CM: Z86.79   ICD-9-CM: V12.59    05/09/2021 - Present                        Anemia  ICD-10-CM: D64.9   ICD-9-CM: 285.9    05/09/2021 - Present  Hypomagnesemia  ICD-10-CM: E83.42   ICD-9-CM: 275.2    05/09/2021 - Present                       Systemic inflammatory response syndrome (SIRS) (HCC)  ICD-10-CM: R65.10   ICD-9-CM: 995.90    04/08/2021 - Present                       PVC (premature ventricular contraction)  ICD-10-CM: I49.3   ICD-9-CM: 427.69    03/06/2021 - Present                       Acute exacerbation of CHF (congestive heart failure) (HCC)  ICD-10-CM: I50.9   ICD-9-CM: 428.0    01/24/2021 - Present                       Acute on chronic diastolic (congestive) heart failure (HCC)  ICD-10-CM: I50.33   ICD-9-CM: 428.33, 428.0    01/24/2021 - Present                       CHF (congestive heart failure) (HCC)  ICD-10-CM: I50.9   ICD-9-CM: 428.0    11/27/2020 - Present                       Elevated bilirubin  ICD-10-CM: R17   ICD-9-CM: 277.4    11/27/2020 - Present                       Liver cirrhosis (HCC)  ICD-10-CM: K74.60   ICD-9-CM: 571.5    11/27/2020 - Present                       Colitis  ICD-10-CM: K52.9   ICD-9-CM: 558.9    11/27/2020 - Present                       Thrombocytopenia (HCC)  ICD-10-CM: D69.6   ICD-9-CM: 287.5    04/30/2020 - Present                       Morbid obesity (HCC)  ICD-10-CM: E66.01   ICD-9-CM: 278.01    04/30/2020 - Present                       CKD (chronic kidney disease), stage III (HCC)  ICD-10-CM: N18.30   ICD-9-CM: 585.3    04/30/2020 - Present                       Aortic valve replaced  ICD-10-CM: Z95.2   ICD-9-CM: V43.3    Unknown - Present                       Pacemaker  ICD-10-CM: Z95.0   ICD-9-CM: V45.01    Unknown - Present                       Hypothyroidism  ICD-10-CM: E03.9   ICD-9-CM: 244.9    Unknown - Present                       Asthma  ICD-10-CM: J45.909   ICD-9-CM: 493.90    Unknown - Present  Anxiety and depression  ICD-10-CM: F41.9, F32.A   ICD-9-CM: 300.00, 311    Unknown - Present                        DM (diabetes mellitus), type 2 with complications (HCC)  ICD-10-CM: E11.8   ICD-9-CM: 250.90    Unknown - Present                       Chronic narcotic use  ICD-10-CM: F11.90   ICD-9-CM: 305.50    Unknown - Present                       Chronic pain  ICD-10-CM: G89.29   ICD-9-CM: 338.29    Unknown - Present                       Rhinitis  ICD-10-CM: J31.0   ICD-9-CM: 472.0    Unknown - Present                       Hyperlipidemia  ICD-10-CM: E78.5   ICD-9-CM: 272.4    Unknown - Present                       Neuropathy  ICD-10-CM: G62.9   ICD-9-CM: 355.9    Unknown - Present                       GERD (gastroesophageal reflux disease)  ICD-10-CM: K21.9   ICD-9-CM: 530.81    Unknown - Present                       Arm paresthesia, left  ICD-10-CM: R20.2   ICD-9-CM: 782.0    04/29/2020 - Present                       RESOLVED: Bacteremia due to group B Streptococcus  ICD-10-CM: R78.81, B95.1   ICD-9-CM: 790.7, 041.02    05/09/2021 - 05/29/2021                     Discharge Medications:      Current Discharge Medication List                 START taking these medications          Details        mexiletine (MEXITIL) 150 mg capsule  Take 1 Capsule by mouth three (3) times daily for 30 days. Indications: ventricular arrhythmias, a type of abnormal heart rhythm   Qty: 90 Capsule, Refills: 0               amiodarone (CORDARONE) 200 mg tablet  Take 1 Tablet by mouth daily for 30 days.   Qty: 30 Tablet, Refills: 0               albuterol-ipratropium (DUO-NEB) 2.5 mg-0.5 mg/3 ml nebu  3 mL by Nebulization route every four (4) hours as needed for Shortness of Breath or Wheezing.   Qty: 60 Each, Refills: 1               fludrocortisone (FLORINEF) 0.1 mg tablet  Take 1 Tablet by mouth daily.   Qty: 30 Tablet, Refills: 1               midodrine (PROAMATINE) 10  mg tablet  Take 1 Tablet by mouth three (3) times daily (with meals) for 30 days.   Qty: 90 Tablet, Refills: 1               pantoprazole (PROTONIX) 40 mg  tablet  Take 1 Tablet by mouth Daily (before breakfast).   Qty: 30 Tablet, Refills: 0                        CONTINUE these medications which have NOT CHANGED          Details        bumetanide (BUMEX) 1 mg tablet  Take 1 Tablet by mouth daily.   Qty: 30 Tablet, Refills: 0               lidocaine 4 % patch  Apply to low back   Apply patch to the affected area for 12 hours a day and remove for 12 hours a day.   Qty: 4 Patch, Refills: 0               arformoteroL (BROVANA) 15 mcg/2 mL nebu neb solution  2 mL by Nebulization route two (2) times a day.   Qty: 120 mL, Refills: 0               miconazole (MICOTIN) 2 % topical powder  Apply  to affected area two (2) times a day. APPLY TO bilateral breasts      Nursing, document site in comments   Qty: 1 g, Refills: 0               atorvastatin (LIPITOR) 10 mg tablet  Take 1 Tablet by mouth daily.               levothyroxine (SYNTHROID) 150 mcg tablet  Take 1 Tablet by mouth Daily (before breakfast).   Qty: 30 Tablet, Refills: 1               aspirin 81 mg chewable tablet  Take 1 Tablet by mouth daily.   Qty: 30 Tablet, Refills: 0               acetaminophen (TYLENOL) 500 mg tablet  Take 2 Tablets by mouth every six (6) hours as needed for Pain.               albuterol (PROVENTIL HFA, VENTOLIN HFA, PROAIR HFA) 90 mcg/actuation inhaler  Take 2 Puffs by inhalation every six (6) hours as needed.               citalopram (CELEXA) 40 mg tablet  Take 1 Tablet by mouth daily.               fluticasone propionate (FLONASE) 50 mcg/actuation nasal spray  2 Sprays by Nasal route daily as needed.               sucralfate (CARAFATE) 1 gram tablet  Take 1 Tablet by mouth three (3) times daily.               traZODone (DESYREL) 100 mg tablet  Take 1 Tablet by mouth nightly.               montelukast (SINGULAIR) 10 mg tablet  Take 1 Tablet by mouth nightly.               naloxone (Narcan) 4 mg/actuation nasal spray  Use 1 spray intranasally, then discard. Repeat with new spray every  2 min  as needed for opioid overdose symptoms, alternating nostrils.   Qty: 2 Each, Refills: 0                        STOP taking these medications                  metoprolol succinate (TOPROL-XL) 25 mg XL tablet  Comments:    Reason for Stopping:                      glimepiride (AMARYL) 2 mg tablet  Comments:    Reason for Stopping:                      cefTRIAXone 2 gram 2 g IV syringe  Comments:    Reason for Stopping:                      guaiFENesin ER (MUCINEX) 600 mg ER tablet  Comments:    Reason for Stopping:                      budesonide (PULMICORT) 0.5 mg/2 mL nbsp  Comments:    Reason for Stopping:                             Follow up Care:     1. Barrett, Lance Sell, NP in 1-2 weeks   2. Pulmonary   3 cardiology       Diet:  Cardiac Diet      Disposition:   Home.      Advanced Directive:      Discharge Exam:   See today's note.      CONSULTATIONS: Cardiology, Pulmonary/Intensive care, and Hematology/Oncology      Significant Diagnostic Studies:      Recent Labs            05/29/21   0547  05/28/21   0415     WBC  3.6  3.8     HGB  9.0*  9.2*     HCT  30.1*  31.1*         PLT  94*  91*          Recent Labs             05/29/21   0547  05/28/21   0415  05/27/21   0509     NA  139  137  137     K  3.7  4.1  4.7     CL  105  105  107     CO2  34*  29  30     BUN  25*  24*  21*     CREA  1.07*  1.10*  0.95     GLU  97  111*  122*     CA  8.9  9.3  9.1     MG   --    --   2.0          PHOS   --    --   3.6        No results for input(s): ALT, AP, TBIL, TBILI, TP, ALB, GLOB, GGT, AML, LPSE in the last 72 hours.      No lab exists for component: SGOT, GPT, AMYP, HLPSE   No results for input(s): INR,  PTP, APTT, INREXT in the last 72 hours.    No results for input(s): FE, TIBC, PSAT, FERR in the last 72 hours.    No results for input(s): PH, PCO2, PO2 in the last 72 hours.   No results for input(s): CPK, CKMB in the last 72 hours.      No lab exists for component: TROPONINI     Lab Results         Component  Value   Date/Time            Glucose (POC)  97  05/29/2021 08:19 AM       Glucose (POC)  174 (H)  05/28/2021 08:58 PM       Glucose (POC)  181 (H)  05/28/2021 08:21 PM       Glucose (POC)  207 (H)  05/28/2021 04:20 PM            Glucose (POC)  129 (H)  05/28/2021 11:39 AM                    HOSPITAL COURSE & TREATMENT RENDERED:    1.  ESBL E. Coli UTI/recent group B strep bacteremia: completed IV CTX on 4/26. Finished IV meropenem  04/29         2.  Hypotension: refractory, but seems improving.  Unclear etiology.  Unlikely sepsis. On hydrocortisone and midodrine. Echo with EF 60-65%.  off pressors gtt       3.   Acute resp failure with hypoxia/hypercapnea: due to CHF, OSA, obesity hypoventilation. cont supplement O2, diamox prn, bipap.  Pulm fFU. Pt's SAO2 dropped to 86% on ambulation with PT, which  was corrected with 2L of O2. Ordered home O2.        4.  Acute on chronic dCHF: EF 60-65. Back on bumex, which was held due to hypotension. Tolerating well.  Cardiology  FU       5.   PVCs/bioprosthetic AVR: s/p pacer.  Cont ASA, statin. Evaluated by EP cardiology and plan for PVC [facility] at [facility] at some point        6.  Anemia/thrombocytopenia: this is chronic. S/p IV iron.  Monitor CBC. Evaluated by hematology and workup in progress. FU as outpatient        7.  DM type 2: A1C 5.7%.  Cont SSI       8.  Weakness: cont PT/OT.  Will need HH        9.  Morbid obesity/OSA. Would benefit from weight loss. Needs sleep study for new CPAP. FU with pulmonary.             Discharged in stable condition.      Spent 35 minutes      Signed:   Otho Darner, MD   05/29/2021   9:21 AM

## 2021-05-29 NOTE — Progress Notes (Signed)
Progress  Notes by Lezlie Lye at 05/29/21 0920                Author: Lezlie Lye  Service: --  Author Type: Care Management       Filed: 05/29/21 0921  Date of Service: 05/29/21 0920  Status: Signed          Editor: Lezlie Lye (Care Management)               Medicare pt has received 2nd IM letter informing them of their right to appeal the discharge.  A copy has been placed on pt  chart.   Lezlie Lye CMS

## 2021-05-29 NOTE — Progress Notes (Signed)
Progress  Notes by Otho Darner, MD at 05/29/21 0747                Author: Otho Darner, MD  Service: Internal Medicine  Author Type: Physician       Filed: 05/29/21 0752  Date of Service: 05/29/21 0747  Status: Signed          Editor: Otho Darner, MD (Physician)               [facility]   [address] [place], [place], [place] [address]   [phone]         Hospitalist Progress Note         NAME: Deborah [name]    DOB:  Jun 03, 1955   MRM:  [id number]      Date of service: 05/29/2021  8:51 AM               Assessment and Plan:     1.  ESBL E. Coli UTI/recent group B strep bacteremia: completed IV CTX on 4/26. Finished IV meropenem 04/29         2.  Hypotension: refractory, but seems improving.  Unclear etiology.  Unlikely sepsis.  On hydrocortisone and midodrine. Echo with EF 60-65%. off pressors gtt       3.   Acute resp failure with hypoxia/hypercapnea: due to CHF, OSA, obesity hypoventilation. cont supplement O2, diamox prn, bipap.  Pulm  following. Check home O2 need       4.  Acute on chronic dCHF: EF 60-65. Back on bumex, which was held due to hypotension. Tolerating well.  Card iology fFU       5.   PVCs/bioprosthetic AVR: s/p pacer.  Cont ASA, statin. Evaluated by EP cardiology  and plan for PVC ablation at [facility] at some point        6.  Anemia/thrombocytopenia: this is chronic. S/p IV iron.  Monitor CBC. Evaluated by hematology and workup in progress. FU as outpatient        7.  DM type 2: A1C 5.7%.  Cont SSI       8.  Weakness: cont PT/OT.  Will need HH       9.  Morbid obesity/OSA. Would benefit from weight loss. Cont CPAP                  Subjective:        Chief Complaint:: Patient was seen and examined as a follow up for UTI.  Chart was reviewed.  feels better       ROS:   (bold if positive,  if negative)      Tolerating PT  Tolerating Diet                Objective:        Last 24hrs VS reviewed since prior progress note. Most recent are:      Visit Vitals       BP  (!) 107/53     Pulse  80     Temp  98.1 F (36.7 C)     Resp  15     Ht   (1.702 m)     Wt  152.5 kg (336 lb 3.2 oz)     SpO2  98%        BMI  52.66 kg/m        SpO2 Readings from Last 6 Encounters:      05/29/21  98%  04/24/21  97%      03/09/21  96%      01/26/21  90%      12/03/20  96%      11/05/20  92%           O2 Flow Rate (L/min): 2 l/min        Intake/Output Summary (Last 24 hours) at 05/29/2021 0747   Last data filed at 05/28/2021 1333     Gross per 24 hour        Intake  --        Output  1000 ml        Net  -1000 ml               Physical Exam:      Gen:  obese, in no acute distress   HEENT:  Pink conjunctivae, PERRL, hearing intact to voice, moist mucous membranes   Neck:  Supple, without masses, thyroid non-tender   Resp:  No accessory muscle use, clear breath sounds without wheezes rales or rhonchi   Card:  No murmurs, normal S1, S2 without thrills, bruits. +++ peripheral edema   Abd:  Soft, non-tender, non-distended, normoactive bowel sounds are present, no palpable organomegaly and no detectable hernias   Lymph:  No cervical or inguinal adenopathy   Musc:  No cyanosis or clubbing   Skin:  No rashes or ulcers, skin turgor is good   Neuro:  Cranial nerves are grossly intact, no focal motor weakness, follows commands appropriately   Psych:  Good insight, oriented to person, place and time, alert   __________________________________________________________________   Medications Reviewed: (see below)     Medications:          Current Facility-Administered Medications          Medication  Dose  Route  Frequency           ?  fludrocortisone (FLORINEF) tablet 0.1 mg   0.1 mg  Oral  DAILY     ?  bumetanide (BUMEX) tablet 1 mg   1 mg  Oral  BID     ?  iron sucrose (VENOFER) injection 200 mg   200 mg  IntraVENous  DAILY     ?  clotrimazole (MYCELEX) 1 % cream 1 Applicator   1 Applicator  Vaginal  QHS     ?  mexiletine (MEXITIL) capsule 150 mg   150 mg  Oral  TID     ?  midodrine (PROAMATINE) tablet  10 mg   10 mg  Oral  TID WITH MEALS     ?  amiodarone (CORDARONE) tablet 400 mg   400 mg  Oral  BID     ?  L.acidophilus-paracasei-S.thermophil-bifidobacter (RISAQUAD) 8 billion cell capsule   1 Capsule  Oral  DAILY     ?  albuterol-ipratropium (DUO-NEB) 2.5 MG-0.5 MG/3 ML   3 mL  Nebulization  Q6H RT     ?  pantoprazole (PROTONIX) tablet 40 mg   40 mg  Oral  ACB     ?  heparin (porcine) injection 5,000 Units   5,000 Units  SubCUTAneous  Q8H     ?  [Held by provider] metoprolol succinate (TOPROL-XL) XL tablet 25 mg   25 mg  Oral  DAILY     ?  traZODone (DESYREL) tablet 100 mg   100 mg  Oral  QHS     ?  oxyCODONE IR (ROXICODONE) tablet 5 mg   5 mg  Oral  Q6H PRN     ?  albuterol-ipratropium (DUO-NEB) 2.5 MG-0.5 MG/3 ML   3 mL  Nebulization  Q4H PRN     ?  insulin lispro (HUMALOG) injection     SubCUTAneous  AC&HS     ?  glucose chewable tablet 16 g   4 Tablet  Oral  PRN     ?  glucagon (GLUCAGEN) injection 1 mg   1 mg  IntraMUSCular  PRN           ?  dextrose 10% infusion 0-250 mL   0-250 mL  IntraVENous  PRN           ?  [Held by provider] arformoteroL (BROVANA) neb solution 15 mcg   15 mcg  Nebulization  BID RT     ?  budesonide (PULMICORT) 500 mcg/2 ml nebulizer suspension   500 mcg  Nebulization  BID RT     ?  sodium chloride (NS) flush 5-40 mL   5-40 mL  IntraVENous  Q8H     ?  sodium chloride (NS) flush 5-40 mL   5-40 mL  IntraVENous  PRN     ?  acetaminophen (TYLENOL) tablet 650 mg   650 mg  Oral  Q6H PRN          Or           ?  acetaminophen (TYLENOL) suppository 650 mg   650 mg  Rectal  Q6H PRN     ?  polyethylene glycol (MIRALAX) packet 17 g   17 g  Oral  DAILY PRN     ?  senna (SENOKOT) tablet 8.6 mg   1 Tablet  Oral  DAILY PRN     ?  promethazine (PHENERGAN) tablet 12.5 mg   12.5 mg  Oral  Q6H PRN          Or           ?  ondansetron (ZOFRAN) injection 4 mg   4 mg  IntraVENous  Q6H PRN     ?  aspirin chewable tablet 81 mg   81 mg  Oral  DAILY     ?  atorvastatin (LIPITOR) tablet 10 mg   10 mg  Oral   DAILY     ?  citalopram (CELEXA) tablet 40 mg   40 mg  Oral  DAILY     ?  [Held by provider] glipiZIDE (GLUCOTROL) tablet 5 mg   5 mg  Oral  BID WITH MEALS     ?  levothyroxine (SYNTHROID) tablet 150 mcg   150 mcg  Oral  ACB     ?  montelukast (SINGULAIR) tablet 10 mg   10 mg  Oral  QHS           ?  sucralfate (CARAFATE) tablet 1 g   1 g  Oral  TID            Lab Data Reviewed: (see below)     Lab Review:          Recent Labs             05/29/21   0547  05/28/21   0415  05/27/21   0509     WBC  3.6  3.8  3.7     HGB  9.0*  9.2*  8.9*     HCT  30.1*  31.1*  30.6*          PLT  94*  91*  94*             Recent Labs             05/29/21   0547  05/28/21   0415  05/27/21   0509     NA  139  137  137     K  3.7  4.1  4.7     CL  105  105  107     CO2  34*  29  30     GLU  97  111*  122*     BUN  25*  24*  21*     CREA  1.07*  1.10*  0.95     CA  8.9  9.3  9.1     MG   --    --   2.0          PHOS   --    --   3.6             Lab Results         Component  Value  Date/Time            Glucose (POC)  174 (H)  05/28/2021 08:58 PM       Glucose (POC)  181 (H)  05/28/2021 08:21 PM       Glucose (POC)  207 (H)  05/28/2021 04:20 PM       Glucose (POC)  129 (H)  05/28/2021 11:39 AM            Glucose (POC)  120 (H)  05/28/2021 08:34 AM        No results for input(s): PH, PCO2, PO2, HCO3, FIO2 in the last 72 hours.   No results for input(s): INR, INREXT, INREXT in the last 72 hours.     All Micro Results                  Procedure  Component  Value  Units  Date/Time           CULTURE, BLOOD, PAIRED [[id number]]  Collected: 05/17/21 1012            Order Status: Completed  Specimen: Blood  Updated: 05/22/21 0611                Special Requests:  NO SPECIAL REQUESTS              Culture result:  NO GROWTH 5 DAYS                CULTURE, URINE [[id number]]  (Abnormal)  (Susceptibility)  Collected: 05/17/21 1458            Order Status: Completed  Specimen: Urine from Clean catch  Updated: 05/20/21 0843                Special Requests:   NO SPECIAL REQUESTS              Colony Count  --                  80000   COLONIES/mL                  Culture result:                 Escherichia coli ** (EXTENDED SPECTRUM BETA LACTAMASE PRODUCER) **  MIXED UROGENITAL FLORA ISOLATED                     URINE CULTURE HOLD SAMPLE [[id number]]  Collected: 05/17/21 1458            Order Status: Completed  Specimen: Urine  Updated: 05/17/21 1503               Urine culture hold                 Urine on hold in Microbiology dept for 2 days.  If unpreserved urine is submitted, it cannot be used for addtional testing after 24 hours,  recollection will be required.                                   I have reviewed notes of prior 24hr.      Other pertinent lab:       Total time: -35- minutes **I personally saw and examined the patient during this time period**      I personally reviewed chart, notes, data and current medications in the medical record.  I have personally examined and treated the patient at bedside during this period.                   Care Plan discussed with: Patient, Nursing Staff, and >50% of time spent in counseling and coordination of care      Discussed:  Care Plan      Prophylaxis:  Hep SQ      Disposition:  Home w/Family             ___________________________________________________      Attending Physician: Otho DarnerMesfin Goldie Dimmer, MD

## 2021-05-29 NOTE — Progress Notes (Signed)
Progress Notes by Fareeha Evon, Hali Marry, NP at 05/29/21 [phone]                Author: Lavonne Chick, NP  Service: Pulmonary Disease  Author Type: Nurse Practitioner       Filed: 05/29/21 0941  Date of Service: 05/29/21 0826  Status: Signed          Editor: Reni Hausner, Hali Marry, NP (Nurse Practitioner)               [facility]            Name:  Deborah [name]  MRN:  [id number]          DOB:  01-Nov-1955  Hospital:  [facility]          Date:  05/29/2021                Impression  Plan     1.     Acute respiratory failure   2.  Hypoxia   3.  Hypercapnea   4.  Pulmonary infiltrates   5.  ESBL UTI   6.  OSA   7.  Morbid obesity                         Wean O2 to keep sats above 90%. RT eval home oxygen needs     Continue BiPAP nightly and when napping     Diuresis as BP allows.      Continue midodrine     Duonebs     Continue meropenem for ESBL UTI     OOB into chair     PT/OT as BP allows     Needs outpatient sleep eval appt with CPAP interrogation       Ms.Breisch is stable from a pulmonary standpoint. We will sign off and arrange for outpatient sleep follow-up asap.  Please call with questions.                    Radiology   (personally reviewed)  CXR4/20/23: basilar infiltrates-overall improving             Subjective        Cc: shortness of breath      66 yo with PMHx diastolic CHF, recent strep bacteremia and OSA presenting with increasing SOB. Found to have increased leg swelling and increased infiltrates after missing several doses of diuretics at rehab center. Clinical scenario complicated further  by ESBL UTI and septic shock. Pt has hx of mild asthma, denies COPD. Smoked 1 ppd for 25 years, quit over 10 years ago.       Interval history   Afebrile   BP soft/stable--off Neo   Sats 98% on 2L NC; wore Bipap overnight   Hgb 9--stable   Plts 94   Bicarb 29 - trending down   Creat 1.07--stable/better   4/29 proBNP 2300- down from 2978    Blood cx no growth x 5 days -  final   1000 documented UOP      4/25 CXR: Pulmonary edema unchanged      4/25 ECHO: EF 60-65%; technically difficult with poor endocardial visualization      Review of Systems:   Feels well.  Denies SOB this morning. No fever/chills. Denies cough.         A comprehensive review of systems was negative except for that written in the HPI.  Past Medical History:        Diagnosis  Date         ?  (HFpEF) heart failure with preserved ejection fraction (HCC)       ?  Anxiety and depression       ?  Aortic valve replaced            S/p bovine aortic valve replacement.         ?  Asthma       ?  Chronic narcotic use       ?  Chronic obstructive pulmonary disease (HCC)       ?  Chronic pain       ?  CKD (chronic kidney disease), stage III (HCC)            Baseline creatinine is 1.3-1.4 with GFR in the 40s.         ?  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  GERD (gastroesophageal reflux disease)       ?  History of vascular access device  04/13/2021          4 FR Single PICC for LTABX: R cephalic vessell length 48 CM Max P leave @ 1 CM out; Arm circumferenc 40 CM         ?  Hyperlipidemia       ?  Hypothyroidism       ?  Morbid obesity (HCC)       ?  Neuropathy       ?  Obstructive sleep apnea           ?  Rhinitis             Past Surgical History:         Procedure  Laterality  Date          ?  COLONOSCOPY  N/A  12/01/2020          COLONOSCOPY performed by Ian Malkin, MD at [facility] ENDOSCOPY          ?  HX AORTIC VALVE REPLACEMENT              Bovine bioprosthetic          ?  HX PACEMAKER               Prior to Admission medications             Medication  Sig  Start Date  End Date  Taking?  Authorizing Provider            bumetanide (BUMEX) 1 mg tablet  Take 1 Tablet by mouth daily.  04/25/21    Yes  Horald Chestnut, DO     lidocaine 4 % patch  Apply to low back   Apply patch to the affected area for 12 hours a day and remove for 12 hours a day.  04/24/21    Yes  Horald Chestnut, DO     arformoteroL (BROVANA) 15 mcg/2 mL  nebu neb solution  2 mL by Nebulization route two (2) times a day.  04/24/21    Yes  Horald Chestnut, DO            miconazole (MICOTIN) 2 % topical powder  Apply  to affected area two (2) times a day. APPLY TO bilateral breasts      Nursing, document site in comments  04/24/21    Yes  Horald Chestnut, DO  atorvastatin (LIPITOR) 10 mg tablet  Take 1 Tablet by mouth daily.      Yes  Provider, Historical     metoprolol succinate (TOPROL-XL) 25 mg XL tablet  Take 1 Tablet by mouth daily.  02/21/21    Yes  Thurston Pounds, MD     levothyroxine (SYNTHROID) 150 mcg tablet  Take 1 Tablet by mouth Daily (before breakfast).  01/27/21    Yes  Tefera, Mesfin, MD     aspirin 81 mg chewable tablet  Take 1 Tablet by mouth daily.  12/03/20    Yes  Carrolyn Meiers, MD     acetaminophen (TYLENOL) 500 mg tablet  Take 2 Tablets by mouth every six (6) hours as needed for Pain.      Yes  Provider, Historical     albuterol (PROVENTIL HFA, VENTOLIN HFA, PROAIR HFA) 90 mcg/actuation inhaler  Take 2 Puffs by inhalation every six (6) hours as needed.      Yes  Provider, Historical     citalopram (CELEXA) 40 mg tablet  Take 1 Tablet by mouth daily.  02/22/20    Yes  Provider, Historical     fluticasone propionate (FLONASE) 50 mcg/actuation nasal spray  2 Sprays by Nasal route daily as needed.      Yes  Provider, Historical     sucralfate (CARAFATE) 1 gram tablet  Take 1 Tablet by mouth three (3) times daily.      Yes  Provider, Historical     traZODone (DESYREL) 100 mg tablet  Take 1 Tablet by mouth nightly.  12/31/19    Yes  Provider, Historical     glimepiride (AMARYL) 2 mg tablet  Take 1 Tablet by mouth two (2) times a day.      Yes  Provider, Historical     montelukast (SINGULAIR) 10 mg tablet  Take 1 Tablet by mouth nightly.      Yes  Provider, Historical            naloxone (Narcan) 4 mg/actuation nasal spray  Use 1 spray intranasally, then discard. Repeat with new spray every 2 min as needed for opioid overdose symptoms, alternating nostrils.   04/24/21      Horald Chestnut, DO          Current Facility-Administered Medications          Medication  Dose  Route  Frequency           ?  fludrocortisone (FLORINEF) tablet 0.1 mg   0.1 mg  Oral  DAILY     ?  bumetanide (BUMEX) tablet 1 mg   1 mg  Oral  BID     ?  iron sucrose (VENOFER) injection 200 mg   200 mg  IntraVENous  DAILY     ?  clotrimazole (MYCELEX) 1 % cream 1 Applicator   1 Applicator  Vaginal  QHS     ?  mexiletine (MEXITIL) capsule 150 mg   150 mg  Oral  TID     ?  midodrine (PROAMATINE) tablet 10 mg   10 mg  Oral  TID WITH MEALS     ?  amiodarone (CORDARONE) tablet 400 mg   400 mg  Oral  BID           ?  L.acidophilus-paracasei-S.thermophil-bifidobacter (RISAQUAD) 8 billion cell capsule   1 Capsule  Oral  DAILY           ?  albuterol-ipratropium (DUO-NEB) 2.5 MG-0.5 MG/3 ML   3  mL  Nebulization  Q6H RT     ?  pantoprazole (PROTONIX) tablet 40 mg   40 mg  Oral  ACB     ?  heparin (porcine) injection 5,000 Units   5,000 Units  SubCUTAneous  Q8H     ?  [Held by provider] metoprolol succinate (TOPROL-XL) XL tablet 25 mg   25 mg  Oral  DAILY     ?  traZODone (DESYREL) tablet 100 mg   100 mg  Oral  QHS     ?  insulin lispro (HUMALOG) injection     SubCUTAneous  AC&HS     ?  [Held by provider] arformoteroL (BROVANA) neb solution 15 mcg   15 mcg  Nebulization  BID RT     ?  budesonide (PULMICORT) 500 mcg/2 ml nebulizer suspension   500 mcg  Nebulization  BID RT     ?  sodium chloride (NS) flush 5-40 mL   5-40 mL  IntraVENous  Q8H     ?  aspirin chewable tablet 81 mg   81 mg  Oral  DAILY     ?  atorvastatin (LIPITOR) tablet 10 mg   10 mg  Oral  DAILY     ?  citalopram (CELEXA) tablet 40 mg   40 mg  Oral  DAILY     ?  [Held by provider] glipiZIDE (GLUCOTROL) tablet 5 mg   5 mg  Oral  BID WITH MEALS     ?  levothyroxine (SYNTHROID) tablet 150 mcg   150 mcg  Oral  ACB     ?  montelukast (SINGULAIR) tablet 10 mg   10 mg  Oral  QHS           ?  sucralfate (CARAFATE) tablet 1 g   1 g  Oral  TID          Allergies         Allergen  Reactions         ?  Nitroglycerin  Unknown (comments)             hypotension         ?  Aloe Vera  Rash     ?  Hydrochlorothiazide  Other (comments)             Reports 'kidneys dry up"          ?  Tetanus And Diphther. Tox (Pf)  Swelling             Swelling of arm and it turns black           Social History          Tobacco Use         ?  Smoking status:  Former              Packs/day:  1.00         Years:  40.00         Pack years:  40.00         Types:  Cigarettes         Quit date:  03/21/2010         Years since quitting:  11.1         ?  Smokeless tobacco:  Never       Substance Use Topics         ?  Alcohol use:  Not Currently           Family  History         Problem  Relation  Age of Onset          ?  Hypertension  Mother            ?  Hypertension  Father                 Laboratory: I have personally reviewed the flowsheet and labs.         Recent Labs             05/29/21   0547  05/28/21   0415  05/27/21   0509     WBC  3.6  3.8  3.7     HGB  9.0*  9.2*  8.9*     HCT  30.1*  31.1*  30.6*          PLT  94*  91*  94*             Recent Labs             05/29/21   0547  05/28/21   0415  05/27/21   0509     NA  139  137  137     K  3.7  4.1  4.7     CL  105  105  107     CO2  34*  29  30     GLU  97  111*  122*     BUN  25*  24*  21*     CREA  1.07*  1.10*  0.95     CA  8.9  9.3  9.1     MG   --    --   2.0          PHOS   --    --   3.6                Objective:     Visit Vitals      BP  (!) 99/34 (BP 1 Location: Right upper arm, BP Patient Position: At rest)     Pulse  80     Temp  98.2 F (36.8 C)     Resp  15     Ht  5\' 7"  (1.702 m)     Wt  152.5 kg (336 lb 3.2 oz)     SpO2  98%        BMI  52.66 kg/m           Intake/Output Summary (Last 24 hours) at 05/29/2021 [id number]   Last data filed at 05/28/2021 1333     Gross per 24 hour        Intake  --        Output  1000 ml        Net  -1000 ml             EXAM:     GENERAL: sleepy but wakes easily, alert, obese HEENT:  anicteric, EOMI, no alar  flaring or epistaxis, oral mucosa moist without cyanosis, NECK:  no jugular vein distention, no retractions,  no thyromegaly or masses, LUNGS: distant, CTA, no w/r/rHEART:  Regular rate and rhythm with no MGR; +2 edema is present in LE, ABDOMEN:  soft with no tenderness, EXTREMITIES:  warm with no cyanosis, SKIN:  no jaundice or ecchymosis, and NEUROLOGIC:  alert  and oriented, grossly non-focal      Lavonne ChickKasey S Juma Oxley, NP  [facility]

## 2021-05-29 NOTE — Progress Notes (Signed)
1331:  Pt accepted by [facility]. Portable O2 tank delivered to pt's room.  [place] office to deliver Home O2 and nebulizer when pt reaches home.  AVS sent in [facility] to [facility].  Pt to follow up OP with providers as scheduled.      CM Note:  Orders for home O2 and for nebulizer noted.  Pt requested [facility] as she had had them before and referrals were sent in [facility].  Awaiting O2 eval to send.  Pt's family to transport her home at d/c and will stay with her until her son gets home.  AVS sent in [facility] to [facility]).  Pt to follow up OP.  L.Neely, RN

## 2021-05-31 LAB — GAMMOPATHY EVAL, SPEP/IFE, IG QT/FLC
A/G RATIO, 149531: 1.2 (ref 0.7–1.7)
A/G ratio: 1.2 (ref 0.7–1.7)
ALBUMIN, 149520: 3.3 g/dL (ref 2.9–4.4)
ALPHA-2-GLOBULIN: 0.7 g/dL (ref 0.4–1.0)
Albumin: 3.3 g/dL (ref 2.9–4.4)
Alpha-1-Globulin: 0.3 g/dL (ref 0.0–0.4)
Alpha-1-globulin: 0.3 g/dL (ref 0.0–0.4)
Alpha-2-Globulin: 0.7 g/dL (ref 0.4–1.0)
Beta Globulin: 1 g/dL (ref 0.7–1.3)
Beta Globulin: 1 g/dL (ref 0.7–1.3)
FREE KAPPA LT CHAINS, SERUM: 41.1 mg/L — ABNORMAL HIGH (ref 3.3–19.4)
FREE LAMBDA LT CHAINS, SERUM: 21.2 mg/L (ref 5.7–26.3)
Free Kappa Lt Chains, serum: 41.1 mg/L — ABNORMAL HIGH (ref 3.3–19.4)
Free Lambda Lt Chains, serum: 21.2 mg/L (ref 5.7–26.3)
GAMMA GLOBULIN, 149524: 1.1 g/dL (ref 0.4–1.8)
GLOBULIN, TOTAL: 3 g/dL (ref 2.2–3.9)
Gamma Globulin: 1.1 g/dL (ref 0.4–1.8)
Globulin, total: 3 g/dL (ref 2.2–3.9)
Immunoglobulin A, Qt.: 290 mg/dL (ref 87–352)
Immunoglobulin A, Qt.: 290 mg/dL (ref 87–352)
Immunoglobulin G, Qt.: 1201 mg/dL (ref 586–1602)
Immunoglobulin G, Quantitative: 1201 mg/dL (ref 586–1602)
Immunoglobulin M, Qt.: 35 mg/dL (ref 26–217)
Immunoglobulin M, Quantitative: 35 mg/dL (ref 26–217)
KAPPA/LAMBDA RATIO, SERUM: 1.94 — ABNORMAL HIGH (ref 0.26–1.65)
Kappa/Lambda ratio, serum: 1.94 — ABNORMAL HIGH (ref 0.26–1.65)
Protein, total: 6.3 g/dL (ref 6.0–8.5)
Total Protein: 6.3 g/dL (ref 6.0–8.5)

## 2021-06-01 NOTE — Telephone Encounter (Signed)
Telephone Encounter by Lindalou Hose, RN at 06/01/21 1558                Author: Lindalou Hose, RN  Service: --  Author Type: Navigator       Filed: 06/01/21 1559  Encounter Date: 06/01/2021  Status: Signed          Editor: Lindalou Hose, RN (Navigator)               Telephone call made to patient for purpose of care transition.  Message left with call back number.

## 2021-06-07 ENCOUNTER — Ambulatory Visit: Admit: 2021-06-07 | Discharge: 2021-06-22 | Payer: MEDICARE | Primary: Family

## 2021-06-07 ENCOUNTER — Ambulatory Visit: Primary: Family

## 2021-06-07 DIAGNOSIS — Z950 Presence of cardiac pacemaker: Secondary | ICD-10-CM

## 2021-06-14 NOTE — Telephone Encounter (Signed)
Verified patient with two types of identifiers. Notified of NP recommendations. Will fax to Dr. Jonell Cluck. Patient verbalized understanding and will call with any other questions.      Faxed NP recommendations to Dr. Tenna Child office at fax number [phone].  Fax confirmation received.

## 2021-06-14 NOTE — Telephone Encounter (Signed)
Verified patient with two types of identifiers. Patient states she sees Dr. Lamonte Richer for pain management. They would like to start patient on Diclofenac but are requesting OK from Dr. Raelene Bott prior to prescribing. Will ask MD/NP regarding any contraindication to starting Diclofenac. Will fax response to Dr. Tenna Child office.

## 2021-06-14 NOTE — Telephone Encounter (Signed)
There's no cardiac contraindication to patient using diclofenac.  She's not taking an [place].  We do recommend that she takes it with food.

## 2021-06-14 NOTE — Telephone Encounter (Signed)
Pt called and stated another dr want to write a new prescription for her but stated she have to get the ok from [facility],please advise    Pt # [phone]

## 2021-06-16 ENCOUNTER — Emergency Department: Admit: 2021-06-17 | Payer: MEDICARE | Primary: Family

## 2021-06-16 ENCOUNTER — Inpatient Hospital Stay
Admit: 2021-06-16 | Discharge: 2021-06-17 | Disposition: A | Discharge status: Home / Self Care | Payer: MEDICARE | Attending: Emergency Medicine

## 2021-06-16 DIAGNOSIS — R600 Localized edema: Secondary | ICD-10-CM

## 2021-06-16 DIAGNOSIS — R609 Edema, unspecified: Secondary | ICD-10-CM

## 2021-06-16 NOTE — ED Triage Notes (Signed)
Patient reports increasing swelling in both legs.  Patient with CHF, O2 dependency.  States normally has swelling in both legs symmetrically, feels like her right leg is more swollen than usual. Also states usually her swelling stops at the knees, the right leg is swollen from foot to hip.

## 2021-06-16 NOTE — ED Provider Notes (Signed)
[facility] EMERGENCY DEPT  EMERGENCY DEPARTMENT ENCOUNTER      Pt Name: Deborah [name]  MRN: [id number]  Birthdate 01-Apr-1955  Date of evaluation: 06/16/2021  Provider: Hazeline Junker, MD    CHIEF COMPLAINT     No chief complaint on file.        HISTORY OF PRESENT ILLNESS   (Location/Symptom, Timing/Onset, Context/Setting, Quality, Duration, Modifying Factors, Severity)  Note limiting factors.   Pt w hx of CHF w right leg swelling worse than left.  Recent hospitalization.  She has chronic lower extremity edema that is slightly worse than usual since decreasing her Bumex dose on her last hospitalization; however, the edema was symmetric until about 3 days ago when she developed increased tightness and swelling in her right leg extending up to her thigh and worse with standing.  No skin changes, fevers, or increased SOB.  On chronic O2 with no change in O2 requirements.  Has had some fatigue but no lightheadedness or dizziness.  Reports increased thirst.            Review of External Medical Records:     Nursing Notes were reviewed.    REVIEW OF SYSTEMS    (2-9 systems for level 4, 10 or more for level 5)     Review of Systems    Except as noted above the remainder of the review of systems was reviewed and negative.       PAST MEDICAL HISTORY     Past Medical History:   Diagnosis Date    (HFpEF) heart failure with preserved ejection fraction (HCC)     Anxiety and depression     Aortic valve replaced     S/p bovine aortic valve replacement.    Asthma     Chronic narcotic use     Chronic obstructive pulmonary disease (HCC)     Chronic pain     CKD (chronic kidney disease), stage III (HCC)     Baseline creatinine is 1.3-1.4 with GFR in the 40s.    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)     GERD (gastroesophageal reflux disease)     History of vascular access device 04/13/2021    4 FR Single PICC for LTABX: R cephalic vessell length 48 CM Max P leave @ 1 CM out; Arm circumferenc 40 CM    Hyperlipidemia     Hypothyroidism      Morbid obesity (HCC)     Neuropathy     Obstructive sleep apnea     Rhinitis          SURGICAL HISTORY       Past Surgical History:   Procedure Laterality Date    AORTIC VALVE REPLACEMENT      Bovine bioprosthetic    COLONOSCOPY N/A 12/01/2020    COLONOSCOPY performed by Ian Malkin, [facility] at [facility] ENDOSCOPY    PACEMAKER           CURRENT MEDICATIONS       Previous Medications    ACETAMINOPHEN (TYLENOL) 500 MG TABLET    Take 2 tablets by mouth every 6 hours as needed    ALBUTEROL SULFATE HFA (PROVENTIL;VENTOLIN;PROAIR) 108 (90 BASE) MCG/ACT INHALER    Inhale 2 puffs into the lungs every 6 hours as needed    ARFORMOTEROL TARTRATE (BROVANA) 15 MCG/2ML NEBU    Inhale 2 mLs into the lungs 2 times daily    ASPIRIN 81 MG CHEWABLE TABLET    Take 1 tablet by mouth daily  ATORVASTATIN (LIPITOR) 10 MG TABLET    Take 1 tablet by mouth daily    BUDESONIDE (PULMICORT) 0.5 MG/2ML NEBULIZER SUSPENSION    Inhale 2 mLs into the lungs 2 times daily    BUMETANIDE (BUMEX) 1 MG TABLET    Take 1 tablet by mouth daily    CITALOPRAM (CELEXA) 40 MG TABLET    Take 1 tablet by mouth daily    FLUTICASONE (FLONASE) 50 MCG/ACT NASAL SPRAY    2 sprays by Nasal route daily as needed    GLIMEPIRIDE (AMARYL) 2 MG TABLET    Take 1 tablet by mouth 2 times daily    LEVOTHYROXINE (SYNTHROID) 150 MCG TABLET    Take 1 tablet by mouth every morning (before breakfast)    LIDOCAINE 4 % EXTERNAL PATCH    Apply to low back  Apply patch to the affected area for 12 hours a day and remove for 12 hours a day.    METOPROLOL SUCCINATE (TOPROL XL) 25 MG EXTENDED RELEASE TABLET    Take 1 tablet by mouth daily    MICONAZOLE (MICOTIN) 2 % POWDER    Apply topically 2 times daily    MONTELUKAST (SINGULAIR) 10 MG TABLET    Take 1 tablet by mouth nightly    NALOXONE 4 MG/0.1ML LIQD NASAL SPRAY    Use 1 spray intranasally, then discard. Repeat with new spray every 2 min as needed for opioid overdose symptoms, alternating nostrils.    POLYETHYLENE GLYCOL (GLYCOLAX) 17 GM/SCOOP  POWDER    Take 17 g by mouth daily as needed    SUCRALFATE (CARAFATE) 1 GM TABLET    Take 1 tablet by mouth 3 times daily    TRAZODONE (DESYREL) 100 MG TABLET    Take 1 tablet by mouth nightly       ALLERGIES     Nitroglycerin, Hydrochlorothiazide, and Aloe vera    FAMILY HISTORY       Family History   Problem Relation Age of Onset    Hypertension Mother     Hypertension Father           SOCIAL HISTORY       Social History     Socioeconomic History    Marital status: Single   Tobacco Use    Smoking status: Former     Packs/day: 1.00     Types: Cigarettes     Quit date: 03/21/2010     Years since quitting: 11.2    Smokeless tobacco: Never   Substance and Sexual Activity    Alcohol use: Not Currently    Drug use: Never           PHYSICAL EXAM    (up to 7 for level 4, 8 or more for level 5)     ED Triage Vitals [06/16/21 1944]   BP Temp Temp Source Pulse Respirations SpO2 Height Weight - Scale   112/78 98.7 F (37.1 C) Oral 70 22 97 % 5\' 7"  (1.702 m) (!) 326 lb (147.9 kg)       Body mass index is 51.06 kg/m.    Physical Exam  Constitutional:       Appearance: Normal appearance. She is not ill-appearing.   HENT:      Head: Normocephalic and atraumatic.      Mouth/Throat:      Mouth: Mucous membranes are moist.   Eyes:      Conjunctiva/sclera: Conjunctivae normal.   Cardiovascular:      Rate and Rhythm: Normal  rate and regular rhythm.      Pulses:           Popliteal pulses are 2+ on the right side and 2+ on the left side.      Heart sounds: Normal heart sounds.   Pulmonary:      Effort: Pulmonary effort is normal.      Breath sounds: Normal breath sounds.   Abdominal:      General: There is no distension.      Palpations: Abdomen is soft.      Tenderness: There is no abdominal tenderness.   Musculoskeletal:      Right lower leg: 4+ Edema present.      Left lower leg: 3+ Edema present.   Skin:     General: Skin is warm and dry.   Neurological:      General: No focal deficit present.      Mental Status: She is alert and  oriented to person, place, and time.   Psychiatric:         Mood and Affect: Mood normal.         Behavior: Behavior normal.         Thought Content: Thought content normal.         Judgment: Judgment normal.       DIAGNOSTIC RESULTS     EKG: All EKG's are interpreted by the Emergency Department Physician who either signs or Co-signs this chart in the absence of a cardiologist.        RADIOLOGY:   Non-plain film images such as CT, Ultrasound and MRI are read by the radiologist. Plain radiographic images are visualized and preliminarily interpreted by the emergency physician with the below findings:        Interpretation per the Radiologist below, if available at the time of this note:    Vascular duplex lower extremity venous right              LABS:  Labs Reviewed   BASIC METABOLIC PANEL - Abnormal; Notable for the following components:       Result Value    Potassium 2.9 (*)     CO2 34 (*)     Anion Gap 1 (*)     Glucose 166 (*)     Creatinine 1.54 (*)     Bun/Cre Ratio 10 (*)     Est, Glom Filt Rate 37 (*)     All other components within normal limits   CBC WITH AUTO DIFFERENTIAL - Abnormal; Notable for the following components:    WBC 3.3 (*)     RBC 3.55 (*)     Hemoglobin 9.9 (*)     Hematocrit 32.7 (*)     RDW 16.8 (*)     Platelets 92 (*)     Neutrophils Absolute 1.5 (*)     All other components within normal limits   BRAIN NATRIURETIC PEPTIDE - Abnormal; Notable for the following components:    NT Pro-BNP 3,228 (*)     All other components within normal limits   VENOUS BLOOD GAS, POINT OF CARE - Abnormal; Notable for the following components:    PCO2, Venus, POC 59.5 (*)     HCO3, Venous 33.8 (*)     All other components within normal limits   EXTRA TUBES HOLD   BLOOD GAS, VENOUS       All other labs were within normal range or not returned as of this dictation.    EMERGENCY  DEPARTMENT COURSE and DIFFERENTIAL DIAGNOSIS/MDM:   Vitals:    Vitals:    06/16/21 1944   BP: 112/78   Pulse: 70   Resp: 22   Temp:  98.7 F (37.1 C)   TempSrc: Oral   SpO2: 97%   Weight: (!) 147.9 kg (326 lb)   Height: 1.702 m (5\' 7" )           Medical Decision Making  Amount and/or Complexity of Data Reviewed  Labs: ordered.    Risk  Prescription drug management.    Patient is a 66 year old woman who comes into the emergency department with worsening lower extremity swelling.  Duplex of the right lower extremity was negative for DVT, exam negative for cellulitis or limb ischemia.  Chest x-ray and CTA were considered; however, the patient has no increased shortness of breath to indicate worsening pulmonary edema or new pulmonary embolism.  Potassium was slightly low and replenished in the emergency department.  Patient will follow-up with her cardiologist for continued management of her congestive heart failure, diuresis, and potassium.    The patient's results have been reviewed with them and/or available family. Patient and/or family verbally conveyed their understanding and agreement of the patient's signs, symptoms, diagnosis, treatment and prognosis and additionally agree to follow up as recommended in the discharge instructions or to return to the Emergency Room should their condition change prior to their follow-up appointment. The patient/family verbally agrees with the care-plan and verbally conveys that all of their questions have been answered. The discharge instructions have also been provided to the patient and/or family with some educational information regarding the patient's diagnosis as well a list of reasons why the patient would want to return to the ER prior to their follow-up appointment, should their condition change.      FINAL IMPRESSION      1. Peripheral edema          DISPOSITION/PLAN   DISPOSITION Decision To Discharge 06/16/2021 11:06:19 PM      PATIENT REFERRED TO:  Thurston PoundsMatthew Ngo, MD  [id number]  [place] [id number]  [phone]    Schedule an appointment as soon as possible for a visit in 3  days        DISCHARGE MEDICATIONS:  New Prescriptions    No medications on file         (Please note that portions of this note were completed with a voice recognition program.  Efforts were made to edit the dictations but occasionally words are mis-transcribed.)    Hazeline JunkerImani Cerina Leary, MD (electronically signed)  Emergency Attending Physician / Physician Assistant / Nurse Practitioner              Carolyn StareImani A Krista Godsil, MD  06/16/21 [phone]

## 2021-06-16 NOTE — Progress Notes (Signed)
RLE venous duplex completed. Preliminary report given to Dr. Agustin Cree.

## 2021-06-17 LAB — BASIC METABOLIC PANEL
Anion Gap: 1 mmol/L — ABNORMAL LOW (ref 5–15)
BUN: 16 MG/DL (ref 6–20)
Bun/Cre Ratio: 10 — ABNORMAL LOW (ref 12–20)
CO2: 34 mmol/L — ABNORMAL HIGH (ref 21–32)
Calcium: 8.6 MG/DL (ref 8.5–10.1)
Chloride: 108 mmol/L (ref 97–108)
Creatinine: 1.54 MG/DL — ABNORMAL HIGH (ref 0.55–1.02)
Est, Glom Filt Rate: 37 mL/min/{1.73_m2} — ABNORMAL LOW (ref >60)
Glucose: 166 mg/dL — ABNORMAL HIGH (ref 65–100)
Potassium: 2.9 mmol/L — ABNORMAL LOW (ref 3.5–5.1)
Sodium: 143 mmol/L (ref 136–145)

## 2021-06-17 LAB — EXTRA TUBES HOLD

## 2021-06-17 LAB — CBC WITH AUTO DIFFERENTIAL
Absolute Immature Granulocyte: 0 10*3/uL (ref 0.00–0.04)
Basophils %: 1 % (ref 0–1)
Basophils Absolute: 0 10*3/uL (ref 0.0–0.1)
Eosinophils %: 5 % (ref 0–7)
Eosinophils Absolute: 0.2 10*3/uL (ref 0.0–0.4)
Hematocrit: 32.7 % — ABNORMAL LOW (ref 35.0–47.0)
Hemoglobin: 9.9 g/dL — ABNORMAL LOW (ref 11.5–16.0)
Immature Granulocytes: 0 % (ref 0.0–0.5)
Lymphocytes %: 42 % (ref 12–49)
Lymphocytes Absolute: 1.4 10*3/uL (ref 0.8–3.5)
MCH: 27.9 PG (ref 26.0–34.0)
MCHC: 30.3 g/dL (ref 30.0–36.5)
MCV: 92.1 FL (ref 80.0–99.0)
MPV: 10.6 FL (ref 8.9–12.9)
Monocytes %: 7 % (ref 5–13)
Monocytes Absolute: 0.2 10*3/uL (ref 0.0–1.0)
Neutrophils %: 45 % (ref 32–75)
Neutrophils Absolute: 1.5 10*3/uL — ABNORMAL LOW (ref 1.8–8.0)
Nucleated RBCs: 0 PER 100 WBC
Platelets: 92 10*3/uL — ABNORMAL LOW (ref 150–400)
RBC: 3.55 M/uL — ABNORMAL LOW (ref 3.80–5.20)
RDW: 16.8 % — ABNORMAL HIGH (ref 11.5–14.5)
WBC: 3.3 10*3/uL — ABNORMAL LOW (ref 3.6–11.0)
nRBC: 0 10*3/uL (ref 0.00–0.01)

## 2021-06-17 LAB — VENOUS BLOOD GAS, POINT OF CARE
BASE EXCESS, VENOUS (POC): 6.6 mmol/L
HCO3, Venous: 33.8 MMOL/L — ABNORMAL HIGH (ref 23.0–28.0)
PCO2, Venus, POC: 59.5 MMHG — ABNORMAL HIGH (ref 41–51)
PH, VENOUS (POC): 7.36 (ref 7.32–7.42)
PO2, VENOUS (POC): 37 mmHg (ref 25–40)
SO2, VENOUS (POC): 66.9 % (ref 65–88)

## 2021-06-17 LAB — BRAIN NATRIURETIC PEPTIDE: NT Pro-BNP: 3228 PG/ML — ABNORMAL HIGH (ref <125)

## 2021-06-17 MED ORDER — POTASSIUM CHLORIDE ER 10 MEQ PO TBCR
10 MEQ | Freq: Once | ORAL | Status: AC
Start: 2021-06-17 — End: 2021-06-16
  Administered 2021-06-17: 03:00:00 40 meq via ORAL

## 2021-06-17 MED FILL — POTASSIUM CHLORIDE ER 10 MEQ PO TBCR: 10 MEQ | ORAL | Qty: 4

## 2021-06-18 ENCOUNTER — Telehealth

## 2021-06-18 MED ORDER — POTASSIUM CHLORIDE CRYS ER 20 MEQ PO TBCR
20 MEQ | ORAL_TABLET | Freq: Every day | ORAL | 5 refills | Status: DC
Start: 2021-06-18 — End: 2021-06-28

## 2021-06-18 NOTE — Telephone Encounter (Signed)
Pt stated nurse had wrong fax number the correct one is, [phone]

## 2021-06-18 NOTE — Telephone Encounter (Signed)
Attempted to reach patient by telephone. A message was left for return call.

## 2021-06-18 NOTE — Telephone Encounter (Signed)
Pt called and would like to speak to nurse stated she was seen at ER on Saturday for leg swelling,please advise    Pt # [phone]

## 2021-06-18 NOTE — Telephone Encounter (Signed)
Verified patient with two types of identifiers. Patient states she started noticing fluid on her legs on Thursday. She made adjustments to her diet and continued Bumex 1 mg daily. On Friday she noticed that her right leg was more swollen than her left. She was advised by her [facility] nurse to go to the ER to be evaluated for DVT. No DVT found. She states legs are still swollen and now the right leg is leaking. Today she noticed some increased SOB. Her home scale is broken. Labs at ER K 2.9 Creatinine 1.54. She states they gave her 4 tabs of Potassium at the ER. Discussed with Sharyl Nimrod, NP. Patient to increase her Bumex to 2 mg daily for the next 3 days, then decrease to 1 mg daily. Patient to start Potassium Chloride 40 meq daily and recheck labs in 1 week. Patient to continue to monitor symptoms. If no improvement may need to go back to ER for IV diuresis. Patient verbalized understanding and will call with any other questions.

## 2021-06-19 NOTE — Telephone Encounter (Signed)
Faxed NP recommendations regarding diclofenac to Dr. Jonell Cluck at fax number [phone].  Fax confirmation received.

## 2021-06-20 ENCOUNTER — Emergency Department: Admit: 2021-06-20 | Payer: MEDICARE | Primary: Family

## 2021-06-20 ENCOUNTER — Inpatient Hospital Stay
Admission: EM | Admit: 2021-06-20 | Discharge: 2021-06-28 | Disposition: A | Discharge status: Home Health | Payer: MEDICARE | Admitting: Internal Medicine

## 2021-06-20 DIAGNOSIS — R601 Generalized edema: Secondary | ICD-10-CM

## 2021-06-20 DIAGNOSIS — I130 Hypertensive heart and chronic kidney disease with heart failure and stage 1 through stage 4 chronic kidney disease, or unspecified chronic kidney disease: Principal | ICD-10-CM

## 2021-06-20 LAB — COMPREHENSIVE METABOLIC PANEL
ALT: 11 U/L — ABNORMAL LOW (ref 12–78)
AST: 12 U/L — ABNORMAL LOW (ref 15–37)
Albumin/Globulin Ratio: 1 — ABNORMAL LOW (ref 1.1–2.2)
Albumin: 3.3 g/dL — ABNORMAL LOW (ref 3.5–5.0)
Alk Phosphatase: 71 U/L (ref 45–117)
Anion Gap: 2 mmol/L — ABNORMAL LOW (ref 5–15)
BUN: 18 MG/DL (ref 6–20)
Bun/Cre Ratio: 11 — ABNORMAL LOW (ref 12–20)
CO2: 33 mmol/L — ABNORMAL HIGH (ref 21–32)
Calcium: 8.6 MG/DL (ref 8.5–10.1)
Chloride: 106 mmol/L (ref 97–108)
Creatinine: 1.69 MG/DL — ABNORMAL HIGH (ref 0.55–1.02)
Est, Glom Filt Rate: 34 mL/min/{1.73_m2} — ABNORMAL LOW (ref >60)
Globulin: 3.3 g/dL (ref 2.0–4.0)
Glucose: 170 mg/dL — ABNORMAL HIGH (ref 65–100)
Potassium: 4.1 mmol/L (ref 3.5–5.1)
Sodium: 141 mmol/L (ref 136–145)
Total Bilirubin: 2 MG/DL — ABNORMAL HIGH (ref 0.2–1.0)
Total Protein: 6.6 g/dL (ref 6.4–8.2)

## 2021-06-20 LAB — CBC WITH AUTO DIFFERENTIAL
Absolute Immature Granulocyte: 0 10*3/uL (ref 0.00–0.04)
Basophils %: 1 % (ref 0–1)
Basophils Absolute: 0 10*3/uL (ref 0.0–0.1)
Eosinophils %: 3 % (ref 0–7)
Eosinophils Absolute: 0.1 10*3/uL (ref 0.0–0.4)
Hematocrit: 32.4 % — ABNORMAL LOW (ref 35.0–47.0)
Hemoglobin: 9.6 g/dL — ABNORMAL LOW (ref 11.5–16.0)
Immature Granulocytes: 0 % (ref 0.0–0.5)
Lymphocytes %: 35 % (ref 12–49)
Lymphocytes Absolute: 1.2 10*3/uL (ref 0.8–3.5)
MCH: 27.7 PG (ref 26.0–34.0)
MCHC: 29.6 g/dL — ABNORMAL LOW (ref 30.0–36.5)
MCV: 93.4 FL (ref 80.0–99.0)
MPV: 10.7 FL (ref 8.9–12.9)
Monocytes %: 7 % (ref 5–13)
Monocytes Absolute: 0.2 10*3/uL (ref 0.0–1.0)
Neutrophils %: 54 % (ref 32–75)
Neutrophils Absolute: 1.8 10*3/uL (ref 1.8–8.0)
Nucleated RBCs: 0 PER 100 WBC
Platelets: 86 10*3/uL — ABNORMAL LOW (ref 150–400)
RBC: 3.47 M/uL — ABNORMAL LOW (ref 3.80–5.20)
RDW: 16.5 % — ABNORMAL HIGH (ref 11.5–14.5)
WBC: 3.3 10*3/uL — ABNORMAL LOW (ref 3.6–11.0)
nRBC: 0 10*3/uL (ref 0.00–0.01)

## 2021-06-20 LAB — VAS DUP LOWER EXTREMITY VENOUS RIGHT: Body Surface Area: 2.64 m2

## 2021-06-20 LAB — TROPONIN: Troponin, High Sensitivity: 15 ng/L (ref 0–51)

## 2021-06-20 LAB — BRAIN NATRIURETIC PEPTIDE: NT Pro-BNP: 3147 PG/ML — ABNORMAL HIGH (ref <125)

## 2021-06-20 LAB — EXTRA TUBES HOLD

## 2021-06-20 NOTE — Telephone Encounter (Signed)
Verified patient with two types of identifiers. Notified patient that Dr. Loma Newton recommends patient go to [facility] ER for further evaluation for possible CHF exacerbation. She agrees with plan. Patient verbalized understanding and will call with any other questions.      Future Appointments   Date Time Provider Department Center   08/10/2021 10:00 AM SS INF7 CH1 LAB [facility]   08/10/2021 10:30 AM [place] Malachy Chamber, MD [facility] BS AMB   08/14/2021 10:20 AM Jillyn Ledger, APRN - NP [facility] BS AMB   09/11/2021 10:20 AM PACEMAKER, [facility]ANCES [facility] BS AMB   09/11/2021 10:40 AM Jillyn Ledger, APRN - NP [facility] BS AMB   03/13/2022  2:00 PM PACEMAKER, [facility]FRANCES [facility] BS AMB   03/13/2022  2:20 PM Thurston Pounds, MD [facility] BS AMB

## 2021-06-20 NOTE — ED Triage Notes (Signed)
Pt report SOB and lower leg edema. Report fluid around abdomen. Reports recent medication adjustments.

## 2021-06-20 NOTE — ED Provider Notes (Signed)
HPI   Patient is a 66 y.o. female with history of diastolic heart failure, aortic valve replacement, COPD on supplemental oxygen, CKD, obesity who presents today with complaints of lightheadedness, increased shortness of breath, peripheral edema.  States on May 2, she was hypotensive, cardiologist decrease Bumex from 1 mg twice daily to 1 mg once daily.  Over the last couple weeks since decreasing Bumex, patient reports increased dyspnea on exertion, peripheral edema.  Saw cardiologist again 2 days ago, Bumex was increased back to 1 mg twice daily.  Since increasing the Bumex, patient reports she is lightheaded, having low blood pressure readings, feeling dehydrated. No syncope, chest pain, focal weakness.     ALLERGIES: Nitroglycerin, Hydrochlorothiazide, and Aloe vera    Past Medical History:   Diagnosis Date    (HFpEF) heart failure with preserved ejection fraction (HCC)     Anxiety and depression     Aortic valve replaced     S/p bovine aortic valve replacement.    Asthma     Chronic narcotic use     Chronic obstructive pulmonary disease (HCC)     Chronic pain     CKD (chronic kidney disease), stage III (HCC)     Baseline creatinine is 1.3-1.4 with GFR in the 40s.    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)     GERD (gastroesophageal reflux disease)     History of vascular access device 04/13/2021    4 FR Single PICC for LTABX: R cephalic vessell length 48 CM Max P leave @ 1 CM out; Arm circumferenc 40 CM    Hyperlipidemia     Hypothyroidism     Morbid obesity (HCC)     Neuropathy     Obstructive sleep apnea     Rhinitis      Past Surgical History:   Procedure Laterality Date    AORTIC VALVE REPLACEMENT      Bovine bioprosthetic    COLONOSCOPY N/A 12/01/2020    COLONOSCOPY performed by Ian Malkin, MD at [facility] ENDOSCOPY    PACEMAKER         Review of Systems   Constitutional:  Negative for fever.   HENT:  Negative for congestion.    Eyes:  Negative for discharge.   Respiratory:  Positive for shortness of breath.     Cardiovascular:  Positive for leg swelling. Negative for chest pain.   Gastrointestinal:  Negative for abdominal pain.   Genitourinary:  Negative for dysuria.   Skin:  Negative for wound.   Neurological:  Positive for light-headedness. Negative for seizures.   Psychiatric/Behavioral:  Negative for suicidal ideas.      Vitals:    06/20/21 1817   BP: (!) 105/49   Pulse: 61   Resp: 20   Temp: 98.4 F (36.9 C)   TempSrc: Oral   SpO2: 96%   Weight: (!) 154.7 kg (341 lb)   Height: 1.702 m (5\' 7" )            Physical Exam  Vitals and nursing note reviewed.   Constitutional:       General: She is not in acute distress.     Appearance: Normal appearance. She is obese. She is not ill-appearing, toxic-appearing or diaphoretic.      Comments: On 2L NC   HENT:      Head: Normocephalic and atraumatic.      Right Ear: External ear normal.      Left Ear: External ear normal.  Nose: Nose normal.      Mouth/Throat:      Mouth: Mucous membranes are dry.   Eyes:      General:         Right eye: No discharge.         Left eye: No discharge.   Cardiovascular:      Rate and Rhythm: Normal rate and regular rhythm.      Heart sounds: Heart sounds are distant.   Pulmonary:      Effort: Pulmonary effort is normal. No respiratory distress.      Breath sounds: Decreased breath sounds present.   Abdominal:      General: Abdomen is flat. There is distension.   Musculoskeletal:         General: No deformity.      Cervical back: Neck supple.      Right lower leg: 2+ Pitting Edema present.      Left lower leg: 2+ Pitting Edema present.   Skin:     General: Skin is warm and dry.      Capillary Refill: Capillary refill takes less than 2 seconds.      Coloration: Skin is pale.   Neurological:      General: No focal deficit present.      Mental Status: She is alert. Mental status is at baseline.   Psychiatric:         Mood and Affect: Mood normal.         Behavior: Behavior normal. Behavior is cooperative.         Thought Content: Thought content  normal.            LABORATORY RESULTS:  Labs Reviewed   CBC WITH AUTO DIFFERENTIAL - Abnormal; Notable for the following components:       Result Value    WBC 3.3 (*)     RBC 3.47 (*)     Hemoglobin 9.6 (*)     Hematocrit 32.4 (*)     MCHC 29.6 (*)     RDW 16.5 (*)     Platelets 86 (*)     All other components within normal limits   COMPREHENSIVE METABOLIC PANEL - Abnormal; Notable for the following components:    CO2 33 (*)     Anion Gap 2 (*)     Glucose 170 (*)     Creatinine 1.69 (*)     Bun/Cre Ratio 11 (*)     Est, Glom Filt Rate 34 (*)     Total Bilirubin 2.0 (*)     ALT 11 (*)     AST 12 (*)     Albumin 3.3 (*)     Albumin/Globulin Ratio 1.0 (*)     All other components within normal limits   BRAIN NATRIURETIC PEPTIDE - Abnormal; Notable for the following components:    NT Pro-BNP 3,147 (*)     All other components within normal limits   EXTRA TUBES HOLD   TROPONIN       IMAGING RESULTS:  XR CHEST (2 VW)   Final Result   1. No definite acute cardiopulmonary disease.              MEDICATIONS GIVEN:  Medications - No data to display         Medical Decision Making  Amount and/or Complexity of Data Reviewed  Labs: ordered.  Radiology: ordered.  ECG/medicine tests: ordered.    Risk  Decision regarding hospitalization.  ED Course as of 06/20/21 2051   Wed Jun 20, 2021   1759 EKG 1756  Independent evaluation shows a sinus rhythm at 82 bpm.  Frequent PVCs.  There is a right bundle branch block pattern with axis deviation.  No obvious STEMI [GG]      ED Course User Index  [GG] Sherril Croon, DO     Perfect Serve Consult for Admission  8:51 PM    ED Room Number: V05/V05  Patient Name and age:  Deborah [name] 66 y.o.  female  Working Diagnosis:   1. Anasarca    2. Dehydration    3. Hypotension, unspecified hypotension type    4. Acute on chronic heart failure, unspecified heart failure type (HCC)        COVID-19 Suspicion: No  Sepsis present:  No  Reassessment needed: No  Code Status:  Full Code  Readmission:  No  Isolation Requirements: no  Recommended Level of Care: telemetry  Department: [facility] - [phone]  Consulting Provider: Dr. Obie Dredge    Other:  Patient is a 66 y.o. female with history of diastolic heart failure, aortic valve replacement, COPD on supplemental oxygen, CKD, obesity who presents today with complaints of lightheadedness, increased shortness of breath, peripheral edema.  States on May 2, she was hypotensive, cardiologist decrease Bumex from 1 mg twice daily to 1 mg once daily.  Over the last couple weeks since decreasing Bumex, patient reports increased dyspnea on exertion, peripheral edema.  Saw cardiologist again 2 days ago, Bumex was increased back to 1 mg twice daily.  Since increasing the Bumex, patient reports she is lightheaded, having low blood pressure readings, feeling dehydrated.  BNP here is 3147, creatinine 1.69, GFR 34. (Baseline GFR around 1.07).  Hypotensive with blood pressure 105/49.  Plan to admit for anasarca, acute on chronic heart failure, dehydration and hypotension.         History, exam, medical decision making, and plan discussed with ED attending, Dr. Elonda Husky.    IMPRESSION:  1. Anasarca    2. Dehydration    3. Hypotension, unspecified hypotension type    4. Acute on chronic heart failure, unspecified heart failure type [facility])        DISPOSITION:  Admit  Please note that this dictation was completed with Dragon, the computer voice recognition software.  Quite often unanticipated grammatical, syntax, homophones, and other interpretive errors are inadvertently transcribed by the computer software.  Please disregard these errors.  Please excuse any errors that have escaped final proofreading.       Juliene Pina, PA-C  06/20/21 2051

## 2021-06-20 NOTE — Telephone Encounter (Signed)
Pt called and stated dr put her on a new medications, and stated she feel like she be about to pass out,please advise    Pt # [phone]

## 2021-06-20 NOTE — Telephone Encounter (Signed)
Verified patient with two types of identifiers. Patient states she increased the Bumex on Monday and Tuesday she was a little light headed. Today when she stood up she felt like she was going pass out. Symptoms resolved when she sat back down. Her blood pressure cuff is across the house and she is too scared to get up and go get it. She has not noticed any improvement since increasing her Bumex. She states there is no more weeping but fees like she's starting to retain fluid in her abdomen. Will notify MD for recommendations.

## 2021-06-20 NOTE — H&P (Signed)
Hospitalist Admission Note    NAME: Deborah [name]   DOB:  Nov 07, 1955   MRN:  [id number]     Date/Time:  06/20/2021 11:57 PM    Patient PCP: [name], APRN - NP       Subjective:   CHIEF COMPLAINT: Dyspnea    HISTORY OF PRESENT ILLNESS:     Deborah [name] is a 66 y.o.  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  The patient states that she has been short of breath with ambulation.  She stated that she was taking Bumex twice a day and recently her cardiologist decreased to once a day due to borderline hypotension.  She began to have increasing lower extremity edema and dyspnea as her cardiologist increased again to 2 tablets/day.  However this dose increase did not help.  She presented to the ER for evaluation where she was found to have anasarca, dehydration.  She denies any chest pain, palpitations, syncope, lightheadedness, dizziness, or poor p.o. intake.  She is being admitted to the hospitalist service for further evaluation and treatment.      Past Medical History:   Diagnosis Date    (HFpEF) heart failure with preserved ejection fraction (HCC)     Anxiety and depression     Aortic valve replaced     S/p bovine aortic valve replacement.    Asthma     Chronic narcotic use     Chronic obstructive pulmonary disease (HCC)     Chronic pain     CKD (chronic kidney disease), stage III (HCC)     Baseline creatinine is 1.3-1.4 with GFR in the 40s.    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)     GERD (gastroesophageal reflux disease)     History of vascular access device 04/13/2021    4 FR Single PICC for LTABX: R cephalic vessell length 48 CM Max P leave @ 1 CM out; Arm circumferenc 40 CM    Hyperlipidemia     Hypothyroidism     Morbid obesity (HCC)     Neuropathy     Obstructive sleep apnea     Rhinitis       Past Surgical History:   Procedure Laterality Date    AORTIC VALVE REPLACEMENT      Bovine bioprosthetic    COLONOSCOPY N/A 12/01/2020    COLONOSCOPY  performed by Ian Malkin, MD at [facility] ENDOSCOPY    PACEMAKER       Social History     Tobacco Use    Smoking status: Former     Packs/day: 1.00     Types: Cigarettes     Quit date: 03/21/2010     Years since quitting: 11.2    Smokeless tobacco: Never   Substance Use Topics    Alcohol use: Not Currently      Family History   Problem Relation Age of Onset    Hypertension Mother     Hypertension Father           Allergies   Allergen Reactions    Nitroglycerin      Other reaction(s): Unknown (comments)  hypotension    Hydrochlorothiazide Other (See Comments)     Reports 'kidneys dry up"     Aloe Vera Rash        Prior to Admission medications    Medication Sig Start Date End Date Taking? Authorizing Provider   potassium chloride (KLOR-CON M) 20 MEQ extended  release tablet Take 2 tablets by mouth daily 06/18/21   French Ana, APRN - NP   acetaminophen (TYLENOL) 500 MG tablet Take 2 tablets by mouth every 6 hours as needed    Ar Automatic Reconciliation   albuterol sulfate HFA (PROVENTIL;VENTOLIN;PROAIR) 108 (90 Base) MCG/ACT inhaler Inhale 2 puffs into the lungs every 6 hours as needed    Ar Automatic Reconciliation   arformoterol tartrate (BROVANA) 15 MCG/2ML NEBU Inhale 2 mLs into the lungs 2 times daily 04/24/21   Ar Automatic Reconciliation   aspirin 81 MG chewable tablet Take 1 tablet by mouth daily 12/03/20   Ar Automatic Reconciliation   atorvastatin (LIPITOR) 10 MG tablet Take 1 tablet by mouth daily    Ar Automatic Reconciliation   budesonide (PULMICORT) 0.5 MG/2ML nebulizer suspension Inhale 2 mLs into the lungs 2 times daily 04/24/21   Ar Automatic Reconciliation   bumetanide (BUMEX) 1 MG tablet Take 1 tablet by mouth daily 04/25/21   Ar Automatic Reconciliation   citalopram (CELEXA) 40 MG tablet Take 1 tablet by mouth daily 02/22/20   Ar Automatic Reconciliation   fluticasone (FLONASE) 50 MCG/ACT nasal spray 2 sprays by Nasal route daily as needed    Ar Automatic Reconciliation   glimepiride (AMARYL) 2 MG  tablet Take 1 tablet by mouth 2 times daily    Ar Automatic Reconciliation   levothyroxine (SYNTHROID) 150 MCG tablet Take 1 tablet by mouth every morning (before breakfast) 01/27/21   Ar Automatic Reconciliation   lidocaine 4 % external patch Apply to low back  Apply patch to the affected area for 12 hours a day and remove for 12 hours a day. 04/24/21   Ar Automatic Reconciliation   metoprolol succinate (TOPROL XL) 25 MG extended release tablet Take 1 tablet by mouth daily 02/21/21   Ar Automatic Reconciliation   miconazole (MICOTIN) 2 % powder Apply topically 2 times daily 04/24/21   Ar Automatic Reconciliation   montelukast (SINGULAIR) 10 MG tablet Take 1 tablet by mouth nightly    Ar Automatic Reconciliation   naloxone 4 MG/0.1ML LIQD nasal spray Use 1 spray intranasally, then discard. Repeat with new spray every 2 min as needed for opioid overdose symptoms, alternating nostrils. 04/24/21   Ar Automatic Reconciliation   polyethylene glycol (GLYCOLAX) 17 GM/SCOOP powder Take 17 g by mouth daily as needed 04/24/21   Ar Automatic Reconciliation   sucralfate (CARAFATE) 1 GM tablet Take 1 tablet by mouth 3 times daily    Ar Automatic Reconciliation   traZODone (DESYREL) 100 MG tablet Take 1 tablet by mouth nightly 12/31/19   Ar Automatic Reconciliation       REVIEW OF SYSTEMS:      General:   No fever and chills  Eyes: No changes in vision.  ENT:  No rhinorrhea, pharyngitis, or congestion  Lungs:  No cough or sputum production.  No wheezing.  Positive lower extremity edema and dyspnea on exertion.  Heart:   No chest pain or palpitations   GI:  No abdominal pain, N/V, diarrhea, constipation  GU:  No dysuria or hematuria  MSK:  No acute pain or trauma  Skin:  No new lesions  Neuro:  No focal numbess or weakness  Psych:  No mood changes.      Objective:   VITALS:    Vitals:    06/20/21 2300   BP:    Pulse: 81   Resp:    Temp:    SpO2:  PHYSICAL EXAM:      General:   No acute distress, resting comfortably in bed.   Obese  Head:  Normocephalic, atraumatic  Eyes:  PERRLA.  EOM intact.  ENT:  Moist mucus membranes.  No oral lesions.  Neck:  Supple.  No LAD.  Heart:  RRR, No murmurs.  Lungs:  CTAB.  Non-labored breathing.  On mid flow 12 L  Abdomen:  Soft .  Nondistended.  NTTP.  BS present.  GU:  No suprapubic tenderness.  No foley.  Extremities:  No edema.  Musculoskeletal:  Atraumatic.  No gross deformity.  Skin:  No rash  Neuro:  Alert and interactive.  No focal weakness.  Psych:  Mood stable.          Procedures: see electronic medical records for all procedures/Xrays and details which were not copied into this note but were reviewed prior to creation of Plan.    LAB DATA REVIEWED:    Recent Results (from the past 24 hour(s))   EKG 12 Lead    Collection Time: 06/20/21  5:56 PM   Result Value Ref Range    Ventricular Rate 82 BPM    Atrial Rate 82 BPM    P-R Interval 176 ms    QRS Duration 158 ms    Q-T Interval 378 ms    QTc Calculation (Bazett) 441 ms    P Axis 64 degrees    R Axis 226 degrees    T Axis 33 degrees    Diagnosis       Sinus rhythm with frequent atrial-paced complexes and premature   supraventricular complexes in a pattern of bigeminy  Right bundle branch block  Lateral infarct , age undetermined  Inferior infarct , age undetermined  Abnormal ECG  When compared with ECG of 18-May-2021 19:16,  Electronic atrial pacemaker has replaced Electronic ventricular pacemaker  Vent. rate has decreased BY  41 BPM     Extra Tubes Hold    Collection Time: 06/20/21  6:22 PM   Result Value Ref Range    Specimen HOld 1BLU,1RED,1SST     Comment:        Add-on orders for these samples will be processed based on acceptable specimen integrity and analyte stability, which may vary by analyte.   CBC with Auto Differential    Collection Time: 06/20/21  6:22 PM   Result Value Ref Range    WBC 3.3 (L) 3.6 - 11.0 K/uL    RBC 3.47 (L) 3.80 - 5.20 M/uL    Hemoglobin 9.6 (L) 11.5 - 16.0 g/dL    Hematocrit 16.1 (L) 35.0 - 47.0 %    MCV 93.4  80.0 - 99.0 FL    MCH 27.7 26.0 - 34.0 PG    MCHC 29.6 (L) 30.0 - 36.5 g/dL    RDW 09.6 (H) 04.5 - 14.5 %    Platelets 86 (L) 150 - 400 K/uL    MPV 10.7 8.9 - 12.9 FL    Nucleated RBCs 0.0 0 PER 100 WBC    nRBC 0.00 0.00 - 0.01 K/uL    Neutrophils % 54 32 - 75 %    Lymphocytes % 35 12 - 49 %    Monocytes % 7 5 - 13 %    Eosinophils % 3 0 - 7 %    Basophils % 1 0 - 1 %    Immature Granulocytes 0 0.0 - 0.5 %    Neutrophils Absolute 1.8 1.8 - 8.0 K/UL  Lymphocytes Absolute 1.2 0.8 - 3.5 K/UL    Monocytes Absolute 0.2 0.0 - 1.0 K/UL    Eosinophils Absolute 0.1 0.0 - 0.4 K/UL    Basophils Absolute 0.0 0.0 - 0.1 K/UL    Absolute Immature Granulocyte 0.0 0.00 - 0.04 K/UL    Differential Type AUTOMATED      RBC Comment ANISOCYTOSIS  1+       Comprehensive Metabolic Panel    Collection Time: 06/20/21  6:22 PM   Result Value Ref Range    Sodium 141 136 - 145 mmol/L    Potassium 4.1 3.5 - 5.1 mmol/L    Chloride 106 97 - 108 mmol/L    CO2 33 (H) 21 - 32 mmol/L    Anion Gap 2 (L) 5 - 15 mmol/L    Glucose 170 (H) 65 - 100 mg/dL    BUN 18 6 - 20 MG/DL    Creatinine 5.28 (H) 0.55 - 1.02 MG/DL    Bun/Cre Ratio 11 (L) 12 - 20      Est, Glom Filt Rate 34 (L) >60 ml/min/1.26m2    Calcium 8.6 8.5 - 10.1 MG/DL    Total Bilirubin 2.0 (H) 0.2 - 1.0 MG/DL    ALT 11 (L) 12 - 78 U/L    AST 12 (L) 15 - 37 U/L    Alk Phosphatase 71 45 - 117 U/L    Total Protein 6.6 6.4 - 8.2 g/dL    Albumin 3.3 (L) 3.5 - 5.0 g/dL    Globulin 3.3 2.0 - 4.0 g/dL    Albumin/Globulin Ratio 1.0 (L) 1.1 - 2.2     Troponin    Collection Time: 06/20/21  6:22 PM   Result Value Ref Range    Troponin, High Sensitivity 15 0 - 51 ng/L   Brain Natriuretic Peptide    Collection Time: 06/20/21  6:22 PM   Result Value Ref Range    NT Pro-BNP 3,147 (H) <125 PG/ML       Given the patient's current clinical presentation, I have a high level of concern for decompensation if discharged from the emergency department.  Complex decision making was performed, which includes reviewing  the patient's available past medical records, laboratory results, and x-ray films.         ASSESSMENT and PLAN:     Active Problems:    * No active hospital problems. *  Resolved Problems:    * No resolved hospital problems. *      Drishti Sybesma is a 66 y.o.  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  In the setting of volume overload    Acute hypoxic respiratory failure  Volume overload  Suspect acute diastolic CHF exacerbation  -Admit patient to the hospital service.    -IV Bumex 1 mg twice daily  -Follow respiratory status, if no improvement in 24 hours, consider pulmonology consultation    Morbid obesity  -Would benefit from sleep apnea screening as an outpatient with a sleep study.  Refer to pulmonology.  He    AKI on CKD  -Baseline creatinine 1.07  -Creatinine increased to 1.69 and is now trending down  -Follow BMP    DVT ppx: Lovenox    Dispo: Home in 3 to 5 days    FULL CODE        Risk of deterioration: high         Total time spent with patient care: 40 min.  I personally saw and examined the patient during this time period.                                                                                                                 Care Plan discussed with: Patient, nursing,    Discussed:  Care Plan       I have personally reviewed the radiographs, laboratory data in Epic and decisions and statements above are based partially on this personal interpretation.    siggned: Iris Pert, MD

## 2021-06-21 DIAGNOSIS — R601 Generalized edema: Secondary | ICD-10-CM

## 2021-06-21 MED ORDER — GABAPENTIN 100 MG PO CAPS
100 MG | Freq: Three times a day (TID) | ORAL | Status: DC
Start: 2021-06-21 — End: 2021-06-28
  Administered 2021-06-25 – 2021-06-28 (×3): 100 mg via ORAL

## 2021-06-21 MED ORDER — ACETAMINOPHEN 325 MG PO TABS
325 | Freq: Four times a day (QID) | ORAL | Status: DC | PRN
Start: 2021-06-21 — End: 2021-06-28

## 2021-06-21 MED ORDER — ONDANSETRON HCL 4 MG/2ML IJ SOLN
4 MG/2ML | Freq: Four times a day (QID) | INTRAMUSCULAR | Status: AC | PRN
Start: 2021-06-21 — End: 2021-06-28

## 2021-06-21 MED ORDER — POLYETHYLENE GLYCOL 3350 17 G PO PACK
17 g | Freq: Every day | ORAL | Status: AC | PRN
Start: 2021-06-21 — End: 2021-06-28

## 2021-06-21 MED ORDER — POTASSIUM CHLORIDE ER 10 MEQ PO TBCR
10 MEQ | Freq: Every day | ORAL | Status: DC
Start: 2021-06-21 — End: 2021-06-22
  Administered 2021-06-21 – 2021-06-22 (×2): 40 meq via ORAL

## 2021-06-21 MED ORDER — DEXTROSE 10 % IV BOLUS
INTRAVENOUS | Status: AC | PRN
Start: 2021-06-21 — End: 2021-06-28

## 2021-06-21 MED ORDER — SUCRALFATE 1 G PO TABS
1 GM | Freq: Three times a day (TID) | ORAL | Status: AC
Start: 2021-06-21 — End: 2021-06-28
  Administered 2021-06-21 – 2021-06-28 (×22): 1 g via ORAL

## 2021-06-21 MED ORDER — DEXTROSE 10 % IV SOLN
10 % | INTRAVENOUS | Status: AC | PRN
Start: 2021-06-21 — End: 2021-06-28

## 2021-06-21 MED ORDER — GLUCAGON (RDNA) 1 MG IJ KIT
1 MG | INTRAMUSCULAR | Status: AC | PRN
Start: 2021-06-21 — End: 2021-06-28

## 2021-06-21 MED ORDER — ACETAMINOPHEN 650 MG RE SUPP
650 | Freq: Four times a day (QID) | RECTAL | Status: DC | PRN
Start: 2021-06-21 — End: 2021-06-28

## 2021-06-21 MED ORDER — INSULIN LISPRO 100 UNIT/ML IJ SOLN
100 UNIT/ML | Freq: Three times a day (TID) | INTRAMUSCULAR | Status: AC
Start: 2021-06-21 — End: 2021-06-28
  Administered 2021-06-28: 17:00:00 2 [IU] via SUBCUTANEOUS

## 2021-06-21 MED ORDER — BUDESONIDE 0.5 MG/2ML IN SUSP
0.5 MG/2ML | Freq: Two times a day (BID) | RESPIRATORY_TRACT | Status: AC
Start: 2021-06-21 — End: 2021-06-28
  Administered 2021-06-21 – 2021-06-28 (×15): 500 ug via RESPIRATORY_TRACT

## 2021-06-21 MED ORDER — ONDANSETRON 4 MG PO TBDP
4 MG | Freq: Three times a day (TID) | ORAL | Status: DC | PRN
Start: 2021-06-21 — End: 2021-06-28

## 2021-06-21 MED ORDER — SODIUM CHLORIDE 0.9 % IV SOLN
0.9 % | INTRAVENOUS | Status: AC | PRN
Start: 2021-06-21 — End: 2021-06-28

## 2021-06-21 MED ORDER — ATORVASTATIN CALCIUM 10 MG PO TABS
10 MG | Freq: Every day | ORAL | Status: AC
Start: 2021-06-21 — End: 2021-06-28
  Administered 2021-06-21 – 2021-06-28 (×8): 10 mg via ORAL

## 2021-06-21 MED ORDER — AMIODARONE HCL 200 MG PO TABS
200 MG | Freq: Every day | ORAL | Status: AC
Start: 2021-06-21 — End: 2021-06-28
  Administered 2021-06-21 – 2021-06-28 (×7): 200 mg via ORAL

## 2021-06-21 MED ORDER — ALBUMIN HUMAN 25 % IV SOLN
25 % | Freq: Once | INTRAVENOUS | Status: DC
Start: 2021-06-21 — End: 2021-06-21

## 2021-06-21 MED ORDER — ASPIRIN 81 MG PO CHEW
81 MG | Freq: Every day | ORAL | Status: AC
Start: 2021-06-21 — End: 2021-06-28
  Administered 2021-06-21 – 2021-06-28 (×8): 81 mg via ORAL

## 2021-06-21 MED ORDER — POLYETHYLENE GLYCOL 3350 17 G PO PACK
17 g | Freq: Every day | ORAL | Status: AC
Start: 2021-06-21 — End: 2021-06-28
  Administered 2021-06-24 – 2021-06-28 (×3): 17 g via ORAL

## 2021-06-21 MED ORDER — IPRATROPIUM-ALBUTEROL 0.5-2.5 (3) MG/3ML IN SOLN
0.5-2.5 (3) MG/3ML | Freq: Four times a day (QID) | RESPIRATORY_TRACT | Status: DC
Start: 2021-06-21 — End: 2021-06-28
  Administered 2021-06-22: 1 via RESPIRATORY_TRACT
  Administered 2021-06-22: 18:00:00 3 via RESPIRATORY_TRACT
  Administered 2021-06-22 – 2021-06-28 (×18): 1 via RESPIRATORY_TRACT

## 2021-06-21 MED ORDER — NORMAL SALINE FLUSH 0.9 % IV SOLN
0.9 % | Freq: Two times a day (BID) | INTRAVENOUS | Status: AC
Start: 2021-06-21 — End: 2021-06-28
  Administered 2021-06-21 – 2021-06-28 (×14): 10 mL via INTRAVENOUS

## 2021-06-21 MED ORDER — OXYCODONE HCL 5 MG PO TABS
5 MG | Freq: Four times a day (QID) | ORAL | Status: DC | PRN
Start: 2021-06-21 — End: 2021-06-28
  Administered 2021-06-22 – 2021-06-23 (×2): 5 mg via ORAL

## 2021-06-21 MED ORDER — FLUTICASONE PROPIONATE 50 MCG/ACT NA SUSP
50 MCG/ACT | Freq: Every day | NASAL | Status: AC | PRN
Start: 2021-06-21 — End: 2021-06-28

## 2021-06-21 MED ORDER — CITALOPRAM HYDROBROMIDE 20 MG PO TABS
20 MG | Freq: Every day | ORAL | Status: AC
Start: 2021-06-21 — End: 2021-06-28
  Administered 2021-06-21 – 2021-06-28 (×8): 40 mg via ORAL

## 2021-06-21 MED ORDER — ALBUMIN HUMAN 25 % IV SOLN
25 % | Freq: Once | INTRAVENOUS | Status: AC
Start: 2021-06-21 — End: 2021-06-21
  Administered 2021-06-21: 15:00:00 12.5 g via INTRAVENOUS

## 2021-06-21 MED ORDER — TRAZODONE HCL 100 MG PO TABS
100 MG | Freq: Every evening | ORAL | Status: AC
Start: 2021-06-21 — End: 2021-06-28
  Administered 2021-06-22 – 2021-06-28 (×7): 100 mg via ORAL

## 2021-06-21 MED ORDER — LEVOTHYROXINE SODIUM 150 MCG PO TABS
150 MCG | Freq: Every day | ORAL | Status: AC
Start: 2021-06-21 — End: 2021-06-28
  Administered 2021-06-21 – 2021-06-28 (×8): 150 ug via ORAL

## 2021-06-21 MED ORDER — METOPROLOL SUCCINATE ER 25 MG PO TB24
25 MG | Freq: Every day | ORAL | Status: DC
Start: 2021-06-21 — End: 2021-06-22
  Administered 2021-06-21: 15:00:00 25 mg via ORAL

## 2021-06-21 MED ORDER — INSULIN LISPRO 100 UNIT/ML IJ SOLN
100 UNIT/ML | Freq: Every evening | INTRAMUSCULAR | Status: AC
Start: 2021-06-21 — End: 2021-06-28

## 2021-06-21 MED ORDER — ENOXAPARIN SODIUM 40 MG/0.4ML IJ SOSY
40 MG/0.4ML | Freq: Two times a day (BID) | INTRAMUSCULAR | Status: AC
Start: 2021-06-21 — End: 2021-06-28
  Administered 2021-06-21 – 2021-06-28 (×15): 40 mg via SUBCUTANEOUS

## 2021-06-21 MED ORDER — IPRATROPIUM-ALBUTEROL 0.5-2.5 (3) MG/3ML IN SOLN
0.5-2.5 (3) MG/3ML | RESPIRATORY_TRACT | Status: DC
Start: 2021-06-21 — End: 2021-06-21
  Administered 2021-06-21: 17:00:00 1 via RESPIRATORY_TRACT

## 2021-06-21 MED ORDER — NORMAL SALINE FLUSH 0.9 % IV SOLN
0.9 % | INTRAVENOUS | Status: AC | PRN
Start: 2021-06-21 — End: 2021-06-28

## 2021-06-21 MED ORDER — BUMETANIDE 0.25 MG/ML IJ SOLN
0.25 | Freq: Two times a day (BID) | INTRAMUSCULAR | Status: DC
Start: 2021-06-21 — End: 2021-06-28
  Administered 2021-06-21 – 2021-06-26 (×6): 1 mg via INTRAVENOUS

## 2021-06-21 MED ORDER — ALBUMIN HUMAN-KJDA 25 % IV SOLN
25 % | Freq: Once | INTRAVENOUS | Status: DC
Start: 2021-06-21 — End: 2021-06-21

## 2021-06-21 MED ORDER — GLUCOSE 4 G PO CHEW
4 g | ORAL | Status: AC | PRN
Start: 2021-06-21 — End: 2021-06-28

## 2021-06-21 MED FILL — BD POSIFLUSH 0.9 % IV SOLN: 0.9 % | INTRAVENOUS | Qty: 40

## 2021-06-21 MED FILL — LEVOTHYROXINE SODIUM 150 MCG PO TABS: 150 MCG | ORAL | Qty: 1

## 2021-06-21 MED FILL — IPRATROPIUM-ALBUTEROL 0.5-2.5 (3) MG/3ML IN SOLN: RESPIRATORY_TRACT | Qty: 3

## 2021-06-21 MED FILL — FLUTICASONE PROPIONATE 50 MCG/ACT NA SUSP: 50 MCG/ACT | NASAL | Qty: 16

## 2021-06-21 MED FILL — POLYETHYLENE GLYCOL 3350 17 G PO PACK: 17 g | ORAL | Qty: 1

## 2021-06-21 MED FILL — ENOXAPARIN SODIUM 40 MG/0.4ML IJ SOSY: 40 MG/0.4ML | INTRAMUSCULAR | Qty: 0.4

## 2021-06-21 MED FILL — CITALOPRAM HYDROBROMIDE 20 MG PO TABS: 20 MG | ORAL | Qty: 2

## 2021-06-21 MED FILL — POTASSIUM CHLORIDE ER 10 MEQ PO TBCR: 10 MEQ | ORAL | Qty: 4

## 2021-06-21 MED FILL — DEXTROSE 10 % IV SOLN: 10 % | INTRAVENOUS | Qty: 500

## 2021-06-21 MED FILL — ALBUMINEX 25 % IV SOLN: 25 % | INTRAVENOUS | Qty: 50

## 2021-06-21 MED FILL — ATORVASTATIN CALCIUM 10 MG PO TABS: 10 MG | ORAL | Qty: 1

## 2021-06-21 MED FILL — METOPROLOL SUCCINATE ER 25 MG PO TB24: 25 MG | ORAL | Qty: 1

## 2021-06-21 MED FILL — AMIODARONE HCL 200 MG PO TABS: 200 MG | ORAL | Qty: 1

## 2021-06-21 MED FILL — ASPIRIN 81 MG PO CHEW: 81 MG | ORAL | Qty: 1

## 2021-06-21 MED FILL — CARAFATE 1 G PO TABS: 1 g | ORAL | Qty: 1

## 2021-06-21 MED FILL — BUDESONIDE 0.5 MG/2ML IN SUSP: 0.5 MG/2ML | RESPIRATORY_TRACT | Qty: 2

## 2021-06-21 MED FILL — GABAPENTIN 100 MG PO CAPS: 100 MG | ORAL | Qty: 1

## 2021-06-21 MED FILL — BUMETANIDE 0.25 MG/ML IJ SOLN: 0.25 MG/ML | INTRAMUSCULAR | Qty: 10

## 2021-06-21 MED FILL — ALBUMIN HUMAN 25 % IV SOLN: 25 % | INTRAVENOUS | Qty: 50

## 2021-06-21 NOTE — Other (Addendum)
Chart reviewed by Heart Failure Nurse Navigator.  Heart Failure database completed.     Patient came to ED for increasing dyspnea and is admitted with acute on chronic HFpEF.  She has recently had hypotension and required reduction in bumex dose by her Cardiologist.  She had bumex increased back by Cardiologist for increased LE edema and dyspnea without improvement.  She has PMH of HFpEF, OSA, CKD, DM, aortic valve replacement, GERD, COPD, anxiety, depression, and morbid obesity.      She has had multiple hospitalizations recently along with stays in rehab.  Last hospitalization was 05/09/21 to 05/29/21 with acute on chronic HFpEF, refractory hypotension, ESBL UTI with recent group B strp bacteremia, and anemia requiring IV iron and Hematology evaluation.  Patient was discharged on florinef and midodrine.    EF:  Recent Echo 05/22/21 with EF of 60 to 65% LV size is normal, mildly increased wall thickness, bioprosthetic aortic valve with possible mild stenosis       ACEi/ARB/ARNi: **    BB: Unable to tolerate last admission due to hypotension    Aldosterone Antagonist: **    Obstructive Sleep Apnea Screening:   Referred to Sleep Medicine:     CRT **.     NYHA Functional Class **.      Heart Failure Teach Back in Patient Education.    Heart Failure Avoiding Triggers on Discharge Instructions.      Cardiologist: Dr Raelene Bott    Post discharge follow up phone call to be made within 48-72 hours of discharge.     Met with patient at bedside.  Patient was familiar to me from previous admission.  Patient states she saw  her PCP after last admission but it was not Dola Factor because she was on vacation.  She was in touch with Dr Raelene Bott through his nurse initially for hypotension and again for increased edema and shortness of breath.  She has an appointment with Cardiology next week but has not seen them since discharge.  Patient states she was taking all of the new medications including florinef and midodrine from last discharge.   Discrepancies noted on medication reconciliation done on admission.  Patient gave me the list she has been giving [place] of her current medications and she realized the list is outdated.  Reviewed medication list from AVS at last discharge with patient and confirmed her current medications with her.   Discussed with her primary nurse, Caitlyn, who will complete updated medication reconciliation.  Notified Dr Valetta Mole by [employer] message.    Patient states her scale at home broke about a week after discharge.  She replaced the batteries without success and will need a new scale.  She knew she was gaining weight and reached out to Dr Raelene Bott.   She states her last weight was 326 lbs and she was 341 lbs by standing scale on admission.  She states she was very healthy with her diet choices related to both salt and sugar intake.  Her blood sugars were well controlled at home.  She had all of her medications and was taking them.  She has followed up on her CPAP machine as instructed by Pulmonology and a new machine has been ordered.  She does not have a nebulize machine at home thus she was not taking the duoneb medication ordered at last discharge.  Patient did have [place] with weekly visits from nursing and PT.

## 2021-06-21 NOTE — Progress Notes (Signed)
Hospitalist Progress Note      NAME:  Deborah [name]   DOB:  December 28, 1955  MRM:  [id number]    Date/Time: 06/21/2021  6:56 PM           Assessment / Plan:     72F hx COPD, HFpEF p/w sob, weight gain, leg swelling, hypoxia    #AHRF: Due to HFpEF as well as COPD and MOHS.    - Diurese nightly CPAP    #Acute exacerbation of HFpEF: HX AV replacement. She is at least 15 lb up from her baseline and has anasarca. Bumex had been decreased as outpt prior to this d/t hypotension. Presently on 3L   - IV diuresis, add albumin    - I suspect she will need several days of diuresis   - Strict I/O   - Echo 1 month ago, no need for repeat   - May consider abd u/s to eval cirrhosis    #AKI: Cardiorenal in s/o above   - Diurese   - Renally dose meds, avoid nephrotoxins    #Hx PVCs / PPM:    - Cont amio, metop  #COPD: Not in exacerbation   - Con thome nebs    #Hypothyroidism: Lt4    #HTN: metop    #DM? Bgs fairly high, but last A1c 5.7.    - SSI    #Morbid Obesity: BMI 54; c/b MOHS. Rec weight loss    I have personally reviewed the radiographs, laboratory data in Epic and decisions and statements above are based partially on this personal interpretation.                 Care Plan discussed with: Patient, Care Manager, Nursing Staff, and Consultant/Specialist    Discussed:  Care Plan    Prophylaxis:  Lovenox    Disposition:  Home w/Family           ___________________________________________________    Attending Physician: Glenetta Hew, MD        Subjective:     Chief Complaint:  No acute events overnight. She says she has not yet received diuretic this morning so feels the same as yesterday. Says this all started when her diuretic was reduced    ROS:  (bold if positive, if negative)    Tolerating PT  Tolerating Diet          Objective:       Vitals:          Last 24hrs VS reviewed since prior progress note. Most recent are:    Vitals:    06/21/21 1730   BP: 102/66   Pulse:    Resp:    Temp:    SpO2:      SpO2 Readings from Last 6  Encounters:   06/21/21 98%   06/16/21 98%          Intake/Output Summary (Last 24 hours) at 06/21/2021 1856  Last data filed at 06/21/2021 1352  Gross per 24 hour   Intake --   Output 1550 ml   Net -1550 ml          Exam:     Physical Exam:    Gen:  Well-developed, well-nourished, in no acute distress; morbidly obese  HEENT:  Pink conjunctivae, PERRL, hearing intact to voice, moist mucous membranes  Neck:  Supple, without masses, thyroid non-tender  Resp:  No accessory muscle use, clear breath sounds without wheezes rales or rhonchi  Card:  No murmurs, normal S1, S2 without thrills,  bruits; anasarca pitting abdomen  Abd:  Soft, non-tender, non-distended, normoactive bowel sounds are present  Musc:  No cyanosis or clubbing  Skin:  No rashes or ulcers, skin turgor is good  Neuro:  Cranial nerves 3-12 are grossly intact, grip strength is 5/5 bilaterally and dorsi / plantarflexion is 5/5 bilaterally, follows commands appropriately  Psych:  Good insight, oriented to person, place and time, alert       Medications Reviewed: (see below)    Lab Data Reviewed: (see below)    ______________________________________________________________________    Medications:     Current Facility-Administered Medications   Medication Dose Route Frequency    gabapentin (NEURONTIN) capsule 100 mg  100 mg Oral TID    oxyCODONE (ROXICODONE) immediate release tablet 5 mg  5 mg Oral Q6H PRN    sodium chloride flush 0.9 % injection 5-40 mL  5-40 mL IntraVENous 2 times per day    sodium chloride flush 0.9 % injection 5-40 mL  5-40 mL IntraVENous PRN    0.9 % sodium chloride infusion   IntraVENous PRN    enoxaparin (LOVENOX) injection 40 mg  40 mg SubCUTAneous BID    ondansetron (ZOFRAN-ODT) disintegrating tablet 4 mg  4 mg Oral Q8H PRN    Or    ondansetron (ZOFRAN) injection 4 mg  4 mg IntraVENous Q6H PRN    polyethylene glycol (GLYCOLAX) packet 17 g  17 g Oral Daily PRN    acetaminophen (TYLENOL) tablet 650 mg  650 mg Oral Q6H PRN    Or     acetaminophen (TYLENOL) suppository 650 mg  650 mg Rectal Q6H PRN    amiodarone (CORDARONE) tablet 200 mg  200 mg Oral Daily    aspirin chewable tablet 81 mg  81 mg Oral Daily    atorvastatin (LIPITOR) tablet 10 mg  10 mg Oral Daily    budesonide (PULMICORT) nebulizer suspension 500 mcg  500 mcg Nebulization BID    bumetanide (BUMEX) injection 1 mg  1 mg IntraVENous BID    citalopram (CELEXA) tablet 40 mg  40 mg Oral Daily    fluticasone (FLONASE) 50 MCG/ACT nasal spray 2 spray  2 spray Nasal Daily PRN    levothyroxine (SYNTHROID) tablet 150 mcg  150 mcg Oral QAM AC    metoprolol succinate (TOPROL XL) extended release tablet 25 mg  25 mg Oral Daily    polyethylene glycol (GLYCOLAX) packet 17 g  17 g Oral Daily    potassium chloride (KLOR-CON) extended release tablet 40 mEq  40 mEq Oral Daily    sucralfate (CARAFATE) tablet 1 g  1 g Oral TID    traZODone (DESYREL) tablet 100 mg  100 mg Oral Nightly    ipratropium 0.5 mg-albuterol 2.5 mg (DUONEB) nebulizer solution 1 ampule  1 ampule Inhalation Q6H WA            Lab Review:     Recent Labs     06/20/21  1822   WBC 3.3*   HGB 9.6*   HCT 32.4*   PLT 86*     Recent Labs     06/20/21  1822   NA 141   K 4.1   CL 106   CO2 33*   BUN 18   ALT 11*     No components found for: [facility]

## 2021-06-21 NOTE — Plan of Care (Signed)
Problem: Discharge Planning  Goal: Discharge to home or other facility with appropriate resources  Outcome: Progressing     Problem: Pain  Goal: Verbalizes/displays adequate comfort level or baseline comfort level  Outcome: Progressing     Problem: Skin/Tissue Integrity  Goal: Absence of new skin breakdown  Description: 1.  Monitor for areas of redness and/or skin breakdown  2.  Assess vascular access sites hourly  3.  Every 4-6 hours minimum:  Change oxygen saturation probe site  4.  Every 4-6 hours:  If on nasal continuous positive airway pressure, respiratory therapy assess nares and determine need for appliance change or resting period.  Outcome: Progressing     Problem: Safety - Adult  Goal: Free from fall injury  Outcome: Progressing

## 2021-06-21 NOTE — Discharge Instructions (Addendum)
HOSPITALIST DISCHARGE INSTRUCTIONS          NAME: Deborah [name]   DOB:  01-13-1956   MRN:  [id number]     Date/Time:  06/28/2021 10:41 AM    ADMIT DATE: 06/20/2021     DISCHARGE DATE: 06/28/2021     ADMITTING DIAGNOSIS:    Acute on chronic diastolic heart failure    DISCHARGE DIAGNOSIS:  same    MEDICATIONS:  See after visit summary       It is important that you take the medication exactly as they are prescribed.   Keep your medication in the bottles provided by the pharmacist and keep a list of the medication names, dosages, and times to be taken in your wallet.   Deborah [name] not take other medications without consulting your doctor     Pain Management: per above medications    What to Deborah [name] at Home    Recommended diet:  cardiac diet    Recommended activity: activity as tolerated    1) Return to the hospital if you feel worse    2) If you experience any of the following symptoms then please call your primary care physician or return to the emergency room if you cannot get hold of your doctor:  Fever, chills, nausea, vomiting, diarrhea, change in mentation, falling, bleeding, shortness of breath, chest pain, severe headache, severe abdominal pain,     3) Follow up with your doctors as directed    4) Follow a low salt (2gm) diet    5) Avoid fluid intake of more than 1.5L per day (this is total fluid intake, including all drinks and food)    6) Take daily weight and report to your cardiologist     Follow Up:  [facility]  [phone]  Follow up  Home health    Deborah Matte, APRN - NP  [address] [place]  [address]  [place] [address]  [phone]    Follow up on 07/06/2021  10:20 am    Deborah Nones, APRN - NP  [address]  [place] [address]  [phone]    Follow up on 07/02/2021  Hospital Follow-up appointment at 11:00 am . Please bring all discharge paperwork with you to appointment . Please all have Hospital Fax all medical records to Deborah [name] at [phone] at  discharge.  .      Information obtained by :  I understand that if any problems occur once I am at home I am to contact my physician.    I understand and acknowledge receipt of the instructions indicated above.                                                                                                                                           Physician's or R.N.'s Signature  Date/Time                                                                                                                                              Patient or Representative Signature                                                          Date/Time        Heart Failure: Care Instructions  Overview     Heart failure occurs when your heart does not pump as much blood as the body needs. Failure does not mean that the heart has stopped pumping but rather that it is not pumping as well as it should. Over time, this causes fluid buildup in your lungs and other parts of your body. Fluid buildup can cause shortness of breath, fatigue, swollen ankles, and other problems. Heart failure is treated with medicines, a heart-healthy lifestyle, and the steps you take to check your symptoms. Treatment can slow the disease, help you feel better, and help keep you out of the hospital. Treatment may also help you live longer.  Follow-up care is a key part of your treatment and safety. Be sure to make and go to all appointments, and call your doctor if you are having problems. It's also a good idea to know your test results and keep a list of the medicines you take.  How can you care for yourself at home?  Medicines    Be safe with medicines. Take your medicines exactly as prescribed. Call your doctor if you think you are having a problem with your medicine.     You will get more details on the specific medicines your doctor prescribes. Medicines can help your heart work better, help you feel  better, and help keep you out of the hospital. Medicines may also help you live longer.     Talk with your doctor or pharmacist before you take a new prescription or over-the-counter medicine. Ask if the medicine is safe for you to take. Some medicines can affect your heart and make heart failure worse. Others may keep your heart failure medicines from working right. Over-the-counter medicines that you may need to avoid include herbal supplements, vitamins, pain relievers called NSAIDs, antacids, laxatives, and cough, cold, flu, or sinus medicine.   Diet    Eat heart-healthy foods. These foods include vegetables, fruits, nuts, beans, lean meat, fish, and whole grains.     Your doctor may suggest that you limit sodium. Your doctor can tell you how much sodium is right for you. An example is less than 3,000 mg a day. This includes all the salt you eat in cooking or in  packaged foods. People get most of their sodium from processed foods. Fast food and restaurant meals also tend to be very high in sodium.     Limit your fluid intake if your doctor tells you to. Your doctor will tell you how much fluid you can have in a day.   Symptoms    Weigh yourself without clothing at the same time each day. Record your weight. Call your doctor if you have a sudden weight gain, such as more than 2 to 3 pounds in a day or 5 pounds in a week. (Your doctor may suggest a different range of weight gain.) A sudden weight gain may mean that your heart failure is getting worse.     Check your symptoms every day to watch for changes. Know what to Deborah [name] if your symptoms get worse.   Activity    Be active. Deborah [name] not start to exercise until you have talked with your doctor. Together you can make an exercise program that is enjoyable and safe for you. Regular exercise can make your heart and your body stronger. Being active can help you feel better too.     With your doctor, plan how often, how long, and how hard you will be active. Don't exercise too  hard because it can put stress on your heart.     If your doctor has not set you up with a cardiac rehabilitation (rehab) program, ask if it's right for you. Cardiac rehab can give you education and support that help you stay as healthy as possible.     When you exercise, watch for signs that your heart is working too hard. You are pushing yourself too hard if you cannot talk while you are exercising. If you become short of breath or dizzy or have chest pain, stop, sit down, and rest.   Heart-healthy lifestyle    Darria Corvera not smoke. Smoking can make a heart condition worse. If you need help quitting, talk to your doctor about stop-smoking programs and medicines. These can increase your chances of quitting for good. Quitting smoking may be the most important step you can take to protect your heart.     Stay at a healthy weight. Lose weight if you need to.     Manage other health problems such as diabetes and high blood pressure.     Limit or avoid alcohol. Ask your doctor how much alcohol, if any, is safe for you.     If you think you may have a problem with alcohol or drug use, talk to your doctor.     Avoid infections such as COVID-19, colds, and the flu. Get the flu vaccine every year. Get a pneumococcal vaccine shot. If you have had one before, ask your doctor whether you need another dose. Stay up to date on your COVID-19 vaccines.   When should you call for help?   Call 911  if you have symptoms of sudden heart failure such as:    You have severe trouble breathing.     You cough up pink, foamy mucus.     You have a new irregular or rapid heartbeat.   Call your doctor now or seek immediate medical care if:    You have new or increased shortness of breath.     You are dizzy or lightheaded, or you feel like you may faint.     You have sudden weight gain, such as more than 2 to 3 pounds in a day or  5 pounds in a week. (Your doctor may suggest a different range of weight gain.)     You have increased swelling in your  legs, ankles, or feet.     You are suddenly so tired or weak that you cannot Taris Galindo your usual activities.   Watch closely for changes in your health, and be sure to contact your doctor if you develop new symptoms.  Where can you learn more?  Go to [employer] and enter E597 to learn more about "Heart Failure: Care Instructions."  Current as of: October 04, 2020               Content Version: 13.6   2006-2023 [employer].   Care instructions adapted under license by [facility]. If you have questions about a medical condition or this instruction, always ask your healthcare professional. [employer] disclaims any warranty or liability for your use of this information.        My Heart Failure Action Plan   If you notice changes in your heart failure symptoms, follow your heart failure action plan.  Acting quickly will help you to feel better and stay out of the hospital.      Danger Zone - Seek Help:  My symptoms:   Sudden, severe shortness of breath   Develop new chest pain/tightness/ heaviness   Sweating, weakness or fainting  What to Ivor Kishi:  Call 911 NOW  ______________________________________________________________________    Caution Zone - I Briyana Badman not feel well:  Weight up by 3 lb in one day or 5 lb in a week   Swelling in ankles, legs or abdomen   Hard to breath when active or at night   Need to use more pillows at night   Constant cough or wheeze   Very tired  Weight down by 3 lb   Dry mouth/skin   Dizziness, Low blood pressure   What to Vasilia Dise:  Call your Primary Care Physician for guidance  Lance Sell Barrett  {No department listed for PCP}   Call your Cardiologist for guidance  Call [facility] at [phone]  ________________________________________________________________  Clear Zone - I feel well:   My symptoms:   Weight on target range, no weight gain over 2 lb   Little or no swelling   Breathing is easy, no shortness of breath   What to Kahil Agner:  Keep taking my  pills  Keep doing my daily checks -  weight, swelling and breathing   Keep eating a healthy, low salt diet  Keep making changes to improve my health  Attend all follow up appointments as scheduled

## 2021-06-21 NOTE — ED Notes (Signed)
Report given to [place] with [facility], MAR.     Ward Givens, RN  06/21/21 [phone]

## 2021-06-21 NOTE — Progress Notes (Addendum)
2115: Pt's BP 104/42, MAP 63. Pt has scheduled bumex 1 mg IV due at 2100. Messaged Bronson Ing NP to see if she wanted RN to give med or hold it. Advised to hold at this time, will reassess BP and see if med can be given at a later time.   2336: BP 93/65, MAP 74. NP informed, advised to continue to hold bumex.

## 2021-06-21 NOTE — Progress Notes (Addendum)
0220: Per report, patient has not been seen by admitting MD yet, per the ED nurse MD is aware and will come to floor to see pt. Pt arrived to floor and settled in bed, informed that MD will come to see her as she does not have orders in yet. This RN sent Dr Obie Dredge a message at [phone] reminding him of this pt. MD came to see pt but has not put in admission orders at time of this note (8527) despite this RN sending several perfect serve messages.

## 2021-06-21 NOTE — Care Coordination-Inpatient (Signed)
06/21/2021 12:33 PM Met with pt. Charted address and phone numbers confirmed.     Reason for Readmission:     Dehydration [E86.0]  Anasarca [R60.1]  Hypotension, unspecified hypotension type [I95.9]  Acute on chronic heart failure, unspecified heart failure type ([place]) [I50.9]  Acute on chronic heart failure with preserved ejection fraction (HFpEF) ([place]) [I50.33]    Pt was recently at [facility] from 4/12-05/29/2021 for acute on chronic HF. Pt was discharged home with home health through [facility], home O2 through [place] and family support.    Past Medical History:   Diagnosis Date    (HFpEF) heart failure with preserved ejection fraction (HCC)     Anxiety and depression     Aortic valve replaced     S/p bovine aortic valve replacement.    Asthma     Chronic narcotic use     Chronic obstructive pulmonary disease (HCC)     Chronic pain     CKD (chronic kidney disease), stage III (HCC)     Baseline creatinine is 1.3-1.4 with GFR in the 40s.    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)     GERD (gastroesophageal reflux disease)     History of vascular access device 04/13/2021    4 FR Single PICC for LTABX: R cephalic vessell length 48 CM Max P leave @ 1 CM out; Arm circumferenc 40 CM    Hyperlipidemia     Hypothyroidism     Morbid obesity ([place])     Neuropathy     Obstructive sleep apnea     Rhinitis               RUR Score/Risk Level:     18%/ HIGH    PCP: First and Last name:  Karilyn Cota, APRN - NP     Name of Practice:    Are you a current patient: Yes/No: Yes   Approximate date of last visit: Earlier this week   Can you participate in a virtual visit with your PCP: N/a    Is a Care Conference indicated:   No     Did you attend your follow up appointment (s):  If not, why not:  Pt saw her PCP and Cardiologist since last admission.        Resources/supports as identified by patient/family:   Son and daughter in [place], multimedia facing patient (as identified by patient/family and CM):          Finances/Medication cost? No concerns, [facility] [place]  No concerns, family transports pt to appts and will drive her home      Support system or lack thereof?  No concerns, supportive family   Living arrangements? No concerns, lives with son and daughter in law in [place]. Pt reported she is only home alone for about 4 hours during the day.            Self-care/ADLs/Cognition?   No concerns, independent with adls. Pt uses a cane primarily for ambulation. Pt does own a rw, tub transfer bed and wheelchair at home.         Current Advanced Directive/Advance Care Plan:  [place] for utilizing home health: Yes, pt was open to [facility] prior to admission. CM confirmed with pt she would like their services to resume at discharge, choice letter signed.  CM spoke with Mardene Celeste with [facility] who reported they can accept pt back and they were providing home health PT and RN. Resumption order sent to [facility] via [place].             Transition of Care Plan:    Based on readmission, the patient's previous Plan of Care   has been evaluated and/or modified. The current Transition of Care Plan is:  Return home with home health through [facility] and family support.      Evonnie Dawes, BSW      06/21/21 1223   Service Assessment   Patient Orientation Alert and Oriented   Cognition Alert   History Provided By Patient   Primary [place]   Patient's Healthcare Decision Maker is: Legal Next of Kin   PCP Verified by CM Yes   Last Visit to PCP Within last 3 months   Prior Functional Level Independent in ADLs/IADLs   Can patient return to prior living arrangement Yes   Ability to make needs known: Good   Family able to assist with home care needs: Yes   Would you like for me to discuss the discharge plan with any other family members/significant others, and if so, who? 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 No   Mode of Transport at Discharge Other (see comment)  (Family)   Confirm Follow Up Transport Family   Condition of Participation: Discharge Planning   The Plan for Transition of Care is related to the following treatment goals: home health   The Patient and/or Patient Representative was provided with a Choice of Provider? Patient   The Patient and/Or Patient Representative agree with the Discharge Plan? Yes   Freedom of Choice list was provided with basic dialogue that supports the patient's individualized plan of care/goals, treatment preferences, and shares the quality data associated with the providers?  Yes  (Choice letter signed for [facility], pt was open to this agency prior to admission.)      06/21/21 1232   Readmission Assessment   Number of Days since last admission? 8-30 days   Previous Disposition Home with Home Health   Who is being Interviewed Patient   What was the  patient's/caregiver's perception as to why they think they needed to return back to the hospital? Other (Comment)  (Fluid was returning)   Did you  visit your Primary Care Physician after you left the hospital, before you returned this time? Yes   Did you see a specialist, such as Cardiac, Pulmonary, Orthopedic Physician, etc. after you left the hospital? Yes   Who advised the patient to return to the hospital? Self-referral   Does the patient report anything that got in the way of taking their medications? No   In our efforts to provide the best possible care to you and others like you, can you think of anything that we could have done to help you after you left the hospital the first time, so that you might not have needed to return so soon? Other (Comment)  (None)

## 2021-06-22 ENCOUNTER — Inpatient Hospital Stay: Admit: 2021-06-23 | Payer: MEDICARE | Primary: Family

## 2021-06-22 LAB — POCT GLUCOSE
POC Glucose: 140 mg/dL — ABNORMAL HIGH (ref 65–117)
POC Glucose: 129 mg/dL — ABNORMAL HIGH (ref 65–117)
POC Glucose: 166 mg/dL — ABNORMAL HIGH (ref 65–117)
POC Glucose: 176 mg/dL — ABNORMAL HIGH (ref 65–117)

## 2021-06-22 LAB — HEMOGLOBIN A1C
Hemoglobin A1C: 5.3 % (ref 4.0–5.6)
eAG: 105 mg/dL

## 2021-06-22 LAB — BASIC METABOLIC PANEL
Anion Gap: 2 mmol/L — ABNORMAL LOW (ref 5–15)
BUN: 19 MG/DL (ref 6–20)
Bun/Cre Ratio: 13 (ref 12–20)
CO2: 34 mmol/L — ABNORMAL HIGH (ref 21–32)
Calcium: 8.9 MG/DL (ref 8.5–10.1)
Chloride: 102 mmol/L (ref 97–108)
Creatinine: 1.5 MG/DL — ABNORMAL HIGH (ref 0.55–1.02)
Est, Glom Filt Rate: 39 mL/min/{1.73_m2} — ABNORMAL LOW (ref >60)
Glucose: 132 mg/dL — ABNORMAL HIGH (ref 65–100)
Potassium: 5 mmol/L (ref 3.5–5.1)
Sodium: 138 mmol/L (ref 136–145)

## 2021-06-22 LAB — EKG 12-LEAD
Atrial Rate: 82 {beats}/min
P Axis: 64 degrees
P-R Interval: 176 ms
Q-T Interval: 378 ms
QRS Duration: 158 ms
QTc Calculation (Bazett): 441 ms
R Axis: 226 degrees
T Axis: 33 degrees
Ventricular Rate: 82 {beats}/min

## 2021-06-22 LAB — ALBUMIN: Albumin: 3.2 g/dL — ABNORMAL LOW (ref 3.5–5.0)

## 2021-06-22 LAB — CK: Total CK: 17 U/L — ABNORMAL LOW (ref 26–192)

## 2021-06-22 MED ORDER — ALBUMIN HUMAN 25 % IV SOLN
25 % | Freq: Once | INTRAVENOUS | Status: DC
Start: 2021-06-22 — End: 2021-06-22

## 2021-06-22 MED ORDER — ALBUMIN HUMAN 25 % IV SOLN
25 % | Freq: Every day | INTRAVENOUS | Status: AC
Start: 2021-06-22 — End: 2021-06-26
  Administered 2021-06-22 – 2021-06-25 (×4): 12.5 g via INTRAVENOUS

## 2021-06-22 MED ORDER — MEXILETINE HCL 150 MG PO CAPS
150 MG | Freq: Three times a day (TID) | ORAL | Status: AC
Start: 2021-06-22 — End: 2021-06-28
  Administered 2021-06-22 – 2021-06-28 (×18): 150 mg via ORAL

## 2021-06-22 MED ORDER — MIDODRINE HCL 5 MG PO TABS
5 MG | Freq: Three times a day (TID) | ORAL | Status: AC
Start: 2021-06-22 — End: 2021-06-28
  Administered 2021-06-22 – 2021-06-28 (×18): 10 mg via ORAL

## 2021-06-22 MED ORDER — FLUDROCORTISONE ACETATE 0.1 MG PO TABS
0.1 MG | Freq: Every day | ORAL | Status: DC
Start: 2021-06-22 — End: 2021-06-22
  Administered 2021-06-22: 16:00:00 0.1 mg via ORAL

## 2021-06-22 MED ORDER — ALBUMIN HUMAN 25 % IV SOLN
25 % | Freq: Every day | INTRAVENOUS | Status: DC
Start: 2021-06-22 — End: 2021-06-22

## 2021-06-22 MED ORDER — ALBUMIN HUMAN-KJDA 25 % IV SOLN
25 % | Freq: Every day | INTRAVENOUS | Status: DC
Start: 2021-06-22 — End: 2021-06-22

## 2021-06-22 MED FILL — CITALOPRAM HYDROBROMIDE 20 MG PO TABS: 20 MG | ORAL | Qty: 2

## 2021-06-22 MED FILL — CARAFATE 1 G PO TABS: 1 g | ORAL | Qty: 1

## 2021-06-22 MED FILL — POTASSIUM CHLORIDE ER 10 MEQ PO TBCR: 10 MEQ | ORAL | Qty: 4

## 2021-06-22 MED FILL — MEXILETINE HCL 150 MG PO CAPS: 150 MG | ORAL | Qty: 1

## 2021-06-22 MED FILL — OXYCODONE HCL 5 MG PO TABS: 5 MG | ORAL | Qty: 1

## 2021-06-22 MED FILL — FLUDROCORTISONE ACETATE 0.1 MG PO TABS: 0.1 MG | ORAL | Qty: 1

## 2021-06-22 MED FILL — IPRATROPIUM-ALBUTEROL 0.5-2.5 (3) MG/3ML IN SOLN: RESPIRATORY_TRACT | Qty: 3

## 2021-06-22 MED FILL — ALBUMIN HUMAN 25 % IV SOLN: 25 % | INTRAVENOUS | Qty: 50

## 2021-06-22 MED FILL — BUMETANIDE 0.25 MG/ML IJ SOLN: 0.25 MG/ML | INTRAMUSCULAR | Qty: 10

## 2021-06-22 MED FILL — AMIODARONE HCL 200 MG PO TABS: 200 MG | ORAL | Qty: 1

## 2021-06-22 MED FILL — ALBUTEIN 25 % IV SOLN: 25 % | INTRAVENOUS | Qty: 50

## 2021-06-22 MED FILL — ALBUMINEX 25 % IV SOLN: 25 % | INTRAVENOUS | Qty: 50

## 2021-06-22 MED FILL — ATORVASTATIN CALCIUM 10 MG PO TABS: 10 MG | ORAL | Qty: 1

## 2021-06-22 MED FILL — BUDESONIDE 0.5 MG/2ML IN SUSP: 0.5 MG/2ML | RESPIRATORY_TRACT | Qty: 2

## 2021-06-22 MED FILL — ENOXAPARIN SODIUM 40 MG/0.4ML IJ SOSY: 40 MG/0.4ML | INTRAMUSCULAR | Qty: 0.4

## 2021-06-22 MED FILL — GABAPENTIN 100 MG PO CAPS: 100 MG | ORAL | Qty: 1

## 2021-06-22 MED FILL — POLYETHYLENE GLYCOL 3350 17 G PO PACK: 17 g | ORAL | Qty: 1

## 2021-06-22 MED FILL — MIDODRINE HCL 5 MG PO TABS: 5 MG | ORAL | Qty: 2

## 2021-06-22 MED FILL — METOPROLOL SUCCINATE ER 25 MG PO TB24: 25 MG | ORAL | Qty: 1

## 2021-06-22 MED FILL — LEVOTHYROXINE SODIUM 150 MCG PO TABS: 150 MCG | ORAL | Qty: 1

## 2021-06-22 MED FILL — ASPIRIN 81 MG PO CHEW: 81 MG | ORAL | Qty: 1

## 2021-06-22 MED FILL — TRAZODONE HCL 100 MG PO TABS: 100 MG | ORAL | Qty: 1

## 2021-06-22 NOTE — Consults (Signed)
See cardiology initial encounter

## 2021-06-22 NOTE — Progress Notes (Signed)
[facility]                    Cardiology Care Note     [x] Initial Encounter     [] Follow-up    Patient Name: Deborah [name] - DOB:29-Jul-1955 - Deborah [name]  Primary Cardiologist: 01/25/1957, MD  Consulting Cardiologist: BUL:[id number], DO     Reason for encounter: CHF    HPI:       Deborah [name] is a 66 y.o. female with PMH significant for HFpEF, Afib, CKD, s/p AVR, COPD, OSA, obesity, and DM. Patient states she had DOE and a near syncope episode during ambulation this past week after increasing her Bumex to twice daily. Her legs have been swelling significantly during the past two weeks without improvement after increasing Bumex.. She denies chest pain or palpitations. Patient discharged for CHF on 5/2 following prolonged hospitalization.     Subjective:      Deborah [name] reports lower extremity edema.     Assessment and Plan     A/C HF p EF:  Repeat TTE w/ EF 60-65%. No need to repeat. Continue IV Bumex. No Jardiance due to infection and UTI hx.      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 Continue Amio and mexiletine. Future plans for PVC ablation with Dr. 7/2. Metoprolol was stopped last admission due to hypotension. No significant AV stenosis on repeat TTE    3 Prior bacteremia due to group B Streptococcus: GBS bacteremia complicated by pacemaker line vegetation. Completed Ceftriaxone 05/23/21 after 6 weeks      4 chronic respiratory failure with hypoxia and hypercapnia/COPD: On NC      5 Hypotension: Worked up last admission unclear etiology. Discharged on Midodrine and Florinef. Continue.      6 T 2 DM     7 CKD : stable      8 Obesity: Body mass index is 52.66 kg/m.      9 Thrombocytopenia: plts stable, monitor              ____________________________________________________________    Cardiac testing    TRANSTHORACIC ECHOCARDIOGRAM  (TTE) COMPLETE (CONTRAST/BUBBLE/3D PRN) 05/22/2021 10:53 PM (Final)    Narrative  This is a summary report. The complete report is available in the patient's medical record. If you cannot access the medical record, please contact the sending organization for a detailed fax or copy.      Contrast used: Definity.    Technical qualifiers: Echo study was technically difficult with poor endocardial visualization, limited due to patient's condition, limited due to patient tolerance and technically difficult due to patient's heart rhythm.    Left Ventricle: Normal left ventricular systolic function with a visually estimated EF of 60 - 65%. Left ventricle size is normal. Mildly increased wall thickness. Unable to assess wall motion.    Aortic Valve: Not well visualized. Bioprosthetic valve. AV mean gradient is 34 mmHg. LVOT:AV VTI Index is 0.38.  With a mean PG 34 mmHg, DVI 0.38 and an intermediate jet counter, there is possibly some valve stenosis but likely not significant.    Signed by: 05/25/21, MD on 05/22/2021 10:53 PM    02/16/21    NM STRESS TEST WITH MYOCARDIAL PERFUSION 02/20/2021  4:57 PM (Final)    Narrative  This is a summary report.  The complete report is available in the patient's medical record. If you cannot access the medical record, please contact the sending organization for a detailed fax or copy.      Nuclear Findings: LV perfusion is normal. There is no evidence of inducible ischemia.    Nuclear Findings: The defect appears to be caused by breast attenuation.    ECG: Resting ECG demonstrates normal sinus rhythm.    ECG: Stress ECG was negative for ischemia.    Stress Test: A pharmacological stress test was performed using lexiscan. Hemodynamics are adequate for diagnosis. Blood pressure demonstrated a normal response and heart rate demonstrated a normal response to stress. The patient's heart rate recovery was normal. The patient reported dyspnea and no chest pain during the stress  test.    Ventricular bigeminy  Unifocal PVC, RBBB and superior axis PVCs    Signed by: Deborah Pounds, MD on 02/20/2021  4:57 PM    No results found for this or any previous visit.      Most recent HS troponins:  Recent Labs     06/20/21  1822   TROPHS 15       ECG: Paced with frequent PVC's    Review of Systems:    [x] All other systems reviewed and all negative except as written in HPI    []  Patient unable to provide secondary to condition    Past Medical History:   Diagnosis Date    (HFpEF) heart failure with preserved ejection fraction (HCC)     Anxiety and depression     Aortic valve replaced     S/p bovine aortic valve replacement.    Asthma     Chronic narcotic use     Chronic obstructive pulmonary disease (HCC)     Chronic pain     CKD (chronic kidney disease), stage III (HCC)     Baseline creatinine is 1.3-1.4 with GFR in the 40s.    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)     GERD (gastroesophageal reflux disease)     History of vascular access device 04/13/2021    4 FR Single PICC for LTABX: R cephalic vessell length 48 CM Max P leave @ 1 CM out; Arm circumferenc 40 CM    Hyperlipidemia     Hypothyroidism     Morbid obesity (HCC)     Neuropathy     Obstructive sleep apnea     Rhinitis      Past Surgical History:   Procedure Laterality Date    AORTIC VALVE REPLACEMENT      Bovine bioprosthetic    COLONOSCOPY N/A 12/01/2020    COLONOSCOPY performed by 04/15/2021, MD at [facility]    PACEMAKER       Social Hx:  reports that she quit smoking about 11 years ago. Her smoking use included cigarettes. She smoked an average of 1 pack per day. She has never used smokeless tobacco. She reports that she does not currently use alcohol. She reports that she does not use drugs.  Family Hx: family history includes Hypertension in her father and mother.  Allergies   Allergen Reactions    Nitroglycerin      Other reaction(s): Unknown (comments)  hypotension    Hydrochlorothiazide Other (See Comments)     Reports 'kidneys dry  up"     Aloe Vera Rash          OBJECTIVE:  Wt Readings from Last 3 Encounters:   06/22/21 (!) 158.2 kg (  348 lb 12.3 oz)   06/16/21 (!) 147.9 kg (326 lb)   05/28/21 (!) 152.5 kg (336 lb 3.2 oz)     I/O last 3 completed shifts:  In: -   Out: 2250 [Urine:2250]  I/O this shift:  In: 400 [P.O.:400]  Out: -     Physical Exam:    Vitals:   Vitals:    06/22/21 0716 06/22/21 0720 06/22/21 1109 06/22/21 1118   BP:  (!) 89/49 123/89    Pulse: 81 60 60    Resp: 16 18 18     Temp:  97.9 F (36.6 C) 98.1 F (36.7 C)    TempSrc:  Oral Oral    SpO2: 94% 94% 93%    Weight:    (!) 158.2 kg (348 lb 12.3 oz)   Height:         Telemetry: PVC, Paced    Gen: Well-developed, obese, in no acute distress  Neck: Supple, No JVD, No Carotid Bruit  Resp: No accessory muscle use, Diminished breath sounds, No rales or rhonchi  Card: Regular Rate,Rythm, Normal S1, S2, No murmurs, rubs or gallop. No thrills.   Abd:   Soft, non-tender, non-distended, BS+   MSK: No cyanosis  Skin: No rashes    Neuro: Moving all four extremities, follows commands appropriately  Psych: Good insight, oriented to person, place, alert, Nml Affect  LE: significant generalized edema    Data Review:     Radiology:   XR Results (most recent):  Xray Result (most recent):  XR CHEST STANDARD TWO VW 06/20/2021    Narrative  INDICATION: Reason for exam:->eval pulmonary edema. eval pulmonary edema  Additional history:  COMPARISON: Previous chest xray, 05/22/2021 and 05/17/2021.  Limitations: Patient's body habitus.  Marland Kitchen.  FINDINGS: PA and lateral view of the chest.  .  Lines/tubes/surgical: Cardiac assist device in the left chest with leads that  project to terminate in the right atrium and right ventricle.  Heart/mediastinum: Status post median sternotomy with valvuloplasty.  Lungs/pleura: Hazy opacity over both lungs is at least partially explained by  overlying soft tissue density. No definite acute process or visible focal  consolidation No visualized pleural effusion or  pneumothorax.  Additional Comments: Obesity. Degenerative changes in the spine  .    Impression  1. No definite acute cardiopulmonary disease.      CT Result (most recent):  CTA HEAD NECK W CONTRAST 05/18/2021    Narrative  EXAM:  CTA CODE NEURO HEAD AND NECK W CONT, CT CODE NEURO PERF W CBF    INDICATION:   unequal pupils    COMPARISON:  None.    CONTRAST:  100 mL of Isovue-370.    TECHNIQUE:  Unenhanced scout images were obtained to localize the volume for  acquisition.  Multislice helical axial CT angiography was performed from the  aortic arch to the top of the head during uneventful rapid bolus intravenous  contrast administration.   Coronal and sagittal reformations and 3D/MIP  post  processing were performed.  CT brain perfusion was performed with generation of hemodynamic maps of multiple  parameters, including cerebral blood flow, cerebral blood volume, and MTT (mean  transit time).  CT dose reduction was achieved through use of a standardized protocol tailored  for this examination and automatic exposure control for dose modulation. This  study was analyzed by the [employer] algorithm.    FINDINGS:    CT Brain Perfusion:  Brain parenchymal perfusion study demonstrates apparent regional areas of  elevated Tmax  without significant decreased cerebral blood flow or blood volume  localized to the midbrain/pons, indeterminate.  rCBF < 30% = 0 cc.  Tmax > 6 seconds = 3 cc.    CTA Head:  There is no evidence of large vessel occlusion or flow-limiting stenosis of the  intracranial internal carotid, anterior cerebral, and middle cerebral arteries.  Calcification of the bilateral carotid siphons without significant stenosis. The  anterior communicating artery is patent.    There is no evidence of large vessel occlusion or flow-limiting stenosis of the  intracranial vertebral arteries, basilar artery, or posterior cerebral arteries.  Bilateral posterior communicating arteries are patent.    There is no evidence of  aneurysm or vascular malformation. The dural venous  sinuses and deep cerebral venous system are patent. No evidence of abnormal  enhancement on delayed phase images.      CTA NECK:  NASCET method was utilized for calculating stenosis.    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 Additional bovine  arch aortic branching pattern, developmental variant. The common carotid  arteries demonstrate no significant stenosis. There is no evidence of  significant stenosis in the cervical right internal carotid artery. There is no  evidence of significant stenosis in the cervical left internal carotid artery.  The carotid bulbs are  without significant luminal narrowing.  % of right carotid artery stenosis: 0  % of left carotid artery stenosis: 0    There is a left vertebral artery dominant vertebrobasilar arterial system. The  cervical vertebral arteries are normal in course, size and contour without  significant stenosis.    Mild diffuse mucosal thickening of the paranasal sinuses. The mastoids are free  of fluid bilaterally. Edentulous maxilla. Mandibular dental amalgam associated  metallic artifact precludes optimal evaluation of the adjacent structures, oral  cavity and oropharynx. Visualized soft tissues of the neck are unremarkable.  Visualized lung apices demonstrates left greater than right patchy consolidative  and groundglass lung opacities, likely infectious versus inflammatory.    No acute fracture or aggressive osseous lesion. Reversal of normal cervical  lordosis without significant vertebral listhesis. Multilevel degenerative disc  changes of the cervical spine with areas of at least moderate to severe spinal  canal narrowing C4-C5 through C6-C7 and associated severe right C5-C6 and C6-C7  neural foraminal narrowing. Partially imaged median sternotomy wires.    Impression  CT Brain Perfusion:  Nonspecific hyperintense perfusion deficits in the brainstem may represent  artifacts. However,  acute ischemia is not completely excluded. Clinical clinical  correlation is advised.    CTA Head:  No large vessel occlusion or significant flow-limiting stenosis.    CTA Neck:  No large vessel occlusion or significant flow-limiting stenosis.  Apparent right subclavian artery.    Others:  Cervical spondylosis with areas of at least mild to moderate spinal canal  narrowing and severe neural foraminal narrowing at C4-C7.      Lab Results   Component Value Date/Time    NA 138 06/22/2021 02:53 AM    K 5.0 06/22/2021 02:53 AM    CL 102 06/22/2021 02:53 AM    CO2 34 06/22/2021 02:53 AM    BUN 19 06/22/2021 02:53 AM    CREATININE 1.50 06/22/2021 02:53 AM    GLUCOSE 132 06/22/2021 02:53 AM    CALCIUM 8.9 06/22/2021 02:53 AM    LABGLOM 39 06/22/2021 02:53 AM      Lab Results   Component Value Date    BNP 2,300 (H) 05/26/2021  Lab Results   Component Value Date    WBC 3.3 (L) 06/20/2021    HGB 9.6 (L) 06/20/2021    HCT 32.4 (L) 06/20/2021    MCV 93.4 06/20/2021    PLT 86 (L) 06/20/2021     No results for input(s): CHOL, HDLC, LDLC in the last 72 hours.    Invalid input(s): TGL,  HBA1C    Current meds:    Current Facility-Administered Medications:     albumin human 25% IV solution 12.5 g, 12.5 g, IntraVENous, Daily, Glenetta Hew, MD, Stopped at 06/22/21 1300    fludrocortisone (FLORINEF) tablet 0.1 mg, 0.1 mg, Oral, Daily, Glenetta Hew, MD, 0.1 mg at 06/22/21 1149    mexiletine (MEXITIL) capsule 150 mg, 150 mg, Oral, TID, Glenetta Hew, MD    midodrine (PROAMATINE) tablet 10 mg, 10 mg, Oral, TID, Glenetta Hew, MD    gabapentin (NEURONTIN) capsule 100 mg, 100 mg, Oral, TID, Iris Pert, MD    oxyCODONE (ROXICODONE) immediate release tablet 5 mg, 5 mg, Oral, Q6H PRN, Iris Pert, MD, 5 mg at 06/21/21 2334    sodium chloride flush 0.9 % injection 5-40 mL, 5-40 mL, IntraVENous, 2 times per day, Iris Pert, MD, 10 mL at 06/22/21 0911    sodium chloride flush 0.9 % injection 5-40 mL, 5-40 mL, IntraVENous, PRN,  Iris Pert, MD    0.9 % sodium chloride infusion, , IntraVENous, PRN, Iris Pert, MD    enoxaparin (LOVENOX) injection 40 mg, 40 mg, SubCUTAneous, BID, Iris Pert, MD, 40 mg at 06/22/21 0907    ondansetron (ZOFRAN-ODT) disintegrating tablet 4 mg, 4 mg, Oral, Q8H PRN **OR** ondansetron (ZOFRAN) injection 4 mg, 4 mg, IntraVENous, Q6H PRN, Iris Pert, MD    polyethylene glycol (GLYCOLAX) packet 17 g, 17 g, Oral, Daily PRN, Iris Pert, MD    acetaminophen (TYLENOL) tablet 650 mg, 650 mg, Oral, Q6H PRN **OR** acetaminophen (TYLENOL) suppository 650 mg, 650 mg, Rectal, Q6H PRN, Iris Pert, MD    amiodarone (CORDARONE) tablet 200 mg, 200 mg, Oral, Daily, Iris Pert, MD, 200 mg at 06/22/21 1610    aspirin chewable tablet 81 mg, 81 mg, Oral, Daily, Iris Pert, MD, 81 mg at 06/22/21 0908    atorvastatin (LIPITOR) tablet 10 mg, 10 mg, Oral, Daily, Iris Pert, MD, 10 mg at 06/22/21 0908    budesonide (PULMICORT) nebulizer suspension 500 mcg, 500 mcg, Nebulization, BID, Iris Pert, MD, 500 mcg at 06/22/21 0715    bumetanide (BUMEX) injection 1 mg, 1 mg, IntraVENous, BID, Iris Pert, MD, 1 mg at 06/21/21 1036    citalopram (CELEXA) tablet 40 mg, 40 mg, Oral, Daily, Iris Pert, MD, 40 mg at 06/22/21 0908    fluticasone (FLONASE) 50 MCG/ACT nasal spray 2 spray, 2 spray, Nasal, Daily PRN, Iris Pert, MD    levothyroxine (SYNTHROID) tablet 150 mcg, 150 mcg, Oral, QAM AC, Iris Pert, MD, 150 mcg at 06/22/21 0551    polyethylene glycol (GLYCOLAX) packet 17 g, 17 g, Oral, Daily, Iris Pert, MD    sucralfate (CARAFATE) tablet 1 g, 1 g, Oral, TID, Iris Pert, MD, 1 g at 06/22/21 9604    traZODone (DESYREL) tablet 100 mg, 100 mg, Oral, Nightly, Iris Pert, MD, 100 mg at 06/21/21 2122    ipratropium 0.5 mg-albuterol 2.5 mg (DUONEB) nebulizer solution 1 ampule, 1 ampule, Inhalation, Q6H WA, Glenetta Hew, MD, 1 ampule at 06/22/21 [phone]  glucose chewable  tablet 16 g, 4 tablet, Oral, PRN, Glenetta Hew, MD    dextrose bolus 10% 125 mL, 125 mL, IntraVENous, PRN **OR** dextrose bolus 10% 250 mL, 250 mL, IntraVENous, PRN, Glenetta Hew, MD    glucagon injection 1 mg, 1 mg, SubCUTAneous, PRN, Glenetta Hew, MD    dextrose 10 % infusion, , IntraVENous, Continuous PRN, Glenetta Hew, MD    insulin lispro (HUMALOG) injection vial 0-8 Units, 0-8 Units, SubCUTAneous, TID WC, Glenetta Hew, MD    insulin lispro (HUMALOG) injection vial 0-4 Units, 0-4 Units, SubCUTAneous, Nightly, Glenetta Hew, MD    Greig Right, APRN - NP    [facility]  Call center: (P) [phone]  (F) [phone]      GN:FAOZ Claudean Kinds, APRN - NP

## 2021-06-22 NOTE — Plan of Care (Signed)
Problem: Discharge Planning  Goal: Discharge to home or other facility with appropriate resources  Outcome: Progressing     Problem: Pain  Goal: Verbalizes/displays adequate comfort level or baseline comfort level  Outcome: Progressing  Flowsheets (Taken 06/21/2021 2336 by Vickii Penna, RN)  Verbalizes/displays adequate comfort level or baseline comfort level: Administer analgesics based on type and severity of pain and evaluate response     Problem: Skin/Tissue Integrity  Goal: Absence of new skin breakdown  Description: 1.  Monitor for areas of redness and/or skin breakdown  2.  Assess vascular access sites hourly  3.  Every 4-6 hours minimum:  Change oxygen saturation probe site  4.  Every 4-6 hours:  If on nasal continuous positive airway pressure, respiratory therapy assess nares and determine need for appliance change or resting period.  Outcome: Progressing     Problem: Safety - Adult  Goal: Free from fall injury  Outcome: Progressing     Problem: ABCDS Injury Assessment  Goal: Absence of physical injury  Outcome: Progressing     Problem: Chronic Conditions and Co-morbidities  Goal: Patient's chronic conditions and co-morbidity symptoms are monitored and maintained or improved  Outcome: Progressing     Problem: Respiratory - Adult  Goal: Achieves optimal ventilation and oxygenation  Outcome: Progressing  Goal: Adequate oxygenation  Description: Adequate oxygenation  06/22/2021 0114 by Juan Quam, RCP  Outcome: Progressing

## 2021-06-22 NOTE — Progress Notes (Signed)
C/ pacer ck/thresholds  Device functioning appropriately as programmed.   See scanned documents

## 2021-06-22 NOTE — Other (Signed)
Met with patient at bedside.  Patient states she is feeling tired and sleepy this morning.  Reviewed rationale for daily weight and weight parameters and gave patient charitable scale for home use.

## 2021-06-22 NOTE — Progress Notes (Signed)
Hospitalist Progress Note      NAME:  Deborah [name]   DOB:  1955-06-18  MRM:  [id number]    Date/Time: 06/22/2021  5:43 PM           Assessment / Plan:     47F hx COPD, HFpEF p/w sob, weight gain, leg swelling, hypoxia    #AHRF: Due to HFpEF as well as COPD and MOHS.    - Diurese; nightly CPAP    #Acute exacerbation of HFpEF: HX AV replacement. She is at least 15 lb up from her baseline and has anasarca. Bumex had been decreased as outpt prior to this d/t hypotension.    - IV diuresis, daily albumin    - I suspect she will need several days of diuresis   - Strict I/O; daily weight   - Echo 1 month ago, no need for repeat   - abd u/s to eval cirrhosis   - Midodrine for low BP   - Appreciate Cards following; no SGLT2i given hx ESBL UTI; dc florinef 2/2 fluid retention    #AKI: Cardiorenal in s/o above; improving with diuresis   - Diurese   - Renally dose meds, avoid nephrotoxins    #Hx PVCs / PPM:    - Cont amio, metop  #COPD: Not in exacerbation   - Con thome nebs    #Hypothyroidism: Lt4    #HTN: metop    #DM? Bgs fairly high, but last A1c 5.7.    - SSI    #Morbid Obesity: BMI 54; c/b MOHS. Rec weight loss    I have personally reviewed the radiographs, laboratory data in Epic and decisions and statements above are based partially on this personal interpretation.                 Care Plan discussed with: Patient, Care Manager, Nursing Staff, and Consultant/Specialist    Discussed:  Care Plan    Prophylaxis:  Lovenox    Disposition:  Home w/Family           ___________________________________________________    Attending Physician: Glenetta Hew, MD        Subjective:     Chief Complaint:  No acute events overnight. Feels like she is urinating well, and legs are a little less tight today    ROS:  (bold if positive, if negative)    Tolerating PT  Tolerating Diet          Objective:       Vitals:          Last 24hrs VS reviewed since prior progress note. Most recent are:    Vitals:    06/22/21 1730   BP: 110/73    Pulse: 84   Resp:    Temp:    SpO2:      SpO2 Readings from Last 6 Encounters:   06/22/21 94%   06/16/21 98%          Intake/Output Summary (Last 24 hours) at 06/22/2021 1743  Last data filed at 06/22/2021 1414  Gross per 24 hour   Intake 1000 ml   Output 1350 ml   Net -350 ml            Exam:     Physical Exam:    Gen:  Well-developed, well-nourished, in no acute distress; morbidly obese  HEENT:  Pink conjunctivae, PERRL, hearing intact to voice, moist mucous membranes  Neck:  Supple, without masses, thyroid non-tender  Resp:  No accessory muscle use, clear  breath sounds without wheezes rales or rhonchi  Card:  No murmurs, normal S1, S2 without thrills, bruits; anasarca pitting abdomen  Abd:  Soft, non-tender, non-distended, normoactive bowel sounds are present  Musc:  No cyanosis or clubbing  Skin:  No rashes or ulcers, skin turgor is good  Neuro:  Cranial nerves 3-12 are grossly intact, grip strength is 5/5 bilaterally and dorsi / plantarflexion is 5/5 bilaterally, follows commands appropriately  Psych:  Good insight, oriented to person, place and time, alert       Medications Reviewed: (see below)    Lab Data Reviewed: (see below)    ______________________________________________________________________    Medications:     Current Facility-Administered Medications   Medication Dose Route Frequency    albumin human 25% IV solution 12.5 g  12.5 g IntraVENous Daily    mexiletine (MEXITIL) capsule 150 mg  150 mg Oral TID    midodrine (PROAMATINE) tablet 10 mg  10 mg Oral TID    gabapentin (NEURONTIN) capsule 100 mg  100 mg Oral TID    oxyCODONE (ROXICODONE) immediate release tablet 5 mg  5 mg Oral Q6H PRN    sodium chloride flush 0.9 % injection 5-40 mL  5-40 mL IntraVENous 2 times per day    sodium chloride flush 0.9 % injection 5-40 mL  5-40 mL IntraVENous PRN    0.9 % sodium chloride infusion   IntraVENous PRN    enoxaparin (LOVENOX) injection 40 mg  40 mg SubCUTAneous BID    ondansetron (ZOFRAN-ODT)  disintegrating tablet 4 mg  4 mg Oral Q8H PRN    Or    ondansetron (ZOFRAN) injection 4 mg  4 mg IntraVENous Q6H PRN    polyethylene glycol (GLYCOLAX) packet 17 g  17 g Oral Daily PRN    acetaminophen (TYLENOL) tablet 650 mg  650 mg Oral Q6H PRN    Or    acetaminophen (TYLENOL) suppository 650 mg  650 mg Rectal Q6H PRN    amiodarone (CORDARONE) tablet 200 mg  200 mg Oral Daily    aspirin chewable tablet 81 mg  81 mg Oral Daily    atorvastatin (LIPITOR) tablet 10 mg  10 mg Oral Daily    budesonide (PULMICORT) nebulizer suspension 500 mcg  500 mcg Nebulization BID    bumetanide (BUMEX) injection 1 mg  1 mg IntraVENous BID    citalopram (CELEXA) tablet 40 mg  40 mg Oral Daily    fluticasone (FLONASE) 50 MCG/ACT nasal spray 2 spray  2 spray Nasal Daily PRN    levothyroxine (SYNTHROID) tablet 150 mcg  150 mcg Oral QAM AC    polyethylene glycol (GLYCOLAX) packet 17 g  17 g Oral Daily    sucralfate (CARAFATE) tablet 1 g  1 g Oral TID    traZODone (DESYREL) tablet 100 mg  100 mg Oral Nightly    ipratropium 0.5 mg-albuterol 2.5 mg (DUONEB) nebulizer solution 1 ampule  1 ampule Inhalation Q6H WA    glucose chewable tablet 16 g  4 tablet Oral PRN    dextrose bolus 10% 125 mL  125 mL IntraVENous PRN    Or    dextrose bolus 10% 250 mL  250 mL IntraVENous PRN    glucagon injection 1 mg  1 mg SubCUTAneous PRN    dextrose 10 % infusion   IntraVENous Continuous PRN    insulin lispro (HUMALOG) injection vial 0-8 Units  0-8 Units SubCUTAneous TID WC    insulin lispro (HUMALOG) injection vial 0-4 Units  0-4  Units SubCUTAneous Nightly            Lab Review:     Recent Labs     06/20/21  1822   WBC 3.3*   HGB 9.6*   HCT 32.4*   PLT 86*       Recent Labs     06/20/21  1822 06/22/21  0253   NA 141 138   K 4.1 5.0   CL 106 102   CO2 33* 34*   BUN 18 19   ALT 11*  --        No components found for: [facility]

## 2021-06-23 LAB — POCT GLUCOSE
POC Glucose: 202 mg/dL — ABNORMAL HIGH (ref 65–117)
POC Glucose: 138 mg/dL — ABNORMAL HIGH (ref 65–117)
POC Glucose: 137 mg/dL — ABNORMAL HIGH (ref 65–117)
POC Glucose: 133 mg/dL — ABNORMAL HIGH (ref 65–117)

## 2021-06-23 LAB — BASIC METABOLIC PANEL
Anion Gap: 2 mmol/L — ABNORMAL LOW (ref 5–15)
BUN: 19 MG/DL (ref 6–20)
Bun/Cre Ratio: 14 (ref 12–20)
CO2: 36 mmol/L — ABNORMAL HIGH (ref 21–32)
Calcium: 8.9 MG/DL (ref 8.5–10.1)
Chloride: 100 mmol/L (ref 97–108)
Creatinine: 1.38 MG/DL — ABNORMAL HIGH (ref 0.55–1.02)
Est, Glom Filt Rate: 43 mL/min/{1.73_m2} — ABNORMAL LOW (ref >60)
Glucose: 129 mg/dL — ABNORMAL HIGH (ref 65–100)
Potassium: 4.7 mmol/L (ref 3.5–5.1)
Sodium: 138 mmol/L (ref 136–145)

## 2021-06-23 MED FILL — POLYETHYLENE GLYCOL 3350 17 G PO PACK: 17 g | ORAL | Qty: 1

## 2021-06-23 MED FILL — CARAFATE 1 G PO TABS: 1 g | ORAL | Qty: 1

## 2021-06-23 MED FILL — BUDESONIDE 0.5 MG/2ML IN SUSP: 0.5 MG/2ML | RESPIRATORY_TRACT | Qty: 2

## 2021-06-23 MED FILL — ALBUTEIN 25 % IV SOLN: 25 % | INTRAVENOUS | Qty: 50

## 2021-06-23 MED FILL — LEVOTHYROXINE SODIUM 150 MCG PO TABS: 150 MCG | ORAL | Qty: 1

## 2021-06-23 MED FILL — IPRATROPIUM-ALBUTEROL 0.5-2.5 (3) MG/3ML IN SOLN: RESPIRATORY_TRACT | Qty: 3

## 2021-06-23 MED FILL — TRAZODONE HCL 100 MG PO TABS: 100 MG | ORAL | Qty: 1

## 2021-06-23 MED FILL — ATORVASTATIN CALCIUM 10 MG PO TABS: 10 MG | ORAL | Qty: 1

## 2021-06-23 MED FILL — MIDODRINE HCL 5 MG PO TABS: 5 MG | ORAL | Qty: 2

## 2021-06-23 MED FILL — ENOXAPARIN SODIUM 40 MG/0.4ML IJ SOSY: 40 MG/0.4ML | INTRAMUSCULAR | Qty: 0.4

## 2021-06-23 MED FILL — MEXILETINE HCL 150 MG PO CAPS: 150 MG | ORAL | Qty: 1

## 2021-06-23 MED FILL — CITALOPRAM HYDROBROMIDE 20 MG PO TABS: 20 MG | ORAL | Qty: 2

## 2021-06-23 MED FILL — ASPIRIN 81 MG PO CHEW: 81 MG | ORAL | Qty: 1

## 2021-06-23 MED FILL — AMIODARONE HCL 200 MG PO TABS: 200 MG | ORAL | Qty: 1

## 2021-06-23 MED FILL — BUMETANIDE 0.25 MG/ML IJ SOLN: 0.25 MG/ML | INTRAMUSCULAR | Qty: 10

## 2021-06-23 MED FILL — GABAPENTIN 100 MG PO CAPS: 100 MG | ORAL | Qty: 1

## 2021-06-23 MED FILL — OXYCODONE HCL 5 MG PO TABS: 5 MG | ORAL | Qty: 1

## 2021-06-23 NOTE — Plan of Care (Signed)
Problem: Discharge Planning  Goal: Discharge to home or other facility with appropriate resources  06/23/2021 1401 by Ranae Plumber, RN  Outcome: Progressing  06/23/2021 0711 by Sherley Bounds, LPN  Outcome: Progressing  06/23/2021 0706 by Sherley Bounds, LPN  Outcome: Progressing     Problem: Pain  Goal: Verbalizes/displays adequate comfort level or baseline comfort level  06/23/2021 1401 by Ranae Plumber, RN  Outcome: Progressing  06/23/2021 0711 by Sherley Bounds, LPN  Outcome: Progressing  Note: Pt medicated w/ oxycodone 5mg  once during 1900-0700 shift.  06/23/2021 0706 by 06/25/2021, LPN  Outcome: Progressing  Note: Pt reported adequate pain control. Pt medicated w/ oxycodone 5mg  once during 1900-0700 shift.     Problem: Skin/Tissue Integrity  Goal: Absence of new skin breakdown  Description: 1.  Monitor for areas of redness and/or skin breakdown  2.  Assess vascular access sites hourly  3.  Every 4-6 hours minimum:  Change oxygen saturation probe site  4.  Every 4-6 hours:  If on nasal continuous positive airway pressure, respiratory therapy assess nares and determine need for appliance change or resting period.  06/23/2021 1401 by , RN  Outcome: Progressing  06/23/2021 0711 by Ranae Plumber, LPN  Outcome: Progressing  Note: Encouraged pt to reposition in bed and ambulate.  06/23/2021 0706 by Sherley Bounds, LPN  Outcome: Progressing  Note: Encouraged pt to ambulate and reposition in bed w/out success. Offered to elevate pt's BLE , pt declined.     Problem: Safety - Adult  Goal: Free from fall injury  06/23/2021 1401 by Sherley Bounds, RN  Outcome: Progressing  06/23/2021 0711 by Ranae Plumber, LPN  Outcome: Progressing  06/23/2021 0706 by Sherley Bounds, LPN  Outcome: Progressing  Note: Bed in low position and locked. Call light and phone w/in reach.     Problem: ABCDS Injury Assessment  Goal: Absence of physical injury  06/23/2021 1401 by Sherley Bounds, RN  Outcome: Progressing  06/23/2021 0711 by Ranae Plumber, LPN  Outcome:  Progressing  06/23/2021 0706 by Sherley Bounds, LPN  Outcome: Progressing     Problem: Chronic Conditions and Co-morbidities  Goal: Patient's chronic conditions and co-morbidity symptoms are monitored and maintained or improved  06/23/2021 1401 by Sherley Bounds, RN  Outcome: Progressing  06/23/2021 0711 by Ranae Plumber, LPN  Outcome: Progressing  06/23/2021 0706 by Sherley Bounds, LPN  Outcome: Progressing     Problem: Respiratory - Adult  Goal: Achieves optimal ventilation and oxygenation  06/23/2021 1401 by Sherley Bounds, RN  Outcome: Progressing  06/23/2021 0711 by Ranae Plumber, LPN  Outcome: Progressing  06/23/2021 0706 by Sherley Bounds, LPN  Outcome: Progressing

## 2021-06-23 NOTE — Progress Notes (Signed)
Hospitalist Progress Note      NAME:  Deborah [name]   DOB:  10-05-1955  MRM:  [id number]    Date/Time: 06/23/2021  1:25 PM           Assessment / Plan:     55F hx COPD, HFpEF p/w sob, weight gain, leg swelling, hypoxia    #AHRF: Due to HFpEF as well as COPD and MOHS. Slowly improving   - Diurese; nightly CPAP    #Acute exacerbation of HFpEF: HX AV replacement.Bumex had been decreased as outpt prior to this d/t hypotension. Diuresing but still has significant anasarca   - IV diuresis, daily albumin    - I suspect she will need several days of diuresis   - Strict I/O; daily weight   - Echo 1 month ago, no need for repeat   - abd u/s to eval cirrhosis - negative   - Midodrine for low BP   - Appreciate Cards following; no SGLT2i given hx ESBL UTI; dc florinef 2/2 fluid retention    #AKI: Cardiorenal in s/o above; improving with diuresis   - Diurese   - Renally dose meds, avoid nephrotoxins    #Hx PVCs / PPM:    - Cont amio, metop  #COPD: Not in exacerbation   - Con thome nebs    #Hypothyroidism: Lt4    #HTN: metop    #DM? Bgs fairly high, but last A1c 5.7.    - SSI    #Morbid Obesity: BMI 54; c/b MOHS. Rec weight loss    I have personally reviewed the radiographs, laboratory data in Epic and decisions and statements above are based partially on this personal interpretation.                 Care Plan discussed with: Patient, Care Manager, Nursing Staff, and Consultant/Specialist    Discussed:  Care Plan    Prophylaxis:  Lovenox    Disposition:  Home w/Family           ___________________________________________________    Attending Physician: Glenetta Hew, MD        Subjective:     Chief Complaint:  No acute events overnight. Legs feel much better today. Abd still full    ROS:  (bold if positive, if negative)    Tolerating PT  Tolerating Diet          Objective:       Vitals:          Last 24hrs VS reviewed since prior progress note. Most recent are:    Vitals:    06/23/21 1053   BP: (!) 100/44   Pulse: 66    Resp: 20   Temp: 98 F (36.7 C)   SpO2: 90%     SpO2 Readings from Last 6 Encounters:   06/23/21 90%   06/16/21 98%          Intake/Output Summary (Last 24 hours) at 06/23/2021 1325  Last data filed at 06/23/2021 0430  Gross per 24 hour   Intake 1171 ml   Output 3550 ml   Net -2379 ml            Exam:     Physical Exam:    Gen:  Well-developed, well-nourished, in no acute distress; morbidly obese  HEENT:  Pink conjunctivae, PERRL, hearing intact to voice, moist mucous membranes  Neck:  Supple, without masses, thyroid non-tender  Resp:  No accessory muscle use, clear breath sounds without wheezes rales or rhonchi  Card:  No murmurs, normal S1, S2 without thrills, bruits; anasarca pitting abdomen  Abd:  Soft, non-tender, non-distended, normoactive bowel sounds are present  Musc:  No cyanosis or clubbing  Skin:  No rashes or ulcers, skin turgor is good  Neuro:  Cranial nerves 3-12 are grossly intact, grip strength is 5/5 bilaterally and dorsi / plantarflexion is 5/5 bilaterally, follows commands appropriately  Psych:  Good insight, oriented to person, place and time, alert       Medications Reviewed: (see below)    Lab Data Reviewed: (see below)    ______________________________________________________________________    Medications:     Current Facility-Administered Medications   Medication Dose Route Frequency    albumin human 25% IV solution 12.5 g  12.5 g IntraVENous Daily    mexiletine (MEXITIL) capsule 150 mg  150 mg Oral TID    midodrine (PROAMATINE) tablet 10 mg  10 mg Oral TID    gabapentin (NEURONTIN) capsule 100 mg  100 mg Oral TID    oxyCODONE (ROXICODONE) immediate release tablet 5 mg  5 mg Oral Q6H PRN    sodium chloride flush 0.9 % injection 5-40 mL  5-40 mL IntraVENous 2 times per day    sodium chloride flush 0.9 % injection 5-40 mL  5-40 mL IntraVENous PRN    0.9 % sodium chloride infusion   IntraVENous PRN    enoxaparin (LOVENOX) injection 40 mg  40 mg SubCUTAneous BID    ondansetron (ZOFRAN-ODT)  disintegrating tablet 4 mg  4 mg Oral Q8H PRN    Or    ondansetron (ZOFRAN) injection 4 mg  4 mg IntraVENous Q6H PRN    polyethylene glycol (GLYCOLAX) packet 17 g  17 g Oral Daily PRN    acetaminophen (TYLENOL) tablet 650 mg  650 mg Oral Q6H PRN    Or    acetaminophen (TYLENOL) suppository 650 mg  650 mg Rectal Q6H PRN    amiodarone (CORDARONE) tablet 200 mg  200 mg Oral Daily    aspirin chewable tablet 81 mg  81 mg Oral Daily    atorvastatin (LIPITOR) tablet 10 mg  10 mg Oral Daily    budesonide (PULMICORT) nebulizer suspension 500 mcg  500 mcg Nebulization BID    bumetanide (BUMEX) injection 1 mg  1 mg IntraVENous BID    citalopram (CELEXA) tablet 40 mg  40 mg Oral Daily    fluticasone (FLONASE) 50 MCG/ACT nasal spray 2 spray  2 spray Nasal Daily PRN    levothyroxine (SYNTHROID) tablet 150 mcg  150 mcg Oral QAM AC    polyethylene glycol (GLYCOLAX) packet 17 g  17 g Oral Daily    sucralfate (CARAFATE) tablet 1 g  1 g Oral TID    traZODone (DESYREL) tablet 100 mg  100 mg Oral Nightly    ipratropium 0.5 mg-albuterol 2.5 mg (DUONEB) nebulizer solution 1 ampule  1 ampule Inhalation Q6H WA    glucose chewable tablet 16 g  4 tablet Oral PRN    dextrose bolus 10% 125 mL  125 mL IntraVENous PRN    Or    dextrose bolus 10% 250 mL  250 mL IntraVENous PRN    glucagon injection 1 mg  1 mg SubCUTAneous PRN    dextrose 10 % infusion   IntraVENous Continuous PRN    insulin lispro (HUMALOG) injection vial 0-8 Units  0-8 Units SubCUTAneous TID WC    insulin lispro (HUMALOG) injection vial 0-4 Units  0-4 Units SubCUTAneous Nightly  Lab Review:     Recent Labs     06/20/21  1822   WBC 3.3*   HGB 9.6*   HCT 32.4*   PLT 86*       Recent Labs     06/20/21  1822 06/22/21  0253 06/23/21  0243   NA 141 138 138   K 4.1 5.0 4.7   CL 106 102 100   CO2 33* 34* 36*   BUN 18 19 19    ALT 11*  --   --        No components found for: [facility]

## 2021-06-23 NOTE — Plan of Care (Signed)
Problem: Discharge Planning  Goal: Discharge to home or other facility with appropriate resources  06/23/2021 0711 by Sherley Bounds, LPN  Outcome: Progressing  06/23/2021 0706 by Sherley Bounds, LPN  Outcome: Progressing     Problem: Pain  Goal: Verbalizes/displays adequate comfort level or baseline comfort level  06/23/2021 0711 by Sherley Bounds, LPN  Outcome: Progressing  Note: Pt medicated w/ oxycodone 5mg  once during 1900-0700 shift.  06/23/2021 0706 by 06/25/2021, LPN  Outcome: Progressing  Note: Pt reported adequate pain control. Pt medicated w/ oxycodone 5mg  once during 1900-0700 shift.     Problem: Skin/Tissue Integrity  Goal: Absence of new skin breakdown  Description: 1.  Monitor for areas of redness and/or skin breakdown  2.  Assess vascular access sites hourly  3.  Every 4-6 hours minimum:  Change oxygen saturation probe site  4.  Every 4-6 hours:  If on nasal continuous positive airway pressure, respiratory therapy assess nares and determine need for appliance change or resting period.  06/23/2021 0711 by , LPN  Outcome: Progressing  Note: Encouraged pt to reposition in bed and ambulate.  06/23/2021 0706 by Sherley Bounds, LPN  Outcome: Progressing  Note: Encouraged pt to ambulate and reposition in bed w/out success. Offered to elevate pt's BLE , pt declined.     Problem: Safety - Adult  Goal: Free from fall injury  06/23/2021 0711 by 06/25/2021, LPN  Outcome: Progressing  06/23/2021 0706 by Sherley Bounds, LPN  Outcome: Progressing  Note: Bed in low position and locked. Call light and phone w/in reach.     Problem: ABCDS Injury Assessment  Goal: Absence of physical injury  06/23/2021 0711 by Sherley Bounds, LPN  Outcome: Progressing  06/23/2021 0706 by Sherley Bounds, LPN  Outcome: Progressing     Problem: Chronic Conditions and Co-morbidities  Goal: Patient's chronic conditions and co-morbidity symptoms are monitored and maintained or improved  06/23/2021 0711 by Sherley Bounds, LPN  Outcome: Progressing  06/23/2021 0706 by Sherley Bounds, LPN  Outcome: Progressing     Problem: Respiratory - Adult  Goal: Achieves optimal ventilation and oxygenation  06/23/2021 0711 by Sherley Bounds, LPN  Outcome: Progressing  06/23/2021 0706 by Sherley Bounds, LPN  Outcome: Progressing  06/22/2021 2006 by Sherley Bounds, RCP  Outcome: Progressing  Goal: Adequate oxygenation  Description: Adequate oxygenation  06/22/2021 2006 by Sharion Settler, RCP  Outcome: Progressing

## 2021-06-23 NOTE — Progress Notes (Signed)
2100: IV bumex due, 1956 BP was 95/62 MAP 73. Repeat BP at 2112 was 94/47 MAP 63. Din MD messaged, advised to hold bumex.   PCT took VS at 2327, BP now 89/43 MAP 58. Din MD messaged, gave a one time order for albumin 25 g. Will recheck BP after albumin infusion.

## 2021-06-24 LAB — POCT GLUCOSE
POC Glucose: 157 mg/dL — ABNORMAL HIGH (ref 65–117)
POC Glucose: 126 mg/dL — ABNORMAL HIGH (ref 65–117)
POC Glucose: 187 mg/dL — ABNORMAL HIGH (ref 65–117)
POC Glucose: 109 mg/dL (ref 65–117)

## 2021-06-24 LAB — BLOOD GAS, ARTERIAL
Base Excess, Arterial: 8.5 mmol/L
HCO3, Arterial: 36 mmol/L — ABNORMAL HIGH (ref 22–26)
O2 FLOW RATE, POC: 2 L/min
POC O2 SAT: 92 % (ref 92–97)
pCO2, Arterial: 64 mmHg — ABNORMAL HIGH (ref 35–45)
pH, Arterial: 7.37 (ref 7.35–7.45)
pO2, Arterial: 66 mmHg — ABNORMAL LOW (ref 80–100)

## 2021-06-24 LAB — BASIC METABOLIC PANEL
Anion Gap: 3 mmol/L — ABNORMAL LOW (ref 5–15)
BUN: 18 MG/DL (ref 6–20)
Bun/Cre Ratio: 14 (ref 12–20)
CO2: 34 mmol/L — ABNORMAL HIGH (ref 21–32)
Calcium: 9.3 MG/DL (ref 8.5–10.1)
Chloride: 101 mmol/L (ref 97–108)
Creatinine: 1.26 MG/DL — ABNORMAL HIGH (ref 0.55–1.02)
Est, Glom Filt Rate: 48 mL/min/{1.73_m2} — ABNORMAL LOW (ref >60)
Glucose: 115 mg/dL — ABNORMAL HIGH (ref 65–100)
Potassium: 4.5 mmol/L (ref 3.5–5.1)
Sodium: 138 mmol/L (ref 136–145)

## 2021-06-24 MED ORDER — METOLAZONE 5 MG PO TABS
5 MG | Freq: Every day | ORAL | Status: DC
Start: 2021-06-24 — End: 2021-06-28
  Administered 2021-06-24 – 2021-06-26 (×3): 5 mg via ORAL

## 2021-06-24 MED ORDER — ALBUMIN HUMAN 25 % IV SOLN
25 % | Freq: Once | INTRAVENOUS | Status: AC
Start: 2021-06-24 — End: 2021-06-24
  Administered 2021-06-24: 04:00:00 25 g via INTRAVENOUS

## 2021-06-24 MED FILL — METOLAZONE 5 MG PO TABS: 5 MG | ORAL | Qty: 1

## 2021-06-24 MED FILL — BUMETANIDE 0.25 MG/ML IJ SOLN: 0.25 MG/ML | INTRAMUSCULAR | Qty: 10

## 2021-06-24 MED FILL — MIDODRINE HCL 5 MG PO TABS: 5 MG | ORAL | Qty: 2

## 2021-06-24 MED FILL — TRAZODONE HCL 100 MG PO TABS: 100 MG | ORAL | Qty: 1

## 2021-06-24 MED FILL — CARAFATE 1 G PO TABS: 1 g | ORAL | Qty: 1

## 2021-06-24 MED FILL — ATORVASTATIN CALCIUM 10 MG PO TABS: 10 MG | ORAL | Qty: 1

## 2021-06-24 MED FILL — POLYETHYLENE GLYCOL 3350 17 G PO PACK: 17 g | ORAL | Qty: 1

## 2021-06-24 MED FILL — ENOXAPARIN SODIUM 40 MG/0.4ML IJ SOSY: 40 MG/0.4ML | INTRAMUSCULAR | Qty: 0.4

## 2021-06-24 MED FILL — IPRATROPIUM-ALBUTEROL 0.5-2.5 (3) MG/3ML IN SOLN: RESPIRATORY_TRACT | Qty: 3

## 2021-06-24 MED FILL — CITALOPRAM HYDROBROMIDE 20 MG PO TABS: 20 MG | ORAL | Qty: 2

## 2021-06-24 MED FILL — LEVOTHYROXINE SODIUM 150 MCG PO TABS: 150 MCG | ORAL | Qty: 1

## 2021-06-24 MED FILL — ASPIRIN 81 MG PO CHEW: 81 MG | ORAL | Qty: 1

## 2021-06-24 MED FILL — MEXILETINE HCL 150 MG PO CAPS: 150 MG | ORAL | Qty: 1

## 2021-06-24 MED FILL — ALBUTEIN 25 % IV SOLN: 25 % | INTRAVENOUS | Qty: 50

## 2021-06-24 MED FILL — ALBUTEIN 25 % IV SOLN: 25 % | INTRAVENOUS | Qty: 100

## 2021-06-24 MED FILL — GABAPENTIN 100 MG PO CAPS: 100 MG | ORAL | Qty: 1

## 2021-06-24 MED FILL — BUDESONIDE 0.5 MG/2ML IN SUSP: 0.5 MG/2ML | RESPIRATORY_TRACT | Qty: 2

## 2021-06-24 MED FILL — AMIODARONE HCL 200 MG PO TABS: 200 MG | ORAL | Qty: 1

## 2021-06-24 NOTE — Progress Notes (Addendum)
Hospitalist Progress Note      NAME:  Deborah LoftDonna Arca   DOB:  Jul 23, 1955  MRM:  [id number]    Date/Time: 06/24/2021  11:50 AM           Assessment / Plan:     11F hx COPD, HFpEF p/w sob, weight gain, leg swelling, hypoxia    #AHRF: Due to HFpEF as well as COPD and MOHS. Slowly improving. She is very sleepy today and has not had her home cpap available. ABG reassuring against narcosis though   - Diurese; nightly and naptime PCAP    #Acute exacerbation of HFpEF: HX AV replacement.Bumex had been decreased as outpt prior to this d/t hypotension. Did not receive any bumex yesterday for unclear reasons, presumably asymptomatic hypotension   - DO NOT HOLD BUMEX WITHOUT CALLING MD   - IV diuresis, daily albumin; added metolazone    - I suspect she will need several more days of diuresis    - May need acetazolamide   - Strict I/O; daily weight   - Echo 1 month ago, no need for repeat   - abd u/s to eval cirrhosis - negative   - Midodrine for low BP   - Appreciate Cards following; no SGLT2i given hx ESBL UTI; dc florinef 2/2 fluid retention    #AKI: Cardiorenal in s/o above; improving with diuresis   - Diurese   - Renally dose meds, avoid nephrotoxins    #Hx PVCs / PPM:    - Cont amio, metop  #COPD: Not in exacerbation   - Con thome nebs    #Hypothyroidism: Lt4 125mcg    #HTN: metop    #DM? Bgs fairly high, but last A1c 5.7.    - SSI    #Morbid Obesity: BMI 54; c/b MOHS. Rec weight loss    I have personally reviewed the radiographs, laboratory data in Epic and decisions and statements above are based partially on this personal interpretation.                 Care Plan discussed with: Patient, Care Manager, Nursing Staff, and Consultant/Specialist    Discussed:  Care Plan    Prophylaxis:  Lovenox    Disposition:  Home w/Family           ___________________________________________________    Attending Physician: Glenetta HewAndrew Delando Satter, MD        Subjective:     Chief Complaint:  No acute events overnight. Has not received last 3 doses of  bumex. Reinforced to give this morning. She feels, unsurprisingly, about the same    ROS:  (bold if positive, if negative)    Tolerating PT  Tolerating Diet          Objective:       Vitals:          Last 24hrs VS reviewed since prior progress note. Most recent are:    Vitals:    06/24/21 1116   BP: 105/65   Pulse: 70   Resp: 18   Temp: 97.9 F (36.6 C)   SpO2: 91%     SpO2 Readings from Last 6 Encounters:   06/24/21 91%   06/16/21 98%          Intake/Output Summary (Last 24 hours) at 06/24/2021 1150  Last data filed at 06/24/2021 0739  Gross per 24 hour   Intake --   Output 3150 ml   Net -3150 ml            Exam:  Physical Exam:    Gen:  Well-developed, well-nourished, in no acute distress; morbidly obese; fatigued but not lethargic   HEENT:  Pink conjunctivae, PERRL, hearing intact to voice, moist mucous membranes  Neck:  Supple, without masses, thyroid non-tender  Resp:  No accessory muscle use, clear breath sounds without wheezes rales or rhonchi  Card:  No murmurs, normal S1, S2 without thrills, bruits; anasarca pitting abdomen  Abd:  Soft, non-tender, non-distended, normoactive bowel sounds are present  Musc:  No cyanosis or clubbing  Skin:  No rashes or ulcers, skin turgor is good  Neuro:  Cranial nerves 3-12 are grossly intact, grip strength is 5/5 bilaterally and dorsi / plantarflexion is 5/5 bilaterally, follows commands appropriately  Psych:  Good insight, oriented to person, place and time, alert       Medications Reviewed: (see below)    Lab Data Reviewed: (see below)    ______________________________________________________________________    Medications:     Current Facility-Administered Medications   Medication Dose Route Frequency    metOLazone (ZAROXOLYN) tablet 5 mg  5 mg Oral Daily    albumin human 25% IV solution 12.5 g  12.5 g IntraVENous Daily    mexiletine (MEXITIL) capsule 150 mg  150 mg Oral TID    midodrine (PROAMATINE) tablet 10 mg  10 mg Oral TID    gabapentin (NEURONTIN) capsule 100  mg  100 mg Oral TID    oxyCODONE (ROXICODONE) immediate release tablet 5 mg  5 mg Oral Q6H PRN    sodium chloride flush 0.9 % injection 5-40 mL  5-40 mL IntraVENous 2 times per day    sodium chloride flush 0.9 % injection 5-40 mL  5-40 mL IntraVENous PRN    0.9 % sodium chloride infusion   IntraVENous PRN    enoxaparin (LOVENOX) injection 40 mg  40 mg SubCUTAneous BID    ondansetron (ZOFRAN-ODT) disintegrating tablet 4 mg  4 mg Oral Q8H PRN    Or    ondansetron (ZOFRAN) injection 4 mg  4 mg IntraVENous Q6H PRN    polyethylene glycol (GLYCOLAX) packet 17 g  17 g Oral Daily PRN    acetaminophen (TYLENOL) tablet 650 mg  650 mg Oral Q6H PRN    Or    acetaminophen (TYLENOL) suppository 650 mg  650 mg Rectal Q6H PRN    amiodarone (CORDARONE) tablet 200 mg  200 mg Oral Daily    aspirin chewable tablet 81 mg  81 mg Oral Daily    atorvastatin (LIPITOR) tablet 10 mg  10 mg Oral Daily    budesonide (PULMICORT) nebulizer suspension 500 mcg  500 mcg Nebulization BID    bumetanide (BUMEX) injection 1 mg  1 mg IntraVENous BID    citalopram (CELEXA) tablet 40 mg  40 mg Oral Daily    fluticasone (FLONASE) 50 MCG/ACT nasal spray 2 spray  2 spray Nasal Daily PRN    levothyroxine (SYNTHROID) tablet 150 mcg  150 mcg Oral QAM AC    polyethylene glycol (GLYCOLAX) packet 17 g  17 g Oral Daily    sucralfate (CARAFATE) tablet 1 g  1 g Oral TID    traZODone (DESYREL) tablet 100 mg  100 mg Oral Nightly    ipratropium 0.5 mg-albuterol 2.5 mg (DUONEB) nebulizer solution 1 ampule  1 ampule Inhalation Q6H WA    glucose chewable tablet 16 g  4 tablet Oral PRN    dextrose bolus 10% 125 mL  125 mL IntraVENous PRN    Or  dextrose bolus 10% 250 mL  250 mL IntraVENous PRN    glucagon injection 1 mg  1 mg SubCUTAneous PRN    dextrose 10 % infusion   IntraVENous Continuous PRN    insulin lispro (HUMALOG) injection vial 0-8 Units  0-8 Units SubCUTAneous TID WC    insulin lispro (HUMALOG) injection vial 0-4 Units  0-4 Units SubCUTAneous Nightly             Lab Review:     No results for input(s): WBC, HGB, HCT, PLT in the last 72 hours.    Recent Labs     06/22/21  0253 06/23/21  0243 06/24/21  0310   NA 138 138 138   K 5.0 4.7 4.5   CL 102 100 101   CO2 34* 36* 34*   BUN 19 19 18        No components found for: [facility]

## 2021-06-24 NOTE — Plan of Care (Signed)
Problem: Discharge Planning  Goal: Discharge to home or other facility with appropriate resources  Outcome: HH/HSPC Progressing     Problem: Pain  Goal: Verbalizes/displays adequate comfort level or baseline comfort level  Outcome: HH/HSPC Progressing     Problem: Skin/Tissue Integrity  Goal: Absence of new skin breakdown  Description: 1.  Monitor for areas of redness and/or skin breakdown  2.  Assess vascular access sites hourly  3.  Every 4-6 hours minimum:  Change oxygen saturation probe site  4.  Every 4-6 hours:  If on nasal continuous positive airway pressure, respiratory therapy assess nares and determine need for appliance change or resting period.  Outcome: HH/HSPC Progressing     Problem: Safety - Adult  Goal: Free from fall injury  Outcome: HH/HSPC Progressing     Problem: ABCDS Injury Assessment  Goal: Absence of physical injury  Outcome: HH/HSPC Progressing     Problem: Chronic Conditions and Co-morbidities  Goal: Patient's chronic conditions and co-morbidity symptoms are monitored and maintained or improved  Outcome: HH/HSPC Progressing     Problem: Respiratory - Adult  Goal: Achieves optimal ventilation and oxygenation  Outcome: HH/HSPC Progressing

## 2021-06-25 LAB — BASIC METABOLIC PANEL
Anion Gap: 2 mmol/L — ABNORMAL LOW (ref 5–15)
BUN: 18 MG/DL (ref 6–20)
Bun/Cre Ratio: 15 (ref 12–20)
CO2: 36 mmol/L — ABNORMAL HIGH (ref 21–32)
Calcium: 9.1 MG/DL (ref 8.5–10.1)
Chloride: 99 mmol/L (ref 97–108)
Creatinine: 1.23 MG/DL — ABNORMAL HIGH (ref 0.55–1.02)
Est, Glom Filt Rate: 49 mL/min/{1.73_m2} — ABNORMAL LOW (ref >60)
Glucose: 111 mg/dL — ABNORMAL HIGH (ref 65–100)
Potassium: 4.2 mmol/L (ref 3.5–5.1)
Sodium: 137 mmol/L (ref 136–145)

## 2021-06-25 LAB — POCT GLUCOSE
POC Glucose: 166 mg/dL — ABNORMAL HIGH (ref 65–117)
POC Glucose: 114 mg/dL (ref 65–117)
POC Glucose: 129 mg/dL — ABNORMAL HIGH (ref 65–117)
POC Glucose: 171 mg/dL — ABNORMAL HIGH (ref 65–117)

## 2021-06-25 MED FILL — ENOXAPARIN SODIUM 40 MG/0.4ML IJ SOSY: 40 MG/0.4ML | INTRAMUSCULAR | Qty: 0.4

## 2021-06-25 MED FILL — GABAPENTIN 100 MG PO CAPS: 100 MG | ORAL | Qty: 1

## 2021-06-25 MED FILL — MIDODRINE HCL 5 MG PO TABS: 5 MG | ORAL | Qty: 2

## 2021-06-25 MED FILL — MEXILETINE HCL 150 MG PO CAPS: 150 MG | ORAL | Qty: 1

## 2021-06-25 MED FILL — ALBUTEIN 25 % IV SOLN: 25 % | INTRAVENOUS | Qty: 50

## 2021-06-25 MED FILL — CARAFATE 1 G PO TABS: 1 g | ORAL | Qty: 1

## 2021-06-25 MED FILL — BUMETANIDE 0.25 MG/ML IJ SOLN: 0.25 MG/ML | INTRAMUSCULAR | Qty: 10

## 2021-06-25 MED FILL — ASPIRIN 81 MG PO CHEW: 81 MG | ORAL | Qty: 1

## 2021-06-25 MED FILL — LEVOTHYROXINE SODIUM 150 MCG PO TABS: 150 MCG | ORAL | Qty: 1

## 2021-06-25 MED FILL — IPRATROPIUM-ALBUTEROL 0.5-2.5 (3) MG/3ML IN SOLN: RESPIRATORY_TRACT | Qty: 3

## 2021-06-25 MED FILL — CITALOPRAM HYDROBROMIDE 20 MG PO TABS: 20 MG | ORAL | Qty: 2

## 2021-06-25 MED FILL — BUDESONIDE 0.5 MG/2ML IN SUSP: 0.5 MG/2ML | RESPIRATORY_TRACT | Qty: 2

## 2021-06-25 MED FILL — ATORVASTATIN CALCIUM 10 MG PO TABS: 10 MG | ORAL | Qty: 1

## 2021-06-25 MED FILL — TRAZODONE HCL 100 MG PO TABS: 100 MG | ORAL | Qty: 1

## 2021-06-25 MED FILL — AMIODARONE HCL 200 MG PO TABS: 200 MG | ORAL | Qty: 1

## 2021-06-25 MED FILL — METOLAZONE 5 MG PO TABS: 5 MG | ORAL | Qty: 1

## 2021-06-25 MED FILL — POLYETHYLENE GLYCOL 3350 17 G PO PACK: 17 g | ORAL | Qty: 1

## 2021-06-25 NOTE — Care Coordination-Inpatient (Signed)
06/25/2021  10:29 AM  Care Management Progress Note      ICD-10-CM    1. Anasarca  R60.1       2. Dehydration  E86.0       3. Hypotension, unspecified hypotension type  I95.9       4. Acute on chronic heart failure, unspecified heart failure type (HCC)  I50.9       5. Acute on chronic heart failure with preserved ejection fraction (HFpEF) (HCC)  I50.33 CANCELED: Transthoracic echocardiogram (TTE) complete with contrast, bubble, strain, and 3D PRN     CANCELED: Transthoracic echocardiogram (TTE) complete with contrast, bubble, strain, and 3D PRN          RUR:  18%  Risk Level: [] Low [x] Moderate [] High  Value-based purchasing: []  Yes [x]  No  Bundle patient: []  Yes [x]  No   Specify:     Transition of care plan:  Awaiting medical clearance and DC order. Cards following.  Home with [facility] with [facility].  Outpatient follow-up.  Pt's family to transport.

## 2021-06-26 LAB — BASIC METABOLIC PANEL
Anion Gap: 1 mmol/L — ABNORMAL LOW (ref 5–15)
BUN: 21 MG/DL — ABNORMAL HIGH (ref 6–20)
Bun/Cre Ratio: 14 (ref 12–20)
CO2: 40 mmol/L — ABNORMAL HIGH (ref 21–32)
Calcium: 9.5 MG/DL (ref 8.5–10.1)
Chloride: 97 mmol/L (ref 97–108)
Creatinine: 1.54 MG/DL — ABNORMAL HIGH (ref 0.55–1.02)
Est, Glom Filt Rate: 37 mL/min/{1.73_m2} — ABNORMAL LOW (ref >60)
Glucose: 129 mg/dL — ABNORMAL HIGH (ref 65–100)
Potassium: 4.1 mmol/L (ref 3.5–5.1)
Sodium: 138 mmol/L (ref 136–145)

## 2021-06-26 LAB — CBC
Hematocrit: 29.6 % — ABNORMAL LOW (ref 35.0–47.0)
Hemoglobin: 8.8 g/dL — ABNORMAL LOW (ref 11.5–16.0)
MCH: 27.1 PG (ref 26.0–34.0)
MCHC: 29.7 g/dL — ABNORMAL LOW (ref 30.0–36.5)
MCV: 91.1 FL (ref 80.0–99.0)
MPV: 11.1 FL (ref 8.9–12.9)
Nucleated RBCs: 0 PER 100 WBC
Platelets: 73 10*3/uL — ABNORMAL LOW (ref 150–400)
RBC: 3.25 M/uL — ABNORMAL LOW (ref 3.80–5.20)
RDW: 15.9 % — ABNORMAL HIGH (ref 11.5–14.5)
WBC: 3.3 10*3/uL — ABNORMAL LOW (ref 3.6–11.0)
nRBC: 0 10*3/uL (ref 0.00–0.01)

## 2021-06-26 LAB — HEMOGLOBIN A1C
Hemoglobin A1C: 5.4 % (ref 4.0–5.6)
eAG: 108 mg/dL

## 2021-06-26 LAB — POCT GLUCOSE
POC Glucose: 166 mg/dL — ABNORMAL HIGH (ref 65–117)
POC Glucose: 120 mg/dL — ABNORMAL HIGH (ref 65–117)
POC Glucose: 173 mg/dL — ABNORMAL HIGH (ref 65–117)
POC Glucose: 102 mg/dL (ref 65–117)

## 2021-06-26 LAB — PHOSPHORUS: Phosphorus: 4.2 MG/DL (ref 2.6–4.7)

## 2021-06-26 LAB — MAGNESIUM: Magnesium: 1.9 mg/dL (ref 1.6–2.4)

## 2021-06-26 LAB — BRAIN NATRIURETIC PEPTIDE: NT Pro-BNP: 1941 PG/ML — ABNORMAL HIGH (ref <125)

## 2021-06-26 MED ORDER — ALIGN PO CAPS
Freq: Every day | ORAL | Status: DC
Start: 2021-06-26 — End: 2021-06-28
  Administered 2021-06-26 – 2021-06-28 (×3): 1 via ORAL

## 2021-06-26 MED ORDER — ACETAZOLAMIDE 250 MG PO TABS
250 MG | Freq: Two times a day (BID) | ORAL | Status: AC
Start: 2021-06-26 — End: 2021-06-27
  Administered 2021-06-26 – 2021-06-27 (×2): 250 mg via ORAL

## 2021-06-26 MED FILL — BUDESONIDE 0.5 MG/2ML IN SUSP: 0.5 MG/2ML | RESPIRATORY_TRACT | Qty: 2

## 2021-06-26 MED FILL — CARAFATE 1 G PO TABS: 1 g | ORAL | Qty: 1

## 2021-06-26 MED FILL — IPRATROPIUM-ALBUTEROL 0.5-2.5 (3) MG/3ML IN SOLN: RESPIRATORY_TRACT | Qty: 3

## 2021-06-26 MED FILL — GABAPENTIN 100 MG PO CAPS: 100 MG | ORAL | Qty: 1

## 2021-06-26 MED FILL — CITALOPRAM HYDROBROMIDE 20 MG PO TABS: 20 MG | ORAL | Qty: 2

## 2021-06-26 MED FILL — MEXILETINE HCL 150 MG PO CAPS: 150 MG | ORAL | Qty: 1

## 2021-06-26 MED FILL — METOLAZONE 5 MG PO TABS: 5 MG | ORAL | Qty: 1

## 2021-06-26 MED FILL — ATORVASTATIN CALCIUM 10 MG PO TABS: 10 MG | ORAL | Qty: 1

## 2021-06-26 MED FILL — ENOXAPARIN SODIUM 40 MG/0.4ML IJ SOSY: 40 MG/0.4ML | INTRAMUSCULAR | Qty: 0.4

## 2021-06-26 MED FILL — ACETAZOLAMIDE 250 MG PO TABS: 250 MG | ORAL | Qty: 1

## 2021-06-26 MED FILL — LEVOTHYROXINE SODIUM 150 MCG PO TABS: 150 MCG | ORAL | Qty: 1

## 2021-06-26 MED FILL — MIDODRINE HCL 5 MG PO TABS: 5 MG | ORAL | Qty: 2

## 2021-06-26 MED FILL — AMIODARONE HCL 200 MG PO TABS: 200 MG | ORAL | Qty: 1

## 2021-06-26 MED FILL — POLYETHYLENE GLYCOL 3350 17 G PO PACK: 17 g | ORAL | Qty: 1

## 2021-06-26 MED FILL — TRAZODONE HCL 100 MG PO TABS: 100 MG | ORAL | Qty: 1

## 2021-06-26 MED FILL — RISAQUAD PO CAPS: 8 billion cell | ORAL | Qty: 1

## 2021-06-26 MED FILL — ASPIRIN 81 MG PO CHEW: 81 MG | ORAL | Qty: 1

## 2021-06-26 MED FILL — BUMETANIDE 0.25 MG/ML IJ SOLN: 0.25 MG/ML | INTRAMUSCULAR | Qty: 10

## 2021-06-26 NOTE — Plan of Care (Signed)
Problem: Physical Therapy - Adult  Goal: By Discharge: Performs mobility at highest level of function for planned discharge setting.  See evaluation for individualized goals.  Description: FUNCTIONAL STATUS PRIOR TO ADMISSION: Patient was modified independent using a single point cane for functional mobility. and At baseline the patient was on 2 liters/min of supplemental O2 continuously.    HOME SUPPORT PRIOR TO ADMISSION: The patient lived with her son and daughter but did not require assistance.    Physical Therapy Goals  Initiated 06/26/2021  1.  Patient will move from supine to sit and sit to supine in bed with modified independence within 7 day(s).    2.  Patient will perform sit to stand with modified independence within 7 day(s).  3.  Patient will transfer from bed to chair and chair to bed with modified independence using the least restrictive device within 7 day(s).  4.  Patient will ambulate with modified independence for 100 feet with the least restrictive device within 7 day(s).   5.  Patient will ascend/descend 2 stairs with 1 handrail(s) with modified independence within 7 day(s).    Outcome: Progressing   PHYSICAL THERAPY EVALUATION    Patient: Deborah [name] (66 y.o. female)  Date: 06/26/2021  Primary Diagnosis: Dehydration [E86.0]  Anasarca [R60.1]  Hypotension, unspecified hypotension type [I95.9]  Acute on chronic heart failure, unspecified heart failure type (HCC) [I50.9]  Acute on chronic heart failure with preserved ejection fraction (HFpEF) (HCC) [I50.33]       Precautions:                      ASSESSMENT :   DEFICITS/IMPAIRMENTS:   The patient is limited by decreased functional mobility, activity tolerance, balance, increased pain levels     Based on the impairments listed above the patient presents mildly below her functional baseline following admission for CHF and DOE. 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 She uses 2L O2 continuously as  of her last admission. 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 During evaluation, she required CGA overall and gait training completed x15' using her SPC from home. Maintained on 2L via NC and SpO2 remained >94% without overt DOE. She was open to HHPT prior to admission and recommend resumption of care at discharge.    Patient will benefit from skilled intervention to address the above impairments.             PLAN :  Recommendations and Planned Interventions:   bed mobility training, transfer training, gait training, therapeutic exercises, and therapeutic activities    Frequency/Duration: Patient will be followed by physical therapy to address goals, PT Plan of Care: 2 times/week to address goals.    Recommendation for discharge: (in order for the patient to meet his/her long term goals): Therapy 2 days/week in the home    Other factors to consider for discharge: no additional factors    IF patient discharges home will need the following DME: patient owns DME required for discharge       SUBJECTIVE:   Patient stated "Therapy was still coming to the house."    OBJECTIVE DATA SUMMARY:       Past Medical History:   Diagnosis Date    (HFpEF) heart failure with preserved ejection fraction (HCC)     Anxiety and depression  Aortic valve replaced     S/p bovine aortic valve replacement.    Asthma     Chronic narcotic use     Chronic obstructive pulmonary disease (HCC)     Chronic pain     CKD (chronic kidney disease), stage III (HCC)     Baseline creatinine is 1.3-1.4 with GFR in the 40s.    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)     GERD (gastroesophageal reflux disease)     History of vascular access device 04/13/2021    4 FR Single PICC for LTABX: R cephalic vessell length 48 CM Max P leave @ 1 CM out; Arm circumferenc 40 CM    Hyperlipidemia     Hypothyroidism     Morbid obesity (HCC)     Neuropathy     Obstructive sleep  apnea     Rhinitis      Past Surgical History:   Procedure Laterality Date    AORTIC VALVE REPLACEMENT      Bovine bioprosthetic    COLONOSCOPY N/A 12/01/2020    COLONOSCOPY performed by Ian [facility]alkinLee, Alfred, [facility]    PACEMAKER         Home Situation:  Social/Functional History  Lives With: Son, Daughter  Type of Home: House  Home Layout: One level  Home Access: Stairs to enter with rails  Entrance Stairs - Number of Steps: 4  Bathroom Shower/Tub: Electrical engineerTub/Shower unit  Bathroom Toilet: Standard  Bathroom Equipment: Tub transfer bench  Home Equipment: Gilmer Morane, Oxygen, Environmental consultantWalker, standard, Wheelchair-manual (Pt has home O2 2L through [employer])  Receives Help From: Family  ADL Assistance: Independent  Ambulation Assistance: Needs assistance  Active Driver: No  Patient's Driver Info: Family    Cognitive/Behavioral Status:  Orientation  Orientation Level: Oriented X4       Skin:     Edema:     Hearing:        Vision/Perceptual:                  Strength:    Strength: Generally decreased, functional    Tone & Sensation:   Tone: Normal  Sensation: Impaired (BLE peripheral neuropathy)    Coordination:  Coordination: Generally decreased, functional    Range Of Motion:  AROM: Generally decreased, functional       Functional Mobility:  Bed Mobility:     Bed Mobility Training  Bed Mobility Training: Yes  Transfers:     Art therapistTransfer Training  Transfer Training: Yes  Interventions: Verbal cues  Sit to Stand: Contact-guard assistance  Balance:               Balance  Sitting: Intact  Standing: Impaired  Standing - Static: Fair  Standing - Dynamic: Fair  Ambulation/Gait Training:                       Gait  Overall Level of Assistance: Contact-guard assistance  Interventions: Visual cues  Base of Support: Widened  Speed/Cadence: Shuffled  Gait Abnormalities: Trunk sway increased  Distance (ft): 15 Feet  Assistive Device: Cane, quad  [employer] AM-PAC      Basic Mobility Inpatient Short Form (6-Clicks) Version 2  How much HELP from another person do you currently need... (If the patient hasn't done an activity recently, how much help from another person do you think they would need if they tried?) Total A Lot A Little None   1.  Turning from your back to your side while in a flat bed without using bedrails? []   1 []   2 []   3  [x]   4   2.  Moving from lying on your back to sitting on the side of a flat bed without using bedrails? []   1 []   2 []   3  [x]   4   3.  Moving to and from a bed to a chair (including a wheelchair)? []   1 []   2 [x]   3  []   4   4. Standing up from a chair using your arms (e.g. wheelchair or bedside chair)? []   1 []   2 [x]   3  []   4   5.  Walking in hospital room? []   1 []   2 [x]   3  []   4   6.  Climbing 3-5 steps with a railing? []   1 []   2 [x]   3  []   4     Raw Score: 20/24                            Cutoff score ?171,2,3 had higher odds of discharging home with home health or need of SNF/IPR.    1. , , Vinoth , Passek, . .  Validity of the AM-PAC "6-Clicks" Inpatient Daily Activity and Basic Mobility Short Forms. Physical Therapy Mar 2014, [phone]) 379-391; DOI: 10.2522/ptj.20130199  2. . Association of AM-PAC "6-Clicks" Basic Mobility and Daily Activity Scores With Discharge Destination. Phys Ther. 2021 Apr 4;101(4):pzab043. doi: 10.1093/ptj/pzab043. PMID: .  3. Herbold J, Rajaraman D, , Agayby K, Beacon S. Activity Measure for Post-Acute Care "6-Clicks" Basic Mobility Scores Predict Discharge Destination After Acute Care Hospitalization in Select Patient Groups: A Retrospective, Observational Study. Arch Rehabil Res Clin Transl. 2022 Jul  16;4(3):100204. doi: 10.1016/j.arrct. . PMID: ; PMCID .  4. , Coster W, Ni P. AM-PAC Short Forms Manual 4.0. Revised 02/2018.  Activity Tolerance:   Fair  and SpO2 stable on 2L O2 for completed intervention    After treatment:   Patient left in no apparent distress sitting up in chair and Call bell within reach    COMMUNICATION/EDUCATION:   The patient's plan of care was discussed with: registered nurse    Patient Education  Education Given To: Patient  Education Provided: Role of Arboriculturist  Education Method: Demonstration  Barriers to Learning: None  Education Outcome: Verbalized understanding    Thank you for this referral.  Cathie Olden, PT  Minutes: 23      Physical Therapy Evaluation Charge Determination   History Examination Presentation Decision-Making   MEDIUM  Complexity : 1-2 comorbidities / personal factors will impact the outcome/ POC  MEDIUM Complexity : 3 Standardized tests and measures addressin body structure, function, activity limitation and / or participation in recreation  LOW Complexity : Stable, uncomplicated  AM-PAC  LOW    Based on the above components, the patient evaluation is determined to be of the following complexity level: Low

## 2021-06-26 NOTE — Care Coordination-Inpatient (Signed)
06/26/2021 9:19 AM   Care Management Progress Note         ICD-10-CM     1. Anasarca  R60.1         2. Dehydration  E86.0         3. Hypotension, unspecified hypotension type  I95.9         4. Acute on chronic heart failure, unspecified heart failure type (HCC)  I50.9         5. Acute on chronic heart failure with preserved ejection fraction (HFpEF) (HCC)  I50.33 CANCELED: Transthoracic echocardiogram (TTE) complete with contrast, bubble, strain, and 3D PRN       CANCELED: Transthoracic echocardiogram (TTE) complete with contrast, bubble, strain, and 3D PRN             RUR:  18%  Risk Level: [] Low [x] Moderate [] High  Value-based purchasing: []  Yes [x]  No  Bundle patient: []  Yes [x]  No                    Readmission   Transition of care plan:  Awaiting medical clearance and DC order. Cards following.  Home with [facility] with [facility].  Outpatient follow-up.  Pt's family to transport.

## 2021-06-26 NOTE — Plan of Care (Signed)
Speech LAnguage Pathology EVALUATION    Patient: Deborah [name] (66 y.o. female)  Date: 06/26/2021  Primary Diagnosis: Dehydration [E86.0]  Anasarca [R60.1]  Hypotension, unspecified hypotension type [I95.9]  Acute on chronic heart failure, unspecified heart failure type (HCC) [I50.9]  Acute on chronic heart failure with preserved ejection fraction (HFpEF) (HCC) [I50.33]       Precautions: aspiration                    ASSESSMENT :  Based on the objective data described below, the patient presents with functional swallow. She is eating 100% of her trays, but c/o R throat food pocketing of new onset in the last 3 days.  Occasional throat clearing s/p liquids. Denies any recent h/o GERd.   Admitted with dehydration, HYPotention, anasarca, CHF  PMH: CHF, aortic valve replacement, COPD on 02,  CKD< obesity.  ESBL UTIs and strep B.  Spinal Ct:  C4-7 narrowing    Patient will benefit from skilled intervention to address the above impairments.     PLAN :  Recommendations and Planned Interventions:  Diet: Regular and thin liquids  Will monitor. Sounds more esophageal with solid residue.      Acute SLP Services: No, patient will be discharged from acute skilled speech-language pathology at this time.    Discharge Recommendations: To Be Determined     SUBJECTIVE:   Patient stated, "It's like I have a pocket righ here."    OBJECTIVE:     Past Medical History:   Diagnosis Date    (HFpEF) heart failure with preserved ejection fraction (HCC)     Anxiety and depression     Aortic valve replaced     S/p bovine aortic valve replacement.    Asthma     Chronic narcotic use     Chronic obstructive pulmonary disease (HCC)     Chronic pain     CKD (chronic kidney disease), stage III (HCC)     Baseline creatinine is 1.3-1.4 with GFR in the 40s.    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)     GERD (gastroesophageal reflux disease)     History of vascular access device 04/13/2021    4 FR Single PICC for LTABX: R cephalic vessell length 48 CM Max P  leave @ 1 CM out; Arm circumferenc 40 CM    Hyperlipidemia     Hypothyroidism     Morbid obesity (HCC)     Neuropathy     Obstructive sleep apnea     Rhinitis      Past Surgical History:   Procedure Laterality Date    AORTIC VALVE REPLACEMENT      Bovine bioprosthetic    COLONOSCOPY N/A 12/01/2020    COLONOSCOPY performed by Ian Malkin, MD at [facility] ENDOSCOPY    PACEMAKER       Prior Level of Function/Home Situation:   Social/Functional History  Lives With: Son, Daughter  Type of Home: House  Home Layout: One level  Home Access: Stairs to enter with rails  Entrance Stairs - Number of Steps: 4  Bathroom Shower/Tub: Medical sales representative: Standard  Bathroom Equipment: Tub transfer bench  Home Equipment: Gilmer Mor, Oxygen, Walker, standard, Wheelchair-manual (Pt has home O2 2L through [employer])  Receives Help From: Family  ADL Assistance: Independent  Ambulation Assistance: Needs assistance  Active Driver: No  Patient's Driver Info: Family    Baseline Assessment:  Current Diet : Regular  Current Liquid Diet : Thin  Prior  Dysphagia History: none  Patient Complaint: c/o sensation of food sticking on R side    Cognitive and Communication Status:  Neurologic State: Alert  Orientation Level: Oriented to person, Oriented to place, Oriented to situation, and Oriented to time  Cognition: Appropriate decision making and Follows commands    Dysphagia:  Oral Assessment:   WNL. Upper denture. No thrush  P.O. Trials:      Seen with cracker, liquids.   Oral phase:   Wnl  Pharyngeal phase:   Occasional throat clearing s/p liquids.   C/o food stuck but no struggle behaviors or attempt to cough and clear. Suspect either pill dysphagia with irritation in pharynx or esophagus or esophageal motility issues.         Motor Speech:   clear      Language Comprehension and Expression:         WNL          Neuro-Linguistics:                      Voice:                                    After treatment:   Nursing  notified    COMMUNICATION/EDUCATION:   Patient was educated regarding her deficit(s) of c/o throat pain and solid dysphagia  as this relates to her diagnosis of CHF.  She demonstrated Good understanding as evidenced by Verbalizing understanding.    The patient's plan of care including recommendations, planned interventions, and recommended diet changes were discussed with: Registered nurse    Patient/family have participated as able in goal setting and plan of care and Patient/family agree to work toward stated goals and plan of care    Thank you,  Crist Infante, SLP  Minutes: 15    Problem: SLP Adult - Impaired Swallowing  Goal: By Discharge: Advance to least restrictive diet without signs or symptoms of aspiration for planned discharge setting.  See evaluation for individualized goals.  Outcome: Progressing

## 2021-06-26 NOTE — Progress Notes (Addendum)
[place]  [place], [place] [address]  [phone]        Hospitalist Progress Note      NAME: Deborah [name]   DOB:  01-Feb-1955  MRM:  [id number]    Date/Time of service: 06/26/2021  11:50 AM       Subjective:     Chief Complaint:  Patient was personally seen and examined by me during this time period.  Chart reviewed.  Low BP today.  Edema is improving       Objective:       Vitals:       Last 24hrs VS reviewed since prior progress note. Most recent are:    Vitals:    06/26/21 0902   BP:    Pulse: 70   Resp: 18   Temp: 97.7 F (36.5 C)   SpO2: 96%     SpO2 Readings from Last 6 Encounters:   06/26/21 96%   06/16/21 98%          Intake/Output Summary (Last 24 hours) at 06/26/2021 1150  Last data filed at 06/26/2021 9528  Gross per 24 hour   Intake 440 ml   Output 3450 ml   Net -3010 ml        Exam:     Physical Exam:    Gen:  Well-developed, well-nourished, morbidly obese, in no acute distress  HEENT:  Pink conjunctivae, PERRL, hearing intact to voice, moist mucous membranes  Neck:  Supple, without masses, thyroid non-tender  Resp:  No accessory muscle use, clear breath sounds without wheezes rales or rhonchi  Card:  No murmurs, normal S1, S2 without thrills, anasarca   Abd:  Soft, non-tender, non-distended, normoactive bowel sounds are present  Musc:  No cyanosis or clubbing  Skin:  No rashes  Neuro:  Cranial nerves 3-12 are grossly intact, follows commands appropriately  Psych:  Good insight, oriented to person, place and time, alert    Medications Reviewed: (see below)    Lab Data Reviewed: (see below)    ______________________________________________________________________    Medications:     Current Facility-Administered Medications   Medication Dose Route Frequency    acidophilus probiotic capsule 1 capsule  1 capsule Oral Daily    metOLazone (ZAROXOLYN) tablet 5 mg  5 mg Oral Daily    mexiletine (MEXITIL) capsule 150 mg  150 mg Oral TID    midodrine (PROAMATINE)  tablet 10 mg  10 mg Oral TID    gabapentin (NEURONTIN) capsule 100 mg  100 mg Oral TID    oxyCODONE (ROXICODONE) immediate release tablet 5 mg  5 mg Oral Q6H PRN    sodium chloride flush 0.9 % injection 5-40 mL  5-40 mL IntraVENous 2 times per day    sodium chloride flush 0.9 % injection 5-40 mL  5-40 mL IntraVENous PRN    0.9 % sodium chloride infusion   IntraVENous PRN    enoxaparin (LOVENOX) injection 40 mg  40 mg SubCUTAneous BID    ondansetron (ZOFRAN-ODT) disintegrating tablet 4 mg  4 mg Oral Q8H PRN    Or    ondansetron (ZOFRAN) injection 4 mg  4 mg IntraVENous Q6H PRN    polyethylene glycol (GLYCOLAX) packet 17 g  17 g Oral Daily PRN    acetaminophen (TYLENOL) tablet 650 mg  650 mg Oral Q6H PRN    Or    acetaminophen (TYLENOL) suppository 650 mg  650 mg Rectal Q6H PRN    amiodarone (  CORDARONE) tablet 200 mg  200 mg Oral Daily    aspirin chewable tablet 81 mg  81 mg Oral Daily    atorvastatin (LIPITOR) tablet 10 mg  10 mg Oral Daily    budesonide (PULMICORT) nebulizer suspension 500 mcg  500 mcg Nebulization BID    [Held by provider] bumetanide (BUMEX) injection 1 mg  1 mg IntraVENous BID    citalopram (CELEXA) tablet 40 mg  40 mg Oral Daily    fluticasone (FLONASE) 50 MCG/ACT nasal spray 2 spray  2 spray Nasal Daily PRN    levothyroxine (SYNTHROID) tablet 150 mcg  150 mcg Oral QAM AC    polyethylene glycol (GLYCOLAX) packet 17 g  17 g Oral Daily    sucralfate (CARAFATE) tablet 1 g  1 g Oral TID    traZODone (DESYREL) tablet 100 mg  100 mg Oral Nightly    ipratropium 0.5 mg-albuterol 2.5 mg (DUONEB) nebulizer solution 1 ampule  1 ampule Inhalation Q6H WA    glucose chewable tablet 16 g  4 tablet Oral PRN    dextrose bolus 10% 125 mL  125 mL IntraVENous PRN    Or    dextrose bolus 10% 250 mL  250 mL IntraVENous PRN    glucagon injection 1 mg  1 mg SubCUTAneous PRN    dextrose 10 % infusion   IntraVENous Continuous PRN    insulin lispro (HUMALOG) injection vial 0-8 Units  0-8 Units SubCUTAneous TID WC     insulin lispro (HUMALOG) injection vial 0-4 Units  0-4 Units SubCUTAneous Nightly          Lab Review:     Recent Labs     06/26/21  0226   WBC 3.3*   HGB 8.8*   HCT 29.6*   PLT 73*     Recent Labs     06/24/21  0310 06/25/21  0214 06/26/21  0226   NA 138 137 138   K 4.5 4.2 4.1   CL 101 99 97   CO2 34* 36* 40*   BUN 18 18 21*   MG  --   --  1.9   PHOS  --   --  4.2     No results found for: GLUCPOC       Assessment / Plan:     66 yo hx of HTN, DM, dCHF, PVCs s/p pacer, bioprosthetic AVR, presented w/ dyspnea, hypoxia, CHF, AKI    1) Acute on chronic dCHF/anasarca: slowly improving, but still with significant edema.  Will hold IV bumex today due to AKI.  Will start diamox due to met alkalosis.  Cont midodrine for chronic hypotension.  Will monitor BMP, I/O    2) Acute on chronic resp failure/hypoxia/hypercapnea: now improving . Due to CHF, COPD.  On chronic 2L O2 at home.  Will monitor     3) AKI: worse today.  Will decrease diuretics.  Monitor BMP    4) COPD: stable.  Cont nebs prn, home O2    5) HTN/PVCs: cont amio    6) DM type 2 w/ vascular complications: J5K 0.9%.  cont SSI    7) Thrombocytopenia: unclear etiology.  Will check Fe panel, TSH, coags, fibrinogen.  Monitor CBC    **Prior records, notes, labs, radiology, and medications reviewed in [facility]**    Total time spent with patient care: 45 Minutes **I personally saw and examined the patient during this time period**  Care Plan discussed with: Patient, nursing, CM     Discussed:  Care Plan    Prophylaxis:  Lovenox    Disposition:  HH PT, OT, RN vs SNF           ___________________________________________________    Attending Physician: Pura Spice, MD

## 2021-06-26 NOTE — Progress Notes (Addendum)
OCCUPATIONAL THERAPY EVALUATION/DISCHARGE  Patient: Deborah [name] (66 y.o. female)  Date: 06/26/2021  Primary Diagnosis: Dehydration [E86.0]  Anasarca [R60.1]  Hypotension, unspecified hypotension type [I95.9]  Acute on chronic heart failure, unspecified heart failure type (HCC) [I50.9]  Acute on chronic heart failure with preserved ejection fraction (HFpEF) (HCC) [I50.33]         Precautions: Contact Precautions                  ASSESSMENT :  Based on the objective data below, the patient demonstrates near baseline ability to perform ADLs and transfers.   Patient using SPC and requires CGA for activity today.  Patient reports now improved breathing and edema.  Patient educated for continuing activity, up to chair for meals, commode transfers, etc and patient verbalized understanding.  Patient plans to discharge home with family when medically stable.     Further skilled acute occupational therapy is not indicated at this time.     PLAN :  Recommend with staff: OOB to chair, commod transfers, ADLs    Recommendation for discharge: (in order for the patient to meet his/her long term goals): No skilled occupational therapy    Other factors to consider for discharge: no additional factors    IF patient discharges home will need the following DME: none       SUBJECTIVE:   Patient stated, "Im doing ok."    OBJECTIVE DATA SUMMARY:     Past Medical History:   Diagnosis Date    (HFpEF) heart failure with preserved ejection fraction (HCC)     Anxiety and depression     Aortic valve replaced     S/p bovine aortic valve replacement.    Asthma     Chronic narcotic use     Chronic obstructive pulmonary disease (HCC)     Chronic pain     CKD (chronic kidney disease), stage III (HCC)     Baseline creatinine is 1.3-1.4 with GFR in the 40s.    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)     GERD (gastroesophageal reflux disease)     History of vascular access device 04/13/2021    4 FR Single PICC for LTABX: R cephalic vessell length 48 CM Max  P leave @ 1 CM out; Arm circumferenc 40 CM    Hyperlipidemia     Hypothyroidism     Morbid obesity (HCC)     Neuropathy     Obstructive sleep apnea     Rhinitis      Past Surgical History:   Procedure Laterality Date    AORTIC VALVE REPLACEMENT      Bovine bioprosthetic    COLONOSCOPY N/A 12/01/2020    COLONOSCOPY performed by Ian Malkin, MD at [facility] ENDOSCOPY    PACEMAKER         Prior Level of Function/Environment/Context: [place]  Receives Help From: Family, ADL Assistance: Independent,  ,  ,  ,  ,  ,  , Ambulation Assistance: Needs assistance,  , Active Driver: No     Expanded or extensive additional review of patient history:   Lives With: Son, Daughter  Type of Home: House  Home Layout: One level  Home Access: Stairs to enter with rails        Bathroom Shower/Tub: Medical sales representative: Standard  Bathroom Equipment: Tub transfer bench     Home Equipment: Cane, Oxygen, Walker, standard, Wheelchair-manual (Pt has home O2 2L through [employer])  Hand Dominance: right     EXAMINATION OF PERFORMANCE DEFICITS:    Cognitive/Behavioral Status:    Orientation  Orientation Level: Oriented X4       Skin: intact as seen    Edema: diffuse    Hearing:        Vision/Perceptual:                  Range of Motion:   AROM: Within functional limits         Strength:  Strength: Within functional limits      Coordination:  Coordination: Within functional limits     Coordination: Generally decreased, functional      Tone & Sensation:   Tone: Normal  Sensation: Intact    Balance:  Standing: Impaired  Balance  Sitting: Intact  Standing: Impaired  Standing - Static: Fair  Standing - Dynamic: Fair      Functional Mobility and Transfers for ADLs:  Bed Mobility:     Bed Mobility Training  Bed Mobility Training: Yes    Transfers:     Transfer Training  Transfer Training: Yes  Interventions: Verbal cues  Sit to Stand: Contact-guard assistance      ADL Assessment:          Feeding: Independent         Grooming: Contact guard  assistance  Grooming Skilled Clinical Factors: stand/sit    UE Bathing: Supervision       LE Bathing: Contact guard assistance       UE Dressing: Supervision       LE Dressing: Contact guard assistance       Toileting: Contact guard assistance                ADL Intervention and task modifications:                Pain Rating:  0/10   Pain Intervention(s):   pain is at a level acceptable to the patient    Activity Tolerance:   requires rest breaks    After treatment:   Patient left in no apparent distress sitting up in chair, Call bell within reach, and Bed/ chair alarm activated    COMMUNICATION/EDUCATION:   The patient's plan of care was discussed with: physical therapist and registered nurse         Thank you for this referral.  Gavriel Holzhauer C Praise Dolecki, OTR/L  Minutes: 20    Occupational Therapy Evaluation Charge Determination   History Examination Decision-Making   LOW Complexity : Brief history review  LOW Complexity: 1-3 Performance deficits relating to physical, cognitive, or psychosocial skills that result in activity limitations and/or participation restrictions LOW Complexity: No comorbidities that affect functional and  no verbal  or physical assist needed to complete eval tasks   Based on the above components, the patient evaluation is determined to be of the following complexity level: Low

## 2021-06-27 LAB — PROTIME-INR
INR: 1.1 (ref 0.9–1.1)
Protime: 11.4 s — ABNORMAL HIGH (ref 9.0–11.1)

## 2021-06-27 LAB — MAGNESIUM: Magnesium: 1.8 mg/dL (ref 1.6–2.4)

## 2021-06-27 LAB — POCT GLUCOSE
POC Glucose: 143 mg/dL — ABNORMAL HIGH (ref 65–117)
POC Glucose: 127 mg/dL — ABNORMAL HIGH (ref 65–117)
POC Glucose: 191 mg/dL — ABNORMAL HIGH (ref 65–117)
POC Glucose: 127 mg/dL — ABNORMAL HIGH (ref 65–117)

## 2021-06-27 LAB — COMPREHENSIVE METABOLIC PANEL
ALT: 11 U/L — ABNORMAL LOW (ref 12–78)
AST: 13 U/L — ABNORMAL LOW (ref 15–37)
Albumin/Globulin Ratio: 0.9 — ABNORMAL LOW (ref 1.1–2.2)
Albumin: 3.4 g/dL — ABNORMAL LOW (ref 3.5–5.0)
Alk Phosphatase: 65 U/L (ref 45–117)
Anion Gap: 2 mmol/L — ABNORMAL LOW (ref 5–15)
BUN: 20 MG/DL (ref 6–20)
Bun/Cre Ratio: 15 (ref 12–20)
CO2: 36 mmol/L — ABNORMAL HIGH (ref 21–32)
Calcium: 9.3 MG/DL (ref 8.5–10.1)
Chloride: 100 mmol/L (ref 97–108)
Creatinine: 1.35 MG/DL — ABNORMAL HIGH (ref 0.55–1.02)
Est, Glom Filt Rate: 44 mL/min/{1.73_m2} — ABNORMAL LOW (ref >60)
Globulin: 3.6 g/dL (ref 2.0–4.0)
Glucose: 111 mg/dL — ABNORMAL HIGH (ref 65–100)
Potassium: 3.8 mmol/L (ref 3.5–5.1)
Sodium: 138 mmol/L (ref 136–145)
Total Bilirubin: 2.3 MG/DL — ABNORMAL HIGH (ref 0.2–1.0)
Total Protein: 7 g/dL (ref 6.4–8.2)

## 2021-06-27 LAB — TSH: TSH, 3RD GENERATION: 3.07 u[IU]/mL (ref 0.36–3.74)

## 2021-06-27 LAB — CBC
Hematocrit: 31.5 % — ABNORMAL LOW (ref 35.0–47.0)
Hemoglobin: 9.4 g/dL — ABNORMAL LOW (ref 11.5–16.0)
MCH: 27.1 PG (ref 26.0–34.0)
MCHC: 29.8 g/dL — ABNORMAL LOW (ref 30.0–36.5)
MCV: 90.8 FL (ref 80.0–99.0)
MPV: 11.5 FL (ref 8.9–12.9)
Nucleated RBCs: 0 PER 100 WBC
Platelets: 78 10*3/uL — ABNORMAL LOW (ref 150–400)
RBC: 3.47 M/uL — ABNORMAL LOW (ref 3.80–5.20)
RDW: 15.9 % — ABNORMAL HIGH (ref 11.5–14.5)
WBC: 3.3 10*3/uL — ABNORMAL LOW (ref 3.6–11.0)
nRBC: 0 10*3/uL (ref 0.00–0.01)

## 2021-06-27 LAB — BRAIN NATRIURETIC PEPTIDE: NT Pro-BNP: 1228 PG/ML — ABNORMAL HIGH (ref <125)

## 2021-06-27 LAB — FERRITIN: Ferritin: 42 NG/ML (ref 26–388)

## 2021-06-27 LAB — PHOSPHORUS: Phosphorus: 3.9 MG/DL (ref 2.6–4.7)

## 2021-06-27 LAB — IRON AND TIBC
Iron % Saturation: 13 % — ABNORMAL LOW (ref 20–50)
Iron: 49 ug/dL (ref 35–150)
TIBC: 388 ug/dL (ref 250–450)

## 2021-06-27 LAB — FIBRINOGEN: Fibrinogen: 402 mg/dL (ref 200–475)

## 2021-06-27 LAB — FOLATE: Folate: 13.2 ng/mL (ref 5.0–21.0)

## 2021-06-27 LAB — VITAMIN B12: Vitamin B-12: 355 pg/mL (ref 193–986)

## 2021-06-27 MED ORDER — ACETAZOLAMIDE 250 MG PO TABS
250 MG | Freq: Two times a day (BID) | ORAL | Status: DC
Start: 2021-06-27 — End: 2021-06-28
  Administered 2021-06-27 – 2021-06-28 (×3): 250 mg via ORAL

## 2021-06-27 MED FILL — AMIODARONE HCL 200 MG PO TABS: 200 MG | ORAL | Qty: 1

## 2021-06-27 MED FILL — ENOXAPARIN SODIUM 40 MG/0.4ML IJ SOSY: 40 MG/0.4ML | INTRAMUSCULAR | Qty: 0.4

## 2021-06-27 MED FILL — GABAPENTIN 100 MG PO CAPS: 100 MG | ORAL | Qty: 1

## 2021-06-27 MED FILL — BUDESONIDE 0.5 MG/2ML IN SUSP: 0.5 MG/2ML | RESPIRATORY_TRACT | Qty: 2

## 2021-06-27 MED FILL — RISAQUAD PO CAPS: 8 billion cell | ORAL | Qty: 1

## 2021-06-27 MED FILL — LEVOTHYROXINE SODIUM 150 MCG PO TABS: 150 MCG | ORAL | Qty: 1

## 2021-06-27 MED FILL — MEXILETINE HCL 150 MG PO CAPS: 150 MG | ORAL | Qty: 1

## 2021-06-27 MED FILL — ACETAZOLAMIDE 250 MG PO TABS: 250 MG | ORAL | Qty: 1

## 2021-06-27 MED FILL — CARAFATE 1 G PO TABS: 1 g | ORAL | Qty: 1

## 2021-06-27 MED FILL — TRAZODONE HCL 100 MG PO TABS: 100 MG | ORAL | Qty: 1

## 2021-06-27 MED FILL — IPRATROPIUM-ALBUTEROL 0.5-2.5 (3) MG/3ML IN SOLN: RESPIRATORY_TRACT | Qty: 3

## 2021-06-27 MED FILL — MIDODRINE HCL 5 MG PO TABS: 5 MG | ORAL | Qty: 2

## 2021-06-27 MED FILL — ASPIRIN 81 MG PO CHEW: 81 MG | ORAL | Qty: 1

## 2021-06-27 MED FILL — CITALOPRAM HYDROBROMIDE 20 MG PO TABS: 20 MG | ORAL | Qty: 2

## 2021-06-27 MED FILL — ATORVASTATIN CALCIUM 10 MG PO TABS: 10 MG | ORAL | Qty: 1

## 2021-06-27 MED FILL — POLYETHYLENE GLYCOL 3350 17 G PO PACK: 17 g | ORAL | Qty: 1

## 2021-06-27 NOTE — Plan of Care (Signed)
Problem: Discharge Planning  Goal: Discharge to home or other facility with appropriate resources  Outcome: Progressing     Problem: Pain  Goal: Verbalizes/displays adequate comfort level or baseline comfort level  Outcome: Progressing     Problem: Skin/Tissue Integrity  Goal: Absence of new skin breakdown  Description: 1.  Monitor for areas of redness and/or skin breakdown  2.  Assess vascular access sites hourly  3.  Every 4-6 hours minimum:  Change oxygen saturation probe site  4.  Every 4-6 hours:  If on nasal continuous positive airway pressure, respiratory therapy assess nares and determine need for appliance change or resting period.  Outcome: Progressing     Problem: Safety - Adult  Goal: Free from fall injury  Outcome: Progressing     Problem: ABCDS Injury Assessment  Goal: Absence of physical injury  Outcome: Progressing     Problem: Chronic Conditions and Co-morbidities  Goal: Patient's chronic conditions and co-morbidity symptoms are monitored and maintained or improved  Outcome: Progressing     Problem: Respiratory - Adult  Goal: Achieves optimal ventilation and oxygenation  06/27/2021 1059 by Carlisle Beers, RN  Outcome: Progressing  06/27/2021 0826 by Ancil Boozer, RT  Outcome: Progressing  Flowsheets (Taken 06/27/2021 0054 by Starla Link, RN)  Achieves optimal ventilation and oxygenation: (Cpap started ventilation) Respiratory therapy support as indicated  06/27/2021 0037 by Egbert Garibaldi, RT  Outcome: Progressing  Flowsheets (Taken 06/27/2021 0037)  Achieves optimal ventilation and oxygenation: Assess for changes in respiratory status  Goal: Adequate oxygenation  Description: Adequate oxygenation  06/27/2021 0826 by Ancil Boozer, RT  Outcome: Progressing

## 2021-06-27 NOTE — Plan of Care (Signed)
Problem: Physical Therapy - Adult  Goal: By Discharge: Performs mobility at highest level of function for planned discharge setting.  See evaluation for individualized goals.  Description: FUNCTIONAL STATUS PRIOR TO ADMISSION: Patient was modified independent using a single point cane for functional mobility. and At baseline the patient was on 2 liters/min of supplemental O2 continuously.    HOME SUPPORT PRIOR TO ADMISSION: The patient lived with her son and daughter but did not require assistance.    Physical Therapy Goals  Initiated 06/26/2021  1.  Patient will move from supine to sit and sit to supine in bed with modified independence within 7 day(s).    2.  Patient will perform sit to stand with modified independence within 7 day(s).  3.  Patient will transfer from bed to chair and chair to bed with modified independence using the least restrictive device within 7 day(s).  4.  Patient will ambulate with modified independence for 100 feet with the least restrictive device within 7 day(s).   5.  Patient will ascend/descend 2 stairs with 1 handrail(s) with modified independence within 7 day(s).    Outcome: Progressing   PHYSICAL THERAPY TREATMENT    Patient: Deborah [name] (66 y.o. female)  Date: 06/27/2021  Diagnosis: Dehydration [E86.0]  Anasarca [R60.1]  Hypotension, unspecified hypotension type [I95.9]  Acute on chronic heart failure, unspecified heart failure type (HCC) [I50.9]  Acute on chronic heart failure with preserved ejection fraction (HFpEF) (HCC) [I50.33] Anasarca      Precautions: Contact Precautions                    ASSESSMENT:  Patient continues to benefit from skilled PT services and is slowly progressing towards goals. Pt requires CGA for functional mobility tasks using SPR. Demonstrates increased trunk sway with reports of increased B knee pain after 40' gait training on level surfaces, no overt LOB. Pt denies increased SOB with using 2L O2 throughout activity.          PLAN:  Patient continues  to benefit from skilled intervention to address the above impairments.  Continue treatment per established plan of care.    Recommendation for discharge: (in order for the patient to meet his/her long term goals): Therapy 2 days/week in the home    Other factors to consider for discharge: no additional factors    IF patient discharges home will need the following DME: patient owns DME required for discharge       SUBJECTIVE:   Patient stated, "doing ok."    OBJECTIVE DATA SUMMARY:   Critical Behavior:          Functional Mobility Training:  Bed Mobility:     Transfers:  Transfer Training  Interventions: Verbal cues  Sit to Stand: Stand-by assistance  Stand to Sit: Stand-by assistance  Balance:  Balance  Sitting: Intact  Standing: Impaired  Standing - Static: Constant support;Fair  Standing - Dynamic: Constant support;Fair   Ambulation/Gait Training:     Gait  Speed/Cadence: Pace decreased (< 100 feet/min)  Gait Abnormalities: Trunk sway increased  Distance (ft): 40 Feet  Assistive Device: Cane, straight  Neuro Re-Education:                    Pain Rating:  0/10  at rest 5/10 B knee pain with gait training  Pain Intervention(s):   nursing notified    Activity Tolerance:   Good    After treatment:   Patient left in no apparent distress sitting  up in chair and Call bell within reach      COMMUNICATION/EDUCATION:   The patient's plan of care was discussed with: registered nurse    Patient Education  Education Given To: Patient  Education Provided: Home Exercise Program;Role of Therapy  Education Method: Demonstration  Barriers to Learning: None  Education Outcome: Verbalized understanding      Regan Lemming, PTA  Minutes: 18

## 2021-06-27 NOTE — Care Coordination-Inpatient (Addendum)
06/27/2021 1:29 PM   Care Management Progress Note         ICD-10-CM     1. Anasarca  R60.1         2. Dehydration  E86.0         3. Hypotension, unspecified hypotension type  I95.9         4. Acute on chronic heart failure, unspecified heart failure type (HCC)  I50.9         5. Acute on chronic heart failure with preserved ejection fraction (HFpEF) (HCC)  I50.33 CANCELED: Transthoracic echocardiogram (TTE) complete with contrast, bubble, strain, and 3D PRN       CANCELED: Transthoracic echocardiogram (TTE) complete with contrast, bubble, strain, and 3D PRN             RUR:  19%  Risk Level: [] Low [x] Moderate [] High  Value-based purchasing: []  Yes [x]  No  Bundle patient: []  Yes [x]  No                    Readmission   Transition of care plan:  Awaiting medical clearance and DC order.  Home with [facility] with [facility].  Outpatient follow-up.  Pt's family to transport.

## 2021-06-27 NOTE — Plan of Care (Signed)
Speech LAnguage Pathology TREATMENT    Patient: Deborah [name] (66 y.o. female)  Date: 06/27/2021  Primary Diagnosis: Dehydration [E86.0]  Anasarca [R60.1]  Hypotension, unspecified hypotension type [I95.9]  Acute on chronic heart failure, unspecified heart failure type (HCC) [I50.9]  Acute on chronic heart failure with preserved ejection fraction (HFpEF) (HCC) [I50.33]       Precautions: aspiration Contact Precautions                  ASSESSMENT :  Based on the objective data described below, the patient presents with functional swallow at meals with no s/s aspiration.   She continued to reports c/o R sided pharyngeal residue, but it was not overwhelming her ability to eat.     Patient will benefit from skilled intervention to address the above impairments.     PLAN :  Recommendations and Planned Interventions:  Diet: Regular and thin liquids       Acute SLP Services: yes    Discharge Recommendations: To Be Determined-f/u with MD in a week if still feeling globus sensation     SUBJECTIVE:   Patient stated, "It's still there."    OBJECTIVE:     Past Medical History:   Diagnosis Date    (HFpEF) heart failure with preserved ejection fraction (HCC)     Anxiety and depression     Aortic valve replaced     S/p bovine aortic valve replacement.    Asthma     Chronic narcotic use     Chronic obstructive pulmonary disease (HCC)     Chronic pain     CKD (chronic kidney disease), stage III (HCC)     Baseline creatinine is 1.3-1.4 with GFR in the 40s.    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)     GERD (gastroesophageal reflux disease)     History of vascular access device 04/13/2021    4 FR Single PICC for LTABX: R cephalic vessell length 48 CM Max P leave @ 1 CM out; Arm circumferenc 40 CM    Hyperlipidemia     Hypothyroidism     Morbid obesity (HCC)     Neuropathy     Obstructive sleep apnea     Rhinitis      Past Surgical History:   Procedure Laterality Date    AORTIC VALVE REPLACEMENT      Bovine bioprosthetic    COLONOSCOPY N/A  12/01/2020    COLONOSCOPY performed by Ian Malkin, MD at [facility] ENDOSCOPY    PACEMAKER       Prior Level of Function/Home Situation:   Social/Functional History  Lives With: Son, Daughter  Type of Home: House  Home Layout: One level  Home Access: Stairs to enter with rails  Entrance Stairs - Number of Steps: 4  Bathroom Shower/Tub: Medical sales representative: Midwife: Tub transfer bench  Home Equipment: Gilmer Mor, Oxygen, Walker, standard, Wheelchair-manual (Pt has home O2 2L through [employer])  Receives Help From: Family  ADL Assistance: Independent  Ambulation Assistance: Needs assistance  Active Driver: No  Patient's Driver Info: Family    Baseline Assessment:                Cognitive and Communication Status:  Neurologic State: Alert  Orientation Level: Oriented x4  Cognition: Appropriate decision making and Follows commands    Dysphagia:  Oral Assessment:     P.O. Trials:  PO Trials  Assessment Method(s): Observation;Palpation  Patient Position: upright in bed  Vocal Quality:  No Impairment  Consistency Presented: Regular;Thin (seen at lunch)  How Presented: Self-fed/presented;Spoon;Straw;Successive Swallows  Bolus Acceptance: No impairment  Bolus Formation/Control: No impairment  Propulsion: No impairment  Oral Residue: None  Initiation of Swallow: No impairment  Laryngeal Elevation: Functional  Aspiration Signs/Symptoms: None  Pharyngeal Phase Characteristics: No impairment, issues, or problems            Motor Speech:   WNL      Language Comprehension and Expression:      WNL             Neuro-Linguistics:                      Voice:       Respiratory Status/Airway:  Room air                                 After treatment:   Patient left in no apparent distress sitting up in chair    COMMUNICATION/EDUCATION:   Patient was educated regarding her deficit(s) of c/o globus sensation as this relates to her diagnosis of CHF.  She demonstrated Good understanding as evidenced by Verbalizing  understanding.    The patient's plan of care including recommendations, planned interventions, and recommended diet changes were discussed with:  none    Patient/family have participated as able in goal setting and plan of care and Patient/family agree to work toward stated goals and plan of care    Thank you,  Crist Infante, SLP  Minutes: 15    Problem: SLP Adult - Impaired Swallowing  Goal: By Discharge: Advance to least restrictive diet without signs or symptoms of aspiration for planned discharge setting.  See evaluation for individualized goals.  Outcome: Progressing

## 2021-06-27 NOTE — Progress Notes (Signed)
[facility]  [address] [place], [place] [address]  [phone]        Hospitalist Progress Note      NAME: Deborah [name]   DOB:  1955/03/05  MRM:  [id number]    Date/Time of service: 06/27/2021  11:00 AM       Subjective:     Chief Complaint:  Patient was personally seen and examined by me during this time period.  Chart reviewed.  Responded well to diamox.  Updated son on phone        Objective:       Vitals:       Last 24hrs VS reviewed since prior progress note. Most recent are:    Vitals:    06/27/21 0830   BP:    Pulse: 64   Resp:    Temp:    SpO2:      SpO2 Readings from Last 6 Encounters:   06/27/21 95%   06/16/21 98%          Intake/Output Summary (Last 24 hours) at 06/27/2021 1100  Last data filed at 06/27/2021 0604  Gross per 24 hour   Intake 400 ml   Output 2300 ml   Net -1900 ml          Exam:     Physical Exam:    Gen:  Well-developed, well-nourished, morbidly obese, in no acute distress  HEENT:  Pink conjunctivae, PERRL, hearing intact to voice, moist mucous membranes  Neck:  Supple, without masses, thyroid non-tender  Resp:  No accessory muscle use, clear breath sounds without wheezes rales or rhonchi  Card:  No murmurs, normal S1, S2 without thrills, + anasarca   Abd:  Soft, non-tender, non-distended, normoactive bowel sounds are present  Musc:  No cyanosis or clubbing  Skin:  No rashes  Neuro:  Cranial nerves 3-12 are grossly intact, follows commands appropriately  Psych:  Good insight, oriented to person, place and time, alert    Medications Reviewed: (see below)    Lab Data Reviewed: (see below)    ______________________________________________________________________    Medications:     Current Facility-Administered Medications   Medication Dose Route Frequency    acetaZOLAMIDE (DIAMOX) tablet 250 mg  250 mg Oral Q12H    acidophilus probiotic capsule 1 capsule  1 capsule Oral Daily    [Held by provider] metOLazone (ZAROXOLYN) tablet 5 mg  5 mg Oral Daily     mexiletine (MEXITIL) capsule 150 mg  150 mg Oral TID    midodrine (PROAMATINE) tablet 10 mg  10 mg Oral TID    gabapentin (NEURONTIN) capsule 100 mg  100 mg Oral TID    oxyCODONE (ROXICODONE) immediate release tablet 5 mg  5 mg Oral Q6H PRN    sodium chloride flush 0.9 % injection 5-40 mL  5-40 mL IntraVENous 2 times per day    sodium chloride flush 0.9 % injection 5-40 mL  5-40 mL IntraVENous PRN    0.9 % sodium chloride infusion   IntraVENous PRN    enoxaparin (LOVENOX) injection 40 mg  40 mg SubCUTAneous BID    ondansetron (ZOFRAN-ODT) disintegrating tablet 4 mg  4 mg Oral Q8H PRN    Or    ondansetron (ZOFRAN) injection 4 mg  4 mg IntraVENous Q6H PRN    polyethylene glycol (GLYCOLAX) packet 17 g  17 g Oral Daily PRN    acetaminophen (TYLENOL) tablet 650 mg  650 mg Oral Q6H PRN  Or    acetaminophen (TYLENOL) suppository 650 mg  650 mg Rectal Q6H PRN    amiodarone (CORDARONE) tablet 200 mg  200 mg Oral Daily    aspirin chewable tablet 81 mg  81 mg Oral Daily    atorvastatin (LIPITOR) tablet 10 mg  10 mg Oral Daily    budesonide (PULMICORT) nebulizer suspension 500 mcg  500 mcg Nebulization BID    [Held by provider] bumetanide (BUMEX) injection 1 mg  1 mg IntraVENous BID    citalopram (CELEXA) tablet 40 mg  40 mg Oral Daily    fluticasone (FLONASE) 50 MCG/ACT nasal spray 2 spray  2 spray Nasal Daily PRN    levothyroxine (SYNTHROID) tablet 150 mcg  150 mcg Oral QAM AC    polyethylene glycol (GLYCOLAX) packet 17 g  17 g Oral Daily    sucralfate (CARAFATE) tablet 1 g  1 g Oral TID    traZODone (DESYREL) tablet 100 mg  100 mg Oral Nightly    ipratropium 0.5 mg-albuterol 2.5 mg (DUONEB) nebulizer solution 1 ampule  1 ampule Inhalation Q6H WA    glucose chewable tablet 16 g  4 tablet Oral PRN    dextrose bolus 10% 125 mL  125 mL IntraVENous PRN    Or    dextrose bolus 10% 250 mL  250 mL IntraVENous PRN    glucagon injection 1 mg  1 mg SubCUTAneous PRN    dextrose 10 % infusion   IntraVENous Continuous PRN    insulin  lispro (HUMALOG) injection vial 0-8 Units  0-8 Units SubCUTAneous TID WC    insulin lispro (HUMALOG) injection vial 0-4 Units  0-4 Units SubCUTAneous Nightly          Lab Review:     Recent Labs     06/26/21  0226 06/27/21  0355   WBC 3.3* 3.3*   HGB 8.8* 9.4*   HCT 29.6* 31.5*   PLT 73* 78*       Recent Labs     06/25/21  0214 06/26/21  0226 06/27/21  0355   NA 137 138 138   K 4.2 4.1 3.8   CL 99 97 100   CO2 36* 40* 36*   BUN 18 21* 20   MG  --  1.9 1.8   PHOS  --  4.2 3.9   ALT  --   --  11*   INR  --   --  1.1       No results found for: GLUCPOC       Assessment / Plan:     66 yo hx of HTN, DM, dCHF, PVCs s/p pacer, bioprosthetic AVR, presented w/ dyspnea, hypoxia, CHF, AKI    1) Acute on chronic dCHF/anasarca: slowly improving, but still with significant edema.  Responding well to diamox.  Will hold IV bumex.  Cont midodrine for chronic hypotension.  Will monitor BMP, I/O    2) Acute on chronic resp failure/hypoxia/hypercapnea: now improving . Due to CHF, COPD.  On chronic 2L O2 at home.  Will monitor     3) AKI: Cr stable with diuretics.  Monitor BMP    4) COPD: stable.  Cont nebs prn, home O2    5) HTN/PVCs: cont amio    6) DM type 2 w/ vascular complications: A1C 5.7%.  cont SSI    7) Thrombocytopenia: slowly improving, likely from acute illness.  Not consistent with hemolysis.  Will monitor iron studies, CBC     **Prior records, notes,  labs, radiology, and medications reviewed in [facility]**    Total time spent with patient care: 40 Minutes **I personally saw and examined the patient during this time period**                 Care Plan discussed with: Patient, nursing, CM     Discussed:  Care Plan    Prophylaxis:  Lovenox    Disposition:  HH PT, OT, RN vs SNF           ___________________________________________________    Attending Physician: Paulo FruitKhoi Simisola Sandles, MD

## 2021-06-28 LAB — POCT GLUCOSE
POC Glucose: 139 mg/dL — ABNORMAL HIGH (ref 65–117)
POC Glucose: 113 mg/dL (ref 65–117)
POC Glucose: 216 mg/dL — ABNORMAL HIGH (ref 65–117)

## 2021-06-28 LAB — BASIC METABOLIC PANEL
Anion Gap: 2 mmol/L — ABNORMAL LOW (ref 5–15)
BUN: 19 MG/DL (ref 6–20)
Bun/Cre Ratio: 14 (ref 12–20)
CO2: 33 mmol/L — ABNORMAL HIGH (ref 21–32)
Calcium: 9.3 MG/DL (ref 8.5–10.1)
Chloride: 104 mmol/L (ref 97–108)
Creatinine: 1.33 MG/DL — ABNORMAL HIGH (ref 0.55–1.02)
Est, Glom Filt Rate: 45 mL/min/{1.73_m2} — ABNORMAL LOW (ref >60)
Glucose: 114 mg/dL — ABNORMAL HIGH (ref 65–100)
Potassium: 4 mmol/L (ref 3.5–5.1)
Sodium: 139 mmol/L (ref 136–145)

## 2021-06-28 LAB — MAGNESIUM: Magnesium: 1.9 mg/dL (ref 1.6–2.4)

## 2021-06-28 LAB — PHOSPHORUS: Phosphorus: 4.3 MG/DL (ref 2.6–4.7)

## 2021-06-28 MED ORDER — POTASSIUM CHLORIDE CRYS ER 20 MEQ PO TBCR
20 MEQ | ORAL_TABLET | Freq: Every day | ORAL | 1 refills | Status: AC
Start: 2021-06-28 — End: ?

## 2021-06-28 MED ORDER — ALIGN/RISAQUAD PO CAPS
ORAL_CAPSULE | Freq: Every day | ORAL | 1 refills | Status: AC
Start: 2021-06-28 — End: ?

## 2021-06-28 MED ORDER — GABAPENTIN 100 MG PO CAPS
100 MG | ORAL_CAPSULE | Freq: Three times a day (TID) | ORAL | 1 refills | Status: AC
Start: 2021-06-28 — End: 2021-08-27

## 2021-06-28 MED ORDER — MIDODRINE HCL 10 MG PO TABS
10 MG | ORAL_TABLET | Freq: Three times a day (TID) | ORAL | 1 refills | Status: AC
Start: 2021-06-28 — End: ?

## 2021-06-28 MED ORDER — BUMETANIDE 1 MG PO TABS
1 MG | ORAL_TABLET | Freq: Two times a day (BID) | ORAL | 1 refills | Status: DC
Start: 2021-06-28 — End: 2021-10-05

## 2021-06-28 MED FILL — HUMALOG 100 UNIT/ML IJ SOLN: 100 UNIT/ML | INTRAMUSCULAR | Qty: 1

## 2021-06-28 MED FILL — ATORVASTATIN CALCIUM 10 MG PO TABS: 10 MG | ORAL | Qty: 1

## 2021-06-28 MED FILL — RISAQUAD PO CAPS: ORAL | Qty: 1

## 2021-06-28 MED FILL — ACETAZOLAMIDE 250 MG PO TABS: 250 MG | ORAL | Qty: 1

## 2021-06-28 MED FILL — ASPIRIN 81 MG PO CHEW: 81 MG | ORAL | Qty: 1

## 2021-06-28 MED FILL — ENOXAPARIN SODIUM 40 MG/0.4ML IJ SOSY: 40 MG/0.4ML | INTRAMUSCULAR | Qty: 0.4

## 2021-06-28 MED FILL — GABAPENTIN 100 MG PO CAPS: 100 MG | ORAL | Qty: 1

## 2021-06-28 MED FILL — MIDODRINE HCL 5 MG PO TABS: 5 MG | ORAL | Qty: 2

## 2021-06-28 MED FILL — LEVOTHYROXINE SODIUM 150 MCG PO TABS: 150 MCG | ORAL | Qty: 1

## 2021-06-28 MED FILL — CARAFATE 1 G PO TABS: 1 g | ORAL | Qty: 1

## 2021-06-28 MED FILL — IPRATROPIUM-ALBUTEROL 0.5-2.5 (3) MG/3ML IN SOLN: RESPIRATORY_TRACT | Qty: 3

## 2021-06-28 MED FILL — MEXILETINE HCL 150 MG PO CAPS: 150 MG | ORAL | Qty: 1

## 2021-06-28 MED FILL — TRAZODONE HCL 100 MG PO TABS: 100 MG | ORAL | Qty: 1

## 2021-06-28 MED FILL — CITALOPRAM HYDROBROMIDE 20 MG PO TABS: 20 MG | ORAL | Qty: 2

## 2021-06-28 MED FILL — BUDESONIDE 0.5 MG/2ML IN SUSP: 0.5 MG/2ML | RESPIRATORY_TRACT | Qty: 2

## 2021-06-28 MED FILL — AMIODARONE HCL 200 MG PO TABS: 200 MG | ORAL | Qty: 1

## 2021-06-28 MED FILL — POLYETHYLENE GLYCOL 3350 17 G PO PACK: 17 g | ORAL | Qty: 1

## 2021-06-28 NOTE — Care Coordination-Inpatient (Signed)
06/28/2021 11:59 AM Pt to discharge home today.   CM sent [facility] pt's discharge clinicals and notified of pt's discharge home today via [employer].   Fredric Mare, BSW      06/28/21 1201   Discharge Planning   Patient expects to be discharged to: [facility] At/After Discharge   Transition of Care Consult (CM Consult) Home Health   Internal Home Health No   Reason Outside Agency Chosen Patient already serviced by other home care/hospice agency   Services At/After Discharge Home Health

## 2021-06-28 NOTE — Other (Signed)
Met with patient prior to discharge.  Patient feeling much improved with much less edema.  Encouraged patient to stay active within symptom limitations.  Reviewed medication changes.  Reviewed importance of early follow up with providers.  Patient aware of upcoming appointments.  Patient to resume [facility] services.   Patient has charitable scale at bedside to take home.  Reviewed weight gain parameters and notification of Cardiology of weight gain.  Patient given my contact information and encouraged to call for questions or concerns since her cell service is not great and I could not reach her after last discharge.

## 2021-06-28 NOTE — Plan of Care (Signed)
Problem: Discharge Planning  Goal: Discharge to home or other facility with appropriate resources  Outcome: Adequate for Discharge     Problem: Pain  Goal: Verbalizes/displays adequate comfort level or baseline comfort level  Outcome: Adequate for Discharge     Problem: Skin/Tissue Integrity  Goal: Absence of new skin breakdown  Description: 1.  Monitor for areas of redness and/or skin breakdown  2.  Assess vascular access sites hourly  3.  Every 4-6 hours minimum:  Change oxygen saturation probe site  4.  Every 4-6 hours:  If on nasal continuous positive airway pressure, respiratory therapy assess nares and determine need for appliance change or resting period.  Outcome: Adequate for Discharge     Problem: Safety - Adult  Goal: Free from fall injury  Outcome: Adequate for Discharge     Problem: ABCDS Injury Assessment  Goal: Absence of physical injury  Outcome: Adequate for Discharge     Problem: Chronic Conditions and Co-morbidities  Goal: Patient's chronic conditions and co-morbidity symptoms are monitored and maintained or improved  Outcome: Adequate for Discharge     Problem: Respiratory - Adult  Goal: Achieves optimal ventilation and oxygenation  06/28/2021 1337 by Donah Driver, RN  Outcome: Adequate for Discharge  06/28/2021 1132 by Ancil Boozer, RT  Outcome: Progressing

## 2021-06-28 NOTE — Discharge Summary (Signed)
[facility]  [address], [place], [place] [id number]  [phone]          Hospitalist Discharge Summary     Patient ID:  Deborah LoftDonna [name]  [id number]  66 y.o.  09-27-1955    Admit date: 06/20/2021    Discharge date and time: 06/28/2021 10:44 AM    Admission Diagnoses: Dehydration [E86.0]  Anasarca [R60.1]  Hypotension, unspecified hypotension type [I95.9]  Acute on chronic heart failure, unspecified heart failure type (HCC) [I50.9]  Acute on chronic heart failure with preserved ejection fraction (HFpEF) (HCC) [I50.33]    Discharge Diagnoses:  Principal Diagnosis Anasarca                                            Principal Problem:    Anasarca  Active Problems:    Acute on chronic diastolic (congestive) heart failure (HCC)  Resolved Problems:    * No resolved hospital problems. *         Hospital Course:     66 yo hx of HTN, DM, dCHF, PVCs s/p pacer, bioprosthetic AVR, presented w/ dyspnea, hypoxia, CHF, AKI     1) Acute on chronic dCHF/anasarca: much improved.  Responded well to diamox and IV bumex.  Patient had 15L of fluid removed.  Cont midodrine for chronic hypotension.  Stopped florinef due to fluid retention     2) Acute on chronic resp failure/hypoxia/hypercapnea: now improving . Due to CHF, COPD.  On chronic 2L O2 at home.  Will monitor      3) AKI: Cr stable with diuretics.  Monitor BMP prn     4) COPD: stable.  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  cont SSI     7) Thrombocytopenia: slowly improving, likely from acute illness.  Not consistent with hemolysis.  Iron studies, B12/folate wnl     PCP: Tanya Nonesita M Barrett, APRN - NP     Consults: Cards    Significant Diagnostic Studies: none    Discharge Exam:  Physical Exam:    Gen:  Well-developed, well-nourished, morbidly obese, in no acute distress  HEENT:  Pink conjunctivae, PERRL, hearing intact to voice, moist mucous membranes  Neck:  Supple, without masses, thyroid  non-tender  Resp:  No accessory muscle use, clear breath sounds without wheezes rales or rhonchi  Card:  No murmurs, normal S1, S2 without thrills, + anasarca (but much improved)  Abd:  Soft, non-tender, non-distended, normoactive bowel sounds are present  Musc:  No cyanosis or clubbing  Skin:  No rashes  Neuro:  Cranial nerves 3-12 are grossly intact, follows commands appropriately  Psych:  Good insight, oriented to person, place and time, alert    Disposition: HH  Discharge Condition: Stable    Patient Instructions:   Current Discharge Medication List        START taking these medications    Details   gabapentin (NEURONTIN) 100 MG capsule Take 1 capsule by mouth 3 times daily for 60 days. Max Daily Amount: 300 mg  Qty: 90 capsule, Refills: 1    Associated Diagnoses: Anasarca      Probiotic Product (ACIDOPHILUS PROBIOTIC) CAPS capsule Take 1 capsule by mouth daily  Qty: 30 capsule, Refills: 1  CONTINUE these medications which have CHANGED    Details   bumetanide (BUMEX) 1 MG tablet Take 1 tablet by mouth 2 times daily  Qty: 60 tablet, Refills: 1      potassium chloride (KLOR-CON M) 20 MEQ extended release tablet Take 1 tablet by mouth daily  Qty: 30 tablet, Refills: 1      midodrine (PROAMATINE) 10 MG tablet Take 1 tablet by mouth 3 times daily  Qty: 90 tablet, Refills: 1           CONTINUE these medications which have NOT CHANGED    Details   amiodarone (CORDARONE) 200 MG tablet Take 1 tablet by mouth daily      glipiZIDE (GLUCOTROL) 5 MG tablet Take 1 tablet by mouth 2 times daily (before meals)      mexiletine (MEXITIL) 150 MG capsule Take 1 capsule by mouth 3 times daily      albuterol sulfate HFA (PROVENTIL;VENTOLIN;PROAIR) 108 (90 Base) MCG/ACT inhaler Inhale 2 puffs into the lungs every 6 hours as needed      aspirin 81 MG chewable tablet Take 1 tablet by mouth daily      atorvastatin (LIPITOR) 10 MG tablet Take 1 tablet by mouth daily      citalopram (CELEXA) 40 MG tablet Take 1 tablet by mouth  daily      fluticasone (FLONASE) 50 MCG/ACT nasal spray 2 sprays by Nasal route daily as needed      levothyroxine (SYNTHROID) 150 MCG tablet Take 1 tablet by mouth every morning (before breakfast)      miconazole (MICOTIN) 2 % powder Apply topically 2 times daily      montelukast (SINGULAIR) 10 MG tablet Take 1 tablet by mouth nightly      polyethylene glycol (GLYCOLAX) 17 GM/SCOOP powder Take 17 g by mouth daily as needed      sucralfate (CARAFATE) 1 GM tablet Take 1 tablet by mouth 3 times daily      traZODone (DESYREL) 100 MG tablet Take 1 tablet by mouth nightly           STOP taking these medications       fludrocortisone (FLORINEF) 0.1 MG tablet Comments:   Reason for Stopping:         acetaminophen (TYLENOL) 500 MG tablet Comments:   Reason for Stopping:         arformoterol tartrate (BROVANA) 15 MCG/2ML NEBU Comments:   Reason for Stopping:         budesonide (PULMICORT) 0.5 MG/2ML nebulizer suspension Comments:   Reason for Stopping:         glimepiride (AMARYL) 2 MG tablet Comments:   Reason for Stopping:         lidocaine 4 % external patch Comments:   Reason for Stopping:         metoprolol succinate (TOPROL XL) 25 MG extended release tablet Comments:   Reason for Stopping:         naloxone 4 MG/0.1ML LIQD nasal spray Comments:   Reason for Stopping:             Activity: activity as tolerated  Diet: cardiac diet  Wound Care: as directed    Follow-up with  [facility]  [phone]  Follow up  Home health    [name], APRN - NP  [address] [place]  [address]  [place] [address]  [phone]    Follow up on 07/06/2021  10:20 am    Carolina Sink  Claudean Kinds, APRN - NP  [address] [place] [address]  [phone]    Follow up on 07/02/2021  Hospital Follow-up appointment at 11:00 am . Please bring all discharge paperwork with you to appointment . Please all have Hospital Fax all medical records to Dr. Raford Pitcher at [phone] at discharge.      Follow-up tests/labs  none    Signed:  Paulo Fruit, MD  06/28/2021  10:44 AM  **I personally spent 35 min on discharge**

## 2021-07-03 NOTE — Telephone Encounter (Signed)
Attempt made to call patient for purpose of care transition.  Voice mail box was full.  Unable to leave a message.  Patient stated she has poor cell service in her home when in the hospital.  She does have my contact information to call for questions regarding her HF instructions or any concerns from discharge.

## 2021-07-04 ENCOUNTER — Inpatient Hospital Stay
Admit: 2021-07-04 | Discharge: 2021-07-04 | Disposition: A | Discharge status: Home / Self Care | Payer: MEDICARE | Attending: Student in an Organized Health Care Education/Training Program

## 2021-07-04 ENCOUNTER — Emergency Department: Admit: 2021-07-04 | Payer: MEDICARE | Primary: Family

## 2021-07-04 DIAGNOSIS — W19XXXA Unspecified fall, initial encounter: Secondary | ICD-10-CM

## 2021-07-04 DIAGNOSIS — S0990XA Unspecified injury of head, initial encounter: Secondary | ICD-10-CM

## 2021-07-04 LAB — CBC WITH AUTO DIFFERENTIAL
Basophils %: 1 % (ref 0–1)
Basophils Absolute: 0 10*3/uL (ref 0.0–0.1)
Eosinophils %: 4 % (ref 0–7)
Eosinophils Absolute: 0.2 10*3/uL (ref 0.0–0.4)
Hematocrit: 37.3 % (ref 35.0–47.0)
Hemoglobin: 11.6 g/dL (ref 11.5–16.0)
Immature Granulocytes Absolute: 0 10*3/uL (ref 0.00–0.04)
Immature Granulocytes: 0 % (ref 0.0–0.5)
Lymphocytes %: 37 % (ref 12–49)
Lymphocytes Absolute: 2.1 10*3/uL (ref 0.8–3.5)
MCH: 27.8 PG (ref 26.0–34.0)
MCHC: 31.1 g/dL (ref 30.0–36.5)
MCV: 89.2 FL (ref 80.0–99.0)
MPV: 11.6 FL (ref 8.9–12.9)
Monocytes %: 9 % (ref 5–13)
Monocytes Absolute: 0.5 10*3/uL (ref 0.0–1.0)
Neutrophils %: 50 % (ref 32–75)
Neutrophils Absolute: 2.9 10*3/uL (ref 1.8–8.0)
Nucleated RBCs: 0 PER 100 WBC
Platelets: 121 10*3/uL — ABNORMAL LOW (ref 150–400)
RBC: 4.18 M/uL (ref 3.80–5.20)
RDW: 15.7 % — ABNORMAL HIGH (ref 11.5–14.5)
WBC: 5.7 10*3/uL (ref 3.6–11.0)
nRBC: 0 10*3/uL (ref 0.00–0.01)

## 2021-07-04 LAB — COMPREHENSIVE METABOLIC PANEL
ALT: 20 U/L (ref 12–78)
AST: 30 U/L (ref 15–37)
Albumin/Globulin Ratio: 0.9 — ABNORMAL LOW (ref 1.1–2.2)
Albumin: 3.8 g/dL (ref 3.5–5.0)
Alk Phosphatase: 81 U/L (ref 45–117)
Anion Gap: 4 mmol/L — ABNORMAL LOW (ref 5–15)
BUN: 32 MG/DL — ABNORMAL HIGH (ref 6–20)
Bun/Cre Ratio: 16 (ref 12–20)
CO2: 30 mmol/L (ref 21–32)
Calcium: 9.2 MG/DL (ref 8.5–10.1)
Chloride: 107 mmol/L (ref 97–108)
Creatinine: 2 MG/DL — ABNORMAL HIGH (ref 0.55–1.02)
Est, Glom Filt Rate: 27 mL/min/{1.73_m2} — ABNORMAL LOW (ref >60)
Globulin: 4.1 g/dL — ABNORMAL HIGH (ref 2.0–4.0)
Glucose: 84 mg/dL (ref 65–100)
Potassium: 4.4 mmol/L (ref 3.5–5.1)
Sodium: 141 mmol/L (ref 136–145)
Total Bilirubin: 2.2 MG/DL — ABNORMAL HIGH (ref 0.2–1.0)
Total Protein: 7.9 g/dL (ref 6.4–8.2)

## 2021-07-04 LAB — URINALYSIS WITH MICROSCOPIC
Bilirubin Urine: NEGATIVE
Blood, Urine: NEGATIVE
Glucose, UA: NEGATIVE mg/dL
Hyaline Casts, UA: >20 /lpf — ABNORMAL HIGH (ref 0–5)
Ketones, Urine: NEGATIVE mg/dL
Nitrite, Urine: NEGATIVE
Protein, UA: NEGATIVE mg/dL
Specific Gravity, UA: 1.011 (ref 1.003–1.030)
Urobilinogen, Urine: 1 EU/dL (ref 0.2–1.0)
pH, Urine: 5 (ref 5.0–8.0)

## 2021-07-04 LAB — TROPONIN: Troponin, High Sensitivity: 16 ng/L (ref 0–51)

## 2021-07-04 LAB — EXTRA TUBES HOLD

## 2021-07-04 NOTE — Discharge Instructions (Signed)
Continue to monitor symptoms at home.  Take Advil or Tylenol as needed for pain.  Follow-up with PCP and return with any changes or worsening.

## 2021-07-04 NOTE — ED Triage Notes (Signed)
Pt arrives to the ER for a fall. Pt reports that she stood up from a chair when she became dizzy and then fell to the ground, striking her head on a floor fan.     Pt reports pressure in posterior of head and bilateral blurry vision.     Pt denies any LOC.

## 2021-07-04 NOTE — ED Provider Notes (Signed)
[facility] EMERGENCY DEPT  EMERGENCY DEPARTMENT ENCOUNTER      Pt Name: Deborah [name]  MRN: [id number]  Birthdate 01-Jun-1955  Date of evaluation: 07/04/2021  Provider: Ileene Hutchinson, PA    CHIEF COMPLAINT       Chief Complaint   Patient presents with    Fall         HISTORY OF PRESENT ILLNESS   (Location/Symptom, Timing/Onset, Context/Setting, Quality, Duration, Modifying Factors, Severity)  Note limiting factors.   66 y.o. female with history of CHF, anxiety, depression, asthma, COPD, CKD, GERD presents to ED with HA s/p fall. Patient reports that PTA she was standing up from sitting down when she suddenly felt lightheaded and fell. She reports that she hit her head on a floor fan during the fall but denies LOC. She reports that her son was there and was able to help her up with assistance. She notes that she now feels like her vision is blurred, she has a headache and slight intermittent nausea. Denies any CP, SOB, cough, fevers, chills, vomiting, numbness, tingling. She is on a baby aspirin daily but no other blood thinners. She also notes that she has some low back pain s/p fall    The history is provided by the patient.       Review of External Medical Records:     Nursing Notes were reviewed.    REVIEW OF SYSTEMS    (2-9 systems for level 4, 10 or more for level 5)     Review of Systems   Constitutional:  Negative for chills and fever.   HENT:  Negative for congestion, ear pain and sore throat.    Eyes:  Negative for pain.   Respiratory:  Negative for cough and shortness of breath.    Cardiovascular:  Negative for chest pain.   Gastrointestinal:  Negative for abdominal pain, diarrhea, nausea and vomiting.   Genitourinary:  Negative for dysuria and flank pain.   Musculoskeletal:  Positive for back pain. Negative for myalgias.   Skin:  Negative for rash.   Neurological:  Positive for light-headedness and headaches. Negative for dizziness.   Hematological:  Negative for adenopathy.     Except as noted above the  remainder of the review of systems was reviewed and negative.       PAST MEDICAL HISTORY     Past Medical History:   Diagnosis Date    (HFpEF) heart failure with preserved ejection fraction (HCC)     Anxiety and depression     Aortic valve replaced     S/p bovine aortic valve replacement.    Asthma     Chronic narcotic use     Chronic obstructive pulmonary disease (HCC)     Chronic pain     CKD (chronic kidney disease), stage III (HCC)     Baseline creatinine is 1.3-1.4 with GFR in the 40s.    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)     GERD (gastroesophageal reflux disease)     History of vascular access device 04/13/2021    4 FR Single PICC for LTABX: R cephalic vessell length 48 CM Max P leave @ 1 CM out; Arm circumferenc 40 CM    Hyperlipidemia     Hypothyroidism     Morbid obesity (HCC)     Neuropathy     Obstructive sleep apnea     Rhinitis          SURGICAL HISTORY       Past Surgical  History:   Procedure Laterality Date    AORTIC VALVE REPLACEMENT      Bovine bioprosthetic    COLONOSCOPY N/A 12/01/2020    COLONOSCOPY performed by Ian Malkin, MD at [facility] ENDOSCOPY    PACEMAKER           CURRENT MEDICATIONS       Previous Medications    ALBUTEROL SULFATE HFA (PROVENTIL;VENTOLIN;PROAIR) 108 (90 BASE) MCG/ACT INHALER    Inhale 2 puffs into the lungs every 6 hours as needed    AMIODARONE (CORDARONE) 200 MG TABLET    Take 1 tablet by mouth daily    ASPIRIN 81 MG CHEWABLE TABLET    Take 1 tablet by mouth daily    ATORVASTATIN (LIPITOR) 10 MG TABLET    Take 1 tablet by mouth daily    BUMETANIDE (BUMEX) 1 MG TABLET    Take 1 tablet by mouth 2 times daily    CITALOPRAM (CELEXA) 40 MG TABLET    Take 1 tablet by mouth daily    FLUTICASONE (FLONASE) 50 MCG/ACT NASAL SPRAY    2 sprays by Nasal route daily as needed    GABAPENTIN (NEURONTIN) 100 MG CAPSULE    Take 1 capsule by mouth 3 times daily for 60 days. Max Daily Amount: 300 mg    GLIPIZIDE (GLUCOTROL) 5 MG TABLET    Take 1 tablet by mouth 2 times daily (before meals)     LEVOTHYROXINE (SYNTHROID) 150 MCG TABLET    Take 1 tablet by mouth every morning (before breakfast)    MEXILETINE (MEXITIL) 150 MG CAPSULE    Take 1 capsule by mouth 3 times daily    MICONAZOLE (MICOTIN) 2 % POWDER    Apply topically 2 times daily    MIDODRINE (PROAMATINE) 10 MG TABLET    Take 1 tablet by mouth 3 times daily    MONTELUKAST (SINGULAIR) 10 MG TABLET    Take 1 tablet by mouth nightly    POLYETHYLENE GLYCOL (GLYCOLAX) 17 GM/SCOOP POWDER    Take 17 g by mouth daily as needed    POTASSIUM CHLORIDE (KLOR-CON M) 20 MEQ EXTENDED RELEASE TABLET    Take 1 tablet by mouth daily    PROBIOTIC PRODUCT (ACIDOPHILUS PROBIOTIC) CAPS CAPSULE    Take 1 capsule by mouth daily    SUCRALFATE (CARAFATE) 1 GM TABLET    Take 1 tablet by mouth 3 times daily    TRAZODONE (DESYREL) 100 MG TABLET    Take 1 tablet by mouth nightly       ALLERGIES     Nitroglycerin, Hydrochlorothiazide, and Aloe vera    FAMILY HISTORY       Family History   Problem Relation Age of Onset    Hypertension Mother     Hypertension Father           SOCIAL HISTORY       Social History     Socioeconomic History    Marital status: Single   Tobacco Use    Smoking status: Former     Packs/day: 1.00     Types: Cigarettes     Quit date: 03/21/2010     Years since quitting: 11.2    Smokeless tobacco: Never   Substance and Sexual Activity    Alcohol use: Not Currently    Drug use: Never           PHYSICAL EXAM    (up to 7 for level 4, 8 or more for level 5)  ED Triage Vitals   BP Temp Temp src Pulse Resp SpO2 Height Weight   -- -- -- -- -- -- -- --       Body mass index is 46.36 kg/m.    Physical Exam  Vitals and nursing note reviewed.   Constitutional:       General: She is not in acute distress.     Appearance: Normal appearance. She is obese.   Eyes:      Extraocular Movements: Extraocular movements intact.      Pupils: Pupils are equal, round, and reactive to light.   Cardiovascular:      Rate and Rhythm: Normal rate and regular rhythm.      Heart  sounds: Normal heart sounds.   Pulmonary:      Effort: Pulmonary effort is normal.      Breath sounds: Normal breath sounds.   Abdominal:      General: Bowel sounds are normal.      Palpations: Abdomen is soft.      Tenderness: There is no abdominal tenderness.   Musculoskeletal:      Cervical back: Normal.      Thoracic back: Normal.      Lumbar back: Tenderness (paraspinal mm) present. No bony tenderness. Normal range of motion.   Skin:     General: Skin is warm and dry.   Neurological:      General: No focal deficit present.      Mental Status: She is alert and oriented to person, place, and time.   Psychiatric:         Mood and Affect: Mood normal.         Behavior: Behavior normal.         Thought Content: Thought content normal.       DIAGNOSTIC RESULTS     EKG: All EKG's are interpreted by the Emergency Department Physician who either signs or Co-signs this chart in the absence of a cardiologist.        RADIOLOGY:   Non-plain film images such as CT, Ultrasound and MRI are read by the radiologist. Plain radiographic images are visualized and preliminarily interpreted by the emergency physician with the below findings:        Interpretation per the Radiologist below, if available at the time of this note:    CT Head W/O Contrast   Final Result   1. No evidence of acute intracranial abnormality by this modality.             XR LUMBAR SPINE (2-3 VIEWS)   Final Result   Multilevel lumbosacral degenerative disc disease without acute   abnormality.              LABS:  Labs Reviewed   CBC WITH AUTO DIFFERENTIAL - Abnormal; Notable for the following components:       Result Value    RDW 15.7 (*)     Platelets 121 (*)     All other components within normal limits   COMPREHENSIVE METABOLIC PANEL - Abnormal; Notable for the following components:    Anion Gap 4 (*)     BUN 32 (*)     Creatinine 2.00 (*)     Est, Glom Filt Rate 27 (*)     Total Bilirubin 2.2 (*)     Globulin 4.1 (*)     Albumin/Globulin Ratio 0.9 (*)      All other components within normal limits   URINALYSIS WITH MICROSCOPIC - Abnormal; Notable  for the following components:    Appearance CLOUDY (*)     Leukocyte Esterase, Urine MODERATE (*)     Epithelial Cells UA MANY (*)     BACTERIA, URINE 3+ (*)     Hyaline Casts, UA >20 (*)     All other components within normal limits   EXTRA TUBES HOLD   TROPONIN   EXTRA TUBES HOLD       All other labs were within normal range or not returned as of this dictation.    EMERGENCY DEPARTMENT COURSE and DIFFERENTIAL DIAGNOSIS/MDM:   Vitals:    Vitals:    07/04/21 1443 07/04/21 1445   BP:  (!) 110/57   Pulse: 68    Resp: 16    Temp: 98.3 F (36.8 C)    TempSrc: Oral    SpO2: 94%    Weight: 134.3 kg (296 lb)    Height: 1.702 m (5\' 7" )            Medical Decision Making  66 y.o. female with history of CHF, anxiety, depression, asthma, COPD, CKD, GERD presents to ED with HA s/p fall.  Vital signs stable in triage and patient afebrile.  Physical exam notable for tenderness to lumbar paraspinal muscles but no focal neurodeficits and cranial nerves intact bilaterally.  Labs unremarkable with slight bump in creatinine at 2.0 but mostly at baseline for patient with troponin at baseline as well.  CT of head showed no acute abnormalities.  X-ray of lumbar spine showed no acute bony abnormalities.  Patient did not want anything for pain while in ED when offered.  Patient is awake, alert, with normal neurological exam, normal CT and improving symptoms. Given patient's age, physical exam findings, mechanism of injury, and improvement of symptoms during the observation period, there is no need for admission at this time. Will discharge the patient home with supportive care and follow-up with PCP in 1-2 days.     Patient and caregivers were educated on signs/symptoms of post-concussion syndrome, and told to return with significant changes in mental status, worsening headache, persistent vomiting, or other concerning symptoms.    Results and  findings have been communicated and explained thoroughly to patient and family members. Patient has been educated on strict return precautions as well as instructions for conservative care and follow-up. Patient verbalizes understanding and no socioeconomic barriers to care were identified during this visit. Patient expresses no further concerns at this time and will be discharged with AVS and education paperwork.       Amount and/or Complexity of Data Reviewed  Labs: ordered. Decision-making details documented in ED Course.  Radiology: ordered.  ECG/medicine tests: ordered.            REASSESSMENT     ED Course as of 07/04/21 1618   Wed Jul 04, 2021   1501 1451 EKG shows atrial sensed ventricular paced rhythm with a rate of 69 bpm with no acute ST or T wave abnormalities suggestive of ischemia. [WJ]   1552 XR lumbar spine: Multilevel lumbosacral degenerative disc disease without acute  abnormality. [AH]   1607 WBC, UA: 10-20 [AL]   1607 Leukocyte Esterase, Urine(!): MODERATE [AL]   1607 Bacteria, UA(!): 3+ [AL]      ED Course User Index  [AH] Jul 06, 2021, PA  [AL] Ileene Hutchinson, MD  [WJ] Janeece Fitting, DO           CONSULTS:  None    PROCEDURES:  Unless otherwise noted below, none  Procedures      FINAL IMPRESSION      1. Fall, initial encounter    2. Injury of head, initial encounter    3. Lightheadedness          DISPOSITION/PLAN   DISPOSITION Decision To Discharge 07/04/2021 04:14:55 PM      PATIENT REFERRED TO:  [name], APRN - NP  [address]  [place] [address]  [phone]    Schedule an appointment as soon as possible for a visit   As follow up in next week    [facility] EMERGENCY DEPT  [address] [place]  [place] [address]  [phone]  Go to   If symptoms worsen or change      DISCHARGE MEDICATIONS:  New Prescriptions    No medications on file         (Please note that portions of this note were completed with a voice recognition program.  Efforts were made to edit  the dictations but occasionally words are mis-transcribed.)    Ileene Hutchinson, PA (electronically signed)  Emergency Attending Physician / Physician Assistant / Nurse Practitioner              Ileene Hutchinson, PA  07/04/21 [phone]

## 2021-07-04 NOTE — ED Notes (Signed)
Discharged by provider. Instructions and results reviewed by provider. Leaves via wheelchair in no acute distress. Denies questions or concerns.      Burt Knack, RN  07/04/21 [phone]

## 2021-07-06 ENCOUNTER — Ambulatory Visit: Admit: 2021-07-06 | Discharge: 2021-07-06 | Payer: MEDICARE | Primary: Family

## 2021-07-06 DIAGNOSIS — I5033 Acute on chronic diastolic (congestive) heart failure: Secondary | ICD-10-CM

## 2021-07-06 LAB — EKG 12-LEAD
Atrial Rate: 69 {beats}/min
P Axis: 34 degrees
P-R Interval: 236 ms
Q-T Interval: 460 ms
QRS Duration: 112 ms
QTc Calculation (Bazett): 492 ms
R Axis: -70 degrees
T Axis: 49 degrees
Ventricular Rate: 69 {beats}/min

## 2021-07-06 NOTE — Progress Notes (Signed)
Patient: Deborah [name]  DOB: Aug 17, 1955    Primary Cardiologist: Dr. Barry Dienes  PCP: [name], APRN - NP    Today's Date: 07/06/2021      ASSESSMENT AND PLAN:     Assessment and Plan:  Hospital Follow Up    2. Acute on chronic HFpEF  - 02/20/21 Nuclear stress negative for ischemia  -05/22/21 TTE w/ EF 60-65%. No need to repeat.  - Continue statin  - Unable to add GDMT due to issues with hypotension  - No Jardiance due to infection and UTI hx.- avoid sglt2 due to hx of  ESBL ecoli on prior urine culture   -Not on ASA, restarted.  -repeat BMP  -Discussed low sodium diet   -Decrease bumex to 1mg / daily and BID MWF, due to Cr 2  - Repeat BMP 1-2 weeks     3. Hx a fib s/p PPM (not MRI compatible), PVCs  -Has had bigeminy and intermittent episodes of SVT with Pacer tracking at 150 bpm - started on PO amio, initial improvement but now mostly in bigeminy. Continue Amio and mexiletine.   -Future plans for PVC ablation with Dr. Loma Newton. 8/3  -Metoprolol was stopped last admission due to hypotension.     4. Hypotension: Worked up last admission unclear etiology. Discharged on Midodrine 10mg  TID  - florinef stopped due to fluid retention  -discussed wearing compression socks to decrease dizziness.    5. s/p AVR,HDL  -No significant AV stenosis on TTE  -Cont statin  -Restart ASA    6. Prior bacteremia due to group B Streptococcus: GBS bacteremia complicated by pacemaker line vegetation. Completed Ceftriaxone 05/23/21 after 6 weeks     7. chronic respiratory failure with hypoxia and hypercapnia/COPD/OSA:   On CPAP    8. T 2 DM  Per PCP     9. AKI on CKD  -Cr 2 on 6/7  -Decrease bumex to 1mg / daily and BID MWF, due to Cr 2  - Repeat BMP 1-2 weeks     10. Obesity: Body mass index is 46.2 kg/m. Discussed lifestyle changes    Follow up with 3 months with Dr. Barry Dienes      ICD-10-CM    1. Acute on chronic heart failure with preserved ejection fraction (HFpEF) (HCC)  I50.33       2. Pacemaker  Z95.0       3. PVC (premature  ventricular contraction)  I49.3       4. History of atrial fibrillation  Z86.79       5. History of aortic valve replacement  Z95.2       6. Hypotension, unspecified hypotension type  I95.9       7. DM (diabetes mellitus), type 2 with complications (HCC)  E11.8       8. Acute on chronic respiratory failure with hypoxia and hypercapnia (HCC)  J96.21     J96.22       9. Chronic obstructive pulmonary disease, unspecified COPD type (HCC)  J44.9       10. OSA (obstructive sleep apnea)  G47.33       11. Stage 3 chronic kidney disease, unspecified whether stage 3a or 3b CKD (HCC)  N18.30       12. BMI 50.0-59.9, adult (HCC)  Z68.43       13. History of bacteremia  Z87.898           HISTORY OF PRESENT ILLNESS:     History of Present Illness:  Deborah [name] is  a 66 y.o. female who was admitted 5/24-6/1 for acute on chronic HFpEF and Afib. Patient discharged for CHF on 5/2 following prolonged hospitalization. Recent ED visit for 6/7 for a fall.    PMH significant for HFpEF, Afib, CKD, s/p AVR, COPD, OSA, obesity, and DM.     Today...  In WC. Feels ok, occasional discomfort with PVCs. Has ablation scheduled.Discussed slowing movements to prevent dizziness.    Denies chest pain, edema, syncope, shortness of breath at rest, dyspnea on exertion, PND or orthopnea.  Has no tachycardia, palpitations or sense of arrhythmia.     PAST MEDICAL HISTORY:     Past Medical History:   Diagnosis Date    (HFpEF) heart failure with preserved ejection fraction (HCC)     Anxiety and depression     Aortic valve replaced     S/p bovine aortic valve replacement.    Asthma     Chronic narcotic use     Chronic obstructive pulmonary disease (HCC)     Chronic pain     CKD (chronic kidney disease), stage III (HCC)     Baseline creatinine is 1.3-1.4 with GFR in the 40s.    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)     GERD (gastroesophageal reflux disease)     History of vascular access device 04/13/2021    4 FR Single PICC for LTABX: R cephalic vessell length  48 CM Max P leave @ 1 CM out; Arm circumferenc 40 CM    Hyperlipidemia     Hypothyroidism     Morbid obesity (HCC)     Neuropathy     Obstructive sleep apnea     Rhinitis        Past Surgical History:   Procedure Laterality Date    AORTIC VALVE REPLACEMENT      Bovine bioprosthetic    COLONOSCOPY N/A 12/01/2020    COLONOSCOPY performed by Ian Malkin, MD at [facility] ENDOSCOPY    PACEMAKER         CURRENT MEDICATIONS:    .  Current Outpatient Medications   Medication Sig Dispense Refill    diclofenac (VOLTAREN) 50 MG EC tablet Take 1 tablet by mouth daily      gabapentin (NEURONTIN) 100 MG capsule Take 1 capsule by mouth 3 times daily for 60 days. Max Daily Amount: 300 mg 90 capsule 1    Probiotic Product (ACIDOPHILUS PROBIOTIC) CAPS capsule Take 1 capsule by mouth daily 30 capsule 1    bumetanide (BUMEX) 1 MG tablet Take 1 tablet by mouth 2 times daily 60 tablet 1    potassium chloride (KLOR-CON M) 20 MEQ extended release tablet Take 1 tablet by mouth daily 30 tablet 1    midodrine (PROAMATINE) 10 MG tablet Take 1 tablet by mouth 3 times daily 90 tablet 1    amiodarone (CORDARONE) 200 MG tablet Take 1 tablet by mouth daily      glipiZIDE (GLUCOTROL) 5 MG tablet Take 1 tablet by mouth 2 times daily (before meals)      mexiletine (MEXITIL) 150 MG capsule Take 1 capsule by mouth 3 times daily      albuterol sulfate HFA (PROVENTIL;VENTOLIN;PROAIR) 108 (90 Base) MCG/ACT inhaler Inhale 2 puffs into the lungs every 6 hours as needed      atorvastatin (LIPITOR) 10 MG tablet Take 1 tablet by mouth daily      fluticasone (FLONASE) 50 MCG/ACT nasal spray 2 sprays by Nasal route daily as needed  levothyroxine (SYNTHROID) 150 MCG tablet Take 1 tablet by mouth every morning (before breakfast)      miconazole (MICOTIN) 2 % powder Apply topically 2 times daily      montelukast (SINGULAIR) 10 MG tablet Take 1 tablet by mouth nightly      sucralfate (CARAFATE) 1 GM tablet Take 1 tablet by mouth 3 times daily      traZODone (DESYREL)  100 MG tablet Take 1 tablet by mouth nightly      aspirin 81 MG chewable tablet Take 1 tablet by mouth daily (Patient not taking: Reported on 07/06/2021)      citalopram (CELEXA) 40 MG tablet Take 1 tablet by mouth daily      polyethylene glycol (GLYCOLAX) 17 GM/SCOOP powder Take 17 g by mouth daily as needed (Patient not taking: Reported on 07/06/2021)       No current facility-administered medications for this visit.       Allergies   Allergen Reactions    Nitroglycerin      Other reaction(s): Unknown (comments)  hypotension    Hydrochlorothiazide Other (See Comments)     Reports 'kidneys dry up"     Aloe Vera Rash       SOCIAL HISTORY:     Social History     Tobacco Use    Smoking status: Former     Packs/day: 1.00     Types: Cigarettes     Quit date: 03/21/2010     Years since quitting: 11.3    Smokeless tobacco: Never   Substance Use Topics    Alcohol use: Not Currently    Drug use: Never       FAMILY HISTORY:     Family History   Problem Relation Age of Onset    Hypertension Mother     Hypertension Father        REVIEW OF SYMPTOMS:     Review of Symptoms:  Negative except as above, all other systems reviewed and are negative for a Comprehensive ROS (10+)    PHYSICAL EXAM:     Physical Exam:  BP 120/72 (Site: Left Upper Arm, Position: Sitting, Cuff Size: Large Adult)   Pulse 82   Resp 18   Ht 5\' 7"  (1.702 m)   Wt 295 lb (133.8 kg)   SpO2 95%   BMI 46.20 kg/m     General: alert, cooperative, no distress, appears stated age  Neck: supple, symmetrical, trachea midline, no adenopathy, thyroid: not enlarged, symmetric, no tenderness/mass/nodules, no carotid bruit, and no JVD  Lungs: clear to auscultation bilaterally  Heart: regular rate and rhythm, S1, S2 normal, no murmur, click, rub or gallop  Abdomen: soft, non tender  Extremities: extremities normal, atraumatic, no cyanosis or +1LEE  Skin: No significant rashes  MSKTL: Overall good ROM ext  Neuro: Grossly intact  Psych: Appropriate affect    LABS / OTHER  STUDIES:     Lab Results   Component Value Date/Time    NA 141 07/04/2021 02:49 PM    K 4.4 07/04/2021 02:49 PM    CL 107 07/04/2021 02:49 PM    CO2 30 07/04/2021 02:49 PM    BUN 32 07/04/2021 02:49 PM    GFRAA 56 08/26/2020 04:25 PM    GLOB 4.1 07/04/2021 02:49 PM    ALT 20 07/04/2021 02:49 PM    AST 30 07/04/2021 02:49 PM       Lab Results   Component Value Date/Time    CHOL 106 04/30/2020 12:57 AM  HDL 76 04/30/2020 12:57 AM       Cholesterol, Total   Date Value Ref Range Status   04/30/2020 106 <200 MG/DL Final     Triglycerides   Date Value Ref Range Status   04/30/2020 87 <150 MG/DL Final     Comment:     Based on NCEP-ATP III:  Triglycerides <150 mg/dL  is considered normal, 150-199 mg/dL  borderline high,  160-109 mg/dL high and  greater than or equal to 500 mg/dL very high.     HDL   Date Value Ref Range Status   04/30/2020 76 MG/DL Final     Comment:     Based on NCEP ATP III, HDL Cholesterol <40 mg/dL is considered low and >32 mg/dL is elevated.     LDL Calculated   Date Value Ref Range Status   04/30/2020 12.6 0 - 100 MG/DL Final     Comment:     Based on the NCEP-ATP: LDL-C concentrations are considered  optimal <100 mg/dL,  near optimal/above Normal 100-129 mg/dL  Borderline High: 355-732, High: 160-189 mg/dL  Very High: Greater than or equal to 190 mg/dL           CARDIAC DIAGNOSTICS:     Cardiac Evaluation Includes:  I reviewed the test results below.  05/11/21    TRANSTHORACIC ECHOCARDIOGRAM (TTE) COMPLETE (CONTRAST/BUBBLE/3D PRN) 05/22/2021 10:53 PM (Final)    Narrative  This is a summary report. The complete report is available in the patient's medical record. If you cannot access the medical record, please contact the sending organization for a detailed fax or copy.      Contrast used: Definity.    Technical qualifiers: Echo study was technically difficult with poor endocardial visualization, limited due to patient's condition, limited due to patient tolerance and technically difficult due to  patient's heart rhythm.    Left Ventricle: Normal left ventricular systolic function with a visually estimated EF of 60 - 65%. Left ventricle size is normal. Mildly increased wall thickness. Unable to assess wall motion.    Aortic Valve: Not well visualized. Bioprosthetic valve. AV mean gradient is 34 mmHg. LVOT:AV VTI Index is 0.38.  With a mean PG 34 mmHg, DVI 0.38 and an intermediate jet counter, there is possibly some valve stenosis but likely not significant.    Signed by: Laddie Aquas, MD on 05/22/2021 10:53 PM      Lab Results   Component Value Date    LVEF 68 11/28/2020       02/16/21    NM STRESS TEST WITH MYOCARDIAL PERFUSION 02/20/2021  4:57 PM (Final)    Narrative  This is a summary report. The complete report is available in the patient's medical record. If you cannot access the medical record, please contact the sending organization for a detailed fax or copy.      Nuclear Findings: LV perfusion is normal. There is no evidence of inducible ischemia.    Nuclear Findings: The defect appears to be caused by breast attenuation.    ECG: Resting ECG demonstrates normal sinus rhythm.    ECG: Stress ECG was negative for ischemia.    Stress Test: A pharmacological stress test was performed using lexiscan. Hemodynamics are adequate for diagnosis. Blood pressure demonstrated a normal response and heart rate demonstrated a normal response to stress. The patient's heart rate recovery was normal. The patient reported dyspnea and no chest pain during the stress test.    Ventricular bigeminy  Unifocal PVC, RBBB and superior axis  PVCs    Signed by: Thurston Pounds, MD on 02/20/2021  4:57 PM      No results found for this or any previous visit.     Future Appointments   Date Time Provider Department Center   08/10/2021 10:00 AM SS INF7 CH1 LAB [facility]   08/10/2021 10:30 AM [place] Malachy Chamber, MD [facility] AM[facility]   08/14/2021 10:20 AM Jillyn Ledger, APRN - NP [facility] AMB   09/11/2021 10:20 AM PACEMAKER, [facility]FRANCES [facility] BS AMB    09/11/2021 10:40 AM Jillyn Ledger, APRN - NP [facility] BS AMB   03/13/2022  2:00 PM PACEMAKER, STFRANCES [facility] BS AMB   03/13/2022  2:20 PM Thurston Pounds, MD [facility] BS AMB       [name], APRN - NP    [facility]  [facility]    [address], [address]    [place], [place]  [address]    Ph: [phone]

## 2021-07-06 NOTE — Patient Instructions (Addendum)
Pleas e take baby Asprin    Decrease bumex to 1mg  daily and twice a day MWF,  Labs in 1-2 weeks to check kidney function    PVC ablation with Dr. Raelene Bott. 8/3

## 2021-07-06 NOTE — Progress Notes (Signed)
Chief Complaint   Patient presents with    Follow-Up from Hospital     CHF, Anasarca 5/24-6/1     Vitals:    07/06/21 1043   BP: 120/72   Pulse: 82   Resp: 18   SpO2: 95%     Patient presents in office for hospital follow up for CHF, Anasarca. She states she has recurring episodes of palpitations and chest discomfort "poking feeling" and then stops on your own.     Recent ED visit for falling while ambulating to the rest room.

## 2021-08-08 ENCOUNTER — Encounter

## 2021-08-09 ENCOUNTER — Inpatient Hospital Stay: Admit: 2021-08-09 | Payer: MEDICARE | Primary: Family

## 2021-08-09 ENCOUNTER — Ambulatory Visit: Admit: 2021-08-09 | Discharge: 2021-08-09 | Payer: MEDICARE | Attending: Hematology & Oncology | Primary: Family

## 2021-08-09 DIAGNOSIS — D696 Thrombocytopenia, unspecified: Secondary | ICD-10-CM

## 2021-08-09 LAB — CBC WITH AUTO DIFFERENTIAL
Absolute Immature Granulocyte: 0 10*3/uL (ref 0.00–0.04)
Basophils %: 1 % (ref 0–1)
Basophils Absolute: 0.1 10*3/uL (ref 0.0–0.1)
Eosinophils %: 3 % (ref 0–7)
Eosinophils Absolute: 0.2 10*3/uL (ref 0.0–0.4)
Hematocrit: 38.6 % (ref 35.0–47.0)
Hemoglobin: 12.2 g/dL (ref 11.5–16.0)
Immature Granulocytes: 0 % (ref 0.0–0.5)
Lymphocytes %: 39 % (ref 12–49)
Lymphocytes Absolute: 2.3 10*3/uL (ref 0.8–3.5)
MCH: 27.9 PG (ref 26.0–34.0)
MCHC: 31.6 g/dL (ref 30.0–36.5)
MCV: 88.3 FL (ref 80.0–99.0)
MPV: 11.5 FL (ref 8.9–12.9)
Monocytes %: 6 % (ref 5–13)
Monocytes Absolute: 0.3 10*3/uL (ref 0.0–1.0)
Neutrophils %: 51 % (ref 32–75)
Neutrophils Absolute: 2.9 10*3/uL (ref 1.8–8.0)
Nucleated RBCs: 0 PER 100 WBC
Platelets: 81 10*3/uL — ABNORMAL LOW (ref 150–400)
RBC: 4.37 M/uL (ref 3.80–5.20)
RDW: 15.2 % — ABNORMAL HIGH (ref 11.5–14.5)
WBC: 5.8 10*3/uL (ref 3.6–11.0)
nRBC: 0 10*3/uL (ref 0.00–0.01)

## 2021-08-09 LAB — FERRITIN: Ferritin: 33 NG/ML (ref 26–388)

## 2021-08-09 LAB — IRON AND TIBC
Iron % Saturation: 25 % (ref 20–50)
Iron: 102 ug/dL (ref 35–150)
TIBC: 403 ug/dL (ref 250–450)

## 2021-08-09 MED ORDER — SODIUM CHLORIDE 0.9 % IV BOLUS
0.9 | Freq: Once | INTRAVENOUS | Status: AC
Start: 2021-08-09 — End: 2021-08-09
  Administered 2021-08-09: 15:00:00 500 mL via INTRAVENOUS

## 2021-08-09 MED FILL — SODIUM CHLORIDE 0.9 % IV SOLN: 0.9 % | INTRAVENOUS | Qty: 500

## 2021-08-09 NOTE — Progress Notes (Signed)
1110. Call received from PCT reporting patient's BP was 88/27 and patient was "seeing stars". BP retake 88/46. Patient A&Ox4. Deborah [name] Pt reports she has been having hypotension since being released from the hospital about a month ago. Reports baseline fatigue but has not been "seeing stars".  24 G PIV established to R arm. MD notified. Orders received for 500 ml bolus over 1 hour. BP improved to 110/52 after hydration, patient reports feeling better, still fatigued but was no longer seeing stars. Patient encouraged to contact cardiologist today to discuss hypotension- has apt scheduled 7/19. Patient escorted to car via wheelchair by PCT.    BP (!) 110/52   Pulse 65       Rhina Brackett, RN

## 2021-08-09 NOTE — Progress Notes (Signed)
[facility]  [phone]    Hematology / Oncology Established Visit    Reason for Visit:   Deborah [name] is a 66 y.o. female who is seen for follow up of thrombocytopenia, anemia.     History of Present Illness:   Deborah [name] is a pleasant 66 y.o. female who is seen for follow up of thrombocytopenia, anemia. She was hospitalized in March 2023 for bacteremia and possible osteomyelitis. Then she was hospitalized in April 2023 for SOB, CHF exacerbation. At that time, she was noted to have thrombocytopenia and anemia. She was treated with IV iron infusions. She does report being seen by Hematology in [place] approx 5-6 yrs ago for anemia. Had EGD/Colonoscopy which was "normal"; received IV iron over the course of 6 weeks.  Followed for about 1 yr for labs checks and everything stayed stable. Has never taken Fe pills. Denies any changes in stools; no noted blood or black tarry stools. Has had blood in urine; seen by urology; now resolved x 6 months. Reports did have nose bleed in Dec 2022 requiring rocket placement. No nosebleeds since that time. She relocated to [place] from [facility]: Dec 2021.     She is scheduled for cardiac ablation in 08/30/21 for management of her PVCs. She was told this may help improve her HR as well. No melena/hematochezia.     Past Medical History:   Diagnosis Date    (HFpEF) heart failure with preserved ejection fraction (HCC)     Anxiety and depression     Aortic valve replaced     S/p bovine aortic valve replacement.    Asthma     Chronic narcotic use     Chronic obstructive pulmonary disease (HCC)     Chronic pain     CKD (chronic kidney disease), stage III (HCC)     Baseline creatinine is 1.3-1.4 with GFR in the 40s.    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)     GERD (gastroesophageal reflux disease)     History of vascular access device 04/13/2021    4 FR Single PICC for LTABX: R cephalic vessell length 48 CM Max P leave @ 1 CM out; Arm circumferenc 40 CM     Hyperlipidemia     Hypothyroidism     Morbid obesity ([place])     Neuropathy     Obstructive sleep apnea     Rhinitis       Past Surgical History:   Procedure Laterality Date    AORTIC VALVE REPLACEMENT      Bovine bioprosthetic    COLONOSCOPY N/A 12/01/2020    COLONOSCOPY performed by Rachael Darby, MD at [facility]     Tobacco Use    Smoking status: Former     Packs/day: 1.00     Types: Cigarettes     Quit date: 03/21/2010     Years since quitting: 11.3    Smokeless tobacco: Never   Substance Use Topics    Alcohol use: Not Currently    Widow: on disability: 3 children; all live in [place].    Family History   Problem Relation Age of Onset    Hypertension Mother     Hypertension Father    Family hx of cancer: mother vulva cancer: MGM: ovarian/ breast    Current Outpatient Medications   Medication Sig    diclofenac (VOLTAREN) 50 MG  EC tablet Take 1 tablet by mouth daily    gabapentin (NEURONTIN) 100 MG capsule Take 1 capsule by mouth 3 times daily for 60 days. Max Daily Amount: 300 mg    Probiotic Product (ACIDOPHILUS PROBIOTIC) CAPS capsule Take 1 capsule by mouth daily    bumetanide (BUMEX) 1 MG tablet Take 1 tablet by mouth 2 times daily    potassium chloride (KLOR-CON M) 20 MEQ extended release tablet Take 1 tablet by mouth daily    midodrine (PROAMATINE) 10 MG tablet Take 1 tablet by mouth 3 times daily    amiodarone (CORDARONE) 200 MG tablet Take 1 tablet by mouth daily    glipiZIDE (GLUCOTROL) 5 MG tablet Take 1 tablet by mouth 2 times daily (before meals)    mexiletine (MEXITIL) 150 MG capsule Take 1 capsule by mouth 3 times daily    albuterol sulfate HFA (PROVENTIL;VENTOLIN;PROAIR) 108 (90 Base) MCG/ACT inhaler Inhale 2 puffs into the lungs every 6 hours as needed    aspirin 81 MG chewable tablet Take 1 tablet by mouth daily    atorvastatin (LIPITOR) 10 MG tablet Take 1 tablet by mouth daily    citalopram (CELEXA) 40 MG tablet Take 1 tablet by mouth daily    fluticasone (FLONASE)  50 MCG/ACT nasal spray 2 sprays by Nasal route daily as needed    levothyroxine (SYNTHROID) 150 MCG tablet Take 1 tablet by mouth every morning (before breakfast)    miconazole (MICOTIN) 2 % powder Apply topically 2 times daily    montelukast (SINGULAIR) 10 MG tablet Take 1 tablet by mouth nightly    polyethylene glycol (GLYCOLAX) 17 GM/SCOOP powder Take 17 g by mouth daily as needed    sucralfate (CARAFATE) 1 GM tablet Take 1 tablet by mouth 3 times daily    traZODone (DESYREL) 100 MG tablet Take 1 tablet by mouth nightly     No current facility-administered medications for this visit.      Allergies   Allergen Reactions    Nitroglycerin      Other reaction(s): Unknown (comments)  hypotension    Hydrochlorothiazide Other (See Comments)     Reports 'kidneys dry up"     Tetanus Toxoids Swelling    Aloe Vera Rash        Review of Systems: A complete review of systems was obtained, negative except as described above.    Physical Exam:   Blood pressure (!) 97/58, pulse 80, temperature 97.9 F (36.6 C), temperature source Temporal, resp. rate 18, height _0  (1.702 m), weight (!) 300 lb 6.4 oz (136.3 kg), SpO2 95 %.    ECOG PS: 0  General: no distress, comes in wheelchair  Eyes: anicteric sclerae  HENT: oropharynx clear  Neck: supple  Lymphatic: no cervical, supraclavicular adenopathy  Respiratory: normal respiratory effort  CV: no peripheral edema  GI: soft, nontender, nondistended, no masses  Skin: no rashes; no ecchymoses; no petechiae    Results:     Lab Results   Component Value Date/Time    WBC 5.8 08/09/2021 11:19 AM    HGB 12.2 08/09/2021 11:19 AM    HCT 38.6 08/09/2021 11:19 AM    PLT 81 08/09/2021 11:19 AM    MCV 88.3 08/09/2021 11:19 AM    NEUTROABS 2.9 08/09/2021 11:19 AM     Lab Results   Component Value Date/Time    NA 141 07/04/2021 02:49 PM    K 4.4 07/04/2021 02:49 PM    CL 107 07/04/2021 02:49 PM  CO2 30 07/04/2021 02:49 PM    GLUCOSE 84 07/04/2021 02:49 PM    BUN 32 07/04/2021 02:49 PM     CREATININE 2.00 07/04/2021 02:49 PM    EGFR 58 05/29/2021 05:47 AM    CALCIUM 9.2 07/04/2021 02:49 PM    MG 1.9 06/28/2021 08:21 AM    PHOS 4.3 06/28/2021 08:21 AM     Lab Results   Component Value Date/Time    BILITOT 2.2 07/04/2021 02:49 PM    ALT 20 07/04/2021 02:49 PM    AST 30 07/04/2021 02:49 PM    ALKPHOS 81 07/04/2021 02:49 PM    ALKPHOS 37 05/26/2021 03:11 AM    PROT 7.9 07/04/2021 02:49 PM    LABALBU 3.8 07/04/2021 02:49 PM    GLOB 4.1 07/04/2021 02:49 PM     Lab Results   Component Value Date/Time    IRON 49 06/27/2021 03:55 AM    TIBC 388 06/27/2021 03:55 AM     No results found for: FOL, RBCF  Lab Results   Component Value Date/Time    TSH 0.76 05/10/2021 02:37 AM     No results found for: HAAB, HABT, HBSAG, HBSB, HBCM, HBCAB, XBCABS, HBEAB, HBEAG, XHEPCS, HBEGLT, HBEBLT, [id number]    M-SPIKE, [id number]   Date/Time Value Ref Range Status   05/28/2021 02:15 PM Not Observed Not Observed g/dL Final     FREE KAPPA LT CHAINS, SERUM   Date/Time Value Ref Range Status   05/28/2021 02:15 PM 41.1 (H) 3.3 - 19.4 mg/L Final     FREE LAMBDA LT CHAINS, SERUM   Date/Time Value Ref Range Status   05/28/2021 02:15 PM 21.2 5.7 - 26.3 mg/L Final     KAPPA/LAMBDA RATIO, SERUM   Date/Time Value Ref Range Status   05/28/2021 02:15 PM 1.94 (H) 0.26 - 1.65   Final     Comment:     (NOTE)  Performed At: [facility]  [address] [place], [place] [id number]  Rush Farmer MD BH:[id number]       5/123: The immunofixation pattern appears unremarkable. Evidence of monoclonal protein is not apparent.    Imaging:     Radiology report(s) reviewed.      Assessment & Plan:   Deborah [name] is a 66 y.o. female with PMhx of HTN, DM, CHF, PVCs s/p pacer seen for follow up of thrombocytopenia.     Thrombocytopenia  Chronic. Peripheral smear unrevealing. No folate / B12 def noted  Likely multifactorial due to splenomegaly from CHF and prior  infections (GBS bacteremia - completed Ceftriaxone course 4/26 and more recent ESBL E. coli  UTI - completed Meropenem on 4/29). Gammopathy panel unremarkable. PLT count fluctuating between 80-120 over past few months, likely related to splenomegaly from CHF.   -- No further workup planned     2. Normocytic anemia due to iron deficiency and anemia of chronic disease:   Ferritin 38 with % Fe 10. Received Venofer 272m x 3 in 04/2021. Hgb much improved to 12 range in 07/2021.  -- Rechecking CBC and iron studies, ferritin today - will call with results    3. CHF / PVCs:  Scheduled for cardiac ablation 08/30/21. Remains on diuretics. Hypotensive today and was given 5089mNS because she was lightheaded and seeing spots briefly. Advised her to follow up with Cardiology.        I appreciate the opportunity to participate in Deborah [name]'s care.    Signed By: RaRichardean CanalMD  August 09, 2021

## 2021-08-09 NOTE — Progress Notes (Signed)
Chief Complaint   Patient presents with    Follow-up           Vitals:    08/09/21 1012   BP: (!) 97/58   Pulse: 80   Resp: 18   Temp: 97.9 F (36.6 C)   SpO2: 95%            1. Have you been to the ER, urgent care clinic since your last visit?  Hospitalized since your last visit?  Yes [facility]  2. Have you seen or consulted any other health care providers outside of the [facility] since your last visit?  Include any pap smears or colon screening. No

## 2021-08-10 ENCOUNTER — Inpatient Hospital Stay: Payer: MEDICARE | Primary: Family

## 2021-08-10 DIAGNOSIS — D696 Thrombocytopenia, unspecified: Secondary | ICD-10-CM

## 2021-08-10 NOTE — Telephone Encounter (Signed)
Verified patient with two types of identifiers. Patient states that she was at the oncology infusion center yesterday and her blood pressure was in the 80s.  She was given a fluid bolus and BP was 110/52 before she left.  Heart rate was normal at this time.  She has had 2 falls over the past month due to lightheadedness and today she reports feeling nauseated.  Her BP today is 97/54.  Patient has been taking her midodrine as prescribed as well as wearing her compression stockings.  Will make MD/NP aware and follow up with any recommendations.  Patient verbalized understanding and will call with any other questions.      Future Appointments   Date Time Provider [place]   08/14/2021 10:20 AM Gabriel Rung, APRN - NP [facility] AMB   09/11/2021 10:20 AM PACEMAKER, STFRANCES [facility] AMB   09/11/2021 10:40 AM Gabriel Rung, APRN - NP [facility] AMB   10/10/2021 11:40 AM Lorain Childes, DO [facility] AMB   03/13/2022  2:00 PM PACEMAKER, STFRANCES [facility] AMB   03/13/2022  2:20 PM Shelle Iron, MD [facility] BS AMB

## 2021-08-13 NOTE — Telephone Encounter (Signed)
Verified patient with two types of identifiers.  Informed patient of Ailene Ravel NP recommendations.  BP on 08/12/21 was 110/62 and patient felt well this past weekend.  Patient states that her BP is not lower on the days that she takes Bumex BID.  She also states that she had her falls after standing up from her chair too quickly; she knows that she needs to make slower transitions from sitting to standing.  Patient states that she wears her compression stockings 3 times a week.  Encouraged patient to wear them every day.  Patient has appointment tomorrow with Carmell Austria NP.  Patient verbalized understanding and will call with any other questions.

## 2021-08-13 NOTE — Telephone Encounter (Signed)
Last note from Roseanne Reno, NP states Bumex was decreased to 1 mg bid MWF & 1 mg daily other days.  Has she made this change?  If so, recommend trying to scale back further to 1 mg po daily with additional 1 mg po daily prn instead.  Not able to go any higher on midodrine, & Florinef caused too much fluid retention.  Encourage continued use of compression stockings.

## 2021-08-14 ENCOUNTER — Ambulatory Visit: Admit: 2021-08-14 | Discharge: 2021-08-20 | Payer: MEDICARE | Attending: Nurse Practitioner | Primary: Family

## 2021-08-14 ENCOUNTER — Encounter

## 2021-08-14 DIAGNOSIS — I493 Ventricular premature depolarization: Secondary | ICD-10-CM

## 2021-08-14 NOTE — Progress Notes (Signed)
Room #: EP 5    Chief Complaint   Patient presents with    H&P     PVC ablation 08/30/21-Dr. Loma Newton     BP 100/62 (Site: Left Upper Arm, Position: Sitting, Cuff Size: Large Adult)   Pulse 78   Resp 20   Ht 5\' 7"  (1.702 m)   Wt (!) 301 lb (136.5 kg)   SpO2 96%   BMI 47.14 kg/m         Chest pain:  yes    Shortness of breath:  yes, 2 days ago episode at rest    Edema: slight    Palpitations, skipped beats, rapid heartbeat:  yes    Dizziness:  yes, has had 2 falls    Fatigue: yes, some days       1. Have you been to the ER, urgent care clinic since your last visit?  Hospitalized since your last visit? [facility]= 07/04/21-Fall    2. Have you seen or consulted any other health care providers outside of the [facility] since your last visit?  Include any pap smears or colon screening. NO      Refills:  NO

## 2021-08-14 NOTE — Patient Instructions (Addendum)
Your EP Study and PVC Ablation procedure has been scheduled for 08/30/21 at 1:00 pm, at [facility].     Please report to Admitting Department by 11:00 am, or 2 hours prior to your scheduled procedure. Please bring a list of your current medications and medication bottles, if able, to the hospital on this day.  You will be unable to drive after your procedure so please make sure to bring someone with you to your procedure.     You will need to have nothing to eat or drink after midnight, the night prior to your procedure. You may have small sips of water, if needed, to take with your medication.     You will need labs drawn prior to your procedure.      After your procedure, you will need to follow up with Foye Clock, NP. Your follow-up appointment has been scheduled for 09/11/21 at 10:40 am.     Let me know if you have any questions.     Thank You!

## 2021-08-14 NOTE — H&P (View-Only) (Signed)
Cardiac Electrophysiology OFFICE Follow Up Note             Assessment/Plan:   1. PVC (premature ventricular contraction)  -     EKG 12 Lead  -     CBC; Future  -     Basic Metabolic Panel; Future  2. Pacemaker  -     EKG 12 Lead  -     CBC; Future  -     Basic Metabolic Panel; Future  3. Aortic valve replaced  -     EKG 12 Lead  -     CBC; Future  -     Basic Metabolic Panel; Future    PVCs.   - Nuclear stress test 02/16/21 showed no ischemia. Ventricular bigeminy. Unifocal PVC, RBBB and superior axis PVCs   - Scheduled for PVC ablation 08/30/21 with Dr. Ngo   - Continues amiodarone and mexiletine       Pacemaker.    [employer] dual chamber pacemaker   - Continue Q3 month remote transmissions. Follow-up in the EP clinic in one year, or sooner for any concerns.     AVR   - Done in [place]   - Serial echos      OSA.   - Continue CPAP.           Subjective:         Deborah [name] is a 66 y.o. patient who is seen to update H&P prior to PVC ablation.     She denies chest pain, dyspnea or syncope. She does have palpitations and occasional dizziness upon standing.       Patient Active Problem List   Diagnosis    Arm paresthesia, left    Thrombocytopenia (HCC)    Morbid obesity (HCC)    CKD (chronic kidney disease), stage III (HCC)    Aortic valve replaced    Pacemaker    Asthma    Anxiety and depression    Chronic narcotic use    Chronic pain    Rhinitis    Hyperlipidemia    Neuropathy    GERD (gastroesophageal reflux disease)    CHF (congestive heart failure) (HCC)    Elevated bilirubin    Liver cirrhosis (HCC)    Colitis    Acute on chronic diastolic (congestive) heart failure (HCC)    PVC (premature ventricular contraction)    Systemic inflammatory response syndrome (SIRS) (HCC)    Acute on chronic heart failure with preserved ejection fraction (HFpEF) (HCC)    Hypothyroidism    Chronic respiratory failure with hypoxia (HCC)    BMI 50.0-59.9, adult (HCC)    Hypokalemia due to loss of potassium    DM (diabetes  mellitus), type 2 with complications (HCC)    Acute on chronic respiratory failure with hypoxia and hypercapnia (HCC)    OSA (obstructive sleep apnea)    COPD (chronic obstructive pulmonary disease) (HCC)    History of PSVT (paroxysmal supraventricular tachycardia)    Anemia    Hypomagnesemia    Anasarca         Current Outpatient Medications   Medication Sig Dispense Refill    diclofenac (VOLTAREN) 50 MG EC tablet Take 1 tablet by mouth daily      gabapentin (NEURONTIN) 100 MG capsule Take 1 capsule by mouth 3 times daily for 60 days. Max Daily Amount: 300 mg 90 capsule 1    Probiotic Product (ACIDOPHILUS PROBIOTIC) CAPS capsule Take 1 capsule by mouth daily 30 capsule 1      bumetanide (BUMEX) 1 MG tablet Take 1 tablet by mouth 2 times daily (Patient taking differently: Take 1 tablet by mouth three times a week Taking twice a day) 60 tablet 1    potassium chloride (KLOR-CON M) 20 MEQ extended release tablet Take 1 tablet by mouth daily 30 tablet 1    midodrine (PROAMATINE) 10 MG tablet Take 1 tablet by mouth 3 times daily 90 tablet 1    amiodarone (CORDARONE) 200 MG tablet Take 1 tablet by mouth daily      glipiZIDE (GLUCOTROL) 5 MG tablet Take 1 tablet by mouth 2 times daily (before meals)      mexiletine (MEXITIL) 150 MG capsule Take 1 capsule by mouth 3 times daily      albuterol sulfate HFA (PROVENTIL;VENTOLIN;PROAIR) 108 (90 Base) MCG/ACT inhaler Inhale 2 puffs into the lungs every 6 hours as needed      aspirin 81 MG chewable tablet Take 1 tablet by mouth daily      atorvastatin (LIPITOR) 10 MG tablet Take 1 tablet by mouth daily      citalopram (CELEXA) 40 MG tablet Take 1 tablet by mouth daily      fluticasone (FLONASE) 50 MCG/ACT nasal spray 2 sprays by Nasal route daily as needed      levothyroxine (SYNTHROID) 150 MCG tablet Take 1 tablet by mouth every morning (before breakfast)      miconazole (MICOTIN) 2 % powder Apply topically as needed      montelukast (SINGULAIR) 10 MG tablet Take 1 tablet by mouth  nightly      polyethylene glycol (GLYCOLAX) 17 GM/SCOOP powder Take 17 g by mouth daily as needed      sucralfate (CARAFATE) 1 GM tablet Take 1 tablet by mouth 3 times daily      traZODone (DESYREL) 100 MG tablet Take 1 tablet by mouth nightly       No current facility-administered medications for this visit.          Allergies   Allergen Reactions    Nitroglycerin      Other reaction(s): Unknown (comments)  hypotension    Hydrochlorothiazide Other (See Comments)     Reports 'kidneys dry up"     Tetanus Toxoids Swelling    Aloe Vera Rash          Past Medical History:   Diagnosis Date    (HFpEF) heart failure with preserved ejection fraction (HCC)     Anxiety and depression     Aortic valve replaced     S/p bovine aortic valve replacement.    Asthma     Chronic narcotic use     Chronic obstructive pulmonary disease (HCC)     Chronic pain     CKD (chronic kidney disease), stage III (HCC)     Baseline creatinine is 1.3-1.4 with GFR in the 40s.    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)     GERD (gastroesophageal reflux disease)     History of vascular access device 04/13/2021    4 FR Single PICC for LTABX: R cephalic vessell length 48 CM Max P leave @ 1 CM out; Arm circumferenc 40 CM    Hyperlipidemia     Hypothyroidism     Morbid obesity (HCC)     Neuropathy     Obstructive sleep apnea     Rhinitis            Past Surgical History:   Procedure Laterality Date    AORTIC VALVE  REPLACEMENT      Bovine bioprosthetic    COLONOSCOPY N/A 12/01/2020    COLONOSCOPY performed by Ian Malkin, MD at [facility] ENDOSCOPY    PACEMAKER           Family History   Problem Relation Age of Onset    Hypertension Mother     Hypertension Father        Social Connections: Not on file            Review of Systems:    12 point review of systems was performed. All negative except for HPI         Objective:     Vitals:    08/14/21 1022   BP: 100/62   Pulse: 78   Resp: 20   SpO2: 96%           Physical Exam:    General:  Alert and oriented, in no acute  distress; obese   Head:  Atraumatic, normocephalic   Eyes:  extraocular muscles intact   Neck:  Supple, normal range of motion   Lungs:  Clear to auscultation bilaterally, no wheezes/rales/rhonchi    Cardiovascular:  Regular rate and rhythm, normal S1-S2, no murmurs/rubs/gallops; ectopy    Abdomen:  Soft, nontender, nondistended, normoactive bowel sounds   Skin:  Intact, no rash   Extremities:, no clubbing, cyanosis, or edema   Musculoskeletal: normal range of motion; walks with cane    Neurological:  Alert and oriented, no focal neurologic deficits   Psychiatric:  Normal mood and affect       05/11/21    TRANSTHORACIC ECHOCARDIOGRAM (TTE) COMPLETE (CONTRAST/BUBBLE/3D PRN) 05/22/2021 10:53 PM (Final)    Narrative  This is a summary report. The complete report is available in the patient's medical record. If you cannot access the medical record, please contact the sending organization for a detailed fax or copy.      Contrast used: Definity.    Technical qualifiers: Echo study was technically difficult with poor endocardial visualization, limited due to patient's condition, limited due to patient tolerance and technically difficult due to patient's heart rhythm.    Left Ventricle: Normal left ventricular systolic function with a visually estimated EF of 60 - 65%. Left ventricle size is normal. Mildly increased wall thickness. Unable to assess wall motion.    Aortic Valve: Not well visualized. Bioprosthetic valve. AV mean gradient is 34 mmHg. LVOT:AV VTI Index is 0.38.  With a mean PG 34 mmHg, DVI 0.38 and an intermediate jet counter, there is possibly some valve stenosis but likely not significant.    Signed by: Laddie Aquas, MD on 05/22/2021 10:53 PM            Thank you for involving me in this patient's care and please call with further concerns or questions.         ________________________________________  Jillyn Ledger, NP  Cardiac Electrophysiology  [facility]  [address]  [place], [place] [address]  [phone]

## 2021-08-14 NOTE — Progress Notes (Signed)
Cardiac Electrophysiology OFFICE Follow Up Note             Assessment/Plan:   1. PVC (premature ventricular contraction)  -     EKG 12 Lead  -     CBC; Future  -     Basic Metabolic Panel; Future  2. Pacemaker  -     EKG 12 Lead  -     CBC; Future  -     Basic Metabolic Panel; Future  3. Aortic valve replaced  -     EKG 12 Lead  -     CBC; Future  -     Basic Metabolic Panel; Future    PVCs.   - Nuclear stress test 02/16/21 showed no ischemia. Ventricular bigeminy. Unifocal PVC, RBBB and superior axis PVCs   - Scheduled for PVC ablation 08/30/21 with Dr. Loma Newton   - Continues amiodarone and mexiletine       Pacemaker.    [employer] dual chamber pacemaker   - Continue Q3 month remote transmissions. Follow-up in the EP clinic in one year, or sooner for any concerns.     AVR   - Done in [place]   - Serial echos      OSA.   - Continue CPAP.           Subjective:         Deborah [name] is a 66 y.o. patient who is seen to update H&P prior to PVC ablation.     She denies chest pain, dyspnea or syncope. She does have palpitations and occasional dizziness upon standing.       Patient Active Problem List   Diagnosis    Arm paresthesia, left    Thrombocytopenia (HCC)    Morbid obesity (HCC)    CKD (chronic kidney disease), stage III (HCC)    Aortic valve replaced    Pacemaker    Asthma    Anxiety and depression    Chronic narcotic use    Chronic pain    Rhinitis    Hyperlipidemia    Neuropathy    GERD (gastroesophageal reflux disease)    CHF (congestive heart failure) (HCC)    Elevated bilirubin    Liver cirrhosis (HCC)    Colitis    Acute on chronic diastolic (congestive) heart failure (HCC)    PVC (premature ventricular contraction)    Systemic inflammatory response syndrome (SIRS) (HCC)    Acute on chronic heart failure with preserved ejection fraction (HFpEF) (HCC)    Hypothyroidism    Chronic respiratory failure with hypoxia (HCC)    BMI 50.0-59.9, adult (HCC)    Hypokalemia due to loss of potassium    DM (diabetes  mellitus), type 2 with complications (HCC)    Acute on chronic respiratory failure with hypoxia and hypercapnia (HCC)    OSA (obstructive sleep apnea)    COPD (chronic obstructive pulmonary disease) (HCC)    History of PSVT (paroxysmal supraventricular tachycardia)    Anemia    Hypomagnesemia    Anasarca         Current Outpatient Medications   Medication Sig Dispense Refill    diclofenac (VOLTAREN) 50 MG EC tablet Take 1 tablet by mouth daily      gabapentin (NEURONTIN) 100 MG capsule Take 1 capsule by mouth 3 times daily for 60 days. Max Daily Amount: 300 mg 90 capsule 1    Probiotic Product (ACIDOPHILUS PROBIOTIC) CAPS capsule Take 1 capsule by mouth daily 30 capsule 1  bumetanide (BUMEX) 1 MG tablet Take 1 tablet by mouth 2 times daily (Patient taking differently: Take 1 tablet by mouth three times a week Taking twice a day) 60 tablet 1    potassium chloride (KLOR-CON M) 20 MEQ extended release tablet Take 1 tablet by mouth daily 30 tablet 1    midodrine (PROAMATINE) 10 MG tablet Take 1 tablet by mouth 3 times daily 90 tablet 1    amiodarone (CORDARONE) 200 MG tablet Take 1 tablet by mouth daily      glipiZIDE (GLUCOTROL) 5 MG tablet Take 1 tablet by mouth 2 times daily (before meals)      mexiletine (MEXITIL) 150 MG capsule Take 1 capsule by mouth 3 times daily      albuterol sulfate HFA (PROVENTIL;VENTOLIN;PROAIR) 108 (90 Base) MCG/ACT inhaler Inhale 2 puffs into the lungs every 6 hours as needed      aspirin 81 MG chewable tablet Take 1 tablet by mouth daily      atorvastatin (LIPITOR) 10 MG tablet Take 1 tablet by mouth daily      citalopram (CELEXA) 40 MG tablet Take 1 tablet by mouth daily      fluticasone (FLONASE) 50 MCG/ACT nasal spray 2 sprays by Nasal route daily as needed      levothyroxine (SYNTHROID) 150 MCG tablet Take 1 tablet by mouth every morning (before breakfast)      miconazole (MICOTIN) 2 % powder Apply topically as needed      montelukast (SINGULAIR) 10 MG tablet Take 1 tablet by mouth  nightly      polyethylene glycol (GLYCOLAX) 17 GM/SCOOP powder Take 17 g by mouth daily as needed      sucralfate (CARAFATE) 1 GM tablet Take 1 tablet by mouth 3 times daily      traZODone (DESYREL) 100 MG tablet Take 1 tablet by mouth nightly       No current facility-administered medications for this visit.          Allergies   Allergen Reactions    Nitroglycerin      Other reaction(s): Unknown (comments)  hypotension    Hydrochlorothiazide Other (See Comments)     Reports 'kidneys dry up"     Tetanus Toxoids Swelling    Aloe Vera Rash          Past Medical History:   Diagnosis Date    (HFpEF) heart failure with preserved ejection fraction (HCC)     Anxiety and depression     Aortic valve replaced     S/p bovine aortic valve replacement.    Asthma     Chronic narcotic use     Chronic obstructive pulmonary disease (HCC)     Chronic pain     CKD (chronic kidney disease), stage III (HCC)     Baseline creatinine is 1.3-1.4 with GFR in the 40s.    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)     GERD (gastroesophageal reflux disease)     History of vascular access device 04/13/2021    4 FR Single PICC for LTABX: R cephalic vessell length 48 CM Max P leave @ 1 CM out; Arm circumferenc 40 CM    Hyperlipidemia     Hypothyroidism     Morbid obesity (HCC)     Neuropathy     Obstructive sleep apnea     Rhinitis            Past Surgical History:   Procedure Laterality Date    AORTIC VALVE  REPLACEMENT      Bovine bioprosthetic    COLONOSCOPY N/A 12/01/2020    COLONOSCOPY performed by Ian Malkin, MD at [facility] ENDOSCOPY    PACEMAKER           Family History   Problem Relation Age of Onset    Hypertension Mother     Hypertension Father        Social Connections: Not on file            Review of Systems:    12 point review of systems was performed. All negative except for HPI         Objective:     Vitals:    08/14/21 1022   BP: 100/62   Pulse: 78   Resp: 20   SpO2: 96%           Physical Exam:    General:  Alert and oriented, in no acute  distress; obese   Head:  Atraumatic, normocephalic   Eyes:  extraocular muscles intact   Neck:  Supple, normal range of motion   Lungs:  Clear to auscultation bilaterally, no wheezes/rales/rhonchi    Cardiovascular:  Regular rate and rhythm, normal S1-S2, no murmurs/rubs/gallops; ectopy    Abdomen:  Soft, nontender, nondistended, normoactive bowel sounds   Skin:  Intact, no rash   Extremities:, no clubbing, cyanosis, or edema   Musculoskeletal: normal range of motion; walks with cane    Neurological:  Alert and oriented, no focal neurologic deficits   Psychiatric:  Normal mood and affect       05/11/21    TRANSTHORACIC ECHOCARDIOGRAM (TTE) COMPLETE (CONTRAST/BUBBLE/3D PRN) 05/22/2021 10:53 PM (Final)    Narrative  This is a summary report. The complete report is available in the patient's medical record. If you cannot access the medical record, please contact the sending organization for a detailed fax or copy.      Contrast used: Definity.    Technical qualifiers: Echo study was technically difficult with poor endocardial visualization, limited due to patient's condition, limited due to patient tolerance and technically difficult due to patient's heart rhythm.    Left Ventricle: Normal left ventricular systolic function with a visually estimated EF of 60 - 65%. Left ventricle size is normal. Mildly increased wall thickness. Unable to assess wall motion.    Aortic Valve: Not well visualized. Bioprosthetic valve. AV mean gradient is 34 mmHg. LVOT:AV VTI Index is 0.38.  With a mean PG 34 mmHg, DVI 0.38 and an intermediate jet counter, there is possibly some valve stenosis but likely not significant.    Signed by: Laddie Aquas, MD on 05/22/2021 10:53 PM            Thank you for involving me in this patient's care and please call with further concerns or questions.         ________________________________________  Jillyn Ledger, NP  Cardiac Electrophysiology  [facility]  [address]  [place], [place] [address]  [phone]

## 2021-08-15 LAB — CBC
Hematocrit: 43.5 % (ref 35.0–47.0)
Hemoglobin: 13.2 g/dL (ref 11.5–16.0)
MCH: 27.7 PG (ref 26.0–34.0)
MCHC: 30.3 g/dL (ref 30.0–36.5)
MCV: 91.4 FL (ref 80.0–99.0)
MPV: 12.4 FL (ref 8.9–12.9)
Nucleated RBCs: 0 PER 100 WBC
Platelets: 83 10*3/uL — ABNORMAL LOW (ref 150–400)
RBC: 4.76 M/uL (ref 3.80–5.20)
RDW: 15.4 % — ABNORMAL HIGH (ref 11.5–14.5)
WBC: 7.1 10*3/uL (ref 3.6–11.0)
nRBC: 0 10*3/uL (ref 0.00–0.01)

## 2021-08-15 LAB — BASIC METABOLIC PANEL
Anion Gap: 7 mmol/L (ref 5–15)
BUN: 27 MG/DL — ABNORMAL HIGH (ref 6–20)
Bun/Cre Ratio: 18 (ref 12–20)
CO2: 28 mmol/L (ref 21–32)
Calcium: 9.1 MG/DL (ref 8.5–10.1)
Chloride: 107 mmol/L (ref 97–108)
Creatinine: 1.47 MG/DL — ABNORMAL HIGH (ref 0.55–1.02)
Est, Glom Filt Rate: 40 mL/min/{1.73_m2} — ABNORMAL LOW (ref >60)
Glucose: 87 mg/dL (ref 65–100)
Potassium: 4.5 mmol/L (ref 3.5–5.1)
Sodium: 142 mmol/L (ref 136–145)

## 2021-08-19 ENCOUNTER — Emergency Department: Admit: 2021-08-19 | Payer: MEDICARE | Primary: Family

## 2021-08-19 ENCOUNTER — Inpatient Hospital Stay
Admit: 2021-08-19 | Discharge: 2021-08-19 | Disposition: A | Discharge status: Home / Self Care | Payer: MEDICARE | Attending: Emergency Medicine

## 2021-08-19 DIAGNOSIS — K529 Noninfective gastroenteritis and colitis, unspecified: Secondary | ICD-10-CM

## 2021-08-19 LAB — CBC WITH AUTO DIFFERENTIAL
Absolute Immature Granulocyte: 0 10*3/uL (ref 0.00–0.04)
Basophils %: 0 % (ref 0–1)
Basophils Absolute: 0 10*3/uL (ref 0.0–0.1)
Eosinophils %: 2 % (ref 0–7)
Eosinophils Absolute: 0.2 10*3/uL (ref 0.0–0.4)
Hematocrit: 40.8 % (ref 35.0–47.0)
Hemoglobin: 12.9 g/dL (ref 11.5–16.0)
Immature Granulocytes: 0 % (ref 0.0–0.5)
Lymphocytes %: 29 % (ref 12–49)
Lymphocytes Absolute: 3.3 10*3/uL (ref 0.8–3.5)
MCH: 27.6 PG (ref 26.0–34.0)
MCHC: 31.6 g/dL (ref 30.0–36.5)
MCV: 87.2 FL (ref 80.0–99.0)
MPV: 11.7 FL (ref 8.9–12.9)
Monocytes %: 6 % (ref 5–13)
Monocytes Absolute: 0.7 10*3/uL (ref 0.0–1.0)
Neutrophils %: 63 % (ref 32–75)
Neutrophils Absolute: 7.1 10*3/uL (ref 1.8–8.0)
Nucleated RBCs: 0 PER 100 WBC
Platelets: 85 10*3/uL — ABNORMAL LOW (ref 150–400)
RBC: 4.68 M/uL (ref 3.80–5.20)
RDW: 15.7 % — ABNORMAL HIGH (ref 11.5–14.5)
WBC Comment: REACTIVE
WBC: 11.3 10*3/uL — ABNORMAL HIGH (ref 3.6–11.0)
nRBC: 0 10*3/uL (ref 0.00–0.01)

## 2021-08-19 LAB — URINALYSIS WITH MICROSCOPIC
Bilirubin Urine: NEGATIVE
Blood, Urine: NEGATIVE
Glucose, UA: NEGATIVE mg/dL
Ketones, Urine: NEGATIVE mg/dL
Nitrite, Urine: NEGATIVE
Protein, UA: NEGATIVE mg/dL
Specific Gravity, UA: 1.009 (ref 1.003–1.030)
Urobilinogen, Urine: 0.2 EU/dL (ref 0.2–1.0)
pH, Urine: 5 (ref 5.0–8.0)

## 2021-08-19 LAB — COMPREHENSIVE METABOLIC PANEL
ALT: 15 U/L (ref 12–78)
AST: 14 U/L — ABNORMAL LOW (ref 15–37)
Albumin/Globulin Ratio: 1 — ABNORMAL LOW (ref 1.1–2.2)
Albumin: 3.6 g/dL (ref 3.5–5.0)
Alk Phosphatase: 86 U/L (ref 45–117)
Anion Gap: 5 mmol/L (ref 5–15)
BUN: 24 MG/DL — ABNORMAL HIGH (ref 6–20)
Bun/Cre Ratio: 15 (ref 12–20)
CO2: 32 mmol/L (ref 21–32)
Calcium: 9.4 MG/DL (ref 8.5–10.1)
Chloride: 103 mmol/L (ref 97–108)
Creatinine: 1.55 MG/DL — ABNORMAL HIGH (ref 0.55–1.02)
Est, Glom Filt Rate: 37 mL/min/{1.73_m2} — ABNORMAL LOW (ref >60)
Globulin: 3.7 g/dL (ref 2.0–4.0)
Glucose: 138 mg/dL — ABNORMAL HIGH (ref 65–100)
Potassium: 3.8 mmol/L (ref 3.5–5.1)
Sodium: 140 mmol/L (ref 136–145)
Total Bilirubin: 3.7 MG/DL — ABNORMAL HIGH (ref 0.2–1.0)
Total Protein: 7.3 g/dL (ref 6.4–8.2)

## 2021-08-19 LAB — EXTRA TUBES HOLD

## 2021-08-19 LAB — URINE CULTURE HOLD SAMPLE

## 2021-08-19 MED ORDER — IOPAMIDOL 76 % IV SOLN
76 % | Freq: Once | INTRAVENOUS | Status: AC | PRN
Start: 2021-08-19 — End: 2021-08-19
  Administered 2021-08-19: 15:00:00 100 mL via INTRAVENOUS

## 2021-08-19 MED ORDER — ONDANSETRON HCL 4 MG/2ML IJ SOLN
4 MG/2ML | INTRAMUSCULAR | Status: DC | PRN
Start: 2021-08-19 — End: 2021-08-19
  Administered 2021-08-19: 14:00:00 4 mg via INTRAVENOUS

## 2021-08-19 MED ORDER — ACETAMINOPHEN 500 MG PO TABS
500 MG | ORAL_TABLET | Freq: Four times a day (QID) | ORAL | 0 refills | Status: DC | PRN
Start: 2021-08-19 — End: 2021-08-19

## 2021-08-19 MED ORDER — AMOXICILLIN-POT CLAVULANATE 875-125 MG PO TABS
875-125 MG | ORAL_TABLET | Freq: Two times a day (BID) | ORAL | 0 refills | Status: AC
Start: 2021-08-19 — End: 2021-08-29

## 2021-08-19 MED ORDER — OXYCODONE HCL 5 MG PO TABS
5 MG | ORAL_TABLET | Freq: Four times a day (QID) | ORAL | 0 refills | Status: DC | PRN
Start: 2021-08-19 — End: 2021-08-19

## 2021-08-19 MED ORDER — SODIUM CHLORIDE 0.9 % IV BOLUS
0.9 % | Freq: Once | INTRAVENOUS | Status: AC
Start: 2021-08-19 — End: 2021-08-19
  Administered 2021-08-19: 16:00:00 1000 mL via INTRAVENOUS

## 2021-08-19 MED ORDER — FENTANYL CITRATE (PF) 100 MCG/2ML IJ SOLN
100 MCG/2ML | INTRAMUSCULAR | Status: DC | PRN
Start: 2021-08-19 — End: 2021-08-19
  Administered 2021-08-19: 15:00:00 50 ug via INTRAVENOUS

## 2021-08-19 MED FILL — FENTANYL CITRATE (PF) 100 MCG/2ML IJ SOLN: 100 MCG/2ML | INTRAMUSCULAR | Qty: 2

## 2021-08-19 MED FILL — SODIUM CHLORIDE 0.9 % IV SOLN: 0.9 % | INTRAVENOUS | Qty: 1000

## 2021-08-19 MED FILL — ONDANSETRON HCL 4 MG/2ML IJ SOLN: 4 MG/2ML | INTRAMUSCULAR | Qty: 2

## 2021-08-19 NOTE — ED Provider Notes (Signed)
[facility] EMERGENCY DEPT  EMERGENCY DEPARTMENT ENCOUNTER      Pt Name: Deborah [name]  MRN: [id number]  Birthdate 1955/01/30  Date of evaluation: 08/19/2021  Provider: Truett Mainland, MD    CHIEF COMPLAINT       Chief Complaint   Patient presents with    Abdominal Pain         HISTORY OF PRESENT ILLNESS    66 year old female presents with feeling unwell at dinner 2 nights ago and having sharp right-sided abdominal pain that has since radiated into her right lower quadrant.  No vomiting, has persistent nausea, no fever, no back pain.  She did notice that her blood pressure was running lower than normal yesterday.          Review of External Medical Records:     Nursing Notes were reviewed.    REVIEW OF SYSTEMS       Review of Systems    Except as noted above the remainder of the review of systems was reviewed and negative.       PAST MEDICAL HISTORY     Past Medical History:   Diagnosis Date    (HFpEF) heart failure with preserved ejection fraction (HCC)     Anxiety and depression     Aortic valve replaced     S/p bovine aortic valve replacement.    Asthma     Chronic narcotic use     Chronic obstructive pulmonary disease (HCC)     Chronic pain     CKD (chronic kidney disease), stage III (HCC)     Baseline creatinine is 1.3-1.4 with GFR in the 40s.    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)     GERD (gastroesophageal reflux disease)     History of vascular access device 04/13/2021    4 FR Single PICC for LTABX: R cephalic vessell length 48 CM Max P leave @ 1 CM out; Arm circumferenc 40 CM    Hyperlipidemia     Hypothyroidism     Morbid obesity (HCC)     Neuropathy     Obstructive sleep apnea     Rhinitis          SURGICAL HISTORY       Past Surgical History:   Procedure Laterality Date    AORTIC VALVE REPLACEMENT      Bovine bioprosthetic    COLONOSCOPY N/A 12/01/2020    COLONOSCOPY performed by Ian Malkin, MD at [facility] ENDOSCOPY    PACEMAKER           CURRENT MEDICATIONS       Discharge Medication List as of 08/19/2021 12:36 PM         CONTINUE these medications which have NOT CHANGED    Details   diclofenac (VOLTAREN) 50 MG EC tablet Take 1 tablet by mouth dailyHistorical Med      gabapentin (NEURONTIN) 100 MG capsule Take 1 capsule by mouth 3 times daily for 60 days. Max Daily Amount: 300 mg, Disp-90 capsule, R-1Normal      Probiotic Product (ACIDOPHILUS PROBIOTIC) CAPS capsule Take 1 capsule by mouth daily, Disp-30 capsule, R-1Normal      bumetanide (BUMEX) 1 MG tablet Take 1 tablet by mouth 2 times daily, Disp-60 tablet, R-1Normal      potassium chloride (KLOR-CON M) 20 MEQ extended release tablet Take 1 tablet by mouth daily, Disp-30 tablet, R-1Normal      midodrine (PROAMATINE) 10 MG tablet Take 1 tablet by mouth 3 times daily, Disp-90  tablet, R-1Normal      amiodarone (CORDARONE) 200 MG tablet Take 1 tablet by mouth dailyHistorical Med      glipiZIDE (GLUCOTROL) 5 MG tablet Take 1 tablet by mouth 2 times daily (before meals)Historical Med      mexiletine (MEXITIL) 150 MG capsule Take 1 capsule by mouth 3 times dailyHistorical Med      albuterol sulfate HFA (PROVENTIL;VENTOLIN;PROAIR) 108 (90 Base) MCG/ACT inhaler Inhale 2 puffs into the lungs every 6 hours as neededHistorical Med      aspirin 81 MG chewable tablet Take 1 tablet by mouth dailyHistorical Med      atorvastatin (LIPITOR) 10 MG tablet Take 1 tablet by mouth dailyHistorical Med      citalopram (CELEXA) 40 MG tablet Take 1 tablet by mouth dailyHistorical Med      fluticasone (FLONASE) 50 MCG/ACT nasal spray 2 sprays by Nasal route daily as neededHistorical Med      levothyroxine (SYNTHROID) 150 MCG tablet Take 1 tablet by mouth every morning (before breakfast)Historical Med      miconazole (MICOTIN) 2 % powder Apply topically as needed, Topical, PRN Starting Tue 04/24/2021, Historical Med      montelukast (SINGULAIR) 10 MG tablet Take 1 tablet by mouth nightlyHistorical Med      polyethylene glycol (GLYCOLAX) 17 GM/SCOOP powder Take 17 g by mouth daily as neededHistorical  Med      sucralfate (CARAFATE) 1 GM tablet Take 1 tablet by mouth 3 times dailyHistorical Med      traZODone (DESYREL) 100 MG tablet Take 1 tablet by mouth nightlyHistorical Med             ALLERGIES     Nitroglycerin, Hydrochlorothiazide, Tetanus toxoids, and Aloe vera    FAMILY HISTORY       Family History   Problem Relation Age of Onset    Hypertension Mother     Hypertension Father           SOCIAL HISTORY       Social History     Socioeconomic History    Marital status: Widowed   Tobacco Use    Smoking status: Former     Packs/day: 1.00     Types: Cigarettes     Quit date: 03/21/2010     Years since quitting: 11.4    Smokeless tobacco: Never   Vaping Use    Vaping Use: Never used   Substance and Sexual Activity    Alcohol use: Not Currently    Drug use: Never           PHYSICAL EXAM       ED Triage Vitals [08/19/21 0947]   BP Temp Temp Source Pulse Respirations SpO2 Height Weight - Scale   124/64 98.2 F (36.8 C) Oral 97 18 92 % 5\' 7"  (1.702 m) 300 lb (136.1 kg)       Body mass index is 46.99 kg/m.    Physical Exam  Vitals and nursing note reviewed.   Constitutional:       Appearance: Normal appearance. She is well-developed. She is obese.   HENT:      Head: Normocephalic and atraumatic.   Cardiovascular:      Rate and Rhythm: Normal rate and regular rhythm.   Pulmonary:      Effort: Pulmonary effort is normal.   Abdominal:      Palpations: Abdomen is soft.      Tenderness: There is abdominal tenderness in the right lower quadrant. There is guarding and  rebound. Positive signs include McBurney's sign. Negative signs include Murphy's sign.   Skin:     General: Skin is warm and dry.   Neurological:      Mental Status: She is alert.       DIAGNOSTIC RESULTS     EKG: All EKG's are interpreted by the Emergency Department Physician who either signs or Co-signs this chart in the absence of a cardiologist.        RADIOLOGY:   Plain radiographic images, CT and ultrasound are visualized and preliminarily interpreted by  the emergency physician as documented in clinical course.    Interpretation per the Radiologist below, if available at the time of this note:    CT ABDOMEN PELVIS W IV CONTRAST Additional Contrast? None   Final Result      1. Moderate mural thickening and pericolonic fat stranding of the ascending   colon consistent with nonspecific colitis. No evidence for appendicitis. Other   colonic segments appear normal. No free peritoneal air or fluid.   2. Other findings are unchanged including mild lobular contour of liver,   splenomegaly, hypoattenuating 13 mm lesion of the uncinate process of the   pancreas, 13 mm left adrenal nodule, right nephrolithiasis, and periumbilical   hernia.           LABS:  Labs Reviewed   CBC WITH AUTO DIFFERENTIAL - Abnormal; Notable for the following components:       Result Value    WBC 11.3 (*)     RDW 15.7 (*)     Platelets 85 (*)     All other components within normal limits   URINALYSIS WITH MICROSCOPIC - Abnormal; Notable for the following components:    Leukocyte Esterase, Urine TRACE (*)     Epithelial Cells UA MANY (*)     BACTERIA, URINE 1+ (*)     All other components within normal limits   COMPREHENSIVE METABOLIC PANEL - Abnormal; Notable for the following components:    Glucose 138 (*)     BUN 24 (*)     Creatinine 1.55 (*)     Est, Glom Filt Rate 37 (*)     Total Bilirubin 3.7 (*)     AST 14 (*)     Albumin/Globulin Ratio 1.0 (*)     All other components within normal limits   URINE CULTURE HOLD SAMPLE   EXTRA TUBES HOLD       All other labs were within normal range or not returned as of this dictation.    EMERGENCY DEPARTMENT COURSE and DIFFERENTIAL DIAGNOSIS/MDM:   Vitals:    Vitals:    08/19/21 0947 08/19/21 1030 08/19/21 1124   BP: 124/64  (!) 103/44   Pulse: 97  80   Resp: 18 16 17    Temp: 98.2 F (36.8 C)     TempSrc: Oral     SpO2: 92%  94%   Weight: 136.1 kg (300 lb)     Height: 1.702 m (5\' 7" )             Medical Decision Making  66 year old female presents with vague  abdominal pain that has localized to the right lower quadrant.  She is very high risk for appendicitis given her physical exam features.  History of cholecystectomy previously.  Differential diagnosis considerations include kidney stone, tubo-ovarian pathology, colonic constipation, atypical diverticulitis.    CT shows evidence of ascending colitis which would explain location of pain. Incidental nephrolithiasis does not appear obstructive and  less likely contributing to pain symptoms. No evidence of appendicitis. No abscess or other surgical indication. Plan for pain control with home voltaren and empiric antibiotics for possible infectious cause. No signs of vascular compromise. Plan to follow up with PCP as needed and return precautions discussed for worsening or new concerning symptoms.     Problems Addressed:  Acute colitis: acute illness or injury    Amount and/or Complexity of Data Reviewed  Independent Historian:      Details: daughter  Labs: ordered. Decision-making details documented in ED Course.  Radiology: ordered and independent interpretation performed. Decision-making details documented in ED Course.    Risk  Prescription drug management.  Parenteral controlled substances.            REASSESSMENT            CONSULTS:  None    PROCEDURES:  Unless otherwise noted below, none     Procedures      FINAL IMPRESSION      1. Acute colitis          DISPOSITION/PLAN   DISPOSITION Decision To Discharge 08/19/2021 12:36:48 PM      PATIENT REFERRED TO:  [name], APRN - NP  [address]  [place] [address]  [phone]    Schedule an appointment as soon as possible for a visit   As needed, for re-evaluation    [facility] EMERGENCY DEPT  [address] [place]  [place] [address]  [phone]  Go to   If symptoms worsen      DISCHARGE MEDICATIONS:  Discharge Medication List as of 08/19/2021 12:36 PM        START taking these medications    Details   amoxicillin-clavulanate (AUGMENTIN)  875-125 MG per tablet Take 1 tablet by mouth 2 times daily for 10 days, Disp-20 tablet, R-0Normal      oxyCODONE (ROXICODONE) 5 MG immediate release tablet Take 1 tablet by mouth every 6 hours as needed for Pain for up to 3 days. Intended supply: 3 days. Take lowest dose possible to manage pain Max Daily Amount: 20 mg, Disp-6 tablet, R-0Normal      acetaminophen (TYLENOL) 500 MG tablet Take 2 tablets by mouth every 6 hours as needed for Pain or Fever, Disp-40 tablet, R-0Normal               (Please note that portions of this note were completed with a voice recognition program.  Efforts were made to edit the dictations but occasionally words are mis-transcribed.)    Truett Mainland, MD (electronically signed)  Emergency Attending Physician     Truett Mainland, MD  08/19/21 [phone]

## 2021-08-19 NOTE — ED Notes (Signed)
Pt placed on BP and pulse ox. Call bell in reach. SR up x 2.     Glendale Chard, RN  08/19/21 1028

## 2021-08-19 NOTE — ED Triage Notes (Signed)
Patient arrives to the ED via POV with complaints of R flank and RLQ pain that started two nights ago.    Patient reports pain started while eating dinner.     Denies back pain, NV.

## 2021-08-27 MED ORDER — AMIODARONE HCL 200 MG PO TABS
200 MG | ORAL_TABLET | Freq: Every day | ORAL | 3 refills | Status: DC
Start: 2021-08-27 — End: 2021-09-27

## 2021-08-27 MED ORDER — MEXILETINE HCL 150 MG PO CAPS
150 MG | ORAL_CAPSULE | Freq: Three times a day (TID) | ORAL | 2 refills | Status: DC
Start: 2021-08-27 — End: 2021-10-05

## 2021-08-27 NOTE — Telephone Encounter (Signed)
Request for mexiletine 150 mg, amiodarone 200 mg.  Last office visit 08/14/21, next office visit 09/11/21. Refills per verbal order from Gabriel Rung NP.

## 2021-08-27 NOTE — Telephone Encounter (Signed)
Pt called for a refill for mexiletine 150mg  and amiodarone 200mg . She's out of both as of today.        [facility] [phone]

## 2021-08-30 ENCOUNTER — Inpatient Hospital Stay
Admission: EM | Admit: 2021-08-30 | Discharge: 2021-08-30 | Disposition: A | Discharge status: Home / Self Care | Payer: MEDICARE | Admitting: Clinical Cardiac Electrophysiology

## 2021-08-30 DIAGNOSIS — Z8679 Personal history of other diseases of the circulatory system: Secondary | ICD-10-CM

## 2021-08-30 DIAGNOSIS — I493 Ventricular premature depolarization: Secondary | ICD-10-CM

## 2021-08-30 DIAGNOSIS — Z9889 Other specified postprocedural states: Secondary | ICD-10-CM

## 2021-08-30 LAB — POCT GLUCOSE: POC Glucose: 111 mg/dL (ref 65–117)

## 2021-08-30 LAB — POCT ACTIVATED CLOTTING TIME
Activated Clotting Time: 143 SECS — ABNORMAL HIGH (ref 79–138)
Activated Clotting Time: 263 SECS — ABNORMAL HIGH (ref 79–138)
Activated Clotting Time: 281 SECS — ABNORMAL HIGH (ref 79–138)

## 2021-08-30 MED ORDER — NORMAL SALINE FLUSH 0.9 % IV SOLN
0.9 % | INTRAVENOUS | Status: DC | PRN
Start: 2021-08-30 — End: 2021-08-30

## 2021-08-30 MED ORDER — CEFAZOLIN SODIUM 1 G IJ SOLR
1 g | INTRAMUSCULAR | Status: AC
Start: 2021-08-30 — End: ?

## 2021-08-30 MED ORDER — CEFAZOLIN SODIUM 1 G IJ SOLR
1 g | INTRAMUSCULAR | Status: DC | PRN
Start: 2021-08-30 — End: 2021-08-30
  Administered 2021-08-30: 18:00:00 3 via INTRAVENOUS

## 2021-08-30 MED ORDER — PROTAMINE SULFATE 10 MG/ML IV SOLN
10 MG/ML | INTRAVENOUS | Status: AC
Start: 2021-08-30 — End: ?

## 2021-08-30 MED ORDER — HYDROCODONE-ACETAMINOPHEN 5-325 MG PO TABS
5-325 MG | Freq: Four times a day (QID) | ORAL | Status: DC | PRN
Start: 2021-08-30 — End: 2021-08-30
  Administered 2021-08-30: 21:00:00 1 via ORAL

## 2021-08-30 MED ORDER — HEPARIN (PORCINE) IN NACL 1000-0.9 UT/500ML-% IV SOLN
INTRAVENOUS | Status: AC
Start: 2021-08-30 — End: ?

## 2021-08-30 MED ORDER — KETAMINE HCL 50 MG/5ML IJ SOSY
50 MG/5ML | INTRAMUSCULAR | Status: AC
Start: 2021-08-30 — End: ?

## 2021-08-30 MED ORDER — SODIUM CHLORIDE 0.9 % IV SOLN
0.9 % | INTRAVENOUS | Status: DC | PRN
Start: 2021-08-30 — End: 2021-08-30
  Administered 2021-08-30 (×2): via INTRAVENOUS

## 2021-08-30 MED ORDER — HYDROCODONE-ACETAMINOPHEN 5-325 MG PO TABS
5-325 MG | ORAL | Status: AC
Start: 2021-08-30 — End: ?

## 2021-08-30 MED ORDER — MIDAZOLAM HCL 2 MG/2ML IJ SOLN
2 MG/ML | INTRAMUSCULAR | Status: DC | PRN
Start: 2021-08-30 — End: 2021-08-30
  Administered 2021-08-30: 19:00:00 .5 via INTRAVENOUS
  Administered 2021-08-30: 18:00:00 1 via INTRAVENOUS
  Administered 2021-08-30: 18:00:00 .5 via INTRAVENOUS

## 2021-08-30 MED ORDER — HEPARIN SOD (PORCINE) IN D5W 100 UNIT/ML IV SOLN
100 UNIT/ML | INTRAVENOUS | Status: DC | PRN
Start: 2021-08-30 — End: 2021-08-30
  Administered 2021-08-30: 19:00:00 18 via INTRAVENOUS

## 2021-08-30 MED ORDER — ACETAMINOPHEN 325 MG PO TABS
325 MG | ORAL | Status: DC | PRN
Start: 2021-08-30 — End: 2021-08-30

## 2021-08-30 MED ORDER — STERILE WATER FOR INJECTION IJ SOLN
INTRAMUSCULAR | Status: AC
Start: 2021-08-30 — End: ?

## 2021-08-30 MED ORDER — MIDAZOLAM HCL 2 MG/2ML IJ SOLN
2 MG/ML | INTRAMUSCULAR | Status: AC
Start: 2021-08-30 — End: ?

## 2021-08-30 MED ORDER — FENTANYL CITRATE (PF) 250 MCG/5ML IJ SOLN
250 MCG/5ML | INTRAMUSCULAR | Status: DC | PRN
Start: 2021-08-30 — End: 2021-08-30
  Administered 2021-08-30 (×2): 50 via INTRAVENOUS

## 2021-08-30 MED ORDER — KETAMINE HCL 50 MG/5ML IJ SOSY
50 MG/5ML | INTRAMUSCULAR | Status: DC | PRN
Start: 2021-08-30 — End: 2021-08-30
  Administered 2021-08-30: 18:00:00 20 via INTRAVENOUS
  Administered 2021-08-30: 19:00:00 10 via INTRAVENOUS

## 2021-08-30 MED ORDER — FAMOTIDINE (PF) 20 MG/2ML IV SOLN
20 MG/2ML | INTRAVENOUS | Status: DC | PRN
Start: 2021-08-30 — End: 2021-08-30
  Administered 2021-08-30: 18:00:00 20 via INTRAVENOUS

## 2021-08-30 MED ORDER — IOPAMIDOL 76 % IV SOLN
76 % | INTRAVENOUS | Status: DC | PRN
Start: 2021-08-30 — End: 2021-08-30
  Administered 2021-08-30: 19:00:00 10 via INTRAVENOUS

## 2021-08-30 MED ORDER — HEPARIN (PORCINE) IN NACL 1000-0.9 UT/500ML-% IV SOLN
INTRAVENOUS | Status: DC | PRN
Start: 2021-08-30 — End: 2021-08-30
  Administered 2021-08-30: 18:00:00 10000 via INTRAVENOUS

## 2021-08-30 MED ORDER — PROPOFOL 100 MG/10ML IV EMUL
100 MG/10ML | INTRAVENOUS | Status: DC | PRN
Start: 2021-08-30 — End: 2021-08-30
  Administered 2021-08-30: 18:00:00 30 via INTRAVENOUS

## 2021-08-30 MED ORDER — PROTAMINE SULFATE 10 MG/ML IV SOLN
10 MG/ML | INTRAVENOUS | Status: DC | PRN
Start: 2021-08-30 — End: 2021-08-30
  Administered 2021-08-30: 19:00:00 50 via INTRAVENOUS

## 2021-08-30 MED ORDER — PROPOFOL 1000 MG/100ML IV EMUL
1000 MG/100ML | INTRAVENOUS | Status: AC
Start: 2021-08-30 — End: ?

## 2021-08-30 MED ORDER — ONDANSETRON HCL 4 MG/2ML IJ SOLN
4 MG/2ML | Freq: Four times a day (QID) | INTRAMUSCULAR | Status: DC | PRN
Start: 2021-08-30 — End: 2021-08-30

## 2021-08-30 MED ORDER — GLYCOPYRROLATE 0.2 MG/ML IJ SOLN
0.2 MG/ML | INTRAMUSCULAR | Status: DC | PRN
Start: 2021-08-30 — End: 2021-08-30
  Administered 2021-08-30 (×2): .1 via INTRAVENOUS

## 2021-08-30 MED ORDER — ISOPROTERENOL HCL 0.2 MG/ML IJ SOLN
0.2 MG/ML | INTRAMUSCULAR | Status: AC
Start: 2021-08-30 — End: ?

## 2021-08-30 MED ORDER — IOPAMIDOL 76 % IV SOLN
76 % | INTRAVENOUS | Status: AC
Start: 2021-08-30 — End: ?

## 2021-08-30 MED ORDER — SODIUM CHLORIDE 0.9 % IV SOLN
0.9 % | INTRAVENOUS | Status: DC | PRN
Start: 2021-08-30 — End: 2021-08-30
  Administered 2021-08-30: 18:00:00 2 via INTRAVENOUS

## 2021-08-30 MED ORDER — HEPARIN SOD (PORCINE) IN D5W 100 UNIT/ML IV SOLN
100 UNIT/ML | INTRAVENOUS | Status: AC
Start: 2021-08-30 — End: ?

## 2021-08-30 MED ORDER — PHENYLEPHRINE HCL 10 MG/ML SOLN (MIXTURES ONLY)
10 MG/ML | INTRAVENOUS | Status: DC | PRN
Start: 2021-08-30 — End: 2021-08-30
  Administered 2021-08-30: 18:00:00 25 via INTRAVENOUS

## 2021-08-30 MED ORDER — NORMAL SALINE FLUSH 0.9 % IV SOLN
0.9 % | Freq: Two times a day (BID) | INTRAVENOUS | Status: DC
Start: 2021-08-30 — End: 2021-08-30

## 2021-08-30 MED ORDER — LIDOCAINE HCL 1 % IJ SOLN
1 % | INTRAMUSCULAR | Status: AC
Start: 2021-08-30 — End: ?

## 2021-08-30 MED ORDER — LIDOCAINE HCL 1% INJ (MIXTURES ONLY)
1 % | INTRAMUSCULAR | Status: DC | PRN
Start: 2021-08-30 — End: 2021-08-30
  Administered 2021-08-30: 18:00:00 30 via INTRADERMAL

## 2021-08-30 MED ORDER — FENTANYL CITRATE (PF) 250 MCG/5ML IJ SOLN
250 MCG/5ML | INTRAMUSCULAR | Status: AC
Start: 2021-08-30 — End: ?

## 2021-08-30 MED FILL — MIDAZOLAM HCL 2 MG/2ML IJ SOLN: 2 MG/ML | INTRAMUSCULAR | Qty: 2

## 2021-08-30 MED FILL — LIDOCAINE HCL 1 % IJ SOLN: 1 % | INTRAMUSCULAR | Qty: 40

## 2021-08-30 MED FILL — ISOVUE-370 76 % IV SOLN: 76 % | INTRAVENOUS | Qty: 100

## 2021-08-30 MED FILL — CEFAZOLIN SODIUM 1 G IJ SOLR: 1 g | INTRAMUSCULAR | Qty: 1000

## 2021-08-30 MED FILL — PROTAMINE SULFATE 10 MG/ML IV SOLN: 10 MG/ML | INTRAVENOUS | Qty: 5

## 2021-08-30 MED FILL — HYDROCODONE-ACETAMINOPHEN 5-325 MG PO TABS: 5-325 MG | ORAL | Qty: 1

## 2021-08-30 MED FILL — PROPOFOL 1000 MG/100ML IV EMUL: 1000 MG/100ML | INTRAVENOUS | Qty: 200

## 2021-08-30 MED FILL — KETAMINE HCL 50 MG/5ML IJ SOSY: 50 MG/5ML | INTRAMUSCULAR | Qty: 5

## 2021-08-30 MED FILL — ISUPREL 0.2 MG/ML IJ SOLN: 0.2 MG/ML | INTRAMUSCULAR | Qty: 5

## 2021-08-30 MED FILL — HEPARIN (PORCINE) IN NACL 1000-0.9 UT/500ML-% IV SOLN: INTRAVENOUS | Qty: 500

## 2021-08-30 MED FILL — STERILE WATER FOR INJECTION IJ SOLN: INTRAMUSCULAR | Qty: 10

## 2021-08-30 MED FILL — FENTANYL CITRATE (PF) 250 MCG/5ML IJ SOLN: 250 MCG/5ML | INTRAMUSCULAR | Qty: 5

## 2021-08-30 MED FILL — HEPARIN SOD (PORCINE) IN D5W 100 UNIT/ML IV SOLN: 100 UNIT/ML | INTRAVENOUS | Qty: 250

## 2021-08-30 MED FILL — NORMAL SALINE FLUSH 0.9 % IV SOLN: 0.9 % | INTRAVENOUS | Qty: 40

## 2021-08-30 MED FILL — HEPARIN (PORCINE) IN NACL 1000-0.9 UT/500ML-% IV SOLN: INTRAVENOUS | Qty: 1000

## 2021-08-30 NOTE — Anesthesia Post-Procedure Evaluation (Signed)
Department of Anesthesiology  Postprocedure Note    Patient: Deborah [name]  MRN: [id number]  Birthdate: 1956-01-13  Date of evaluation: 08/30/2021      Procedure Summary     Date: 08/30/21 Room / Location: [facility] CATH LAB 3 / [facility] CARDIAC CATH LAB    Anesthesia Start: 5573 Anesthesia Stop: 2202    Procedures:       Ablation PVC      Ultrasound guided vascular access      Intracardiac echocardiogram      Drug stimulation      Transeptal puncture      Ep 3d mapping Diagnosis:       PVC (premature ventricular contraction)      (PVC (premature ventricular contraction) [I49.3])    Providers: Shelle Iron, MD Responsible Provider: Hope Pigeon Jacqulynn Shappell, DO    Anesthesia Type: MAC ASA Status: 4          Anesthesia Type: No value filed.    Aldrete Phase I: Aldrete Score: 10    Aldrete Phase II:        Anesthesia Post Evaluation    Patient location during evaluation: PACU  Patient participation: complete - patient participated  Level of consciousness: awake and alert  Pain score: 0  Airway patency: patent  Nausea & Vomiting: no nausea  Complications: no  Cardiovascular status: hemodynamically stable  Respiratory status: acceptable  Hydration status: euvolemic  Comments: Seen, no complaints   Pain management: adequate

## 2021-08-30 NOTE — Discharge Instructions (Addendum)
Patient Instructions Post-EP Study and Ablation    Resume medications as usual  Take tylenol 2 tablets three times a day as needed for groin pain    1.  No heavy lifting or exercises for 1 week.  This includes the following:  Do not push or move furniture, jog or run    2.  Do not drive for 3 days.    3.  Call Dr. Loma Newton at [phone] if you experience any of the following symptoms:  Dizziness, lightheadedness, fainting spells  Lack of energy  Shortness of breath  Rapid heart rate  Chest or muscle twitches  Blurry vision, double vision, weakness, numbness  Nausea, vomiting  Fever  Bleeding in the stool, black stool  Leg swelling, pain    4.  Follow-up with Dr. Wyatt Haste NP on 09/11/2021 as noted below.      Future Appointments   Date Time Provider Department Center   09/11/2021 10:20 AM PACEMAKER, [facility] AM[facility]   09/11/2021 10:40 AM Jillyn Ledger, APRN - NP [facility]   10/10/2021 11:40 AM Candyce Churn, DO [facility] AMB   03/13/2022  2:00 PM PACEMAKER, [facility] AM[facility]   03/13/2022  2:20 PM Thurston Pounds, MD [facility] AM[facility]       Juliet Rude, M.D.  Electrophysiology/Cardiology  [facility] Cardiology  [address]                        [place], [place] [address]                               [phone]

## 2021-08-30 NOTE — Progress Notes (Signed)
EP LAB to Recovery Room Report    Procedure:  PVC ablation    MD: M. Ngo,MD  Pre-procedure heart rhythm: Normal Sinus  Rhythm   Verbal Report given to Recovery Nurse on patient being transferred to Recovery Room for routine post-op. Patient stable upon transfer to RR.  Pt had MAC sedation with Anesthesia team, who managed MAR, vitals, and airway. Vitals, mental status, MAR, procedural summary discussed with recovery RN.     PVC's induced with Isuprel and pacing maneuvers. Ablation using RF performed; see op notes.    Post-procedure heart rhythm: Normal Sinus  Rhythm      Sheaths:    Right femoral  8.5 fr arterial sheath removed at 1530 pm, closed with perclose.  Right femoral vein 8.5 fr venous sheath removed at 1536 pm, closed with perclose.    Left femoral vein 11 fr sheath removed at 1535 pm, closed with perclose.

## 2021-08-30 NOTE — Progress Notes (Signed)
Cardiac Cath Lab Procedure Area Arrival Note:    Deborah [name] arrived to Cardiac Cath Lab, Procedure Area. Patient identifiers verified with NAME and DATE OF BIRTH. Procedure verified with patient. Consent forms verified. Allergies verified. Patient informed of procedure and plan of care. Questions answered with review. Patient voiced understanding of procedure and plan of care.    Patient on cardiac monitor, non-invasive blood pressure, SPO2 monitor. On stretcher to room 3 for EP study with drug stim and possible PVC ablation.; patient under care of CRNA. Patient is A&Ox 4. Patient reports no compaints.     Patient medicated during procedure with orders obtained and verified by Dr. Loma Newton.    Refer to patients Cardiac Cath Lab PROCEDURE REPORT for vital signs, assessment, status, and response during procedure, printed at end of case. Printed report on chart or scanned into chart.

## 2021-08-30 NOTE — Progress Notes (Signed)
1800: provided box lunch    1850: Ambulated patient to the bathroom with a steady gait, voided without difficulty. Patient denies chest pain, shortness of breath, or dizziness. Returned to Doctor, general practice. Vital signs stable.     1900: Procedural site unchanged. No bleeding, right groin hematoma remains soft, very bruised     Assisted patient in dressing/Patient dressed self.     1845: Educated patient about their sedation precautions such as not driving, operating any machinery, or signing legal documents 24 hours post procedure.     1845: Reviewed discharge instructions, including medications and site care using the teach back method.  Answered all questions. Verbalized understanding.     1910: Removed peripheral IV.    1915: Escorted to discharge area in a wheelchair with all of their belongings including cane and cell phone    Patient's son present to take patient home. Reviewed discharge instructions with patients' son, they verbalized understanding.

## 2021-08-30 NOTE — Interval H&P Note (Signed)
Update History & Physical    The patient's History and Physical of August 14, 2021 was reviewed with the patient and I examined the patient. There was no change. The surgical site was confirmed by the patient and me.     Plan: The risks, benefits, expected outcome, and alternative to the recommended procedure have been discussed with the patient. Patient understands and wants to proceed with the procedure.     Electronically signed by Thurston Pounds, MD on 08/30/2021 at 1:10 PM

## 2021-08-30 NOTE — Progress Notes (Addendum)
TRANSFER - IN REPORT:    Verbal report received from CRNA and Glenetta Hew on Danice Dippolito  being received from EP lab for routine post op. Report consisted of patient's Situation, Background, Assessment and Recommendations(SBAR). Information from the following report(s) procedure summary, MAR was reviewed with the receiving clinician. Opportunity for questions and clarification was provided. Assessment completed upon patient's arrival to Cardiac Cath Lab RECOVERY AREA and care assumed.       Cardiac Cath Lab Recovery Arrival Note:    Ricci Paff arrived to CCL recovery area.  Patient procedure= PVC ablation. Patient on cardiac monitor, non-invasive blood pressure, SPO2 monitor. On O2 @ 2 lpm via NC.  IV  of NS on pump at 25 ml/hr. Patient status doing well without problems. Patient is A&Ox 3. Patient reports no complaints.    PROCEDURE SITE CHECK:    Procedure site:Right groin hematoma marked, site soft with firm edges. Left groin dressing without any bleeding and perclosed, zero pain/discomfort reported at procedure site.     No change in patient status. Continue to monitor patient and status.    1800: Right groin hematoma unchanged in size, still soft. Patient sat up to 30 degrees and boxed lunch provided.

## 2021-08-30 NOTE — Progress Notes (Signed)
Cardiac Cath Lab Recovery Arrival Note:      Deborah [name] arrived to Cardiac Cath Lab, Recovery Area. Staff introduced to patient. Patient identifiers verified with NAME and DATE OF BIRTH. Procedure verified with patient. Consent forms reviewed and signed by patient or authorized representative and verified. Allergies verified. Patient informed of procedure and plan of care. Questions answered with review. Patient prepped for procedure, per orders from physician, prior to arrival.    Patient on cardiac monitor, non-invasive blood pressure, SPO2 monitor. Patient is A&Ox 4. Patient reports no complaints.     Patient in stretcher, in low position, with side rails up, call bell within reach, patient instructed to call of assistance as needed.    Patient prep in: CCL Recovery Area, Bed# FT.   Family in: outside the hosptial.   Prep by: M.Bardia Wangerin,RN

## 2021-08-30 NOTE — Anesthesia Pre-Procedure Evaluation (Signed)
Department of Anesthesiology  Preprocedure Note       Name:  Deborah [name]   Age:  66 y.o.  DOB:  21-Jun-1955                                          MRN:  [id number]         Date:  08/30/2021      Surgeon: Juliann Mule):  Shelle Iron, MD    Procedure: Procedure(s):  Ablation PVC    Medications prior to admission:   Prior to Admission medications    Medication Sig Start Date End Date Taking? Authorizing Provider   mexiletine (MEXITIL) 150 MG capsule Take 1 capsule by mouth 3 times daily 08/27/21   Gabriel Rung, APRN - NP   amiodarone (CORDARONE) 200 MG tablet Take 1 tablet by mouth daily 08/27/21   Gabriel Rung, APRN - NP   diclofenac (VOLTAREN) 50 MG EC tablet Take 1 tablet by mouth daily 06/19/21   Historical Provider, MD   gabapentin (NEURONTIN) 100 MG capsule Take 1 capsule by mouth 3 times daily for 60 days. Max Daily Amount: 300 mg 06/28/21 08/30/21  Brynda Greathouse, MD   Probiotic Product (ACIDOPHILUS PROBIOTIC) CAPS capsule Take 1 capsule by mouth daily 06/29/21   Mariana Kaufman Do, MD   bumetanide (BUMEX) 1 MG tablet Take 1 tablet by mouth 2 times daily  Patient taking differently: Take 1 tablet by mouth three times a week Taking twice a day 06/28/21   Brynda Greathouse, MD   potassium chloride (KLOR-CON M) 20 MEQ extended release tablet Take 1 tablet by mouth daily 06/28/21   Mariana Kaufman Do, MD   midodrine (PROAMATINE) 10 MG tablet Take 1 tablet by mouth 3 times daily 06/28/21   Khoi B Do, MD   glipiZIDE (GLUCOTROL) 5 MG tablet Take 1 tablet by mouth 2 times daily (before meals)    Historical Provider, MD   albuterol sulfate HFA (PROVENTIL;VENTOLIN;PROAIR) 108 (90 Base) MCG/ACT inhaler Inhale 2 puffs into the lungs every 6 hours as needed    Ar Automatic Reconciliation   aspirin 81 MG chewable tablet Take 1 tablet by mouth daily 12/03/20   Ar Automatic Reconciliation   atorvastatin (LIPITOR) 10 MG tablet Take 1 tablet by mouth daily    Ar Automatic Reconciliation   citalopram (CELEXA) 40 MG tablet Take 1 tablet by mouth daily 02/22/20   Ar Automatic  Reconciliation   fluticasone (FLONASE) 50 MCG/ACT nasal spray 2 sprays by Nasal route daily as needed    Ar Automatic Reconciliation   levothyroxine (SYNTHROID) 150 MCG tablet Take 1 tablet by mouth every morning (before breakfast) 01/27/21   Ar Automatic Reconciliation   miconazole (MICOTIN) 2 % powder Apply topically as needed 04/24/21   Ar Automatic Reconciliation   montelukast (SINGULAIR) 10 MG tablet Take 1 tablet by mouth nightly    Ar Automatic Reconciliation   polyethylene glycol (GLYCOLAX) 17 GM/SCOOP powder Take 17 g by mouth daily as needed 04/24/21   Ar Automatic Reconciliation   sucralfate (CARAFATE) 1 GM tablet Take 1 tablet by mouth 3 times daily    Ar Automatic Reconciliation   traZODone (DESYREL) 100 MG tablet Take 1 tablet by mouth nightly 12/31/19   Ar Automatic Reconciliation       Current medications:    Current Facility-Administered Medications   Medication Dose Route Frequency Provider Last Rate Last  Admin   . sodium chloride flush 0.9 % injection 5-40 mL  5-40 mL IntraVENous PRN Bing Matter, APRN - NP       . acetaminophen (TYLENOL) tablet 650 mg  650 mg Oral Q4H PRN Bing Matter, APRN - NP       . ondansetron [facility]) injection 4 mg  4 mg IntraVENous Q6H PRN Bing Matter, APRN - NP       . HYDROcodone-acetaminophen (NORCO) 5-325 MG per tablet 1 tablet  1 tablet Oral Q6H PRN Bing Matter, APRN - NP       . sodium chloride flush 0.9 % injection 5-40 mL  5-40 mL IntraVENous 2 times per day Shelle Iron, MD       . sodium chloride flush 0.9 % injection 5-40 mL  5-40 mL IntraVENous PRN Shelle Iron, MD       . 0.9 % sodium chloride infusion   IntraVENous PRN Shelle Iron, MD           Allergies:    Allergies   Allergen Reactions   . Nitroglycerin      Other reaction(s): Unknown (comments)  hypotension   . Hydrochlorothiazide Other (See Comments)     Reports 'kidneys dry up"    . Tetanus Toxoids Swelling   . Aloe Vera Rash       Problem List:    Patient Active Problem List    Diagnosis Code   . Arm paresthesia, left R20.2   . Thrombocytopenia ([place]) D69.6   . Morbid obesity (HCC) E66.01   . CKD (chronic kidney disease), stage III ([place]) N18.30   . 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 Pacemaker Z95.0   . Asthma J45.909   . Anxiety and depression F41.9, F32.A   . Chronic narcotic use F11.90   . Chronic pain G89.29   . Rhinitis J31.0   . Hyperlipidemia E78.5   . Neuropathy G62.9   . GERD (gastroesophageal reflux disease) K21.9   . CHF (congestive heart failure) (HCC) I50.9   . Elevated bilirubin R17   . Liver cirrhosis ([place]) K74.60   . Colitis K52.9   . Acute on chronic diastolic (congestive) heart failure (HCC) I50.33   . PVC (premature ventricular contraction) I49.3   . Systemic inflammatory response syndrome (SIRS) (HCC) R65.10   . Acute on chronic heart failure with preserved ejection fraction (HFpEF) (HCC) I50.33   . Hypothyroidism E03.9   . Chronic respiratory failure with hypoxia (HCC) J96.11   . BMI 50.0-59.9, adult ([place]) Z68.43   . Hypokalemia due to loss of potassium E87.6   . 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 Acute on chronic respiratory failure with hypoxia and hypercapnia (HCC) J96.21, J96.22   . OSA (obstructive sleep apnea) G47.33   . COPD (chronic obstructive pulmonary disease) ([place]) J44.9   . History of PSVT (paroxysmal supraventricular tachycardia) Z86.79   . Anemia D64.9   . Hypomagnesemia E83.42   . Anasarca R60.1       Past Medical History:        Diagnosis Date   . (HFpEF) heart failure with preserved ejection fraction ([place])    . Anxiety and depression    . Aortic valve replaced     S/p bovine aortic valve replacement.   . Asthma    . Chronic narcotic use    . Chronic obstructive pulmonary disease ([place])    . Chronic pain    . CKD (chronic kidney  disease), stage III ([place])     Baseline creatinine is 1.3-1.4 with GFR in the 40s.   . DM type 2 causing renal disease ([place])    . GERD (gastroesophageal reflux disease)    . History of vascular access  device 04/13/2021    4 FR Single PICC for LTABX: R cephalic vessell length 48 CM Max P leave @ 1 CM out; Arm circumferenc 40 CM   . Hyperlipidemia    . Hypothyroidism    . Morbid obesity ([place])    . Neuropathy    . Obstructive sleep apnea    . Rhinitis        Past Surgical History:        Procedure Laterality Date   . AORTIC VALVE REPLACEMENT      Bovine bioprosthetic   . COLONOSCOPY N/A 12/01/2020    COLONOSCOPY performed by Rachael Darby, MD at [place]   . PACEMAKER         Social History:    Social History     Tobacco Use   . Smoking status: Former     Packs/day: 1.00     Types: Cigarettes     Quit date: 03/21/2010     Years since quitting: 11.4   . Smokeless tobacco: Never   Substance Use Topics   . Alcohol use: Not Currently                                Counseling given: Not Answered      Vital Signs (Current):   Vitals:    08/30/21 1058   BP: (!) 128/47   Pulse: 60   Resp: 22   Temp: 98.3 F (36.8 C)   TempSrc: Oral   SpO2: 93%   Weight: 136.1 kg (300 lb)   Height: 1.715 m (5' 7.5")                                              BP Readings from Last 3 Encounters:   08/30/21 (!) 128/47   08/19/21 (!) 103/44   08/14/21 100/62       NPO Status: Time of last liquid consumption: 0700                        Time of last solid consumption: 2100                        Date of last liquid consumption: 08/30/21                        Date of last solid food consumption: 08/29/21    BMI:   Wt Readings from Last 3 Encounters:   08/30/21 136.1 kg (300 lb)   08/19/21 136.1 kg (300 lb)   08/14/21 (!) 136.5 kg (301 lb)     Body mass index is 46.29 kg/m.    CBC:   Lab Results   Component Value Date/Time    WBC 11.3 08/19/2021 10:18 AM    RBC 4.68 08/19/2021 10:18 AM    HGB 12.9 08/19/2021 10:18 AM    HCT 40.8 08/19/2021 10:18 AM    MCV 87.2 08/19/2021 10:18 AM    RDW 15.7 08/19/2021 10:18 AM  PLT 85 08/19/2021 10:18 AM       CMP:   Lab Results   Component Value Date/Time    NA 140 08/19/2021 10:18 AM    K 3.8 08/19/2021  10:18 AM    CL 103 08/19/2021 10:18 AM    CO2 32 08/19/2021 10:18 AM    BUN 24 08/19/2021 10:18 AM    CREATININE 1.55 08/19/2021 10:18 AM    GFRAA 56 08/26/2020 04:25 PM    AGRATIO 0.8 05/26/2021 03:11 AM    LABGLOM 37 08/19/2021 10:18 AM    GLUCOSE 138 08/19/2021 10:18 AM    PROT 7.3 08/19/2021 10:18 AM    CALCIUM 9.4 08/19/2021 10:18 AM    BILITOT 3.7 08/19/2021 10:18 AM    ALKPHOS 86 08/19/2021 10:18 AM    ALKPHOS 37 05/26/2021 03:11 AM    AST 14 08/19/2021 10:18 AM    ALT 15 08/19/2021 10:18 AM       POC Tests:   Recent Labs     08/30/21  1102   POCGLU 111       Coags:   Lab Results   Component Value Date/Time    PROTIME 11.4 06/27/2021 03:55 AM    INR 1.1 06/27/2021 03:55 AM       HCG (If Applicable): No results found for: PREGTESTUR, PREGSERUM, HCG, HCGQUANT     ABGs:   Lab Results   Component Value Date/Time    PHART 7.37 06/24/2021 10:45 AM    PO2ART 66 06/24/2021 10:45 AM    PCO2ART 64 06/24/2021 10:45 AM    HCO3ART 36 06/24/2021 10:45 AM    BEART 8.5 06/24/2021 10:45 AM        Type & Screen (If Applicable):  No results found for: LABABO, LABRH    Drug/Infectious Status (If Applicable):  No results found for: HIV, HEPCAB    COVID-19 Screening (If Applicable):   Lab Results   Component Value Date/Time    COVID19 Not detected 04/08/2021 09:27 PM    COVID19 Not detected 08/26/2020 06:32 PM           Anesthesia Evaluation  Patient summary reviewed and Nursing notes reviewed  Airway: Mallampati: III  TM distance: >3 FB   Neck ROM: full  Mouth opening: > = 3 FB   Dental:    (+) upper dentures and poor dentition      Pulmonary:   (+) COPD:  sleep apnea: on CPAP,  decreased breath sounds: bilateral asthma:                           ROS comment: H/O tracheostomy after AVR   Cardiovascular:  Exercise tolerance: poor (<4 METS),   (+) valvular problems/murmurs:, pacemaker:, dysrhythmias: SVT and PVC, CHF:, hyperlipidemia        Rhythm: regular  Rate: normal                 ROS comment: S/P AVR     Neuro/Psych:   (+)  neuromuscular disease:, psychiatric history:             ROS comment: Neuropathy  GI/Hepatic/Renal:   (+) GERD: well controlled, liver disease:, renal disease: CRI, morbid obesity          Endo/Other:    (+) Diabetes, hypothyroidism, blood dyscrasia: thrombocytopenia:., .                 Abdominal:   (+) obese,  Vascular: negative vascular ROS.         Other Findings:           Anesthesia Plan      MAC     ASA 4       Induction: intravenous.      Anesthetic plan and risks discussed with patient.                        Henry Schein, DO   08/30/2021

## 2021-08-30 NOTE — Procedures (Signed)
Cardiac Procedure Note   Patient: Deborah [name]  MRN: [id number]  SSN: [id number]   Date of Birth: 01-26-1956 Age: 66 y.o.  Sex: female    Date of Procedure: 08/30/2021   Pre-procedure Diagnosis: frequent PVC, hx of VT, AVR (bio), dual chamber Environmental education officer, morbid obesity  Post-procedure Diagnosis: same  Procedure: vascular ultrasound, EP Study and Ablation of LV mid-apical anterior wall.  Intracardiac echo used for transeptal puncture  Isuprel to induce PVC for 3 D mapping and pacemapping  Operator:  Dr. Thurston Pounds, MD    Assistant(s):  None  Anesthesia: Moderate Sedation by CRNA  Estimated Blood Loss: Less than 40 mL (right femoral artery bleeding with sheath removal), hx of low platelet, morbid obesity, moderate hematoma right groin  Specimens Removed: None  Findings:   Right femoral artery and vein access and left femoral vein access with vascular ultrasound guidance  Arteriogram was done before sheath was removed and artery was closed with perclose, back bleeding caused hematoma.  Heparin had been reversed with protamine  PVC mapped to LV anterior mid-apical wall, ablations were done at 40 W with saline irrigated tip St Jude catheter  ICE was used to gain transeptal access and LA to LV approach since bioprosthetic aortic valve is calcified and could not have ablation catheter crossed into LV  Dual chamber pacer was programmed to DDDR 70-130 bpm  No more PVC post ablation   [employer] pacemaker has 1.5 year battery left  Complications: None   Implants:  None  Signed by:  Thurston Pounds, MD  08/30/2021  4:17 PM

## 2021-08-31 LAB — ELECTROPHYSIOLOGY PROCEDURE: Body Surface Area: 2.55 m2

## 2021-09-11 ENCOUNTER — Encounter: Payer: MEDICARE | Attending: Nurse Practitioner | Primary: Family

## 2021-09-11 ENCOUNTER — Encounter: Payer: MEDICARE | Primary: Family

## 2021-09-27 ENCOUNTER — Encounter

## 2021-09-27 ENCOUNTER — Ambulatory Visit: Admit: 2021-09-27 | Discharge: 2021-09-27 | Payer: MEDICARE | Primary: Family

## 2021-09-27 ENCOUNTER — Ambulatory Visit: Admit: 2021-09-27 | Discharge: 2021-10-03 | Payer: MEDICARE | Attending: Nurse Practitioner | Primary: Family

## 2021-09-27 DIAGNOSIS — Z950 Presence of cardiac pacemaker: Secondary | ICD-10-CM

## 2021-09-27 DIAGNOSIS — I493 Ventricular premature depolarization: Secondary | ICD-10-CM

## 2021-09-27 NOTE — Patient Instructions (Signed)
You are scheduled for the following procedure with Dr. Loma Newton:  TEE at [facility], [address] [place], [place], [place] [address]        PLEASE be aware that your procedure date/time is tentative and subject to change due to emergency cases.      Procedure date/time:    November 05, 2021  Time: 12:00 pm - please arrive by  10:00 am      ARRIVAL time:  (You will need driver to be discharged home with.)     [X]   [facility] procedures: please arrive to check in on 2nd floor two hours prior to your procedure the day of your procedure.      Pre-procedure Labs/Imaging:    PRE-PROCEDURE LABS NEEDED: YES - Please have these done after 10/06/21 and before 11/02/21.  Forms for labwork may be given at appointment. If not, they will be mailed to you. Labs should be completed within 5-7 days of procedure. These can be done at any [facility] or 01/02/22.        [facility]  [address], [address]  [place], [place]  Monday-Friday 730A-430P (closed [phone]    [facility]  [address]   [place], [place]  Monday-Friday 8:00AM - 5:00 PM   [phone]    [facility]  [address], [address]  [place], [place]  Monday-Friday 7A-5P  [phone]    [facility]  [address]  [place], [place]  Monday-Friday [phone]  [phone] (closed 1-2P)    [facility]  [address] , [address]  [place], [place]  Monday-Friday 730A-430P [phone]    [facility]  [address] [place], [place]  Monday-Friday 7A-430P  [phone]    [facility]  [address]  [place], [place]  Monday-Friday 730A-430P (closed [phone]          Medication Instructions:    Please do not stop or hold any  medications prior to your procedure.  You may want to hold your diabetic medications.        Nothing to eat or drink after midnight before your procedure.     You may take all other medications as directed the morning of your procedure with a sip of water unless otherwise indicated.        Please contact the office [phone] and ask for a member of Dr. [phone] team for procedure questions. There is a physician on call for the office after hours for immediate needs.     FOLLOW UP:   Your appointments will be made for you post procedure based off of discharge instructions. You may have driving restrictions for a short time after your procedure (usually 2-3 days). Pacemaker/ICD patients will be unable to have an MRI until 6 weeks after implant, NO dental work for 8 weeks after your implant.

## 2021-09-27 NOTE — Addendum Note (Signed)
Addended by: Jacklynn Barnacle on: 09/27/2021 02:49 PM     Modules accepted: Orders

## 2021-09-27 NOTE — Progress Notes (Unsigned)
[facility]  Cardiac Electrophysiology Clinic Care Note                  [] Initial visit     [x] Established visit     Patient Name: Deborah [name] - DOB:09/17/1955 - WRU:[id number]  Primary Cardiologist: Asencion Noble, MD  Electrophysiologist: Thurston Pounds, MD     Reason for visit: Follow up post PVC ablation    HPI:  [name] is a 66 y.o. female who presents for follow up, is s/p ablation of LV mid-apical anterior PVCs on 08/30/2021.    Post procedure, she continues amiodarone & mexiletine.  She reports ***.    ECG today shows ***.    Echo in 04/2021 showed LVEF 60-65%, had bioprosthetic valve with regurgitant jet & possibly valve stenosis.    Nuclear stress test in 01/2021 showed no ischemia.    BP trends low, *** today on midodrine.      Previous:  S/p ablation of LV mid-apical anterior PVCs on 08/30/2021.  Right groin hematoma noted at end of case.    Holter in 08/2020 showed >30K PVCs.    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    Chronic thrombocytopenia.    OSA on CPAP.     Assessment and Plan     PVCs: S/p ablation of LV mid-apical anterior PVCs on 08/30/2021.  ECG today shows ***.  Will check 24 hour holter for current PVC burden, then stop amiodarone if burden is low.  Will continue mexiletine for a while beyond that.    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  Device check today shows ***.    S/p AVR: Bioprosthetic valve is calcified.  Continue to monitor via serial echocardiograms.    Hypotension: ***.  Continue midodrine.       Remote pacer checks q 3 months.  Follow up with Dr. Loma Newton in 6 months.  Follow up with Dr. Barry Dienes as previously scheduled.    Future Appointments   Date Time Provider Department Center   09/27/2021  1:00 PM PACEMAKER, [facility]   09/27/2021  1:20 PM French Ana, APRN - NP [facility]   10/10/2021 11:40 AM Candyce Churn, DO [facility]   03/13/2022   2:00 PM PACEMAKER, [facility]   03/13/2022  2:20 PM Thurston Pounds, MD [facility]         ____________________________________________________________    Cardiac testing    05/11/21    TRANSTHORACIC ECHOCARDIOGRAM (TTE) COMPLETE (CONTRAST/BUBBLE/3D PRN) 05/22/2021 10:53 PM (Final)    Narrative  This is a summary report. The complete report is available in the patient's medical record. If you cannot access the medical record, please contact the sending organization for a detailed fax or copy.      Contrast used: Definity.    Technical qualifiers: Echo study was technically difficult with poor endocardial visualization, limited due to patient's condition, limited due to patient tolerance and technically difficult due to patient's heart rhythm.    Left Ventricle: Normal left ventricular systolic function with a visually estimated EF of 60 - 65%. Left ventricle size is normal. Mildly increased wall thickness. Unable to assess wall motion.    Aortic Valve: Not well visualized. Bioprosthetic valve. AV mean gradient is 34 mmHg. LVOT:AV VTI Index is 0.38.  With a mean PG 34 mmHg, DVI 0.38 and an intermediate jet counter, there is possibly some valve  stenosis but likely not significant.    Signed by: Laddie Aquas, MD on 05/22/2021 10:53 PM        02/16/21    NM STRESS TEST WITH MYOCARDIAL PERFUSION 02/20/2021  4:57 PM (Final)    Narrative  This is a summary report. The complete report is available in the patient's medical record. If you cannot access the medical record, please contact the sending organization for a detailed fax or copy.      Nuclear Findings: LV perfusion is normal. There is no evidence of inducible ischemia.    Nuclear Findings: The defect appears to be caused by breast attenuation.    ECG: Resting ECG demonstrates normal sinus rhythm.    ECG: Stress ECG was negative for ischemia.    Stress Test: A pharmacological stress test was performed using lexiscan. Hemodynamics are adequate for diagnosis.  Blood pressure demonstrated a normal response and heart rate demonstrated a normal response to stress. The patient's heart rate recovery was normal. The patient reported dyspnea and no chest pain during the stress test.    Ventricular bigeminy  Unifocal PVC, RBBB and superior axis PVCs    Signed by: Thurston Pounds, MD on 02/20/2021  4:57 PM         No results found for this or any previous visit.           ECG: ***      Review of Systems    [x] All other systems reviewed and all negative except as written in HPI    []  Patient unable to provide secondary to condition              Patient Active Problem List   Diagnosis    Arm paresthesia, left    Thrombocytopenia (HCC)    Morbid obesity (HCC)    CKD (chronic kidney disease), stage III (HCC)    Aortic valve replaced    Pacemaker    Asthma    Anxiety and depression    Chronic narcotic use    Chronic pain    Rhinitis    Hyperlipidemia    Neuropathy    GERD (gastroesophageal reflux disease)    CHF (congestive heart failure) (HCC)    Elevated bilirubin    Liver cirrhosis (HCC)    Colitis    Acute on chronic diastolic (congestive) heart failure (HCC)    PVC (premature ventricular contraction)    Systemic inflammatory response syndrome (SIRS) (HCC)    Acute on chronic heart failure with preserved ejection fraction (HFpEF) (HCC)    Hypothyroidism    Chronic respiratory failure with hypoxia (HCC)    BMI 50.0-59.9, adult (HCC)    Hypokalemia due to loss of potassium    DM (diabetes mellitus), type 2 with complications (HCC)    Acute on chronic respiratory failure with hypoxia and hypercapnia (HCC)    OSA (obstructive sleep apnea)    COPD (chronic obstructive pulmonary disease) (HCC)    History of PSVT (paroxysmal supraventricular tachycardia)    Anemia    Hypomagnesemia    Anasarca    S/P ablation of ventricular arrhythmia       Past Medical History:   Diagnosis Date    (HFpEF) heart failure with preserved ejection fraction (HCC)     Anxiety and depression     Aortic valve replaced      S/p bovine aortic valve replacement.    Asthma     Chronic narcotic use     Chronic obstructive pulmonary disease (HCC)  Chronic pain     CKD (chronic kidney disease), stage III (HCC)     Baseline creatinine is 1.3-1.4 with GFR in the 40s.    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)     GERD (gastroesophageal reflux disease)     History of vascular access device 04/13/2021    4 FR Single PICC for LTABX: R cephalic vessell length 48 CM Max P leave @ 1 CM out; Arm circumferenc 40 CM    Hyperlipidemia     Hypothyroidism     Morbid obesity (HCC)     Neuropathy     Obstructive sleep apnea     Rhinitis        Past Surgical History:   Procedure Laterality Date    AORTIC VALVE REPLACEMENT      Bovine bioprosthetic    CARDIAC PROCEDURE N/A 08/30/2021    Intracardiac echocardiogram performed by Thurston Pounds, MD at [facility] CARDIAC CATH LAB    COLONOSCOPY N/A 12/01/2020    COLONOSCOPY performed by Ian Malkin, MD at [facility] ENDOSCOPY    EP DEVICE PROCEDURE N/A 08/30/2021    Ablation PVC performed by Thurston Pounds, MD at [facility] CARDIAC CATH LAB    EP DEVICE PROCEDURE N/A 08/30/2021    Drug stimulation performed by Thurston Pounds, MD at [facility] CARDIAC CATH LAB    EP DEVICE PROCEDURE N/A 08/30/2021    Transeptal puncture performed by Thurston Pounds, MD at [facility] CARDIAC CATH LAB    EP DEVICE PROCEDURE N/A 08/30/2021    Ep 3d mapping performed by Thurston Pounds, MD at [facility] CARDIAC CATH LAB    INVASIVE VASCULAR N/A 08/30/2021    Ultrasound guided vascular access performed by Thurston Pounds, MD at [facility] CARDIAC CATH LAB    PACEMAKER         Allergies   Allergen Reactions    Nitroglycerin      Other reaction(s): Unknown (comments)  hypotension    Hydrochlorothiazide Other (See Comments)     Reports 'kidneys dry up"     Tetanus Toxoids Swelling    Aloe Vera Rash       Social History     Tobacco Use    Smoking status: Former     Packs/day: 1.00     Types: Cigarettes     Quit date: 03/21/2010     Years since quitting: 11.5    Smokeless tobacco: Never   Vaping Use    Vaping Use:  Never used   Substance Use Topics    Alcohol use: Not Currently    Drug use: Never       Family History   Problem Relation Age of Onset    Hypertension Mother     Hypertension Father            OBJECTIVE:    Physical Exam    Vitals: There were no vitals filed for this visit.      General:    Alert, cooperative, no distress, appears stated age.   Neck:   Supple, no carotid bruit and no JVD.   Back:     Symmetric.   Lungs:     Clear to auscultation bilaterally.   Heart::    Regular rate and rhythm.  2/6 systolic murmur.  No click, rub or gallop.   Abdomen:     Soft, non-tender. Bowel sounds normal. Obese.   MSK:   Extremities normal, atraumatic.  Moves extremities independently.   Vasc/lymph:   No lower extremity edema.   Skin:  Skin color normal. No rashes or lesions on visible areas.   Neurologic:   Alert, moves all extremities.        Data Review:     Radiology:   XR Results (most recent):    CT Result (most recent):  CT ABDOMEN PELVIS W IV CONTRAST 08/19/2021    Narrative  EXAM: CT ABDOMEN PELVIS W IV CONTRAST    INDICATION: RLQ pain and rebound tenderness, eval for appendicitis    COMPARISON: CT 04/17/2021    CONTRAST: 100 mL of Isovue-370.    ORAL CONTRAST: None. The lack of oral contrast material diminishes the capacity  of CT to evaluate the bowel and adjacent structures.    TECHNIQUE:  Following the uneventful intravenous administration of contrast, thin axial  images were obtained through the abdomen and pelvis. Coronal and sagittal  reconstructions were generated. CT dose reduction was achieved through use of a  standardized protocol tailored for this examination and automatic exposure  control for dose modulation.    FINDINGS:  LOWER THORAX: Clear lungs. Mild cardiac contour enlargement. Artifacts related  to pacemaker leads and AVR.  LIVER: No mass. Mildly lobular contour again shown.  BILIARY TREE: Status post cholecystectomy. CBD is not dilated. No intrahepatic  bile duct dilation.  SPLEEN: Splenomegaly  with splenic length of 16.2 cm, similar to previous.  PANCREAS: 13 mm uncinate process hypoattenuating lesion unchanged.  ADRENALS: 13 mm nonspecific nodule of left adrenal gland unchanged.  KIDNEYS: Right nephrolithiasis again shown including nonobstructing calculus of  renal pelvis measuring 8 mm, similar in position to previous.  STOMACH: Unremarkable.  SMALL BOWEL: No dilatation or wall thickening.  COLON: Moderate mural thickening and pericolonic fat stranding of the ascending  colon. Other segments of the colon show normal caliber and mural thickness.  APPENDIX: Normal.  PERITONEUM: No ascites or pneumoperitoneum.  RETROPERITONEUM: No lymphadenopathy or aortic aneurysm. Inferior vena cava  filter appears unchanged.  REPRODUCTIVE ORGANS: Unremarkable.  URINARY BLADDER: Nondistended and therefore suboptimally assessed. No obvious  abnormality.  BONES: No destructive bone lesion.  ABDOMINAL WALL: Small periumbilical hernia containing fat unchanged.  ADDITIONAL COMMENTS: N/A    Impression  1. Moderate mural thickening and pericolonic fat stranding of the ascending  colon consistent with nonspecific colitis. No evidence for appendicitis. Other  colonic segments appear normal. No free peritoneal air or fluid.  2. Other findings are unchanged including mild lobular contour of liver,  splenomegaly, hypoattenuating 13 mm lesion of the uncinate process of the  pancreas, 13 mm left adrenal nodule, right nephrolithiasis, and periumbilical  hernia.    MRI Result (most recent):  No results found for this or any previous visit from the past 3650 days.          Current meds:  Current Outpatient Medications   Medication Sig Dispense Refill    mexiletine (MEXITIL) 150 MG capsule Take 1 capsule by mouth 3 times daily 120 capsule 2    amiodarone (CORDARONE) 200 MG tablet Take 1 tablet by mouth daily 30 tablet 3    diclofenac (VOLTAREN) 50 MG EC tablet Take 1 tablet by mouth daily      gabapentin (NEURONTIN) 100 MG capsule Take 1  capsule by mouth 3 times daily for 60 days. Max Daily Amount: 300 mg 90 capsule 1    Probiotic Product (ACIDOPHILUS PROBIOTIC) CAPS capsule Take 1 capsule by mouth daily 30 capsule 1    bumetanide (BUMEX) 1 MG tablet Take 1 tablet by mouth 2 times daily (Patient taking  differently: Take 1 tablet by mouth three times a week Taking twice a day) 60 tablet 1    potassium chloride (KLOR-CON M) 20 MEQ extended release tablet Take 1 tablet by mouth daily 30 tablet 1    midodrine (PROAMATINE) 10 MG tablet Take 1 tablet by mouth 3 times daily 90 tablet 1    glipiZIDE (GLUCOTROL) 5 MG tablet Take 1 tablet by mouth 2 times daily (before meals)      albuterol sulfate HFA (PROVENTIL;VENTOLIN;PROAIR) 108 (90 Base) MCG/ACT inhaler Inhale 2 puffs into the lungs every 6 hours as needed      aspirin 81 MG chewable tablet Take 1 tablet by mouth daily      atorvastatin (LIPITOR) 10 MG tablet Take 1 tablet by mouth daily      citalopram (CELEXA) 40 MG tablet Take 1 tablet by mouth daily      fluticasone (FLONASE) 50 MCG/ACT nasal spray 2 sprays by Nasal route daily as needed      levothyroxine (SYNTHROID) 150 MCG tablet Take 1 tablet by mouth every morning (before breakfast)      miconazole (MICOTIN) 2 % powder Apply topically as needed      montelukast (SINGULAIR) 10 MG tablet Take 1 tablet by mouth nightly      polyethylene glycol (GLYCOLAX) 17 GM/SCOOP powder Take 17 g by mouth daily as needed      sucralfate (CARAFATE) 1 GM tablet Take 1 tablet by mouth 3 times daily      traZODone (DESYREL) 100 MG tablet Take 1 tablet by mouth nightly       No current facility-administered medications for this visit.          French Ana, APRN - NP  [facility]  [address], [address]  [place], [place] [address]  [phone]      KG:MWNU Claudean Kinds, APRN - NP

## 2021-09-27 NOTE — Progress Notes (Signed)
Room #: 3    C/o dizziness.  Recent admission at [facility] for anemia/GIB.      Chief Complaint   Patient presents with    Device Check    Follow-up     Post PVC ablation    Other     PVC       Vitals:    09/27/21 1312   BP: (!) 116/54   Site: Right Upper Arm   Position: Sitting   Cuff Size: Large Adult   Pulse: 71   Resp: 18   SpO2: 96%   Weight: 294 lb 9.6 oz (133.6 kg)   Height: 5' 7.5" (1.715 m)         Chest pain:  NO  Shortness of breath:  YES  Edema: YES  Palpitations, skipped beats, rapid heartbeat:  YES  Dizziness:  YES    1. Have you been to the ER, urgent care clinic since your last visit?  Hospitalized since your last visit?  09/13/21 - GIB - [facility]    2. Have you seen or consulted any other health care providers outside of the [facility] since your last visit?  Include any pap smears or colon screening. NO      Refills:  NO

## 2021-09-28 ENCOUNTER — Encounter

## 2021-09-28 ENCOUNTER — Ambulatory Visit: Admit: 2021-09-28 | Payer: MEDICARE | Primary: Family

## 2021-09-28 DIAGNOSIS — I493 Ventricular premature depolarization: Secondary | ICD-10-CM

## 2021-09-28 LAB — CBC
Hematocrit: 31.7 % — ABNORMAL LOW (ref 35.0–47.0)
Hemoglobin: 9.6 g/dL — ABNORMAL LOW (ref 11.5–16.0)
MCH: 27.1 PG (ref 26.0–34.0)
MCHC: 30.3 g/dL (ref 30.0–36.5)
MCV: 89.5 FL (ref 80.0–99.0)
MPV: 11.9 FL (ref 8.9–12.9)
Nucleated RBCs: 0 PER 100 WBC
Platelets: 121 10*3/uL — ABNORMAL LOW (ref 150–400)
RBC: 3.54 M/uL — ABNORMAL LOW (ref 3.80–5.20)
RDW: 14.7 % — ABNORMAL HIGH (ref 11.5–14.5)
WBC: 7 10*3/uL (ref 3.6–11.0)
nRBC: 0 10*3/uL (ref 0.00–0.01)

## 2021-09-28 NOTE — Telephone Encounter (Signed)
Enrolled with [employer] - Ordered and being shipped to patient's home address on file.  ETA within 7-14 Business Days.        3 Day Holter per Costella Hatcher, NP  dx:  PVC

## 2021-10-01 ENCOUNTER — Emergency Department: Admit: 2021-10-01 | Payer: MEDICARE | Primary: Family

## 2021-10-01 ENCOUNTER — Inpatient Hospital Stay
Admit: 2021-10-01 | Discharge: 2021-10-05 | Disposition: A | Discharge status: Skilled Nursing Facility | Payer: MEDICARE | Admitting: Internal Medicine

## 2021-10-01 DIAGNOSIS — W19XXXA Unspecified fall, initial encounter: Secondary | ICD-10-CM

## 2021-10-01 DIAGNOSIS — I9789 Other postprocedural complications and disorders of the circulatory system, not elsewhere classified: Secondary | ICD-10-CM

## 2021-10-01 LAB — EXTRA TUBES HOLD

## 2021-10-01 NOTE — ED Triage Notes (Addendum)
Patient arrives with c/o GLF, lower back pain, and light-headedness/dizziness.     Reports suffering GLF today. States was ambulating out of bathroom at home, when she became light-headed/dizzy, falling forward hitting face on end of couch. States hit left hand between couch and her cane, reports tingling in hand post fall.    Reports frequent falls, stating has fallen 4 times in the past two weeks.    Reports Aug. 3 she had cardiac ablation at [facility] by Dr. Loma Newton due to frequent PCV's. Reports was admitted to hospital in [place] due to fall. Reports was discharged with home O2, 2 L NC.    Denies dizziness in triage, vision change, vision change, chest pain, SOB.     Dr. Lequita Halt in triage assessing patient.

## 2021-10-01 NOTE — H&P (Addendum)
Hospitalist Admission Note    NAME:  Deborah [name]   DOB:  11-Jun-1955   MRN:  [id number]     Date/Time:  10/01/2021 11:13 PM    Patient PCP: Karilyn Cota, APRN - NP    Please note that this dictation was completed with Dragon, the computer voice recognition software.  Quite often unanticipated grammatical, syntax, homophones, and other interpretive errors are inadvertently transcribed by the computer software.  Please disregard these errors.  Please excuse any errors that have escaped final proofreading  ________________________________________________________________________    Given the patient's current clinical presentation, I have a high level of concern for decompensation if discharged from the emergency department.  Complex decision making was performed, which includes reviewing the patient's available past medical records, laboratory results, and x-ray films.       My assessment of this patient's clinical condition and my plan of care is as follows.    Assessment / Plan:    Dizziness causing recurrent falls since cardiac PVC ablation 08/30/21  Pacemaker  Hx hypotension on midodrine  --check orthostatics, monitor on tele  --interrogate pacemaker  --consider cardiology consult.  Seen 09/27/21 in office  --continue amiodarone with hold parameter for HR <55, and mexilitine  --cont midodrine    Low back pain acute on chronic after fall  Severe lumbar spinal stenosis on CT  Neck pain with b/l subjective arm weakness and finger paresthesia  Right foot pain  --L3 tender to palpation; no acute injury on CT lumbar spine  --restart gabapentin 130m tid.  Increase home prn tramadol to q6h  --get CT cspine.  Unable to have MRI due to pacemaker  --xray right foot    Chronic diastolic chf  S/p bioprosthetic AVR  --continue bumex 165mbid on MWF and 20m23mu, Th, Sa, Sun.  Reduce diuretic if patient is orthostatic  --seems euvolemic by exam  --cont aspirin    UGIB with ABLA 08/2021  --cont PPI and carafate.      DM 2  --continue  glipizide 5mg62md, monitor for hypoglycemia.    CKD 3  Cr stable 1.3-1.5    OSA  Chronic hypoxic respiratory failure  --continue home bipap.  She will need to have family bring in home machine  --supposed to be on 2L O2 at home but not using consistently.  Would check to see if dizziness associated with hypoxia.    Severe obesity Body mass index is 46.2 kg/m.      Medical Decision Making:   I have personally reviewed the radiographs, laboratory data in Epic and decisions and statements above are based partially on this personal interpretation.    Drug monitoring:       Code Status: Full Code  DVT Prophylaxis: Lovenox  GI Prophylaxis: PPI         Subjective:   CHIEF COMPLAINT: "dizziness"    HISTORY OF PRESENT ILLNESS:     Deborah [name] 65 y54.  female with PMHx HFpEF, bioprosthetic AVR, hypotension on midodrine, lumbar spinal stenosis, s/p pacemaker, DM 2, OSA on cpap and 2L O2 at home presents to ER with dizziness and recurrent falls.    Underwent PVC ablation 08/30/21 by Dr. Ngo Raelene Bott been having dizziness and weakness since.  Dizziness intermittent, worse when getting up and down.  No LOC, HA, n/v, unsteady gait, vision change.  Has fallen 4x in past 2 weeks due to dizziness.    Following ablation, she was admitted to [facility] with  ABLA requiring transfusion while visiting son.  Was put on carafate and PPI and told to stop diclofenac and gabapentin.    Falling onto either her knees but earlier this evening fell face down onto sofa and having low back pain.  Also generalized weakness, neck pain and b/l arm heaviness and tingling in fingers since ablation.      Available records were reviewed at the time of H&P.        Past Medical History:   Diagnosis Date    (HFpEF) heart failure with preserved ejection fraction (HCC)     Anxiety and depression     Aortic valve replaced     S/p bovine aortic valve replacement.    Asthma     Chronic narcotic use     Chronic obstructive pulmonary disease  (HCC)     Chronic pain     CKD (chronic kidney disease), stage III (HCC)     Baseline creatinine is 1.3-1.4 with GFR in the 40s.    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)     GERD (gastroesophageal reflux disease)     History of vascular access device 04/13/2021    4 FR Single PICC for LTABX: R cephalic vessell length 48 CM Max P leave @ 1 CM out; Arm circumferenc 40 CM    Hyperlipidemia     Hypothyroidism     Morbid obesity ([place])     Neuropathy     Obstructive sleep apnea     Rhinitis       Past Surgical History:   Procedure Laterality Date    AORTIC VALVE REPLACEMENT      Bovine bioprosthetic    CARDIAC PROCEDURE N/A 08/30/2021    Intracardiac echocardiogram performed by Shelle Iron, MD at [facility] CATH [facility]    COLONOSCOPY N/A 12/01/2020    COLONOSCOPY performed by Rachael Darby, MD at [facility] N/A 08/30/2021    Ablation PVC performed by Shelle Iron, MD at [facility] CATH [facility]    EP DEVICE PROCEDURE N/A 08/30/2021    Drug stimulation performed by Shelle Iron, MD at [facility] CATH [facility]    EP DEVICE PROCEDURE N/A 08/30/2021    Transeptal puncture performed by Shelle Iron, MD at [place] CATH [facility]    EP DEVICE PROCEDURE N/A 08/30/2021    Ep 3d mapping performed by Shelle Iron, MD at [place] CATH LAB    INVASIVE VASCULAR N/A 08/30/2021    Ultrasound guided vascular access performed by Shelle Iron, MD at [place] CATH [facility]    PACEMAKER       Social History     Tobacco Use    Smoking status: Former     Packs/day: 1.00     Types: Cigarettes     Quit date: 03/21/2010     Years since quitting: 11.5    Smokeless tobacco: Never   Substance Use Topics    Alcohol use: Not Currently      Family History   Problem Relation Age of Onset    Hypertension Mother     Hypertension Father         Allergies   Allergen Reactions    Nitroglycerin      Other reaction(s): Unknown (comments)  hypotension    Hydrochlorothiazide Other (See Comments)     Reports 'kidneys dry up"     Tetanus Toxoids Swelling    Aloe Vera  Rash  Prior to Admission medications    Medication Sig Start Date End Date Taking? Authorizing Provider   amiodarone (CORDARONE) 200 MG tablet Take 1 tablet by mouth daily   Yes Historical Provider, MD   bumetanide (BUMEX) 1 MG tablet Take 1 tablet by mouth four times a week 61m daily on Tu, Th, Sat, Sun   Yes Historical Provider, MD   omeprazole (PRILOSEC) 40 MG delayed release capsule Take 1 capsule by mouth daily 09/17/21   Historical Provider, MD   traMADol (ULTRAM) 50 MG tablet Take 1 tablet by mouth as needed. 09/17/21   Historical Provider, MD   mexiletine (MEXITIL) 150 MG capsule Take 1 capsule by mouth 3 times daily 08/27/21   KGabriel Rung APRN - NP   diclofenac (VOLTAREN) 50 MG EC tablet Take 1 tablet by mouth daily  Patient not taking: Reported on 09/27/2021 06/19/21   Historical Provider, MD   gabapentin (NEURONTIN) 100 MG capsule Take 1 capsule by mouth 3 times daily for 60 days. Max Daily Amount: 300 mg 06/28/21 10/02/21  KBrynda Greathouse MD   Probiotic Product (ACIDOPHILUS PROBIOTIC) CAPS capsule Take 1 capsule by mouth daily 06/29/21   KMariana KaufmanDo, MD   bumetanide (BUMEX) 1 MG tablet Take 1 tablet by mouth 2 times daily  Patient taking differently: Take 1 tablet by mouth three times a week Taking twice a day on MWF 06/28/21   Khoi B Do, MD   potassium chloride (KLOR-CON M) 20 MEQ extended release tablet Take 1 tablet by mouth daily 06/28/21   KMariana KaufmanDo, MD   midodrine (PROAMATINE) 10 MG tablet Take 1 tablet by mouth 3 times daily 06/28/21   Khoi B Do, MD   glipiZIDE (GLUCOTROL) 5 MG tablet Take 1 tablet by mouth 2 times daily (before meals)    Historical Provider, MD   albuterol sulfate HFA (PROVENTIL;VENTOLIN;PROAIR) 108 (90 Base) MCG/ACT inhaler Inhale 2 puffs into the lungs every 6 hours as needed    Ar Automatic Reconciliation   aspirin 81 MG chewable tablet Take 1 tablet by mouth daily 12/03/20   Ar Automatic Reconciliation   atorvastatin (LIPITOR) 10 MG tablet Take 1 tablet by mouth daily    Ar Automatic  Reconciliation   citalopram (CELEXA) 40 MG tablet Take 1 tablet by mouth daily 02/22/20   Ar Automatic Reconciliation   fluticasone (FLONASE) 50 MCG/ACT nasal spray 2 sprays by Nasal route daily as needed    Ar Automatic Reconciliation   levothyroxine (SYNTHROID) 150 MCG tablet Take 1 tablet by mouth every morning (before breakfast) 01/27/21   Ar Automatic Reconciliation   miconazole (MICOTIN) 2 % powder Apply topically as needed 04/24/21   Ar Automatic Reconciliation   montelukast (SINGULAIR) 10 MG tablet Take 1 tablet by mouth nightly    Ar Automatic Reconciliation   polyethylene glycol (GLYCOLAX) 17 GM/SCOOP powder Take 17 g by mouth daily as needed 04/24/21   Ar Automatic Reconciliation   sucralfate (CARAFATE) 1 GM tablet Take 1 tablet by mouth 3 times daily    Ar Automatic Reconciliation   traZODone (DESYREL) 100 MG tablet Take 1 tablet by mouth nightly 12/31/19   Ar Automatic Reconciliation       REVIEW OF SYSTEMS:  See HPI for details   POSITIVE= Bold.  Negative = normal text  General:  fever, chills, sweats, generalized weakness, weight loss/gain, loss of appetite  Eyes:  blurred vision, eye pain, loss of vision, diplopia  Ear Nose and Throat:  rhinorrhea, pharyngitis  Respiratory:   cough, sputum production, SOB, wheezing, DOE, pleuritic pain  Cardiology:  chest pain, palpitations, orthopnea, PND, edema, syncope   Gastrointestinal:  abdominal pain, N/V, dysphagia, diarrhea, constipation, bleeding  Genitourinary:  frequency, urgency, dysuria, hematuria, incontinence  Muskuloskeletal :  arthralgia--neck and low back, myalgia  Hematology:  easy bruising, bleeding, lymphadenopathy  Dermatological:  rash, ulceration, pruritis  Endocrine:  hot flashes or polydipsia  Neurological:  headache, dizziness, confusion, focal weakness, paresthesia, memory loss, gait disturbance  Psychological: anxiety, depression, agitation      Objective:   VITALS:      10/01/21 2254 (!) 128/57 98.8 F (37.1 C) Oral 74 17 93 % -- --    10/01/21 1833 (!) 118/55 99.1 F (37.3 C) Oral 72 20 95 % 1.702 m (_0 ) 133.8 kg (295 lb)       Temp (24hrs), Avg:99 F (37.2 C), Min:98.8 F (37.1 C), Max:99.1 F (37.3 C)           Wt Readings from Last 12 Encounters:   10/01/21 133.8 kg (295 lb)   09/27/21 133.6 kg (294 lb 9.6 oz)   08/30/21 136.1 kg (300 lb)   08/19/21 136.1 kg (300 lb)   08/14/21 (!) 136.5 kg (301 lb)   08/09/21 (!) 136.3 kg (300 lb 6.4 oz)   07/06/21 133.8 kg (295 lb)   07/04/21 134.3 kg (296 lb)   06/25/21 (!) 153.6 kg (338 lb 10 oz)   06/16/21 (!) 147.9 kg (326 lb)   05/28/21 (!) 152.5 kg (336 lb 3.2 oz)   04/24/21 (!) 160.8 kg (354 lb 8 oz)     Body mass index is 46.2 kg/m.    PHYSICAL EXAM:    Gen: Obese, chronically ill appearing female, Well-developed, well-nourished, in no acute distress  HEENT:  Pink conjunctivae, PERRL, hearing intact to voice, moist mucous membranes  Neck: Supple, without masses, thyroid non-tender  Resp: No accessory muscle use, clear breath sounds without wheezes rales or rhonchi  Card: No murmurs, normal S1, S2 without thrills, bruits or peripheral edema  Abd:  Obese, Soft, non-tender, non-distended, normoactive bowel sounds are present, no palpable organomegaly and no detectable hernias  Lymph:  No cervical or inguinal adenopathy  Musc: No cyanosis or clubbing.  Pain in L3-L4 to palpation.  Pain in medial right mid foot to palpation with mild focal swelling  Skin: Shallow ulcer/scab in right distal lower leg.  No rashes or ulcers, skin turgor is good  Neuro:  Cranial nerves are grossly intact, arms 4/5 limited by low back pain, legs 3/5, follows commands appropriately  Psych:  Good insight, oriented to person, place and time, alert          _______________________________________________________________________  Care Plan discussed with:    Comments   Patient y Discussed with patient in room. POC outlined and Questions answered    Family      RN x    Care Manager                    Consultant:  x ED MD    _______________________________________________________________________  Recommended Disposition: home with PT vs. SNF  Home with Family    HH/PT/OT/RN    SNF/LTC    [facility]    ________________________________________________________________________  TOTAL TIME:  65 Minutes        Comments   >50% of visit spent in counseling and coordination of care  Chart reviewed  Discussion with patient and/or family and questions answered  LAB DATA REVIEWED:    Recent Results (from the past 12 hour(s))   CBC with Auto Differential    Collection Time: 10/01/21  7:20 PM   Result Value Ref Range    WBC 6.0 3.6 - 11.0 K/uL    RBC 3.61 (L) 3.80 - 5.20 M/uL    Hemoglobin 9.6 (L) 11.5 - 16.0 g/dL    Hematocrit 31.8 (L) 35.0 - 47.0 %    MCV 88.1 80.0 - 99.0 FL    MCH 26.6 26.0 - 34.0 PG    MCHC 30.2 30.0 - 36.5 g/dL    RDW 14.5 11.5 - 14.5 %    Platelets 121 (L) 150 - 400 K/uL    MPV 11.4 8.9 - 12.9 FL    Nucleated RBCs 0.0 0 PER 100 WBC    nRBC 0.00 0.00 - 0.01 K/uL    Neutrophils % 60 32 - 75 %    Lymphocytes % 29 12 - 49 %    Monocytes % 8 5 - 13 %    Eosinophils % 3 0 - 7 %    Basophils % 1 0 - 1 %    Immature Granulocytes 0 0.0 - 0.5 %    Neutrophils Absolute 3.6 1.8 - 8.0 K/UL    Lymphocytes Absolute 1.8 0.8 - 3.5 K/UL    Monocytes Absolute 0.5 0.0 - 1.0 K/UL    Eosinophils Absolute 0.2 0.0 - 0.4 K/UL    Basophils Absolute 0.0 0.0 - 0.1 K/UL    Absolute Immature Granulocyte 0.0 0.00 - 0.04 K/UL    Differential Type AUTOMATED     CMP    Collection Time: 10/01/21  7:20 PM   Result Value Ref Range    Sodium 140 136 - 145 mmol/L    Potassium 3.6 3.5 - 5.1 mmol/L    Chloride 103 97 - 108 mmol/L    CO2 34 (H) 21 - 32 mmol/L    Anion Gap 3 (L) 5 - 15 mmol/L    Glucose 111 (H) 65 - 100 mg/dL    BUN 17 6 - 20 MG/DL    Creatinine 1.32 (H) 0.55 - 1.02 MG/DL    Bun/Cre Ratio 13 12 - 20      Est, Glom Filt Rate 45 (L) >60 ml/min/1.42m    Calcium 8.7 8.5 - 10.1 MG/DL    Total Bilirubin 1.6 (H) 0.2 - 1.0 MG/DL    ALT 20 12 - 78 U/L    AST 17  15 - 37 U/L    Alk Phosphatase 92 45 - 117 U/L    Total Protein 7.2 6.4 - 8.2 g/dL    Albumin 3.3 (L) 3.5 - 5.0 g/dL    Globulin 3.9 2.0 - 4.0 g/dL    Albumin/Globulin Ratio 0.8 (L) 1.1 - 2.2     Magnesium    Collection Time: 10/01/21  7:20 PM   Result Value Ref Range    Magnesium 1.4 (L) 1.6 - 2.4 mg/dL   Extra Tubes Hold    Collection Time: 10/01/21  7:20 PM   Result Value Ref Range    Specimen HOld SST,BLUE,RED,DK GRN     Comment:        Add-on orders for these samples will be processed based on acceptable specimen integrity and analyte stability, which may vary by analyte.         CT Result (most recent):  CT LUMBAR SPINE WO CONTRAST 10/01/2021    Narrative  EXAM:  CT LUMBAR SPINE  WITHOUT  CONTRAST    INDICATION: Low back pain after fall    COMPARISON: CT 08/19/2021.    CONTRAST:  None.    TECHNIQUE: Multislice helical CT of the lumbar spine was performed without  intravenous contrast administration.  Coronal and sagittal reformats were  generated.  CT dose reduction was achieved through use of a standardized  protocol tailored for this examination and automatic exposure control for dose  modulation.    FINDINGS:  There is no acute fracture or subluxation. Vertebral body heights are  maintained. There are multilevel degenerative disc and facet changes  superimposed on a developmentally narrowed spinal canal. There is multilevel  spinal canal and bilateral neural foraminal stenosis most prominent at L1-2  where there is severe spinal canal stenosis with moderate to severe left and  severe right neural foraminal stenosis. There is no abnormality in alignment.  The paraspinal soft tissues are unremarkable. An IVC filter is present.    Impression  No acute abnormality. Multilevel degenerative changes most prominent at L1-2  where there is severe spinal canal stenosis with moderate to severe left and  severe right neural foraminal stenosis.        Xray Result (most recent):  XR HAND LEFT (MIN 3 VIEWS)  10/01/2021    Narrative  EXAM: XR HAND LEFT (MIN 3 VIEWS)    INDICATION: Left hand pain after fall.    COMPARISON: None.    FINDINGS: Three views of the left hand demonstrate no acute fracture or  dislocation. There is mild diffuse joint space narrowing of the digits and first  CMC joint. The soft tissues are unremarkable.    Impression  No acute abnormality. Diffuse degenerative changes in keeping with  osteoarthritis.      ________________________________________________________________________  Signed: Malachi Paradise, MD        Procedures: see electronic medical records for all procedures/Xrays/labs and details which were not copied into this note but were reviewed prior to creation of Plan.

## 2021-10-01 NOTE — ED Notes (Signed)
Report called to Adreen     Cheryl Flash, RN  10/01/21 2203

## 2021-10-01 NOTE — ED Provider Notes (Signed)
[facility] EMERGENCY DEPT  EMERGENCY DEPARTMENT ENCOUNTER      Pt Name: Deborah [name]  MRN: [id number]  Birthdate [birth year]  Date of evaluation: 10/01/2021  Provider: Terrall Laity, MD      HISTORY OF PRESENT ILLNESS      66 year old female with history of HFpEF, aortic valve replacement, CKD, COPD, chronic pain, GERD, diabetes, morbid obesity presents to the emergency department chief complaint of falls.  She reports recent A-fib ablation with Dr. Loma Newton at [facility] but since that time she has been having episodes of lightheadedness and falls which come on very rapidly without chest pain or pressure or shortness of breath.  Today she reports that she turned the corner at her house and had 1 of these episodes and fell face first into the couch, hitting her head on the side of the couch.  She does not think she injured herself but does have some pain in her left hand and lumbar spine.    The history is provided by the patient and medical records.         Nursing Notes were reviewed.    REVIEW OF SYSTEMS         Review of Systems        PAST MEDICAL HISTORY     Past Medical History:   Diagnosis Date    (HFpEF) heart failure with preserved ejection fraction (HCC)     Anxiety and depression     Aortic valve replaced     S/p bovine aortic valve replacement.    Asthma     Chronic narcotic use     Chronic obstructive pulmonary disease (HCC)     Chronic pain     CKD (chronic kidney disease), stage III (HCC)     Baseline creatinine is 1.3-1.4 with GFR in the 40s.    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 CARDIAC CATH LAB    COLONOSCOPY N/A 12/01/2020    COLONOSCOPY performed by Ian Malkin, MD at [facility] ENDOSCOPY    EP DEVICE PROCEDURE N/A 08/30/2021    Ablation PVC performed by Thurston Pounds, MD at [facility] CARDIAC CATH LAB    EP DEVICE PROCEDURE N/A 08/30/2021    Drug stimulation performed by Thurston Pounds, MD at [facility] CARDIAC CATH LAB    EP DEVICE PROCEDURE N/A 08/30/2021    Transeptal puncture performed by Thurston Pounds, MD at [facility] CARDIAC CATH LAB    EP DEVICE PROCEDURE N/A 08/30/2021    Ep 3d mapping performed by Thurston Pounds, MD at [facility] CARDIAC CATH LAB    INVASIVE VASCULAR N/A 08/30/2021    Ultrasound guided vascular access performed by Thurston Pounds, MD at [facility] CARDIAC CATH LAB    PACEMAKER  CURRENT MEDICATIONS       Previous Medications    ALBUTEROL SULFATE HFA (PROVENTIL;VENTOLIN;PROAIR) 108 (90 BASE) MCG/ACT INHALER    Inhale 2 puffs into the lungs every 6 hours as needed    ASPIRIN 81 MG CHEWABLE TABLET    Take 1 tablet by mouth daily    ATORVASTATIN (LIPITOR) 10 MG TABLET    Take 1 tablet by mouth daily    BUMETANIDE (BUMEX) 1 MG TABLET    Take 1 tablet by mouth 2 times daily    CITALOPRAM (CELEXA) 40 MG TABLET    Take 1 tablet by mouth daily    DICLOFENAC (VOLTAREN) 50 MG EC TABLET    Take 1 tablet by mouth daily    FLUTICASONE (FLONASE) 50 MCG/ACT NASAL SPRAY    2 sprays by Nasal route daily as needed    GABAPENTIN (NEURONTIN) 100 MG CAPSULE    Take 1 capsule by mouth 3 times daily for 60 days. Max Daily Amount: 300 mg    GLIPIZIDE (GLUCOTROL) 5 MG TABLET    Take 1 tablet by mouth 2 times daily (before meals)    LEVOTHYROXINE (SYNTHROID) 150 MCG TABLET    Take 1 tablet by mouth every morning (before breakfast)    MEXILETINE (MEXITIL) 150 MG CAPSULE    Take 1 capsule by mouth 3 times daily    MICONAZOLE (MICOTIN) 2 % POWDER    Apply topically as needed    MIDODRINE (PROAMATINE) 10 MG TABLET    Take 1  tablet by mouth 3 times daily    MONTELUKAST (SINGULAIR) 10 MG TABLET    Take 1 tablet by mouth nightly    OMEPRAZOLE (PRILOSEC) 40 MG DELAYED RELEASE CAPSULE    Take 1 capsule by mouth daily    POLYETHYLENE GLYCOL (GLYCOLAX) 17 GM/SCOOP POWDER    Take 17 g by mouth daily as needed    POTASSIUM CHLORIDE (KLOR-CON M) 20 MEQ EXTENDED RELEASE TABLET    Take 1 tablet by mouth daily    PROBIOTIC PRODUCT (ACIDOPHILUS PROBIOTIC) CAPS CAPSULE    Take 1 capsule by mouth daily    SUCRALFATE (CARAFATE) 1 GM TABLET    Take 1 tablet by mouth 3 times daily    TRAMADOL (ULTRAM) 50 MG TABLET    Take 1 tablet by mouth as needed.    TRAZODONE (DESYREL) 100 MG TABLET    Take 1 tablet by mouth nightly       ALLERGIES     Nitroglycerin, Hydrochlorothiazide, Tetanus toxoids, and Aloe vera    FAMILY HISTORY       Family History   Problem Relation Age of Onset    Hypertension Mother     Hypertension Father           SOCIAL HISTORY       Social History     Socioeconomic History    Marital status: Widowed   Tobacco Use    Smoking status: Former     Packs/day: 1.00     Types: Cigarettes     Quit date: 03/21/2010     Years since quitting: 11.5    Smokeless tobacco: Never   Vaping Use    Vaping Use: Never used   Substance and Sexual Activity    Alcohol use: Not Currently    Drug use: Never         PHYSICAL EXAM       ED Triage Vitals   BP Temp Temp src Pulse Resp SpO2 Height Weight   -- -- -- -- -- -- -- --  There is no height or weight on file to calculate BMI.    Physical Exam  Constitutional:       Appearance: She is obese.   Cardiovascular:      Rate and Rhythm: Normal rate.   Pulmonary:      Effort: Pulmonary effort is normal.   Musculoskeletal:         General: No deformity or signs of injury.   Neurological:      Mental Status: She is alert.           EMERGENCY DEPARTMENT COURSE and DIFFERENTIAL DIAGNOSIS/MDM:   Vitals:  There were no vitals filed for this visit.      Medical Decision Making  66 year old female presents to the  emergency department as above with frequent falls.  She is found to be hypomagnesemic, sinus rhythm with slightly prolonged PR interval.  Given her recent ablation and sudden onset of symptoms I am concerned for episodes of arrhythmia.  Will admit to the hospital for further management.    Amount and/or Complexity of Data Reviewed  External Data Reviewed: ECG and notes.  Labs: ordered.  Radiology: ordered and independent interpretation performed. Decision-making details documented in ED Course.  ECG/medicine tests: ordered and independent interpretation performed. Decision-making details documented in ED Course.  Discussion of management or test interpretation with external provider(s): Hospitalist    Risk  Decision regarding hospitalization.            REASSESSMENT     ED Course as of 10/01/21 2030   Mon Oct 01, 2021   1610 ED EKG interpretation: Rhythm: Sinus rhythm with first-degree AV block. Rate: 71. Axis: Normal axis. ST Segment: No concerning ST segment changes. This EKG was interpreted realtime by Louann Sjogren MD, ED physician [JM]   [phone] I have independently viewed the obtained radiographic images and note left hand x-ray without acute findings. Will await radiology read. [JM]      ED Course User Index  [JM] Terrall Laity, MD         CONSULTS:  None    PROCEDURES:     Procedures      Perfect Serve Consult for Admission  8:31 PM    ED Room Number: CW/CW  Patient Name and age:  Deborah [name] 66 y.o.  female  Working Diagnosis:   1. Fall, initial encounter    2. Acute exacerbation of chronic low back pain    3. Hypomagnesemia        COVID-19 Suspicion: No  Sepsis present:  No    Code Status:  Full Code  Readmission: No  Isolation Requirements: no  Recommended Level of Care: telemetry  Department: [facility] - [phone]  Consulting Provider: Dr  Cyndie Chime    Other: Recent A-fib ablation, concern for cardiogenic etiology for recurrent syncopal events/falls          (Please note that portions of this  note were completed with a voice recognition program.  Efforts were made to edit the dictations but occasionally words are mis-transcribed.)    Terrall Laity, MD (electronically signed)  Emergency Attending Physician              Terrall Laity, MD  10/01/21 2032

## 2021-10-02 ENCOUNTER — Inpatient Hospital Stay: Admit: 2021-10-02 | Payer: MEDICARE | Primary: Family

## 2021-10-02 LAB — CBC WITH AUTO DIFFERENTIAL
Absolute Immature Granulocyte: 0 10*3/uL (ref 0.00–0.04)
Absolute Immature Granulocyte: 0 10*3/uL (ref 0.00–0.04)
Basophils %: 1 % (ref 0–1)
Basophils %: 1 % (ref 0–1)
Basophils Absolute: 0 10*3/uL (ref 0.0–0.1)
Basophils Absolute: 0 10*3/uL (ref 0.0–0.1)
Eosinophils %: 3 % (ref 0–7)
Eosinophils %: 3 % (ref 0–7)
Eosinophils Absolute: 0.2 10*3/uL (ref 0.0–0.4)
Eosinophils Absolute: 0.2 10*3/uL (ref 0.0–0.4)
Hematocrit: 30.1 % — ABNORMAL LOW (ref 35.0–47.0)
Hematocrit: 31.8 % — ABNORMAL LOW (ref 35.0–47.0)
Hemoglobin: 9.2 g/dL — ABNORMAL LOW (ref 11.5–16.0)
Hemoglobin: 9.6 g/dL — ABNORMAL LOW (ref 11.5–16.0)
Immature Granulocytes: 0 % (ref 0.0–0.5)
Immature Granulocytes: 0 % (ref 0.0–0.5)
Lymphocytes %: 29 % (ref 12–49)
Lymphocytes %: 38 % (ref 12–49)
Lymphocytes Absolute: 1.8 10*3/uL (ref 0.8–3.5)
Lymphocytes Absolute: 2.3 10*3/uL (ref 0.8–3.5)
MCH: 26.6 PG (ref 26.0–34.0)
MCH: 26.7 PG (ref 26.0–34.0)
MCHC: 30.2 g/dL (ref 30.0–36.5)
MCHC: 30.6 g/dL (ref 30.0–36.5)
MCV: 87.5 FL (ref 80.0–99.0)
MCV: 88.1 FL (ref 80.0–99.0)
MPV: 11.1 FL (ref 8.9–12.9)
MPV: 11.4 FL (ref 8.9–12.9)
Monocytes %: 7 % (ref 5–13)
Monocytes %: 8 % (ref 5–13)
Monocytes Absolute: 0.4 10*3/uL (ref 0.0–1.0)
Monocytes Absolute: 0.5 10*3/uL (ref 0.0–1.0)
Neutrophils %: 51 % (ref 32–75)
Neutrophils %: 60 % (ref 32–75)
Neutrophils Absolute: 3.2 10*3/uL (ref 1.8–8.0)
Neutrophils Absolute: 3.6 10*3/uL (ref 1.8–8.0)
Nucleated RBCs: 0 PER 100 WBC
Nucleated RBCs: 0 PER 100 WBC
Platelets: 113 10*3/uL — ABNORMAL LOW (ref 150–400)
Platelets: 121 10*3/uL — ABNORMAL LOW (ref 150–400)
RBC: 3.44 M/uL — ABNORMAL LOW (ref 3.80–5.20)
RBC: 3.61 M/uL — ABNORMAL LOW (ref 3.80–5.20)
RDW: 14.5 % (ref 11.5–14.5)
RDW: 14.6 % — ABNORMAL HIGH (ref 11.5–14.5)
WBC: 6 10*3/uL (ref 3.6–11.0)
WBC: 6.1 10*3/uL (ref 3.6–11.0)
nRBC: 0 10*3/uL (ref 0.00–0.01)
nRBC: 0 10*3/uL (ref 0.00–0.01)

## 2021-10-02 LAB — BASIC METABOLIC PANEL W/ REFLEX TO MG FOR LOW K
Anion Gap: 4 mmol/L — ABNORMAL LOW (ref 5–15)
BUN: 16 MG/DL (ref 6–20)
Bun/Cre Ratio: 13 (ref 12–20)
CO2: 32 mmol/L (ref 21–32)
Calcium: 8.6 MG/DL (ref 8.5–10.1)
Chloride: 103 mmol/L (ref 97–108)
Creatinine: 1.23 MG/DL — ABNORMAL HIGH (ref 0.55–1.02)
Est, Glom Filt Rate: 49 mL/min/{1.73_m2} — ABNORMAL LOW (ref >60)
Glucose: 159 mg/dL — ABNORMAL HIGH (ref 65–100)
Potassium: 3.3 mmol/L — ABNORMAL LOW (ref 3.5–5.1)
Sodium: 139 mmol/L (ref 136–145)

## 2021-10-02 LAB — COMPREHENSIVE METABOLIC PANEL
ALT: 20 U/L (ref 12–78)
AST: 17 U/L (ref 15–37)
Albumin/Globulin Ratio: 0.8 — ABNORMAL LOW (ref 1.1–2.2)
Albumin: 3.3 g/dL — ABNORMAL LOW (ref 3.5–5.0)
Alk Phosphatase: 92 U/L (ref 45–117)
Anion Gap: 3 mmol/L — ABNORMAL LOW (ref 5–15)
BUN: 17 MG/DL (ref 6–20)
Bun/Cre Ratio: 13 (ref 12–20)
CO2: 34 mmol/L — ABNORMAL HIGH (ref 21–32)
Calcium: 8.7 MG/DL (ref 8.5–10.1)
Chloride: 103 mmol/L (ref 97–108)
Creatinine: 1.32 MG/DL — ABNORMAL HIGH (ref 0.55–1.02)
Est, Glom Filt Rate: 45 mL/min/{1.73_m2} — ABNORMAL LOW (ref >60)
Globulin: 3.9 g/dL (ref 2.0–4.0)
Glucose: 111 mg/dL — ABNORMAL HIGH (ref 65–100)
Potassium: 3.6 mmol/L (ref 3.5–5.1)
Sodium: 140 mmol/L (ref 136–145)
Total Bilirubin: 1.6 MG/DL — ABNORMAL HIGH (ref 0.2–1.0)
Total Protein: 7.2 g/dL (ref 6.4–8.2)

## 2021-10-02 LAB — POCT GLUCOSE
POC Glucose: 158 mg/dL — ABNORMAL HIGH (ref 65–117)
POC Glucose: 147 mg/dL — ABNORMAL HIGH (ref 65–117)
POC Glucose: 112 mg/dL (ref 65–117)

## 2021-10-02 LAB — MAGNESIUM
Magnesium: 1.4 mg/dL — ABNORMAL LOW (ref 1.6–2.4)
Magnesium: 1.4 mg/dL — ABNORMAL LOW (ref 1.6–2.4)

## 2021-10-02 LAB — EKG 12-LEAD
Atrial Rate: 71 {beats}/min
P Axis: 31 degrees
P-R Interval: 216 ms
Q-T Interval: 386 ms
QRS Duration: 110 ms
QTc Calculation (Bazett): 419 ms
R Axis: -54 degrees
T Axis: 56 degrees
Ventricular Rate: 71 {beats}/min

## 2021-10-02 MED ORDER — SUCRALFATE 1 G PO TABS
1 GM | Freq: Three times a day (TID) | ORAL | Status: AC
Start: 2021-10-02 — End: 2021-10-05
  Administered 2021-10-02 – 2021-10-05 (×11): 1 g via ORAL

## 2021-10-02 MED ORDER — BUMETANIDE 1 MG PO TABS
1 MG | ORAL | Status: AC
Start: 2021-10-02 — End: 2021-10-04
  Administered 2021-10-03: 13:00:00 1 mg via ORAL

## 2021-10-02 MED ORDER — ENOXAPARIN SODIUM 30 MG/0.3ML IJ SOSY
30 MG/0.3ML | Freq: Two times a day (BID) | INTRAMUSCULAR | Status: AC
Start: 2021-10-02 — End: 2021-10-05
  Administered 2021-10-02 – 2021-10-05 (×8): 30 mg via SUBCUTANEOUS

## 2021-10-02 MED ORDER — MICONAZOLE NITRATE 2 % EX POWD
2 % | CUTANEOUS | Status: AC | PRN
Start: 2021-10-02 — End: 2021-10-05

## 2021-10-02 MED ORDER — HYDROMORPHONE 0.5MG/0.5ML IJ SOLN
1 MG/ML | Status: DC | PRN
Start: 2021-10-02 — End: 2021-10-05
  Administered 2021-10-02 – 2021-10-05 (×7): 0.5 mg via INTRAVENOUS

## 2021-10-02 MED ORDER — MEXILETINE HCL 150 MG PO CAPS
150 MG | Freq: Three times a day (TID) | ORAL | Status: AC
Start: 2021-10-02 — End: 2021-10-03
  Administered 2021-10-02 (×2): 150 mg via ORAL

## 2021-10-02 MED ORDER — NORMAL SALINE FLUSH 0.9 % IV SOLN
0.9 % | Freq: Two times a day (BID) | INTRAVENOUS | Status: AC
Start: 2021-10-02 — End: 2021-10-05
  Administered 2021-10-02 – 2021-10-05 (×8): 10 mL via INTRAVENOUS

## 2021-10-02 MED ORDER — POLYETHYLENE GLYCOL 3350 17 G PO PACK
17 g | Freq: Every day | ORAL | Status: AC | PRN
Start: 2021-10-02 — End: 2021-10-02

## 2021-10-02 MED ORDER — GLUCAGON (RDNA) 1 MG IJ KIT
1 MG | INTRAMUSCULAR | Status: AC | PRN
Start: 2021-10-02 — End: 2021-10-05

## 2021-10-02 MED ORDER — POTASSIUM CHLORIDE ER 10 MEQ PO TBCR
10 MEQ | Freq: Once | ORAL | Status: AC
Start: 2021-10-02 — End: 2021-10-02
  Administered 2021-10-02: 13:00:00 40 meq via ORAL

## 2021-10-02 MED ORDER — NORMAL SALINE FLUSH 0.9 % IV SOLN
0.9 % | INTRAVENOUS | Status: AC | PRN
Start: 2021-10-02 — End: 2021-10-05

## 2021-10-02 MED ORDER — PANTOPRAZOLE SODIUM 40 MG PO TBEC
40 MG | Freq: Every day | ORAL | Status: AC
Start: 2021-10-02 — End: 2021-10-03
  Administered 2021-10-02 – 2021-10-03 (×2): 40 mg via ORAL

## 2021-10-02 MED ORDER — ONDANSETRON HCL 4 MG/2ML IJ SOLN
4 MG/2ML | Freq: Four times a day (QID) | INTRAMUSCULAR | Status: AC | PRN
Start: 2021-10-02 — End: 2021-10-05

## 2021-10-02 MED ORDER — INSULIN LISPRO 100 UNIT/ML IJ SOLN
100 UNIT/ML | Freq: Three times a day (TID) | INTRAMUSCULAR | Status: AC
Start: 2021-10-02 — End: 2021-10-05

## 2021-10-02 MED ORDER — ATORVASTATIN CALCIUM 10 MG PO TABS
10 MG | Freq: Every day | ORAL | Status: AC
Start: 2021-10-02 — End: 2021-10-05
  Administered 2021-10-03 – 2021-10-05 (×3): 10 mg via ORAL

## 2021-10-02 MED ORDER — ALIGN PO CAPS
Freq: Every day | ORAL | Status: AC
Start: 2021-10-02 — End: 2021-10-05
  Administered 2021-10-02 – 2021-10-05 (×4): 1 via ORAL

## 2021-10-02 MED ORDER — GLUCOSE 4 G PO CHEW
4 g | ORAL | Status: AC | PRN
Start: 2021-10-02 — End: 2021-10-05

## 2021-10-02 MED ORDER — DEXTROSE 10 % IV BOLUS
INTRAVENOUS | Status: AC | PRN
Start: 2021-10-02 — End: 2021-10-05

## 2021-10-02 MED ORDER — BUMETANIDE 1 MG PO TABS
1 MG | ORAL | Status: AC
Start: 2021-10-02 — End: 2021-10-04

## 2021-10-02 MED ORDER — AMIODARONE HCL 200 MG PO TABS
200 MG | Freq: Every day | ORAL | Status: AC
Start: 2021-10-02 — End: 2021-10-05
  Administered 2021-10-02 – 2021-10-05 (×4): 200 mg via ORAL

## 2021-10-02 MED ORDER — POLYETHYLENE GLYCOL 3350 17 G PO PACK
17 g | Freq: Every day | ORAL | Status: AC | PRN
Start: 2021-10-02 — End: 2021-10-05

## 2021-10-02 MED ORDER — LEVOTHYROXINE SODIUM 75 MCG PO TABS
75 MCG | Freq: Every day | ORAL | Status: AC
Start: 2021-10-02 — End: 2021-10-05
  Administered 2021-10-02 – 2021-10-05 (×4): 150 ug via ORAL

## 2021-10-02 MED ORDER — ACETAMINOPHEN 325 MG PO TABS
325 | Freq: Four times a day (QID) | ORAL | Status: DC | PRN
Start: 2021-10-02 — End: 2021-10-05

## 2021-10-02 MED ORDER — MONTELUKAST SODIUM 10 MG PO TABS
10 MG | Freq: Every evening | ORAL | Status: AC
Start: 2021-10-02 — End: 2021-10-05
  Administered 2021-10-02 – 2021-10-05 (×4): 10 mg via ORAL

## 2021-10-02 MED ORDER — TRAMADOL HCL 50 MG PO TABS
50 | Freq: Four times a day (QID) | ORAL | Status: DC | PRN
Start: 2021-10-02 — End: 2021-10-05
  Administered 2021-10-02 – 2021-10-03 (×2): 50 mg via ORAL

## 2021-10-02 MED ORDER — MIDODRINE HCL 5 MG PO TABS
5 MG | Freq: Three times a day (TID) | ORAL | Status: AC
Start: 2021-10-02 — End: 2021-10-05
  Administered 2021-10-02 – 2021-10-05 (×11): 10 mg via ORAL

## 2021-10-02 MED ORDER — BUMETANIDE 1 MG PO TABS
1 MG | ORAL | Status: AC
Start: 2021-10-02 — End: 2021-10-04
  Administered 2021-10-02: 13:00:00 1 mg via ORAL

## 2021-10-02 MED ORDER — CYCLOBENZAPRINE HCL 10 MG PO TABS
10 MG | Freq: Three times a day (TID) | ORAL | Status: DC | PRN
Start: 2021-10-02 — End: 2021-10-05
  Administered 2021-10-03: 02:00:00 5 mg via ORAL

## 2021-10-02 MED ORDER — SODIUM CHLORIDE 0.9 % IV SOLN
0.9 % | INTRAVENOUS | Status: AC | PRN
Start: 2021-10-02 — End: 2021-10-05

## 2021-10-02 MED ORDER — ONDANSETRON 4 MG PO TBDP
4 MG | Freq: Three times a day (TID) | ORAL | Status: AC | PRN
Start: 2021-10-02 — End: 2021-10-05

## 2021-10-02 MED ORDER — FLUTICASONE PROPIONATE 50 MCG/ACT NA SUSP
50 MCG/ACT | Freq: Every day | NASAL | Status: AC | PRN
Start: 2021-10-02 — End: 2021-10-05

## 2021-10-02 MED ORDER — MAGNESIUM SULFATE IN D5W 1-5 GM/100ML-% IV SOLN
1-5100- GM/100ML-% | Freq: Once | INTRAVENOUS | Status: AC
Start: 2021-10-02 — End: 2021-10-02
  Administered 2021-10-02: 13:00:00 1000 mg via INTRAVENOUS

## 2021-10-02 MED ORDER — GLIPIZIDE 5 MG PO TABS
5 MG | Freq: Two times a day (BID) | ORAL | Status: DC
Start: 2021-10-02 — End: 2021-10-02

## 2021-10-02 MED ORDER — INSULIN LISPRO 100 UNIT/ML IJ SOLN
100 UNIT/ML | Freq: Every evening | INTRAMUSCULAR | Status: AC
Start: 2021-10-02 — End: 2021-10-05

## 2021-10-02 MED ORDER — ACETAMINOPHEN 650 MG RE SUPP
650 | Freq: Four times a day (QID) | RECTAL | Status: DC | PRN
Start: 2021-10-02 — End: 2021-10-05

## 2021-10-02 MED ORDER — GLIPIZIDE 5 MG PO TABS
5 MG | Freq: Every day | ORAL | Status: DC
Start: 2021-10-02 — End: 2021-10-05
  Administered 2021-10-02 – 2021-10-05 (×4): 5 mg via ORAL

## 2021-10-02 MED ORDER — CITALOPRAM HYDROBROMIDE 20 MG PO TABS
20 MG | Freq: Every day | ORAL | Status: AC
Start: 2021-10-02 — End: 2021-10-05
  Administered 2021-10-02 – 2021-10-05 (×4): 40 mg via ORAL

## 2021-10-02 MED ORDER — POTASSIUM CHLORIDE ER 10 MEQ PO TBCR
10 MEQ | Freq: Every day | ORAL | Status: AC
Start: 2021-10-02 — End: 2021-10-05
  Administered 2021-10-03 – 2021-10-05 (×3): 20 meq via ORAL

## 2021-10-02 MED ORDER — TRAZODONE HCL 100 MG PO TABS
100 MG | Freq: Every evening | ORAL | Status: AC
Start: 2021-10-02 — End: 2021-10-05
  Administered 2021-10-02 – 2021-10-05 (×4): 100 mg via ORAL

## 2021-10-02 MED ORDER — GABAPENTIN 100 MG PO CAPS
100 MG | Freq: Three times a day (TID) | ORAL | Status: AC
Start: 2021-10-02 — End: 2021-10-05
  Administered 2021-10-02 – 2021-10-05 (×11): 100 mg via ORAL

## 2021-10-02 MED ORDER — DEXTROSE 10 % IV SOLN
10 % | INTRAVENOUS | Status: AC | PRN
Start: 2021-10-02 — End: 2021-10-05

## 2021-10-02 MED ORDER — POTASSIUM CHLORIDE ER 10 MEQ PO TBCR
10 MEQ | Freq: Every day | ORAL | Status: DC
Start: 2021-10-02 — End: 2021-10-02

## 2021-10-02 MED ORDER — DICLOFENAC SODIUM 1 % EX GEL
1 % | Freq: Two times a day (BID) | CUTANEOUS | Status: DC
Start: 2021-10-02 — End: 2021-10-05
  Administered 2021-10-02 – 2021-10-05 (×5): 2 g via TOPICAL

## 2021-10-02 MED FILL — GLIPIZIDE 5 MG PO TABS: 5 MG | ORAL | Qty: 1

## 2021-10-02 MED FILL — SUCRALFATE 1 G PO TABS: 1 GM | ORAL | Qty: 1

## 2021-10-02 MED FILL — CITALOPRAM HYDROBROMIDE 20 MG PO TABS: 20 MG | ORAL | Qty: 2

## 2021-10-02 MED FILL — GABAPENTIN 100 MG PO CAPS: 100 MG | ORAL | Qty: 1

## 2021-10-02 MED FILL — MIDODRINE HCL 5 MG PO TABS: 5 MG | ORAL | Qty: 2

## 2021-10-02 MED FILL — MEXILETINE HCL 150 MG PO CAPS: 150 MG | ORAL | Qty: 1

## 2021-10-02 MED FILL — LEVOTHYROXINE SODIUM 75 MCG PO TABS: 75 MCG | ORAL | Qty: 2

## 2021-10-02 MED FILL — MAGNESIUM SULFATE IN D5W 1-5 GM/100ML-% IV SOLN: 1-5 GM/100ML-% | INTRAVENOUS | Qty: 100

## 2021-10-02 MED FILL — FLUTICASONE PROPIONATE 50 MCG/ACT NA SUSP: 50 MCG/ACT | NASAL | Qty: 16

## 2021-10-02 MED FILL — RISAQUAD PO CAPS: ORAL | Qty: 1

## 2021-10-02 MED FILL — ENOXAPARIN SODIUM 30 MG/0.3ML IJ SOSY: 30 MG/0.3ML | INTRAMUSCULAR | Qty: 0.3

## 2021-10-02 MED FILL — BD POSIFLUSH 0.9 % IV SOLN: 0.9 % | INTRAVENOUS | Qty: 40

## 2021-10-02 MED FILL — TRAMADOL HCL 50 MG PO TABS: 50 MG | ORAL | Qty: 1

## 2021-10-02 MED FILL — PANTOPRAZOLE SODIUM 40 MG PO TBEC: 40 MG | ORAL | Qty: 1

## 2021-10-02 MED FILL — VOLTAREN ARTHRITIS PAIN 1 % EX GEL: 1 % | CUTANEOUS | Qty: 100

## 2021-10-02 MED FILL — DEXTROSE 10 % IV SOLN: 10 % | INTRAVENOUS | Qty: 500

## 2021-10-02 MED FILL — HYDROMORPHONE HCL 1 MG/ML IJ SOLN: 1 MG/ML | INTRAMUSCULAR | Qty: 0.5

## 2021-10-02 MED FILL — AMIODARONE HCL 200 MG PO TABS: 200 MG | ORAL | Qty: 1

## 2021-10-02 MED FILL — MONTELUKAST SODIUM 10 MG PO TABS: 10 MG | ORAL | Qty: 1

## 2021-10-02 MED FILL — POTASSIUM CHLORIDE ER 10 MEQ PO TBCR: 10 MEQ | ORAL | Qty: 4

## 2021-10-02 MED FILL — BUMETANIDE 1 MG PO TABS: 1 MG | ORAL | Qty: 1

## 2021-10-02 MED FILL — TRAZODONE HCL 100 MG PO TABS: 100 MG | ORAL | Qty: 1

## 2021-10-02 NOTE — Care Coordination-Inpatient (Signed)
Care Management Assessment      Reason for Admission:   Hypomagnesemia [E83.42]  Recurrent falls [R29.6]  Acute exacerbation of chronic low back pain [M54.50, G89.29]  Fall, initial encounter [W19.XXXA]     RUR: Readmission Risk              Risk of Unplanned Readmission:  33         Advance Directive: Full Code     [x]  No AD on file.     Healthcare Decision Maker:   Janiene, Aarons      Assessment:     10/02/21 1501   Service Assessment   Patient Orientation Alert and Oriented   Cognition Alert   History Provided By Patient   Primary Caregiver Self   Support Systems Family Members   Patient's Healthcare Decision Maker is: 12/02/21 Next of Kin  (son, Armed forces operational officer)   PCP Verified by CM Yes  Molli Knock, APRN or Dierdre Forth)   Last Visit to PCP Within last 3 months   Prior Functional Level Independent in ADLs/IADLs   Current Functional Level Independent in ADLs/IADLs   Can patient return to prior living arrangement Yes   Ability to make needs known: Good   Family able to assist with home care needs: Yes   Would you like for me to discuss the discharge plan with any other family members/significant others, and if so, who? No   Financial Resources Medicare  [facility] AARP medicare advantage)   Social/Functional History   Lives With Family  (lives with son and DIL)   Type of Home Trailer   Home Layout One level   Home Access Stairs to enter with rails   Entrance Stairs - Number of Steps 5 steps   Bathroom Shower/Tub Tub/Shower unit;Shower chair without back   [facility] Help From Family   ADL Assistance Independent   Homemaking Assistance Independent   Ambulation Assistance Independent   Transfer Assistance Independent   Active Driver Yes   Mode of Printmaker   Occupation Retired   Engineer, water   Type of Residence Building control surveyor   Living Arrangements Family Members   Current Services Prior To Admission C-pap;Oxygen Therapy;Durable  Medical Equipment   Current DME Prior to [employer]   DME Ordered? No   Type of Home Care Services None   One/Two Story Residence One story   History of falls? 1  (4 falls in the last 2 weeks d/t dizziness-PT/OT eval ordered)   Services At/After Discharge   Confirm Follow Up Transport Family   Condition of Participation: Discharge Planning   Freedom of Choice list was provided with basic dialogue that supports the patient's individualized plan of care/goals, treatment preferences, and shares the quality data associated with the providers?  Yes       Insurer:   Active Insurance as of 10/01/2021       Primary Coverage       Payor Plan Insurance Group Employer/Plan Group    [facility] MEDICARE [facility] [place] MEDICARE ADVANTAGE [phone]       Payor Plan Address Payor Plan Phone Number Payor Plan Fax Number Effective Dates    [phone]  02/28/2021 - None Entered    [place] 04/28/2021 [place]         Subscriber Name Subscriber Birth Date Member ID       [facility] 10/24/56 01/25/1956  PCP: [name], APRN - NP   Address: [address] / [place] [address]   Phone number: [phone]   Current patient: [x] Yes [] No   Approximate date of last visit:08/2021     DC Transport: son will transport home at discharge.    Transition of care plan:    [x] Unable to determine at this time. Awaiting clinical progress, and disposition recommendations.    Patient pending PT/OT evaluation.   Patient used [facility] when discharge 05/2021. She would like to use them again if needed.    Patient has DME- C-PAP, glucometer, cane, RW, wheelchair, oxygen concentrator and travel tank (she is unsure of her oxygen company name).     , RN, [facility]  Care management

## 2021-10-02 NOTE — Plan of Care (Signed)
Problem: Occupational Therapy - Adult  Goal: By Discharge: Performs self-care activities at highest level of function for planned discharge setting.  See evaluation for individualized goals.  Description: FUNCTIONAL STATUS PRIOR TO ADMISSION:  Per pt report, pt is primarily MI for self care (tub transfer bench) and functional transfers/mobility with [facility] however when unsteady DIL assists as needed.     HOME SUPPORT: Patient lived with son, DIL and granddaughter with family to assist as needed.    Occupational Therapy Goals:  Initiated 10/02/2021  1.  Patient will perform grooming with Modified Independence within 7 day(s).  2.  Patient will perform upper body dressing with Modified Independence within 7 day(s).  3.  Patient will perform lower body dressing with Modified Independence within 7 day(s).  4.  Patient will perform toilet transfers with Modified Independence  within 7 day(s).  5.  Patient will perform all aspects of toileting with Modified Independence within 7 day(s).  6.  Patient will participate in upper extremity therapeutic exercise/activities with Modified Independence for 10 minutes within 7 day(s).    7.  Patient will utilize energy conservation techniques during functional activities with verbal cues within 7 day(s).    Outcome: Progressing    OCCUPATIONAL THERAPY EVALUATION    Patient: Deborah [name] (66 y.o. female)  Date: 10/02/2021  Primary Diagnosis: Hypomagnesemia [E83.42]  Recurrent falls [R29.6]  Acute exacerbation of chronic low back pain [M54.50, G89.29]  Fall, initial encounter [W19.XXXA]         Precautions:                    ASSESSMENT :  Patient received semi supine in bed A&OX4 and agreeable for OT eval/tx. Per pt report, pt lives with son, DIL and granddaughter in mobile home with aprox 4-5 steps right rail to enter and is MI for self care (tub transfer bench) and functional transfers/mobility with SPC. Per pt report, pt has had increased falls in past 6 months with 4 falls in past 2  weeks and reporting dizziness when reaching overhead while standing and when reaching down for LB dressing.     Patient presents with decreased activity tolerance (dizzy with sitting EOB), generalized weakness and increased need for assist with self care (SBA facial hygiene simulated EOB, SBA UB dressing simulated EOB, max A LB Dressing simulated EOB) and functional transfers/mobility (SBA sup<->sit and scooting EOB with further transfers held 2/2 pt dizzy and transport present to take pt for CT in bed). Patient would benefit from continued skilled OT services while at [facility] In order to increase safety and independence with self care and functional transfers/mobility. Recommend discharge to SNF when medically appropriate.     Functional Outcome Measure:  The patient scored 18/24 on the [facility] AM-PAC       PLAN :  Recommendations and Planned Interventions:   self care training, therapeutic activities, functional mobility training, balance training, therapeutic exercise, endurance activities, patient education, and home safety training    Frequency/Duration: OT Plan of Care: 5 times/week    Recommendation for discharge: (in order for the patient to meet his/her long term goals): Continue to assess pending progress    Other factors to consider for discharge: time since onset, severity of deficits, PLOF    IF patient discharges home will need the following DME: TBD (may benefit from LB Dressing aids like reacher, sock aid, shoe horn)       SUBJECTIVE:   Patient stated, "I am dizzy." at EOB  OBJECTIVE DATA SUMMARY:     Past Medical History:   Diagnosis Date    (HFpEF) heart failure with preserved ejection fraction (HCC)     Anxiety and depression     Aortic valve replaced     S/p bovine aortic valve replacement.    Asthma     Chronic narcotic use     Chronic obstructive pulmonary disease (HCC)     Chronic pain     CKD (chronic kidney disease), stage III (HCC)     Baseline creatinine is 1.3-1.4 with GFR in the  40s.    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)     GERD (gastroesophageal reflux disease)     History of vascular access device 04/13/2021    4 FR Single PICC for LTABX: R cephalic vessell length 48 CM Max P leave @ 1 CM out; Arm circumferenc 40 CM    Hyperlipidemia     Hypothyroidism     Morbid obesity (HCC)     Neuropathy     Obstructive sleep apnea     Rhinitis      Past Surgical History:   Procedure Laterality Date    AORTIC VALVE REPLACEMENT      Bovine bioprosthetic    CARDIAC PROCEDURE N/A 08/30/2021    Intracardiac echocardiogram performed by Thurston Pounds, MD at [facility] CARDIAC CATH LAB    COLONOSCOPY N/A 12/01/2020    COLONOSCOPY performed by Ian Malkin, MD at [facility] ENDOSCOPY    EP DEVICE PROCEDURE N/A 08/30/2021    Ablation PVC performed by Thurston Pounds, MD at [facility] CARDIAC CATH LAB    EP DEVICE PROCEDURE N/A 08/30/2021    Drug stimulation performed by Thurston Pounds, MD at [facility] CARDIAC CATH LAB    EP DEVICE PROCEDURE N/A 08/30/2021    Transeptal puncture performed by Thurston Pounds, MD at [facility] CARDIAC CATH LAB    EP DEVICE PROCEDURE N/A 08/30/2021    Ep 3d mapping performed by Thurston Pounds, MD at [facility] CARDIAC CATH LAB    INVASIVE VASCULAR N/A 08/30/2021    Ultrasound guided vascular access performed by Thurston Pounds, MD at [facility] CARDIAC CATH LAB    PACEMAKER        Expanded or extensive additional review of patient history:   Lives With:  (son and daughter in law)  Type of Home: Mobile home  Home Layout: One level (4-5 steps to enter right rail to enter)      Bathroom Shower/Tub: Tub/Shower unit  [employer]: Standard  Bathroom Equipment: Tub transfer bench     Home Equipment: Gilmer Mor, Product manager, Environmental consultant, rolling  Has the patient had two or more falls in the past year or any fall with injury in the past year?: Yes    Hand Dominance: right     EXAMINATION OF PERFORMANCE DEFICITS:    Cognitive/Behavioral Status:  Orientation  Overall Orientation Status: Within Normal Limits  Orientation Level: Oriented X4  Cognition  Overall Cognitive  Status: WNL    Range of Motion:   AROM: Generally decreased, functional  PROM: Generally decreased, functional    Strength:  Strength: Generally decreased, functional (right shoulder grossly 2/5 (pt stating baseline) distally 4+/5, LUE 4+/5)    Coordination:  Coordination: Within functional limits     Tone & Sensation:   Tone: Normal  Sensation: Intact    Functional Mobility and Transfers for ADLs:    Bed Mobility:     Bed Mobility Training  Bed Mobility Training: Yes  Supine to Sit:  Stand-by assistance  Sit to Supine: Stand-by assistance  Scooting: Stand-by assistance    Balance:   Intact sitting balance EOB     ADL Assessment:     Grooming: Stand by assistance (simulated EOB)     UE Dressing: Stand by assistance (simulated EOB)     LE Dressing: Maximum assistance (simulated EOB)     ADL Intervention and task modifications:      [employer] AM-PACTM "6 Clicks"                                                       Daily Activity Inpatient Short Form  How much help from another person does the patient currently need... Total; A Lot A Little None   1.  Putting on and taking off regular lower body clothing? []   1 [x]   2 []   3 []   4   2.  Bathing (including washing, rinsing, drying)? []   1 []   2 [x]   3 []   4   3.  Toileting, which includes using toilet, bedpan or urinal? []  1 []   2 [x]   3 []   4   4.  Putting on and taking off regular upper body clothing? []   1 []   2 [x]   3 []   4   5.  Taking care of personal grooming such as brushing teeth? []   1 []   2 [x]   3 []   4   6.  Eating meals? []   1 []   2 []   3 [x]   4    2007, Trustees of , under license to [employer] [place]. All rights reserved     Score: 18/24     Interpretation of Tool:  Represents clinically-significant functional categories (i.e. Activities of daily living).    Cutoff score 39.4 (19) correlates to a good likelihood of discharging home versus a facility  Diane U. , , , . Passek, .  , AM-PAC "6-Clicks" Functional Assessment Scores Predict Acute Care Hospital Discharge Destination, Physical Therapy, Volume 94, Issue 9, 28 September 2012, Pages [phone],    Pain Rating:  4/10 back pain   Pain Intervention(s):   patient medicated for pain prior to session    Activity Tolerance:   Fair     After treatment:   Patient left in no apparent distress in bed, Call bell within reach, and transport present    COMMUNICATION/EDUCATION:   The patient's plan of care was discussed with: physical therapist and registered nurse    Patient Education  Education Given To: Patient  Education Provided: Role of Therapy;Plan of Care;Home Exercise Program;ADL Adaptive Strategies;Transfer Training;Energy Conservation;Fall Prevention Strategies;Equipment  Education Method: Demonstration;Verbal  Barriers to Learning: None  Education Outcome: Verbalized understanding;Demonstrated understanding;Continued education needed    Thank you for this referral.  , OT  Minutes: 23    Occupational Therapy Evaluation Charge Determination   History Examination Decision-Making   LOW Complexity : Brief history review  MEDIUM Complexity: 3-5 Performance deficits relating to physical, cognitive, or psychosocial skills that result in activity limitations and/or participation restrictions MEDIUM Complexity: Patient may present with comorbidities that affect occupational performance. Minimal to moderate modifications of tasks or assist (eg. physical or verbal) with assist is necessary to enable pt to complete eval   Based  on the above components, the patient evaluation is determined to be of the following complexity level: Low

## 2021-10-02 NOTE — Progress Notes (Cosign Needed)
ORTHOPAEDIC SURGERY CONSULT NOTE    Subjective:     Date of Consultation:  October 02, 2021      Deborah [name] is a 66 y.o. female who is being seen for acute on chronic lumbar pain.  She has a past medical history of HFpEF, bioprosthetic AVR, hypotension on midodrine, lumbar spinal stenosis, s/p pacemaker, DM 2, OSA on cpap and 2L O2.  She presented to the ER due to dizziness and recurrent falls.  She reports that the last fall was face first and caused increased lumbar pain.  She denied bowel or bladder incontinence.  .She had a PVC ablation 08/30/21 by Dr. Loma Newton and been having dizziness and weakness since.  No LOC, HA, n/v, unsteady gait, vision change.  Has fallen 4x in past 2 weeks due to dizziness.       Patient Active Problem List    Diagnosis Date Noted    Recurrent falls 10/01/2021    S/P ablation of ventricular arrhythmia 08/30/2021    Anasarca 06/21/2021    Acute on chronic heart failure with preserved ejection fraction (HFpEF) (HCC) 05/09/2021    Chronic respiratory failure with hypoxia (HCC) 05/09/2021    BMI 50.0-59.9, adult (HCC) 05/09/2021    Hypokalemia due to loss of potassium 05/09/2021    Acute on chronic respiratory failure with hypoxia and hypercapnia (HCC) 05/09/2021    OSA (obstructive sleep apnea) 05/09/2021    COPD (chronic obstructive pulmonary disease) (HCC) 05/09/2021    History of PSVT (paroxysmal supraventricular tachycardia) 05/09/2021    Anemia 05/09/2021    Hypomagnesemia 05/09/2021    Hypothyroidism     DM (diabetes mellitus), type 2 with complications (HCC)     Systemic inflammatory response syndrome (SIRS) (HCC) 04/08/2021    PVC (premature ventricular contraction) 03/06/2021    Acute on chronic diastolic (congestive) heart failure (HCC) 01/24/2021    CHF (congestive heart failure) (HCC) 11/27/2020    Elevated bilirubin 11/27/2020    Liver cirrhosis (HCC) 11/27/2020    Colitis 11/27/2020    Thrombocytopenia (HCC) 04/30/2020    Morbid obesity (HCC) 04/30/2020    CKD (chronic kidney  disease), stage III (HCC) 04/30/2020    Aortic valve replaced     Pacemaker     Asthma     Anxiety and depression     Chronic narcotic use     Chronic pain     Rhinitis     Hyperlipidemia     Neuropathy     GERD (gastroesophageal reflux disease)     Arm paresthesia, left 04/29/2020     Family History   Problem Relation Age of Onset    Hypertension Mother     Hypertension Father       Social History     Tobacco Use    Smoking status: Former     Packs/day: 1.00     Types: Cigarettes     Quit date: 03/21/2010     Years since quitting: 11.5    Smokeless tobacco: Never   Substance Use Topics    Alcohol use: Not Currently     Past Medical History:   Diagnosis Date    (HFpEF) heart failure with preserved ejection fraction (HCC)     Anxiety and depression     Aortic valve replaced     S/p bovine aortic valve replacement.    Asthma     Chronic narcotic use     Chronic obstructive pulmonary disease (HCC)     Chronic pain  CKD (chronic kidney disease), stage III (HCC)     Baseline creatinine is 1.3-1.4 with GFR in the 40s.    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)     GERD (gastroesophageal reflux disease)     History of vascular access device 04/13/2021    4 FR Single PICC for LTABX: R cephalic vessell length 48 CM Max P leave @ 1 CM out; Arm circumferenc 40 CM    Hyperlipidemia     Hypothyroidism     Morbid obesity (HCC)     Neuropathy     Obstructive sleep apnea     Rhinitis       Past Surgical History:   Procedure Laterality Date    AORTIC VALVE REPLACEMENT      Bovine bioprosthetic    CARDIAC PROCEDURE N/A 08/30/2021    Intracardiac echocardiogram performed by Thurston Pounds, MD at [facility] CARDIAC CATH LAB    COLONOSCOPY N/A 12/01/2020    COLONOSCOPY performed by Ian Malkin, MD at [facility] ENDOSCOPY    EP DEVICE PROCEDURE N/A 08/30/2021    Ablation PVC performed by Thurston Pounds, MD at [facility] CARDIAC CATH LAB    EP DEVICE PROCEDURE N/A 08/30/2021    Drug stimulation performed by Thurston Pounds, MD at [facility] CARDIAC CATH LAB    EP DEVICE PROCEDURE  N/A 08/30/2021    Transeptal puncture performed by Thurston Pounds, MD at [facility] CARDIAC CATH LAB    EP DEVICE PROCEDURE N/A 08/30/2021    Ep 3d mapping performed by Thurston Pounds, MD at [facility] CARDIAC CATH LAB    INVASIVE VASCULAR N/A 08/30/2021    Ultrasound guided vascular access performed by Thurston Pounds, MD at [facility] CARDIAC CATH LAB    PACEMAKER        Prior to Admission medications    Medication Sig Start Date End Date Taking? Authorizing Provider   amiodarone (CORDARONE) 200 MG tablet Take 1 tablet by mouth daily   Yes Historical Provider, MD   bumetanide (BUMEX) 1 MG tablet Take 1 tablet by mouth four times a week 1mg  daily on Tu, Th, Sat, Sun   Yes Historical Provider, MD   glipiZIDE (GLUCOTROL XL) 5 MG extended release tablet Take 1 tablet by mouth daily   Yes Historical Provider, MD   omeprazole (PRILOSEC) 40 MG delayed release capsule Take 1 capsule by mouth daily 09/17/21   Historical Provider, MD   traMADol (ULTRAM) 50 MG tablet Take 1 tablet by mouth as needed. 09/17/21   Historical Provider, MD   mexiletine (MEXITIL) 150 MG capsule Take 1 capsule by mouth 3 times daily 08/27/21   Jillyn Ledger, APRN - NP   diclofenac (VOLTAREN) 50 MG EC tablet Take 1 tablet by mouth daily  Patient not taking: Reported on 09/27/2021 06/19/21   Historical Provider, MD   gabapentin (NEURONTIN) 100 MG capsule Take 1 capsule by mouth 3 times daily for 60 days. Max Daily Amount: 300 mg 06/28/21 10/02/21  Dayna Barker, MD   Probiotic Product (ACIDOPHILUS PROBIOTIC) CAPS capsule Take 1 capsule by mouth daily 06/29/21   Twanna Hy Do, MD   bumetanide (BUMEX) 1 MG tablet Take 1 tablet by mouth 2 times daily  Patient taking differently: Take 1 tablet by mouth three times a week Taking twice a day on MWF 06/28/21   Khoi B Do, MD   potassium chloride (KLOR-CON M) 20 MEQ extended release tablet Take 1 tablet by mouth daily 06/28/21   Dayna Barker, MD   midodrine (PROAMATINE)  10 MG tablet Take 1 tablet by mouth 3 times daily 06/28/21   Dayna Barker, MD   albuterol sulfate HFA  (PROVENTIL;VENTOLIN;PROAIR) 108 (90 Base) MCG/ACT inhaler Inhale 2 puffs into the lungs every 6 hours as needed    Ar Automatic Reconciliation   aspirin 81 MG chewable tablet Take 1 tablet by mouth daily 12/03/20   Ar Automatic Reconciliation   atorvastatin (LIPITOR) 10 MG tablet Take 1 tablet by mouth daily    Ar Automatic Reconciliation   citalopram (CELEXA) 40 MG tablet Take 1 tablet by mouth daily 02/22/20   Ar Automatic Reconciliation   fluticasone (FLONASE) 50 MCG/ACT nasal spray 2 sprays by Nasal route daily as needed    Ar Automatic Reconciliation   levothyroxine (SYNTHROID) 150 MCG tablet Take 1 tablet by mouth every morning (before breakfast) 01/27/21   Ar Automatic Reconciliation   miconazole (MICOTIN) 2 % powder Apply topically as needed 04/24/21   Ar Automatic Reconciliation   montelukast (SINGULAIR) 10 MG tablet Take 1 tablet by mouth nightly    Ar Automatic Reconciliation   polyethylene glycol (GLYCOLAX) 17 GM/SCOOP powder Take 17 g by mouth daily as needed 04/24/21   Ar Automatic Reconciliation   sucralfate (CARAFATE) 1 GM tablet Take 1 tablet by mouth 3 times daily    Ar Automatic Reconciliation   traZODone (DESYREL) 100 MG tablet Take 1 tablet by mouth nightly 12/31/19   Ar Automatic Reconciliation     Current Facility-Administered Medications   Medication Dose Route Frequency    amiodarone (CORDARONE) tablet 200 mg  200 mg Oral Daily    atorvastatin (LIPITOR) tablet 10 mg  10 mg Oral Daily    [START ON 10/03/2021] bumetanide (BUMEX) tablet 1 mg  1 mg Oral Once per day on Mon Wed Fri    bumetanide (BUMEX) tablet 1 mg  1 mg Oral Once per day on Sun Tue Thu Sat    citalopram (CELEXA) tablet 40 mg  40 mg Oral Daily    fluticasone (FLONASE) 50 MCG/ACT nasal spray 2 spray  2 spray Nasal Daily PRN    gabapentin (NEURONTIN) capsule 100 mg  100 mg Oral TID    levothyroxine (SYNTHROID) tablet 150 mcg  150 mcg Oral QAM AC    [Held by provider] mexiletine (MEXITIL) capsule 150 mg  150 mg Oral TID    miconazole  (MICOTIN) 2 % powder   Topical PRN    midodrine (PROAMATINE) tablet 10 mg  10 mg Oral TID    montelukast (SINGULAIR) tablet 10 mg  10 mg Oral Nightly    pantoprazole (PROTONIX) tablet 40 mg  40 mg Oral QAM AC    polyethylene glycol (GLYCOLAX) packet 17 g  17 g Oral Daily PRN    acidophilus probiotic capsule 1 capsule  1 capsule Oral Daily    sucralfate (CARAFATE) tablet 1 g  1 g Oral TID    traMADol (ULTRAM) tablet 50 mg  50 mg Oral Q6H PRN    traZODone (DESYREL) tablet 100 mg  100 mg Oral Nightly    glucose chewable tablet 16 g  4 tablet Oral PRN    dextrose bolus 10% 125 mL  125 mL IntraVENous PRN    Or    dextrose bolus 10% 250 mL  250 mL IntraVENous PRN    glucagon injection 1 mg  1 mg SubCUTAneous PRN    dextrose 10 % infusion   IntraVENous Continuous PRN    [START ON 10/03/2021] bumetanide (BUMEX) tablet 1 mg  1 mg Oral Once per day on Mon Wed Fri    [START ON 10/03/2021] potassium chloride (KLOR-CON) extended release tablet 20 mEq  20 mEq Oral Daily    HYDROmorphone (DILAUDID) injection 0.5 mg  0.5 mg IntraVENous Q4H PRN    insulin lispro (HUMALOG) injection vial 0-8 Units  0-8 Units SubCUTAneous TID WC    insulin lispro (HUMALOG) injection vial 0-4 Units  0-4 Units SubCUTAneous Nightly    glipiZIDE (GLUCOTROL) tablet 5 mg  5 mg Oral QAM AC    sodium chloride flush 0.9 % injection 5-40 mL  5-40 mL IntraVENous 2 times per day    sodium chloride flush 0.9 % injection 5-40 mL  5-40 mL IntraVENous PRN    0.9 % sodium chloride infusion   IntraVENous PRN    enoxaparin Sodium (LOVENOX) injection 30 mg  30 mg SubCUTAneous BID    ondansetron (ZOFRAN-ODT) disintegrating tablet 4 mg  4 mg Oral Q8H PRN    Or    ondansetron (ZOFRAN) injection 4 mg  4 mg IntraVENous Q6H PRN    acetaminophen (TYLENOL) tablet 650 mg  650 mg Oral Q6H PRN    Or    acetaminophen (TYLENOL) suppository 650 mg  650 mg Rectal Q6H PRN      Allergies   Allergen Reactions    Nitroglycerin      Other reaction(s): Unknown (comments)  hypotension     Hydrochlorothiazide Other (See Comments)     Reports 'kidneys dry up"     Tetanus Toxoids Swelling    Aloe Vera Rash        Review of Systems:  A comprehensive review of systems was negative except for that written in the HPI.    Mental Status: no dementia    Objective:     Patient Vitals for the past 8 hrs:   BP Temp Temp src Pulse Resp SpO2   10/02/21 1215 (!) 96/46 99.3 F (37.4 C) Oral 70 16 92 %   10/02/21 1109 (!) 105/54 -- -- 70 -- --   10/02/21 1104 (!) 118/56 -- -- 69 -- --   10/02/21 1100 (!) 74/54 -- -- 69 -- --   10/02/21 1035 -- -- -- -- 16 --   10/02/21 0805 (!) 102/53 98.1 F (36.7 C) Oral 73 19 91 %   10/02/21 0700 -- -- -- 71 -- --     Temp (24hrs), Avg:98.7 F (37.1 C), Min:98.1 F (36.7 C), Max:99.3 F (37.4 C)      Gen: Well-developed,  in no acute distress   Lymph: No cervical or inguinal adenopathy   Musc:LLE:   PF/DF: 4/5  EHL/EFL: 4/5  KF: 4/5  KE: 4/5  HF: 3/5- poor effort  2+knee jerk  Negative ankle clonus.    Negative Babinskis  +DP and PT pulses  RLE:   PF/DF: 4/5  EHL/EFL: 4/5  KF: 4/5  KE: 4/5  HF: 3/5- poor effort  2+knee jerk  Negative ankle clonus.    Negative Babinskis  +DP and PT pulses  LUE:  Grip strength 5/5  WF: 5/5  WE: 5/5  Bicep: 5/5  Tricep: 5/5  Negative Hoffmans  Negative Spurling  +radial and ulnar pulse.   RUE:  Grip strength 5/5  WF: 5/5  WE: 5/5  Bicep: 5/5  Tricep: 5/5  Negative Hoffmans  Negative Spurling  +radial and ulnar pulse.   Musc: Pain to palpation of thoracolumbar spine with paraspinal spasm BL.  Able to move BL LE without difficulty.  SILT BL LE.  +DP and PT pulses BL LE.    Skin: No skin breakdown noted. Skin warm, pink, dry  Psych: Good insight, oriented to person, place and time, alert      Cervical kyphosis and degenerative mild subluxations are unchanged. There is no  fracture or compression deformity. The odontoid process is intact. The  craniocervical junction is within normal limits. Multilevel disc degeneration is  unchanged. Prominent  osteophytes are unchanged.     Carotid bifurcation calcific atherosclerosis is unchanged.     C2-C3: No herniation or stenosis.     C3-C4: Left facet arthrosis. Severe left foraminal stenosis.     C4-C5: Asymmetric left posterior disc osteophyte complex. Facet arthrosis.  Moderate central spinal canal stenosis. Moderate left foraminal stenosis.     C5-C6: Asymmetric right posterior disc osteophyte complex. Moderate central  spinal canal stenosis. Moderate right foraminal stenosis.     C6-C7: Asymmetric right posterior discussed by complex. Severe central spinal  canal stenosis. Severe right and moderate left foraminal stenosis.     C7-T1: Mild right foraminal stenosis.     No significant change since recent CT angiography given difference in technique.     IMPRESSION:     1. C6-C7 severe central spinal canal and right foraminal stenosis.  2. Moderate stenoses at C4-5 and C5-6.  3. C3-C4 severe left foraminal stenosis.  4. No fracture.    INDICATION: Low back pain after fall     COMPARISON: CT 08/19/2021.     CONTRAST:  None.     TECHNIQUE: Multislice helical CT of the lumbar spine was performed without  intravenous contrast administration.  Coronal and sagittal reformats were  generated.  CT dose reduction was achieved through use of a standardized  protocol tailored for this examination and automatic exposure control for dose  modulation.      FINDINGS:  There is no acute fracture or subluxation. Vertebral body heights are  maintained. There are multilevel degenerative disc and facet changes  superimposed on a developmentally narrowed spinal canal. There is multilevel  spinal canal and bilateral neural foraminal stenosis most prominent at L1-2  where there is severe spinal canal stenosis with moderate to severe left and  severe right neural foraminal stenosis. There is no abnormality in alignment.  The paraspinal soft tissues are unremarkable. An IVC filter is present.     IMPRESSION:  No acute abnormality.  Multilevel degenerative changes most prominent at L1-2  where there is severe spinal canal stenosis with moderate to severe left and  severe right neural foraminal stenosis.  Labs:   Recent Results (from the past 24 hour(s))   EKG 12 Lead    Collection Time: 10/01/21  6:37 PM   Result Value Ref Range    Ventricular Rate 71 BPM    Atrial Rate 71 BPM    P-R Interval 216 ms    QRS Duration 110 ms    Q-T Interval 386 ms    QTc Calculation (Bazett) 419 ms    P Axis 31 degrees    R Axis -54 degrees    T Axis 56 degrees    Diagnosis       Sinus rhythm with 1st degree AV block with occasional ventricular-paced   complexes  Possible Left atrial enlargement  Left anterior fascicular block  Left ventricular hypertrophy ( R in aVL , Cornell product )  Abnormal ECG  When compared with ECG of 04-Jul-2021 14:51,  Vent. rate has increased BY   2  BPM  Confirmed by Rinaldo Ratel [id number]) on 10/02/2021 9:14:11 AM     CBC with Auto Differential    Collection Time: 10/01/21  7:20 PM   Result Value Ref Range    WBC 6.0 3.6 - 11.0 K/uL    RBC 3.61 (L) 3.80 - 5.20 M/uL    Hemoglobin 9.6 (L) 11.5 - 16.0 g/dL    Hematocrit 40.9 (L) 35.0 - 47.0 %    MCV 88.1 80.0 - 99.0 FL    MCH 26.6 26.0 - 34.0 PG    MCHC 30.2 30.0 - 36.5 g/dL    RDW 81.1 91.4 - 78.2 %    Platelets 121 (L) 150 - 400 K/uL    MPV 11.4 8.9 - 12.9 FL    Nucleated RBCs 0.0 0 PER 100 WBC    nRBC 0.00 0.00 - 0.01 K/uL    Neutrophils % 60 32 - 75 %    Lymphocytes % 29 12 - 49 %    Monocytes % 8 5 - 13 %    Eosinophils % 3 0 - 7 %    Basophils % 1 0 - 1 %    Immature Granulocytes 0 0.0 - 0.5 %    Neutrophils Absolute 3.6 1.8 - 8.0 K/UL    Lymphocytes Absolute 1.8 0.8 - 3.5 K/UL    Monocytes Absolute 0.5 0.0 - 1.0 K/UL    Eosinophils Absolute 0.2 0.0 - 0.4 K/UL    Basophils Absolute 0.0 0.0 - 0.1 K/UL    Absolute Immature Granulocyte 0.0 0.00 - 0.04 K/UL    Differential Type AUTOMATED     CMP    Collection Time: 10/01/21  7:20 PM   Result Value Ref Range    Sodium 140 136 - 145 mmol/L     Potassium 3.6 3.5 - 5.1 mmol/L    Chloride 103 97 - 108 mmol/L    CO2 34 (H) 21 - 32 mmol/L    Anion Gap 3 (L) 5 - 15 mmol/L    Glucose 111 (H) 65 - 100 mg/dL    BUN 17 6 - 20 MG/DL    Creatinine 9.56 (H) 0.55 - 1.02 MG/DL    Bun/Cre Ratio 13 12 - 20      Est, Glom Filt Rate 45 (L) >60 ml/min/1.73m2    Calcium 8.7 8.5 - 10.1 MG/DL    Total Bilirubin 1.6 (H) 0.2 - 1.0 MG/DL    ALT 20 12 - 78 U/L    AST 17 15 - 37 U/L    Alk Phosphatase 92 45 - 117 U/L    Total Protein 7.2 6.4 - 8.2 g/dL    Albumin 3.3 (L) 3.5 - 5.0 g/dL    Globulin 3.9 2.0 - 4.0 g/dL    Albumin/Globulin Ratio 0.8 (L) 1.1 - 2.2     Magnesium    Collection Time: 10/01/21  7:20 PM   Result Value Ref Range    Magnesium 1.4 (L) 1.6 - 2.4 mg/dL   Extra Tubes Hold    Collection Time: 10/01/21  7:20 PM   Result Value Ref Range    Specimen HOld SST,BLUE,RED,DK GRN     Comment:        Add-on orders for these samples will be processed based on acceptable specimen integrity and analyte stability, which may vary by analyte.   Basic Metabolic Panel w/ Reflex to MG    Collection Time: 10/02/21  2:09 AM   Result Value Ref Range    Sodium 139  136 - 145 mmol/L    Potassium 3.3 (L) 3.5 - 5.1 mmol/L    Chloride 103 97 - 108 mmol/L    CO2 32 21 - 32 mmol/L    Anion Gap 4 (L) 5 - 15 mmol/L    Glucose 159 (H) 65 - 100 mg/dL    BUN 16 6 - 20 MG/DL    Creatinine 6.64 (H) 0.55 - 1.02 MG/DL    Bun/Cre Ratio 13 12 - 20      Est, Glom Filt Rate 49 (L) >60 ml/min/1.44m2    Calcium 8.6 8.5 - 10.1 MG/DL   CBC with Auto Differential    Collection Time: 10/02/21  2:09 AM   Result Value Ref Range    WBC 6.1 3.6 - 11.0 K/uL    RBC 3.44 (L) 3.80 - 5.20 M/uL    Hemoglobin 9.2 (L) 11.5 - 16.0 g/dL    Hematocrit 40.3 (L) 35.0 - 47.0 %    MCV 87.5 80.0 - 99.0 FL    MCH 26.7 26.0 - 34.0 PG    MCHC 30.6 30.0 - 36.5 g/dL    RDW 47.4 (H) 25.9 - 14.5 %    Platelets 113 (L) 150 - 400 K/uL    MPV 11.1 8.9 - 12.9 FL    Nucleated RBCs 0.0 0 PER 100 WBC    nRBC 0.00 0.00 - 0.01 K/uL    Neutrophils  % 51 32 - 75 %    Lymphocytes % 38 12 - 49 %    Monocytes % 7 5 - 13 %    Eosinophils % 3 0 - 7 %    Basophils % 1 0 - 1 %    Immature Granulocytes 0 0.0 - 0.5 %    Neutrophils Absolute 3.2 1.8 - 8.0 K/UL    Lymphocytes Absolute 2.3 0.8 - 3.5 K/UL    Monocytes Absolute 0.4 0.0 - 1.0 K/UL    Eosinophils Absolute 0.2 0.0 - 0.4 K/UL    Basophils Absolute 0.0 0.0 - 0.1 K/UL    Absolute Immature Granulocyte 0.0 0.00 - 0.04 K/UL    Differential Type AUTOMATED     Magnesium    Collection Time: 10/02/21  2:09 AM   Result Value Ref Range    Magnesium 1.4 (L) 1.6 - 2.4 mg/dL   POCT Glucose    Collection Time: 10/02/21  7:24 AM   Result Value Ref Range    POC Glucose 158 (H) 65 - 117 mg/dL    Performed by: Tora Perches PCT    POCT Glucose    Collection Time: 10/02/21 12:12 PM   Result Value Ref Range    POC Glucose 147 (H) 65 - 117 mg/dL    Performed by: Quin Hoop (PCT) Float          Impression:     Patient Active Problem List    Diagnosis Date Noted    Recurrent falls 10/01/2021    S/P ablation of ventricular arrhythmia 08/30/2021    Anasarca 06/21/2021    Acute on chronic heart failure with preserved ejection fraction (HFpEF) (HCC) 05/09/2021    Chronic respiratory failure with hypoxia (HCC) 05/09/2021    BMI 50.0-59.9, adult (HCC) 05/09/2021    Hypokalemia due to loss of potassium 05/09/2021    Acute on chronic respiratory failure with hypoxia and hypercapnia (HCC) 05/09/2021    OSA (obstructive sleep apnea) 05/09/2021    COPD (chronic obstructive pulmonary disease) (HCC) 05/09/2021    History of PSVT (paroxysmal supraventricular  tachycardia) 05/09/2021    Anemia 05/09/2021    Hypomagnesemia 05/09/2021    Hypothyroidism     DM (diabetes mellitus), type 2 with complications (HCC)     Systemic inflammatory response syndrome (SIRS) (HCC) 04/08/2021    PVC (premature ventricular contraction) 03/06/2021    Acute on chronic diastolic (congestive) heart failure (HCC) 01/24/2021    CHF (congestive heart failure) (HCC)  11/27/2020    Elevated bilirubin 11/27/2020    Liver cirrhosis (HCC) 11/27/2020    Colitis 11/27/2020    Thrombocytopenia (HCC) 04/30/2020    Morbid obesity (HCC) 04/30/2020    CKD (chronic kidney disease), stage III (HCC) 04/30/2020    Aortic valve replaced     Pacemaker     Asthma     Anxiety and depression     Chronic narcotic use     Chronic pain     Rhinitis     Hyperlipidemia     Neuropathy     GERD (gastroesophageal reflux disease)     Arm paresthesia, left 04/29/2020     Principal Problem:    Recurrent falls  Resolved Problems:    * No resolved hospital problems. *      Plan:   A: 1. Lumbar central stenosis worst at L1-L2  2. C6-C7 central stenosis, severe  3. Lumbago    P: 1. Her examination is far better than I would expect for her imaging results.  She is not interested in surgical intervention for her cervical or lumbar spine.  I would recommend a CT myelogram if she should worsen with regards to her neurological status.  For now, I recommend conservative measures.  I would not recommend steroids for this due to lack of radicular symptoms and concern for significant electrolyte changes. Flexeril 10 mg TID PRN spasm. Consider topical voltaren for her lumbar spine.  Q4 hour neurovascular checks.  If worsening neurological status please page, but she can followup outpatient with Dr. Almira Bar in a few weeks.      Discussed case with Dr. Almira Bar who agrees with plan.      Fredderick Severance, PA   Orthopaedic Surgery PA  [facility]

## 2021-10-02 NOTE — Progress Notes (Addendum)
I was asked to look at her medication list, not a consult by cardiology NP    She is anemic and dizzy with low bp  Need midodrine 5-10 mg po tid  Stop mexiletine    Limited echo for pericardial effusion (pending)  She had right groin hematoma 1 month ago  Search for possible GI site of bleeding is recommended because she had been admitted in 09/13/2021 at [facility] for GI bleed & acute on chronic COPD.  EGD showed gastropathy but no GI bleeding.  She had used Voltaren & Aleve concurrently; discharged with Thrombocytopenia (82) and Hgb 7.  This admission Hb 9

## 2021-10-02 NOTE — Plan of Care (Signed)
Problem: Physical Therapy - Adult  Goal: By Discharge: Performs mobility at highest level of function for planned discharge setting.  See evaluation for individualized goals.  Description: FUNCTIONAL STATUS PRIOR TO ADMISSION: Patient was modified independent using a single point cane for functional mobility.    HOME SUPPORT PRIOR TO ADMISSION: The patient lived with her family but did not require assistance. History of numerous falls preceded by dizziness over the past two weeks.     Physical Therapy Goals  Initiated 10/02/2021  1.  Patient will move from supine to sit and sit to supine, scoot up and down, and roll side to side in bed with modified independence within 7 day(s).    2.  Patient will perform sit to stand with modified independence within 7 day(s).  3.  Patient will transfer from bed to chair and chair to bed with modified independence using the least restrictive device within 7 day(s).  4.  Patient will ambulate with modified independence for 50 feet with the least restrictive device within 7 day(s).       Outcome: Progressing   PHYSICAL THERAPY EVALUATION    Patient: Deborah [name] (66 y.o. female)  Date: 10/02/2021  Primary Diagnosis: Hypomagnesemia [E83.42]  Recurrent falls [R29.6]  Acute exacerbation of chronic low back pain [M54.50, G89.29]  Fall, initial encounter [W19.XXXA]       Precautions: Fall Risk                       ASSESSMENT :   DEFICITS/IMPAIRMENTS:   The patient is limited by decreased functional mobility, strength, activity tolerance, coordination, balance, increased pain levels in the setting of hospital admission for recurrent falls with dizziness. Per chart, patient on 2L NC at baseline for history of COPD, however she is received on room air with spo2 stable during evaluation. 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 Patient with + orthostatic hypotension as below, alerted RN and patient symptomatic in standing for dizziness. Patient with very limited activity  tolerance secondary to low back and bil knee pain. 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 She is unable to tolerate ambulation today secondary to orthostasis and pain. Recommend SNF level rehab upon d/c.     Patient Vitals for the past 6 hrs:   Pulse BP Patient Position   10/02/21 1604 71 (!) 111/53 Supine   10/02/21 1602 73 (!) 92/45 Sitting   10/02/21 1559 89 (!) 89/53 Standing   10/02/21 1556 77 102/79 Sitting   10/02/21 1554 70 (!) 122/52 Supine         Patient will benefit from skilled intervention to address the above impairments.    Functional Outcome Measure:  The patient scored 17 on the AMPAC outcome measure which is indicative of increased odds of requiring post acute SNF/IPR upon d/c.           PLAN :  Recommendations and Planned Interventions:   bed mobility training, transfer training, gait training, therapeutic exercises, edema management/control, patient and family training/education, and therapeutic activities    Frequency/Duration: Patient will be followed by physical therapy to address goals, PT Plan of Care: 5 times/week to address goals.    Recommendation for discharge: (in order for the patient to meet his/her long term goals): Therapy up to 5 days/week in Skilled nursing facility    Other factors to consider for discharge: concern for safely navigating or managing the home  environment    IF patient discharges home will need the following DME: continuing to assess with progress       SUBJECTIVE:   Patient stated "they go numb at night." re: Patient's hands and feet    OBJECTIVE DATA SUMMARY:   Patient received supine in bed and was agreeable to participate in PT session.   Patient was cleared by nursing to participate in PT session.     Past Medical History:   Diagnosis Date    (HFpEF) heart failure with preserved ejection fraction (HCC)     Anxiety and depression     Aortic valve replaced     S/p bovine aortic valve replacement.    Asthma      Chronic narcotic use     Chronic obstructive pulmonary disease (HCC)     Chronic pain     CKD (chronic kidney disease), stage III (HCC)     Baseline creatinine is 1.3-1.4 with GFR in the 40s.    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)     GERD (gastroesophageal reflux disease)     History of vascular access device 04/13/2021    4 FR Single PICC for LTABX: R cephalic vessell length 48 CM Max P leave @ 1 CM out; Arm circumferenc 40 CM    Hyperlipidemia     Hypothyroidism     Morbid obesity (HCC)     Neuropathy     Obstructive sleep apnea     Rhinitis      Past Surgical History:   Procedure Laterality Date    AORTIC VALVE REPLACEMENT      Bovine bioprosthetic    CARDIAC PROCEDURE N/A 08/30/2021    [facility]acard[facility] echocardi[facility]    COLONOSCOPY N/A 12/01/2020    [facility]alkinLee[facility] Alfred[facility]    EP DEVICE PROCEDURE N/A 08/30/2021    Ablation PVC [facility]    EP DEVICE PROCEDURE N/A 08/30/2021    Drug stimulation [facility]    EP DEVICE PROCEDURE N/A 08/30/2021    Transeptal [facility]    EP DEVICE PROCEDURE N/A 08/30/2021    Ep 3d mapping [facility]    INVASIVE VASCULAR N/A 08/30/2021    Ultrasound guided vascular access [facility] Thurston [facility]Matthew Ngo[facility]    PACEMAKER         Home Situation:  Social/Functional History  Lives With: Family (lives with son and DIL)  Type of Home: Trailer  Home Layout: One level  Home Access: Stairs to enter with rails  Entrance Stairs - Number of Steps: 5 steps  Bathroom Shower/Tub: Tub/Shower unit, Shower chair without back  FirefighterBathroom Toilet: Midwifetandard  Bathroom Equipment: None  Bathroom Accessibility: Accessible  Home Equipment: Cane, Product managerWheelchair-manual, Walker, rolling  Has the patient had two or more falls in the past year or any  fall with injury in the past year?: Yes  Receives Help From: Family  ADL Assistance: Independent  Homemaking Assistance: Independent  Ambulation Assistance: Independent  Transfer Assistance: Independent  Active Driver: Yes  Mode of Transportation: Set designerCar  Occupation: Retired    Cognitive/Behavioral Status:  Orientation  Overall Orientation Status: Within Normal Limits  Orientation Level: Oriented X4  Cognition  Overall Cognitive  Status: WNL    Skin: defer to RN notes    Edema: defer to RN    Hearing:   Hearing  Hearing: Within functional limits    Vision/Perceptual:          Vision  Vision: Impaired  Vision Exceptions: Wears glasses for reading       Strength:    Strength: Generally decreased, functional    Tone & Sensation:   Tone: Normal  Sensation: Impaired (reports numbness and tingling at night in bil hands an deet)    Coordination:  Coordination: Generally decreased, functional    Range Of Motion:  AROM: Generally decreased, functional       Functional Mobility:  Bed Mobility:     Bed Mobility Training  Bed Mobility Training: Yes  Supine to Sit: Stand-by assistance  Sit to Supine: Stand-by assistance  Scooting: Stand-by assistance  Transfers:     Art therapist: Yes  Overall Level of Assistance: Stand-by assistance  Sit to Stand: Stand-by assistance  Stand to Sit: Stand-by assistance  Balance:               Balance  Sitting: Intact  Standing: Impaired  Standing - Static: Fair  Standing - Dynamic: Fair;Constant support  Ambulation/Gait Training:            NTT                                                                                                                                                                                                                                                                    [employer] AM-PAC      Basic Mobility Inpatient Short Form (6-Clicks) Version 2  How much HELP from another person do you currently need... (If the patient hasn't done an  activity recently, how much help from another person do you think they would need if they tried?) Total A Lot A Little None   1.  Turning from your back to your side while in a flat bed without using bedrails? []   1 []   2 []   3  [x]   4   2.  Moving from lying on your back to sitting on the side of a flat bed without using bedrails? []   1 []   2 [x]   3  []   4   3.  Moving to and from a bed to a chair (including a wheelchair)? []   1 []   2 [x]   3  []   4   4. Standing up from a chair using your arms (e.g. wheelchair or bedside chair)? []   1 []   2 [x]   3  []   4   5.  Walking in hospital room? []   1 [x]   2 []   3  []   4   6.  Climbing 3-5 steps with a railing? []   1 [x]   2 []   3  []   4     Raw Score: 17/24                            Cutoff score ?171,2,3 had higher odds of discharging home with home health or need of SNF/IPR.    1. , , Vinoth , Passek, . .  Validity of the AM-PAC "6-Clicks" Inpatient Daily Activity and Basic Mobility Short Forms. Physical Therapy Mar 2014, 94 (3) 379-391; DOI: 10.2522/ptj.20130199  2. . Association of AM-PAC "6-Clicks" Basic Mobility and Daily Activity Scores With Discharge Destination. Phys Ther. 2021 Apr 4;101(4):pzab043. doi: 10.1093/ptj/pzab043. PMID: .  3. Herbold J, Rajaraman D, , Agayby K, [place] S. Activity Measure for Post-Acute Care "6-Clicks" Basic Mobility Scores Predict Discharge Destination After Acute Care Hospitalization in Select Patient Groups: A Retrospective, Observational Study. Arch Rehabil Res Clin Transl. 2022 Jul 16;4(3):100204. doi: 10.1016/j.arrct. . PMID: ; PMCID .  4. , Coster W, Ni P. AM-PAC Short Forms Manual 4.0. Revised 02/2018.                                                                                                                                                                                                                               Pain Rating:  Patient does not rate pain  Pain Intervention(s):   patient medicated for pain prior to session    Activity Tolerance:   Fair  and requires rest breaks    After treatment:   Patient left in no apparent distress in bed, Call bell within reach, Bed/ chair alarm activated, and Side rails x3    COMMUNICATION/EDUCATION:   The patient's plan of care was  discussed with: occupational therapist and registered nurse    Patient Education  Education Given To: Patient  Education Provided: Role of Therapy;Plan of Care;Transfer Training;Equipment    Thank you for this referral.  Chari Manning, PT, DPT  Minutes: 21

## 2021-10-02 NOTE — Progress Notes (Signed)
[facility]  [address] [place], [place] [address]  [phone]        Hospitalist Progress Note      NAME: Deborah [name]   DOB:  1955-08-23  MRM:  [id number]    Date/Time of service: 10/02/2021  11:39 AM       Subjective:     Chief Complaint:  Patient was personally seen and examined by me during this time period.  Chart reviewed.  Still c/o dizziness and weakness with ambulation.  Also back pain       Objective:       Vitals:       Last 24hrs VS reviewed since prior progress note. Most recent are:    Vitals:    10/02/21 1109   BP: (!) 105/54   Pulse: 70   Resp:    Temp:    SpO2:      SpO2 Readings from Last 6 Encounters:   10/02/21 91%   09/27/21 96%   08/30/21 96%   08/19/21 94%   08/14/21 96%   08/09/21 95%          Intake/Output Summary (Last 24 hours) at 10/02/2021 1139  Last data filed at 10/02/2021 0730  Gross per 24 hour   Intake 240 ml   Output --   Net 240 ml        Exam:     Physical Exam:    Gen:  Well-developed, well-nourished, morbid obesity, in no acute distress  HEENT:  Pink conjunctivae, PERRL, hearing intact to voice, moist mucous membranes  Neck:  Supple, without masses, thyroid non-tender  Resp:  no accessory muscle use, clear breath sounds without wheezes rales or rhonchi  Card:  2/6 murmurs, normal S1, S2 without thrills, trace edema  Abd:  Soft, non-tender, non-distended, normoactive bowel sounds are present  Musc:  No cyanosis or clubbing  Skin:  No rashes  Neuro:  Cranial nerves 3-12 are grossly intact, follows commands appropriately  Psych:  Good insight, oriented to person, place and time, alert    Medications Reviewed: (see below)    Lab Data Reviewed: (see below)    ______________________________________________________________________    Medications:     Current Facility-Administered Medications   Medication Dose Route Frequency    amiodarone (CORDARONE) tablet 200 mg  200 mg Oral Daily    atorvastatin (LIPITOR) tablet 10 mg  10 mg Oral Daily    [START ON  10/03/2021] bumetanide (BUMEX) tablet 1 mg  1 mg Oral Once per day on Mon Wed Fri    bumetanide (BUMEX) tablet 1 mg  1 mg Oral Once per day on Sun Tue Thu Sat    citalopram (CELEXA) tablet 40 mg  40 mg Oral Daily    fluticasone (FLONASE) 50 MCG/ACT nasal spray 2 spray  2 spray Nasal Daily PRN    gabapentin (NEURONTIN) capsule 100 mg  100 mg Oral TID    glipiZIDE (GLUCOTROL) tablet 5 mg  5 mg Oral BID AC    levothyroxine (SYNTHROID) tablet 150 mcg  150 mcg Oral QAM AC    mexiletine (MEXITIL) capsule 150 mg  150 mg Oral TID    miconazole (MICOTIN) 2 % powder   Topical PRN    midodrine (PROAMATINE) tablet 10 mg  10 mg Oral TID    montelukast (SINGULAIR) tablet 10 mg  10 mg Oral Nightly    pantoprazole (PROTONIX) tablet 40 mg  40 mg Oral QAM AC    polyethylene  glycol (GLYCOLAX) packet 17 g  17 g Oral Daily PRN    acidophilus probiotic capsule 1 capsule  1 capsule Oral Daily    sucralfate (CARAFATE) tablet 1 g  1 g Oral TID    traMADol (ULTRAM) tablet 50 mg  50 mg Oral Q6H PRN    traZODone (DESYREL) tablet 100 mg  100 mg Oral Nightly    glucose chewable tablet 16 g  4 tablet Oral PRN    dextrose bolus 10% 125 mL  125 mL IntraVENous PRN    Or    dextrose bolus 10% 250 mL  250 mL IntraVENous PRN    glucagon injection 1 mg  1 mg SubCUTAneous PRN    dextrose 10 % infusion   IntraVENous Continuous PRN    [START ON 10/03/2021] bumetanide (BUMEX) tablet 1 mg  1 mg Oral Once per day on Mon Wed Fri    [START ON 10/03/2021] potassium chloride (KLOR-CON) extended release tablet 20 mEq  20 mEq Oral Daily    HYDROmorphone (DILAUDID) injection 0.5 mg  0.5 mg IntraVENous Q4H PRN    insulin lispro (HUMALOG) injection vial 0-8 Units  0-8 Units SubCUTAneous TID WC    insulin lispro (HUMALOG) injection vial 0-4 Units  0-4 Units SubCUTAneous Nightly    sodium chloride flush 0.9 % injection 5-40 mL  5-40 mL IntraVENous 2 times per day    sodium chloride flush 0.9 % injection 5-40 mL  5-40 mL IntraVENous PRN    0.9 % sodium chloride infusion    IntraVENous PRN    enoxaparin Sodium (LOVENOX) injection 30 mg  30 mg SubCUTAneous BID    ondansetron (ZOFRAN-ODT) disintegrating tablet 4 mg  4 mg Oral Q8H PRN    Or    ondansetron (ZOFRAN) injection 4 mg  4 mg IntraVENous Q6H PRN    acetaminophen (TYLENOL) tablet 650 mg  650 mg Oral Q6H PRN    Or    acetaminophen (TYLENOL) suppository 650 mg  650 mg Rectal Q6H PRN          Lab Review:     Recent Labs     10/01/21  1920 10/02/21  0209   WBC 6.0 6.1   HGB 9.6* 9.2*   HCT 31.8* 30.1*   PLT 121* 113*     Recent Labs     10/01/21  1920 10/02/21  0209   NA 140 139   K 3.6 3.3*   CL 103 103   CO2 34* 32   BUN 17 16   MG 1.4* 1.4*   ALT 20  --      No results found for: GLUCPOC       Assessment / Plan:     66 yo hx of HTN, DM, dCHF, PVCs s/p recent ablation, pacemaker, bioprosthetic AVR, OSA on 2L O2, presented w/ dizziness, recurrent falls, back pain, severe lumbar stenosis    1) Dizziness/orthostatic hypotension/falls: unclear etiology.  Could be due to recent cardiac ablation vs chronic pain.  Cont midodrine.  Will consult Cards to eval    2) Chronic dCHF/bioprosthetic AVR: volume stable.  Cont bumex    3) PVCs: s/p recent ablation.  Has pacemaker.  Cont amio    4) Acute on chronic back pain/severe lumbar stenosis: will use IV dilaudid prn severe pain.  Consult Ortho, PT/OT    5) DM type 2: check A1C.  Cont glipizide, SSI    6) CKD 3: Cr stable, monitor     7) Chronic resp failure/OSA:  cont bipap, 2L home O2    **Prior records, notes, labs, radiology, and medications reviewed in [facility]**    Total time spent with patient care: 29 Minutes **I personally saw and examined the patient during this time period**                 Care Plan discussed with: Patient, nursing     Discussed:  Care Plan    Prophylaxis:  Lovenox    Disposition:  SNF/LTC           ___________________________________________________    Attending Physician: Paulo Fruit, MD

## 2021-10-02 NOTE — Consults (Signed)
D/W Dr Loma Newton  He will review chart and leave recommendations.

## 2021-10-02 NOTE — Care Coordination-Inpatient (Signed)
10/02/21 1511   Readmission Assessment   Number of Days since last admission? 8-30 days   Previous Disposition Home with Family   Who is being Interviewed Patient   What was the patient's/caregiver's perception as to why they think they needed to return back to the hospital? Other (Comment)  (multiple falls w/dizziness)   Did you visit your Primary Care Physician after you left the hospital, before you returned this time? Yes  (09/25/2021. saw cards last week)   Did you see a specialist, such as Cardiac, Pulmonary, Orthopedic Physician, etc. after you left the hospital? Yes   Who advised the patient to return to the hospital? Self-referral   Does the patient report anything that got in the way of taking their medications? No   In our efforts to provide the best possible care to you and others like you, can you think of anything that we could have done to help you after you left the hospital the first time, so that you might not have needed to return so soon? Improved written discharge instructions;Teach back instructions regarding management of illness;Discharge instructions that are concise, clear, and non contradictory     Ervin Knack, RN, [facility]  Care management

## 2021-10-03 LAB — BASIC METABOLIC PANEL
Anion Gap: 4 mmol/L — ABNORMAL LOW (ref 5–15)
BUN: 21 MG/DL — ABNORMAL HIGH (ref 6–20)
Bun/Cre Ratio: 17 (ref 12–20)
CO2: 32 mmol/L (ref 21–32)
Calcium: 9 MG/DL (ref 8.5–10.1)
Chloride: 100 mmol/L (ref 97–108)
Creatinine: 1.23 MG/DL — ABNORMAL HIGH (ref 0.55–1.02)
Est, Glom Filt Rate: 49 mL/min/{1.73_m2} — ABNORMAL LOW (ref >60)
Glucose: 141 mg/dL — ABNORMAL HIGH (ref 65–100)
Potassium: 3.7 mmol/L (ref 3.5–5.1)
Sodium: 136 mmol/L (ref 136–145)

## 2021-10-03 LAB — URINALYSIS WITH REFLEX TO CULTURE
BACTERIA, URINE: NEGATIVE /hpf
Bilirubin Urine: NEGATIVE
Blood, Urine: NEGATIVE
Glucose, UA: NEGATIVE mg/dL
Ketones, Urine: NEGATIVE mg/dL
Leukocyte Esterase, Urine: NEGATIVE
Nitrite, Urine: NEGATIVE
Protein, UA: NEGATIVE mg/dL
Specific Gravity, UA: 1.012 (ref 1.003–1.030)
Urobilinogen, Urine: 1 EU/dL (ref 0.2–1.0)
pH, Urine: 5.5 (ref 5.0–8.0)

## 2021-10-03 LAB — FERRITIN: Ferritin: 23 NG/ML — ABNORMAL LOW (ref 26–388)

## 2021-10-03 LAB — IRON AND TIBC
Iron % Saturation: 8 % — ABNORMAL LOW (ref 20–50)
Iron: 32 ug/dL — ABNORMAL LOW (ref 35–150)
TIBC: 406 ug/dL (ref 250–450)

## 2021-10-03 LAB — CBC
Hematocrit: 29.5 % — ABNORMAL LOW (ref 35.0–47.0)
Hemoglobin: 9 g/dL — ABNORMAL LOW (ref 11.5–16.0)
MCH: 26.2 PG (ref 26.0–34.0)
MCHC: 30.5 g/dL (ref 30.0–36.5)
MCV: 85.8 FL (ref 80.0–99.0)
MPV: 11 FL (ref 8.9–12.9)
Nucleated RBCs: 0 PER 100 WBC
Platelets: 97 10*3/uL — ABNORMAL LOW (ref 150–400)
RBC: 3.44 M/uL — ABNORMAL LOW (ref 3.80–5.20)
RDW: 14.6 % — ABNORMAL HIGH (ref 11.5–14.5)
WBC: 5.7 10*3/uL (ref 3.6–11.0)
nRBC: 0 10*3/uL (ref 0.00–0.01)

## 2021-10-03 LAB — MAGNESIUM: Magnesium: 1.7 mg/dL (ref 1.6–2.4)

## 2021-10-03 LAB — POCT GLUCOSE
POC Glucose: 131 mg/dL — ABNORMAL HIGH (ref 65–117)
POC Glucose: 145 mg/dL — ABNORMAL HIGH (ref 65–117)
POC Glucose: 139 mg/dL — ABNORMAL HIGH (ref 65–117)
POC Glucose: 106 mg/dL (ref 65–117)

## 2021-10-03 LAB — PHOSPHORUS: Phosphorus: 3.3 MG/DL (ref 2.6–4.7)

## 2021-10-03 LAB — HEMOGLOBIN A1C
Hemoglobin A1C: 5.3 % (ref 4.0–5.6)
eAG: 105 mg/dL

## 2021-10-03 MED ORDER — PANTOPRAZOLE SODIUM 40 MG PO TBEC
40 MG | Freq: Two times a day (BID) | ORAL | Status: AC
Start: 2021-10-03 — End: 2021-10-05
  Administered 2021-10-03 – 2021-10-05 (×5): 40 mg via ORAL

## 2021-10-03 MED FILL — LEVOTHYROXINE SODIUM 75 MCG PO TABS: 75 MCG | ORAL | Qty: 2

## 2021-10-03 MED FILL — MIDODRINE HCL 5 MG PO TABS: 5 MG | ORAL | Qty: 2

## 2021-10-03 MED FILL — ENOXAPARIN SODIUM 30 MG/0.3ML IJ SOSY: 30 MG/0.3ML | INTRAMUSCULAR | Qty: 0.3

## 2021-10-03 MED FILL — CYCLOBENZAPRINE HCL 10 MG PO TABS: 10 MG | ORAL | Qty: 1

## 2021-10-03 MED FILL — HYDROMORPHONE HCL 1 MG/ML IJ SOLN: 1 MG/ML | INTRAMUSCULAR | Qty: 0.5

## 2021-10-03 MED FILL — RISAQUAD PO CAPS: ORAL | Qty: 1

## 2021-10-03 MED FILL — ATORVASTATIN CALCIUM 10 MG PO TABS: 10 MG | ORAL | Qty: 1

## 2021-10-03 MED FILL — PANTOPRAZOLE SODIUM 40 MG PO TBEC: 40 MG | ORAL | Qty: 1

## 2021-10-03 MED FILL — GABAPENTIN 100 MG PO CAPS: 100 MG | ORAL | Qty: 1

## 2021-10-03 MED FILL — SUCRALFATE 1 G PO TABS: 1 GM | ORAL | Qty: 1

## 2021-10-03 MED FILL — POTASSIUM CHLORIDE ER 10 MEQ PO TBCR: 10 MEQ | ORAL | Qty: 2

## 2021-10-03 MED FILL — BUMETANIDE 1 MG PO TABS: 1 MG | ORAL | Qty: 1

## 2021-10-03 MED FILL — TRAZODONE HCL 100 MG PO TABS: 100 MG | ORAL | Qty: 1

## 2021-10-03 MED FILL — MONTELUKAST SODIUM 10 MG PO TABS: 10 MG | ORAL | Qty: 1

## 2021-10-03 MED FILL — TRAMADOL HCL 50 MG PO TABS: 50 MG | ORAL | Qty: 1

## 2021-10-03 MED FILL — GLIPIZIDE 5 MG PO TABS: 5 MG | ORAL | Qty: 1

## 2021-10-03 MED FILL — CITALOPRAM HYDROBROMIDE 20 MG PO TABS: 20 MG | ORAL | Qty: 2

## 2021-10-03 MED FILL — AMIODARONE HCL 200 MG PO TABS: 200 MG | ORAL | Qty: 1

## 2021-10-03 NOTE — Plan of Care (Signed)
Problem: Pain  Goal: Verbalizes/displays adequate comfort level or baseline comfort level  Outcome: Progressing      Problem: Safety - Adult  Goal: Free from fall injury  Outcome: Progressing       Problem: Skin/Tissue Integrity  Goal: Absence of new skin breakdown  Description: 1.  Monitor for areas of redness and/or skin breakdown  2.  Assess vascular access sites hourly  3.  Every 4-6 hours minimum:  Change oxygen saturation probe site  4.  Every 4-6 hours:  If on nasal continuous positive airway pressure, respiratory therapy assess nares and determine need for appliance change or resting period.  Outcome: Progressing

## 2021-10-03 NOTE — Plan of Care (Signed)
Problem: Pain  Goal: Verbalizes/displays adequate comfort level or baseline comfort level  Outcome: Progressing     Problem: Chronic Conditions and Co-morbidities  Goal: Patient's chronic conditions and co-morbidity symptoms are monitored and maintained or improved  Outcome: Progressing     Problem: Occupational Therapy - Adult  Goal: By Discharge: Performs self-care activities at highest level of function for planned discharge setting.  See evaluation for individualized goals.  Description: FUNCTIONAL STATUS PRIOR TO ADMISSION:  Per pt report, pt is primarily MI for self care (tub transfer bench) and functional transfers/mobility with [facility] however when unsteady DIL assists as needed.     HOME SUPPORT: Patient lived with son, DIL and granddaughter with family to assist as needed.    Occupational Therapy Goals:  Initiated 10/02/2021  1.  Patient will perform grooming with Modified Independence within 7 day(s).  2.  Patient will perform upper body dressing with Modified Independence within 7 day(s).  3.  Patient will perform lower body dressing with Modified Independence within 7 day(s).  4.  Patient will perform toilet transfers with Modified Independence  within 7 day(s).  5.  Patient will perform all aspects of toileting with Modified Independence within 7 day(s).  6.  Patient will participate in upper extremity therapeutic exercise/activities with Modified Independence for 10 minutes within 7 day(s).    7.  Patient will utilize energy conservation techniques during functional activities with verbal cues within 7 day(s).    10/02/2021 1120 by Madolyn Frieze, OT  Outcome: Progressing     Problem: Physical Therapy - Adult  Goal: By Discharge: Performs mobility at highest level of function for planned discharge setting.  See evaluation for individualized goals.  Description: FUNCTIONAL STATUS PRIOR TO ADMISSION: Patient was modified independent using a single point cane for functional mobility.    HOME SUPPORT PRIOR  TO ADMISSION: The patient lived with her family but did not require assistance. History of numerous falls preceded by dizziness over the past two weeks.     Physical Therapy Goals  Initiated 10/02/2021  1.  Patient will move from supine to sit and sit to supine, scoot up and down, and roll side to side in bed with modified independence within 7 day(s).    2.  Patient will perform sit to stand with modified independence within 7 day(s).  3.  Patient will transfer from bed to chair and chair to bed with modified independence using the least restrictive device within 7 day(s).  4.  Patient will ambulate with modified independence for 50 feet with the least restrictive device within 7 day(s).       10/02/2021 1618 by Chari Manning, PT  Outcome: Progressing     Problem: Safety - Adult  Goal: Free from fall injury  Outcome: Progressing

## 2021-10-03 NOTE — Plan of Care (Signed)
Problem: Physical Therapy - Adult  Goal: By Discharge: Performs mobility at highest level of function for planned discharge setting.  See evaluation for individualized goals.  Description: FUNCTIONAL STATUS PRIOR TO ADMISSION: Patient was modified independent using a single point cane for functional mobility.    HOME SUPPORT PRIOR TO ADMISSION: The patient lived with her family but did not require assistance. History of numerous falls preceded by dizziness over the past two weeks.     Physical Therapy Goals  Initiated 10/02/2021  1.  Patient will move from supine to sit and sit to supine, scoot up and down, and roll side to side in bed with modified independence within 7 day(s).    2.  Patient will perform sit to stand with modified independence within 7 day(s).  3.  Patient will transfer from bed to chair and chair to bed with modified independence using the least restrictive device within 7 day(s).  4.  Patient will ambulate with modified independence for 50 feet with the least restrictive device within 7 day(s).       Outcome: Progressing   PHYSICAL THERAPY TREATMENT    Patient: Deborah [name] (66 y.o. female)  Date: 10/03/2021  Diagnosis: Hypomagnesemia [E83.42]  Recurrent falls [R29.6]  Acute exacerbation of chronic low back pain [M54.50, G89.29]  Fall, initial encounter [W19.XXXA] Recurrent falls      Precautions: Fall Risk                    ASSESSMENT:  Patient continues to benefit from skilled PT services.Pt seen this AM.Pt supine to sit with mod assist.Pt CGA sit to stand.Pt ambulated 72ft to chair with RW CGA.Pt reported decreased back pain and knee pain mobilizing today.Pt left sitting.Pt progress with mobility.Continue goals.         PLAN:  Patient continues to benefit from skilled intervention to address the above impairments.  Continue treatment per established plan of care.    Recommendation for discharge: (in order for the patient to meet his/her long term goals): Therapy up to 5 days/week in Skilled  nursing facility    Other factors to consider for discharge:       IF patient discharges home will need the following DME: rolling walker       SUBJECTIVE:       OBJECTIVE DATA SUMMARY:   Critical Behavior:          Functional Mobility Training:  Bed Mobility:  Bed Mobility Training  Supine to Sit: Moderate assistance  Scooting: Contact-guard assistance;Minimum assistance  Transfers:  Transfer Training  Sit to Stand: Contact-guard assistance  Stand to Sit: Contact-guard assistance  Balance:  Balance  Sitting: Intact  Standing: With support  Standing - Dynamic: Fair   Ambulation/Gait Training:     Gait  Overall Level of Assistance: Contact-guard assistance  Base of Support: Narrowed  Speed/Cadence: Pace decreased (< 100 feet/min)  Step Length: Right shortened;Left shortened  Gait Abnormalities: Decreased step clearance  Distance (ft): 10 Feet  Assistive Device: Gait belt;Walker, rolling      Activity Tolerance:   Good    After treatment:   Pt left sitting in chair      COMMUNICATION/EDUCATION:   The patient's plan of care was discussed with: physical therapist           Waneta Martins. Sway Guttierrez, PTA  Minutes: 51

## 2021-10-03 NOTE — Care Coordination-Inpatient (Signed)
2:40 pm:  CM met with patient to obtain SNF choices.  Patient selected [facility], [facility] and [facility].  Referrals have been sent.

## 2021-10-03 NOTE — Progress Notes (Signed)
Chart reviewed  BP trending u[  Vitals:    10/03/21 0433   BP: (!) 125/95   Pulse: 69   Resp: 16   Temp: 99.3 F (37.4 C)   SpO2: 90%     Stop mexitil  No AV nodal agents

## 2021-10-03 NOTE — Progress Notes (Signed)
[place] [facility]  [address] [address] [place], [place] [address]  [phone]        Hospitalist Progress Note      NAME: Deborah [name]   DOB:  1955-02-23  MRM:  [id number]    Date/Time of service: 10/03/2021  12:30 PM       Subjective:     Chief Complaint:  Patient was personally seen and examined by me during this time period.  Chart reviewed.  No dizziness.  Weakness improving       Objective:       Vitals:       Last 24hrs VS reviewed since prior progress note. Most recent are:    Vitals:    10/03/21 0817   BP: (!) 112/57   Pulse: 74   Resp: 18   Temp: 99.5 F (37.5 C)   SpO2: 91%     SpO2 Readings from Last 6 Encounters:   10/03/21 91%   09/27/21 96%   08/30/21 96%   08/19/21 94%   08/14/21 96%   08/09/21 95%          Intake/Output Summary (Last 24 hours) at 10/03/2021 1230  Last data filed at 10/03/2021 1121  Gross per 24 hour   Intake --   Output 750 ml   Net -750 ml          Exam:     Physical Exam:    Gen:  Well-developed, well-nourished, morbid obesity, in no acute distress  HEENT:  Pink conjunctivae, PERRL, hearing intact to voice, moist mucous membranes  Neck:  Supple, without masses, thyroid non-tender  Resp:  no accessory muscle use, clear breath sounds without wheezes rales or rhonchi  Card:  2/6 murmurs, normal S1, S2 without thrills, trace edema  Abd:  Soft, non-tender, non-distended, normoactive bowel sounds are present  Musc:  No cyanosis or clubbing  Skin:  No rashes  Neuro:  Cranial nerves 3-12 are grossly intact, follows commands appropriately  Psych:  Good insight, oriented to person, place and time, alert    Medications Reviewed: (see below)    Lab Data Reviewed: (see below)    ______________________________________________________________________    Medications:     Current Facility-Administered Medications   Medication Dose Route Frequency    pantoprazole (PROTONIX) tablet 40 mg  40 mg Oral BID AC    amiodarone (CORDARONE) tablet 200 mg  200 mg Oral Daily    atorvastatin  (LIPITOR) tablet 10 mg  10 mg Oral Daily    bumetanide (BUMEX) tablet 1 mg  1 mg Oral Once per day on Mon Wed Fri    bumetanide (BUMEX) tablet 1 mg  1 mg Oral Once per day on Sun Tue Thu Sat    citalopram (CELEXA) tablet 40 mg  40 mg Oral Daily    fluticasone (FLONASE) 50 MCG/ACT nasal spray 2 spray  2 spray Nasal Daily PRN    gabapentin (NEURONTIN) capsule 100 mg  100 mg Oral TID    levothyroxine (SYNTHROID) tablet 150 mcg  150 mcg Oral QAM AC    miconazole (MICOTIN) 2 % powder   Topical PRN    midodrine (PROAMATINE) tablet 10 mg  10 mg Oral TID    montelukast (SINGULAIR) tablet 10 mg  10 mg Oral Nightly    polyethylene glycol (GLYCOLAX) packet 17 g  17 g Oral Daily PRN    acidophilus probiotic capsule 1 capsule  1 capsule Oral Daily    sucralfate (CARAFATE) tablet 1  g  1 g Oral TID    traMADol (ULTRAM) tablet 50 mg  50 mg Oral Q6H PRN    traZODone (DESYREL) tablet 100 mg  100 mg Oral Nightly    glucose chewable tablet 16 g  4 tablet Oral PRN    dextrose bolus 10% 125 mL  125 mL IntraVENous PRN    Or    dextrose bolus 10% 250 mL  250 mL IntraVENous PRN    glucagon injection 1 mg  1 mg SubCUTAneous PRN    dextrose 10 % infusion   IntraVENous Continuous PRN    bumetanide (BUMEX) tablet 1 mg  1 mg Oral Once per day on Mon Wed Fri    potassium chloride (KLOR-CON) extended release tablet 20 mEq  20 mEq Oral Daily    HYDROmorphone (DILAUDID) injection 0.5 mg  0.5 mg IntraVENous Q4H PRN    insulin lispro (HUMALOG) injection vial 0-8 Units  0-8 Units SubCUTAneous TID WC    insulin lispro (HUMALOG) injection vial 0-4 Units  0-4 Units SubCUTAneous Nightly    glipiZIDE (GLUCOTROL) tablet 5 mg  5 mg Oral QAM AC    cyclobenzaprine (FLEXERIL) tablet 5 mg  5 mg Oral TID PRN    diclofenac sodium (VOLTAREN) 1 % gel 2 g  2 g Topical BID    sodium chloride flush 0.9 % injection 5-40 mL  5-40 mL IntraVENous 2 times per day    sodium chloride flush 0.9 % injection 5-40 mL  5-40 mL IntraVENous PRN    0.9 % sodium chloride infusion    IntraVENous PRN    enoxaparin Sodium (LOVENOX) injection 30 mg  30 mg SubCUTAneous BID    ondansetron (ZOFRAN-ODT) disintegrating tablet 4 mg  4 mg Oral Q8H PRN    Or    ondansetron (ZOFRAN) injection 4 mg  4 mg IntraVENous Q6H PRN    acetaminophen (TYLENOL) tablet 650 mg  650 mg Oral Q6H PRN    Or    acetaminophen (TYLENOL) suppository 650 mg  650 mg Rectal Q6H PRN          Lab Review:     Recent Labs     10/01/21  1920 10/02/21  0209 10/03/21  0117   WBC 6.0 6.1 5.7   HGB 9.6* 9.2* 9.0*   HCT 31.8* 30.1* 29.5*   PLT 121* 113* 97*       Recent Labs     10/01/21  1920 10/02/21  0209 10/03/21  0117   NA 140 139 136   K 3.6 3.3* 3.7   CL 103 103 100   CO2 34* 32 32   BUN 17 16 21*   MG 1.4* 1.4* 1.7   PHOS  --   --  3.3   ALT 20  --   --        No results found for: GLUCPOC       Assessment / Plan:     66 yo hx of HTN, DM, dCHF, PVCs s/p recent ablation, pacemaker, bioprosthetic AVR, OSA on 2L O2, presented w/ dizziness, recurrent falls, back pain, severe lumbar stenosis    1) Dizziness/orthostatic hypotension/falls: now improving.  Unclear etiology.  Could be due to recent cardiac ablation vs chronic pain.  Cont midodrine.  Cards stopped Mexitil     2) Chronic dCHF/bioprosthetic AVR: volume stable.  Cont bumex    3) PVCs: s/p recent ablation.  Has pacemaker.  Cont amio    4) Acute on chronic back pain/severe cervical  and lumbar stenosis: CT with severe spinal stenosis at C6-C7 and L1-L2.  No further interventions per Ortho.  Will use IV dilaudid prn severe pain. PT/OT following, recommended SNF    5) DM type 2: A1C 5.3%.  Cont glipizide, SSI    6) CKD 3: Cr stable, monitor     7) Chronic resp failure/OSA: cont bipap, 2L home O2    8) Anemia: check iron panel, monitor Hgb     **Prior records, notes, labs, radiology, and medications reviewed in [facility]**    Total time spent with patient care: 37 Minutes **I personally saw and examined the patient during this time period**                 Care Plan discussed with:  Patient, nursing, CM     Discussed:  Care Plan    Prophylaxis:  Lovenox    Disposition:  SNF/LTC           ___________________________________________________    Attending Physician: Paulo Fruit, MD

## 2021-10-03 NOTE — Plan of Care (Signed)
Problem: Occupational Therapy - Adult  Goal: By Discharge: Performs self-care activities at highest level of function for planned discharge setting.  See evaluation for individualized goals.  Description: FUNCTIONAL STATUS PRIOR TO ADMISSION:  Per pt report, pt is primarily MI for self care (tub transfer bench) and functional transfers/mobility with [facility] however when unsteady DIL assists as needed.     HOME SUPPORT: Patient lived with son, DIL and granddaughter with family to assist as needed.    Occupational Therapy Goals:  Initiated 10/02/2021  1.  Patient will perform grooming with Modified Independence within 7 day(s).  2.  Patient will perform upper body dressing with Modified Independence within 7 day(s).  3.  Patient will perform lower body dressing with Modified Independence within 7 day(s).  4.  Patient will perform toilet transfers with Modified Independence  within 7 day(s).  5.  Patient will perform all aspects of toileting with Modified Independence within 7 day(s).  6.  Patient will participate in upper extremity therapeutic exercise/activities with Modified Independence for 10 minutes within 7 day(s).    7.  Patient will utilize energy conservation techniques during functional activities with verbal cues within 7 day(s).    Outcome: Progressing   OCCUPATIONAL THERAPY TREATMENT  Patient: Deborah [name] (66 y.o. female)  Date: 10/03/2021  Primary Diagnosis: Hypomagnesemia [E83.42]  Recurrent falls [R29.6]  Acute exacerbation of chronic low back pain [M54.50, G89.29]  Fall, initial encounter [W19.XXXA]       Precautions: Fall Risk    Chart, occupational therapy assessment, plan of care, and goals were reviewed.    ASSESSMENT  Patient continues to benefit from skilled OT services and is progressing towards goals. Ms. Ferg was received in the chair agreeable and motivated for OT interventions.  She performed multiple sit to stand transfers and stand-pivot transfer to [facility] for toileting requiring CGA + RW.   Patient required increased assistance for peri care and clothing management.  Patient returned to the chair and engaged in seated grooming and UB/LB bathing with good tolerance.  Patient is making good progress overall and would benefit from continued skilled OT.           PLAN :  Patient continues to benefit from skilled intervention to address the above impairments.  Continue treatment per established plan of care to address goals.    Recommend with staff:   Recommend patient be OOB to chair as frequently as tolerated; Goal of 3x/day for all meals for 60 minutes at a time.   For toileting needs, recommend transfers to/from [facility] with x1 assist.  Encourage patient involvement in personal care as able.      Recommend next OT session: Continue towards set OT goals.      Recommendation for discharge: (in order for the patient to meet his/her long term goals): Therapy up to 5 days/week in Skilled nursing facility    Other factors to consider for discharge: no additional factors    IF patient discharges home will need the following DME: none       SUBJECTIVE:   Patient agreeable to OT tx.    OBJECTIVE DATA SUMMARY:   Cognitive/Behavioral Status:  Orientation  Overall Orientation Status: Within Normal Limits  Orientation Level: Oriented X4  Cognition  Overall Cognitive Status: WNL    Functional Mobility and Transfers for ADLs:  Bed Mobility:  Bed Mobility Training  Bed Mobility Training: No  Supine to Sit: Moderate assistance  Scooting: Contact-guard assistance;Minimum assistance     Transfers:  Art therapist: Yes  Overall Level of Assistance: Contact-guard assistance;Additional time;Assist X1  Interventions: Verbal cues  Sit to Stand: Contact-guard assistance;Assist X1;Additional time  Stand to Sit: Contact-guard assistance;Assist X1;Additional time  Stand Pivot Transfers: Contact-guard assistance;Assist X1;Additional time  Toilet Transfer: Contact-guard assistance;Assist X1;Additional  time      Balance:  Standing: Impaired  Balance  Sitting: Intact  Standing: Impaired  Standing - Static: Fair  Standing - Dynamic: Fair      ADL Intervention:  Grooming: Setup   Grooming Skilled Clinical Factors: seated    UE Bathing: Minimal assistance       LE Bathing: Maximum assistance       UE Dressing: Setup       LE Dressing: Maximum assistance  LE Dressing Skilled Clinical Factors: difficulty with reach to distal LE    Toileting: Moderate assistance    Activity Tolerance:   Good  Please refer to the flowsheet for vital signs taken during this treatment.    After treatment:   Patient left in no apparent distress sitting up in chair, Call bell within reach, and Bed/ chair alarm activated    COMMUNICATION/EDUCATION:   The patient's plan of care was discussed with: physical therapy assistant, registered nurse, and patient.    Patient Education  Education Given To: Patient  Education Provided: Role of Therapy;Plan of Care;Home Exercise Program;ADL Adaptive Strategies;Transfer Training;Energy Conservation;Fall Prevention Strategies;Equipment  Education Method: Demonstration;Verbal  Barriers to Learning: None  Education Outcome: Verbalized understanding;Demonstrated understanding;Continued education needed    Thank you for this referral.  Waunita Schooner, OTR/L  Minutes: 41

## 2021-10-04 LAB — POCT GLUCOSE
POC Glucose: 117 mg/dL (ref 65–117)
POC Glucose: 171 mg/dL — ABNORMAL HIGH (ref 65–117)
POC Glucose: 132 mg/dL — ABNORMAL HIGH (ref 65–117)
POC Glucose: 127 mg/dL — ABNORMAL HIGH (ref 65–117)

## 2021-10-04 MED ORDER — SODIUM CHLORIDE 0.9 % IV SOLN
0.9 | INTRAVENOUS | Status: DC
Start: 2021-10-04 — End: 2021-10-04

## 2021-10-04 MED ORDER — IRON SUCROSE 20 MG/ML IV SOLN
20 MG/ML | Freq: Every day | INTRAVENOUS | Status: AC
Start: 2021-10-04 — End: 2021-10-06
  Administered 2021-10-04 – 2021-10-05 (×2): 200 mg via INTRAVENOUS

## 2021-10-04 MED ORDER — BUMETANIDE 1 MG PO TABS
1 MG | Freq: Every day | ORAL | Status: DC
Start: 2021-10-04 — End: 2021-10-05
  Administered 2021-10-04 – 2021-10-05 (×2): 1 mg via ORAL

## 2021-10-04 MED FILL — HYDROMORPHONE HCL 1 MG/ML IJ SOLN: 1 MG/ML | INTRAMUSCULAR | Qty: 0.5

## 2021-10-04 MED FILL — SUCRALFATE 1 G PO TABS: 1 GM | ORAL | Qty: 1

## 2021-10-04 MED FILL — MIDODRINE HCL 5 MG PO TABS: 5 MG | ORAL | Qty: 2

## 2021-10-04 MED FILL — POTASSIUM CHLORIDE ER 10 MEQ PO TBCR: 10 MEQ | ORAL | Qty: 2

## 2021-10-04 MED FILL — RISAQUAD PO CAPS: ORAL | Qty: 1

## 2021-10-04 MED FILL — BUMETANIDE 1 MG PO TABS: 1 MG | ORAL | Qty: 1

## 2021-10-04 MED FILL — AMIODARONE HCL 200 MG PO TABS: 200 MG | ORAL | Qty: 1

## 2021-10-04 MED FILL — LEVOTHYROXINE SODIUM 75 MCG PO TABS: 75 MCG | ORAL | Qty: 2

## 2021-10-04 MED FILL — MONTELUKAST SODIUM 10 MG PO TABS: 10 MG | ORAL | Qty: 1

## 2021-10-04 MED FILL — ENOXAPARIN SODIUM 30 MG/0.3ML IJ SOSY: 30 MG/0.3ML | INTRAMUSCULAR | Qty: 0.3

## 2021-10-04 MED FILL — GLIPIZIDE 5 MG PO TABS: 5 MG | ORAL | Qty: 1

## 2021-10-04 MED FILL — GABAPENTIN 100 MG PO CAPS: 100 MG | ORAL | Qty: 1

## 2021-10-04 MED FILL — PANTOPRAZOLE SODIUM 40 MG PO TBEC: 40 MG | ORAL | Qty: 1

## 2021-10-04 MED FILL — CITALOPRAM HYDROBROMIDE 20 MG PO TABS: 20 MG | ORAL | Qty: 2

## 2021-10-04 MED FILL — MEXILETINE HCL 150 MG PO CAPS: 150 MG | ORAL | Qty: 1

## 2021-10-04 MED FILL — VENOFER 20 MG/ML IV SOLN: 20 MG/ML | INTRAVENOUS | Qty: 10

## 2021-10-04 MED FILL — TRAZODONE HCL 100 MG PO TABS: 100 MG | ORAL | Qty: 1

## 2021-10-04 MED FILL — ATORVASTATIN CALCIUM 10 MG PO TABS: 10 MG | ORAL | Qty: 1

## 2021-10-04 NOTE — Progress Notes (Signed)
[place] [facility]  [address], [place] [address]  [phone]        Hospitalist Progress Note      NAME: Deborah [name]   DOB:  July 16, 1955  MRM:  [id number]    Date/Time of service: 10/04/2021  12:14 PM       Subjective:     Chief Complaint:  Patient was personally seen and examined by me during this time period.  Chart reviewed.  No chest pain, SOB       Objective:       Vitals:       Last 24hrs VS reviewed since prior progress note. Most recent are:    Vitals:    10/04/21 0844   BP: 109/60   Pulse: 75   Resp: 18   Temp: 98.7 F (37.1 C)   SpO2: 90%     SpO2 Readings from Last 6 Encounters:   10/04/21 90%   09/27/21 96%   08/30/21 96%   08/19/21 94%   08/14/21 96%   08/09/21 95%          Intake/Output Summary (Last 24 hours) at 10/04/2021 1214  Last data filed at 10/04/2021 1207  Gross per 24 hour   Intake 1840 ml   Output --   Net 1840 ml          Exam:     Physical Exam:    Gen:  Well-developed, well-nourished, morbid obesity, in no acute distress  HEENT:  Pink conjunctivae, PERRL, hearing intact to voice, moist mucous membranes  Neck:  Supple, without masses, thyroid non-tender  Resp:  no accessory muscle use, clear breath sounds without wheezes rales or rhonchi  Card:  2/6 murmurs, normal S1, S2 without thrills, trace edema  Abd:  Soft, non-tender, non-distended, normoactive bowel sounds are present  Musc:  No cyanosis or clubbing  Skin:  No rashes  Neuro:  Cranial nerves 3-12 are grossly intact, follows commands appropriately  Psych:  Good insight, oriented to person, place and time, alert    Medications Reviewed: (see below)    Lab Data Reviewed: (see below)    ______________________________________________________________________    Medications:     Current Facility-Administered Medications   Medication Dose Route Frequency    bumetanide (BUMEX) tablet 1 mg  1 mg Oral Daily    pantoprazole (PROTONIX) tablet 40 mg  40 mg Oral BID AC    amiodarone (CORDARONE) tablet 200 mg  200  mg Oral Daily    atorvastatin (LIPITOR) tablet 10 mg  10 mg Oral Daily    citalopram (CELEXA) tablet 40 mg  40 mg Oral Daily    fluticasone (FLONASE) 50 MCG/ACT nasal spray 2 spray  2 spray Nasal Daily PRN    gabapentin (NEURONTIN) capsule 100 mg  100 mg Oral TID    levothyroxine (SYNTHROID) tablet 150 mcg  150 mcg Oral QAM AC    miconazole (MICOTIN) 2 % powder   Topical PRN    midodrine (PROAMATINE) tablet 10 mg  10 mg Oral TID    montelukast (SINGULAIR) tablet 10 mg  10 mg Oral Nightly    polyethylene glycol (GLYCOLAX) packet 17 g  17 g Oral Daily PRN    acidophilus probiotic capsule 1 capsule  1 capsule Oral Daily    sucralfate (CARAFATE) tablet 1 g  1 g Oral TID    traMADol (ULTRAM) tablet 50 mg  50 mg Oral Q6H PRN    traZODone (DESYREL) tablet 100 mg  100 mg Oral Nightly    glucose chewable tablet 16 g  4 tablet Oral PRN    dextrose bolus 10% 125 mL  125 mL IntraVENous PRN    Or    dextrose bolus 10% 250 mL  250 mL IntraVENous PRN    glucagon injection 1 mg  1 mg SubCUTAneous PRN    dextrose 10 % infusion   IntraVENous Continuous PRN    potassium chloride (KLOR-CON) extended release tablet 20 mEq  20 mEq Oral Daily    HYDROmorphone (DILAUDID) injection 0.5 mg  0.5 mg IntraVENous Q4H PRN    insulin lispro (HUMALOG) injection vial 0-8 Units  0-8 Units SubCUTAneous TID WC    insulin lispro (HUMALOG) injection vial 0-4 Units  0-4 Units SubCUTAneous Nightly    glipiZIDE (GLUCOTROL) tablet 5 mg  5 mg Oral QAM AC    cyclobenzaprine (FLEXERIL) tablet 5 mg  5 mg Oral TID PRN    diclofenac sodium (VOLTAREN) 1 % gel 2 g  2 g Topical BID    sodium chloride flush 0.9 % injection 5-40 mL  5-40 mL IntraVENous 2 times per day    sodium chloride flush 0.9 % injection 5-40 mL  5-40 mL IntraVENous PRN    0.9 % sodium chloride infusion   IntraVENous PRN    enoxaparin Sodium (LOVENOX) injection 30 mg  30 mg SubCUTAneous BID    ondansetron (ZOFRAN-ODT) disintegrating tablet 4 mg  4 mg Oral Q8H PRN    Or    ondansetron (ZOFRAN)  injection 4 mg  4 mg IntraVENous Q6H PRN    acetaminophen (TYLENOL) tablet 650 mg  650 mg Oral Q6H PRN    Or    acetaminophen (TYLENOL) suppository 650 mg  650 mg Rectal Q6H PRN          Lab Review:     Recent Labs     10/01/21  1920 10/02/21  0209 10/03/21  0117   WBC 6.0 6.1 5.7   HGB 9.6* 9.2* 9.0*   HCT 31.8* 30.1* 29.5*   PLT 121* 113* 97*       Recent Labs     10/01/21  1920 10/02/21  0209 10/03/21  0117   NA 140 139 136   K 3.6 3.3* 3.7   CL 103 103 100   CO2 34* 32 32   BUN 17 16 21*   MG 1.4* 1.4* 1.7   PHOS  --   --  3.3   ALT 20  --   --        No results found for: GLUCPOC       Assessment / Plan:     66 yo hx of HTN, DM, dCHF, PVCs s/p recent ablation, pacemaker, bioprosthetic AVR, OSA on 2L O2, presented w/ dizziness, recurrent falls, back pain, severe lumbar stenosis    1) Dizziness/orthostatic hypotension/falls: now resolved.  Unclear etiology.  Could be due to recent cardiac ablation vs chronic pain.  Cont midodrine.  Cards stopped Mexitil     2) Chronic dCHF/bioprosthetic AVR: volume stable.  Cont bumex    3) PVCs: s/p recent ablation.  Has pacemaker.  Cont amio.  Cards stopped Mexitil     4) Acute on chronic back pain/severe cervical and lumbar stenosis: CT with severe spinal stenosis at C6-C7 and L1-L2.  No further interventions per Ortho.  Will use IV dilaudid prn severe pain. PT/OT following, awaiting SNF    5) DM type 2: A1C 5.3%.  Cont glipizide, SSI  6) CKD 3: Cr stable, monitor     7) Chronic resp failure/OSA: cont bipap, 2L home O2    8) Anemia: has low iron level. Will start IV iron while here, monitor Hgb     **Prior records, notes, labs, radiology, and medications reviewed in [facility]**    Total time spent with patient care: 30 Minutes **I personally saw and examined the patient during this time period**                 Care Plan discussed with: Patient, nursing, CM     Discussed:  Care Plan    Prophylaxis:  Lovenox    Disposition:   SNF/LTC           ___________________________________________________    Attending Physician: Paulo Fruit, MD

## 2021-10-04 NOTE — Care Coordination-Inpatient (Addendum)
4:59 pm:  Patient has been accepted by [facility] and she is in agreement with placement.  CM notified admissions coordinator, Pam, who will initiate insurance authorization.      3:15 pm:  CM provided patient with update on SNF referrals - [facility] has declined. [facility] and [address] are still pending.  Obtained additional choices and referrals were sent to the [facility] and [facility].

## 2021-10-04 NOTE — Plan of Care (Signed)
Problem: Physical Therapy - Adult  Goal: By Discharge: Performs mobility at highest level of function for planned discharge setting.  See evaluation for individualized goals.  Description: FUNCTIONAL STATUS PRIOR TO ADMISSION: Patient was modified independent using a single point cane for functional mobility.    HOME SUPPORT PRIOR TO ADMISSION: The patient lived with her family but did not require assistance. History of numerous falls preceded by dizziness over the past two weeks.     Physical Therapy Goals  Initiated 10/02/2021  1.  Patient will move from supine to sit and sit to supine, scoot up and down, and roll side to side in bed with modified independence within 7 day(s).    2.  Patient will perform sit to stand with modified independence within 7 day(s).  3.  Patient will transfer from bed to chair and chair to bed with modified independence using the least restrictive device within 7 day(s).  4.  Patient will ambulate with modified independence for 50 feet with the least restrictive device within 7 day(s).       Outcome: Progressing   PHYSICAL THERAPY TREATMENT    Patient: Deborah [name] (66 y.o. female)  Date: 10/04/2021  Diagnosis: Hypomagnesemia [E83.42]  Recurrent falls [R29.6]  Acute exacerbation of chronic low back pain [M54.50, G89.29]  Fall, initial encounter [W19.XXXA] Recurrent falls      Precautions: Fall Risk                    ASSESSMENT:  Patient continues to benefit from skilled PT services and is progressing towards goals. Communicated with nurse cleared for therapy. Patient already up on the recliner when received. Sit to stand CGA, ambulate with rolling walker in the room and towards the door CGA slow pace gait attempting to ambulate further out on the 5th floor hallway however patient feeling light headed and asked to just go back to the recliner. After sitting patient stated feels better now offered to ambulate more patient declined just want to rest on the recliner for now. OOB to chair as  tolerated performed some active range of motion exercise on both LE all planes. Educated and instructed patient to continue to do active range of motion exercise on both LE multiple times as tolerated while on bed and while on the chair. Recommend to be up on the chair at least every meal times when tolerated. Activated chair alarm and notified nurse who agreed to monitor patient.           PLAN:  Patient continues to benefit from skilled intervention to address the above impairments.  Continue treatment per established plan of care.    Recommendation for discharge: (in order for the patient to meet his/her long term goals): Therapy up to 5 days/week in Skilled nursing facility    Other factors to consider for discharge: no additional factors    IF patient discharges home will need the following DME: patient owns DME required for discharge       SUBJECTIVE:   Patient stated, "ok."    OBJECTIVE DATA SUMMARY:   Critical Behavior:          Functional Mobility Training:  Bed Mobility:  Bed Mobility Training  Bed Mobility Training: No (patient already up on the recliner when received)  Transfers:  Transfer Training  Transfer Training: Yes  Overall Level of Assistance: Contact-guard assistance  Interventions: Safety awareness training;Verbal cues  Sit to Stand: Contact-guard assistance  Stand to Sit: Contact-guard assistance  Stand Pivot  Transfers: Contact-guard assistance  Bed to Chair: Contact-guard assistance  Balance:  Balance  Sitting: Intact  Standing: Intact (with rolling walker)  Standing - Static: Fair  Standing - Dynamic: Fair   Ambulation/Gait Training:     Gait  Overall Level of Assistance: Contact-guard assistance  Interventions: Safety awareness training;Verbal cues  Base of Support: Widened  Speed/Cadence: Slow  Step Length: Right shortened;Left shortened  Gait Abnormalities: Path deviations;Step to gait  Distance (ft): 15 Feet  Assistive Device: Walker, rolling;Gait belt  Neuro Re-Education:                     Pain Rating:  0/10   Pain Intervention(s):   nursing notified and addressing    Activity Tolerance:   Good    After treatment:   Patient left in no apparent distress sitting up in chair, Call bell within reach, Bed/ chair alarm activated, and Heels elevated for pressure relief      COMMUNICATION/EDUCATION:   The patient's plan of care was discussed with: occupational therapy assistant and registered nurse    Patient Education  Education Given To: Patient  Education Provided: Role of Therapy;Plan of Care;Home Exercise Program;Precautions;Transfer Training;Fall Prevention Strategies;Equipment  Education Method: Verbal  Barriers to Learning: None  Education Outcome: Verbalized understanding;Continued education needed      Kodah Maret E DELA CRUZ, PT,WCC.  Minutes: 15

## 2021-10-04 NOTE — Plan of Care (Cosign Needed)
Problem: Occupational Therapy - Adult  Goal: By Discharge: Performs self-care activities at highest level of function for planned discharge setting.  See evaluation for individualized goals.  Description: FUNCTIONAL STATUS PRIOR TO ADMISSION:  Per pt report, pt is primarily MI for self care (tub transfer bench) and functional transfers/mobility with [facility] however when unsteady DIL assists as needed.     HOME SUPPORT: Patient lived with son, DIL and granddaughter with family to assist as needed.    Occupational Therapy Goals:  Initiated 10/02/2021  1.  Patient will perform grooming with Modified Independence within 7 day(s).  2.  Patient will perform upper body dressing with Modified Independence within 7 day(s).  3.  Patient will perform lower body dressing with Modified Independence within 7 day(s).  4.  Patient will perform toilet transfers with Modified Independence  within 7 day(s).  5.  Patient will perform all aspects of toileting with Modified Independence within 7 day(s).  6.  Patient will participate in upper extremity therapeutic exercise/activities with Modified Independence for 10 minutes within 7 day(s).    7.  Patient will utilize energy conservation techniques during functional activities with verbal cues within 7 day(s).    Outcome: Progressing   OCCUPATIONAL THERAPY TREATMENT  Patient: Deborah [name] (66 y.o. female)  Date: 10/04/2021  Primary Diagnosis: Hypomagnesemia [E83.42]  Recurrent falls [R29.6]  Acute exacerbation of chronic low back pain [M54.50, G89.29]  Fall, initial encounter [W19.XXXA]       Precautions: Fall Risk                Chart, occupational therapy assessment, plan of care, and goals were reviewed.    ASSESSMENT  Patient continues to benefit from skilled OT services and is progressing towards goals. Pt seated in chair, endorses dizziness upon sit to stand. She engaged with UB bathe with contact guard using bath cloths, set-up to doff/don gown. Max assist LB bathe/dress would benefit  from AE for tasks. Pt engaged with a few UE exercises. She stated she "does not do much at home but hang out with my grand daughter."             PLAN :  Patient continues to benefit from skilled intervention to address the above impairments.  Continue treatment per established plan of care to address goals.    Recommend with staff: Adl's, there ex, there act, out of bed for meals    Recommend next OT session: cont towards goals    Recommendation for discharge: (in order for the patient to meet his/her long term goals): Therapy up to 5 days/week in Skilled nursing facility    Other factors to consider for discharge: concern for safely navigating or managing the home environment    IF patient discharges home will need the following DME:        SUBJECTIVE:   Patient stated "thank you."    OBJECTIVE DATA SUMMARY:   Cognitive/Behavioral Status:  Orientation  Overall Orientation Status: Within Normal Limits  Orientation Level: Oriented X4       Functional Mobility and Transfers for ADLs:  Bed Mobility:  Bed Mobility Training  Bed Mobility Training: No (patient already up on the recliner when received)     Transfers:   Art therapist: Yes  Overall Level of Assistance: Contact-guard assistance  Interventions: Safety awareness training;Verbal cues  Sit to Stand: Contact-guard assistance  Stand to Sit: Contact-guard assistance  Stand Pivot Transfers: Contact-guard assistance  Bed to Chair: Contact-guard assistance  Balance:  Standing: Intact (with rolling walker)  Balance  Sitting: Intact  Standing: Intact (with rolling walker)  Standing - Static: Fair  Standing - Dynamic: Fair      ADL Intervention:       UE Bathing: Contact guard assistance  UE Bathing Skilled Clinical Factors: seated    LE Bathing: Maximum assistance       UE Dressing: Setup       LE Dressing: Maximum assistance     UE there ex  Shoulder circles forwards and backwards 1 set x 10  Chest presses 1 set x 10  Activity Tolerance:    Fair   Please refer to the flowsheet for vital signs taken during this treatment.    After treatment:   Patient left in no apparent distress sitting up in chair and Call bell within reach    COMMUNICATION/EDUCATION:   The patient's plan of care was discussed with: physical therapist and occupational therapist    Patient Education  Education Given To: Patient  Education Provided: Role of Therapy  Education Method: Verbal  Barriers to Learning: None  Education Outcome: Verbalized understanding    Thank you for this referral.  Lurena Joiner A. Stratton Villwock, COTA/L  Minutes: 17

## 2021-10-05 LAB — BASIC METABOLIC PANEL
Anion Gap: 4 mmol/L — ABNORMAL LOW (ref 5–15)
BUN: 24 MG/DL — ABNORMAL HIGH (ref 6–20)
Bun/Cre Ratio: 17 (ref 12–20)
CO2: 32 mmol/L (ref 21–32)
Calcium: 9 MG/DL (ref 8.5–10.1)
Chloride: 101 mmol/L (ref 97–108)
Creatinine: 1.39 MG/DL — ABNORMAL HIGH (ref 0.55–1.02)
Est, Glom Filt Rate: 42 mL/min/{1.73_m2} — ABNORMAL LOW (ref >60)
Glucose: 101 mg/dL — ABNORMAL HIGH (ref 65–100)
Potassium: 3.6 mmol/L (ref 3.5–5.1)
Sodium: 137 mmol/L (ref 136–145)

## 2021-10-05 LAB — POCT GLUCOSE
POC Glucose: 115 mg/dL (ref 65–117)
POC Glucose: 112 mg/dL (ref 65–117)
POC Glucose: 158 mg/dL — ABNORMAL HIGH (ref 65–117)
POC Glucose: 120 mg/dL — ABNORMAL HIGH (ref 65–117)

## 2021-10-05 LAB — CBC
Hematocrit: 30.2 % — ABNORMAL LOW (ref 35.0–47.0)
Hemoglobin: 9.1 g/dL — ABNORMAL LOW (ref 11.5–16.0)
MCH: 25.9 PG — ABNORMAL LOW (ref 26.0–34.0)
MCHC: 30.1 g/dL (ref 30.0–36.5)
MCV: 86 FL (ref 80.0–99.0)
MPV: 11.4 FL (ref 8.9–12.9)
Nucleated RBCs: 0 PER 100 WBC
Platelets: 114 10*3/uL — ABNORMAL LOW (ref 150–400)
RBC: 3.51 M/uL — ABNORMAL LOW (ref 3.80–5.20)
RDW: 14.6 % — ABNORMAL HIGH (ref 11.5–14.5)
WBC: 5.4 10*3/uL (ref 3.6–11.0)
nRBC: 0 10*3/uL (ref 0.00–0.01)

## 2021-10-05 LAB — PHOSPHORUS: Phosphorus: 3.5 MG/DL (ref 2.6–4.7)

## 2021-10-05 LAB — BRAIN NATRIURETIC PEPTIDE: NT Pro-BNP: 744 PG/ML — ABNORMAL HIGH (ref <125)

## 2021-10-05 LAB — MAGNESIUM: Magnesium: 1.6 mg/dL (ref 1.6–2.4)

## 2021-10-05 MED ORDER — TRAMADOL HCL 50 MG PO TABS
50 MG | ORAL_TABLET | Freq: Four times a day (QID) | ORAL | 0 refills | Status: AC | PRN
Start: 2021-10-05 — End: 2021-10-12

## 2021-10-05 MED ORDER — BUMETANIDE 1 MG PO TABS
1 MG | ORAL_TABLET | Freq: Every day | ORAL | 0 refills | Status: AC
Start: 2021-10-05 — End: ?

## 2021-10-05 MED ORDER — CYCLOBENZAPRINE HCL 5 MG PO TABS
5 MG | ORAL_TABLET | Freq: Three times a day (TID) | ORAL | 0 refills | Status: AC | PRN
Start: 2021-10-05 — End: 2021-10-15

## 2021-10-05 MED ORDER — GABAPENTIN 100 MG PO CAPS
100 MG | ORAL_CAPSULE | Freq: Three times a day (TID) | ORAL | 0 refills | Status: AC
Start: 2021-10-05 — End: 2021-11-04

## 2021-10-05 MED FILL — PANTOPRAZOLE SODIUM 40 MG PO TBEC: 40 MG | ORAL | Qty: 1

## 2021-10-05 MED FILL — AMIODARONE HCL 200 MG PO TABS: 200 MG | ORAL | Qty: 1

## 2021-10-05 MED FILL — GABAPENTIN 100 MG PO CAPS: 100 MG | ORAL | Qty: 1

## 2021-10-05 MED FILL — MONTELUKAST SODIUM 10 MG PO TABS: 10 MG | ORAL | Qty: 1

## 2021-10-05 MED FILL — HYDROMORPHONE HCL 1 MG/ML IJ SOLN: 1 MG/ML | INTRAMUSCULAR | Qty: 0.5

## 2021-10-05 MED FILL — SUCRALFATE 1 G PO TABS: 1 GM | ORAL | Qty: 1

## 2021-10-05 MED FILL — ATORVASTATIN CALCIUM 10 MG PO TABS: 10 MG | ORAL | Qty: 1

## 2021-10-05 MED FILL — GLIPIZIDE 5 MG PO TABS: 5 MG | ORAL | Qty: 1

## 2021-10-05 MED FILL — MIDODRINE HCL 5 MG PO TABS: 5 MG | ORAL | Qty: 2

## 2021-10-05 MED FILL — TRAZODONE HCL 100 MG PO TABS: 100 MG | ORAL | Qty: 1

## 2021-10-05 MED FILL — VENOFER 20 MG/ML IV SOLN: 20 MG/ML | INTRAVENOUS | Qty: 10

## 2021-10-05 MED FILL — BUMETANIDE 1 MG PO TABS: 1 MG | ORAL | Qty: 1

## 2021-10-05 MED FILL — ENOXAPARIN SODIUM 30 MG/0.3ML IJ SOSY: 30 MG/0.3ML | INTRAMUSCULAR | Qty: 0.3

## 2021-10-05 MED FILL — CITALOPRAM HYDROBROMIDE 20 MG PO TABS: 20 MG | ORAL | Qty: 2

## 2021-10-05 MED FILL — RISAQUAD PO CAPS: ORAL | Qty: 1

## 2021-10-05 MED FILL — LEVOTHYROXINE SODIUM 75 MCG PO TABS: 75 MCG | ORAL | Qty: 2

## 2021-10-05 MED FILL — POTASSIUM CHLORIDE ER 10 MEQ PO TBCR: 10 MEQ | ORAL | Qty: 2

## 2021-10-05 NOTE — Progress Notes (Signed)
Patient's initial BP was 68/54 the recheck was 124/72 HR 75. Patient not in distress at this time. Denies chest pain and SOB.

## 2021-10-05 NOTE — H&P (Addendum)
Called [facility] [phone] and spoke with Eunice Blase to transfer me to receiving nurse for patient.    Gave report to receiving nurse Marchelle Folks, report included SBAR, MAR, Recent results and opportunities to ask questions given.    Son expected to transport patient at 38. Hard script for gabapentin, tramadol and flexeril sent with patient.     1628  Discharge instructions given to patient. Patient verbalized understanding, and opportunities to ask questions given. Signed AVS in the chart. Volunteer services to be requested to take patient to the lobby. Peripheral IV removed, catheter tip intact. Patient is not in distress.

## 2021-10-05 NOTE — Plan of Care (Signed)
Problem: Occupational Therapy - Adult  Goal: By Discharge: Performs self-care activities at highest level of function for planned discharge setting.  See evaluation for individualized goals.  Description: FUNCTIONAL STATUS PRIOR TO ADMISSION:  Per pt report, pt is primarily MI for self care (tub transfer bench) and functional transfers/mobility with [facility] however when unsteady DIL assists as needed.     HOME SUPPORT: Patient lived with son, DIL and granddaughter with family to assist as needed.    Occupational Therapy Goals:  Initiated 10/02/2021  1.  Patient will perform grooming with Modified Independence within 7 day(s).  2.  Patient will perform upper body dressing with Modified Independence within 7 day(s).  3.  Patient will perform lower body dressing with Modified Independence within 7 day(s).  4.  Patient will perform toilet transfers with Modified Independence  within 7 day(s).  5.  Patient will perform all aspects of toileting with Modified Independence within 7 day(s).  6.  Patient will participate in upper extremity therapeutic exercise/activities with Modified Independence for 10 minutes within 7 day(s).    7.  Patient will utilize energy conservation techniques during functional activities with verbal cues within 7 day(s).    Outcome: Progressing     OCCUPATIONAL THERAPY TREATMENT  Patient: Deborah [name] (66 y.o. female)  Date: 10/05/2021  Primary Diagnosis: Hypomagnesemia [E83.42]  Recurrent falls [R29.6]  Acute exacerbation of chronic low back pain [M54.50, G89.29]  Fall, initial encounter [W19.XXXA]       Precautions: Fall Risk                Chart, occupational therapy assessment, plan of care, and goals were reviewed.    ASSESSMENT  Patient received seated in recliner A&Ox4 and agreeable for OT treatment. Patient continues to benefit from skilled OT services and is progressing towards goals. Patient is SBA scooting chair level, SBA sit<->Stand and toilet transfer and seated at sink for bathing task.  Patient is SBA for UB bathing (assist for back only) and SBA bathing down to knees (max A distally for bathing) and SBA UB Dressing. Patient is SBA peri area hygiene however 2/2 back pain having difficulty with posterior reach for bowel hygiene requiring max A for task. Patient unable to reach bil Le while seated in chair requiring max A donn socks however demonstrating ability to donn socks in long sitting position in chair. Patient would benefit from continued skilled OT services while at [facility] in order to increase safety and independence with self care and functional transfers/mobility. Recommend discharge to SNF when medically appropriate.              PLAN :  Patient continues to benefit from skilled intervention to address the above impairments.  Continue treatment per established plan of care to address goals.    Recommend with staff: up to chair for meals, up to commode for toileting, self care seated at sink    Recommend next OT session: continue to progress towards goals    Recommendation for discharge: (in order for the patient to meet his/her long term goals): Therapy up to 5 days/week in Skilled nursing facility    Other factors to consider for discharge: time since onset, severity of deficits, PLOf    IF patient discharges home will need the following DME: TBD       SUBJECTIVE:   Patient stated "I feel better."    OBJECTIVE DATA SUMMARY:   Cognitive/Behavioral Status:  Orientation  Overall Orientation Status: Within Normal Limits  Orientation Level:  Oriented X4  Cognition  Overall Cognitive Status: WNL    Functional Mobility and Transfers for ADLs:    Transfers:   Art therapist: Yes  Sit to Stand: Stand-by assistance  Stand to Sit: Stand-by assistance  Toilet Transfer: Stand-by assistance    Balance:     Balance  Sitting: Intact  Standing - Static: Constant support;Good  Standing - Dynamic: Constant support;Good (short distances in room)    ADL Intervention:    Grooming: Stand by  assistance   Grooming Skilled Clinical Factors: seated at sink    UE Bathing: Stand by assistance  UE Bathing Skilled Clinical Factors: seated at sink     LE Bathing Skilled Clinical Factors: SBA bathe to knees, max A distally    UE Dressing: Stand by assistance      LE Dressing Skilled Clinical Factors: SBA long sitting in recliner, max A seated EOB    Toileting: Maximum assistance  Toileting Skilled Clinical Factors: SBA peri area hygiene, max A bowel hygiene 2/2 pain with reaching    Pain Rating:  6/10   Pain Intervention(s):   nursing notified      Activity Tolerance:   Good and requires rest breaks  Please refer to the flowsheet for vital signs taken during this treatment.    After treatment:   Patient left in no apparent distress sitting up in chair and Call bell within reach    COMMUNICATION/EDUCATION:   The patient's plan of care was discussed with: registered nurse    Patient Education  Education Given To: Patient  Education Provided: Role of Therapy;Plan of Care;Transfer Training;ADL Adaptive Strategies  Education Method: Demonstration;Verbal  Barriers to Learning: None  Education Outcome: Verbalized understanding;Demonstrated understanding    Thank you for this referral.  Madolyn Frieze, OT  Minutes: [phone]

## 2021-10-05 NOTE — Discharge Instructions (Signed)
HOSPITALIST DISCHARGE INSTRUCTIONS          NAME: Deborah [name]   DOB:  July 18, 1955   MRN:  [id number]     Date/Time:  10/05/2021 2:04 PM    ADMIT DATE: 10/01/2021     DISCHARGE DATE: 10/05/2021     ADMITTING DIAGNOSIS:  Falling, weakness, cervical and lumbar spine stenosis    DISCHARGE DIAGNOSIS:  same    MEDICATIONS:  See after visit summary       It is important that you take the medication exactly as they are prescribed.   Keep your medication in the bottles provided by the pharmacist and keep a list of the medication names, dosages, and times to be taken in your wallet.   Brinlee Gambrell not take other medications without consulting your doctor     Pain Management: per above medications    What to Salvatore Shear at Home    Recommended diet:  regular diet    Recommended activity: activity as tolerated    1) Return to the hospital if you feel worse    2) If you experience any of the following symptoms then please call your primary care physician or return to the emergency room if you cannot get hold of your doctor:  Fever, chills, nausea, vomiting, diarrhea, change in mentation, falling, bleeding, shortness of breath, chest pain, severe headache, severe abdominal pain,     3) Follow up with your doctors as directed    Follow Up:  [facility] [address] [place])  [address]  [place] [address]  [phone]        [name], APRN - NP  [address]  [place] [address]  [phone]    Schedule an appointment as soon as possible for a visit in 1 week(s)    .      Information obtained by :  I understand that if any problems occur once I am at home I am to contact my physician.    I understand and acknowledge receipt of the instructions indicated above.                                                                                                                                           Physician's or R.N.'s Signature                                                                  Date/Time  Patient or Representative Signature                                                          Date/Time          Cervical Spinal Stenosis: Care Instructions  Overview     Spinal stenosis is a narrowing of the canal that surrounds the spinal cord and nerve roots. Sometimes bone and other tissue grow into this canal and press on the nerves that branch out from the spinal cord. This can happen as a part of aging.  When the narrowing happens in your neck, it's called cervical spinal stenosis. It often causes stiffness, pain, numbness, and weakness in the neck, shoulders, arms, hands, or legs. It can even cause problems with your balance, coordination, and bowel or bladder control. But some people have no symptoms.  You may be able to get relief from the symptoms of spinal stenosis by taking medicine. Your doctor may suggest physical therapy and exercises to keep your spine strong and flexible. Some people try steroid shots to reduce swelling. If pain and numbness in your neck, arms, or legs are still so bad that you cannot Donise Woodle your normal activities, you may need surgery.  Follow-up care is a key part of your treatment and safety. Be sure to make and go to all appointments, and call your doctor if you are having problems. It's also a good idea to know your test results and keep a list of the medicines you take.  How can you care for yourself at home?  Ask your doctor if you can take an over-the-counter pain medicine, such as acetaminophen (Tylenol), ibuprofen (Advil, Motrin), or naproxen (Aleve). Be safe with medicines. Read and follow all instructions on the label.  Damesha Lawler not take two or more pain medicines at the same time unless the doctor told you to. Many pain medicines have acetaminophen, which is Tylenol. Too much acetaminophen (Tylenol) can be harmful.  Change positions often when you are  standing or sitting. This may reduce pressure on the spinal cord and its nerves.  When you rest, use pillows or towel rolls to support your neck and head in a comfortable position.  Follow your doctor's instructions about activity. The doctor may tell you not to Adelina Collard sports or activities that could injure your neck.  Stretch your neck and shoulders as your doctor or physical therapist recommends. If your doctor says it is okay to Lindsay Straka them, these exercises may help:  Neck stretches to the side. Keep your shoulders relaxed and slowly tilt your head straight over toward one shoulder. Hold for 15 seconds. Let the weight of your head stretch your muscles. Then Montrail Mehrer the same toward the other shoulder.  Neck rotations. Keep your chin level and slowly turn your head to one side. Hold for 15 seconds. Then Leotis Isham the same to the other side.  Shoulder rolls. Roll your shoulders up, then back, and then down in a smooth, circular motion. Repeat several times.  When should you call for help?   Call 911 anytime you think you may need emergency care. For example, call if:    You are unable to move an arm or a leg at all.   Call your doctor now or seek immediate medical care if:    You  have new or worse symptoms in your arms, legs, belly, or buttocks. Symptoms may include:  Numbness or tingling.  Weakness.  Pain.     You lose bladder or bowel control.   Watch closely for changes in your health, and be sure to contact your doctor if:    You Avalon Coppinger not get better as expected.   Where can you learn more?  Go to [employer] and enter M594 to learn more about "Cervical Spinal Stenosis: Care Instructions."  Current as of: August 14, 2021               Content Version: 13.8   2006-2023 [employer], Inc[employer].   Care instructions adapted under license by [facility]. If you have questions about a medical condition or this instruction, always ask your healthcare professional. [employer], Inc[employer] disclaims any warranty or  liability for your use of this information.         Lumbar Spinal Stenosis: Care Instructions  Overview     Stenosis in the spine is a narrowing of the canal that is around the spinal cord and nerve roots in your back. It can happen as part of aging. Sometimes bone and other tissue grow into this canal and press on the nerves that branch out from the spinal cord. This can cause pain, numbness, and weakness. When it happens in the lower part of your back, it is called lumbar spinal stenosis. It can cause problems in the legs, feet, and rear end (buttocks).  You may be able to get relief from the symptoms of spinal stenosis by taking medicine. Your doctor may suggest physical therapy and exercises to keep your spine strong and flexible. Some people try steroid shots to reduce swelling. If pain and numbness in your legs are still so bad that you cannot Javante Nilsson your normal activities, you may need surgery.  Follow-up care is a key part of your treatment and safety. Be sure to make and go to all appointments, and call your doctor if you are having problems. It's also a good idea to know your test results and keep a list of the medicines you take.  How can you care for yourself at home?  Take an over-the-counter pain medicine. Nonsteroidal anti-inflammatory drugs (NSAIDs) such as ibuprofen or naproxen seem to work best. But if you can't take NSAIDs, you can try acetaminophen. Be safe with medicines. Read and follow all instructions on the label.  Cailee Blanke not take two or more pain medicines at the same time unless the doctor told you to. Many pain medicines have acetaminophen, which is Tylenol. Too much acetaminophen (Tylenol) can be harmful.  Stay at a healthy weight. Being overweight puts extra strain on your spine.  Change positions often when you sit or stand. This can ease pain. It may also reduce pressure on the spinal cord and its nerves.  Avoid doing things that make your symptoms worse. Walking downhill and standing for a  long time may cause pain.  Stretch and strengthen your back muscles as your doctor or physical therapist recommends. If your doctor says it is okay to Jennavieve Arrick them, these exercises may help.  Lie on your back with your knees bent. Gently pull one bent knee to your chest. Put that foot back on the floor, and then pull the other knee to your chest.  Brigette Hopfer pelvic tilts. Lie on your back with your knees bent. Tighten your stomach muscles. Pull your belly button (navel) in and up toward your ribs. You  should feel like your back is pressing to the floor and your hips and pelvis are slightly lifting off the floor. Hold for 6 seconds while breathing smoothly.  Stand with your back flat against a wall. Slowly slide down until your knees are slightly bent. Hold for 10 seconds, then slide back up the wall.  Remove or change anything in your house that may cause you to fall. Keep walkways clear of clutter, electrical cords, and throw rugs.  When should you call for help?   Call 911 anytime you think you may need emergency care. For example, call if:    You are unable to move a leg at all.   Call your doctor now or seek immediate medical care if:    You have new or worse symptoms in your legs, belly, or buttocks. Symptoms may include:  Numbness or tingling.  Weakness.  Pain.     You lose bladder or bowel control.   Watch closely for changes in your health, and be sure to contact your doctor if:    You have a fever, lose weight, or don't feel well.     You are not getting better as expected.   Where can you learn more?  Go to [employer] and enter X327 to learn more about "Lumbar Spinal Stenosis: Care Instructions."  Current as of: August 14, 2021               Content Version: 13.8   2006-2023 [employer]wise, Inc[employer].   Care instructions adapted under license by [facility]. If you have questions about a medical condition or this instruction, always ask your healthcare professional. Health[employer], Inc[employer]  disclaims any warranty or liability for your use of this information.

## 2021-10-05 NOTE — Care Coordination-Inpatient (Addendum)
10/05/2021  2:21 PM     10/05/21 1420   Service Assessment   Patient Orientation Alert and Oriented   Discharge Planning   Patient expects to be discharged to: Skilled nursing facility  ([facility])   [place] Discharge   Mode of Transport at Discharge Self  (Son)   Hospital Transport Time of Discharge 0500     Transition of [place] to SNF/Rehab    Communication to Patient/Family:  Met with patient and family and they are agreeable to the transition plan. The Plan for Transition of Care is related to the following treatment goals: [place]    The Patient and/or patient representative was provided with a choice of provider and agrees  with the discharge plan.      Yes '[x]'  No '[]'     A Freedom of choice list was provided with basic dialogue that supports the patient's individualized plan of care/goals and shares the quality data associated with the providers.       Yes '[x]'  No '[]'     SNF/Rehab Transition:  Patient has been accepted to [facility] and meets criteria for admission.   Patient will transported by son Christen Bame and expected to leave at 4:00 PM    Communication to SNF/Rehab:  Bedside RN Larena Glassman  has been notified to update the transition plan to [facility] [address] and call report [phone]  Discharge information has been updated on the AVS. And communicated to facility via [facility] [employer], or [place]     Discharge instructions to be fax'd to facility at [facility] #).    '[]'  BCPI-A  Patient has been identified as part of the  [employer].  For Care Coordination associated with that Bundle Program, please contact   Bundle information has been communication to    Nursing Please include all hard scripts for controlled substances, med rec and dc summary, and AVS in packet.     Reviewed and confirmed with facility, Pam , can manage the patient care needs for the following:     [place] with (X) only those applicable:  Medication:  '[x]' Medications are  available at the facility  '[]' IV Antibiotics    '[x]' Controlled Substance - hard copies available sent.  '[]' Weekly Labs    Equipment:  '[]' CPAP/BiPAP  '[]' Wound Vacuum  '[]' Foley or Urinary Device  '[]' PICC/Central Line  '[]' Nebulizer  '[]' Ventilator    Treatment:  '[]' Isolation (for MRSA, VRE, etc.)  '[]' Surgical Drain Management  '[]' Tracheostomy Care  '[]' Dressing Changes  '[]' Dialysis with transportation  '[]' PEG Care  '[]' Oxygen  '[]' Daily Weights for Heart Failure    Dietary:  '[x]' Any diet limitations  '[]' Tube Feedings   '[]' Total Parenteral Management (TPN)    Financial Resources:  '[]' Medicaid Application Completed    '[]' UAI Completed and copy given to pt/family  and copy given to pt/family  '[]' A screening has previously been completed.    '[]' Level II Completed    '[x]'  Private pay individual who will not become   financially eligible for Medicaid within 6 months from admission to a [facility] facility.     '[]'  Individual refused to have screening conducted.     '[]' Medicaid Application Completed    '[]' The screening denied because it was determined individual did not need/did not qualify for nursing facility level of care.  '[]'  Out of state residents seeking direct admission to a [facility] facility.  '[]'  Individuals who are inpatients of an out of state hospital, or  in state or out of state veterans/Military hospital and seek direct admission to a [facility] facility  '[]'  Individuals who are pateints or residents of a state owned/operated facility that is licensed by [place] ([facility]) and seek direct admission to [place] facility  '[]'  A screening not required for enrollment in [facility] services as set out in 12 VAC [id number]  '[]'  [facility]) staff shall perform screenings of the [facility] clients.    Advanced Care Plan:  '[]' Surrogate Decision Maker of Care  '[]' POA  '[x]' Communicated Code Status and copy sent.FULL Code     Other:       Lorenda Cahill  Case Manager

## 2021-10-05 NOTE — Progress Notes (Signed)
[facility]  [address] [place], [place] [address]  [phone]        Hospitalist Progress Note      NAME: Deborah [name]   DOB:  [birth year]  MRM:  [id number]    Date/Time of service: 10/05/2021  12:27 PM       Subjective:     Chief Complaint:  Patient was personally seen and examined by me during this time period.  Chart reviewed.  Back pain is improving.  Awaiting placement        Objective:       Vitals:       Last 24hrs VS reviewed since prior progress note. Most recent are:    Vitals:    10/05/21 1207   BP: 124/72   Pulse:    Resp:    Temp:    SpO2:      SpO2 Readings from Last 6 Encounters:   10/05/21 98%   09/27/21 96%   08/30/21 96%   08/19/21 94%   08/14/21 96%   08/09/21 95%          Intake/Output Summary (Last 24 hours) at 10/05/2021 1227  Last data filed at 10/05/2021 0446  Gross per 24 hour   Intake 0 ml   Output 1950 ml   Net -1950 ml          Exam:     Physical Exam:    Gen:  Well-developed, well-nourished, morbid obesity, in no acute distress  HEENT:  Pink conjunctivae, PERRL, hearing intact to voice, moist mucous membranes  Neck:  Supple, without masses, thyroid non-tender  Resp:  no accessory muscle use, clear breath sounds without wheezes rales or rhonchi  Card:  2/6 murmurs, normal S1, S2 without thrills, trace edema  Abd:  Soft, non-tender, non-distended, normoactive bowel sounds are present  Musc:  No cyanosis or clubbing  Skin:  No rashes  Neuro:  Cranial nerves 3-12 are grossly intact, follows commands appropriately  Psych:  Good insight, oriented to person, place and time, alert    Medications Reviewed: (see below)    Lab Data Reviewed: (see below)    ______________________________________________________________________    Medications:     Current Facility-Administered Medications   Medication Dose Route Frequency    bumetanide (BUMEX) tablet 1 mg  1 mg Oral Daily    pantoprazole (PROTONIX) tablet 40 mg  40 mg Oral BID AC    amiodarone (CORDARONE) tablet 200 mg   200 mg Oral Daily    atorvastatin (LIPITOR) tablet 10 mg  10 mg Oral Daily    citalopram (CELEXA) tablet 40 mg  40 mg Oral Daily    fluticasone (FLONASE) 50 MCG/ACT nasal spray 2 spray  2 spray Nasal Daily PRN    gabapentin (NEURONTIN) capsule 100 mg  100 mg Oral TID    levothyroxine (SYNTHROID) tablet 150 mcg  150 mcg Oral QAM AC    miconazole (MICOTIN) 2 % powder   Topical PRN    midodrine (PROAMATINE) tablet 10 mg  10 mg Oral TID    montelukast (SINGULAIR) tablet 10 mg  10 mg Oral Nightly    polyethylene glycol (GLYCOLAX) packet 17 g  17 g Oral Daily PRN    acidophilus probiotic capsule 1 capsule  1 capsule Oral Daily    sucralfate (CARAFATE) tablet 1 g  1 g Oral TID    traMADol (ULTRAM) tablet 50 mg  50 mg Oral Q6H PRN    traZODone (DESYREL) tablet  100 mg  100 mg Oral Nightly    glucose chewable tablet 16 g  4 tablet Oral PRN    dextrose bolus 10% 125 mL  125 mL IntraVENous PRN    Or    dextrose bolus 10% 250 mL  250 mL IntraVENous PRN    glucagon injection 1 mg  1 mg SubCUTAneous PRN    dextrose 10 % infusion   IntraVENous Continuous PRN    potassium chloride (KLOR-CON) extended release tablet 20 mEq  20 mEq Oral Daily    HYDROmorphone (DILAUDID) injection 0.5 mg  0.5 mg IntraVENous Q4H PRN    insulin lispro (HUMALOG) injection vial 0-8 Units  0-8 Units SubCUTAneous TID WC    insulin lispro (HUMALOG) injection vial 0-4 Units  0-4 Units SubCUTAneous Nightly    glipiZIDE (GLUCOTROL) tablet 5 mg  5 mg Oral QAM AC    cyclobenzaprine (FLEXERIL) tablet 5 mg  5 mg Oral TID PRN    diclofenac sodium (VOLTAREN) 1 % gel 2 g  2 g Topical BID    sodium chloride flush 0.9 % injection 5-40 mL  5-40 mL IntraVENous 2 times per day    sodium chloride flush 0.9 % injection 5-40 mL  5-40 mL IntraVENous PRN    0.9 % sodium chloride infusion   IntraVENous PRN    enoxaparin Sodium (LOVENOX) injection 30 mg  30 mg SubCUTAneous BID    ondansetron (ZOFRAN-ODT) disintegrating tablet 4 mg  4 mg Oral Q8H PRN    Or    ondansetron (ZOFRAN)  injection 4 mg  4 mg IntraVENous Q6H PRN    acetaminophen (TYLENOL) tablet 650 mg  650 mg Oral Q6H PRN    Or    acetaminophen (TYLENOL) suppository 650 mg  650 mg Rectal Q6H PRN          Lab Review:     Recent Labs     10/03/21  0117 10/05/21  0103   WBC 5.7 5.4   HGB 9.0* 9.1*   HCT 29.5* 30.2*   PLT 97* 114*       Recent Labs     10/03/21  0117 10/05/21  0103   NA 136 137   K 3.7 3.6   CL 100 101   CO2 32 32   BUN 21* 24*   MG 1.7 1.6   PHOS 3.3 3.5       No results found for: GLUCPOC       Assessment / Plan:     66 yo hx of HTN, DM, dCHF, PVCs s/p recent ablation, pacemaker, bioprosthetic AVR, OSA on 2L O2, presented w/ dizziness, recurrent falls, back pain, severe lumbar stenosis    1) Dizziness/orthostatic hypotension/falls: now resolved.  Unclear etiology.  Could be due to recent cardiac ablation vs chronic pain.  Cont midodrine.  Cards stopped Mexitil     2) Chronic dCHF/bioprosthetic AVR: volume stable.  Cont bumex    3) PVCs: s/p recent ablation.  Has pacemaker.  Cont amio.  Cards stopped Mexitil     4) Acute on chronic back pain/severe cervical and lumbar stenosis: CT with severe spinal stenosis at C6-C7 and L1-L2.  No further interventions per Ortho.  Will use IV dilaudid prn severe pain. PT/OT following, awaiting SNF    5) DM type 2: A1C 5.3%.  Cont glipizide, SSI    6) CKD 3: Cr stable, monitor     7) Chronic resp failure/OSA: cont bipap, 2L home O2 at night  8) Anemia: has low iron level. S/p IV iron, monitor Hgb     **Prior records, notes, labs, radiology, and medications reviewed in [facility]**    Total time spent with patient care: 30 Minutes **I personally saw and examined the patient during this time period**                 Care Plan discussed with: Patient, nursing, CM     Discussed:  Care Plan    Prophylaxis:  Lovenox    Disposition:  SNF/LTC           ___________________________________________________    Attending Physician: Paulo Fruit, MD

## 2021-10-05 NOTE — Consults (Signed)
Pt seen and treated, see progress notes for plan of care

## 2021-10-05 NOTE — Discharge Summary (Signed)
[facility]  [address] [place], [place] [address]  [phone]          Hospitalist Discharge Summary     Patient ID:  Deborah [name]  [id number]  65 y.o.  11/03/1955    Admit date: 10/01/2021    Discharge date and time: 10/05/2021 2:06 PM    Admission Diagnoses: Hypomagnesemia [E83.42]  Recurrent falls [R29.6]  Acute exacerbation of chronic low back pain [M54.50, G89.29]  Fall, initial encounter [W19.XXXA]    Discharge Diagnoses:  Principal Diagnosis Lumbar spinal stenosis                                            Principal Problem:    Lumbar spinal stenosis  Active Problems:    Recurrent falls    Cervical spinal stenosis  Resolved Problems:    * No resolved hospital problems. *         Hospital Course:     66 yo hx of HTN, DM, dCHF, PVCs s/p recent ablation, pacemaker, bioprosthetic AVR, OSA on 2L O2, presented w/ dizziness, recurrent falls, back pain, severe lumbar stenosis     1) Dizziness/orthostatic hypotension/falls: now resolved.  Unclear etiology.  Could be due to recent cardiac ablation vs chronic pain.  Cont midodrine.  Cards stopped Mexitil      2) Chronic dCHF/bioprosthetic AVR: volume stable.  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  Cont amio.  Cards stopped Mexitil      4) Acute on chronic back pain/severe cervical and lumbar stenosis: CT with severe spinal stenosis at C6-C7 and L1-L2.  No further interventions per Ortho.  Cont PT/OT at SNF      5) DM type 2: A1C 5.3%.  Cont glipizide, SSI     6) CKD 3: Cr stable, monitor      7) Chronic resp failure/OSA: cont bipap, 2L home O2 at night      8) Anemia: has low iron level. S/p IV iron, Hgb stable     PCP: Lance Sell Barrett, APRN - NP     Consults: Cards, Ortho     Significant Diagnostic Studies: none    Discharge Exam:  Physical Exam:    See daily note     Disposition: SNF  Discharge Condition: Stable    Patient Instructions:   Current Discharge Medication List        START taking these  medications    Details   cyclobenzaprine (FLEXERIL) 5 MG tablet Take 1 tablet by mouth 3 times daily as needed for Muscle spasms  Qty: 30 tablet, Refills: 0           CONTINUE these medications which have CHANGED    Details   traMADol (ULTRAM) 50 MG tablet Take 1 tablet by mouth every 6 hours as needed for Pain for up to 7 days. Max Daily Amount: 200 mg  Qty: 20 tablet, Refills: 0    Comments: Reduce doses taken as pain becomes manageable  Associated Diagnoses: Spinal stenosis of lumbar region without neurogenic claudication      gabapentin (NEURONTIN) 100 MG capsule Take 1 capsule by mouth every 8 hours for 30 days. Max Daily Amount: 300 mg  Qty: 90 capsule, Refills: 0    Associated Diagnoses: Anasarca      bumetanide (BUMEX) 1 MG  tablet Take 1 tablet by mouth daily  Qty: 30 tablet, Refills: 0           CONTINUE these medications which have NOT CHANGED    Details   amiodarone (CORDARONE) 200 MG tablet Take 1 tablet by mouth daily      glipiZIDE (GLUCOTROL XL) 5 MG extended release tablet Take 1 tablet by mouth daily      omeprazole (PRILOSEC) 40 MG delayed release capsule Take 1 capsule by mouth daily      Probiotic Product (ACIDOPHILUS PROBIOTIC) CAPS capsule Take 1 capsule by mouth daily  Qty: 30 capsule, Refills: 1      potassium chloride (KLOR-CON M) 20 MEQ extended release tablet Take 1 tablet by mouth daily  Qty: 30 tablet, Refills: 1      midodrine (PROAMATINE) 10 MG tablet Take 1 tablet by mouth 3 times daily  Qty: 90 tablet, Refills: 1      albuterol sulfate HFA (PROVENTIL;VENTOLIN;PROAIR) 108 (90 Base) MCG/ACT inhaler Inhale 2 puffs into the lungs every 6 hours as needed      aspirin 81 MG chewable tablet Take 1 tablet by mouth daily      atorvastatin (LIPITOR) 10 MG tablet Take 1 tablet by mouth daily      citalopram (CELEXA) 40 MG tablet Take 1 tablet by mouth daily      fluticasone (FLONASE) 50 MCG/ACT nasal spray 2 sprays by Nasal route daily as needed      levothyroxine (SYNTHROID) 150 MCG tablet  Take 1 tablet by mouth every morning (before breakfast)      miconazole (MICOTIN) 2 % powder Apply topically as needed      montelukast (SINGULAIR) 10 MG tablet Take 1 tablet by mouth nightly      polyethylene glycol (GLYCOLAX) 17 GM/SCOOP powder Take 17 g by mouth daily as needed      sucralfate (CARAFATE) 1 GM tablet Take 1 tablet by mouth 3 times daily      traZODone (DESYREL) 100 MG tablet Take 1 tablet by mouth nightly           STOP taking these medications       mexiletine (MEXITIL) 150 MG capsule Comments:   Reason for Stopping:         diclofenac (VOLTAREN) 50 MG EC tablet Comments:   Reason for Stopping:         glipiZIDE (GLUCOTROL) 5 MG tablet Comments:   Reason for Stopping:             Activity: activity as tolerated  Diet: cardiac diet  Wound Care: none needed    Follow-up with  [facility] [address] [place])  [address]  [place] [address]  [phone]        [name], APRN - NP  [address]  [place] [address]  [phone]    Schedule an appointment as soon as possible for a visit in 1 week(s)        Follow-up tests/labs none    Signed:  Paulo Fruit, MD  10/05/2021  2:06 PM  **I personally spent 35 min on discharge**

## 2021-10-05 NOTE — Care Coordination-Inpatient (Signed)
10/05/2021  10:05 AM  Pt emergently admitted 10/01/21 for dizziness/orthostatic hypotension/falls, update via chart review is continuing to require medical management for Dizziness/orthostatic hypotension/falls, Chronic dCHF/bioprosthetic AVR, PVCs, T2DM, CKD 3, Chronic respiratory failure/OSA, anemia  Transitions of Care Plan:  RUR 20 % High Risk of Readmission/Red  LOS 4 Days  Medical management continues   Cardiology following  PT/OT following, the recommendation is for SNF, pt accepted [facility]  CM to follow through for treatment/response, [facility] Medicare auth pending, submitted 9/7, CM spoke w/ [facility] admissions, they will admit pt over wknd if auth obtained   CM spoke w/ son Makinzie Considine updated on DC plan, family is able to transport to SNF  DC when stable to [facility] SN[facility]  Outpatient follow up at DC from SNF, PCP, cardiology  Family will transport at DC  CM will continue to follow and assist w/ DC needs  Harriette Bouillon  Case Manager

## 2021-10-05 NOTE — Progress Notes (Signed)
Orthopedic NP Progress Note  Post Op Day: * No surgery found *    October 05, 2021 2:59 PM     Deborah [name]    Attending Physician: Treatment Team: Attending Provider: Dayna Barker, MD; Consulting Physician: Fredderick Severance, PA; Utilization Reviewer: Ainsley Spinner, RN; Case Manager: Herold Harms, RN; Case Manager: Tomi Likens; Advanced Practice Nurse: Gus Puma, APRN - NP; Nursing Student: Blanchard Mane; Occupational Therapist: Madolyn Frieze, OT; Physical Therapist: Kelvin Cellar, PT     Vital Signs:    Patient Vitals for the past 8 hrs:   BP Temp Temp src Pulse Resp SpO2 Height   10/05/21 1409 -- -- -- -- -- -- 1.702 m (5\' 7" )   10/05/21 1207 124/72 -- -- -- -- -- --   10/05/21 1159 (!) 68/54 99.1 F (37.3 C) Oral 75 16 98 % --   10/05/21 0836 (!) 117/56 99 F (37.2 C) Oral 77 16 94 % --          Intake/Output:  No intake/output data recorded.  09/06 1901 - 09/08 0700  In: 1100 [P.O.:1100]  Out: 1950 [Urine:1950]    Pain Control:        LAB:    Recent Labs     10/05/21  0103   HCT 30.2*   HGB 9.1*     Lab Results   Component Value Date/Time    NA 137 10/05/2021 01:03 AM    K 3.6 10/05/2021 01:03 AM    CL 101 10/05/2021 01:03 AM    CO2 32 10/05/2021 01:03 AM    BUN 24 10/05/2021 01:03 AM       Subjective:  Deborah [name] is a 66 y.o. female with acute on chronic back pain, up in chair, pain min improved, no numbness/tingling    . Tolerating diet.       Objective: General: alert, cooperative, no distress.    Neuro/Vascular: CNS Intact.  Sensation stable. Brisk cap refill, 2+ pulses UE/LE  Musculoskeletal:    + ROM of UE/LE, 5/5 strength in bilat LE, +Df/PF, NVI   Skin: Incision - clean, dry and intact. No significant erythema or swelling.               PT/OT:   Gait:                      Assessment:    s/p     Principal Problem:    Lumbar spinal stenosis  Active Problems:    Recurrent falls    Cervical spinal stenosis  Resolved Problems:    * No resolved hospital problems. *       Plan:    Lumbar stenosis and Cervical Stenosis, Lumbago - pain is min improved, suggested she try the flexeril for improved pain control, pt was seen by [facility], recommendations to continue pain management with follow up as an outpt with [facility] spine and/or pain management  - ortho to sign off, call with any new concerns      Signed By: 77, APRN - NP    Orthopedic Physician Assistant

## 2021-10-05 NOTE — Progress Notes (Signed)
Physician Progress Note      PATIENT:               Deborah [name], Deborah [name]  CSN #:                  [id number]  DOB:                       Dec 11, 1955  ADMIT DATE:       10/01/2021 6:47 PM  DISCH DATE:        10/05/2021 5:45 PM  RESPONDING  PROVIDER #:        Dayna Barker MD          QUERY TEXT:    Pt admitted with dizziness. H&P documented "Dizziness causing recurrent falls   since cardiac PVC ablation 08/30/21".    Please document in progress notes and discharge summary the etiology of the   dizziness (reason for admission).      The medical record reflects the following:    Risk Factors: s/p recent ablation, anemia, chronic back pain/severe cervical   and lumbar stenosis    Clinical Indicators:  ED pn:  She reports recent A-fib ablation with Dr. Loma Newton at [facility] but since   that time she has been having episodes of lightheadedness and falls which   come on very rapidly without chest pain or pressure or shortness of breath.  -Recent A-fib ablation, concern for cardiogenic etiology    Hgb 9.6 on admission, 9.1 on discharge    BP readings:  9/5 1100 74/54 semi fowlers  9/5 1104 118/56 semi fowlers  9/5 1109 105/54 sitting    9/5 1554 122/52 supine - MAP 70  9/5 1556 102/79 sitting - MAP 85  9/5 1559 89/53 standing  - MAP 63    CT head: No acute intracranial abnormality.  CT spine with severe spinal stenosis at C6-C7 and L1-L2    9/5 Dr Loma Newton cardio pn: She is anemic and dizzy with low bp  Need midodrine 5-10 mg po tid  Stop mexiletine    Treatment: Ct head and spine, orthostatic BP assessment, CBC and BMP   monitoring, Iron Sucrose 200 mg IV (on 9/7 and 9/8), Stop mexitil, midodrine   5-10 mg po tid    Thank you,  Wayna Chalet, RN, CDI, CRCR  [email or web address]  Options provided:  -- Dizziness due to recent ablation  -- Dizziness due to orthostatic hypotension  -- Dizziness due to anemia  -- Dizziness due to Acute on chronic back pain/severe cervical and lumbar   stenosis  -- Dizziness due to, Please document etiology.  -- Other  - I will add my own diagnosis  -- Disagree - Not applicable / Not valid  -- Disagree - Clinically unable to determine / Unknown  -- Refer to Clinical Documentation Reviewer    PROVIDER RESPONSE TEXT:    This patient has dizziness due to recent ablation.    Query created by: Wayna Chalet on 10/09/2021 9:42 AM      Electronically signed by:  Dayna Barker MD 10/09/2021 6:51 PM

## 2021-10-10 ENCOUNTER — Encounter: Payer: MEDICARE | Attending: Student in an Organized Health Care Education/Training Program | Primary: Family

## 2021-10-10 NOTE — Telephone Encounter (Signed)
-----   Message from Bing Matter, APRN - NP sent at 09/28/2021 11:27 AM EDT -----  Mildly anemic, but Hgb is stable compared to 09/17/2021, has actually mildly improved from 9.2 at that time.  Chronically thrombocytopenic but improved.  No change in treatment plan based on results.    Future Appointments  10/10/2021  11:40 AM   Lorain Childes, DO           [facility]               BS AMB  12/27/2021 1:20 PM    PACEMAKER, STFRANCES       [facility]               BS AMB  03/13/2022  2:00 PM    PACEMAKER, STFRANCES       [facility]               BS AMB  03/13/2022  2:20 PM    Shelle Iron, MD            [facility]               BS AMB

## 2021-11-05 ENCOUNTER — Inpatient Hospital Stay: Payer: MEDICARE | Attending: Clinical Cardiac Electrophysiology | Primary: Family

## 2021-11-05 LAB — EXTENDED CARDIAC HOLTER MONITOR: Body Surface Area: 2.51 m2

## 2021-11-05 NOTE — Progress Notes (Signed)
11:00 Am   Attempted to call patient in regards to 12:00 PM TEE with 10:30 AM arrival. No answer, phone rang straight to voicemail.

## 2021-11-05 NOTE — Telephone Encounter (Signed)
-----   Message from Shelle Iron, MD sent at 11/05/2021  7:07 AM EDT -----    Holter:  PVC(s): Burden was 5.3 %, 7955 total PVC(s), 1 disparate morphologies  Ventricular Tachycardia: 5 events, longest event 6 beats at Day 4 / 12:39:26 am, fastest event 139 bpm at Day 3 /  08:56:13 pm     This is before mexiletine  PVC used to be 30K before ablation      I do not think more needed for PVC    Future Appointments  11/05/2021  12:00 PM   [facility]                 [facility]              [facility]  12/27/2021 1:20 PM    PACEMAKER, STFRANCES       [facility]               BS AMB  03/13/2022  2:00 PM    PACEMAKER, STFRANCES       [facility]               BS AMB  03/13/2022  2:20 PM    Shelle Iron, MD           [facility]               BS AMB

## 2021-11-05 NOTE — Progress Notes (Signed)
No show for TEE today

## 2021-11-07 ENCOUNTER — Encounter

## 2021-11-11 DIAGNOSIS — Z950 Presence of cardiac pacemaker: Secondary | ICD-10-CM

## 2021-11-18 ENCOUNTER — Inpatient Hospital Stay
Admit: 2021-11-18 | Discharge: 2021-11-18 | Disposition: A | Discharge status: Home / Self Care | Payer: MEDICARE | Attending: Emergency Medicine

## 2021-11-18 ENCOUNTER — Emergency Department: Admit: 2021-11-18 | Payer: MEDICARE | Primary: Family

## 2021-11-18 DIAGNOSIS — R109 Unspecified abdominal pain: Secondary | ICD-10-CM

## 2021-11-18 LAB — URINALYSIS WITH MICROSCOPIC
Bilirubin Urine: NEGATIVE
Blood, Urine: NEGATIVE
Glucose, UA: NEGATIVE mg/dL
Ketones, Urine: NEGATIVE mg/dL
Nitrite, Urine: NEGATIVE
Protein, UA: NEGATIVE mg/dL
Specific Gravity, UA: 1.021 (ref 1.003–1.030)
Urobilinogen, Urine: 1 EU/dL (ref 0.2–1.0)
pH, Urine: 6.5 (ref 5.0–8.0)

## 2021-11-18 LAB — EXTRA TUBES HOLD

## 2021-11-18 MED ORDER — TRAMADOL HCL 50 MG PO TABS
50 MG | ORAL | Status: AC
Start: 2021-11-18 — End: 2021-11-18
  Administered 2021-11-18: 14:00:00 50 mg via ORAL

## 2021-11-18 MED ORDER — TRAMADOL HCL 50 MG PO TABS
50 MG | ORAL_TABLET | ORAL | 0 refills | Status: AC | PRN
Start: 2021-11-18 — End: 2021-11-21

## 2021-11-18 MED ORDER — ONDANSETRON 4 MG PO TBDP
4 MG | ORAL | Status: AC
Start: 2021-11-18 — End: 2021-11-18
  Administered 2021-11-18: 14:00:00 4 mg via ORAL

## 2021-11-18 NOTE — ED Triage Notes (Addendum)
Pt arrives to the ER for complaints of right lower back pain that started last night. Denies any radiating pain.     Pt was admitted recently for back pain and went to rehab. Pt states that while she was in rehab she had a fall, reports that she left rehab about 30 days ago.        Pt states that she does have a history of kidney stones.      Denies any changes in urination, fevers, hematuria.

## 2021-11-18 NOTE — ED Provider Notes (Signed)
[facility] EMERGENCY DEPT  EMERGENCY DEPARTMENT ENCOUNTER      Pt Name: Deborah [name]  MRN: [id number]  Birthdate 1955/08/26  Date of evaluation: 11/18/2021  Provider: Yetta Numbers, APRN - NP    CHIEF COMPLAINT       Chief Complaint   Patient presents with    Back Pain         HISTORY OF PRESENT ILLNESS   (Location/Symptom, Timing/Onset, Context/Setting, Quality, Duration, Modifying Factors, Severity)  Note limiting factors.   HPI  Patient is a 66 year old female with an extensive past medical history please see list below who presents to the ED with right flank area pain since yesterday.  She states she fell over 30 days ago while in rehab and had an x-ray which did not show any acute changes.  She states the pain feels similar to previous kidney stone.Denies any new falls,blunt trauma or injury. Denies fever, rash, abdominal pain or urinary symptoms.  Denies any saddle anesthesia or changes in bowel or bladder habits.  Pain increases with bending, walking and position changes. She has not had any pain medications today prior to arrival  Old charts reviewed.    Review of External Medical Records:     Nursing Notes were reviewed.    REVIEW OF SYSTEMS    (2-9 systems for level 4, 10 or more for level 5)     Review of Systems   Constitutional:  Negative for activity change, appetite change, fever and unexpected weight change.   HENT:  Negative for congestion and trouble swallowing.    Eyes:  Negative for visual disturbance.   Respiratory:  Negative for cough and shortness of breath.    Cardiovascular:  Negative for chest pain, palpitations and leg swelling.   Gastrointestinal:  Negative for abdominal pain, nausea and vomiting.   Genitourinary:  Positive for flank pain. Negative for dysuria.   Musculoskeletal:  Positive for back pain.   Skin:  Negative for rash.   Neurological:  Negative for dizziness and headaches.   All other systems reviewed and are negative.      Except as noted above the remainder of the review of  systems was reviewed and negative.       PAST MEDICAL HISTORY     Past Medical History:   Diagnosis Date    (HFpEF) heart failure with preserved ejection fraction (HCC)     Anxiety and depression     Aortic valve replaced     S/p bovine aortic valve replacement.    Asthma     Chronic narcotic use     Chronic obstructive pulmonary disease (HCC)     Chronic pain     CKD (chronic kidney disease), stage III (HCC)     Baseline creatinine is 1.3-1.4 with GFR in the 40s.    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)     GERD (gastroesophageal reflux disease)     History of vascular access device 04/13/2021    4 FR Single PICC for LTABX: R cephalic vessell length 48 CM Max P leave @ 1 CM out; Arm circumferenc 40 CM    Hyperlipidemia     Hypothyroidism     Morbid obesity (HCC)     Neuropathy     Obstructive sleep apnea     Rhinitis          SURGICAL HISTORY       Past Surgical History:   Procedure Laterality Date    AORTIC VALVE REPLACEMENT  Bovine bioprosthetic    CARDIAC PROCEDURE N/A 08/30/2021    Intracardiac echocardiogram performed by Thurston Pounds, MD at [facility] CARDIAC CATH LAB    COLONOSCOPY N/A 12/01/2020    COLONOSCOPY performed by Ian Malkin, MD at [facility] ENDOSCOPY    EP DEVICE PROCEDURE N/A 08/30/2021    Ablation PVC performed by Thurston Pounds, MD at [facility] CARDIAC CATH LAB    EP DEVICE PROCEDURE N/A 08/30/2021    Drug stimulation performed by Thurston Pounds, MD at [facility] CARDIAC CATH LAB    EP DEVICE PROCEDURE N/A 08/30/2021    Transeptal puncture performed by Thurston Pounds, MD at [facility] CARDIAC CATH LAB    EP DEVICE PROCEDURE N/A 08/30/2021    Ep 3d mapping performed by Thurston Pounds, MD at [facility] CARDIAC CATH LAB    INVASIVE VASCULAR N/A 08/30/2021    Ultrasound guided vascular access performed by Thurston Pounds, MD at [facility] CARDIAC CATH LAB    PACEMAKER           CURRENT MEDICATIONS       Discharge Medication List as of 11/18/2021 11:03 AM        CONTINUE these medications which have NOT CHANGED    Details   gabapentin (NEURONTIN) 100 MG capsule Take 1  capsule by mouth every 8 hours for 30 days. Max Daily Amount: 300 mg, Disp-90 capsule, R-0Print      bumetanide (BUMEX) 1 MG tablet Take 1 tablet by mouth daily, Disp-30 tablet, R-0NO PRINT      amiodarone (CORDARONE) 200 MG tablet Take 1 tablet by mouth dailyHistorical Med      glipiZIDE (GLUCOTROL XL) 5 MG extended release tablet Take 1 tablet by mouth dailyHistorical Med      omeprazole (PRILOSEC) 40 MG delayed release capsule Take 1 capsule by mouth dailyHistorical Med      Probiotic Product (ACIDOPHILUS PROBIOTIC) CAPS capsule Take 1 capsule by mouth daily, Disp-30 capsule, R-1Normal      potassium chloride (KLOR-CON M) 20 MEQ extended release tablet Take 1 tablet by mouth daily, Disp-30 tablet, R-1Normal      midodrine (PROAMATINE) 10 MG tablet Take 1 tablet by mouth 3 times daily, Disp-90 tablet, R-1Normal      albuterol sulfate HFA (PROVENTIL;VENTOLIN;PROAIR) 108 (90 Base) MCG/ACT inhaler Inhale 2 puffs into the lungs every 6 hours as neededHistorical Med      aspirin 81 MG chewable tablet Take 1 tablet by mouth dailyHistorical Med      atorvastatin (LIPITOR) 10 MG tablet Take 1 tablet by mouth dailyHistorical Med      citalopram (CELEXA) 40 MG tablet Take 1 tablet by mouth dailyHistorical Med      fluticasone (FLONASE) 50 MCG/ACT nasal spray 2 sprays by Nasal route daily as neededHistorical Med      levothyroxine (SYNTHROID) 150 MCG tablet Take 1 tablet by mouth every morning (before breakfast)Historical Med      miconazole (MICOTIN) 2 % powder Apply topically as needed, Topical, PRN Starting Tue 04/24/2021, Historical Med      montelukast (SINGULAIR) 10 MG tablet Take 1 tablet by mouth nightlyHistorical Med      polyethylene glycol (GLYCOLAX) 17 GM/SCOOP powder Take 17 g by mouth daily as neededHistorical Med      sucralfate (CARAFATE) 1 GM tablet Take 1 tablet by mouth 3 times dailyHistorical Med      traZODone (DESYREL) 100 MG tablet Take 1 tablet by mouth nightlyHistorical Med             ALLERGIES  Nitroglycerin, Hydrochlorothiazide, Tetanus toxoids, and Aloe vera    FAMILY HISTORY       Family History   Problem Relation Age of Onset    Hypertension Mother     Hypertension Father           SOCIAL HISTORY       Social History     Socioeconomic History    Marital status: Widowed   Tobacco Use    Smoking status: Former     Packs/day: 1     Types: Cigarettes     Quit date: 03/21/2010     Years since quitting: 11.6    Smokeless tobacco: Never   Vaping Use    Vaping Use: Never used   Substance and Sexual Activity    Alcohol use: Not Currently    Drug use: Never           PHYSICAL EXAM    (up to 7 for level 4, 8 or more for level 5)     ED Triage Vitals [11/18/21 0946]   BP Temp Temp Source Pulse Respirations SpO2 Height Weight - Scale   132/61 98 F (36.7 C) Oral 70 16 96 % 1.702 m (5\' 7" ) 133.8 kg (295 lb)       Body mass index is 46.2 kg/m.    Physical Exam  Vitals and nursing note reviewed.   Constitutional:       General: She is not in acute distress.     Appearance: Normal appearance. She is obese. She is not ill-appearing, toxic-appearing or diaphoretic.      Comments: Morbidly obese female; former smoker   HENT:      Head: Normocephalic.   Cardiovascular:      Rate and Rhythm: Normal rate and regular rhythm.   Pulmonary:      Effort: Pulmonary effort is normal.      Breath sounds: Normal breath sounds.   Musculoskeletal:         General: Tenderness present.      Cervical back: Normal range of motion.      Comments: Reports right flank pain;Skin integrity is intact. There is no obvious bony or soft tissue deformity; no rash, bruising or erythema. Good neurovascular sensation. No apparent tendon or nerve injury.      Neurological:      Mental Status: She is alert.         DIAGNOSTIC RESULTS     EKG: All EKG's are interpreted by the Emergency Department Physician who either signs or Co-signs this chart in the absence of a cardiologist.        RADIOLOGY:   Non-plain film images such as CT, Ultrasound and MRI  are read by the radiologist. Plain radiographic images are visualized and preliminarily interpreted by the emergency physician with the below findings:        Interpretation per the Radiologist below, if available at the time of this note:    CT ABDOMEN PELVIS WO CONTRAST Additional Contrast? None   Final Result   Nonobstructing stones in the right renal pelvis and right kidney. No ureteral   stone or hydronephrosis.           LABS:  Labs Reviewed   URINALYSIS WITH MICROSCOPIC - Abnormal; Notable for the following components:       Result Value    Leukocyte Esterase, Urine SMALL (*)     Epithelial Cells UA MANY (*)     BACTERIA, URINE 1+ (*)  All other components within normal limits   EXTRA TUBES HOLD       All other labs were within normal range or not returned as of this dictation.    EMERGENCY DEPARTMENT COURSE and DIFFERENTIAL DIAGNOSIS/MDM:   Vitals:    Vitals:    11/18/21 0946   BP: 132/61   Pulse: 70   Resp: 16   Temp: 98 F (36.7 C)   TempSrc: Oral   SpO2: 96%   Weight: 133.8 kg (295 lb)   Height: 1.702 m (5\' 7" )           Medical Decision Making  Amount and/or Complexity of Data Reviewed  Labs: ordered.  Radiology: ordered.    Risk  Prescription drug management.            REASSESSMENT      Patient has been reexamined and denies any further complaints of pain or discomfort.  Recommend close follow-up with orthopedic back specialist and/or pain management specialist for further evaluation and treatment.  Patient prescribed short course of Ultram for symptom control.    1:22 PM  Patient's results  and plan of care have been reviewed with her.  Patient and/or family have verbally conveyed their understanding and agreement of the patient's signs, symptoms, diagnosis, treatment and prognosis and additionally agree to follow up as recommended or return to the Emergency Room should her condition change prior to follow-up.  Discharge instructions have also been provided to the patient with some educational  information regarding her diagnosis as well a list of reasons why she would want to return to the ER prior to her follow-up appointment should her condition change.        CONSULTS:  None    PROCEDURES:  Unless otherwise noted below, none     Procedures      FINAL IMPRESSION      1. Acute right flank pain          DISPOSITION/PLAN   DISPOSITION Decision To Discharge 11/18/2021 11:02:30 AM      PATIENT REFERRED TO:  Karilyn Cota, APRN - NP  [address]  [place] [address]  [phone]    In 2 days  As needed, If symptoms worsen    Barnetta Chapel, MD  [place]  [address]  [place] [address]  [phone]      As needed, If symptoms worsen    Mertha Finders, MD  [place] [address]  [phone]      As needed, If symptoms worsen      DISCHARGE MEDICATIONS:  Discharge Medication List as of 11/18/2021 11:03 AM        START taking these medications    Details   traMADol (ULTRAM) 50 MG tablet Take 1 tablet by mouth every 4 hours as needed for Pain for up to 3 days. Intended supply: 3 days. Take lowest dose possible to manage pain Max Daily Amount: 300 mg, Disp-18 tablet, R-0Normal               (Please note that portions of this note were completed with a voice recognition program.  Efforts were made to edit the dictations but occasionally words are mis-transcribed.)    Eben Burow, APRN - NP (electronically signed)  Emergency Attending Physician / Physician Assistant / Nurse Practitioner             Eben Burow, APRN - NP  11/18/21 1322

## 2021-11-26 NOTE — Telephone Encounter (Signed)
Per Ailene Ravel NP last office visit, patients amiodarone was discontinued. Medication not refilled at this time.

## 2021-12-14 ENCOUNTER — Emergency Department: Admit: 2021-12-14 | Payer: MEDICARE | Primary: Family

## 2021-12-14 ENCOUNTER — Inpatient Hospital Stay
Admit: 2021-12-14 | Discharge: 2021-12-15 | Disposition: A | Discharge status: Home / Self Care | Payer: MEDICARE | Attending: Emergency Medicine

## 2021-12-14 DIAGNOSIS — K921 Melena: Secondary | ICD-10-CM

## 2021-12-14 LAB — CBC WITH AUTO DIFFERENTIAL
Absolute Immature Granulocyte: 0 10*3/uL (ref 0.00–0.04)
Basophils %: 1 % (ref 0–1)
Basophils Absolute: 0 10*3/uL (ref 0.0–0.1)
Eosinophils %: 2 % (ref 0–7)
Eosinophils Absolute: 0.2 10*3/uL (ref 0.0–0.4)
Hematocrit: 32.9 % — ABNORMAL LOW (ref 35.0–47.0)
Hemoglobin: 9.9 g/dL — ABNORMAL LOW (ref 11.5–16.0)
Immature Granulocytes: 0 % (ref 0.0–0.5)
Lymphocytes %: 34 % (ref 12–49)
Lymphocytes Absolute: 2.4 10*3/uL (ref 0.8–3.5)
MCH: 24.6 PG — ABNORMAL LOW (ref 26.0–34.0)
MCHC: 30.1 g/dL (ref 30.0–36.5)
MCV: 81.8 FL (ref 80.0–99.0)
MPV: 11.4 FL (ref 8.9–12.9)
Monocytes %: 8 % (ref 5–13)
Monocytes Absolute: 0.5 10*3/uL (ref 0.0–1.0)
Neutrophils %: 55 % (ref 32–75)
Neutrophils Absolute: 3.9 10*3/uL (ref 1.8–8.0)
Nucleated RBCs: 0 PER 100 WBC
Platelets: 145 10*3/uL — ABNORMAL LOW (ref 150–400)
RBC: 4.02 M/uL (ref 3.80–5.20)
RDW: 15.2 % — ABNORMAL HIGH (ref 11.5–14.5)
WBC: 7 10*3/uL (ref 3.6–11.0)
nRBC: 0 10*3/uL (ref 0.00–0.01)

## 2021-12-14 LAB — EXTRA TUBES HOLD

## 2021-12-14 LAB — COMPREHENSIVE METABOLIC PANEL
ALT: 17 U/L (ref 12–78)
AST: 20 U/L (ref 15–37)
Albumin/Globulin Ratio: 0.8 — ABNORMAL LOW (ref 1.1–2.2)
Albumin: 3.3 g/dL — ABNORMAL LOW (ref 3.5–5.0)
Alk Phosphatase: 98 U/L (ref 45–117)
Anion Gap: 2 mmol/L — ABNORMAL LOW (ref 5–15)
BUN: 19 MG/DL (ref 6–20)
Bun/Cre Ratio: 14 (ref 12–20)
CO2: 34 mmol/L — ABNORMAL HIGH (ref 21–32)
Calcium: 8.9 MG/DL (ref 8.5–10.1)
Chloride: 103 mmol/L (ref 97–108)
Creatinine: 1.36 MG/DL — ABNORMAL HIGH (ref 0.55–1.02)
Est, Glom Filt Rate: 43 mL/min/{1.73_m2} — ABNORMAL LOW (ref >60)
Globulin: 4.1 g/dL — ABNORMAL HIGH (ref 2.0–4.0)
Glucose: 109 mg/dL — ABNORMAL HIGH (ref 65–100)
Potassium: 4.1 mmol/L (ref 3.5–5.1)
Sodium: 139 mmol/L (ref 136–145)
Total Bilirubin: 1.7 MG/DL — ABNORMAL HIGH (ref 0.2–1.0)
Total Protein: 7.4 g/dL (ref 6.4–8.2)

## 2021-12-14 LAB — TROPONIN: Troponin, High Sensitivity: 8 ng/L (ref 0–51)

## 2021-12-14 LAB — MAGNESIUM: Magnesium: 1.9 mg/dL (ref 1.6–2.4)

## 2021-12-14 LAB — LIPASE: Lipase: 87 U/L — ABNORMAL HIGH (ref 13–75)

## 2021-12-14 MED ORDER — IOPAMIDOL 76 % IV SOLN
76 % | Freq: Once | INTRAVENOUS | Status: AC | PRN
Start: 2021-12-14 — End: 2021-12-14
  Administered 2021-12-15: 100 mL via INTRAVENOUS

## 2021-12-14 MED FILL — ISOVUE-370 76 % IV SOLN: 76 % | INTRAVENOUS | Qty: 100

## 2021-12-14 NOTE — Discharge Instructions (Signed)
Thank you for allowing us to provide you with medical care today.  We realize that you have many choices for your emergency care needs.  We thank you for choosing [facility].  Please choose us in the future for any continued health care needs.     The exam and treatment you received in the Emergency Department were for an emergent problem and are not intended as complete care. It is important that you follow up with a doctor, nurse practitioner, or physician's assistant for ongoing care. If your symptoms worsen or you do not improve as expected and you are unable to reach your usual health care provider, you should return to the Emergency Department. We are available 24 hours a day.     Please make an appointment with your health care provider(s) for follow up of your Emergency Department visit.  Take this sheet with you when you go to your follow-up visit.

## 2021-12-14 NOTE — ED Triage Notes (Signed)
Pt arrives to the ER for complaints of bright and dark rectal bleeding, abdominal cramping, nausea, and right flank pain that started about a week ago. Pt states that about three months ago she had the same symptoms and states that she was diagnosed with ulcerative colitis. Pt denies receiving a colonoscopy.       Pt reports that she does feel fatigued and weak.     Denies any shortness of breath, vomiting.

## 2021-12-14 NOTE — ED Provider Notes (Signed)
[facility] EMERGENCY DEPT  EMERGENCY DEPARTMENT ENCOUNTER      Patient Name: Deborah [name]  MRN: [id number]  Birthdate 1955/12/13  Date of Evaluation: 12/14/2021  Physician: Harlen Labs, MD    CHIEF COMPLAINT       Chief Complaint   Patient presents with    GI Bleeding       HISTORY OF PRESENT ILLNESS   (Location/Symptom, Timing/Onset, Context/Setting, Quality, Duration, Modifying Factors, Severity)   Deborah [name], 66 y.o., female     66 year old female with a history of lower GI bleed presents with a chief complaint of hematochezia.  Patient was recently admitted to [facility] in [place] for GI bleed.  She is not on any blood thinners.  She denies other symptoms.          Nursing Notes were reviewed.    REVIEW OF SYSTEMS    (Not required)   Review of Systems    PAST MEDICAL HISTORY     Past Medical History:   Diagnosis Date    (HFpEF) heart failure with preserved ejection fraction (HCC)     Anxiety and depression     Aortic valve replaced     S/p bovine aortic valve replacement.    Asthma     Chronic narcotic use     Chronic obstructive pulmonary disease (HCC)     Chronic pain     CKD (chronic kidney disease), stage III (HCC)     Baseline creatinine is 1.3-1.4 with GFR in the 40s.    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)     GERD (gastroesophageal reflux disease)     History of vascular access device 04/13/2021    4 FR Single PICC for LTABX: R cephalic vessell length 48 CM Max P leave @ 1 CM out; Arm circumferenc 40 CM    Hyperlipidemia     Hypothyroidism     Morbid obesity (HCC)     Neuropathy     Obstructive sleep apnea     Rhinitis        SURGICAL HISTORY       Past Surgical History:   Procedure Laterality Date    AORTIC VALVE REPLACEMENT      Bovine bioprosthetic    CARDIAC PROCEDURE N/A 08/30/2021    Intracardiac echocardiogram performed by Thurston Pounds, MD at [facility] CARDIAC CATH LAB    COLONOSCOPY N/A 12/01/2020    COLONOSCOPY performed by Ian Malkin, MD at [facility] ENDOSCOPY    EP DEVICE PROCEDURE N/A 08/30/2021     Ablation PVC performed by Thurston Pounds, MD at [facility] CARDIAC CATH LAB    EP DEVICE PROCEDURE N/A 08/30/2021    Drug stimulation performed by Thurston Pounds, MD at [facility] CARDIAC CATH LAB    EP DEVICE PROCEDURE N/A 08/30/2021    Transeptal puncture performed by Thurston Pounds, MD at [facility] CARDIAC CATH LAB    EP DEVICE PROCEDURE N/A 08/30/2021    Ep 3d mapping performed by Thurston Pounds, MD at [facility] CARDIAC CATH LAB    INVASIVE VASCULAR N/A 08/30/2021    Ultrasound guided vascular access performed by Thurston Pounds, MD at [facility] CARDIAC CATH LAB    PACEMAKER         CURRENT MEDICATIONS       Previous Medications    ALBUTEROL SULFATE HFA (PROVENTIL;VENTOLIN;PROAIR) 108 (90 BASE) MCG/ACT INHALER    Inhale 2 puffs into the lungs every 6 hours as needed    AMIODARONE (CORDARONE) 200 MG TABLET    Take 1  tablet by mouth daily    ASPIRIN 81 MG CHEWABLE TABLET    Take 1 tablet by mouth daily    ATORVASTATIN (LIPITOR) 10 MG TABLET    Take 1 tablet by mouth daily    BUMETANIDE (BUMEX) 1 MG TABLET    Take 1 tablet by mouth daily    CITALOPRAM (CELEXA) 40 MG TABLET    Take 1 tablet by mouth daily    FLUTICASONE (FLONASE) 50 MCG/ACT NASAL SPRAY    2 sprays by Nasal route daily as needed    GABAPENTIN (NEURONTIN) 100 MG CAPSULE    Take 1 capsule by mouth every 8 hours for 30 days. Max Daily Amount: 300 mg    GLIPIZIDE (GLUCOTROL XL) 5 MG EXTENDED RELEASE TABLET    Take 1 tablet by mouth daily    LEVOTHYROXINE (SYNTHROID) 150 MCG TABLET    Take 1 tablet by mouth every morning (before breakfast)    MICONAZOLE (MICOTIN) 2 % POWDER    Apply topically as needed    MIDODRINE (PROAMATINE) 10 MG TABLET    Take 1 tablet by mouth 3 times daily    MONTELUKAST (SINGULAIR) 10 MG TABLET    Take 1 tablet by mouth nightly    OMEPRAZOLE (PRILOSEC) 40 MG DELAYED RELEASE CAPSULE    Take 1 capsule by mouth daily    POLYETHYLENE GLYCOL (GLYCOLAX) 17 GM/SCOOP POWDER    Take 17 g by mouth daily as needed    POTASSIUM CHLORIDE (KLOR-CON M) 20 MEQ EXTENDED RELEASE TABLET    Take 1 tablet  by mouth daily    PROBIOTIC PRODUCT (ACIDOPHILUS PROBIOTIC) CAPS CAPSULE    Take 1 capsule by mouth daily    SUCRALFATE (CARAFATE) 1 GM TABLET    Take 1 tablet by mouth 3 times daily    TRAZODONE (DESYREL) 100 MG TABLET    Take 1 tablet by mouth nightly       ALLERGIES     Nitroglycerin, Hydrochlorothiazide, Tetanus toxoids, and Aloe vera    FAMILY HISTORY       Family History   Problem Relation Age of Onset    Hypertension Mother     Hypertension Father         SOCIAL HISTORY       Social History     Socioeconomic History    Marital status: Widowed   Tobacco Use    Smoking status: Former     Packs/day: 1     Types: Cigarettes     Quit date: 03/21/2010     Years since quitting: 11.7    Smokeless tobacco: Never   Vaping Use    Vaping Use: Never used   Substance and Sexual Activity    Alcohol use: Not Currently    Drug use: Never       PHYSICAL EXAM     Vitals:    Vitals:    12/14/21 1717 12/14/21 1719   BP:  122/66   Pulse: 69    Resp: 16    Temp: 98.5 F (36.9 C)    TempSrc: Oral    SpO2:  95%   Weight: 132.9 kg (293 lb)    Height: 1.702 m (5\' 7" )        Physical Exam  Vitals and nursing note reviewed.   Constitutional:       General: She is not in acute distress.     Appearance: Normal appearance. She is not ill-appearing, toxic-appearing or diaphoretic.   HENT:  Head: Normocephalic and atraumatic.      Mouth/Throat:      Mouth: Mucous membranes are moist.   Eyes:      Extraocular Movements: Extraocular movements intact.   Cardiovascular:      Rate and Rhythm: Normal rate.   Pulmonary:      Effort: Pulmonary effort is normal.   Abdominal:      General: There is no distension.   Musculoskeletal:         General: Normal range of motion.      Cervical back: Normal range of motion.   Skin:     General: Skin is dry.   Neurological:      General: No focal deficit present.      Mental Status: She is alert.   Psychiatric:         Mood and Affect: Mood normal.         DIAGNOSTIC RESULTS     RADIOLOGY:   Non-plain film  images such as CT, Ultrasound and MRI are read by the radiologist. Plain radiographic images are visualized and preliminarily interpreted by the emergency physician with the below findings:        Interpretation per the Radiologist below, if available at the time of this note:    CTA [place]   Final Result   No acute abnormality. No evidence to suggest active GI bleeding. Nonobstructing   stones right kidney and right renal pelvis.      Billing note: The Facility order (procedure) was incorrect at the time of   interpretation and signature of this exam.? This discrepancy may have been   corrected after final signature.               ED BEDSIDE ULTRASOUND:   Performed by ED Physician    LABS:  Labs Reviewed   CBC WITH AUTO DIFFERENTIAL - Abnormal; Notable for the following components:       Result Value    Hemoglobin 9.9 (*)     Hematocrit 32.9 (*)     MCH 24.6 (*)     RDW 15.2 (*)     Platelets 145 (*)     All other components within normal limits   COMPREHENSIVE METABOLIC PANEL - Abnormal; Notable for the following components:    CO2 34 (*)     Anion Gap 2 (*)     Glucose 109 (*)     Creatinine 1.36 (*)     Est, Glom Filt Rate 43 (*)     Total Bilirubin 1.7 (*)     Albumin 3.3 (*)     Globulin 4.1 (*)     Albumin/Globulin Ratio 0.8 (*)     All other components within normal limits   LIPASE - Abnormal; Notable for the following components:    Lipase 87 (*)     All other components within normal limits   MAGNESIUM   TROPONIN   EXTRA TUBES HOLD   EXTRA TUBE, BLOOD BANK       All other labs were unremarkable or not returned as of this dictation.    EMERGENCY DEPARTMENT COURSE and DIFFERENTIAL DIAGNOSIS/MDM:   Medical Decision Making  Patient presents with lower GI bleed.  CT shows no active hemorrhage.  Hemoglobin 9.9.  Vital signs are normal.  Patient not on any blood thinners.  Recommended follow-up with GI as an outpatient.  Patient instructed to return to the hospital if the bleeding worsens or  she develops  any other symptoms.  Discussed my clinical impression(s), any labs and/or radiology results with the patient. I answered any questions and addressed any concerns. Discussed the importance of following up with their primary care physician and/or specialist(s). Discussed signs or symptoms that would warrant return back to the ER for further evaluation. The patient is agreeable with discharge.    Amount and/or Complexity of Data Reviewed  Labs: ordered.  Radiology: ordered.    Risk  Prescription drug management.         EKG: All EKG's are interpreted by the Emergency Department Physician who either signs or Co-signs this chart in the absence of a cardiologist.         CRITICAL CARE TIME   Total Critical Care time was 0 minutes, excluding separately reportable procedures.  There was a high probability of clinically significant/life threatening deterioration in the patient's condition which required my urgent intervention.     CONSULTS:  None    PROCEDURES:  Unless otherwise noted below, none     Procedures    FINAL IMPRESSION      1. Hematochezia          DISPOSITION/PLAN   DISPOSITION Decision To Discharge 12/14/2021 07:34:20 PM    PATIENT REFERRED TO:  [name], APRN - NP  [address]  [place] [address]  [phone]    Schedule an appointment as soon as possible for a visit       [facility] EMERGENCY DEPT  [address] [place] [address]  [phone]    As needed, If symptoms worsen    Leta Speller, MD  [address]  [place] [address]  [phone]    Schedule an appointment as soon as possible for a visit in 1 day        DISCHARGE MEDICATIONS:  New Prescriptions    No medications on file     Controlled Substances Monitoring:          No data to display                (Please note that portions of this note were completed with a voice recognition program.  Efforts were made to edit the dictations but occasionally words are mis-transcribed.)    Harlen Labs, MD (electronically signed)  Attending Emergency Physician            Norlene Campbell, MD  12/14/21 1946

## 2021-12-27 ENCOUNTER — Encounter: Payer: MEDICARE | Primary: Family

## 2021-12-27 ENCOUNTER — Ambulatory Visit: Admit: 2021-12-27 | Discharge: 2021-12-27 | Payer: MEDICARE | Primary: Family

## 2021-12-27 DIAGNOSIS — Z950 Presence of cardiac pacemaker: Secondary | ICD-10-CM

## 2022-01-14 ENCOUNTER — Inpatient Hospital Stay
Admit: 2022-01-14 | Discharge: 2022-01-15 | Disposition: A | Discharge status: Home / Self Care | Payer: MEDICARE | Attending: Emergency Medicine

## 2022-01-14 ENCOUNTER — Emergency Department: Admit: 2022-01-14 | Payer: MEDICARE | Primary: Family

## 2022-01-14 ENCOUNTER — Emergency Department: Admit: 2022-01-15 | Payer: MEDICARE | Primary: Family

## 2022-01-14 DIAGNOSIS — R079 Chest pain, unspecified: Secondary | ICD-10-CM

## 2022-01-14 LAB — TROPONIN: Troponin, High Sensitivity: 10 ng/L (ref 0–51)

## 2022-01-14 LAB — COMPREHENSIVE METABOLIC PANEL
ALT: 15 U/L (ref 12–78)
AST: 17 U/L (ref 15–37)
Albumin/Globulin Ratio: 0.8 — ABNORMAL LOW (ref 1.1–2.2)
Albumin: 3.4 g/dL — ABNORMAL LOW (ref 3.5–5.0)
Alk Phosphatase: 105 U/L (ref 45–117)
Anion Gap: 5 mmol/L (ref 5–15)
BUN: 16 MG/DL (ref 6–20)
Bun/Cre Ratio: 12 (ref 12–20)
CO2: 30 mmol/L (ref 21–32)
Calcium: 9.4 MG/DL (ref 8.5–10.1)
Chloride: 103 mmol/L (ref 97–108)
Creatinine: 1.36 MG/DL — ABNORMAL HIGH (ref 0.55–1.02)
Est, Glom Filt Rate: 43 mL/min/{1.73_m2} — ABNORMAL LOW (ref >60)
Globulin: 4.3 g/dL — ABNORMAL HIGH (ref 2.0–4.0)
Glucose: 123 mg/dL — ABNORMAL HIGH (ref 65–100)
Potassium: 3.8 mmol/L (ref 3.5–5.1)
Sodium: 138 mmol/L (ref 136–145)
Total Bilirubin: 2 MG/DL — ABNORMAL HIGH (ref 0.2–1.0)
Total Protein: 7.7 g/dL (ref 6.4–8.2)

## 2022-01-14 LAB — CBC WITH AUTO DIFFERENTIAL
Absolute Immature Granulocyte: 0 10*3/uL (ref 0.00–0.04)
Basophils %: 1 % (ref 0–1)
Basophils Absolute: 0.1 10*3/uL (ref 0.0–0.1)
Eosinophils %: 3 % (ref 0–7)
Eosinophils Absolute: 0.2 10*3/uL (ref 0.0–0.4)
Hematocrit: 32.9 % — ABNORMAL LOW (ref 35.0–47.0)
Hemoglobin: 10 g/dL — ABNORMAL LOW (ref 11.5–16.0)
Immature Granulocytes: 0 % (ref 0.0–0.5)
Lymphocytes %: 34 % (ref 12–49)
Lymphocytes Absolute: 2.1 10*3/uL (ref 0.8–3.5)
MCH: 24.3 PG — ABNORMAL LOW (ref 26.0–34.0)
MCHC: 30.4 g/dL (ref 30.0–36.5)
MCV: 79.9 FL — ABNORMAL LOW (ref 80.0–99.0)
MPV: 10.6 FL (ref 8.9–12.9)
Monocytes %: 6 % (ref 5–13)
Monocytes Absolute: 0.4 10*3/uL (ref 0.0–1.0)
Neutrophils %: 56 % (ref 32–75)
Neutrophils Absolute: 3.6 10*3/uL (ref 1.8–8.0)
Nucleated RBCs: 0 PER 100 WBC
Platelets: 143 10*3/uL — ABNORMAL LOW (ref 150–400)
RBC: 4.12 M/uL (ref 3.80–5.20)
RDW: 15.3 % — ABNORMAL HIGH (ref 11.5–14.5)
WBC: 6.4 10*3/uL (ref 3.6–11.0)
nRBC: 0 10*3/uL (ref 0.00–0.01)

## 2022-01-14 NOTE — ED Provider Notes (Signed)
[facility] EMERGENCY DEPT  EMERGENCY DEPARTMENT ENCOUNTER      Pt Name: Deborah [name]  MRN: [id number]  Birthdate [birth year]  Date of evaluation: 01/14/2022  Provider: Terrall Laity, MD      HISTORY OF PRESENT ILLNESS      66 year old female history of HFpEF, anxiety, CKD, morbid obesity presents to the emergency department chief complaint of chest pain intermittently for the past day.  She reports a sharp kind of chest pain in her left chest without radiation.  She has shortness of breath when the episodes occur.  No fever or cough.    The history is provided by the patient and medical records.           Nursing Notes were reviewed.    REVIEW OF SYSTEMS         Review of Systems        PAST MEDICAL HISTORY     Past Medical History:   Diagnosis Date    (HFpEF) heart failure with preserved ejection fraction (HCC)     Anxiety and depression     Aortic valve replaced     S/p bovine aortic valve replacement.    Asthma     Chronic narcotic use     Chronic obstructive pulmonary disease (HCC)     Chronic pain     CKD (chronic kidney disease), stage III (HCC)     Baseline creatinine is 1.3-1.4 with GFR in the 40s.    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)     GERD (gastroesophageal reflux disease)     History of vascular access device 04/13/2021    4 FR Single PICC for LTABX: R cephalic vessell length 48 CM Max P leave @ 1 CM out; Arm circumferenc 40 CM    Hyperlipidemia     Hypothyroidism     Morbid obesity (HCC)     Neuropathy     Obstructive sleep apnea     Rhinitis          SURGICAL HISTORY       Past Surgical History:   Procedure Laterality Date    AORTIC VALVE REPLACEMENT      Bovine bioprosthetic    CARDIAC PROCEDURE N/A 08/30/2021    Intracardiac echocardiogram performed by Thurston Pounds, MD at [facility] CARDIAC CATH LAB    COLONOSCOPY N/A 12/01/2020    COLONOSCOPY performed by Ian Malkin, MD at [facility] ENDOSCOPY    EP DEVICE PROCEDURE N/A 08/30/2021    Ablation PVC performed by Thurston Pounds, MD at [facility] CARDIAC CATH LAB    EP DEVICE  PROCEDURE N/A 08/30/2021    Drug stimulation performed by Thurston Pounds, MD at [facility] CARDIAC CATH LAB    EP DEVICE PROCEDURE N/A 08/30/2021    Transeptal puncture performed by Thurston Pounds, MD at [facility] CARDIAC CATH LAB    EP DEVICE PROCEDURE N/A 08/30/2021    Ep 3d mapping performed by Thurston Pounds, MD at [facility] CARDIAC CATH LAB    INVASIVE VASCULAR N/A 08/30/2021    Ultrasound guided vascular access performed by Thurston Pounds, MD at [facility] CARDIAC CATH LAB    PACEMAKER           CURRENT MEDICATIONS       Previous Medications    ALBUTEROL SULFATE HFA (PROVENTIL;VENTOLIN;PROAIR) 108 (90 BASE) MCG/ACT INHALER    Inhale 2 puffs into the lungs every 6 hours as needed    AMIODARONE (CORDARONE) 200 MG TABLET    Take 1 tablet by mouth daily  ASPIRIN 81 MG CHEWABLE TABLET    Take 1 tablet by mouth daily    ATORVASTATIN (LIPITOR) 10 MG TABLET    Take 1 tablet by mouth daily    BUMETANIDE (BUMEX) 1 MG TABLET    Take 1 tablet by mouth daily    CITALOPRAM (CELEXA) 40 MG TABLET    Take 1 tablet by mouth daily    FLUTICASONE (FLONASE) 50 MCG/ACT NASAL SPRAY    2 sprays by Nasal route daily as needed    GABAPENTIN (NEURONTIN) 100 MG CAPSULE    Take 1 capsule by mouth every 8 hours for 30 days. Max Daily Amount: 300 mg    GLIPIZIDE (GLUCOTROL XL) 5 MG EXTENDED RELEASE TABLET    Take 1 tablet by mouth daily    LEVOTHYROXINE (SYNTHROID) 150 MCG TABLET    Take 1 tablet by mouth every morning (before breakfast)    MICONAZOLE (MICOTIN) 2 % POWDER    Apply topically as needed    MIDODRINE (PROAMATINE) 10 MG TABLET    Take 1 tablet by mouth 3 times daily    MONTELUKAST (SINGULAIR) 10 MG TABLET    Take 1 tablet by mouth nightly    OMEPRAZOLE (PRILOSEC) 40 MG DELAYED RELEASE CAPSULE    Take 1 capsule by mouth daily    POLYETHYLENE GLYCOL (GLYCOLAX) 17 GM/SCOOP POWDER    Take 17 g by mouth daily as needed    POTASSIUM CHLORIDE (KLOR-CON M) 20 MEQ EXTENDED RELEASE TABLET    Take 1 tablet by mouth daily    PROBIOTIC PRODUCT (ACIDOPHILUS PROBIOTIC) CAPS CAPSULE     Take 1 capsule by mouth daily    SUCRALFATE (CARAFATE) 1 GM TABLET    Take 1 tablet by mouth 3 times daily    TRAZODONE (DESYREL) 100 MG TABLET    Take 1 tablet by mouth nightly       ALLERGIES     Nitroglycerin, Hydrochlorothiazide, Tetanus toxoids, and Aloe vera    FAMILY HISTORY       Family History   Problem Relation Age of Onset    Hypertension Mother     Hypertension Father           SOCIAL HISTORY       Social History     Socioeconomic History    Marital status: Widowed   Tobacco Use    Smoking status: Former     Current packs/day: 0.00     Types: Cigarettes     Quit date: 03/21/2010     Years since quitting: 11.8    Smokeless tobacco: Never   Vaping Use    Vaping Use: Never used   Substance and Sexual Activity    Alcohol use: Not Currently    Drug use: Never         PHYSICAL EXAM       ED Triage Vitals [01/14/22 1803]   BP Temp Temp Source Pulse Respirations SpO2 Height Weight - Scale   (!) 93/42 98.2 F (36.8 C) Oral 84 18 98 % 1.702 m (5\' 7" ) 136.1 kg (300 lb)       Body mass index is 46.99 kg/m.    Physical Exam  Vitals and nursing note reviewed.   Constitutional:       Appearance: She is obese.   Cardiovascular:      Rate and Rhythm: Normal rate.   Pulmonary:      Effort: No respiratory distress.   Neurological:      Mental Status: She  is alert.             EMERGENCY DEPARTMENT COURSE and DIFFERENTIAL DIAGNOSIS/MDM:   Vitals:    Vitals:    01/14/22 1803   BP: (!) 93/42   Pulse: 84   Resp: 18   Temp: 98.2 F (36.8 C)   TempSrc: Oral   SpO2: 98%   Weight: 136.1 kg (300 lb)   Height: 1.702 m (5\' 7" )         Medical Decision Making  66 year old female presents to the emergency department as above with a chief complaint of shortness of breath.  She was turned over to the oncoming physician pending delta troponin and CTA.  These tests are negative then she would be deemed very low risk for PE, ACS, dissection, pneumonia, PTX or other concerning pathology and could be safely discharged home.    8:36  PM  Change of shift.  Care of patient signed over to Dr Alona Bene.  Handoff complete.      Amount and/or Complexity of Data Reviewed  Labs: ordered.  Radiology: ordered and independent interpretation performed. Decision-making details documented in ED Course.  ECG/medicine tests: ordered.    Risk  Prescription drug management.            REASSESSMENT     ED Course as of 01/14/22 2035   Mon Jan 14, 2022   1755 ED EKG interpretation: Rhythm: Atrial paced. Rate: 70. Axis: Left axis deviation with left anterior fascicular block and LVH. ST Segment: No concerning ST segment changes. This EKG was interpreted realtime by Delman Cheadle MD, ED physician [JM]      ED Course User Index  [JM] Alton Revere, MD         CONSULTS:  None    PROCEDURES:     Procedures            (Please note that portions of this note were completed with a voice recognition program.  Efforts were made to edit the dictations but occasionally words are mis-transcribed.)    Alton Revere, MD (electronically signed)  Emergency Attending Physician              Alton Revere, MD  01/14/22 2037

## 2022-01-14 NOTE — ED Triage Notes (Signed)
Patient reports chest pain since yesterday at noon.

## 2022-01-15 LAB — EKG 12-LEAD
Atrial Rate: 70 {beats}/min
P Axis: 57 degrees
P-R Interval: 226 ms
Q-T Interval: 450 ms
QRS Duration: 118 ms
QTc Calculation (Bazett): 486 ms
R Axis: -58 degrees
T Axis: 78 degrees
Ventricular Rate: 70 {beats}/min

## 2022-01-15 LAB — TROPONIN: Troponin, High Sensitivity: 9 ng/L (ref 0–51)

## 2022-01-15 MED ORDER — IOPAMIDOL 76 % IV SOLN
76 % | Freq: Once | INTRAVENOUS | Status: AC | PRN
Start: 2022-01-15 — End: 2022-01-14
  Administered 2022-01-15: 02:00:00 100 mL via INTRAVENOUS

## 2022-01-15 MED FILL — ISOVUE-370 76 % IV SOLN: 76 % | INTRAVENOUS | Qty: 100

## 2022-03-13 ENCOUNTER — Encounter: Attending: Clinical Cardiac Electrophysiology | Primary: Family

## 2022-03-13 ENCOUNTER — Encounter: Primary: Family

## 2022-03-19 NOTE — Progress Notes (Signed)
No show

## 2022-04-25 NOTE — Telephone Encounter (Signed)
[facility] in [place] is calling because they need the images of the patient's echocardiogram for March and April.Please upload the images to the PACS system.The office said they send over multiple request for the images.    [phone] office [place] nurse [place] office

## 2022-06-13 ENCOUNTER — Encounter: Payer: MEDICARE | Primary: Family

## 2022-09-06 ENCOUNTER — Encounter: Payer: Self-pay | Admitting: Internal Medicine

## 2023-03-13 NOTE — Telephone Encounter (Addendum)
Confirm appointment tomorrow. Verify if patient is still under Dr. Loma Newton care or if she has transferred her care.    Placed a call to patient. There was no answer, left a message to return call.    Future Appointments   Date Time Provider Department Center   03/14/2023  2:40 PM PACEMAKER, [facility] AMB   03/14/2023  3:00 PM Jillyn Ledger, APRN - NP [facility] AMB

## 2023-03-14 ENCOUNTER — Encounter: Payer: MEDICARE | Attending: Nurse Practitioner | Primary: Family

## 2023-03-14 ENCOUNTER — Encounter: Payer: MEDICARE | Primary: Family

## 2023-03-14 DIAGNOSIS — Z8679 Personal history of other diseases of the circulatory system: Secondary | ICD-10-CM

## 2023-03-14 NOTE — Progress Notes (Deleted)
 [facility] Cardiology  Cardiac Electrophysiology Clinic Care Note                  [] Initial visit     [x] Established visit     Patient Name: Deborah [name] - DOB:Nov 11, 1955 - BJY:[id number]  Primary Cardiologist: Asencion Noble, MD  Electrophysiologist: Thurston Pounds, MD     Reason for visit: Follow up post PVC ablation    HPI:  [name] is a 68 y.o. female who presents for follow up, is s/p ablation of LV mid-apical anterior PVCs on 08/30/2021.    Post procedure, she continues amiodarone & mexiletine.  She reports continuing to feel poorly with SOB & lightheadedness, but she's been hospitalized with GI bleed & anemia earlier this month (see below for further details).    ECG today shows AP-VS 77 bpm with frequent PVCs, QTc 449 ms.    Echo in 04/2021 showed LVEF 60-65%, had bioprosthetic valve with regurgitant jet & possibly valve stenosis.    TEE in 03/2021 showed possible AS & possible echogenic structure on RA lead in the setting of bacteremia.  She was treated with antibiotics x 6 weeks, denies fevers/chills since then.    Nuclear stress test in 01/2021 showed no ischemia.    BP trends low, unremarkable today on midodrine.      Previous:  Admitted in 09/13/2021 at [facility] for GI bleed & acute on chronic COPD.  EGD showed gastropathy but no GI bleeding.  Admitted to use of Voltaren & Aleve concurrently; discharged with instruction to avoid NSAIDs entirely.  Thrombocytopenic (82) with Hgb 7.    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  Right groin hematoma noted at end of case.    Admitted with bacteremia, osteomyelitis, & possible vegetation on RA lead in 03/2021.  Completed 6 week course of antibiotics.    Holter in 08/2020 showed >30K PVCs.    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    Chronic thrombocytopenia.    Admitted with GI bleed in 2017, required 2 units PRBCs.    OSA on CPAP.       Assessment and Plan     PVCs: S/p ablation of LV mid-apical anterior  PVCs on 08/30/2021.  ECG today shows AP-VS 77 bpm with frequent PVCs & no significant QTc prolongation.  Will stop amiodarone due to recent cirrhosis diagnosis.  Will check holter for current PVC burden.  Will continue mexiletine for now.    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  Device check today shows proper lead & generator function.  Generator longevity estimated 1 year.  RA 83%, RV 64%.  No events, but she did have frequent PVCs noted on current EGM.    She had possible RA lead vegetation noted in 03/2021 in the setting of bacteremia.  No recurrent fever or chills since then, but she is concerned that lead may still have signs of vegetation & require extraction.  With likely generator change required later this year, will recheck TEE to determine whether there still appears to be any sign of vegetation; if so, she would require total system explant, which would require some additional planning & coordination of resources.  Will order TEE with MAC sedation.    S/p AVR: Bioprosthetic valve is calcified.  Continue to monitor via serial echocardiograms.    Hypotension: Controlled.  Continue midodrine.    Anemia: Recently admitted at [facility] with GI bleeding, had  Hgb 7 with chronic thrombocytopenia.  Recheck CBC.    Addendum  Holter:  PVC(s): Burden was 5.3 %, 7955 total PVC(s), 1 disparate morphologies  Ventricular Tachycardia: 5 events, longest event 6 beats at Day 4 / 12:39:26 am, fastest event 139 bpm at Day 3 /  08:56:13 pm     This is before mexiletine  PVC used to be 30K before ablation       Remote pacer checks q 3 months.  Follow up with Dr. Loma Newton in 6 months.  Follow up with Dr. Barry Dienes as previously scheduled.    Future Appointments   Date Time Provider Department Center   03/14/2023  2:40 PM PACEMAKER, [facility] AM[facility]   03/14/2023  3:00 PM Jillyn Ledger, APRN - NP [facility] AMB          ____________________________________________________________    Cardiac testing    05/11/21    TRANSTHORACIC ECHOCARDIOGRAM (TTE) COMPLETE (CONTRAST/BUBBLE/3D PRN) 05/22/2021 10:53 PM (Final)    Narrative  This is a summary report. The complete report is available in the patient's medical record. If you cannot access the medical record, please contact the sending organization for a detailed fax or copy.      Contrast used: Definity.    Technical qualifiers: Echo study was technically difficult with poor endocardial visualization, limited due to patient's condition, limited due to patient tolerance and technically difficult due to patient's heart rhythm.    Left Ventricle: Normal left ventricular systolic function with a visually estimated EF of 60 - 65%. Left ventricle size is normal. Mildly increased wall thickness. Unable to assess wall motion.    Aortic Valve: Not well visualized. Bioprosthetic valve. AV mean gradient is 34 mmHg. LVOT:AV VTI Index is 0.38.  With a mean PG 34 mmHg, DVI 0.38 and an intermediate jet counter, there is possibly some valve stenosis but likely not significant.    Signed by: Laddie Aquas, MD on 05/22/2021 10:53 PM        02/16/21    NM STRESS TEST WITH MYOCARDIAL PERFUSION 02/20/2021  4:57 PM (Final)    Narrative  This is a summary report. The complete report is available in the patient's medical record. If you cannot access the medical record, please contact the sending organization for a detailed fax or copy.      Nuclear Findings: LV perfusion is normal. There is no evidence of inducible ischemia.    Nuclear Findings: The defect appears to be caused by breast attenuation.    ECG: Resting ECG demonstrates normal sinus rhythm.    ECG: Stress ECG was negative for ischemia.    Stress Test: A pharmacological stress test was performed using lexiscan. Hemodynamics are adequate for diagnosis. Blood pressure demonstrated a normal response and heart rate demonstrated a normal response to  stress. The patient's heart rate recovery was normal. The patient reported dyspnea and no chest pain during the stress test.    Ventricular bigeminy  Unifocal PVC, RBBB and superior axis PVCs    Signed by: Thurston Pounds, MD on 02/20/2021  4:57 PM         No results found for this or any previous visit.           ECG: AP-VS 77 bpm with frequent PVCs.      Review of Systems    [x] All other systems reviewed and all negative except as written in HPI    []  Patient unable to provide secondary to condition  Patient Active Problem List   Diagnosis    Arm paresthesia, left    Thrombocytopenia (HCC)    Morbid obesity    CKD (chronic kidney disease), stage III (HCC)    Aortic valve replaced    Pacemaker    Asthma    Anxiety and depression    Chronic narcotic use    Chronic pain    Rhinitis    Hyperlipidemia    Neuropathy    GERD (gastroesophageal reflux disease)    CHF (congestive heart failure) (HCC)    Elevated bilirubin    Liver cirrhosis (HCC)    Colitis    Acute on chronic diastolic (congestive) heart failure (HCC)    PVC (premature ventricular contraction)    Systemic inflammatory response syndrome (SIRS) (HCC)    Acute on chronic heart failure with preserved ejection fraction (HFpEF) (HCC)    Hypothyroidism    Chronic respiratory failure with hypoxia    BMI 50.0-59.9, adult    Hypokalemia due to loss of potassium    DM (diabetes mellitus), type 2 with complications (HCC)    Acute on chronic respiratory failure with hypoxia and hypercapnia    OSA (obstructive sleep apnea)    COPD (chronic obstructive pulmonary disease) (HCC)    History of PSVT (paroxysmal supraventricular tachycardia)    Anemia    Hypomagnesemia    Anasarca    S/P ablation of ventricular arrhythmia    Recurrent falls    Lumbar spinal stenosis    Cervical spinal stenosis       Past Medical History:   Diagnosis Date    (HFpEF) heart failure with preserved ejection fraction (HCC)     Anxiety and depression     Aortic valve replaced     S/p bovine  aortic valve replacement.    Asthma     Chronic narcotic use     Chronic obstructive pulmonary disease (HCC)     Chronic pain     CKD (chronic kidney disease), stage III (HCC)     Baseline creatinine is 1.3-1.4 with GFR in the 40s.    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)     GERD (gastroesophageal reflux disease)     History of vascular access device 04/13/2021    4 FR Single PICC for LTABX: R cephalic vessell length 48 CM Max P leave @ 1 CM out; Arm circumferenc 40 CM    Hyperlipidemia     Hypothyroidism     Morbid obesity     Neuropathy     Obstructive sleep apnea     Rhinitis        Past Surgical History:   Procedure Laterality Date    AORTIC VALVE REPLACEMENT      Bovine bioprosthetic    CARDIAC PROCEDURE N/A 08/30/2021    Intracardiac echocardiogram performed by Thurston Pounds, MD at [facility] CARDIAC CATH LAB    COLONOSCOPY N/A 12/01/2020    COLONOSCOPY performed by Ian Malkin, MD at [facility] ENDOSCOPY    EP DEVICE PROCEDURE N/A 08/30/2021    Ablation PVC performed by Thurston Pounds, MD at [facility] CARDIAC CATH LAB    EP DEVICE PROCEDURE N/A 08/30/2021    Drug stimulation performed by Thurston Pounds, MD at [facility] CARDIAC CATH LAB    EP DEVICE PROCEDURE N/A 08/30/2021    Transeptal puncture performed by Thurston Pounds, MD at [facility] CARDIAC CATH LAB    EP DEVICE PROCEDURE N/A 08/30/2021    Ep 3d mapping performed by Thurston Pounds, MD at  [facility] CARDIAC CATH LAB    INVASIVE VASCULAR N/A 08/30/2021    Ultrasound guided vascular access performed by Thurston Pounds, MD at [facility] CARDIAC CATH LAB    PACEMAKER         Allergies   Allergen Reactions    Nitroglycerin      Other reaction(s): Unknown (comments)  hypotension    Hydrochlorothiazide Other (See Comments)     Reports 'kidneys dry up"     Tetanus Toxoids Swelling    Aloe Vera Rash       Social History     Tobacco Use    Smoking status: Former     Current packs/day: 0.00     Types: Cigarettes     Quit date: 03/21/2010     Years since quitting: 12.9    Smokeless tobacco: Never   Vaping Use    Vaping status: Never Used    Substance Use Topics    Alcohol use: Not Currently    Drug use: Never       Family History   Problem Relation Age of Onset    Hypertension Mother     Hypertension Father            OBJECTIVE:    Physical Exam    Vitals: There were no vitals filed for this visit.      General:    Alert, cooperative, no distress, appears stated age.   Neck:   Supple, no carotid bruit and no JVD.   Back:     Symmetric.   Lungs:     Clear to auscultation bilaterally.   Heart::    Regular rate and rhythm.  2/6 systolic murmur.  No click, rub or gallop.   Abdomen:     Soft, non-tender. Bowel sounds normal. Obese.   MSK:   Extremities normal, atraumatic.  Moves extremities independently.   Vasc/lymph:   No lower extremity edema.   Skin:   Skin color normal. No rashes or lesions on visible areas.   Neurologic:   Alert, moves all extremities.        Data Review:     Radiology:   XR Results (most recent):    CT Result (most recent):  CTA CHEST W WO CONTRAST 01/14/2022    Narrative  EXAM:  CTA CHEST W WO CONTRAST    INDICATION: PE    COMPARISON: 04/14/2021    TECHNIQUE: Helical thin section chest CT following intravenous administration of  nonionic contrast 100 mL of isovue 370 according to departmental PE protocol.  Coronal and sagittal reformats were performed. 3D post processing was performed.  CT dose reduction was achieved through the use of a standardized protocol  tailored for this examination and automatic exposure control for dose  modulation.    FINDINGS: This is a good quality study for the evaluation of pulmonary embolism  to the first subsegmental arterial level. There is no pulmonary embolism to this  level.    No axillary or supraclavicular adenopathy.  THYROID: No nodule.  MEDIASTINUM: No mass or lymphadenopathy.  HILA: No mass or lymphadenopathy.  THORACIC AORTA: Aberrant right subclavian artery.  HEART: Cardiomegaly. Aortic valve replacement. Pacer wires in the heart.  ESOPHAGUS: No wall thickening or  dilatation.  TRACHEA/BRONCHI: Patent.  PLEURA: No effusion or pneumothorax.  LUNGS: No nodule, mass, or airspace disease.  UPPER ABDOMEN: Liver cirrhosis  BONES: No aggressive bone lesion or fracture.    Impression  No evidence of pulmonary embolus. No acute cardiopulmonary process. Incidental  findings as above    MRI Result (most recent):  No results found for this or any previous visit from the past 3650 days.          Current meds:  Current Outpatient Medications   Medication Sig Dispense Refill    gabapentin (NEURONTIN) 100 MG capsule Take 1 capsule by mouth every 8 hours for 30 days. Max Daily Amount: 300 mg 90 capsule 0    bumetanide (BUMEX) 1 MG tablet Take 1 tablet by mouth daily 30 tablet 0    amiodarone (CORDARONE) 200 MG tablet Take 1 tablet by mouth daily      glipiZIDE (GLUCOTROL XL) 5 MG extended release tablet Take 1 tablet by mouth daily      omeprazole (PRILOSEC) 40 MG delayed release capsule Take 1 capsule by mouth daily      Probiotic Product (ACIDOPHILUS PROBIOTIC) CAPS capsule Take 1 capsule by mouth daily 30 capsule 1    potassium chloride (KLOR-CON M) 20 MEQ extended release tablet Take 1 tablet by mouth daily 30 tablet 1    midodrine (PROAMATINE) 10 MG tablet Take 1 tablet by mouth 3 times daily 90 tablet 1    albuterol sulfate HFA (PROVENTIL;VENTOLIN;PROAIR) 108 (90 Base) MCG/ACT inhaler Inhale 2 puffs into the lungs every 6 hours as needed      aspirin 81 MG chewable tablet Take 1 tablet by mouth daily      atorvastatin (LIPITOR) 10 MG tablet Take 1 tablet by mouth daily      citalopram (CELEXA) 40 MG tablet Take 1 tablet by mouth daily      fluticasone (FLONASE) 50 MCG/ACT nasal spray 2 sprays by Nasal route daily as needed      levothyroxine (SYNTHROID) 150 MCG tablet Take 1 tablet by mouth every morning (before breakfast)      miconazole (MICOTIN) 2 % powder Apply topically as needed      montelukast (SINGULAIR) 10 MG tablet Take 1 tablet by mouth nightly      polyethylene glycol  (GLYCOLAX) 17 GM/SCOOP powder Take 17 g by mouth daily as needed      sucralfate (CARAFATE) 1 GM tablet Take 1 tablet by mouth 3 times daily      traZODone (DESYREL) 100 MG tablet Take 1 tablet by mouth nightly       No current facility-administered medications for this visit.          Jillyn Ledger, APRN - NP  [facility] Card[facility]  [address]  [place], [place] [address]  [phone]      CC:Barrett, Lance Sell, APRN - NP
